# Patient Record
Sex: Female | Born: 1965 | Race: White | Hispanic: No | Marital: Single | Smoking: Former smoker
Health system: Southern US, Community
[De-identification: ages and names within clinical notes are randomized; demographics above are authoritative.]

## PROBLEM LIST (undated history)

## (undated) DIAGNOSIS — F32A Depression, unspecified: Secondary | ICD-10-CM

## (undated) DIAGNOSIS — F259 Schizoaffective disorder, unspecified: Secondary | ICD-10-CM

## (undated) DIAGNOSIS — F329 Major depressive disorder, single episode, unspecified: Secondary | ICD-10-CM

## (undated) DIAGNOSIS — F419 Anxiety disorder, unspecified: Secondary | ICD-10-CM

## (undated) DIAGNOSIS — J449 Chronic obstructive pulmonary disease, unspecified: Secondary | ICD-10-CM

## (undated) DIAGNOSIS — J45909 Unspecified asthma, uncomplicated: Secondary | ICD-10-CM

## (undated) DIAGNOSIS — F603 Borderline personality disorder: Secondary | ICD-10-CM

## (undated) DIAGNOSIS — K219 Gastro-esophageal reflux disease without esophagitis: Secondary | ICD-10-CM

## (undated) DIAGNOSIS — G3184 Mild cognitive impairment, so stated: Secondary | ICD-10-CM

## (undated) DIAGNOSIS — G809 Cerebral palsy, unspecified: Secondary | ICD-10-CM

## (undated) DIAGNOSIS — IMO0002 Reserved for concepts with insufficient information to code with codable children: Secondary | ICD-10-CM

## (undated) HISTORY — PX: CHOLECYSTECTOMY: SHX55

## (undated) HISTORY — PX: TONSILLECTOMY: SUR1361

## (undated) HISTORY — DX: Reserved for concepts with insufficient information to code with codable children: IMO0002

---

## 1997-12-29 ENCOUNTER — Encounter: Admission: RE | Admit: 1997-12-29 | Discharge: 1997-12-29 | Payer: Self-pay | Admitting: Family Medicine

## 1998-02-05 ENCOUNTER — Encounter: Admission: RE | Admit: 1998-02-05 | Discharge: 1998-02-05 | Payer: Self-pay | Admitting: Family Medicine

## 1998-03-05 ENCOUNTER — Encounter: Admission: RE | Admit: 1998-03-05 | Discharge: 1998-03-05 | Payer: Self-pay | Admitting: Family Medicine

## 1998-03-15 ENCOUNTER — Encounter: Admission: RE | Admit: 1998-03-15 | Discharge: 1998-03-15 | Payer: Self-pay | Admitting: Family Medicine

## 1998-03-23 ENCOUNTER — Encounter: Admission: RE | Admit: 1998-03-23 | Discharge: 1998-03-23 | Payer: Self-pay | Admitting: Sports Medicine

## 1998-04-21 ENCOUNTER — Encounter: Admission: RE | Admit: 1998-04-21 | Discharge: 1998-04-21 | Payer: Self-pay | Admitting: Sports Medicine

## 1998-05-13 ENCOUNTER — Encounter: Admission: RE | Admit: 1998-05-13 | Discharge: 1998-05-13 | Payer: Self-pay | Admitting: Family Medicine

## 1998-05-18 ENCOUNTER — Encounter: Admission: RE | Admit: 1998-05-18 | Discharge: 1998-05-18 | Payer: Self-pay | Admitting: Family Medicine

## 1998-05-25 ENCOUNTER — Encounter: Admission: RE | Admit: 1998-05-25 | Discharge: 1998-05-25 | Payer: Self-pay | Admitting: Family Medicine

## 1998-06-22 ENCOUNTER — Encounter: Admission: RE | Admit: 1998-06-22 | Discharge: 1998-06-22 | Payer: Self-pay | Admitting: Sports Medicine

## 1998-06-23 ENCOUNTER — Encounter: Admission: RE | Admit: 1998-06-23 | Discharge: 1998-06-23 | Payer: Self-pay | Admitting: Family Medicine

## 1998-07-15 ENCOUNTER — Encounter: Admission: RE | Admit: 1998-07-15 | Discharge: 1998-07-15 | Payer: Self-pay | Admitting: Family Medicine

## 1998-09-02 ENCOUNTER — Encounter: Admission: RE | Admit: 1998-09-02 | Discharge: 1998-09-02 | Payer: Self-pay | Admitting: Family Medicine

## 1998-09-20 ENCOUNTER — Encounter: Admission: RE | Admit: 1998-09-20 | Discharge: 1998-09-20 | Payer: Self-pay | Admitting: Family Medicine

## 1998-11-02 ENCOUNTER — Encounter: Admission: RE | Admit: 1998-11-02 | Discharge: 1998-11-02 | Payer: Self-pay | Admitting: Family Medicine

## 1998-11-10 ENCOUNTER — Encounter: Admission: RE | Admit: 1998-11-10 | Discharge: 1998-11-10 | Payer: Self-pay | Admitting: Family Medicine

## 1998-12-01 ENCOUNTER — Encounter: Admission: RE | Admit: 1998-12-01 | Discharge: 1998-12-01 | Payer: Self-pay | Admitting: Family Medicine

## 1998-12-07 ENCOUNTER — Encounter: Admission: RE | Admit: 1998-12-07 | Discharge: 1998-12-07 | Payer: Self-pay | Admitting: Family Medicine

## 1998-12-13 ENCOUNTER — Encounter: Admission: RE | Admit: 1998-12-13 | Discharge: 1998-12-13 | Payer: Self-pay | Admitting: Family Medicine

## 1999-01-14 ENCOUNTER — Encounter: Admission: RE | Admit: 1999-01-14 | Discharge: 1999-01-14 | Payer: Self-pay | Admitting: Family Medicine

## 2003-11-17 ENCOUNTER — Other Ambulatory Visit: Payer: Self-pay

## 2004-01-31 ENCOUNTER — Inpatient Hospital Stay: Payer: Self-pay | Admitting: Unknown Physician Specialty

## 2004-02-26 ENCOUNTER — Emergency Department: Payer: Self-pay | Admitting: Emergency Medicine

## 2004-05-02 ENCOUNTER — Emergency Department: Payer: Self-pay | Admitting: Emergency Medicine

## 2004-05-07 ENCOUNTER — Emergency Department: Payer: Self-pay | Admitting: Emergency Medicine

## 2004-06-05 ENCOUNTER — Emergency Department: Payer: Self-pay | Admitting: Emergency Medicine

## 2004-06-05 ENCOUNTER — Other Ambulatory Visit: Payer: Self-pay

## 2004-07-10 ENCOUNTER — Emergency Department: Payer: Self-pay | Admitting: Emergency Medicine

## 2004-08-03 ENCOUNTER — Emergency Department: Payer: Self-pay | Admitting: General Practice

## 2004-08-14 ENCOUNTER — Inpatient Hospital Stay: Payer: Self-pay | Admitting: Unknown Physician Specialty

## 2004-09-10 ENCOUNTER — Emergency Department: Payer: Self-pay | Admitting: Emergency Medicine

## 2004-09-12 ENCOUNTER — Ambulatory Visit: Payer: Self-pay | Admitting: Emergency Medicine

## 2004-09-27 ENCOUNTER — Emergency Department (HOSPITAL_COMMUNITY): Admission: EM | Admit: 2004-09-27 | Discharge: 2004-09-27 | Payer: Self-pay | Admitting: Emergency Medicine

## 2004-10-02 ENCOUNTER — Emergency Department: Payer: Self-pay | Admitting: Emergency Medicine

## 2004-10-16 ENCOUNTER — Emergency Department: Payer: Self-pay | Admitting: Emergency Medicine

## 2004-11-14 ENCOUNTER — Other Ambulatory Visit: Payer: Self-pay

## 2004-11-14 ENCOUNTER — Emergency Department: Payer: Self-pay | Admitting: Emergency Medicine

## 2004-11-28 ENCOUNTER — Emergency Department: Payer: Self-pay | Admitting: Emergency Medicine

## 2004-12-11 ENCOUNTER — Emergency Department: Payer: Self-pay | Admitting: Emergency Medicine

## 2005-01-02 ENCOUNTER — Other Ambulatory Visit: Payer: Self-pay

## 2005-01-02 ENCOUNTER — Emergency Department: Payer: Self-pay | Admitting: Internal Medicine

## 2005-01-04 ENCOUNTER — Emergency Department: Payer: Self-pay | Admitting: Emergency Medicine

## 2005-01-04 ENCOUNTER — Other Ambulatory Visit: Payer: Self-pay

## 2005-02-21 ENCOUNTER — Emergency Department: Payer: Self-pay | Admitting: Unknown Physician Specialty

## 2005-03-05 ENCOUNTER — Emergency Department: Payer: Self-pay | Admitting: Emergency Medicine

## 2005-04-20 ENCOUNTER — Emergency Department: Payer: Self-pay | Admitting: General Practice

## 2005-05-06 ENCOUNTER — Other Ambulatory Visit: Payer: Self-pay

## 2005-05-06 ENCOUNTER — Emergency Department: Payer: Self-pay | Admitting: Unknown Physician Specialty

## 2005-08-24 ENCOUNTER — Ambulatory Visit: Payer: Self-pay | Admitting: Internal Medicine

## 2005-08-28 ENCOUNTER — Ambulatory Visit: Payer: Self-pay | Admitting: Internal Medicine

## 2005-08-30 ENCOUNTER — Emergency Department: Payer: Self-pay | Admitting: Emergency Medicine

## 2005-09-13 ENCOUNTER — Emergency Department: Payer: Self-pay | Admitting: Emergency Medicine

## 2005-09-16 ENCOUNTER — Inpatient Hospital Stay: Payer: Self-pay | Admitting: Psychiatry

## 2005-11-26 ENCOUNTER — Emergency Department: Payer: Self-pay | Admitting: Unknown Physician Specialty

## 2006-01-06 ENCOUNTER — Inpatient Hospital Stay: Payer: Self-pay | Admitting: Psychiatry

## 2006-01-20 ENCOUNTER — Emergency Department: Payer: Self-pay | Admitting: Emergency Medicine

## 2006-01-20 ENCOUNTER — Other Ambulatory Visit: Payer: Self-pay

## 2006-02-25 ENCOUNTER — Emergency Department: Payer: Self-pay | Admitting: Unknown Physician Specialty

## 2006-03-06 ENCOUNTER — Emergency Department: Payer: Self-pay | Admitting: Internal Medicine

## 2006-03-09 ENCOUNTER — Emergency Department: Payer: Self-pay | Admitting: Emergency Medicine

## 2006-05-03 ENCOUNTER — Emergency Department: Payer: Self-pay | Admitting: Emergency Medicine

## 2006-05-08 ENCOUNTER — Ambulatory Visit: Payer: Self-pay | Admitting: Family Medicine

## 2006-06-08 ENCOUNTER — Emergency Department: Payer: Self-pay | Admitting: Emergency Medicine

## 2006-06-13 ENCOUNTER — Emergency Department: Payer: Self-pay | Admitting: General Practice

## 2006-06-24 ENCOUNTER — Inpatient Hospital Stay: Payer: Self-pay | Admitting: Unknown Physician Specialty

## 2006-09-02 ENCOUNTER — Ambulatory Visit: Payer: Self-pay | Admitting: Family Medicine

## 2006-09-25 ENCOUNTER — Ambulatory Visit: Payer: Self-pay | Admitting: Gastroenterology

## 2007-01-02 ENCOUNTER — Emergency Department: Payer: Self-pay | Admitting: Emergency Medicine

## 2007-01-30 ENCOUNTER — Ambulatory Visit: Payer: Self-pay | Admitting: Obstetrics and Gynecology

## 2007-02-01 ENCOUNTER — Ambulatory Visit: Payer: Self-pay | Admitting: Otolaryngology

## 2007-02-05 ENCOUNTER — Other Ambulatory Visit: Payer: Self-pay

## 2007-02-05 ENCOUNTER — Ambulatory Visit: Payer: Self-pay | Admitting: Otolaryngology

## 2007-02-07 ENCOUNTER — Ambulatory Visit: Payer: Self-pay | Admitting: Otolaryngology

## 2007-03-21 ENCOUNTER — Emergency Department: Payer: Self-pay | Admitting: Emergency Medicine

## 2007-03-21 ENCOUNTER — Other Ambulatory Visit: Payer: Self-pay

## 2007-04-12 ENCOUNTER — Emergency Department: Payer: Self-pay | Admitting: Emergency Medicine

## 2007-04-17 ENCOUNTER — Ambulatory Visit: Payer: Self-pay | Admitting: Otolaryngology

## 2007-04-18 ENCOUNTER — Emergency Department: Payer: Self-pay | Admitting: Emergency Medicine

## 2007-04-23 ENCOUNTER — Emergency Department: Payer: Self-pay | Admitting: Emergency Medicine

## 2007-05-19 ENCOUNTER — Other Ambulatory Visit: Payer: Self-pay

## 2007-05-19 ENCOUNTER — Emergency Department: Payer: Self-pay | Admitting: Emergency Medicine

## 2007-05-25 ENCOUNTER — Emergency Department: Payer: Self-pay | Admitting: Emergency Medicine

## 2007-06-03 ENCOUNTER — Other Ambulatory Visit: Payer: Self-pay

## 2007-06-03 ENCOUNTER — Emergency Department: Payer: Self-pay | Admitting: Emergency Medicine

## 2007-06-17 ENCOUNTER — Emergency Department: Payer: Self-pay | Admitting: Emergency Medicine

## 2007-07-11 ENCOUNTER — Other Ambulatory Visit: Payer: Self-pay

## 2007-07-11 ENCOUNTER — Inpatient Hospital Stay (HOSPITAL_COMMUNITY): Admission: AD | Admit: 2007-07-11 | Discharge: 2007-07-15 | Payer: Self-pay | Admitting: *Deleted

## 2007-07-16 ENCOUNTER — Ambulatory Visit: Payer: Self-pay | Admitting: *Deleted

## 2007-08-08 ENCOUNTER — Other Ambulatory Visit: Payer: Self-pay

## 2007-08-08 ENCOUNTER — Emergency Department: Payer: Self-pay | Admitting: Emergency Medicine

## 2007-08-10 ENCOUNTER — Emergency Department: Payer: Self-pay | Admitting: Emergency Medicine

## 2007-08-15 ENCOUNTER — Other Ambulatory Visit: Payer: Self-pay

## 2007-08-15 ENCOUNTER — Emergency Department: Payer: Self-pay | Admitting: Emergency Medicine

## 2007-08-20 ENCOUNTER — Emergency Department: Payer: Self-pay | Admitting: Emergency Medicine

## 2007-08-20 ENCOUNTER — Other Ambulatory Visit: Payer: Self-pay

## 2007-08-23 ENCOUNTER — Emergency Department: Payer: Self-pay | Admitting: Emergency Medicine

## 2007-08-25 ENCOUNTER — Inpatient Hospital Stay: Payer: Self-pay | Admitting: Unknown Physician Specialty

## 2007-09-08 ENCOUNTER — Emergency Department: Payer: Self-pay | Admitting: Emergency Medicine

## 2007-09-12 ENCOUNTER — Emergency Department: Payer: Self-pay | Admitting: Emergency Medicine

## 2007-10-10 ENCOUNTER — Ambulatory Visit: Payer: Self-pay | Admitting: Family Medicine

## 2007-10-22 ENCOUNTER — Ambulatory Visit: Payer: Self-pay | Admitting: General Surgery

## 2007-10-25 ENCOUNTER — Ambulatory Visit: Payer: Self-pay | Admitting: General Surgery

## 2007-11-30 ENCOUNTER — Emergency Department: Payer: Self-pay | Admitting: Emergency Medicine

## 2007-11-30 ENCOUNTER — Other Ambulatory Visit: Payer: Self-pay

## 2007-12-14 ENCOUNTER — Other Ambulatory Visit: Payer: Self-pay

## 2007-12-14 ENCOUNTER — Emergency Department: Payer: Self-pay | Admitting: Emergency Medicine

## 2008-01-08 ENCOUNTER — Emergency Department: Payer: Self-pay | Admitting: Emergency Medicine

## 2008-01-08 ENCOUNTER — Other Ambulatory Visit: Payer: Self-pay

## 2008-01-12 ENCOUNTER — Emergency Department: Payer: Self-pay | Admitting: Emergency Medicine

## 2008-02-07 ENCOUNTER — Emergency Department: Payer: Self-pay | Admitting: Emergency Medicine

## 2008-05-04 ENCOUNTER — Emergency Department: Payer: Self-pay

## 2008-05-06 ENCOUNTER — Ambulatory Visit: Payer: Self-pay | Admitting: Otolaryngology

## 2008-08-14 ENCOUNTER — Emergency Department: Payer: Self-pay | Admitting: Emergency Medicine

## 2008-09-02 ENCOUNTER — Emergency Department: Payer: Self-pay | Admitting: Internal Medicine

## 2008-09-12 ENCOUNTER — Emergency Department: Payer: Self-pay | Admitting: Emergency Medicine

## 2008-11-26 ENCOUNTER — Ambulatory Visit: Payer: Self-pay | Admitting: Gastroenterology

## 2008-12-16 ENCOUNTER — Ambulatory Visit: Payer: Self-pay | Admitting: Gastroenterology

## 2008-12-18 ENCOUNTER — Emergency Department: Payer: Self-pay | Admitting: Emergency Medicine

## 2009-02-16 ENCOUNTER — Emergency Department: Payer: Self-pay | Admitting: Emergency Medicine

## 2009-02-20 ENCOUNTER — Emergency Department: Payer: Self-pay | Admitting: Unknown Physician Specialty

## 2009-02-21 ENCOUNTER — Emergency Department: Payer: Self-pay | Admitting: Emergency Medicine

## 2009-03-04 ENCOUNTER — Emergency Department: Payer: Self-pay | Admitting: Emergency Medicine

## 2009-04-23 ENCOUNTER — Ambulatory Visit: Payer: Self-pay | Admitting: Gastroenterology

## 2009-07-19 ENCOUNTER — Ambulatory Visit: Payer: Self-pay | Admitting: Gastroenterology

## 2009-08-04 ENCOUNTER — Emergency Department: Payer: Self-pay | Admitting: Internal Medicine

## 2009-11-16 ENCOUNTER — Ambulatory Visit: Payer: Self-pay | Admitting: Internal Medicine

## 2010-08-30 NOTE — H&P (Signed)
[name], [name] NO.:  [id number]   MEDICAL RECORD NO.:  [id number]          PATIENT TYPE:  IPS   LOCATION:  0302                          FACILITY:  [facility]   PHYSICIAN:  Jasmine Pang, M.D. DATE OF BIRTH:  1965/05/14   DATE OF ADMISSION:  07/11/2007  DATE OF DISCHARGE:                       PSYCHIATRIC ADMISSION ASSESSMENT   HISTORY OF PRESENT ILLNESS:  The patient presents with a history of  depression, anxiety, and panic attacks.  The patient today states that  is what the problem was.  She states that she did have suicidal  thoughts with plan to overdose.  The patient reports her stressor was  that she had gotten upset because she was unable to go to school because  her case manager had to take a test to be a school bus driver and was  unable to transport her to school where patient is trying to get her  GED.  She states that otherwise she is doing well.  Her sleep and  appetite has been satisfactory.  She denies any hallucinations.  She has  been compliant with the medications and denies any drug use.   PAST PSYCHIATRIC HISTORY:  This is her first admission to [facility]  The patient was hospitalized at [facility] in the past.  She has had a case manager whose name is Manuela Neptune at phone number [phone] in [place].   SOCIAL HISTORY:  He is a 42-year female who lives with her fiancee who  she states is very supportive.  The patient is obtaining her GED.  The  patient is on disability.   FAMILY HISTORY:  Denies.   ALCOHOL AND DRUG HISTORY:  The patient denies any alcohol or drug use.  She is a nonsmoker.   PRIMARY CARE [facility]:  Dr. Lucina Mellow.   MEDICAL PROBLEMS:  She reports a history of asthma.   MEDICATIONS:  1. Abilify 50 mg daily.  2. Celexa 60 mg daily.  3. Lorazepam taking 1 daily p.r.n., 0.5 to 1 mg again daily p.r.n.   DRUG ALLERGIES:  PENICILLIN.  She reports problems with her stomach  getting upset.   PHYSICAL  EXAMINATION:  This is an overweight, unkempt, malodorous  female.  She is currently dressed in a hospital gown.  She was fully  assessed at [facility].  Temperature is 97.7, 98 heart rate, 20  respirations, blood pressure is 123/74.  She is 5 feet 4 inches tall,  215 pounds.   LABORATORY DATA:  Shows a white count of 11.3.  Urinalysis negative.  Alcohol level less than 5, potassium of 3.3, and her drug screen is  positive for benzodiazepines.   MENTAL STATUS:  Fully alert, cooperative female.  Seems somewhat  restless in a chair.  She has good eye contact.  Speech:  Mumbles every  now and had to reask questions.  Mood is neutral.  The patient's affect  is flat.  Thought processes are coherent.  No evidence of any psychotic  symptoms.  No suicidal or homicidal thoughts.  Cognitive function  intact.  She seems well aware of her self, situation,  and place.  Judgment insight is fair.  She seems to have limited cognitive  functioning.   AXIS I:  Mood disorder, not otherwise specified.  AXIS II:  Deferred.  AXIS III:  Asthma per patient history.  AXIS IV:  Problems with education, other psychosocial problems.  AXIS V:  Current is 40.   PLAN:  Is to contract for safety.  We will stabilize mood and thinking.  We will resume patient's Celexa, Abilify, and lorazepam having twice a  day p.r.n. dosing.  Will contact case manager for any concerns of  discharge planning.  Consider family session with her support group.  Her tentative length of stay is three to four days.      Landry Corporal, N.P.      Jasmine Pang, M.D.  Electronically Signed    JO/MEDQ  D:  07/12/2007  T:  07/13/2007  Job:  [id number]

## 2010-09-02 NOTE — Discharge Summary (Signed)
NAMEEVERLEE, QUAKENBUSH NO.:  [id number]   MEDICAL RECORD NO.:  [id number]          PATIENT TYPE:  IPS   LOCATION:  0302                          FACILITY:  [facility]   PHYSICIAN:  Jasmine Pang, M.D. DATE OF BIRTH:  13-May-1965   DATE OF ADMISSION:  07/11/2007  DATE OF DISCHARGE:  07/15/2007                               DISCHARGE SUMMARY   IDENTIFICATION:  The patient is a 45 year old female who was admitted on  July 11, 2007.   HISTORY OF PRESENT ILLNESS:  The patient presents with a history of  depression, anxiety, and panic attacks.  She states today that this is  what the main problem was.  She states that she did have suicidal  thoughts with plan to overdose.  The patient reports her stressor was  that she had gotten upset, because she was unable to go the school,  because her case manager had to take a test to be a school bus driver  and was unable to transport her to school where the patient was trying  to get her GED.  She states that otherwise she is doing well.  Her sleep  and appetite have been satisfactory.  She denies any hallucinations.  She has been compliant with medications and denies any drug use.   PAST PSYCHIATRIC HISTORY:  This is her first admission to [facility].  The patient was hospitalized at [facility] in the past.   FAMILY HISTORY:  Denies.   ALCOHOL AND DRUG HISTORY:  The patient denies any alcohol or drug use.  She is a nonsmoker.   MEDICAL PROBLEMS:  Reports a history of asthma.   MEDICATIONS:  1. Abilify 50 mg daily.  2. Celexa 60 mg daily.  3. Lorazepam 0.5 mg to 1 mg p.r.n. anxiety daily.   DRUG ALLERGIES:  PENICILLIN.   The patient reports problems with her stomach getting upset.   PHYSICAL FINDINGS:  There were no acute physical or medical problems  noted.  She was fully assessed at the [facility] ED.   LABORATORY DATA:  White blood cell count was 11.3.  Urinalysis was  negative.  Alcohol level less  than 5.  Potassium was 30.3.  UDS positive  for benzodiazepines.   HOSPITAL COURSE:  Upon admission, the patient was restarted on her  Abilify 15 mg p.o. q.day.  She was also placed on trazodone 50 mg p.o.  q.h.s. p.r.n. insomnia.  She was started on Protonix 40 mg p.o. q.day  and albuterol inhaler for rescue shortness of breath.  She was placed on  Ativan 0.5 mg p.o. b.i.d. p.r.n. anxiety and Celexa 60 mg p.o. q.day.  In sessions with me, the patient was reserved, but cooperative.  She  states that she was feeling suicidal.  She was upset at a case worker.  She has been feeling depressed and anxious.  She sees a psychiatrist,  Dr. Elesa Massed, in [place].  She has been hospitalized before at [facility] in [place].  She states, she has a supportive  fiance with whom she lives.  As hospitalization progressed, she became  less depressed and less anxious.  She tolerated her medications well  with no significant side effects.  She participated appropriately in  unit, therapeutic groups, and activities.  She was focused on wanting to  go home.  She discussed how her schedule has been changed by her  outpatient case manager and she does not like this.  On July 15, 2007,  the patient was anxious to go home.  Sleep was good, appetite was good.  Mood was euthymic.  Affect consistent with mood.  There was no suicidal  or homicidal ideation.  No thoughts of self-injurious behavior.  No  auditory or visual hallucinations.  No paranoia or delusions.  Thoughts  were logical and goal-directed.  Thought content, no predominant theme.  Cognitive was grossly back to baseline.  It was felt the patient was  safe for discharge.   DISCHARGE DIAGNOSES:  Axis I:  Mood disorder, not otherwise specified.  Axis II:  None.  Axis III:  Asthma.  Axis IV:  Moderate (problems with education, other psychosocial  problems, burden with psychiatric illness).  Axis V:  Global assessment of functioning  was 50 upon discharge.  GAF  was 40 upon admission.  GAF highest past year was 60-65.   DISCHARGE PLANS:  There was no specific activity level or dietary  restrictions.   POSTHOSPITAL CARE PLANS:  The patient will see Dr. Elesa Massed at the [facility] on July 17, 2007, at 10:30 a.m.  She will continue to  work with her community support case Production designer, theatre/television/film, [employer].   DISCHARGE MEDICATIONS:  1. Albuterol p.r.n. for asthma as directed.  2. Abilify 15 mg daily.  3. Celexa 60 mg daily.  4. Protonix 40 mg daily.      Jasmine Pang, M.D.  Electronically Signed     BHS/MEDQ  D:  08/11/2007  T:  08/12/2007  Job:  [id number]

## 2010-10-25 ENCOUNTER — Ambulatory Visit: Payer: Self-pay | Admitting: Internal Medicine

## 2010-11-27 ENCOUNTER — Emergency Department: Payer: Self-pay | Admitting: Emergency Medicine

## 2011-01-09 LAB — BASIC METABOLIC PANEL
CO2: 26
Chloride: 108
Creatinine, Ser: 0.75
GFR calc Af Amer: >60
Potassium: 3.3 — ABNORMAL LOW
Sodium: 140

## 2011-01-09 LAB — URINALYSIS, ROUTINE W REFLEX MICROSCOPIC
Bilirubin Urine: NEGATIVE
Glucose, UA: NEGATIVE
Hgb urine dipstick: NEGATIVE
Specific Gravity, Urine: 1.03
Urobilinogen, UA: 0.2
pH: 5.5

## 2011-01-09 LAB — CBC
Hemoglobin: 14.2
MCV: 92.5
RBC: 4.69
WBC: 11.3 — ABNORMAL HIGH

## 2011-01-09 LAB — DIFFERENTIAL
Eosinophils Absolute: 0.2
Lymphs Abs: 2.8
Monocytes Absolute: 0.7
Monocytes Relative: 6
Neutrophils Relative %: 66

## 2011-01-09 LAB — ETHANOL: Alcohol, Ethyl (B): <5

## 2011-01-09 LAB — RAPID URINE DRUG SCREEN, HOSP PERFORMED
Barbiturates: NOT DETECTED
Opiates: NOT DETECTED

## 2011-06-02 ENCOUNTER — Emergency Department: Payer: Self-pay | Admitting: Emergency Medicine

## 2011-07-04 ENCOUNTER — Emergency Department: Payer: Self-pay | Admitting: Emergency Medicine

## 2011-07-04 LAB — CK TOTAL AND CKMB (NOT AT ARMC)
CK, Total: 155 U/L (ref 21–215)
CK, Total: 148 U/L (ref 21–215)
CK-MB: 1.4 ng/mL (ref 0.5–3.6)
CK-MB: 1.2 ng/mL (ref 0.5–3.6)

## 2011-07-04 LAB — BASIC METABOLIC PANEL
Calcium, Total: 8.7 mg/dL (ref 8.5–10.1)
Chloride: 110 mmol/L — ABNORMAL HIGH (ref 98–107)
Co2: 23 mmol/L (ref 21–32)
Creatinine: 0.59 mg/dL — ABNORMAL LOW (ref 0.60–1.30)
EGFR (Non-African Amer.): >60
Potassium: 4.1 mmol/L (ref 3.5–5.1)
Sodium: 147 mmol/L — ABNORMAL HIGH (ref 136–145)

## 2011-07-04 LAB — CBC
HCT: 39.5 % (ref 35.0–47.0)
MCH: 32.2 pg (ref 26.0–34.0)
Platelet: 260 10*3/uL (ref 150–440)
RBC: 4.14 10*6/uL (ref 3.80–5.20)
RDW: 13.2 % (ref 11.5–14.5)

## 2011-07-04 LAB — TROPONIN I: Troponin-I: <0.02 ng/mL

## 2011-08-09 LAB — COMPREHENSIVE METABOLIC PANEL
Albumin: 4.1 g/dL (ref 3.4–5.0)
Alkaline Phosphatase: 67 U/L (ref 50–136)
Chloride: 107 mmol/L (ref 98–107)
Co2: 25 mmol/L (ref 21–32)
Creatinine: 0.67 mg/dL (ref 0.60–1.30)
EGFR (African American): >60
EGFR (Non-African Amer.): >60
SGOT(AST): 33 U/L (ref 15–37)
Total Protein: 7.9 g/dL (ref 6.4–8.2)

## 2011-08-09 LAB — CBC
HCT: 41.1 % (ref 35.0–47.0)
HGB: 13.7 g/dL (ref 12.0–16.0)
MCHC: 33.4 g/dL (ref 32.0–36.0)
RBC: 4.34 10*6/uL (ref 3.80–5.20)
RDW: 13.3 % (ref 11.5–14.5)
WBC: 9.3 10*3/uL (ref 3.6–11.0)

## 2011-08-10 ENCOUNTER — Inpatient Hospital Stay: Payer: Self-pay | Admitting: Psychiatry

## 2011-08-10 DIAGNOSIS — R9431 Abnormal electrocardiogram [ECG] [EKG]: Secondary | ICD-10-CM

## 2011-08-10 LAB — DRUG SCREEN, URINE
Amphetamines, Ur Screen: NEGATIVE (ref ?–1000)
Barbiturates, Ur Screen: NEGATIVE (ref ?–200)
Cannabinoid 50 Ng, Ur ~~LOC~~: NEGATIVE (ref ?–50)
MDMA (Ecstasy)Ur Screen: NEGATIVE (ref ?–500)
Phencyclidine (PCP) Ur S: NEGATIVE (ref ?–25)
Tricyclic, Ur Screen: NEGATIVE (ref ?–1000)

## 2011-08-11 LAB — FOLATE: Folic Acid: 19 ng/mL (ref 3.1–100.0)

## 2011-10-24 ENCOUNTER — Inpatient Hospital Stay: Payer: Self-pay | Admitting: Psychiatry

## 2011-10-24 LAB — DRUG SCREEN, URINE
Amphetamines, Ur Screen: NEGATIVE (ref ?–1000)
Barbiturates, Ur Screen: NEGATIVE (ref ?–200)
Methadone, Ur Screen: NEGATIVE (ref ?–300)
Opiate, Ur Screen: NEGATIVE (ref ?–300)
Phencyclidine (PCP) Ur S: NEGATIVE (ref ?–25)

## 2011-10-24 LAB — URINALYSIS, COMPLETE
Ketone: NEGATIVE
Nitrite: NEGATIVE
Ph: 5 (ref 4.5–8.0)
Protein: NEGATIVE
RBC,UR: 1 /HPF (ref 0–5)
Specific Gravity: 1.019 (ref 1.003–1.030)
Squamous Epithelial: 12

## 2011-10-24 LAB — COMPREHENSIVE METABOLIC PANEL
Albumin: 3.6 g/dL (ref 3.4–5.0)
Alkaline Phosphatase: 81 U/L (ref 50–136)
BUN: 11 mg/dL (ref 7–18)
Calcium, Total: 9 mg/dL (ref 8.5–10.1)
EGFR (African American): >60
EGFR (Non-African Amer.): >60
Glucose: 100 mg/dL — ABNORMAL HIGH (ref 65–99)
Osmolality: 284 (ref 275–301)
Potassium: 3.6 mmol/L (ref 3.5–5.1)
SGOT(AST): 34 U/L (ref 15–37)
SGPT (ALT): 44 U/L
Total Protein: 7.6 g/dL (ref 6.4–8.2)

## 2011-10-24 LAB — CBC
HCT: 40.6 % (ref 35.0–47.0)
HGB: 13.8 g/dL (ref 12.0–16.0)
MCHC: 33.9 g/dL (ref 32.0–36.0)
MCV: 94 fL (ref 80–100)
RBC: 4.32 10*6/uL (ref 3.80–5.20)
RDW: 13.6 % (ref 11.5–14.5)

## 2011-10-24 LAB — ETHANOL
Ethanol %: <0.003 % (ref 0.000–0.080)
Ethanol: <3 mg/dL

## 2011-10-25 LAB — BEHAVIORAL MEDICINE 1 PANEL
Albumin: 3.4 g/dL (ref 3.4–5.0)
Alkaline Phosphatase: 85 U/L (ref 50–136)
Anion Gap: 10 (ref 7–16)
BUN: 16 mg/dL (ref 7–18)
Basophil #: 0.1 10*3/uL (ref 0.0–0.1)
Bilirubin,Total: 0.3 mg/dL (ref 0.2–1.0)
Calcium, Total: 8.9 mg/dL (ref 8.5–10.1)
Co2: 24 mmol/L (ref 21–32)
Creatinine: 0.65 mg/dL (ref 0.60–1.30)
Eosinophil #: 0.3 10*3/uL (ref 0.0–0.7)
Eosinophil %: 3.1 %
Glucose: 94 mg/dL (ref 65–99)
Lymphocyte #: 2.4 10*3/uL (ref 1.0–3.6)
Monocyte %: 10.2 %
Neutrophil %: 60.8 %
Osmolality: 282 (ref 275–301)
Platelet: 291 10*3/uL (ref 150–440)
Potassium: 3.9 mmol/L (ref 3.5–5.1)
RBC: 4.19 10*6/uL (ref 3.80–5.20)
SGOT(AST): 27 U/L (ref 15–37)
Sodium: 141 mmol/L (ref 136–145)
Thyroid Stimulating Horm: 6.48 u[IU]/mL — ABNORMAL HIGH
WBC: 9.7 10*3/uL (ref 3.6–11.0)

## 2012-05-27 ENCOUNTER — Ambulatory Visit: Payer: Self-pay | Admitting: Internal Medicine

## 2013-01-23 ENCOUNTER — Encounter (HOSPITAL_COMMUNITY): Payer: Self-pay | Admitting: Emergency Medicine

## 2013-01-23 ENCOUNTER — Emergency Department (HOSPITAL_COMMUNITY): Payer: No Typology Code available for payment source

## 2013-01-23 ENCOUNTER — Emergency Department (HOSPITAL_COMMUNITY)
Admission: EM | Admit: 2013-01-23 | Discharge: 2013-01-23 | Disposition: A | Discharge status: Home / Self Care | Payer: No Typology Code available for payment source | Attending: Emergency Medicine | Admitting: Emergency Medicine

## 2013-01-23 DIAGNOSIS — J3489 Other specified disorders of nose and nasal sinuses: Secondary | ICD-10-CM | POA: Diagnosis not present

## 2013-01-23 DIAGNOSIS — Y939 Activity, unspecified: Secondary | ICD-10-CM | POA: Insufficient documentation

## 2013-01-23 DIAGNOSIS — Y92410 Unspecified street and highway as the place of occurrence of the external cause: Secondary | ICD-10-CM | POA: Insufficient documentation

## 2013-01-23 DIAGNOSIS — S139XXA Sprain of joints and ligaments of unspecified parts of neck, initial encounter: Secondary | ICD-10-CM | POA: Diagnosis not present

## 2013-01-23 DIAGNOSIS — S0990XA Unspecified injury of head, initial encounter: Secondary | ICD-10-CM | POA: Diagnosis not present

## 2013-01-23 DIAGNOSIS — J45909 Unspecified asthma, uncomplicated: Secondary | ICD-10-CM | POA: Diagnosis not present

## 2013-01-23 DIAGNOSIS — S46909A Unspecified injury of unspecified muscle, fascia and tendon at shoulder and upper arm level, unspecified arm, initial encounter: Secondary | ICD-10-CM | POA: Insufficient documentation

## 2013-01-23 DIAGNOSIS — R059 Cough, unspecified: Secondary | ICD-10-CM | POA: Insufficient documentation

## 2013-01-23 DIAGNOSIS — S4980XA Other specified injuries of shoulder and upper arm, unspecified arm, initial encounter: Secondary | ICD-10-CM | POA: Insufficient documentation

## 2013-01-23 DIAGNOSIS — S0993XA Unspecified injury of face, initial encounter: Secondary | ICD-10-CM | POA: Diagnosis present

## 2013-01-23 DIAGNOSIS — R05 Cough: Secondary | ICD-10-CM | POA: Insufficient documentation

## 2013-01-23 DIAGNOSIS — Z880 Allergy status to penicillin: Secondary | ICD-10-CM | POA: Insufficient documentation

## 2013-01-23 DIAGNOSIS — S161XXA Strain of muscle, fascia and tendon at neck level, initial encounter: Secondary | ICD-10-CM

## 2013-01-23 HISTORY — DX: Unspecified asthma, uncomplicated: J45.909

## 2013-01-23 MED ORDER — HYDROCODONE-ACETAMINOPHEN 5-325 MG PO TABS
1.0000 | ORAL_TABLET | Freq: Once | ORAL | Status: DC
  Filled 2013-01-23: qty 1

## 2013-01-23 MED ORDER — METHOCARBAMOL 500 MG PO TABS: 500.0000 mg | ORAL_TABLET | Freq: Three times a day (TID) | ORAL | Status: DC | PRN

## 2013-01-23 MED ORDER — NAPROXEN 500 MG PO TABS: 500.0000 mg | ORAL_TABLET | Freq: Two times a day (BID) | ORAL | Status: DC

## 2013-01-23 NOTE — ED Notes (Signed)
Patient with no complaints at this time. Respirations even and unlabored. Skin warm/dry. Discharge instructions reviewed with patient at this time. Patient given opportunity to voice concerns/ask questions. Patient discharged at this time and left Emergency Department with steady gait.   

## 2013-01-23 NOTE — ED Notes (Signed)
Patient/family refused hydrocodone due to "it will make her sleepy."
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
## 2013-10-10 ENCOUNTER — Emergency Department: Payer: Self-pay | Admitting: Emergency Medicine

## 2013-10-10 LAB — CBC
HCT: 39.4 % (ref 35.0–47.0)
HGB: 13.6 g/dL (ref 12.0–16.0)
MCH: 33 pg (ref 26.0–34.0)
MCHC: 34.6 g/dL (ref 32.0–36.0)
MCV: 95 fL (ref 80–100)
Platelet: 292 10*3/uL (ref 150–440)
RBC: 4.14 10*6/uL (ref 3.80–5.20)
RDW: 13.2 % (ref 11.5–14.5)
WBC: 9.8 10*3/uL (ref 3.6–11.0)

## 2013-10-10 LAB — COMPREHENSIVE METABOLIC PANEL
ANION GAP: 5 — AB (ref 7–16)
Albumin: 3.7 g/dL (ref 3.4–5.0)
Alkaline Phosphatase: 96 U/L
BUN: 16 mg/dL (ref 7–18)
Bilirubin,Total: 0.3 mg/dL (ref 0.2–1.0)
CALCIUM: 9.3 mg/dL (ref 8.5–10.1)
Chloride: 109 mmol/L — ABNORMAL HIGH (ref 98–107)
Co2: 28 mmol/L (ref 21–32)
Creatinine: 0.72 mg/dL (ref 0.60–1.30)
EGFR (African American): >60
Glucose: 101 mg/dL — ABNORMAL HIGH (ref 65–99)
Osmolality: 284 (ref 275–301)
Potassium: 3.5 mmol/L (ref 3.5–5.1)
SGOT(AST): 19 U/L (ref 15–37)
SGPT (ALT): 26 U/L (ref 12–78)
Sodium: 142 mmol/L (ref 136–145)
Total Protein: 7.5 g/dL (ref 6.4–8.2)

## 2013-10-10 LAB — SALICYLATE LEVEL: Salicylates, Serum: 2.9 mg/dL — ABNORMAL HIGH

## 2013-10-10 LAB — ACETAMINOPHEN LEVEL: Acetaminophen: <2 ug/mL

## 2013-10-10 LAB — ETHANOL: Ethanol %: <0.003 % (ref 0.000–0.080)

## 2013-12-19 ENCOUNTER — Emergency Department: Payer: Self-pay | Admitting: Emergency Medicine

## 2013-12-19 LAB — BASIC METABOLIC PANEL
Anion Gap: 7 (ref 7–16)
BUN: 11 mg/dL (ref 7–18)
CALCIUM: 9.1 mg/dL (ref 8.5–10.1)
CHLORIDE: 109 mmol/L — AB (ref 98–107)
Co2: 24 mmol/L (ref 21–32)
Creatinine: 0.79 mg/dL (ref 0.60–1.30)
EGFR (African American): >60
Glucose: 91 mg/dL (ref 65–99)
OSMOLALITY: 278 (ref 275–301)
Potassium: 3.4 mmol/L — ABNORMAL LOW (ref 3.5–5.1)
Sodium: 140 mmol/L (ref 136–145)

## 2013-12-19 LAB — CBC
HCT: 36.7 % (ref 35.0–47.0)
HGB: 12.5 g/dL (ref 12.0–16.0)
MCH: 32.4 pg (ref 26.0–34.0)
MCHC: 34.1 g/dL (ref 32.0–36.0)
MCV: 95 fL (ref 80–100)
Platelet: 241 10*3/uL (ref 150–440)
RBC: 3.87 10*6/uL (ref 3.80–5.20)
RDW: 13.3 % (ref 11.5–14.5)
WBC: 6.1 10*3/uL (ref 3.6–11.0)

## 2013-12-19 LAB — TROPONIN I: Troponin-I: <0.02 ng/mL

## 2014-04-02 ENCOUNTER — Emergency Department: Payer: Self-pay | Admitting: Student

## 2014-04-02 LAB — COMPREHENSIVE METABOLIC PANEL
ALBUMIN: 3.2 g/dL — AB (ref 3.4–5.0)
Alkaline Phosphatase: 61 U/L
Anion Gap: 7 (ref 7–16)
BUN: 13 mg/dL (ref 7–18)
Bilirubin,Total: 0.2 mg/dL (ref 0.2–1.0)
CALCIUM: 8.4 mg/dL — AB (ref 8.5–10.1)
Chloride: 110 mmol/L — ABNORMAL HIGH (ref 98–107)
Co2: 28 mmol/L (ref 21–32)
Creatinine: 0.75 mg/dL (ref 0.60–1.30)
EGFR (African American): >60
EGFR (Non-African Amer.): >60
Glucose: 92 mg/dL (ref 65–99)
OSMOLALITY: 288 (ref 275–301)
Potassium: 3.7 mmol/L (ref 3.5–5.1)
SGOT(AST): 32 U/L (ref 15–37)
SGPT (ALT): 46 U/L
Sodium: 145 mmol/L (ref 136–145)
Total Protein: 6.8 g/dL (ref 6.4–8.2)

## 2014-04-02 LAB — CBC
HCT: 37.4 % (ref 35.0–47.0)
HGB: 12.4 g/dL (ref 12.0–16.0)
MCH: 32 pg (ref 26.0–34.0)
MCHC: 33.1 g/dL (ref 32.0–36.0)
MCV: 97 fL (ref 80–100)
Platelet: 283 10*3/uL (ref 150–440)
RBC: 3.87 10*6/uL (ref 3.80–5.20)
RDW: 13.7 % (ref 11.5–14.5)
WBC: 8.1 10*3/uL (ref 3.6–11.0)

## 2014-04-02 LAB — PROTIME-INR
INR: 1
Prothrombin Time: 13.1 secs (ref 11.5–14.7)

## 2014-04-02 LAB — APTT: ACTIVATED PTT: 28.7 s (ref 23.6–35.9)

## 2014-04-02 LAB — HCG, QUANTITATIVE, PREGNANCY

## 2014-04-02 LAB — TROPONIN I: Troponin-I: <0.02 ng/mL

## 2014-07-30 ENCOUNTER — Ambulatory Visit
Admit: 2014-07-30 | Disposition: A | Discharge status: Home / Self Care | Payer: Self-pay | Attending: Internal Medicine | Admitting: Internal Medicine

## 2014-08-04 NOTE — Discharge Summary (Signed)
PATIENT NAME:  [name], [name] MR#:  [id number] DATE OF BIRTH:  08-05-1965  DATE OF ADMISSION:  10/24/2011 DATE OF DISCHARGE:  10/27/2011  HISTORY OF PRESENT ILLNESS:  [name] presented to the Emergency Department with depressed mood and suicidal thoughts. She had been experiencing over two weeks of  anhedonia, poor concentration, and thoughts of hopelessness and helplessness, as well as disturbance of appetite, insomnia, and suicidal thoughts. There were no precipitating factors identified. She was taking her psychotropic medication including Abilify 10 mg daily, buspirone 10 mg b.i.d., and clonazepam 0.5 mg b.i.d.   See the admission dictation.   ANCILLARY CLINICAL DATA: The patient was continued on her Nexium 40 mg daily for her gastroesophageal reflux.  HOSPITAL COURSE: [name] was admitted to the inpatient behavioral health unit and underwent milieu and group psychotherapy. She was given Abilify 10 mg at bedtime for anti-depression augmentation and antipsychosis. She was continued on buspirone 15 mg 2 times a day. This medication had been utilized as a primary antidepressant for her after medications other than BuSpar had either failed or resulted in intolerable adverse effects.   She was continued on Klonopin at 0.5 mg b.i.d. for antianxiety, which was effective.     She participated well in milieu and group psychotherapy. She progressively improved.   CONDITION ON DISCHARGE: By 07/12 [name] has normal mood and interests. She has constructive future goals and intact hope. She is having no adverse medication effects. She is socially appropriate.   MENTAL STATUS EXAM UPON DISCHARGE:  [name] is alert. Her eye contact is good. Her abstraction is intact. She is oriented to all spheres. Her memory is intact to immediate, recent, and remote. Her fund of knowledge, use of language, and intelligence are normal. Her speech involves normal rate and prosody. Thought process is logical,  coherent, and goal-directed. No looseness of associations. Thought content: No thoughts of harming herself or others. No delusions or hallucinations. Affect broad and appropriate. Mood within normal limits. Insight intact. Judgment intact.   DISCHARGE DIAGNOSES:  AXIS I:  1. Major depressive disorder, recurrent, in clinical remission.  The patient had some brief reactive depressive symptoms which quickly resolved.  2. Anxiety disorder, not otherwise specified.   AXIS II: Deferred.   AXIS III: Gastroesophageal reflux.  AXIS IV: Primary support group.   AXIS V: 55.   [name] is not at risk to harm himself or others. She agrees to call emergency services immediately for any thoughts of harming herself, thoughts of harming others, or distress. She agrees to not drive if drowsy.   DISCHARGE MEDICATIONS:  1. Clonazepam 0.5 mg, 1 b.i.d.  2. Milk of Magnesia 8% oral suspension, 30 mL at bedtime for constipation.  3. Buspirone 15 mg 2 times a day.  4. Nexium 40 mg daily. 5. Abilify 10 mg daily.  6. Albuterol inhaler 2 puffs q. 6 hours p.r.n. wheezing.  DIET: Regular.   ACTIVITY: Routine.   FOLLOWUP:  1. Dr. Cherylann RatelLateef,  [facility] on 10/31/2011 at 9:30. 2. Roe RutherfordLindsay Tucker with [facility] visiting at the group home.    3. Dan EuropeKelly Morgan [facility].  Tresa EndoKelly will be visiting in the group home.     ____________________________ Adelene AmasJames S. Nuh Lipton, MD jsw:bjt D: 11/02/2011 22:53:06 ET T: 11/03/2011 07:54:58 ET JOB#: [id number]  cc: Adelene AmasJames S. Darsha Zumstein, MD, <Dictator> Lester CarolinaJAMES S Glendia Olshefski MD ELECTRONICALLY SIGNED 11/07/2011 23:31

## 2014-08-09 NOTE — H&P (Signed)
PATIENT NAME:  AMI, MALLY MR#:  [id number] DATE OF BIRTH:  06-20-1965  DATE OF ADMISSION:  10/24/2011  CHIEF COMPLAINT AND IDENTIFYING DATA: Ms. Cherie Lasalle is a 49 year old female presenting with depressed mood and suicidal thoughts.   HISTORY OF PRESENT ILLNESS: Ms. Amaral has been experiencing depression for over two weeks including anhedonia, poor concentration, thoughts of hopelessness, helplessness, disturbance of appetite, insomnia, and suicidal thoughts.   She does not describe any precipitating factors. She has not been able to use any psychosocial means to decrease her symptoms.   She did state that a surge of suicidal ideation came on to her on the day of presentation which prompted her to be taken to the Emergency Room. She does not have any hallucinations. Her orientation and memory function are intact. She is socially cooperative and not violent.   She has been taking her psychotropic medication Abilify 10 mg daily, buspirone 10 mg b.i.d, and clonazepam 0.5 mg b.i.d.   PAST PSYCHIATRIC HISTORY: Ms. Grenier does have a history of prior depressive episodes and has been admitted to [facility] as well as [facility]. She has been diagnosed with bipolar disorder.   FAMILY PSYCHIATRIC HISTORY: None known.   SOCIAL HISTORY: Ms. Bronkema lives in the [facility]. She does not use alcohol or illegal drugs.   She has no legal history of arrests or incarceration.   PAST MEDICAL HISTORY: Gastroesophageal reflux disease.   ALLERGIES: Penicillin.  LABORATORY DATA: TSH on the 9th was normal. Hepatic function panel normal. Chemistry panel unremarkable. Ethanol negative. Urine drug screen negative. CBC unremarkable.   REVIEW OF SYSTEMS: Constitutional, HEENT, mouth, neurologic, psychiatric, cardiovascular, respiratory, gastrointestinal, genitourinary, skin, musculoskeletal, hematologic, lymphatic, endocrine, metabolic all  unremarkable.   PHYSICAL EXAMINATION:   VITAL SIGNS: Temperature 96.7, pulse 94, respiratory rate 20, blood pressure 128/100.   The patient was examined with female psychiatric staff escort.   GENERAL APPEARANCE: Ms. Morgano is a well developed, well nourished middle-aged female sitting on her hospital bed with no abnormal involuntary movement. She has no cachexia. Her muscle tone is normal. Her grooming and hygiene are normal.   HEENT: Head normocephalic, atraumatic. Pupils equally round and reactive light and accommodation. Oropharynx clear without erythema.   EXTREMITIES: No cyanosis, clubbing, or edema.   SKIN: Normal turgor. No rashes.   NECK: Supple, nontender. No masses.   LUNGS: Clear to auscultation. No wheezing, rhonchi, or rales.   CARDIOVASCULAR: Regular rate and rhythm. No murmurs, rubs, or gallops.   ABDOMEN: Nondistended. Bowel sounds positive. Soft, nontender.   GENITOURINARY: Deferred.   NEUROLOGIC: Cranial nerves II through XII intact. General sensory intact throughout to light touch. Motor 5/5 strength throughout. Deep tendon reflexes normal strength and symmetry throughout. No Babinski. Coordination intact by finger-to-nose bilaterally.   MENTAL STATUS EXAMINATION: Ms. Whitehair is alert. Her eye contact is good. Concentration is decreased. She is oriented to all spheres. Memory is intact to immediate, recent, and remote. Fund of knowledge, use of language, and intelligence mildly below average. Speech involves regular rate and rhythm. Normal prosody. There is a slight baseline dysarthria. Thought process logical, coherent, and goal directed. No looseness of associations or tangents. Thought content she does have suicidal ideation. She contracts for safety in the hospital. She has no thoughts of harming others. She has no hallucinations or delusions. Affect is very constricted. She is crying. Mood is depressed. Insight is marginal. Judgment is impaired.   She discusses  how she  is grieving that a family member had to go in the hospital for nephrolithiasis.   ASSESSMENT:  AXIS I: Bipolar I disorder, depressed.   AXIS II: Deferred.   AXIS III: Gastroesophageal reflux disease.   AXIS IV: Primary support group.   AXIS V: 30.   Ms. Mccarver is at risk for suicide.   PLAN:  1. Therefore, will admit Ms. Perham to the inpatient Behavioral Health Unit. She will undergo milieu and group psychotherapy.  2. For now she will be continued on her current psychotropic medication and will be monitored for altering as appropriate. Her Abilify will be continued for anti-psychosis and as an acute mood stabilizer. She will likely need either alteration of her mood stabilizer and/or the addition of a more effective antidepressant.    ____________________________ Adelene AmasJames S. Mont Jagoda, MD jsw:drc D: 10/25/2011 19:13:10 ET T: 10/26/2011 06:47:54 ET JOB#: [id number]  cc: Adelene AmasJames S. Casimira Sutphin, MD, <Dictator> Lester CarolinaJAMES S Leelynn Whetsel MD ELECTRONICALLY SIGNED 10/30/2011 10:33
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
## 2014-08-31 ENCOUNTER — Emergency Department
Admission: EM | Admit: 2014-08-31 | Discharge: 2014-08-31 | Disposition: A | Discharge status: Home / Self Care | Payer: Medicare Other | Attending: Emergency Medicine | Admitting: Emergency Medicine

## 2014-08-31 DIAGNOSIS — F329 Major depressive disorder, single episode, unspecified: Secondary | ICD-10-CM | POA: Diagnosis not present

## 2014-08-31 DIAGNOSIS — Z3202 Encounter for pregnancy test, result negative: Secondary | ICD-10-CM | POA: Insufficient documentation

## 2014-08-31 DIAGNOSIS — F32A Depression, unspecified: Secondary | ICD-10-CM

## 2014-08-31 DIAGNOSIS — Z880 Allergy status to penicillin: Secondary | ICD-10-CM | POA: Insufficient documentation

## 2014-08-31 DIAGNOSIS — G809 Cerebral palsy, unspecified: Secondary | ICD-10-CM

## 2014-08-31 DIAGNOSIS — R45851 Suicidal ideations: Secondary | ICD-10-CM | POA: Diagnosis present

## 2014-08-31 LAB — URINE DRUG SCREEN, QUALITATIVE (ARMC ONLY)
Amphetamines, Ur Screen: NOT DETECTED
BARBITURATES, UR SCREEN: NOT DETECTED
BENZODIAZEPINE, UR SCRN: NOT DETECTED
CANNABINOID 50 NG, UR ~~LOC~~: NOT DETECTED
Cocaine Metabolite,Ur ~~LOC~~: NOT DETECTED
MDMA (Ecstasy)Ur Screen: NOT DETECTED
Methadone Scn, Ur: NOT DETECTED
OPIATE, UR SCREEN: NOT DETECTED
PHENCYCLIDINE (PCP) UR S: NOT DETECTED
TRICYCLIC, UR SCREEN: NOT DETECTED

## 2014-08-31 LAB — COMPREHENSIVE METABOLIC PANEL
ALBUMIN: 3.8 g/dL (ref 3.5–5.0)
ALT: 18 U/L (ref 14–54)
ANION GAP: 7 (ref 5–15)
AST: 22 U/L (ref 15–41)
Alkaline Phosphatase: 45 U/L (ref 38–126)
BILIRUBIN TOTAL: 0.4 mg/dL (ref 0.3–1.2)
BUN: 16 mg/dL (ref 6–20)
CO2: 28 mmol/L (ref 22–32)
CREATININE: 0.8 mg/dL (ref 0.44–1.00)
Calcium: 8.9 mg/dL (ref 8.9–10.3)
Chloride: 108 mmol/L (ref 101–111)
GFR calc Af Amer: >60 mL/min (ref >60)
Glucose, Bld: 111 mg/dL — ABNORMAL HIGH (ref 65–99)
Potassium: 4 mmol/L (ref 3.5–5.1)
Sodium: 143 mmol/L (ref 135–145)
Total Protein: 6.8 g/dL (ref 6.5–8.1)

## 2014-08-31 LAB — URINALYSIS COMPLETE WITH MICROSCOPIC (ARMC ONLY)
BILIRUBIN URINE: NEGATIVE
Glucose, UA: NEGATIVE mg/dL
Ketones, ur: NEGATIVE mg/dL
Nitrite: NEGATIVE
Protein, ur: NEGATIVE mg/dL
Specific Gravity, Urine: 1.015 (ref 1.005–1.030)
pH: 5 (ref 5.0–8.0)

## 2014-08-31 LAB — CBC
HCT: 39.2 % (ref 35.0–47.0)
Hemoglobin: 13 g/dL (ref 12.0–16.0)
MCH: 31.7 pg (ref 26.0–34.0)
MCHC: 33.1 g/dL (ref 32.0–36.0)
MCV: 95.5 fL (ref 80.0–100.0)
Platelets: 273 10*3/uL (ref 150–440)
RBC: 4.11 MIL/uL (ref 3.80–5.20)
RDW: 13.8 % (ref 11.5–14.5)
WBC: 7.9 10*3/uL (ref 3.6–11.0)

## 2014-08-31 LAB — SALICYLATE LEVEL: Salicylate Lvl: <4 mg/dL (ref 2.8–30.0)

## 2014-08-31 LAB — POCT PREGNANCY, URINE: Preg Test, Ur: NEGATIVE

## 2014-08-31 LAB — ETHANOL

## 2014-08-31 LAB — ACETAMINOPHEN LEVEL: Acetaminophen (Tylenol), Serum: <10 ug/mL — ABNORMAL LOW (ref 10–30)

## 2014-08-31 NOTE — Consult Note (Signed)
[facility] Consult   Reason for Consult:  49 year old woman with a history of mental retardation and cerebral palsy who presented to the emergency room with a chief complaint "I'm suicidal" consult for actual evaluation of psychiatric needs Referring Physician:  goodman [name] MRN:  [id number] Principal Diagnosis: Adjustment disorder with mixed anxiety and depressed mood Diagnosis:   Patient Active Problem List   Diagnosis Date Noted  . Adjustment disorder with mixed anxiety and depressed mood [F43.23] 08/31/2014  . 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 Cerebral palsy [G80.9] 08/31/2014    Total Time spent with patient: 1 hour  Subjective:   [name] is a 49 y.o. female patient admitted with "I'm suicidal". On closer examination she doesn't actually mean that she wants to die or has any plans to do anything to harm herself. She is frustrated and upset that her brother didn't take her to visit their mother this weekend. See full history of present illness.Marland Kitchen  HPI:  Patient told the staff at her group home that she was suicidal yesterday and they brought her into the emergency room. Patient tells me "I'm suicidal" but when I actually ask her if she wanted to die or had any plans to try and kill herself she said no. It turns out she is feeling anxious and sad because her brother didn't take her to visit their mother this weekend. Patient claims that the mother is sick and might be dying and she wanted to go visit her in [place]. She had been counting on her brother to take her and he let her down. Patient's mood is been feeling anxious and sad more than usual the last couple days. Sleep is been about the same as usual. Appetite about the same as usual. Denies any hallucinations. Denies any wish to harm anyone else. She says she's been compliant with her medicines although she doesn't know what they are. She has not been  drinking or abusing any drugs.  Past psychiatric history of similar kinds of presentations. Patient is mentally retarded with cerebral palsy and has presented in the past similarly. She says that she made 1 suicide attempt when she was a teenager but none since then. It's been years since she was in a psychiatric hospital.  Social history is that she resides in a group home. She has siblings but it sounds like her relationship with him is on and off. From the way she describes it it sounds like the one brother who does pay attention to her she tends to call excessively and then alienate. Married no children herself.  Medical history positive for cerebral palsy  Family history negative as far as we know  Substance abuse history: Patient denies any current or past drug or alcohol abuse HPI Elements:   Quality:  Sad and frustrated mood. Severity:  Moderate. Timing:  Worse in the last few days. Duration:  Up-and-down especially bad after a disappointment. Context:  Brother didn't take her to [place] this weekend.  Past Medical History:  Past Medical History  Diagnosis Date  . Asthma     Past Surgical History  Procedure Laterality Date  . Cholecystectomy    . Tonsillectomy     Family History: No family history on file. Social History:  History  Alcohol Use No     History  Drug Use No    History   Social History  . Marital Status: Single    Spouse Name: N/A  . Number  of Children: N/A  . Years of Education: N/A   Social History Main Topics  . Smoking status: Never Smoker   . Smokeless tobacco: Not on file  . Alcohol Use: No  . Drug Use: No  . Sexual Activity: Not on file   Other Topics Concern  . Not on file   Social History Narrative  . No narrative on file   Additional Social History:    Pain Medications: None reported Prescriptions: None reported Over the Counter: None reported History of alcohol / drug use?: No history of alcohol / drug abuse Longest  period of sobriety (when/how long): None reported Negative Consequences of Use:  (None reported) Withdrawal Symptoms:  (None reported)                     Allergies:   Allergies  Allergen Reactions  . Penicillins Other (See Comments)    Pt states that she is "just scared to take it".      Labs:  Results for orders placed or performed during the hospital encounter of 08/31/14 (from the past 48 hour(s))  Acetaminophen level     Status: Abnormal   Collection Time: 08/31/14  1:13 PM  Result Value Ref Range   Acetaminophen (Tylenol), Serum <10 (L) 10 - 30 ug/mL    Comment:        THERAPEUTIC CONCENTRATIONS VARY SIGNIFICANTLY. A RANGE OF 10-30 ug/mL MAY BE AN EFFECTIVE CONCENTRATION FOR MANY PATIENTS. HOWEVER, SOME ARE BEST TREATED AT CONCENTRATIONS OUTSIDE THIS RANGE. ACETAMINOPHEN CONCENTRATIONS >150 ug/mL AT 4 HOURS AFTER INGESTION AND >50 ug/mL AT 12 HOURS AFTER INGESTION ARE OFTEN ASSOCIATED WITH TOXIC REACTIONS.   Ethanol (ETOH)     Status: None   Collection Time: 08/31/14  1:13 PM  Result Value Ref Range   Alcohol, Ethyl (B) <5 <5 mg/dL    Comment:        LOWEST DETECTABLE LIMIT FOR SERUM ALCOHOL IS 11 mg/dL FOR MEDICAL PURPOSES ONLY   Salicylate level     Status: None   Collection Time: 08/31/14  1:13 PM  Result Value Ref Range   Salicylate Lvl <3.9 2.8 - 30.0 mg/dL  CBC     Status: None   Collection Time: 08/31/14  1:39 PM  Result Value Ref Range   WBC 7.9 3.6 - 11.0 K/uL   RBC 4.11 3.80 - 5.20 MIL/uL   Hemoglobin 13.0 12.0 - 16.0 g/dL   HCT 39.2 35.0 - 47.0 %   MCV 95.5 80.0 - 100.0 fL   MCH 31.7 26.0 - 34.0 pg   MCHC 33.1 32.0 - 36.0 g/dL   RDW 13.8 11.5 - 14.5 %   Platelets 273 150 - 440 K/uL  Comprehensive metabolic panel     Status: Abnormal   Collection Time: 08/31/14  1:39 PM  Result Value Ref Range   Sodium 143 135 - 145 mmol/L   Potassium 4.0 3.5 - 5.1 mmol/L   Chloride 108 101 - 111 mmol/L   CO2 28 22 - 32 mmol/L   Glucose, Bld 111  (H) 65 - 99 mg/dL   BUN 16 6 - 20 mg/dL   Creatinine, Ser 0.80 0.44 - 1.00 mg/dL   Calcium 8.9 8.9 - 10.3 mg/dL   Total Protein 6.8 6.5 - 8.1 g/dL   Albumin 3.8 3.5 - 5.0 g/dL   AST 22 15 - 41 U/L   ALT 18 14 - 54 U/L   Alkaline Phosphatase 45 38 - 126 U/L  Total Bilirubin 0.4 0.3 - 1.2 mg/dL   GFR calc non Af Amer >60 >60 mL/min   GFR calc Af Amer >60 >60 mL/min    Comment: (NOTE) The eGFR has been calculated using the CKD EPI equation. This calculation has not been validated in all clinical situations. eGFR's persistently <60 mL/min signify possible Chronic Kidney Disease.    Anion gap 7 5 - 15  Urinalysis complete, with microscopic [facility])     Status: Abnormal   Collection Time: 08/31/14  1:39 PM  Result Value Ref Range   Color, Urine YELLOW (A) YELLOW   APPearance HAZY (A) CLEAR   Glucose, UA NEGATIVE NEGATIVE mg/dL   Bilirubin Urine NEGATIVE NEGATIVE   Ketones, ur NEGATIVE NEGATIVE mg/dL   Specific Gravity, Urine 1.015 1.005 - 1.030   Hgb urine dipstick 2+ (A) NEGATIVE   pH 5.0 5.0 - 8.0   Protein, ur NEGATIVE NEGATIVE mg/dL   Nitrite NEGATIVE NEGATIVE   Leukocytes, UA 1+ (A) NEGATIVE   RBC / HPF 0-5 0 - 5 RBC/hpf   WBC, UA 0-5 0 - 5 WBC/hpf   Bacteria, UA RARE (A) NONE SEEN   Squamous Epithelial / LPF 6-30 (A) NONE SEEN   Mucous PRESENT   Urine Drug Screen, Qualitative [facility])     Status: None   Collection Time: 08/31/14  1:39 PM  Result Value Ref Range   Tricyclic, Ur Screen NONE DETECTED NONE DETECTED   Amphetamines, Ur Screen NONE DETECTED NONE DETECTED   MDMA (Ecstasy)Ur Screen NONE DETECTED NONE DETECTED   Cocaine Metabolite,Ur [place] NONE DETECTED NONE DETECTED   Opiate, Ur Screen NONE DETECTED NONE DETECTED   Phencyclidine (PCP) Ur S NONE DETECTED NONE DETECTED   Cannabinoid 50 Ng, Ur  NONE DETECTED NONE DETECTED   Barbiturates, Ur Screen NONE DETECTED NONE DETECTED   Benzodiazepine, Ur Scrn NONE DETECTED NONE DETECTED   Methadone Scn, Ur NONE DETECTED NONE  DETECTED    Comment: (NOTE) 470  Tricyclics, urine               Cutoff 1000 ng/mL 200  Amphetamines, urine             Cutoff 1000 ng/mL 300  MDMA (Ecstasy), urine           Cutoff 500 ng/mL 400  Cocaine Metabolite, urine       Cutoff 300 ng/mL 500  Opiate, urine                   Cutoff 300 ng/mL 600  Phencyclidine (PCP), urine      Cutoff 25 ng/mL 700  Cannabinoid, urine              Cutoff 50 ng/mL 800  Barbiturates, urine             Cutoff 200 ng/mL 900  Benzodiazepine, urine           Cutoff 200 ng/mL 1000 Methadone, urine                Cutoff 300 ng/mL 1100 1200 The urine drug screen provides only a preliminary, unconfirmed 1300 analytical test result and should not be used for non-medical 1400 purposes. Clinical consideration and professional judgment should 1500 be applied to any positive drug screen result due to possible 1600 interfering substances. A more specific alternate chemical method 1700 must be used in order to obtain a confirmed analytical result.  1800 Gas chromato graphy / mass spectrometry (GC/MS) is the preferred 1900 confirmatory  method.   Pregnancy, urine POC     Status: None   Collection Time: 08/31/14  1:48 PM  Result Value Ref Range   Preg Test, Ur NEGATIVE NEGATIVE    Comment:        THE SENSITIVITY OF THIS METHODOLOGY IS >24 mIU/mL     Vitals: Blood pressure 111/69.  Risk to Self:   Risk to Others:   Prior Inpatient Therapy:   Prior Outpatient Therapy:    No current facility-administered medications for this encounter.   Current Outpatient Prescriptions  Medication Sig Dispense Refill  . albuterol (PROVENTIL HFA;VENTOLIN HFA) 108 (90 BASE) MCG/ACT inhaler Inhale 2 puffs into the lungs every 4 (four) hours as needed for wheezing or shortness of breath.    . ARIPiprazole (ABILIFY) 20 MG tablet Take 20 mg by mouth daily.    . busPIRone (BUSPAR) 10 MG tablet Take 10 mg by mouth 3 (three) times daily.    . clonazePAM (KLONOPIN) 0.5 MG tablet  Take 0.5 mg by mouth 2 (two) times daily.    Marland Kitchen escitalopram (LEXAPRO) 10 MG tablet Take 10 mg by mouth at bedtime.    . fenofibrate 54 MG tablet Take 54 mg by mouth daily.    . fluticasone (FLONASE) 50 MCG/ACT nasal spray Place 1-2 sprays into both nostrils daily.    . Fluticasone-Salmeterol (ADVAIR) 100-50 MCG/DOSE AEPB Inhale 1 puff into the lungs 2 (two) times daily.    . Hypromellose (ARTIFICIAL TEARS OP) Apply 2 drops to eye 3 (three) times daily.    . meloxicam (MOBIC) 15 MG tablet Take 15 mg by mouth daily.    . pantoprazole (PROTONIX) 40 MG tablet Take 40 mg by mouth daily.    Marland Kitchen Umeclidinium Bromide 62.5 MCG/INH AEPB Inhale 1 puff into the lungs daily.    . methocarbamol (ROBAXIN) 500 MG tablet Take 1 tablet (500 mg total) by mouth 3 (three) times daily between meals as needed. (Patient not taking: Reported on 08/31/2014) 20 tablet 0  . naproxen (NAPROSYN) 500 MG tablet Take 1 tablet (500 mg total) by mouth 2 (two) times daily. (Patient not taking: Reported on 08/31/2014) 30 tablet 0    Musculoskeletal: Strength & Muscle Tone: spastic Gait & Station: unsteady Patient leans: N/A  Psychiatric Specialty Exam: Physical Exam  Constitutional: She appears well-developed and well-nourished.  HENT:  Head: Normocephalic and atraumatic.  Eyes: Conjunctivae are normal. Pupils are equal, round, and reactive to light.  Neck: Normal range of motion.  Cardiovascular: Normal heart sounds.   Respiratory: Effort normal.  GI: Soft.  Musculoskeletal: Normal range of motion.  Neurological: She is alert.  Skin: Skin is warm and dry.  Psychiatric: Her behavior is normal. Her mood appears anxious. Her speech is delayed and tangential. Thought content is not paranoid. Cognition and memory are impaired. She expresses impulsivity. She exhibits a depressed mood. She expresses no homicidal and no suicidal ideation. She expresses no suicidal plans and no homicidal plans. She exhibits abnormal recent memory.     Review of Systems  Constitutional: Negative.   HENT: Negative.   Eyes: Negative.   Respiratory: Negative.   Cardiovascular: Negative.   Gastrointestinal: Negative.   Musculoskeletal: Negative.   Skin: Negative.   Neurological: Negative.   Psychiatric/Behavioral: Positive for depression. Negative for suicidal ideas, hallucinations and substance abuse. The patient is nervous/anxious. The patient does not have insomnia.     Blood pressure 111/69.There is no weight on file to calculate BMI.  General Appearance: Disheveled  Eye Contact::  Good  Speech:  Blocked and Slow  Volume:  Decreased  Mood:  Euthymic  Affect:  Flat  Thought Process:  Circumstantial  Orientation:  Full (Time, Place, and Person)  Thought Content:  Negative  Suicidal Thoughts:  No  Homicidal Thoughts:  No  Memory:  Immediate;   Good Recent;   Fair Remote;   Fair  Judgement:  Impaired  Insight:  Lacking  Psychomotor Activity:  Negative  Concentration:  Fair  Recall:  [employer] of Knowledge:Fair  Language: Fair  Akathisia:  No  Handed:  Right  AIMS (if indicated):     Assets:  Financial Resources/Insurance Housing Intimacy  ADL's:  Intact  Cognition: Impaired,  Moderate  Sleep:      Medical Decision Making: New Problem, with no additional work-up planned (3), Review of Last Therapy Session (1), Review or order medicine tests (1) and Review of Medication Regimen & Side Effects (2)  Treatment Plan Summary: Plan Patient does not require inpatient hospitalization. Although she used the term suicidal" she doesn't actually have any wish to die or thoughts of doing anything to harm herself. She is able to articulate positive things about her life and things she is looking forward to. Patient has an appropriate place to live and outpatient care in place. At this point I don't think she meets commitment criteria. Involuntary commitment discontinued. Patient given supportive counseling and encouraged to talk with  her family and staff about her frustrations. No new prescriptions written. Case discussed with emergency room physician and psychiatric team  Plan:  Patient does not meet criteria for psychiatric inpatient admission. Supportive therapy provided about ongoing stressors. Disposition: Discontinue IVC recommend released from the emergency room  Weber Cooks [facility] 08/31/2014 6:24 PM

## 2014-08-31 NOTE — ED Provider Notes (Signed)
[facility] Emergency Department Provider Note    ____________________________________________  Time seen: 1535  I have reviewed the triage vital signs and the nursing notes.   HISTORY  Chief Complaint Suicidal   History limited by: Not Limited   HPI [name] is a 49 y.o. female who presents to the emergency department today from group home because of concerns for suicidal ideation. Patient states that she has been having a side for the past 3 days. She states that this was triggered by a family matter. She feels that no one cares about her. She has a plan to jump into traffic. She states she has had to have hospitalizations for SI in the past and has sent tried to hurt herself in the past. Patient states she has had some hallucinations that are telling her to hurt herself. The patient denies missing any of her psychiatric medications. Patient denies any fevers, chest pain, shortness of breath.     Past Medical History  Diagnosis Date  . Asthma     Patient Active Problem List   Diagnosis Date Noted  . Adjustment disorder with mixed anxiety and depressed mood 08/31/2014  . 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 Cerebral palsy 08/31/2014    Past Surgical History  Procedure Laterality Date  . Cholecystectomy    . Tonsillectomy      Current Outpatient Rx  Name  Route  Sig  Dispense  Refill  . albuterol (PROVENTIL HFA;VENTOLIN HFA) 108 (90 BASE) MCG/ACT inhaler   Inhalation   Inhale 2 puffs into the lungs every 4 (four) hours as needed for wheezing or shortness of breath.         . ARIPiprazole (ABILIFY) 20 MG tablet   Oral   Take 20 mg by mouth daily.         . busPIRone (BUSPAR) 10 MG tablet   Oral   Take 10 mg by mouth 3 (three) times daily.         . clonazePAM (KLONOPIN) 0.5 MG tablet   Oral   Take 0.5 mg by mouth 2 (two) times daily.         Marland Kitchen. escitalopram (LEXAPRO) 10 MG tablet   Oral   Take 10 mg by  mouth at bedtime.         . fenofibrate 54 MG tablet   Oral   Take 54 mg by mouth daily.         . fluticasone (FLONASE) 50 MCG/ACT nasal spray   Each Nare   Place 1-2 sprays into both nostrils daily.         . Fluticasone-Salmeterol (ADVAIR) 100-50 MCG/DOSE AEPB   Inhalation   Inhale 1 puff into the lungs 2 (two) times daily.         . Hypromellose (ARTIFICIAL TEARS OP)   Ophthalmic   Apply 2 drops to eye 3 (three) times daily.         . meloxicam (MOBIC) 15 MG tablet   Oral   Take 15 mg by mouth daily.         . pantoprazole (PROTONIX) 40 MG tablet   Oral   Take 40 mg by mouth daily.         Marland Kitchen. Umeclidinium Bromide 62.5 MCG/INH AEPB   Inhalation   Inhale 1 puff into the lungs daily.         . methocarbamol (ROBAXIN) 500 MG tablet   Oral   Take 1 tablet (500 mg total) by  mouth 3 (three) times daily between meals as needed. Patient not taking: Reported on 08/31/2014   20 tablet   0   . naproxen (NAPROSYN) 500 MG tablet   Oral   Take 1 tablet (500 mg total) by mouth 2 (two) times daily. Patient not taking: Reported on 08/31/2014   30 tablet   0     Allergies Penicillins  No family history on file.  Social History History  Substance Use Topics  . Smoking status: Never Smoker   . Smokeless tobacco: Not on file  . Alcohol Use: No    Review of Systems  Constitutional: Negative for fever. Cardiovascular: Negative for chest pain. Respiratory: Negative for shortness of breath. Gastrointestinal: Negative for abdominal pain, vomiting and diarrhea. Genitourinary: Negative for dysuria. Musculoskeletal: Negative for back pain. Skin: Negative for rash. Neurological: Negative for headaches, focal weakness or numbness. Psychiatric: SI, hallucinations  10-point ROS otherwise negative.  ____________________________________________   PHYSICAL EXAM:  VITAL SIGNS: ED Triage Vitals  Enc Vitals Group     BP --      Pulse Rate 08/31/14 1348 78      Resp --      Temp 08/31/14 1348 98.8 F (37.1 C)     Temp Source 08/31/14 1348 Oral   Constitutional: Alert and oriented. Well appearing and in no distress. Eyes: Conjunctivae are normal. PERRL. Normal extraocular movements. ENT   Head: Normocephalic and atraumatic.   Nose: No congestion/rhinnorhea.   Mouth/Throat: Mucous membranes are moist.   Neck: No stridor. Hematological/Lymphatic/Immunilogical: No cervical lymphadenopathy. Cardiovascular: Normal rate, regular rhythm.  No murmurs, rubs, or gallops. Respiratory: Normal respiratory effort without tachypnea nor retractions. Breath sounds are clear and equal bilaterally. No wheezes/rales/rhonchi. Gastrointestinal: Soft and nontender. No distention.  Genitourinary: Deferred Musculoskeletal: Normal range of motion in all extremities. No joint effusions.  No lower extremity tenderness nor edema. Neurologic:  Normal speech and language. No gross focal neurologic deficits are appreciated. Speech is normal.  Skin:  Skin is warm, dry and intact. No rash noted. Psychiatric: Patient is depressed. SI. Does not appear to be responding to any internal stimuli  ____________________________________________    LABS (pertinent positives/negatives)  Labs Reviewed  ACETAMINOPHEN LEVEL - Abnormal; Notable for the following:    Acetaminophen (Tylenol), Serum <10 (*)    All other components within normal limits  COMPREHENSIVE METABOLIC PANEL - Abnormal; Notable for the following:    Glucose, Bld 111 (*)    All other components within normal limits  URINALYSIS COMPLETEWITH MICROSCOPIC ([facility])  - Abnormal; Notable for the following:    Color, Urine YELLOW (*)    APPearance HAZY (*)    Hgb urine dipstick 2+ (*)    Leukocytes, UA 1+ (*)    Bacteria, UA RARE (*)    Squamous Epithelial / LPF 6-30 (*)    All other components within normal limits  CBC  ETHANOL  SALICYLATE LEVEL  URINE DRUG SCREEN, QUALITATIVE ([facility])  POC URINE  PREG, ED  POCT PREGNANCY, URINE     ____________________________________________   EKG  None  ____________________________________________    RADIOLOGY  None  ____________________________________________   PROCEDURES  Procedure(s) performed: None  Critical Care performed: No  ____________________________________________   INITIAL IMPRESSION / ASSESSMENT AND PLAN / ED COURSE  Pertinent labs & imaging results that were available during my care of the patient were reviewed by me and considered in my medical decision making (see chart for details).  Patient here with concerns for suicidal ideation. The patient  does have a plan to jump in front of traffic and mentioned many times that if she was discharged she would go and jump in front of traffic. Given patient's SI and psychiatric history of feel the best thing would be to place patient under IVC until she can have a thorough eval by a psychiatrist.  ----------------------------------------- 6:29 PM on 08/31/2014 -----------------------------------------  Dr. Toni Amendlapacs has seen the patient believes she has not acute danger to herself. He has rescinded the IVC paperwork. ____________________________________________   FINAL CLINICAL IMPRESSION(S) / ED DIAGNOSES  Final diagnoses:  Depression     Phineas SemenGraydon Roniyah Llorens, MD 08/31/14 [phone]

## 2014-08-31 NOTE — ED Notes (Signed)
BEHAVIORAL HEALTH ROUNDING Patient sleeping: No. Patient alert and oriented: yes Behavior appropriate: Yes.  ; If no, describe:  Nutrition and fluids offered: Yes  Toileting and hygiene offered: Yes  Sitter present: no Law enforcement present: Yes  

## 2014-08-31 NOTE — Discharge Instructions (Signed)
We seek medical attention for any thoughts about wanting to harm herself or others, worsening depression, concerning change in behavior or any other new or concerning symptoms.  Major Depressive Disorder Major depressive disorder is a mental illness. It also may be called clinical depression or unipolar depression. Major depressive disorder usually causes feelings of sadness, hopelessness, or helplessness. Some people with this disorder do not feel particularly sad but lose interest in doing things they used to enjoy (anhedonia). Major depressive disorder also can cause physical symptoms. It can interfere with work, school, relationships, and other normal everyday activities. The disorder varies in severity but is longer lasting and more serious than the sadness we all feel from time to time in our lives. Major depressive disorder often is triggered by stressful life events or major life changes. Examples of these triggers include divorce, loss of your job or home, a move, and the death of a family member or close friend. Sometimes this disorder occurs for no obvious reason at all. People who have family members with major depressive disorder or bipolar disorder are at higher risk for developing this disorder, with or without life stressors. Major depressive disorder can occur at any age. It may occur just once in your life (single episode major depressive disorder). It may occur multiple times (recurrent major depressive disorder). SYMPTOMS People with major depressive disorder have either anhedonia or depressed mood on nearly a daily basis for at least 2 weeks or longer. Symptoms of depressed mood include:  Feelings of sadness (blue or down in the dumps) or emptiness.  Feelings of hopelessness or helplessness.  Tearfulness or episodes of crying (may be observed by others).  Irritability (children and adolescents). In addition to depressed mood or anhedonia or both, people with this disorder have at  least four of the following symptoms:  Difficulty sleeping or sleeping too much.   Significant change (increase or decrease) in appetite or weight.   Lack of energy or motivation.  Feelings of guilt and worthlessness.   Difficulty concentrating, remembering, or making decisions.  Unusually slow movement (psychomotor retardation) or restlessness (as observed by others).   Recurrent wishes for death, recurrent thoughts of self-harm (suicide), or a suicide attempt. People with major depressive disorder commonly have persistent negative thoughts about themselves, other people, and the world. People with severe major depressive disorder may experiencedistorted beliefs or perceptions about the world (psychotic delusions). They also may see or hear things that are not real (psychotic hallucinations). DIAGNOSIS Major depressive disorder is diagnosed through an assessment by your health care provider. Your health care provider will ask aboutaspects of your daily life, such as mood,sleep, and appetite, to see if you have the diagnostic symptoms of major depressive disorder. Your health care provider may ask about your medical history and use of alcohol or drugs, including prescription medicines. Your health care provider also may do a physical exam and blood work. This is because certain medical conditions and the use of certain substances can cause major depressive disorder-like symptoms (secondary depression). Your health care provider also may refer you to a mental health specialist for further evaluation and treatment. TREATMENT It is important to recognize the symptoms of major depressive disorder and seek treatment. The following treatments can be prescribed for this disorder:   Medicine. Antidepressant medicines usually are prescribed. Antidepressant medicines are thought to correct chemical imbalances in the brain that are commonly associated with major depressive disorder. Other types of  medicine may be added if the symptoms do  not respond to antidepressant medicines alone or if psychotic delusions or hallucinations occur.  Talk therapy. Talk therapy can be helpful in treating major depressive disorder by providing support, education, and guidance. Certain types of talk therapy also can help with negative thinking (cognitive behavioral therapy) and with relationship issues that trigger this disorder (interpersonal therapy). A mental health specialist can help determine which treatment is best for you. Most people with major depressive disorder do well with a combination of medicine and talk therapy. Treatments involving electrical stimulation of the brain can be used in situations with extremely severe symptoms or when medicine and talk therapy do not work over time. These treatments include electroconvulsive therapy, transcranial magnetic stimulation, and vagal nerve stimulation. Document Released: 07/29/2012 Document Revised: 08/18/2013 Document Reviewed: 07/29/2012 [facility] Patient Information 2015 [place], [place]. This information is not intended to replace advice given to you by your health care provider. Make sure you discuss any questions you have with your health care provider.

## 2014-08-31 NOTE — ED Notes (Signed)
Patient assigned to appropriate care area. Patient oriented to unit/care area: Informed that, for their safety, care areas are designed for safety and monitored by security cameras at all times; and visiting hours explained to patient. Patient verbalizes understanding, and verbal contract for safety obtained. 

## 2014-08-31 NOTE — ED Notes (Signed)
BEHAVIORAL HEALTH ROUNDING Patient sleeping: Yes.   Patient alert and oriented: yes Behavior appropriate: Yes.  ; If no, describe:  Nutrition and fluids offered: Yes  Toileting and hygiene offered: Yes  Sitter present: no Law enforcement present: Yes  

## 2014-08-31 NOTE — BHH Counselor (Signed)
Writer spoke with [address] [facility] [address], [facility]-[phone]) and informed them of the pt. Discharging.  They requested a phone call when ready for pick up. Information froward to pt. Nurse Olegario Messier(Kathy)

## 2014-08-31 NOTE — ED Notes (Signed)
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
## 2014-09-22 ENCOUNTER — Encounter: Payer: Self-pay | Admitting: Emergency Medicine

## 2014-09-22 ENCOUNTER — Emergency Department
Admission: EM | Admit: 2014-09-22 | Discharge: 2014-09-23 | Disposition: A | Discharge status: Other Acute Inpatient Hospital | Payer: Medicare Other | Attending: Emergency Medicine | Admitting: Emergency Medicine

## 2014-09-22 DIAGNOSIS — Z79899 Other long term (current) drug therapy: Secondary | ICD-10-CM | POA: Insufficient documentation

## 2014-09-22 DIAGNOSIS — F329 Major depressive disorder, single episode, unspecified: Secondary | ICD-10-CM | POA: Diagnosis not present

## 2014-09-22 DIAGNOSIS — R45851 Suicidal ideations: Secondary | ICD-10-CM | POA: Diagnosis not present

## 2014-09-22 DIAGNOSIS — F4323 Adjustment disorder with mixed anxiety and depressed mood: Secondary | ICD-10-CM | POA: Diagnosis not present

## 2014-09-22 DIAGNOSIS — Z880 Allergy status to penicillin: Secondary | ICD-10-CM | POA: Insufficient documentation

## 2014-09-22 DIAGNOSIS — Z791 Long term (current) use of non-steroidal anti-inflammatories (NSAID): Secondary | ICD-10-CM | POA: Diagnosis not present

## 2014-09-22 DIAGNOSIS — G809 Cerebral palsy, unspecified: Secondary | ICD-10-CM | POA: Diagnosis present

## 2014-09-22 DIAGNOSIS — Z008 Encounter for other general examination: Secondary | ICD-10-CM | POA: Diagnosis present

## 2014-09-22 DIAGNOSIS — Z7951 Long term (current) use of inhaled steroids: Secondary | ICD-10-CM | POA: Diagnosis not present

## 2014-09-22 DIAGNOSIS — F23 Brief psychotic disorder: Secondary | ICD-10-CM | POA: Diagnosis not present

## 2014-09-22 LAB — URINE DRUG SCREEN, QUALITATIVE (ARMC ONLY)
AMPHETAMINES, UR SCREEN: NOT DETECTED
BENZODIAZEPINE, UR SCRN: NOT DETECTED
Barbiturates, Ur Screen: NOT DETECTED
CANNABINOID 50 NG, UR ~~LOC~~: NOT DETECTED
COCAINE METABOLITE, UR ~~LOC~~: NOT DETECTED
MDMA (Ecstasy)Ur Screen: NOT DETECTED
METHADONE SCREEN, URINE: NOT DETECTED
Opiate, Ur Screen: NOT DETECTED
Phencyclidine (PCP) Ur S: NOT DETECTED
TRICYCLIC, UR SCREEN: NOT DETECTED

## 2014-09-22 MED ORDER — MOMETASONE FURO-FORMOTEROL FUM 100-5 MCG/ACT IN AERO
2.0000 | INHALATION_SPRAY | Freq: Two times a day (BID) | RESPIRATORY_TRACT | Status: DC
  Administered 2014-09-22 – 2014-09-23 (×2): 2 via RESPIRATORY_TRACT
  Filled 2014-09-22: qty 8.8

## 2014-09-22 MED ORDER — BUSPIRONE HCL 10 MG PO TABS
10.0000 mg | ORAL_TABLET | Freq: Three times a day (TID) | ORAL | Status: DC
  Administered 2014-09-22 – 2014-09-23 (×3): 10 mg via ORAL
  Filled 2014-09-22 (×6): qty 1

## 2014-09-22 MED ORDER — ARIPIPRAZOLE 10 MG PO TABS
20.0000 mg | ORAL_TABLET | Freq: Every day | ORAL | Status: DC
  Administered 2014-09-23: 20 mg via ORAL
  Filled 2014-09-22: qty 2

## 2014-09-22 NOTE — ED Notes (Signed)
Patient assigned to appropriate care area. Patient oriented to unit/care area: Informed that, for their safety, care areas are designed for safety and monitored by security cameras at all times; and visiting hours explained to patient. Patient verbalizes understanding, and verbal contract for safety obtained. 

## 2014-09-22 NOTE — ED Notes (Signed)
ED BHU PLACEMENT JUSTIFICATION Is the patient under IVC or is there intent for IVC: No. Is the patient medically cleared: No. Is there vacancy in the ED BHU: Yes.   Is the population mix appropriate for patient: Yes.   Is the patient awaiting placement in inpatient or outpatient setting: No. Has the patient had a psychiatric consult: No. Survey of unit performed for contraband, proper placement and condition of furniture, tampering with fixtures in bathroom, shower, and each patient room: Yes.  ; Findings: Cables and other harmful objects removed from  Room.  APPEARANCE/BEHAVIOR calm, cooperative and adequate rapport can be established NEURO ASSESSMENT Orientation: time, place and person Hallucinations: Yes.  Auditory Hallucinations Speech: Garbled Gait: normal RESPIRATORY ASSESSMENT Normal expansion.  Clear to auscultation.  No rales, rhonchi, or wheezing. CARDIOVASCULAR ASSESSMENT regular rate and rhythm, S1, S2 normal, no murmur, click, rub or gallop and IRR GASTROINTESTINAL ASSESSMENT soft, nontender, BS WNL, no r/g EXTREMITIES normal strength, tone, and muscle mass PLAN OF CARE Provide calm/safe environment. Vital signs assessed twice daily. ED BHU Assessment once each 12-hour shift. Collaborate with intake RN daily or as condition indicates. Assure the ED provider has rounded once each shift. Provide and encourage hygiene. Provide redirection as needed. Assess for escalating behavior; address immediately and inform ED provider.  Assess family dynamic and appropriateness for visitation as needed: No.; If necessary, describe findings: Family not available for evaluation.  Educate the patient/family about BHU procedures/visitation: Yes.  ; If necessary, describe findings:

## 2014-09-22 NOTE — ED Notes (Signed)

## 2014-09-22 NOTE — ED Notes (Signed)
BEHAVIORAL HEALTH ROUNDING Patient sleeping: No. Patient alert and oriented: yes Behavior appropriate: Yes.  ; If no, describe:  Nutrition and fluids offered: Yes  Toileting and hygiene offered: Yes  Sitter present: yes Law enforcement present: Yes  
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
## 2014-09-22 NOTE — ED Provider Notes (Signed)
[facility] Emergency Department Provider Note  ____________________________________________  Time seen: Approximately 9:08 PM  I have reviewed the triage vital signs and the nursing notes.   HISTORY  Chief Complaint Psychiatric Evaluation    HPI [name] is a 49 y.o. female with a history of previous suicidal ideation. Patient also has a history of asthma. Patient states that she broke up with her boyfriend, and since then she has felt like she wants to die. She states she plans to jump from a car. She denies actually making any attempt to harm herself. She presents to the ER seeking treatment for what she describes as depression and suicidal ideation.  She denies taking any overdose. She denies any other acute medical complaint. No shortness of breath, cough, fever, headache or other concerns.  Additionally, patient does mention she does not like her group home because it has "bugs" in her room.   Past Medical History  Diagnosis Date  . Asthma     Patient Active Problem List   Diagnosis Date Noted  . Adjustment disorder with mixed anxiety and depressed mood 08/31/2014  . 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 Cerebral palsy 08/31/2014    Past Surgical History  Procedure Laterality Date  . Cholecystectomy    . Tonsillectomy      Current Outpatient Rx  Name  Route  Sig  Dispense  Refill  . albuterol (PROVENTIL HFA;VENTOLIN HFA) 108 (90 BASE) MCG/ACT inhaler   Inhalation   Inhale 2 puffs into the lungs every 4 (four) hours as needed for wheezing or shortness of breath.         . ARIPiprazole (ABILIFY) 20 MG tablet   Oral   Take 20 mg by mouth daily.         . busPIRone (BUSPAR) 10 MG tablet   Oral   Take 10 mg by mouth 3 (three) times daily.         . clonazePAM (KLONOPIN) 0.5 MG tablet   Oral   Take 0.5 mg by mouth 2 (two) times daily.         Marland Kitchen escitalopram (LEXAPRO) 10 MG tablet   Oral   Take 10 mg by  mouth at bedtime.         . fenofibrate 54 MG tablet   Oral   Take 54 mg by mouth daily.         . fluticasone (FLONASE) 50 MCG/ACT nasal spray   Each Nare   Place 1-2 sprays into both nostrils daily.         . Fluticasone-Salmeterol (ADVAIR) 100-50 MCG/DOSE AEPB   Inhalation   Inhale 1 puff into the lungs 2 (two) times daily.         . Hypromellose (ARTIFICIAL TEARS OP)   Ophthalmic   Apply 2 drops to eye 3 (three) times daily.         . meloxicam (MOBIC) 15 MG tablet   Oral   Take 15 mg by mouth daily.         . methocarbamol (ROBAXIN) 500 MG tablet   Oral   Take 1 tablet (500 mg total) by mouth 3 (three) times daily between meals as needed. Patient not taking: Reported on 08/31/2014   20 tablet   0   . naproxen (NAPROSYN) 500 MG tablet   Oral   Take 1 tablet (500 mg total) by mouth 2 (two) times daily. Patient not taking: Reported on 08/31/2014   30 tablet  0   . pantoprazole (PROTONIX) 40 MG tablet   Oral   Take 40 mg by mouth daily.         Marland Kitchen. Umeclidinium Bromide 62.5 MCG/INH AEPB   Inhalation   Inhale 1 puff into the lungs daily.           Allergies Penicillins  History reviewed. No pertinent family history.  Social History History  Substance Use Topics  . Smoking status: Never Smoker   . Smokeless tobacco: Not on file  . Alcohol Use: No    Review of Systems Constitutional: No fever/chills Eyes: No visual changes. ENT: No sore throat. Cardiovascular: Denies chest pain. Respiratory: Denies shortness of breath. Gastrointestinal: No abdominal pain.  No nausea, no vomiting.  No diarrhea.  No constipation. Genitourinary: Negative for dysuria. Musculoskeletal: Negative for back pain. Skin: Negative for rash. Neurological: Negative for headaches, focal weakness or numbness.  states she is suicidal. She has no plan to harm or others.  10-point ROS otherwise negative.  ____________________________________________   PHYSICAL  EXAM:  VITAL SIGNS: ED Triage Vitals  Enc Vitals Group     BP 09/22/14 2102 165/78 mmHg     Pulse Rate 09/22/14 2102 85     Resp --      Temp 09/22/14 2102 98.6 F (37 C)     Temp Source 09/22/14 2102 Oral     SpO2 09/22/14 2054 99 %     Weight 09/22/14 2102 206 lb (93.441 kg)     Height 09/22/14 2102 5\' 3"  (1.6 m)     Head Cir --      Peak Flow --      Pain Score 09/22/14 2103 0     Pain Loc --      Pain Edu? --      Excl. in GC? --     Constitutional: Alert and oriented. Well appearing and in no acute distress. Eyes: Conjunctivae are normal. PERRL. EOMI. Head: Atraumatic. Nose: No congestion/rhinnorhea. Mouth/Throat: Mucous membranes are moist.  Oropharynx non-erythematous. Neck: No stridor.   Cardiovascular: Normal rate, regular rhythm. Grossly normal heart sounds.  Good peripheral circulation. Respiratory: Normal respiratory effort.  No retractions. Lungs CTAB. Gastrointestinal: Soft and nontender. No distention. No abdominal bruits. No CVA tenderness. Obese. Musculoskeletal: No lower extremity tenderness nor edema.  No joint effusions. Neurologic:  Normal speech and language. No gross focal neurologic deficits are appreciated. Speech is normal. No gait instability. Skin:  Skin is warm, dry and intact. No rash noted. Psychiatric: Mood and affect are flat. She is calm, and not agitated. ____________________________________________   LABS (all labs ordered are listed, but only abnormal results are displayed)  Labs Reviewed - No data to display ____________________________________________  EKG   ____________________________________________  RADIOLOGY   ____________________________________________   PROCEDURES  Procedure(s) performed: None  Critical Care performed: No  ____________________________________________   INITIAL IMPRESSION / ASSESSMENT AND PLAN / ED COURSE  Pertinent labs & imaging results that were available during my care of the patient  were reviewed by me and considered in my medical decision making (see chart for details).  Patient denies any acute medical complaint. She denies active overdose or suicide attempt. She does present because of suicidal ideation. We will place the patient under IVC and consult psychiatry for further evaluation.  ----------------------------------------- 11:52 PM on 09/22/2014 -----------------------------------------  Care and disposition assigned to Dr. Zenda AlpersWebster. At this point patient is on IVC, awaiting psychiatric consultation, Dr. Zenda AlpersWebster to follow-up on remaining laboratory evaluation which is  collected and pending. ____________________________________________   FINAL CLINICAL IMPRESSION(S) / ED DIAGNOSES  Suicidal ideation, initial, acute   Sharyn Creamer, MD 09/22/14 2353

## 2014-09-22 NOTE — ED Notes (Signed)
Per Pt: Pt arrived to the ED via POV accompanied by group home staff for suicidal ideation. Pt states that she has been suicidal for the last 15 days being seen in  The ED 2 days ago for the same and was discharged. Pt reports that she "broke up with her boyfriend last night and that is why she is suicidal. Pt reports that she "plans to jump in front of a car and dye." Pt also reports that she "does not like her group home and wants to go to another one; my current one has to many bugs." Pt is AOx4 in no apparent distress.

## 2014-09-22 NOTE — ED Notes (Signed)
Gave a meal to PT.

## 2014-09-22 NOTE — ED Notes (Signed)
Patient assigned to appropriate care area. Patient oriented to unit/care area: Informed that, for their safety, care areas are designed for safety and monitored by security cameras at all times; and visiting hours explained to patient. Patient verbalizes understanding, and verbal contract for safety obtained.  Pt brought into ED BHU via sally port and wanded with metal detector for safety by ODS officer. Accompanied by Sherilyn CooterHenry R.-RN and Officer. Patient oriented to unit/care area: Pt informed of unit policies and procedures.  Informed that, for their safety, care areas are designed for safety and monitored by security cameras at all times; and visiting hours explained to patient. Patient verbalizes understanding, and verbal contract for safety obtained.Pt shown to their room.   ENVIRONMENTAL ASSESSMENT Potentially harmful objects out of patient reach: Yes.   Personal belongings secured: Yes.   Patient dressed in hospital provided attire only: Yes.   Plastic bags out of patient reach: Yes.   Patient care equipment (cords, cables, call bells, lines, and drains) shortened, removed, or accounted for: Yes.  .   Potentially toxic materials out of patient reach: Yes.   Sharps container removed or out of patient reach: Yes.    BEHAVIORAL HEALTH ROUNDING Patient sleeping: No. Patient alert and oriented: yes Behavior appropriate: Yes.   Nutrition and fluids offered: Yes  Toileting and hygiene offered: Yes  Sitter present: q15 min observations and security camera monitoring Law enforcement present: Yes [employer]

## 2014-09-23 ENCOUNTER — Encounter: Payer: Self-pay | Admitting: *Deleted

## 2014-09-23 ENCOUNTER — Inpatient Hospital Stay
Admission: AD | Admit: 2014-09-23 | Discharge: 2014-09-25 | DRG: 885 | Disposition: A | Discharge status: Home / Self Care | Payer: Medicare Other | Attending: Psychiatry | Admitting: Psychiatry

## 2014-09-23 DIAGNOSIS — R45851 Suicidal ideations: Secondary | ICD-10-CM | POA: Diagnosis present

## 2014-09-23 DIAGNOSIS — F331 Major depressive disorder, recurrent, moderate: Principal | ICD-10-CM | POA: Diagnosis present

## 2014-09-23 DIAGNOSIS — F23 Brief psychotic disorder: Secondary | ICD-10-CM

## 2014-09-23 DIAGNOSIS — F70 Mild intellectual disabilities: Secondary | ICD-10-CM | POA: Diagnosis present

## 2014-09-23 DIAGNOSIS — F603 Borderline personality disorder: Secondary | ICD-10-CM | POA: Diagnosis present

## 2014-09-23 DIAGNOSIS — Z79899 Other long term (current) drug therapy: Secondary | ICD-10-CM | POA: Diagnosis not present

## 2014-09-23 DIAGNOSIS — F419 Anxiety disorder, unspecified: Secondary | ICD-10-CM | POA: Diagnosis present

## 2014-09-23 DIAGNOSIS — E785 Hyperlipidemia, unspecified: Secondary | ICD-10-CM | POA: Diagnosis present

## 2014-09-23 DIAGNOSIS — F329 Major depressive disorder, single episode, unspecified: Secondary | ICD-10-CM | POA: Diagnosis not present

## 2014-09-23 DIAGNOSIS — G809 Cerebral palsy, unspecified: Secondary | ICD-10-CM

## 2014-09-23 DIAGNOSIS — J302 Other seasonal allergic rhinitis: Secondary | ICD-10-CM | POA: Diagnosis present

## 2014-09-23 DIAGNOSIS — J45909 Unspecified asthma, uncomplicated: Secondary | ICD-10-CM | POA: Diagnosis present

## 2014-09-23 DIAGNOSIS — J449 Chronic obstructive pulmonary disease, unspecified: Secondary | ICD-10-CM | POA: Diagnosis present

## 2014-09-23 DIAGNOSIS — K219 Gastro-esophageal reflux disease without esophagitis: Secondary | ICD-10-CM | POA: Diagnosis present

## 2014-09-23 DIAGNOSIS — G8929 Other chronic pain: Secondary | ICD-10-CM | POA: Diagnosis present

## 2014-09-23 DIAGNOSIS — Z791 Long term (current) use of non-steroidal anti-inflammatories (NSAID): Secondary | ICD-10-CM | POA: Diagnosis not present

## 2014-09-23 DIAGNOSIS — F4323 Adjustment disorder with mixed anxiety and depressed mood: Secondary | ICD-10-CM

## 2014-09-23 LAB — LIPID PANEL
Cholesterol: 197 mg/dL (ref 0–200)
HDL: 52 mg/dL (ref >40)
LDL CALC: 109 mg/dL — AB (ref 0–99)
Total CHOL/HDL Ratio: 3.8 RATIO
Triglycerides: 180 mg/dL — ABNORMAL HIGH (ref <150)
VLDL: 36 mg/dL (ref 0–40)

## 2014-09-23 LAB — CBC
HEMATOCRIT: 39.1 % (ref 35.0–47.0)
Hemoglobin: 13.2 g/dL (ref 12.0–16.0)
MCH: 32.6 pg (ref 26.0–34.0)
MCHC: 33.9 g/dL (ref 32.0–36.0)
MCV: 96.3 fL (ref 80.0–100.0)
PLATELETS: 314 10*3/uL (ref 150–440)
RBC: 4.06 MIL/uL (ref 3.80–5.20)
RDW: 14 % (ref 11.5–14.5)
WBC: 10.3 10*3/uL (ref 3.6–11.0)

## 2014-09-23 LAB — BASIC METABOLIC PANEL
ANION GAP: 11 (ref 5–15)
BUN: 25 mg/dL — ABNORMAL HIGH (ref 6–20)
CHLORIDE: 107 mmol/L (ref 101–111)
CO2: 27 mmol/L (ref 22–32)
Calcium: 9.4 mg/dL (ref 8.9–10.3)
Creatinine, Ser: 0.97 mg/dL (ref 0.44–1.00)
GFR calc Af Amer: >60 mL/min (ref >60)
GFR calc non Af Amer: >60 mL/min (ref >60)
Glucose, Bld: 91 mg/dL (ref 65–99)
Potassium: 3.6 mmol/L (ref 3.5–5.1)
Sodium: 145 mmol/L (ref 135–145)

## 2014-09-23 LAB — ETHANOL: Alcohol, Ethyl (B): <5 mg/dL (ref <5)

## 2014-09-23 LAB — SALICYLATE LEVEL: Salicylate Lvl: <4 mg/dL (ref 2.8–30.0)

## 2014-09-23 LAB — ACETAMINOPHEN LEVEL: Acetaminophen (Tylenol), Serum: <10 ug/mL — ABNORMAL LOW (ref 10–30)

## 2014-09-23 MED ORDER — ESCITALOPRAM OXALATE 10 MG PO TABS
10.0000 mg | ORAL_TABLET | Freq: Every day | ORAL | Status: DC
  Administered 2014-09-23: 10 mg via ORAL
  Filled 2014-09-23: qty 1

## 2014-09-23 MED ORDER — BUSPIRONE HCL 10 MG PO TABS
10.0000 mg | ORAL_TABLET | Freq: Three times a day (TID) | ORAL | Status: DC
  Administered 2014-09-23 – 2014-09-24 (×2): 10 mg via ORAL
  Filled 2014-09-23 (×2): qty 1

## 2014-09-23 MED ORDER — MOMETASONE FURO-FORMOTEROL FUM 100-5 MCG/ACT IN AERO
2.0000 | INHALATION_SPRAY | Freq: Two times a day (BID) | RESPIRATORY_TRACT | Status: DC
  Administered 2014-09-23 – 2014-09-24 (×2): 2 via RESPIRATORY_TRACT
  Filled 2014-09-23: qty 8.8

## 2014-09-23 MED ORDER — ARIPIPRAZOLE 10 MG PO TABS
20.0000 mg | ORAL_TABLET | Freq: Every day | ORAL | Status: DC
  Administered 2014-09-24 – 2014-09-25 (×2): 20 mg via ORAL
  Filled 2014-09-23 (×2): qty 2

## 2014-09-23 MED ORDER — MAGNESIUM HYDROXIDE 400 MG/5ML PO SUSP: 30.0000 mL | Freq: Every day | ORAL | Status: DC | PRN

## 2014-09-23 MED ORDER — ALUM & MAG HYDROXIDE-SIMETH 200-200-20 MG/5ML PO SUSP: 30.0000 mL | ORAL | Status: DC | PRN

## 2014-09-23 MED ORDER — FENOFIBRATE 54 MG PO TABS
54.0000 mg | ORAL_TABLET | Freq: Every day | ORAL | Status: DC
  Administered 2014-09-24 – 2014-09-25 (×2): 54 mg via ORAL
  Filled 2014-09-23 (×4): qty 1

## 2014-09-23 MED ORDER — ALBUTEROL SULFATE HFA 108 (90 BASE) MCG/ACT IN AERS
2.0000 | INHALATION_SPRAY | RESPIRATORY_TRACT | Status: DC | PRN
  Filled 2014-09-23: qty 6.7

## 2014-09-23 MED ORDER — ACETAMINOPHEN 325 MG PO TABS: 650.0000 mg | ORAL_TABLET | Freq: Four times a day (QID) | ORAL | Status: DC | PRN

## 2014-09-23 NOTE — Progress Notes (Signed)
LCSW called pts group home. Ms [name] [phone] reported patient can return, she was fine all day but after speaking to family she stated she was suicidal so care provider brought her to ED. [facility] and sees her therapist  Morrie Sheldonshley 2x a month.

## 2014-09-23 NOTE — Progress Notes (Signed)
LCSW reviewed patient assessment and [facility] and [facility]. Awaiting psychiatric consult.
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
## 2014-09-23 NOTE — Tx Team (Signed)
Initial Interdisciplinary Treatment Plan   PATIENT STRESSORS: Financial difficulties Marital or family conflict   PATIENT STRENGTHS: General fund of knowledge Motivation for treatment/growth   PROBLEM LIST: Problem List/Patient Goals Date to be addressed Date deferred Reason deferred Estimated date of resolution  depression      Suicidal ideation                                                 DISCHARGE CRITERIA:  Adequate post-discharge living arrangements Verbal commitment to aftercare and medication compliance  PRELIMINARY DISCHARGE PLAN: Outpatient therapy Placement in alternative living arrangements  PATIENT/FAMIILY INVOLVEMENT: This treatment plan has been presented to and reviewed with the patient, [name].  The patient and family have been given the opportunity to ask questions and make suggestions.  Leanda Padmore, Sarajane MarekWendy Ann 09/23/2014, 6:06 PM

## 2014-09-23 NOTE — Progress Notes (Signed)
Patient admitted to Behavioral Med Unit from ED under IVC due to SI with plan to walk into traffic. She states that her main stressor is that she doesn't like her group home and would like to change to another one. She reports auditory hallucinations to kill herself. Skin assessment completed. No contraband found. Patient oriented to unit. Continue to monitor.

## 2014-09-23 NOTE — ED Notes (Signed)
BEHAVIORAL HEALTH ROUNDING Patient sleeping: No. Patient alert and oriented: yes Behavior appropriate: Yes.   Nutrition and fluids offered: Yes  Toileting and hygiene offered: Yes  Sitter present: q15 min observations and security camera monitoring Law enforcement present: Yes [employer] 

## 2014-09-23 NOTE — ED Notes (Signed)

## 2014-09-23 NOTE — ED Notes (Signed)
BEHAVIORAL HEALTH ROUNDING Patient sleeping: No. Patient alert and oriented: yes Behavior appropriate: Yes.  ; If no, describe:  Nutrition and fluids offered: Yes  Toileting and hygiene offered: Yes  Sitter present: no Law enforcement present: Yes  

## 2014-09-23 NOTE — ED Notes (Signed)
BEHAVIORAL HEALTH ROUNDING Patient sleeping: Yes.   Patient alert and oriented: not applicable Behavior appropriate: Yes.    Nutrition and fluids offered: No Toileting and hygiene offered: No Sitter present: q15 minute observations and security camera monitoring Law enforcement present: Yes [employer] 

## 2014-09-23 NOTE — ED Provider Notes (Signed)
-----------------------------------------   8:08 AM on 09/23/2014 -----------------------------------------   BP 165/78 mmHg  Pulse 85  Temp(Src) 98.6 F (37 C) (Oral)  Ht 5\' 3"  (1.6 m)  Wt 206 lb (93.441 kg)  BMI 36.50 kg/m2  SpO2 96%  The patient had no acute events since last update.  Calm and cooperative at this time.  Disposition is pending per Psychiatry/Behavioral Medicine team recommendations.     Myrna Blazeravid Matthew Kalila Adkison, MD 09/23/14 [phone]

## 2014-09-23 NOTE — [facility] Counselor (Signed)
Pt. is to be admitted to [facility] by Dr. Toni Amendlapacs. Attending Physician will be Dr. Ardyth HarpsHernandez.  [facility]. [facility] WarrentonPhyllis.  Intake Paper Work has been signed and placed on pt. chart. ER staff Rivka Barbara( Glenda, ER Sect. & Dr. Marchelle FolksSchaevit ER MD) have been made aware of the admission.

## 2014-09-24 DIAGNOSIS — K219 Gastro-esophageal reflux disease without esophagitis: Secondary | ICD-10-CM | POA: Diagnosis present

## 2014-09-24 DIAGNOSIS — F70 Mild intellectual disabilities: Secondary | ICD-10-CM | POA: Diagnosis present

## 2014-09-24 DIAGNOSIS — F603 Borderline personality disorder: Secondary | ICD-10-CM | POA: Diagnosis present

## 2014-09-24 DIAGNOSIS — F331 Major depressive disorder, recurrent, moderate: Principal | ICD-10-CM

## 2014-09-24 DIAGNOSIS — J449 Chronic obstructive pulmonary disease, unspecified: Secondary | ICD-10-CM | POA: Diagnosis present

## 2014-09-24 MED ORDER — CLONAZEPAM 0.5 MG PO TABS: 0.5000 mg | ORAL_TABLET | Freq: Two times a day (BID) | ORAL | Status: DC

## 2014-09-24 MED ORDER — UMECLIDINIUM BROMIDE 62.5 MCG/INH IN AEPB: 1.0000 | INHALATION_SPRAY | Freq: Every day | RESPIRATORY_TRACT | Status: DC

## 2014-09-24 MED ORDER — PANTOPRAZOLE SODIUM 40 MG PO TBEC
40.0000 mg | DELAYED_RELEASE_TABLET | Freq: Every day | ORAL | Status: DC
  Administered 2014-09-24 – 2014-09-25 (×2): 40 mg via ORAL
  Filled 2014-09-24 (×2): qty 1

## 2014-09-24 MED ORDER — MELOXICAM 7.5 MG PO TABS
15.0000 mg | ORAL_TABLET | Freq: Every day | ORAL | Status: DC
  Administered 2014-09-24 – 2014-09-25 (×2): 15 mg via ORAL
  Filled 2014-09-24 (×2): qty 2

## 2014-09-24 MED ORDER — FLUTICASONE PROPIONATE 50 MCG/ACT NA SUSP
1.0000 | Freq: Every day | NASAL | Status: DC | PRN
  Filled 2014-09-24: qty 16

## 2014-09-24 MED ORDER — MOMETASONE FURO-FORMOTEROL FUM 100-5 MCG/ACT IN AERO
2.0000 | INHALATION_SPRAY | Freq: Two times a day (BID) | RESPIRATORY_TRACT | Status: DC
  Administered 2014-09-24 – 2014-09-25 (×2): 2 via RESPIRATORY_TRACT
  Filled 2014-09-24: qty 8.8

## 2014-09-24 MED ORDER — ESCITALOPRAM OXALATE 10 MG PO TABS
20.0000 mg | ORAL_TABLET | Freq: Every day | ORAL | Status: DC
  Administered 2014-09-24: 20 mg via ORAL
  Filled 2014-09-24: qty 2

## 2014-09-24 MED ORDER — TIOTROPIUM BROMIDE MONOHYDRATE 18 MCG IN CAPS
18.0000 ug | ORAL_CAPSULE | Freq: Every day | RESPIRATORY_TRACT | Status: DC
  Administered 2014-09-24 – 2014-09-25 (×2): 18 ug via RESPIRATORY_TRACT
  Filled 2014-09-24: qty 5

## 2014-09-24 MED ORDER — CLONAZEPAM 0.5 MG PO TABS
0.5000 mg | ORAL_TABLET | Freq: Three times a day (TID) | ORAL | Status: DC
  Administered 2014-09-24 – 2014-09-25 (×3): 0.5 mg via ORAL
  Filled 2014-09-24 (×3): qty 1

## 2014-09-24 NOTE — Progress Notes (Signed)
Patient has had flat, anxious affect today. She states that someone at her group home told her she didn't care if patient came back there to live or not. Patient now is focused on getting list of group homes and trying to find alternative housing. Encouraged patient to talk further with this group home staff to see if they can come to an understanding. Patient denies SI. Attending some groups with encouragement. Continue to monitor.

## 2014-09-24 NOTE — [facility] Group Notes (Signed)
[facility] LCSW Group Therapy  09/24/2014 1:03 PM  Type of Therapy:  Group Therapy  Participation Level:  Active  Participation Quality:  Appropriate  Affect:  Appropriate  Cognitive:  Appropriate  Insight:  Engaged  Engagement in Therapy:  Engaged  Modes of Intervention:  Discussion, Education, Exploration and Support  Summary of Progress/Problems:Group Therapy  was centered on suicide prevention and intervention and resources in the community several handouts provided to patient. Patient was able to share and follow discussion and reflected on personal experiences. Pt was able to be supportive of peers. Patient stated she is still at times having thoughts of suicide when stressed out but she isnt going to do anything  [name], Kohl Polinsky M 09/24/2014, 1:03 PM

## 2014-09-24 NOTE — H&P (Signed)
Psychiatric Admission Assessment Adult  [name] MRN:  [id number] Date of Evaluation:  09/24/2014 Chief Complaint:  psychosis Principal Diagnosis: Major depressive disorder, recurrent episode, moderate Diagnosis:   Patient Active Problem List   Diagnosis Date Noted  . 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 GERD (gastroesophageal reflux disease) [K21.9] 09/24/2014  . COPD (chronic obstructive pulmonary disease) [J44.9] 09/24/2014  . Borderline personality disorder [F60.3] 09/24/2014  . Major depressive disorder, recurrent episode, moderate [F33.1] 09/24/2014  . Cerebral palsy [G80.9] 08/31/2014   History of Present Illness: Per ER assessment: Apparently she called crisis at [facility] yesterday and they told her to come to the emergency room. Patient reports that she's having auditory hallucinations. She said the voices sound like min telling her that she should kill herself. They've been going on for about 2 days. Mood is been anxious. Sleep has been impaired.  The patient is a 49 year old single Caucasian female with a history of cerebral palsy and mild mental retardation along with depression and borderline personality disorder. The patient presented voluntarily from her group home to our emergency department requesting psychiatric evaluation for her "bad nerves" and because she wanted to change group homes.  Patient is states that she has been living in this group home since March 1. She is unhappy because there are bugs and roaches in the home. The patient states that the house has been sprayed by an exterminator at least 3 times but she still finds bugs in her room (she points on her arm where she has one bug bite).  The patient is states she has a boyfriend who lives in a different group home and she would like to move to that group home so she can see him more often. Today she denies suicidality, homicidality or having auditory or visual hallucinations.  She however complains of having "really bad nerves".  She requests medication to help her calm down "I need something is strong and new".  Patient denies having major problems with her sleep, appetite, energy or concentration.   Elements:  Severity:  Severe. Timing:  Chronic with acute exacerbation. Duration:  2 days. Context:  Poor coping. Associated Signs/Symptoms: Depression Symptoms:  depressed mood, insomnia, psychomotor agitation,  Total Time spent with patient: 1 hour  Past Medical History:  Past Medical History  Diagnosis Date  . Asthma     Past Surgical History  Procedure Laterality Date  . Cholecystectomy    . Tonsillectomy     Family History: History reviewed. No pertinent family history.   Social History:  History  Alcohol Use No     History  Drug Use No    History   Social History  . Marital Status: Single    Spouse Name: N/A  . Number of Children: N/A  . Years of Education: N/A   Social History Main Topics  . Smoking status: Never Smoker   . Smokeless tobacco: Not on file  . Alcohol Use: No  . Drug Use: No  . Sexual Activity: Not on file   Other Topics Concern  . None   Social History Narrative    Musculoskeletal: Strength & Muscle Tone: spastic Gait & Station: unsteady Patient leans: N/A  Psychiatric Specialty Exam: Physical Exam  Review of Systems  Constitutional: Negative.   HENT: Negative.   Eyes: Negative.   Respiratory: Negative.   Cardiovascular: Negative.   Gastrointestinal: Negative.   Genitourinary: Negative.   Musculoskeletal: Negative.   Skin: Negative.   Neurological: Negative.  Endo/Heme/Allergies: Negative.   Psychiatric/Behavioral: Positive for depression. The patient is nervous/anxious.     Blood pressure 169/129, pulse 100, temperature 98.9 F (37.2 C), temperature source Oral, resp. rate 20, height  (1.626 m), weight 98.884 kg (218 lb).Body mass index is 37.4 kg/(m^2).  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 Well appearing and in no acute distress. Eyes: Conjunctivae are normal. PERRL. EOMI. Head: Atraumatic. Nose: No congestion/rhinnorhea. Mouth/Throat: Mucous membranes are moist. Oropharynx non-erythematous. Neck: No stridor.  Cardiovascular: Normal rate, regular rhythm. Grossly normal heart sounds. Good peripheral circulation. Respiratory: Normal respiratory effort. No retractions. Lungs CTAB. Gastrointestinal: Soft and nontender. No distention. No abdominal bruits. No CVA tenderness. Obese. Musculoskeletal: No lower extremity tenderness nor edema. No joint effusions. Neurologic: Normal speech and language. No gross focal neurologic deficits are appreciated. Speech is normal. No gait instability. Skin: Skin is warm, dry and intact. No rash noted. Psychiatric: Mood and affect are flat. She is calm, and not agitated   Risk to Self: Is patient at risk for suicide?: Yes Risk to Others:   Prior Inpatient Therapy:   Prior Outpatient Therapy:    Alcohol Screening: 1. How often do you have a drink containing alcohol?: Never 2. How many drinks containing alcohol do you have on a typical day when you are drinking?: 1 or  2 3. How often do you have six or more drinks on one occasion?: Never Preliminary Score: 0 4. How often during the last year have you found that you were not able to stop drinking once you had started?: Never 5. How often during the last year have you failed to do what was normally expected from you becasue of drinking?: Never 6. How often during the last year have you needed a first drink in the morning to get yourself going after a heavy drinking session?: Never 7. How often during the last year have you had a feeling of guilt of remorse after drinking?: Never 8. How often during the last year have you been unable to remember what happened the night before because you had been drinking?: Never 9. Have you or someone else been injured as a result of your drinking?: No 10. Has a relative or friend or a doctor or another health worker been concerned about your drinking or suggested you cut down?: No Alcohol Use Disorder Identification Test Final Score (AUDIT): 0 Brief Intervention: AUDIT score less than 7 or less-screening does not suggest unhealthy drinking-brief intervention not indicated  Allergies:   Allergies  Allergen Reactions  . Penicillins Other (See Comments)    Pt states that she is "just scared to take it".  Lab Results:  Results for orders placed or performed during the hospital encounter of 09/23/14 (from the past 48 hour(s))  Lipid panel, fasting     Status: Abnormal   Collection Time: 09/23/14  5:59 PM  Result Value Ref Range   Cholesterol 197 0 - 200 mg/dL   Triglycerides 628 (H) <150 mg/dL   HDL 52 >31 mg/dL   Total CHOL/HDL Ratio 3.8 RATIO   VLDL 36 0 - 40 mg/dL   LDL Cholesterol 517 (H) 0 - 99 mg/dL    Comment:        Total Cholesterol/HDL:CHD Risk Coronary Heart Disease Risk Table                     Men   Women  1/2 Average Risk   3.4   3.3  Average Risk       5.0   4.4  2 X Average Risk   9.6   7.1  3 X Average Risk  23.4   11.0        Use the  calculated Patient Ratio above and the CHD Risk Table to determine the patient's CHD Risk.        ATP III CLASSIFICATION (LDL):  <100     mg/dL   Optimal  616-073  mg/dL   Near or Above                    Optimal  130-159  mg/dL   Borderline  710-626  mg/dL   High  >948     mg/dL   Very High    Current Medications: Current Facility-Administered Medications  Medication Dose Route Frequency Provider Last Rate Last Dose  . acetaminophen (TYLENOL) tablet 650 mg  650 mg Oral Q6H PRN Audery Amel, MD      . albuterol (PROVENTIL HFA;VENTOLIN HFA) 108 (90 BASE) MCG/ACT inhaler 2 puff  2 puff Inhalation Q4H PRN Audery Amel, MD      . alum & mag hydroxide-simeth (MAALOX/MYLANTA) 200-200-20 MG/5ML suspension 30 mL  30 mL Oral Q4H PRN Audery Amel, MD      . ARIPiprazole (ABILIFY) tablet 20 mg  20 mg Oral Daily Audery Amel, MD   20 mg at 09/24/14 0926  . clonazePAM (KLONOPIN) tablet 0.5 mg  0.5 mg Oral TID Jimmy Footman, MD      . escitalopram (LEXAPRO) tablet 20 mg  20 mg Oral QHS Jimmy Footman, MD      . fenofibrate tablet 54 mg  54 mg Oral Daily Audery Amel, MD   54 mg at 09/24/14 0926  . fluticasone (FLONASE) 50 MCG/ACT nasal spray 1-2 spray  1-2 spray Each Nare Daily PRN Jimmy Footman, MD      . magnesium hydroxide (MILK OF MAGNESIA) suspension 30 mL  30 mL Oral Daily PRN Audery Amel, MD      . meloxicam (MOBIC) tablet 15 mg  15 mg Oral Daily Jimmy Footman, MD      . mometasone-formoterol (DULERA) 100-5 MCG/ACT inhaler 2 puff  2 puff Inhalation BID Jimmy Footman, MD      . pantoprazole (PROTONIX) EC tablet 40 mg  40 mg Oral Daily Jimmy Footman, MD      . tiotropium [facility]) inhalation capsule 18 mcg  18 mcg Inhalation Daily Jimmy Footman, MD       PTA Medications: Prescriptions prior to admission  Medication Sig Dispense Refill Last Dose  . albuterol (PROVENTIL HFA;VENTOLIN HFA)  108 (90  BASE) MCG/ACT inhaler Inhale 2 puffs into the lungs every 4 (four) hours as needed for wheezing or shortness of breath.   PRN at PRN  . ARIPiprazole (ABILIFY) 20 MG tablet Take 20 mg by mouth daily.   unknown at unknown  . busPIRone (BUSPAR) 10 MG tablet Take 10 mg by mouth 3 (three) times daily.   unknown at unknown  . clonazePAM (KLONOPIN) 0.5 MG tablet Take 0.5 mg by mouth 2 (two) times daily.   unknown at unknown  . escitalopram (LEXAPRO) 10 MG tablet Take 10 mg by mouth at bedtime.   unknown at unknown  . fenofibrate 54 MG tablet Take 54 mg by mouth daily.   unknown at unknown  . fluticasone (FLONASE) 50 MCG/ACT nasal spray Place 1-2 sprays into both nostrils daily as needed for allergies or rhinitis.    PRN at PRN  . Fluticasone-Salmeterol (ADVAIR) 100-50 MCG/DOSE AEPB Inhale 1 puff into the lungs 2 (two) times daily.   unknown at unknown  . Hypromellose (ARTIFICIAL TEARS OP) Apply 2 drops to eye 3 (three) times daily.   unknown at unknown  . meloxicam (MOBIC) 15 MG tablet Take 15 mg by mouth daily.   unknown at unknown  . pantoprazole (PROTONIX) 40 MG tablet Take 40 mg by mouth daily.   unknown at unknown  . Umeclidinium Bromide 62.5 MCG/INH AEPB Inhale 1 puff into the lungs daily.   unknown at unknown    Previous Psychotropic Medications: Yes   Substance Abuse History in the last 12 months:  No.    Consequences of Substance Abuse: NA  Results for orders placed or performed during the hospital encounter of 09/23/14 (from the past 72 hour(s))  Lipid panel, fasting     Status: Abnormal   Collection Time: 09/23/14  5:59 PM  Result Value Ref Range   Cholesterol 197 0 - 200 mg/dL   Triglycerides 161 (H) <150 mg/dL   HDL 52 >09 mg/dL   Total CHOL/HDL Ratio 3.8 RATIO   VLDL 36 0 - 40 mg/dL   LDL Cholesterol 604 (H) 0 - 99 mg/dL    Comment:        Total Cholesterol/HDL:CHD Risk Coronary Heart Disease Risk Table                     Men   Women  1/2 Average Risk   3.4   3.3   Average Risk       5.0   4.4  2 X Average Risk   9.6   7.1  3 X Average Risk  23.4   11.0        Use the calculated Patient Ratio above and the CHD Risk Table to determine the patient's CHD Risk.        ATP III CLASSIFICATION (LDL):  <100     mg/dL   Optimal  540-981  mg/dL   Near or Above                    Optimal  130-159  mg/dL   Borderline  191-478  mg/dL   High  >295     mg/dL   Very High     Psychological Evaluations: No   Treatment Plan Summary: Daily contact with patient to assess and evaluate symptoms and progress in treatment and Medication management   Major depressive disorder and borderline personality disorder: Continue Lexapro but I will increase the dose to 20 mg by mouth  daily. Continue Abilify 20 mg by mouth daily.  Anxiety: Continue clonazepam 0.5 mg but I will increase the dose from twice a day to 3 times a day as patient is complaining of severe anxiety  COPD: Continue albuterol when necessary. Continue Dulera and Spiriva  GERD continue Protonix  Seasonal allergies continue Flonase  Hyper lipidemia continue fenofibrate  Chronic pain continue Mobic daily.  Precautions continue every 15 minute checks.  Hospitalization and status continue voluntary admission  Labs I will check TSH.  Discharge planning: Once a stable discharge back to her group home and she will continue to follow-up with [facility].   Medical Decision Making:  Established Problem, Worsening (2)  I certify that inpatient services furnished can reasonably be expected to improve the patient's condition.   Jimmy Footman 6/9/20162:14 PM

## 2014-09-24 NOTE — Progress Notes (Signed)
Recreation Therapy Notes  Date: 06.09.16 Time: 3:00 pm Location: Craft Room  Group Topic: Leisure Education  Goal Area(s) Addresses:  Patient will identify activities for each letter of the alphabet. Patient will verbalize ability to integrate positive leisure into life post d/c. Patient will verbalize ability to use leisure as a coping skill.  Behavioral Response: Attentive  Intervention: Leisure Alphabet  Activity: Patients were given a worksheet with the alphabet on it and instructed to list healthy leisure activities for each letter of the alphabet.  Education: LRT educated patient on what is needed to participate in leisure  Education Outcome: Acknowledges education/In group clarification offered   Clinical Observations/Feedback: Patient did not participate in group activity. Patient did not contribute to group discussion.  Dayleen Beske M, LRT/CTRS 09/24/2014 4:23 PM 

## 2014-09-24 NOTE — [facility] Group Notes (Signed)
[facility] Group Notes:  (Nursing/MHT/Case Management/Adjunct)  Date:  09/24/2014  Time:  2:53 PM  Type of Therapy:  Group Therapy  Participation Level:  Did Not Attend  Summary of Progress/Problems:  [name] [name] [name] 09/24/2014, 2:53 PM

## 2014-09-24 NOTE — Tx Team (Signed)
Interdisciplinary Treatment Plan Update (Adult)  Date:  09/24/2014 Time Reviewed:  4:36 PM  Progress in Treatment: Attending groups: No. Participating in groups:  No. Taking medication as prescribed:  Yes. Tolerating medication:  Yes. Family/Significant othe contact made:  No, will contact:  when permited Patient understands diagnosis:  No. Discussing patient identified problems/goals with staff:  Yes. Medical problems stabilized or resolved:  Yes. Denies suicidal/homicidal ideation: No. Issues/concerns per patient self-inventory:  No. Other:  New problem(s) identified: No, Describe:     Discharge Plan or Barriers:Discharge home to [facility] follow up with [facility] Blocker.  Reason for Continuation of Hospitalization: Suicidal ideation  Comments:Apparently she called crisis at [facility] yesterday and they told her to come to the emergency room. Patient reports that she's having auditory hallucinations. She said the voices sound like min telling her that she should kill herself. They've been going on for about 2 days. Mood is been anxious. Sleep has been impaired.  The patient is a 49 year old single Caucasian female with a history of cerebral palsy and mild mental retardation along with depression and borderline personality disorder. The patient presented voluntarily from her group home to our emergency department requesting psychiatric evaluation for her "bad nerves" and because she wanted to change group homes. Patient is states that she has been living in this group home since March 1. She is unhappy because there are bugs and roaches in the home. The patient states that the house has been sprayed by an exterminator at least 3 times but she still finds bugs in her room (she points on her arm where she has one bug bite). The patient is states she has a boyfriend who lives in a different group home and she would like to move to that group home so she can see him more  often. Today she denies suicidality, homicidality or having auditory or visual hallucinations. She however complains of having "really bad nerves". She requests medication to help her calm down "I need something is strong and new". Patient denies having major problems with her sleep, appetite, energy or concentration  Estimated length of stay: up to 3 days  New goal(s):None  Review of initial/current patient goals per problem list:   SEE PLAN OF CARE  Attendees: Patient:  [name] 6/9/20164:36 PM  Family:   6/9/20164:36 PM  Physician:  Radene Journey, MD 6/9/20164:36 PM  Nursing:    6/9/20164:36 PM  Case Manager:   6/9/20164:36 PM  Counselor:  Jake Shark, LCSW 6/9/20164:36 PM  Other:  Arrie Senate, LCSW 6/9/20164:36 PM  Other:  Hershal Coria, LRT 6/9/20164:36 PM  Other:  Charleston Ropes, LCSWA 6/9/20164:36 PM  Other:  6/9/20164:36 PM  Other:  6/9/20164:36 PM  Other:  6/9/20164:36 PM  Other:  6/9/20164:36 PM  Other:  6/9/20164:36 PM  Other:  6/9/20164:36 PM  Other:   6/9/20164:36 PM   Scribe for Treatment Team:   Jake Shark P,LCSW 09/24/2014, 4:36 PM

## 2014-09-24 NOTE — [facility] Suicide Risk Assessment (Signed)
[facility] Admission Suicide Risk Assessment   Nursing information obtained from:  Patient Demographic factors:  Caucasian, Unemployed, Low socioeconomic status Current Mental Status:  Suicidal ideation indicated by patient, Suicide plan Loss Factors:  Decline in physical health Historical Factors:  Prior suicide attempts Risk Reduction Factors:  Religious beliefs about death, Living with another person, especially a relative, Positive therapeutic relationship Total Time spent with patient: 1 hour Principal Problem: Major depressive disorder, recurrent episode, moderate Diagnosis:   Patient Active Problem List   Diagnosis Date Noted  . 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 GERD (gastroesophageal reflux disease) [K21.9] 09/24/2014  . COPD (chronic obstructive pulmonary disease) [J44.9] 09/24/2014  . Borderline personality disorder [F60.3] 09/24/2014  . Major depressive disorder, recurrent episode, moderate [F33.1] 09/24/2014  . Cerebral palsy [G80.9] 08/31/2014     Continued Clinical Symptoms:  Alcohol Use Disorder Identification Test Final Score (AUDIT): 0 The "Alcohol Use Disorders Identification Test", Guidelines for Use in Primary Care, Second Edition.  World Science writer [facility]). Score between 0-7:  no or low risk or alcohol related problems. Score between 8-15:  moderate risk of alcohol related problems. Score between 16-19:  high risk of alcohol related problems. Score 20 or above:  warrants further diagnostic evaluation for alcohol dependence and treatment.   CLINICAL FACTORS:   Severe Anxiety and/or Agitation Depression:   Impulsivity Personality Disorders:   Cluster B Previous Psychiatric Diagnoses and Treatments   Musculoskeletal: Strength & Muscle Tone: within normal limits Gait & Station: unsteady Patient leans: N/A  Psychiatric Specialty Exam: Physical Exam  ROS                                                            COGNITIVE FEATURES THAT CONTRIBUTE TO RISK:  Closed-mindedness    SUICIDE RISK:   Moderate:  Frequent suicidal ideation with limited intensity, and duration, some specificity in terms of plans, no associated intent, good self-control, limited dysphoria/symptomatology, some risk factors present, and identifiable protective factors, including available and accessible social support.  PLAN OF CARE: admit to [facility]  Medical Decision Making:  Established Problem, Worsening (2)  I certify that inpatient services furnished can reasonably be expected to improve the patient's condition.   Jimmy Footman 09/24/2014, 2:50 PM

## 2014-09-24 NOTE — Plan of Care (Signed)
Problem: Diagnosis: Increased Risk For Suicide Attempt Goal: LTG-Patient Will Report Absence of Withdrawal Symptoms LTG (by discharge): Patient will report absence of withdrawal symptoms.  Outcome: Not Applicable Date Met:  99/14/44 No evidence of withdrawal

## 2014-09-24 NOTE — Progress Notes (Signed)
LCSW forwarded a hand off sheet to LCSW Huntley Dec and provided [facility] provider information Wonda Amis [phone] Patient is ABLE to return home. Pt is followed by [facility] Dr [facility] and see's a therapist [name] 2x month.

## 2014-09-24 NOTE — Progress Notes (Signed)
Pt is asleep in bed this evening. Pt mood is depressed and her affect is sad. Pt endorses passive SI and AH of voices telling her to harm herself, but she does contract for safety. Pt has no complaints but is trying to reach her group home so that they will bring more clothing. Pt is taking medications as prescribed and is pleasant and cooperative with staff.

## 2014-09-24 NOTE — Progress Notes (Signed)
Recreation Therapy Notes  INPATIENT RECREATION THERAPY ASSESSMENT  Patient Details Name: [name] MRN: [id number] DOB: Dec 17, 1965 Today's Date: 09/24/2014  Patient Stressors: Other (Comment) (Bugs and roaches in the group home)  Coping Skills:   Isolate, Avoidance, Talking, Music  Personal Challenges: Communication, Anger, Relationships, Self-Esteem/Confidence  Leisure Interests (2+):  Music - Listen, Individual - Other (Comment) (Word search puzzles)  Awareness of Community Resources:  Yes  Community Resources:  Tree surgeon, Other (Comment) (Book store)  Current Use: Yes  If no, Barriers?:    Patient Strengths:  Personality, nice person  Patient Identified Areas of Improvement:  No  Current Recreation Participation:  Riding  Patient Goal for Hospitalization:  To find a new home  [place] of Residence:  [place] of Residence:  [place]   Current [place] (including self-harm):  Yes  Current HI:  Yes [name]  Consent to Intern Participation: N/A   Jacquelynn Cree, LRT/CTRS 09/24/2014, 1:24 PM

## 2014-09-25 MED ORDER — ARIPIPRAZOLE 10 MG PO TABS: 10.0000 mg | ORAL_TABLET | Freq: Every day | ORAL | Status: DC

## 2014-09-25 MED ORDER — ESCITALOPRAM OXALATE 20 MG PO TABS: 20.0000 mg | ORAL_TABLET | Freq: Every day | ORAL | Status: DC

## 2014-09-25 MED ORDER — LOPERAMIDE HCL 2 MG PO CAPS
2.0000 mg | ORAL_CAPSULE | Freq: Three times a day (TID) | ORAL | Status: DC
  Administered 2014-09-25: 2 mg via ORAL
  Filled 2014-09-25: qty 1

## 2014-09-25 MED ORDER — LORATADINE 10 MG PO TABS
10.0000 mg | ORAL_TABLET | Freq: Every day | ORAL | Status: DC
  Administered 2014-09-25: 10 mg via ORAL
  Filled 2014-09-25: qty 1

## 2014-09-25 MED ORDER — LORATADINE 10 MG PO TABS: 10.0000 mg | ORAL_TABLET | Freq: Every day | ORAL | Status: DC

## 2014-09-25 NOTE — Progress Notes (Signed)
AVS H&P Discharge Summary faxed to [facility] for hospital follow-up

## 2014-09-25 NOTE — Plan of Care (Signed)
Problem: [facility] Participation in Recreation Therapeutic Interventions Goal: STG-Patient will demonstrate improved self esteem by identif STG: Self-Esteem - Within 53treatment sessions, patient will verbalize at least 5 positive affirmation statements in one treatment session to increase self-esteem post d/c.  Outcome: Completed/Met Date Met:  09/25/14 Treatment Session 1; Completed 1 out of 1: At approximately 12:20 pm, LRT met with patient in craft room. Patient verbalized 5 positive affirmation statements. Patient reported it felt "good". LRT encouraged patient to continue saying positive affirmation statements.   Leonette Monarch, LRT/CTRS 06.10.16 1:39 pm

## 2014-09-25 NOTE — [facility] Group Notes (Signed)
[facility] LCSW Aftercare Discharge Planning Group Note  09/25/2014 10:08 AM  Participation Quality:  did not attend group  Affect:  n/a  Cognitive:  n/a  Insight:  n/a  Engagement in Group:  n/a  Modes of Intervention:  n/a  Summary of Progress/Problems:  [name] T 09/25/2014, 10:08 AM

## 2014-09-25 NOTE — [facility] Group Notes (Signed)
[facility] LCSW Group Therapy  09/25/2014 2:45 PM  Type of Therapy:  Group Therapy  Participation Level:  Minimal  Participation Quality:  Appropriate and Attentive  Affect:  Flat  Cognitive:  Alert  Insight:  Improving  Engagement in Therapy:  Improving  Modes of Intervention:  Socialization and Support  Summary of Progress/Problems: Patient was attentive during group discussion and was happy to go back to her group home but shared that she will be changing group homes in the next week and wants to spend time with her family.  Beryl Meager T 09/25/2014, 2:45 PM

## 2014-09-25 NOTE — [facility] Group Notes (Signed)
[facility] Group Notes:  (Nursing/MHT/Case Management/Adjunct)  Date:  09/25/2014  Time:  12:42 PM  Type of Therapy:  Psychoeducational Skills  Participation Level:  Did Not Attend  Mayra Neer 09/25/2014, 12:42 PM

## 2014-09-25 NOTE — Progress Notes (Signed)
  [facility] Adult Case Management Discharge Plan :  Will you be returning to the same living situation after discharge:  Yes,    At discharge, do you have transportation home?: Yes,   [facility] Do you have the ability to pay for your medications: Yes,     Release of information consent forms completed and in the chart;  Patient's signature needed at discharge.  Patient to Follow up at: Follow-up Information    Follow up with [facility]. Go on 10/02/2014.   Why:  12:00pm, , Hospital Follow up, Outpatient Medication Management, Therapy   Contact information:   [address] [place] [address] [phone]; [phone]      Patient denies SI/HI: Yes,       Safety Planning and Suicide Prevention discussed: Yes,     Have you used any form of tobacco in the last 30 days? (Cigarettes, Smokeless Tobacco, Cigars, and/or Pipes): No  Has patient been referred to the Quitline?: N/A patient is not a smoker   Glennon Mac, LCSW 09/25/2014, 5:00 PM

## 2014-09-25 NOTE — Discharge Summary (Signed)
Physician Discharge Summary Note  Patient:  [name] is an 49 y.o., female MRN:  [id number] DOB:  Apr 10, 1966 Patient phone:  [phone] (home)  Patient address:   [place] [address],  Total Time spent with patient: 30 minutes  Date of Admission:  09/23/2014 Date of Discharge: 09/25/2014  Reason for Admission:  Suicidal ideation  Principal Problem: Major depressive disorder, recurrent episode, moderate Discharge Diagnoses: Patient Active Problem List   Diagnosis Date Noted  . 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 GERD (gastroesophageal reflux disease) [K21.9] 09/24/2014  . COPD (chronic obstructive pulmonary disease) [J44.9] 09/24/2014  . Borderline personality disorder [F60.3] 09/24/2014  . Major depressive disorder, recurrent episode, moderate [F33.1] 09/24/2014  . Cerebral palsy [G80.9] 08/31/2014    Musculoskeletal: Strength & Muscle Tone: spastic Gait & Station: unsteady Patient leans: N/A  Psychiatric Specialty Exam: Physical Exam  Review of Systems  Constitutional: Negative.   HENT: Positive for congestion.   Eyes: Negative.   Respiratory: Negative.   Cardiovascular: Negative.   Gastrointestinal: Positive for abdominal pain and diarrhea.  Genitourinary: Negative.   Musculoskeletal: Negative.   Skin: Negative.   Neurological: Negative.   Psychiatric/Behavioral: Negative.     Blood pressure 122/78, pulse 97, temperature 97.4 F (36.3 C), temperature source Oral, resp. rate 20, height _0  (1.626 m), weight 98.884 kg (218 lb).Body mass index is 37.4 kg/(m^2).  General Appearance: Disheveled  Eye Contact::  Good  Speech:  Normal Rate  Volume:  Normal  Mood:  Euthymic  Affect:  Congruent  Thought Process:  Concrete  Orientation:  Full (Time, Place, and Person)  Thought Content:  Hallucinations: None  Suicidal Thoughts:  No  Homicidal Thoughts:  No  Memory:  Immediate;   Fair Recent;   Fair Remote;   Fair  Judgement:   Fair  Insight:  Fair  Psychomotor Activity:  Normal  Concentration:  Fair  Recall:  NA  Fund of Knowledge:Fair  Language: Fair  Akathisia:  No  Handed:    AIMS (if indicated):     Assets:  Financial Resources/Insurance  ADL's:  Intact  Cognition: WNL  Sleep:  Number of Hours: 7   Have you used any form of tobacco in the last 30 days? (Cigarettes, Smokeless Tobacco, Cigars, and/or Pipes): No  Has this patient used any form of tobacco in the last 30 days? (Cigarettes, Smokeless Tobacco, Cigars, and/or Pipes) No    History of Present Illness: Per ER assessment: Apparently she called crisis at [facility] yesterday and they told her to come to the emergency room. Patient reports that she's having auditory hallucinations. She said the voices sound like min telling her that she should kill herself. They've been going on for about 2 days. Mood is been anxious. Sleep has been impaired.  The patient is a 49 year old single Caucasian female with a history of cerebral palsy and mild mental retardation along with depression and borderline personality disorder. The patient presented voluntarily from her group home to our emergency department requesting psychiatric evaluation for her "bad nerves" and because she wanted to change group homes. Patient is states that she has been living in this group home since March 1. She is unhappy because there are bugs and roaches in the home. The patient states that the house has been sprayed by an exterminator at least 3 times but she still finds bugs in her room (she points on her arm where she has one bug bite). The patient is states she has a  boyfriend who lives in a different group home and she would like to move to that group home so she can see him more often. Today she denies suicidality, homicidality or having auditory or visual hallucinations. She however complains of having "really bad nerves". She requests medication to help her calm down "I need something is  strong and new". Patient denies having major problems with her sleep, appetite, energy or concentration.   Elements: Severity: Severe. Timing: Chronic with acute exacerbation. Duration: 2 days. Context: Poor coping. Associated Signs/Symptoms: Depression Symptoms: depressed mood, insomnia, psychomotor agitation,  Total Time spent with patient: 1 hour  Past Medical History:  Past Medical History  Diagnosis Date  . Asthma     Past Surgical History  Procedure Laterality Date  . Cholecystectomy    . Tonsillectomy     Family History: History reviewed. No pertinent family history.   Social History:  History  Alcohol Use No    History  Drug Use No    History   Social History  . Marital Status: Single    Spouse Name: N/A  . Number of Children: N/A  . Years of Education: N/A   Social History Main Topics  . Smoking status: Never Smoker   . Smokeless tobacco: Not on file  . Alcohol Use: No  . Drug Use: No  . Sexual Activity: Not on file   Other Topics Concern  . None   Social History Narrative         Hospital Course:   Major depressive disorder and borderline personality disorder: Continue Lexapro but I will increase the dose to 20 mg by mouth daily. Continue Abilify but we will decrease dose to 10 mg a day as patient appears restless this could be secondary to akathisia.. It is difficult to differentiate between akathisia and tardive dyskinesia as this patient also suffers from cerebral palsy.  Anxiety: Continue clonazepam 0.5 mg twice a day  COPD: Continue albuterol when necessary. Continue Dulera and Spiriva  GERD continue Protonix  Seasonal allergies continue Flonase.  Will also add Claritin 10 mg by mouth daily  Hyper lipidemia continue fenofibrate  Chronic pain continue Mobic daily.  This hospitalization was uneventful. The patient was calm pleasant and cooperative. She  participated in programming. On the day of the dishwasher she reported her mood as euthymic. Denied suicidality homicidality or auditory or visual hallucinations. There were no behavioral problems during her stay. There was no need for seclusion, restraints or forced medications.  Discharge disposition patient will be discharged back to her original group home in [facility].  Consults:  None  Significant Diagnostic Studies:  None  Discharge Vitals:   Blood pressure 122/78, pulse 97, temperature 97.4 F (36.3 C), temperature source Oral, resp. rate 20, height _0  (1.626 m), weight 98.884 kg (218 lb). Body mass index is 37.4 kg/(m^2).   Lab Results:    Results for JOSELLE, DEEDS (MRN [id number]) as of 09/25/2014 10:56  Ref. Range 09/22/2014 20:53 09/22/2014 23:00 09/23/2014 17:59  Sodium Latest Ref Range: 135-145 mmol/L 145    Potassium Latest Ref Range: 3.5-5.1 mmol/L 3.6    Chloride Latest Ref Range: 101-111 mmol/L 107    CO2 Latest Ref Range: 22-32 mmol/L 27    BUN Latest Ref Range: 6-20 mg/dL 25 (H)    Creatinine Latest Ref Range: 0.44-1.00 mg/dL 0.97    Calcium Latest Ref Range: 8.9-10.3 mg/dL 9.4    EGFR (Non-African Amer.) Latest Ref Range: >60 mL/min >60  EGFR (African American) Latest Ref Range: >60 mL/min >60    Glucose Latest Ref Range: 65-99 mg/dL 91    Anion gap Latest Ref Range: 5-15  11    Cholesterol Latest Ref Range: 0-200 mg/dL   197  Triglycerides Latest Ref Range: <150 mg/dL   180 (H)  HDL Cholesterol Latest Ref Range: >40 mg/dL   52  LDL (calc) Latest Ref Range: 0-99 mg/dL   109 (H)  VLDL Latest Ref Range: 0-40 mg/dL   36  Total CHOL/HDL Ratio Latest Units: RATIO   3.8  WBC Latest Ref Range: 3.6-11.0 K/uL 10.3    RBC Latest Ref Range: 3.80-5.20 MIL/uL 4.06    Hemoglobin Latest Ref Range: 12.0-16.0 g/dL 13.2    HCT Latest Ref Range: 35.0-47.0 % 39.1    MCV Latest Ref Range: 80.0-100.0 fL 96.3    MCH Latest Ref Range: 26.0-34.0 pg 32.6    MCHC  Latest Ref Range: 32.0-36.0 g/dL 33.9    RDW Latest Ref Range: 11.5-14.5 % 14.0    Platelets Latest Ref Range: 756-433 K/uL 295    Salicylate Lvl Latest Ref Range: 2.8-30.0 mg/dL <4.0    Acetaminophen (Tylenol), S Latest Ref Range: 10-30 ug/mL <10 (L)    Alcohol, Ethyl (B) Latest Ref Range: <5 mg/dL <5    Amphetamines, Ur Screen Latest Ref Range: NONE DETECTED   NONE DETECTED   Barbiturates, Ur Screen Latest Ref Range: NONE DETECTED   NONE DETECTED   Benzodiazepine, Ur Scrn Latest Ref Range: NONE DETECTED   NONE DETECTED   Cocaine Metabolite,Ur [place] Latest Ref Range: NONE DETECTED   NONE DETECTED   Methadone Scn, Ur Latest Ref Range: NONE DETECTED   NONE DETECTED   MDMA (Ecstasy)Ur Screen Latest Ref Range: NONE DETECTED   NONE DETECTED   Cannabinoid 50 Ng, Ur [place] Latest Ref Range: NONE DETECTED   NONE DETECTED   Opiate, Ur Screen Latest Ref Range: NONE DETECTED   NONE DETECTED   Phencyclidine (PCP) Ur S Latest Ref Range: NONE DETECTED   NONE DETECTED   Tricyclic, Ur Screen Latest Ref Range: NONE DETECTED   NONE DETECTED     See Psychiatric Specialty Exam and Suicide Risk Assessment completed by Attending Physician prior to discharge.  Discharge destination:  Other:  group home  Is patient on multiple antipsychotic therapies at discharge:  No   Has Patient had three or more failed trials of antipsychotic monotherapy by history:  No    Recommended Plan for Multiple Antipsychotic Therapies: NA  Discharge Instructions    Diet general    Complete by:  As directed             Medication List    STOP taking these medications        ARTIFICIAL TEARS OP     busPIRone 10 MG tablet  Commonly known as:  BUSPAR      TAKE these medications      Indication   albuterol 108 (90 BASE) MCG/ACT inhaler  Commonly known as:  PROVENTIL HFA;VENTOLIN HFA  Inhale 2 puffs into the lungs every 4 (four) hours as needed for wheezing or shortness of breath.  Notes to Patient:  COPD       ARIPiprazole 10 MG tablet  Commonly known as:  ABILIFY  Take 1 tablet (10 mg total) by mouth daily.  Start taking on:  09/26/2014  Notes to Patient:  Mood instability      clonazePAM 0.5 MG tablet  Commonly known as:  [place]  Take 0.5 mg by mouth 2 (two) times daily.  Notes to Patient:  Anxiety      escitalopram 20 MG tablet  Commonly known as:  LEXAPRO  Take 1 tablet (20 mg total) by mouth at bedtime.  Notes to Patient:  Depression and anxiety      fenofibrate 54 MG tablet  Take 54 mg by mouth daily.  Notes to Patient:  Cholesterol      fluticasone 50 MCG/ACT nasal spray  Commonly known as:  FLONASE  Place 1-2 sprays into both nostrils daily as needed for allergies or rhinitis.  Notes to Patient:  Allergic rhinitis      Fluticasone-Salmeterol 100-50 MCG/DOSE Aepb  Commonly known as:  ADVAIR  Inhale 1 puff into the lungs 2 (two) times daily.  Notes to Patient:  COPD      loratadine 10 MG tablet  Commonly known as:  CLARITIN  Take 1 tablet (10 mg total) by mouth daily.  Notes to Patient:  Allergies      meloxicam 15 MG tablet  Commonly known as:  MOBIC  Take 15 mg by mouth daily.  Notes to Patient:  Pain      pantoprazole 40 MG tablet  Commonly known as:  PROTONIX  Take 40 mg by mouth daily.  Notes to Patient:  Acid reflux      Umeclidinium Bromide 62.5 MCG/INH Aepb  Inhale 1 puff into the lungs daily.  Notes to Patient:  COPD          Follow-up recommendations:  Other:  Follow up with [facility] and return to group home  Comments:    Total Discharge Time: 30 minutes  Signed: Hildred Priest 09/25/2014, 10:52 AM

## 2014-09-25 NOTE — [facility] Suicide Risk Assessment (Signed)
[facility] INPATIENT:  Family/Significant Other Suicide Prevention Education  Suicide Prevention Education:  Education Completed; Neale Burly, Group Home Staff,  (name of family member/significant other) has been identified by the patient as the family member/significant other with whom the patient will be residing, and identified as the person(s) who will aid the patient in the event of a mental health crisis (suicidal ideations/suicide attempt).  With written consent from the patient, the family member/significant other has been provided the following suicide prevention education, prior to the and/or following the discharge of the patient.  The suicide prevention education provided includes the following:  Suicide risk factors  Suicide prevention and interventions  National Suicide Hotline telephone number  [facility] assessment telephone number  [facility] Emergency Assistance 911  [facility] and/or Residential Mobile Crisis Unit telephone number  Request made of family/significant other to:  Remove weapons (e.g., guns, rifles, knives), all items previously/currently identified as safety concern.    Remove drugs/medications (over-the-counter, prescriptions, illicit drugs), all items previously/currently identified as a safety concern.  The family member/significant other verbalizes understanding of the suicide prevention education information provided.  The family member/significant other agrees to remove the items of safety concern listed above.  Glennon Mac, LCSW 09/25/2014, 4:59 PM

## 2014-09-25 NOTE — Progress Notes (Addendum)
D: Patient denies HI/AVH.  Patient endorses passive SI but agrees to contract.  Patient affect is anxious and her mood is depressed.  Patient did NOT attend evening group. Patient remained in her room throughout the night.  No distress noted. A: Support and encouragement offered. Scheduled medications given to pt. Q 15 min checks continued for patient safety. R: Patient receptive. Patient remains safe on the unit.

## 2014-09-25 NOTE — Patient Care Conference (Signed)
Patient denies SI/HI, denies A/V hallucinations. Patient verbalizes understanding of discharge instructions, follow up care and prescriptions. Patient given all belongings from  locker. Patient escorted out by staff, transported by group home staff. 

## 2014-09-25 NOTE — [facility] Suicide Risk Assessment (Signed)
[facility] Discharge Suicide Risk Assessment   Demographic Factors:  Caucasian  Total Time spent with patient: 30 minutes  Musculoskeletal: Strength & Muscle Tone: spastic Gait & Station: unsteady Patient leans: N/A  Psychiatric Specialty Exam: Physical Exam  ROS                                                         Have you used any form of tobacco in the last 30 days? (Cigarettes, Smokeless Tobacco, Cigars, and/or Pipes): No  Has this patient used any form of tobacco in the last 30 days? (Cigarettes, Smokeless Tobacco, Cigars, and/or Pipes) No  Mental Status Per Nursing Assessment::   On Admission:  Suicidal ideation indicated by patient, Suicide plan  Current Mental Status by Physician: Denies depression, denies SI, HI or auditory or visual hallucinations. Mood is euthymic patient is hopeful and future oriented  Loss Factors: Loss of significant relationship  Historical Factors: Prior suicide attempts and Impulsivity  Risk Reduction Factors:   Positive social support  Continued Clinical Symptoms:  Depression:   Insomnia Personality Disorders:   Cluster B Previous Psychiatric Diagnoses and Treatments  Cognitive Features That Contribute To Risk:  Closed-mindedness    Suicide Risk:  Minimal: No identifiable suicidal ideation.  Patients presenting with no risk factors but with morbid ruminations; may be classified as minimal risk based on the severity of the depressive symptoms  Principal Problem: Major depressive disorder, recurrent episode, moderate Discharge Diagnoses:  Patient Active Problem List   Diagnosis Date Noted  . 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 GERD (gastroesophageal reflux disease) [K21.9] 09/24/2014  . COPD (chronic obstructive pulmonary disease) [J44.9] 09/24/2014  . Borderline personality disorder [F60.3] 09/24/2014  . Major depressive disorder, recurrent episode, moderate [F33.1] 09/24/2014  . Cerebral  palsy [G80.9] 08/31/2014      Plan Of Care/Follow-up recommendations:  Other:  Follow-up with [facility]. Return to group home  Is patient on multiple antipsychotic therapies at discharge:  No   Has Patient had three or more failed trials of antipsychotic monotherapy by history:  No  Recommended Plan for Multiple Antipsychotic Therapies: NA    Jimmy Footman 09/25/2014, 10:50 AM

## 2014-09-25 NOTE — Progress Notes (Signed)
Recreation Therapy Notes  INPATIENT RECREATION TR PLAN  Patient Details Name: Jackelin Correia MRN: [id number] DOB: Feb 25, 1966 Today's Date: 09/25/2014  Rec Therapy Plan Is patient appropriate for Therapeutic Recreation?: Yes Treatment times per week: At least once a week TR Treatment/Interventions: 1:1 session, Group participation (Comment) (Appropriate participation in daily recreation therapy tx)  Discharge Criteria Pt will be discharged from therapy if:: Discharged Treatment plan/goals/alternatives discussed and agreed upon by:: Patient/family  Discharge Summary Short term goals set: See Care Plan Short term goals met: Complete Progress toward goals comments: One-to-one attended Which groups?: Leisure education One-to-one attended: Self-esteem Reason goals not met: N/A Therapeutic equipment acquired: None Reason patient discharged from therapy: Discharge from hospital Pt/family agrees with progress & goals achieved: Yes Date patient discharged from therapy: 09/25/14   Leonette Monarch, LRT/CTRS 09/25/2014, 1:39 PM

## 2014-10-21 ENCOUNTER — Emergency Department
Admission: EM | Admit: 2014-10-21 | Discharge: 2014-10-21 | Disposition: A | Discharge status: Home / Self Care | Payer: Medicare Other | Attending: Emergency Medicine | Admitting: Emergency Medicine

## 2014-10-21 ENCOUNTER — Encounter: Payer: Self-pay | Admitting: Emergency Medicine

## 2014-10-21 DIAGNOSIS — Z8669 Personal history of other diseases of the nervous system and sense organs: Secondary | ICD-10-CM | POA: Insufficient documentation

## 2014-10-21 DIAGNOSIS — Z9089 Acquired absence of other organs: Secondary | ICD-10-CM | POA: Diagnosis not present

## 2014-10-21 DIAGNOSIS — Z7951 Long term (current) use of inhaled steroids: Secondary | ICD-10-CM | POA: Diagnosis not present

## 2014-10-21 DIAGNOSIS — M545 Low back pain, unspecified: Secondary | ICD-10-CM

## 2014-10-21 DIAGNOSIS — Z880 Allergy status to penicillin: Secondary | ICD-10-CM | POA: Diagnosis not present

## 2014-10-21 DIAGNOSIS — R109 Unspecified abdominal pain: Secondary | ICD-10-CM | POA: Diagnosis present

## 2014-10-21 DIAGNOSIS — Z791 Long term (current) use of non-steroidal anti-inflammatories (NSAID): Secondary | ICD-10-CM | POA: Diagnosis not present

## 2014-10-21 DIAGNOSIS — Z79899 Other long term (current) drug therapy: Secondary | ICD-10-CM | POA: Diagnosis not present

## 2014-10-21 DIAGNOSIS — Z8659 Personal history of other mental and behavioral disorders: Secondary | ICD-10-CM | POA: Insufficient documentation

## 2014-10-21 LAB — URINALYSIS COMPLETE WITH MICROSCOPIC (ARMC ONLY)
Bilirubin Urine: NEGATIVE
Glucose, UA: NEGATIVE mg/dL
Ketones, ur: NEGATIVE mg/dL
Nitrite: NEGATIVE
PH: 5 (ref 5.0–8.0)
Protein, ur: NEGATIVE mg/dL
Specific Gravity, Urine: 1.018 (ref 1.005–1.030)

## 2014-10-21 MED ORDER — IBUPROFEN 600 MG PO TABS: 600.0000 mg | ORAL_TABLET | Freq: Three times a day (TID) | ORAL | Status: DC

## 2014-10-21 MED ORDER — TRAMADOL HCL 50 MG PO TABS: 50.0000 mg | ORAL_TABLET | Freq: Three times a day (TID) | ORAL | Status: DC | PRN

## 2014-10-21 NOTE — ED Notes (Signed)
Pt alert and oriented X4, active, cooperative, pt in NAD. RR even and unlabored, color WNL.  Pt informed to return if any life threatening symptoms occur.  Pt calling group home for return to home.

## 2014-10-21 NOTE — ED Notes (Signed)
Pt presents with bilateral flank pain for three weeks. Denies any urinary symptoms.

## 2014-10-21 NOTE — ED Provider Notes (Signed)
[facility] Emergency Department Provider Note  ____________________________________________  Time seen: 10:40 AM  I have reviewed the triage vital signs and the nursing notes.   HISTORY  Chief Complaint Flank Pain     HPI [name] is a 49 y.o. female who is complaining of bilateral lower back pain today. This is been going on for 3-4 weeks. She reports a little bit worse on the right than the left. It hurts when she moves. She denies any weakness in her legs. She reports she has not walked much because walking makes her short of breath.   Patient denies any dysuria. She is not having any fever.    Past Medical History  Diagnosis Date  . Asthma     Patient Active Problem List   Diagnosis Date Noted  . 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 GERD (gastroesophageal reflux disease) 09/24/2014  . COPD (chronic obstructive pulmonary disease) 09/24/2014  . Borderline personality disorder 09/24/2014  . Major depressive disorder, recurrent episode, moderate 09/24/2014  . Cerebral palsy 08/31/2014    Past Surgical History  Procedure Laterality Date  . Cholecystectomy    . Tonsillectomy      Current Outpatient Rx  Name  Route  Sig  Dispense  Refill  . ARIPiprazole (ABILIFY) 10 MG tablet   Oral   Take 1 tablet (10 mg total) by mouth daily.   30 tablet   0   . clonazePAM (KLONOPIN) 0.5 MG tablet   Oral   Take 0.5 mg by mouth 2 (two) times daily.         Marland Kitchen escitalopram (LEXAPRO) 20 MG tablet   Oral   Take 1 tablet (20 mg total) by mouth at bedtime.   30 tablet   0   . fenofibrate 54 MG tablet   Oral   Take 54 mg by mouth daily.         . Fluticasone-Salmeterol (ADVAIR) 100-50 MCG/DOSE AEPB   Inhalation   Inhale 1 puff into the lungs 2 (two) times daily.         Marland Kitchen loratadine (CLARITIN) 10 MG tablet   Oral   Take 1 tablet (10 mg total) by mouth daily.   30 tablet   0   . meloxicam (MOBIC) 15 MG tablet   Oral   Take  15 mg by mouth daily.         . pantoprazole (PROTONIX) 40 MG tablet   Oral   Take 40 mg by mouth daily.         Marland Kitchen albuterol (PROVENTIL HFA;VENTOLIN HFA) 108 (90 BASE) MCG/ACT inhaler   Inhalation   Inhale 2 puffs into the lungs every 4 (four) hours as needed for wheezing or shortness of breath.         . fluticasone (FLONASE) 50 MCG/ACT nasal spray   Each Nare   Place 1-2 sprays into both nostrils daily as needed for allergies or rhinitis.          Marland Kitchen ibuprofen (ADVIL,MOTRIN) 600 MG tablet   Oral   Take 1 tablet (600 mg total) by mouth 3 (three) times daily.   12 tablet   0   . traMADol (ULTRAM) 50 MG tablet   Oral   Take 1 tablet (50 mg total) by mouth every 8 (eight) hours as needed.   12 tablet   0     Allergies Penicillins  No family history on file.  Social History History  Substance Use Topics  . Smoking  status: Never Smoker   . Smokeless tobacco: Not on file  . Alcohol Use: No    Review of Systems  Constitutional: Negative for fever. ENT: Negative for sore throat. Cardiovascular: Negative for chest pain. Respiratory: Negative for shortness of breath. Gastrointestinal: Negative for abdominal pain, vomiting and diarrhea. Genitourinary: Negative for dysuria. Musculoskeletal: lower back pain bilateral. See history of present illness Skin: Negative for rash. Neurological: Negative for headaches Psychological: Patient with a history of cerebral palsy, mental retardation, adjustment disorder. She was seen in the hospital on in June of this year. No acute changes.  10-point ROS otherwise negative.  ____________________________________________   PHYSICAL EXAM:  VITAL SIGNS: ED Triage Vitals  Enc Vitals Group     BP 10/21/14 0942 108/71 mmHg     Pulse Rate 10/21/14 0942 80     Resp 10/21/14 0942 18     Temp 10/21/14 0941 98.4 F (36.9 C)     Temp Source 10/21/14 0941 Oral     SpO2 10/21/14 0942 94 %     Weight 10/21/14 0941 215 lb (97.523  kg)     Height 10/21/14 0941 5\' 4"  (1.626 m)     Head Cir --      Peak Flow --      Pain Score 10/21/14 0943 10     Pain Loc --      Pain Edu? --      Excl. in GC? --     Constitutional:  Alert, interactive, communicative, no acute distress. ENT   Head: Normocephalic and atraumatic.   Nose: No congestion/rhinnorhea. Cardiovascular: Normal rate, regular rhythm, no murmur noted Respiratory:  Normal respiratory effort, no tachypnea.    Breath sounds are clear and equal bilaterally.  Gastrointestinal: Soft and nontender. No distention.  Back:patient has some tenderness in both sides of her back but worse on the right extending down to the right buttock's. There is some muscle spasm present in the paraspinal muscles. Straight leg raise is negative. Musculoskeletal: No deformity noted. Nontender with normal range of motion in all extremities.  No noted edema. Neurologic:  Normal speech and language. No gross focal neurologic deficits are appreciated.  Skin:  Skin is warm, dry. No rash noted. Psychiatric:  Alert, communicative, but with an odd affect and apparent limited cognition.  ____________________________________________    LABS (pertinent positives/negatives)  Urinalysis: Negative for infection. No white blood cells, no red blood cells, no ketones  ____________________________________________ ____________________________________________   INITIAL IMPRESSION / ASSESSMENT AND PLAN / ED COURSE  Pertinent labs & imaging results that were available during my care of the patient were reviewed by me and considered in my medical decision making (see chart for details).  49 year old female appears in her usual state. She has some discomfort as she moves and sits but no acute distress. This pain appears to be musculoskeletal in nature. It is been going on for approximately 3+ weeks. Her urinalysis is negative for infection.  We will prescribe ibuprofen and tramadol. I will ask  for her to follow-up with her primary doctor, Dr. Yves DillNeelam Khan  ____________________________________________   FINAL CLINICAL IMPRESSION(S) / ED DIAGNOSES  Final diagnoses:  Right-sided low back pain without sciatica      Darien Ramusavid W Jamall Strohmeier, MD 10/21/14 1102

## 2014-10-21 NOTE — Discharge Instructions (Signed)
Urinalysis looked good. There is no sign of urinary tract infection. This pain appears to be musculoskeletal. Take ibuprofen, 600 mg, 3 times a day, for 3 days. You may take tramadol, 50 mg, every 8 hours with the ibuprofen if needed for uncontrolled pain. Follow with her regular doctor. Return to the emergency department if you have any urgent concerns.  Back Pain, Adult Back pain is very common. The pain often gets better over time. The cause of back pain is usually not dangerous. Most people can learn to manage their back pain on their own.  HOME CARE   Stay active. Start with short walks on flat ground if you can. Try to walk farther each day.  Do not sit, drive, or stand in one place for more than 30 minutes. Do not stay in bed.  Do not avoid exercise or work. Activity can help your back heal faster.  Be careful when you bend or lift an object. Bend at your knees, keep the object close to you, and do not twist.  Sleep on a firm mattress. Lie on your side, and bend your knees. If you lie on your back, put a pillow under your knees.  Only take medicines as told by your doctor.  Put ice on the injured area.  Put ice in a plastic bag.  Place a towel between your skin and the bag.  Leave the ice on for 15-20 minutes, 03-04 times a day for the first 2 to 3 days. After that, you can switch between ice and heat packs.  Ask your doctor about back exercises or massage.  Avoid feeling anxious or stressed. Find good ways to deal with stress, such as exercise. GET HELP RIGHT AWAY IF:   Your pain does not go away with rest or medicine.  Your pain does not go away in 1 week.  You have new problems.  You do not feel well.  The pain spreads into your legs.  You cannot control when you poop (bowel movement) or pee (urinate).  Your arms or legs feel weak or lose feeling (numbness).  You feel sick to your stomach (nauseous) or throw up (vomit).  You have belly (abdominal)  pain.  You feel like you may pass out (faint). MAKE SURE YOU:   Understand these instructions.  Will watch your condition.  Will get help right away if you are not doing well or get worse. Document Released: 09/20/2007 Document Revised: 06/26/2011 Document Reviewed: 08/05/2013 [facility] Patient Information 2015 [place]. This information is not intended to replace advice given to you by your health care provider. Make sure you discuss any questions you have with your health care provider.

## 2014-10-25 ENCOUNTER — Emergency Department
Admission: EM | Admit: 2014-10-25 | Discharge: 2014-10-25 | Disposition: A | Discharge status: Home / Self Care | Payer: Medicare Other | Attending: Emergency Medicine | Admitting: Emergency Medicine

## 2014-10-25 ENCOUNTER — Encounter: Payer: Self-pay | Admitting: Emergency Medicine

## 2014-10-25 ENCOUNTER — Emergency Department: Payer: Medicare Other

## 2014-10-25 DIAGNOSIS — Y9389 Activity, other specified: Secondary | ICD-10-CM | POA: Diagnosis not present

## 2014-10-25 DIAGNOSIS — F70 Mild intellectual disabilities: Secondary | ICD-10-CM | POA: Diagnosis not present

## 2014-10-25 DIAGNOSIS — R52 Pain, unspecified: Secondary | ICD-10-CM

## 2014-10-25 DIAGNOSIS — Y9289 Other specified places as the place of occurrence of the external cause: Secondary | ICD-10-CM | POA: Insufficient documentation

## 2014-10-25 DIAGNOSIS — S0011XA Contusion of right eyelid and periocular area, initial encounter: Secondary | ICD-10-CM | POA: Insufficient documentation

## 2014-10-25 DIAGNOSIS — S0993XA Unspecified injury of face, initial encounter: Secondary | ICD-10-CM | POA: Diagnosis present

## 2014-10-25 DIAGNOSIS — Z791 Long term (current) use of non-steroidal anti-inflammatories (NSAID): Secondary | ICD-10-CM | POA: Diagnosis not present

## 2014-10-25 DIAGNOSIS — W06XXXA Fall from bed, initial encounter: Secondary | ICD-10-CM | POA: Diagnosis not present

## 2014-10-25 DIAGNOSIS — Z79899 Other long term (current) drug therapy: Secondary | ICD-10-CM | POA: Insufficient documentation

## 2014-10-25 DIAGNOSIS — Y998 Other external cause status: Secondary | ICD-10-CM | POA: Diagnosis not present

## 2014-10-25 DIAGNOSIS — Z880 Allergy status to penicillin: Secondary | ICD-10-CM | POA: Insufficient documentation

## 2014-10-25 DIAGNOSIS — S0083XA Contusion of other part of head, initial encounter: Secondary | ICD-10-CM

## 2014-10-25 NOTE — ED Notes (Signed)
NAD noted at time of D/C. Pt ambulatory to the lobby at this time.

## 2014-10-25 NOTE — ED Provider Notes (Signed)
[facility] Provider Note  ____________________________________________  Time seen:  10:06 AM  I have reviewed the triage vital signs and the nursing notes.   HISTORY  Chief Complaint Eye Pain   HPI [name] is a 49 y.o. female is here with complaint of right periorbital pain. She states she fell out of bed on Friday, 2 days ago. She is continued to have pain and bruising in that area. She denies any visual changes. She did not hit her head and there was no loss of consciousness.Patient is a member of a group home and therefore she did not drive. She is continued to eat, drink, and regular activities without any difficulty. She states her pain is still 10 over 10. She has not used any ice to the area and states nothing helps it.   Past Medical History  Diagnosis Date  . Asthma     Patient Active Problem List   Diagnosis Date Noted  . 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 GERD (gastroesophageal reflux disease) 09/24/2014  . COPD (chronic obstructive pulmonary disease) 09/24/2014  . Borderline personality disorder 09/24/2014  . Major depressive disorder, recurrent episode, moderate 09/24/2014  . Cerebral palsy 08/31/2014    Past Surgical History  Procedure Laterality Date  . Cholecystectomy    . Tonsillectomy      Current Outpatient Rx  Name  Route  Sig  Dispense  Refill  . albuterol (PROVENTIL HFA;VENTOLIN HFA) 108 (90 BASE) MCG/ACT inhaler   Inhalation   Inhale 2 puffs into the lungs every 4 (four) hours as needed for wheezing or shortness of breath.         . ARIPiprazole (ABILIFY) 10 MG tablet   Oral   Take 1 tablet (10 mg total) by mouth daily.   30 tablet   0   . clonazePAM (KLONOPIN) 0.5 MG tablet   Oral   Take 0.5 mg by mouth 2 (two) times daily.         Marland Kitchen. escitalopram (LEXAPRO) 20 MG tablet   Oral   Take 1 tablet (20 mg total) by mouth at bedtime.   30 tablet   0   . fenofibrate 54 MG  tablet   Oral   Take 54 mg by mouth daily.         . fluticasone (FLONASE) 50 MCG/ACT nasal spray   Each Nare   Place 1-2 sprays into both nostrils daily as needed for allergies or rhinitis.          . Fluticasone-Salmeterol (ADVAIR) 100-50 MCG/DOSE AEPB   Inhalation   Inhale 1 puff into the lungs 2 (two) times daily.         Marland Kitchen. ibuprofen (ADVIL,MOTRIN) 600 MG tablet   Oral   Take 1 tablet (600 mg total) by mouth 3 (three) times daily.   12 tablet   0   . loratadine (CLARITIN) 10 MG tablet   Oral   Take 1 tablet (10 mg total) by mouth daily.   30 tablet   0   . meloxicam (MOBIC) 15 MG tablet   Oral   Take 15 mg by mouth daily.         . pantoprazole (PROTONIX) 40 MG tablet   Oral   Take 40 mg by mouth daily.         . traMADol (ULTRAM) 50 MG tablet   Oral   Take 1 tablet (50 mg total) by mouth every 8 (eight) hours as needed.  12 tablet   0     Allergies Penicillins  No family history on file.  Social History History  Substance Use Topics  . Smoking status: Never Smoker   . Smokeless tobacco: Not on file  . Alcohol Use: No    Review of Systems Constitutional: No fever/chills Eyes: No visual changes. ENT: No sore throat. Cardiovascular: Denies chest pain. Respiratory: Denies shortness of breath. Gastrointestinal: No abdominal pain.  No nausea, no vomiting. Genitourinary: Negative for dysuria. Musculoskeletal: Negative for back pain. Skin: Negative for rash. Neurological: Negative for headaches, focal weakness or numbness.  10-point ROS otherwise negative.  ____________________________________________   PHYSICAL EXAM:  VITAL SIGNS: ED Triage Vitals  Enc Vitals Group     BP 10/25/14 0947 112/76 mmHg     Pulse Rate 10/25/14 0947 81     Resp 10/25/14 0947 18     Temp 10/25/14 0947 98.3 F (36.8 C)     Temp Source 10/25/14 0947 Oral     SpO2 --      Weight 10/25/14 0944 220 lb (99.791 kg)     Height 10/25/14 0944  (1.626 m)      Head Cir --      Peak Flow --      Pain Score 10/25/14 0945 10     Pain Loc --      Pain Edu? --      Excl. in GC? --     Constitutional: Alert and oriented. Well appearing and in no acute distress. Eyes: Conjunctivae are normal. PERRL. EOMI. no hyphema. Head: Atraumatic. Nose: No congestion/rhinnorhea. Neck: No stridor.  No cervical tenderness on palpation. Cardiovascular: Normal rate, regular rhythm. Grossly normal heart sounds.  Good peripheral circulation. Respiratory: Normal respiratory effort.  No retractions. Lungs CTAB. Gastrointestinal: Soft and nontender. No distention. No abdominal bruits. No CVA tenderness. Musculoskeletal: No lower extremity tenderness nor edema.  No joint effusions. Neurologic:  Normal speech and language. No gross focal neurologic deficits are appreciated. Speech is normal. No gait instability. Skin:  Skin is warm, dry and intact. No rash noted. There is tenderness on palpation of the infraorbital orbital area right side. There is moderate ecchymosis in various stages of resolve. Skin is intact. Psychiatric: Mood and affect are normal. Speech and behavior are normal.  ____________________________________________   LABS (all labs ordered are listed, but only abnormal results are displayed)  Labs Reviewed - No data to display   RADIOLOGY  CT maxillofacial area shows no acute bony injury. ____________________________________________   PROCEDURES  Procedure(s) performed: None  Critical Care performed: No  ____________________________________________   INITIAL IMPRESSION / ASSESSMENT AND PLAN / ED COURSE  Pertinent labs & imaging results that were available during my care of the patient were reviewed by me and considered in my medical decision making (see chart for details).  Patient is take Tylenol as needed for pain, ice packs to the face as needed for swelling, and follow up with her regular physician if any continued  problems. ____________________________________________   FINAL CLINICAL IMPRESSION(S) / ED DIAGNOSES  Final diagnoses:  Facial contusion, initial encounter      Tommi Rumps, PA-C 10/25/14 1136  Jene Every, MD 10/25/14 [phone]

## 2014-10-25 NOTE — Discharge Instructions (Signed)
TYLENOL IF NEEDED FOR PAIN ICE TO FACE FOR SWELLING WHEN NEEDED FOLLOW UP WITH YOUR REGULAR DOCTOR IF ANY CONTINUED PROBLEMS

## 2014-10-25 NOTE — ED Notes (Signed)
Patient has bruising around her right eye.  Patient states she fell out of bed on Friday and hit herself on a nightstand.  Patient was seen in the ED at that time.  Patient is continuing to have pain.

## 2014-11-26 ENCOUNTER — Other Ambulatory Visit: Payer: Self-pay | Admitting: Internal Medicine

## 2014-11-26 DIAGNOSIS — M545 Low back pain: Secondary | ICD-10-CM

## 2014-11-26 DIAGNOSIS — M79604 Pain in right leg: Secondary | ICD-10-CM

## 2014-12-04 ENCOUNTER — Ambulatory Visit
Admission: RE | Admit: 2014-12-04 | Discharge: 2014-12-04 | Disposition: A | Discharge status: Home / Self Care | Payer: Medicare Other | Source: Ambulatory Visit | Attending: Internal Medicine | Admitting: Internal Medicine

## 2014-12-04 DIAGNOSIS — M545 Low back pain, unspecified: Secondary | ICD-10-CM

## 2014-12-04 DIAGNOSIS — M4806 Spinal stenosis, lumbar region: Secondary | ICD-10-CM | POA: Insufficient documentation

## 2014-12-04 DIAGNOSIS — M5126 Other intervertebral disc displacement, lumbar region: Secondary | ICD-10-CM | POA: Diagnosis not present

## 2014-12-04 DIAGNOSIS — M79604 Pain in right leg: Secondary | ICD-10-CM | POA: Diagnosis present

## 2014-12-23 ENCOUNTER — Encounter: Payer: Self-pay | Admitting: Emergency Medicine

## 2014-12-23 ENCOUNTER — Emergency Department
Admission: EM | Admit: 2014-12-23 | Discharge: 2014-12-23 | Disposition: A | Discharge status: Home / Self Care | Payer: Medicare Other | Attending: Emergency Medicine | Admitting: Emergency Medicine

## 2014-12-23 DIAGNOSIS — M545 Low back pain, unspecified: Secondary | ICD-10-CM

## 2014-12-23 DIAGNOSIS — Z880 Allergy status to penicillin: Secondary | ICD-10-CM | POA: Diagnosis not present

## 2014-12-23 DIAGNOSIS — R531 Weakness: Secondary | ICD-10-CM | POA: Insufficient documentation

## 2014-12-23 DIAGNOSIS — Z791 Long term (current) use of non-steroidal anti-inflammatories (NSAID): Secondary | ICD-10-CM | POA: Insufficient documentation

## 2014-12-23 DIAGNOSIS — R109 Unspecified abdominal pain: Secondary | ICD-10-CM | POA: Diagnosis present

## 2014-12-23 DIAGNOSIS — Z79899 Other long term (current) drug therapy: Secondary | ICD-10-CM | POA: Insufficient documentation

## 2014-12-23 DIAGNOSIS — Z7951 Long term (current) use of inhaled steroids: Secondary | ICD-10-CM | POA: Insufficient documentation

## 2014-12-23 LAB — URINALYSIS COMPLETE WITH MICROSCOPIC (ARMC ONLY)
Bilirubin Urine: NEGATIVE
Glucose, UA: NEGATIVE mg/dL
HGB URINE DIPSTICK: NEGATIVE
Ketones, ur: NEGATIVE mg/dL
LEUKOCYTES UA: NEGATIVE
NITRITE: NEGATIVE
PH: 6 (ref 5.0–8.0)
Protein, ur: NEGATIVE mg/dL
SPECIFIC GRAVITY, URINE: 1.018 (ref 1.005–1.030)

## 2014-12-23 LAB — CBC WITH DIFFERENTIAL/PLATELET
BASOS PCT: 1 %
Basophils Absolute: 0 10*3/uL (ref 0–0.1)
EOS ABS: 0.2 10*3/uL (ref 0–0.7)
EOS PCT: 3 %
HCT: 36.8 % (ref 35.0–47.0)
HEMOGLOBIN: 12.8 g/dL (ref 12.0–16.0)
LYMPHS ABS: 1.8 10*3/uL (ref 1.0–3.6)
Lymphocytes Relative: 22 %
MCH: 32.5 pg (ref 26.0–34.0)
MCHC: 34.6 g/dL (ref 32.0–36.0)
MCV: 93.9 fL (ref 80.0–100.0)
Monocytes Absolute: 0.8 10*3/uL (ref 0.2–0.9)
Monocytes Relative: 10 %
NEUTROS PCT: 64 %
Neutro Abs: 5.1 10*3/uL (ref 1.4–6.5)
PLATELETS: 262 10*3/uL (ref 150–440)
RBC: 3.92 MIL/uL (ref 3.80–5.20)
RDW: 13.2 % (ref 11.5–14.5)
WBC: 7.9 10*3/uL (ref 3.6–11.0)

## 2014-12-23 LAB — BASIC METABOLIC PANEL
Anion gap: 6 (ref 5–15)
BUN: 15 mg/dL (ref 6–20)
CHLORIDE: 107 mmol/L (ref 101–111)
CO2: 27 mmol/L (ref 22–32)
CREATININE: 0.65 mg/dL (ref 0.44–1.00)
Calcium: 9.2 mg/dL (ref 8.9–10.3)
Glucose, Bld: 88 mg/dL (ref 65–99)
POTASSIUM: 3.8 mmol/L (ref 3.5–5.1)
SODIUM: 140 mmol/L (ref 135–145)

## 2014-12-23 NOTE — ED Notes (Signed)
Clinton Sawyer Called to alert of patient's discharge.  He will be here to pick patient up in about 1 hour.

## 2014-12-23 NOTE — Discharge Instructions (Signed)
Your back pain appears to be a chronic problem. Please continue usual medicines or see your regular doctor about this back pain. Your blood tests were normal, your urine test showed no infection. Return the emergency department if you have worsening pain or other urgent concerns.  Back Pain, Adult Back pain is very common. The pain often gets better over time. The cause of back pain is usually not dangerous. Most people can learn to manage their back pain on their own.  HOME CARE   Stay active. Start with short walks on flat ground if you can. Try to walk farther each day.  Do not sit, drive, or stand in one place for more than 30 minutes. Do not stay in bed.  Do not avoid exercise or work. Activity can help your back heal faster.  Be careful when you bend or lift an object. Bend at your knees, keep the object close to you, and do not twist.  Sleep on a firm mattress. Lie on your side, and bend your knees. If you lie on your back, put a pillow under your knees.  Only take medicines as told by your doctor.  Put ice on the injured area.  Put ice in a plastic bag.  Place a towel between your skin and the bag.  Leave the ice on for 15-20 minutes, 03-04 times a day for the first 2 to 3 days. After that, you can switch between ice and heat packs.  Ask your doctor about back exercises or massage.  Avoid feeling anxious or stressed. Find good ways to deal with stress, such as exercise. GET HELP RIGHT AWAY IF:   Your pain does not go away with rest or medicine.  Your pain does not go away in 1 week.  You have new problems.  You do not feel well.  The pain spreads into your legs.  You cannot control when you poop (bowel movement) or pee (urinate).  Your arms or legs feel weak or lose feeling (numbness).  You feel sick to your stomach (nauseous) or throw up (vomit).  You have belly (abdominal) pain.  You feel like you may pass out (faint). MAKE SURE YOU:   Understand these  instructions.  Will watch your condition.  Will get help right away if you are not doing well or get worse. Document Released: 09/20/2007 Document Revised: 06/26/2011 Document Reviewed: 08/05/2013 [facility] Patient Information 2015 [place], [place]. This information is not intended to replace advice given to you by your health care provider. Make sure you discuss any questions you have with your health care provider.

## 2014-12-23 NOTE — ED Notes (Signed)
C/O low abdominal pain and intermittent burning with urination for 2 days.  EMS called to Occupational Therapy today because patient c/o not feeling well and feeling lightheaded.  Patient denies complaint of dizziness but states low back and low abdomen is hurting and intermittent burning with urination.  Patient reports a several month history of low back pain and had a MRI done approximately 3 weeks ago that showed a "pinched nerve and muscle spasm".

## 2014-12-23 NOTE — ED Notes (Signed)
Lunch given to patient.

## 2014-12-23 NOTE — ED Provider Notes (Signed)
[facility] Emergency Department Provider Note  ____________________________________________  Time seen: 12:15  I have reviewed the triage vital signs and the nursing notes.  History is from the patient, but this is a little bit limited due to her cognitive limitations.  HISTORY  Chief Complaint Back pain (patient had also complained about abdominal pain to the nursing staff but did not mention it to me.)    HPI [name] is a 49 y.o. female with cognitive limitations who lives in a care home. Today she was at her day program. She apparently had some discomfort or difficulties there and was subsequently brought to the emergency department.  Upon my interview, the patient tells me the reason she is here is because of back pain. She reports she has had back pain for 3-4 months and has seen other doctors about this andhad an MRI previously. She reports her lower back is bothering her, as she shifts back-and-forth and moves her mount the bed without any acute distress. It's unclear if she is shifting back and forth due to discomfort.  She denies any new or acute pain in the back. When asked further about what I might peel to help her with, she reports that while at her day program she felt lightheaded. She denies loss of consciousness by report she had this sudden feeling of weakness and dizziness. That has resolved now.  Upon further discussion she does begin to speak about pain that comes from her back and wraps around her belly. She denies any nausea, vomiting, or diarrhea.    Past Medical History  Diagnosis Date  . Asthma     Patient Active Problem List   Diagnosis Date Noted  . 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 GERD (gastroesophageal reflux disease) 09/24/2014  . COPD (chronic obstructive pulmonary disease) 09/24/2014  . Borderline personality disorder 09/24/2014  . Major depressive disorder, recurrent episode, moderate 09/24/2014  .  Cerebral palsy 08/31/2014    Past Surgical History  Procedure Laterality Date  . Cholecystectomy    . Tonsillectomy      Current Outpatient Rx  Name  Route  Sig  Dispense  Refill  . albuterol (PROVENTIL HFA;VENTOLIN HFA) 108 (90 BASE) MCG/ACT inhaler   Inhalation   Inhale 2 puffs into the lungs every 4 (four) hours as needed for wheezing or shortness of breath.         . ARIPiprazole (ABILIFY) 10 MG tablet   Oral   Take 1 tablet (10 mg total) by mouth daily.   30 tablet   0   . clonazePAM (KLONOPIN) 0.5 MG tablet   Oral   Take 0.5 mg by mouth 2 (two) times daily.         Marland Kitchen escitalopram (LEXAPRO) 20 MG tablet   Oral   Take 1 tablet (20 mg total) by mouth at bedtime.   30 tablet   0   . fenofibrate 54 MG tablet   Oral   Take 54 mg by mouth daily.         . fluticasone (FLONASE) 50 MCG/ACT nasal spray   Each Nare   Place 1-2 sprays into both nostrils daily as needed for allergies or rhinitis.          . Fluticasone-Salmeterol (ADVAIR) 100-50 MCG/DOSE AEPB   Inhalation   Inhale 1 puff into the lungs 2 (two) times daily.         Marland Kitchen ibuprofen (ADVIL,MOTRIN) 600 MG tablet   Oral   Take 1  tablet (600 mg total) by mouth 3 (three) times daily.   12 tablet   0   . loratadine (CLARITIN) 10 MG tablet   Oral   Take 1 tablet (10 mg total) by mouth daily.   30 tablet   0   . meloxicam (MOBIC) 15 MG tablet   Oral   Take 15 mg by mouth daily.         . pantoprazole (PROTONIX) 40 MG tablet   Oral   Take 40 mg by mouth daily.         . traMADol (ULTRAM) 50 MG tablet   Oral   Take 1 tablet (50 mg total) by mouth every 8 (eight) hours as needed.   12 tablet   0     Allergies Penicillins  No family history on file.  Social History Social History  Substance Use Topics  . Smoking status: Never Smoker   . Smokeless tobacco: None  . Alcohol Use: No    Review of Systems  Constitutional: Negative for fever. ENT: Negative for sore  throat. Cardiovascular: Episode of weakness, possibly near-syncope. See history of present illness. Respiratory: Negative for shortness of breath. Gastrointestinal: Abdominal pain wrapping around the back. No vomiting or diarrhea.. Genitourinary: Negative for dysuria. Musculoskeletal: Back pain. See history of present illness. Skin: Negative for rash. Neurological: Negative for headaches   10-point ROS otherwise negative.  ____________________________________________   PHYSICAL EXAM:  VITAL SIGNS: ED Triage Vitals  Enc Vitals Group     BP 12/23/14 1143 105/76 mmHg     Pulse Rate 12/23/14 1143 73     Resp 12/23/14 1143 16     Temp 12/23/14 1159 97.8 F (36.6 C)     Temp Source 12/23/14 1159 Oral     SpO2 12/23/14 1143 97 %     Weight 12/23/14 1143 200 lb (90.719 kg)     Height 12/23/14 1143  (1.626 m)     Head Cir --      Peak Flow --      Pain Score 12/23/14 1143 5     Pain Loc --      Pain Edu? --      Excl. in GC? --     Constitutional: Alert, communicative. Patient shifts back and forth in bed without any distress. ENT   Head: Normocephalic and atraumatic.   Nose: No congestion/rhinnorhea.   Mouth/Throat: Mucous membranes are moist. Cardiovascular: Normal rate, regular rhythm, no murmur noted Respiratory:  Normal respiratory effort, no tachypnea.    Breath sounds are clear and equal bilaterally.  Gastrointestinal: Soft and nontender. No distention.  Back: No muscle spasm, but some discomfort on palpation of her lumbar paraspinals. No CVA tenderness. Musculoskeletal: No deformity noted. Nontender with normal range of motion in all extremities.  No noted edema. Negative straight leg raise. Good sensation and motor function in the legs. Neurologic:  Normal speech and language. No gross focal neurologic deficits are appreciated.  Skin:  Skin is warm, dry. No rash noted. Psychiatric: Alert, communicative, but with some apparent limitation in cognition.  She is overall, and cooperative.  ____________________________________________    LABS (pertinent positives/negatives)  Labs Reviewed  URINALYSIS COMPLETEWITH MICROSCOPIC ([facility] ONLY) - Abnormal; Notable for the following:    Color, Urine YELLOW (*)    APPearance HAZY (*)    Bacteria, UA RARE (*)    Squamous Epithelial / LPF 6-30 (*)    All other components within normal limits  CBC WITH DIFFERENTIAL/PLATELET  BASIC METABOLIC PANEL     ____________________________________________   EKG  ED ECG REPORT I, Jakita Dutkiewicz W, the attending physician, personally viewed and interpreted this ECG.   Date: 12/23/2014  EKG Time: 1254  Rate: 66  Rhythm: Normal sinus rhythm  Axis: Normal  Intervals: Normal  ST&T Change: None noted   ____________________________________________   INITIAL IMPRESSION / ASSESSMENT AND PLAN / ED COURSE  Pertinent labs & imaging results that were available during my care of the patient were reviewed by me and considered in my medical decision making (see chart for details).  The patient looks well and in no acute distress. We will check a CBC for her blood count and electrolytes along with an EKG due to her episode of lightheadedness which appears to be the acute issue she is here for. I do not find anything acute regarding her back pain.   ----------------------------------------- 1:12 PM on 12/23/2014 -----------------------------------------  Lab results and been reviewed and are within normal limits. No sign of urinary tract infection. EKG within normal limits.  The patient overall looks well. We will discharge her and asked that she follow-up with her regular doctors for the back issues that she has had. If she has any further urgent concerns or if her care home has any urgent concerns, she may return to the emergency department.    ____________________________________________   FINAL CLINICAL IMPRESSION(S) / ED DIAGNOSES  Final  diagnoses:  Bilateral low back pain without sciatica  Weakness generalized      Darien Ramus, MD 12/23/14 1407

## 2014-12-23 NOTE — ED Notes (Signed)
Clinton Sawyer, head of patient's group home called.  Mr. Cliffton Asters states that patient has a history of "faking illness" in order to get out of participating in Day Program.  Patient has long history of this and utilizing Emergency Services.  Today patient became ill at her day program.  Mr. Lucilla Lame contact number is [phone] and will be available to pick patient up once patient is discharged from ED.

## 2014-12-23 NOTE — ED Notes (Signed)
Patient ambulated to BR with easy steady gait.  NAD

## 2014-12-23 NOTE — ED Notes (Signed)
AAOx3.  Skin warm and dry.  Ambulates with easy and steady gait. NAD 

## 2014-12-31 ENCOUNTER — Inpatient Hospital Stay
Admission: EM | Admit: 2014-12-31 | Discharge: 2015-01-06 | DRG: 570 | Disposition: A | Discharge status: Home / Self Care | Payer: Medicare Other | Attending: Internal Medicine | Admitting: Internal Medicine

## 2014-12-31 ENCOUNTER — Encounter: Payer: Self-pay | Admitting: Emergency Medicine

## 2014-12-31 ENCOUNTER — Emergency Department: Payer: Medicare Other

## 2014-12-31 DIAGNOSIS — L02419 Cutaneous abscess of limb, unspecified: Secondary | ICD-10-CM | POA: Diagnosis not present

## 2014-12-31 DIAGNOSIS — J449 Chronic obstructive pulmonary disease, unspecified: Secondary | ICD-10-CM | POA: Diagnosis present

## 2014-12-31 DIAGNOSIS — A419 Sepsis, unspecified organism: Secondary | ICD-10-CM | POA: Diagnosis present

## 2014-12-31 DIAGNOSIS — F329 Major depressive disorder, single episode, unspecified: Secondary | ICD-10-CM | POA: Diagnosis present

## 2014-12-31 DIAGNOSIS — O9952 Diseases of the respiratory system complicating childbirth: Secondary | ICD-10-CM | POA: Diagnosis present

## 2014-12-31 DIAGNOSIS — B9789 Other viral agents as the cause of diseases classified elsewhere: Secondary | ICD-10-CM | POA: Diagnosis present

## 2014-12-31 DIAGNOSIS — L03115 Cellulitis of right lower limb: Secondary | ICD-10-CM | POA: Diagnosis present

## 2014-12-31 DIAGNOSIS — G3184 Mild cognitive impairment, so stated: Secondary | ICD-10-CM | POA: Diagnosis present

## 2014-12-31 DIAGNOSIS — F603 Borderline personality disorder: Secondary | ICD-10-CM | POA: Diagnosis present

## 2014-12-31 DIAGNOSIS — F419 Anxiety disorder, unspecified: Secondary | ICD-10-CM | POA: Diagnosis present

## 2014-12-31 DIAGNOSIS — F331 Major depressive disorder, recurrent, moderate: Secondary | ICD-10-CM | POA: Diagnosis present

## 2014-12-31 DIAGNOSIS — J45909 Unspecified asthma, uncomplicated: Secondary | ICD-10-CM | POA: Diagnosis present

## 2014-12-31 DIAGNOSIS — K567 Ileus, unspecified: Secondary | ICD-10-CM

## 2014-12-31 DIAGNOSIS — M7989 Other specified soft tissue disorders: Secondary | ICD-10-CM | POA: Diagnosis present

## 2014-12-31 DIAGNOSIS — G809 Cerebral palsy, unspecified: Secondary | ICD-10-CM | POA: Diagnosis present

## 2014-12-31 DIAGNOSIS — L02415 Cutaneous abscess of right lower limb: Secondary | ICD-10-CM | POA: Diagnosis present

## 2014-12-31 DIAGNOSIS — L03119 Cellulitis of unspecified part of limb: Secondary | ICD-10-CM | POA: Diagnosis not present

## 2014-12-31 DIAGNOSIS — K219 Gastro-esophageal reflux disease without esophagitis: Secondary | ICD-10-CM | POA: Diagnosis present

## 2014-12-31 HISTORY — DX: Major depressive disorder, single episode, unspecified: F32.9

## 2014-12-31 HISTORY — DX: Cerebral palsy, unspecified: G80.9

## 2014-12-31 HISTORY — DX: Depression, unspecified: F32.A

## 2014-12-31 HISTORY — DX: Mild cognitive impairment of uncertain or unknown etiology: G31.84

## 2014-12-31 HISTORY — DX: Gastro-esophageal reflux disease without esophagitis: K21.9

## 2014-12-31 HISTORY — DX: Anxiety disorder, unspecified: F41.9

## 2014-12-31 HISTORY — DX: Borderline personality disorder: F60.3

## 2014-12-31 LAB — BASIC METABOLIC PANEL
ANION GAP: 9 (ref 5–15)
BUN: 19 mg/dL (ref 6–20)
CALCIUM: 9.5 mg/dL (ref 8.9–10.3)
CO2: 25 mmol/L (ref 22–32)
Chloride: 103 mmol/L (ref 101–111)
Creatinine, Ser: 0.83 mg/dL (ref 0.44–1.00)
GFR calc Af Amer: >60 mL/min (ref >60)
GLUCOSE: 113 mg/dL — AB (ref 65–99)
Potassium: 3.3 mmol/L — ABNORMAL LOW (ref 3.5–5.1)
Sodium: 137 mmol/L (ref 135–145)

## 2014-12-31 LAB — CBC
HCT: 37 % (ref 35.0–47.0)
Hemoglobin: 12.5 g/dL (ref 12.0–16.0)
MCH: 31.8 pg (ref 26.0–34.0)
MCHC: 33.9 g/dL (ref 32.0–36.0)
MCV: 93.6 fL (ref 80.0–100.0)
PLATELETS: 377 10*3/uL (ref 150–440)
RBC: 3.95 MIL/uL (ref 3.80–5.20)
RDW: 13 % (ref 11.5–14.5)
WBC: 19.3 10*3/uL — ABNORMAL HIGH (ref 3.6–11.0)

## 2014-12-31 LAB — SEDIMENTATION RATE: SED RATE: 66 mm/h — AB (ref 0–20)

## 2014-12-31 LAB — LACTIC ACID, PLASMA: LACTIC ACID, VENOUS: 0.7 mmol/L (ref 0.5–2.0)

## 2014-12-31 MED ORDER — HYDROMORPHONE HCL 1 MG/ML IJ SOLN
0.5000 mg | Freq: Once | INTRAMUSCULAR | Status: AC
  Administered 2014-12-31: 0.5 mg via INTRAVENOUS
  Filled 2014-12-31: qty 1

## 2014-12-31 MED ORDER — ONDANSETRON HCL 4 MG PO TABS: 4.0000 mg | ORAL_TABLET | Freq: Four times a day (QID) | ORAL | Status: DC | PRN

## 2014-12-31 MED ORDER — ACETAMINOPHEN 650 MG RE SUPP: 650.0000 mg | Freq: Four times a day (QID) | RECTAL | Status: DC | PRN

## 2014-12-31 MED ORDER — VANCOMYCIN HCL IN DEXTROSE 1-5 GM/200ML-% IV SOLN
1000.0000 mg | Freq: Once | INTRAVENOUS | Status: AC
  Administered 2015-01-01: 1000 mg via INTRAVENOUS
  Filled 2014-12-31: qty 200

## 2014-12-31 MED ORDER — SENNOSIDES-DOCUSATE SODIUM 8.6-50 MG PO TABS: 1.0000 | ORAL_TABLET | Freq: Every evening | ORAL | Status: DC | PRN

## 2014-12-31 MED ORDER — ACETAMINOPHEN 325 MG PO TABS
650.0000 mg | ORAL_TABLET | Freq: Four times a day (QID) | ORAL | Status: DC | PRN
  Administered 2014-12-31 – 2015-01-06 (×4): 650 mg via ORAL
  Filled 2014-12-31 (×4): qty 2

## 2014-12-31 MED ORDER — ONDANSETRON HCL 4 MG/2ML IJ SOLN
4.0000 mg | Freq: Once | INTRAMUSCULAR | Status: AC
  Administered 2014-12-31: 4 mg via INTRAVENOUS
  Filled 2014-12-31: qty 2

## 2014-12-31 MED ORDER — SODIUM CHLORIDE 0.9 % IV SOLN
INTRAVENOUS | Status: AC
  Administered 2014-12-31: 23:00:00 via INTRAVENOUS

## 2014-12-31 MED ORDER — ONDANSETRON HCL 4 MG/2ML IJ SOLN
4.0000 mg | Freq: Four times a day (QID) | INTRAMUSCULAR | Status: DC | PRN
  Administered 2015-01-01 – 2015-01-04 (×3): 4 mg via INTRAVENOUS
  Filled 2014-12-31 (×4): qty 2

## 2014-12-31 MED ORDER — HYDROMORPHONE HCL 1 MG/ML IJ SOLN
0.5000 mg | INTRAMUSCULAR | Status: DC | PRN
  Administered 2015-01-01 – 2015-01-03 (×7): 0.5 mg via INTRAVENOUS
  Filled 2014-12-31 (×7): qty 1

## 2014-12-31 MED ORDER — HYDROCODONE-ACETAMINOPHEN 5-325 MG PO TABS
1.0000 | ORAL_TABLET | ORAL | Status: DC | PRN
  Administered 2015-01-01 – 2015-01-05 (×8): 1 via ORAL
  Filled 2014-12-31 (×8): qty 1

## 2014-12-31 MED ORDER — CLINDAMYCIN PHOSPHATE 600 MG/50ML IV SOLN
600.0000 mg | Freq: Once | INTRAVENOUS | Status: AC
  Administered 2014-12-31: 600 mg via INTRAVENOUS
  Filled 2014-12-31: qty 50

## 2014-12-31 MED ORDER — HYDROMORPHONE HCL 1 MG/ML IJ SOLN
1.0000 mg | Freq: Once | INTRAMUSCULAR | Status: AC
  Administered 2014-12-31: 1 mg via INTRAVENOUS
  Filled 2014-12-31: qty 1

## 2014-12-31 MED ORDER — ONDANSETRON HCL 4 MG/2ML IJ SOLN
4.0000 mg | Freq: Once | INTRAMUSCULAR | Status: AC
Start: 2014-12-31 — End: 2014-12-31
  Administered 2014-12-31: 4 mg via INTRAVENOUS
  Filled 2014-12-31: qty 2

## 2014-12-31 MED ORDER — VANCOMYCIN HCL IN DEXTROSE 1-5 GM/200ML-% IV SOLN
1000.0000 mg | Freq: Two times a day (BID) | INTRAVENOUS | Status: DC
  Administered 2015-01-01: 1000 mg via INTRAVENOUS
  Filled 2014-12-31 (×3): qty 200

## 2014-12-31 MED ORDER — ENOXAPARIN SODIUM 40 MG/0.4ML ~~LOC~~ SOLN
40.0000 mg | SUBCUTANEOUS | Status: DC
  Administered 2014-12-31 – 2015-01-05 (×6): 40 mg via SUBCUTANEOUS
  Filled 2014-12-31 (×6): qty 0.4

## 2014-12-31 MED ORDER — ACETAMINOPHEN 500 MG PO TABS
1000.0000 mg | ORAL_TABLET | Freq: Once | ORAL | Status: AC
  Administered 2014-12-31: 1000 mg via ORAL
  Filled 2014-12-31: qty 2

## 2014-12-31 MED ORDER — PANTOPRAZOLE SODIUM 40 MG PO TBEC
40.0000 mg | DELAYED_RELEASE_TABLET | Freq: Every day | ORAL | Status: DC
  Administered 2015-01-01 – 2015-01-06 (×5): 40 mg via ORAL
  Filled 2014-12-31 (×5): qty 1

## 2014-12-31 NOTE — Progress Notes (Signed)
ANTIBIOTIC CONSULT NOTE - INITIAL  Pharmacy Consult for vancomycin Indication: cellulitis  Allergies  Allergen Reactions  . Penicillins Other (See Comments)    Pt states that she is "just scared to take it".      Patient Measurements: Height:  (162.6 cm) Weight: 217 lb (98.431 kg) IBW/kg (Calculated) : 54.7 Adjusted Body Weight: 72.2 kg  Vital Signs: Temp: 98.6 F (37 C) (09/15 2229) Temp Source: Oral (09/15 2229) BP: 108/72 mmHg (09/15 2229) Pulse Rate: 100 (09/15 2229) Intake/Output from previous day:   Intake/Output from this shift:    Labs:  Recent Labs  12/31/14 1620  WBC 19.3*  HGB 12.5  PLT 377  CREATININE 0.83   Estimated Creatinine Clearance: 94.5 mL/min (by C-G formula based on Cr of 0.83). No results for input(s): VANCOTROUGH, VANCOPEAK, VANCORANDOM, GENTTROUGH, GENTPEAK, GENTRANDOM, TOBRATROUGH, TOBRAPEAK, TOBRARND, AMIKACINPEAK, AMIKACINTROU, AMIKACIN in the last 72 hours.   Microbiology: No results found for this or any previous visit (from the past 720 hour(s)).  Medical History: Past Medical History  Diagnosis Date  . Asthma   . Anxiety   . GERD (gastroesophageal reflux disease)   . Mild cognitive impairment   . Cerebral palsy   . Borderline personality disorder   . Depression     Medications:  Infusions:  . sodium chloride     Assessment: 49 yof cc cellulitis RLE x 3 days. Erythema, painful, purulent, febrile max temperature 100.9. Starting IV abx.   Vd 50.5 L, Ke 0.082 hr-1, T1/2 8.3 hr, predicted trough 12.1 mcg/mL.   Goal of Therapy:  Vancomycin trough level 10-15 mcg/ml  Plan:  Expected duration 7 days with resolution of temperature and/or normalization of WBC. Vancomycin 1 gm IV Q12H with stacked dosing. Will order level before fourth dose.  Carola Frost, Pharm.D. Clinical Pharmacist 12/31/2014,10:42 PM

## 2014-12-31 NOTE — H&P (Signed)
[facility] [place] [facility]   PATIENT NAME: [name]    MR#:  [id number]  DATE OF BIRTH:  02/12/1966  DATE OF ADMISSION:  12/31/2014  PRIMARY CARE PHYSICIAN: Margaretann Loveless, MD   REQUESTING/REFERRING PHYSICIAN: Sharma Covert, MD  CHIEF COMPLAINT:   Chief Complaint  Patient presents with  . Cellulitis    HISTORY OF PRESENT ILLNESS:  [name]  is a 49 y.o. female who presents with right lower extremity cellulitis. Patient states that her leg began to develop some erythema to 3 days ago. This progressed until became exquisitely tender, and today an area over her anterior shin became more swollen and then ruptured draining purulent material. She was sent here for evaluation. She was found to be febrile here with a MAXIMUM TEMPERATURE of 100.9, and found to have an elevated white blood cell count at 19.3. She was given antibiotics in the ED, and hospitalists were called for admission for severe purulent cellulitis.  PAST MEDICAL HISTORY:   Past Medical History  Diagnosis Date  . Asthma   . Anxiety   . GERD (gastroesophageal reflux disease)   . Mild cognitive impairment   . Cerebral palsy   . Borderline personality disorder   . Depression     PAST SURGICAL HISTORY:   Past Surgical History  Procedure Laterality Date  . Cholecystectomy    . Tonsillectomy      SOCIAL HISTORY:   Social History  Substance Use Topics  . Smoking status: Never Smoker   . Smokeless tobacco: Not on file  . Alcohol Use: No    FAMILY HISTORY:   Family History  Problem Relation Age of Onset  . Family history unknown: Yes    DRUG ALLERGIES:   Allergies  Allergen Reactions  . Penicillins Other (See Comments)    Pt states that she is "just scared to take it".      MEDICATIONS AT HOME:   Prior to Admission medications   Medication Sig Start Date End Date Taking? Authorizing Provider  albuterol (PROVENTIL HFA;VENTOLIN HFA) 108 (90 BASE) MCG/ACT  inhaler Inhale 2 puffs into the lungs every 4 (four) hours as needed for wheezing or shortness of breath.    Historical Provider, MD  ARIPiprazole (ABILIFY) 10 MG tablet Take 1 tablet (10 mg total) by mouth daily. 09/26/14   Jimmy Footman, MD  clonazePAM (KLONOPIN) 0.5 MG tablet Take 0.5 mg by mouth 2 (two) times daily.    Historical Provider, MD  escitalopram (LEXAPRO) 20 MG tablet Take 1 tablet (20 mg total) by mouth at bedtime. 09/25/14   Jimmy Footman, MD  fenofibrate 54 MG tablet Take 54 mg by mouth daily.    Historical Provider, MD  fluticasone (FLONASE) 50 MCG/ACT nasal spray Place 1-2 sprays into both nostrils daily as needed for allergies or rhinitis.     Historical Provider, MD  Fluticasone-Salmeterol (ADVAIR) 100-50 MCG/DOSE AEPB Inhale 1 puff into the lungs 2 (two) times daily.    Historical Provider, MD  ibuprofen (ADVIL,MOTRIN) 600 MG tablet Take 1 tablet (600 mg total) by mouth 3 (three) times daily. 10/21/14   Darien Ramus, MD  loratadine (CLARITIN) 10 MG tablet Take 1 tablet (10 mg total) by mouth daily. 09/25/14   Jimmy Footman, MD  meloxicam (MOBIC) 15 MG tablet Take 15 mg by mouth daily.    Historical Provider, MD  pantoprazole (PROTONIX) 40 MG tablet Take 40 mg by mouth daily.    Historical Provider, MD  traMADol Janean Sark) 50  MG tablet Take 1 tablet (50 mg total) by mouth every 8 (eight) hours as needed. 10/21/14 10/21/15  Darien Ramus, MD    REVIEW OF SYSTEMS:  Review of Systems  Constitutional: Positive for fever and chills. Negative for weight loss and malaise/fatigue.  HENT: Negative for ear pain, hearing loss and tinnitus.   Eyes: Negative for blurred vision, double vision, pain and redness.  Respiratory: Negative for cough, hemoptysis and shortness of breath.   Cardiovascular: Negative for chest pain, palpitations, orthopnea and leg swelling.  Gastrointestinal: Positive for nausea. Negative for vomiting, abdominal pain, diarrhea and  constipation.  Genitourinary: Negative for dysuria, frequency and hematuria.  Musculoskeletal: Negative for back pain, joint pain and neck pain.  Skin:       Right leg erythema and drainage with tenderness.  No acne, rash  Neurological: Negative for dizziness, tremors, focal weakness and weakness.  Endo/Heme/Allergies: Negative for polydipsia. Does not bruise/bleed easily.  Psychiatric/Behavioral: Negative for depression. The patient is not nervous/anxious and does not have insomnia.      VITAL SIGNS:   Filed Vitals:   12/31/14 1545 12/31/14 1557 12/31/14 1913 12/31/14 2117  BP:  108/75 124/81 103/60  Pulse:  107 104 104  Temp:  100.9 F (38.3 C) 99.3 F (37.4 C)   TempSrc:  Oral Oral   Resp:  18 22 16   Height: 5\' 4"  (1.626 m)     Weight: 98.431 kg (217 lb)     SpO2:  98% 97% 96%   Wt Readings from Last 3 Encounters:  12/31/14 98.431 kg (217 lb)  12/23/14 90.719 kg (200 lb)  10/25/14 99.791 kg (220 lb)    PHYSICAL EXAMINATION:  Physical Exam  Vitals reviewed. Constitutional: She is oriented to person, place, and time. She appears well-developed and well-nourished. No distress.  HENT:  Head: Normocephalic and atraumatic.  Mouth/Throat: Oropharynx is clear and moist.  Eyes: Conjunctivae and EOM are normal. Pupils are equal, round, and reactive to light. No scleral icterus.  Neck: Normal range of motion. Neck supple. No JVD present. No thyromegaly present.  Cardiovascular: Normal rate, regular rhythm and intact distal pulses.  Exam reveals no gallop and no friction rub.   Murmur (II/VI systolic) heard. Respiratory: Effort normal and breath sounds normal. No respiratory distress. She has no wheezes. She has no rales.  GI: Soft. Bowel sounds are normal. She exhibits no distension. There is no tenderness.  Musculoskeletal: Normal range of motion. She exhibits no edema.  No arthritis, no gout  Lymphadenopathy:    She has no cervical adenopathy.  Neurological: She is alert and  oriented to person, place, and time. No cranial nerve deficit.  No dysarthria, no aphasia  Skin: Skin is warm and dry. No rash noted. There is erythema (right leg).  Area of purulence draining from right leg just distal to knee over anterior shin  Psychiatric:  Baseline mental deficit, pt able to converse and give full history, alert and appropriate and cooperative.    LABORATORY PANEL:   CBC  Recent Labs Lab 12/31/14 1620  WBC 19.3*  HGB 12.5  HCT 37.0  PLT 377   ------------------------------------------------------------------------------------------------------------------  Chemistries   Recent Labs Lab 12/31/14 1620  NA 137  K 3.3*  CL 103  CO2 25  GLUCOSE 113*  BUN 19  CREATININE 0.83  CALCIUM 9.5   ------------------------------------------------------------------------------------------------------------------  Cardiac Enzymes No results for input(s): TROPONINI in the last 168 hours. ------------------------------------------------------------------------------------------------------------------  RADIOLOGY:  Dg Tibia/fibula Right  12/31/2014   CLINICAL  DATA:  Infection.  EXAM: RIGHT TIBIA AND FIBULA - 2 VIEW  COMPARISON:  None.  FINDINGS: No fracture. No dislocation no destructive bone lesion. No periosteum reaction.  IMPRESSION: No acute bony pathology.   Electronically Signed   By: Jolaine Click M.D.   On: 12/31/2014 20:14    EKG:   Orders placed or performed during the hospital encounter of 12/23/14  . ED EKG  . ED EKG  . EKG    IMPRESSION AND PLAN:  Principal Problem:   Cellulitis and abscess of leg - severe purulent cellulitis, likely abscess over her anterior shin which is artery ruptured and is draining. Will treat her here with IV vancomycin, send blood cultures, check a lactic acid to assess for potential sepsis (although the patient is not toxic appearing and is hemodynamically stable). Active Problems:   COPD (chronic obstructive pulmonary  disease) - patient is reportedly on inhalers at home, though her med rec was not able to be verified. She will need medications for this ordered once her med rec is completed.   GERD (gastroesophageal reflux disease) - PPI while here   Major depressive disorder, recurrent episode, moderate - on several medications reportedly, will need these ordered once med rec is completed   Anxiety - on medications, potentially on chronic use of benzoyl, will need disordered once med reconciliation was completed  All the records are reviewed and case discussed with ED provider. Management plans discussed with the patient and/or family.  DVT PROPHYLAXIS: SubQ lovenox  ADMISSION STATUS: Inpatient  CODE STATUS: Full, though this will need to be checked with her living facility once paperwork is able to be sent over in the morning along with her medication reconciliation.  TOTAL TIME TAKING CARE OF THIS PATIENT: 45 minutes.    Mykhia Danish FIELDING 12/31/2014, 9:36 PM  [facility]  Office  [phone]  CC: Primary care physician; Margaretann Loveless, MD

## 2014-12-31 NOTE — ED Provider Notes (Signed)
[facility] Emergency Department Provider Note  ____________________________________________  Time seen: Approximately 7:25 PM  I have reviewed the triage vital signs and the nursing notes.   HISTORY  Chief Complaint Cellulitis  History and exam are limited due to mild cognitive impairment.  HPI [name] is a 49 y.o. female who lives in a group home, with mild cognitive impairment, and a history of borderline personality disorder M DD and cerebral palsy presenting with right lower extremity swelling, pain,erythema, and skin break with purulent drainage. Patient reports that for the last 3 weeks she has had a defect in her skin over her right tibial shaft which has progressively worsened. She denies any fevers, chills, nausea or vomiting. She thought it might have started as a mosquito or spider bite.   Past Medical History  Diagnosis Date  . Asthma     Patient Active Problem List   Diagnosis Date Noted  . 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 GERD (gastroesophageal reflux disease) 09/24/2014  . COPD (chronic obstructive pulmonary disease) 09/24/2014  . Borderline personality disorder 09/24/2014  . Major depressive disorder, recurrent episode, moderate 09/24/2014  . Cerebral palsy 08/31/2014    Past Surgical History  Procedure Laterality Date  . Cholecystectomy    . Tonsillectomy      Current Outpatient Rx  Name  Route  Sig  Dispense  Refill  . albuterol (PROVENTIL HFA;VENTOLIN HFA) 108 (90 BASE) MCG/ACT inhaler   Inhalation   Inhale 2 puffs into the lungs every 4 (four) hours as needed for wheezing or shortness of breath.         . ARIPiprazole (ABILIFY) 10 MG tablet   Oral   Take 1 tablet (10 mg total) by mouth daily.   30 tablet   0   . clonazePAM (KLONOPIN) 0.5 MG tablet   Oral   Take 0.5 mg by mouth 2 (two) times daily.         Marland Kitchen escitalopram (LEXAPRO) 20 MG tablet   Oral   Take 1 tablet (20 mg total) by mouth at  bedtime.   30 tablet   0   . fenofibrate 54 MG tablet   Oral   Take 54 mg by mouth daily.         . fluticasone (FLONASE) 50 MCG/ACT nasal spray   Each Nare   Place 1-2 sprays into both nostrils daily as needed for allergies or rhinitis.          . Fluticasone-Salmeterol (ADVAIR) 100-50 MCG/DOSE AEPB   Inhalation   Inhale 1 puff into the lungs 2 (two) times daily.         Marland Kitchen ibuprofen (ADVIL,MOTRIN) 600 MG tablet   Oral   Take 1 tablet (600 mg total) by mouth 3 (three) times daily.   12 tablet   0   . loratadine (CLARITIN) 10 MG tablet   Oral   Take 1 tablet (10 mg total) by mouth daily.   30 tablet   0   . meloxicam (MOBIC) 15 MG tablet   Oral   Take 15 mg by mouth daily.         . pantoprazole (PROTONIX) 40 MG tablet   Oral   Take 40 mg by mouth daily.         . traMADol (ULTRAM) 50 MG tablet   Oral   Take 1 tablet (50 mg total) by mouth every 8 (eight) hours as needed.   12 tablet   0  Allergies Penicillins  No family history on file.  Social History Social History  Substance Use Topics  . Smoking status: Never Smoker   . Smokeless tobacco: None  . Alcohol Use: No    Review of Systems Constitutional: No fever/chills Eyes: No visual changes. ENT: No sore throat. Cardiovascular: Denies chest pain, palpitations. Respiratory: Denies shortness of breath.  No cough. Gastrointestinal: No abdominal pain.  No nausea, no vomiting.  No diarrhea.  No constipation. Genitourinary: Negative for dysuria. Musculoskeletal: Negative for back pain. Skin: Positive for wound and erythema. Neurological: Negative for headaches, focal weakness or numbness.  10-point ROS otherwise negative.  ____________________________________________   PHYSICAL EXAM:  VITAL SIGNS: ED Triage Vitals  Enc Vitals Group     BP 12/31/14 1557 108/75 mmHg     Pulse Rate 12/31/14 1557 107     Resp 12/31/14 1557 18     Temp 12/31/14 1557 100.9 F (38.3 C)     Temp  Source 12/31/14 1557 Oral     SpO2 12/31/14 1557 98 %     Weight 12/31/14 1545 217 lb (98.431 kg)     Height 12/31/14 1545 5\' 4"  (1.626 m)     Head Cir --      Peak Flow --      Pain Score 12/31/14 1545 10     Pain Loc --      Pain Edu? --      Excl. in GC? --     Constitutional: Alert and oriented with some difficult to understand speech.  Answers questions appropriately. Eyes: Conjunctivae are normal.  EOMI. Head: Atraumatic. Nose: No congestion/rhinnorhea. Mouth/Throat: Mucous membranes are moist.  Neck: No stridor.  Supple.   Cardiovascular: Normal rate, regular rhythm. No murmurs, rubs or gallops.  Respiratory: Normal respiratory effort.  No retractions. Lungs CTAB.  No wheezes, rales or ronchi. Gastrointestinal: Obese, Soft and nontender. No distention. No peritoneal signs. Musculoskeletal: Over the mid tibial shaft on the right, the patient has an area of raised blistering with purulent discharge that is actively oozing pus. She also has surrounding erythema to the knee and down through to the ankle with concomitant swelling. No lymphatic streaking streaking in the medial thigh. Normal DP and PT pulses as well as normal sensation to light touch in the right lower extremity. Neurologic:  Difficult to understand speech and limited cognition. No gross focal neurologic deficits are appreciated.  Skin:  See MSK exam Psychiatric: Mood and affect are normal.   ____________________________________________   LABS (all labs ordered are listed, but only abnormal results are displayed)  Labs Reviewed  BASIC METABOLIC PANEL - Abnormal; Notable for the following:    Potassium 3.3 (*)    Glucose, Bld 113 (*)    All other components within normal limits  CBC - Abnormal; Notable for the following:    WBC 19.3 (*)    All other components within normal limits  SEDIMENTATION RATE - Abnormal; Notable for the following:    Sed Rate 66 (*)    All other components within normal limits   CULTURE, BLOOD (ROUTINE X 2)  CULTURE, BLOOD (ROUTINE X 2)  C-REACTIVE PROTEIN   ____________________________________________  EKG  Not indicated ____________________________________________  RADIOLOGY  Dg Tibia/fibula Right  12/31/2014   CLINICAL DATA:  Infection.  EXAM: RIGHT TIBIA AND FIBULA - 2 VIEW  COMPARISON:  None.  FINDINGS: No fracture. No dislocation no destructive bone lesion. No periosteum reaction.  IMPRESSION: No acute bony pathology.   Electronically Signed  By: Jolaine Click M.D.   On: 12/31/2014 20:14    ____________________________________________   PROCEDURES  Procedure(s) performed: None  Critical Care performed: No ____________________________________________   INITIAL IMPRESSION / ASSESSMENT AND PLAN / ED COURSE  Pertinent labs & imaging results that were available during my care of the patient were reviewed by me and considered in my medical decision making (see chart for details).  49 year old female with limited cognition presenting with right lower extremity skin break and surrounding erythema and swelling. I am concerned that the patient has, at minimum, a cellulitis. Consider underlying abscess as well. Primary DVT is much less likely given the obvious source for possible infection. Will also rule out any free air with x-ray.  Unless there are any red flags on labs or x-ray, plan will be to initiate IV antibiotics and admit the patient to the hospitalist service if there appears to be any signs of free air, will involve orthopedics.  ____________________________________________  FINAL CLINICAL IMPRESSION(S) / ED DIAGNOSES  Final diagnoses:  None      NEW MEDICATIONS STARTED DURING THIS VISIT:  New Prescriptions   No medications on file     Rockne Menghini, MD 12/31/14 2056

## 2014-12-31 NOTE — ED Notes (Signed)
Pt states she thinks she was bit by an insect a few days ago on her right shin, states her leg is swollen and painful.

## 2014-12-31 NOTE — ED Notes (Signed)
Pt unable to lift her jeans for full view of leg, however, her ankle appears red and swollen. Pt states she feels nauseated. Temp 100.9 orally.

## 2015-01-01 DIAGNOSIS — L02419 Cutaneous abscess of limb, unspecified: Secondary | ICD-10-CM

## 2015-01-01 DIAGNOSIS — L03119 Cellulitis of unspecified part of limb: Secondary | ICD-10-CM

## 2015-01-01 LAB — BASIC METABOLIC PANEL
ANION GAP: 8 (ref 5–15)
BUN: 18 mg/dL (ref 6–20)
CHLORIDE: 104 mmol/L (ref 101–111)
CO2: 27 mmol/L (ref 22–32)
Calcium: 8.7 mg/dL — ABNORMAL LOW (ref 8.9–10.3)
Creatinine, Ser: 0.83 mg/dL (ref 0.44–1.00)
GFR calc Af Amer: >60 mL/min (ref >60)
GFR calc non Af Amer: >60 mL/min (ref >60)
GLUCOSE: 109 mg/dL — AB (ref 65–99)
POTASSIUM: 3.6 mmol/L (ref 3.5–5.1)
Sodium: 139 mmol/L (ref 135–145)

## 2015-01-01 LAB — CBC
HEMATOCRIT: 34.3 % — AB (ref 35.0–47.0)
HEMOGLOBIN: 11.5 g/dL — AB (ref 12.0–16.0)
MCH: 31.6 pg (ref 26.0–34.0)
MCHC: 33.5 g/dL (ref 32.0–36.0)
MCV: 94.4 fL (ref 80.0–100.0)
Platelets: 297 10*3/uL (ref 150–440)
RBC: 3.63 MIL/uL — ABNORMAL LOW (ref 3.80–5.20)
RDW: 13 % (ref 11.5–14.5)
WBC: 14.4 10*3/uL — ABNORMAL HIGH (ref 3.6–11.0)

## 2015-01-01 LAB — C-REACTIVE PROTEIN: CRP: 13.6 mg/dL — AB (ref <1.0)

## 2015-01-01 MED ORDER — VANCOMYCIN HCL 10 G IV SOLR
1250.0000 mg | Freq: Two times a day (BID) | INTRAVENOUS | Status: DC
  Administered 2015-01-01 – 2015-01-02 (×3): 1250 mg via INTRAVENOUS
  Filled 2015-01-01 (×4): qty 1250

## 2015-01-01 MED ORDER — SODIUM CHLORIDE 0.9 % IV SOLN
INTRAVENOUS | Status: AC
  Administered 2015-01-01: 17:00:00 via INTRAVENOUS

## 2015-01-01 MED ORDER — VANCOMYCIN HCL 10 G IV SOLR
1500.0000 mg | Freq: Two times a day (BID) | INTRAVENOUS | Status: DC
  Filled 2015-01-01 (×2): qty 1500

## 2015-01-01 NOTE — Progress Notes (Addendum)
ANTIBIOTIC CONSULT NOTE - Follow Up  Pharmacy Consult for vancomycin Indication: cellulitis  Allergies  Allergen Reactions  . Penicillins Other (See Comments)    Pt states that she is "just scared to take it".      Patient Measurements: Height:  (162.6 cm) Weight: 217 lb (98.431 kg) IBW/kg (Calculated) : 54.7 Adjusted Body Weight: 72.2 kg  Vital Signs: Temp: 98.5 F (36.9 C) (09/16 1225) Temp Source: Oral (09/16 1225) BP: 112/74 mmHg (09/16 1225) Pulse Rate: 86 (09/16 1225) Intake/Output from previous day: 09/15 0701 - 09/16 0700 In: 0  Out: 100 [Urine:100] Intake/Output from this shift: Total I/O In: 60 [P.O.:60] Out: 300 [Urine:300]  Labs:  Recent Labs  12/31/14 1620 01/01/15 0455  WBC 19.3* 14.4*  HGB 12.5 11.5*  PLT 377 297  CREATININE 0.83 0.83   Estimated Creatinine Clearance: 94.5 mL/min (by C-G formula based on Cr of 0.83).   Microbiology: Recent Results (from the past 720 hour(s))  Blood culture (routine x 2)     Status: None (Preliminary result)   Collection Time: 12/31/14  7:12 PM  Result Value Ref Range Status   Specimen Description BLOOD RIGHT ARM  Final   Special Requests BOTTLES DRAWN AEROBIC AND ANAEROBIC 10CC  Final   Culture NO GROWTH < 12 HOURS  Final   Report Status PENDING  Incomplete    Medical History: Past Medical History  Diagnosis Date  . Asthma   . Anxiety   . GERD (gastroesophageal reflux disease)   . Mild cognitive impairment   . Cerebral palsy   . Borderline personality disorder   . Depression     Medications:    Assessment: 49 yof cc cellulitis RLE x 3 days. Erythema, painful, purulent, febrile max temperature 100.9. Started IV Vanc 12/31/2014  Vd 50.5 L, Ke 0.082 hr-1, T1/2 8.45 hr, estimated Cpk= 38, estimated Ctr= 15.4 mcg/mL.   Goal of Therapy:  Vancomycin trough level 15-20 mcg/ml  Plan:  Expected duration 7 days with resolution of temperature and/or normalization of WBC. Change vancomycin regimen  to 1250 mg IV Q12H. Change in regimen due to targeting higher trough 2/2 to suspected abscess rather than pure cellulitis.  Will order level before fourth dose on 9/17 at 1730.  Cy Blamer, Pharm.D. Clinical Pharmacist 01/01/2015,1:23 PM

## 2015-01-01 NOTE — Consult Note (Signed)
Patient ID: [name], female   DOB: 1965/05/26, 49 y.o.   MRN: [id number]  HPI [name] is a 49 y.o. female with an abscess on her right leg and excess expanding cellulitis  HPI she developed swelling and erythema right leg several days ago. She says ureteral injury was a mosquito bite which she scratched and developed a significant infection. She says the original injury was over 2 weeks ago. She is admitted for treatment of her cellulitis. Surgery was consulted.  Past Medical History  Diagnosis Date  . Asthma   . Anxiety   . GERD (gastroesophageal reflux disease)   . Mild cognitive impairment   . Cerebral palsy   . Borderline personality disorder   . Depression     Past Surgical History  Procedure Laterality Date  . Cholecystectomy    . Tonsillectomy      Family History  Problem Relation Age of Onset  . Family history unknown: Yes    Social History Social History  Substance Use Topics  . Smoking status: Never Smoker   . Smokeless tobacco: None  . Alcohol Use: No    Allergies  Allergen Reactions  . Penicillins Other (See Comments)    Pt states that she is "just scared to take it".      Current Facility-Administered Medications  Medication Dose Route Frequency Provider Last Rate Last Dose  . acetaminophen (TYLENOL) tablet 650 mg  650 mg Oral Q6H PRN Oralia Manis, MD   650 mg at 01/01/15 4098   Or  . acetaminophen (TYLENOL) suppository 650 mg  650 mg Rectal Q6H PRN Oralia Manis, MD      . enoxaparin (LOVENOX) injection 40 mg  40 mg Subcutaneous Q24H Oralia Manis, MD   40 mg at 12/31/14 2250  . HYDROcodone-acetaminophen (NORCO/VICODIN) 5-325 MG per tablet 1-2 tablet  1-2 tablet Oral Q4H PRN Oralia Manis, MD      . HYDROmorphone (DILAUDID) injection 0.5 mg  0.5 mg Intravenous Q4H PRN Oralia Manis, MD   0.5 mg at 01/01/15 0517  . ondansetron (ZOFRAN) tablet 4 mg  4 mg Oral Q6H PRN Oralia Manis, MD       Or  . ondansetron [facility]) injection 4 mg  4 mg  Intravenous Q6H PRN Oralia Manis, MD      . pantoprazole (PROTONIX) EC tablet 40 mg  40 mg Oral QAC breakfast Oralia Manis, MD   40 mg at 01/01/15 0829  . senna-docusate (Senokot-S) tablet 1 tablet  1 tablet Oral QHS PRN Oralia Manis, MD      . vancomycin (VANCOCIN) 1,500 mg in sodium chloride 0.9 % 500 mL IVPB  1,500 mg Intravenous Q12H Milagros Loll, MD         Review of Systems A 10 point review of systems was asked and was negative except for the following positive findings: None noted  Physical Exam Blood pressure 112/74, pulse 86, temperature 98.5 F (36.9 C), temperature source Oral, resp. rate 17, height  (1.626 m), weight 98.431 kg (217 lb), SpO2 98 %. CONSTITUTIONAL: She is alert and oriented. EYES: Pupils are equal, round, and reactive to light, Sclera are non-icteric. EARS, NOSE, MOUTH AND THROAT: The oropharynx is clear. The oral mucosa is pink and moist. Hearing is intact to voice. LYMPH NODES:  Lymph nodes in the neck are normal. RESPIRATORY:  Lungs are clear. There is normal respiratory effort, with equal breath sounds bilaterally, and without pathologic use of accessory muscles. CARDIOVASCULAR: Heart is regular without murmurs, gallops,  or rubs. GI: The abdomen is soft , nontender, and nondistended. There are no palpable masses. There is no hepatosplenomegaly. There are normal bowel sounds in all quadrants. GU: Rectal deferred.   MUSCULOSKELETAL: Normal muscle strength and tone. Moderate edema cellulitic change pretibial abscess.   SKIN: Turgor is good and there are no pathologic skin lesions or ulcers. NEUROLOGIC: Motor and sensation is grossly normal. Cranial nerves are grossly intact. PSYCH:  Oriented to person, place and time. Affect is normal.  Data Reviewed I have reviewed the available records. I have personally reviewed the patient's imaging, laboratory findings and medical records.    Assessment    She has a lower extremity abscess. There does appear to  be significant cellulitis.    Plan    We will plan incision and drainage procedure under anesthesia tomorrow morning. I discussed this plan with her in detail. She is in agreement.     Time spent with the patient was 20 minutes, with more than 50% of the time spent in face-to-face education, counseling and care coordination.     Tiney Rouge III 01/01/2015, 1:41 PM

## 2015-01-01 NOTE — Progress Notes (Signed)
[facility] - [place] at [facility]   PATIENT NAME: [name]    MR#:  [id number]  DATE OF BIRTH:  [birth year]  SUBJECTIVE:  CHIEF COMPLAINT:   Chief Complaint  Patient presents with  . Cellulitis   continues to have pain in the right leg. No discharge noticed today. MAXIMUM TEMPERATURE of 100.8.  REVIEW OF SYSTEMS:    Review of Systems  Constitutional: Positive for malaise/fatigue. Negative for fever and chills.  HENT: Negative for sore throat.   Eyes: Negative for blurred vision, double vision and pain.  Respiratory: Negative for cough, hemoptysis, shortness of breath and wheezing.   Cardiovascular: Negative for chest pain, palpitations, orthopnea and leg swelling.  Gastrointestinal: Negative for heartburn, nausea, vomiting, abdominal pain, diarrhea and constipation.  Genitourinary: Negative for dysuria and hematuria.  Musculoskeletal: Positive for myalgias and joint pain. Negative for back pain.  Skin: Positive for rash.  Neurological: Negative for sensory change, speech change, focal weakness and headaches.  Endo/Heme/Allergies: Does not bruise/bleed easily.  Psychiatric/Behavioral: Negative for depression. The patient is not nervous/anxious.       DRUG ALLERGIES:   Allergies  Allergen Reactions  . Penicillins Other (See Comments)    Pt states that she is "just scared to take it".      VITALS:  Blood pressure 129/98, pulse 80, temperature 97.7 F (36.5 C), temperature source Oral, resp. rate 14, height  (1.626 m), weight 98.431 kg (217 lb), SpO2 97 %.  PHYSICAL EXAMINATION:   Physical Exam  GENERAL:  49 y.o.-year-old patient lying in the bed with no acute distress.  EYES: Pupils equal, round, reactive to light and accommodation. No scleral icterus. Extraocular muscles intact.  HEENT: Head atraumatic, normocephalic. Oropharynx and nasopharynx clear.  NECK:  Supple, no jugular venous distention. No thyroid enlargement, no  tenderness.  LUNGS: Normal breath sounds bilaterally, no wheezing, rales, rhonchi. No use of accessory muscles of respiration.  CARDIOVASCULAR: S1, S2 normal. No murmurs, rubs, or gallops.  ABDOMEN: Soft, nontender, nondistended. Bowel sounds present. No organomegaly or mass.  EXTREMITIES: No cyanosis, clubbing or edema b/l.    NEUROLOGIC: Cranial nerves II through XII are intact. No focal Motor or sensory deficits b/l.   PSYCHIATRIC: The patient is alert and oriented x 3.  SKIN: Right leg anterior erythema extending from the knee to the ankle. Area of fluctuance and swelling. No discharge. Tender and warm.   LABORATORY PANEL:   CBC  Recent Labs Lab 01/01/15 0455  WBC 14.4*  HGB 11.5*  HCT 34.3*  PLT 297   ------------------------------------------------------------------------------------------------------------------  Chemistries   Recent Labs Lab 01/01/15 0455  NA 139  K 3.6  CL 104  CO2 27  GLUCOSE 109*  BUN 18  CREATININE 0.83  CALCIUM 8.7*   ------------------------------------------------------------------------------------------------------------------  Cardiac Enzymes No results for input(s): TROPONINI in the last 168 hours. ------------------------------------------------------------------------------------------------------------------  RADIOLOGY:  Dg Tibia/fibula Right  12/31/2014   CLINICAL DATA:  Infection.  EXAM: RIGHT TIBIA AND FIBULA - 2 VIEW  COMPARISON:  None.  FINDINGS: No fracture. No dislocation no destructive bone lesion. No periosteum reaction.  IMPRESSION: No acute bony pathology.   Electronically Signed   By: Jolaine Click M.D.   On: 12/31/2014 20:14     ASSESSMENT AND PLAN:   * Right lower extremity cellulitis with abscess. The abscess seems to have ruptured yesterday. Today I don't see any discharge. Some fluctuance noticed. Tender and warm. Febrile. Continue IV antibiotics. Consult surgery for I&D.  * Sepsis due  to a above - present  on admission  * COPD is stable. Continue inhalers and nebulizers when necessary  * Anxiety  * DVT prophylaxis with Lovenox   All the records are reviewed and case discussed with Care Management/Social Workerr. Management plans discussed with the patient, family and they are in agreement.  CODE STATUS: No code  DVT Prophylaxis: SCDs  TOTAL TIME TAKING CARE OF THIS PATIENT: 35 minutes.   POSSIBLE D/C IN 2-3 DAYS, DEPENDING ON CLINICAL CONDITION.   Milagros Loll R M.D on 01/01/2015 at 11:54 AM  Between 7am to 6pm - Pager - [phone]  After 6pm go to [email or web address] - password [email or web address] [facility]  [facility] [place] [facility]  Office  [phone]  CC: Primary care physician; Margaretann Loveless, MD

## 2015-01-02 ENCOUNTER — Encounter: Payer: Self-pay | Admitting: Anesthesiology

## 2015-01-02 ENCOUNTER — Inpatient Hospital Stay: Payer: Medicare Other | Admitting: Anesthesiology

## 2015-01-02 ENCOUNTER — Encounter
Admission: EM | Disposition: A | Discharge status: Home / Self Care | Payer: Self-pay | Source: Home / Self Care | Attending: Internal Medicine

## 2015-01-02 HISTORY — PX: INCISION AND DRAINAGE ABSCESS: SHX5864

## 2015-01-02 LAB — CBC WITH DIFFERENTIAL/PLATELET
BASOS PCT: 0 %
Basophils Absolute: 0 10*3/uL (ref 0–0.1)
Eosinophils Absolute: 0.2 10*3/uL (ref 0–0.7)
Eosinophils Relative: 2 %
HEMATOCRIT: 31.6 % — AB (ref 35.0–47.0)
HEMOGLOBIN: 10.8 g/dL — AB (ref 12.0–16.0)
LYMPHS ABS: 1.3 10*3/uL (ref 1.0–3.6)
LYMPHS PCT: 11 %
MCH: 32.6 pg (ref 26.0–34.0)
MCHC: 34.2 g/dL (ref 32.0–36.0)
MCV: 95.1 fL (ref 80.0–100.0)
MONO ABS: 1.1 10*3/uL — AB (ref 0.2–0.9)
MONOS PCT: 9 %
NEUTROS ABS: 9.3 10*3/uL — AB (ref 1.4–6.5)
NEUTROS PCT: 78 %
Platelets: 310 10*3/uL (ref 150–440)
RBC: 3.33 MIL/uL — ABNORMAL LOW (ref 3.80–5.20)
RDW: 13.3 % (ref 11.5–14.5)
WBC: 11.9 10*3/uL — ABNORMAL HIGH (ref 3.6–11.0)

## 2015-01-02 LAB — MRSA PCR SCREENING: MRSA BY PCR: NEGATIVE

## 2015-01-02 LAB — VANCOMYCIN, TROUGH: Vancomycin Tr: 11 ug/mL (ref 10–20)

## 2015-01-02 SURGERY — INCISION AND DRAINAGE, ABSCESS
Anesthesia: Monitor Anesthesia Care | Laterality: Right

## 2015-01-02 MED ORDER — SODIUM CHLORIDE 0.9 % IV SOLN
INTRAVENOUS | Status: DC
  Administered 2015-01-02 – 2015-01-04 (×5): via INTRAVENOUS

## 2015-01-02 MED ORDER — FENTANYL CITRATE (PF) 100 MCG/2ML IJ SOLN
25.0000 ug | INTRAMUSCULAR | Status: DC | PRN
  Administered 2015-01-02 (×4): 25 ug via INTRAVENOUS

## 2015-01-02 MED ORDER — VANCOMYCIN HCL 10 G IV SOLR
1500.0000 mg | Freq: Two times a day (BID) | INTRAVENOUS | Status: DC
  Administered 2015-01-03 – 2015-01-04 (×3): 1500 mg via INTRAVENOUS
  Filled 2015-01-02 (×5): qty 1500

## 2015-01-02 MED ORDER — ONDANSETRON HCL 4 MG/2ML IJ SOLN: 4.0000 mg | Freq: Once | INTRAMUSCULAR | Status: DC | PRN

## 2015-01-02 MED ORDER — FENTANYL CITRATE (PF) 100 MCG/2ML IJ SOLN
INTRAMUSCULAR | Status: DC | PRN
  Administered 2015-01-02: 100 ug via INTRAVENOUS

## 2015-01-02 MED ORDER — MIDAZOLAM HCL 2 MG/2ML IJ SOLN
INTRAMUSCULAR | Status: DC | PRN
  Administered 2015-01-02: 2 mg via INTRAVENOUS

## 2015-01-02 MED ORDER — PROPOFOL INFUSION 10 MG/ML OPTIME
INTRAVENOUS | Status: DC | PRN
  Administered 2015-01-02: 50 ug/kg/min via INTRAVENOUS

## 2015-01-02 SURGICAL SUPPLY — 35 items
BNDG ADH 5X4 AIR PERM ELC (GAUZE/BANDAGES/DRESSINGS) ×1
BNDG COHESIVE 4X5 WHT NS (GAUZE/BANDAGES/DRESSINGS) ×2 IMPLANT
BRUSH SCRUB 4% CHG (MISCELLANEOUS) ×3 IMPLANT
CANISTER SUCT 1200ML W/VALVE (MISCELLANEOUS) ×3 IMPLANT
CHLORAPREP W/TINT 26ML (MISCELLANEOUS) ×3 IMPLANT
DRAIN PENROSE 1/4X12 LTX (DRAIN) ×3 IMPLANT
DRAPE LAPAROTOMY 100X77 ABD (DRAPES) ×3 IMPLANT
DRAPE UTILITY 15X26 TOWEL STRL (DRAPES) ×6 IMPLANT
DRSG VAC ATS MED SENSATRAC (GAUZE/BANDAGES/DRESSINGS) ×3 IMPLANT
GAUZE FLUFF 18X24 1PLY STRL (GAUZE/BANDAGES/DRESSINGS) ×2 IMPLANT
GAUZE SPONGE 4X4 12PLY STRL (GAUZE/BANDAGES/DRESSINGS) ×3 IMPLANT
GLOVE BIO SURGEON STRL SZ7 (GLOVE) ×3 IMPLANT
GOWN STRL REUS W/ TWL LRG LVL3 (GOWN DISPOSABLE) ×1 IMPLANT
GOWN STRL REUS W/ TWL XL LVL3 (GOWN DISPOSABLE) ×1 IMPLANT
GOWN STRL REUS W/TWL LRG LVL3 (GOWN DISPOSABLE) ×3
GOWN STRL REUS W/TWL XL LVL3 (GOWN DISPOSABLE) ×3
KIT RM TURNOVER STRD PROC AR (KITS) ×3 IMPLANT
NDL SAFETY ECLIPSE 18X1.5 (NEEDLE) ×1 IMPLANT
NEEDLE HYPO 18GX1.5 SHARP (NEEDLE) ×3
NS IRRIG 500ML POUR BTL (IV SOLUTION) ×3 IMPLANT
PACK BASIN MINOR ARMC (MISCELLANEOUS) ×3 IMPLANT
PAD ABD DERMACEA PRESS 5X9 (GAUZE/BANDAGES/DRESSINGS) ×6 IMPLANT
PAD GROUND ADULT SPLIT (MISCELLANEOUS) ×3 IMPLANT
SOL PREP PVP 2OZ (MISCELLANEOUS) ×3
SOLUTION PREP PVP 2OZ (MISCELLANEOUS) ×1 IMPLANT
SPONGE LAP 18X18 5 PK (GAUZE/BANDAGES/DRESSINGS) ×3 IMPLANT
SUT ETHILON 4-0 (SUTURE) ×3
SUT ETHILON 4-0 FS2 18XMFL BLK (SUTURE) ×1
SUT VIC AB 3-0 SH 27 (SUTURE) ×6
SUT VIC AB 3-0 SH 27X BRD (SUTURE) ×2 IMPLANT
SUTURE ETHLN 4-0 FS2 18XMF BLK (SUTURE) ×1 IMPLANT
SWAB CULTURE AMIES ANAERIB BLU (MISCELLANEOUS) ×3 IMPLANT
SYR BULB EAR ULCER 3OZ GRN STR (SYRINGE) ×3 IMPLANT
SYRINGE 10CC LL (SYRINGE) ×3 IMPLANT
WND VAC CANISTER 500ML (MISCELLANEOUS) ×3 IMPLANT

## 2015-01-02 NOTE — Progress Notes (Signed)

## 2015-01-02 NOTE — Progress Notes (Signed)
Primary RN spoke to Dr. [name] Pina being NPO and currently has no order for IV fluids. Orders received for NS at 100 ml/hr. Primary RN to continue to monitor.

## 2015-01-02 NOTE — Progress Notes (Signed)
ANTIBIOTIC CONSULT NOTE - FOLLOW UP  Pharmacy Consult for vancomycin Indication: Cellulitis/abscess  Allergies  Allergen Reactions  . Penicillins Other (See Comments)    Pt states that she is "just scared to take it".      Patient Measurements: Height: 5\' 4"  (162.6 cm) Weight: 217 lb (98.431 kg) IBW/kg (Calculated) : 54.7   Vital Signs: Temp: 98.6 F (37 C) (09/17 1538) Temp Source: Oral (09/17 1538) BP: 115/70 mmHg (09/17 1538) Pulse Rate: 87 (09/17 1538) Intake/Output from previous day: 09/16 0701 - 09/17 0700 In: 586 [P.O.:60; I.V.:526] Out: 850 [Urine:850] Intake/Output from this shift:    Labs:  Recent Labs  12/31/14 1620 01/01/15 0455 01/02/15 0631  WBC 19.3* 14.4* 11.9*  HGB 12.5 11.5* 10.8*  PLT 377 297 310  CREATININE 0.83 0.83  --    Estimated Creatinine Clearance: 94.5 mL/min (by C-G formula based on Cr of 0.83).  Recent Labs  01/02/15 1723  VANCOTROUGH 11     Microbiology: Recent Results (from the past 720 hour(s))  Blood culture (routine x 2)     Status: None (Preliminary result)   Collection Time: 12/31/14  7:12 PM  Result Value Ref Range Status   Specimen Description BLOOD RIGHT ARM  Final   Special Requests BOTTLES DRAWN AEROBIC AND ANAEROBIC 10CC  Final   Culture NO GROWTH 2 DAYS  Final   Report Status PENDING  Incomplete  MRSA PCR Screening     Status: None   Collection Time: 01/02/15  4:48 AM  Result Value Ref Range Status   MRSA by PCR NEGATIVE NEGATIVE Final    Comment:        The GeneXpert MRSA Assay (FDA approved for NASAL specimens only), is one component of a comprehensive MRSA colonization surveillance program. It is not intended to diagnose MRSA infection nor to guide or monitor treatment for MRSA infections.     Anti-infectives    Start     Dose/Rate Route Frequency Ordered Stop   01/03/15 0400  vancomycin (VANCOCIN) 1,500 mg in sodium chloride 0.9 % 500 mL IVPB     1,500 mg 250 mL/hr over 120 Minutes  Intravenous Every 12 hours 01/02/15 1859     01/01/15 1800  vancomycin (VANCOCIN) 1,500 mg in sodium chloride 0.9 % 500 mL IVPB  Status:  Discontinued     1,500 mg 250 mL/hr over 120 Minutes Intravenous Every 12 hours 01/01/15 1322 01/01/15 1353   01/01/15 1800  vancomycin (VANCOCIN) 1,250 mg in sodium chloride 0.9 % 250 mL IVPB  Status:  Discontinued     1,250 mg 166.7 mL/hr over 90 Minutes Intravenous Every 12 hours 01/01/15 1353 01/02/15 1858   01/01/15 0800  vancomycin (VANCOCIN) IVPB 1000 mg/200 mL premix  Status:  Discontinued     1,000 mg 200 mL/hr over 60 Minutes Intravenous Every 12 hours 12/31/14 2241 01/01/15 1322   12/31/14 2300  vancomycin (VANCOCIN) IVPB 1000 mg/200 mL premix     1,000 mg 200 mL/hr over 60 Minutes Intravenous  Once 12/31/14 2237 01/01/15 0128   12/31/14 1945  clindamycin (CLEOCIN) IVPB 600 mg     600 mg 100 mL/hr over 30 Minutes Intravenous  Once 12/31/14 1932 12/31/14 2030      Assessment: Patient prescribed vancomycin for cellulitis/abscess. Pharmacy consulted to dose vancomycin. Trough came back subtherapeutic at 11 mcg/mL today on 1250 mg IV q12h regimen.  Goal of Therapy:  Vancomycin trough level 15-20 mcg/ml  Plan:  Increased dose to 1500 mg IV q12h and  ordered a trough at steady state on 9/21. Pharmacy will continue to monitor and adjust dose as needed.   Jodelle Red Swayne 01/02/2015,7:02 PM

## 2015-01-02 NOTE — Op Note (Signed)
12/31/2014 - 01/02/2015  12:59 PM  PATIENT:  [name]  49 y.o. female  PRE-OPERATIVE DIAGNOSIS:  abscess  POST-OPERATIVE DIAGNOSIS:  * No post-op diagnosis entered *  PROCEDURE:  Procedure(s): INCISION AND DRAINAGE ABSCESS (Right)  SURGEON:  Surgeon(s) and Role:    * Tiney Rouge III, MD - Primary   ASSISTANTS: none   ANESTHESIA:   MAC  EBL:  Total I/O In: -  Out: 220 [Urine:200; Blood:20]   DRAINS: none   LOCAL MEDICATIONS USED:  NONE   DISPOSITION OF SPECIMEN:  N/A   DICTATION: .Dragon Dictation acidotic duction appropriate intravenous sedation the patient's leg was prepped with Betadine and draped sterile towels. The area abscess was incised and large amount of foul-smelling purulent material removed. It was cultured. The superficial skin edges were debrided through the subcutaneous tissue sharply for an area of approximately 10 cm. It was an excisional debridement. All of the obvious necrotic material was removed. A dry dressing was placed and sterile dressing placed on top of the Kerlix. Patient returned recovery room having tolerated procedure well. Sponge instrument needle count were correct 2 in the operating room.  PLAN OF CARE: Admit to inpatient   PATIENT DISPOSITION:  PACU - guarded condition.   Tiney Rouge III, MD

## 2015-01-02 NOTE — Anesthesia Preprocedure Evaluation (Addendum)
Anesthesia Evaluation  Patient identified by MRN, date of birth, ID band Patient awake    Reviewed: Allergy & Precautions, NPO status , Patient's Chart, lab work & pertinent test results, reviewed documented beta blocker date and time   Airway Mallampati: II  TM Distance: >3 FB     Dental  (+) Chipped   Pulmonary asthma , COPD,           Cardiovascular      Neuro/Psych PSYCHIATRIC DISORDERS Anxiety Depression    GI/Hepatic GERD  ,  Endo/Other    Renal/GU      Musculoskeletal   Abdominal   Peds  Hematology   Anesthesia Other Findings   Reproductive/Obstetrics                             Anesthesia Physical Anesthesia Plan  ASA: III  Anesthesia Plan: MAC   Post-op Pain Management:    Induction: Intravenous  Airway Management Planned: Nasal Cannula  Additional Equipment:   Intra-op Plan:   Post-operative Plan:   Informed Consent: I have reviewed the patients History and Physical, chart, labs and discussed the procedure including the risks, benefits and alternatives for the proposed anesthesia with the patient or authorized representative who has indicated his/her understanding and acceptance.     Plan Discussed with: CRNA  Anesthesia Plan Comments:         Anesthesia Quick Evaluation

## 2015-01-02 NOTE — Progress Notes (Signed)
[facility] Physicians - [place] at [facility]   PATIENT NAME: [name]    MR#:  [id number]  DATE OF BIRTH:  Apr 20, 1965  SUBJECTIVE:  Patient is wondering when she is going to go for incision and drainage. She is hungry. She continues to endorse pain on that right leg.  REVIEW OF SYSTEMS:    Review of Systems  Constitutional: Negative for fever, chills and malaise/fatigue.  HENT: Negative for sore throat.   Eyes: Negative for blurred vision.  Respiratory: Negative for cough, hemoptysis, shortness of breath and wheezing.   Cardiovascular: Negative for chest pain, palpitations and leg swelling.  Gastrointestinal: Negative for nausea, vomiting, abdominal pain, diarrhea and blood in stool.  Genitourinary: Negative for dysuria.  Musculoskeletal: Negative for back pain.       Pain right leg with swelling  Neurological: Negative for dizziness, tremors and headaches.  Endo/Heme/Allergies: Does not bruise/bleed easily.    Tolerating Diet:NPO currently      DRUG ALLERGIES:   Allergies  Allergen Reactions  . Penicillins Other (See Comments)    Pt states that she is "just scared to take it".      VITALS:  Blood pressure 114/61, pulse 84, temperature 98.4 F (36.9 C), temperature source Oral, resp. rate 16, height  (1.626 m), weight 98.431 kg (217 lb), SpO2 99 %.  PHYSICAL EXAMINATION:   Physical Exam  Constitutional: She is oriented to person, place, and time and well-developed, well-nourished, and in no distress. No distress.  HENT:  Head: Normocephalic.  Eyes: No scleral icterus.  Neck: Normal range of motion. Neck supple. No JVD present. No tracheal deviation present.  Cardiovascular: Normal rate, regular rhythm and normal heart sounds.  Exam reveals no gallop and no friction rub.   No murmur heard. Pulmonary/Chest: Effort normal and breath sounds normal. No respiratory distress. She has no wheezes. She has no rales. She exhibits no tenderness.   Abdominal: Soft. Bowel sounds are normal. She exhibits no distension and no mass. There is no tenderness. There is no rebound and no guarding.  Musculoskeletal: Normal range of motion. She exhibits edema.  Neurological: She is alert and oriented to person, place, and time.  Skin: Rash noted. No erythema.  Swelling in right leg. It is wrapped with Ace bandage  Psychiatric: Affect and judgment normal.      LABORATORY PANEL:   CBC  Recent Labs Lab 01/02/15 0631  WBC 11.9*  HGB 10.8*  HCT 31.6*  PLT 310   ------------------------------------------------------------------------------------------------------------------  Chemistries   Recent Labs Lab 01/01/15 0455  NA 139  K 3.6  CL 104  CO2 27  GLUCOSE 109*  BUN 18  CREATININE 0.83  CALCIUM 8.7*   ------------------------------------------------------------------------------------------------------------------  Cardiac Enzymes No results for input(s): TROPONINI in the last 168 hours. ------------------------------------------------------------------------------------------------------------------  RADIOLOGY:  Dg Tibia/fibula Right  12/31/2014   CLINICAL DATA:  Infection.  EXAM: RIGHT TIBIA AND FIBULA - 2 VIEW  COMPARISON:  None.  FINDINGS: No fracture. No dislocation no destructive bone lesion. No periosteum reaction.  IMPRESSION: No acute bony pathology.   Electronically Signed   By: Jolaine Click M.D.   On: 12/31/2014 20:14     ASSESSMENT AND PLAN:   49 year old female with cerebral palsy and mild cognitive impairment who presents with right lower extremity cellulitis from group home.  1. Right lower extremity cellulitis with abscess: Patient is planned for I and D this morning by Dr. Michela Pitcher. Patient is currently on vancomycin which will be continued. Continue pain  management.  2. History COPD: This is stable.      Management plans discussed with the patient and she is in agreement.  CODE STATUS:  FULL  TOTAL TIME TAKING CARE OF THIS PATIENT: 30 minutes.     POSSIBLE D/C 2-3 days, DEPENDING ON CLINICAL CONDITION.   MODY, SITAL M.D on 01/02/2015 at 11:13 AM  Between 7am to 6pm - Pager - [phone] After 6pm go to [email or web address] - password [email or web address] [facility]  [place] [facility]  Office  [phone]  CC: Primary care physician; Margaretann Loveless, MD

## 2015-01-02 NOTE — Transfer of Care (Signed)
Immediate Anesthesia Transfer of Care Note  Patient: [name]  Procedure(s) Performed: Procedure(s): INCISION AND DRAINAGE ABSCESS (Right)  Patient Location: PACU  Anesthesia Type:MAC  Level of Consciousness: awake  Airway & Oxygen Therapy: Patient Spontanous Breathing  Post-op Assessment: Report given to RN  Post vital signs: Reviewed  Last Vitals:  Filed Vitals:   01/02/15 0600  BP: 114/61  Pulse: 84  Temp: 36.9 C  Resp: 16    Complications: No apparent anesthesia complications

## 2015-01-03 LAB — BASIC METABOLIC PANEL
Anion gap: 6 (ref 5–15)
BUN: 10 mg/dL (ref 6–20)
CALCIUM: 8.8 mg/dL — AB (ref 8.9–10.3)
CHLORIDE: 105 mmol/L (ref 101–111)
CO2: 29 mmol/L (ref 22–32)
CREATININE: 0.57 mg/dL (ref 0.44–1.00)
GFR calc non Af Amer: >60 mL/min (ref >60)
GLUCOSE: 107 mg/dL — AB (ref 65–99)
Potassium: 3.6 mmol/L (ref 3.5–5.1)
Sodium: 140 mmol/L (ref 135–145)

## 2015-01-03 LAB — CBC
HEMATOCRIT: 30.9 % — AB (ref 35.0–47.0)
HEMOGLOBIN: 10.4 g/dL — AB (ref 12.0–16.0)
MCH: 31.5 pg (ref 26.0–34.0)
MCHC: 33.7 g/dL (ref 32.0–36.0)
MCV: 93.5 fL (ref 80.0–100.0)
Platelets: 344 10*3/uL (ref 150–440)
RBC: 3.31 MIL/uL — ABNORMAL LOW (ref 3.80–5.20)
RDW: 13 % (ref 11.5–14.5)
WBC: 8.1 10*3/uL (ref 3.6–11.0)

## 2015-01-03 MED ORDER — MOMETASONE FURO-FORMOTEROL FUM 100-5 MCG/ACT IN AERO
2.0000 | INHALATION_SPRAY | Freq: Two times a day (BID) | RESPIRATORY_TRACT | Status: DC
  Administered 2015-01-03 – 2015-01-06 (×7): 2 via RESPIRATORY_TRACT
  Filled 2015-01-03: qty 8.8

## 2015-01-03 MED ORDER — ALBUTEROL SULFATE (2.5 MG/3ML) 0.083% IN NEBU: 3.0000 mL | INHALATION_SOLUTION | RESPIRATORY_TRACT | Status: DC | PRN

## 2015-01-03 NOTE — Progress Notes (Signed)
[facility] Physicians - [place] at [facility]   PATIENT NAME: [name]    MR#:  [id number]  DATE OF BIRTH:  [birth year]  SUBJECTIVE:  Patient is doing better this morning. Patient is postop day #1 after I&D. Patient does not want to go back to her group home she says there are approaches at the group home. REVIEW OF SYSTEMS:    Review of Systems  Constitutional: Negative for fever, chills and malaise/fatigue.  HENT: Negative for sore throat.   Eyes: Negative for blurred vision.  Respiratory: Negative for cough, hemoptysis, shortness of breath and wheezing.   Cardiovascular: Negative for chest pain, palpitations and leg swelling.  Gastrointestinal: Negative for nausea, vomiting, abdominal pain, diarrhea and blood in stool.  Genitourinary: Negative for dysuria.  Musculoskeletal: Negative for back pain.       Pain right leg with swelling  Neurological: Negative for dizziness, tremors and headaches.  Endo/Heme/Allergies: Does not bruise/bleed easily.    Tolerating Diet: Yes      DRUG ALLERGIES:   Allergies  Allergen Reactions  . Penicillins Other (See Comments)    Pt states that she is "just scared to take it".      VITALS:  Blood pressure 125/71, pulse 74, temperature 97.7 F (36.5 C), temperature source Oral, resp. rate 24, height  (1.626 m), weight 98.431 kg (217 lb), SpO2 96 %.  PHYSICAL EXAMINATION:   Physical Exam  Constitutional: She is oriented to person, place, and time and well-developed, well-nourished, and in no distress. No distress.  HENT:  Head: Normocephalic.  Eyes: No scleral icterus.  Neck: Normal range of motion. Neck supple. No JVD present. No tracheal deviation present.  Cardiovascular: Normal rate, regular rhythm and normal heart sounds.  Exam reveals no gallop and no friction rub.   No murmur heard. Pulmonary/Chest: Effort normal and breath sounds normal. No respiratory distress. She has no wheezes. She has no rales.  She exhibits no tenderness.  Abdominal: Soft. Bowel sounds are normal. She exhibits no distension and no mass. There is no tenderness. There is no rebound and no guarding.  Musculoskeletal: Normal range of motion. She exhibits edema.  Neurological: She is alert and oriented to person, place, and time.  Skin: Rash noted. No erythema.  She has ace bandage wrapped   Psychiatric: Affect and judgment normal.      LABORATORY PANEL:   CBC  Recent Labs Lab 01/03/15 0227  WBC 8.1  HGB 10.4*  HCT 30.9*  PLT 344   ------------------------------------------------------------------------------------------------------------------  Chemistries   Recent Labs Lab 01/03/15 0227  NA 140  K 3.6  CL 105  CO2 29  GLUCOSE 107*  BUN 10  CREATININE 0.57  CALCIUM 8.8*   ------------------------------------------------------------------------------------------------------------------  Cardiac Enzymes No results for input(s): TROPONINI in the last 168 hours. ------------------------------------------------------------------------------------------------------------------  RADIOLOGY:  No results found.   ASSESSMENT AND PLAN:   49 year old female with cerebral palsy and mild cognitive impairment who presents with right lower extremity cellulitis from group home.  1. Right lower extremity cellulitis with abscess: Patient is postop day #1 I&D. Plan is for dressing change and possible wound VAC in the a.m. Continue vancomycin. Follow up on wound culture.   2. History COPD: This is stable.      Management plans discussed with the patient and she is in agreement. Discussed with SUrgery CODE STATUS: FULL  TOTAL TIME TAKING CARE OF THIS PATIENT: 25 minutes.     POSSIBLE D/C 2-3 days, DEPENDING ON CLINICAL CONDITION.  MODY, SITAL M.D on 01/03/2015 at 10:42 AM  Between 7am to 6pm - Pager - [phone] After 6pm go to [email or web address] - password [email or web address] [facility]  [place]  Hospitalists  Office  [phone]  CC: Primary care physician; Margaretann Loveless, MD

## 2015-01-03 NOTE — Progress Notes (Signed)
[name] is a 49 y.o. female POD#1 from I&D of right leg.  Patient doing well, up on bedside commode this AM.  Complaining that she does not want to go to group home.  States some pain in the leg but not more than yesterday.    Filed Vitals:   01/03/15 0517  BP: 125/71  Pulse: 74  Temp: 97.7 F (36.5 C)  Resp: 24   I/O last 3 completed shifts: In: 2124 [P.O.:360; I.V.:1764] Out: 820 [Urine:800; Blood:20] Total I/O In: 770.3 [P.O.:240; I.V.:530.3] Out: 350 [Urine:350]  PE:  Gen: NAD, AAOx3 Extremities: 1+ swelling in right leg, dressing in place, no erythema   Labs:  CBC Latest Ref Rng 01/03/2015 01/02/2015 01/01/2015  WBC 3.6 - 11.0 K/uL 8.1 11.9(H) 14.4(H)  Hemoglobin 12.0 - 16.0 g/dL 10.4(L) 10.8(L) 11.5(L)  Hematocrit 35.0 - 47.0 % 30.9(L) 31.6(L) 34.3(L)  Platelets 150 - 440 K/uL 344 310 297   BMP Latest Ref Rng 01/03/2015 01/01/2015 12/31/2014  Glucose 65 - 99 mg/dL 161(W) 960(A) 540(J)  BUN 6 - 20 mg/dL Creatinine 0.44 - 1.00 mg/dL 8.11 9.14 7.82  Sodium 135 - 145 mmol/L 140 139 137  Potassium 3.5 - 5.1 mmol/L 3.6 3.6 3.3(L)  Chloride 101 - 111 mmol/L 105 104 103  CO2 22 - 32 mmol/L Calcium 8.9 - 10.3 mg/dL 9.5(A) 2.1(H) 9.5    Assessment/Plan: 49 yr old POD#1 I&D Right leg Will remove dressing tomorrow and evaluate if a wound vac needed, continue vanc, f/u culture

## 2015-01-03 NOTE — Clinical Social Work Note (Signed)
Clinical Social Work Assessment  Patient Details  Name: [name] MRN: [id number] Date of Birth: Nov 16, 1965  Date of referral:  01/03/15               Reason for consult:  Facility Placement                Permission sought to share information with:  Case Manager, Magazine features editor, Family Supports Permission granted to share information::  Yes, Verbal Permission Granted  Name::     [name] [phone]  Agency::  [facility]  Relationship::  Brother [name]  Contact Information:     Housing/Transportation Living arrangements for the past 2 months:  Group Home [facility] Source of Information:  Patient, Medical Team, Other (Comment Required) (chart review) Patient Interpreter Needed:  None Criminal Activity/Legal Involvement Pertinent to Current Situation/Hospitalization:  No - Comment as needed Significant Relationships:  Church, [employer], Buyer, retail, Siblings Lives with:  Facility Resident Do you feel safe going back to the place where you live?    Need for family participation in patient care:  Yes (Comment)  Care giving concerns: Pt is in a mental health group home, skilled care will need to be provided by home health agency or patient might need to go to SNF depending on wound care needs at dc.    Social Worker assessment / plan: CSW was referred to Pt to assist with dc planning. Pt is divorced and living in mental health group home in [place]. Pt has 2 brothers, her brother [name] is her main support.  Pt has been living in group homes since she was in her 48's.  Patient continues to be her own guardian.  Pt is well known to the mental health community due to her chronic needs and frequent request for a new group home. Pt's pattern is that she request a new group home when she becomes upset with either her family or the group home staff.  Today she tells this CSW that she is upset with [name] for not "doing what he is supposed to do." CSW asked  Pt specifically what it was that he was supposed to do and Pt reported "come stay with me in the hospital."  CSW advised Pt that this is not typical of group home owners. CSW advised Pt that we would not be able to find her a new group home while she is in the hospital. She can be provided with the information if she would like to visit and find another one after she discharges from the hospital. CSW did discuss with Pt the possibility of needing SNF for wound care at dc depending on how the wound is healing. CSW completed Fl2 for signature and will follow up again in the morning as Pt's care progresses.   Employment status:  Disabled (Comment on whether or not currently receiving Disability) Insurance information:  Medicare, Medicaid In [place] PT Recommendations:  Not assessed at this time Information / Referral to community resources:  Skilled Nursing Facility  Patient/Family's Response to care:  Pt is pleased with care in the hospital. She states that her family has not visited her. She has no activities in the room other than a tv. CSW asked Pt what she like doing and she mentioned word searches. Unit staff looking for word searches for Pt.   Patient/Family's Understanding of and Emotional Response to Diagnosis, Current Treatment, and Prognosis: Pt understands why she is in the hospital and the current treatment recommendation. Pt  is agreeable to SNF if she needs it. Since she is her own guardian, this was discussed with her. It is a possibility that she becomes fixated on going to rehab as a way to leave the group home. Pt will need continue emotional support and reassurance from staff while in the hospital.   Emotional Assessment Appearance:  Appears stated age Attitude/Demeanor/Rapport:  Irrational Affect (typically observed):  Flat Orientation:  Oriented to Self, Oriented to Place, Oriented to  Time, Oriented to Situation Alcohol / Substance use:  Never Used Psych involvement (Current and /or  in the community):  Outpatient Provider  Discharge Needs  Concerns to be addressed:  Adjustment to Illness, Coping/Stress Concerns, Discharge Planning Concerns, Mental Health Concerns Readmission within the last 30 days:  No Current discharge risk:  Cognitively Impaired, Psychiatric Illness Barriers to Discharge:  Continued Medical Work up   [employer], LCSW 01/03/2015, 9:58 PM

## 2015-01-03 NOTE — Anesthesia Postprocedure Evaluation (Signed)
  Anesthesia Post-op Note  Patient: [name]  Procedure(s) Performed: Procedure(s): INCISION AND DRAINAGE ABSCESS (Right)  Anesthesia type:MAC  Patient location: PACU  Post pain: Pain level controlled  Post assessment: Post-op Vital signs reviewed, Patient's Cardiovascular Status Stable, Respiratory Function Stable, Patent Airway and No signs of Nausea or vomiting  Post vital signs: Reviewed and stable  Last Vitals:  Filed Vitals:   01/03/15 0517  BP: 125/71  Pulse: 74  Temp: 36.5 C  Resp: 24    Level of consciousness: awake, alert  and patient cooperative  Complications: No apparent anesthesia complications

## 2015-01-04 ENCOUNTER — Encounter: Payer: Self-pay | Admitting: Surgery

## 2015-01-04 MED ORDER — ESCITALOPRAM OXALATE 10 MG PO TABS
20.0000 mg | ORAL_TABLET | Freq: Every day | ORAL | Status: DC
  Administered 2015-01-04 – 2015-01-05 (×2): 20 mg via ORAL
  Filled 2015-01-04 (×2): qty 2

## 2015-01-04 MED ORDER — CLINDAMYCIN HCL 300 MG PO CAPS: 300.0000 mg | ORAL_CAPSULE | Freq: Three times a day (TID) | ORAL | Status: DC

## 2015-01-04 MED ORDER — HYDROCODONE-ACETAMINOPHEN 5-325 MG PO TABS: 1.0000 | ORAL_TABLET | ORAL | Status: DC | PRN

## 2015-01-04 MED ORDER — CLINDAMYCIN HCL 150 MG PO CAPS
300.0000 mg | ORAL_CAPSULE | Freq: Three times a day (TID) | ORAL | Status: DC
  Administered 2015-01-04 – 2015-01-06 (×7): 300 mg via ORAL
  Filled 2015-01-04 (×7): qty 2

## 2015-01-04 MED ORDER — CLONAZEPAM 0.5 MG PO TABS
0.5000 mg | ORAL_TABLET | Freq: Two times a day (BID) | ORAL | Status: DC
  Administered 2015-01-04 – 2015-01-06 (×5): 0.5 mg via ORAL
  Filled 2015-01-04 (×5): qty 1

## 2015-01-04 MED ORDER — ARIPIPRAZOLE 5 MG PO TABS
10.0000 mg | ORAL_TABLET | Freq: Every day | ORAL | Status: DC
  Administered 2015-01-04 – 2015-01-06 (×3): 10 mg via ORAL
  Filled 2015-01-04 (×3): qty 2

## 2015-01-04 NOTE — Clinical Documentation Improvement (Signed)
General Surgery Internal Medicine  Can the diagnosis of Sepsis be further specified?    Sepsis ruled in present and evolving on admission  Sepsis ruled out. Treating cellulitis and abscess of right lower leg  Other  Clinically Undetermined  Document any associated diagnoses/conditions.  Supporting Information:   Patient was febrile, tachycardic; WBC was 19.7 on admit. Documented only once by Dr. Elpidio Anis and nothing since 9/16 progress note.  Being treated with IV Vancomycin Q12H and being converted to PO Cleocin Q8H  Please exercise your independent, professional judgment when responding. A specific answer is not anticipated or expected.  Thank You,  Shellee Milo BSN, RN Health Information Management [place] [phone]

## 2015-01-04 NOTE — Clinical Social Work Note (Signed)
CSW spoke with [name] at patient's [facility] this morning to update him regarding the fact that the MD states patient more than likely will be ready for discharge back to the [facility] tomorrow and will have home health for dressing changes. Helyn Numbers was in agreement with this plan. He stated that there was no preference for home health agency. RN CM made aware. York Spaniel MSW,LCSW [phone]

## 2015-01-04 NOTE — Discharge Summary (Signed)
[facility] Physicians -  at [facility]   PATIENT NAME: [name]    MR#:  [id number]  DATE OF BIRTH:  [birth year]  DATE OF ADMISSION:  12/31/2014 ADMITTING PHYSICIAN: Oralia Manis, MD  DATE OF DISCHARGE:01/05/2015 PRIMARY CARE PHYSICIAN: Margaretann Loveless, MD    ADMISSION DIAGNOSIS:  Cellulitis of right lower extremity [L03.115]  DISCHARGE DIAGNOSIS:  Principal Problem:   Cellulitis and abscess of leg Active Problems:   GERD (gastroesophageal reflux disease)   COPD (chronic obstructive pulmonary disease)   Major depressive disorder, recurrent episode, moderate   Anxiety   SECONDARY DIAGNOSIS:   Past Medical History  Diagnosis Date  . Asthma   . Anxiety   . GERD (gastroesophageal reflux disease)   . Mild cognitive impairment   . Cerebral palsy   . Borderline personality disorder   . Depression     HOSPITAL COURSE:  49 year old female with cerebral palsy and mild cognitive impairment who presents with right lower extremity cellulitis from group home.  1. Right lower extremity cellulitis with abscess: Patient is postop day #3 I&D. She does not need wound vac. She will have wound care changes Monday, Wednesday and Friday..  Culture is growing out Staphylococcus aureus this is not MRSA. It is pansensitive. She will be discharged on oral Clindamycin.  2. History COPD: This is stable. 3. Abdominal pain: Patient is complaining of abdominal pain on the day of discharge. KUB showed no evidence of obstruction. She was tolerating her diet. There is no acute etiology for abdominal pain.     DISCHARGE CONDITIONS AND DIET:  Stable on regular diet  CONSULTS OBTAINED:  Treatment Team:  Tiney Rouge III, MD  DRUG ALLERGIES:   Allergies  Allergen Reactions  . Penicillins Other (See Comments)    Pt states that she is "just scared to take it".      DISCHARGE MEDICATIONS:   Current Discharge Medication List    START taking these medications   Details  clindamycin (CLEOCIN) 300 MG capsule Take 1 capsule (300 mg total) by mouth every 8 (eight) hours. Qty: 30 capsule, Refills: 0    HYDROcodone-acetaminophen (NORCO/VICODIN) 5-325 MG per tablet Take 1-2 tablets by mouth every 4 (four) hours as needed for moderate pain. Qty: 30 tablet, Refills: 0      CONTINUE these medications which have NOT CHANGED   Details  ARIPiprazole (ABILIFY) 10 MG tablet Take 1 tablet (10 mg total) by mouth daily. Qty: 30 tablet, Refills: 0    diphenoxylate-atropine (LOMOTIL) 2.5-0.025 MG per tablet Take 1 tablet by mouth every 6 (six) hours as needed for diarrhea or loose stools.    escitalopram (LEXAPRO) 20 MG tablet Take 1 tablet (20 mg total) by mouth at bedtime. Qty: 30 tablet, Refills: 0    fenofibrate 54 MG tablet Take 54 mg by mouth daily.    Fluticasone-Salmeterol (ADVAIR) 100-50 MCG/DOSE AEPB Inhale 1 puff into the lungs 2 (two) times daily.    meloxicam (MOBIC) 15 MG tablet Take 15 mg by mouth daily.    pantoprazole (PROTONIX) 40 MG tablet Take 40 mg by mouth daily.              Today   CHIEF COMPLAINT:  Patient complaining of abdominal pain. She did tolerate her diet this point.   VITAL SIGNS:  Blood pressure 123/84, pulse 65, temperature 97.8 F (36.6 C), temperature source Oral, resp. rate 16, height  (1.626 m), weight 98.431 kg (217 lb), SpO2 97 %.   REVIEW OF  SYSTEMS:  Review of Systems  Constitutional: Negative for fever, chills and malaise/fatigue.  HENT: Negative for sore throat.   Eyes: Negative for blurred vision.  Respiratory: Negative for cough, hemoptysis, shortness of breath and wheezing.   Cardiovascular: Negative for chest pain, palpitations and leg swelling.  Gastrointestinal: Positive for abdominal pain. Negative for nausea, vomiting, diarrhea and blood in stool.  Genitourinary: Negative for dysuria.  Musculoskeletal: Negative for back pain.  Neurological: Negative for dizziness, tremors and  headaches.  Endo/Heme/Allergies: Does not bruise/bleed easily.     PHYSICAL EXAMINATION:  GENERAL:  49 y.o.-year-old patient lying in the bed with no acute distress.  NECK:  Supple, no jugular venous distention. No thyroid enlargement, no tenderness.  LUNGS: Normal breath sounds bilaterally, no wheezing, rales,rhonchi  No use of accessory muscles of respiration.  CARDIOVASCULAR: S1, S2 normal. No murmurs, rubs, or gallops.  ABDOMEN: Soft, non-tender, non-distended. Bowel sounds present. No organomegaly or mass.  EXTREMITIES: No pedal edema, cyanosis, or clubbing.  PSYCHIATRIC: The patient is alert and oriented x 3.  SKIN: bandage placed.   DATA REVIEW:   CBC  Recent Labs Lab 01/03/15 0227  WBC 8.1  HGB 10.4*  HCT 30.9*  PLT 344    Chemistries   Recent Labs Lab 01/03/15 0227  NA 140  K 3.6  CL 105  CO2 29  GLUCOSE 107*  BUN 10  CREATININE 0.57  CALCIUM 8.8*    Cardiac Enzymes No results for input(s): TROPONINI in the last 168 hours.  Microbiology Results  @  RADIOLOGY:  No results found.    Management plans discussed with the patient and she is in agreement. Stable for discharge   Patient should follow up with SURGERY in 1 week  CODE STATUS:     Code Status Orders        Start     Ordered   12/31/14 2238  Full code   Continuous     12/31/14 2237      TOTAL TIME TAKING CARE OF THIS PATIENT: 30 minutes.    MODY, SITAL M.D on 01/05/2015 at 11:10 AM Between 7am to 6pm - Pager - [phone] After 6pm go to [email or web address] - password [email or web address] [facility]  [place]  Hospitalists  Office  [phone]  CC: Primary care physician; Margaretann Loveless, MD

## 2015-01-04 NOTE — Consult Note (Signed)
WOC wound consult note Reason for Consult:Patient had I & D to an abscess right anterior leg, pretibial area. Discharging back to group home today. Will need follow up orders for wound care.  Wound type:Infectious wound, S/P I & D Pressure Ulcer POA: N/A Measurement: 4 cm x 3 cm x 1.5 cm Wound bed:100% beefy red and nongranulating.  Friable and bleeds easily with cleaning.  Tender to touch.  Erythema to leg improved, as erythema is small than previously marked areas.  Drainage (amount, consistency, odor) Moderate serosanguinous drainage.  No odor.  Periwound:Erythema and edema, resolving.  Dressing procedure/placement/frequency:Cleanse ulcer to right anterior leg with NS and pat gently dry.  Gently fill wound bed with Aquacel Ag, silver hydrofiber dressing.  Cover with 4x4 gauze and secure with kerlix.  Change Mon/Wed/Fri.   Discussed not showering while leg is being bandaged, or to coordinate showering with [facility] nurse after discharge.  Will not follow at this time.  Please re-consult if needed.  Maple Hudson RN BSN CWON Pager [phone]

## 2015-01-04 NOTE — Care Management (Signed)
Informed by Charge Nurse Beth that patient had called [employer] to appeal her discharge . Patient is not up for discharge at this time. Conatced Maia Petties NCM assistant who stated that there was a form that needed to be sent and that she would take care of it. Lynden Ang stated that she had explained to patient procedure for appealing discharge and had told her to wait until discharge order given to call with appeal.

## 2015-01-04 NOTE — Care Management (Signed)
Refferal placed with Feliberto Gottron at [facility] for RN to perform wound care and dressing changes at group home 3 times weekly.

## 2015-01-04 NOTE — Progress Notes (Addendum)
[facility] [place] at [facility]   PATIENT NAME: [name]    MR#:  [id number]  DATE OF BIRTH:  16-Feb-1966  SUBJECTIVE:  Postop day #2 I&D. Patient is doing well this morning. REVIEW OF SYSTEMS:    Review of Systems  Constitutional: Negative for fever, chills and malaise/fatigue.  HENT: Negative for sore throat.   Eyes: Negative for blurred vision.  Respiratory: Negative for cough, hemoptysis, shortness of breath and wheezing.   Cardiovascular: Negative for chest pain, palpitations and leg swelling.  Gastrointestinal: Negative for nausea, vomiting, abdominal pain, diarrhea and blood in stool.  Genitourinary: Negative for dysuria.  Musculoskeletal: Negative for back pain.       Pain right leg with swelling  Neurological: Negative for dizziness, tremors and headaches.  Endo/Heme/Allergies: Does not bruise/bleed easily.    Tolerating Diet: Yes      DRUG ALLERGIES:   Allergies  Allergen Reactions  . Penicillins Other (See Comments)    Pt states that she is "just scared to take it".      VITALS:  Blood pressure 123/84, pulse 65, temperature 97.8 F (36.6 C), temperature source Oral, resp. rate 16, height  (1.626 m), weight 98.431 kg (217 lb), SpO2 97 %.  PHYSICAL EXAMINATION:   Physical Exam  Constitutional: She is oriented to person, place, and time and well-developed, well-nourished, and in no distress. No distress.  HENT:  Head: Normocephalic.  Eyes: No scleral icterus.  Neck: Normal range of motion. Neck supple. No JVD present. No tracheal deviation present.  Cardiovascular: Normal rate, regular rhythm and normal heart sounds.  Exam reveals no gallop and no friction rub.   No murmur heard. Pulmonary/Chest: Effort normal and breath sounds normal. No respiratory distress. She has no wheezes. She has no rales. She exhibits no tenderness.  Abdominal: Soft. Bowel sounds are normal. She exhibits no distension and no mass. There is  no tenderness. There is no rebound and no guarding.  Musculoskeletal: Normal range of motion. She exhibits no edema.  Neurological: She is alert and oriented to person, place, and time.  Skin: No rash noted. No erythema.  She has ace bandage wrapped   Psychiatric: Affect and judgment normal.      LABORATORY PANEL:   CBC  Recent Labs Lab 01/03/15 0227  WBC 8.1  HGB 10.4*  HCT 30.9*  PLT 344   ------------------------------------------------------------------------------------------------------------------  Chemistries   Recent Labs Lab 01/03/15 0227  NA 140  K 3.6  CL 105  CO2 29  GLUCOSE 107*  BUN 10  CREATININE 0.57  CALCIUM 8.8*   ------------------------------------------------------------------------------------------------------------------  Cardiac Enzymes No results for input(s): TROPONINI in the last 168 hours. ------------------------------------------------------------------------------------------------------------------  RADIOLOGY:  No results found.   ASSESSMENT AND PLAN:   49 year old female with cerebral palsy and mild cognitive impairment who presents with right lower extremity cellulitis from group home.  1. Right lower extremity cellulitis with abscess: Patient is postop day #2 I&D. She will have wound care consult prior to discharge. She does not need wound vac Culture is growing out Staphylococcus aureus this is not MRSA. It is pansensitive as per a conversation with microbiology. I will change her to oral Clindamycin.  2. History COPD: This is stable. 3 sepsis POA from #1. resolved  Ok to be d/c depending wound care consult   Management plans discussed with the patient and she is in agreement. Discussed with Surgery and CSW CODE STATUS: FULL  TOTAL TIME TAKING CARE OF THIS PATIENT: 25  minutes.     POSSIBLE D/C today, DEPENDING ON wound care orders  MODY, SITAL M.D on 01/04/2015 at 10:02 AM  Between 7am to 6pm - Pager -  [phone] After 6pm go to [email or web address] - password [email or web address] [facility]  [place] [facility]  Office  [phone]  CC: Primary care physician; Margaretann Loveless, MD

## 2015-01-04 NOTE — Progress Notes (Signed)
Spoke with Dr. [name] Pina re: patient's IV infiltrating.  Very hard stick.  Orders received to leave IV out.

## 2015-01-04 NOTE — Care Management Important Message (Signed)
Important Message  Patient Details  Name: [name] MRN: [id number] Date of Birth: Jul 22, 1965   Medicare Important Message Given:  Yes-second notification given    Verita Schneiders Allmond 01/04/2015, 2:08 PM

## 2015-01-04 NOTE — Progress Notes (Signed)
Subjective: 49 year old female postop day #2 from incision and drainage of right lower extremity abscess. Patient reports feeling better, however continues to have intermittent discomfort from the incision site. Denies any fevers, chills, nausea, vomiting, diarrhea, constipation.  Vital signs in last 24 hours: Temp:  [97.8 F (36.6 C)-98.5 F (36.9 C)] 97.8 F (36.6 C) (09/19 0637) Pulse Rate:  [65-79] 65 (09/19 0637) Resp:  [16-18] 16 (09/19 0637) BP: (111-123)/(60-84) 123/84 mmHg (09/19 0637) SpO2:  [97 %-100 %] 97 % (09/19 0637) Last BM Date: 01/04/15  Intake/Output from previous day: 09/18 0701 - 09/19 0700 In: 2512.3 [P.O.:720; I.V.:1792.3] Out: 1050 [Urine:1050]  Exam:  Right lower extremity wound dressed to reveal a 3.5 x 2.5 x 1.5 cm wound. There is no evidence of spreading erythema. There is no purulence on exam. Mild pitting edema distal to the wound.  Lab Results:  CBC  Recent Labs  01/02/15 0631 01/03/15 0227  WBC 11.9* 8.1  HGB 10.8* 10.4*  HCT 31.6* 30.9*  PLT 310 344   CMP     Component Value Date/Time   NA 140 01/03/2015 0227   NA 145 04/02/2014 1402   K 3.6 01/03/2015 0227   K 3.7 04/02/2014 1402   CL 105 01/03/2015 0227   CL 110* 04/02/2014 1402   CO2 29 01/03/2015 0227   CO2 28 04/02/2014 1402   GLUCOSE 107* 01/03/2015 0227   GLUCOSE 92 04/02/2014 1402   BUN 10 01/03/2015 0227   BUN 13 04/02/2014 1402   CREATININE 0.57 01/03/2015 0227   CREATININE 0.75 04/02/2014 1402   CALCIUM 8.8* 01/03/2015 0227   CALCIUM 8.4* 04/02/2014 1402   PROT 6.8 08/31/2014 1339   PROT 6.8 04/02/2014 1402   ALBUMIN 3.8 08/31/2014 1339   ALBUMIN 3.2* 04/02/2014 1402   AST 22 08/31/2014 1339   AST 32 04/02/2014 1402   ALT 18 08/31/2014 1339   ALT 46 04/02/2014 1402   ALKPHOS 45 08/31/2014 1339   ALKPHOS 61 04/02/2014 1402   BILITOT 0.4 08/31/2014 1339   BILITOT 0.2 04/02/2014 1402   GFRNONAA >60 01/03/2015 0227   GFRNONAA >60 04/02/2014 1402   GFRNONAA >60  12/19/2013 1334   GFRAA >60 01/03/2015 0227   GFRAA >60 04/02/2014 1402   GFRAA >60 12/19/2013 1334   PT/INR No results for input(s): LABPROT, INR in the last 72 hours.  Studies/Results: No results found.  Assessment/Plan: 49 year old female status post I&D of right lower extremity abscess. Given small size of the wound would recommend continuing wound care. Would obtain wound care consult for potential of longer term dressings. Consider aquacell versus AFM versus wound VAC.   Once wound care needs are fulfilled will be ok with discharge per primary team.  Ricarda Frame, MD [facility] General Surgeon [facility]

## 2015-01-05 ENCOUNTER — Encounter: Payer: Self-pay | Admitting: *Deleted

## 2015-01-05 ENCOUNTER — Inpatient Hospital Stay: Payer: Medicare Other

## 2015-01-05 LAB — WOUND CULTURE

## 2015-01-05 LAB — CBC
HEMATOCRIT: 29.3 % — AB (ref 35.0–47.0)
Hemoglobin: 10.2 g/dL — ABNORMAL LOW (ref 12.0–16.0)
MCH: 33 pg (ref 26.0–34.0)
MCHC: 34.7 g/dL (ref 32.0–36.0)
MCV: 95.2 fL (ref 80.0–100.0)
Platelets: 359 10*3/uL (ref 150–440)
RBC: 3.08 MIL/uL — AB (ref 3.80–5.20)
RDW: 13.5 % (ref 11.5–14.5)
WBC: 5.9 10*3/uL (ref 3.6–11.0)

## 2015-01-05 LAB — CULTURE, BLOOD (ROUTINE X 2): Culture: NO GROWTH

## 2015-01-05 NOTE — Care Management (Signed)
Discussed case with Dr [name] Pina  Who stated to me that the patient was still intending to appeal discharge. Dr [name] Pina stated that she feels patient is ready to be discharged today pending repeat KUB. Spoke with patient who stated to me that she had called [employer] today and that she didn't feel she was ready to be discharged today .  Patient had called [facility] yesterday when she was not up for discharge as well.  The writer had patient sign IM and notified NCM assistant Maia Petties. Awaiting information from medicare.

## 2015-01-05 NOTE — Progress Notes (Signed)
Per order, notified Dr [name] Pina that results of pt xray back

## 2015-01-05 NOTE — Care Management (Signed)
Read Detailed notice of discharge to patient and explained in detail that if appeal was not approved pt would be repsonsible for paying difference in bill. Patient expressed understanding and singed form.

## 2015-01-05 NOTE — Care Management Important Message (Signed)
Important Message  Patient Details  Name: [name] MRN: [id number] Date of Birth: 1965/08/18   Medicare Important Message Given:  Yes-third notification given patient signed and has called [employer] for appeal. Signed copy given to Pryor Ochoa to send.    Adonis Huguenin, RN 01/05/2015, 3:47 PM

## 2015-01-05 NOTE — Clinical Social Work Note (Signed)
Patient appealed her discharge and thus will stay in the hospital until medicare makes a determination. CSW attempted to reach [facility] at her [facility] but was unable to reach him. Patient is anticipated to discharge tomorrow. York Spaniel MSW,LCSW [phone]

## 2015-01-05 NOTE — Progress Notes (Signed)
3 Days Post-Op   Subjective: 49 year old female postop day #3 status post right lower extremity I&D. Patient seen by wound care nurse yesterday who started her on Aquacel Ag dressings. Patient is not very cooperative today and that she is primarily focused on disputing her discharge per the primary medicine team. Upon my investigation of her right lower extremity she then proceeded to complain of swelling and discomfort to the area. When asked that this was different than the day before patient was unable to answer.  Vital signs in last 24 hours: Temp:  [98 F (36.7 C)-98.3 F (36.8 C)] 98 F (36.7 C) (09/20 0512) Pulse Rate:  [72-73] 73 (09/20 0511) Resp:  [19] 19 (09/19 2120) BP: (130-142)/(73-80) 142/73 mmHg (09/20 0511) SpO2:  [97 %-98 %] 97 % (09/20 0511) Last BM Date: 01/04/15  Intake/Output from previous day: 09/19 0701 - 09/20 0700 In: 742 [P.O.:240; I.V.:502] Out: 1450 [Urine:1400; Emesis/NG output:50]  Gen.: No acute distress Chest: Clear to auscultation Heart: Regular rate and rhythm GI: soft, non-tender; bowel sounds normal; no masses,  no organomegaly Extremities: Right lower extremity with dressing in place that is clean dry and intact. Erythema and edema to the area decreased from prior exam.  Lab Results:  CBC  Recent Labs  01/03/15 0227 01/05/15 0632  WBC 8.1 5.9  HGB 10.4* 10.2*  HCT 30.9* 29.3*  PLT 344 359   CMP     Component Value Date/Time   NA 140 01/03/2015 0227   NA 145 04/02/2014 1402   K 3.6 01/03/2015 0227   K 3.7 04/02/2014 1402   CL 105 01/03/2015 0227   CL 110* 04/02/2014 1402   CO2 29 01/03/2015 0227   CO2 28 04/02/2014 1402   GLUCOSE 107* 01/03/2015 0227   GLUCOSE 92 04/02/2014 1402   BUN 10 01/03/2015 0227   BUN 13 04/02/2014 1402   CREATININE 0.57 01/03/2015 0227   CREATININE 0.75 04/02/2014 1402   CALCIUM 8.8* 01/03/2015 0227   CALCIUM 8.4* 04/02/2014 1402   PROT 6.8 08/31/2014 1339   PROT 6.8 04/02/2014 1402   ALBUMIN  3.8 08/31/2014 1339   ALBUMIN 3.2* 04/02/2014 1402   AST 22 08/31/2014 1339   AST 32 04/02/2014 1402   ALT 18 08/31/2014 1339   ALT 46 04/02/2014 1402   ALKPHOS 45 08/31/2014 1339   ALKPHOS 61 04/02/2014 1402   BILITOT 0.4 08/31/2014 1339   BILITOT 0.2 04/02/2014 1402   GFRNONAA >60 01/03/2015 0227   GFRNONAA >60 04/02/2014 1402   GFRNONAA >60 12/19/2013 1334   GFRAA >60 01/03/2015 0227   GFRAA >60 04/02/2014 1402   GFRAA >60 12/19/2013 1334   PT/INR No results for input(s): LABPROT, INR in the last 72 hours.  Studies/Results: Dg Abd 1 View  01/05/2015   CLINICAL DATA:  History of ileus.  EXAM: ABDOMEN - 1 VIEW  COMPARISON:  None.  FINDINGS: The bowel gas pattern is nonobstructive. There is relative paucity of small bowel gas. No radio-opaque calculi or other significant radiographic abnormality are seen. Cholecystectomy clips are noted.  IMPRESSION: No evidence of small-bowel obstruction.   Electronically Signed   By: Ted Mcalpine M.D.   On: 01/05/2015 10:14    Assessment/Plan: 49 year old female with right lower extremities wound from incision and drainage performed 3 days ago. Appreciate input per wound care nurse and agree with her wound care recommendations. Aquasol AG to be changed every Monday Wednesday and Friday. We will continue to follow while patient is an inpatient however no  surgical needs at this time. Continue local wound care as per wound care nurse. At time of discharge she can follow-up with general surgery in 2-3 weeks.  Ricarda Frame, MD FACS General Surgeon [facility]

## 2015-01-06 LAB — ANAEROBIC CULTURE

## 2015-01-06 NOTE — Progress Notes (Signed)
Pt stable. No IVs. Dressing changed. D/c instructions and education provided. Questions answered. Prescriptions given. Escorted out by Agricultural consultant.

## 2015-01-06 NOTE — Progress Notes (Signed)
[facility] [place] at [facility]   PATIENT NAME: [name]    MR#:  [id number]  DATE OF BIRTH:  Dec 19, 1965  SUBJECTIVE:  . Patient c/o abdominal pain  REVIEW OF SYSTEMS:    Review of Systems  Constitutional: Negative for fever, chills and malaise/fatigue.  HENT: Negative for sore throat.   Eyes: Negative for blurred vision.  Respiratory: Negative for cough, hemoptysis, shortness of breath and wheezing.   Cardiovascular: Negative for chest pain, palpitations and leg swelling.  Gastrointestinal: Positive for abdominal pain. Negative for nausea, vomiting, diarrhea and blood in stool.  Genitourinary: Negative for dysuria.  Musculoskeletal: Negative for back pain.  Neurological: Negative for dizziness, tremors and headaches.  Endo/Heme/Allergies: Does not bruise/bleed easily.    Tolerating Diet: Yes      DRUG ALLERGIES:   Allergies  Allergen Reactions  . Penicillins Other (See Comments)    Pt states that she is "just scared to take it".      VITALS:  Blood pressure 128/78, pulse 75, temperature 98.3 F (36.8 C), temperature source Oral, resp. rate 18, height  (1.626 m), weight 98.431 kg (217 lb), SpO2 98 %.  PHYSICAL EXAMINATION:   Physical Exam  Constitutional: She is oriented to person, place, and time and well-developed, well-nourished, and in no distress. No distress.  HENT:  Head: Normocephalic.  Eyes: No scleral icterus.  Neck: Normal range of motion. Neck supple. No JVD present. No tracheal deviation present.  Cardiovascular: Normal rate, regular rhythm and normal heart sounds.  Exam reveals no gallop and no friction rub.   No murmur heard. Pulmonary/Chest: Effort normal and breath sounds normal. No respiratory distress. She has no wheezes. She has no rales. She exhibits no tenderness.  Abdominal: Soft. Bowel sounds are normal. She exhibits no distension and no mass. There is no tenderness. There is no rebound and no  guarding.  Musculoskeletal: Normal range of motion. She exhibits no edema.  Neurological: She is alert and oriented to person, place, and time.  Skin: No rash noted. No erythema.  She has ace bandage wrapped   Psychiatric: Affect and judgment normal.      LABORATORY PANEL:   CBC  Recent Labs Lab 01/05/15 0632  WBC 5.9  HGB 10.2*  HCT 29.3*  PLT 359   ------------------------------------------------------------------------------------------------------------------  Chemistries   Recent Labs Lab 01/03/15 0227  NA 140  K 3.6  CL 105  CO2 29  GLUCOSE 107*  BUN 10  CREATININE 0.57  CALCIUM 8.8*   ------------------------------------------------------------------------------------------------------------------  Cardiac Enzymes No results for input(s): TROPONINI in the last 168 hours. ------------------------------------------------------------------------------------------------------------------  RADIOLOGY:  Dg Abd 1 View  01/05/2015   CLINICAL DATA:  History of ileus.  EXAM: ABDOMEN - 1 VIEW  COMPARISON:  None.  FINDINGS: The bowel gas pattern is nonobstructive. There is relative paucity of small bowel gas. No radio-opaque calculi or other significant radiographic abnormality are seen. Cholecystectomy clips are noted.  IMPRESSION: No evidence of small-bowel obstruction.   Electronically Signed   By: Ted Mcalpine M.D.   On: 01/05/2015 10:14     ASSESSMENT AND PLAN:   49 year old female with cerebral palsy and mild cognitive impairment who presents with right lower extremity cellulitis from group home.  1. Right lower extremity cellulitis with abscess: Patient is postop I&D. She does not need wound vac Culture is growing out Staphylococcus aureus this is not MRSA. It is pansensitive Continue Clindamycin.  2. History COPD: This is stable. 3 sepsis POA from #  1. resolved 4. Abdominal; pain KUB no evidence of SBO. Monitor   Ok to be d/c depending on appeal  comittee  Management plans discussed with the patient and she is in agreement. Discussed with Surgery and CSW CODE STATUS: FULL  TOTAL TIME TAKING CARE OF THIS PATIENT: 30 minutes.     POSSIBLE D/C today, DEPENDING ON appeal process  MODY, SITAL M.D on 01/05/2015 at 12:25 PM  Between 7am to 6pm - Pager - [phone] After 6pm go to [email or web address] - password [email or web address] [facility]  [place] [facility]  Office  [phone]  CC: Primary care physician; Margaretann Loveless, MD

## 2015-01-06 NOTE — Clinical Social Work Note (Signed)
Patient had called one of [facility]anda [facility] group homes [facility]) some time ago and called them again while she was here in hospital. Burna Mortimer from [facility] came to see patient and has stated that they can take her today and that they have already spoken to [place] at her other group home. CSW asked RN CM if patient needed to stay until medicare determination was in and CSW was informed to have patient call the [phone] number for the medicare appeal and cancel her appeal request. CSW has informed patient to do this. Patient to discharge today to [facility] group home.  York Spaniel MSW,LCSW [phone]

## 2015-01-06 NOTE — Discharge Summary (Signed)
[facility] - [place] at [facility]   PATIENT NAME: [name]    MR#:  [id number]  DATE OF BIRTH:  Jan 06, 1966  DATE OF ADMISSION:  12/31/2014 ADMITTING PHYSICIAN: Oralia Manis, MD  DATE OF DISCHARGE:01/06/2015 PRIMARY CARE PHYSICIAN: Margaretann Loveless, MD    ADMISSION DIAGNOSIS:  Cellulitis of right lower extremity [L03.115]  DISCHARGE DIAGNOSIS:  Principal Problem:   Cellulitis and abscess of leg Active Problems:   GERD (gastroesophageal reflux disease)   COPD (chronic obstructive pulmonary disease)   Major depressive disorder, recurrent episode, moderate   Anxiety   SECONDARY DIAGNOSIS:   Past Medical History  Diagnosis Date  . Asthma   . Anxiety   . GERD (gastroesophageal reflux disease)   . Mild cognitive impairment   . Cerebral palsy   . Borderline personality disorder   . Depression     HOSPITAL COURSE:  49 year old female with cerebral palsy and mild cognitive impairment who presents with right lower extremity cellulitis from group home.  1. Right lower extremity cellulitis with abscess: Patient is postop day #4 I&D. She does not need wound vac. She will have wound care changes Monday, Wednesday and Friday..  Culture is growing out Staphylococcus aureus this is not MRSA. It is pansensitive. She will be discharged on oral Clindamycin.  2. History COPD: This is stable. 3. Abdominal pain:  KUB showed no evidence of obstruction. She is tolerating her diet. There is no acute etiology for abdominal pain and not complaining of pain today.    DISCHARGE CONDITIONS AND DIET:  Stable on regular diet  CONSULTS OBTAINED:  Treatment Team:  Tiney Rouge III, MD  DRUG ALLERGIES:   Allergies  Allergen Reactions  . Penicillins Other (See Comments)    Pt states that she is "just scared to take it".      DISCHARGE MEDICATIONS:   Current Discharge Medication List    START taking these medications   Details  clindamycin (CLEOCIN) 300 MG  capsule Take 1 capsule (300 mg total) by mouth every 8 (eight) hours. Qty: 30 capsule, Refills: 0    HYDROcodone-acetaminophen (NORCO/VICODIN) 5-325 MG per tablet Take 1-2 tablets by mouth every 4 (four) hours as needed for moderate pain. Qty: 30 tablet, Refills: 0      CONTINUE these medications which have NOT CHANGED   Details  ARIPiprazole (ABILIFY) 10 MG tablet Take 1 tablet (10 mg total) by mouth daily. Qty: 30 tablet, Refills: 0    diphenoxylate-atropine (LOMOTIL) 2.5-0.025 MG per tablet Take 1 tablet by mouth every 6 (six) hours as needed for diarrhea or loose stools.    escitalopram (LEXAPRO) 20 MG tablet Take 1 tablet (20 mg total) by mouth at bedtime. Qty: 30 tablet, Refills: 0    fenofibrate 54 MG tablet Take 54 mg by mouth daily.    Fluticasone-Salmeterol (ADVAIR) 100-50 MCG/DOSE AEPB Inhale 1 puff into the lungs 2 (two) times daily.    meloxicam (MOBIC) 15 MG tablet Take 15 mg by mouth daily.    pantoprazole (PROTONIX) 40 MG tablet Take 40 mg by mouth daily.              Today   CHIEF COMPLAINT:  Patient denies abdominal pain. She appealed the discharge and therefore was not discharged yesterday. We are awaiting to hear back from the appeals committee.   VITAL SIGNS:  Blood pressure 128/78, pulse 75, temperature 98.3 F (36.8 C), temperature source Oral, resp. rate 18, height  (1.626 m), weight 98.431 kg (217  lb), SpO2 98 %.   REVIEW OF SYSTEMS:  Review of Systems  Constitutional: Negative for fever, chills and malaise/fatigue.  HENT: Negative for sore throat.   Eyes: Negative for blurred vision.  Respiratory: Negative for cough, hemoptysis, shortness of breath and wheezing.   Cardiovascular: Negative for chest pain, palpitations and leg swelling.  Gastrointestinal: Negative for nausea, vomiting, abdominal pain, diarrhea and blood in stool.  Genitourinary: Negative for dysuria.  Musculoskeletal: Negative for back pain.  Neurological: Negative  for dizziness, tremors and headaches.  Endo/Heme/Allergies: Does not bruise/bleed easily.     PHYSICAL EXAMINATION:  GENERAL:  49 y.o.-year-old patient lying in the bed with no acute distress.  NECK:  Supple, no jugular venous distention. No thyroid enlargement, no tenderness.  LUNGS: Normal breath sounds bilaterally, no wheezing, rales,rhonchi  No use of accessory muscles of respiration.  CARDIOVASCULAR: S1, S2 normal. No murmurs, rubs, or gallops.  ABDOMEN: Soft, non-tender, non-distended. Bowel sounds present. No organomegaly or mass.  EXTREMITIES: No pedal edema, cyanosis, or clubbing.  PSYCHIATRIC: The patient is alert and oriented x 3.  SKIN: bandage placed no drainage  DATA REVIEW:   CBC  Recent Labs Lab 01/05/15 0632  WBC 5.9  HGB 10.2*  HCT 29.3*  PLT 359    Chemistries   Recent Labs Lab 01/03/15 0227  NA 140  K 3.6  CL 105  CO2 29  GLUCOSE 107*  BUN 10  CREATININE 0.57  CALCIUM 8.8*    Cardiac Enzymes No results for input(s): TROPONINI in the last 168 hours.  Microbiology Results  @  RADIOLOGY:  Dg Abd 1 View  01/05/2015   CLINICAL DATA:  History of ileus.  EXAM: ABDOMEN - 1 VIEW  COMPARISON:  None.  FINDINGS: The bowel gas pattern is nonobstructive. There is relative paucity of small bowel gas. No radio-opaque calculi or other significant radiographic abnormality are seen. Cholecystectomy clips are noted.  IMPRESSION: No evidence of small-bowel obstruction.   Electronically Signed   By: Ted Mcalpine M.D.   On: 01/05/2015 10:14      Management plans discussed with the patient and she is in agreement. Stable for discharge   Patient should follow up with SURGERY in 1 week  CODE STATUS:     Code Status Orders        Start     Ordered   12/31/14 2238  Full code   Continuous     12/31/14 2237      TOTAL TIME TAKING CARE OF THIS PATIENT: 35 minutes.    MODY, SITAL M.D on 01/05/2015 at 11:10 AM Between 7am to 6pm -  Pager - [phone] After 6pm go to [email or web address] - password [email or web address] [facility]  [facility]  Office  [phone]  CC: Primary care physician; Margaretann Loveless, MD

## 2015-01-08 ENCOUNTER — Encounter: Payer: Self-pay | Admitting: *Deleted

## 2015-01-12 ENCOUNTER — Ambulatory Visit (INDEPENDENT_AMBULATORY_CARE_PROVIDER_SITE_OTHER): Payer: Self-pay | Admitting: Surgery

## 2015-01-12 ENCOUNTER — Encounter (INDEPENDENT_AMBULATORY_CARE_PROVIDER_SITE_OTHER): Payer: Self-pay

## 2015-01-12 ENCOUNTER — Telehealth: Payer: Self-pay | Admitting: *Deleted

## 2015-01-12 ENCOUNTER — Encounter: Payer: Self-pay | Admitting: Surgery

## 2015-01-12 VITALS — BP 124/73 | HR 74 | Temp 98.3°F | Ht 63.0 in | Wt 223.0 lb

## 2015-01-12 DIAGNOSIS — L03119 Cellulitis of unspecified part of limb: Secondary | ICD-10-CM

## 2015-01-12 DIAGNOSIS — L02419 Cutaneous abscess of limb, unspecified: Secondary | ICD-10-CM

## 2015-01-12 NOTE — Telephone Encounter (Signed)
Patient referred, as per Dr Egbert Garibaldi, to [facility] wound care. Message sent to Angie to set up the referral.

## 2015-01-12 NOTE — Progress Notes (Signed)
Status post incision and drainage of right anterior leg wound. Today seen in the office. She is without complaints. She's getting home health nursing.  On physical examination there is no cellulitis present there is a 3 cm x 3 cm x 0.5 cm deep clean granulating based ulcer on the anterior mid lower leg. There is no cellulitis present.  Impression chronic wound right lower extremity.  Plan referral to wound care center.

## 2015-01-12 NOTE — Patient Instructions (Signed)
You are being referred to the wound center in [place]. Expect to receive a call in the next three days from the wound center to schedule your first appointment. You do not need to follow up with our office.

## 2015-01-14 ENCOUNTER — Telehealth: Payer: Self-pay | Admitting: Gastroenterology

## 2015-01-14 NOTE — Telephone Encounter (Signed)
Arline Asp from [facility] called and wants to add some stuff to patients orders for wound care, please call her back at [phone]

## 2015-01-15 NOTE — Telephone Encounter (Signed)
Return phone call made to [facility] at this time with [facility].  She needed verbal order to use generic dressing instead of brand name dressing on patient's orders due to insurance purposes. Verbal order given at this time to do so.  Also explained that patient has been referred to Wound Care Center and will be seeing Dr. Lawerance Bach on 01/20/15. Then Wound center will decide on dressing change instructions and appropriate type of dressing to be placed.

## 2015-01-20 ENCOUNTER — Encounter: Payer: Medicare Other | Attending: Surgery | Admitting: Surgery

## 2015-01-20 DIAGNOSIS — X58XXXA Exposure to other specified factors, initial encounter: Secondary | ICD-10-CM | POA: Insufficient documentation

## 2015-01-20 DIAGNOSIS — F419 Anxiety disorder, unspecified: Secondary | ICD-10-CM | POA: Insufficient documentation

## 2015-01-20 DIAGNOSIS — L97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 with fat layer exposed: Secondary | ICD-10-CM | POA: Diagnosis not present

## 2015-01-20 DIAGNOSIS — S81801A Unspecified open wound, right lower leg, initial encounter: Secondary | ICD-10-CM | POA: Insufficient documentation

## 2015-01-20 DIAGNOSIS — K219 Gastro-esophageal reflux disease without esophagitis: Secondary | ICD-10-CM | POA: Diagnosis not present

## 2015-01-20 DIAGNOSIS — F603 Borderline personality disorder: Secondary | ICD-10-CM | POA: Diagnosis not present

## 2015-01-20 DIAGNOSIS — J45909 Unspecified asthma, uncomplicated: Secondary | ICD-10-CM | POA: Insufficient documentation

## 2015-01-21 NOTE — Progress Notes (Signed)
GENESIS, NOVOSAD ([id number]) Visit Report for 01/20/2015 Allergy List Details Patient Name: [name], [name] Date of Service: 01/20/2015 9:00 AM Medical Record Number: [id number] Patient Account Number: [id number] Date of Birth/Sex: [birth year] (49 y.o. Female) Treating RN: Huel Coventry Primary Care Physician: Yves Dill Other Clinician: Referring Physician: Natale Lay Treating Physician/Extender: BURNS III, Regis Bill in Treatment: 0 Allergies Active Allergies penicillin Allergy Notes Electronic Signature(s) Signed: 01/20/2015 4:54:35 PM By: Elliot Gurney, RN, BSN, Kim RN, BSN Entered By: Elliot Gurney, RN, BSN, Kim on 01/20/2015 09:21:26 [name] ([id number]) -------------------------------------------------------------------------------- Arrival Information Details Patient Name: [name] Date of Service: 01/20/2015 9:00 AM Medical Record Number: [id number] Patient Account Number: [id number] Date of Birth/Sex: [birth year] (49 y.o. Female) Treating RN: Huel Coventry Primary Care Physician: Yves Dill Other Clinician: Referring Physician: Natale Lay Treating Physician/Extender: BURNS III, Regis Bill in Treatment: 0 Visit Information Patient Arrived: Ambulatory Arrival Time: 06:07 Accompanied By: caregiver Transfer Assistance: None Patient Identification Verified: Yes Secondary Verification Process Yes Completed: Electronic Signature(s) Signed: 01/20/2015 4:54:35 PM By: Elliot Gurney RN, BSN, Kim RN, BSN Entered By: Elliot Gurney, RN, BSN, Kim on 01/20/2015 09:08:07 [name] ([id number]) -------------------------------------------------------------------------------- Clinic Level of Care Assessment Details Patient Name: [name] Date of Service: 01/20/2015 9:00 AM Medical Record Number: [id number] Patient Account Number: [id number] Date of Birth/Sex: 10/25/1965 (49 y.o. Female) Treating RN: Huel Coventry Primary Care Physician: Yves Dill Other Clinician: Referring  Physician: Natale Lay Treating Physician/Extender: BURNS III, Regis Bill in Treatment: 0 Clinic Level of Care Assessment Items TOOL 1 Quantity Score  - Use when EandM and Procedure is performed on INITIAL visit 0 ASSESSMENTS - Nursing Assessment / Reassessment X - General Physical Exam (combine w/ comprehensive assessment (listed just 1 20 below) when performed on new pt. evals) X - Comprehensive Assessment (HX, ROS, Risk Assessments, Wounds Hx, etc.) 1 25 ASSESSMENTS - Wound and Skin Assessment / Reassessment  - Dermatologic / Skin Assessment (not related to wound area) 0 ASSESSMENTS - Ostomy and/or Continence Assessment and Care  - Incontinence Assessment and Management 0  - Ostomy Care Assessment and Management (repouching, etc.) 0 PROCESS - Coordination of Care X - Simple Patient / Family Education for ongoing care 1 15  - Complex (extensive) Patient / Family Education for ongoing care 0 X - Staff obtains Chiropractor, Records, Test Results / Process Orders 1 10  - Staff telephones HHA, Nursing Homes / Clarify orders / etc 0  - Routine Transfer to another Facility (non-emergent condition) 0  - Routine Hospital Admission (non-emergent condition) 0 X - New Admissions / Manufacturing engineer / Ordering NPWT, Apligraf, etc. 1 15  - Emergency Hospital Admission (emergent condition) 0 PROCESS - Special Needs  - Pediatric / Minor Patient Management 0  - Isolation Patient Management 0 NAMIAH, DUNNAVANT ([id number])  - Hearing / Language / Visual special needs 0  - Assessment of Community assistance (transportation, D/C planning, etc.) 0  - Additional assistance / Altered mentation 0  - Support Surface(s) Assessment (bed, cushion, seat, etc.) 0 INTERVENTIONS - Miscellaneous  - External ear exam 0  - Patient Transfer (multiple staff / Nurse, adult / Similar devices) 0  - Simple Staple / Suture removal (25 or less) 0  - Complex Staple / Suture removal  (26 or more) 0  - Hypo/Hyperglycemic Management (do not check if billed separately) 0  - Ankle / Brachial Index (ABI) - do not check if billed separately 0 Has the patient been seen at the hospital within the last three years: Yes Total Score: 85 Level Of  Care: New/Established - Level 3 Electronic Signature(s) Signed: 01/20/2015 4:54:35 PM By: Elliot Gurney, RN, BSN, Kim RN, BSN Entered By: Elliot Gurney, RN, BSN, Kim on 01/20/2015 09:58:42 [name] ([id number]) -------------------------------------------------------------------------------- Encounter Discharge Information Details Patient Name: [name] Date of Service: 01/20/2015 9:00 AM Medical Record Number: [id number] Patient Account Number: [id number] Date of Birth/Sex: 1966-02-28 (49 y.o. Female) Treating RN: Huel Coventry Primary Care Physician: Yves Dill Other Clinician: Referring Physician: Natale Lay Treating Physician/Extender: BURNS III, Regis Bill in Treatment: 0 Encounter Discharge Information Items Discharge Pain Level: 0 Discharge Condition: Stable Ambulatory Status: Ambulatory Discharge Destination: Home Transportation: Private Auto Accompanied By: caregiver Schedule Follow-up Appointment: Yes Medication Reconciliation completed and provided to Patient/Care Yes Jerel Sardina: Provided on Clinical Summary of Care: 01/20/2015 Form Type Recipient Paper Patient [place] Electronic Signature(s) Signed: 01/20/2015 4:54:35 PM By: Elliot Gurney, RN, BSN, Kim RN, BSN Previous Signature: 01/20/2015 9:52:28 AM Version By: Gwenlyn Perking Entered By: Elliot Gurney RN, BSN, Kim on 01/20/2015 10:00:22 [name] ([id number]) -------------------------------------------------------------------------------- Lower Extremity Assessment Details Patient Name: [name] Date of Service: 01/20/2015 9:00 AM Medical Record Number: [id number] Patient Account Number: [id number] Date of Birth/Sex: 01/14/1966 (49 y.o. Female) Treating RN: Huel Coventry Primary Care Physician: Yves Dill Other Clinician: Referring Physician: Natale Lay Treating Physician/Extender: BURNS III, Regis Bill in Treatment: 0 Vascular Assessment Pulses: Posterior Tibial Palpable: [Right:Yes] Doppler: [Right:Multiphasic] Dorsalis Pedis Palpable: [Right:Yes] Doppler: [Right:Multiphasic] Extremity colors, hair growth, and conditions: Extremity Color: [Right:Normal] Hair Growth on Extremity: [Right:Yes] Temperature of Extremity: [Right:Warm] Capillary Refill: [Right:< 3 seconds] Blood Pressure: Brachial: [Right:117] Dorsalis Pedis: [Left:Dorsalis Pedis: 140] Ankle: Posterior Tibial: [Left:Posterior Tibial: 132] [Right:1.20] Toe Nail Assessment Left: Right: Thick: No Discolored: No Deformed: No Improper Length and Hygiene: No Electronic Signature(s) Signed: 01/20/2015 4:54:35 PM By: Elliot Gurney, RN, BSN, Kim RN, BSN Entered By: Elliot Gurney, RN, BSN, Kim on 01/20/2015 09:17:51 [name] ([id number]) -------------------------------------------------------------------------------- Multi Wound Chart Details Patient Name: [name] Date of Service: 01/20/2015 9:00 AM Medical Record Number: [id number] Patient Account Number: [id number] Date of Birth/Sex: Jun 30, 1965 (49 y.o. Female) Treating RN: Huel Coventry Primary Care Physician: Yves Dill Other Clinician: Referring Physician: Natale Lay Treating Physician/Extender: BURNS III, Regis Bill in Treatment: 0 Vital Signs Height(in): 64 Pulse(bpm): 75 Weight(lbs): 215 Blood Pressure 117/74 (mmHg): Body Mass Index(BMI): 37 Temperature(F): 98.8 Respiratory Rate 18 (breaths/min): Photos: [1:No Photos] [N/A:N/A] Wound Location: [1:Right Lower Leg - Medial] [N/A:N/A] Wounding Event: [1:Bite] [N/A:N/A] Primary Etiology: [1:To be determined] [N/A:N/A] Date Acquired: [1:12/28/2014] [N/A:N/A] Weeks of Treatment: [1:0] [N/A:N/A] Wound Status: [1:Open] [N/A:N/A] Measurements L x W x D  2.5x3x0.1 [N/A:N/A] (cm) Area (cm) : [1:5.89] [N/A:N/A] Volume (cm) : [1:0.589] [N/A:N/A] % Reduction in Area: [1:0.00%] [N/A:N/A] % Reduction in Volume: 0.00% [N/A:N/A] Classification: [1:Full Thickness Without Exposed Support Structures] [N/A:N/A] Exudate Amount: [1:Medium] [N/A:N/A] Exudate Type: [1:Purulent] [N/A:N/A] Exudate Color: [1:yellow, brown, green] [N/A:N/A] Wound Margin: [1:Flat and Intact] [N/A:N/A] Exposed Structures: [1:Fascia: No Fat: No Tendon: No Muscle: No Joint: No Bone: No Limited to Skin Breakdown] [N/A:N/A] Debridement: [1:Debridement ([id number])] [N/A:N/A] Time-Out Taken: Yes N/A N/A Pain Control: Other N/A N/A Tissue Debrided: Fibrin/Slough, Exudates, N/A N/A Subcutaneous Level: Skin/Subcutaneous N/A N/A Tissue Debridement Area (sq 7.5 N/A N/A cm): Instrument: Curette N/A N/A Bleeding: Minimum N/A N/A Hemostasis Achieved: Pressure N/A N/A Procedural Pain: 0 N/A N/A Post Procedural Pain: 0 N/A N/A Debridement Treatment Procedure was tolerated N/A N/A Response: well Post Debridement 2.5x3x0.1 N/A N/A Measurements L x W x D (cm) Post Debridement 0.589 N/A N/A Volume: (cm) Periwound Skin Texture: Scarring: Yes N/A N/A  Edema: No Excoriation: No Induration: No Callus: No Crepitus: No Fluctuance: No Friable: No Rash: No Periwound Skin Maceration: No N/A N/A Moisture: Moist: No Dry/Scaly: No Periwound Skin Color: Ecchymosis: Yes N/A N/A Erythema: Yes Atrophie Blanche: No Cyanosis: No Hemosiderin Staining: No Mottled: No Pallor: No Rubor: No Tenderness on No N/A N/A Palpation: Wound Preparation: Ulcer Cleansing: N/A N/A Rinsed/Irrigated with Saline Topical Anesthetic Applied: Other: lidocaine 4% Procedures Performed: Debridement N/A N/A ROBERTINE, KIPPER ([id number]) Treatment Notes Electronic Signature(s) Signed: 01/20/2015 4:54:35 PM By: Elliot Gurney, RN, BSN, Kim RN, BSN Entered By: Elliot Gurney, RN, BSN, Kim on 01/20/2015  09:41:25 [name] ([id number]) -------------------------------------------------------------------------------- Multi-Disciplinary Care Plan Details Patient Name: [name] Date of Service: 01/20/2015 9:00 AM Medical Record Number: [id number] Patient Account Number: [id number] Date of Birth/Sex: 1965/06/25 (49 y.o. Female) Treating RN: Huel Coventry Primary Care Physician: Yves Dill Other Clinician: Referring Physician: Natale Lay Treating Physician/Extender: BURNS III, Regis Bill in Treatment: 0 Active Inactive Orientation to the Wound Care Program Nursing Diagnoses: Knowledge deficit related to the wound healing center program Goals: Patient/caregiver will verbalize understanding of the Wound Healing Center Program Date Initiated: 01/20/2015 Goal Status: Active Interventions: Provide education on orientation to the wound center Notes: Soft Tissue Infection Nursing Diagnoses: Potential for infection: soft tissue Goals: Patient will remain free of wound infection Date Initiated: 01/20/2015 Goal Status: Active Interventions: Assess signs and symptoms of infection every visit Treatment Activities: Culture and sensitivity : 01/20/2015 Notes: Wound/Skin Impairment Nursing Diagnoses: Impaired tissue integrity DER, GAGLIANO ([id number]) Goals: Ulcer/skin breakdown will heal within 14 weeks Date Initiated: 01/20/2015 Goal Status: Active Interventions: Assess patient/caregiver ability to obtain necessary supplies Notes: Electronic Signature(s) Signed: 01/20/2015 4:54:35 PM By: Elliot Gurney, RN, BSN, Kim RN, BSN Entered By: Elliot Gurney, RN, BSN, Kim on 01/20/2015 09:41:15 [name] ([id number]) -------------------------------------------------------------------------------- Pain Assessment Details Patient Name: [name] Date of Service: 01/20/2015 9:00 AM Medical Record Number: [id number] Patient Account Number: [id number] Date of Birth/Sex: December 25, 1965 (49  y.o. Female) Treating RN: Huel Coventry Primary Care Physician: Yves Dill Other Clinician: Referring Physician: Natale Lay Treating Physician/Extender: BURNS III, Regis Bill in Treatment: 0 Active Problems Location of Pain Severity and Description of Pain Patient Has Paino No Site Locations Pain Management and Medication Current Pain Management: Electronic Signature(s) Signed: 01/20/2015 4:54:35 PM By: Elliot Gurney, RN, BSN, Kim RN, BSN Entered By: Elliot Gurney, RN, BSN, Kim on 01/20/2015 09:08:13 [name] ([id number]) -------------------------------------------------------------------------------- Patient/Caregiver Education Details Patient Name: [name] Date of Service: 01/20/2015 9:00 AM Medical Record Number: [id number] Patient Account Number: [id number] Date of Birth/Gender: 1966/01/25 (49 y.o. Female) Treating RN: Huel Coventry Primary Care Physician: Yves Dill Other Clinician: Referring Physician: Natale Lay Treating Physician/Extender: BURNS III, Regis Bill in Treatment: 0 Education Assessment Education Provided To: Patient Education Topics Provided Welcome To The Wound Care Center: Handouts: Welcome To The Wound Care Center Methods: Demonstration, Explain/Verbal Responses: State content correctly Wound/Skin Impairment: Handouts: Caring for Your Ulcer, Other: wound care as prescribed Methods: Demonstration, Explain/Verbal Responses: State content correctly Electronic Signature(s) Signed: 01/20/2015 4:54:35 PM By: Elliot Gurney, RN, BSN, Kim RN, BSN Entered By: Elliot Gurney, RN, BSN, Kim on 01/20/2015 10:01:38 [name] ([id number]) -------------------------------------------------------------------------------- Wound Assessment Details Patient Name: [name] Date of Service: 01/20/2015 9:00 AM Medical Record Number: [id number] Patient Account Number: [id number] Date of Birth/Sex: Nov 25, 1965 (49 y.o. Female) Treating RN: Huel Coventry Primary Care Physician:  Yves Dill Other Clinician: Referring Physician: Natale Lay Treating Physician/Extender: BURNS III, Regis Bill in Treatment: 0 Wound Status Wound Number: 1 Primary Etiology: To be determined Wound Location:  Right Lower Leg - Medial Wound Status: Open Wounding Event: Bite Date Acquired: 12/28/2014 Weeks Of Treatment: 0 Clustered Wound: No Photos Photo Uploaded By: Elliot Gurney, RN, BSN, Kim on 01/20/2015 16:58:55 Wound Measurements Length: (cm) 2.5 Width: (cm) 3 Depth: (cm) 0.1 Area: (cm) 5.89 Volume: (cm) 0.589 % Reduction in Area: 0% % Reduction in Volume: 0% Wound Description Full Thickness Without Exposed Classification: Support Structures Wound Margin: Flat and Intact Exudate Medium Amount: Exudate Type: Purulent Exudate Color: yellow, brown, green Foul Odor After Cleansing: No Wound Bed Exposed Structure Fascia Exposed: No Fat Layer Exposed: No Klich, Costella ([id number]) Tendon Exposed: No Muscle Exposed: No Joint Exposed: No Bone Exposed: No Limited to Skin Breakdown Periwound Skin Texture Texture Color No Abnormalities Noted: No No Abnormalities Noted: No Callus: No Atrophie Blanche: No Crepitus: No Cyanosis: No Excoriation: No Ecchymosis: Yes Fluctuance: No Erythema: Yes Friable: No Hemosiderin Staining: No Induration: No Mottled: No Localized Edema: No Pallor: No Rash: No Rubor: No Scarring: Yes Moisture No Abnormalities Noted: No Dry / Scaly: No Maceration: No Moist: No Wound Preparation Ulcer Cleansing: Rinsed/Irrigated with Saline Topical Anesthetic Applied: Other: lidocaine 4%, Electronic Signature(s) Signed: 01/20/2015 4:54:35 PM By: Elliot Gurney, RN, BSN, Kim RN, BSN Entered By: Elliot Gurney, RN, BSN, Kim on 01/20/2015 09:21:00 [name] ([id number]) -------------------------------------------------------------------------------- Vitals Details Patient Name: [name] Date of Service: 01/20/2015 9:00 AM Medical Record  Number: [id number] Patient Account Number: [id number] Date of Birth/Sex: [birth year] (49 y.o. Female) Treating RN: Huel Coventry Primary Care Physician: Yves Dill Other Clinician: Referring Physician: Natale Lay Treating Physician/Extender: BURNS III, Regis Bill in Treatment: 0 Vital Signs Time Taken: 09:08 Temperature (F): 98.8 Height (in): 64 Pulse (bpm): 75 Weight (lbs): 215 Respiratory Rate (breaths/min): 18 Body Mass Index (BMI): 36.9 Blood Pressure (mmHg): 117/74 Reference Range: 80 - 120 mg / dl Electronic Signature(s) Signed: 01/20/2015 4:54:35 PM By: Elliot Gurney, RN, BSN, Kim RN, BSN Entered By: Elliot Gurney, RN, BSN, Kim on 01/20/2015 09:08:56

## 2015-01-21 NOTE — Progress Notes (Signed)
ADLEE, PAAR ([id number]) Visit Report for 01/20/2015 Chief Complaint Document Details Patient Name: [name], KRAL Date of Service: 01/20/2015 9:00 AM Medical Record Patient Account Number: [id number] Number: Treating RN: Huel Coventry Apr 24, 1965 (49 y.o. Other Clinician: Date of Birth/Sex: Female) Treating BURNS III, Primary Care Physician: Yves Dill Physician/Extender: Zollie Beckers Referring Physician: Natale Lay Weeks in Treatment: 0 Information Obtained from: Patient Chief Complaint Right calf ulcer, status post I and D of abscess. Electronic Signature(s) Signed: 01/20/2015 4:13:26 PM By: Madelaine Bhat MD Entered By: Madelaine Bhat on 01/20/2015 13:34:15 [name] ([id number]) -------------------------------------------------------------------------------- Debridement Details Patient Name: [name] Date of Service: 01/20/2015 9:00 AM Medical Record Patient Account Number: [id number] Number: Treating RN: Huel Coventry [birth year] (49 y.o. Other Clinician: Date of Birth/Sex: Female) Treating BURNS III, Primary Care Physician: Yves Dill Physician/Extender: Zollie Beckers Referring Physician: Natale Lay Weeks in Treatment: 0 Debridement Performed for Wound #1 Right,Medial Lower Leg Assessment: Performed By: Physician BURNS III, Melanie Crazier., MD Debridement: Debridement Pre-procedure Yes Verification/Time Out Taken: Start Time: 09:39 Pain Control: Other : lidocaine 4% Level: Skin/Subcutaneous Tissue Total Area Debrided (L x 2.5 (cm) x 3 (cm) = 7.5 (cm) W): Tissue and other Viable, Non-Viable, Exudate, Fat, Fibrin/Slough, Subcutaneous material debrided: Instrument: Curette Bleeding: Minimum Hemostasis Achieved: Pressure End Time: 09:40 Procedural Pain: 0 Post Procedural Pain: 0 Response to Treatment: Procedure was tolerated well Post Debridement Measurements of Total Wound Length: (cm) 2.5 Width: (cm) 3 Depth: (cm) 0.2 Volume:  (cm) 1.178 Post Procedure Diagnosis Same as Pre-procedure Electronic Signature(s) Signed: 01/20/2015 4:13:26 PM By: Madelaine Bhat MD Signed: 01/20/2015 4:54:35 PM By: Elliot Gurney RN, BSN, Kim RN, BSN Entered By: Madelaine Bhat on 01/20/2015 13:33:56 [name] ([id number]) -------------------------------------------------------------------------------- HPI Details Patient Name: [name] Date of Service: 01/20/2015 9:00 AM Medical Record Patient Account Number: [id number] Number: Treating RN: Huel Coventry 31-Mar-1966 (49 y.o. Other Clinician: Date of Birth/Sex: Female) Treating BURNS III, Primary Care Physician: Yves Dill Physician/Extender: Zollie Beckers Referring Physician: Natale Lay Weeks in Treatment: 0 History of Present Illness HPI Description: Pleasant 49 year old with history of borderline personality disorder and CP. No history of diabetes or peripheral arterial disease. Right ABI 1.2. She says that she developed an ulceration on her right anterior calf secondary to an insect bite. This progressed to an abscess, and she underwent I and D on 01/02/2015 by Dr. Michela Pitcher. Cultures grew oxacillin sensitive staph aureus. She completed a course of by mouth antibiotics. She lives in a group home. Unknown dressing changes. She says that she is progressively improving. No significant pain. No fever or chills. Moderate drainage. Electronic Signature(s) Signed: 01/20/2015 4:13:26 PM By: Madelaine Bhat MD Entered By: Madelaine Bhat on 01/20/2015 13:36:06 [name] ([id number]) -------------------------------------------------------------------------------- Physical Exam Details Patient Name: [name] Date of Service: 01/20/2015 9:00 AM Medical Record Patient Account Number: [id number] Number: Treating RN: Huel Coventry April 15, 1966 (49 y.o. Other Clinician: Date of Birth/Sex: Female) Treating BURNS III, Primary Care Physician: Yves Dill Physician/Extender: Zollie Beckers Referring Physician: Natale Lay Weeks in Treatment: 0 Constitutional . Pulse regular. Respirations normal and unlabored. Afebrile. Marland Kitchen Respiratory WNL. No retractions.. Cardiovascular Pedal Pulses WNL. Integumentary (Hair, Skin) .Marland Kitchen Neurological Sensation normal to touch, pin,and vibration. Psychiatric Judgement and insight Intact.. Oriented times 3.. . Notes Right anterior calf ulceration. Full-thickness. Debrided to healthy underlying granulation tissue and subcutaneous fat. No cellulitis. No residual abscess. Minimal edema. Palpable DP. Right ABI 1.2. Electronic Signature(s) Signed: 01/20/2015 4:13:26 PM By: Madelaine Bhat MD Entered By: Reino Bellis,  Walter on 01/20/2015 13:36:53 DONELLA, PASCARELLA ([id number]) -------------------------------------------------------------------------------- Physician Orders Details Patient Name: LEILANEE, RIGHETTI Date of Service: 01/20/2015 9:00 AM Medical Record Patient Account Number: [id number] Number: Treating RN: Huel Coventry 10/18/1965 (49 y.o. Other Clinician: Date of Birth/Sex: Female) Treating BURNS III, Primary Care Physician: Yves Dill Physician/Extender: Zollie Beckers Referring Physician: Tharon Aquas in Treatment: 0 Verbal / Phone Orders: Yes Clinician: Huel Coventry Read Back and Verified: Yes Diagnosis Coding Wound Cleansing Wound #1 Right,Medial Lower Leg o Clean wound with Normal Saline. o May Shower, gently pat wound dry prior to applying new dressing. Anesthetic Wound #1 Right,Medial Lower Leg o Topical Lidocaine 4% cream applied to wound bed prior to debridement Skin Barriers/Peri-Wound Care Wound #1 Right,Medial Lower Leg o Barrier cream - on peri-wound Primary Wound Dressing Wound #1 Right,Medial Lower Leg o Aquacel Ag Secondary Dressing Wound #1 Right,Medial Lower Leg o ABD and Kerlix/Conform Dressing Change Frequency Wound #1 Right,Medial Lower Leg o  Change dressing every other day. - Three times weekly Follow-up Appointments Wound #1 Right,Medial Lower Leg o Return Appointment in 1 week. Edema Control Wound #1 Right,Medial Lower Leg o Tubigrip Taves, Cianni ([id number]) Additional Orders / Instructions Wound #1 Right,Medial Lower Leg o Increase protein intake. o Activity as tolerated Home Health Wound #1 Right,Medial Lower Leg o [facility] Vis[facility] - [facility] o Home Health Nurse may visit PRN to address patientos wound care needs. o FACE TO FACE ENCOUNTER: MEDICARE and MEDICAID PATIENTS: I certify that this patient is under my care and that I had a face-to-face encounter that meets the physician face-to-face encounter requirements with this patient on this date. The encounter with the patient was in whole or in part for the following MEDICAL CONDITION: (primary reason for Home Healthcare) MEDICAL NECESSITY: I certify, that based on my findings, NURSING services are a medically necessary home health service. HOME BOUND STATUS: I certify that my clinical findings support that this patient is homebound (i.e., Due to illness or injury, pt requires aid of supportive devices such as crutches, cane, wheelchairs, walkers, the use of special transportation or the assistance of another person to leave their place of residence. There is a normal inability to leave the home and doing so requires considerable and taxing effort. Other absences are for medical reasons / religious services and are infrequent or of short duration when for other reasons). o If current dressing causes regression in wound condition, may D/C ordered dressing product/s and apply Normal Saline Moist Dressing daily until next Wound Healing Center / Other MD appointment. Notify Wound Healing Center of regression in wound condition at [phone]. o Please direct any NON-WOUND related issues/requests for orders to patient's Primary  Care Physician Electronic Signature(s) Signed: 01/20/2015 4:13:26 PM By: Madelaine Bhat MD Signed: 01/20/2015 4:54:35 PM By: Elliot Gurney RN, BSN, Kim RN, BSN Entered By: Elliot Gurney, RN, BSN, Kim on 01/20/2015 10:01:02 [name] ([id number]) -------------------------------------------------------------------------------- Problem List Details Patient Name: [name] Date of Service: 01/20/2015 9:00 AM Medical Record Patient Account Number: [id number] Number: Treating RN: Huel Coventry 06-03-1965 (49 y.o. 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/20/2015 Yes exposed S81.801A Unspecified open wound, right lower leg, initial encounter 01/20/2015 Yes F60.3 Borderline personality disorder 01/20/2015 Yes Inactive Problems Resolved Problems Electronic Signature(s) Signed: 01/20/2015 4:13:26 PM By:  Madelaine Bhat MD Entered By: Madelaine Bhat on 01/20/2015 13:33:38 [name] ([id number]) -------------------------------------------------------------------------------- Progress Note/History and Physical Details Patient Name: [name] Date of Service: 01/20/2015 9:00 AM Medical Record Patient Account Number: [id number] Number: Treating RN: Huel Coventry June 26, 1965 (49 y.o. Other Clinician: Date of Birth/Sex: Female) Treating BURNS III, Primary Care Physician: Yves Dill Physician/Extender: Zollie Beckers Referring Physician: Natale Lay Weeks in Treatment: 0 Subjective Chief Complaint Information obtained from Patient Right calf ulcer, status post I and D of abscess. History of Present Illness (HPI) Pleasant 49 year old with history of borderline personality disorder and  CP. No history of diabetes or peripheral arterial disease. Right ABI 1.2. She says that she developed an ulceration on her right anterior calf secondary to an insect bite. This progressed to an abscess, and she underwent I and D on 01/02/2015 by Dr. Michela Pitcher. Cultures grew oxacillin sensitive staph aureus. She completed a course of by mouth antibiotics. She lives in a group home. Unknown dressing changes. She says that she is progressively improving. No significant pain. No fever or chills. Moderate drainage. Wound History Patient presents with 1 open wound that has been present for approximately 1 month. Patient has been treating wound in the following manner: Ca Ag. Laboratory tests have been performed in the last month. Patient reportedly has not tested positive for an antibiotic resistant organism. Patient reportedly has not tested positive for osteomyelitis. Patient reportedly has not had testing performed to evaluate circulation in the legs. Patient experiences the following problems associated with their wounds: infection, swelling. Patient History Information obtained from Patient, Chart. Allergies penicillin Social History Never smoker, Marital Status - Single, Alcohol Use - Never, Drug Use - No History, Caffeine Use - Never. Medical History Eyes Denies history of Cataracts, Glaucoma, Optic Neuritis Ear/Nose/Mouth/Throat Denies history of Chronic sinus problems/congestion, Middle ear problems TANIQUA, ISSA ([id number]) Hematologic/Lymphatic Denies history of Anemia, Hemophilia, Human Immunodeficiency Virus, Lymphedema, Sickle Cell Disease Respiratory Denies history of Aspiration, Asthma, Chronic Obstructive Pulmonary Disease (COPD), Pneumothorax, Sleep Apnea, Tuberculosis Cardiovascular Denies history of Angina, Arrhythmia, Congestive Heart Failure, Coronary Artery Disease, Deep Vein Thrombosis, Hypertension, Hypotension, Myocardial Infarction, Peripheral Arterial Disease,  Peripheral Venous Disease, Phlebitis, Vasculitis Gastrointestinal Denies history of Cirrhosis , Colitis, Crohn s, Hepatitis A, Hepatitis B, Hepatitis C Endocrine Denies history of Type I Diabetes, Type II Diabetes Genitourinary Denies history of End Stage Renal Disease Immunological Denies history of Lupus Erythematosus, Raynaud s, Scleroderma Integumentary (Skin) Denies history of History of Burn, History of pressure wounds Musculoskeletal Denies history of Gout, Rheumatoid Arthritis, Osteoarthritis, Osteomyelitis Neurologic Denies history of Dementia, Neuropathy, Quadriplegia, Paraplegia, Seizure Disorder Oncologic Denies history of Received Chemotherapy, Received Radiation Psychiatric Denies history of Anorexia/bulimia Hospitalization/Surgery History - [facility], cellulitis. Medical And Surgical History Notes Constitutional Symptoms (General Health) Asthma, anxiety, GERD, CP, Borderline personality disorder, Cholecystectomy, tonsilectomy, Review of Systems (ROS) Constitutional Symptoms (General Health) The patient has no complaints or symptoms. Eyes The patient has no complaints or symptoms. Ear/Nose/Mouth/Throat The patient has no complaints or symptoms. Hematologic/Lymphatic The patient has no complaints or symptoms. Respiratory The patient has no complaints or symptoms. Gastrointestinal The patient has no complaints or symptoms. Endocrine The patient has no complaints or symptoms. Genitourinary The patient has no complaints or symptoms. ULLA, MCKIERNAN ([id number]) Immunological The patient has no complaints or symptoms. Integumentary (Skin) Complains or has symptoms of Wounds, Bleeding or bruising tendency. Denies complaints or symptoms of Breakdown, Swelling. Musculoskeletal The patient has no complaints or symptoms. Neurologic The patient has no complaints or symptoms.  Oncologic The patient has no complaints or symptoms. Psychiatric Complains or has symptoms  of Anxiety. Objective Constitutional Pulse regular. Respirations normal and unlabored. Afebrile. Vitals Time Taken: 9:08 AM, Height: 64 in, Weight: 215 lbs, BMI: 36.9, Temperature: 98.8 F, Pulse: 75 bpm, Respiratory Rate: 18 breaths/min, Blood Pressure: 117/74 mmHg. Respiratory WNL. No retractions.. Cardiovascular Pedal Pulses WNL. Neurological Sensation normal to touch, pin,and vibration. Psychiatric Judgement and insight Intact.. Oriented times 3.. General Notes: Right anterior calf ulceration. Full-thickness. Debrided to healthy underlying granulation tissue and subcutaneous fat. No cellulitis. No residual abscess. Minimal edema. Palpable DP. Right ABI 1.2. Integumentary (Hair, Skin) Wound #1 status is Open. Original cause of wound was Bite. The wound is located on the Right,Medial Lower Leg. The wound measures 2.5cm length x 3cm width x 0.1cm depth; 5.89cm^2 area and 0.589cm^3 Swayne, Wyolene ([id number]) volume. The wound is limited to skin breakdown. There is a medium amount of purulent drainage noted. The wound margin is flat and intact. The periwound skin appearance exhibited: Scarring, Ecchymosis, Erythema. The periwound skin appearance did not exhibit: Callus, Crepitus, Excoriation, Fluctuance, Friable, Induration, Localized Edema, Rash, Dry/Scaly, Maceration, Moist, Atrophie Blanche, Cyanosis, Hemosiderin Staining, Mottled, Pallor, Rubor. The surrounding wound skin color is noted with erythema. Assessment Active Problems ICD-10 L97.212 - Non-pressure chronic ulcer of right calf with fat layer exposed S81.801A - Unspecified open wound, right lower leg, initial encounter F60.3 - Borderline personality disorder Right anterior calf ulcer, status post I and D of abscess. Procedures Wound #1 Wound #1 is an Infection - not elsewhere classified located on the Right,Medial Lower Leg . There was a Skin/Subcutaneous Tissue Debridement ([id number]) debridement with total area  of 7.5 sq cm performed by BURNS III, Melanie Crazier., MD. with the following instrument(s): Curette to remove Viable and Non-Viable tissue/material including Exudate, Fat, Fibrin/Slough, and Subcutaneous after achieving pain control using Other (lidocaine 4%). A time out was conducted prior to the start of the procedure. A Minimum amount of bleeding was controlled with Pressure. The procedure was tolerated well with a pain level of 0 throughout and a pain level of 0 following the procedure. Post Debridement Measurements: 2.5cm length x 3cm width x 0.2cm depth; 1.178cm^3 volume. Post procedure Diagnosis Wound #1: Same as Pre-Procedure Plan Wound Cleansing: Wound #1 Right,Medial Lower Leg: Clean wound with Normal Saline. ENAYA, HOWZE ([id number]) May Shower, gently pat wound dry prior to applying new dressing. Anesthetic: Wound #1 Right,Medial Lower Leg: Topical Lidocaine 4% cream applied to wound bed prior to debridement Skin Barriers/Peri-Wound Care: Wound #1 Right,Medial Lower Leg: Barrier cream - on peri-wound Primary Wound Dressing: Wound #1 Right,Medial Lower Leg: Aquacel Ag Secondary Dressing: Wound #1 Right,Medial Lower Leg: ABD and Kerlix/Conform Dressing Change Frequency: Wound #1 Right,Medial Lower Leg: Change dressing every other day. - Three times weekly Follow-up Appointments: Wound #1 Right,Medial Lower Leg: Return Appointment in 1 week. Edema Control: Wound #1 Right,Medial Lower Leg: Tubigrip Additional Orders / Instructions: Wound #1 Right,Medial Lower Leg: Increase protein intake. Activity as tolerated Home Health: Wound #1 Right,Medial Lower Leg: [facility] Visits - [facility] Home Health Nurse may visit PRN to address patient s wound care needs. FACE TO FACE ENCOUNTER: MEDICARE and MEDICAID PATIENTS: I certify that this patient is under my care and that I had a face-to-face encounter that meets the physician face-to-face encounter requirements  with this patient on this date. The encounter with the patient was in whole or in part for the following MEDICAL CONDITION: (primary reason for Home Healthcare) MEDICAL  NECESSITY: I certify, that based on my findings, NURSING services are a medically necessary home health service. HOME BOUND STATUS: I certify that my clinical findings support that this patient is homebound (i.e., Due to illness or injury, pt requires aid of supportive devices such as crutches, cane, wheelchairs, walkers, the use of special transportation or the assistance of another person to leave their place of residence. There is a normal inability to leave the home and doing so requires considerable and taxing effort. Other absences are for medical reasons / religious services and are infrequent or of short duration when for other reasons). If current dressing causes regression in wound condition, may D/C ordered dressing product/s and apply Normal Saline Moist Dressing daily until next Wound Healing Center / Other MD appointment. Notify Wound Healing Center of regression in wound condition at [phone]. Please direct any NON-WOUND related issues/requests for orders to patient's Primary Care Physician KITT, MINARDI ([id number]) Silver alginate dressing changes. Tubigrip for edema control. Electronic Signature(s) Signed: 01/20/2015 4:13:26 PM By: Madelaine Bhat MD Entered By: Madelaine Bhat on 01/20/2015 13:39:08 [name] ([id number]) -------------------------------------------------------------------------------- ROS/PFSH Details Patient Name: [name] Date of Service: 01/20/2015 9:00 AM Medical Record Patient Account Number: [id number] Number: Treating RN: Huel Coventry [birth year] (49 y.o. Other Clinician: Date of Birth/Sex: Female) Treating BURNS III, Primary Care Physician: Yves Dill Physician/Extender: Zollie Beckers Referring Physician: Natale Lay Weeks in Treatment: 0 Label  Progress Note Print Version as History and Physical for this encounter Information Obtained From Patient Chart Wound History Do you currently have one or more open woundso Yes How many open wounds do you currently haveo 1 Approximately how long have you had your woundso 1 month How have you been treating your wound(s) until nowo Ca Ag Has your wound(s) ever healed and then re-openedo No Have you had any lab work done in the past montho Yes Who ordered the lab work doneo [facility] Have you tested positive for an antibiotic resistant organism (MRSA, VRE)o No Have you tested positive for osteomyelitis (bone infection)o No Have you had any tests for circulation on your legso No Have you had other problems associated with your woundso Infection, Swelling Integumentary (Skin) Complaints and Symptoms: Positive for: Wounds; Bleeding or bruising tendency Negative for: Breakdown; Swelling Medical History: Negative for: History of Burn; History of pressure wounds Psychiatric Complaints and Symptoms: Positive for: Anxiety Medical History: Negative for: Anorexia/bulimia Constitutional Symptoms (General Health) Complaints and Symptoms: No Complaints or Symptoms Medical History: Past Medical History NotesKLEO, DUNGEE ([id number]) Asthma, anxiety, GERD, CP, Borderline personality disorder, Cholecystectomy, tonsilectomy, Eyes Complaints and Symptoms: No Complaints or Symptoms Medical History: Negative for: Cataracts; Glaucoma; Optic Neuritis Ear/Nose/Mouth/Throat Complaints and Symptoms: No Complaints or Symptoms Medical History: Negative for: Chronic sinus problems/congestion; Middle ear problems Hematologic/Lymphatic Complaints and Symptoms: No Complaints or Symptoms Medical History: Negative for: Anemia; Hemophilia; Human Immunodeficiency Virus; Lymphedema; Sickle Cell Disease Respiratory Complaints and Symptoms: No Complaints or Symptoms Medical History: Negative for:  Aspiration; Asthma; Chronic Obstructive Pulmonary Disease (COPD); Pneumothorax; Sleep Apnea; Tuberculosis Cardiovascular Medical History: Negative for: Angina; Arrhythmia; Congestive Heart Failure; Coronary Artery Disease; Deep Vein Thrombosis; Hypertension; Hypotension; Myocardial Infarction; Peripheral Arterial Disease; Peripheral Venous Disease; Phlebitis; Vasculitis Gastrointestinal Complaints and Symptoms: No Complaints or Symptoms Medical History: Negative for: Cirrhosis ; Colitis; Crohnos; Hepatitis A; Hepatitis B; Hepatitis C Endocrine LATTIE, RIEGE ([id number]) Complaints and Symptoms: No Complaints or Symptoms Medical History: Negative for: Type I Diabetes; Type II Diabetes Genitourinary Complaints and Symptoms: No Complaints or Symptoms Medical  History: Negative for: End Stage Renal Disease Immunological Complaints and Symptoms: No Complaints or Symptoms Medical History: Negative for: Lupus Erythematosus; Raynaudos; Scleroderma Musculoskeletal Complaints and Symptoms: No Complaints or Symptoms Medical History: Negative for: Gout; Rheumatoid Arthritis; Osteoarthritis; Osteomyelitis Neurologic Complaints and Symptoms: No Complaints or Symptoms Medical History: Negative for: Dementia; Neuropathy; Quadriplegia; Paraplegia; Seizure Disorder Oncologic Complaints and Symptoms: No Complaints or Symptoms Medical History: Negative for: Received Chemotherapy; Received Radiation Hospitalization / Surgery History Name of Hospital Purpose of Hospitalization/Surgery Date [facility] cellulitis Family and Social History ARTESIA, BERKEY ([id number]) Never smoker; Marital Status - Single; Alcohol Use: Never; Drug Use: No History; Caffeine Use: Never Physician Affirmation I have reviewed and agree with the above information. Electronic Signature(s) Signed: 01/20/2015 4:13:26 PM By: Madelaine Bhat MD Signed: 01/20/2015 4:54:35 PM By: Elliot Gurney RN, BSN, Kim RN, BSN Entered By:  Madelaine Bhat on 01/20/2015 13:38:28 [name] ([id number]) -------------------------------------------------------------------------------- SuperBill Details Patient Name: [name] Date of Service: 01/20/2015 Medical Record Patient Account Number: [id number] Number: Treating RN: Huel Coventry Jun 07, 1965 (49 y.o. Other Clinician: Date of Birth/Sex: Female) Treating BURNS III, Primary Care Physician: Yves Dill Physician/Extender: Zollie Beckers Referring Physician: Natale Lay Weeks in Treatment: 0 Diagnosis Coding ICD-10 Codes Code Description [phone] Non-pressure chronic ulcer of right calf with fat layer exposed S81.801A Unspecified open wound, right lower leg, initial encounter F60.3 Borderline personality disorder Facility Procedures CPT4 Code: [id number] Description: 99213 - WOUND CARE VISIT-LEV 3 EST PT Modifier: Quantity: 1 CPT4 Code: [id number] Description: 11042 - DEB SUBQ TISSUE 20 SQ CM/< ICD-10 Description Diagnosis L97.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 with fat S81.801A Unspecified open wound, right lower leg, initial e Modifier: layer exposed ncounter Quantity: 1 Physician Procedures CPT4 Code: 3244010 Description: WC PHYS LEVEL 3 o NEW PT ICD-10 Description Diagnosis 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

## 2015-01-21 NOTE — Progress Notes (Signed)
[name], [name] ([id number]) Visit Report for 01/20/2015 Abuse/Suicide Risk Screen Details Patient Name: [name], [name] Date of Service: 01/20/2015 9:00 AM Medical Record Patient Account Number: [id number] Number: Treating RN: Huel Coventry 28-Nov-1965 (49 y.o. Other Clinician: Date of Birth/Sex: Female) Treating BURNS III, Primary Care Physician: Yves Dill Physician/Extender: Zollie Beckers Referring Physician: Natale Lay Weeks in Treatment: 0 Abuse/Suicide Risk Screen Items Answer ABUSE/SUICIDE RISK SCREEN: Has anyone close to you tried to hurt or harm you recentlyo No Do you feel uncomfortable with anyone in your familyo No Has anyone forced you do things that you didnot want to doo No Do you have any thoughts of harming yourselfo No Patient displays signs or symptoms of abuse and/or neglect. No Electronic Signature(s) Signed: 01/20/2015 4:54:35 PM By: Elliot Gurney, RN, BSN, Kim RN, BSN Entered By: Elliot Gurney, RN, BSN, Kim on 01/20/2015 09:27:29 [name] ([id number]) -------------------------------------------------------------------------------- Activities of Daily Living Details Patient Name: [name] Date of Service: 01/20/2015 9:00 AM Medical Record Patient Account Number: [id number] Number: Treating RN: Huel Coventry 11/11/1965 (49 y.o. Other Clinician: Date of Birth/Sex: Female) Treating BURNS III, Primary Care Physician: Yves Dill Physician/Extender: Zollie Beckers Referring Physician: Natale Lay Weeks in Treatment: 0 Activities of Daily Living Items Answer Activities of Daily Living (Please select one for each item) Drive Automobile Not Able Take Medications Need Assistance Use Telephone Completely Able Care for Appearance Completely Able Use Toilet Completely Able Bath / Shower Completely Able Dress Self Completely Able Feed Self Completely Able Walk Completely Able Get In / Out Bed Completely Able Housework Completely Able Prepare Meals  Completely Able Handle Money Need Assistance Shop for Self Need Assistance Electronic Signature(s) Signed: 01/20/2015 4:54:35 PM By: Elliot Gurney, RN, BSN, Kim RN, BSN Entered By: Elliot Gurney, RN, BSN, Kim on 01/20/2015 09:27:59 [name] ([id number]) -------------------------------------------------------------------------------- Education Assessment Details Patient Name: [name] Date of Service: 01/20/2015 9:00 AM Medical Record Patient Account Number: [id number] Number: Treating RN: Huel Coventry 04/10/1966 (49 y.o. Other Clinician: Date of Birth/Sex: Female) Treating BURNS III, Primary Care Physician: Yves Dill Physician/Extender: Zollie Beckers Referring Physician: Tharon Aquas in Treatment: 0 Learning Preferences/Education Level/Primary Language Learning Preference: Explanation, Demonstration Preferred Language: English Cognitive Barrier Assessment/Beliefs Language Barrier: No Translator Needed: No Memory Deficit: No Emotional Barrier: Yes borderline persobnality disorder Knowledge/Comprehension Assessment Knowledge Level: Medium Comprehension Level: Medium Ability to understand written Medium instructions: Ability to understand verbal Medium instructions: Motivation Assessment Anxiety Level: Calm Cooperation: Cooperative Education Importance: Acknowledges Need Interest in Health Problems: Asks Questions Perception: Coherent Willingness to Engage in Self- High Management Activities: Readiness to Engage in Self- High Management Activities: Electronic Signature(s) Signed: 01/20/2015 4:54:35 PM By: Elliot Gurney, RN, BSN, Kim RN, BSN Entered By: Elliot Gurney, RN, BSN, Kim on 01/20/2015 09:28:45 [name] ([id number]) -------------------------------------------------------------------------------- Fall Risk Assessment Details Patient Name: [name] Date of Service: 01/20/2015 9:00 AM Medical Record Patient Account Number:  [id number] Number: Treating RN: Huel Coventry 1965-10-25 (49 y.o. Other Clinician: Date of Birth/Sex: Female) Treating BURNS III, Primary Care Physician: Yves Dill Physician/Extender: Zollie Beckers Referring Physician: Natale Lay Weeks in Treatment: 0 Fall Risk Assessment Items FALL RISK ASSESSMENT: History of falling - immediate or within 3 months 0 No Secondary diagnosis 0 No Ambulatory aid None/bed rest/wheelchair/nurse 0 Yes Crutches/cane/walker 0 No Furniture 0 No IV Access/Saline Lock 0 No Gait/Training Normal/bed rest/immobile 0 Yes Weak 0 No Impaired 0 No Mental Status Oriented to own ability 0 Yes Electronic Signature(s) Signed: 01/20/2015 4:54:35 PM By: Elliot Gurney, RN, BSN, Kim RN, BSN Entered By: Elliot Gurney, RN, BSN, Kim on  01/20/2015 09:29:08 [name], [name] ([id number]) -------------------------------------------------------------------------------- Foot Assessment Details Patient Name: [name], [name] Date of Service: 01/20/2015 9:00 AM Medical Record Patient Account Number: [id number] Number: Treating RN: Huel Coventry [birth year] (49 y.o. Other Clinician: Date of Birth/Sex: Female) Treating BURNS III, Primary Care Physician: Yves Dill Physician/Extender: Zollie Beckers Referring Physician: Natale Lay Weeks in Treatment: 0 Foot Assessment Items Site Locations + = Sensation present, - = Sensation absent, C = Callus, U = Ulcer R = Redness, W = Warmth, M = Maceration, PU = Pre-ulcerative lesion F = Fissure, S = Swelling, D = Dryness Assessment Right: Left: Other Deformity: No No Prior Foot Ulcer: No No Prior Amputation: No No Charcot Joint: No No Ambulatory Status: Ambulatory Without Help Gait: Steady Electronic Signature(s) Signed: 01/20/2015 4:54:35 PM By: Elliot Gurney, RN, BSN, Kim RN, BSN Entered By: Elliot Gurney, RN, BSN, Kim on 01/20/2015 09:29:36 [name]  ([id number]) -------------------------------------------------------------------------------- Nutrition Risk Assessment Details Patient Name: [name] Date of Service: 01/20/2015 9:00 AM Medical Record Patient Account Number: [id number] Number: Treating RN: Huel Coventry 07-20-1965 (49 y.o. Other Clinician: Date of Birth/Sex: Female) Treating BURNS III, Primary Care Physician: Yves Dill Physician/Extender: Zollie Beckers Referring Physician: Natale Lay Weeks in Treatment: 0 Height (in): 64 Weight (lbs): 215 Body Mass Index (BMI): 36.9 Nutrition Risk Assessment Items NUTRITION RISK SCREEN: I have an illness or condition that made me change the kind and/or 0 No amount of food I eat I eat fewer than two meals per day 0 No I eat few fruits and vegetables, or milk products 0 No I have three or more drinks of beer, liquor or wine almost every day 0 No I have tooth or mouth problems that make it hard for me to eat 0 No I don't always have enough money to buy the food I need 0 No I eat alone most of the time 0 No I take three or more different prescribed or over-the-counter drugs a 0 No day Without wanting to, I have lost or gained 10 pounds in the last six 0 No months I am not always physically able to shop, cook and/or feed myself 0 No Nutrition Protocols Good Risk Protocol 0 No interventions needed Moderate Risk Protocol Electronic Signature(s) Signed: 01/20/2015 4:54:35 PM By: Elliot Gurney, RN, BSN, Kim RN, BSN Entered By: Elliot Gurney, RN, BSN, Kim on 01/20/2015 09:29:13

## 2015-01-27 ENCOUNTER — Encounter: Payer: Medicare Other | Admitting: Surgery

## 2015-01-27 DIAGNOSIS — L97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 with fat layer exposed: Secondary | ICD-10-CM | POA: Diagnosis not present

## 2015-01-27 NOTE — Progress Notes (Addendum)
[name] ([id number]) Visit Report for 01/27/2015 Arrival Information Details Patient Name: [name], [name] Date of Service: 01/27/2015 11:30 AM Medical Record Number: [id number] Patient Account Number: [id number] Date of Birth/Sex: [birth year] (48 y.o. Female) Treating RN: Clover Mealy, RN, BSN, [place] Sink Primary Care Physician: Yves Dill Other Clinician: Referring Physician: Yves Dill Treating Physician/Extender: BURNS III, Regis Bill in Treatment: 1 Visit Information History Since Last Visit Any new allergies or adverse reactions: No Patient Arrived: Ambulatory Had a fall or experienced change in No Arrival Time: 11:24 activities of daily living that may affect Accompanied By: caregiver risk of falls: Transfer Assistance: None Signs or symptoms of abuse/neglect since last No Patient Identification Verified: Yes visito Secondary Verification Process Yes Hospitalized since last visit: No Completed: Has Dressing in Place as Prescribed: Yes Has Compression in Place as Prescribed: Yes Pain Present Now: No Electronic Signature(s) Signed: 01/27/2015 11:26:57 AM By: Elpidio Eric BSN, RN Entered By: Elpidio Eric on 01/27/2015 11:26:57 [name] ([id number]) -------------------------------------------------------------------------------- Encounter Discharge Information Details Patient Name: [name] Date of Service: 01/27/2015 11:30 AM Medical Record Number: [id number] Patient Account Number: [id number] Date of Birth/Sex: 06-27-1965 (49 y.o. Female) Treating RN: Clover Mealy, RN, BSN, Rita Primary Care Physician: Yves Dill Other Clinician: Referring Physician: Yves Dill Treating Physician/Extender: BURNS III, Regis Bill in Treatment: 1 Encounter Discharge Information Items Discharge Pain Level: 0 Discharge Condition: Stable Ambulatory Status: Ambulatory Discharge Destination: Nursing Home Transportation: Other Accompanied By: caregiver Schedule Follow-up  Appointment: No Medication Reconciliation completed and provided to Patient/Care No [name]: Provided on Clinical Summary of Care: 01/27/2015 Form Type Recipient Paper Patient [place] Electronic Signature(s) Signed: 01/27/2015 3:48:39 PM By: Elpidio Eric BSN, RN Previous Signature: 01/27/2015 11:45:02 AM Version By: Gwenlyn Perking Entered By: Elpidio Eric on 01/27/2015 15:48:39 [name] ([id number]) -------------------------------------------------------------------------------- Lower Extremity Assessment Details Patient Name: [name] Date of Service: 01/27/2015 11:30 AM Medical Record Number: [id number] Patient Account Number: [id number] Date of Birth/Sex: 1965/08/09 (49 y.o. Female) Treating RN: Clover Mealy, RN, BSN, [place] Sink Primary Care Physician: Yves Dill Other Clinician: Referring Physician: Yves Dill Treating Physician/Extender: BURNS III, Regis Bill in Treatment: 1 Vascular Assessment Pulses: Posterior Tibial Dorsalis Pedis Palpable: [Right:Yes] Extremity colors, hair growth, and conditions: Extremity Color: [Right:Mottled] Hair Growth on Extremity: [Right:Yes] Temperature of Extremity: [Right:Warm] Toe Nail Assessment Left: Right: Thick: No Discolored: No Deformed: No Improper Length and Hygiene: No Electronic Signature(s) Signed: 01/27/2015 11:27:48 AM By: Elpidio Eric BSN, RN Entered By: Elpidio Eric on 01/27/2015 11:27:47 [name] ([id number]) -------------------------------------------------------------------------------- Multi Wound Chart Details Patient Name: [name] Date of Service: 01/27/2015 11:30 AM Medical Record Number: [id number] Patient Account Number: [id number] Date of Birth/Sex: 1965/09/26 (49 y.o. Female) Treating RN: Clover Mealy, RN, BSN, Rita Primary Care Physician: Yves Dill Other Clinician: Referring Physician: Yves Dill Treating Physician/Extender: BURNS III, Regis Bill in Treatment: 1 Vital  Signs Height(in): 64 Pulse(bpm): 80 Weight(lbs): 215 Blood Pressure 132/105 (mmHg): Body Mass Index(BMI): 37 Temperature(F): 99.1 Respiratory Rate 18 (breaths/min): Photos: [1:No Photos] [N/A:N/A] Wound Location: [1:Right Lower Leg - Medial] [N/A:N/A] Wounding Event: [1:Bite] [N/A:N/A] Primary Etiology: [1:Infection - not elsewhere classified] [N/A:N/A] Date Acquired: [1:12/28/2014] [N/A:N/A] Weeks of Treatment: [1:1] [N/A:N/A] Wound Status: [1:Open] [N/A:N/A] Measurements L x W x D 2.5x2.5x0.1 [N/A:N/A] (cm) Area (cm) : [1:4.909] [N/A:N/A] Volume (cm) : [1:0.491] [N/A:N/A] % Reduction in Area: [1:16.70%] [N/A:N/A] % Reduction in Volume: 16.60% [N/A:N/A] Classification: [1:Full Thickness Without Exposed Support Structures] [N/A:N/A] Exudate Amount: [1:Medium] [N/A:N/A] Exudate Type: [1:Purulent] [N/A:N/A] Exudate Color: [1:yellow, brown, Green] [N/A:N/A] Wound Margin: [1:Flat and Intact] [N/A:N/A] Granulation Amount: [1:Large (  67-100%)] [N/A:N/A] Granulation Quality: [1:Red] [N/A:N/A] Necrotic Amount: [1:Small (1-33%)] [N/A:N/A] Exposed Structures: [1:Fascia: No Fat: No Tendon: No Muscle: No Joint: No Bone: No] [N/A:N/A] Limited to Skin Breakdown Epithelialization: Small (1-33%) N/A N/A Periwound Skin Texture: Edema: Yes N/A N/A Scarring: Yes Excoriation: No Induration: No Callus: No Crepitus: No Fluctuance: No Friable: No Rash: No Periwound Skin Moist: Yes N/A N/A Moisture: Maceration: No Dry/Scaly: No Periwound Skin Color: Atrophie Blanche: No N/A N/A Cyanosis: No Ecchymosis: No Erythema: No Hemosiderin Staining: No Mottled: No Pallor: No Rubor: No Temperature: No Abnormality N/A N/A Tenderness on Yes N/A N/A Palpation: Wound Preparation: Ulcer Cleansing: N/A N/A Rinsed/Irrigated with Saline Topical Anesthetic Applied: Other: lidocaine 4% Treatment Notes Electronic Signature(s) Signed: 01/27/2015 11:37:02 AM By: Elpidio EricAfful, Rita BSN, RN Entered  By: Elpidio EricAfful, Rita on 01/27/2015 11:37:01 [name] ([id number]) -------------------------------------------------------------------------------- Multi-Disciplinary Care Plan Details Patient Name: [name] Date of Service: 01/27/2015 11:30 AM Medical Record Number: [id number] Patient Account Number: [id number] Date of Birth/Sex: 25-Oct-1965 (49 y.o. Female) Treating RN: Clover MealyAfful, RN, BSN, [place] Sinkita Primary Care Physician: Yves DillKhan, Neelam Other Clinician: Referring Physician: Yves DillKhan, Neelam Treating Physician/Extender: BURNS III, Regis BillWALTER Weeks in Treatment: 1 Active Inactive Orientation to the Wound Care Program Nursing Diagnoses: Knowledge deficit related to the wound healing center program Goals: Patient/caregiver will verbalize understanding of the Wound Healing Center Program Date Initiated: 01/20/2015 Goal Status: Active Interventions: Provide education on orientation to the wound center Notes: Soft Tissue Infection Nursing Diagnoses: Potential for infection: soft tissue Goals: Patient will remain free of wound infection Date Initiated: 01/20/2015 Goal Status: Active Interventions: Assess signs and symptoms of infection every visit Treatment Activities: Culture and sensitivity : 01/27/2015 Notes: Wound/Skin Impairment Nursing Diagnoses: Impaired tissue integrity [name], Shanayah ([id number]) Goals: Ulcer/skin breakdown will heal within 14 weeks Date Initiated: 01/20/2015 Goal Status: Active Interventions: Assess patient/caregiver ability to obtain necessary supplies Notes: Electronic Signature(s) Signed: 01/27/2015 11:36:53 AM By: Elpidio EricAfful, Rita BSN, RN Entered By: Elpidio EricAfful, Rita on 01/27/2015 11:36:52 [name], Leauna ([id number]) -------------------------------------------------------------------------------- Pain Assessment Details Patient Name: [name] Date of Service: 01/27/2015 11:30 AM Medical Record Number: [id number] Patient Account Number:  [id number] Date of Birth/Sex: 25-Oct-1965 (49 y.o. Female) Treating RN: Clover MealyAfful, RN, BSN, [place] Sinkita Primary Care Physician: Yves DillKhan, Neelam Other Clinician: Referring Physician: Yves DillKhan, Neelam Treating Physician/Extender: BURNS III, Regis BillWALTER Weeks in Treatment: 1 Active Problems Location of Pain Severity and Description of Pain Patient Has Paino No Site Locations Pain Management and Medication Current Pain Management: Electronic Signature(s) Signed: 01/27/2015 11:27:03 AM By: Elpidio EricAfful, Rita BSN, RN Entered By: Elpidio EricAfful, Rita on 01/27/2015 11:27:03 [name] ([id number]) -------------------------------------------------------------------------------- Patient/Caregiver Education Details Patient Name: [name] Date of Service: 01/27/2015 11:30 AM Medical Record Number: [id number] Patient Account Number: [id number] Date of Birth/Gender: 25-Oct-1965 (49 y.o. Female) Treating RN: Clover MealyAfful, RN, BSN, [place] Sinkita Primary Care Physician: Yves DillKhan, Neelam Other Clinician: Referring Physician: Yves DillKhan, Neelam Treating Physician/Extender: BURNS III, Regis BillWALTER Weeks in Treatment: 1 Education Assessment Education Provided To: Patient Education Topics Provided Basic Hygiene: Methods: Explain/Verbal Responses: State content correctly Welcome To The [facility]: Methods: Explain/Verbal Responses: State content correctly Electronic Signature(s) Signed: 01/27/2015 3:49:00 PM By: Elpidio EricAfful, Rita BSN, RN Entered By: Elpidio EricAfful, Rita on 01/27/2015 15:49:00 [name] ([id number]) -------------------------------------------------------------------------------- Wound Assessment Details Patient Name: [name] Date of Service: 01/27/2015 11:30 AM Medical Record Number: [id number] Patient Account Number: [id number] Date of Birth/Sex: 25-Oct-1965 (49 y.o. Female) Treating RN: Clover MealyAfful, RN, BSN, Rita Primary Care Physician: Yves DillKhan, Neelam Other Clinician: Referring Physician: Yves DillKhan, Neelam Treating Physician/Extender:  BURNS III, Regis BillWALTER Weeks in Treatment: 1  Wound Status Wound Number: 1 Primary Etiology: Infection - not elsewhere classified Wound Location: Right Lower Leg - Medial Wound Status: Open Wounding Event: Bite Date Acquired: 12/28/2014 Weeks Of Treatment: 1 Clustered Wound: No Photos Photo Uploaded By: Elpidio Eric on 01/27/2015 16:37:09 Wound Measurements Length: (cm) 2.5 Width: (cm) 2.5 Depth: (cm) 0.1 Area: (cm) 4.909 Volume: (cm) 0.491 % Reduction in Area: 16.7% % Reduction in Volume: 16.6% Epithelialization: Small (1-33%) Tunneling: No Undermining: No Wound Description Full Thickness Without Exposed Foul Odor Af Classification: Support Structures Wound Margin: Flat and Intact Exudate Medium Amount: Exudate Type: Purulent Exudate Color: yellow, brown, Green ter Cleansing: No Wound Bed Granulation Amount: Large (67-100%) Exposed Structure Granulation Quality: Red Fascia Exposed: No Necrotic Amount: Small (1-33%) Fat Layer Exposed: No [name], [name] ([id number]) Necrotic Quality: Adherent Slough Tendon Exposed: No Muscle Exposed: No Joint Exposed: No Bone Exposed: No Limited to Skin Breakdown Periwound Skin Texture Texture Color No Abnormalities Noted: No No Abnormalities Noted: No Callus: No Atrophie Blanche: No Crepitus: No Cyanosis: No Excoriation: No Ecchymosis: No Fluctuance: No Erythema: No Friable: No Hemosiderin Staining: No Induration: No Mottled: No Localized Edema: Yes Pallor: No Rash: No Rubor: No Scarring: Yes Temperature / Pain Moisture Temperature: No Abnormality No Abnormalities Noted: No Tenderness on Palpation: Yes Dry / Scaly: No Maceration: No Moist: Yes Wound Preparation Ulcer Cleansing: Rinsed/Irrigated with Saline Topical Anesthetic Applied: Other: lidocaine 4%, Treatment Notes Wound #1 (Right, Medial Lower Leg) 1. Cleansed with: Clean wound with Normal Saline 4. Dressing Applied: Aquacel Ag 5. Secondary  Dressing Applied Gauze and Kerlix/Conform 7. Secured with Secretary/administrator) Signed: 01/27/2015 11:31:49 AM By: Elpidio Eric BSN, RN Entered By: Elpidio Eric on 01/27/2015 11:31:49 [name] ([id number]) -------------------------------------------------------------------------------- Vitals Details Patient Name: [name] Date of Service: 01/27/2015 11:30 AM Medical Record Number: [id number] Patient Account Number: [id number] Date of Birth/Sex: 1965-12-16 (49 y.o. Female) Treating RN: Afful, RN, BSN, Rita Primary Care Physician: Yves Dill Other Clinician: Referring Physician: Yves Dill Treating Physician/Extender: BURNS III, Regis Bill in Treatment: 1 Vital Signs Time Taken: 11:27 Temperature (F): 99.1 Height (in): 64 Pulse (bpm): 80 Weight (lbs): 215 Respiratory Rate (breaths/min): 18 Body Mass Index (BMI): 36.9 Blood Pressure (mmHg): 132/105 Reference Range: 80 - 120 mg / dl Electronic Signature(s) Signed: 01/27/2015 11:27:26 AM By: Elpidio Eric BSN, RN Entered By: Elpidio Eric on 01/27/2015 11:27:25

## 2015-01-28 NOTE — Progress Notes (Signed)
FANI, ROTONDO ([id number]) Visit Report for 01/27/2015 Chief Complaint Document Details Patient Name: [name], [name] 01/27/2015 11:30 Date of Service: AM Medical Record [id number] Number: Patient Account Number: [id number] Jan 18, 1966 (49 y.o. Treating RN: Clover Mealy, RN, BSN, [place] Sink Date of Birth/Sex: Female) Other Clinician: Primary Care Physician: Yves Dill Treating BURNS III, WALTER Referring Physician: Yves Dill Physician/Extender: Tania Ade in Treatment: 1 Information Obtained from: Patient Chief Complaint Right calf ulcer, status post I and D of abscess. Electronic Signature(s) Signed: 01/27/2015 3:35:09 PM By: Madelaine Bhat MD Entered By: Madelaine Bhat on 01/27/2015 13:03:22 [name] ([id number]) -------------------------------------------------------------------------------- Debridement Details Patient Name: [name] 01/27/2015 11:30 Date of Service: AM Medical Record [id number] Number: Patient Account Number: [id number] Nov 29, 1965 (49 y.o. Treating RN: Afful, RN, BSN, [place] Sink Date of Birth/Sex: Female) Other Clinician: Primary Care Physician: Yves Dill Treating BURNS III, WALTER Referring Physician: Yves Dill Physician/Extender: Tania Ade in Treatment: 1 Debridement Performed for Wound #1 Right,Medial Lower Leg Assessment: Performed By: Physician BURNS III, Melanie Crazier., MD Debridement: Debridement Pre-procedure Yes Verification/Time Out Taken: Start Time: 11:37 Pain Control: Lidocaine 4% Topical Solution Level: Skin/Subcutaneous Tissue Total Area Debrided (L x 2.5 (cm) x 2.5 (cm) = 6.25 (cm) W): Tissue and other Viable, Non-Viable, Exudate, Fat, Fibrin/Slough, Subcutaneous material debrided: Instrument: Curette Bleeding: Minimum Hemostasis Achieved: Pressure End Time: 11:40 Procedural Pain: 0 Post Procedural Pain: 0 Response to Treatment: Procedure was tolerated well Post Debridement Measurements of Total Wound Length: (cm)  2.5 Width: (cm) 2.5 Depth: (cm) 0.2 Volume: (cm) 0.982 Post Procedure Diagnosis Same as Pre-procedure Electronic Signature(s) Signed: 01/27/2015 3:35:09 PM By: Madelaine Bhat MD Signed: 01/27/2015 5:06:41 PM By: Elpidio Eric BSN, RN Previous Signature: 01/27/2015 11:40:37 AM Version By: Elpidio Eric BSN, RN Entered By: Madelaine Bhat on 01/27/2015 13:03:13 SUPRINA, MANDEVILLE ([id number]) CHERYLE, DARK ([id number]) -------------------------------------------------------------------------------- HPI Details Patient Name: [name] 01/27/2015 11:30 Date of Service: AM Medical Record [id number] Number: Patient Account Number: [id number] 08/11/1965 (49 y.o. Treating RN: Clover Mealy, RN, BSN, [place] Sink Date of Birth/Sex: Female) Other Clinician: Primary Care Physician: Yves Dill Treating BURNS III, WALTER Referring Physician: Yves Dill Physician/Extender: Tania Ade in Treatment: 1 History of Present Illness HPI Description: Pleasant 49 year old with history of borderline personality disorder and CP. No history of diabetes or peripheral arterial disease. Right ABI 1.2. She developed an ulceration on her right anterior calf secondary to an insect bite. This progressed to an abscess, and she underwent I and D on 01/02/2015 by Dr. Michela Pitcher. Cultures grew oxacillin sensitive staph aureus. She completed a course of po antibiotics. She lives in a group home. Performing dressing changes with silver alginate. She returns to clinic for follow-up and is without new complaints. No significant pain. No fever or chills. Minimal drainage. Electronic Signature(s) Signed: 01/27/2015 3:35:09 PM By: Madelaine Bhat MD Entered By: Madelaine Bhat on 01/27/2015 13:04:27 [name] ([id number]) -------------------------------------------------------------------------------- Physical Exam Details Patient Name: [name] 01/27/2015 11:30 Date of Service: AM Medical  Record [id number] Number: Patient Account Number: [id number] 29-Jun-1965 (49 y.o. Treating RN: Clover Mealy, RN, BSN, [place] Sink Date of Birth/Sex: Female) Other Clinician: Primary Care Physician: Yves Dill Treating BURNS III, WALTER Referring Physician: Yves Dill Physician/Extender: Tania Ade in Treatment: 1 Constitutional . Pulse regular. Respirations normal and unlabored. Afebrile. . Notes Right anterior calf ulceration. Full-thickness. Debrided to healthy underlying granulation tissue and subcutaneous fat. No cellulitis. No residual abscess. Minimal edema. Palpable DP. Right ABI 1.2. Electronic Signature(s) Signed: 01/27/2015 3:35:09 PM By: Madelaine Bhat MD Entered By: Madelaine Bhat on 01/27/2015 13:04:47 Blankenbaker,  [name] ([id number]) -------------------------------------------------------------------------------- Physician Orders Details Patient Name: [name] 01/27/2015 11:30 Date of Service: AM Medical Record [id number] Number: Patient Account Number: [id number] 1965-12-04 (49 y.o. Treating RN: Clover Mealy, RN, BSN, [place] Sink Date of Birth/Sex: Female) Other Clinician: Primary Care Physician: Yves Dill Treating BURNS III, WALTER Referring Physician: Yves Dill Physician/Extender: Tania Ade in Treatment: 1 Verbal / Phone Orders: Yes Clinician: Afful, RN, BSN, Rita Read Back and Verified: Yes Diagnosis Coding Wound Cleansing Wound #1 Right,Medial Lower Leg o Clean wound with Normal Saline. o May Shower, gently pat wound dry prior to applying new dressing. Anesthetic Wound #1 Right,Medial Lower Leg o Topical Lidocaine 4% cream applied to wound bed prior to debridement Skin Barriers/Peri-Wound Care Wound #1 Right,Medial Lower Leg o Barrier cream - on peri-wound Primary Wound Dressing Wound #1 Right,Medial Lower Leg o Aquacel Ag Secondary Dressing Wound #1 Right,Medial Lower Leg o ABD and Kerlix/Conform Dressing Change Frequency Wound #1 Right,Medial  Lower Leg o Change dressing every other day. - Three times weekly Follow-up Appointments Wound #1 Right,Medial Lower Leg o Return Appointment in 2 weeks. Edema Control Wound #1 Right,Medial Lower Leg o Tubigrip Alleva, Joscelyne ([id number]) Additional Orders / Instructions Wound #1 Right,Medial Lower Leg o Increase protein intake. o Activity as tolerated Home Health Wound #1 Right,Medial Lower Leg o Continue [facility] Visits - [facility] o Home Health Nurse may visit PRN to address patientos wound care needs. o FACE TO FACE ENCOUNTER: MEDICARE and MEDICAID PATIENTS: I certify that this patient is under my care and that I had a face-to-face encounter that meets the physician face-to-face encounter requirements with this patient on this date. The encounter with the patient was in whole or in part for the following MEDICAL CONDITION: (primary reason for Home Healthcare) MEDICAL NECESSITY: I certify, that based on my findings, NURSING services are a medically necessary home health service. HOME BOUND STATUS: I certify that my clinical findings support that this patient is homebound (i.e., Due to illness or injury, pt requires aid of supportive devices such as crutches, cane, wheelchairs, walkers, the use of special transportation or the assistance of another person to leave their place of residence. There is a normal inability to leave the home and doing so requires considerable and taxing effort. Other absences are for medical reasons / religious services and are infrequent or of short duration when for other reasons). o If current dressing causes regression in wound condition, may D/C ordered dressing product/s and apply Normal Saline Moist Dressing daily until next Wound Healing Center / Other MD appointment. Notify Wound Healing Center of regression in wound condition at [phone]. o Please direct any NON-WOUND related issues/requests for orders to patient's  Primary Care Physician Electronic Signature(s) Signed: 01/27/2015 11:43:07 AM By: Elpidio Eric BSN, RN Signed: 01/27/2015 3:35:09 PM By: Madelaine Bhat MD Previous Signature: 01/27/2015 11:40:58 AM Version By: Elpidio Eric BSN, RN Entered By: Elpidio Eric on 01/27/2015 11:43:06 [name] ([id number]) -------------------------------------------------------------------------------- Problem List Details Patient Name: LIVIANA, MILLS 01/27/2015 11:30 Date of Service: AM Medical Record [id number] Number: Patient Account Number: [id number] [birth year] (49 y.o. 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 Treating RN: Clover Mealy, RN, BSN, [place] Sink Date of Birth/Sex: Female) Other Clinician: Primary Care Physician: Yves Dill Treating BURNS III, WALTER Referring Physician: Yves Dill Physician/Extender: Tania Ade in Treatment: 1 Subjective Chief Complaint Information obtained from Patient Right calf ulcer, status post I and D of abscess. History of Present Illness  (HPI) Pleasant 49 year old with history of borderline personality disorder and CP. No history of diabetes or peripheral arterial disease. Right ABI 1.2. She developed an ulceration on her right anterior calf secondary to an insect bite. This progressed to an abscess, and she underwent I and D on 01/02/2015 by Dr. Michela Pitcher. Cultures grew oxacillin sensitive staph aureus. She completed a course of po antibiotics. She lives in a group home. Performing dressing changes with silver alginate. She returns to clinic for follow-up and is without new complaints. No significant pain. No fever or chills. Minimal drainage. Objective Constitutional Pulse regular. Respirations normal and unlabored. Afebrile. Vitals Time Taken: 11:27 AM, Height: 64 in, Weight: 215 lbs, BMI: 36.9, Temperature: 99.1 F, Pulse: 80 bpm, Respiratory Rate: 18 breaths/min, Blood Pressure: 132/105 mmHg. General Notes: Right anterior calf ulceration. Full-thickness. Debrided to healthy underlying granulation tissue and subcutaneous fat. No cellulitis. No residual abscess. Minimal edema. Palpable DP. Right ABI 1.2. Integumentary (Hair, Skin) Wound #1 status is Open. Original cause of wound was Bite. The wound is located on the [place] ([id number]) Lower Leg. The wound measures 2.5cm length x 2.5cm width x 0.1cm depth; 4.909cm^2 area and 0.491cm^3 volume. The wound is limited to skin breakdown. There is no tunneling or undermining noted. There is a medium amount of purulent drainage noted. The wound margin is flat and intact. There is large (67-100%) red granulation within the wound bed. There is a small (1-33%) amount of necrotic tissue within the wound bed including Adherent Slough. The periwound skin appearance exhibited: Localized Edema, Scarring, Moist. The periwound skin appearance did not exhibit: Callus, Crepitus, Excoriation, Fluctuance, Friable, Induration, Rash, Dry/Scaly, Maceration, Atrophie Blanche,  Cyanosis, Ecchymosis, Hemosiderin Staining, Mottled, Pallor, Rubor, Erythema. Periwound temperature was noted as No Abnormality. The periwound has tenderness on palpation. Assessment Active Problems ICD-10 L97.212 - Non-pressure chronic ulcer of right calf with fat layer exposed S81.801S - Unspecified open wound, right lower leg, sequela F60.3 - Borderline personality disorder Right anterior calf ulceration, status post I and D of abscess. Procedures Wound #1 Wound #1 is an Infection - not elsewhere classified located on the Right,Medial Lower Leg . There was a Skin/Subcutaneous Tissue Debridement ([id number]) debridement with total area of 6.25 sq cm performed by BURNS III, Melanie Crazier., MD. with the following instrument(s): Curette to remove Viable and Non-Viable tissue/material including Exudate, Fat, Fibrin/Slough, and Subcutaneous after achieving pain control using Lidocaine 4% Topical Solution. A time out was conducted prior to the start of the procedure. A Minimum amount of bleeding was controlled with Pressure. The procedure was tolerated well with a pain level of 0 throughout and a pain level of 0 following the procedure. Post Debridement Measurements: 2.5cm length x 2.5cm width x 0.2cm depth; 0.982cm^3 volume. Post procedure Diagnosis Wound #1: Same as Pre-Procedure Plan AIRA, SALLADE ([id number]) Wound Cleansing: Wound #1 Right,Medial Lower Leg: Clean wound with Normal Saline. May Shower, gently pat wound dry  prior to applying new dressing. Anesthetic: Wound #1 Right,Medial Lower Leg: Topical Lidocaine 4% cream applied to wound bed prior to debridement Skin Barriers/Peri-Wound Care: Wound #1 Right,Medial Lower Leg: Barrier cream - on peri-wound Primary Wound Dressing: Wound #1 Right,Medial Lower Leg: Aquacel Ag Secondary Dressing: Wound #1 Right,Medial Lower Leg: ABD and Kerlix/Conform Dressing Change Frequency: Wound #1 Right,Medial Lower Leg: Change dressing  every other day. - Three times weekly Follow-up Appointments: Wound #1 Right,Medial Lower Leg: Return Appointment in 2 weeks. Edema Control: Wound #1 Right,Medial Lower Leg: Tubigrip Additional Orders / Instructions: Wound #1 Right,Medial Lower Leg: Increase protein intake. Activity as tolerated Home Health: Wound #1 Right,Medial Lower Leg: [facility] Nurse may visit PRN to address patient s wound care needs. FACE TO FACE ENCOUNTER: MEDICARE and MEDICAID PATIENTS: I certify that this patient is under my care and that I had a face-to-face encounter that meets the physician face-to-face encounter requirements with this patient on this date. The encounter with the patient was in whole or in part for the following MEDICAL CONDITION: (primary reason for Home Healthcare) MEDICAL NECESSITY: I certify, that based on my findings, NURSING services are a medically necessary home health service. HOME BOUND STATUS: I certify that my clinical findings support that this patient is homebound (i.e., Due to illness or injury, pt requires aid of supportive devices such as crutches, cane, wheelchairs, walkers, the use of special transportation or the assistance of another person to leave their place of residence. There is a normal inability to leave the home and doing so requires considerable and taxing effort. Other absences are for medical reasons / religious services and are infrequent or of short duration when for other reasons). If current dressing causes regression in wound condition, may D/C ordered dressing product/s and apply Normal Saline Moist Dressing daily until next Wound Healing Center / Other MD appointment. Notify Wound Healing Center of regression in wound condition at [phone]. Please direct any NON-WOUND related issues/requests for orders to patient's Primary Care Physician Tempie DonningSHUMATE, Deepti ([id number]) silver alginate. Tubigrip for edema  control. Electronic Signature(s) Signed: 01/27/2015 3:35:09 PM By: Madelaine BhatBurns, III, Walter MD Entered By: Madelaine BhatBurns, III, Walter on 01/27/2015 13:05:20 Tempie DonningSHUMATE, Vernida ([id number]) -------------------------------------------------------------------------------- SuperBill Details Patient Name: Tempie DonningSHUMATE, Zakayla Date of Service: 01/27/2015 Medical Record Patient Account Number: [id number] Number: Treating RN: Clover MealyAfful, RN, BSN, Rita 01-09-1966 (49 y.o. Other Clinician: Date of Birth/Sex: Female) Treating BURNS III, Primary Care Physician: Yves DillKhan, Neelam Physician/Extender: Zollie BeckersWALTER Referring Physician: Yves DillKhan, Neelam Weeks in Treatment: 1 Diagnosis Coding ICD-10 Codes Code Description 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 with fat layer exposed S81.801S Unspecified open wound, right lower leg, sequela F60.3 Borderline personality disorder Facility Procedures CPT4 Code: [id number] Description: 11042 - DEB SUBQ TISSUE 20 SQ CM/< ICD-10 Description Diagnosis L97.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 with fat Modifier: layer exposed Quantity: 1 Physician Procedures CPT4 Code: [id number] Description: 11042 - WC PHYS SUBQ TISS 20 SQ CM ICD-10 Description Diagnosis L97.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 with fat Modifier: layer exposed Quantity: 1 Electronic Signature(s) Signed: 01/27/2015 3:35:09 PM By: Madelaine BhatBurns, III, Walter MD Entered By: Madelaine BhatBurns, III, Walter on 01/27/2015 13:05:32

## 2015-02-10 ENCOUNTER — Encounter: Payer: Medicare Other | Admitting: Surgery

## 2015-02-10 ENCOUNTER — Other Ambulatory Visit
Admission: RE | Admit: 2015-02-10 | Discharge: 2015-02-10 | Disposition: A | Discharge status: Home / Self Care | Payer: Medicare Other | Source: Ambulatory Visit | Attending: Surgery | Admitting: Surgery

## 2015-02-10 DIAGNOSIS — L089 Local infection of the skin and subcutaneous tissue, unspecified: Secondary | ICD-10-CM | POA: Diagnosis present

## 2015-02-10 DIAGNOSIS — L97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 with fat layer exposed: Secondary | ICD-10-CM | POA: Diagnosis not present

## 2015-02-10 DIAGNOSIS — S81801A Unspecified open wound, right lower leg, initial encounter: Secondary | ICD-10-CM | POA: Insufficient documentation

## 2015-02-11 NOTE — Progress Notes (Signed)
MARTICIA, REIFSCHNEIDER ([id number]) Visit Report for 02/10/2015 Arrival Information Details Patient Name: ADRIEANA, FENNELLY Date of Service: 02/10/2015 10:15 AM Medical Record Number: [id number] Patient Account Number: [id number] Date of Birth/Sex: 1965-12-06 (49 y.o. Female) Treating RN: Curtis Sites Primary Care Physician: Yves Dill Other Clinician: Referring Physician: Yves Dill Treating Physician/Extender: BURNS III, Regis Bill in Treatment: 3 Visit Information History Since Last Visit Added or deleted any medications: No Patient Arrived: Ambulatory Any new allergies or adverse reactions: No Arrival Time: 10:28 Had a fall or experienced change in No Accompanied By: staff activities of daily living that may affect Transfer Assistance: None risk of falls: Patient Identification Verified: Yes Signs or symptoms of abuse/neglect since last No Secondary Verification Process Yes visito Completed: Hospitalized since last visit: No Pain Present Now: Yes Electronic Signature(s) Signed: 02/10/2015 5:13:41 PM By: Curtis Sites Entered By: Curtis Sites on 02/10/2015 10:33:55 [name] ([id number]) -------------------------------------------------------------------------------- Clinic Level of Care Assessment Details Patient Name: [name] Date of Service: 02/10/2015 10:15 AM Medical Record Number: [id number] Patient Account Number: [id number] Date of Birth/Sex: 10-Dec-1965 (49 y.o. Female) Treating RN: Curtis Sites Primary Care Physician: Yves Dill Other Clinician: Referring Physician: Yves Dill Treating Physician/Extender: BURNS III, Regis Bill in Treatment: 3 Clinic Level of Care Assessment Items TOOL 4 Quantity Score  - Use when only an EandM is performed on FOLLOW-UP visit 0 ASSESSMENTS - Nursing Assessment / Reassessment X - Reassessment of Co-morbidities (includes updates in patient status) 1 10 X - Reassessment of Adherence to Treatment  Plan 1 5 ASSESSMENTS - Wound and Skin Assessment / Reassessment X - Simple Wound Assessment / Reassessment - one wound 1 5  - Complex Wound Assessment / Reassessment - multiple wounds 0  - Dermatologic / Skin Assessment (not related to wound area) 0 ASSESSMENTS - Focused Assessment  - Circumferential Edema Measurements - multi extremities 0  - Nutritional Assessment / Counseling / Intervention 0 X - Lower Extremity Assessment (monofilament, tuning fork, pulses) 1 5  - Peripheral Arterial Disease Assessment (using hand held doppler) 0 ASSESSMENTS - Ostomy and/or Continence Assessment and Care  - Incontinence Assessment and Management 0  - Ostomy Care Assessment and Management (repouching, etc.) 0 PROCESS - Coordination of Care X - Simple Patient / Family Education for ongoing care 1 15  - Complex (extensive) Patient / Family Education for ongoing care 0  - Staff obtains Chiropractor, Records, Test Results / Process Orders 0  - Staff telephones HHA, Nursing Homes / Clarify orders / etc 0  - Routine Transfer to another Facility (non-emergent condition) 0 Parchment, Sidni ([id number])  - Routine Hospital Admission (non-emergent condition) 0  - New Admissions / Manufacturing engineer / Ordering NPWT, Apligraf, etc. 0  - Emergency Hospital Admission (emergent condition) 0 X - Simple Discharge Coordination 1 10  - Complex (extensive) Discharge Coordination 0 PROCESS - Special Needs  - Pediatric / Minor Patient Management 0  - Isolation Patient Management 0  - Hearing / Language / Visual special needs 0  - Assessment of Community assistance (transportation, D/C planning, etc.) 0  - Additional assistance / Altered mentation 0  - Support Surface(s) Assessment (bed, cushion, seat, etc.) 0 INTERVENTIONS - Wound Cleansing / Measurement X - Simple Wound Cleansing - one wound 1 5  - Complex Wound Cleansing - multiple wounds 0 X - Wound Imaging  (photographs - any number of wounds) 1 5  - Wound Tracing (instead of photographs) 0 X - Simple Wound Measurement - one wound 1 5  - Complex  Wound Measurement - multiple wounds 0 INTERVENTIONS - Wound Dressings X - Small Wound Dressing one or multiple wounds 1 10  - Medium Wound Dressing one or multiple wounds 0  - Large Wound Dressing one or multiple wounds 0  - Application of Medications - topical 0  - Application of Medications - injection 0 INTERVENTIONS - Miscellaneous  - External ear exam 0 Hasz, Cheral ([id number])  - Specimen Collection (cultures, biopsies, blood, body fluids, etc.) 0  - Specimen(s) / Culture(s) sent or taken to Lab for analysis 0  - Patient Transfer (multiple staff / Michiel Sites Lift / Similar devices) 0  - Simple Staple / Suture removal (25 or less) 0  - Complex Staple / Suture removal (26 or more) 0  - Hypo / Hyperglycemic Management (close monitor of Blood Glucose) 0  - Ankle / Brachial Index (ABI) - do not check if billed separately 0 X - Vital Signs 1 5 Has the patient been seen at the hospital within the last three years: Yes Total Score: 80 Level Of Care: New/Established - Level 3 Electronic Signature(s) Signed: 02/10/2015 4:58:24 PM By: Curtis Sites Entered By: Curtis Sites on 02/10/2015 16:58:24 [name] ([id number]) -------------------------------------------------------------------------------- Encounter Discharge Information Details Patient Name: [name] Date of Service: 02/10/2015 10:15 AM Medical Record Number: [id number] Patient Account Number: [id number] Date of Birth/Sex: [birth year] (49 y.o. Female) Treating RN: Curtis Sites Primary Care Physician: Yves Dill Other Clinician: Referring Physician: Yves Dill Treating Physician/Extender: BURNS III, Regis Bill in Treatment: 3 Encounter Discharge Information Items Discharge Pain Level: 0 Discharge Condition: Stable Ambulatory  Status: Ambulatory Discharge Destination: Home Transportation: Private Auto Accompanied By: self Schedule Follow-up Appointment: Yes Medication Reconciliation completed and provided to Patient/Care No Jakia Kennebrew: Provided on Clinical Summary of Care: 02/10/2015 Form Type Recipient Paper Patient [place] Electronic Signature(s) Signed: 02/10/2015 4:30:13 PM By: Curtis Sites Previous Signature: 02/10/2015 11:00:41 AM Version By: Gwenlyn Perking Entered By: Curtis Sites on 02/10/2015 16:30:12 [name] ([id number]) -------------------------------------------------------------------------------- Lower Extremity Assessment Details Patient Name: [name] Date of Service: 02/10/2015 10:15 AM Medical Record Number: [id number] Patient Account Number: [id number] Date of Birth/Sex: Oct 07, 1965 (49 y.o. Female) Treating RN: Curtis Sites Primary Care Physician: Yves Dill Other Clinician: Referring Physician: Yves Dill Treating Physician/Extender: BURNS III, Regis Bill in Treatment: 3 Edema Assessment Assessed: [Left: No] [Right: No] Edema: [Left: N] [Right: o] Vascular Assessment Pulses: Posterior Tibial Dorsalis Pedis Palpable: [Right:Yes] Extremity colors, hair growth, and conditions: Extremity Color: [Right:Normal] Hair Growth on Extremity: [Right:Yes] Temperature of Extremity: [Right:Warm] Capillary Refill: [Right:< 3 seconds] Toe Nail Assessment Left: Right: Thick: No Discolored: No Deformed: No Improper Length and Hygiene: No Electronic Signature(s) Signed: 02/10/2015 5:13:41 PM By: Curtis Sites Entered By: Curtis Sites on 02/10/2015 10:41:02 [name] ([id number]) -------------------------------------------------------------------------------- Multi Wound Chart Details Patient Name: [name] Date of Service: 02/10/2015 10:15 AM Medical Record Number: [id number] Patient Account Number: [id number] Date of Birth/Sex: September 23, 1965 (49  y.o. Female) Treating RN: Curtis Sites Primary Care Physician: Yves Dill Other Clinician: Referring Physician: Yves Dill Treating Physician/Extender: BURNS III, Regis Bill in Treatment: 3 Vital Signs Height(in): 64 Pulse(bpm): 72 Weight(lbs): 215 Blood Pressure 131/84 (mmHg): Body Mass Index(BMI): 37 Temperature(F): 98.2 Respiratory Rate 18 (breaths/min): Photos: [1:No Photos] [N/A:N/A] Wound Location: [1:Right Lower Leg - Medial] [N/A:N/A] Wounding Event: [1:Bite] [N/A:N/A] Primary Etiology: [1:Infection - not elsewhere classified] [N/A:N/A] Date Acquired: [1:12/28/2014] [N/A:N/A] Weeks of Treatment: [1:3] [N/A:N/A] Wound Status: [1:Open] [N/A:N/A] Measurements L x W x D 2.9x3.1x0.3 [N/A:N/A] (cm) Area (cm) : [1:7.061] [N/A:N/A] Volume (cm) : [  1:2.118] [N/A:N/A] % Reduction in Area: [1:-19.90%] [N/A:N/A] % Reduction in Volume: -259.60% [N/A:N/A] Classification: [1:Full Thickness Without Exposed Support Structures] [N/A:N/A] Exudate Amount: [1:Large] [N/A:N/A] Exudate Type: [1:Serosanguineous] [N/A:N/A] Exudate Color: [1:red, brown] [N/A:N/A] Wound Margin: [1:Flat and Intact] [N/A:N/A] Granulation Amount: [1:Large (67-100%)] [N/A:N/A] Granulation Quality: [1:Red] [N/A:N/A] Necrotic Amount: [1:Small (1-33%)] [N/A:N/A] Exposed Structures: [1:Fascia: No Fat: No Tendon: No Muscle: No Joint: No Bone: No] [N/A:N/A] Limited to Skin Breakdown Epithelialization: Small (1-33%) N/A N/A Periwound Skin Texture: Edema: Yes N/A N/A Scarring: Yes Excoriation: No Induration: No Callus: No Crepitus: No Fluctuance: No Friable: No Rash: No Periwound Skin Maceration: Yes N/A N/A Moisture: Moist: Yes Dry/Scaly: No Periwound Skin Color: Atrophie Blanche: No N/A N/A Cyanosis: No Ecchymosis: No Erythema: No Hemosiderin Staining: No Mottled: No Pallor: No Rubor: No Temperature: No Abnormality N/A N/A Tenderness on Yes N/A N/A Palpation: Wound  Preparation: Ulcer Cleansing: N/A N/A Rinsed/Irrigated with Saline Topical Anesthetic Applied: Other: lidocaine 4% Treatment Notes Electronic Signature(s) Signed: 02/10/2015 5:13:41 PM By: Curtis Sites Entered By: Curtis Sites on 02/10/2015 10:46:06 [name] ([id number]) -------------------------------------------------------------------------------- Multi-Disciplinary Care Plan Details Patient Name: [name] Date of Service: 02/10/2015 10:15 AM Medical Record Number: [id number] Patient Account Number: [id number] Date of Birth/Sex: February 17, 1966 (49 y.o. Female) Treating RN: Curtis Sites Primary Care Physician: Yves Dill Other Clinician: Referring Physician: Yves Dill Treating Physician/Extender: BURNS III, Regis Bill in Treatment: 3 Active Inactive Orientation to the Wound Care Program Nursing Diagnoses: Knowledge deficit related to the wound healing center program Goals: Patient/caregiver will verbalize understanding of the Wound Healing Center Program Date Initiated: 01/20/2015 Goal Status: Active Interventions: Provide education on orientation to the wound center Notes: Soft Tissue Infection Nursing Diagnoses: Potential for infection: soft tissue Goals: Patient will remain free of wound infection Date Initiated: 01/20/2015 Goal Status: Active Interventions: Assess signs and symptoms of infection every visit Treatment Activities: Culture and sensitivity : 02/10/2015 Notes: Wound/Skin Impairment Nursing Diagnoses: Impaired tissue integrity GENESIA, CASLIN ([id number]) Goals: Ulcer/skin breakdown will heal within 14 weeks Date Initiated: 01/20/2015 Goal Status: Active Interventions: Assess patient/caregiver ability to obtain necessary supplies Notes: Electronic Signature(s) Signed: 02/10/2015 5:13:41 PM By: Curtis Sites Entered By: Curtis Sites on 02/10/2015 10:45:59 [name]  ([id number]) -------------------------------------------------------------------------------- Pain Assessment Details Patient Name: [name] Date of Service: 02/10/2015 10:15 AM Medical Record Number: [id number] Patient Account Number: [id number] Date of Birth/Sex: Jan 12, 1966 (49 y.o. Female) Treating RN: Curtis Sites Primary Care Physician: Yves Dill Other Clinician: Referring Physician: Yves Dill Treating Physician/Extender: BURNS III, Regis Bill in Treatment: 3 Active Problems Location of Pain Severity and Description of Pain Patient Has Paino Yes Site Locations Pain Location: Pain in Ulcers With Dressing Change: Yes Duration of the Pain. Constant / Intermittento Constant Pain Management and Medication Current Pain Management: Electronic Signature(s) Signed: 02/10/2015 5:13:41 PM By: Curtis Sites Entered By: Curtis Sites on 02/10/2015 10:34:06 [name] ([id number]) -------------------------------------------------------------------------------- Patient/Caregiver Education Details Patient Name: [name] Date of Service: 02/10/2015 10:15 AM Medical Record Number: [id number] Patient Account Number: [id number] Date of Birth/Gender: 1965/12/15 (49 y.o. Female) Treating RN: Curtis Sites Primary Care Physician: Yves Dill Other Clinician: Referring Physician: Yves Dill Treating Physician/Extender: BURNS III, Regis Bill in Treatment: 3 Education Assessment Education Provided To: Patient and Caregiver Education Topics Provided Wound/Skin Impairment: Handouts: Other: reportable s/s Methods: Demonstration, Explain/Verbal Responses: State content correctly Electronic Signature(s) Signed: 02/10/2015 5:13:41 PM By: Curtis Sites Entered By: Curtis Sites on 02/10/2015 10:52:53 [name] ([id number]) -------------------------------------------------------------------------------- Wound Assessment Details Patient Name:  [name] Date of Service: 02/10/2015 10:15  AM Medical Record Number: [id number] Patient Account Number: [id number] Date of Birth/Sex: [birth year] (49 y.o. Female) Treating RN: Curtis Sitesorthy, Joanna Primary Care Physician: Yves DillKhan, Neelam Other Clinician: Referring Physician: Yves DillKhan, Neelam Treating Physician/Extender: BURNS III, Regis BillWALTER Weeks in Treatment: 3 Wound Status Wound Number: 1 Primary Etiology: Infection - not elsewhere classified Wound Location: Right Lower Leg - Medial Wound Status: Open Wounding Event: Bite Date Acquired: 12/28/2014 Weeks Of Treatment: 3 Clustered Wound: No Photos Photo Uploaded By: Elliot GurneyWoody, RN, BSN, Kim on 02/10/2015 17:09:10 Wound Measurements Length: (cm) 2.9 Width: (cm) 3.1 Depth: (cm) 0.3 Area: (cm) 7.061 Volume: (cm) 2.118 % Reduction in Area: -19.9% % Reduction in Volume: -259.6% Epithelialization: Small (1-33%) Tunneling: No Undermining: No Wound Description Full Thickness Without Exposed Classification: Support Structures Wound Margin: Flat and Intact Exudate Large Amount: Exudate Type: Serosanguineous Exudate Color: red, brown Foul Odor After Cleansing: No Wound Bed Granulation Amount: Large (67-100%) Exposed Structure Granulation Quality: Red Fascia Exposed: No Necrotic Amount: Small (1-33%) Fat Layer Exposed: No Strout, Caiya ([id number]) Necrotic Quality: Adherent Slough Tendon Exposed: No Muscle Exposed: No Joint Exposed: No Bone Exposed: No Limited to Skin Breakdown Periwound Skin Texture Texture Color No Abnormalities Noted: No No Abnormalities Noted: No Callus: No Atrophie Blanche: No Crepitus: No Cyanosis: No Excoriation: No Ecchymosis: No Fluctuance: No Erythema: No Friable: No Hemosiderin Staining: No Induration: No Mottled: No Localized Edema: Yes Pallor: No Rash: No Rubor: No Scarring: Yes Temperature / Pain Moisture Temperature: No Abnormality No Abnormalities Noted: No Tenderness on  Palpation: Yes Dry / Scaly: No Maceration: Yes Moist: Yes Wound Preparation Ulcer Cleansing: Rinsed/Irrigated with Saline Topical Anesthetic Applied: Other: lidocaine 4%, Treatment Notes Wound #1 (Right, Medial Lower Leg) 1. Cleansed with: Clean wound with Normal Saline 2. Anesthetic Topical Lidocaine 4% cream to wound bed prior to debridement 3. Peri-wound Care: Barrier cream Skin Prep 4. Dressing Applied: Prisma Ag 5. Secondary Dressing Applied Bordered Foam Dressing Dry Gauze Electronic Signature(s) Signed: 02/10/2015 5:13:41 PM By: Curtis Sitesorthy, Joanna Entered By: Curtis Sitesorthy, Joanna on 02/10/2015 10:45:53 Tempie DonningSHUMATE, Nakiya ([id number]) Tempie DonningSHUMATE, Rahcel ([id number]) -------------------------------------------------------------------------------- Vitals Details Patient Name: Tempie DonningSHUMATE, Johni Date of Service: 02/10/2015 10:15 AM Medical Record Number: [id number] Patient Account Number: [id number] Date of Birth/Sex: [birth year] (49 y.o. Female) Treating RN: Curtis Sitesorthy, Joanna Primary Care Physician: Yves DillKhan, Neelam Other Clinician: Referring Physician: Yves DillKhan, Neelam Treating Physician/Extender: BURNS III, Regis BillWALTER Weeks in Treatment: 3 Vital Signs Time Taken: 10:33 Temperature (F): 98.2 Height (in): 64 Pulse (bpm): 72 Weight (lbs): 215 Respiratory Rate (breaths/min): 18 Body Mass Index (BMI): 36.9 Blood Pressure (mmHg): 131/84 Reference Range: 80 - 120 mg / dl Electronic Signature(s) Signed: 02/10/2015 5:13:41 PM By: Curtis Sitesorthy, Joanna Entered By: Curtis Sitesorthy, Joanna on 02/10/2015 10:34:58

## 2015-02-11 NOTE — Progress Notes (Signed)
EDELMIRA, GALLOGLY ([id number]) Visit Report for 02/10/2015 Chief Complaint Document Details Patient Name: [name], [name] 02/10/2015 10:15 Date of Service: AM Medical Record [id number] Number: Patient Account Number: [id number] 27-Nov-1965 (49 y.o. Treating RN: Curtis Sites Date of Birth/Sex: Female) Other Clinician: Primary Care Physician: Yves Dill Treating BURNS III, Jearldean Gutt Referring Physician: Yves Dill Physician/Extender: Tania Ade in Treatment: 3 Information Obtained from: Patient Chief Complaint Right calf ulcer, status post I and D of abscess. Electronic Signature(s) Signed: 02/10/2015 3:59:00 PM By: Madelaine Bhat MD Entered By: Madelaine Bhat on 02/10/2015 13:37:29 [name] ([id number]) -------------------------------------------------------------------------------- HPI Details Patient Name: [name], [name] 02/10/2015 10:15 Date of Service: AM Medical Record [id number] Number: Patient Account Number: [id number] 06-Jan-1966 (49 y.o. Treating RN: Curtis Sites Date of Birth/Sex: Female) Other Clinician: Primary Care Physician: Yves Dill Treating BURNS III, Veralyn Lopp Referring Physician: Yves Dill Physician/Extender: Tania Ade in Treatment: 3 History of Present Illness HPI Description: Pleasant 49 year old with history of borderline personality disorder and CP. No history of diabetes or peripheral arterial disease. Right ABI 1.2. She developed an ulceration on her right anterior calf secondary to an insect bite. This progressed to an abscess, and she underwent I and D on 01/02/2015 by Dr. Michela Pitcher. Cultures grew oxacillin sensitive staph aureus. She completed a course of clindamycin. She lives in a group home. Performing dressing changes with silver alginate. Using Ace wrap for edema control. She returns to clinic for follow-up and is without new complaints. No significant pain. No fever or chills. Minimal drainage. Electronic Signature(s) Signed:  02/10/2015 3:59:00 PM By: Madelaine Bhat MD Entered By: Madelaine Bhat on 02/10/2015 13:38:26 [name] ([id number]) -------------------------------------------------------------------------------- Physical Exam Details Patient Name: [name], [name] 02/10/2015 10:15 Date of Service: AM Medical Record [id number] Number: Patient Account Number: [id number] 1965/07/19 (49 y.o. Treating RN: Curtis Sites Date of Birth/Sex: Female) Other Clinician: Primary Care Physician: Yves Dill Treating BURNS III, Ulonda Klosowski Referring Physician: Yves Dill Physician/Extender: Tania Ade in Treatment: 3 Constitutional . Pulse regular. Respirations normal and unlabored. Afebrile. Marland Kitchen Respiratory WNL. No retractions.. Cardiovascular Pedal Pulses WNL. Integumentary (Hair, Skin) .Marland Kitchen Neurological Sensation normal to touch, pin,and vibration. Psychiatric . Oriented times 3.. . Notes Right anterior calf ulceration. Full-thickness. Mild surrounding erythema. No residual abscess. Minimal edema. Palpable DP. Right ABI 1.2. Electronic Signature(s) Signed: 02/10/2015 3:59:00 PM By: Madelaine Bhat MD Entered By: Madelaine Bhat on 02/10/2015 13:39:06 [name] ([id number]) -------------------------------------------------------------------------------- Physician Orders Details Patient Name: [name], [name] 02/10/2015 10:15 Date of Service: AM Medical Record [id number] Number: Patient Account Number: [id number] 1966/03/16 (49 y.o. Treating RN: Curtis Sites Date of Birth/Sex: Female) Other Clinician: Primary Care Physician: Yves Dill Treating BURNS III, Renesmay Nesbitt Referring Physician: Yves Dill Physician/Extender: Tania Ade in Treatment: 3 Verbal / Phone Orders: Yes Clinician: Curtis Sites Read Back and Verified: Yes Diagnosis Coding Wound Cleansing Wound #1 Right,Medial Lower Leg o Clean wound with Normal Saline. o May Shower, gently pat wound dry prior to  applying new dressing. Anesthetic Wound #1 Right,Medial Lower Leg o Topical Lidocaine 4% cream applied to wound bed prior to debridement Skin Barriers/Peri-Wound Care Wound #1 Right,Medial Lower Leg o Barrier cream - on peri-wound Primary Wound Dressing Wound #1 Right,Medial Lower Leg o Prisma Ag Secondary Dressing Wound #1 Right,Medial Lower Leg o ABD and Kerlix/Conform o Dry Gauze Dressing Change Frequency Wound #1 Right,Medial Lower Leg o Change dressing every other day. - Three times weekly Follow-up Appointments Wound #1 Right,Medial Lower Leg o Return Appointment in 2 weeks. Edema Control Wound #1 Right,Medial Lower Leg o Tubigrip  [name], [name] ([id number]) Additional Orders / Instructions Wound #1 Right,Medial Lower Leg o Increase protein intake. o Activity as tolerated Home Health Wound #1 Right,Medial Lower Leg o [facility] Vis[facility] - [facility] o Home Health Nurse may visit PRN to address patientos wound care needs. o FACE TO FACE ENCOUNTER: MEDICARE and MEDICAID PATIENTS: I certify that this patient is under my care and that I had a face-to-face encounter that meets the physician face-to-face encounter requirements with this patient on this date. The encounter with the patient was in whole or in part for the following MEDICAL CONDITION: (primary reason for Home Healthcare) MEDICAL NECESSITY: I certify, that based on my findings, NURSING services are a medically necessary home health service. HOME BOUND STATUS: I certify that my clinical findings support that this patient is homebound (i.e., Due to illness or injury, pt requires aid of supportive devices such as crutches, cane, wheelchairs, walkers, the use of special transportation or the assistance of another person to leave their place of residence. There is a normal inability to leave the home and doing so requires considerable and taxing effort. Other absences are for  medical reasons / religious services and are infrequent or of short duration when for other reasons). o If current dressing causes regression in wound condition, may D/C ordered dressing product/s and apply Normal Saline Moist Dressing daily until next Wound Healing Center / Other MD appointment. Notify Wound Healing Center of regression in wound condition at [phone]. o Please direct any NON-WOUND related issues/requests for orders to patient's Primary Care Physician Laboratory o Culture and Sensitivity oooo Electronic Signature(s) Signed: 02/10/2015 3:59:00 PM By: Madelaine Bhat MD Signed: 02/10/2015 5:13:41 PM By: Curtis Sites Entered By: Curtis Sites on 02/10/2015 10:57:27 [name] ([id number]) -------------------------------------------------------------------------------- Problem List Details Patient Name: [name], [name] 02/10/2015 10:15 Date of Service: AM Medical Record [id number] Number: Patient Account Number: [id number] [birth year] (49 y.o. 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 Treating RN: Curtis Sites Date of Birth/Sex: Female) Other Clinician: Primary Care Physician: Yves Dill Treating BURNS III, Zollie Beckers Referring Physician: Yves Dill Physician/Extender: Tania Ade in Treatment: 3 Subjective Chief Complaint Information obtained from Patient Right calf ulcer, status post I and D of abscess. History of Present Illness (HPI) Pleasant 49 year old with history of borderline personality disorder and CP. No history of diabetes or peripheral arterial disease. Right ABI 1.2. She developed an ulceration on her right anterior calf secondary to an insect bite. This progressed to an abscess, and she underwent I and D on 01/02/2015 by Dr. Michela Pitcher. Cultures grew oxacillin sensitive staph aureus. She completed a course of clindamycin. She lives in a group home. Performing dressing changes with silver alginate. Using Ace wrap for edema control. She returns to clinic for follow-up and is without new complaints. No significant pain. No fever or chills. Minimal drainage. Objective Constitutional Pulse regular.  Respirations normal and unlabored. Afebrile. Vitals Time Taken: 10:33 AM, Height: 64 in, Weight: 215 lbs, BMI: 36.9, Temperature: 98.2 F, Pulse: 72 bpm, Respiratory Rate: 18 breaths/min, Blood Pressure: 131/84 mmHg. Respiratory WNL. No retractions.. Cardiovascular Pedal Pulses WNL. Neurological Sherri DonningSHUMATE, [name] ([id number]) Sensation normal to touch, pin,and vibration. Psychiatric Oriented times 3.. General Notes: Right anterior calf ulceration. Full-thickness. Mild surrounding erythema. No residual abscess. Minimal edema. Palpable DP. Right ABI 1.2. Integumentary (Hair, Skin) Wound #1 status is Open. Original cause of wound was Bite. The wound is located on the Right,Medial Lower Leg. The wound measures 2.9cm length x 3.1cm width x 0.3cm depth; 7.061cm^2 area and 2.118cm^3  volume. The wound is limited to skin breakdown. There is no tunneling or undermining noted. There is a large amount of serosanguineous drainage noted. The wound margin is flat and intact. There is large (67-100%) red granulation within the wound bed. There is a small (1-33%) amount of necrotic tissue within the wound bed including Adherent Slough. The periwound skin appearance exhibited: Localized Edema, Scarring, Maceration, Moist. The periwound skin appearance did not exhibit: Callus, Crepitus, Excoriation, Fluctuance, Friable, Induration, Rash, Dry/Scaly, Atrophie Blanche, Cyanosis, Ecchymosis, Hemosiderin Staining, Mottled, Pallor, Rubor, Erythema. Periwound temperature was noted as No Abnormality. The periwound has tenderness on palpation. Assessment Active Problems ICD-10 L97.212 - Non-pressure chronic ulcer of right calf with fat layer exposed S81.801S - Unspecified open wound, right lower leg, sequela F60.3 - Borderline personality disorder Right anterior calf ulceration, status post I and D of abscess. Mild cellulitis. Plan Wound Cleansing: Wound #1 Right,Medial Lower Leg: Clean wound with Normal Saline. May Shower, gently pat wound dry prior to applying new dressing. Anesthetic: Sherri DonningSHUMATE, [name] ([id number]) Wound #1 Right,Medial Lower Leg: Topical Lidocaine 4% cream applied to wound bed prior to debridement Skin Barriers/Peri-Wound Care: Wound #1 Right,Medial Lower Leg: Barrier cream - on peri-wound Primary Wound Dressing: Wound #1 Right,Medial Lower Leg: Prisma Ag Secondary Dressing: Wound #1 Right,Medial Lower Leg: ABD and Kerlix/Conform Dry Gauze Dressing Change Frequency: Wound #1 Right,Medial Lower Leg: Change dressing every other day. - Three times weekly Follow-up Appointments: Wound #1 Right,Medial Lower Leg: Return Appointment in 2 weeks. Edema Control: Wound #1 Right,Medial Lower Leg: Tubigrip Additional Orders / Instructions: Wound #1 Right,Medial  Lower Leg: Increase protein intake. Activity as tolerated Home Health: Wound #1 Right,Medial Lower Leg: [facility] Nurse may visit PRN to address patient s wound care needs. FACE TO FACE ENCOUNTER: MEDICARE and MEDICAID PATIENTS: I certify that this patient is under my care and that I had a face-to-face encounter that meets the physician face-to-face encounter requirements with this patient on this date. The encounter with the patient was in whole or in part for the following MEDICAL CONDITION: (primary reason for Home Healthcare) MEDICAL NECESSITY: I certify, that based on my findings, NURSING services are a medically necessary home health service. HOME BOUND STATUS: I certify that my clinical findings support that this patient is homebound (i.e., Due to illness or injury, pt requires aid of supportive devices such as crutches, cane, wheelchairs, walkers, the use of special transportation or the assistance of another person to leave their place of residence. There is a normal inability to leave the home and doing so requires considerable and taxing effort. Other absences are for medical reasons / religious services and are infrequent or of short duration when for other reasons). If current dressing causes regression in wound condition, may D/C ordered dressing product/s and apply Normal Saline  Moist Dressing daily until next Wound Healing Center / Other MD appointment. Notify Wound Healing Center of regression in wound condition at [phone]. Please direct any NON-WOUND related issues/requests for orders to patient's Primary Care Physician Laboratory ordered were: Culture and Sensitivity [name], [name] ([id number]) Switch to Promogran Prisma dressing changes. Doxycycline started. Follow-up on wound cultures obtained today. Edema control with Tubigrip or Ace wrap. Electronic Signature(s) Signed: 02/10/2015 3:59:00 PM By: Madelaine Bhat  MD Entered By: Madelaine Bhat on 02/10/2015 13:39:49 [name] ([id number]) -------------------------------------------------------------------------------- SuperBill Details Patient Name: [name] Date of Service: 02/10/2015 Medical Record Patient Account Number: [id number] Number: Treating RN: Curtis Sites January 30, 1966 (49 y.o. Other Clinician: Date of Birth/Sex: Female) Treating BURNS III, Primary Care Physician: Yves Dill Physician/Extender: Zollie Beckers Referring Physician: Yves Dill Weeks in Treatment: 3 Diagnosis Coding ICD-10 Codes Code Description [phone] Non-pressure chronic ulcer of right calf with fat layer exposed S81.801S Unspecified open wound, right lower leg, sequela F60.3 Borderline personality disorder L03.115 Cellulitis of right lower limb Facility Procedures CPT4 Code: [id number] Description: 99213 - WOUND CARE VISIT-LEV 3 EST PT Modifier: Quantity: 1 Physician Procedures CPT4 Code: [id number] Description: 99213 - WC PHYS LEVEL 3 - EST PT ICD-10 Description Diagnosis L97.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 with fat S81.801S Unspecified open wound, right lower leg, sequela L03.115 Cellulitis of right lower limb Modifier: layer exposed Quantity: 1 Electronic Signature(s) Signed: 02/10/2015 4:58:35 PM By: Curtis Sites Previous Signature: 02/10/2015 3:59:00 PM Version By: Madelaine Bhat MD Entered By: Curtis Sites on 02/10/2015 16:58:35

## 2015-02-17 LAB — WOUND CULTURE: CULTURE: NO GROWTH

## 2015-02-24 ENCOUNTER — Encounter: Payer: Medicare Other | Attending: Surgery | Admitting: Surgery

## 2015-02-24 DIAGNOSIS — F603 Borderline personality disorder: Secondary | ICD-10-CM | POA: Diagnosis not present

## 2015-02-24 DIAGNOSIS — S81801S Unspecified open wound, right lower leg, sequela: Secondary | ICD-10-CM | POA: Diagnosis not present

## 2015-02-24 DIAGNOSIS — X58XXXS Exposure to other specified factors, sequela: Secondary | ICD-10-CM | POA: Insufficient documentation

## 2015-02-24 DIAGNOSIS — L97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 with fat layer exposed: Secondary | ICD-10-CM | POA: Insufficient documentation

## 2015-02-26 ENCOUNTER — Encounter: Payer: Self-pay | Admitting: *Deleted

## 2015-02-26 ENCOUNTER — Emergency Department
Admission: EM | Admit: 2015-02-26 | Discharge: 2015-02-26 | Disposition: A | Discharge status: Home / Self Care | Payer: Medicare Other | Attending: Emergency Medicine | Admitting: Emergency Medicine

## 2015-02-26 DIAGNOSIS — Z880 Allergy status to penicillin: Secondary | ICD-10-CM | POA: Insufficient documentation

## 2015-02-26 DIAGNOSIS — R45851 Suicidal ideations: Secondary | ICD-10-CM | POA: Diagnosis not present

## 2015-02-26 DIAGNOSIS — M79604 Pain in right leg: Secondary | ICD-10-CM | POA: Diagnosis present

## 2015-02-26 DIAGNOSIS — F329 Major depressive disorder, single episode, unspecified: Secondary | ICD-10-CM | POA: Insufficient documentation

## 2015-02-26 DIAGNOSIS — L03115 Cellulitis of right lower limb: Secondary | ICD-10-CM | POA: Insufficient documentation

## 2015-02-26 MED ORDER — HYDROCODONE-ACETAMINOPHEN 5-325 MG PO TABS
2.0000 | ORAL_TABLET | Freq: Once | ORAL | Status: AC
  Administered 2015-02-26: 2 via ORAL
  Filled 2015-02-26: qty 2

## 2015-02-26 MED ORDER — DIAZEPAM 5 MG PO TABS
5.0000 mg | ORAL_TABLET | Freq: Once | ORAL | Status: AC
  Administered 2015-02-26: 5 mg via ORAL
  Filled 2015-02-26: qty 1

## 2015-02-26 NOTE — ED Notes (Signed)
Pt arrives via EMS with complaints of right leg pain, states she was bit by a bug in September and has a wound on her right shin, states she is being seen at the wound clinic for tx, right leg arrives wrapped in ACE and gauze, MD at [facility], leg unwrapped[facility]

## 2015-02-26 NOTE — BH Assessment (Signed)
Assessment Note  [name] is an 49 y.o. female who presents to the ER, via EMS due to voicing SI, while at her Group Home. Patient was being seen by her [facility] Nurse, getting her dressing change. Patient told her she was having thoughts of hurting herself. It was discovered, the patient was upset with another resident. He was talking about staff and and other residents. When he starts talking about them in a negative way, it gets her upset. On today, she got to the point of crying. She admits to saying she was having SI, as a means to come to the ER to get away from the other resident.   Patient was able to acknowledge, coming to the ER isn't the best coping technique to dealing with the other resident. Writer assured her, coming to the ER does work, when you want to get away, but it doesn't solve the problem. It only delays it.  This Clinical research associate provider client with supportive counseling and was able to come up with a plan to address her concerns with the other resident. Patient identify and listed her stressors. She developed a plan to cope, in the event the resident make her upset again. Writer called the Group Home, [facility] [phone]) and update them about what happened and the true cause as to why she came to the ER. The patient didn't let the Group Home know what happened. She just told them, "I want to die."  Writer also shared with the Group Home, the plan the of the patient being discharge back in their care and what the patient will do, the next time she gets upset.  Discussed pt. with ER MD (Dr. Mayford Knife) and , pt. is able to d/c home when medically cleared. Pt. have been giving information and instructions on how to follow up with Out Pt. Referral([employer]) and Mobile Crisis. Patient also giving a hard copy of the plan she will use when upset and a copy was placed on her paper chart.  Group Home will call a taxi to come pick the  patient up.   Diagnosis: Depression and Anxiety  Past Medical History:  Past Medical History  Diagnosis Date  . Asthma   . Anxiety   . GERD (gastroesophageal reflux disease)   . Mild cognitive impairment   . Cerebral palsy (HCC)   . Borderline personality disorder   . Depression   . Wound abscess     Past Surgical History  Procedure Laterality Date  . Cholecystectomy    . Tonsillectomy    . Incision and drainage abscess Right 01/02/2015    Procedure: INCISION AND DRAINAGE ABSCESS;  Surgeon: Tiney Rouge III, MD;  Location: [facility] ORS;  Service: General;  Laterality: Right;    Family History:  Family History  Problem Relation Age of Onset  . Diabetes Mother   . Heart attack Father     Social History:  reports that she has never smoked. She has never used smokeless tobacco. She reports that she does not drink alcohol or use illicit drugs.  Additional Social History:  Alcohol / Drug Use Pain Medications: See PTA Prescriptions: See PTA Over the Counter: See PTA History of alcohol / drug use?: No history of alcohol / drug abuse Longest period of sobriety (when/how long): No Abuse Reported Negative Consequences of Use:  (No Abuse Reported) Withdrawal Symptoms:  (No Abuse Reported)  CIWA: CIWA-Ar BP: 109/77 mmHg Pulse Rate: 68 COWS:    Allergies:  Allergies  Allergen Reactions  . Penicillins Other (See Comments)    Pt states that she is "just scared to take it".      Home Medications:  (Not in a hospital admission)  OB/GYN Status:  No LMP recorded. Patient has had an implant.  General Assessment Data [facility] ED TTS Assessment: In system Is this a Tele or Face-to-Face Assessment?: Face-to-Face Is this an Initial Assessment or a Re-assessment for this encounter?: Initial Assessment Marital status: Single Maiden name: none Is patient pregnant?: No Pregnancy Status: No Living Arrangements: Group Home [employer]([facility] [phone]) Can  pt return to current living arrangement?: Yes Admission Status: Voluntary Is patient capable of signing voluntary admission?: Yes Referral Source: Self/Family/Friend Insurance type: Medicare  Medical Screening Exam [facility] Walk-in ONLY) Medical Exam completed: Yes  Crisis Care Plan Living Arrangements: Group Home [employer]([address] [phone]) Name of Psychiatrist: None Name of Therapist: None  Education Status Is patient currently in school?: No Current Grade: n/a Highest grade of school patient has completed: 10th Grade Name of school: n/a Contact person: n/a  Risk to self with the past 6 months Suicidal Ideation: No Has patient been a risk to self within the past 6 months prior to admission? : No Suicidal Intent: No Has patient had any suicidal intent within the past 6 months prior to admission? : No Is patient at risk for suicide?: No Suicidal Plan?: No Has patient had any suicidal plan within the past 6 months prior to admission? : No Access to Means: No What has been your use of drugs/alcohol within the last 12 months?: None Previous Attempts/Gestures: No How many times?: 0 Other Self Harm Risks: None Reported or Noted Triggers for Past Attempts: None known Intentional Self Injurious Behavior: None Family Suicide History: No Recent stressful life event(s): Other (Comment), Conflict (Comment) (Trouble with [employer] Mate) Persecutory voices/beliefs?: No Depression: Yes Depression Symptoms: Feeling angry/irritable, Feeling worthless/self pity Substance abuse history and/or treatment for substance abuse?: No Suicide prevention information given to non-admitted patients: Not applicable  Risk to Others within the past 6 months Homicidal Ideation: No Does patient have any lifetime risk of violence toward others beyond the six months prior to admission? : No Thoughts of Harm to Others: No Current Homicidal Intent: No Current Homicidal Plan: No Access to Homicidal  Means: No Identified Victim: None Reported or Noted History of harm to others?: No Assessment of Violence: None Noted Violent Behavior Description: None Reported or Noted Does patient have access to weapons?: No Criminal Charges Pending?: No Does patient have a court date: No Is patient on probation?: No  Psychosis Hallucinations: None noted Delusions: Persecutory  Mental Status Report Appearance/Hygiene: In scrubs, In hospital gown, Unremarkable Eye Contact: Fair Motor Activity: Freedom of movement Speech: Logical/coherent, Soft, Slow Level of Consciousness: Alert, Crying Mood: Depressed, Anxious, Helpless, Irritable, Sad, Pleasant Affect: Appropriate to circumstance, Anxious, Sad Anxiety Level: Minimal Thought Processes: Relevant, Coherent Judgement: Unimpaired Orientation: Person, Place, Time, Situation, Appropriate for developmental age Obsessive Compulsive Thoughts/Behaviors: Minimal  Cognitive Functioning Concentration: Normal Memory: Recent Intact, Remote Intact IQ: Average Insight: Poor Impulse Control: Poor Appetite: Fair Weight Loss: 0 Weight Gain: 0 Sleep: No Change Total Hours of Sleep: 8 Vegetative Symptoms: None  ADLScreening [facility] Assessment Services) Patient's cognitive ability adequate to safely complete daily activities?: Yes Patient able to express need for assistance with ADLs?: Yes Independently performs ADLs?: Yes (appropriate for developmental age)  Prior Inpatient Therapy Prior Inpatient Therapy: Yes Prior Therapy Dates: 09/2014 &  10/2011 Prior Therapy Facilty/Provider(s): [facility] Reason for Treatment: Depression  Prior Outpatient Therapy Prior Outpatient Therapy: Yes Prior Therapy Dates: Current Prior Therapy Facilty/Provider(s): [facility] Reason for Treatment: Depression and Schizophrenia Does patient have an ACCT team?: No Does patient have Intensive In-House Services?  : No Does patient have [facility] services? :  No Does patient have P4CC services?: No  ADL Screening (condition at time of admission) Patient's cognitive ability adequate to safely complete daily activities?: Yes Is the patient deaf or have difficulty hearing?: No Does the patient have difficulty seeing, even when wearing glasses/contacts?: Yes Does the patient have difficulty concentrating, remembering, or making decisions?: Yes Patient able to express need for assistance with ADLs?: Yes Does the patient have difficulty dressing or bathing?: No Independently performs ADLs?: Yes (appropriate for developmental age) Does the patient have difficulty walking or climbing stairs?: Yes Weakness of Legs: Right Weakness of Arms/Hands: None  Home Assistive Devices/Equipment Home Assistive Devices/Equipment: None  Therapy Consults (therapy consults require a physician order) PT Evaluation Needed: No OT Evalulation Needed: No SLP Evaluation Needed: No Abuse/Neglect Assessment (Assessment to be complete while patient is alone) Physical Abuse: Denies Verbal Abuse: Denies Sexual Abuse: Denies Exploitation of patient/patient's resources: Denies Self-Neglect: Denies Values / Beliefs Cultural Requests During Hospitalization: None Spiritual Requests During Hospitalization: None Consults Spiritual Care Consult Needed: No Social Work Consult Needed: No Merchant navy officer (For Healthcare) Does patient have an advance directive?: No Would patient like information on creating an advanced directive?: No - patient declined information    Additional Information 1:1 In Past 12 Months?: No CIRT Risk: No Elopement Risk: No Does patient have medical clearance?: Yes  Child/Adolescent Assessment Running Away Risk: Denies (Patient is an adult)  Disposition:  Disposition Initial Assessment Completed for this Encounter: Yes Disposition of Patient: Referred to, Outpatient treatment Type of outpatient treatment: Adult [employer]) Patient referred to: Other (Comment), Outpatient clinic referral (Back to Group Home & [facility])  On Site Evaluation by:   Reviewed with Physician:     Lilyan Gilford, MS, LCAS, LPC, NCC, CCSI 02/26/2015 12:13 PM

## 2015-02-26 NOTE — ED Provider Notes (Signed)
[facility] Emergency Department Provider Note     Time seen: ----------------------------------------- 9:53 AM on 02/26/2015 -----------------------------------------    I have reviewed the triage vital signs and the nursing notes.   HISTORY  Chief Complaint Leg Pain    HPI [name] is a 49 y.o. female who presents ER for severe right leg pain. Patient states she was bitten by a bug in September and has a wound in the right lower extremity. Patient's been seen and the wound clinic physician and is receiving dressing changes. [facility] and she was sent here because she was feeling suicidal due to the pain.   Past Medical History  Diagnosis Date  . Asthma   . Anxiety   . GERD (gastroesophageal reflux disease)   . Mild cognitive impairment   . Cerebral palsy (HCC)   . Borderline personality disorder   . Depression   . Wound abscess     Patient Active Problem List   Diagnosis Date Noted  . Anxiety 12/31/2014  . Cellulitis and abscess of leg 12/31/2014  . 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 GERD (gastroesophageal reflux disease) 09/24/2014  . COPD (chronic obstructive pulmonary disease) (HCC) 09/24/2014  . Borderline personality disorder 09/24/2014  . Major depressive disorder, recurrent episode, moderate (HCC) 09/24/2014  . Cerebral palsy (HCC) 08/31/2014    Past Surgical History  Procedure Laterality Date  . Cholecystectomy    . Tonsillectomy    . Incision and drainage abscess Right 01/02/2015    Procedure: INCISION AND DRAINAGE ABSCESS;  Surgeon: Tiney Rougealph Ely III, MD;  Location: [facility] ORS;  Service: General;  Laterality: Right;    Allergies Penicillins  Social History Social History  Substance Use Topics  . Smoking status: Never Smoker   . Smokeless tobacco: Never Used  . Alcohol Use: No    Review of Systems Constitutional: Negative for fever. Eyes: Negative for visual  changes. ENT: Negative for sore throat. Cardiovascular: Negative for chest pain. Respiratory: Negative for shortness of breath. Gastrointestinal: Negative for abdominal pain, vomiting and diarrhea. Genitourinary: Negative for dysuria. Musculoskeletal: Positive right lower extremity pain Skin: Negative for rash. Neurological: Negative for headaches, focal weakness or numbness. Psychiatric: Positive for suicidal thoughts  10-point ROS otherwise negative.  ____________________________________________   PHYSICAL EXAM:  VITAL SIGNS: ED Triage Vitals  Enc Vitals Group     BP 02/26/15 0934 127/78 mmHg     Pulse Rate 02/26/15 0934 72     Resp 02/26/15 0934 18     Temp 02/26/15 0934 98.3 F (36.8 C)     Temp Source 02/26/15 0934 Oral     SpO2 02/26/15 0932 98 %     Weight 02/26/15 0934 223 lb (101.152 kg)     Height 02/26/15 0934 5\' 3"  (1.6 m)     Head Cir --      Peak Flow --      Pain Score 02/26/15 0936 10     Pain Loc --      Pain Edu? --      Excl. in GC? --     Constitutional: Alert and oriented. Well appearing and in no distress. Eyes: Conjunctivae are normal. PERRL. Normal extraocular movements. ENT   Head: Normocephalic and atraumatic.   Nose: No congestion/rhinnorhea.   Mouth/Throat: Mucous membranes are moist.   Neck: No stridor. Cardiovascular: Normal rate, regular rhythm. Normal and symmetric distal pulses are present in all extremities. No murmurs, rubs, or gallops. Respiratory: Normal  respiratory effort without tachypnea nor retractions. Breath sounds are clear and equal bilaterally. No wheezes/rales/rhonchi. Gastrointestinal: Soft and nontender. No distention. No abdominal bruits.  Musculoskeletal: Right mid tibia lesion consistent with ulceration and healing cellulitis. Overall the wound looks good. Neurologic:  Normal speech and language. No gross focal neurologic deficits are appreciated. Speech is normal. No gait instability. Skin:  Skin is  warm, dry and intact. No rash noted. Psychiatric: Depressed mood and affect, patient endorses suicidality. ____________________________________________  ED COURSE:  Pertinent labs & imaging results that were available during my care of the patient were reviewed by me and considered in my medical decision making (see chart for details). Patient is no acute distress, I do not feel like she is in imminent threat to herself or others. She'll be given pain medicine and an Valium. I will reevaluate  ____________________________________________  FINAL ASSESSMENT AND PLAN  Right leg pain, suicidal ideation  Plan: Patient is in no acute distress, she is not felt to be a threat to herself or others. She is Kirsten close follow-up with her doctor and continue wound care as directed.   Emily Filbert, MD   Emily Filbert, MD 02/26/15 [id number]

## 2015-02-26 NOTE — ED Notes (Signed)
Spoke with Burna MortimerWanda at [facility], per Burna MortimerWanda, [employer] [facility] pick patient up.

## 2015-02-26 NOTE — Discharge Instructions (Signed)
Helping Someone Who is Suicidal Suicide is when someone takes his or her own life. Someone who is thinking about suicide needs immediate help. Although you might not know what to say or do to help, start by letting that person know you care. Listen to him or her. Then talk about how to get help. Help is available through therapy, medicine, and other treatments. WHAT ARE SIGNS THAT SOMEONE IS SUICIDAL? Common signs include:   Signs of depression, such as:  Rage.  Irritability.  Shame.  Excessive worry.  Loss of interest in things the person once enjoyed.  Changes in social behaviors and relationships, including:  Isolating oneself.  Withdrawing from friends and family.  Giving away possessions.  Saying good-bye.  Acting aggressively.  Sleeping more or less than usual.  Having trouble managing school or work.   Talking about feeling hopeless or being a burden.  Engaging in risky behaviors, such as drinking more alcohol or using more drugs. WHAT ARE THE RISK FACTORS FOR SUICIDE? Risk factors for suicide include:   Other suicides in the family.  A history of suicide attempts.  Depression or other mental health issues.  Being in jail or facing jail time.  Having had close friends who have committed suicide.  Alcohol or drug abuse, especially combined with a mental illness.  WHAT SHOULD I DO IF SOMEONE IS SUICIDAL? If you believe a person is in immediate danger of committing suicide, call your local emergency services (911 in the [place] for help. If a person says he or she wants to commit suicide, take the threat seriously. Help the person get help right away by:   Calling your local emergency services.  Calling a suicide prevention hotline.  Contacting a crisis center or a local suicide prevention center. These are often located at hospitals, clinics, [employer] service organizations, social service providers, or health departments. If a person confides in you  that he or she is considering suicide:   Listen to the person's thoughts and concerns with compassion.  Let the person know you will stay with him or her.   Ask if the person is having thoughts of hurting himself or herself.   Offer to help the person get to a doctor or mental health professional.   Remove all weapons and medicines from the person's living space.  Do not promise to keep his or her thoughts of suicide a secret.   This information is not intended to replace advice given to you by your health care provider. Make sure you discuss any questions you have with your health care provider.   Document Released: 10/08/2002 Document Revised: 04/24/2014 Document Reviewed: 09/11/2013 Elsevier Interactive Patient Education 2016 Elsevier Inc.  Musculoskeletal Pain Musculoskeletal pain is muscle and boney aches and pains. These pains can occur in any part of the body. Your caregiver may treat you without knowing the cause of the pain. They may treat you if blood or urine tests, X-rays, and other tests were normal.  CAUSES There is often not a definite cause or reason for these pains. These pains may be caused by a type of germ (virus). The discomfort may also come from overuse. Overuse includes working out too hard when your body is not fit. Boney aches also come from weather changes. Bone is sensitive to atmospheric pressure changes. HOME CARE INSTRUCTIONS   Ask when your test results will be ready. Make sure you get your test results.  Only take over-the-counter or prescription medicines for pain, discomfort, or  fever as directed by your caregiver. If you were given medications for your condition, do not drive, operate machinery or power tools, or sign legal documents for 24 hours. Do not drink alcohol. Do not take sleeping pills or other medications that may interfere with treatment.  Continue all activities unless the activities cause more pain. When the pain lessens, slowly  resume normal activities. Gradually increase the intensity and duration of the activities or exercise.  During periods of severe pain, bed rest may be helpful. Lay or sit in any position that is comfortable.  Putting ice on the injured area.  Put ice in a bag.  Place a towel between your skin and the bag.  Leave the ice on for 15 to 20 minutes, 3 to 4 times a day.  Follow up with your caregiver for continued problems and no reason can be found for the pain. If the pain becomes worse or does not go away, it may be necessary to repeat tests or do additional testing. Your caregiver may need to look further for a possible cause. SEEK IMMEDIATE MEDICAL CARE IF:  You have pain that is getting worse and is not relieved by medications.  You develop chest pain that is associated with shortness or breath, sweating, feeling sick to your stomach (nauseous), or throw up (vomit).  Your pain becomes localized to the abdomen.  You develop any new symptoms that seem different or that concern you. MAKE SURE YOU:   Understand these instructions.  Will watch your condition.  Will get help right away if you are not doing well or get worse.   This information is not intended to replace advice given to you by your health care provider. Make sure you discuss any questions you have with your health care provider.   Document Released: 04/03/2005 Document Revised: 06/26/2011 Document Reviewed: 12/06/2012 Elsevier Interactive Patient Education [employer]

## 2015-02-26 NOTE — Progress Notes (Signed)
[name], Aide ([id number]) Visit Report for 02/24/2015 Chief Complaint Document Details Patient Name: [name], [name] Date of Service: 02/24/2015 9:15 AM Medical Record Patient Account Number: [id number] Number: Treating RN: Curtis SitesDorthy, Joanna 10-Dec-1965 (49 y.o. Other Clinician: Date of Birth/Sex: Female) Treating BURNS III, Primary Care Physician: Yves DillKhan, Neelam Physician/Extender: Zollie BeckersWALTER Referring Physician: Blain PaisKhan, Neelam Weeks in Treatment: 5 Information Obtained from: Patient Chief Complaint Right calf ulcer, status post I and D of abscess. Electronic Signature(s) Signed: 02/24/2015 4:26:05 PM By: Madelaine BhatBurns, III, Walter MD Entered By: Madelaine BhatBurns, III, [name] on 02/24/2015 12:09:06 [name], [name] ([id number]) -------------------------------------------------------------------------------- HPI Details Patient Name: [name], [name] Date of Service: 02/24/2015 9:15 AM Medical Record Patient Account Number: [id number] Number: Treating RN: Curtis SitesDorthy, Joanna 10-Dec-1965 (49 y.o. Other Clinician: Date of Birth/Sex: Female) Treating BURNS III, Primary Care Physician: Yves DillKhan, Neelam Physician/Extender: Zollie BeckersWALTER Referring Physician: Blain PaisKhan, Neelam Weeks in Treatment: 5 History of Present Illness HPI Description: Pleasant 49 year old with history of borderline personality disorder and CP. No history of diabetes or peripheral arterial disease. Right ABI 1.2. She developed an ulceration on her right anterior calf secondary to an insect bite. This progressed to an abscess, and she underwent I and D on 01/02/2015 by Dr. Michela PitcherEly. Cultures grew oxacillin sensitive staph aureus. She completed a course of clindamycin. She lives in a group home. Performing dressing changes with silver alginate. Using Ace wrap for edema control. Has declined compression bandage, Tubigrip, and punch biopsy. She returns to clinic for follow-up and is without new complaints. No significant pain. No fever or  chills. Minimal drainage. Electronic Signature(s) Signed: 02/24/2015 4:26:05 PM By: Madelaine BhatBurns, III, Walter MD Entered By: Madelaine BhatBurns, III, [name] on 02/24/2015 12:09:54 [name], [name] ([id number]) -------------------------------------------------------------------------------- Physical Exam Details Patient Name: [name], [name] Date of Service: 02/24/2015 9:15 AM Medical Record Patient Account Number: [id number] Number: Treating RN: Curtis SitesDorthy, Joanna 10-Dec-1965 (49 y.o. Other Clinician: Date of Birth/Sex: Female) Treating BURNS III, Primary Care Physician: Yves DillKhan, Neelam Physician/Extender: Zollie BeckersWALTER Referring Physician: Yves DillKhan, Neelam Weeks in Treatment: 5 Constitutional . Pulse regular. Respirations normal and unlabored. Afebrile. Marland Kitchen. Respiratory WNL. No retractions.. Cardiovascular Pedal Pulses WNL. Integumentary (Hair, Skin) .Marland Kitchen. Neurological Sensation normal to touch, pin,and vibration. Psychiatric . Oriented times 3.. . Notes Right anterior calf ulceration slightly larger. Full-thickness. Mild surrounding erythema. No residual abscess. Minimal edema. Palpable DP. Right ABI 1.2. Electronic Signature(s) Signed: 02/24/2015 4:26:05 PM By: Madelaine BhatBurns, III, Walter MD Entered By: Madelaine BhatBurns, III, [name] on 02/24/2015 12:10:35 [name], [name] ([id number]) -------------------------------------------------------------------------------- Physician Orders Details Patient Name: [name], [name] Date of Service: 02/24/2015 9:15 AM Medical Record Patient Account Number: [id number] Number: Treating RN: Curtis SitesDorthy, Joanna 10-Dec-1965 (49 y.o. Other Clinician: Date of Birth/Sex: Female) Treating BURNS III, Primary Care Physician: Yves DillKhan, Neelam Physician/Extender: Zollie BeckersWALTER Referring Physician: Blain PaisKhan, Neelam Weeks in Treatment: 5 Verbal / Phone Orders: Yes Clinician: Curtis Sitesorthy, Joanna Read Back and Verified: Yes Diagnosis Coding Wound Cleansing Wound #1 Right,Medial Lower Leg o Clean wound with  Normal Saline. o May Shower, gently pat wound dry prior to applying new dressing. Anesthetic Wound #1 Right,Medial Lower Leg o Topical Lidocaine 4% cream applied to wound bed prior to debridement Skin Barriers/Peri-Wound Care Wound #1 Right,Medial Lower Leg o Barrier cream - on peri-wound Primary Wound Dressing Wound #1 Right,Medial Lower Leg o Aquacel Ag Secondary Dressing Wound #1 Right,Medial Lower Leg o ABD and Kerlix/Conform o Dry Gauze Dressing Change Frequency Wound #1 Right,Medial Lower Leg o Change dressing every other day. - Three times weekly Follow-up Appointments Wound #1 Right,Medial Lower Leg o Return Appointment in 2  weeks. Edema Control Wound #1 Right,Medial Lower Leg o Tubigrip [name] ([id number]) Additional Orders / Instructions Wound #1 Right,Medial Lower Leg o Increase protein intake. o Activity as tolerated Home Health Wound #1 Right,Medial Lower Leg o [facility] Visits - [facility] o Home Health Nurse may visit PRN to address patientos wound care needs. o FACE TO FACE ENCOUNTER: MEDICARE and MEDICAID PATIENTS: I certify that this patient is under my care and that I had a face-to-face encounter that meets the physician face-to-face encounter requirements with this patient on this date. The encounter with the patient was in whole or in part for the following MEDICAL CONDITION: (primary reason for Home Healthcare) MEDICAL NECESSITY: I certify, that based on my findings, NURSING services are a medically necessary home health service. HOME BOUND STATUS: I certify that my clinical findings support that this patient is homebound (i.e., Due to illness or injury, pt requires aid of supportive devices such as crutches, cane, wheelchairs, walkers, the use of special transportation or the assistance of another person to leave their place of residence. There is a normal inability to leave the home and doing so  requires considerable and taxing effort. Other absences are for medical reasons / religious services and are infrequent or of short duration when for other reasons). o If current dressing causes regression in wound condition, may D/C ordered dressing product/s and apply Normal Saline Moist Dressing daily until next Wound Healing Center / Other MD appointment. Notify Wound Healing Center of regression in wound condition at [phone]. o Please direct any NON-WOUND related issues/requests for orders to patient's Primary Care Physician Consults o Plastic Surgery oooo Electronic Signature(s) Signed: 02/24/2015 4:26:05 PM By: Madelaine Bhat MD Signed: 02/25/2015 5:07:12 PM By: Curtis Sites Entered By: Curtis Sites on 02/24/2015 09:57:38 Sherri Donning ([id number]) -------------------------------------------------------------------------------- Problem List Details Patient Name: Sherri Donning Date of Service: 02/24/2015 9:15 AM Medical Record Patient Account Number: [id number] Number: Treating RN: Curtis Sites February 24, 1966 (49 y.o. 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 Other Clinician: Date of Birth/Sex: Female) Treating BURNS III, Primary Care Physician: Yves Dill Physician/Extender: Zollie Beckers Referring Physician: Blain Pais in Treatment: 5 Subjective Chief Complaint Information obtained from Patient Right calf ulcer, status post I and D of abscess. History of Present Illness (HPI) Pleasant 49 year old with history of borderline personality disorder and CP. No history of diabetes or peripheral arterial disease. Right ABI 1.2. She developed an ulceration on her right anterior calf secondary to an insect bite. This progressed to an abscess, and she underwent I and D on 01/02/2015 by Dr. Michela Pitcher. Cultures grew oxacillin sensitive staph aureus. She completed a course of clindamycin. She lives in a group home. Performing dressing changes with silver alginate. Using Ace wrap for edema control. Has declined compression bandage, Tubigrip, and punch biopsy. She returns to clinic for follow-up and  is without new complaints. No significant pain. No fever or chills. Minimal drainage. Objective Constitutional Pulse regular. Respirations normal and unlabored. Afebrile. Vitals Time Taken: 9:33 AM, Height: 64 in, Weight: 215 lbs, BMI: 36.9, Temperature: 98.8 F, Pulse: 75 bpm, Respiratory Rate: 18 breaths/min, Blood Pressure: 143/78 mmHg. Respiratory WNL. No retractions.. Cardiovascular Pedal Pulses WNL. TERRAH, DECOSTER ([id number]) Neurological Sensation normal to touch, pin,and vibration. Psychiatric Oriented times 3.. General Notes: Right anterior calf ulceration slightly larger. Full-thickness. Mild surrounding erythema. No residual abscess. Minimal edema. Palpable DP. Right ABI 1.2. Integumentary (Hair, Skin) Wound #1 status is Open. Original cause of wound was Bite. The wound is located on the Right,Medial Lower Leg.  The wound measures 3.6cm length x 3.3cm width x 0.2cm depth; 9.331cm^2 area and 1.866cm^3 volume. The wound is limited to skin breakdown. There is no tunneling or undermining noted. There is a large amount of serosanguineous drainage noted. The wound margin is flat and intact. There is large (67-100%) red granulation within the wound bed. There is a small (1-33%) amount of necrotic tissue within the wound bed including Adherent Slough. The periwound skin appearance exhibited: Localized Edema, Scarring, Maceration, Moist. The periwound skin appearance did not exhibit: Callus, Crepitus, Excoriation, Fluctuance, Friable, Induration, Rash, Dry/Scaly, Atrophie Blanche, Cyanosis, Ecchymosis, Hemosiderin Staining, Mottled, Pallor, Rubor, Erythema. Periwound temperature was noted as No Abnormality. The periwound has tenderness on palpation. Assessment Active Problems ICD-10 L97.212 - Non-pressure chronic ulcer of right calf with fat layer exposed S81.801S - Unspecified open wound, right lower leg, sequela F60.3 - Borderline personality disorder Right anterior calf ulceration, status post I and D of abscess. Plan Wound Cleansing: Wound #1 Right,Medial Lower Leg: Clean wound with Normal Saline. May Shower, gently pat wound dry prior to applying new dressing. KENNEDEE, KITZMILLER ([id number]) Anesthetic: Wound #1 Right,Medial Lower Leg: Topical Lidocaine 4% cream applied to wound bed prior to debridement Skin Barriers/Peri-Wound Care: Wound #1 Right,Medial Lower Leg: Barrier cream - on peri-wound Primary Wound Dressing: Wound #1 Right,Medial Lower Leg: Aquacel Ag Secondary Dressing: Wound #1 Right,Medial Lower Leg: ABD and Kerlix/Conform Dry Gauze Dressing Change Frequency: Wound #1 Right,Medial Lower Leg: Change dressing every other day. - Three times weekly Follow-up Appointments: Wound #1 Right,Medial Lower Leg: Return Appointment in 2 weeks. Edema Control: Wound #1 Right,Medial Lower  Leg: Tubigrip Additional Orders / Instructions: Wound #1 Right,Medial Lower Leg: Increase protein intake. Activity as tolerated Home Health: Wound #1 Right,Medial Lower Leg: [facility] Visits - [facility] Home Health Nurse may visit PRN to address patient s wound care needs. FACE TO FACE ENCOUNTER: MEDICARE and MEDICAID PATIENTS: I certify that this patient is under my care and that I had a face-to-face encounter that meets the physician face-to-face encounter requirements with this patient on this date. The encounter with the patient was in whole or in part for the following MEDICAL CONDITION: (primary reason for Home Healthcare) MEDICAL NECESSITY: I certify, that based on my findings, NURSING services are a medically necessary home health service. HOME BOUND STATUS: I certify that my clinical findings support that this patient is homebound (i.e., Due to illness or injury, pt requires aid of supportive devices such as crutches, cane, wheelchairs, walkers, the use of special transportation or the assistance of another person to leave their place of residence. There is a normal inability to leave the home and doing so requires considerable and taxing effort. Other absences are for medical reasons / religious services and are infrequent or of short duration when for other reasons).  If current dressing causes regression in wound condition, may D/C ordered dressing product/s and apply Normal Saline Moist Dressing daily until next Wound Healing Center / Other MD appointment. Notify Wound Healing Center of regression in wound condition at [phone]. Please direct any NON-WOUND related issues/requests for orders to patient's Primary Care Physician Consults ordered were: Plastic Surgery KAHLIYAH, DICK ([id number]) Continue with silver alginate dressing changes and Ace wrap for edema control. She does not light Tubigrip's. Declined compression bandage. Decline punch biopsy. Wishes  to undergo plastic surgery consultation for consideration of skin graft. Electronic Signature(s) Signed: 02/24/2015 4:26:05 PM By: Madelaine Bhat MD Entered By: Madelaine Bhat on 02/24/2015 12:11:38 Sherri Donning ([id number]) -------------------------------------------------------------------------------- SuperBill Details Patient Name: Sherri Donning Date of Service: 02/24/2015 Medical Record Patient Account Number: [id number] Number: Treating RN: Curtis Sites May 23, 1965 (49 y.o. Other Clinician: Date of Birth/Sex: Female) Treating BURNS III, Primary Care Physician: Yves Dill Physician/Extender: Zollie Beckers Referring Physician: Yves Dill Weeks in Treatment: 5 Diagnosis Coding ICD-10 Codes Code Description [phone] Non-pressure chronic ulcer of right calf with fat layer exposed S81.801S Unspecified open wound, right lower leg, sequela F60.3 Borderline personality disorder Facility Procedures CPT4 Code: [id number] Description: 99213 - WOUND CARE VISIT-LEV 3 EST PT Modifier: Quantity: 1 Physician Procedures CPT4 Code: [id number] Description: 99213 - WC PHYS LEVEL 3 - EST PT ICD-10 Description Diagnosis L97.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 with fat Modifier: layer exposed Quantity: 1 Electronic Signature(s) Signed: 02/24/2015 4:26:05 PM By: Madelaine Bhat MD Entered By: Madelaine Bhat on 02/24/2015 12:11:52

## 2015-02-26 NOTE — ED Notes (Signed)
Pt given sandwich tray and drink. 

## 2015-02-26 NOTE — Progress Notes (Signed)
[name], [name] ([id number]) Visit Report for 02/24/2015 Arrival Information Details Patient Name: [name], [name] Date of Service: 02/24/2015 9:15 AM Medical Record Number: [id number] Patient Account Number: [id number] Date of Birth/Sex: 11-Mar-1966 (49 y.o. Female) Treating RN: Curtis Sites Primary Care Physician: Yves Dill Other Clinician: Referring Physician: Yves Dill Treating Physician/Extender: BURNS III, Regis Bill in Treatment: 5 Visit Information History Since Last Visit Added or deleted any medications: No Patient Arrived: Ambulatory Any new allergies or adverse reactions: No Arrival Time: 09:32 Had a fall or experienced change in No Accompanied By: staff activities of daily living that may affect Transfer Assistance: None risk of falls: Patient Identification Verified: Yes Signs or symptoms of abuse/neglect since last No Secondary Verification Process Yes visito Completed: Hospitalized since last visit: No Pain Present Now: Yes Electronic Signature(s) Signed: 02/25/2015 5:07:12 PM By: Curtis Sites Entered By: Curtis Sites on 02/24/2015 09:32:50 [name] ([id number]) -------------------------------------------------------------------------------- Clinic Level of Care Assessment Details Patient Name: [name] Date of Service: 02/24/2015 9:15 AM Medical Record Number: [id number] Patient Account Number: [id number] Date of Birth/Sex: 28-Feb-1966 (49 y.o. Female) Treating RN: Curtis Sites Primary Care Physician: Yves Dill Other Clinician: Referring Physician: Yves Dill Treating Physician/Extender: BURNS III, Regis Bill in Treatment: 5 Clinic Level of Care Assessment Items TOOL 4 Quantity Score  - Use when only an EandM is performed on FOLLOW-UP visit 0 ASSESSMENTS - Nursing Assessment / Reassessment X - Reassessment of Co-morbidities (includes updates in patient status) 1 10 X - Reassessment of Adherence to Treatment Plan 1  5 ASSESSMENTS - Wound and Skin Assessment / Reassessment X - Simple Wound Assessment / Reassessment - one wound 1 5  - Complex Wound Assessment / Reassessment - multiple wounds 0  - Dermatologic / Skin Assessment (not related to wound area) 0 ASSESSMENTS - Focused Assessment  - Circumferential Edema Measurements - multi extremities 0  - Nutritional Assessment / Counseling / Intervention 0 X - Lower Extremity Assessment (monofilament, tuning fork, pulses) 1 5  - Peripheral Arterial Disease Assessment (using hand held doppler) 0 ASSESSMENTS - Ostomy and/or Continence Assessment and Care  - Incontinence Assessment and Management 0  - Ostomy Care Assessment and Management (repouching, etc.) 0 PROCESS - Coordination of Care X - Simple Patient / Family Education for ongoing care 1 15  - Complex (extensive) Patient / Family Education for ongoing care 0  - Staff obtains Chiropractor, Records, Test Results / Process Orders 0  - Staff telephones HHA, Nursing Homes / Clarify orders / etc 0  - Routine Transfer to another Facility (non-emergent condition) 0 [place], [place] ([id number])  - Routine Hospital Admission (non-emergent condition) 0  - New Admissions / Manufacturing engineer / Ordering NPWT, Apligraf, etc. 0  - Emergency Hospital Admission (emergent condition) 0 X - Simple Discharge Coordination 1 10  - Complex (extensive) Discharge Coordination 0 PROCESS - Special Needs  - Pediatric / Minor Patient Management 0  - Isolation Patient Management 0  - Hearing / Language / Visual special needs 0  - Assessment of Community assistance (transportation, D/C planning, etc.) 0  - Additional assistance / Altered mentation 0  - Support Surface(s) Assessment (bed, cushion, seat, etc.) 0 INTERVENTIONS - Wound Cleansing / Measurement X - Simple Wound Cleansing - one wound 1 5  - Complex Wound Cleansing - multiple wounds 0 X - Wound Imaging (photographs - any  number of wounds) 1 5  - Wound Tracing (instead of photographs) 0 X - Simple Wound Measurement - one wound 1 5  - Complex  Wound Measurement - multiple wounds 0 INTERVENTIONS - Wound Dressings X - Small Wound Dressing one or multiple wounds 1 10 []  - Medium Wound Dressing one or multiple wounds 0 []  - Large Wound Dressing one or multiple wounds 0 []  - Application of Medications - topical 0 []  - Application of Medications - injection 0 INTERVENTIONS - Miscellaneous []  - External ear exam 0 [place], Kasmira ([id number]) []  - Specimen Collection (cultures, biopsies, blood, body fluids, etc.) 0 []  - Specimen(s) / Culture(s) sent or taken to Lab for analysis 0 []  - Patient Transfer (multiple staff / Michiel SitesHoyer Lift / Similar devices) 0 []  - Simple Staple / Suture removal (25 or less) 0 []  - Complex Staple / Suture removal (26 or more) 0 []  - Hypo / Hyperglycemic Management (close monitor of Blood Glucose) 0 []  - Ankle / Brachial Index (ABI) - do not check if billed separately 0 X - Vital Signs 1 5 Has the patient been seen at the hospital within the last three years: Yes Total Score: 80 Level Of Care: New/Established - Level 3 Electronic Signature(s) Signed: 02/25/2015 5:07:12 PM By: Curtis Sitesorthy, Joanna Entered By: Curtis Sitesorthy, Joanna on 02/24/2015 09:58:07 [name], [name] ([id number]) -------------------------------------------------------------------------------- Encounter Discharge Information Details Patient Name: [name], [name] Date of Service: 02/24/2015 9:15 AM Medical Record Number: [id number] Patient Account Number: [id number] Date of Birth/Sex: 10-Jun-1965 (49 y.o. Female) Treating RN: Curtis Sitesorthy, Joanna Primary Care Physician: Yves DillKhan, Neelam Other Clinician: Referring Physician: Yves DillKhan, Neelam Treating Physician/Extender: BURNS III, Regis BillWALTER Weeks in Treatment: 5 Encounter Discharge Information Items Discharge Pain Level: 0 Discharge Condition: Stable Ambulatory Status:  Ambulatory Discharge Destination: Home Transportation: Private Auto Accompanied By: staff Schedule Follow-up Appointment: Yes Medication Reconciliation completed and provided to Patient/Care No Aloma Boch: Provided on Clinical Summary of Care: 02/24/2015 Form Type Recipient Paper Patient [place] Electronic Signature(s) Signed: 02/24/2015 10:06:22 AM By: Gwenlyn PerkingMoore, Shelia Entered By: Gwenlyn PerkingMoore, Shelia on 02/24/2015 10:06:22 [name], [name] ([id number]) -------------------------------------------------------------------------------- Lower Extremity Assessment Details Patient Name: [name], [name] Date of Service: 02/24/2015 9:15 AM Medical Record Number: [id number] Patient Account Number: [id number] Date of Birth/Sex: 10-Jun-1965 (49 y.o. Female) Treating RN: Curtis Sitesorthy, Joanna Primary Care Physician: Yves DillKhan, Neelam Other Clinician: Referring Physician: Yves DillKhan, Neelam Treating Physician/Extender: BURNS III, Regis BillWALTER Weeks in Treatment: 5 Vascular Assessment Pulses: Posterior Tibial Dorsalis Pedis Palpable: [Right:Yes] Extremity colors, hair growth, and conditions: Extremity Color: [Right:Normal] Hair Growth on Extremity: [Right:Yes] Temperature of Extremity: [Right:Warm] Capillary Refill: [Right:< 3 seconds] Electronic Signature(s) Signed: 02/25/2015 5:07:12 PM By: Curtis Sitesorthy, Joanna Entered By: Curtis Sitesorthy, Joanna on 02/24/2015 09:45:13 [name], [name] ([id number]) -------------------------------------------------------------------------------- Multi Wound Chart Details Patient Name: [name], [name] Date of Service: 02/24/2015 9:15 AM Medical Record Number: [id number] Patient Account Number: [id number] Date of Birth/Sex: 10-Jun-1965 (49 y.o. Female) Treating RN: Curtis Sitesorthy, Joanna Primary Care Physician: Yves DillKhan, Neelam Other Clinician: Referring Physician: Yves DillKhan, Neelam Treating Physician/Extender: BURNS III, Regis BillWALTER Weeks in Treatment: 5 Vital Signs Height(in): 64 Pulse(bpm): 75 Weight(lbs): 215 Blood  Pressure 143/78 (mmHg): Body Mass Index(BMI): 37 Temperature(F): 98.8 Respiratory Rate 18 (breaths/min): Photos: [1:No Photos] [N/A:N/A] Wound Location: [1:Right Lower Leg - Medial] [N/A:N/A] Wounding Event: [1:Bite] [N/A:N/A] Primary Etiology: [1:Infection - not elsewhere classified] [N/A:N/A] Date Acquired: [1:12/28/2014] [N/A:N/A] Weeks of Treatment: [1:5] [N/A:N/A] Wound Status: [1:Open] [N/A:N/A] Measurements L x W x D 3.6x3.3x0.2 [N/A:N/A] (cm) Area (cm) : [1:9.331] [N/A:N/A] Volume (cm) : [1:1.866] [N/A:N/A] % Reduction in Area: [1:-58.40%] [N/A:N/A] % Reduction in Volume: -216.80% [N/A:N/A] Classification: [1:Full Thickness Without Exposed Support Structures] [N/A:N/A] Exudate Amount: [1:Large] [N/A:N/A] Exudate Type: [1:Serosanguineous] [N/A:N/A] Exudate Color: [1:red, brown] [N/A:N/A] Wound Margin: [  1:Flat and Intact] [N/A:N/A] Granulation Amount: [1:Large (67-100%)] [N/A:N/A] Granulation Quality: [1:Red] [N/A:N/A] Necrotic Amount: [1:Small (1-33%)] [N/A:N/A] Exposed Structures: [1:Fascia: No Fat: No Tendon: No Muscle: No Joint: No Bone: No] [N/A:N/A] Limited to Skin Breakdown Epithelialization: Small (1-33%) N/A N/A Periwound Skin Texture: Edema: Yes N/A N/A Scarring: Yes Excoriation: No Induration: No Callus: No Crepitus: No Fluctuance: No Friable: No Rash: No Periwound Skin Maceration: Yes N/A N/A Moisture: Moist: Yes Dry/Scaly: No Periwound Skin Color: Atrophie Blanche: No N/A N/A Cyanosis: No Ecchymosis: No Erythema: No Hemosiderin Staining: No Mottled: No Pallor: No Rubor: No Temperature: No Abnormality N/A N/A Tenderness on Yes N/A N/A Palpation: Wound Preparation: Ulcer Cleansing: N/A N/A Rinsed/Irrigated with Saline Topical Anesthetic Applied: Other: lidocaine 4% Treatment Notes Electronic Signature(s) Signed: 02/25/2015 5:07:12 PM By: Curtis Sites Entered By: Curtis Sites on 02/24/2015 09:46:37 [name]  ([id number]) -------------------------------------------------------------------------------- Multi-Disciplinary Care Plan Details Patient Name: [name] Date of Service: 02/24/2015 9:15 AM Medical Record Number: [id number] Patient Account Number: [id number] Date of Birth/Sex: 1965-10-19 (49 y.o. Female) Treating RN: Curtis Sites Primary Care Physician: Yves Dill Other Clinician: Referring Physician: Yves Dill Treating Physician/Extender: BURNS III, Regis Bill in Treatment: 5 Active Inactive Orientation to the Wound Care Program Nursing Diagnoses: Knowledge deficit related to the wound healing center program Goals: Patient/caregiver will verbalize understanding of the Wound Healing Center Program Date Initiated: 01/20/2015 Goal Status: Active Interventions: Provide education on orientation to the wound center Notes: Soft Tissue Infection Nursing Diagnoses: Potential for infection: soft tissue Goals: Patient will remain free of wound infection Date Initiated: 01/20/2015 Goal Status: Active Interventions: Assess signs and symptoms of infection every visit Treatment Activities: Culture and sensitivity : 02/24/2015 Notes: Wound/Skin Impairment Nursing Diagnoses: Impaired tissue integrity ZEENA, STARKEL ([id number]) Goals: Ulcer/skin breakdown will heal within 14 weeks Date Initiated: 01/20/2015 Goal Status: Active Interventions: Assess patient/caregiver ability to obtain necessary supplies Notes: Electronic Signature(s) Signed: 02/25/2015 5:07:12 PM By: Curtis Sites Entered By: Curtis Sites on 02/24/2015 09:46:29 [name] ([id number]) -------------------------------------------------------------------------------- Pain Assessment Details Patient Name: [name] Date of Service: 02/24/2015 9:15 AM Medical Record Number: [id number] Patient Account Number: [id number] Date of Birth/Sex: 1965-06-13 (49 y.o. Female) Treating RN: Curtis Sites Primary Care Physician: Yves Dill Other Clinician: Referring Physician: Yves Dill Treating Physician/Extender: BURNS III, Regis Bill in Treatment: 5 Active Problems Location of Pain Severity and Description of Pain Patient Has Paino Yes Site Locations Pain Location: Pain in Ulcers With Dressing Change: Yes Duration of the Pain. Constant / Intermittento Constant Pain Management and Medication Current Pain Management: Electronic Signature(s) Signed: 02/25/2015 5:07:12 PM By: Curtis Sites Entered By: Curtis Sites on 02/24/2015 09:33:05 [name] ([id number]) -------------------------------------------------------------------------------- Patient/Caregiver Education Details Patient Name: [name] Date of Service: 02/24/2015 9:15 AM Medical Record Number: [id number] Patient Account Number: [id number] Date of Birth/Gender: December 28, 1965 (49 y.o. Female) Treating RN: Curtis Sites Primary Care Physician: Yves Dill Other Clinician: Referring Physician: Yves Dill Treating Physician/Extender: BURNS III, Regis Bill in Treatment: 5 Education Assessment Education Provided To: Patient Education Topics Provided Wound/Skin Impairment: Handouts: Other: wound care as ordered Methods: Demonstration, Explain/Verbal Responses: State content correctly Electronic Signature(s) Signed: 02/25/2015 5:07:12 PM By: Curtis Sites Entered By: Curtis Sites on 02/24/2015 09:50:29 [name] ([id number]) -------------------------------------------------------------------------------- Wound Assessment Details Patient Name: [name] Date of Service: 02/24/2015 9:15 AM Medical Record Number: [id number] Patient Account Number: [id number] Date of Birth/Sex: Dec 25, 1965 (49 y.o. Female) Treating RN: Curtis Sites Primary Care Physician: Yves Dill Other Clinician: Referring Physician: Yves Dill Treating Physician/Extender: BURNS III,  Regis Bill  in Treatment: 5 Wound Status Wound Number: 1 Primary Etiology: Infection - not elsewhere classified Wound Location: Right Lower Leg - Medial Wound Status: Open Wounding Event: Bite Date Acquired: 12/28/2014 Weeks Of Treatment: 5 Clustered Wound: No Photos Photo Uploaded By: Curtis Sites on 02/24/2015 11:52:26 Wound Measurements Length: (cm) 3.6 Width: (cm) 3.3 Depth: (cm) 0.2 Area: (cm) 9.331 Volume: (cm) 1.866 % Reduction in Area: -58.4% % Reduction in Volume: -216.8% Epithelialization: Small (1-33%) Tunneling: No Undermining: No Wound Description Full Thickness Without Exposed Classification: Support Structures Wound Margin: Flat and Intact Exudate Large Amount: Exudate Type: Serosanguineous Exudate Color: red, brown Foul Odor After Cleansing: No Wound Bed Granulation Amount: Large (67-100%) Exposed Structure Granulation Quality: Red Fascia Exposed: No Necrotic Amount: Small (1-33%) Fat Layer Exposed: No [place], Kylene ([id number]) Necrotic Quality: Adherent Slough Tendon Exposed: No Muscle Exposed: No Joint Exposed: No Bone Exposed: No Limited to Skin Breakdown Periwound Skin Texture Texture Color No Abnormalities Noted: No No Abnormalities Noted: No Callus: No Atrophie Blanche: No Crepitus: No Cyanosis: No Excoriation: No Ecchymosis: No Fluctuance: No Erythema: No Friable: No Hemosiderin Staining: No Induration: No Mottled: No Localized Edema: Yes Pallor: No Rash: No Rubor: No Scarring: Yes Temperature / Pain Moisture Temperature: No Abnormality No Abnormalities Noted: No Tenderness on Palpation: Yes Dry / Scaly: No Maceration: Yes Moist: Yes Wound Preparation Ulcer Cleansing: Rinsed/Irrigated with Saline Topical Anesthetic Applied: Other: lidocaine 4%, Treatment Notes Wound #1 (Right, Medial Lower Leg) 1. Cleansed with: Clean wound with Normal Saline 2. Anesthetic Topical Lidocaine 4% cream to wound bed prior  to debridement 4. Dressing Applied: Aquacel Ag 5. Secondary Dressing Applied ABD and Kerlix/Conform 7. Secured with Tubigrip Electronic Signature(s) Signed: 02/25/2015 5:07:12 PM By: Curtis Sites Entered By: Curtis Sites on 02/24/2015 09:46:21 [name] ([id number]) -------------------------------------------------------------------------------- Vitals Details Patient Name: [name] Date of Service: 02/24/2015 9:15 AM Medical Record Number: [id number] Patient Account Number: [id number] Date of Birth/Sex: 01-16-1966 (49 y.o. Female) Treating RN: Curtis Sites Primary Care Physician: Yves Dill Other Clinician: Referring Physician: Yves Dill Treating Physician/Extender: BURNS III, Regis Bill in Treatment: 5 Vital Signs Time Taken: 09:33 Temperature (F): 98.8 Height (in): 64 Pulse (bpm): 75 Weight (lbs): 215 Respiratory Rate (breaths/min): 18 Body Mass Index (BMI): 36.9 Blood Pressure (mmHg): 143/78 Reference Range: 80 - 120 mg / dl Electronic Signature(s) Signed: 02/25/2015 5:07:12 PM By: Curtis Sites Entered By: Curtis Sites on 02/24/2015 09:36:01

## 2015-03-10 ENCOUNTER — Encounter: Payer: Medicare Other | Admitting: Surgery

## 2015-03-10 DIAGNOSIS — L97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 with fat layer exposed: Secondary | ICD-10-CM | POA: Diagnosis not present

## 2015-03-12 NOTE — Progress Notes (Signed)
MABEL, ROLL ([id number]) Visit Report for 03/10/2015 Arrival Information Details Patient Name: [name], [name] Date of Service: 03/10/2015 9:15 AM Medical Record Number: [id number] Patient Account Number: [id number] Date of Birth/Sex: [birth year] (49 y.o. Female) Treating RN: Curtis Sites Primary Care Physician: Yves Dill Other Clinician: Referring Physician: Yves Dill Treating Physician/Extender: BURNS III, Regis Bill in Treatment: 7 Visit Information History Since Last Visit Added or deleted any medications: No Patient Arrived: Ambulatory Any new allergies or adverse reactions: No Arrival Time: 09:20 Had a fall or experienced change in No Accompanied By: staff activities of daily living that may affect Transfer Assistance: None risk of falls: Patient Identification Verified: Yes Signs or symptoms of abuse/neglect since last No Secondary Verification Process Yes visito Completed: Hospitalized since last visit: No Pain Present Now: Yes Electronic Signature(s) Signed: 03/10/2015 5:07:12 PM By: Curtis Sites Entered By: Curtis Sites on 03/10/2015 09:23:13 [name] ([id number]) -------------------------------------------------------------------------------- Encounter Discharge Information Details Patient Name: [name] Date of Service: 03/10/2015 9:15 AM Medical Record Number: [id number] Patient Account Number: [id number] Date of Birth/Sex: February 24, 1966 (49 y.o. Female) Treating RN: Curtis Sites Primary Care Physician: Yves Dill Other Clinician: Referring Physician: Yves Dill Treating Physician/Extender: BURNS III, Regis Bill in Treatment: 7 Encounter Discharge Information Items Discharge Pain Level: 0 Discharge Condition: Stable Ambulatory Status: Ambulatory Discharge Destination: Home Transportation: Private Auto Accompanied By: staff Schedule Follow-up Appointment: Yes Medication Reconciliation completed and provided to  Patient/Care No Marcellius Montagna: Provided on Clinical Summary of Care: 03/10/2015 Form Type Recipient Paper Patient [place] Electronic Signature(s) Signed: 03/10/2015 10:15:39 AM By: Gwenlyn Perking Entered By: Gwenlyn Perking on 03/10/2015 10:15:39 [name] ([id number]) -------------------------------------------------------------------------------- Lower Extremity Assessment Details Patient Name: [name] Date of Service: 03/10/2015 9:15 AM Medical Record Number: [id number] Patient Account Number: [id number] Date of Birth/Sex: 28-Jan-1966 (49 y.o. Female) Treating RN: Curtis Sites Primary Care Physician: Yves Dill Other Clinician: Referring Physician: Yves Dill Treating Physician/Extender: BURNS III, Regis Bill in Treatment: 7 Edema Assessment Assessed: [Left: No] [Right: No] Edema: [Left: N] [Right: o] Vascular Assessment Pulses: Posterior Tibial Dorsalis Pedis Palpable: [Right:Yes] Extremity colors, hair growth, and conditions: Extremity Color: [Right:Normal] Hair Growth on Extremity: [Right:Yes] Temperature of Extremity: [Right:Warm] Capillary Refill: [Right:< 3 seconds] Toe Nail Assessment Left: Right: Thick: No Discolored: No Deformed: No Improper Length and Hygiene: No Electronic Signature(s) Signed: 03/10/2015 5:07:12 PM By: Curtis Sites Entered By: Curtis Sites on 03/10/2015 09:33:01 [name] ([id number]) -------------------------------------------------------------------------------- Multi Wound Chart Details Patient Name: [name] Date of Service: 03/10/2015 9:15 AM Medical Record Number: [id number] Patient Account Number: [id number] Date of Birth/Sex: [birth year] (49 y.o. Female) Treating RN: Curtis Sites Primary Care Physician: Yves Dill Other Clinician: Referring Physician: Yves Dill Treating Physician/Extender: BURNS III, Regis Bill in Treatment: 7 Vital Signs Height(in): 64 Pulse(bpm): 67 Weight(lbs):  215 Blood Pressure 120/74 (mmHg): Body Mass Index(BMI): 37 Temperature(F): 98.6 Respiratory Rate 18 (breaths/min): Photos: [1:No Photos] [N/A:N/A] Wound Location: [1:Right Lower Leg - Medial] [N/A:N/A] Wounding Event: [1:Bite] [N/A:N/A] Primary Etiology: [1:Infection - not elsewhere classified] [N/A:N/A] Date Acquired: [1:12/28/2014] [N/A:N/A] Weeks of Treatment: [1:7] [N/A:N/A] Wound Status: [1:Open] [N/A:N/A] Measurements L x W x D 3.6x3.5x0.1 [N/A:N/A] (cm) Area (cm) : [1:9.896] [N/A:N/A] Volume (cm) : [1:0.99] [N/A:N/A] % Reduction in Area: [1:-68.00%] [N/A:N/A] % Reduction in Volume: -68.10% [N/A:N/A] Classification: [1:Full Thickness Without Exposed Support Structures] [N/A:N/A] Exudate Amount: [1:Large] [N/A:N/A] Exudate Type: [1:Serosanguineous] [N/A:N/A] Exudate Color: [1:red, brown] [N/A:N/A] Wound Margin: [1:Flat and Intact] [N/A:N/A] Granulation Amount: [1:Large (67-100%)] [N/A:N/A] Granulation Quality: [1:Red] [N/A:N/A] Necrotic Amount: [1:Small (1-33%)] [N/A:N/A] Exposed Structures: [1:Fascia: No  Fat: No Tendon: No Muscle: No Joint: No Bone: No] [N/A:N/A] Limited to Skin Breakdown Epithelialization: Small (1-33%) N/A N/A Periwound Skin Texture: Edema: Yes N/A N/A Scarring: Yes Excoriation: No Induration: No Callus: No Crepitus: No Fluctuance: No Friable: No Rash: No Periwound Skin Maceration: Yes N/A N/A Moisture: Moist: Yes Dry/Scaly: No Periwound Skin Color: Atrophie Blanche: No N/A N/A Cyanosis: No Ecchymosis: No Erythema: No Hemosiderin Staining: No Mottled: No Pallor: No Rubor: No Temperature: No Abnormality N/A N/A Tenderness on Yes N/A N/A Palpation: Wound Preparation: Ulcer Cleansing: N/A N/A Rinsed/Irrigated with Saline Topical Anesthetic Applied: Other: lidocaine 4% Treatment Notes Electronic Signature(s) Signed: 03/10/2015 5:07:12 PM By: Curtis Sitesorthy, Joanna Entered By: Curtis Sitesorthy, Joanna on 03/10/2015 09:34:20 Sherri DonningSHUMATE, [name]  ([id number]) -------------------------------------------------------------------------------- Multi-Disciplinary Care Plan Details Patient Name: Sherri DonningSHUMATE, [name] Date of Service: 03/10/2015 9:15 AM Medical Record Number: [id number] Patient Account Number: [id number] Date of Birth/Sex: [birth year] (49 y.o. Female) Treating RN: Curtis Sitesorthy, Joanna Primary Care Physician: Yves DillKhan, Neelam Other Clinician: Referring Physician: Yves DillKhan, Neelam Treating Physician/Extender: BURNS III, Regis BillWALTER Weeks in Treatment: 7 Active Inactive Orientation to the Wound Care Program Nursing Diagnoses: Knowledge deficit related to the wound healing center program Goals: Patient/caregiver will verbalize understanding of the Wound Healing Center Program Date Initiated: 01/20/2015 Goal Status: Active Interventions: Provide education on orientation to the wound center Notes: Soft Tissue Infection Nursing Diagnoses: Potential for infection: soft tissue Goals: Patient will remain free of wound infection Date Initiated: 01/20/2015 Goal Status: Active Interventions: Assess signs and symptoms of infection every visit Treatment Activities: Culture and sensitivity : 03/10/2015 Notes: Wound/Skin Impairment Nursing Diagnoses: Impaired tissue integrity Sherri DonningSHUMATE, [name] ([id number]) Goals: Ulcer/skin breakdown will heal within 14 weeks Date Initiated: 01/20/2015 Goal Status: Active Interventions: Assess patient/caregiver ability to obtain necessary supplies Notes: Electronic Signature(s) Signed: 03/10/2015 5:07:12 PM By: Curtis Sitesorthy, Joanna Entered By: Curtis Sitesorthy, Joanna on 03/10/2015 09:34:13 Sherri DonningSHUMATE, [name] ([id number]) -------------------------------------------------------------------------------- Pain Assessment Details Patient Name: Sherri DonningSHUMATE, [name] Date of Service: 03/10/2015 9:15 AM Medical Record Number: [id number] Patient Account Number: [id number] Date of Birth/Sex: [birth year] (49 y.o. Female) Treating RN: Curtis Sitesorthy,  Joanna Primary Care Physician: Yves DillKhan, Neelam Other Clinician: Referring Physician: Yves DillKhan, Neelam Treating Physician/Extender: BURNS III, Regis BillWALTER Weeks in Treatment: 7 Active Problems Location of Pain Severity and Description of Pain Patient Has Paino Yes Site Locations Pain Location: Pain in Ulcers With Dressing Change: Yes Duration of the Pain. Constant / Intermittento Constant Pain Management and Medication Current Pain Management: Electronic Signature(s) Signed: 03/10/2015 5:07:12 PM By: Curtis Sitesorthy, Joanna Entered By: Curtis Sitesorthy, Joanna on 03/10/2015 09:23:24 Sherri DonningSHUMATE, [name] ([id number]) -------------------------------------------------------------------------------- Patient/Caregiver Education Details Patient Name: Sherri DonningSHUMATE, [name] Date of Service: 03/10/2015 9:15 AM Medical Record Number: [id number] Patient Account Number: [id number] Date of Birth/Gender: [birth year] (49 y.o. Female) Treating RN: Curtis Sitesorthy, Joanna Primary Care Physician: Yves DillKhan, Neelam Other Clinician: Referring Physician: Yves DillKhan, Neelam Treating Physician/Extender: BURNS III, Regis BillWALTER Weeks in Treatment: 7 Education Assessment Education Provided To: Patient Education Topics Provided Wound/Skin Impairment: Handouts: Other: wound care as ordered Methods: Demonstration, Explain/Verbal Responses: State content correctly Electronic Signature(s) Signed: 03/10/2015 5:07:12 PM By: Curtis Sitesorthy, Joanna Entered By: Curtis Sitesorthy, Joanna on 03/10/2015 10:13:19 Sherri DonningSHUMATE, [name] ([id number]) -------------------------------------------------------------------------------- Wound Assessment Details Patient Name: Sherri DonningSHUMATE, [name] Date of Service: 03/10/2015 9:15 AM Medical Record Number: [id number] Patient Account Number: [id number] Date of Birth/Sex: [birth year] (49 y.o. Female) Treating RN: Curtis Sitesorthy, Joanna Primary Care Physician: Yves DillKhan, Neelam Other Clinician: Referring Physician: Yves DillKhan, Neelam Treating Physician/Extender: BURNS III,  Regis BillWALTER Weeks in Treatment: 7 Wound Status Wound Number: 1 Primary Etiology: Infection - not elsewhere classified Wound Location: Right Lower Leg - Medial  Wound Status: Open Wounding Event: Bite Date Acquired: 12/28/2014 Weeks Of Treatment: 7 Clustered Wound: No Photos Photo Uploaded By: Curtis Sites on 03/10/2015 16:20:11 Wound Measurements Length: (cm) 3.6 Width: (cm) 3.5 Depth: (cm) 0.1 Area: (cm) 9.896 Volume: (cm) 0.99 % Reduction in Area: -68% % Reduction in Volume: -68.1% Epithelialization: Small (1-33%) Tunneling: No Undermining: No Wound Description Full Thickness Without Exposed Classification: Support Structures Wound Margin: Flat and Intact Exudate Large Amount: Exudate Type: Serosanguineous Exudate Color: red, brown Foul Odor After Cleansing: No Wound Bed Granulation Amount: Large (67-100%) Exposed Structure Granulation Quality: Red Fascia Exposed: No Necrotic Amount: Small (1-33%) Fat Layer Exposed: No [name], [name] ([id number]) Necrotic Quality: Adherent Slough Tendon Exposed: No Muscle Exposed: No Joint Exposed: No Bone Exposed: No Limited to Skin Breakdown Periwound Skin Texture Texture Color No Abnormalities Noted: No No Abnormalities Noted: No Callus: No Atrophie Blanche: No Crepitus: No Cyanosis: No Excoriation: No Ecchymosis: No Fluctuance: No Erythema: No Friable: No Hemosiderin Staining: No Induration: No Mottled: No Localized Edema: Yes Pallor: No Rash: No Rubor: No Scarring: Yes Temperature / Pain Moisture Temperature: No Abnormality No Abnormalities Noted: No Tenderness on Palpation: Yes Dry / Scaly: No Maceration: Yes Moist: Yes Wound Preparation Ulcer Cleansing: Rinsed/Irrigated with Saline Topical Anesthetic Applied: Other: lidocaine 4%, Treatment Notes Wound #1 (Right, Medial Lower Leg) 1. Cleansed with: Clean wound with Normal Saline 2. Anesthetic Topical Lidocaine 4% cream to wound bed prior to  debridement 3. Peri-wound Care: Barrier cream Skin Prep 4. Dressing Applied: Aquacel Ag 5. Secondary Dressing Applied Bordered Foam Dressing Electronic Signature(s) Signed: 03/10/2015 5:07:12 PM By: Curtis Sites Entered By: Curtis Sites on 03/10/2015 09:34:03 [name] ([id number]) [name], [name] ([id number]) -------------------------------------------------------------------------------- Vitals Details Patient Name: [name] Date of Service: 03/10/2015 9:15 AM Medical Record Number: [id number] Patient Account Number: [id number] Date of Birth/Sex: 1965-07-01 (49 y.o. Female) Treating RN: Curtis Sites Primary Care Physician: Yves Dill Other Clinician: Referring Physician: Yves Dill Treating Physician/Extender: BURNS III, Regis Bill in Treatment: 7 Vital Signs Time Taken: 09:25 Temperature (F): 98.6 Height (in): 64 Pulse (bpm): 67 Weight (lbs): 215 Respiratory Rate (breaths/min): 18 Body Mass Index (BMI): 36.9 Blood Pressure (mmHg): 120/74 Reference Range: 80 - 120 mg / dl Electronic Signature(s) Signed: 03/10/2015 5:07:12 PM By: Curtis Sites Entered By: Curtis Sites on 03/10/2015 09:26:07

## 2015-03-12 NOTE — Progress Notes (Signed)
[name], [name] ([id number]) Visit Report for 03/10/2015 Chief Complaint Document Details Patient Name: [name], [name] Date of Service: 03/10/2015 9:15 AM Medical Record Patient Account Number: [id number] Number: Treating RN: Curtis SitesDorthy, Joanna [birth year] (49 y.o. Other Clinician: Date of Birth/Sex: Female) Treating BURNS III, Primary Care Physician: Yves DillKhan, Neelam Physician/Extender: Zollie BeckersWALTER Referring Physician: Blain PaisKhan, Neelam Weeks in Treatment: 7 Information Obtained from: Patient Chief Complaint Right calf ulcer, status post I and D of abscess. Electronic Signature(s) Signed: 03/10/2015 3:24:44 PM By: Madelaine BhatBurns, III, Jennae Hakeem MD Entered By: Madelaine BhatBurns, III, Jordain Radin on 03/10/2015 11:59:07 [name], [name] ([id number]) -------------------------------------------------------------------------------- Debridement Details Patient Name: [name], [name] Date of Service: 03/10/2015 9:15 AM Medical Record Patient Account Number: [id number] Number: Treating RN: Curtis SitesDorthy, Joanna [birth year] (49 y.o. Other Clinician: Date of Birth/Sex: Female) Treating BURNS III, Primary Care Physician: Yves DillKhan, Neelam Physician/Extender: Zollie BeckersWALTER Referring Physician: Blain PaisKhan, Neelam Weeks in Treatment: 7 Debridement Performed for Wound #1 Right,Medial Lower Leg Assessment: Performed By: Physician BURNS III, Melanie CrazierWALTER W., MD Debridement: Debridement Pre-procedure Yes Verification/Time Out Taken: Start Time: 09:57 Pain Control: Lidocaine 4% Topical Solution Level: Skin/Subcutaneous Tissue Total Area Debrided (L x 3.6 (cm) x 3.5 (cm) = 12.6 (cm) W): Tissue and other Viable, Non-Viable, Fat, Fibrin/Slough, Subcutaneous material debrided: Instrument: Curette Bleeding: Minimum Hemostasis Achieved: Pressure End Time: 09:58 Procedural Pain: 0 Post Procedural Pain: 0 Response to Treatment: Procedure was tolerated well Post Debridement Measurements of Total Wound Length: (cm) 3.6 Width: (cm)  3.5 Depth: (cm) 0.2 Volume: (cm) 1.979 Post Procedure Diagnosis Same as Pre-procedure Electronic Signature(s) Signed: 03/10/2015 3:24:44 PM By: Madelaine BhatBurns, III, Tenlee Wollin MD Signed: 03/10/2015 5:07:12 PM By: Curtis Sitesorthy, Joanna Entered By: Madelaine BhatBurns, III, Jamesetta Greenhalgh on 03/10/2015 11:58:55 [name], [name] ([id number]) -------------------------------------------------------------------------------- HPI Details Patient Name: [name], [name] Date of Service: 03/10/2015 9:15 AM Medical Record Patient Account Number: [id number] Number: Treating RN: Curtis SitesDorthy, Joanna [birth year] (49 y.o. Other Clinician: Date of Birth/Sex: Female) Treating BURNS III, Primary Care Physician: Yves DillKhan, Neelam Physician/Extender: Zollie BeckersWALTER Referring Physician: Blain PaisKhan, Neelam Weeks in Treatment: 7 History of Present Illness HPI Description: Pleasant 49 year old with history of borderline personality disorder and CP. No history of diabetes or peripheral arterial disease. Right ABI 1.2. She developed an ulceration on her right anterior calf secondary to an insect bite. This progressed to an abscess, and she underwent I and D on 01/02/2015 by Dr. Michela PitcherEly. Cultures grew oxacillin sensitive staph aureus. She completed a course of clindamycin. She lives in a group home. Performing dressing changes with silver alginate. Using Ace wrap for edema control. Has declined compression bandage, Tubigrip, and punch biopsy. She returns to clinic for follow-up and is without new complaints. No significant pain. No fever or chills. Minimal drainage. Electronic Signature(s) Signed: 03/10/2015 3:24:44 PM By: Madelaine BhatBurns, III, Jazzmin Newbold MD Entered By: Madelaine BhatBurns, III, Alka Falwell on 03/10/2015 11:59:32 [name], [name] ([id number]) -------------------------------------------------------------------------------- Physical Exam Details Patient Name: [name], [name] Date of Service: 03/10/2015 9:15 AM Medical Record Patient Account Number:  [id number] Number: Treating RN: Curtis SitesDorthy, Joanna [birth year] (49 y.o. Other Clinician: Date of Birth/Sex: Female) Treating BURNS III, Primary Care Physician: Yves DillKhan, Neelam Physician/Extender: Zollie BeckersWALTER Referring Physician: Yves DillKhan, Neelam Weeks in Treatment: 7 Constitutional . Pulse regular. Respirations normal and unlabored. Afebrile. . Notes Right anterior calf ulceration unchanged. Full-thickness. Mild surrounding erythema. No residual abscess. Minimal edema. Palpable DP. Right ABI 1.2. Electronic Signature(s) Signed: 03/10/2015 3:24:44 PM By: Madelaine BhatBurns, III, Rylea Selway MD Entered By: Madelaine BhatBurns, III, Shaquoia Miers on 03/10/2015 12:00:11 [name], [name] ([id number]) -------------------------------------------------------------------------------- Physician Orders Details Patient Name: [name], [name] Date of Service: 03/10/2015 9:15 AM Medical Record Patient  Account Number: [id number] Number: Treating RN: Curtis Sites Mar 13, 1966 (49 y.o. Other Clinician: Date of Birth/Sex: Female) Treating BURNS III, Primary Care Physician: Yves Dill Physician/Extender: Zollie Beckers Referring Physician: Blain Pais in Treatment: 7 Verbal / Phone Orders: Yes Clinician: Curtis Sites Read Back and Verified: Yes Diagnosis Coding Wound Cleansing Wound #1 Right,Medial Lower Leg o Clean wound with Normal Saline. o May Shower, gently pat wound dry prior to applying new dressing. Anesthetic Wound #1 Right,Medial Lower Leg o Topical Lidocaine 4% cream applied to wound bed prior to debridement Skin Barriers/Peri-Wound Care Wound #1 Right,Medial Lower Leg o Barrier cream - on peri-wound Primary Wound Dressing Wound #1 Right,Medial Lower Leg o Aquacel Ag Secondary Dressing Wound #1 Right,Medial Lower Leg o ABD and Kerlix/Conform o Dry Gauze Dressing Change Frequency Wound #1 Right,Medial Lower Leg o Change dressing every other day. - Three times weekly Follow-up  Appointments Wound #1 Right,Medial Lower Leg o Return Appointment in 2 weeks. Edema Control Wound #1 Right,Medial Lower Leg o Tubigrip - or ace wrap Po, [place] ([id number]) Additional Orders / Instructions Wound #1 Right,Medial Lower Leg o Increase protein intake. o Activity as tolerated Home Health Wound #1 Right,Medial Lower Leg o Continue Home Health Visits - [facility] to visit patient three times week of 03/15/15 and two times week of 03/22/15 o Home Health Nurse may visit PRN to address patientos wound care needs. o FACE TO FACE ENCOUNTER: MEDICARE and MEDICAID PATIENTS: I certify that this patient is under my care and that I had a face-to-face encounter that meets the physician face-to-face encounter requirements with this patient on this date. The encounter with the patient was in whole or in part for the following MEDICAL CONDITION: (primary reason for Home Healthcare) MEDICAL NECESSITY: I certify, that based on my findings, NURSING services are a medically necessary home health service. HOME BOUND STATUS: I certify that my clinical findings support that this patient is homebound (i.e., Due to illness or injury, pt requires aid of supportive devices such as crutches, cane, wheelchairs, walkers, the use of special transportation or the assistance of another person to leave their place of residence. There is a normal inability to leave the home and doing so requires considerable and taxing effort. Other absences are for medical reasons / religious services and are infrequent or of short duration when for other reasons). o If current dressing causes regression in wound condition, may D/C ordered dressing product/s and apply Normal Saline Moist Dressing daily until next Wound Healing Center / Other MD appointment. Notify Wound Healing Center of regression in wound condition at [phone]. o Please direct any NON-WOUND related issues/requests for  orders to patient's Primary Care Physician Electronic Signature(s) Signed: 03/10/2015 3:24:44 PM By: Madelaine Bhat MD Signed: 03/10/2015 5:07:12 PM By: Curtis Sites Entered By: Curtis Sites on 03/10/2015 10:01:30 [name] ([id number]) -------------------------------------------------------------------------------- Problem List Details Patient Name: [name] Date of Service: 03/10/2015 9:15 AM Medical Record Patient Account Number: [id number] Number: Treating RN: Curtis Sites September 26, 1965 (49 y.o. 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 S81.801S Unspecified open wound, right lower leg, sequela 01/20/2015 Yes F60.3 Borderline personality disorder 01/20/2015 Yes Inactive Problems Resolved Problems Electronic Signature(s) Signed: 03/10/2015 3:24:44 PM By: Madelaine Bhat MD Entered By: Reino Bellis,  Michaelia Beilfuss on 03/10/2015 11:58:36 SHAWNELLE, SPOERL ([id number]) -------------------------------------------------------------------------------- Progress Note Details Patient Name: [name], [name] Date of Service: 03/10/2015 9:15 AM Medical Record Patient Account Number: [id number] Number: Treating RN: Curtis Sites [birth year] (49 y.o. Other Clinician: Date of Birth/Sex: Female) Treating BURNS III, Primary Care Physician: Yves Dill Physician/Extender: Zollie Beckers Referring Physician: Blain Pais in Treatment: 7 Subjective Chief Complaint Information obtained from Patient Right calf ulcer, status post I and D of abscess. History of Present Illness (HPI) Pleasant 49 year old with history of borderline personality disorder and CP.  No history of diabetes or peripheral arterial disease. Right ABI 1.2. She developed an ulceration on her right anterior calf secondary to an insect bite. This progressed to an abscess, and she underwent I and D on 01/02/2015 by Dr. Michela Pitcher. Cultures grew oxacillin sensitive staph aureus. She completed a course of clindamycin. She lives in a group home. Performing dressing changes with silver alginate. Using Ace wrap for edema control. Has declined compression bandage, Tubigrip, and punch biopsy. She returns to clinic for follow-up and is without new complaints. No significant pain. No fever or chills. Minimal drainage. Objective Constitutional Pulse regular. Respirations normal and unlabored. Afebrile. Vitals Time Taken: 9:25 AM, Height: 64 in, Weight: 215 lbs, BMI: 36.9, Temperature: 98.6 F, Pulse: 67 bpm, Respiratory Rate: 18 breaths/min, Blood Pressure: 120/74 mmHg. General Notes: Right anterior calf ulceration unchanged. Full-thickness. Mild surrounding erythema. No residual abscess. Minimal edema. Palpable DP. Right ABI 1.2. Integumentary (Hair, Skin) Wound #1 status is Open. Original cause of wound was Bite. The wound is located on the [name], [place] ([id number]) Lower Leg. The wound measures 3.6cm length x 3.5cm width x 0.1cm depth; 9.896cm^2 area and 0.99cm^3 volume. The wound is limited to skin breakdown. There is no tunneling or undermining noted. There is a large amount of serosanguineous drainage noted. The wound margin is flat and intact. There is large (67-100%) red granulation within the wound bed. There is a small (1-33%) amount of necrotic tissue within the wound bed including Adherent Slough. The periwound skin appearance exhibited: Localized Edema, Scarring, Maceration, Moist. The periwound skin appearance did not exhibit: Callus, Crepitus, Excoriation, Fluctuance, Friable, Induration, Rash, Dry/Scaly, Atrophie Blanche, Cyanosis, Ecchymosis, Hemosiderin  Staining, Mottled, Pallor, Rubor, Erythema. Periwound temperature was noted as No Abnormality. The periwound has tenderness on palpation. Assessment Active Problems ICD-10 L97.212 - Non-pressure chronic ulcer of right calf with fat layer exposed S81.801S - Unspecified open wound, right lower leg, sequela F60.3 - Borderline personality disorder Chronic right calf ulceration, status post I and D of abscess. Procedures Wound #1 Wound #1 is an Infection - not elsewhere classified located on the Right,Medial Lower Leg . There was a Skin/Subcutaneous Tissue Debridement ([id number]) debridement with total area of 12.6 sq cm performed by BURNS III, Melanie Crazier., MD. with the following instrument(s): Curette to remove Viable and Non-Viable tissue/material including Fat, Fibrin/Slough, and Subcutaneous after achieving pain control using Lidocaine 4% Topical Solution. A time out was conducted prior to the start of the procedure. A Minimum amount of bleeding was controlled with Pressure. The procedure was tolerated well with a pain level of 0 throughout and a pain level of 0 following the procedure. Post Debridement Measurements: 3.6cm length x 3.5cm width x 0.2cm depth; 1.979cm^3 volume. Post procedure Diagnosis Wound #1: Same as Pre-Procedure Plan KUULEI, KLEIER ([id number]) Wound Cleansing: Wound #1 Right,Medial Lower Leg: Clean wound with Normal Saline. May Shower, gently pat wound dry prior to applying new dressing. Anesthetic: Wound #1 Right,Medial Lower Leg: Topical  Lidocaine 4% cream applied to wound bed prior to debridement Skin Barriers/Peri-Wound Care: Wound #1 Right,Medial Lower Leg: Barrier cream - on peri-wound Primary Wound Dressing: Wound #1 Right,Medial Lower Leg: Aquacel Ag Secondary Dressing: Wound #1 Right,Medial Lower Leg: ABD and Kerlix/Conform Dry Gauze Dressing Change Frequency: Wound #1 Right,Medial Lower Leg: Change dressing every other day. - Three times  weekly Follow-up Appointments: Wound #1 Right,Medial Lower Leg: Return Appointment in 2 weeks. Edema Control: Wound #1 Right,Medial Lower Leg: Tubigrip - or ace wrap Additional Orders / Instructions: Wound #1 Right,Medial Lower Leg: Increase protein intake. Activity as tolerated Home Health: Wound #1 Right,Medial Lower Leg: Continue [facility] Visits - [facility] to visit patient three times week of 03/15/15 and two times week of 03/22/15 Home Health Nurse may visit PRN to address patient s wound care needs. FACE TO FACE ENCOUNTER: MEDICARE and MEDICAID PATIENTS: I certify that this patient is under my care and that I had a face-to-face encounter that meets the physician face-to-face encounter requirements with this patient on this date. The encounter with the patient was in whole or in part for the following MEDICAL CONDITION: (primary reason for Home Healthcare) MEDICAL NECESSITY: I certify, that based on my findings, NURSING services are a medically necessary home health service. HOME BOUND STATUS: I certify that my clinical findings support that this patient is homebound (i.e., Due to illness or injury, pt requires aid of supportive devices such as crutches, cane, wheelchairs, walkers, the use of special transportation or the assistance of another person to leave their place of residence. There is a normal inability to leave the home and doing so requires considerable and taxing effort. Other absences are for medical reasons / religious services and are infrequent or of short duration when for other reasons). If current dressing causes regression in wound condition, may D/C ordered dressing product/s and apply Normal Saline Moist Dressing daily until next Wound Healing Center / Other MD appointment. Notify Wound Healing Center of regression in wound condition at [phone]. Please direct any NON-WOUND related issues/requests for orders to patient's Primary Care  Physician [place], [name] ([id number]) I recommended compression bandages. She refuses. We'll continue with Ace wrap or Tubigrip for edema control. Silver alginate dressing changes. She refuses punch biopsy to rule out malignancy and better define etiology. Plastic surgery referral for consideration of debridement and skin graft. Electronic Signature(s) Signed: 03/10/2015 3:24:44 PM By: Madelaine Bhat MD Entered By: Madelaine Bhat on 03/10/2015 12:01:27 [name] ([id number]) -------------------------------------------------------------------------------- SuperBill Details Patient Name: [name] Date of Service: 03/10/2015 Medical Record Patient Account Number: [id number] Number: Treating RN: Curtis Sites July 14, 1965 (49 y.o. Other Clinician: Date of Birth/Sex: Female) Treating BURNS III, Primary Care Physician: Yves Dill Physician/Extender: Zollie Beckers Referring Physician: Yves Dill Weeks in Treatment: 7 Diagnosis Coding ICD-10 Codes Code Description [phone] Non-pressure chronic ulcer of right calf with fat layer exposed S81.801S Unspecified open wound, right lower leg, sequela F60.3 Borderline personality disorder Facility Procedures CPT4 Code: [id number] Description: 11042 - DEB SUBQ TISSUE 20 SQ CM/< ICD-10 Description Diagnosis L97.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 with fat Modifier: layer exposed Quantity: 1 Physician Procedures CPT4 Code: [id number] Description: 11042 - WC PHYS SUBQ TISS 20 SQ CM ICD-10 Description Diagnosis L97.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 with fat Modifier: layer exposed Quantity: 1 Electronic Signature(s) Signed: 03/10/2015 3:24:44 PM By: Madelaine Bhat MD Entered By: Madelaine Bhat on 03/10/2015 12:01:39

## 2015-03-15 ENCOUNTER — Emergency Department
Admission: EM | Admit: 2015-03-15 | Discharge: 2015-03-15 | Disposition: A | Discharge status: Home / Self Care | Payer: Medicare Other | Attending: Emergency Medicine | Admitting: Emergency Medicine

## 2015-03-15 ENCOUNTER — Encounter: Payer: Self-pay | Admitting: Medical Oncology

## 2015-03-15 DIAGNOSIS — F1510 Other stimulant abuse, uncomplicated: Secondary | ICD-10-CM | POA: Insufficient documentation

## 2015-03-15 DIAGNOSIS — F1310 Sedative, hypnotic or anxiolytic abuse, uncomplicated: Secondary | ICD-10-CM | POA: Insufficient documentation

## 2015-03-15 DIAGNOSIS — Z880 Allergy status to penicillin: Secondary | ICD-10-CM | POA: Diagnosis not present

## 2015-03-15 DIAGNOSIS — Z008 Encounter for other general examination: Secondary | ICD-10-CM | POA: Diagnosis present

## 2015-03-15 DIAGNOSIS — F329 Major depressive disorder, single episode, unspecified: Secondary | ICD-10-CM | POA: Diagnosis not present

## 2015-03-15 DIAGNOSIS — R45851 Suicidal ideations: Secondary | ICD-10-CM | POA: Diagnosis not present

## 2015-03-15 LAB — URINE DRUG SCREEN, QUALITATIVE (ARMC ONLY)
Amphetamines, Ur Screen: NOT DETECTED
Barbiturates, Ur Screen: NOT DETECTED
Benzodiazepine, Ur Scrn: POSITIVE — AB
CANNABINOID 50 NG, UR ~~LOC~~: NOT DETECTED
Cocaine Metabolite,Ur ~~LOC~~: NOT DETECTED
MDMA (ECSTASY) UR SCREEN: POSITIVE — AB
Methadone Scn, Ur: NOT DETECTED
OPIATE, UR SCREEN: NOT DETECTED
PHENCYCLIDINE (PCP) UR S: NOT DETECTED
Tricyclic, Ur Screen: NOT DETECTED

## 2015-03-15 LAB — COMPREHENSIVE METABOLIC PANEL
ALBUMIN: 4.1 g/dL (ref 3.5–5.0)
ALT: 23 U/L (ref 14–54)
ANION GAP: 5 (ref 5–15)
AST: 29 U/L (ref 15–41)
Alkaline Phosphatase: 53 U/L (ref 38–126)
BILIRUBIN TOTAL: 0.3 mg/dL (ref 0.3–1.2)
BUN: 29 mg/dL — AB (ref 6–20)
CO2: 28 mmol/L (ref 22–32)
Calcium: 9.5 mg/dL (ref 8.9–10.3)
Chloride: 108 mmol/L (ref 101–111)
Creatinine, Ser: 0.73 mg/dL (ref 0.44–1.00)
GFR calc Af Amer: >60 mL/min (ref >60)
GFR calc non Af Amer: >60 mL/min (ref >60)
GLUCOSE: 98 mg/dL (ref 65–99)
POTASSIUM: 3.6 mmol/L (ref 3.5–5.1)
SODIUM: 141 mmol/L (ref 135–145)
TOTAL PROTEIN: 7.8 g/dL (ref 6.5–8.1)

## 2015-03-15 LAB — CBC
HEMATOCRIT: 41.2 % (ref 35.0–47.0)
Hemoglobin: 13.6 g/dL (ref 12.0–16.0)
MCH: 30.8 pg (ref 26.0–34.0)
MCHC: 33 g/dL (ref 32.0–36.0)
MCV: 93.4 fL (ref 80.0–100.0)
PLATELETS: 297 10*3/uL (ref 150–440)
RBC: 4.41 MIL/uL (ref 3.80–5.20)
RDW: 13.3 % (ref 11.5–14.5)
WBC: 8.5 10*3/uL (ref 3.6–11.0)

## 2015-03-15 LAB — ACETAMINOPHEN LEVEL: Acetaminophen (Tylenol), Serum: <10 ug/mL — ABNORMAL LOW (ref 10–30)

## 2015-03-15 LAB — SALICYLATE LEVEL: Salicylate Lvl: <4 mg/dL (ref 2.8–30.0)

## 2015-03-15 LAB — ETHANOL

## 2015-03-15 NOTE — ED Notes (Signed)
Group home states cab is coming to get pt

## 2015-03-15 NOTE — Discharge Instructions (Signed)
Suicidal Feelings: How to Help Yourself  Suicide is the taking of one's own life. If you feel as though life is getting too tough to handle and are thinking about suicide, get help right away. To get help:   Call your local emergency services (911 in the [place]   Call a suicide hotline to speak with a trained counselor who understands how you are feeling. The following is a list of suicide hotlines in the [place]. For a list of hotlines in [place], visit www.suicide.org/hotlines/international/[place]-suicide-hotlines.html.    1-800-273-TALK (1-800-273-8255).    1-800-SUICIDE (1-800-784-2433).    1-888-628-9454. This is a hotline for Spanish speakers.    1-800-799-4TTY (1-800-799-4889). This is a hotline for TTY users.    1-866-4-U-TREVOR (1-866-488-7386). This is a hotline for lesbian, gay, bisexual, transgender, or questioning youth.   Contact a crisis center or a local suicide prevention center. To find a crisis center or suicide prevention center:    Call your local hospital, clinic, community service organization, mental health center, social service provider, or health department. Ask for assistance in connecting to a crisis center.    Visit www.suicidepreventionlifeline.org/getinvolved/locator for a list of crisis centers in the [place], or visit www.suicideprevention.[place]/thinking-about-suicide/find-a-crisis-centre for a list of centers in [place].   Visit the following websites:    National Suicide Prevention Lifeline: www.suicidepreventionlifeline.org    Hopeline: www.hopeline.com    American Foundation for Suicide Prevention: www.afsp.org    The Trevor Project (for lesbian, gay, bisexual, transgender, or questioning youth): www.thetrevorproject.org  HOW CAN I HELP MYSELF FEEL BETTER?   Promise yourself that you will not do anything drastic when you have suicidal feelings. Remember, there is hope. Many people have gotten through suicidal thoughts and feelings, and you will, too. You may  have gotten through them before, and this proves that you can get through them again.   Let family, friends, teachers, or counselors know how you are feeling. Try not to isolate yourself from those who care about you. Remember, they will want to help you. Talk with someone every day, even if you do not feel sociable. Face-to-face conversation is best.   Call a mental health professional and see one regularly.   Visit your primary health care provider every year.   Eat a well-balanced diet, and space your meals so you eat regularly.   Get plenty of rest.   Avoid alcohol and drugs, and remove them from your home. They will only make you feel worse.   If you are thinking of taking a lot of medicine, give your medicine to someone who can give it to you one day at a time. If you are on antidepressants and are concerned you will overdose, let your health care provider know so he or she can give you safer medicines. Ask your mental health professional about the possible side effects of any medicines you are taking.   Remove weapons, poisons, knives, and anything else that could harm you from your home.   Try to stick to routines. Follow a schedule every day. Put self-care on your schedule.   Make a list of realistic goals, and cross them off when you achieve them. Accomplishments give a sense of worth.   Wait until you are feeling better before doing the things you find difficult or unpleasant.   Exercise if you are able. You will feel better if you exercise for even a half hour each day.   Go out in the sun or into nature. This will help you   else that takes your mind off your depression if it is safe to do.  Keep your living space well lit.  When you are feeling well,  write yourself a letter about tips and support that you can read when you are not feeling well.  Remember that life's difficulties can be sorted out with help. Conditions can be treated. You can work on thoughts and strategies that serve you well.   This information is not intended to replace advice given to you by your health care provider. Make sure you discuss any questions you have with your health care provider.   Document Released: 10/08/2002 Document Revised: 04/24/2014 Document Reviewed: 07/29/2013 Elsevier Interactive Patient Education 2016 [employer]  No-harm Safety Contract A no-harm Engineer, manufacturing systemssafety contract is a written or verbal agreement between you and a mental health professional to promote safety. It contains specific actions and promises you agree to. The agreement also includes instructions from the therapist or doctor. The instructions will help prevent you from harming yourself or harming others. Harm can be as mild as pinching yourself, but can increase in intensity to actions like burning or cutting yourself. The extreme level of self-harm would be committing suicide. No-harm safety contracts are also sometimes referred to as a Charity fundraiserno-suicide contract, suicide Financial controllerprevention contract, no-harm agreements or decisions, or a Engineer, manufacturing systemssafety contract.  REASONS FOR NO-HARM SAFETY CONTRACTS Safety contracts are just one part of an overall treatment plan to help keep you safe and free of harm. A safety contract may help to relieve anxiety, restore a sense of control, state clearly the alternatives to harm or suicide, and give you and your therapist or doctor a gauge for how you are doing in between visits. Many factors impact the decision to use a no-harm safety contract and its effectiveness. A proper overall treatment plan and evaluation and good patient understanding are the keys to good outcomes. CONTRACT ELEMENTS  A contract can range from simple to complex. They include all or some of the following:    Action statements. These are statements you agree to do or not do. Example: If I feel my life is becoming too difficult, I agree to do the following so there is no harm to myself or others:  Talk with family or friends.  Rid myself of all things that I could use to harm myself.  Do an activity I enjoy or have enjoyed in the recent past. Coping strategies. These are ways to think and feel that decrease stress, such as:  Use of affirmations or positive statements about self.  Good self-care, including improved grooming, and healthy eating, and healthy sleeping patterns.  Increase physical exercise.  Increase social involvement.  Focus on positive aspects of life. Crisis management. This would include what to do if there was trouble following the contract or an urge to harm. This might include notifying family or your therapist of suicidal thoughts. Be open and honest about suicidal urges. To prevent a crisis, do the following:  List reasons to reach out for support.  Keep contact numbers and available hours handy. Treatment goals. These are goals would include no suicidal thoughts, improved mood, and feelings of hopefulness. Listed responsibilities of different people involved in care. This could include family members. A family member may agree to remove firearms or other lethal weapons/substances from your ease of access. A timeline. A timeline can be in place from one therapy session to the next session. HOME CARE INSTRUCTIONS   Follow your no-harm safety contract.  Contact your therapist and/or doctor if you have any questions or concerns. MAKE SURE YOU:   Understand these instructions.  Will watch your condition. Noticing any mood changes or suicidal urges.  Will get help right away if you are not doing well or get worse.   This information is not intended to replace advice given to you by your health care provider. Make sure you discuss any questions you have with your  health care provider.   Document Released: 09/21/2009 Document Revised: 04/24/2014 Document Reviewed: 09/21/2009 Elsevier Interactive Patient Education [employer]

## 2015-03-15 NOTE — ED Provider Notes (Signed)
[facility] Emergency Department Provider Note     Time seen: ----------------------------------------- 2:44 PM on 03/15/2015 -----------------------------------------    I have reviewed the triage vital signs and the nursing notes.   HISTORY  Chief Complaint Psychiatric Evaluation    HPI [name] is a 49 y.o. female who presents ER for depression and suicidal ideation. Patient states she is vague about walking from a car. Patient was seen by me several weeks ago for same, states she does not want to go back to the group home because of staff members need to repair.   Past Medical History  Diagnosis Date  . Asthma   . Anxiety   . GERD (gastroesophageal reflux disease)   . Mild cognitive impairment   . Cerebral palsy (HCC)   . Borderline personality disorder   . Depression   . Wound abscess     Patient Active Problem List   Diagnosis Date Noted  . Anxiety 12/31/2014  . Cellulitis and abscess of leg 12/31/2014  . 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 GERD (gastroesophageal reflux disease) 09/24/2014  . COPD (chronic obstructive pulmonary disease) (HCC) 09/24/2014  . Borderline personality disorder 09/24/2014  . Major depressive disorder, recurrent episode, moderate (HCC) 09/24/2014  . Cerebral palsy (HCC) 08/31/2014    Past Surgical History  Procedure Laterality Date  . Cholecystectomy    . Tonsillectomy    . Incision and drainage abscess Right 01/02/2015    Procedure: INCISION AND DRAINAGE ABSCESS;  Surgeon: Tiney Rougealph Ely III, MD;  Location: [facility] ORS;  Service: General;  Laterality: Right;    Allergies Penicillins  Social History Social History  Substance Use Topics  . Smoking status: Never Smoker   . Smokeless tobacco: Never Used  . Alcohol Use: No    Review of Systems Constitutional: Negative for fever. Eyes: Negative for visual changes. ENT: Negative for sore throat. Cardiovascular: Negative for chest  pain. Respiratory: Negative for shortness of breath. Gastrointestinal: Negative for abdominal pain, vomiting and diarrhea. Genitourinary: Negative for dysuria. Musculoskeletal: Negative for back pain. Skin: Negative for rash. Neurological: Negative for headaches, focal weakness or numbness. Psychiatric: Positive for suicidal ideation  10-point ROS otherwise negative.  ____________________________________________   PHYSICAL EXAM:  VITAL SIGNS: ED Triage Vitals  Enc Vitals Group     BP 03/15/15 1406 109/54 mmHg     Pulse Rate 03/15/15 1406 84     Resp 03/15/15 1406 18     Temp 03/15/15 1406 98.1 F (36.7 C)     Temp Source 03/15/15 1406 Oral     SpO2 03/15/15 1406 96 %     Weight 03/15/15 1406 220 lb (99.791 kg)     Height 03/15/15 1406 5\' 3"  (1.6 m)     Head Cir --      Peak Flow --      Pain Score 03/15/15 1407 5     Pain Loc --      Pain Edu? --      Excl. in GC? --     Constitutional: Alert and oriented. Well appearing and in no distress. Eyes: Conjunctivae are normal. PERRL. Normal extraocular movements. ENT   Head: Normocephalic and atraumatic.   Nose: No congestion/rhinnorhea.   Mouth/Throat: Mucous membranes are moist.   Neck: No stridor. Cardiovascular: Normal rate, regular rhythm. Normal and symmetric distal pulses are present in all extremities. No murmurs, rubs, or gallops. Respiratory: Normal respiratory effort without tachypnea nor retractions. Breath sounds are clear and equal bilaterally. No wheezes/rales/rhonchi. Gastrointestinal: Soft  and nontender. No distention. No abdominal bruits.  Musculoskeletal: Nontender with normal range of motion in all extremities. No joint effusions.  No lower extremity tenderness nor edema. Neurologic:  Normal speech and language. No gross focal neurologic deficits are appreciated. Speech is normal. No gait instability. Skin:  Skin is warm, dry and intact. No rash noted. Psychiatric: Patient describes to  suicidality, does not appear depressed or anxious ____________________________________________  ED COURSE:  Pertinent labs & imaging results that were available during my care of the patient were reviewed by me and considered in my medical decision making (see chart for details). Patient in no acute distress, will discuss with the psychiatry team. ____________________________________________    LABS (pertinent positives/negatives)  Labs Reviewed  COMPREHENSIVE METABOLIC PANEL  ETHANOL  SALICYLATE LEVEL  ACETAMINOPHEN LEVEL  CBC  URINE DRUG SCREEN, QUALITATIVE ([facility] ONLY)  ____________________________________________  FINAL ASSESSMENT AND PLAN  Suicidal ideation  Plan: Patient with labs as dictated above. Patient is not felt to be a threat to herself or anyone else.   Emily Filbert, MD   Emily Filbert, MD 03/15/15 [phone]

## 2015-03-15 NOTE — ED Notes (Signed)
Correction phone number is [phone]

## 2015-03-15 NOTE — ED Notes (Signed)
Pt brought in by mobile crisis for depression and suicidal ideation. Pt reports she thinks about walking out in front of a car. Pt expresses in triage that she does not want to go back to her group home bc there is a staff member that is mean to her.

## 2015-03-24 ENCOUNTER — Encounter: Payer: Medicare Other | Attending: Surgery | Admitting: Surgery

## 2015-03-24 DIAGNOSIS — X58XXXS Exposure to other specified factors, sequela: Secondary | ICD-10-CM | POA: Diagnosis not present

## 2015-03-24 DIAGNOSIS — S81801S Unspecified open wound, right lower leg, sequela: Secondary | ICD-10-CM | POA: Diagnosis not present

## 2015-03-24 DIAGNOSIS — F603 Borderline personality disorder: Secondary | ICD-10-CM | POA: Diagnosis not present

## 2015-03-24 DIAGNOSIS — L97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 with fat layer exposed: Secondary | ICD-10-CM | POA: Diagnosis not present

## 2015-03-25 LAB — SURGICAL PATHOLOGY

## 2015-03-25 NOTE — Progress Notes (Signed)
RASHON, REZEK ([id number]) Visit Report for 03/24/2015 Biopsy Details Patient Name: NAYLAH, CORK Date of Service: 03/24/2015 8:00 AM Medical Record Patient Account Number: [id number] Number: Treating RN: Phillis Haggis 09/04/1965 (49 y.o. Other Clinician: Date of Birth/Sex: Female) Treating BURNS III, Primary Care Physician: Yves Dill Physician/Extender: Zollie Beckers Referring Physician: Yves Dill Weeks in Treatment: 9 Biopsy Performed for: Wound #1 Right, Medial Lower Leg Location(s): Wound Margin Performed By: Physician BURNS III, Melanie Crazier., MD Tissue Punch: Yes Size (mm): 3 Number of Specimens Taken: 1 Specimen Sent To Pathology: Yes Time-Out Taken: Yes Pain Control: Lidocaine Injectable Lidocaine Percent: 2% Instrument: Forceps, Scissors Hemostasis Achieved: Pressure Procedural Pain: 8 Post Procedural Pain: 4 Response to Treatment: Procedure was tolerated well Post Procedure Diagnosis Same as Pre-procedure Notes Punch biopsy performed at lateral margin, 9:00. Electronic Signature(s) Signed: 03/24/2015 2:46:32 PM By: Madelaine Bhat MD Entered By: Madelaine Bhat on 03/24/2015 13:58:45 [name] ([id number]) -------------------------------------------------------------------------------- Chief Complaint Document Details Patient Name: [name] Date of Service: 03/24/2015 8:00 AM Medical Record Patient Account Number: [id number] Number: Treating RN: Phillis Haggis 1965-09-05 (49 y.o. Other Clinician: Date of Birth/Sex: Female) Treating BURNS III, Primary Care Physician: Yves Dill Physician/Extender: Zollie Beckers Referring Physician: Blain Pais in Treatment: 9 Information Obtained from: Patient Chief Complaint Right calf ulcer, status post I and D of abscess. Electronic Signature(s) Signed: 03/24/2015 2:46:32 PM By: Madelaine Bhat MD Entered By: Madelaine Bhat on 03/24/2015 13:58:55 [name]  ([id number]) -------------------------------------------------------------------------------- HPI Details Patient Name: [name] Date of Service: 03/24/2015 8:00 AM Medical Record Patient Account Number: [id number] Number: Treating RN: Phillis Haggis [birth year] (49 y.o. Other Clinician: Date of Birth/Sex: Female) Treating BURNS III, Primary Care Physician: Yves Dill Physician/Extender: Zollie Beckers Referring Physician: Blain Pais in Treatment: 9 History of Present Illness HPI Description: Pleasant 49 year old with history of borderline personality disorder and CP. No history of diabetes or peripheral arterial disease. Right ABI 1.2. She developed an ulceration on her right anterior calf secondary to an insect bite. This progressed to an abscess, and she underwent I and D on 01/02/2015 by Dr. Michela Pitcher. Cultures grew oxacillin sensitive staph aureus. She completed a course of clindamycin. She lives in a group home. Performing dressing changes with silver alginate. Using Ace wrap for edema control. Has declined compression bandage, Tubigrip, and punch biopsy. She returns to clinic for follow-up and is without new complaints. No significant pain. No fever or chills. Minimal drainage. Electronic Signature(s) Signed: 03/24/2015 2:46:32 PM By: Madelaine Bhat MD Entered By: Madelaine Bhat on 03/24/2015 13:59:09 [name] ([id number]) -------------------------------------------------------------------------------- Physical Exam Details Patient Name: [name] Date of Service: 03/24/2015 8:00 AM Medical Record Patient Account Number: [id number] Number: Treating RN: Phillis Haggis 02-27-1966 (49 y.o. Other Clinician: Date of Birth/Sex: Female) Treating BURNS III, Primary Care Physician: Yves Dill Physician/Extender: Zollie Beckers Referring Physician: Yves Dill Weeks in Treatment: 9 Constitutional . Pulse regular. Respirations normal and  unlabored. Afebrile. . Notes Right anterior calf ulceration unchanged. Full-thickness. Mild surrounding erythema. No residual abscess. Minimal edema. Palpable DP. Right ABI 1.2. Electronic Signature(s) Signed: 03/24/2015 2:46:32 PM By: Madelaine Bhat MD Entered By: Madelaine Bhat on 03/24/2015 13:59:30 [name] ([id number]) -------------------------------------------------------------------------------- Physician Orders Details Patient Name: [name] Date of Service: 03/24/2015 8:00 AM Medical Record Patient Account Number: [id number] Number: Treating RN: Phillis Haggis January 30, 1966 (49 y.o. Other Clinician: Date of Birth/Sex: Female) Treating BURNS III, Primary Care Physician: Yves Dill Physician/Extender: Zollie Beckers Referring Physician: Yves Dill Weeks in Treatment:  9 Verbal / Phone Orders: Yes Clinician: Pinkerton, Debi Read Back and Verified: Yes Diagnosis Coding Wound Cleansing Wound #1 Right,Medial Lower Leg o Clean wound with Normal Saline. o May Shower, gently pat wound dry prior to applying new dressing. Anesthetic Wound #1 Right,Medial Lower Leg o Topical Lidocaine 4% cream applied to wound bed prior to debridement Skin Barriers/Peri-Wound Care Wound #1 Right,Medial Lower Leg o Barrier cream - on peri-wound Primary Wound Dressing Wound #1 Right,Medial Lower Leg o Aquacel Ag Secondary Dressing Wound #1 Right,Medial Lower Leg o ABD and Kerlix/Conform o Dry Gauze Dressing Change Frequency Wound #1 Right,Medial Lower Leg o Change dressing every other day. - Three times weekly Follow-up Appointments Wound #1 Right,Medial Lower Leg o Return Appointment in 1 week. Edema Control Wound #1 Right,Medial Lower Leg o Tubigrip - or ace wrap Uppal, Harlea ([id number]) Additional Orders / Instructions Wound #1 Right,Medial Lower Leg o Increase protein intake. o Activity as tolerated Home Health Wound #1  Right,Medial Lower Leg o Continue Home Health Visits - [facility] to visit patient three times week of 03/15/15 and two times week of 03/22/15 o Home Health Nurse may visit PRN to address patientos wound care needs. o FACE TO FACE ENCOUNTER: MEDICARE and MEDICAID PATIENTS: I certify that this patient is under my care and that I had a face-to-face encounter that meets the physician face-to-face encounter requirements with this patient on this date. The encounter with the patient was in whole or in part for the following MEDICAL CONDITION: (primary reason for Home Healthcare) MEDICAL NECESSITY: I certify, that based on my findings, NURSING services are a medically necessary home health service. HOME BOUND STATUS: I certify that my clinical findings support that this patient is homebound (i.e., Due to illness or injury, pt requires aid of supportive devices such as crutches, cane, wheelchairs, walkers, the use of special transportation or the assistance of another person to leave their place of residence. There is a normal inability to leave the home and doing so requires considerable and taxing effort. Other absences are for medical reasons / religious services and are infrequent or of short duration when for other reasons). o If current dressing causes regression in wound condition, may D/C ordered dressing product/s and apply Normal Saline Moist Dressing daily until next Wound Healing Center / Other MD appointment. Notify Wound Healing Center of regression in wound condition at [phone]. o Please direct any NON-WOUND related issues/requests for orders to patient's Primary Care Physician Electronic Signature(s) Signed: 03/24/2015 2:46:32 PM By: Madelaine BhatBurns, III, Jonnie Truxillo MD Signed: 03/24/2015 4:30:52 PM By: Alejandro MullingPinkerton, Debra Entered By: Alejandro MullingPinkerton, Debra on 03/24/2015 08:44:35 Tempie DonningSHUMATE, Marrietta  ([id number]) -------------------------------------------------------------------------------- Problem List Details Patient Name: Tempie DonningSHUMATE, Briella Date of Service: 03/24/2015 8:00 AM Medical Record Patient Account Number: [id number] Number: Treating RN: Phillis Haggisinkerton, Debi Aug 26, 1965 (49 y.o. 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 S81.801S Unspecified open wound, right lower leg, sequela 01/20/2015 Yes F60.3 Borderline personality disorder 01/20/2015 Yes Inactive Problems Resolved Problems Electronic Signature(s) Signed: 03/24/2015 2:46:32 PM By: Madelaine BhatBurns, III, Parul Porcelli MD Entered By: Madelaine BhatBurns, III, Yazmen Briones on 03/24/2015 13:58:04 Tempie DonningSHUMATE, Zakeria ([id number]) -------------------------------------------------------------------------------- Progress Note Details Patient Name: Tempie DonningSHUMATE, Rogenia Date of Service: 03/24/2015 8:00 AM Medical Record Patient Account Number: [id number] Number: Treating RN: Ashok CordiaPinkerton, Debi Aug 26, 1965 (49  y.o. Other Clinician: Date of Birth/Sex: Female) Treating BURNS III, Primary Care Physician: Yves Dill Physician/Extender: Zollie Beckers Referring Physician: Blain Pais in Treatment: 9 Subjective Chief Complaint Information obtained from Patient Right calf ulcer, status post I and D of abscess. History of Present Illness (HPI) Pleasant 49 year old with history of borderline personality disorder and CP. No history of diabetes or peripheral arterial disease. Right ABI 1.2. She developed an ulceration on her right anterior calf secondary to an insect bite. This progressed to an abscess, and she underwent I and D on 01/02/2015 by Dr. Michela Pitcher.  Cultures grew oxacillin sensitive staph aureus. She completed a course of clindamycin. She lives in a group home. Performing dressing changes with silver alginate. Using Ace wrap for edema control. Has declined compression bandage, Tubigrip, and punch biopsy. She returns to clinic for follow-up and is without new complaints. No significant pain. No fever or chills. Minimal drainage. Objective Constitutional Pulse regular. Respirations normal and unlabored. Afebrile. Vitals Time Taken: 8:10 AM, Height: 64 in, Weight: 215 lbs, BMI: 36.9, Temperature: 98.4 F, Pulse: 71 bpm, Respiratory Rate: 20 breaths/min, Blood Pressure: 124/72 mmHg. General Notes: Right anterior calf ulceration unchanged. Full-thickness. Mild surrounding erythema. No residual abscess. Minimal edema. Palpable DP. Right ABI 1.2. Integumentary (Hair, Skin) Wound #1 status is Open. Original cause of wound was Bite. The wound is located on the [place], [place] ([id number]) Lower Leg. The wound measures 4.5cm length x 3.3cm width x 0.2cm depth; 11.663cm^2 area and 2.333cm^3 volume. The wound is limited to skin breakdown. There is no tunneling or undermining noted. There is a large amount of serosanguineous drainage noted. The wound margin is flat and intact. There is medium (34-66%) red granulation within the wound bed. There is a medium (34-66%) amount of necrotic tissue within the wound bed including Adherent Slough. The periwound skin appearance exhibited: Localized Edema, Scarring, Maceration, Moist. The periwound skin appearance did not exhibit: Callus, Crepitus, Excoriation, Fluctuance, Friable, Induration, Rash, Dry/Scaly, Atrophie Blanche, Cyanosis, Ecchymosis, Hemosiderin Staining, Mottled, Pallor, Rubor, Erythema. Periwound temperature was noted as No Abnormality. The periwound has tenderness on palpation. 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 Procedures Wound #1 Wound #1 is an Infection - not elsewhere classified located on the Right, Medial Lower Leg . There was a biopsy performed by BURNS III, Melanie Crazier., MD. There was a biopsy performed on Wound Margin. The skin was cleansed and prepped with anti-septic followed by pain control using Lidocaine Injectable: 2%. Utilizing a 3 mm tissue punch, tissue was removed at its base with the following instrument(s): Forceps and Scissors and sent to pathology. A time out was conducted prior to the start of the procedure. The procedure was tolerated well with a pain level of 8 throughout and a pain level of 4 following the procedure. Post procedure Diagnosis Wound #1: Same as Pre-Procedure General Notes: Punch biopsy performed at lateral margin, 9:00.. Plan Wound Cleansing: ANALYN, MATUSEK ([id number]) Wound #1 Right,Medial Lower Leg: Clean wound with Normal Saline. May Shower, gently pat wound dry prior to applying new dressing. Anesthetic: Wound #1 Right,Medial Lower Leg: Topical Lidocaine 4% cream applied to wound bed prior to debridement Skin Barriers/Peri-Wound Care: Wound #1 Right,Medial Lower Leg: Barrier cream - on peri-wound Primary Wound Dressing: Wound #1 Right,Medial Lower Leg: Aquacel Ag Secondary Dressing: Wound #1 Right,Medial Lower Leg: ABD and Kerlix/Conform  Dry Gauze Dressing Change Frequency: Wound #1 Right,Medial Lower Leg: Change dressing every other day. - Three times weekly Follow-up Appointments: Wound #1 Right,Medial Lower Leg: Return Appointment in 1 week. Edema Control: Wound #1 Right,Medial Lower Leg: Tubigrip - or ace wrap Additional Orders / Instructions: Wound #1 Right,Medial Lower Leg: Increase protein intake. Activity as tolerated Home Health: Wound #1 Right,Medial Lower Leg: Continue Home  [facility] Visits - [facility] to visit patient three times week of 03/15/15 and two times week of 03/22/15 Home Health Nurse may visit PRN to address patient s wound care needs. FACE TO FACE ENCOUNTER: MEDICARE and MEDICAID PATIENTS: I certify that this patient is under my care and that I had a face-to-face encounter that meets the physician face-to-face encounter requirements with this patient on this date. The encounter with the patient was in whole or in part for the following MEDICAL CONDITION: (primary reason for Home Healthcare) MEDICAL NECESSITY: I certify, that based on my findings, NURSING services are a medically necessary home health service. HOME BOUND STATUS: I certify that my clinical findings support that this patient is homebound (i.e., Due to illness or injury, pt requires aid of supportive devices such as crutches, cane, wheelchairs, walkers, the use of special transportation or the assistance of another person to leave their place of residence. There is a normal inability to leave the home and doing so requires considerable and taxing effort. Other absences are for medical reasons / religious services and are infrequent or of short duration when for other reasons). If current dressing causes regression in wound condition, may D/C ordered dressing product/s and apply Normal Saline Moist Dressing daily until next Wound Healing Center / Other MD appointment. Notify Wound Healing Center of regression in wound condition at [phone]. Please direct any NON-WOUND related issues/requests for orders to patient's Primary Care Physician INFANTOF, VILLAGOMEZ ([id number]) Punch biopsy performed today. Continue with silver alginate and Tubigrip. Plastic surgery consultation scheduled for January. Electronic Signature(s) Signed: 03/24/2015 2:46:32 PM By: Madelaine Bhat MD Entered By: Madelaine Bhat on 03/24/2015 14:00:31 [name]  ([id number]) -------------------------------------------------------------------------------- SuperBill Details Patient Name: [name] Date of Service: 03/24/2015 Medical Record Patient Account Number: [id number] Number: Treating RN: Phillis Haggis 12-01-1965 (49 y.o. Other Clinician: Date of Birth/Sex: Female) Treating BURNS III, Primary Care Physician: Yves Dill Physician/Extender: Zollie Beckers Referring Physician: Yves Dill Weeks in Treatment: 9 Diagnosis Coding ICD-10 Codes Code Description L97.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 with fat layer exposed S81.801S Unspecified open wound, right lower leg, sequela F60.3 Borderline personality disorder Facility Procedures CPT4 Code: [id number] Description: 11100 - BIOPSY SKIN-ONE ICD-10 Description Diagnosis L97.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 with fat Modifier: layer exposed Quantity: 1 Physician Procedures CPT4 Code: [id number] Description: 11100 - WC PHYS BIOPSY-SKIN ONE ICD-10 Description Diagnosis L97.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 with fat l Modifier: ayer exposed Quantity: 1 Electronic Signature(s) Signed: 03/24/2015 2:46:32 PM By: Madelaine Bhat MD Entered By: Madelaine Bhat on 03/24/2015 14:00:45

## 2015-03-25 NOTE — Progress Notes (Signed)
[name], Green ([id number]) Visit Report for 03/24/2015 Arrival Information Details Patient Name: [name], [name] Date of Service: 03/24/2015 8:00 AM Medical Record Number: [id number] Patient Account Number: [id number] Date of Birth/Sex: 1965/12/17 (49 y.o. Female) Treating RN: Phillis Haggis Primary Care Physician: Yves Dill Other Clinician: Referring Physician: Yves Dill Treating Physician/Extender: BURNS III, Regis Bill in Treatment: 9 Visit Information History Since Last Visit All ordered tests and consults were completed: No Patient Arrived: Ambulatory Added or deleted any medications: No Arrival Time: 08:09 Any new allergies or adverse reactions: No Accompanied By: caregiver Had a fall or experienced change in No Transfer Assistance: None activities of daily living that may affect Patient Identification Verified: Yes risk of falls: Secondary Verification Process Yes Signs or symptoms of abuse/neglect since last No Completed: visito Patient Requires Transmission-Based No Hospitalized since last visit: No Precautions: Pain Present Now: Yes Patient Has Alerts: Yes Patient Alerts: aspirin Electronic Signature(s) Signed: 03/24/2015 4:30:52 PM By: Alejandro Mulling Entered By: Alejandro Mulling on 03/24/2015 08:09:48 [name] ([id number]) -------------------------------------------------------------------------------- Encounter Discharge Information Details Patient Name: [name] Date of Service: 03/24/2015 8:00 AM Medical Record Number: [id number] Patient Account Number: [id number] Date of Birth/Sex: 27-Oct-1965 (49 y.o. Female) Treating RN: Phillis Haggis Primary Care Physician: Yves Dill Other Clinician: Referring Physician: Yves Dill Treating Physician/Extender: BURNS III, Regis Bill in Treatment: 9 Encounter Discharge Information Items Discharge Pain Level: 4 Discharge Condition: Stable Ambulatory Status: Ambulatory Other  (Note Discharge Destination: Required) Transportation: Private Auto Accompanied By: caregiver Schedule Follow-up Appointment: No Medication Reconciliation completed and provided to Patient/Care No Woodard Perrell: Provided on Clinical Summary of Care: 03/24/2015 Form Type Recipient Paper Patient [place] Electronic Signature(s) Signed: 03/24/2015 9:00:23 AM By: Gwenlyn Perking Entered By: Gwenlyn Perking on 03/24/2015 09:00:23 [name] ([id number]) -------------------------------------------------------------------------------- Lower Extremity Assessment Details Patient Name: [name] Date of Service: 03/24/2015 8:00 AM Medical Record Number: [id number] Patient Account Number: [id number] Date of Birth/Sex: [birth year] (49 y.o. Female) Treating RN: Phillis Haggis Primary Care Physician: Yves Dill Other Clinician: Referring Physician: Yves Dill Treating Physician/Extender: BURNS III, Regis Bill in Treatment: 9 Edema Assessment Assessed: [Left: No] [Right: No] Edema: [Left: N] [Right: o] Vascular Assessment Pulses: Posterior Tibial Palpable: [Right:Yes] Dorsalis Pedis Palpable: [Right:Yes] Extremity colors, hair growth, and conditions: Extremity Color: [Right:Normal] Hair Growth on Extremity: [Right:Yes] Temperature of Extremity: [Right:Warm] Capillary Refill: [Right:< 3 seconds] Toe Nail Assessment Left: Right: Thick: No Discolored: No Deformed: No Improper Length and Hygiene: No Electronic Signature(s) Signed: 03/24/2015 4:30:52 PM By: Alejandro Mulling Entered By: Alejandro Mulling on 03/24/2015 08:26:45 [name] ([id number]) -------------------------------------------------------------------------------- Multi Wound Chart Details Patient Name: [name] Date of Service: 03/24/2015 8:00 AM Medical Record Number: [id number] Patient Account Number: [id number] Date of Birth/Sex: 09/05/1965 (49 y.o. Female) Treating RN: Phillis Haggis Primary  Care Physician: Yves Dill Other Clinician: Referring Physician: Yves Dill Treating Physician/Extender: BURNS III, Regis Bill in Treatment: 9 Vital Signs Height(in): 64 Pulse(bpm): 71 Weight(lbs): 215 Blood Pressure 124/72 (mmHg): Body Mass Index(BMI): 37 Temperature(F): 98.4 Respiratory Rate 20 (breaths/min): Photos: [1:No Photos] [N/A:N/A] Wound Location: [1:Right Lower Leg - Medial] [N/A:N/A] Wounding Event: [1:Bite] [N/A:N/A] Primary Etiology: [1:Infection - not elsewhere classified] [N/A:N/A] Date Acquired: [1:12/28/2014] [N/A:N/A] Weeks of Treatment: [1:9] [N/A:N/A] Wound Status: [1:Open] [N/A:N/A] Measurements L x W x D 4.5x3.3x0.2 [N/A:N/A] (cm) Area (cm) : [1:11.663] [N/A:N/A] Volume (cm) : [1:2.333] [N/A:N/A] % Reduction in Area: [1:-98.00%] [N/A:N/A] % Reduction in Volume: -296.10% [N/A:N/A] Classification: [1:Full Thickness Without Exposed Support Structures] [N/A:N/A] Exudate Amount: [1:Large] [N/A:N/A] Exudate Type: [1:Serosanguineous] [N/A:N/A] Exudate Color: [1:red, brown] [N/A:N/A]  Wound Margin: [1:Flat and Intact] [N/A:N/A] Granulation Amount: [1:Medium (34-66%)] [N/A:N/A] Granulation Quality: [1:Red] [N/A:N/A] Necrotic Amount: [1:Medium (34-66%)] [N/A:N/A] Exposed Structures: [1:Fascia: No Fat: No Tendon: No Muscle: No Joint: No Bone: No] [N/A:N/A] Limited to Skin Breakdown Epithelialization: None N/A N/A Periwound Skin Texture: Edema: Yes N/A N/A Scarring: Yes Excoriation: No Induration: No Callus: No Crepitus: No Fluctuance: No Friable: No Rash: No Periwound Skin Maceration: Yes N/A N/A Moisture: Moist: Yes Dry/Scaly: No Periwound Skin Color: Atrophie Blanche: No N/A N/A Cyanosis: No Ecchymosis: No Erythema: No Hemosiderin Staining: No Mottled: No Pallor: No Rubor: No Temperature: No Abnormality N/A N/A Tenderness on Yes N/A N/A Palpation: Wound Preparation: Ulcer Cleansing: N/A N/A Rinsed/Irrigated  with Saline Topical Anesthetic Applied: Other: lidocaine 4% Treatment Notes Electronic Signature(s) Signed: 03/24/2015 4:30:52 PM By: Alejandro Mulling Entered By: Alejandro Mulling on 03/24/2015 08:27:06 [name] ([id number]) -------------------------------------------------------------------------------- Multi-Disciplinary Care Plan Details Patient Name: [name] Date of Service: 03/24/2015 8:00 AM Medical Record Number: [id number] Patient Account Number: [id number] Date of Birth/Sex: 1965-07-04 (49 y.o. Female) Treating RN: Phillis Haggis Primary Care Physician: Yves Dill Other Clinician: Referring Physician: Yves Dill Treating Physician/Extender: BURNS III, Regis Bill in Treatment: 9 Active Inactive Orientation to the Wound Care Program Nursing Diagnoses: Knowledge deficit related to the wound healing center program Goals: Patient/caregiver will verbalize understanding of the Wound Healing Center Program Date Initiated: 01/20/2015 Goal Status: Active Interventions: Provide education on orientation to the wound center Notes: Soft Tissue Infection Nursing Diagnoses: Potential for infection: soft tissue Goals: Patient will remain free of wound infection Date Initiated: 01/20/2015 Goal Status: Active Interventions: Assess signs and symptoms of infection every visit Treatment Activities: Culture and sensitivity : 03/24/2015 Notes: Wound/Skin Impairment Nursing Diagnoses: Impaired tissue integrity [name], [name] ([id number]) Goals: Ulcer/skin breakdown will heal within 14 weeks Date Initiated: 01/20/2015 Goal Status: Active Interventions: Assess patient/caregiver ability to obtain necessary supplies Notes: Electronic Signature(s) Signed: 03/24/2015 4:30:52 PM By: Alejandro Mulling Entered By: Alejandro Mulling on 03/24/2015 08:24:51 [name]  ([id number]) -------------------------------------------------------------------------------- Pain Assessment Details Patient Name: [name] Date of Service: 03/24/2015 8:00 AM Medical Record Number: [id number] Patient Account Number: [id number] Date of Birth/Sex: 01-22-1966 (49 y.o. Female) Treating RN: Phillis Haggis Primary Care Physician: Yves Dill Other Clinician: Referring Physician: Yves Dill Treating Physician/Extender: BURNS III, Regis Bill in Treatment: 9 Active Problems Location of Pain Severity and Description of Pain Patient Has Paino Yes Site Locations Pain Location: Pain in Ulcers With Dressing Change: Yes Duration of the Pain. Constant / Intermittento Constant Rate the pain. Current Pain Level: 8 Worst Pain Level: 10 Least Pain Level: 5 Character of Pain Describe the Pain: Sharp, Throbbing Pain Management and Medication Current Pain Management: Electronic Signature(s) Signed: 03/24/2015 4:30:52 PM By: Alejandro Mulling Entered By: Alejandro Mulling on 03/24/2015 08:10:28 [name] ([id number]) -------------------------------------------------------------------------------- Patient/Caregiver Education Details Patient Name: [name] Date of Service: 03/24/2015 8:00 AM Medical Record Number: [id number] Patient Account Number: [id number] Date of Birth/Gender: 1965/07/28 (49 y.o. Female) Treating RN: Phillis Haggis Primary Care Physician: Yves Dill Other Clinician: Referring Physician: Yves Dill Treating Physician/Extender: BURNS III, Regis Bill in Treatment: 9 Education Assessment Education Provided To: Patient and Caregiver Education Topics Provided Wound/Skin Impairment: Handouts: Other: change dressing as ordered Methods: Demonstration, Explain/Verbal Responses: State content correctly Electronic Signature(s) Signed: 03/24/2015 4:30:52 PM By: Alejandro Mulling Entered By: Alejandro Mulling on 03/24/2015  08:47:59 [name] ([id number]) -------------------------------------------------------------------------------- Wound Assessment Details Patient Name: [name] Date of Service: 03/24/2015 8:00 AM Medical Record Number: [id number] Patient Account Number: [id number] Date of  Birth/Sex: 1965/09/08 (49 y.o. Female) Treating RN: Ashok CordiaPinkerton, Debi Primary Care Physician: Yves DillKhan, Neelam Other Clinician: Referring Physician: Yves DillKhan, Neelam Treating Physician/Extender: BURNS III, Regis BillWALTER Weeks in Treatment: 9 Wound Status Wound Number: 1 Primary Etiology: Infection - not elsewhere classified Wound Location: Right Lower Leg - Medial Wound Status: Open Wounding Event: Bite Date Acquired: 12/28/2014 Weeks Of Treatment: 9 Clustered Wound: No Photos Photo Uploaded By: Alejandro MullingPinkerton, Debra on 03/24/2015 15:46:53 Wound Measurements Length: (cm) 4.5 Width: (cm) 3.3 Depth: (cm) 0.2 Area: (cm) 11.663 Volume: (cm) 2.333 % Reduction in Area: -98% % Reduction in Volume: -296.1% Epithelialization: None Tunneling: No Undermining: No Wound Description Full Thickness Without Exposed Classification: Support Structures Wound Margin: Flat and Intact Exudate Large Amount: Exudate Type: Serosanguineous Exudate Color: red, brown Foul Odor After Cleansing: No Wound Bed Granulation Amount: Medium (34-66%) Exposed Structure Granulation Quality: Red Fascia Exposed: No Necrotic Amount: Medium (34-66%) Fat Layer Exposed: No [name], [name] ([id number]) Necrotic Quality: Adherent Slough Tendon Exposed: No Muscle Exposed: No Joint Exposed: No Bone Exposed: No Limited to Skin Breakdown Periwound Skin Texture Texture Color No Abnormalities Noted: No No Abnormalities Noted: No Callus: No Atrophie Blanche: No Crepitus: No Cyanosis: No Excoriation: No Ecchymosis: No Fluctuance: No Erythema: No Friable: No Hemosiderin Staining: No Induration: No Mottled: No Localized Edema:  Yes Pallor: No Rash: No Rubor: No Scarring: Yes Temperature / Pain Moisture Temperature: No Abnormality No Abnormalities Noted: No Tenderness on Palpation: Yes Dry / Scaly: No Maceration: Yes Moist: Yes Wound Preparation Ulcer Cleansing: Rinsed/Irrigated with Saline Topical Anesthetic Applied: Other: lidocaine 4%, Treatment Notes Wound #1 (Right, Medial Lower Leg) 1. Cleansed with: Clean wound with Normal Saline 2. Anesthetic Topical Lidocaine 4% cream to wound bed prior to debridement 4. Dressing Applied: Aquacel Ag 5. Secondary Dressing Applied ABD Pad Kerlix/Conform 6. Footwear/Offloading device applied Tubigrip Notes or acewrap Electronic Signature(s) Signed: 03/24/2015 4:30:52 PM By: Mar DaringPinkerton, Debra [name], [name] ([id number]) Entered By: Alejandro MullingPinkerton, Debra on 03/24/2015 08:24:42 Sherri DonningSHUMATE, [name] ([id number]) -------------------------------------------------------------------------------- Vitals Details Patient Name: Sherri DonningSHUMATE, [name] Date of Service: 03/24/2015 8:00 AM Medical Record Number: [id number] Patient Account Number: [id number] Date of Birth/Sex: 1965/09/08 (49 y.o. Female) Treating RN: Ashok CordiaPinkerton, Debi Primary Care Physician: Yves DillKhan, Neelam Other Clinician: Referring Physician: Yves DillKhan, Neelam Treating Physician/Extender: BURNS III, Regis BillWALTER Weeks in Treatment: 9 Vital Signs Time Taken: 08:10 Temperature (F): 98.4 Height (in): 64 Pulse (bpm): 71 Weight (lbs): 215 Respiratory Rate (breaths/min): 20 Body Mass Index (BMI): 36.9 Blood Pressure (mmHg): 124/72 Reference Range: 80 - 120 mg / dl Electronic Signature(s) Signed: 03/24/2015 4:30:52 PM By: Alejandro MullingPinkerton, Debra Entered By: Alejandro MullingPinkerton, Debra on 03/24/2015 08:14:09

## 2015-03-26 LAB — CULTURE, BLOOD (ROUTINE X 2): Culture: NO GROWTH

## 2015-03-28 ENCOUNTER — Emergency Department: Payer: Medicare Other

## 2015-03-28 ENCOUNTER — Emergency Department
Admission: EM | Admit: 2015-03-28 | Discharge: 2015-03-28 | Disposition: A | Discharge status: Home / Self Care | Payer: Medicare Other | Attending: Emergency Medicine | Admitting: Emergency Medicine

## 2015-03-28 ENCOUNTER — Encounter: Payer: Self-pay | Admitting: Emergency Medicine

## 2015-03-28 DIAGNOSIS — R05 Cough: Secondary | ICD-10-CM | POA: Diagnosis present

## 2015-03-28 DIAGNOSIS — Z791 Long term (current) use of non-steroidal anti-inflammatories (NSAID): Secondary | ICD-10-CM | POA: Insufficient documentation

## 2015-03-28 DIAGNOSIS — J449 Chronic obstructive pulmonary disease, unspecified: Secondary | ICD-10-CM | POA: Diagnosis not present

## 2015-03-28 DIAGNOSIS — Z880 Allergy status to penicillin: Secondary | ICD-10-CM | POA: Diagnosis not present

## 2015-03-28 DIAGNOSIS — Z79899 Other long term (current) drug therapy: Secondary | ICD-10-CM | POA: Insufficient documentation

## 2015-03-28 DIAGNOSIS — Z7951 Long term (current) use of inhaled steroids: Secondary | ICD-10-CM | POA: Insufficient documentation

## 2015-03-28 DIAGNOSIS — J209 Acute bronchitis, unspecified: Secondary | ICD-10-CM

## 2015-03-28 LAB — CBC WITH DIFFERENTIAL/PLATELET
BASOS ABS: 0.1 10*3/uL (ref 0–0.1)
BASOS PCT: 1 %
Eosinophils Absolute: 0.1 10*3/uL (ref 0–0.7)
Eosinophils Relative: 1 %
HEMATOCRIT: 39 % (ref 35.0–47.0)
HEMOGLOBIN: 13.3 g/dL (ref 12.0–16.0)
LYMPHS PCT: 20 %
Lymphs Abs: 1.2 10*3/uL (ref 1.0–3.6)
MCH: 31 pg (ref 26.0–34.0)
MCHC: 34 g/dL (ref 32.0–36.0)
MCV: 91.3 fL (ref 80.0–100.0)
Monocytes Absolute: 0.8 10*3/uL (ref 0.2–0.9)
Monocytes Relative: 14 %
NEUTROS ABS: 3.8 10*3/uL (ref 1.4–6.5)
NEUTROS PCT: 64 %
PLATELETS: 268 10*3/uL (ref 150–440)
RBC: 4.27 MIL/uL (ref 3.80–5.20)
RDW: 13.1 % (ref 11.5–14.5)
WBC: 5.9 10*3/uL (ref 3.6–11.0)

## 2015-03-28 MED ORDER — IBUPROFEN 800 MG PO TABS
800.0000 mg | ORAL_TABLET | Freq: Once | ORAL | Status: AC
  Administered 2015-03-28: 800 mg via ORAL
  Filled 2015-03-28: qty 1

## 2015-03-28 MED ORDER — IBUPROFEN 800 MG PO TABS: 800.0000 mg | ORAL_TABLET | Freq: Three times a day (TID) | ORAL | Status: DC | PRN

## 2015-03-28 MED ORDER — AZITHROMYCIN 250 MG PO TABS
500.0000 mg | ORAL_TABLET | Freq: Once | ORAL | Status: AC
  Administered 2015-03-28: 500 mg via ORAL
  Filled 2015-03-28: qty 2

## 2015-03-28 MED ORDER — GUAIFENESIN-CODEINE 100-10 MG/5ML PO SOLN
5.0000 mL | Freq: Once | ORAL | Status: AC
  Administered 2015-03-28: 5 mL via ORAL
  Filled 2015-03-28: qty 5

## 2015-03-28 MED ORDER — AZITHROMYCIN 250 MG PO TABS: ORAL_TABLET | ORAL | Status: DC

## 2015-03-28 MED ORDER — GUAIFENESIN-CODEINE 100-10 MG/5ML PO SOLN: 10.0000 mL | ORAL | Status: DC | PRN

## 2015-03-28 NOTE — Discharge Instructions (Signed)

## 2015-03-28 NOTE — ED Provider Notes (Signed)
[facility] Emergency Department Provider Note  ____________________________________________  Time seen: Approximately 2:35 PM  I have reviewed the triage vital signs and the nursing notes.   HISTORY  Chief Complaint Cough and Nasal Congestion    HPI [name] is a 49 y.o. female resents for evaluation of cough congestion and chills for one week. She currently resides in a group home with other sick individuals.   Past Medical History  Diagnosis Date  . Asthma   . Anxiety   . GERD (gastroesophageal reflux disease)   . Mild cognitive impairment   . Cerebral palsy (HCC)   . Borderline personality disorder   . Depression   . Wound abscess     Patient Active Problem List   Diagnosis Date Noted  . Anxiety 12/31/2014  . Cellulitis and abscess of leg 12/31/2014  . 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 GERD (gastroesophageal reflux disease) 09/24/2014  . COPD (chronic obstructive pulmonary disease) (HCC) 09/24/2014  . Borderline personality disorder 09/24/2014  . Major depressive disorder, recurrent episode, moderate (HCC) 09/24/2014  . Cerebral palsy (HCC) 08/31/2014    Past Surgical History  Procedure Laterality Date  . Cholecystectomy    . Tonsillectomy    . Incision and drainage abscess Right 01/02/2015    Procedure: INCISION AND DRAINAGE ABSCESS;  Surgeon: Tiney Rouge III, MD;  Location: [facility] ORS;  Service: General;  Laterality: Right;    Current Outpatient Rx  Name  Route  Sig  Dispense  Refill  . acetaminophen (TYLENOL) 500 MG tablet   Oral   Take 500 mg by mouth every 4 (four) hours as needed for mild pain, moderate pain, fever or headache.         . ARIPiprazole (ABILIFY) 10 MG tablet   Oral   Take 1 tablet (10 mg total) by mouth daily.   30 tablet   0   . azithromycin (ZITHROMAX Z-PAK) 250 MG tablet      Take 2 tablets (500 mg) on  Day 1,  followed by 1 tablet (250 mg) once daily on Days 2 through 5.   6 each    0   . diphenoxylate-atropine (LOMOTIL) 2.5-0.025 MG per tablet   Oral   Take 1 tablet by mouth every 6 (six) hours as needed for diarrhea or loose stools.         Marland Kitchen escitalopram (LEXAPRO) 20 MG tablet   Oral   Take 1 tablet (20 mg total) by mouth at bedtime.   30 tablet   0   . fenofibrate 54 MG tablet   Oral   Take 54 mg by mouth daily.         . Fluticasone-Salmeterol (ADVAIR) 100-50 MCG/DOSE AEPB   Inhalation   Inhale 1 puff into the lungs every 12 (twelve) hours.          Marland Kitchen guaiFENesin-codeine 100-10 MG/5ML syrup   Oral   Take 10 mLs by mouth every 4 (four) hours as needed for cough.   180 mL   0   . ibuprofen (ADVIL,MOTRIN) 800 MG tablet   Oral   Take 1 tablet (800 mg total) by mouth every 8 (eight) hours as needed.   30 tablet   0   . ketoconazole (NIZORAL) 2 % cream   Topical   Apply 1 application topically 2 (two) times daily as needed for irritation. To breast and urinal areas         . meloxicam (MOBIC) 15 MG tablet  Oral   Take 15 mg by mouth daily.         . miconazole (ZEASORB-AF) 2 % powder   Topical   Apply 1 application topically 2 (two) times daily.         . pantoprazole (PROTONIX) 40 MG tablet   Oral   Take 40 mg by mouth daily.         . traMADol (ULTRAM) 50 MG tablet   Oral   Take 50 mg by mouth 2 (two) times daily as needed for moderate pain or severe pain.           Allergies Penicillins  Family History  Problem Relation Age of Onset  . Diabetes Mother   . Heart attack Father     Social History Social History  Substance Use Topics  . Smoking status: Never Smoker   . Smokeless tobacco: Never Used  . Alcohol Use: No    Review of Systems Constitutional: No fever/chills Eyes: No visual changes. ENT: No sore throat. Cardiovascular: Denies chest pain. Respiratory: Denies shortness of breath. Positive for cough Gastrointestinal: No abdominal pain.  No nausea, no vomiting.  No diarrhea.  No  constipation. Genitourinary: Negative for dysuria. Musculoskeletal: Negative for back pain. Skin: Negative for rash. Neurological: Negative for headaches, focal weakness or numbness.  10-point ROS otherwise negative.  ____________________________________________   PHYSICAL EXAM:  VITAL SIGNS: ED Triage Vitals  Enc Vitals Group     BP 03/28/15 1417 116/73 mmHg     Pulse Rate 03/28/15 1417 89     Resp 03/28/15 1417 18     Temp 03/28/15 1417 98.1 F (36.7 C)     Temp Source 03/28/15 1417 Oral     SpO2 03/28/15 1417 97 %     Weight 03/28/15 1417 207 lb (93.895 kg)     Height 03/28/15 1417  (1.6 m)     Head Cir --      Peak Flow --      Pain Score 03/28/15 1417 10     Pain Loc --      Pain Edu? --      Excl. in GC? --     Constitutional: Alert and oriented. Well appearing and in no acute distress. Eyes: Conjunctivae are normal. PERRL. EOMI. Head: Atraumatic. Nose: No congestion/rhinnorhea. Mouth/Throat: Mucous membranes are moist.  Oropharynx non-erythematous. Neck: No stridor.   Cardiovascular: Normal rate, regular rhythm. Grossly normal heart sounds.  Good peripheral circulation. Respiratory: Normal respiratory effort.  No retractions. Lungs CTAB. Gastrointestinal: Soft and nontender. No distention. No abdominal bruits. No CVA tenderness. Musculoskeletal: No lower extremity tenderness nor edema.  No joint effusions. Neurologic:  Normal speech and language. No gross focal neurologic deficits are appreciated. No gait instability. Skin:  Skin is warm, dry and intact. No rash noted. Psychiatric: Mood and affect are normal. Speech and behavior are normal.  ____________________________________________   LABS (all labs ordered are listed, but only abnormal results are displayed)  Labs Reviewed  CBC WITH DIFFERENTIAL/PLATELET    RADIOLOGY  Negative for any pulmonary findings. ____________________________________________   PROCEDURES  Procedure(s) performed:  None  Critical Care performed: No  ____________________________________________   INITIAL IMPRESSION / ASSESSMENT AND PLAN / ED COURSE  Pertinent labs & imaging results that were available during my care of the patient were reviewed by me and considered in my medical decision making (see chart for details).  Acute upper respiratory infection. Rx given for Zithromax, Robitussin-AC. First dose given here patient follow-up with  PCP or return to the ER with any worsening symptomology. Patient voices no other emergency medical complaints at this time. ____________________________________________   FINAL CLINICAL IMPRESSION(S) / ED DIAGNOSES  Final diagnoses:  Acute bronchitis, unspecified organism      Evangeline Dakinharles M Beers, PA-C 03/28/15 1557  Phineas SemenGraydon Goodman, MD 03/28/15 [phone]

## 2015-03-28 NOTE — ED Notes (Signed)
Discussed discharge instructions, prescriptions, and follow-up care with patient. No questions or concerns at this time. Pt stable at discharge.  

## 2015-03-28 NOTE — ED Notes (Signed)
C/o cough, congestion and chills x 1 week

## 2015-03-31 ENCOUNTER — Encounter: Payer: Medicare Other | Admitting: Surgery

## 2015-03-31 DIAGNOSIS — L97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 with fat layer exposed: Secondary | ICD-10-CM | POA: Diagnosis not present

## 2015-04-01 NOTE — Progress Notes (Signed)
[name], [name] ([id number]) Visit Report for 03/31/2015 Arrival Information Details Patient Name: [name], [name] Date of Service: 03/31/2015 1:45 PM Medical Record Number: [id number] Patient Account Number: [id number] Date of Birth/Sex: 1965/06/29 (49 y.o. Female) Treating RN: Curtis Sitesorthy, Joanna Primary Care Physician: Yves DillKhan, Neelam Other Clinician: Referring Physician: Yves DillKhan, Neelam Treating Physician/Extender: BURNS III, Regis BillWALTER Weeks in Treatment: 10 Visit Information History Since Last Visit Added or deleted any medications: No Patient Arrived: Ambulatory Any new allergies or adverse reactions: No Arrival Time: 13:50 Had a fall or experienced change in No Accompanied By: staff activities of daily living that may affect Transfer Assistance: None risk of falls: Patient Identification Verified: Yes Signs or symptoms of abuse/neglect since last No Secondary Verification Process Yes visito Completed: Hospitalized since last visit: No Patient Requires Transmission-Based No Pain Present Now: No Precautions: Patient Has Alerts: Yes Patient Alerts: aspirin Electronic Signature(s) Signed: 03/31/2015 5:20:32 PM By: Curtis Sitesorthy, Joanna Entered By: Curtis Sitesorthy, Joanna on 03/31/2015 13:51:47 [name], [name] ([id number]) -------------------------------------------------------------------------------- Encounter Discharge Information Details Patient Name: [name], [name] Date of Service: 03/31/2015 1:45 PM Medical Record Number: [id number] Patient Account Number: [id number] Date of Birth/Sex: 1965/06/29 (49 y.o. Female) Treating RN: Curtis Sitesorthy, Joanna Primary Care Physician: Yves DillKhan, Neelam Other Clinician: Referring Physician: Yves DillKhan, Neelam Treating Physician/Extender: BURNS III, Regis BillWALTER Weeks in Treatment: 10 Encounter Discharge Information Items Discharge Pain Level: 0 Discharge Condition: Stable Ambulatory Status: Ambulatory Discharge Destination: Home Transportation: Private  Auto Accompanied By: staff Schedule Follow-up Appointment: Yes Medication Reconciliation completed and provided to Patient/Care No Fredi Hurtado: Provided on Clinical Summary of Care: 03/31/2015 Form Type Recipient Paper Patient [place] Electronic Signature(s) Signed: 03/31/2015 4:02:04 PM By: Curtis Sitesorthy, Joanna Previous Signature: 03/31/2015 2:12:21 PM Version By: Gwenlyn PerkingMoore, Shelia Entered By: Curtis Sitesorthy, Joanna on 03/31/2015 16:02:04 [name], [name] ([id number]) -------------------------------------------------------------------------------- Lower Extremity Assessment Details Patient Name: [name], [name] Date of Service: 03/31/2015 1:45 PM Medical Record Number: [id number] Patient Account Number: [id number] Date of Birth/Sex: 1965/06/29 (49 y.o. Female) Treating RN: Curtis Sitesorthy, Joanna Primary Care Physician: Yves DillKhan, Neelam Other Clinician: Referring Physician: Yves DillKhan, Neelam Treating Physician/Extender: BURNS III, Regis BillWALTER Weeks in Treatment: 10 Vascular Assessment Pulses: Posterior Tibial Dorsalis Pedis Palpable: [Right:Yes] Extremity colors, hair growth, and conditions: Extremity Color: [Right:Normal] Hair Growth on Extremity: [Right:Yes] Temperature of Extremity: [Right:Warm] Capillary Refill: [Right:< 3 seconds] Toe Nail Assessment Left: Right: Thick: No Discolored: No Deformed: No Improper Length and Hygiene: No Electronic Signature(s) Signed: 03/31/2015 5:20:32 PM By: Curtis Sitesorthy, Joanna Entered By: Curtis Sitesorthy, Joanna on 03/31/2015 14:00:24 [name], [name] ([id number]) -------------------------------------------------------------------------------- Multi Wound Chart Details Patient Name: [name], [name] Date of Service: 03/31/2015 1:45 PM Medical Record Number: [id number] Patient Account Number: [id number] Date of Birth/Sex: 1965/06/29 (49 y.o. Female) Treating RN: Curtis Sitesorthy, Joanna Primary Care Physician: Yves DillKhan, Neelam Other Clinician: Referring Physician: Yves DillKhan, Neelam Treating  Physician/Extender: BURNS III, Regis BillWALTER Weeks in Treatment: 10 Vital Signs Height(in): 64 Pulse(bpm): 83 Weight(lbs): 215 Blood Pressure 116/73 (mmHg): Body Mass Index(BMI): 37 Temperature(F): 98.6 Respiratory Rate 20 (breaths/min): Photos: [1:No Photos] [N/A:N/A] Wound Location: [1:Right Lower Leg - Medial] [N/A:N/A] Wounding Event: [1:Bite] [N/A:N/A] Primary Etiology: [1:Infection - not elsewhere classified] [N/A:N/A] Date Acquired: [1:12/28/2014] [N/A:N/A] Weeks of Treatment: [1:10] [N/A:N/A] Wound Status: [1:Open] [N/A:N/A] Measurements L x W x D 4.5x4.5x0.1 [N/A:N/A] (cm) Area (cm) : [1:15.904] [N/A:N/A] Volume (cm) : [1:1.59] [N/A:N/A] % Reduction in Area: [1:-170.00%] [N/A:N/A] % Reduction in Volume: -169.90% [N/A:N/A] Classification: [1:Full Thickness Without Exposed Support Structures] [N/A:N/A] Exudate Amount: [1:Large] [N/A:N/A] Exudate Type: [1:Serosanguineous] [N/A:N/A] Exudate Color: [1:red, brown] [N/A:N/A] Wound Margin: [1:Flat and Intact] [N/A:N/A] Granulation Amount: [1:Large (67-100%)] [N/A:N/A] Granulation Quality: [1:Red] [N/A:N/A]  Necrotic Amount: [1:Small (1-33%)] [N/A:N/A] Exposed Structures: [1:Fascia: No Fat: No Tendon: No Muscle: No Joint: No Bone: No] [N/A:N/A] Limited to Skin Breakdown Epithelialization: None N/A N/A Periwound Skin Texture: Edema: Yes N/A N/A Scarring: Yes Excoriation: No Induration: No Callus: No Crepitus: No Fluctuance: No Friable: No Rash: No Periwound Skin Maceration: Yes N/A N/A Moisture: Moist: Yes Dry/Scaly: No Periwound Skin Color: Atrophie Blanche: No N/A N/A Cyanosis: No Ecchymosis: No Erythema: No Hemosiderin Staining: No Mottled: No Pallor: No Rubor: No Temperature: No Abnormality N/A N/A Tenderness on Yes N/A N/A Palpation: Wound Preparation: Ulcer Cleansing: N/A N/A Rinsed/Irrigated with Saline Topical Anesthetic Applied: Other: lidocaine 4% Treatment Notes Electronic  Signature(s) Signed: 03/31/2015 5:20:32 PM By: Curtis Sites Entered By: Curtis Sites on 03/31/2015 14:00:38 [name] ([id number]) -------------------------------------------------------------------------------- Multi-Disciplinary Care Plan Details Patient Name: [name] Date of Service: 03/31/2015 1:45 PM Medical Record Number: [id number] Patient Account Number: [id number] Date of Birth/Sex: 1965/09/03 (49 y.o. Female) Treating RN: Curtis Sites Primary Care Physician: Yves Dill Other Clinician: Referring Physician: Yves Dill Treating Physician/Extender: BURNS III, Regis Bill in Treatment: 10 Active Inactive Orientation to the Wound Care Program Nursing Diagnoses: Knowledge deficit related to the wound healing center program Goals: Patient/caregiver will verbalize understanding of the Wound Healing Center Program Date Initiated: 01/20/2015 Goal Status: Active Interventions: Provide education on orientation to the wound center Notes: Soft Tissue Infection Nursing Diagnoses: Potential for infection: soft tissue Goals: Patient will remain free of wound infection Date Initiated: 01/20/2015 Goal Status: Active Interventions: Assess signs and symptoms of infection every visit Treatment Activities: Culture and sensitivity : 03/31/2015 Notes: Wound/Skin Impairment Nursing Diagnoses: Impaired tissue integrity AMYRE, SEGUNDO ([id number]) Goals: Ulcer/skin breakdown will heal within 14 weeks Date Initiated: 01/20/2015 Goal Status: Active Interventions: Assess patient/caregiver ability to obtain necessary supplies Notes: Electronic Signature(s) Signed: 03/31/2015 5:20:32 PM By: Curtis Sites Entered By: Curtis Sites on 03/31/2015 14:00:31 [name] ([id number]) -------------------------------------------------------------------------------- Patient/Caregiver Education Details Patient Name: [name] Date of Service: 03/31/2015  1:45 PM Medical Record Number: [id number] Patient Account Number: [id number] Date of Birth/Gender: Jun 30, 1965 (49 y.o. Female) Treating RN: Curtis Sites Primary Care Physician: Yves Dill Other Clinician: Referring Physician: Yves Dill Treating Physician/Extender: BURNS III, Regis Bill in Treatment: 10 Education Assessment Education Provided To: Patient Education Topics Provided Venous: Handouts: Controlling Swelling with Multilayered Compression Wraps Methods: Printed Responses: State content correctly Electronic Signature(s) Signed: 03/31/2015 4:03:04 PM By: Curtis Sites Entered By: Curtis Sites on 03/31/2015 16:03:04 [name] ([id number]) -------------------------------------------------------------------------------- Wound Assessment Details Patient Name: [name] Date of Service: 03/31/2015 1:45 PM Medical Record Number: [id number] Patient Account Number: [id number] Date of Birth/Sex: 11-10-1965 (49 y.o. Female) Treating RN: Curtis Sites Primary Care Physician: Yves Dill Other Clinician: Referring Physician: Yves Dill Treating Physician/Extender: BURNS III, Regis Bill in Treatment: 10 Wound Status Wound Number: 1 Primary Etiology: Infection - not elsewhere classified Wound Location: Right Lower Leg - Medial Wound Status: Open Wounding Event: Bite Date Acquired: 12/28/2014 Weeks Of Treatment: 10 Clustered Wound: No Photos Photo Uploaded By: Curtis Sites on 03/31/2015 17:15:42 Wound Measurements Length: (cm) 4.5 Width: (cm) 4.5 Depth: (cm) 0.1 Area: (cm) 15.904 Volume: (cm) 1.59 % Reduction in Area: -170% % Reduction in Volume: -169.9% Epithelialization: None Tunneling: No Undermining: No Wound Description Full Thickness Without Exposed Classification: Support Structures Wound Margin: Flat and Intact Exudate Large Amount: Exudate Type: Serosanguineous Exudate Color: red, brown Foul Odor After Cleansing:  No Wound Bed Granulation Amount: Large (67-100%) Exposed Structure Granulation Quality: Red Fascia Exposed: No Necrotic Amount: Small (1-33%)  Fat Layer Exposed: No ELISABEL, HANOVER ([id number]) Necrotic Quality: Adherent Slough Tendon Exposed: No Muscle Exposed: No Joint Exposed: No Bone Exposed: No Limited to Skin Breakdown Periwound Skin Texture Texture Color No Abnormalities Noted: No No Abnormalities Noted: No Callus: No Atrophie Blanche: No Crepitus: No Cyanosis: No Excoriation: No Ecchymosis: No Fluctuance: No Erythema: No Friable: No Hemosiderin Staining: No Induration: No Mottled: No Localized Edema: Yes Pallor: No Rash: No Rubor: No Scarring: Yes Temperature / Pain Moisture Temperature: No Abnormality No Abnormalities Noted: No Tenderness on Palpation: Yes Dry / Scaly: No Maceration: Yes Moist: Yes Wound Preparation Ulcer Cleansing: Rinsed/Irrigated with Saline Topical Anesthetic Applied: Other: lidocaine 4%, Treatment Notes Wound #1 (Right, Medial Lower Leg) 1. Cleansed with: Clean wound with Normal Saline 2. Anesthetic Topical Lidocaine 4% cream to wound bed prior to debridement 4. Dressing Applied: Aquacel Ag 5. Secondary Dressing Applied Foam 7. Secured with 3 Layer Compression System - Right Lower Extremity Electronic Signature(s) Signed: 03/31/2015 5:20:32 PM By: Curtis Sites Entered By: Curtis Sites on 03/31/2015 13:59:17 [name] ([id number]) -------------------------------------------------------------------------------- Vitals Details Patient Name: [name] Date of Service: 03/31/2015 1:45 PM Medical Record Number: [id number] Patient Account Number: [id number] Date of Birth/Sex: 09-23-1965 (49 y.o. Female) Treating RN: Curtis Sites Primary Care Physician: Yves Dill Other Clinician: Referring Physician: Yves Dill Treating Physician/Extender: BURNS III, Regis Bill in Treatment: 10 Vital Signs Time  Taken: 13:52 Temperature (F): 98.6 Height (in): 64 Pulse (bpm): 83 Weight (lbs): 215 Respiratory Rate (breaths/min): 20 Body Mass Index (BMI): 36.9 Blood Pressure (mmHg): 116/73 Reference Range: 80 - 120 mg / dl Electronic Signature(s) Signed: 03/31/2015 5:20:32 PM By: Curtis Sites Entered By: Curtis Sites on 03/31/2015 13:53:07

## 2015-04-01 NOTE — Progress Notes (Signed)
Green, PRIEN ([id number]) Visit Report for 03/31/2015 Chief Complaint Document Details Patient Name: [name], [name] Date of Service: 03/31/2015 1:45 PM Medical Record Patient Account Number: [id number] Number: Treating RN: Curtis Sites [birth year] (49 y.o. Other Clinician: Date of Birth/Sex: Female) Treating BURNS III, Primary Care Physician: Yves Dill Physician/Extender: Zollie Beckers Referring Physician: Blain Pais in Treatment: 10 Information Obtained from: Patient Chief Complaint Right calf ulcer, status post I and D of abscess. Electronic Signature(s) Signed: 03/31/2015 3:49:04 PM By: Madelaine Bhat MD Entered By: Madelaine Bhat on 03/31/2015 14:33:40 [name] ([id number]) -------------------------------------------------------------------------------- Debridement Details Patient Name: [name] Date of Service: 03/31/2015 1:45 PM Medical Record Patient Account Number: [id number] Number: Treating RN: Curtis Sites Aug 13, 1965 (49 y.o. Other Clinician: Date of Birth/Sex: Female) Treating BURNS III, Primary Care Physician: Yves Dill Physician/Extender: Zollie Beckers Referring Physician: Blain Pais in Treatment: 10 Debridement Performed for Wound #1 Right,Medial Lower Leg Assessment: Performed By: Physician BURNS III, Melanie Crazier., MD Debridement: Debridement Pre-procedure Yes Verification/Time Out Taken: Start Time: 14:07 Pain Control: Lidocaine 4% Topical Solution Level: Skin/Subcutaneous Tissue Total Area Debrided (L x 4.5 (cm) x 4.5 (cm) = 20.25 (cm) W): Tissue and other Viable, Non-Viable, Fat, Fibrin/Slough, Subcutaneous material debrided: Instrument: Curette Bleeding: Minimum Hemostasis Achieved: Pressure End Time: 14:08 Procedural Pain: 0 Post Procedural Pain: 0 Response to Treatment: Procedure was tolerated well Post Debridement Measurements of Total Wound Length: (cm) 4.5 Width: (cm)  4.5 Depth: (cm) 0.2 Volume: (cm) 3.181 Post Procedure Diagnosis Same as Pre-procedure Electronic Signature(s) Signed: 03/31/2015 3:49:04 PM By: Madelaine Bhat MD Signed: 03/31/2015 5:20:32 PM By: Curtis Sites Entered By: Madelaine Bhat on 03/31/2015 14:33:11 [name] ([id number]) -------------------------------------------------------------------------------- HPI Details Patient Name: [name] Date of Service: 03/31/2015 1:45 PM Medical Record Patient Account Number: [id number] Number: Treating RN: Curtis Sites Aug 27, 1965 (49 y.o. Other Clinician: Date of Birth/Sex: Female) Treating BURNS III, Primary Care Physician: Yves Dill Physician/Extender: Zollie Beckers Referring Physician: Blain Pais in Treatment: 10 History of Present Illness HPI Description: Pleasant 49 year old with history of borderline personality disorder and CP. No history of diabetes or peripheral arterial disease. Right ABI 1.2. She developed an ulceration on her right anterior calf secondary to an insect bite. This progressed to an abscess, and she underwent I and D on 01/02/2015 by Dr. Michela Pitcher. Cultures grew oxacillin sensitive staph aureus. She completed a course of clindamycin. She lives in a group home. Performing dressing changes with silver alginate. Using Ace wrap for edema control. Has declined compression bandage, Tubigrip. Punch biopsy 03/24/2015 showed no evidence for malignancy. Negative for granulomatous inflammation and vasculitis. She returns to clinic for follow-up and is without new complaints. No significant pain. No fever or chills. Minimal drainage. Electronic Signature(s) Signed: 03/31/2015 3:49:04 PM By: Madelaine Bhat MD Entered By: Madelaine Bhat on 03/31/2015 14:34:58 [name] ([id number]) -------------------------------------------------------------------------------- Physical Exam Details Patient Name: [name] Date of Service: 03/31/2015 1:45 PM Medical Record Patient Account Number: [id number] Number: Treating RN: Curtis Sites 03/03/1966 (49 y.o. Other Clinician: Date of Birth/Sex: Female) Treating BURNS III, Primary Care Physician: Yves Dill Physician/Extender: Zollie Beckers Referring Physician: Yves Dill Weeks in Treatment: 10 Constitutional . Pulse regular. Respirations normal and unlabored. Afebrile. . Notes Right anterior calf ulceration unchanged. Full-thickness. No significant cellulitis. No residual abscess. Minimal edema. Palpable DP. Right ABI 1.2. Electronic Signature(s) Signed: 03/31/2015 3:49:04 PM By: Madelaine Bhat MD Entered By: Madelaine Bhat on 03/31/2015 14:35:29 [name] ([id number]) -------------------------------------------------------------------------------- Physician Orders Details Patient Name:  [name] Date of Service: 03/31/2015 1:45 PM Medical Record Patient Account Number: [id number] Number: Treating RN: Curtis Sites 05-Sep-1965 (49 y.o. Other Clinician: Date of Birth/Sex: Female) Treating BURNS III, Primary Care Physician: Yves Dill Physician/Extender: Zollie Beckers Referring Physician: Blain Pais in Treatment: 10 Verbal / Phone Orders: Yes Clinician: Curtis Sites Read Back and Verified: Yes Diagnosis Coding Wound Cleansing Wound #1 Right,Medial Lower Leg o Clean wound with Normal Saline. o May Shower, gently pat wound dry prior to applying new dressing. Anesthetic Wound #1 Right,Medial Lower Leg o Topical Lidocaine 4% cream applied to wound bed prior to debridement Skin Barriers/Peri-Wound Care Wound #1 Right,Medial Lower Leg o Barrier cream - on peri-wound Primary Wound Dressing Wound #1 Right,Medial Lower Leg o Aquacel Ag Secondary Dressing Wound #1 Right,Medial Lower Leg o ABD pad o Foam - if needed for drainage Dressing Change Frequency Wound #1  Right,Medial Lower Leg o Change dressing every other day. - Three times weekly Follow-up Appointments Wound #1 Right,Medial Lower Leg o Return Appointment in 1 week. Edema Control Wound #1 Right,Medial Lower Leg o 3 Layer Compression System - Right Lower Extremity KELIN, NIXON ([id number]) Additional Orders / Instructions Wound #1 Right,Medial Lower Leg o Increase protein intake. o Activity as tolerated Home Health Wound #1 Right,Medial Lower Leg o Continue Home Health Visits - [facility] o Home Health Nurse may visit PRN to address patientos wound care needs. o FACE TO FACE ENCOUNTER: MEDICARE and MEDICAID PATIENTS: I certify that this patient is under my care and that I had a face-to-face encounter that meets the physician face-to-face encounter requirements with this patient on this date. The encounter with the patient was in whole or in part for the following MEDICAL CONDITION: (primary reason for Home Healthcare) MEDICAL NECESSITY: I certify, that based on my findings, NURSING services are a medically necessary home health service. HOME BOUND STATUS: I certify that my clinical findings support that this patient is homebound (i.e., Due to illness or injury, pt requires aid of supportive devices such as crutches, cane, wheelchairs, walkers, the use of special transportation or the assistance of another person to leave their place of residence. There is a normal inability to leave the home and doing so requires considerable and taxing effort. Other absences are for medical reasons / religious services and are infrequent or of short duration when for other reasons). o If current dressing causes regression in wound condition, may D/C ordered dressing product/s and apply Normal Saline Moist Dressing daily until next Wound Healing Center / Other MD appointment. Notify Wound Healing Center of regression in wound condition at [phone]. o Please direct any  NON-WOUND related issues/requests for orders to patient's Primary Care Physician Electronic Signature(s) Signed: 03/31/2015 3:49:04 PM By: Madelaine Bhat MD Signed: 03/31/2015 5:20:32 PM By: Curtis Sites Entered By: Curtis Sites on 03/31/2015 14:10:13 [name] ([id number]) -------------------------------------------------------------------------------- Problem List Details Patient Name: [name] Date of Service: 03/31/2015 1:45 PM Medical Record Patient Account Number: [id number] Number: Treating RN: Curtis Sites 1965/06/22 (49 y.o. 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 S81.801S Unspecified open wound, right lower leg, sequela 01/20/2015 Yes F60.3 Borderline personality disorder 01/20/2015 Yes Inactive Problems Resolved Problems Electronic Signature(s) Signed: 03/31/2015 3:49:04 PM By: Madelaine Bhat MD Entered By: Reino Bellis,  Mairen Wallenstein on 03/31/2015 14:32:52 BRIAN, KOCOUREK ([id number]) -------------------------------------------------------------------------------- Progress Note Details Patient Name: ELORA, WOLTER Date of Service: 03/31/2015 1:45 PM Medical Record Patient Account Number: [id number] Number: Treating RN: Curtis Sites 1965/04/20 (49 y.o. Other Clinician: Date of Birth/Sex: Female) Treating BURNS III, Primary Care Physician: Yves Dill Physician/Extender: Zollie Beckers Referring Physician: Blain Pais in Treatment: 10 Subjective Chief Complaint Information obtained from Patient Right calf ulcer, status post I and D of abscess. History of Present Illness (HPI) Pleasant 49 year old with history of  borderline personality disorder and CP. No history of diabetes or peripheral arterial disease. Right ABI 1.2. She developed an ulceration on her right anterior calf secondary to an insect bite. This progressed to an abscess, and she underwent I and D on 01/02/2015 by Dr. Michela Pitcher. Cultures grew oxacillin sensitive staph aureus. She completed a course of clindamycin. She lives in a group home. Performing dressing changes with silver alginate. Using Ace wrap for edema control. Has declined compression bandage, Tubigrip. Punch biopsy 03/24/2015 showed no evidence for malignancy. Negative for granulomatous inflammation and vasculitis. She returns to clinic for follow-up and is without new complaints. No significant pain. No fever or chills. Minimal drainage. Objective Constitutional Pulse regular. Respirations normal and unlabored. Afebrile. Vitals Time Taken: 1:52 PM, Height: 64 in, Weight: 215 lbs, BMI: 36.9, Temperature: 98.6 F, Pulse: 83 bpm, Respiratory Rate: 20 breaths/min, Blood Pressure: 116/73 mmHg. General Notes: Right anterior calf ulceration unchanged. Full-thickness. No significant cellulitis. No residual abscess. Minimal edema. Palpable DP. Right ABI 1.2. JASILYN, HOLDERMAN ([id number]) Integumentary (Hair, Skin) Wound #1 status is Open. Original cause of wound was Bite. The wound is located on the Right,Medial Lower Leg. The wound measures 4.5cm length x 4.5cm width x 0.1cm depth; 15.904cm^2 area and 1.59cm^3 volume. The wound is limited to skin breakdown. There is no tunneling or undermining noted. There is a large amount of serosanguineous drainage noted. The wound margin is flat and intact. There is large (67-100%) red granulation within the wound bed. There is a small (1-33%) amount of necrotic tissue within the wound bed including Adherent Slough. The periwound skin appearance exhibited: Localized Edema, Scarring, Maceration, Moist. The periwound skin appearance did not exhibit:  Callus, Crepitus, Excoriation, Fluctuance, Friable, Induration, Rash, Dry/Scaly, Atrophie Blanche, Cyanosis, Ecchymosis, Hemosiderin Staining, Mottled, Pallor, Rubor, Erythema. Periwound temperature was noted as No Abnormality. The periwound has tenderness on palpation. Assessment Active Problems ICD-10 L97.212 - Non-pressure chronic ulcer of right calf with fat layer exposed S81.801S - Unspecified open wound, right lower leg, sequela F60.3 - Borderline personality disorder Right calf traumatic ulceration. Procedures Wound #1 Wound #1 is an Infection - not elsewhere classified located on the Right,Medial Lower Leg . There was a Skin/Subcutaneous Tissue Debridement ([id number]) debridement with total area of 20.25 sq cm performed by BURNS III, Melanie Crazier., MD. with the following instrument(s): Curette to remove Viable and Non-Viable tissue/material including Fat, Fibrin/Slough, and Subcutaneous after achieving pain control using Lidocaine 4% Topical Solution. A time out was conducted prior to the start of the procedure. A Minimum amount of bleeding was controlled with Pressure. The procedure was tolerated well with a pain level of 0 throughout and a pain level of 0 following the procedure. Post Debridement Measurements: 4.5cm length x 4.5cm width x 0.2cm depth; 3.181cm^3 volume. Post procedure Diagnosis Wound #1: Same as Pre-Procedure TAMLA, WINKELS ([id number]) Plan Wound Cleansing: Wound #1 Right,Medial Lower Leg: Clean wound with Normal Saline. May Shower, gently pat wound dry prior to applying new dressing. Anesthetic: Wound #1  Right,Medial Lower Leg: Topical Lidocaine 4% cream applied to wound bed prior to debridement Skin Barriers/Peri-Wound Care: Wound #1 Right,Medial Lower Leg: Barrier cream - on peri-wound Primary Wound Dressing: Wound #1 Right,Medial Lower Leg: Aquacel Ag Secondary Dressing: Wound #1 Right,Medial Lower Leg: ABD pad Foam - if needed for  drainage Dressing Change Frequency: Wound #1 Right,Medial Lower Leg: Change dressing every other day. - Three times weekly Follow-up Appointments: Wound #1 Right,Medial Lower Leg: Return Appointment in 1 week. Edema Control: Wound #1 Right,Medial Lower Leg: 3 Layer Compression System - Right Lower Extremity Additional Orders / Instructions: Wound #1 Right,Medial Lower Leg: Increase protein intake. Activity as tolerated Home Health: Wound #1 Right,Medial Lower Leg: Continue Home Health Visits - [facility] Nurse may visit PRN to address patient s wound care needs. FACE TO FACE ENCOUNTER: MEDICARE and MEDICAID PATIENTS: I certify that this patient is under my care and that I had a face-to-face encounter that meets the physician face-to-face encounter requirements with this patient on this date. The encounter with the patient was in whole or in part for the following MEDICAL CONDITION: (primary reason for Home Healthcare) MEDICAL NECESSITY: I certify, that based on my findings, NURSING services are a medically necessary home health service. HOME BOUND STATUS: I certify that my clinical findings support that this patient is homebound (i.e., Due to illness or injury, pt requires aid of supportive devices such as crutches, cane, wheelchairs, walkers, the use of special transportation or the assistance of another person to leave their place of residence. There is a normal inability to leave the home and doing so requires considerable and taxing effort. Other absences are for medical reasons / religious services and are infrequent or of short duration when for other reasons). If current dressing causes regression in wound condition, may D/C ordered dressing product/s and apply Normal Saline Moist Dressing daily until next Wound Healing Center / Other MD appointment. Notify Wound Healing Center of regression in wound condition at [phone]. Please direct any NON-WOUND related  issues/requests for orders to patient's Primary Care Physician Sherri DonningSHUMATE, Iyona ([id number]) Silver alginate. We'll try compression bandage to be changed by home health on Friday and Monday. Electronic Signature(s) Signed: 03/31/2015 3:49:04 PM By: Madelaine BhatBurns, III, Joyanne Eddinger MD Entered By: Madelaine BhatBurns, III, Kortnie Stovall on 03/31/2015 14:36:09 Sherri DonningSHUMATE, Gisella ([id number]) -------------------------------------------------------------------------------- SuperBill Details Patient Name: Sherri DonningSHUMATE, Fatuma Date of Service: 03/31/2015 Medical Record Patient Account Number: [id number] Number: Treating RN: Curtis SitesDorthy, Joanna 1965-09-08 (49 y.o. Other Clinician: Date of Birth/Sex: Female) Treating BURNS III, Primary Care Physician: Yves DillKhan, Neelam Physician/Extender: Zollie BeckersWALTER Referring Physician: Yves DillKhan, Neelam Weeks in Treatment: 10 Diagnosis Coding ICD-10 Codes Code Description 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 with fat layer exposed S81.801S Unspecified open wound, right lower leg, sequela F60.3 Borderline personality disorder Facility Procedures CPT4 Code: [id number] Description: 11042 - DEB SUBQ TISSUE 20 SQ CM/< ICD-10 Description Diagnosis L97.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 with fat Modifier: layer exposed Quantity: 1 CPT4 Code: [id number] Description: 11045 - DEB SUBQ TISS EA ADDL 20CM ICD-10 Description Diagnosis L97.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 with fat Modifier: layer exposed Quantity: 1 Physician Procedures CPT4 Code: [id number] Description: 11042 - WC PHYS SUBQ TISS 20 SQ CM ICD-10 Description Diagnosis L97.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 with fat l Modifier: ayer exposed Quantity: 1 CPT4 Code: [id number] Description: 11045 - WC PHYS SUBQ TISS EA ADDL 20 CM ICD-10 Description Diagnosis L97.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 with fat l Modifier: ayer exposed Quantity: 1  Electronic Signature(s) Signed: 03/31/2015 3:49:04 PM By: Madelaine Bhat MD [place], [name] ([id number]) Entered By: Madelaine Bhat on 03/31/2015 14:36:27

## 2015-04-07 ENCOUNTER — Other Ambulatory Visit
Admission: RE | Admit: 2015-04-07 | Discharge: 2015-04-07 | Disposition: A | Discharge status: Home / Self Care | Payer: Medicare Other | Source: Ambulatory Visit | Attending: Surgery | Admitting: Surgery

## 2015-04-07 ENCOUNTER — Encounter: Payer: Medicare Other | Admitting: Surgery

## 2015-04-07 DIAGNOSIS — L089 Local infection of the skin and subcutaneous tissue, unspecified: Secondary | ICD-10-CM | POA: Insufficient documentation

## 2015-04-07 DIAGNOSIS — L97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 with fat layer exposed: Secondary | ICD-10-CM | POA: Diagnosis not present

## 2015-04-08 NOTE — Progress Notes (Signed)
RANETTE, LUCKADOO ([id number]) Visit Report for 04/07/2015 Arrival Information Details Patient Name: [name], [name] Date of Service: 04/07/2015 9:45 AM Medical Record Number: [id number] Patient Account Number: [id number] Date of Birth/Sex: May 14, 1965 (49 y.o. Female) Treating RN: Phillis Haggis Primary Care Physician: Yves Dill Other Clinician: Referring Physician: Yves Dill Treating Physician/Extender: BURNS III, Regis Bill in Treatment: 11 Visit Information History Since Last Visit All ordered tests and consults were completed: No Patient Arrived: Ambulatory Added or deleted any medications: No Arrival Time: 09:43 Any new allergies or adverse reactions: No Accompanied By: caregiver Had a fall or experienced change in No Transfer Assistance: None activities of daily living that may affect Patient Identification Verified: Yes risk of falls: Secondary Verification Process Yes Signs or symptoms of abuse/neglect since last No Completed: visito Patient Requires Transmission-Based No Hospitalized since last visit: No Precautions: Pain Present Now: No Patient Has Alerts: Yes Patient Alerts: aspirin Electronic Signature(s) Signed: 04/07/2015 4:19:08 PM By: Alejandro Mulling Entered By: Alejandro Mulling on 04/07/2015 09:44:11 [name] ([id number]) -------------------------------------------------------------------------------- Clinic Level of Care Assessment Details Patient Name: [name] Date of Service: 04/07/2015 9:45 AM Medical Record Number: [id number] Patient Account Number: [id number] Date of Birth/Sex: 1966-02-21 (49 y.o. Female) Treating RN: Ashok Cordia, Debi Primary Care Physician: Yves Dill Other Clinician: Referring Physician: Yves Dill Treating Physician/Extender: BURNS III, Regis Bill in Treatment: 11 Clinic Level of Care Assessment Items TOOL 4 Quantity Score  - Use when only an EandM is performed on FOLLOW-UP visit  0 ASSESSMENTS - Nursing Assessment / Reassessment  - Reassessment of Co-morbidities (includes updates in patient status) 0 X - Reassessment of Adherence to Treatment Plan 1 5 ASSESSMENTS - Wound and Skin Assessment / Reassessment X - Simple Wound Assessment / Reassessment - one wound 1 5  - Complex Wound Assessment / Reassessment - multiple wounds 0  - Dermatologic / Skin Assessment (not related to wound area) 0 ASSESSMENTS - Focused Assessment  - Circumferential Edema Measurements - multi extremities 0  - Nutritional Assessment / Counseling / Intervention 0  - Lower Extremity Assessment (monofilament, tuning fork, pulses) 0  - Peripheral Arterial Disease Assessment (using hand held doppler) 0 ASSESSMENTS - Ostomy and/or Continence Assessment and Care  - Incontinence Assessment and Management 0  - Ostomy Care Assessment and Management (repouching, etc.) 0 PROCESS - Coordination of Care X - Simple Patient / Family Education for ongoing care 1 15  - Complex (extensive) Patient / Family Education for ongoing care 0  - Staff obtains Chiropractor, Records, Test Results / Process Orders 0  - Staff telephones HHA, Nursing Homes / Clarify orders / etc 0  - Routine Transfer to another Facility (non-emergent condition) 0 [name], [name] ([id number])  - Routine Hospital Admission (non-emergent condition) 0  - New Admissions / Manufacturing engineer / Ordering NPWT, Apligraf, etc. 0  - Emergency Hospital Admission (emergent condition) 0 X - Simple Discharge Coordination 1 10  - Complex (extensive) Discharge Coordination 0 PROCESS - Special Needs  - Pediatric / Minor Patient Management 0  - Isolation Patient Management 0  - Hearing / Language / Visual special needs 0  - Assessment of Community assistance (transportation, D/C planning, etc.) 0  - Additional assistance / Altered mentation 0  - Support Surface(s) Assessment (bed, cushion, seat, etc.)  0 INTERVENTIONS - Wound Cleansing / Measurement X - Simple Wound Cleansing - one wound 1 5  - Complex Wound Cleansing - multiple wounds 0 X - Wound Imaging (photographs - any number of wounds) 1 5  - Wound  Tracing (instead of photographs) 0 X - Simple Wound Measurement - one wound 1 5  - Complex Wound Measurement - multiple wounds 0 INTERVENTIONS - Wound Dressings  - Small Wound Dressing one or multiple wounds 0 X - Medium Wound Dressing one or multiple wounds 1 15  - Large Wound Dressing one or multiple wounds 0 X - Application of Medications - topical 1 5  - Application of Medications - injection 0 INTERVENTIONS - Miscellaneous  - External ear exam 0 [name], [name] ([id number])  - Specimen Collection (cultures, biopsies, blood, body fluids, etc.) 0  - Specimen(s) / Culture(s) sent or taken to Lab for analysis 0  - Patient Transfer (multiple staff / Michiel Sites Lift / Similar devices) 0  - Simple Staple / Suture removal (25 or less) 0  - Complex Staple / Suture removal (26 or more) 0  - Hypo / Hyperglycemic Management (close monitor of Blood Glucose) 0  - Ankle / Brachial Index (ABI) - do not check if billed separately 0 X - Vital Signs 1 5 Has the patient been seen at the hospital within the last three years: Yes Total Score: 75 Level Of Care: New/Established - Level 2 Electronic Signature(s) Signed: 04/07/2015 4:19:08 PM By: Alejandro Mulling Entered By: Alejandro Mulling on 04/07/2015 10:15:12 [name] ([id number]) -------------------------------------------------------------------------------- Encounter Discharge Information Details Patient Name: [name] Date of Service: 04/07/2015 9:45 AM Medical Record Number: [id number] Patient Account Number: [id number] Date of Birth/Sex: 08-02-1965 (49 y.o. Female) Treating RN: Huel Coventry Primary Care Physician: Yves Dill Other Clinician: Referring Physician: Yves Dill Treating  Physician/Extender: BURNS III, Regis Bill in Treatment: 11 Encounter Discharge Information Items Discharge Pain Level: 0 Discharge Condition: Stable Ambulatory Status: Ambulatory Discharge Destination: Home Transportation: Ambulance Accompanied By: caregiver Schedule Follow-up Appointment: Yes Medication Reconciliation completed and provided to Patient/Care Yes Anuar Walgren: Provided on Clinical Summary of Care: 04/07/2015 Form Type Recipient Paper Patient [place] Electronic Signature(s) Signed: 04/07/2015 4:19:08 PM By: Alejandro Mulling Previous Signature: 04/07/2015 10:16:00 AM Version By: Gwenlyn Perking Entered By: Alejandro Mulling on 04/07/2015 10:18:46 [name] ([id number]) -------------------------------------------------------------------------------- Lower Extremity Assessment Details Patient Name: [name] Date of Service: 04/07/2015 9:45 AM Medical Record Number: [id number] Patient Account Number: [id number] Date of Birth/Sex: 03-08-1966 (49 y.o. Female) Treating RN: Phillis Haggis Primary Care Physician: Yves Dill Other Clinician: Referring Physician: Yves Dill Treating Physician/Extender: BURNS III, Regis Bill in Treatment: 11 Vascular Assessment Pulses: Posterior Tibial Dorsalis Pedis Palpable: [Right:Yes] Extremity colors, hair growth, and conditions: Hair Growth on Extremity: [Right:Yes] Temperature of Extremity: [Right:Warm] Capillary Refill: [Right:< 3 seconds] Toe Nail Assessment Left: Right: Thick: No Discolored: No Deformed: No Improper Length and Hygiene: No Electronic Signature(s) Signed: 04/07/2015 4:19:08 PM By: Alejandro Mulling Entered By: Alejandro Mulling on 04/07/2015 09:47:37 [name] ([id number]) -------------------------------------------------------------------------------- Multi Wound Chart Details Patient Name: [name] Date of Service: 04/07/2015 9:45 AM Medical Record Number:  [id number] Patient Account Number: [id number] Date of Birth/Sex: Nov 09, 1965 (49 y.o. Female) Treating RN: Phillis Haggis Primary Care Physician: Yves Dill Other Clinician: Referring Physician: Yves Dill Treating Physician/Extender: BURNS III, Regis Bill in Treatment: 11 Vital Signs Height(in): 64 Pulse(bpm): 87 Weight(lbs): 215 Blood Pressure 140/90 (mmHg): Body Mass Index(BMI): 37 Temperature(F): 98.5 Respiratory Rate 20 (breaths/min): Photos: [1:No Photos] [N/A:N/A] Wound Location: [1:Right Lower Leg - Medial] [N/A:N/A] Wounding Event: [1:Bite] [N/A:N/A] Primary Etiology: [1:Infection - not elsewhere classified] [N/A:N/A] Date Acquired: [1:12/28/2014] [N/A:N/A] Weeks of Treatment: [1:11] [N/A:N/A] Wound Status: [1:Open] [N/A:N/A] Measurements L x W x D 4.1x4.5x0.1 [N/A:N/A] (cm) Area (cm) : [1:14.491] [N/A:N/A]  Volume (cm) : [1:1.449] [N/A:N/A] % Reduction in Area: [1:-146.00%] [N/A:N/A] % Reduction in Volume: -146.00% [N/A:N/A] Classification: [1:Full Thickness Without Exposed Support Structures] [N/A:N/A] Exudate Amount: [1:Large] [N/A:N/A] Exudate Type: [1:Serosanguineous] [N/A:N/A] Exudate Color: [1:red, brown] [N/A:N/A] Wound Margin: [1:Flat and Intact] [N/A:N/A] Granulation Amount: [1:Medium (34-66%)] [N/A:N/A] Granulation Quality: [1:Red] [N/A:N/A] Necrotic Amount: [1:Medium (34-66%)] [N/A:N/A] Exposed Structures: [1:Fascia: No Fat: No Tendon: No Muscle: No Joint: No Bone: No] [N/A:N/A] Limited to Skin Breakdown Epithelialization: None N/A N/A Periwound Skin Texture: Edema: Yes N/A N/A Scarring: Yes Excoriation: No Induration: No Callus: No Crepitus: No Fluctuance: No Friable: No Rash: No Periwound Skin Moist: Yes N/A N/A Moisture: Maceration: No Dry/Scaly: No Periwound Skin Color: Atrophie Blanche: No N/A N/A Cyanosis: No Ecchymosis: No Erythema: No Hemosiderin Staining: No Mottled: No Pallor: No Rubor: No Temperature: No  Abnormality N/A N/A Tenderness on Yes N/A N/A Palpation: Wound Preparation: Ulcer Cleansing: N/A N/A Rinsed/Irrigated with Saline Topical Anesthetic Applied: Other: lidocaine 4% Treatment Notes Electronic Signature(s) Signed: 04/07/2015 4:19:08 PM By: Alejandro Mulling Entered By: Alejandro Mulling on 04/07/2015 09:49:57 [name] ([id number]) -------------------------------------------------------------------------------- Multi-Disciplinary Care Plan Details Patient Name: [name] Date of Service: 04/07/2015 9:45 AM Medical Record Number: [id number] Patient Account Number: [id number] Date of Birth/Sex: January 04, 1966 (49 y.o. Female) Treating RN: Phillis Haggis Primary Care Physician: Yves Dill Other Clinician: Referring Physician: Yves Dill Treating Physician/Extender: BURNS III, Regis Bill in Treatment: 11 Active Inactive Orientation to the Wound Care Program Nursing Diagnoses: Knowledge deficit related to the wound healing center program Goals: Patient/caregiver will verbalize understanding of the Wound Healing Center Program Date Initiated: 01/20/2015 Goal Status: Active Interventions: Provide education on orientation to the wound center Notes: Soft Tissue Infection Nursing Diagnoses: Potential for infection: soft tissue Goals: Patient will remain free of wound infection Date Initiated: 01/20/2015 Goal Status: Active Interventions: Assess signs and symptoms of infection every visit Treatment Activities: Culture and sensitivity : 04/07/2015 Notes: Wound/Skin Impairment Nursing Diagnoses: Impaired tissue integrity [name], [name] ([id number]) Goals: Ulcer/skin breakdown will heal within 14 weeks Date Initiated: 01/20/2015 Goal Status: Active Interventions: Assess patient/caregiver ability to obtain necessary supplies Notes: Electronic Signature(s) Signed: 04/07/2015 4:19:08 PM By: Alejandro Mulling Entered By: Alejandro Mulling on  04/07/2015 09:49:48 [name] ([id number]) -------------------------------------------------------------------------------- Pain Assessment Details Patient Name: [name] Date of Service: 04/07/2015 9:45 AM Medical Record Number: [id number] Patient Account Number: [id number] Date of Birth/Sex: Sep 05, 1965 (49 y.o. Female) Treating RN: Phillis Haggis Primary Care Physician: Yves Dill Other Clinician: Referring Physician: Yves Dill Treating Physician/Extender: BURNS III, Regis Bill in Treatment: 11 Active Problems Location of Pain Severity and Description of Pain Patient Has Paino No Site Locations Pain Management and Medication Current Pain Management: Electronic Signature(s) Signed: 04/07/2015 4:19:08 PM By: Alejandro Mulling Entered By: Alejandro Mulling on 04/07/2015 09:44:18 [name] ([id number]) -------------------------------------------------------------------------------- Patient/Caregiver Education Details Patient Name: [name] Date of Service: 04/07/2015 9:45 AM Medical Record Number: [id number] Patient Account Number: [id number] Date of Birth/Gender: 1965/09/26 (49 y.o. Female) Treating RN: Phillis Haggis Primary Care Physician: Yves Dill Other Clinician: Referring Physician: Yves Dill Treating Physician/Extender: BURNS III, Regis Bill in Treatment: 11 Education Assessment Education Provided To: Patient and Caregiver Education Topics Provided Wound/Skin Impairment: Handouts: Other: change dressing as directed Methods: Demonstration, Explain/Verbal Responses: State content correctly Electronic Signature(s) Signed: 04/07/2015 4:19:08 PM By: Alejandro Mulling Entered By: Alejandro Mulling on 04/07/2015 10:19:04 [name] ([id number]) -------------------------------------------------------------------------------- Wound Assessment Details Patient Name: [name] Date of Service: 04/07/2015 9:45  AM Medical Record Number: [id number] Patient Account Number: [id number] Date of Birth/Sex: 12/08/1965 (  49 y.o. Female) Treating RN: Ashok CordiaPinkerton, Debi Primary Care Physician: Yves DillKhan, Neelam Other Clinician: Referring Physician: Yves DillKhan, Neelam Treating Physician/Extender: BURNS III, Regis BillWALTER Weeks in Treatment: 11 Wound Status Wound Number: 1 Primary Etiology: Infection - not elsewhere classified Wound Location: Right Lower Leg - Medial Wound Status: Open Wounding Event: Bite Date Acquired: 12/28/2014 Weeks Of Treatment: 11 Clustered Wound: No Photos Photo Uploaded By: Elliot GurneyWoody, RN, BSN, Kim on 04/07/2015 16:49:41 Wound Measurements Length: (cm) 4.1 Width: (cm) 4.5 Depth: (cm) 0.1 Area: (cm) 14.491 Volume: (cm) 1.449 % Reduction in Area: -146% % Reduction in Volume: -146% Epithelialization: None Tunneling: No Undermining: No Wound Description Full Thickness Without Exposed Foul Odor Af Classification: Support Structures Wound Margin: Flat and Intact Exudate Large Amount: Sherri DonningSHUMATE, [name] ([id number]) ter Cleansing: No Exudate Type: Serosanguineous Exudate Color: red, brown Wound Bed Granulation Amount: Medium (34-66%) Exposed Structure Granulation Quality: Red Fascia Exposed: No Necrotic Amount: Medium (34-66%) Fat Layer Exposed: No Necrotic Quality: Adherent Slough Tendon Exposed: No Muscle Exposed: No Joint Exposed: No Bone Exposed: No Limited to Skin Breakdown Periwound Skin Texture Texture Color No Abnormalities Noted: No No Abnormalities Noted: No Callus: No Atrophie Blanche: No Crepitus: No Cyanosis: No Excoriation: No Ecchymosis: No Fluctuance: No Erythema: No Friable: No Hemosiderin Staining: No Induration: No Mottled: No Localized Edema: Yes Pallor: No Rash: No Rubor: No Scarring: Yes Temperature / Pain Moisture Temperature: No Abnormality No Abnormalities Noted: No Tenderness on Palpation: Yes Dry / Scaly: No Maceration: No Moist:  Yes Wound Preparation Ulcer Cleansing: Rinsed/Irrigated with Saline Topical Anesthetic Applied: Other: lidocaine 4%, Treatment Notes Wound #1 (Right, Medial Lower Leg) 1. Cleansed with: Clean wound with Normal Saline 2. Anesthetic Topical Lidocaine 4% cream to wound bed prior to debridement 4. Dressing Applied: Aquacel Ag 5. Secondary Dressing Applied Foam ABD and Kerlix/Conform [name], [name] ([id number]) 6. Footwear/Offloading device applied Tubigrip 7. Secured with Tape Tubigrip 9. Other Orders Culture obtained per physician order Notes prescription for Doxycycline and Diflucan Electronic Signature(s) Signed: 04/07/2015 4:19:08 PM By: Alejandro MullingPinkerton, Debra Entered By: Alejandro MullingPinkerton, Debra on 04/07/2015 09:49:40 Sherri DonningSHUMATE, [name] ([id number]) -------------------------------------------------------------------------------- Vitals Details Patient Name: Sherri DonningSHUMATE, [name] Date of Service: 04/07/2015 9:45 AM Medical Record Number: [id number] Patient Account Number: [id number] Date of Birth/Sex: Apr 17, 1966 (49 y.o. Female) Treating RN: Ashok CordiaPinkerton, Debi Primary Care Physician: Yves DillKhan, Neelam Other Clinician: Referring Physician: Yves DillKhan, Neelam Treating Physician/Extender: BURNS III, Regis BillWALTER Weeks in Treatment: 11 Vital Signs Time Taken: 09:45 Temperature (F): 98.5 Height (in): 64 Pulse (bpm): 87 Weight (lbs): 215 Respiratory Rate (breaths/min): 20 Body Mass Index (BMI): 36.9 Blood Pressure (mmHg): 140/90 Reference Range: 80 - 120 mg / dl Electronic Signature(s) Signed: 04/07/2015 4:19:08 PM By: Alejandro MullingPinkerton, Debra Entered By: Alejandro MullingPinkerton, Debra on 04/07/2015 09:46:07

## 2015-04-08 NOTE — Progress Notes (Signed)
SERRA, YOUNAN ([id number]) Visit Report for 04/07/2015 Chief Complaint Document Details Patient Name: LOURETTA, TANTILLO Date of Service: 04/07/2015 9:45 AM Medical Record Patient Account Number: [id number] Number: Treating RN: Huel Coventry 1965-08-30 (49 y.o. Other Clinician: Date of Birth/Sex: Female) Treating BURNS III, Primary Care Physician: Yves Dill Physician/Extender: Zollie Beckers Referring Physician: Blain Pais in Treatment: 11 Information Obtained from: Patient Chief Complaint Right calf ulcer, status post I and D of abscess. Electronic Signature(s) Signed: 04/07/2015 1:01:02 PM By: Madelaine Bhat MD Entered By: Madelaine Bhat on 04/07/2015 11:12:42 [name] ([id number]) -------------------------------------------------------------------------------- HPI Details Patient Name: [name] Date of Service: 04/07/2015 9:45 AM Medical Record Patient Account Number: [id number] Number: Treating RN: Huel Coventry 20-Oct-1965 (49 y.o. Other Clinician: Date of Birth/Sex: Female) Treating BURNS III, Primary Care Physician: Yves Dill Physician/Extender: Zollie Beckers Referring Physician: Blain Pais in Treatment: 11 History of Present Illness HPI Description: Pleasant 49 year old with history of borderline personality disorder and CP. No history of diabetes or peripheral arterial disease. Right ABI 1.2. She developed an ulceration on her right anterior calf secondary to an insect bite. This progressed to an abscess, and she underwent I and D on 01/02/2015 by Dr. Michela Pitcher. Cultures grew oxacillin sensitive staph aureus. She completed a course of clindamycin. She lives in a group home. Performing dressing changes with silver alginate. Using Ace wrap for edema control. Punch biopsy 03/24/2015 showed no evidence for malignancy. Negative for granulomatous inflammation and vasculitis. 3 layer compression bandage applied last week, which  she says that she tolerated well. Removed by home health. She returns to clinic and reports worsening pain, swelling, and redness. No fever or chills. Increased drainage. Electronic Signature(s) Signed: 04/07/2015 1:01:02 PM By: Madelaine Bhat MD Entered By: Madelaine Bhat on 04/07/2015 11:14:35 [name] ([id number]) -------------------------------------------------------------------------------- Physical Exam Details Patient Name: [name] Date of Service: 04/07/2015 9:45 AM Medical Record Patient Account Number: [id number] Number: Treating RN: Huel Coventry May 09, 1965 (49 y.o. Other Clinician: Date of Birth/Sex: Female) Treating BURNS III, Primary Care Physician: Yves Dill Physician/Extender: Zollie Beckers Referring Physician: Yves Dill Weeks in Treatment: 11 Constitutional . Pulse regular. Respirations normal and unlabored. Afebrile. Marland Kitchen Respiratory WNL. No retractions.. Cardiovascular Pedal Pulses WNL. Integumentary (Hair, Skin) .Marland Kitchen Neurological Sensation normal to touch, pin,and vibration. Psychiatric . Oriented times 3.. . Notes Right anterior calf ulceration worse. Full-thickness. Recurrent cellulitis. No residual abscess. Minimal edema. Palpable DP. Right ABI 1.2. Electronic Signature(s) Signed: 04/07/2015 1:01:02 PM By: Madelaine Bhat MD Entered By: Madelaine Bhat on 04/07/2015 11:15:52 [name] ([id number]) -------------------------------------------------------------------------------- Physician Orders Details Patient Name: [name] Date of Service: 04/07/2015 9:45 AM Medical Record Patient Account Number: [id number] Number: Treating RN: Phillis Haggis 07-02-1965 (49 y.o. Other Clinician: Date of Birth/Sex: Female) Treating BURNS III, Primary Care Physician: Yves Dill Physician/Extender: Zollie Beckers Referring Physician: Blain Pais in Treatment: 11 Verbal / Phone Orders:  Yes Clinician: Ashok Cordia, Debi Read Back and Verified: Yes Diagnosis Coding Wound Cleansing Wound #1 Right,Medial Lower Leg o Clean wound with Normal Saline. o May Shower, gently pat wound dry prior to applying new dressing. Anesthetic Wound #1 Right,Medial Lower Leg o Topical Lidocaine 4% cream applied to wound bed prior to debridement Primary Wound Dressing Wound #1 Right,Medial Lower Leg o Aquacel Ag Secondary Dressing Wound #1 Right,Medial Lower Leg o ABD and Kerlix/Conform o Foam - do not put tape around the foam Dressing Change Frequency Wound #1 Right,Medial Lower Leg o Change dressing every other day. - Three times weekly  Follow-up Appointments Wound #1 Right,Medial Lower Leg o Return Appointment in 1 week. Edema Control Wound #1 Right,Medial Lower Leg o Tubigrip Additional Orders / Instructions Wound #1 Right,Medial Lower Leg o Increase protein intake. Tempie DonningSHUMATE, Braydee ([id number]) o Activity as tolerated Home Health Wound #1 Right,Medial Lower Leg o Continue Home Health Visits - Advanced Please follow MD orders o Home Health Nurse may visit PRN to address patientos wound care needs. o FACE TO FACE ENCOUNTER: MEDICARE and MEDICAID PATIENTS: I certify that this patient is under my care and that I had a face-to-face encounter that meets the physician face-to-face encounter requirements with this patient on this date. The encounter with the patient was in whole or in part for the following MEDICAL CONDITION: (primary reason for Home Healthcare) MEDICAL NECESSITY: I certify, that based on my findings, NURSING services are a medically necessary home health service. HOME BOUND STATUS: I certify that my clinical findings support that this patient is homebound (i.e., Due to illness or injury, pt requires aid of supportive devices such as crutches, cane, wheelchairs, walkers, the use of special transportation or the assistance of another person  to leave their place of residence. There is a normal inability to leave the home and doing so requires considerable and taxing effort. Other absences are for medical reasons / religious services and are infrequent or of short duration when for other reasons). o If current dressing causes regression in wound condition, may D/C ordered dressing product/s and apply Normal Saline Moist Dressing daily until next Wound Healing Center / Other MD appointment. Notify Wound Healing Center of regression in wound condition at [phone]. o Please direct any NON-WOUND related issues/requests for orders to patient's Primary Care Physician Medications-please add to medication list. o P.O. Antibiotics - Doxycycline, Diflucan Laboratory o Culture and Sensitivity - wound Electronic Signature(s) Signed: 04/07/2015 1:01:02 PM By: Madelaine BhatBurns, III, Walter MD Signed: 04/07/2015 4:19:08 PM By: Alejandro MullingPinkerton, Debra Entered By: Alejandro MullingPinkerton, Debra on 04/07/2015 10:18:01 Tempie DonningSHUMATE, Perrie ([id number]) -------------------------------------------------------------------------------- Prescription 04/07/2015 Patient Name: Tempie DonningSHUMATE, Zikeria Physician: Madelaine BhatBURNS III, WALTER MD Date of Birth: 1965/04/23 NPI#: [id number] Sex: F DEA#: Phone #: [phone] License #: Patient Address: [facility] [address] [place], [place] [address] [facility] [address], [address] [place], [place] [address] [phone] Allergies penicillin Physician's Orders ABD and Kerlix/Conform Signature(s): Date(s): Electronic Signature(s) Signed: 04/07/2015 1:01:02 PM By: Madelaine BhatBurns, III, Walter MD Signed: 04/07/2015 4:19:08 PM By: Alejandro MullingPinkerton, Debra Entered By: Alejandro MullingPinkerton, Debra on 04/07/2015 10:18:02 Tempie DonningSHUMATE, Kenora ([id number]) --------------------------------------------------------------------------------  Problem List Details Patient Name: Tempie DonningSHUMATE, Lillee Date of Service:  04/07/2015 9:45 AM Medical Record Patient Account Number: [id number] Number: Treating RN: Huel CoventryWoody, Kim 1965/04/23 (49 y.o. 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 S81.801S Unspecified open wound, right lower leg, sequela 01/20/2015 Yes F60.3 Borderline personality disorder 01/20/2015 Yes L03.115 Cellulitis of right lower limb 04/07/2015 Yes Inactive Problems Resolved Problems Electronic Signature(s) Signed: 04/07/2015 1:01:02 PM By: Madelaine BhatBurns, III, Walter MD Entered By: Madelaine BhatBurns, III, Walter on 04/07/2015 11:12:27 Tempie DonningSHUMATE, Angelene ([id number]) -------------------------------------------------------------------------------- Progress Note Details Patient Name: Tempie DonningSHUMATE, Mathew Date of Service: 04/07/2015 9:45 AM Medical Record Patient Account Number: [id number] Number: Treating RN: Huel CoventryWoody, Kim 1965/04/23 (49 y.o. Other Clinician: Date of Birth/Sex: Female) Treating BURNS III, Primary Care Physician: Yves DillKhan, Neelam  Physician/Extender: Zollie Beckers Referring Physician: Blain Pais in Treatment: 11 Subjective Chief Complaint Information obtained from Patient Right calf ulcer, status post I and D of abscess. History of Present Illness (HPI) Pleasant 49 year old with history of borderline personality disorder and CP. No history of diabetes or peripheral arterial disease. Right ABI 1.2. She developed an ulceration on her right anterior calf secondary to an insect bite. This progressed to an abscess, and she underwent I and D on 01/02/2015 by Dr. Michela Pitcher. Cultures grew oxacillin sensitive staph aureus. She completed a course of clindamycin. She lives in a group  home. Performing dressing changes with silver alginate. Using Ace wrap for edema control. Punch biopsy 03/24/2015 showed no evidence for malignancy. Negative for granulomatous inflammation and vasculitis. 3 layer compression bandage applied last week, which she says that she tolerated well. Removed by home health. She returns to clinic and reports worsening pain, swelling, and redness. No fever or chills. Increased drainage. Objective Constitutional Pulse regular. Respirations normal and unlabored. Afebrile. Vitals Time Taken: 9:45 AM, Height: 64 in, Weight: 215 lbs, BMI: 36.9, Temperature: 98.5 F, Pulse: 87 bpm, Respiratory Rate: 20 breaths/min, Blood Pressure: 140/90 mmHg. Respiratory WNL. No retractions.Marland Kitchen ELNA, RADOVICH ([id number]) Cardiovascular Pedal Pulses WNL. Neurological Sensation normal to touch, pin,and vibration. Psychiatric Oriented times 3.. General Notes: Right anterior calf ulceration worse. Full-thickness. Recurrent cellulitis. No residual abscess. Minimal edema. Palpable DP. Right ABI 1.2. Too tender to debridement. Swab culture obtained. Integumentary (Hair, Skin) Wound #1 status is Open. Original cause of wound was Bite. The wound is located on the Right,Medial Lower Leg. The wound measures 4.1cm length x 4.5cm width x 0.1cm depth; 14.491cm^2 area and 1.449cm^3 volume. The wound is limited to skin breakdown. There is no tunneling or undermining noted. There is a large amount of serosanguineous drainage noted. The wound margin is flat and intact. There is medium (34-66%) red granulation within the wound bed. There is a medium (34-66%) amount of necrotic tissue within the wound bed including Adherent Slough. The periwound skin appearance exhibited: Localized Edema, Scarring, Moist. The periwound skin appearance did not exhibit: Callus, Crepitus, Excoriation, Fluctuance, Friable, Induration, Rash, Dry/Scaly, Maceration, Atrophie Blanche, Cyanosis, Ecchymosis,  Hemosiderin Staining, Mottled, Pallor, Rubor, Erythema. Periwound temperature was noted as No Abnormality. The periwound has tenderness on palpation. Assessment Active Problems ICD-10 L97.212 - Non-pressure chronic ulcer of right calf with fat layer exposed S81.801S - Unspecified open wound, right lower leg, sequela F60.3 - Borderline personality disorder L03.115 - Cellulitis of right lower limb Right calf ulcer status post I and D of abscess. MICHAL, STRZELECKI ([id number]) Plan Wound Cleansing: Wound #1 Right,Medial Lower Leg: Clean wound with Normal Saline. May Shower, gently pat wound dry prior to applying new dressing. Anesthetic: Wound #1 Right,Medial Lower Leg: Topical Lidocaine 4% cream applied to wound bed prior to debridement Primary Wound Dressing: Wound #1 Right,Medial Lower Leg: Aquacel Ag Secondary Dressing: Wound #1 Right,Medial Lower Leg: ABD and Kerlix/Conform Foam - do not put tape around the foam Dressing Change Frequency: Wound #1 Right,Medial Lower Leg: Change dressing every other day. - Three times weekly Follow-up Appointments: Wound #1 Right,Medial Lower Leg: Return Appointment in 1 week. Edema Control: Wound #1 Right,Medial Lower Leg: Tubigrip Additional Orders / Instructions: Wound #1 Right,Medial Lower Leg: Increase protein intake. Activity as tolerated Home Health: Wound #1 Right,Medial Lower Leg: Continue Home Health Visits - Advanced Please follow MD orders Home Health Nurse may visit PRN to address patient s wound care needs. FACE TO FACE ENCOUNTER: MEDICARE and  MEDICAID PATIENTS: I certify that this patient is under my care and that I had a face-to-face encounter that meets the physician face-to-face encounter requirements with this patient on this date. The encounter with the patient was in whole or in part for the following MEDICAL CONDITION: (primary reason for Home Healthcare) MEDICAL NECESSITY: I certify, that based on my findings,  NURSING services are a medically necessary home health service. HOME BOUND STATUS: I certify that my clinical findings support that this patient is homebound (i.e., Due to illness or injury, pt requires aid of supportive devices such as crutches, cane, wheelchairs, walkers, the use of special transportation or the assistance of another person to leave their place of residence. There is a normal inability to leave the home and doing so requires considerable and taxing effort. Other absences are for medical reasons / religious services and are infrequent or of short duration when for other reasons). If current dressing causes regression in wound condition, may D/C ordered dressing product/s and apply Normal Saline Moist Dressing daily until next Wound Healing Center / Other MD appointment. Notify Wound Healing Center of regression in wound condition at [phone]. Please direct any NON-WOUND related issues/requests for orders to patient's Primary Care Physician Medications-please add to medication list.: P.O. Antibiotics - Doxycycline, Diflucan Osbourn, Kenniyah ([id number]) Laboratory ordered were: Culture and Sensitivity - wound Doxycycline started. Follow-up wound culture obtained today. Silver alginate. Tubigrip or Ace wrap for edema control. Once cellulitis is resolved, we'll recommend compression bandages. Awaiting plastic surgery consultation for consideration of skin graft. Electronic Signature(s) Signed: 04/07/2015 1:01:02 PM By: Madelaine Bhat MD Entered By: Madelaine Bhat on 04/07/2015 11:17:49 [name] ([id number]) -------------------------------------------------------------------------------- SuperBill Details Patient Name: [name] Date of Service: 04/07/2015 Medical Record Patient Account Number: [id number] Number: Treating RN: Huel Coventry [birth year] (49 y.o. Other Clinician: Date of Birth/Sex: Female) Treating BURNS III, Primary Care  Physician: Yves Dill Physician/Extender: Zollie Beckers Referring Physician: Yves Dill Weeks in Treatment: 11 Diagnosis Coding ICD-10 Codes Code Description L97.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 with fat layer exposed S81.801S Unspecified open wound, right lower leg, sequela F60.3 Borderline personality disorder L03.115 Cellulitis of right lower limb Facility Procedures CPT4 Code: [id number] Description: [id number] - WOUND CARE VISIT-LEV 2 EST PT Modifier: Quantity: 1 Physician Procedures CPT4 Code: [id number] Description: 99213 - WC PHYS LEVEL 3 - EST PT ICD-10 Description Diagnosis L03.115 Cellulitis of right lower limb Modifier: Quantity: 1 Electronic Signature(s) Signed: 04/07/2015 1:01:02 PM By: Madelaine Bhat MD Entered By: Madelaine Bhat on 04/07/2015 11:18:09

## 2015-04-11 LAB — WOUND CULTURE: CULTURE: NORMAL

## 2015-04-14 ENCOUNTER — Encounter: Payer: Medicare Other | Admitting: Surgery

## 2015-04-14 DIAGNOSIS — L97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 with fat layer exposed: Secondary | ICD-10-CM | POA: Diagnosis not present

## 2015-04-15 NOTE — Progress Notes (Signed)
CHENISE, MULVIHILL ([id number]) Visit Report for 04/14/2015 Arrival Information Details Patient Name: NOELIA, LENART Date of Service: 04/14/2015 3:30 PM Medical Record Number: [id number] Patient Account Number: [id number] Date of Birth/Sex: 19-Aug-1965 (49 y.o. Female) Treating RN: Clover Mealy, RN, BSN, [place]ink Primary Care Physician: Yves Dill Other Clinician: Referring Physician: Yves Dill Treating Physician/Extender: BURNS III, Regis Bill in Treatment: 12 Visit Information History Since Last Visit Any new allergies or adverse reactions: No Patient Arrived: Ambulatory Signs or symptoms of abuse/neglect since last No Arrival Time: 15:08 visito Accompanied By: caregiver Hospitalized since last visit: No Transfer Assistance: None Has Dressing in Place as Prescribed: Yes Patient Identification Verified: Yes Has Compression in Place as Prescribed: Yes Secondary Verification Process Yes Pain Present Now: No Completed: Patient Requires Transmission-Based No Precautions: Patient Has Alerts: Yes Patient Alerts: aspirin Electronic Signature(s) Signed: 04/14/2015 5:10:02 PM By: Elpidio Eric BSN, RN Entered By: Elpidio Eric on 04/14/2015 15:09:05 [name] ([id number]) -------------------------------------------------------------------------------- Lower Extremity Assessment Details Patient Name: [name] Date of Service: 04/14/2015 3:30 PM Medical Record Number: [id number] Patient Account Number: [id number] Date of Birth/Sex: July 12, 1965 (49 y.o. Female) Treating RN: Clover Mealy, RN, BSN, [place] Sink Primary Care Physician: Yves Dill Other Clinician: Referring Physician: Yves Dill Treating Physician/Extender: BURNS III, Regis Bill in Treatment: 12 Vascular Assessment Claudication: Claudication Assessment [Right:None] Pulses: Posterior Tibial Dorsalis Pedis Palpable: [Right:Yes] Extremity colors, hair growth, and conditions: Extremity Color: [Right:Normal] Hair  Growth on Extremity: [Right:Yes] Temperature of Extremity: [Right:Warm] Capillary Refill: [Right:< 3 seconds] Toe Nail Assessment Left: Right: Thick: No Discolored: No Deformed: No Improper Length and Hygiene: No Electronic Signature(s) Signed: 04/14/2015 3:13:59 PM By: Elpidio Eric BSN, RN Entered By: Elpidio Eric on 04/14/2015 15:13:59 [name] ([id number]) -------------------------------------------------------------------------------- Multi Wound Chart Details Patient Name: [name] Date of Service: 04/14/2015 3:30 PM Medical Record Number: [id number] Patient Account Number: [id number] Date of Birth/Sex: 26-May-1965 (49 y.o. Female) Treating RN: Clover Mealy, RN, BSN, Rita Primary Care Physician: Yves Dill Other Clinician: Referring Physician: Yves Dill Treating Physician/Extender: BURNS III, Regis Bill in Treatment: 12 Vital Signs Height(in): 64 Pulse(bpm): Weight(lbs): 215 Blood Pressure (mmHg): Body Mass Index(BMI): 37 Temperature(F): Respiratory Rate 18 (breaths/min): Photos: [1:No Photos] [N/A:N/A] Wound Location: [1:Right Lower Leg - Medial] [N/A:N/A] Wounding Event: [1:Bite] [N/A:N/A] Primary Etiology: [1:Infection - not elsewhere classified] [N/A:N/A] Date Acquired: [1:12/28/2014] [N/A:N/A] Weeks of Treatment: [1:12] [N/A:N/A] Wound Status: [1:Open] [N/A:N/A] Measurements L x W x D 4x4x0.1 [N/A:N/A] (cm) Area (cm) : [1:12.566] [N/A:N/A] Volume (cm) : [1:1.257] [N/A:N/A] % Reduction in Area: [1:-113.30%] [N/A:N/A] % Reduction in Volume: -113.40% [N/A:N/A] Classification: [1:Full Thickness Without Exposed Support Structures] [N/A:N/A] Exudate Amount: [1:Large] [N/A:N/A] Exudate Type: [1:Sanguinous] [N/A:N/A] Exudate Color: [1:red] [N/A:N/A] Wound Margin: [1:Flat and Intact] [N/A:N/A] Granulation Amount: [1:Large (67-100%)] [N/A:N/A] Granulation Quality: [1:Red] [N/A:N/A] Necrotic Amount: [1:None Present (0%)] [N/A:N/A] Exposed  Structures: [1:Fascia: No Fat: No Tendon: No Muscle: No Joint: No Bone: No] [N/A:N/A] Limited to Skin Breakdown Epithelialization: Small (1-33%) N/A N/A Periwound Skin Texture: Edema: Yes N/A N/A Scarring: Yes Excoriation: No Induration: No Callus: No Crepitus: No Fluctuance: No Friable: No Rash: No Periwound Skin Moist: Yes N/A N/A Moisture: Maceration: No Dry/Scaly: No Periwound Skin Color: Atrophie Blanche: No N/A N/A Cyanosis: No Ecchymosis: No Erythema: No Hemosiderin Staining: No Mottled: No Pallor: No Rubor: No Temperature: No Abnormality N/A N/A Tenderness on Yes N/A N/A Palpation: Wound Preparation: Ulcer Cleansing: N/A N/A Rinsed/Irrigated with Saline Topical Anesthetic Applied: Other: lidocaine 4% Treatment Notes Electronic Signature(s) Signed: 04/14/2015 5:10:02 PM By: Elpidio Eric BSN, RN Entered By: Elpidio Eric on  04/14/2015 15:34:18 Tempie DonningSHUMATE, Nelda ([id number]) -------------------------------------------------------------------------------- Multi-Disciplinary Care Plan Details Patient Name: Tempie DonningSHUMATE, Dorothyann Date of Service: 04/14/2015 3:30 PM Medical Record Number: [id number] Patient Account Number: [id number] Date of Birth/Sex: 11-Aug-1965 (49 y.o. Female) Treating RN: Clover MealyAfful, RN, BSN, [place] Sinkita Primary Care Physician: Yves DillKhan, Neelam Other Clinician: Referring Physician: Yves DillKhan, Neelam Treating Physician/Extender: BURNS III, Regis BillWALTER Weeks in Treatment: 12 Active Inactive Orientation to the Wound Care Program Nursing Diagnoses: Knowledge deficit related to the wound healing center program Goals: Patient/caregiver will verbalize understanding of the Wound Healing Center Program Date Initiated: 01/20/2015 Goal Status: Active Interventions: Provide education on orientation to the wound center Notes: Soft Tissue Infection Nursing Diagnoses: Potential for infection: soft tissue Goals: Patient will remain free of wound infection Date Initiated:  01/20/2015 Goal Status: Active Interventions: Assess signs and symptoms of infection every visit Treatment Activities: Culture and sensitivity : 04/14/2015 Notes: Wound/Skin Impairment Nursing Diagnoses: Impaired tissue integrity Tempie DonningSHUMATE, Shardea ([id number]) Goals: Ulcer/skin breakdown will heal within 14 weeks Date Initiated: 01/20/2015 Goal Status: Active Interventions: Assess patient/caregiver ability to obtain necessary supplies Notes: Electronic Signature(s) Signed: 04/14/2015 5:10:02 PM By: Elpidio EricAfful, Rita BSN, RN Entered By: Elpidio EricAfful, Rita on 04/14/2015 15:34:09 Tempie DonningSHUMATE, Analis ([id number]) -------------------------------------------------------------------------------- Pain Assessment Details Patient Name: Tempie DonningSHUMATE, Deriona Date of Service: 04/14/2015 3:30 PM Medical Record Number: [id number] Patient Account Number: [id number] Date of Birth/Sex: 11-Aug-1965 (49 y.o. Female) Treating RN: Clover MealyAfful, RN, BSN, [place]inkita Primary Care Physician: Yves DillKhan, Neelam Other Clinician: Referring Physician: Yves DillKhan, Neelam Treating Physician/Extender: BURNS III, Regis BillWALTER Weeks in Treatment: 12 Active Problems Location of Pain Severity and Description of Pain Patient Has Paino No Site Locations Pain Management and Medication Current Pain Management: Electronic Signature(s) Signed: 04/14/2015 5:10:02 PM By: Elpidio EricAfful, Rita BSN, RN Entered By: Elpidio EricAfful, Rita on 04/14/2015 15:10:02 Tempie DonningSHUMATE, Fanchon ([id number]) -------------------------------------------------------------------------------- Wound Assessment Details Patient Name: Tempie DonningSHUMATE, Kensey Date of Service: 04/14/2015 3:30 PM Medical Record Number: [id number] Patient Account Number: [id number] Date of Birth/Sex: 11-Aug-1965 (49 y.o. Female) Treating RN: Afful, RN, BSN, Rita Primary Care Physician: Yves DillKhan, Neelam Other Clinician: Referring Physician: Yves DillKhan, Neelam Treating Physician/Extender: BURNS III, Regis BillWALTER Weeks in Treatment: 12 Wound Status Wound  Number: 1 Primary Etiology: Infection - not elsewhere classified Wound Location: Right Lower Leg - Medial Wound Status: Open Wounding Event: Bite Date Acquired: 12/28/2014 Weeks Of Treatment: 12 Clustered Wound: No Photos Photo Uploaded By: Elpidio EricAfful, Rita on 04/14/2015 16:42:55 Wound Measurements Length: (cm) 4 Width: (cm) 4 Depth: (cm) 0.1 Area: (cm) 12.566 Volume: (cm) 1.257 % Reduction in Area: -113.3% % Reduction in Volume: -113.4% Epithelialization: Small (1-33%) Tunneling: No Undermining: No Wound Description Full Thickness Without Exposed Foul Odor Af Classification: Support Structures Wound Margin: Flat and Intact Exudate Large Amount: Exudate Type: Sanguinous Exudate Color: red ter Cleansing: No Wound Bed Granulation Amount: Large (67-100%) Exposed Structure Granulation Quality: Red Fascia Exposed: No Necrotic Amount: None Present (0%) Fat Layer Exposed: No Gatson, Jamera ([id number]) Tendon Exposed: No Muscle Exposed: No Joint Exposed: No Bone Exposed: No Limited to Skin Breakdown Periwound Skin Texture Texture Color No Abnormalities Noted: No No Abnormalities Noted: No Callus: No Atrophie Blanche: No Crepitus: No Cyanosis: No Excoriation: No Ecchymosis: No Fluctuance: No Erythema: No Friable: No Hemosiderin Staining: No Induration: No Mottled: No Localized Edema: Yes Pallor: No Rash: No Rubor: No Scarring: Yes Temperature / Pain Moisture Temperature: No Abnormality No Abnormalities Noted: No Tenderness on Palpation: Yes Dry / Scaly: No Maceration: No Moist: Yes Wound Preparation Ulcer Cleansing: Rinsed/Irrigated with Saline Topical Anesthetic Applied: Other: lidocaine 4%, Electronic Signature(s) Signed: 04/14/2015 3:13:39 PM By:  Afful, [place] Sink BSN, RN Entered By: Elpidio Eric on 04/14/2015 15:13:38 [name] ([id number]) -------------------------------------------------------------------------------- Vitals  Details Patient Name: [name] Date of Service: 04/14/2015 3:30 PM Medical Record Number: [id number] Patient Account Number: [id number] Date of Birth/Sex: 1965-10-12 (49 y.o. Female) Treating RN: Clover Mealy, RN, BSN, [place] Sink Primary Care Physician: Yves Dill Other Clinician: Referring Physician: Yves Dill Treating Physician/Extender: BURNS III, Regis Bill in Treatment: 12 Vital Signs Time Taken: 15:10 Respiratory Rate (breaths/min): 18 Height (in): 64 Reference Range: 80 - 120 mg / dl Weight (lbs): 962 Body Mass Index (BMI): 36.9 Electronic Signature(s) Signed: 04/14/2015 5:10:02 PM By: Elpidio Eric BSN, RN Entered By: Elpidio Eric on 04/14/2015 15:10:11

## 2015-04-15 NOTE — Progress Notes (Signed)
Sherri, Green ([id number]) Visit Report for 04/14/2015 Chief Complaint Document Details Patient Name: [name], [name] Date of Service: 04/14/2015 3:30 PM Medical Record Patient Account Number: [id number] Number: Treating RN: Phillis Haggis [birth year] (49 y.o. Other Clinician: Date of Birth/Sex: Female) Treating BURNS III, Primary Care Physician: Yves Dill Physician/Extender: Zollie Beckers Referring Physician: Blain Pais in Treatment: 12 Information Obtained from: Patient Chief Complaint Right calf ulcer, status post I and D of abscess. Electronic Signature(s) Signed: 04/14/2015 4:23:19 PM By: Madelaine Bhat MD Entered By: Madelaine Bhat on 04/14/2015 15:49:03 [name] ([id number]) -------------------------------------------------------------------------------- HPI Details Patient Name: [name] Date of Service: 04/14/2015 3:30 PM Medical Record Patient Account Number: [id number] Number: Treating RN: Phillis Haggis 27-Oct-1965 (49 y.o. Other Clinician: Date of Birth/Sex: Female) Treating BURNS III, Primary Care Physician: Yves Dill Physician/Extender: Zollie Beckers Referring Physician: Blain Pais in Treatment: 12 History of Present Illness HPI Description: Pleasant 49 year old with history of borderline personality disorder and CP. No history of diabetes or peripheral arterial disease. Right ABI 1.2. She developed an ulceration on her right anterior calf secondary to an insect bite. This progressed to an abscess, and she underwent I and D on 01/02/2015 by Dr. Michela Pitcher. Cultures grew oxacillin sensitive staph aureus. She completed a course of clindamycin. She lives in a group home. Performing dressing changes with silver alginate. Using Ace wrap for edema control. Punch biopsy 03/24/2015 showed no evidence for malignancy. Negative for granulomatous inflammation and vasculitis. 3 layer compression bandage applied recently,  which she says that she tolerated well. Removed by home health. Subsequently developed cellulitis. Improved with doxycycline. Culture showed no growth. She returns to clinic for follow-up and is without new complaints. Mild pain. No fever or chills. Minimal drainage. Awaiting plastic surgery consultation. Electronic Signature(s) Signed: 04/14/2015 4:23:19 PM By: Madelaine Bhat MD Entered By: Madelaine Bhat on 04/14/2015 15:49:56 [name] ([id number]) -------------------------------------------------------------------------------- Physical Exam Details Patient Name: [name] Date of Service: 04/14/2015 3:30 PM Medical Record Patient Account Number: [id number] Number: Treating RN: Phillis Haggis Aug 24, 1965 (49 y.o. Other Clinician: Date of Birth/Sex: Female) Treating BURNS III, Primary Care Physician: Yves Dill Physician/Extender: Zollie Beckers Referring Physician: Yves Dill Weeks in Treatment: 12 Constitutional . Notes Right anterior calf ulceration improved. Full-thickness. Cellulitis resolved. No residual abscess. Minimal edema. Palpable DP. Right ABI 1.2. Electronic Signature(s) Signed: 04/14/2015 4:23:19 PM By: Madelaine Bhat MD Entered By: Madelaine Bhat on 04/14/2015 15:50:44 [name] ([id number]) -------------------------------------------------------------------------------- Physician Orders Details Patient Name: [name] Date of Service: 04/14/2015 3:30 PM Medical Record Patient Account Number: [id number] Number: Treating RN: Clover Mealy, RN, BSN, Rita 01-21-1966 (49 y.o. Other Clinician: Date of Birth/Sex: Female) Treating BURNS III, Primary Care Physician: Yves Dill Physician/Extender: Zollie Beckers Referring Physician: Blain Pais in Treatment: 12 Verbal / Phone Orders: Yes Clinician: Afful, RN, BSN, Rita Read Back and Verified: Yes Diagnosis Coding Wound Cleansing Wound #1 Right,Medial  Lower Leg o Cleanse wound with mild soap and water o May shower with protection. Skin Barriers/Peri-Wound Care Wound #1 Right,Medial Lower Leg o Moisturizing lotion Primary Wound Dressing Wound #1 Right,Medial Lower Leg o Prisma Ag Secondary Dressing Wound #1 Right,Medial Lower Leg o Gauze and Kerlix/Conform Dressing Change Frequency Wound #1 Right,Medial Lower Leg o Change dressing every week Follow-up Appointments Wound #1 Right,Medial Lower Leg o Return Appointment in 1 week. Edema Control Wound #1 Right,Medial Lower Leg o 3 Layer Compression System - Right Lower Extremity Home Health Wound #1 Right,Medial Lower Leg o Continue Home Health Visits   o Home Health Nurse may visit PRN to address patientos wound care needs. OLEDA, BORSKI ([id number]) o FACE TO FACE ENCOUNTER: MEDICARE and MEDICAID PATIENTS: I certify that this patient is under my care and that I had a face-to-face encounter that meets the physician face-to-face encounter requirements with this patient on this date. The encounter with the patient was in whole or in part for the following MEDICAL CONDITION: (primary reason for Home Healthcare) MEDICAL NECESSITY: I certify, that based on my findings, NURSING services are a medically necessary home health service. HOME BOUND STATUS: I certify that my clinical findings support that this patient is homebound (i.e., Due to illness or injury, pt requires aid of supportive devices such as crutches, cane, wheelchairs, walkers, the use of special transportation or the assistance of another person to leave their place of residence. There is a normal inability to leave the home and doing so requires considerable and taxing effort. Other absences are for medical reasons / religious services and are infrequent or of short duration when for other reasons). o If current dressing causes regression in wound condition, may D/C ordered dressing product/s and  apply Normal Saline Moist Dressing daily until next Wound Healing Center / Other MD appointment. Notify Wound Healing Center of regression in wound condition at [phone]. Electronic Signature(s) Signed: 04/14/2015 4:23:19 PM By: Madelaine Bhat MD Signed: 04/14/2015 5:10:02 PM By: Elpidio Eric BSN, RN Entered By: Elpidio Eric on 04/14/2015 15:35:37 [name] ([id number]) -------------------------------------------------------------------------------- Problem List Details Patient Name: [name] Date of Service: 04/14/2015 3:30 PM Medical Record Patient Account Number: [id number] Number: Treating RN: Phillis Haggis 1965/05/19 (49 y.o. 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 S81.801S Unspecified open wound, right lower leg, sequela 01/20/2015 Yes F60.3 Borderline personality disorder 01/20/2015 Yes Inactive Problems Resolved Problems ICD-10 Code Description Active Date Resolved Date L03.115 Cellulitis of right lower limb 04/07/2015 04/07/2015 Electronic Signature(s) Signed: 04/14/2015 4:23:19 PM By: Madelaine Bhat MD Entered By: Madelaine Bhat on 04/14/2015 15:48:54 [name] ([id number]) -------------------------------------------------------------------------------- Progress Note Details Patient Name: [name] Date of Service: 04/14/2015 3:30 PM Medical Record Patient Account Number: [id number] Number: Treating RN: Phillis Haggis 06-10-1965 (49 y.o. Other Clinician: Date of Birth/Sex: Female) Treating BURNS III, Primary Care Physician: Yves Dill Physician/Extender: Zollie Beckers Referring Physician: Blain Pais in Treatment: 12 Subjective Chief Complaint Information obtained from Patient Right calf ulcer, status post I and D of abscess. History of Present Illness (HPI) Pleasant 49 year old with history of borderline personality disorder and CP. No history of diabetes or peripheral arterial disease. Right ABI 1.2. She developed an ulceration on her right anterior calf secondary to an insect bite. This progressed to an abscess, and she underwent I and D on 01/02/2015 by Dr. Michela Pitcher. Cultures grew oxacillin sensitive staph aureus. She completed a course of clindamycin. She lives in a group home. Performing dressing changes with silver alginate. Using Ace wrap for edema control. Punch biopsy 03/24/2015 showed no evidence for malignancy. Negative for granulomatous inflammation and vasculitis. 3 layer compression bandage applied recently, which she says that she tolerated well. Removed by home health. Subsequently developed cellulitis. Improved with doxycycline. Culture showed no growth. She returns to clinic for follow-up and is without new complaints. Mild pain. No fever or chills. Minimal  drainage. Awaiting plastic surgery consultation. Objective Constitutional Vitals Time Taken: 3:10 PM, Height: 64 in, Weight: 215 lbs, BMI: 36.9, Respiratory Rate: 18 breaths/min. General Notes: Right anterior calf ulceration improved. Full-thickness. Cellulitis resolved. No residual abscess. Minimal edema. Palpable DP. Right ABI 1.2. Integumentary (Hair, Skin) Schrieber, Collier ([id number]) Wound #1 status is Open. Original cause of wound was Bite. The wound is located on the Right,Medial Lower Leg. The wound measures 4cm length x 4cm width x 0.1cm depth; 12.566cm^2 area and 1.257cm^3 volume. The wound is limited to skin breakdown. There is no tunneling or undermining noted. There is a large amount of sanguinous drainage noted. The wound margin is flat and intact. There is large (67-100%) red  granulation within the wound bed. There is no necrotic tissue within the wound bed. The periwound skin appearance exhibited: Localized Edema, Scarring, Moist. The periwound skin appearance did not exhibit: Callus, Crepitus, Excoriation, Fluctuance, Friable, Induration, Rash, Dry/Scaly, Maceration, Atrophie Blanche, Cyanosis, Ecchymosis, Hemosiderin Staining, Mottled, Pallor, Rubor, Erythema. Periwound temperature was noted as No Abnormality. The periwound has tenderness on palpation. Assessment Active Problems ICD-10 L97.212 - Non-pressure chronic ulcer of right calf with fat layer exposed S81.801S - Unspecified open wound, right lower leg, sequela F60.3 - Borderline personality disorder Chronic right anterior calf ulceration, status post I and D of abscess. Plan Wound Cleansing: Wound #1 Right,Medial Lower Leg: Cleanse wound with mild soap and water May shower with protection. Skin Barriers/Peri-Wound Care: Wound #1 Right,Medial Lower Leg: Moisturizing lotion Primary Wound Dressing: Wound #1 Right,Medial Lower Leg: Prisma Ag Secondary Dressing: Wound #1 Right,Medial Lower Leg: Gauze and Kerlix/Conform Dressing Change Frequency: Wound #1 Right,Medial Lower Leg: Change dressing every week Follow-up Appointments: Wound #1 Right,Medial Lower Leg: Sherri DonningSHUMATE, Bennye ([id number]) Return Appointment in 1 week. Edema Control: Wound #1 Right,Medial Lower Leg: 3 Layer Compression System - Right Lower Extremity Home Health: Wound #1 Right,Medial Lower Leg: Continue Home Health Visits Home Health Nurse may visit PRN to address patient s wound care needs. FACE TO FACE ENCOUNTER: MEDICARE and MEDICAID PATIENTS: I certify that this patient is under my care and that I had a face-to-face encounter that meets the physician face-to-face encounter requirements with this patient on this date. The encounter with the patient was in whole or in part for the following MEDICAL CONDITION: (primary  reason for Home Healthcare) MEDICAL NECESSITY: I certify, that based on my findings, NURSING services are a medically necessary home health service. HOME BOUND STATUS: I certify that my clinical findings support that this patient is homebound (i.e., Due to illness or injury, pt requires aid of supportive devices such as crutches, cane, wheelchairs, walkers, the use of special transportation or the assistance of another person to leave their place of residence. There is a normal inability to leave the home and doing so requires considerable and taxing effort. Other absences are for medical reasons / religious services and are infrequent or of short duration when for other reasons). If current dressing causes regression in wound condition, may D/C ordered dressing product/s and apply Normal Saline Moist Dressing daily until next Wound Healing Center / Other MD appointment. Notify Wound Healing Center of regression in wound condition at [phone]. Promogran Prisma. 3 layer compression bandage. We will try to get insurance preapproval for skin substitute (Epifix). Plastic surgery consultation scheduled for January 2017. Electronic Signature(s) Signed: 04/14/2015 4:23:19 PM By: Madelaine BhatBurns, III, Aizah Gehlhausen MD Entered By: Madelaine BhatBurns, III, Maudry Zeidan on 04/14/2015 15:51:46 Sherri DonningSHUMATE, Sharifa ([id number]) -------------------------------------------------------------------------------- SuperBill Details Patient Name: Sherri DonningSHUMATE, Avynn Date of  Service: 04/14/2015 Medical Record Patient Account Number: [id number] Number: Treating RN: Phillis Haggis [birth year] (49 y.o. Other Clinician: Date of Birth/Sex: Female) Treating BURNS III, Primary Care Physician: Yves Dill Physician/Extender: Zollie Beckers Referring Physician: Yves Dill Weeks in Treatment: 12 Diagnosis Coding ICD-10 Codes Code Description L97.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 with fat layer exposed S81.801S Unspecified open wound,  right lower leg, sequela F60.3 Borderline personality disorder Facility Procedures CPT4: Description Modifier Quantity Code [id number] (Facility Use Only) [phone] - APPLY MULTLAY COMPRS LWR RT 1 LEG Physician Procedures CPT4 Code: [id number] Description: 99212 - WC PHYS LEVEL 2 - EST PT ICD-10 Description Diagnosis L97.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 with fat Modifier: layer exposed Quantity: 1 Electronic Signature(s) Signed: 04/14/2015 5:56:04 PM By: Curtis Sites Previous Signature: 04/14/2015 4:23:19 PM Version By: Madelaine Bhat MD Entered By: Curtis Sites on 04/14/2015 17:56:04

## 2015-04-21 ENCOUNTER — Encounter: Payer: Medicare Other | Attending: Surgery | Admitting: Surgery

## 2015-04-21 DIAGNOSIS — X58XXXS Exposure to other specified factors, sequela: Secondary | ICD-10-CM | POA: Insufficient documentation

## 2015-04-21 DIAGNOSIS — S81801S Unspecified open wound, right lower leg, sequela: Secondary | ICD-10-CM | POA: Insufficient documentation

## 2015-04-21 DIAGNOSIS — L97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 with fat layer exposed: Secondary | ICD-10-CM | POA: Insufficient documentation

## 2015-04-21 DIAGNOSIS — F603 Borderline personality disorder: Secondary | ICD-10-CM | POA: Diagnosis not present

## 2015-04-22 NOTE — Progress Notes (Signed)
[name], [name] ([id number]) Visit Report for 04/21/2015 Chief Complaint Document Details Patient Name: [name], [name] Date of Service: 04/21/2015 10:45 AM Medical Record Patient Account Number: [id number] Number: Treating RN: 1967-01-09 (50 y.o. Other Clinician: Date of Birth/Sex: Female) Treating [name], Primary Care Physician: [name] Physician/Extender: [name] Referring Physician: Blain Green in Treatment: 13 Information Obtained from: Patient Chief Complaint Right calf ulcer, status post I and D of abscess. Electronic Signature(s) Signed: 04/22/2015 8:51:38 AM By: Sherri Bhat MD Entered By: [name] on 04/21/2015 11:22:51 [name] ([id number]) -------------------------------------------------------------------------------- Debridement Details Patient Name: [name] Date of Service: 04/21/2015 10:45 AM Medical Record Patient Account Number: [id number] Number: Treating RN: Aug 13, 1966 (50 y.o. Other Clinician: Date of Birth/Sex: Female) Treating [name], Primary Care Physician: [name] Physician/Extender: [name] Referring Physician: Blain Green in Treatment: 13 Debridement Performed for Wound #1 Right,Medial Lower Leg Assessment: Performed By: Physician [name], Sherri Crazier., MD Debridement: Debridement Pre-procedure Yes Verification/Time Out Taken: Start Time: 11:14 Pain Control: Lidocaine 4% Topical Solution Level: Skin/Subcutaneous Tissue Total Area Debrided (L x 2.7 (cm) x 3.2 (cm) = 8.64 (cm) W): Tissue and other Viable, Non-Viable, Fat, Fibrin/Slough, Subcutaneous material debrided: Instrument: Curette Bleeding: Minimum Hemostasis Achieved: Pressure End Time: 11:15 Procedural Pain: 0 Post Procedural Pain: 0 Response to Treatment: Procedure was tolerated well Post Debridement Measurements of Total Wound Length: (cm) 2.7 Width: (cm) 3.2 Depth: (cm) 0.2 Volume: (cm)  1.357 Post Procedure Diagnosis Same as Pre-procedure Electronic Signature(s) Signed: 04/22/2015 8:51:38 AM By: Sherri Bhat MD Entered By: [name] on 04/21/2015 11:22:42 [name] ([id number]) -------------------------------------------------------------------------------- HPI Details Patient Name: [name] Date of Service: 04/21/2015 10:45 AM Medical Record Patient Account Number: [id number] Number: Treating RN: 1966/07/15 (50 y.o. Other Clinician: Date of Birth/Sex: Female) Treating [name], Primary Care Physician: [name] Physician/Extender: [name] Referring Physician: Blain Green in Treatment: 13 History of Present Illness HPI Description: Pleasant 50 year old with history of borderline personality disorder and CP. No history of diabetes or peripheral arterial disease. Right ABI 1.2. She developed an ulceration on her right anterior calf secondary to an insect bite. This progressed to an abscess, and she underwent I and D on 01/02/2015 by Dr. Michela Green. Cultures grew oxacillin sensitive staph aureus. She completed a course of clindamycin. She lives in a group home. Punch biopsy 03/24/2015 showed no evidence for malignancy. Negative for granulomatous inflammation and vasculitis. Tolerated 3 layer compression bandage last week with silver alginate. She returns to clinic for follow-up and is without new complaints. Mild pain. No fever or chills. Minimal drainage. Electronic Signature(s) Signed: 04/22/2015 8:51:38 AM By: Sherri Bhat MD Entered By: [name] on 04/21/2015 11:24:19 [name] ([id number]) -------------------------------------------------------------------------------- Physical Exam Details Patient Name: [name] Date of Service: 04/21/2015 10:45 AM Medical Record Patient Account Number: [id number] Number: Treating RN: 09-22-1966 (50 y.o. Other Clinician: Date of  Birth/Sex: Female) Treating [name], Primary Care Physician: [name] Physician/Extender: [name] Referring Physician: Yves Green Weeks in Treatment: 13 Constitutional . Pulse regular. Respirations normal and unlabored. Afebrile. . Notes Right anterior calf ulceration much improved with compression bandage. Full-thickness. Debrided to healthy bleeding granulation tissue. No evidence for infection. Minimal edema. Palpable DP. Right ABI 1.2. Electronic Signature(s) Signed: 04/22/2015 8:51:38 AM By: Sherri Bhat MD Entered By: [name] on 04/21/2015 11:25:20 [name] ([id number]) -------------------------------------------------------------------------------- Physician Orders Details Patient Name: [name] Date of Service: 04/21/2015 10:45 AM Medical Record Patient Account Number: [id number]  Number: Treating RN: Sherri SitesDorthy, [name] [birth year] (50 y.o. Other Clinician: Date of Birth/Sex: Female) Treating [name], Primary Care Physician: Sherri DillKhan, Neelam Physician/Extender: Sherri BeckersWALTER Referring Physician: Blain PaisKhan, Neelam Weeks in Treatment: 13 Verbal / Phone Orders: Yes Clinician: Curtis Green, [name] Read Back and Verified: Yes Diagnosis Coding Wound Cleansing Wound #1 Right,Medial Lower Leg o Cleanse wound with mild soap and water o May shower with protection. Skin Barriers/Peri-Wound Care Wound #1 Right,Medial Lower Leg o Moisturizing lotion Primary Wound Dressing Wound #1 Right,Medial Lower Leg o Prisma Ag - or equivalent Secondary Dressing Wound #1 Right,Medial Lower Leg o Gauze and Kerlix/Conform Dressing Change Frequency Wound #1 Right,Medial Lower Leg o Other[facility] Wound #1 Right,Medial Lower Leg o Return Appointment in 1 week. Edema Control Wound #1 Right,Medial Lower Leg o 3 Layer Compression System - Right Lower Extremity - use unna paste to anchor  wrap at the top Home Health Wound #1 Right,Medial Lower Leg o Continue Home Health Visits o Home Health Nurse may visit PRN to address patientos wound care needs. Sherri DonningSHUMATE, [name] ([id number]) o FACE TO FACE ENCOUNTER: MEDICARE and MEDICAID PATIENTS: I certify that this patient is under my care and that I had a face-to-face encounter that meets the physician face-to-face encounter requirements with this patient on this date. The encounter with the patient was in whole or in part for the following MEDICAL CONDITION: (primary reason for Home Healthcare) MEDICAL NECESSITY: I certify, that based on my findings, NURSING services are a medically necessary home health service. HOME BOUND STATUS: I certify that my clinical findings support that this patient is homebound (i.e., Due to illness or injury, pt requires aid of supportive devices such as crutches, cane, wheelchairs, walkers, the use of special transportation or the assistance of another person to leave their place of residence. There is a normal inability to leave the home and doing so requires considerable and taxing effort. Other absences are for medical reasons / religious services and are infrequent or of short duration when for other reasons). o If current dressing causes regression in wound condition, may D/C ordered dressing product/s and apply Normal Saline Moist Dressing daily until next Wound Healing Center / Other MD appointment. Notify Wound Healing Center of regression in wound condition at [phone]. Electronic Signature(s) Signed: 04/21/2015 5:20:05 PM By: [name], [name] Signed: 04/22/2015 8:51:38 AM By: Sherri BhatBurns, Green, Walter MD Entered By: [name], [name] on 04/21/2015 11:17:26 Sherri DonningSHUMATE, [name] ([id number]) -------------------------------------------------------------------------------- Problem List Details Patient Name: Sherri DonningSHUMATE, [name] Date of Service: 04/21/2015 10:45 AM Medical Record Patient Account Number:  [id number] Number: Treating RN: [birth year] (50 y.o. 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 S81.801S Unspecified open wound, right lower leg, sequela 01/20/2015 Yes F60.3 Borderline personality disorder 01/20/2015 Yes Inactive Problems Resolved Problems ICD-10 Code Description Active Date Resolved Date L03.115 Cellulitis of right lower limb 04/07/2015 04/07/2015 Electronic Signature(s) Signed: 04/22/2015 8:51:38 AM By: Sherri BhatBurns, Green, Walter MD Entered By: Sherri BhatBurns, Green, [name] on 04/21/2015 11:21:56 Sherri DonningSHUMATE, Ameirah ([id number]) -------------------------------------------------------------------------------- Progress Note Details Patient Name: Sherri DonningSHUMATE, [name] Date of Service: 04/21/2015 10:45 AM Medical Record Patient Account Number: [id number] Number: Treating RN: [birth year] (50 y.o. Other Clinician: Date of Birth/Sex: Female) Treating [name], Primary Care Physician: Sherri DillKhan, Neelam Physician/Extender: Sherri BeckersWALTER Referring  Physician: Yves Green Weeks in Treatment: 13 Subjective Chief Complaint Information obtained from Patient Right calf ulcer, status post I and D of abscess. History of Present Illness (HPI) Pleasant 50 year old with history of borderline personality disorder and CP. No history of diabetes or peripheral arterial disease. Right ABI 1.2. She developed an ulceration on her right anterior calf secondary to an insect bite. This progressed to an abscess, and she underwent I and D on 01/02/2015 by Dr. Michela Green. Cultures grew oxacillin sensitive staph aureus. She completed a course of clindamycin. She lives in a group home. Punch biopsy 03/24/2015 showed  no evidence for malignancy. Negative for granulomatous inflammation and vasculitis. Tolerated 3 layer compression bandage last week with silver alginate. She returns to clinic for follow-up and is without new complaints. Mild pain. No fever or chills. Minimal drainage. Objective Constitutional Pulse regular. Respirations normal and unlabored. Afebrile. Vitals Time Taken: 10:56 AM, Height: 64 in, Weight: 215 lbs, BMI: 36.9, Temperature: 98.3 F, Pulse: 50 bpm, Respiratory Rate: 18 breaths/min, Blood Pressure: 111/69 mmHg. General Notes: Right anterior calf ulceration much improved with compression bandage. Full-thickness. Debrided to healthy bleeding granulation tissue. No evidence for infection. Minimal edema. Palpable DP. Right ABI 1.2. [name], [name] ([id number]) Integumentary (Hair, Skin) Wound #1 status is Open. Original cause of wound was Bite. The wound is located on the Right,Medial Lower Leg. The wound measures 2.7cm length x 3.2cm width x 0.1cm depth; 6.786cm^2 area and 0.679cm^3 volume. The wound is limited to skin breakdown. There is no tunneling or undermining noted. There is a medium amount of serosanguineous drainage noted. The wound margin is flat and intact. There is large (67-100%) red granulation within the wound bed. There is no necrotic tissue within the wound bed. The periwound skin appearance exhibited: Localized Edema, Scarring, Moist. The periwound skin appearance did not exhibit: Callus, Crepitus, Excoriation, Fluctuance, Friable, Induration, Rash, Dry/Scaly, Maceration, Atrophie Blanche, Cyanosis, Ecchymosis, Hemosiderin Staining, Mottled, Pallor, Rubor, Erythema. Periwound temperature was noted as No Abnormality. The periwound has tenderness on palpation. Assessment Active Problems ICD-10 L97.212 - Non-pressure chronic ulcer of right calf with fat layer exposed S81.801S - Unspecified open wound, right lower leg, sequela F60.3 - Borderline personality  disorder Right anterior calf ulceration, status post I and D of abscess. Localized edema. Procedures Wound #1 Wound #1 is an Infection - not elsewhere classified located on the Right,Medial Lower Leg . There was a Skin/Subcutaneous Tissue Debridement ([id number]) debridement with total area of 8.64 sq cm performed by [name], Sherri Crazier., MD. with the following instrument(s): Curette to remove Viable and Non-Viable tissue/material including Fat, Fibrin/Slough, and Subcutaneous after achieving pain control using Lidocaine 4% Topical Solution. A time out was conducted prior to the start of the procedure. A Minimum amount of bleeding was controlled with Pressure. The procedure was tolerated well with a pain level of 0 throughout and a pain level of 0 following the procedure. Post Debridement Measurements: 2.7cm length x 3.2cm width x 0.2cm depth; 1.357cm^3 volume. Post procedure Diagnosis Wound #1: Same as Pre-Procedure [name], [name] ([id number]) Plan Wound Cleansing: Wound #1 Right,Medial Lower Leg: Cleanse wound with mild soap and water May shower with protection. Skin Barriers/Peri-Wound Care: Wound #1 Right,Medial Lower Leg: Moisturizing lotion Primary Wound Dressing: Wound #1 Right,Medial Lower Leg: Prisma Ag - or equivalent Secondary Dressing: Wound #1 Right,Medial Lower Leg: Gauze and Kerlix/Conform Dressing Change Frequency: Wound #1 Right,Medial Lower Leg: Other: - twice weekly - [facility] at [facility] and once by [facility] Follow-up Appointments: Wound #1 Right,Medial Lower  Leg: Return Appointment in 1 week. Edema Control: Wound #1 Right,Medial Lower Leg: 3 Layer Compression System - Right Lower Extremity - use unna paste to anchor wrap at the top Home Health: Wound #1 Right,Medial Lower Leg: Continue Home Health Visits Home Health Nurse may visit PRN to address patient s wound care needs. FACE TO FACE ENCOUNTER: MEDICARE and MEDICAID PATIENTS: I certify that this patient is  under my care and that I had a face-to-face encounter that meets the physician face-to-face encounter requirements with this patient on this date. The encounter with the patient was in whole or in part for the following MEDICAL CONDITION: (primary reason for Home Healthcare) MEDICAL NECESSITY: I certify, that based on my findings, NURSING services are a medically necessary home health service. HOME BOUND STATUS: I certify that my clinical findings support that this patient is homebound (i.e., Due to illness or injury, pt requires aid of supportive devices such as crutches, cane, wheelchairs, walkers, the use of special transportation or the assistance of another person to leave their place of residence. There is a normal inability to leave the home and doing so requires considerable and taxing effort. Other absences are for medical reasons / religious services and are infrequent or of short duration when for other reasons). If current dressing causes regression in wound condition, may D/C ordered dressing product/s and apply Normal Saline Moist Dressing daily until next Wound Healing Center / Other MD appointment. Notify Wound Healing Center of regression in wound condition at [phone]. Continue with Promogran Prisma and Profore light compression bandage weekly or twice weekly (if home health can perform once weekly). [name], [name] ([id number]) Electronic Signature(s) Signed: 04/22/2015 8:51:38 AM By: Sherri Bhat MD Entered By: [name] on 04/21/2015 11:26:36 [name] ([id number]) -------------------------------------------------------------------------------- SuperBill Details Patient Name: [name] Date of Service: 04/21/2015 Medical Record Patient Account Number: [id number] Number: Treating RN: 06-Sep-1965 (49 y.o. Other Clinician: Date of Birth/Sex: Female) Treating [name], Primary Care Physician: [name] Physician/Extender:  [name] Referring Physician: Yves Green Weeks in Treatment: 13 Diagnosis Coding ICD-10 Codes Code Description L97.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 with fat layer exposed S81.801S Unspecified open wound, right lower leg, sequela F60.3 Borderline personality disorder Facility Procedures CPT4 Code: [id number] Description: 11042 - DEB SUBQ TISSUE 20 SQ CM/< ICD-10 Description Diagnosis L97.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 with fat Modifier: layer exposed Quantity: 1 Physician Procedures CPT4 Code: [id number] Description: 11042 - WC PHYS SUBQ TISS 20 SQ CM ICD-10 Description Diagnosis L97.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 with fat Modifier: layer exposed Quantity: 1 Electronic Signature(s) Signed: 04/22/2015 8:51:38 AM By: Sherri Bhat MD Entered By: [name] on 04/21/2015 11:26:46

## 2015-04-22 NOTE — Progress Notes (Signed)
[name] ([id number]) Visit Report for 04/21/2015 Arrival Information Details Patient Name: [name], [name] Date of Service: 04/21/2015 10:45 AM Medical Record Number: [id number] Patient Account Number: [id number] Date of Birth/Sex: 03-30-1966 (50 y.o. Female) Treating RN: Curtis Sites Primary Care Physician: Yves Dill Other Clinician: Referring Physician: Yves Dill Treating Physician/Extender: BURNS III, Regis Bill in Treatment: 13 Visit Information History Since Last Visit Added or deleted any medications: No Patient Arrived: Ambulatory Any new allergies or adverse reactions: No Arrival Time: 10:47 Had a fall or experienced change in No Accompanied By: staff activities of daily living that may affect Transfer Assistance: None risk of falls: Patient Identification Verified: Yes Signs or symptoms of abuse/neglect since last No Secondary Verification Process Yes visito Completed: Hospitalized since last visit: No Patient Requires Transmission-Based No Pain Present Now: No Precautions: Patient Has Alerts: Yes Patient Alerts: aspirin Electronic Signature(s) Signed: 04/21/2015 5:20:05 PM By: Curtis Sites Entered By: Curtis Sites on 04/21/2015 10:48:00 [name] ([id number]) -------------------------------------------------------------------------------- Encounter Discharge Information Details Patient Name: [name] Date of Service: 04/21/2015 10:45 AM Medical Record Number: [id number] Patient Account Number: [id number] Date of Birth/Sex: Dec 22, 1965 (50 y.o. Female) Treating RN: Curtis Sites Primary Care Physician: Yves Dill Other Clinician: Referring Physician: Yves Dill Treating Physician/Extender: BURNS III, Regis Bill in Treatment: 13 Encounter Discharge Information Items Discharge Pain Level: 0 Discharge Condition: Stable Ambulatory Status: Ambulatory Discharge Destination: Home Transportation: Private Auto Accompanied By:  staff Schedule Follow-up Appointment: Yes Medication Reconciliation completed and provided to Patient/Care No Maritsa Hunsucker: Provided on Clinical Summary of Care: 04/21/2015 Form Type Recipient Paper Patient [place] Electronic Signature(s) Signed: 04/21/2015 11:32:07 AM By: Gwenlyn Perking Entered By: Gwenlyn Perking on 04/21/2015 11:32:07 [name] ([id number]) -------------------------------------------------------------------------------- Lower Extremity Assessment Details Patient Name: [name] Date of Service: 04/21/2015 10:45 AM Medical Record Number: [id number] Patient Account Number: [id number] Date of Birth/Sex: Aug 28, 1965 (50 y.o. Female) Treating RN: Curtis Sites Primary Care Physician: Yves Dill Other Clinician: Referring Physician: Yves Dill Treating Physician/Extender: BURNS III, Regis Bill in Treatment: 13 Vascular Assessment Pulses: Posterior Tibial Dorsalis Pedis Palpable: [Right:Yes] Extremity colors, hair growth, and conditions: Extremity Color: [Right:Normal] Hair Growth on Extremity: [Right:Yes] Temperature of Extremity: [Right:Warm] Capillary Refill: [Right:< 3 seconds] Toe Nail Assessment Left: Right: Thick: No Discolored: No Deformed: No Improper Length and Hygiene: No Electronic Signature(s) Signed: 04/21/2015 5:20:05 PM By: Curtis Sites Entered By: Curtis Sites on 04/21/2015 10:53:57 [name] ([id number]) -------------------------------------------------------------------------------- Multi Wound Chart Details Patient Name: [name] Date of Service: 04/21/2015 10:45 AM Medical Record Number: [id number] Patient Account Number: [id number] Date of Birth/Sex: 1965-08-27 (50 y.o. Female) Treating RN: Curtis Sites Primary Care Physician: Yves Dill Other Clinician: Referring Physician: Yves Dill Treating Physician/Extender: BURNS III, Regis Bill in Treatment: 13 Vital Signs Height(in): 64 Pulse(bpm):  50 Weight(lbs): 215 Blood Pressure 111/69 (mmHg): Body Mass Index(BMI): 37 Temperature(F): 98.3 Respiratory Rate 18 (breaths/min): Photos: [1:No Photos] [N/A:N/A] Wound Location: [1:Right Lower Leg - Medial] [N/A:N/A] Wounding Event: [1:Bite] [N/A:N/A] Primary Etiology: [1:Infection - not elsewhere classified] [N/A:N/A] Date Acquired: [1:12/28/2014] [N/A:N/A] Weeks of Treatment: [1:13] [N/A:N/A] Wound Status: [1:Open] [N/A:N/A] Measurements L x W x D 2.7x3.2x0.1 [N/A:N/A] (cm) Area (cm) : [1:6.786] [N/A:N/A] Volume (cm) : [1:0.679] [N/A:N/A] % Reduction in Area: [1:-15.20%] [N/A:N/A] % Reduction in Volume: -15.30% [N/A:N/A] Classification: [1:Full Thickness Without Exposed Support Structures] [N/A:N/A] Exudate Amount: [1:Medium] [N/A:N/A] Exudate Type: [1:Serosanguineous] [N/A:N/A] Exudate Color: [1:red, brown] [N/A:N/A] Wound Margin: [1:Flat and Intact] [N/A:N/A] Granulation Amount: [1:Large (67-100%)] [N/A:N/A] Granulation Quality: [1:Red] [N/A:N/A] Necrotic Amount: [1:None Present (0%)] [N/A:N/A] Exposed Structures: [1:Fascia:  No Fat: No Tendon: No Muscle: No Joint: No Bone: No] [N/A:N/A] Limited to Skin Breakdown Epithelialization: Small (1-33%) N/A N/A Periwound Skin Texture: Edema: Yes N/A N/A Scarring: Yes Excoriation: No Induration: No Callus: No Crepitus: No Fluctuance: No Friable: No Rash: No Periwound Skin Moist: Yes N/A N/A Moisture: Maceration: No Dry/Scaly: No Periwound Skin Color: Atrophie Blanche: No N/A N/A Cyanosis: No Ecchymosis: No Erythema: No Hemosiderin Staining: No Mottled: No Pallor: No Rubor: No Temperature: No Abnormality N/A N/A Tenderness on Yes N/A N/A Palpation: Wound Preparation: Ulcer Cleansing: Other: N/A N/A soap and water Topical Anesthetic Applied: Other: lidocaine 4% Treatment Notes Electronic Signature(s) Signed: 04/21/2015 5:20:05 PM By: Curtis Sitesorthy, Joanna Entered By: Curtis Sitesorthy, Joanna on 04/21/2015  11:02:12 Sherri DonningSHUMATE, Aayla ([id number]) -------------------------------------------------------------------------------- Multi-Disciplinary Care Plan Details Patient Name: [name] Date of Service: 04/21/2015 10:45 AM Medical Record Number: [id number] Patient Account Number: [id number] Date of Birth/Sex: 01/14/1966 (50 y.o. Female) Treating RN: Curtis Sitesorthy, Joanna Primary Care Physician: Yves DillKhan, Neelam Other Clinician: Referring Physician: Yves DillKhan, Neelam Treating Physician/Extender: BURNS III, Regis BillWALTER Weeks in Treatment: 13 Active Inactive Orientation to the Wound Care Program Nursing Diagnoses: Knowledge deficit related to the wound healing center program Goals: Patient/caregiver will verbalize understanding of the Wound Healing Center Program Date Initiated: 01/20/2015 Goal Status: Active Interventions: Provide education on orientation to the wound center Notes: Soft Tissue Infection Nursing Diagnoses: Potential for infection: soft tissue Goals: Patient will remain free of wound infection Date Initiated: 01/20/2015 Goal Status: Active Interventions: Assess signs and symptoms of infection every visit Treatment Activities: Culture and sensitivity : 04/21/2015 Notes: Wound/Skin Impairment Nursing Diagnoses: Impaired tissue integrity Sherri DonningSHUMATE, Sherice ([id number]) Goals: Ulcer/skin breakdown will heal within 14 weeks Date Initiated: 01/20/2015 Goal Status: Active Interventions: Assess patient/caregiver ability to obtain necessary supplies Notes: Electronic Signature(s) Signed: 04/21/2015 5:20:05 PM By: Curtis Sitesorthy, Joanna Entered By: Curtis Sitesorthy, Joanna on 04/21/2015 11:02:02 Sherri DonningSHUMATE, Nichoel ([id number]) -------------------------------------------------------------------------------- Patient/Caregiver Education Details Patient Name: Sherri DonningSHUMATE, Keni Date of Service: 04/21/2015 10:45 AM Medical Record Number: [id number] Patient Account Number: [id number] Date of Birth/Gender: 01/14/1966  (50 y.o. Female) Treating RN: Curtis Sitesorthy, Joanna Primary Care Physician: Yves DillKhan, Neelam Other Clinician: Referring Physician: Yves DillKhan, Neelam Treating Physician/Extender: BURNS III, Regis BillWALTER Weeks in Treatment: 13 Education Assessment Education Provided To: Patient and Caregiver Education Topics Provided Venous: Handouts: Controlling Swelling with Multilayered Compression Wraps Methods: Demonstration, Explain/Verbal Responses: State content correctly Electronic Signature(s) Signed: 04/21/2015 5:20:05 PM By: Curtis Sitesorthy, Joanna Entered By: Curtis Sitesorthy, Joanna on 04/21/2015 11:33:01 Sherri DonningSHUMATE, Yazleemar ([id number]) -------------------------------------------------------------------------------- Wound Assessment Details Patient Name: Sherri DonningSHUMATE, Lasondra Date of Service: 04/21/2015 10:45 AM Medical Record Number: [id number] Patient Account Number: [id number] Date of Birth/Sex: 01/14/1966 (50 y.o. Female) Treating RN: Curtis Sitesorthy, Joanna Primary Care Physician: Yves DillKhan, Neelam Other Clinician: Referring Physician: Yves DillKhan, Neelam Treating Physician/Extender: BURNS III, Regis BillWALTER Weeks in Treatment: 13 Wound Status Wound Number: 1 Primary Etiology: Infection - not elsewhere classified Wound Location: Right Lower Leg - Medial Wound Status: Open Wounding Event: Bite Date Acquired: 12/28/2014 Weeks Of Treatment: 13 Clustered Wound: No Photos Photo Uploaded By: Curtis Sitesorthy, Joanna on 04/21/2015 13:55:24 Wound Measurements Length: (cm) 2.7 Width: (cm) 3.2 Depth: (cm) 0.1 Area: (cm) 6.786 Volume: (cm) 0.679 % Reduction in Area: -15.2% % Reduction in Volume: -15.3% Epithelialization: Small (1-33%) Tunneling: No Undermining: No Wound Description Full Thickness Without Exposed Classification: Support Structures Wound Margin: Flat and Intact Exudate Medium Amount: Exudate Type: Serosanguineous Exudate Color: red, brown Foul Odor After Cleansing: No Wound Bed Granulation Amount: Large (67-100%) Exposed  Structure Granulation Quality: Red Fascia Exposed: No Necrotic Amount: None Present (0%) Fat  Layer Exposed: No Gaul, Jadee ([id number]) Tendon Exposed: No Muscle Exposed: No Joint Exposed: No Bone Exposed: No Limited to Skin Breakdown Periwound Skin Texture Texture Color No Abnormalities Noted: No No Abnormalities Noted: No Callus: No Atrophie Blanche: No Crepitus: No Cyanosis: No Excoriation: No Ecchymosis: No Fluctuance: No Erythema: No Friable: No Hemosiderin Staining: No Induration: No Mottled: No Localized Edema: Yes Pallor: No Rash: No Rubor: No Scarring: Yes Temperature / Pain Moisture Temperature: No Abnormality No Abnormalities Noted: No Tenderness on Palpation: Yes Dry / Scaly: No Maceration: No Moist: Yes Wound Preparation Ulcer Cleansing: Other: soap and water, Topical Anesthetic Applied: Other: lidocaine 4%, Treatment Notes Wound #1 (Right, Medial Lower Leg) 1. Cleansed with: Cleanse wound with antibacterial soap and water 2. Anesthetic Topical Lidocaine 4% cream to wound bed prior to debridement 3. Peri-wound Care: Barrier cream 4. Dressing Applied: Prisma Ag 5. Secondary Dressing Applied Dry Gauze 7. Secured with 3 Layer Compression System - Right Lower Extremity Electronic Signature(s) Signed: 04/21/2015 5:20:05 PM By: Curtis Sites Entered By: Curtis Sites on 04/21/2015 10:56:12 [name] ([id number]) PHILOMENA, BUTTERMORE ([id number]) -------------------------------------------------------------------------------- Vitals Details Patient Name: [name] Date of Service: 04/21/2015 10:45 AM Medical Record Number: [id number] Patient Account Number: [id number] Date of Birth/Sex: [birth year] (50 y.o. Female) Treating RN: Curtis Sites Primary Care Physician: Yves Dill Other Clinician: Referring Physician: Yves Dill Treating Physician/Extender: BURNS III, Regis Bill in Treatment: 13 Vital Signs Time Taken:  10:56 Temperature (F): 98.3 Height (in): 64 Pulse (bpm): 50 Weight (lbs): 215 Respiratory Rate (breaths/min): 18 Body Mass Index (BMI): 36.9 Blood Pressure (mmHg): 111/69 Reference Range: 80 - 120 mg / dl Electronic Signature(s) Signed: 04/21/2015 5:20:05 PM By: Curtis Sites Entered By: Curtis Sites on 04/21/2015 10:56:35

## 2015-04-24 ENCOUNTER — Emergency Department
Admission: EM | Admit: 2015-04-24 | Discharge: 2015-04-26 | Disposition: A | Discharge status: Other Acute Inpatient Hospital | Payer: Medicare Other | Attending: Emergency Medicine | Admitting: Emergency Medicine

## 2015-04-24 ENCOUNTER — Encounter: Payer: Self-pay | Admitting: Emergency Medicine

## 2015-04-24 DIAGNOSIS — F329 Major depressive disorder, single episode, unspecified: Secondary | ICD-10-CM | POA: Diagnosis not present

## 2015-04-24 DIAGNOSIS — F419 Anxiety disorder, unspecified: Secondary | ICD-10-CM | POA: Diagnosis not present

## 2015-04-24 DIAGNOSIS — Z880 Allergy status to penicillin: Secondary | ICD-10-CM | POA: Diagnosis not present

## 2015-04-24 DIAGNOSIS — Z791 Long term (current) use of non-steroidal anti-inflammatories (NSAID): Secondary | ICD-10-CM | POA: Diagnosis not present

## 2015-04-24 DIAGNOSIS — Z3202 Encounter for pregnancy test, result negative: Secondary | ICD-10-CM | POA: Diagnosis not present

## 2015-04-24 DIAGNOSIS — Z79899 Other long term (current) drug therapy: Secondary | ICD-10-CM | POA: Insufficient documentation

## 2015-04-24 DIAGNOSIS — F603 Borderline personality disorder: Secondary | ICD-10-CM | POA: Diagnosis not present

## 2015-04-24 DIAGNOSIS — Z7951 Long term (current) use of inhaled steroids: Secondary | ICD-10-CM | POA: Insufficient documentation

## 2015-04-24 DIAGNOSIS — F32A Depression, unspecified: Secondary | ICD-10-CM

## 2015-04-24 DIAGNOSIS — F331 Major depressive disorder, recurrent, moderate: Secondary | ICD-10-CM | POA: Diagnosis not present

## 2015-04-24 DIAGNOSIS — R45851 Suicidal ideations: Secondary | ICD-10-CM | POA: Diagnosis present

## 2015-04-24 LAB — COMPREHENSIVE METABOLIC PANEL
ALK PHOS: 45 U/L (ref 38–126)
ALT: 14 U/L (ref 14–54)
AST: 17 U/L (ref 15–41)
Albumin: 4 g/dL (ref 3.5–5.0)
Anion gap: 3 — ABNORMAL LOW (ref 5–15)
BUN: 16 mg/dL (ref 6–20)
CALCIUM: 9.2 mg/dL (ref 8.9–10.3)
CO2: 29 mmol/L (ref 22–32)
CREATININE: 0.86 mg/dL (ref 0.44–1.00)
Chloride: 109 mmol/L (ref 101–111)
Glucose, Bld: 100 mg/dL — ABNORMAL HIGH (ref 65–99)
Potassium: 3.5 mmol/L (ref 3.5–5.1)
SODIUM: 141 mmol/L (ref 135–145)
Total Bilirubin: 0.4 mg/dL (ref 0.3–1.2)
Total Protein: 7.4 g/dL (ref 6.5–8.1)

## 2015-04-24 LAB — CBC WITH DIFFERENTIAL/PLATELET
Basophils Absolute: 0.1 10*3/uL (ref 0–0.1)
Basophils Relative: 1 %
EOS ABS: 0.2 10*3/uL (ref 0–0.7)
EOS PCT: 3 %
HCT: 37.8 % (ref 35.0–47.0)
Hemoglobin: 13 g/dL (ref 12.0–16.0)
LYMPHS ABS: 2.1 10*3/uL (ref 1.0–3.6)
Lymphocytes Relative: 31 %
MCH: 31.2 pg (ref 26.0–34.0)
MCHC: 34.3 g/dL (ref 32.0–36.0)
MCV: 90.9 fL (ref 80.0–100.0)
MONO ABS: 0.8 10*3/uL (ref 0.2–0.9)
Monocytes Relative: 12 %
Neutro Abs: 3.7 10*3/uL (ref 1.4–6.5)
Neutrophils Relative %: 53 %
PLATELETS: 278 10*3/uL (ref 150–440)
RBC: 4.16 MIL/uL (ref 3.80–5.20)
RDW: 13.7 % (ref 11.5–14.5)
WBC: 6.9 10*3/uL (ref 3.6–11.0)

## 2015-04-24 LAB — URINE DRUG SCREEN, QUALITATIVE (ARMC ONLY)
Amphetamines, Ur Screen: NOT DETECTED
BARBITURATES, UR SCREEN: NOT DETECTED
BENZODIAZEPINE, UR SCRN: NOT DETECTED
COCAINE METABOLITE, UR ~~LOC~~: NOT DETECTED
Cannabinoid 50 Ng, Ur ~~LOC~~: NOT DETECTED
MDMA (ECSTASY) UR SCREEN: NOT DETECTED
Methadone Scn, Ur: NOT DETECTED
OPIATE, UR SCREEN: NOT DETECTED
PHENCYCLIDINE (PCP) UR S: NOT DETECTED
TRICYCLIC, UR SCREEN: NOT DETECTED

## 2015-04-24 LAB — ETHANOL: Alcohol, Ethyl (B): <5 mg/dL (ref <5)

## 2015-04-24 LAB — POCT PREGNANCY, URINE: Preg Test, Ur: NEGATIVE
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
## 2015-04-24 NOTE — ED Notes (Signed)
Patient states "I am suicidal, I will take pills." States these feelings began today. States has not taken anything but regular meds at this point.

## 2015-04-24 NOTE — ED Provider Notes (Signed)
[facility] Emergency Department Provider Note   ____________________________________________  Time seen: 1950  I have reviewed the triage vital signs and the nursing notes.   HISTORY  Chief Complaint  Suicidal   History limited by: Not Limited   HPI [name] is a 50 y.o. female who presents to the emergency department today because of concerns for suicidal ideation. The patient states his symptoms started yesterday. She states it is been constant. She states it is severe. She states she has developed a plan. Her plan is to buy pills and overdose intentionally. The patient states that the stressor and what makes it worse is that she is not happy with her living situation. She does not feel safe at her current residence and she feels like there are plenty of breaking his and gunfire around where she lives. She denies any medical complaints.     Past Medical History  Diagnosis Date  . Asthma   . Anxiety   . GERD (gastroesophageal reflux disease)   . Mild cognitive impairment   . Cerebral palsy (HCC)   . Borderline personality disorder   . Depression   . Wound abscess     Patient Active Problem List   Diagnosis Date Noted  . Anxiety 12/31/2014  . Cellulitis and abscess of leg 12/31/2014  . 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 GERD (gastroesophageal reflux disease) 09/24/2014  . COPD (chronic obstructive pulmonary disease) (HCC) 09/24/2014  . Borderline personality disorder 09/24/2014  . Major depressive disorder, recurrent episode, moderate (HCC) 09/24/2014  . Cerebral palsy (HCC) 08/31/2014    Past Surgical History  Procedure Laterality Date  . Cholecystectomy    . Tonsillectomy    . Incision and drainage abscess Right 01/02/2015    Procedure: INCISION AND DRAINAGE ABSCESS;  Surgeon: Tiney Rouge III, MD;  Location: [facility] ORS;  Service: General;  Laterality: Right;    Current Outpatient Rx  Name  Route  Sig  Dispense  Refill  .  acetaminophen (TYLENOL) 500 MG tablet   Oral   Take 500 mg by mouth every 4 (four) hours as needed for mild pain, moderate pain, fever or headache.         . ARIPiprazole (ABILIFY) 10 MG tablet   Oral   Take 1 tablet (10 mg total) by mouth daily.   30 tablet   0   . azithromycin (ZITHROMAX Z-PAK) 250 MG tablet      Take 2 tablets (500 mg) on  Day 1,  followed by 1 tablet (250 mg) once daily on Days 2 through 5.   6 each   0   . diphenoxylate-atropine (LOMOTIL) 2.5-0.025 MG per tablet   Oral   Take 1 tablet by mouth every 6 (six) hours as needed for diarrhea or loose stools.         Marland Kitchen escitalopram (LEXAPRO) 20 MG tablet   Oral   Take 1 tablet (20 mg total) by mouth at bedtime.   30 tablet   0   . fenofibrate 54 MG tablet   Oral   Take 54 mg by mouth daily.         . Fluticasone-Salmeterol (ADVAIR) 100-50 MCG/DOSE AEPB   Inhalation   Inhale 1 puff into the lungs every 12 (twelve) hours.          Marland Kitchen guaiFENesin-codeine 100-10 MG/5ML syrup   Oral   Take 10 mLs by mouth every 4 (four) hours as needed for cough.   180 mL  0   . ibuprofen (ADVIL,MOTRIN) 800 MG tablet   Oral   Take 1 tablet (800 mg total) by mouth every 8 (eight) hours as needed.   30 tablet   0   . ketoconazole (NIZORAL) 2 % cream   Topical   Apply 1 application topically 2 (two) times daily as needed for irritation. To breast and urinal areas         . meloxicam (MOBIC) 15 MG tablet   Oral   Take 15 mg by mouth daily.         . miconazole (ZEASORB-AF) 2 % powder   Topical   Apply 1 application topically 2 (two) times daily.         . pantoprazole (PROTONIX) 40 MG tablet   Oral   Take 40 mg by mouth daily.         . traMADol (ULTRAM) 50 MG tablet   Oral   Take 50 mg by mouth 2 (two) times daily as needed for moderate pain or severe pain.           Allergies Penicillins  Family History  Problem Relation Age of Onset  . Diabetes Mother   . Heart attack Father      Social History Social History  Substance Use Topics  . Smoking status: Never Smoker   . Smokeless tobacco: Never Used  . Alcohol Use: No    Review of Systems  Constitutional: Negative for fever. Cardiovascular: Negative for chest pain. Respiratory: Negative for shortness of breath. Gastrointestinal: Negative for abdominal pain, vomiting and diarrhea. Neurological: Negative for headaches, focal weakness or numbness.   10-point ROS otherwise negative.  ____________________________________________   PHYSICAL EXAM:  VITAL SIGNS: ED Triage Vitals  Enc Vitals Group     BP 04/24/15 1858 135/92 mmHg     Pulse Rate 04/24/15 1858 82     Resp 04/24/15 1858 20     Temp 04/24/15 1858 98.3 F (36.8 C)     Temp Source 04/24/15 1858 Oral     SpO2 04/24/15 1858 97 %     Weight 04/24/15 1858 207 lb (93.895 kg)     Height 04/24/15 1858 5\' 3"  (1.6 m)   Constitutional: Alert and oriented. Appears somewhat anxious Eyes: Conjunctivae are normal. PERRL. Normal extraocular movements. ENT   Head: Normocephalic and atraumatic.   Nose: No congestion/rhinnorhea.   Mouth/Throat: Mucous membranes are moist.   Neck: No stridor. Hematological/Lymphatic/Immunilogical: No cervical lymphadenopathy. Cardiovascular: Normal rate, regular rhythm.  No murmurs, rubs, or gallops. Respiratory: Normal respiratory effort without tachypnea nor retractions. Breath sounds are clear and equal bilaterally. No wheezes/rales/rhonchi. Gastrointestinal: Soft and nontender. No distention.  Genitourinary: Deferred Musculoskeletal: Normal range of motion in all extremities. No joint effusions.  No lower extremity tenderness nor edema. Neurologic:  Normal speech and language. No gross focal neurologic deficits are appreciated.  Skin:  Skin is warm, dry and intact. No rash noted. Psychiatric: Appears somewhat anxious. Patient does endorse suicidal  ideation.  ____________________________________________    LABS (pertinent positives/negatives)  Labs Reviewed  COMPREHENSIVE METABOLIC PANEL - Abnormal; Notable for the following:    Glucose, Bld 100 (*)    Anion gap 3 (*)    All other components within normal limits  CBC WITH DIFFERENTIAL/PLATELET  URINE DRUG SCREEN, QUALITATIVE ([facility] ONLY)  ETHANOL  POCT PREGNANCY, URINE     ____________________________________________   EKG  None  ____________________________________________    RADIOLOGY  None   ____________________________________________   PROCEDURES  Procedure(s) performed:  None  Critical Care performed: No  ____________________________________________   INITIAL IMPRESSION / ASSESSMENT AND PLAN / ED COURSE  Pertinent labs & imaging results that were available during my care of the patient were reviewed by me and considered in my medical decision making (see chart for details).  Patient presents to the emergency department today because of concerns for depression and suicidal ideation with plan. On exam patient seems somewhat anxious. She does have a plan to overdose on medication. Given this will place patient under IVC and have psychiatry evaluate.  ____________________________________________   FINAL CLINICAL IMPRESSION(S) / ED DIAGNOSES  Final diagnoses:  Depressed     Phineas Semen, MD 04/24/15 2041

## 2015-04-24 NOTE — BHH Counselor (Signed)
Writer consulted with Psych MD Dr.Bell and pt meets criteria for inpatient admission. Writer has informed EDP Dr.Goodman and begin seeking bed placement.

## 2015-04-25 DIAGNOSIS — F329 Major depressive disorder, single episode, unspecified: Secondary | ICD-10-CM | POA: Diagnosis not present

## 2015-04-25 DIAGNOSIS — F331 Major depressive disorder, recurrent, moderate: Secondary | ICD-10-CM | POA: Diagnosis not present

## 2015-04-25 DIAGNOSIS — F603 Borderline personality disorder: Secondary | ICD-10-CM | POA: Diagnosis not present

## 2015-04-25 MED ORDER — PANTOPRAZOLE SODIUM 40 MG PO TBEC
40.0000 mg | DELAYED_RELEASE_TABLET | Freq: Every day | ORAL | Status: DC
  Administered 2015-04-25 – 2015-04-26 (×2): 40 mg via ORAL
  Filled 2015-04-25 (×2): qty 1

## 2015-04-25 MED ORDER — ESCITALOPRAM OXALATE 10 MG PO TABS
20.0000 mg | ORAL_TABLET | Freq: Every day | ORAL | Status: DC
  Administered 2015-04-25: 20 mg via ORAL
  Filled 2015-04-25: qty 2

## 2015-04-25 MED ORDER — ARIPIPRAZOLE 10 MG PO TABS
10.0000 mg | ORAL_TABLET | Freq: Every day | ORAL | Status: DC
  Administered 2015-04-26: 10 mg via ORAL
  Filled 2015-04-25: qty 1

## 2015-04-25 NOTE — ED Notes (Signed)
Breakfast provided  Pt observed with no unusual behavior  Appropriate to stimulation  No verbalized needs or concerns at this time  NAD assessed  Continue to monitor 

## 2015-04-25 NOTE — BHH Counselor (Signed)
[facility] is unable to accept pt due to no bed availability.

## 2015-04-25 NOTE — ED Notes (Signed)
Spoke with staff at group home and they stated that the wound on her rt leg was an infected bite from a bug and advanced home care and they change it every Friday , Dr Jenne CampusMcqueen notified,

## 2015-04-25 NOTE — BH Assessment (Signed)
Followed up with referrals: [facility] [phone])-Was unable to reach anyone in their admission department  [facility] ([phone]. [phone]), No Beds   [facility]-([phone]), Faxed for referral  [facility] ([phone]), declined due cognitive limitations. "Patient unable to program."  [facility]-[phone]) - Was unable to reach anyone in their admission department  [facility] ([phone]), No beds  [facility]- Was unable to reach anyone in their admission department  [facility]- Was unable to reach anyone in their admission department  [facility]- Was unable to reach anyone in their admission department  [name](Jakita [phone])- No Beds

## 2015-04-25 NOTE — ED Notes (Signed)
BEHAVIORAL HEALTH ROUNDING  Patient sleeping: No.  Patient alert and oriented: yes  Behavior appropriate: Yes. ; If no, describe:  Nutrition and fluids offered: Yes  Toileting and hygiene offered: Yes  Sitter present: not applicable, Q 15 min safety rounds and observation via security camera. Law enforcement present: Yes ODS  

## 2015-04-25 NOTE — ED Notes (Addendum)
Pt just notified me that she is "feeling like she wants to be dead " so I told her to please sit out in dayroom for closer observation.,Dr Jenne CampusMcqueen notified

## 2015-04-25 NOTE — ED Notes (Signed)
Pt states that she was in a group home and felt suicidal and told staff who called police , she gives no reason for her si , she does say that if she is dc that [facility] and [facility] whole bottle of pills , she offers no other c/o , does have a wrap on her leg it is reported that it is a bug bite. She is in no distress

## 2015-04-25 NOTE — ED Notes (Signed)
Pt slightly demanding wanting snacks, wanting staff to wash her hair, also on rt lower leg is a drsg she says applied by her DR I will call group home and investigate what this is for?

## 2015-04-25 NOTE — ED Notes (Addendum)
Pt. Noted in room. No complaints or concerns voiced. No distress or abnormal behavior noted. Will continue to monitor with security cameras. Q 15 minute rounds continue. 

## 2015-04-25 NOTE — ED Notes (Addendum)
Pt. Noted in room. No complaints or concerns voiced. No distress or abnormal behavior noted. Will continue to monitor with security cameras. Q 15 minute rounds continue.pt does contract for safety

## 2015-04-25 NOTE — ED Notes (Signed)
Dr Mc queen talking with pt 

## 2015-04-25 NOTE — ED Notes (Signed)
Pt. Noted  Awake to be in the day room. No complaints or concerns voiced. No distress or abnormal behavior noted. Will continue to monitor with security cameras. Q 15 minute rounds continue.

## 2015-04-25 NOTE — ED Notes (Signed)
Pt transferred from main ed,in no acute distress at this time

## 2015-04-25 NOTE — ED Notes (Signed)

## 2015-04-25 NOTE — ED Notes (Signed)
Pt. Noted in room watching the tv; verbalized wanting a new group home placement; states that the area of curent group home placement is too dangerous;. No complaints or concerns voiced. No abnormal behavior noted. Will continue to monitor with security cameras. Q 15 minute rounds continue.

## 2015-04-25 NOTE — ED Provider Notes (Signed)
-----------------------------------------   5:21 AM on 04/25/2015 -----------------------------------------   Blood pressure 135/92, pulse 82, temperature 98.3 F (36.8 C), temperature source Oral, resp. rate 20, height 5\' 3"  (1.6 m), weight 93.895 kg, SpO2 97 %.  The patient had no acute events since last update.  Calm and cooperative at this time.  Disposition is pending per Psychiatry/Behavioral Medicine team recommendations.     Loleta Roseory Sheilyn Boehlke, MD 04/25/15 [phone]

## 2015-04-25 NOTE — ED Notes (Signed)
ED BHU PLACEMENT JUSTIFICATION Is the patient under IVC or is there intent for IVC: Yes.   Is the patient medically cleared: Yes.   Is there vacancy in the ED BHU: Yes.   Is the population mix appropriate for patient: Yes.   Is the patient awaiting placement in inpatient or outpatient setting: Yes.   Has the patient had a psychiatric consult:  Consult pending Survey of unit performed for contraband, proper placement and condition of furniture, tampering with fixtures in bathroom, shower, and each patient room: Yes.  ; Findings:  APPEARANCE/BEHAVIOR Calm and cooperative NEURO ASSESSMENT Orientation: oriented x3  Denies pain Hallucinations: No.None noted (Hallucinations) Speech: Normal Gait: normal RESPIRATORY ASSESSMENT Even  Unlabored respirations  CARDIOVASCULAR ASSESSMENT Pulses equal   regular rate  Skin warm and dry   GASTROINTESTINAL ASSESSMENT no GI complaint EXTREMITIES Full ROM  PLAN OF CARE Provide calm/safe environment. Vital signs assessed twice daily. ED BHU Assessment once each 12-hour shift. Collaborate with intake RN daily or as condition indicates. Assure the ED provider has rounded once each shift. Provide and encourage hygiene. Provide redirection as needed. Assess for escalating behavior; address immediately and inform ED provider.  Assess family dynamic and appropriateness for visitation as needed: Yes.  ; If necessary, describe findings:  Educate the patient/family about BHU procedures/visitation: Yes.  ; If necessary, describe findings:   
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
## 2015-04-25 NOTE — ED Notes (Signed)
Report received from Ruth B., RN.; Pt. Alert and oriented in no distress; verbalizes having SI; denies having HI, AVH and pain.  Pt. Instructed to come to me with problems or concerns.Will continue to monitor for safety via security cameras and Q 15 minute checks. 

## 2015-04-25 NOTE — ED Notes (Signed)
Pt to transfer to BHU - Ruthie RN  Pt with reports of SI- awaiting psych consult

## 2015-04-25 NOTE — ED Notes (Signed)
ED BHU PLACEMENT JUSTIFICATION Is the patient under IVC or is there intent for IVC: Yes.   Is the patient medically cleared: Yes.   Is there vacancy in the ED BHU: No. Is the population mix appropriate for patient: Yes.   Is the patient awaiting placement in inpatient or outpatient setting: No. Has the patient had a psychiatric consult: No. Survey of unit performed for contraband, proper placement and condition of furniture, tampering with fixtures in bathroom, shower, and each patient room: Yes.  ; Findings:  APPEARANCE/BEHAVIOR Pt asleep NEURO ASSESSMENT Orientation: Pt asleep Hallucinations: Pt asleep Speech: Pt asleep Gait: Pt asleep RESPIRATORY ASSESSMENT Normal expansion.  Clear to auscultation.  No rales, rhonchi, or wheezing. CARDIOVASCULAR ASSESSMENT regular rate and rhythm, S1, S2 normal, no murmur, click, rub or gallop GASTROINTESTINAL ASSESSMENT soft, nontender, BS WNL, no r/g EXTREMITIES Pt asleep PLAN OF CARE Provide calm/safe environment. Vital signs assessed twice daily. ED BHU Assessment once each 12-hour shift. Collaborate with intake RN daily or as condition indicates. Assure the ED provider has rounded once each shift. Provide and encourage hygiene. Provide redirection as needed. Assess for escalating behavior; address immediately and inform ED provider.  Assess family dynamic and appropriateness for visitation as needed: No.; If necessary, describe findings: family unavailable Educate the patient/family about BHU procedures/visitation: No.; If necessary, describe findings: family unavailable

## 2015-04-25 NOTE — ED Notes (Signed)
Patient observed lying in hallway bed with eyes closed  Even, unlabored respirations observed   NAD pt appears to be sleeping  I will continue to monitor along with every 15 minute visual observations and ongoing security camera monitoring

## 2015-04-25 NOTE — ED Notes (Signed)
Snack and beverage given. 

## 2015-04-25 NOTE — ED Notes (Signed)
BEHAVIORAL HEALTH ROUNDING Patient sleeping: No. Patient alert and oriented: yes Behavior appropriate: Yes.  ; If no, describe:  Nutrition and fluids offered: Yes  Toileting and hygiene offered: Yes  Sitter present: no Law enforcement present: Yes  

## 2015-04-25 NOTE — ED Notes (Addendum)
ENVIRONMENTAL ASSESSMENT  Potentially harmful objects out of patient reach: Yes.  Personal belongings secured: Yes.  Patient dressed in hospital provided attire only: Yes.  Plastic bags out of patient reach: Yes.  Patient care equipment (cords, cables, call bells, lines, and drains) shortened, removed, or accounted for: Yes.  Equipment and supplies removed from bottom of stretcher: Yes.  Potentially toxic materials out of patient reach: Yes.  Sharps container removed or out of patient reach: Yes.   BEHAVIORAL HEALTH ROUNDING  Patient sleeping: No.  Patient alert and oriented: yes  Behavior appropriate: Yes. ; If no, describe:  Nutrition and fluids offered: Yes  Toileting and hygiene offered: Yes  Sitter present: not applicable, Q 15 min safety rounds and observation via security camera. Law enforcement present: Yes ODS  ED BHU PLACEMENT JUSTIFICATION  Is the patient under IVC or is there intent for IVC: Yes.  Is the patient medically cleared: Yes.  Is there vacancy in the ED BHU: Yes.  Is the population mix appropriate for patient: Yes.  Is the patient awaiting placement in inpatient or outpatient setting: Yes.  Has the patient had a psychiatric consult: Yes Survey of unit performed for contraband, proper placement and condition of furniture, tampering with fixtures in bathroom, shower, and each patient room: Yes. ; Findings: All clear  APPEARANCE/BEHAVIOR  calm, cooperative and adequate rapport can be established  NEURO ASSESSMENT  Orientation: time, place and person  Hallucinations: No.None noted (Hallucinations)  Speech: Normal  Gait: normal  RESPIRATORY ASSESSMENT  WNL  CARDIOVASCULAR ASSESSMENT  WNL  GASTROINTESTINAL ASSESSMENT  WNL  EXTREMITIES  WNL  Except pt has right lower leg wound that is being treated by Home Health RN. Pt has dressing in place of gauze pads and coban wrap. Pt is due to have dressing changed Q Friday.  PLAN OF CARE  Provide calm/safe  environment. Vital signs assessed twice daily. ED BHU Assessment once each 12-hour shift. Collaborate with intake RN daily or as condition indicates. Assure the ED provider has rounded once each shift. Provide and encourage hygiene. Provide redirection as needed. Assess for escalating behavior; address immediately and inform ED provider.  Assess family dynamic and appropriateness for visitation as needed: Yes. ; If necessary, describe findings:  Educate the patient/family about BHU procedures/visitation: Yes. ; If necessary, describe findings: Pt is calm and cooperative at this time. Pt understanding and accepting of unit procedures/rules. Will continue to monitor with Q 15 min safety rounds and observation via security camera.

## 2015-04-25 NOTE — BHH Counselor (Signed)
[facility]

## 2015-04-25 NOTE — ED Notes (Addendum)
Pt. Noted in her room watching the tv;;. No complaints or concerns voiced. No distress or abnormal behavior noted. Will continue to monitor with security cameras. Q 15 minute rounds continue.

## 2015-04-25 NOTE — ED Notes (Addendum)
Pt. Alert and oriented, warm and dry, in no distress. Pt. Denies SI, HI, and AVH. Pt. Encouraged to let nursing staff know of any concerns or needs. 

## 2015-04-25 NOTE — ED Notes (Signed)
Pt. Noted in room. No complaints or concerns voiced. No distress or abnormal behavior noted. Will continue to monitor with security cameras. Q 15 minute rounds continue. 

## 2015-04-26 ENCOUNTER — Encounter (HOSPITAL_BASED_OUTPATIENT_CLINIC_OR_DEPARTMENT_OTHER): Payer: Medicare Other

## 2015-04-26 DIAGNOSIS — F603 Borderline personality disorder: Secondary | ICD-10-CM | POA: Diagnosis not present

## 2015-04-26 DIAGNOSIS — F329 Major depressive disorder, single episode, unspecified: Secondary | ICD-10-CM | POA: Diagnosis not present

## 2015-04-26 DIAGNOSIS — F331 Major depressive disorder, recurrent, moderate: Secondary | ICD-10-CM

## 2015-04-26 MED ORDER — HYDROXYZINE HCL 25 MG PO TABS
50.0000 mg | ORAL_TABLET | Freq: Once | ORAL | Status: AC
  Administered 2015-04-26: 50 mg via ORAL

## 2015-04-26 MED ORDER — HYDROXYZINE HCL 25 MG PO TABS
ORAL_TABLET | ORAL | Status: AC
  Administered 2015-04-26: 50 mg via ORAL
  Filled 2015-04-26: qty 2

## 2015-04-26 NOTE — ED Notes (Signed)
Patient without s/s of distress, patient is safe, will continue to monitor.

## 2015-04-26 NOTE — ED Notes (Signed)
Pt. Noted in room resting quietly;. No complaints or concerns voiced. No distress or abnormal behavior noted. Will continue to monitor with security cameras. Q 15 minute rounds continue. 

## 2015-04-26 NOTE — BHH Counselor (Signed)
Pt is to be d/c back to Group Home per Dr.Faheem.

## 2015-04-26 NOTE — ED Notes (Signed)
BEHAVIORAL HEALTH ROUNDING Patient sleeping: Yes.   Patient alert and oriented: not applicable SLEEPING Behavior appropriate: Yes.  ; If no, describe: SLEEPING Nutrition and fluids offered: No SLEEPING Toileting and hygiene offered: NoSLEEPING Sitter present: not applicable, Q 15 min safety rounds and observation via security camera. Law enforcement present: Yes ODS 

## 2015-04-26 NOTE — ED Notes (Signed)
Patient with discharge instructions, patient voices understanding of discharge, all belongings given back to patient. Patient's ride from group home transporting her back.

## 2015-04-26 NOTE — ED Notes (Signed)
Nurse called group home and Patient talked to Burna MortimerWanda and she was reassured that she would be safe, Patient voices understanding of having to be discharged now. Patient is pleasant, No s/s of distress.

## 2015-04-26 NOTE — ED Provider Notes (Signed)
-----------------------------------------   7:02 AM on 04/26/2015 -----------------------------------------   Blood pressure 130/74, pulse 68, temperature 98 F (36.7 C), temperature source Oral, resp. rate 19, height 5\' 3"  (1.6 m), weight 207 lb (93.895 kg), SpO2 97 %.  The patient had no acute events since last update.  Calm and cooperative at this time.  Disposition is pending per Psychiatry/Behavioral Medicine team recommendations.     Irean HongJade J Nikholas Geffre, MD 04/26/15 [phone]

## 2015-04-26 NOTE — ED Notes (Signed)
Patient is alert and oriented, states that she wants to go anywhere but back to her group home, patient states she still having SI, Patient is cooperative, denies pain, Patient with 15 min checks, camera monitoring.

## 2015-04-26 NOTE — BHH Counselor (Signed)
Writer contacted by Pt RN Toniann Fail(Wendy) and informed that Pt is not wanting to return to Group Home. Writer informed RN that Pt could not be held without medical reason to do so. Writer also suggested RN consult with social work and EDP.   Writer informed ED Sec. Rivka Barbara(Glenda) of Pt's refusal to return to group home and attempted (unsuccessfully) to contact group home to confirm Pt's ability to return. Writer spoke with Engineer, materialsecurity Officer F[place]ette who was familiar with group home. Officer was able to get in touch with group home who stated that standing transportation order was in place between group home and [employer] for Pt transportation back to home.
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
## 2015-04-26 NOTE — ED Notes (Signed)
Patient with discharge orders and when nurse talked to her she states ' I cannot go back there, I will go to jail before I go there, nurse talked with Dr.and TTS person, Patient is own guardian and she can make arrangements per self to go to another group home, nurse talked with Patient regarding this and she states , " I have tried that and I cannot go back there" awaiting to hear back from her group home and possibly have one of staff talk with patient.

## 2015-04-26 NOTE — ED Notes (Signed)
Pt states she can not sleep due to being concerned she will be sent home tomorrow. Pt does not wish to return home. Will ask MD for med order to help pt sleep.

## 2015-04-26 NOTE — ED Notes (Signed)
Pt. Noted in room. No complaints or concerns voiced. No distress or abnormal behavior noted. Will continue to monitor with security cameras. Q 15 minute rounds continue. 

## 2015-04-26 NOTE — ED Notes (Signed)
Pt. Noted in room; awakened earlier stating, "I cannot sleep." ER-MD notified and mediation was ordered and given." No complaints or concerns voiced. No distress or abnormal behavior noted. Will continue to monitor with security cameras. Q 15 minute rounds continue.
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
## 2015-04-28 ENCOUNTER — Encounter: Payer: Medicare Other | Admitting: Surgery

## 2015-04-28 DIAGNOSIS — L97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 with fat layer exposed: Secondary | ICD-10-CM | POA: Diagnosis not present

## 2015-04-29 ENCOUNTER — Emergency Department: Payer: Medicare Other

## 2015-04-29 ENCOUNTER — Encounter: Payer: Self-pay | Admitting: Emergency Medicine

## 2015-04-29 ENCOUNTER — Other Ambulatory Visit: Payer: Self-pay

## 2015-04-29 ENCOUNTER — Emergency Department
Admission: EM | Admit: 2015-04-29 | Discharge: 2015-04-29 | Disposition: A | Discharge status: Home / Self Care | Payer: Medicare Other | Attending: Emergency Medicine | Admitting: Emergency Medicine

## 2015-04-29 DIAGNOSIS — Y9389 Activity, other specified: Secondary | ICD-10-CM | POA: Diagnosis not present

## 2015-04-29 DIAGNOSIS — F70 Mild intellectual disabilities: Secondary | ICD-10-CM | POA: Diagnosis present

## 2015-04-29 DIAGNOSIS — Y998 Other external cause status: Secondary | ICD-10-CM | POA: Diagnosis not present

## 2015-04-29 DIAGNOSIS — R45851 Suicidal ideations: Secondary | ICD-10-CM | POA: Insufficient documentation

## 2015-04-29 DIAGNOSIS — R079 Chest pain, unspecified: Secondary | ICD-10-CM | POA: Diagnosis present

## 2015-04-29 DIAGNOSIS — Z79899 Other long term (current) drug therapy: Secondary | ICD-10-CM | POA: Diagnosis not present

## 2015-04-29 DIAGNOSIS — Y9289 Other specified places as the place of occurrence of the external cause: Secondary | ICD-10-CM | POA: Insufficient documentation

## 2015-04-29 DIAGNOSIS — Z792 Long term (current) use of antibiotics: Secondary | ICD-10-CM | POA: Diagnosis not present

## 2015-04-29 DIAGNOSIS — Z7951 Long term (current) use of inhaled steroids: Secondary | ICD-10-CM | POA: Diagnosis not present

## 2015-04-29 DIAGNOSIS — Z880 Allergy status to penicillin: Secondary | ICD-10-CM | POA: Insufficient documentation

## 2015-04-29 DIAGNOSIS — T148 Other injury of unspecified body region: Secondary | ICD-10-CM | POA: Insufficient documentation

## 2015-04-29 DIAGNOSIS — F603 Borderline personality disorder: Secondary | ICD-10-CM | POA: Diagnosis not present

## 2015-04-29 DIAGNOSIS — X58XXXA Exposure to other specified factors, initial encounter: Secondary | ICD-10-CM | POA: Insufficient documentation

## 2015-04-29 DIAGNOSIS — G809 Cerebral palsy, unspecified: Secondary | ICD-10-CM | POA: Diagnosis present

## 2015-04-29 DIAGNOSIS — Z791 Long term (current) use of non-steroidal anti-inflammatories (NSAID): Secondary | ICD-10-CM | POA: Insufficient documentation

## 2015-04-29 HISTORY — DX: Schizoaffective disorder, unspecified: F25.9

## 2015-04-29 LAB — BASIC METABOLIC PANEL
Anion gap: 7 (ref 5–15)
BUN: 26 mg/dL — ABNORMAL HIGH (ref 6–20)
CHLORIDE: 105 mmol/L (ref 101–111)
CO2: 28 mmol/L (ref 22–32)
CREATININE: 0.68 mg/dL (ref 0.44–1.00)
Calcium: 8.8 mg/dL — ABNORMAL LOW (ref 8.9–10.3)
Glucose, Bld: 79 mg/dL (ref 65–99)
POTASSIUM: 3.7 mmol/L (ref 3.5–5.1)
SODIUM: 140 mmol/L (ref 135–145)

## 2015-04-29 LAB — CBC
HCT: 37.3 % (ref 35.0–47.0)
Hemoglobin: 12.8 g/dL (ref 12.0–16.0)
MCH: 31.1 pg (ref 26.0–34.0)
MCHC: 34.2 g/dL (ref 32.0–36.0)
MCV: 90.9 fL (ref 80.0–100.0)
PLATELETS: 249 10*3/uL (ref 150–440)
RBC: 4.1 MIL/uL (ref 3.80–5.20)
RDW: 13.8 % (ref 11.5–14.5)
WBC: 6.5 10*3/uL (ref 3.6–11.0)

## 2015-04-29 LAB — ACETAMINOPHEN LEVEL

## 2015-04-29 LAB — SALICYLATE LEVEL

## 2015-04-29 LAB — TROPONIN I: Troponin I: <0.03 ng/mL (ref <0.031)

## 2015-04-29 LAB — ETHANOL: ALCOHOL ETHYL (B): 8 mg/dL — AB (ref <5)

## 2015-04-29 NOTE — ED Notes (Signed)
BEHAVIORAL HEALTH ROUNDING Patient sleeping: No. Patient alert and oriented: yes Behavior appropriate: Yes.  ; If no, describe:  Nutrition and fluids offered: yes Toileting and hygiene offered: Yes  Sitter present: q15 minute observations and security  monitoring Law enforcement present: Yes  ODS  

## 2015-04-29 NOTE — Progress Notes (Signed)
ATARAH, CADOGAN ([id number]) Visit Report for 04/28/2015 Arrival Information Details Patient Name: [name], [name] Date of Service: 04/28/2015 3:00 PM Medical Record Patient Account Number: [id number] Number: Treating RN: Curtis Sites Date of Birth/Sex: [birth year] (50 y.o. Female) Other Clinician: Primary Care MCLEAN-SCOCOZZA, Treating BURNS III, Physician: French Ana Physician/Extender: Zollie Beckers Referring Physician: Blain Pais in Treatment: 14 Visit Information History Since Last Visit Added or deleted any medications: No Patient Arrived: Ambulatory Any new allergies or adverse reactions: No Arrival Time: 14:59 Had a fall or experienced change in No Accompanied By: staff activities of daily living that may affect Transfer Assistance: None risk of falls: Patient Identification Verified: Yes Signs or symptoms of abuse/neglect since last No Secondary Verification Process Yes visito Completed: Hospitalized since last visit: No Patient Requires Transmission-Based No Pain Present Now: No Precautions: Patient Has Alerts: Yes Patient Alerts: aspirin Electronic Signature(s) Signed: 04/28/2015 5:21:59 PM By: Curtis Sites Entered By: Curtis Sites on 04/28/2015 14:59:16 [name] ([id number]) -------------------------------------------------------------------------------- Encounter Discharge Information Details Patient Name: [name] Date of Service: 04/28/2015 3:00 PM Medical Record Patient Account Number: [id number] Number: Treating RN: Curtis Sites Date of Birth/Sex: Jan 17, 1966 (49 y.o. Female) Other Clinician: Primary Care MCLEAN-SCOCOZZA, Treating BURNS III, Physician: French Ana Physician/Extender: Zollie Beckers Referring Physician: Blain Pais in Treatment: 14 Encounter Discharge Information Items Discharge Pain Level: 0 Discharge Condition: Stable Ambulatory Status: Ambulatory Discharge Destination: Home Transportation:  Private Auto Accompanied By: staff Schedule Follow-up Appointment: Yes Medication Reconciliation completed and provided to Patient/Care No Vaibhav Fogleman: Provided on Clinical Summary of Care: 04/28/2015 Form Type Recipient Paper Patient [place] Electronic Signature(s) Signed: 04/28/2015 4:03:46 PM By: Curtis Sites Previous Signature: 04/28/2015 3:39:37 PM Version By: Gwenlyn Perking Entered By: Curtis Sites on 04/28/2015 16:03:46 [name] ([id number]) -------------------------------------------------------------------------------- Lower Extremity Assessment Details Patient Name: [name] Date of Service: 04/28/2015 3:00 PM Medical Record Patient Account Number: [id number] Number: Treating RN: Curtis Sites Date of Birth/Sex: Nov 13, 1965 (50 y.o. Female) Other Clinician: Primary Care MCLEAN-SCOCOZZA, Treating BURNS III, Physician: French Ana Physician/Extender: Zollie Beckers Referring Physician: Blain Pais in Treatment: 14 Vascular Assessment Pulses: Posterior Tibial Dorsalis Pedis Palpable: [Left:Yes] Extremity colors, hair growth, and conditions: Extremity Color: [Left:Normal] Hair Growth on Extremity: [Left:Yes] Temperature of Extremity: [Left:Warm] Capillary Refill: [Left:< 3 seconds] Toe Nail Assessment Left: Right: Thick: No Discolored: No Deformed: No Improper Length and Hygiene: No Electronic Signature(s) Signed: 04/28/2015 5:21:59 PM By: Curtis Sites Entered By: Curtis Sites on 04/28/2015 15:06:18 [name] ([id number]) -------------------------------------------------------------------------------- Multi Wound Chart Details Patient Name: [name] Date of Service: 04/28/2015 3:00 PM Medical Record Patient Account Number: [id number] Number: Treating RN: Curtis Sites Date of Birth/Sex: Mar 04, 1966 (50 y.o. Female) Other Clinician: Primary Care MCLEAN-SCOCOZZA, Treating BURNS III, Physician:  French Ana Physician/ExtenderZollie Beckers Referring Physician: Blain Pais in Treatment: 14 Vital Signs Height(in): 64 Pulse(bpm): 85 Weight(lbs): 215 Blood Pressure 125/76 (mmHg): Body Mass Index(BMI): 37 Temperature(F): 98.2 Respiratory Rate 18 (breaths/min): Photos: [1:No Photos] [N/A:N/A] Wound Location: [1:Right Lower Leg - Medial] [N/A:N/A] Wounding Event: [1:Bite] [N/A:N/A] Primary Etiology: [1:Infection - not elsewhere classified] [N/A:N/A] Date Acquired: [1:12/28/2014] [N/A:N/A] Weeks of Treatment: [1:14] [N/A:N/A] Wound Status: [1:Open] [N/A:N/A] Measurements L x W x D 2.5x2.5x0.1 [N/A:N/A] (cm) Area (cm) : [1:4.909] [N/A:N/A] Volume (cm) : [1:0.491] [N/A:N/A] % Reduction in Area: [1:16.70%] [N/A:N/A] % Reduction in Volume: 16.60% [N/A:N/A] Classification: [1:Full Thickness Without Exposed Support Structures] [N/A:N/A] Exudate Amount: [1:Medium] [N/A:N/A] Exudate Type: [1:Serosanguineous] [N/A:N/A] Exudate Color: [1:red, brown] [N/A:N/A] Wound Margin: [1:Flat and Intact] [N/A:N/A] Granulation Amount: [1:Large (67-100%)] [N/A:N/A] Granulation Quality: [1:Red, Hyper-granulation] [  N/A:N/A] Necrotic Amount: [1:None Present (0%)] [N/A:N/A] Exposed Structures: [1:Fascia: No Fat: No Tendon: No Muscle: No] [N/A:N/A] Joint: No Bone: No Limited to Skin Breakdown Epithelialization: Small (1-33%) N/A N/A Periwound Skin Texture: Edema: Yes N/A N/A Scarring: Yes Excoriation: No Induration: No Callus: No Crepitus: No Fluctuance: No Friable: No Rash: No Periwound Skin Moist: Yes N/A N/A Moisture: Maceration: No Dry/Scaly: No Periwound Skin Color: Atrophie Blanche: No N/A N/A Cyanosis: No Ecchymosis: No Erythema: No Hemosiderin Staining: No Mottled: No Pallor: No Rubor: No Temperature: No Abnormality N/A N/A Tenderness on Yes N/A N/A Palpation: Wound Preparation: Ulcer Cleansing: Other: N/A N/A soap and water Topical Anesthetic Applied: Other:  lidocaine 4% Treatment Notes Electronic Signature(s) Signed: 04/28/2015 5:21:59 PM By: Curtis Sitesorthy, Joanna Entered By: Curtis Sitesorthy, Joanna on 04/28/2015 15:12:44 [name], [name] ([id number]) -------------------------------------------------------------------------------- Multi-Disciplinary Care Plan Details Patient Name: [name], [name] Date of Service: 04/28/2015 3:00 PM Medical Record Patient Account Number: [id number] Number: Treating RN: Curtis Sitesorthy, Joanna Date of Birth/Sex: Apr 10, 1966 (50 y.o. Female) Other Clinician: Primary Care MCLEAN-SCOCOZZA, Treating BURNS III, Physician: French AnaRACY Physician/Extender: Zollie BeckersWALTER Referring Physician: Blain PaisKhan, Neelam Weeks in Treatment: 14 Active Inactive Orientation to the Wound Care Program Nursing Diagnoses: Knowledge deficit related to the wound healing center program Goals: Patient/caregiver will verbalize understanding of the Wound Healing Center Program Date Initiated: 01/20/2015 Goal Status: Active Interventions: Provide education on orientation to the wound center Notes: Soft Tissue Infection Nursing Diagnoses: Potential for infection: soft tissue Goals: Patient will remain free of wound infection Date Initiated: 01/20/2015 Goal Status: Active Interventions: Assess signs and symptoms of infection every visit Treatment Activities: Culture and sensitivity : 04/28/2015 Notes: Wound/Skin Impairment Nursing Diagnoses: [name], [name] ([id number]) Impaired tissue integrity Goals: Ulcer/skin breakdown will heal within 14 weeks Date Initiated: 01/20/2015 Goal Status: Active Interventions: Assess patient/caregiver ability to obtain necessary supplies Notes: Electronic Signature(s) Signed: 04/28/2015 5:21:59 PM By: Curtis Sitesorthy, Joanna Entered By: Curtis Sitesorthy, Joanna on 04/28/2015 15:12:35 [name], [name] ([id number]) -------------------------------------------------------------------------------- Patient/Caregiver Education Details Patient  Name: [name], [name] Date of Service: 04/28/2015 3:00 PM Medical Record Patient Account Number: [id number] Number: Treating RN: Curtis Sitesorthy, Joanna Date of Birth/Gender: Apr 10, 1966 (49 y.o. Female) Other Clinician: Primary Care MCLEAN-SCOCOZZA, Treating BURNS III, Physician: French AnaRACY Physician/Extender: Zollie BeckersWALTER Referring Physician: Blain PaisKhan, Neelam Weeks in Treatment: 14 Education Assessment Education Provided To: Patient Education Topics Provided Wound/Skin Impairment: Handouts: Other: epifix skin sub Methods: Explain/Verbal Responses: State content correctly Electronic Signature(s) Signed: 04/28/2015 4:32:11 PM By: Curtis Sitesorthy, Joanna Entered By: Curtis Sitesorthy, Joanna on 04/28/2015 16:32:11 [name], [name] ([id number]) -------------------------------------------------------------------------------- Wound Assessment Details Patient Name: [name], [name] Date of Service: 04/28/2015 3:00 PM Medical Record Patient Account Number: [id number] Number: Treating RN: Curtis Sitesorthy, Joanna Date of Birth/Sex: Apr 10, 1966 (49 y.o. Female) Other Clinician: Primary Care MCLEAN-SCOCOZZA, Treating BURNS III, Physician: French AnaRACY Physician/Extender: Zollie BeckersWALTER Referring Physician: Yves DillKhan, Neelam Weeks in Treatment: 14 Wound Status Wound Number: 1 Primary Etiology: Infection - not elsewhere classified Wound Location: Right Lower Leg - Medial Wound Status: Open Wounding Event: Bite Date Acquired: 12/28/2014 Weeks Of Treatment: 14 Clustered Wound: No Photos Photo Uploaded By: Curtis Sitesorthy, Joanna on 04/28/2015 16:43:32 Wound Measurements Length: (cm) 2.5 Width: (cm) 2.5 Depth: (cm) 0.1 Area: (cm) 4.909 Volume: (cm) 0.491 % Reduction in Area: 16.7% % Reduction in Volume: 16.6% Epithelialization: Small (1-33%) Tunneling: No Undermining: No Wound Description Full Thickness Without Exposed Classification: Support Structures Wound Margin: Flat and Intact Exudate Medium Amount: Exudate Type:  Serosanguineous Exudate Color: red, brown Foul Odor After Cleansing: No Wound Bed Granulation Amount: Large (67-100%) Exposed Structure [employer] ([id number]) Granulation Quality: Red,  Hyper-granulation Fascia Exposed: No Necrotic Amount: None Present (0%) Fat Layer Exposed: No Tendon Exposed: No Muscle Exposed: No Joint Exposed: No Bone Exposed: No Limited to Skin Breakdown Periwound Skin Texture Texture Color No Abnormalities Noted: No No Abnormalities Noted: No Callus: No Atrophie Blanche: No Crepitus: No Cyanosis: No Excoriation: No Ecchymosis: No Fluctuance: No Erythema: No Friable: No Hemosiderin Staining: No Induration: No Mottled: No Localized Edema: Yes Pallor: No Rash: No Rubor: No Scarring: Yes Temperature / Pain Moisture Temperature: No Abnormality No Abnormalities Noted: No Tenderness on Palpation: Yes Dry / Scaly: No Maceration: No Moist: Yes Wound Preparation Ulcer Cleansing: Other: soap and water, Topical Anesthetic Applied: Other: lidocaine 4%, Treatment Notes Wound #1 (Right, Medial Lower Leg) 1. Cleansed with: Cleanse wound with antibacterial soap and water 2. Anesthetic Topical Lidocaine 4% cream to wound bed prior to debridement 3. Peri-wound Care: Skin Prep 4. Dressing Applied: Mepitel 5. Secondary Dressing Applied Foam 7. Secured with 3 Layer Compression System - Right Lower Extremity Notes epifix applied today by Dr Lorelle Gibbs, [facility] ([id number]) Electronic Signature(s) Signed: 04/28/2015 5:21:59 PM By: Curtis Sites Entered By: Curtis Sites on 04/28/2015 15:12:24 [name] ([id number]) -------------------------------------------------------------------------------- Vitals Details Patient Name: [name] Date of Service: 04/28/2015 3:00 PM Medical Record Patient Account Number: [id number] Number: Treating RN: Curtis Sites Date of Birth/Sex: 11/23/1965 (49 y.o. Female) Other  Clinician: Primary Care MCLEAN-SCOCOZZA, Treating BURNS III, Physician: French Ana Physician/ExtenderZollie Beckers Referring Physician: Blain Pais in Treatment: 14 Vital Signs Time Taken: 15:00 Temperature (F): 98.2 Height (in): 64 Pulse (bpm): 85 Weight (lbs): 215 Respiratory Rate (breaths/min): 18 Body Mass Index (BMI): 36.9 Blood Pressure (mmHg): 125/76 Reference Range: 80 - 120 mg / dl Electronic Signature(s) Signed: 04/28/2015 5:21:59 PM By: Curtis Sites Entered By: Curtis Sites on 04/28/2015 15:01:08

## 2015-04-29 NOTE — ED Notes (Signed)
Pt denies SI/HI at the time of discharge.

## 2015-04-29 NOTE — BH Assessment (Signed)
Assessment Note  [name] is an 50 y.o. female patient presents to ER due to having the hopes to remain in the hospital, until she transitions into a new Group Home. Patient have a history of coming to the ER with the same complaint. In the past she voice SI with the plans to overdose and or walk in traffic. Patient denies SI with this Clinical research associate. She states, "I just need to stay here till I get in my new place. I already gave them (Current Group Home) my 14 day notice..."  Patient denies SI/HI and AV/H.  Patient currently lives at [facility] [phone])  Diagnosis: Borderline Personality Disorder  Past Medical History:  Past Medical History  Diagnosis Date  . Asthma   . Anxiety   . GERD (gastroesophageal reflux disease)   . Mild cognitive impairment   . Cerebral palsy (HCC)   . Borderline personality disorder   . Depression   . Wound abscess   . Schizoaffective disorder [facility])     Past Surgical History  Procedure Laterality Date  . Cholecystectomy    . Tonsillectomy    . Incision and drainage abscess Right 01/02/2015    Procedure: INCISION AND DRAINAGE ABSCESS;  Surgeon: Tiney Rouge III, MD;  Location: [facility] ORS;  Service: General;  Laterality: Right;    Family History:  Family History  Problem Relation Age of Onset  . Diabetes Mother   . Heart attack Father     Social History:  reports that she has never smoked. She has never used smokeless tobacco. She reports that she does not drink alcohol or use illicit drugs.  Additional Social History:  Alcohol / Drug Use Pain Medications: See PTA Prescriptions: See PTA Over the Counter: See PTA History of alcohol / drug use?: No history of alcohol / drug abuse Longest period of sobriety (when/how long): No history of abuse Negative Consequences of Use:  (No history of abuse) Withdrawal Symptoms:  (No history of abuse)  CIWA: CIWA-Ar BP: 128/78 mmHg Pulse Rate: 72 COWS:    Allergies:  Allergies  Allergen  Reactions  . Penicillins Other (See Comments)    Pt states that she is "just scared to take it".      Home Medications:  (Not in a hospital admission)  OB/GYN Status:  No LMP recorded. Patient has had an implant.  General Assessment Data Location of Assessment: [facility] ED TTS Assessment: In system Is this a Tele or Face-to-Face Assessment?: Face-to-Face Is this an Initial Assessment or a Re-assessment for this encounter?: Initial Assessment Marital status: Single Maiden name: n/a Is patient pregnant?: No Pregnancy Status: No Living Arrangements: Group Home [facility]. Group Home [phone])) Can pt return to current living arrangement?: Yes Admission Status: Voluntary Is patient capable of signing voluntary admission?: Yes Referral Source: Self/Family/Friend Insurance type: Medicare  Medical Screening Exam [facility] Walk-in ONLY) Medical Exam completed: Yes  Crisis Care Plan Living Arrangements: Group Home [facility]. Group Home [phone])) Legal Guardian: Other: (None) Name of Psychiatrist: Dr.Headen Name of Therapist: None  Education Status Is patient currently in school?: No Current Grade: n/a Highest grade of school patient has completed: 10th Grade Name of school: n/a Contact person: n/a  Risk to self with the past 6 months Suicidal Ideation: No Has patient been a risk to self within the past 6 months prior to admission? : No Suicidal Intent: No Has patient had any suicidal intent within the past 6 months prior to admission? : No Is patient at  risk for suicide?: No Suicidal Plan?: No Has patient had any suicidal plan within the past 6 months prior to admission? : No Specify Current Suicidal Plan: No speific plan Access to Means: No Specify Access to Suicidal Means: No speific plan What has been your use of drugs/alcohol within the last 12 months?: None reported Previous Attempts/Gestures: Yes How many times?: 2 (When she was a teenager) Other Self Harm Risks:  Don't like Group Home Triggers for Past Attempts: Other (Comment) (I was hanging with the wrong crowd) Intentional Self Injurious Behavior: None Family Suicide History: No Recent stressful life event(s): Other (Comment) Persecutory voices/beliefs?: No Depression: Yes Depression Symptoms: Feeling angry/irritable, Feeling worthless/self pity, Isolating, Tearfulness Substance abuse history and/or treatment for substance abuse?: No Suicide prevention information given to non-admitted patients: Not applicable  Risk to Others within the past 6 months Homicidal Ideation: No Does patient have any lifetime risk of violence toward others beyond the six months prior to admission? : No Thoughts of Harm to Others: No Current Homicidal Intent: No Current Homicidal Plan: No Access to Homicidal Means: No Identified Victim: None Reported History of harm to others?: No Assessment of Violence: None Noted Violent Behavior Description: No Reported Does patient have access to weapons?: No Criminal Charges Pending?: No Does patient have a court date: No Is patient on probation?: No  Psychosis Hallucinations: None noted Delusions: None noted  Mental Status Report Appearance/Hygiene: Unremarkable, In scrubs, In hospital gown Eye Contact: Fair Motor Activity: Freedom of movement, Unremarkable Speech: Logical/coherent, Slow Level of Consciousness: Alert Mood: Sad, Anxious, Depressed, Pleasant Affect: Appropriate to circumstance, Sad Anxiety Level: Minimal Thought Processes: Coherent, Relevant Judgement: Partial Orientation: Person, Place, Time, Situation, Appropriate for developmental age Obsessive Compulsive Thoughts/Behaviors: Minimal  Cognitive Functioning Concentration: Normal Memory: Recent Intact, Remote Intact IQ: Average Insight: Fair Impulse Control: Fair Appetite: Good Weight Loss: 0 Weight Gain: 0 Sleep: No Change Total Hours of Sleep: 8 Vegetative Symptoms:  None  ADLScreening [facility] Assessment Services) Patient's cognitive ability adequate to safely complete daily activities?: Yes Patient able to express need for assistance with ADLs?: Yes Independently performs ADLs?: Yes (appropriate for developmental age)  Prior Inpatient Therapy Prior Inpatient Therapy: Yes Prior Therapy Dates: Multiple [facility] Reason for Treatment: Depression, SI  Prior Outpatient Therapy Prior Outpatient Therapy: Yes Prior Therapy Dates: 2016 Prior Therapy Facilty/Provider(s): [facility] Reason for Treatment: Depression, SI Does patient have an ACCT team?: No Does patient have Intensive In-House Services?  : No Does patient have [facility] services? : No Does patient have P4CC services?: No  ADL Screening (condition at time of admission) Patient's cognitive ability adequate to safely complete daily activities?: Yes Is the patient deaf or have difficulty hearing?: No Does the patient have difficulty seeing, even when wearing glasses/contacts?: No Does the patient have difficulty concentrating, remembering, or making decisions?: No Patient able to express need for assistance with ADLs?: Yes Does the patient have difficulty dressing or bathing?: No Independently performs ADLs?: Yes (appropriate for developmental age) Does the patient have difficulty walking or climbing stairs?: No Weakness of Legs: None Weakness of Arms/Hands: None  Home Assistive Devices/Equipment Home Assistive Devices/Equipment: None  Therapy Consults (therapy consults require a physician order) PT Evaluation Needed: No OT Evalulation Needed: No SLP Evaluation Needed: No            Additional Information 1:1 In Past 12 Months?: No CIRT Risk: No Elopement Risk: No Does patient have medical clearance?: Yes  Child/Adolescent Assessment Running Away  Risk: Denies (Patient is an adult)  Disposition:  Disposition Initial  Assessment Completed for this Encounter: Yes Disposition of Patient: Other dispositions (To be seen by Psych MD) Other disposition(s): Other (Comment) (To be seen by Psych MD)  On Site Evaluation by:   Reviewed with Physician:     Lilyan Gilford, MS, LCAS, LPC, NCC, CCSI 04/29/2015 6:06 PM
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
## 2015-04-29 NOTE — Discharge Instructions (Signed)
Nonspecific Chest Pain It is often hard to find the cause of chest pain. There is always a chance that your pain could be related to something serious, such as a heart attack or a blood clot in your lungs. Chest pain can also be caused by conditions that are not life-threatening. If you have chest pain, it is very important to follow up with your doctor.  HOME CARE  If you were prescribed an antibiotic medicine, finish it all even if you start to feel better.  Avoid any activities that cause chest pain.  Do not use any tobacco products, including cigarettes, chewing tobacco, or electronic cigarettes. If you need help quitting, ask your doctor.  Do not drink alcohol.  Take medicines only as told by your doctor.  Keep all follow-up visits as told by your doctor. This is important. This includes any further testing if your chest pain does not go away.  Your doctor may tell you to keep your head raised (elevated) while you sleep.  Make lifestyle changes as told by your doctor. These may include:  Getting regular exercise. Ask your doctor to suggest some activities that are safe for you.  Eating a heart-healthy diet. Your doctor or a diet specialist (dietitian) can help you to learn healthy eating options.  Maintaining a healthy weight.  Managing diabetes, if necessary.  Reducing stress. GET HELP IF:  Your chest pain does not go away, even after treatment.  You have a rash with blisters on your chest.  You have a fever. GET HELP RIGHT AWAY IF:  Your chest pain is worse.  You have an increasing cough, or you cough up blood.  You have severe belly (abdominal) pain.  You feel extremely weak.  You pass out (faint).  You have chills.  You have sudden, unexplained chest discomfort.  You have sudden, unexplained discomfort in your arms, back, neck, or jaw.  You have shortness of breath at any time.  You suddenly start to sweat, or your skin gets clammy.  You feel  nauseous.  You vomit.  You suddenly feel light-headed or dizzy.  Your heart begins to beat quickly, or it feels like it is skipping beats. These symptoms may be an emergency. Do not wait to see if the symptoms will go away. Get medical help right away. Call your local emergency services (911 in the [place] Do not drive yourself to the hospital.   This information is not intended to replace advice given to you by your health care provider. Make sure you discuss any questions you have with your health care provider.   Document Released: 09/20/2007 Document Revised: 04/24/2014 Document Reviewed: 11/07/2013 Elsevier Interactive Patient Education 2016 [employer]  Suicidal Feelings: How to Help Yourself Suicide is the taking of one's own life. If you feel as though life is getting too tough to handle and are thinking about suicide, get help right away. To get help:  Call your local emergency services (911 in the [place]  Call a suicide hotline to speak with a trained counselor who understands how you are feeling. The following is a list of suicide hotlines in the [place]. For a list of hotlines in [place], visit [employer]itwww.suicide.org/hotlines/international/[place]-suicide-hotlines.html.  1-800-273-TALK 308-379-2692(1-(916)782-8587).  1-800-SUICIDE 870-282-8007(1-303-571-8241).  ([phone]. This is a hotline for Spanish speakers.  1-638-453-6IWO1-800-799-4TTY 8050821053(1-(720)678-0618). This is a hotline for TTY users.  1-866-4-U-TREVOR (734)447-1799(1-628-336-7625). This is a hotline for lesbian, gay, bisexual, transgender, or questioning youth.  Contact a crisis center or a local suicide prevention  center. To find a crisis center or suicide prevention center:  Call your local hospital, clinic, community service organization, mental health center, social service provider, or health department. Ask for assistance in connecting to a crisis center.  Visit https://www.patel-king.com/ for a list of crisis centers in  the [place], or visit www.suicideprevention.ca/thinking-about-suicide/find-a-crisis-centre for a list of centers in [place].  Visit the following websites:  National Suicide Prevention Lifeline: www.suicidepreventionlifeline.org  Hopeline: www.hopeline.com  [employer] for Suicide Prevention: https://www.ayers.com/  [employer] (for lesbian, gay, bisexual, transgender, or questioning youth): www.thetrevorproject.org HOW CAN I HELP MYSELF FEEL BETTER?  Promise yourself that you will not do anything drastic when you have suicidal feelings. Remember, there is hope. Many people have gotten through suicidal thoughts and feelings, and you will, too. You may have gotten through them before, and this proves that you can get through them again.  Let family, friends, teachers, or counselors know how you are feeling. Try not to isolate yourself from those who care about you. Remember, they will want to help you. Talk with someone every day, even if you do not feel sociable. Face-to-face conversation is best.  Call a mental health professional and see one regularly.  Visit your primary health care provider every year.  Eat a well-balanced diet, and space your meals so you eat regularly.  Get plenty of rest.  Avoid alcohol and drugs, and remove them from your home. They will only make you feel worse.  If you are thinking of taking a lot of medicine, give your medicine to someone who can give it to you one day at a time. If you are on antidepressants and are concerned you will overdose, let your health care provider know so he or she can give you safer medicines. Ask your mental health professional about the possible side effects of any medicines you are taking.  Remove weapons, poisons, knives, and anything else that could harm you from your home.  Try to stick to routines. Follow a schedule every day. Put self-care on your schedule.  Make a list of realistic goals, and cross them  off when you achieve them. Accomplishments give a sense of worth.  Wait until you are feeling better before doing the things you find difficult or unpleasant.  Exercise if you are able. You will feel better if you exercise for even a half hour each day.  Go out in the sun or into nature. This will help you recover from depression faster. If you have a favorite place to walk, go there.  Do the things that have always given you pleasure. Play your favorite music, read a good book, paint a picture, play your favorite instrument, or do anything else that takes your mind off your depression if it is safe to do.  Keep your living space well lit.  When you are feeling well, write yourself a letter about tips and support that you can read when you are not feeling well.  Remember that life's difficulties can be sorted out with help. Conditions can be treated. You can work on thoughts and strategies that serve you well.   This information is not intended to replace advice given to you by your health care provider. Make sure you discuss any questions you have with your health care provider.   Document Released: 10/08/2002 Document Revised: 04/24/2014 Document Reviewed: 07/29/2013 Elsevier Interactive Patient Education [employer]

## 2015-04-29 NOTE — Progress Notes (Signed)
[name] ([id number]) Visit Report for 04/28/2015 Chief Complaint Document Details Patient Name: [name], [name] Date of Service: 04/28/2015 3:00 PM Medical Record Patient Account Number: [id number] Number: Treating RN: Curtis Sites Date of Birth/Sex: Dec 08, 1965 (50 y.o. Female) Other Clinician: Primary Care MCLEAN-SCOCOZZA, Treating BURNS III, Physician: French Ana Physician/Extender: Zollie Beckers Referring Physician: Blain Pais in Treatment: 14 Information Obtained from: Patient Chief Complaint Right calf ulcer, status post I and D of abscess. Electronic Signature(s) Signed: 04/28/2015 3:52:06 PM By: Madelaine Bhat MD Entered By: Madelaine Bhat on 04/28/2015 15:27:31 [name] ([id number]) -------------------------------------------------------------------------------- Cellular or Tissue Based Product Details Patient Name: [name] Date of Service: 04/28/2015 3:00 PM Medical Record Patient Account Number: [id number] Number: Treating RN: Curtis Sites Date of Birth/Sex: [birth year] (49 y.o. Female) Other Clinician: Primary Care MCLEAN-SCOCOZZA, Treating BURNS III, Physician: French Ana Physician/Extender: Zollie Beckers Referring Physician: Blain Pais in Treatment: 14 Cellular or Tissue Based Wound #1 Right,Medial Lower Leg Product Type Applied to: Performed By: Physician BURNS III, Melanie Crazier., MD Cellular or Tissue Based Epifix Product Type: Time-Out Taken: Yes Location: genitalia / hands / feet / multiple digits Wound Size (sq cm): 6.25 Product Size (sq cm): 4 Waste Size (sq cm): 0 Amount of Product Applied (sq cm): 4 Lot #: [id number] Expiration Date: 12/17/2019 Fenestrated: No Reconstituted: No Secured: Yes Secured With: Steri-Strips Dressing Applied: Yes Primary Dressing: mepitel one Procedural Pain: 0 Post Procedural Pain: 0 Response to Treatment: Procedure was tolerated well Post Procedure Diagnosis Same  as Pre-procedure Electronic Signature(s) Signed: 04/28/2015 3:52:06 PM By: Madelaine Bhat MD Entered By: Madelaine Bhat on 04/28/2015 15:27:21 [name] ([id number]) -------------------------------------------------------------------------------- Debridement Details Patient Name: [name] Date of Service: 04/28/2015 3:00 PM Medical Record Patient Account Number: [id number] Number: Treating RN: Curtis Sites Date of Birth/Sex: 1965/08/28 (49 y.o. Female) Other Clinician: Primary Care MCLEAN-SCOCOZZA, Treating BURNS III, Physician: French Ana Physician/ExtenderZollie Beckers Referring Physician: Blain Pais in Treatment: 14 Debridement Performed for Wound #1 Right,Medial Lower Leg Assessment: Performed By: Physician BURNS III, Melanie Crazier., MD Debridement: Debridement Pre-procedure Yes Verification/Time Out Taken: Start Time: 15:18 Pain Control: Lidocaine 4% Topical Solution Level: Skin/Subcutaneous Tissue Total Area Debrided (L x 2.5 (cm) x 2.5 (cm) = 6.25 (cm) W): Tissue and other Viable, Non-Viable, Fat, Fibrin/Slough, Subcutaneous material debrided: Instrument: Curette Bleeding: Minimum Hemostasis Achieved: Pressure End Time: 15:19 Procedural Pain: 0 Post Procedural Pain: 0 Response to Treatment: Procedure was tolerated well Post Debridement Measurements of Total Wound Length: (cm) 2.5 Width: (cm) 2.5 Depth: (cm) 0.2 Volume: (cm) 0.982 Post Procedure Diagnosis Same as Pre-procedure Electronic Signature(s) Signed: 04/28/2015 3:52:06 PM By: Madelaine Bhat MD Signed: 04/28/2015 5:21:59 PM By: Curtis Sites Entered By: Madelaine Bhat on 04/28/2015 15:27:12 [name] ([id number]) -------------------------------------------------------------------------------- HPI Details Patient Name: [name] Date of Service: 04/28/2015 3:00 PM Medical Record Patient Account Number: [id number] Number: Treating RN:  Curtis Sites Date of Birth/Sex: 28-Jun-1965 (49 y.o. Female) Other Clinician: Primary Care MCLEAN-SCOCOZZA, Treating BURNS III, Physician: French Ana Physician/Extender: Zollie Beckers Referring Physician: Blain Pais in Treatment: 14 History of Present Illness HPI Description: Pleasant 50 year old with history of borderline personality disorder and CP. No history of diabetes or peripheral arterial disease. Right ABI 1.2. She developed an ulceration on her right anterior calf secondary to an insect bite. This progressed to an abscess, and she underwent I and D on 01/02/2015 by Dr. Michela Pitcher. Cultures grew oxacillin sensitive staph aureus. She completed a course of clindamycin. She lives in a group home. Punch biopsy  03/24/2015 showed no evidence for malignancy. Negative for granulomatous inflammation and vasculitis. Tolerating 3 layer compression bandage with silver alginate. She returns to clinic for follow-up and is without new complaints. Mild pain. No fever or chills. Minimal drainage. Electronic Signature(s) Signed: 04/28/2015 3:52:06 PM By: Madelaine Bhat MD Entered By: Madelaine Bhat on 04/28/2015 15:27:47 [name] ([id number]) -------------------------------------------------------------------------------- Physical Exam Details Patient Name: [name] Date of Service: 04/28/2015 3:00 PM Medical Record Patient Account Number: [id number] Number: Treating RN: Curtis Sites Date of Birth/Sex: [birth year] (49 y.o. Female) Other Clinician: Primary Care MCLEAN-SCOCOZZA, Treating BURNS III, Physician: French Ana Physician/Extender: Zollie Beckers Referring Physician: Yves Dill Weeks in Treatment: 14 Constitutional . Pulse regular. Respirations normal and unlabored. Afebrile. . Notes Right anterior calf ulceration much improved with compression bandage. Full-thickness. Debrided to healthy bleeding granulation tissue. No evidence for infection. Minimal edema. Palpable  DP. Right ABI 1.2. Electronic Signature(s) Signed: 04/28/2015 3:52:06 PM By: Madelaine Bhat MD Entered By: Madelaine Bhat on 04/28/2015 15:28:08 [name] ([id number]) -------------------------------------------------------------------------------- Physician Orders Details Patient Name: [name] Date of Service: 04/28/2015 3:00 PM Medical Record Patient Account Number: [id number] Number: Treating RN: Curtis Sites Date of Birth/Sex: 11-13-1965 (49 y.o. Female) Other Clinician: Primary Care MCLEAN-SCOCOZZA, Treating BURNS III, Physician: French Ana Physician/ExtenderZollie Beckers Referring Physician: Blain Pais in Treatment: 14 Verbal / Phone Orders: Yes Clinician: Curtis Sites Read Back and Verified: Yes Diagnosis Coding ICD-10 Coding Code Description [phone] Non-pressure chronic ulcer of right calf with fat layer exposed S81.801S Unspecified open wound, right lower leg, sequela F60.3 Borderline personality disorder Wound Cleansing Wound #1 Right,Medial Lower Leg o Cleanse wound with mild soap and water o May shower with protection. Skin Barriers/Peri-Wound Care Wound #1 Right,Medial Lower Leg o Moisturizing lotion Secondary Dressing Wound #1 Right,Medial Lower Leg o XtraSorb - DO NOT REMOVE BELOW THE STERI STRIPS AND MEPITEL - SKIN SUB APPLIED THIS WEEK IN CLINIC Dressing Change Frequency Wound #1 Right,Medial Lower Leg o Other: - twice weekly - Wednesdays at [facility] and once by [facility] Follow-up Appointments Wound #1 Right,Medial Lower Leg o Return Appointment in 1 week. Edema Control Wound #1 Right,Medial Lower Leg o 3 Layer Compression System - Right Lower Extremity - use unna paste to anchor wrap at the top [facility] FEIGE, LOWDERMILK ([id number]) Wound #1 Right,Medial Lower Leg o Continue Home Health Visits o Home Health Nurse may visit PRN to address patientos wound care needs. o FACE TO FACE ENCOUNTER: MEDICARE  and MEDICAID PATIENTS: I certify that this patient is under my care and that I had a face-to-face encounter that meets the physician face-to-face encounter requirements with this patient on this date. The encounter with the patient was in whole or in part for the following MEDICAL CONDITION: (primary reason for Home Healthcare) MEDICAL NECESSITY: I certify, that based on my findings, NURSING services are a medically necessary home health service. HOME BOUND STATUS: I certify that my clinical findings support that this patient is homebound (i.e., Due to illness or injury, pt requires aid of supportive devices such as crutches, cane, wheelchairs, walkers, the use of special transportation or the assistance of another person to leave their place of residence. There is a normal inability to leave the home and doing so requires considerable and taxing effort. Other absences are for medical reasons / religious services and are infrequent or of short duration when for other reasons). o If current dressing causes regression in wound condition, may D/C ordered dressing product/s and apply Normal Saline Moist  Dressing daily until next Wound Healing Center / Other MD appointment. Notify Wound Healing Center of regression in wound condition at [phone]. Advanced Therapies Wound #1 Right,Medial Lower Leg o EpiFix application in clinic; including contact layer, fixation with steri strips, dry gauze and cover dressing. - order 2x2 epifix for next visit Electronic Signature(s) Signed: 04/28/2015 3:42:41 PM By: Curtis Sitesorthy, Joanna Signed: 04/28/2015 3:52:06 PM By: Madelaine BhatBurns, III, Tayanna Talford MD Entered By: Curtis Sitesorthy, Joanna on 04/28/2015 15:42:41 Sherri DonningSHUMATE, [place] ([id number]) -------------------------------------------------------------------------------- Problem List Details Patient Name: Sherri DonningSHUMATE, Alissah Date of Service: 04/28/2015 3:00 PM Medical Record Patient Account Number:  [id number] Number: Treating RN: Curtis Sitesorthy, Joanna Date of Birth/Sex: 1965/06/06 (49 y.o. Female) Other Clinician: Primary Care MCLEAN-SCOCOZZA, Treating BURNS III, Physician: French AnaRACY Physician/Extender: Zollie BeckersWALTER Referring Physician: Yves DillKhan, Neelam Weeks in Treatment: 14 Active Problems ICD-10 Encounter Code Description Active Date Diagnosis L97.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 with fat layer 01/20/2015 Yes exposed S81.801S Unspecified open wound, right lower leg, sequela 01/20/2015 Yes F60.3 Borderline personality disorder 01/20/2015 Yes Inactive Problems Resolved Problems ICD-10 Code Description Active Date Resolved Date L03.115 Cellulitis of right lower limb 04/07/2015 04/07/2015 Electronic Signature(s) Signed: 04/28/2015 3:52:06 PM By: Madelaine BhatBurns, III, Kiesha Ensey MD Entered By: Madelaine BhatBurns, III, Shawntia Mangal on 04/28/2015 15:26:52 Sherri DonningSHUMATE, Marilouise ([id number]) -------------------------------------------------------------------------------- Progress Note Details Patient Name: Sherri DonningSHUMATE, Siera Date of Service: 04/28/2015 3:00 PM Medical Record Patient Account Number: [id number] Number: Treating RN: Curtis Sitesorthy, Joanna Date of Birth/Sex: 1965/06/06 (49 y.o. Female) Other Clinician: Primary Care MCLEAN-SCOCOZZA, Treating BURNS III, Physician: French AnaRACY Physician/Extender: Zollie BeckersWALTER Referring Physician: Blain PaisKhan, Neelam Weeks in Treatment: 14 Subjective Chief Complaint Information obtained from Patient Right calf ulcer, status post I and D of abscess. History of Present Illness (HPI) Pleasant 50 year old with history of borderline personality disorder and CP. No history of diabetes or peripheral arterial disease. Right ABI 1.2. She developed an ulceration on her right anterior calf secondary to an insect bite. This progressed to an abscess, and she underwent I and D on 01/02/2015 by Dr. Michela PitcherEly. Cultures grew oxacillin sensitive staph aureus. She completed a course of clindamycin. She lives  in a group home. Punch biopsy 03/24/2015 showed no evidence for malignancy. Negative for granulomatous inflammation and vasculitis. Tolerating 3 layer compression bandage with silver alginate. She returns to clinic for follow-up and is without new complaints. Mild pain. No fever or chills. Minimal drainage. Objective Constitutional Pulse regular. Respirations normal and unlabored. Afebrile. Vitals Time Taken: 3:00 PM, Height: 64 in, Weight: 215 lbs, BMI: 36.9, Temperature: 98.2 F, Pulse: 85 bpm, Respiratory Rate: 18 breaths/min, Blood Pressure: 125/76 mmHg. General Notes: Right anterior calf ulceration much improved with compression bandage. Full-thickness. Debrided to healthy bleeding granulation tissue. No evidence for infection. Minimal edema. Palpable DP. Right ABI 1.2. Sherri DonningSHUMATE, Evonna ([id number]) Integumentary (Hair, Skin) Wound #1 status is Open. Original cause of wound was Bite. The wound is located on the Right,Medial Lower Leg. The wound measures 2.5cm length x 2.5cm width x 0.1cm depth; 4.909cm^2 area and 0.491cm^3 volume. The wound is limited to skin breakdown. There is no tunneling or undermining noted. There is a medium amount of serosanguineous drainage noted. The wound margin is flat and intact. There is large (67-100%) red granulation within the wound bed. There is no necrotic tissue within the wound bed. The periwound skin appearance exhibited: Localized Edema, Scarring, Moist. The periwound skin appearance did not exhibit: Callus, Crepitus, Excoriation, Fluctuance, Friable, Induration, Rash, Dry/Scaly, Maceration, Atrophie Blanche, Cyanosis, Ecchymosis, Hemosiderin Staining, Mottled, Pallor, Rubor, Erythema. Periwound temperature was noted as No Abnormality. The periwound  has tenderness on palpation. Assessment Active Problems ICD-10 L97.212 - Non-pressure chronic ulcer of right calf with fat layer exposed S81.801S - Unspecified open wound, right lower leg,  sequela F60.3 - Borderline personality disorder Chronic right anterior calf ulceration, status post I and D of abscess. Exacerbated by edema. Procedures Wound #1 Wound #1 is an Infection - not elsewhere classified located on the Right,Medial Lower Leg . There was a Skin/Subcutaneous Tissue Debridement ([id number]) debridement with total area of 6.25 sq cm performed by BURNS III, Melanie Crazier., MD. with the following instrument(s): Curette to remove Viable and Non-Viable tissue/material including Fat, Fibrin/Slough, and Subcutaneous after achieving pain control using Lidocaine 4% Topical Solution. A time out was conducted prior to the start of the procedure. A Minimum amount of bleeding was controlled with Pressure. The procedure was tolerated well with a pain level of 0 throughout and a pain level of 0 following the procedure. Post Debridement Measurements: 2.5cm length x 2.5cm width x 0.2cm depth; 0.982cm^3 volume. Post procedure Diagnosis Wound #1: Same as Pre-Procedure Wound #1 is an Infection - not elsewhere classified located on the Right,Medial Lower Leg. A skin graft procedure using a bioengineered skin substitute/cellular or tissue based product was performed by BURNS III, Melanie Crazier., MD. Epifix was applied and secured with Steri-Strips. 4 sq cm of product was utilized and 0 sq cm was wasted. Post Application, mepitel one was applied. A Time Out was conducted prior to the [place] ([id number]) start of the procedure. The procedure was tolerated well with a pain level of 0 throughout and a pain level of 0 following the procedure. Post procedure Diagnosis Wound #1: Same as Pre-Procedure . Plan Epifix #1 applied today. Continue with Profore light compression bandage. Return to clinic in 1 week. Electronic Signature(s) Signed: 04/28/2015 3:52:06 PM By: Madelaine Bhat MD Entered By: Madelaine Bhat on 04/28/2015 15:28:41 [name]  ([id number]) -------------------------------------------------------------------------------- SuperBill Details Patient Name: [name] Date of Service: 04/28/2015 Medical Record Patient Account Number: [id number] Number: Treating RN: Curtis Sites Date of Birth/Sex: 10-30-1965 (49 y.o. Female) Other Clinician: Primary Care MCLEAN-SCOCOZZA, Treating BURNS III, Physician: French Ana Physician/ExtenderZollie Beckers Referring Physician: Yves Dill Weeks in Treatment: 14 Diagnosis Coding ICD-10 Codes Code Description [id number] Non-pressure chronic ulcer of right calf with fat layer exposed S81.801S Unspecified open wound, right lower leg, sequela F60.3 Borderline personality disorder Facility Procedures CPT4 Code: 84132440 Description: (Facility Use Only) Q4131 o Epifix o Per 1 SQ CM Modifier: Quantity: 4 CPT4 Code: 10272536 Description: 15275 - SKIN SUB GRAFT FACE/NK/HF/G ICD-10 Description Diagnosis L97.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 with fat l Modifier: ayer exposed Quantity: 1 Physician Procedures CPT4 Code: 6440347 Description: 15275 - WC PHYS SKIN SUB GRAFT FACE/NK/HF/G ICD-10 Description Diagnosis L97.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 with fat l Modifier: ayer exposed Quantity: 1 Electronic Signature(s) Signed: 04/28/2015 3:52:06 PM By: Madelaine Bhat MD Entered By: Madelaine Bhat on 04/28/2015 15:28:51

## 2015-04-29 NOTE — ED Provider Notes (Signed)
[facility] Emergency Department Provider Note  Time seen: 3:10 PM  I have reviewed the triage vital signs and the nursing notes.   HISTORY  Chief Complaint Chest Pain    HPI Denene Alamillo is a 50 y.o. female with a past medical history of asthma, gastric reflux, cerebral palsy, MR, schizoaffective, presents the emergency department with chest pain. According to the patient since yesterday she has been expressing chest pain. States it hurts in the middle of her chest, cannot describe the quality for me. Cannot describe a severity. Patient states now she is also having thoughts of killing herself. She states she is supposed to go to a new group home in about a week and is asking if we can just let her stay here until she goes to the new group home.Patient denies any radiation of the pain. Denies any shortness of breath, nausea or diaphoresis.     Past Medical History  Diagnosis Date  . Asthma   . Anxiety   . GERD (gastroesophageal reflux disease)   . Mild cognitive impairment   . Cerebral palsy (HCC)   . Borderline personality disorder   . Depression   . Wound abscess   . Schizoaffective disorder [facility])     Patient Active Problem List   Diagnosis Date Noted  . Anxiety 12/31/2014  . Cellulitis and abscess of leg 12/31/2014  . 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 GERD (gastroesophageal reflux disease) 09/24/2014  . COPD (chronic obstructive pulmonary disease) (HCC) 09/24/2014  . Borderline personality disorder 09/24/2014  . Major depressive disorder, recurrent episode, moderate (HCC) 09/24/2014  . Cerebral palsy (HCC) 08/31/2014    Past Surgical History  Procedure Laterality Date  . Cholecystectomy    . Tonsillectomy    . Incision and drainage abscess Right 01/02/2015    Procedure: INCISION AND DRAINAGE ABSCESS;  Surgeon: Tiney Rouge III, MD;  Location: [facility] ORS;  Service: General;  Laterality: Right;    Current Outpatient Rx  Name  Route   Sig  Dispense  Refill  . acetaminophen (TYLENOL) 500 MG tablet   Oral   Take 500 mg by mouth every 4 (four) hours as needed for mild pain, moderate pain, fever or headache.         . ARIPiprazole (ABILIFY) 10 MG tablet   Oral   Take 1 tablet (10 mg total) by mouth daily.   30 tablet   0   . azithromycin (ZITHROMAX Z-PAK) 250 MG tablet      Take 2 tablets (500 mg) on  Day 1,  followed by 1 tablet (250 mg) once daily on Days 2 through 5.   6 each   0   . diphenoxylate-atropine (LOMOTIL) 2.5-0.025 MG per tablet   Oral   Take 1 tablet by mouth every 6 (six) hours as needed for diarrhea or loose stools.         Marland Kitchen escitalopram (LEXAPRO) 20 MG tablet   Oral   Take 1 tablet (20 mg total) by mouth at bedtime.   30 tablet   0   . fenofibrate 54 MG tablet   Oral   Take 54 mg by mouth daily.         . Fluticasone-Salmeterol (ADVAIR) 100-50 MCG/DOSE AEPB   Inhalation   Inhale 1 puff into the lungs every 12 (twelve) hours.          Marland Kitchen guaiFENesin-codeine 100-10 MG/5ML syrup   Oral   Take 10 mLs by mouth every 4 (four)  hours as needed for cough.   180 mL   0   . ibuprofen (ADVIL,MOTRIN) 800 MG tablet   Oral   Take 1 tablet (800 mg total) by mouth every 8 (eight) hours as needed.   30 tablet   0   . ketoconazole (NIZORAL) 2 % cream   Topical   Apply 1 application topically 2 (two) times daily as needed for irritation. To breast and urinal areas         . meloxicam (MOBIC) 15 MG tablet   Oral   Take 15 mg by mouth daily.         . miconazole (ZEASORB-AF) 2 % powder   Topical   Apply 1 application topically 2 (two) times daily.         . pantoprazole (PROTONIX) 40 MG tablet   Oral   Take 40 mg by mouth daily.         . traMADol (ULTRAM) 50 MG tablet   Oral   Take 50 mg by mouth 2 (two) times daily as needed for moderate pain or severe pain.           Allergies Penicillins  Family History  Problem Relation Age of Onset  . Diabetes Mother   .  Heart attack Father     Social History Social History  Substance Use Topics  . Smoking status: Never Smoker   . Smokeless tobacco: Never Used  . Alcohol Use: No    Review of Systems Constitutional: Negative for fever. Cardiovascular: Positive chest pain 2 days. Respiratory: Negative for shortness of breath. Gastrointestinal: Negative for abdominal pain Neurological: Negative for headache Psychiatric:Positive for suicidal ideation 10-point ROS otherwise negative.  ____________________________________________   PHYSICAL EXAM:  VITAL SIGNS: ED Triage Vitals  Enc Vitals Group     BP --      Pulse --      Resp --      Temp --      Temp src --      SpO2 --      Weight 04/29/15 1238 207 lb (93.895 kg)     Height 04/29/15 1238 5\' 3"  (1.6 m)     Head Cir --      Peak Flow --      Pain Score 04/29/15 1238 10     Pain Loc --      Pain Edu? --      Excl. in GC? --     Constitutional: Alert and oriented. Well appearing and in no distress. Eyes: Normal exam ENT   Head: Normocephalic and atraumatic.   Mouth/Throat: Mucous membranes are moist. Cardiovascular: Normal rate, regular rhythm. No murmur Respiratory: Normal respiratory effort without tachypnea nor retractions. Breath sounds are clear and equal bilaterally. No wheezes/rales/rhonchi. Gastrointestinal: Soft and nontender. No distention Musculoskeletal: Nontender with normal range of motion in all extremities. Right lower extremity wound, chronic Neurologic:  Normal speech and language. No gross focal neurologic deficits Skin:  Skin is warm, dry and intact.  Psychiatric: Mood and affect are normal. Speech and behavior are normal.   ____________________________________________    EKG  EKG reviewed and interpreted by myself shows normal sinus rhythm at 65 bpm, narrow QRS, normal axis, normal intervals, no ST changes. Normal EKG.  ____________________________________________    RADIOLOGY  Chest x-ray  negative  ____________________________________________    INITIAL IMPRESSION / ASSESSMENT AND PLAN / ED COURSE  Pertinent labs & imaging results that were available during my care of the patient were  reviewed by me and considered in my medical decision making (see chart for details).  Patient presents to the emergency department with chest pain 2 days. She also now states she is having thoughts of killing herself. She states she is supposed to go to a new group home and approximately 10 days is asking if we can just keep her here until she goes to group home. Patient was seen and evaluated by psychiatry and discharged 3 days ago. We will have psychiatry see the patient in the emergency department. As far as her chest pain, I will obtain a chest x-ray, her EKG is normal, labs including troponin are normal. Very low suspicion for ACS.   Psychiatry has seen the patient, they believe the patient is safe for discharge home back to her group home. We will discharge the patient.  ____________________________________________   FINAL CLINICAL IMPRESSION(S) / ED DIAGNOSES  Chest pain Suicidal ideation   Minna AntisKevin Wandalene Abrams, MD 04/29/15 1818

## 2015-04-29 NOTE — ED Notes (Signed)
Pt c/o central cp X 2 days that was intermittent and now is constant. Stabbing in nature. No other sx. Pt now also admits to SI. Recent psych admission with DC less than 1 week ago.

## 2015-04-29 NOTE — ED Notes (Signed)

## 2015-04-29 NOTE — ED Notes (Signed)
Pt observed with no unusual behavior  Appropriate to stimulation  No verbalized needs or concerns at this time  NAD assessed  Continue to monitor 

## 2015-05-04 ENCOUNTER — Emergency Department
Admission: EM | Admit: 2015-05-04 | Discharge: 2015-05-04 | Disposition: A | Discharge status: Home / Self Care | Payer: Medicare Other | Attending: Emergency Medicine | Admitting: Emergency Medicine

## 2015-05-04 ENCOUNTER — Emergency Department: Payer: Medicare Other

## 2015-05-04 DIAGNOSIS — Z880 Allergy status to penicillin: Secondary | ICD-10-CM | POA: Diagnosis not present

## 2015-05-04 DIAGNOSIS — F329 Major depressive disorder, single episode, unspecified: Secondary | ICD-10-CM | POA: Diagnosis not present

## 2015-05-04 DIAGNOSIS — R45851 Suicidal ideations: Secondary | ICD-10-CM | POA: Insufficient documentation

## 2015-05-04 DIAGNOSIS — Z79899 Other long term (current) drug therapy: Secondary | ICD-10-CM | POA: Diagnosis not present

## 2015-05-04 DIAGNOSIS — R079 Chest pain, unspecified: Secondary | ICD-10-CM

## 2015-05-04 DIAGNOSIS — Z7951 Long term (current) use of inhaled steroids: Secondary | ICD-10-CM | POA: Insufficient documentation

## 2015-05-04 DIAGNOSIS — F259 Schizoaffective disorder, unspecified: Secondary | ICD-10-CM | POA: Insufficient documentation

## 2015-05-04 DIAGNOSIS — Z791 Long term (current) use of non-steroidal anti-inflammatories (NSAID): Secondary | ICD-10-CM | POA: Diagnosis not present

## 2015-05-04 LAB — COMPREHENSIVE METABOLIC PANEL
ALBUMIN: 3.9 g/dL (ref 3.5–5.0)
ALK PHOS: 52 U/L (ref 38–126)
ALT: 16 U/L (ref 14–54)
ANION GAP: 6 (ref 5–15)
AST: 19 U/L (ref 15–41)
BUN: 17 mg/dL (ref 6–20)
CALCIUM: 9.1 mg/dL (ref 8.9–10.3)
CO2: 29 mmol/L (ref 22–32)
Chloride: 106 mmol/L (ref 101–111)
Creatinine, Ser: 0.76 mg/dL (ref 0.44–1.00)
GFR calc Af Amer: >60 mL/min (ref >60)
GFR calc non Af Amer: >60 mL/min (ref >60)
GLUCOSE: 88 mg/dL (ref 65–99)
Potassium: 3.7 mmol/L (ref 3.5–5.1)
SODIUM: 141 mmol/L (ref 135–145)
Total Bilirubin: 0.3 mg/dL (ref 0.3–1.2)
Total Protein: 7 g/dL (ref 6.5–8.1)

## 2015-05-04 LAB — CBC
HEMATOCRIT: 37.6 % (ref 35.0–47.0)
HEMOGLOBIN: 13 g/dL (ref 12.0–16.0)
MCH: 31.4 pg (ref 26.0–34.0)
MCHC: 34.6 g/dL (ref 32.0–36.0)
MCV: 90.7 fL (ref 80.0–100.0)
Platelets: 284 10*3/uL (ref 150–440)
RBC: 4.14 MIL/uL (ref 3.80–5.20)
RDW: 13.6 % (ref 11.5–14.5)
WBC: 7.4 10*3/uL (ref 3.6–11.0)

## 2015-05-04 LAB — SALICYLATE LEVEL: Salicylate Lvl: <4 mg/dL (ref 2.8–30.0)

## 2015-05-04 LAB — ACETAMINOPHEN LEVEL

## 2015-05-04 LAB — ETHANOL: Alcohol, Ethyl (B): <5 mg/dL (ref <5)

## 2015-05-04 LAB — TROPONIN I

## 2015-05-04 NOTE — Discharge Instructions (Signed)
Please seek medical attention for any high fevers, chest pain, shortness of breath, change in behavior, persistent vomiting, bloody stool or any other new or concerning symptoms. ° ° °Nonspecific Chest Pain °It is often hard to find the cause of chest pain. There is always a chance that your pain could be related to something serious, such as a heart attack or a blood clot in your lungs. Chest pain can also be caused by conditions that are not life-threatening. If you have chest pain, it is very important to follow up with your doctor. ° °HOME CARE °· If you were prescribed an antibiotic medicine, finish it all even if you start to feel better. °· Avoid any activities that cause chest pain. °· Do not use any tobacco products, including cigarettes, chewing tobacco, or electronic cigarettes. If you need help quitting, ask your doctor. °· Do not drink alcohol. °· Take medicines only as told by your doctor. °· Keep all follow-up visits as told by your doctor. This is important. This includes any further testing if your chest pain does not go away. °· Your doctor may tell you to keep your head raised (elevated) while you sleep. °· Make lifestyle changes as told by your doctor. These may include: °¨ Getting regular exercise. Ask your doctor to suggest some activities that are safe for you. °¨ Eating a heart-healthy diet. Your doctor or a diet specialist (dietitian) can help you to learn healthy eating options. °¨ Maintaining a healthy weight. °¨ Managing diabetes, if necessary. °¨ Reducing stress. °GET HELP IF: °· Your chest pain does not go away, even after treatment. °· You have a rash with blisters on your chest. °· You have a fever. °GET HELP RIGHT AWAY IF: °· Your chest pain is worse. °· You have an increasing cough, or you cough up blood. °· You have severe belly (abdominal) pain. °· You feel extremely weak. °· You pass out (faint). °· You have chills. °· You have sudden, unexplained chest discomfort. °· You have  sudden, unexplained discomfort in your arms, back, neck, or jaw. °· You have shortness of breath at any time. °· You suddenly start to sweat, or your skin gets clammy. °· You feel nauseous. °· You vomit. °· You suddenly feel light-headed or dizzy. °· Your heart begins to beat quickly, or it feels like it is skipping beats. °These symptoms may be an emergency. Do not wait to see if the symptoms will go away. Get medical help right away. Call your local emergency services (911 in the [place] Do not drive yourself to the hospital. °  °This information is not intended to replace advice given to you by your health care provider. Make sure you discuss any questions you have with your health care provider. °  °Document Released: 09/20/2007 Document Revised: 04/24/2014 Document Reviewed: 11/07/2013 °Elsevier Interactive Patient Education ©2016 [employer] ° °

## 2015-05-04 NOTE — ED Notes (Signed)
Pt changed into scrubs and belongings put in secure locker area. Pt unable to give urine sample at this time. 

## 2015-05-04 NOTE — ED Provider Notes (Signed)
[facility] Emergency Department Provider Note    ____________________________________________  Time seen: 2210  I have reviewed the triage vital signs and the nursing notes.   HISTORY  Chief Complaint Chest Pain and Mental Health Problem   History limited by: Not Limited   HPI [name] is a 50 y.o. female with history of schizoaffective, MR, asthma who presents to the emergency department today because of concern for chest pain, dislike of group home and stating she is suicidal. The patient has been seen multiple times recently in the emergency department for the same complaints.  On exam patient does primarily complaining of this liking her group home. She would like a number to other group homes. She is concerned that they are taking her phone away. The patient did not endorse any specific plan to me. She denies any fevers.     Past Medical History  Diagnosis Date  . Asthma   . Anxiety   . GERD (gastroesophageal reflux disease)   . Mild cognitive impairment   . Cerebral palsy (HCC)   . Borderline personality disorder   . Depression   . Wound abscess   . Schizoaffective disorder [facility])     Patient Active Problem List   Diagnosis Date Noted  . Anxiety 12/31/2014  . Cellulitis and abscess of leg 12/31/2014  . 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 GERD (gastroesophageal reflux disease) 09/24/2014  . COPD (chronic obstructive pulmonary disease) (HCC) 09/24/2014  . Borderline personality disorder 09/24/2014  . Major depressive disorder, recurrent episode, moderate (HCC) 09/24/2014  . Cerebral palsy (HCC) 08/31/2014    Past Surgical History  Procedure Laterality Date  . Cholecystectomy    . Tonsillectomy    . Incision and drainage abscess Right 01/02/2015    Procedure: INCISION AND DRAINAGE ABSCESS;  Surgeon: Tiney Rouge III, MD;  Location: [facility] ORS;  Service: General;  Laterality: Right;    Current Outpatient Rx  Name  Route  Sig   Dispense  Refill  . acetaminophen (TYLENOL) 500 MG tablet   Oral   Take 500 mg by mouth every 4 (four) hours as needed for mild pain, moderate pain, fever or headache.         . ARIPiprazole (ABILIFY) 10 MG tablet   Oral   Take 1 tablet (10 mg total) by mouth daily.   30 tablet   0   . azithromycin (ZITHROMAX Z-PAK) 250 MG tablet      Take 2 tablets (500 mg) on  Day 1,  followed by 1 tablet (250 mg) once daily on Days 2 through 5.   6 each   0   . diphenoxylate-atropine (LOMOTIL) 2.5-0.025 MG per tablet   Oral   Take 1 tablet by mouth every 6 (six) hours as needed for diarrhea or loose stools.         Marland Kitchen escitalopram (LEXAPRO) 20 MG tablet   Oral   Take 1 tablet (20 mg total) by mouth at bedtime.   30 tablet   0   . fenofibrate 54 MG tablet   Oral   Take 54 mg by mouth daily.         . Fluticasone-Salmeterol (ADVAIR) 100-50 MCG/DOSE AEPB   Inhalation   Inhale 1 puff into the lungs every 12 (twelve) hours.          Marland Kitchen guaiFENesin-codeine 100-10 MG/5ML syrup   Oral   Take 10 mLs by mouth every 4 (four) hours as needed for cough.   180  mL   0   . ibuprofen (ADVIL,MOTRIN) 800 MG tablet   Oral   Take 1 tablet (800 mg total) by mouth every 8 (eight) hours as needed.   30 tablet   0   . ketoconazole (NIZORAL) 2 % cream   Topical   Apply 1 application topically 2 (two) times daily as needed for irritation. To breast and urinal areas         . meloxicam (MOBIC) 15 MG tablet   Oral   Take 15 mg by mouth daily.         . miconazole (ZEASORB-AF) 2 % powder   Topical   Apply 1 application topically 2 (two) times daily.         . pantoprazole (PROTONIX) 40 MG tablet   Oral   Take 40 mg by mouth daily.         . traMADol (ULTRAM) 50 MG tablet   Oral   Take 50 mg by mouth 2 (two) times daily as needed for moderate pain or severe pain.           Allergies Penicillins  Family History  Problem Relation Age of Onset  . Diabetes Mother   . Heart  attack Father     Social History Social History  Substance Use Topics  . Smoking status: Never Smoker   . Smokeless tobacco: Never Used  . Alcohol Use: No    Review of Systems  Constitutional: Negative for fever. Cardiovascular: Positive for chest pain. Respiratory: Negative for shortness of breath. Gastrointestinal: Negative for abdominal pain, vomiting and diarrhea. Neurological: Negative for headaches, focal weakness or numbness. 10-point ROS otherwise negative.  ____________________________________________   PHYSICAL EXAM:  VITAL SIGNS: ED Triage Vitals  Enc Vitals Group     BP 05/04/15 2022 118/63 mmHg     Pulse Rate 05/04/15 2022 67     Resp --      Temp 05/04/15 2022 98.8 F (37.1 C)     Temp Source 05/04/15 2022 Oral     SpO2 05/04/15 2022 96 %     Weight 05/04/15 2022 207 lb (93.895 kg)     Height 05/04/15 2022  (1.626 m)     Head Cir --      Peak Flow --      Pain Score 05/04/15 2022 10   Constitutional: Alert and oriented. Well appearing and in no distress. Eyes: Conjunctivae are normal. PERRL. Normal extraocular movements. ENT   Head: Normocephalic and atraumatic.   Nose: No congestion/rhinnorhea.   Mouth/Throat: Mucous membranes are moist.   Neck: No stridor. Hematological/Lymphatic/Immunilogical: No cervical lymphadenopathy. Cardiovascular: Normal rate, regular rhythm.  No murmurs, rubs, or gallops. Respiratory: Normal respiratory effort without tachypnea nor retractions. Breath sounds are clear and equal bilaterally. No wheezes/rales/rhonchi. Gastrointestinal: Soft and nontender. No distention. There is no CVA tenderness. Genitourinary: Deferred Musculoskeletal: Normal range of motion in all extremities. No joint effusions.  No lower extremity tenderness nor edema. Neurologic:  Normal speech and language. No gross focal neurologic deficits are appreciated.  Skin:  Skin is warm, dry and intact. No rash  noted.  ____________________________________________    LABS (pertinent positives/negatives)  Labs Reviewed  ACETAMINOPHEN LEVEL - Abnormal; Notable for the following:    Acetaminophen (Tylenol), Serum <10 (*)    All other components within normal limits  TROPONIN I  COMPREHENSIVE METABOLIC PANEL  ETHANOL  SALICYLATE LEVEL  CBC  URINE DRUG SCREEN, QUALITATIVE ([facility] ONLY)     ____________________________________________  EKG  I, Phineas Semen, attending physician, personally viewed and interpreted this EKG  EKG Time: 2020 Rate: 69 Rhythm: Normal sinus rhythm Axis: normal Intervals: qtc 456 QRS: narrow ST changes: no st elevation Impression: normal ekg   ____________________________________________    RADIOLOGY  None   ____________________________________________   PROCEDURES  Procedure(s) performed: None  Critical Care performed: No  ____________________________________________   INITIAL IMPRESSION / ASSESSMENT AND PLAN / ED COURSE  Pertinent labs & imaging results that were available during my care of the patient were reviewed by me and considered in my medical decision making (see chart for details).  Patient presents to the emergency department today with multiple complaints however the most concerning issue for her is that she dislikes her group home. She does also endorse some chest pain and suicidal ideation although did not have any plan that she told me. Patient has been seen twice previously in this emergency department in the past 10 days for the exact same symptoms. She has had negative cardiac workup in the past and today blood work and EKG were without concerning findings. The patient, in terms of her suicidal elevation, has had this constantly. Again she did not endorse any specific plan to me although did discuss with the nurse a plan to take pills. This is the same plan she has had previously past. She has been seen by psychiatry both  times with her previous admissions and did not feel she required any inpatient psychiatric evaluation. This point I do not think patient is a risk to herself. I think this is more a chronic issue I do feel that some of it has to do with her dissatisfaction at the group home. We did provide the patient with numbers for other group homes as well as a number for any concerns that she thinks she is being mistreated. Patient was okay with plan to be discharged back to group home to work on establishing residence he had a new group home.  ____________________________________________   FINAL CLINICAL IMPRESSION(S) / ED DIAGNOSES  Final diagnoses:  Chest pain, unspecified chest pain type     Phineas Semen, MD 05/05/15 [phone]

## 2015-05-04 NOTE — ED Notes (Signed)
Waiting on belongings and valuables to be brought back for pt dc.

## 2015-05-04 NOTE — ED Notes (Addendum)
Pt to triage via w/c with no distress noted; pt c/o mid CP and has been seen for same recently; st told "normal but feels like got lumps in there"; pt denies any accomp symptoms; pt also st "I'm suicidal; I hate myself"; st seen for this as well but "feels worse"; admits to plan of overdosing on pills

## 2015-05-04 NOTE — ED Notes (Signed)
Patient states she is hear for SI. Patient states, "I don't know. I just want to kill myself". Denies any triggers. Patient states she has had CP for 4 days and SI for 3 days. Patient states she would take "a bunch of pills".

## 2015-05-05 ENCOUNTER — Encounter: Payer: Medicare Other | Admitting: Surgery

## 2015-05-05 DIAGNOSIS — L97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 with fat layer exposed: Secondary | ICD-10-CM | POA: Diagnosis not present

## 2015-05-06 NOTE — Progress Notes (Signed)
Sherri, Green ([id number]) Visit Report for 05/05/2015 Arrival Information Details Patient Name: [name], [name] Date of Service: 05/05/2015 8:00 AM Medical Record Patient Account Number: [id number] Number: Treating RN: Clover Mealy, RN, BSN, [place] [facility] Date of Birth/Sex: 08-23-1965 (50 y.o. Female) Other Clinician: Primary Care MCLEAN-SCOCOZZA, Treating BURNS III, Physician: French Ana Physician/Extender: Skipper Cliche, Referring Physician: Delmer Islam in Treatment: 15 Visit Information History Since Last Visit Added or deleted any medications: No Patient Arrived: Ambulatory Any new allergies or adverse reactions: No Arrival Time: 08:17 Had a fall or experienced change in No Accompanied By: caregiver activities of daily living that may affect Transfer Assistance: None risk of falls: Patient Identification Verified: Yes Signs or symptoms of abuse/neglect since last No Secondary Verification Process Yes visito Completed: Hospitalized since last visit: No Patient Requires Transmission-Based No Has Dressing in Place as Prescribed: Yes Precautions: Has Compression in Place as Prescribed: Yes Patient Has Alerts: Yes Pain Present Now: No Patient Alerts: aspirin Electronic Signature(s) Signed: 05/05/2015 4:59:08 PM By: Elpidio Eric BSN, RN Entered By: Elpidio Eric on 05/05/2015 08:20:35 [name] ([id number]) -------------------------------------------------------------------------------- Encounter Discharge Information Details Patient Name: [name] Date of Service: 05/05/2015 8:00 AM Medical Record Patient Account Number: [id number] Number: Treating RN: Clover Mealy RN, BSN, [place] [facility] Date of Birth/Sex: 1965-05-24 (50 y.o. Female) Other Clinician: Primary Care MCLEAN-SCOCOZZA, Treating BURNS III, Physician: French Ana Physician/Extender: Skipper Cliche, Referring Physician: Delmer Islam in Treatment: 15 Encounter Discharge Information  Items Discharge Pain Level: 0 Discharge Condition: Stable Ambulatory Status: Ambulatory Discharge Destination: Home Transportation: Other Accompanied By: caregiver Schedule Follow-up Appointment: No Medication Reconciliation completed No and provided to Patient/Care Samaa Ueda: Patient Clinical Summary of Care: Declined Electronic Signature(s) Signed: 05/05/2015 9:06:43 AM By: Gwenlyn Perking Entered By: Gwenlyn Perking on 05/05/2015 09:06:43 [name] ([id number]) -------------------------------------------------------------------------------- Lower Extremity Assessment Details Patient Name: [name] Date of Service: 05/05/2015 8:00 AM Medical Record Patient Account Number: [id number] Number: Treating RN: Clover Mealy RN, BSN, [place] [facility] Date of Birth/Sex: [birth year] (50 y.o. Female) Other Clinician: Primary Care MCLEAN-SCOCOZZA, Treating BURNS III, Physician: French Ana Physician/Extender: Skipper Cliche, Referring Physician: Delmer Islam in Treatment: 15 Vascular Assessment Pulses: Posterior Tibial Dorsalis Pedis Palpable: [Right:Yes] Extremity colors, hair growth, and conditions: Extremity Color: [Right:Normal] Hair Growth on Extremity: [Right:Yes] Temperature of Extremity: [Right:Warm] Capillary Refill: [Right:< 3 seconds] Toe Nail Assessment Left: Right: Thick: No Discolored: No Deformed: No Improper Length and Hygiene: No Electronic Signature(s) Signed: 05/05/2015 4:59:08 PM By: Elpidio Eric BSN, RN Entered By: Elpidio Eric on 05/05/2015 08:24:48 [name] ([id number]) -------------------------------------------------------------------------------- Multi Wound Chart Details Patient Name: [name] Date of Service: 05/05/2015 8:00 AM Medical Record Patient Account Number: [id number] Number: Treating RN: Clover Mealy RN, BSN, [place] [facility] Date of Birth/Sex: Jun 06, 1965 (50 y.o. Female) Other Clinician: Primary Care MCLEAN-SCOCOZZA,  Treating BURNS III, Physician: French Ana Physician/Extender: Skipper Cliche, Referring Physician: Delmer Islam in Treatment: 15 Vital Signs Height(in): 64 Pulse(bpm): 73 Weight(lbs): 215 Blood Pressure 129/84 (mmHg): Body Mass Index(BMI): 37 Temperature(F): 98.3 Respiratory Rate 18 (breaths/min): Photos: [1:No Photos] [N/A:N/A] Wound Location: [1:Right Lower Leg - Medial] [N/A:N/A] Wounding Event: [1:Bite] [N/A:N/A] Primary Etiology: [1:Infection - not elsewhere classified] [N/A:N/A] Date Acquired: [1:12/28/2014] [N/A:N/A] Weeks of Treatment: [1:15] [N/A:N/A] Wound Status: [1:Open] [N/A:N/A] Measurements L x W x D 1.9x2x0.1 [N/A:N/A] (cm) Area (cm) : [1:2.985] [N/A:N/A] Volume (cm) : [1:0.298] [N/A:N/A] % Reduction in Area: [1:49.30%] [N/A:N/A] % Reduction in Volume: 49.40% [N/A:N/A] Classification: [1:Full Thickness Without Exposed Support Structures] [N/A:N/A] Exudate Amount: [1:Small] [N/A:N/A] Exudate Type: [1:Serosanguineous] [N/A:N/A] Exudate Color: [1:red, brown] [N/A:N/A] Wound Margin: [1:Flat and Intact] [N/A:N/A]  Granulation Amount: [1:Large (67-100%)] [N/A:N/A] Granulation Quality: [1:Red, Hyper-granulation] [N/A:N/A] Necrotic Amount: [1:None Present (0%)] [N/A:N/A] Exposed Structures: [1:Fascia: No Fat: No Tendon: No] [N/A:N/A] Muscle: No Joint: No Bone: No Limited to Skin Breakdown Epithelialization: Medium (34-66%) N/A N/A Periwound Skin Texture: Edema: Yes N/A N/A Scarring: Yes Excoriation: No Induration: No Callus: No Crepitus: No Fluctuance: No Friable: No Rash: No Periwound Skin Moist: Yes N/A N/A Moisture: Maceration: No Dry/Scaly: No Periwound Skin Color: Atrophie Blanche: No N/A N/A Cyanosis: No Ecchymosis: No Erythema: No Hemosiderin Staining: No Mottled: No Pallor: No Rubor: No Temperature: No Abnormality N/A N/A Tenderness on No N/A N/A Palpation: Wound Preparation: Ulcer Cleansing: Other: N/A N/A soap and  water Topical Anesthetic Applied: Other: lidocaine 4% Treatment Notes Electronic Signature(s) Signed: 05/05/2015 4:59:08 PM By: Elpidio Eric BSN, RN Entered By: Elpidio Eric on 05/05/2015 08:27:56 [name] ([id number]) -------------------------------------------------------------------------------- Multi-Disciplinary Care Plan Details Patient Name: [name] Date of Service: 05/05/2015 8:00 AM Medical Record Patient Account Number: [id number] Number: Treating RN: Clover Mealy RN, BSN, [place] [facility] Date of Birth/Sex: 03/17/1966 (49 y.o. Female) Other Clinician: Primary Care MCLEAN-SCOCOZZA, Treating BURNS III, Physician: French Ana Physician/Extender: Skipper Cliche, Referring Physician: Delmer Islam in Treatment: 15 Active Inactive Orientation to the Wound Care Program Nursing Diagnoses: Knowledge deficit related to the wound healing center program Goals: Patient/caregiver will verbalize understanding of the Wound Healing Center Program Date Initiated: 01/20/2015 Goal Status: Active Interventions: Provide education on orientation to the wound center Notes: Soft Tissue Infection Nursing Diagnoses: Potential for infection: soft tissue Goals: Patient will remain free of wound infection Date Initiated: 01/20/2015 Goal Status: Active Interventions: Assess signs and symptoms of infection every visit Treatment Activities: Culture and sensitivity : 05/05/2015 Notes: Wound/Skin Impairment NISA, DECAIRE ([id number]) Nursing Diagnoses: Impaired tissue integrity Goals: Ulcer/skin breakdown will heal within 14 weeks Date Initiated: 01/20/2015 Goal Status: Active Interventions: Assess patient/caregiver ability to obtain necessary supplies Notes: Electronic Signature(s) Signed: 05/05/2015 4:59:08 PM By: Elpidio Eric BSN, RN Entered By: Elpidio Eric on 05/05/2015 08:24:54 [name]  ([id number]) -------------------------------------------------------------------------------- Pain Assessment Details Patient Name: [name] Date of Service: 05/05/2015 8:00 AM Medical Record Patient Account Number: [id number] Number: Treating RN: Clover Mealy RN, BSN, [place] [facility] Date of Birth/Sex: 23-Jun-1965 (49 y.o. Female) Other Clinician: Primary Care MCLEAN-SCOCOZZA, Treating BURNS III, Physician: French Ana Physician/Extender: Skipper Cliche, Referring Physician: Delmer Islam in Treatment: 15 Active Problems Location of Pain Severity and Description of Pain Patient Has Paino No Site Locations Pain Management and Medication Current Pain Management: Electronic Signature(s) Signed: 05/05/2015 4:59:08 PM By: Elpidio Eric BSN, RN Entered By: Elpidio Eric on 05/05/2015 08:20:51 [name] ([id number]) -------------------------------------------------------------------------------- Patient/Caregiver Education Details Patient Name: [name] Date of Service: 05/05/2015 8:00 AM Medical Record Patient Account Number: [id number] Number: Treating RN: Clover Mealy RN, BSN, [place] [facility] Date of Birth/Gender: January 31, 1966 (49 y.o. Female) Other Clinician: Primary Care MCLEAN-SCOCOZZA, Treating BURNS III, Physician: French Ana Physician/Extender: Skipper Cliche, Weeks in Treatment: 15 Referring Physician: TRACY Education Assessment Education Provided To: Patient Education Topics Provided Welcome To The Wound Care Center: Methods: Explain/Verbal Responses: State content correctly Electronic Signature(s) Signed: 05/05/2015 4:59:08 PM By: Elpidio Eric BSN, RN Entered By: Elpidio Eric on 05/05/2015 08:50:59 [name] ([id number]) -------------------------------------------------------------------------------- Wound Assessment Details Patient Name: [name] Date of Service: 05/05/2015 8:00 AM Medical Record Patient Account Number:  [id number] Number: Treating RN: Clover Mealy RN, BSN, [place] [facility] Date of Birth/Sex: 1966-01-04 (49 y.o. Female) Other Clinician: Primary Care MCLEAN-SCOCOZZA, Treating BURNS III, Physician: French Ana Physician/Extender: Skipper Cliche, Referring Physician: Delmer Islam in Treatment: 15 Wound Status  Wound Number: 1 Primary Etiology: Infection - not elsewhere classified Wound Location: Right Lower Leg - Medial Wound Status: Open Wounding Event: Bite Date Acquired: 12/28/2014 Weeks Of Treatment: 15 Clustered Wound: No Photos Photo Uploaded By: Elpidio Eric on 05/05/2015 16:46:53 Wound Measurements Length: (cm) 1.9 Width: (cm) 2 Depth: (cm) 0.1 Area: (cm) 2.985 Volume: (cm) 0.298 % Reduction in Area: 49.3% % Reduction in Volume: 49.4% Epithelialization: Medium (34-66%) Tunneling: No Undermining: No Wound Description Full Thickness Without Exposed Classification: Support Structures Wound Margin: Flat and Intact Exudate Small Amount: Exudate Type: Serosanguineous Exudate Color: red, brown Foul Odor After Cleansing: No Wound Bed Goding, Jacy ([id number]) Granulation Amount: Large (67-100%) Exposed Structure Granulation Quality: Red, Hyper-granulation Fascia Exposed: No Necrotic Amount: None Present (0%) Fat Layer Exposed: No Tendon Exposed: No Muscle Exposed: No Joint Exposed: No Bone Exposed: No Limited to Skin Breakdown Periwound Skin Texture Texture Color No Abnormalities Noted: No No Abnormalities Noted: No Callus: No Atrophie Blanche: No Crepitus: No Cyanosis: No Excoriation: No Ecchymosis: No Fluctuance: No Erythema: No Friable: No Hemosiderin Staining: No Induration: No Mottled: No Localized Edema: Yes Pallor: No Rash: No Rubor: No Scarring: Yes Temperature / Pain Moisture Temperature: No Abnormality No Abnormalities Noted: No Dry / Scaly: No Maceration: No Moist: Yes Wound Preparation Ulcer Cleansing: Other: soap and  water, Topical Anesthetic Applied: Other: lidocaine 4%, Treatment Notes Wound #1 (Right, Medial Lower Leg) 1. Cleansed with: Cleanse wound with antibacterial soap and water 3. Peri-wound Care: Moisturizing lotion 4. Dressing Applied: Other dressing (specify in notes) 5. Secondary Dressing Applied Dry Gauze 7. Secured with 3 Layer Compression System - Right Lower Extremity Notes epifix applied today by Dr Lorelle Gibbs, [facility] ([id number]) Electronic Signature(s) Signed: 05/05/2015 4:59:08 PM By: Elpidio Eric BSN, RN Entered By: Elpidio Eric on 05/05/2015 08:24:31 [name] ([id number]) -------------------------------------------------------------------------------- Vitals Details Patient Name: [name] Date of Service: 05/05/2015 8:00 AM Medical Record Patient Account Number: [id number] Number: Treating RN: Clover Mealy RN, BSN, [place] [facility] Date of Birth/Sex: 1965/10/18 (49 y.o. Female) Other Clinician: Primary Care MCLEAN-SCOCOZZA, Treating BURNS III, Physician: French Ana Physician/Extender: Skipper Cliche, Referring Physician: Delmer Islam in Treatment: 15 Vital Signs Time Taken: 08:21 Temperature (F): 98.3 Height (in): 64 Pulse (bpm): 73 Weight (lbs): 215 Respiratory Rate (breaths/min): 18 Body Mass Index (BMI): 36.9 Blood Pressure (mmHg): 129/84 Reference Range: 80 - 120 mg / dl Electronic Signature(s) Signed: 05/05/2015 4:59:08 PM By: Elpidio Eric BSN, RN Entered By: Elpidio Eric on 05/05/2015 08:21:19

## 2015-05-06 NOTE — Progress Notes (Signed)
[name] ([id number]) Visit Report for 05/05/2015 Chief Complaint Document Details Patient Name: [name], [name] Date of Service: 05/05/2015 8:00 AM Medical Record Patient Account Number: [id number] Number: Treating RN: Clover Mealy, RN, BSN, [place] Sink Date of Birth/Sex: [birth year] (50 y.o. Female) Other Clinician: Primary Care MCLEAN-SCOCOZZA, Treating BURNS III, Physician: French Ana Physician/Extender: Skipper Cliche, Referring Physician: Delmer Islam in Treatment: 15 Information Obtained from: Patient Chief Complaint Right calf ulcer, status post I and D of abscess. Electronic Signature(s) Signed: 05/05/2015 11:51:43 AM By: Madelaine Bhat MD Entered By: Madelaine Bhat on 05/05/2015 08:42:35 [name] ([id number]) -------------------------------------------------------------------------------- Cellular or Tissue Based Product Details Patient Name: [name] Date of Service: 05/05/2015 8:00 AM Medical Record Patient Account Number: [id number] Number: Treating RN: Clover Mealy RN, BSN, [place] Sink Date of Birth/Sex: 03-Dec-1965 (50 y.o. Female) Other Clinician: Primary Care MCLEAN-SCOCOZZA, Treating BURNS III, Physician: French Ana Physician/Extender: Skipper Cliche, Referring Physician: Delmer Islam in Treatment: 15 Cellular or Tissue Based Wound #1 Right,Medial Lower Leg Product Type Applied to: Performed By: Physician BURNS III, Melanie Crazier., MD Cellular or Tissue Based Epifix Product Type: Time-Out Taken: Yes Location: trunk / arms / legs Wound Size (sq cm): 3.8 Product Size (sq cm): 4 Waste Size (sq cm): 0 Amount of Product Applied (sq cm): 4 Lot #: [id number] Order #: [id number] Expiration Date: 05/19/2019 Fenestrated: No Reconstituted: No Secured: Yes Secured With: Steri-Strips Dressing Applied: Yes Primary Dressing: Mepitel 1 Procedural Pain: 0 Post Procedural Pain: 0 Response to Treatment: Procedure was tolerated well Post  Procedure Diagnosis Same as Pre-procedure Electronic Signature(s) Signed: 05/05/2015 11:51:43 AM By: Madelaine Bhat MD Entered By: Madelaine Bhat on 05/05/2015 09:24:02 [name] ([id number]) -------------------------------------------------------------------------------- HPI Details Patient Name: [name] Date of Service: 05/05/2015 8:00 AM Medical Record Patient Account Number: [id number] Number: Treating RN: Clover Mealy RN, BSN, [place] Sink Date of Birth/Sex: 1965/12/14 (50 y.o. Female) Other Clinician: Primary Care MCLEAN-SCOCOZZA, Treating BURNS III, Physician: French Ana Physician/Extender: Skipper Cliche, Referring Physician: Delmer Islam in Treatment: 15 History of Present Illness HPI Description: Pleasant 50 year old with history of borderline personality disorder and CP. No history of diabetes or peripheral arterial disease. Right ABI 1.2. She developed an ulceration on her right anterior calf secondary to an insect bite. This progressed to an abscess, and she underwent I and D on 01/02/2015 by Dr. Michela Pitcher. Cultures grew oxacillin sensitive staph aureus. She completed a course of clindamycin. She lives in a group home. Punch biopsy 03/24/2015 showed no evidence for malignancy. Negative for granulomatous inflammation and vasculitis. Tolerating 3 layer compression bandage with silver alginate 3 times weekly. Epifix #1 applied 04/28/2015. Showing significant improvement recently. She returns to clinic for follow-up and is without new complaints. No significant pain. No fever or chills. Minimal drainage. Electronic Signature(s) Signed: 05/05/2015 11:51:43 AM By: Madelaine Bhat MD Entered By: Madelaine Bhat on 05/05/2015 08:53:12 [name] ([id number]) -------------------------------------------------------------------------------- Physical Exam Details Patient Name: [name] Date of Service: 05/05/2015 8:00 AM Medical Record  Patient Account Number: [id number] Number: Treating RN: Clover Mealy RN, BSN, [place] Sink Date of Birth/Sex: 09-21-1965 (50 y.o. Female) Other Clinician: Primary Care MCLEAN-SCOCOZZA, Treating BURNS III, Physician: French Ana Physician/Extender: Skipper Cliche, Referring Physician: Delmer Islam in Treatment: 15 Constitutional . Pulse regular. Respirations normal and unlabored. Afebrile. . Notes Right anterior calf ulceration much improved with compression wraps and Epifix. Full-thickness. Debrided to healthy bleeding granulation tissue. No evidence for infection. Minimal edema. Palpable DP. Right ABI 1.2. Electronic Signature(s) Signed: 05/05/2015 11:51:43 AM By: Madelaine Bhat MD Entered By: Reino Bellis,  Davidjames Blansett on 05/05/2015 08:53:49 CHRISTIANNA, [place]MON[place] ([id number]) -------------------------------------------------------------------------------- Physician Orders Details Patient Name: [name] Date of Service: 05/05/2015 8:00 AM Medical Record Patient Account Number: [id number] Number: Treating RN: Clover Mealy, RN, BSN, [place] Sink Date of Birth/Sex: 12-13-1965 (50 y.o. Female) Other Clinician: Primary Care MCLEAN-SCOCOZZA, Treating BURNS III, Physician: French Ana Physician/Extender: Skipper Cliche, Referring Physician: Delmer Islam in Treatment: 60 Verbal / Phone Orders: Yes Clinician: Afful, RN, BSN, Rita Read Back and Verified: Yes Diagnosis Coding ICD-10 Coding Code Description [phone] Non-pressure chronic ulcer of right calf with fat layer exposed S81.801S Unspecified open wound, right lower leg, sequela F60.3 Borderline personality disorder Wound Cleansing Wound #1 Right,Medial Lower Leg o Cleanse wound with mild soap and water o May shower with protection. Skin Barriers/Peri-Wound Care Wound #1 Right,Medial Lower Leg o Moisturizing lotion Secondary Dressing Wound #1 Right,Medial Lower Leg o Dry Gauze Dressing Change Frequency Wound #1  Right,Medial Lower Leg o Change dressing every week - Do Not remove Follow-up Appointments Wound #1 Right,Medial Lower Leg o Return Appointment in 1 week. Edema Control Wound #1 Right,Medial Lower Leg o 3 Layer Compression System - Right Lower Extremity - use unna paste to anchor wrap at the top [facility] JULIETTE, STANDRE ([id number]) Wound #1 Right,Medial Lower Leg o Continue Home Health Visits o Home Health Nurse may visit PRN to address patientos wound care needs. o FACE TO FACE ENCOUNTER: MEDICARE and MEDICAID PATIENTS: I certify that this patient is under my care and that I had a face-to-face encounter that meets the physician face-to-face encounter requirements with this patient on this date. The encounter with the patient was in whole or in part for the following MEDICAL CONDITION: (primary reason for Home Healthcare) MEDICAL NECESSITY: I certify, that based on my findings, NURSING services are a medically necessary home health service. HOME BOUND STATUS: I certify that my clinical findings support that this patient is homebound (i.e., Due to illness or injury, pt requires aid of supportive devices such as crutches, cane, wheelchairs, walkers, the use of special transportation or the assistance of another person to leave their place of residence. There is a normal inability to leave the home and doing so requires considerable and taxing effort. Other absences are for medical reasons / religious services and are infrequent or of short duration when for other reasons). o If current dressing causes regression in wound condition, may D/C ordered dressing product/s and apply Normal Saline Moist Dressing daily until next Wound Healing Center / Other MD appointment. Notify Wound Healing Center of regression in wound condition at [phone]. Advanced Therapies Wound #1 Right,Medial Lower Leg o EpiFix application in clinic; including contact layer, fixation with steri  strips, dry gauze and cover dressing. - order 2x2 epifix for next visit Electronic Signature(s) Signed: 05/05/2015 11:51:43 AM By: Madelaine Bhat MD Signed: 05/05/2015 4:59:08 PM By: Elpidio Eric BSN, RN Entered By: Elpidio Eric on 05/05/2015 08:49:57 [name] ([id number]) -------------------------------------------------------------------------------- Problem List Details Patient Name: [name] Date of Service: 05/05/2015 8:00 AM Medical Record Patient Account Number: [id number] Number: Treating RN: Clover Mealy RN, BSN, [place] Sink Date of Birth/Sex: May 25, 1965 (50 y.o. Female) Other Clinician: Primary Care MCLEAN-SCOCOZZA, Treating BURNS III, Physician: French Ana Physician/Extender: Skipper Cliche, Referring Physician: Delmer Islam in Treatment: 15 Active Problems ICD-10 Encounter Code Description Active Date Diagnosis L97.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 with fat layer 01/20/2015 Yes exposed S81.801S Unspecified open wound, right lower leg, sequela 01/20/2015 Yes F60.3 Borderline personality disorder 01/20/2015 Yes Inactive Problems Resolved Problems ICD-10 Code Description Active  Date Resolved Date L03.115 Cellulitis of right lower limb 04/07/2015 04/07/2015 Electronic Signature(s) Signed: 05/05/2015 11:51:43 AM By: Madelaine Bhat MD Entered By: Madelaine Bhat on 05/05/2015 08:42:19 [name] ([id number]) -------------------------------------------------------------------------------- Progress Note Details Patient Name: [name] Date of Service: 05/05/2015 8:00 AM Medical Record Patient Account Number: [id number] Number: Treating RN: Clover Mealy RN, BSN, [place] Sink Date of Birth/Sex: May 21, 1965 (50 y.o. Female) Other Clinician: Primary Care MCLEAN-SCOCOZZA, Treating BURNS III, Physician: French Ana Physician/Extender: Skipper Cliche, Referring Physician: Delmer Islam in Treatment: 15 Subjective Chief  Complaint Information obtained from Patient Right calf ulcer, status post I and D of abscess. History of Present Illness (HPI) Pleasant 50 year old with history of borderline personality disorder and CP. No history of diabetes or peripheral arterial disease. Right ABI 1.2. She developed an ulceration on her right anterior calf secondary to an insect bite. This progressed to an abscess, and she underwent I and D on 01/02/2015 by Dr. Michela Pitcher. Cultures grew oxacillin sensitive staph aureus. She completed a course of clindamycin. She lives in a group home. Punch biopsy 03/24/2015 showed no evidence for malignancy. Negative for granulomatous inflammation and vasculitis. Tolerating 3 layer compression bandage with silver alginate 3 times weekly. Epifix #1 applied 04/28/2015. Showing significant improvement recently. She returns to clinic for follow-up and is without new complaints. No significant pain. No fever or chills. Minimal drainage. Objective Constitutional Pulse regular. Respirations normal and unlabored. Afebrile. Vitals Time Taken: 8:21 AM, Height: 64 in, Weight: 215 lbs, BMI: 36.9, Temperature: 98.3 F, Pulse: 73 bpm, Respiratory Rate: 18 breaths/min, Blood Pressure: 129/84 mmHg. General Notes: Right anterior calf ulceration much improved with compression wraps and Epifix. Full- thickness. Debrided to healthy bleeding granulation tissue. No evidence for infection. Minimal edema. PHALLON, HAYDU ([id number]) Palpable DP. Right ABI 1.2. Integumentary (Hair, Skin) Wound #1 status is Open. Original cause of wound was Bite. The wound is located on the Right,Medial Lower Leg. The wound measures 1.9cm length x 2cm width x 0.1cm depth; 2.985cm^2 area and 0.298cm^3 volume. The wound is limited to skin breakdown. There is no tunneling or undermining noted. There is a small amount of serosanguineous drainage noted. The wound margin is flat and intact. There is large (67-100%) red granulation  within the wound bed. There is no necrotic tissue within the wound bed. The periwound skin appearance exhibited: Localized Edema, Scarring, Moist. The periwound skin appearance did not exhibit: Callus, Crepitus, Excoriation, Fluctuance, Friable, Induration, Rash, Dry/Scaly, Maceration, Atrophie Blanche, Cyanosis, Ecchymosis, Hemosiderin Staining, Mottled, Pallor, Rubor, Erythema. Periwound temperature was noted as No Abnormality. Assessment Active Problems ICD-10 L97.212 - Non-pressure chronic ulcer of right calf with fat layer exposed S81.801S - Unspecified open wound, right lower leg, sequela F60.3 - Borderline personality disorder Chronic right anterior calf ulceration, status post I and D of abscess. Procedures Wound #1 Wound #1 is an Infection - not elsewhere classified located on the Right,Medial Lower Leg. A skin graft procedure using a bioengineered skin substitute/cellular or tissue based product was performed by BURNS III, Melanie Crazier., MD. Epifix was applied and secured with Steri-Strips. 4 sq cm of product was utilized and 0 sq cm was wasted. Post Application, Mepitel 1 was applied. A Time Out was conducted prior to the start of the procedure. The procedure was tolerated well with a pain level of 0 throughout and a pain level of 0 following the procedure. Post procedure Diagnosis Wound #1: Same as Pre-Procedure . SELMA, MINK ([id number]) Plan Wound Cleansing: Wound #1 Right,Medial Lower Leg: Cleanse wound with mild soap  and water May shower with protection. Skin Barriers/Peri-Wound Care: Wound #1 Right,Medial Lower Leg: Moisturizing lotion Secondary Dressing: Wound #1 Right,Medial Lower Leg: Dry Gauze Dressing Change Frequency: Wound #1 Right,Medial Lower Leg: Change dressing every week - Do Not remove Follow-up Appointments: Wound #1 Right,Medial Lower Leg: Return Appointment in 1 week. Edema Control: Wound #1 Right,Medial Lower Leg: 3 Layer Compression System  - Right Lower Extremity - use unna paste to anchor wrap at the top Home Health: Wound #1 Right,Medial Lower Leg: Continue Home Health Visits Home Health Nurse may visit PRN to address patient s wound care needs. FACE TO FACE ENCOUNTER: MEDICARE and MEDICAID PATIENTS: I certify that this patient is under my care and that I had a face-to-face encounter that meets the physician face-to-face encounter requirements with this patient on this date. The encounter with the patient was in whole or in part for the following MEDICAL CONDITION: (primary reason for Home Healthcare) MEDICAL NECESSITY: I certify, that based on my findings, NURSING services are a medically necessary home health service. HOME BOUND STATUS: I certify that my clinical findings support that this patient is homebound (i.e., Due to illness or injury, pt requires aid of supportive devices such as crutches, cane, wheelchairs, walkers, the use of special transportation or the assistance of another person to leave their place of residence. There is a normal inability to leave the home and doing so requires considerable and taxing effort. Other absences are for medical reasons / religious services and are infrequent or of short duration when for other reasons). If current dressing causes regression in wound condition, may D/C ordered dressing product/s and apply Normal Saline Moist Dressing daily until next Wound Healing Center / Other MD appointment. Notify Wound Healing Center of regression in wound condition at [phone]. Advanced Therapies: Wound #1 Right,Medial Lower Leg: EpiFix application in clinic; including contact layer, fixation with steri strips, dry gauze and cover dressing. - order 2x2 epifix for next visit ADELL, KOVAL ([id number]) Epifix #2 applied today. Continue with Profore light compression bandages 3 times weekly. Return to clinic in 1 week. Electronic Signature(s) Signed: 05/05/2015 11:51:43 AM By: Madelaine Bhat MD Entered By: Madelaine Bhat on 05/05/2015 09:24:17 [name] ([id number]) -------------------------------------------------------------------------------- SuperBill Details Patient Name: [name] Date of Service: 05/05/2015 Medical Record Patient Account Number: [id number] Number: Afful, RN, BSN, Treating RN: Date of Birth/Sex: May 25, 1965 (50 y.o. Female) [facility], Other Clinician: Physician: French Ana Treating BURNS III, MCLEAN-SCOCOZZA, Physician/Extender: Zollie Beckers Referring Physician: Delmer Islam in Treatment: 15 Diagnosis Coding ICD-10 Codes Code Description [phone] Non-pressure chronic ulcer of right calf with fat layer exposed S81.801S Unspecified open wound, right lower leg, sequela F60.3 Borderline personality disorder Facility Procedures CPT4 Code: [id number] Description: (Facility Use Only) Q4131 o Epifix o Per 1 SQ CM Modifier: Quantity: 4 CPT4 Code: [id number] Description: 15271 - SKIN SUB GRAFT TRNK/ARM/LEG ICD-10 Description Diagnosis L97.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 with fat l Modifier: ayer exposed Quantity: 1 Physician Procedures CPT4 Code: [id number] Description: 15271 - WC PHYS SKIN SUB GRAFT TRNK/ARM/LEG ICD-10 Description Diagnosis L97.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 with fat l Modifier: ayer exposed Quantity: 1 Electronic Signature(s) Signed: 05/05/2015 11:51:43 AM By: Madelaine Bhat MD Entered By: Madelaine Bhat on 05/05/2015 09:24:37

## 2015-05-12 ENCOUNTER — Encounter: Payer: Medicare Other | Admitting: Surgery

## 2015-05-12 DIAGNOSIS — L97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 with fat layer exposed: Secondary | ICD-10-CM | POA: Diagnosis not present

## 2015-05-13 NOTE — Progress Notes (Signed)
EADIE, REPETTO ([id number]) Visit Report for 05/12/2015 Chief Complaint Document Details Patient Name: [name], [name] 05/12/2015 10:45 Date of Service: AM Medical Record [id number] Number: Patient Account Number: [id number] Date of Birth/Sex: February 09, 1966 (50 y.o. Female) Treating RN: Ashok Cordia, Debi Primary Care [facility], Other Clinician: Physician: French Ana Treating Rey Fors, Verdia Kuba, Physician/Extender: Referring Physician: Delmer Islam in Treatment: 16 Information Obtained from: Patient Chief Complaint Right calf ulcer, status post I and D of abscess. Electronic Signature(s) Signed: 05/12/2015 11:52:56 AM By: Evlyn Kanner MD, FACS Entered By: Evlyn Kanner on 05/12/2015 11:52:56 [name] ([id number]) -------------------------------------------------------------------------------- Cellular or Tissue Based Product Details Patient Name: [name], [name] 05/12/2015 10:45 Date of Service: AM Medical Record [id number] Number: Patient Account Number: [id number] Date of Birth/Sex: 10-Jun-1965 (49 y.o. Female) Treating RN: Ashok Cordia, Debi Primary Care [facility], Other Clinician: Physician: French Ana Treating Wallis Spizzirri, Verdia Kuba, Physician/Extender: Referring Physician: Delmer Islam in Treatment: 16 Cellular or Tissue Based Wound #1 Right,Medial Lower Leg Product Type Applied to: Performed By: Physician Evlyn Kanner, MD Cellular or Tissue Based Epifix Product Type: Time-Out Taken: Yes Location: trunk / arms / legs Wound Size (sq cm): 2.4 Product Size (sq cm): 4 Waste Size (sq cm): 1 Waste Reason: wound size Amount of Product Applied (sq cm): 3 Lot #: [id number] Order #: [id number] Expiration Date: 12/17/2019 Fenestrated: No Reconstituted: Yes Solution Type: saline Solution Amount: 2ml Lot #: [id number] Solution Expiration 01/15/2017 Date: Secured: Yes Secured With: Steri-Strips Dressing Applied: Yes Primary Dressing:  mepitel Procedural Pain: 0 Post Procedural Pain: 0 Response to Treatment: Procedure was tolerated well Post Procedure Diagnosis Same as Pre-procedure Electronic Signature(s) Signed: 05/12/2015 5:42:28 PM By: Hulan Fray ([id number]) Previous Signature: 05/12/2015 11:52:48 AM Version By: Evlyn Kanner MD, FACS Entered By: Alejandro Mulling on 05/12/2015 12:07:47 [name] ([id number]) -------------------------------------------------------------------------------- HPI Details Patient Name: [name], [name] 05/12/2015 10:45 Date of Service: AM Medical Record [id number] Number: Patient Account Number: [id number] Date of Birth/Sex: [birth year] (49 y.o. Female) Treating RN: Ashok Cordia, Debi Primary Care [facility], Other Clinician: Physician: French Ana Treating Obdulio Mash, Verdia Kuba, Physician/Extender: Referring Physician: Delmer Islam in Treatment: 16 History of Present Illness HPI Description: Pleasant 50 year old with history of borderline personality disorder and CP. No history of diabetes or peripheral arterial disease. Right ABI 1.2. She developed an ulceration on her right anterior calf secondary to an insect bite. This progressed to an abscess, and she underwent I and D on 01/02/2015 by Dr. Michela Pitcher. Cultures grew oxacillin sensitive staph aureus. She completed a course of clindamycin. She lives in a group home. Punch biopsy 03/24/2015 showed no evidence for malignancy. Negative for granulomatous inflammation and vasculitis. Tolerating 3 layer compression bandage with silver alginate 3 times weekly. Epifix #1 applied 04/28/2015. Showing significant improvement recently. She returns to clinic for follow-up and is without new complaints. No significant pain. No fever or chills. Minimal drainage. 05/12/2015 -- here for the third application of a Epifix today. Electronic Signature(s) Signed: 05/12/2015 11:53:39 AM By: Evlyn Kanner MD, FACS Entered  By: Evlyn Kanner on 05/12/2015 11:53:39 [name] ([id number]) -------------------------------------------------------------------------------- Physical Exam Details Patient Name: [name], [name] 05/12/2015 10:45 Date of Service: AM Medical Record [id number] Number: Patient Account Number: [id number] Date of Birth/Sex: March 25, 1966 (49 y.o. Female) Treating RN: Ashok Cordia, Debi Primary Care [facility], Other Clinician: Physician: French Ana Treating Roel Douthat, Verdia Kuba, Physician/Extender: Referring Physician: Delmer Islam in Treatment: 16 Constitutional . Pulse regular. Respirations normal and unlabored. Afebrile. . Eyes Nonicteric. Reactive to light. Ears, Nose, Mouth, and Throat Lips, teeth, and gums WNL.Marland Kitchen Moist mucosa without lesions. Neck supple and nontender.  No palpable supraclavicular or cervical adenopathy. Normal sized without goiter. Respiratory WNL. No retractions.. Cardiovascular Pedal Pulses WNL. No clubbing, cyanosis or edema. Lymphatic No adneopathy. No adenopathy. No adenopathy. Musculoskeletal Adexa without tenderness or enlargement.. Digits and nails w/o clubbing, cyanosis, infection, petechiae, ischemia, or inflammatory conditions.. Integumentary (Hair, Skin) No suspicious lesions. No crepitus or fluctuance. No peri-wound warmth or erythema. No masses.Marland Kitchen Psychiatric Judgement and insight Intact.. No evidence of depression, anxiety, or agitation.. Notes the ulceration on the right calf looks excellent and is ready for the third application of a epifix today. Electronic Signature(s) Signed: 05/12/2015 11:54:26 AM By: Evlyn Kanner MD, FACS Entered By: Evlyn Kanner on 05/12/2015 11:54:25 [name] ([id number]) -------------------------------------------------------------------------------- Physician Orders Details Patient Name: [name], [name] 05/12/2015 10:45 Date of Service: AM Medical Record [id number] Number: Patient  Account Number: [id number] Date of Birth/Sex: Dec 09, 1965 (49 y.o. Female) Treating RN: Ashok Cordia, Debi Primary Care [facility], Other Clinician: Physician: French Ana Treating Damichael Hofman, Verdia Kuba, Physician/Extender: Referring Physician: Delmer Islam in Treatment: 85 Verbal / Phone Orders: Yes Clinician: Ashok Cordia, Debi Read Back and Verified: Yes Diagnosis Coding Wound Cleansing Wound #1 Right,Medial Lower Leg o Cleanse wound with mild soap and water o May shower with protection. Skin Barriers/Peri-Wound Care Wound #1 Right,Medial Lower Leg o Moisturizing lotion Secondary Dressing Wound #1 Right,Medial Lower Leg o Dry Gauze Dressing Change Frequency Wound #1 Right,Medial Lower Leg o Change dressing every week - Do Not remove Follow-up Appointments Wound #1 Right,Medial Lower Leg o Return Appointment in 1 week. Edema Control Wound #1 Right,Medial Lower Leg o 3 Layer Compression System - Right Lower Extremity - use unna paste to anchor wrap at the top Home Health Wound #1 Right,Medial Lower Leg o Continue Home Health Visits o Home Health Nurse may visit PRN to address patientos wound care needs. o FACE TO FACE ENCOUNTER: MEDICARE and MEDICAID PATIENTS: I certify that this patient is under my care and that I had a face-to-face encounter that meets the physician face-to-face encounter requirements with this patient on this date. The encounter with the patient was in [place], [place] ([id number]) whole or in part for the following MEDICAL CONDITION: (primary reason for Home Healthcare) MEDICAL NECESSITY: I certify, that based on my findings, NURSING services are a medically necessary home health service. HOME BOUND STATUS: I certify that my clinical findings support that this patient is homebound (i.e., Due to illness or injury, pt requires aid of supportive devices such as crutches, cane, wheelchairs, walkers, the use of special transportation or  the assistance of another person to leave their place of residence. There is a normal inability to leave the home and doing so requires considerable and taxing effort. Other absences are for medical reasons / religious services and are infrequent or of short duration when for other reasons). o If current dressing causes regression in wound condition, may D/C ordered dressing product/s and apply Normal Saline Moist Dressing daily until next Wound Healing Center / Other MD appointment. Notify Wound Healing Center of regression in wound condition at [phone]. Electronic Signature(s) Signed: 05/12/2015 4:12:51 PM By: Evlyn Kanner MD, FACS Signed: 05/12/2015 5:42:28 PM By: Alejandro Mulling Entered By: Alejandro Mulling on 05/12/2015 11:24:02 [name] ([id number]) -------------------------------------------------------------------------------- Problem List Details Patient Name: JARI, CAROLLO 05/12/2015 10:45 Date of Service: AM Medical Record [id number] Number: Patient Account Number: [id number] Date of Birth/Sex: 1966/01/12 (50 y.o. 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 S81.801S Unspecified open wound, right lower leg, sequela 01/20/2015 Yes F60.3 Borderline personality disorder 01/20/2015 Yes Inactive Problems Resolved Problems ICD-10 Code Description Active Date Resolved Date L03.115 Cellulitis of right lower limb 04/07/2015 04/07/2015 Electronic Signature(s) Signed: 05/12/2015 11:52:41 AM By: Evlyn Kanner MD, FACS Entered By: Evlyn Kanner on 05/12/2015 11:52:41 [name]  ([id number]) -------------------------------------------------------------------------------- Progress Note Details Patient Name: [name], [name] 05/12/2015 10:45 Date of Service: AM Medical Record [id number] Number: Patient Account Number: [id number] Date of Birth/Sex: 1965-12-24 (49 y.o. Female) Treating RN: Ashok Cordia, Debi Primary Care [facility], Other Clinician: Physician: French Ana Treating Kenzlie Disch, Verdia Kuba, Physician/Extender: Referring Physician: Delmer Islam in Treatment: 16 Subjective Chief Complaint Information obtained from Patient Right calf ulcer, status post I and D of abscess. History of Present Illness (HPI) Pleasant 50 year old with history of borderline personality disorder and CP. No history of diabetes or peripheral arterial disease. Right ABI 1.2. She developed an ulceration on her right anterior calf secondary to an insect bite. This progressed to an abscess, and she underwent I and D on 01/02/2015 by Dr. Michela Pitcher. Cultures grew oxacillin sensitive staph aureus. She completed a course of clindamycin. She lives in a group home. Punch biopsy 03/24/2015 showed no evidence for malignancy. Negative for granulomatous inflammation and vasculitis. Tolerating 3 layer compression bandage with silver alginate 3 times weekly. Epifix #1 applied 04/28/2015. Showing significant improvement recently. She returns to clinic for follow-up and is without new complaints. No significant pain. No fever or chills. Minimal drainage. 05/12/2015 -- here for the third application of a Epifix today. Objective Constitutional Pulse regular. Respirations normal and unlabored. Afebrile. Vitals Time Taken: 10:49 AM, Height: 64 in, Weight: 215 lbs, BMI: 36.9, Temperature: 97.9 F, Pulse: 76 bpm, Respiratory Rate: 18 breaths/min, Blood Pressure: 127/79 mmHg. Eyes Nonicteric. Reactive to light. [name], [name] ([id number]) Ears, Nose, Mouth, and Throat Lips, teeth, and gums  WNL.Marland Kitchen Moist mucosa without lesions. Neck supple and nontender. No palpable supraclavicular or cervical adenopathy. Normal sized without goiter. Respiratory WNL. No retractions.. Cardiovascular Pedal Pulses WNL. No clubbing, cyanosis or edema. Lymphatic No adneopathy. No adenopathy. No adenopathy. Musculoskeletal Adexa without tenderness or enlargement.. Digits and nails w/o clubbing, cyanosis, infection, petechiae, ischemia, or inflammatory conditions.Marland Kitchen Psychiatric Judgement and insight Intact.. No evidence of depression, anxiety, or agitation.. General Notes: the ulceration on the right calf looks excellent and is ready for the third application of a epifix today. Integumentary (Hair, Skin) No suspicious lesions. No crepitus or fluctuance. No peri-wound warmth or erythema. No masses.. Wound #1 status is Open. Original cause of wound was Bite. The wound is located on the Right,Medial Lower Leg. The wound measures 2cm length x 1.2cm width x 0.1cm depth; 1.885cm^2 area and 0.188cm^3 volume. The wound is limited to skin breakdown. There is no tunneling or undermining noted. There is a medium amount of serosanguineous drainage noted. The wound margin is flat and intact. There is large (67-100%) red granulation within the wound bed. There is no necrotic tissue within the wound bed. The periwound skin appearance exhibited: Localized Edema, Scarring, Moist. The periwound skin appearance did not exhibit: Callus, Crepitus, Excoriation, Fluctuance, Friable, Induration, Rash, Dry/Scaly, Maceration, Atrophie Blanche, Cyanosis, Ecchymosis, Hemosiderin Staining, Mottled, Pallor, Rubor, Erythema. Periwound temperature was noted as No Abnormality. Assessment Active Problems ICD-10 L97.212 - Non-pressure chronic ulcer of right calf with fat layer exposed S81.801S - Unspecified open wound, right lower leg, sequela [name], [name] ([id number]) F60.3 -  Borderline personality disorder After preparing  the wound bed in the usual fashion the third application of a prefix was applied with sterile technique and an appropriate dressing placed. She will have a Profore lite compression applied today. I have not reordered a Epifix for next week but will review her and then decide on the next application. Procedures Wound #1 Wound #1 is an Infection - not elsewhere classified located on the Right,Medial Lower Leg. A skin graft procedure using a bioengineered skin substitute/cellular or tissue based product was performed by Evlyn Kanner, MD. Epifix was applied and secured with Steri-Strips. 3 sq cm of product was utilized and 1 sq cm was wasted due to wound size. Post Application, mepitel was applied. A Time Out was conducted prior to the start of the procedure. The procedure was tolerated well with a pain level of 0 throughout and a pain level of 0 following the procedure. Post procedure Diagnosis Wound #1: Same as Pre-Procedure . Plan Wound Cleansing: Wound #1 Right,Medial Lower Leg: Cleanse wound with mild soap and water May shower with protection. Skin Barriers/Peri-Wound Care: Wound #1 Right,Medial Lower Leg: Moisturizing lotion Secondary Dressing: Wound #1 Right,Medial Lower Leg: Dry Gauze Dressing Change Frequency: Wound #1 Right,Medial Lower Leg: Change dressing every week - Do Not remove Follow-up Appointments: Wound #1 Right,Medial Lower Leg: Return Appointment in 1 week. Edema Control: Wound #1 Right,Medial Lower Leg: 3 Layer Compression System - Right Lower Extremity - use unna paste to anchor wrap at the top [name], [name] ([id number]) Home Health: Wound #1 Right,Medial Lower Leg: Continue Home Health Visits Home Health Nurse may visit PRN to address patient s wound care needs. FACE TO FACE ENCOUNTER: MEDICARE and MEDICAID PATIENTS: I certify that this patient is under my care and that I had a face-to-face encounter that meets the physician face-to-face  encounter requirements with this patient on this date. The encounter with the patient was in whole or in part for the following MEDICAL CONDITION: (primary reason for Home Healthcare) MEDICAL NECESSITY: I certify, that based on my findings, NURSING services are a medically necessary home health service. HOME BOUND STATUS: I certify that my clinical findings support that this patient is homebound (i.e., Due to illness or injury, pt requires aid of supportive devices such as crutches, cane, wheelchairs, walkers, the use of special transportation or the assistance of another person to leave their place of residence. There is a normal inability to leave the home and doing so requires considerable and taxing effort. Other absences are for medical reasons / religious services and are infrequent or of short duration when for other reasons). If current dressing causes regression in wound condition, may D/C ordered dressing product/s and apply Normal Saline Moist Dressing daily until next Wound Healing Center / Other MD appointment. Notify Wound Healing Center of regression in wound condition at [phone]. After preparing the wound bed in the usual fashion the third application of a prefix was applied with sterile technique and an appropriate dressing placed. She will have a Profore lite compression applied today. I have not reordered a Epifix for next week but will review her and then decide on the next application. Electronic Signature(s) Signed: 05/12/2015 4:17:41 PM By: Evlyn Kanner MD, FACS Previous Signature: 05/12/2015 11:55:33 AM Version By: Evlyn Kanner MD, FACS Entered By: Evlyn Kanner on 05/12/2015 16:17:41 [name] ([id number]) -------------------------------------------------------------------------------- SuperBill Details Patient Name: [name] Date of Service: 05/12/2015 Medical Record Number: [id number] Patient Account Number: [id number] Date of Birth/Sex:  [birth year] (49 y.o.  Female) Treating RN: Ashok Cordia, Debi Primary Care Physician: Everardo Pacific Other Clinician: Referring Physician: Everardo Pacific Treating Physician/Extender: Rudene Re in Treatment: 16 Diagnosis Coding ICD-10 Codes Code Description [phone] Non-pressure chronic ulcer of right calf with fat layer exposed S81.801S Unspecified open wound, right lower leg, sequela F60.3 Borderline personality disorder Facility Procedures CPT4 Code: [id number] Description: (Facility Use Only) Q4131 o Epifix o Per 1 SQ CM Modifier: Quantity: 2 CPT4 Code: [id number] Description: 15271 - SKIN SUB GRAFT TRNK/ARM/LEG ICD-10 Description Diagnosis L97.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 with fat l S81.801S Unspecified open wound, right lower leg, sequela F60.3 Borderline personality disorder Modifier: ayer exposed Quantity: 1 Physician Procedures CPT4 Code: [id number] Description: 15271 - WC PHYS SKIN SUB GRAFT TRNK/ARM/LEG ICD-10 Description Diagnosis L97.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 with fat l S81.801S Unspecified open wound, right lower leg, sequela F60.3 Borderline personality disorder Modifier: ayer exposed Quantity: 1 Electronic Signature(s) Signed: 05/12/2015 4:12:51 PM By: Evlyn Kanner MD, FACS Signed: 05/12/2015 5:42:28 PM By: Alejandro Mulling Previous Signature: 05/12/2015 11:55:47 AM Version By: Evlyn Kanner MD, FACS Entered By: Alejandro Mulling on 05/12/2015 13:55:04

## 2015-05-13 NOTE — Progress Notes (Signed)
PARALEE, PENDERGRASS ([id number]) Visit Report for 05/12/2015 Arrival Information Details Patient Name: [name], [name] 05/12/2015 10:45 Date of Service: AM Medical Record [id number] Number: Patient Account Number: [id number] Date of Birth/Sex: [birth year] (50 y.o. Female) Treating RN: Ashok Cordia, Debi Primary Care [facility]SC[facility]ZZ[facility], Other Clinician: Physician: French Ana Treating Britto, Verdia Kuba, Physician/Extender: Referring Physician: Delmer Islam in Treatment: 16 Visit Information History Since Last Visit All ordered tests and consults were completed: No Patient Arrived: Ambulatory Added or deleted any medications: No Arrival Time: 10:46 Any new allergies or adverse reactions: No Accompanied By: caregiver Had a fall or experienced change in No Transfer Assistance: None activities of daily living that may affect Patient Identification Verified: Yes risk of falls: Secondary Verification Process Yes Signs or symptoms of abuse/neglect since last No Completed: visito Patient Requires Transmission-Based No Hospitalized since last visit: No Precautions: Pain Present Now: No Patient Has Alerts: Yes Patient Alerts: aspirin Electronic Signature(s) Signed: 05/12/2015 5:42:28 PM By: Alejandro Mulling Entered By: Alejandro Mulling on 05/12/2015 10:49:34 [name] ([id number]) -------------------------------------------------------------------------------- Encounter Discharge Information Details Patient Name: [name], [name] 05/12/2015 10:45 Date of Service: AM Medical Record [id number] Number: Patient Account Number: [id number] Date of Birth/Sex: 03-27-1966 (50 y.o. Female) Treating RN: Ashok Cordia, Debi Primary Care [facility]SCOCOZZA, Other Clinician: Physician: French Ana Treating Britto, Verdia Kuba, Physician/Extender: Referring Physician: Delmer Islam in Treatment: 16 Encounter Discharge Information Items Discharge Pain Level: 0 Discharge Condition:  Stable Ambulatory Status: Ambulatory Discharge Destination: Home Transportation: Private Auto Accompanied By: caregiver Schedule Follow-up Appointment: Yes Medication Reconciliation completed and provided to Patient/Care Yes Anahy Esh: Provided on Clinical Summary of Care: 05/12/2015 Form Type Recipient Paper Patient [place] Electronic Signature(s) Signed: 05/12/2015 5:42:28 PM By: Alejandro Mulling Previous Signature: 05/12/2015 11:37:03 AM Version By: Gwenlyn Perking Entered By: Alejandro Mulling on 05/12/2015 11:44:18 [name] ([id number]) -------------------------------------------------------------------------------- Lower Extremity Assessment Details Patient Name: [name], [name] 05/12/2015 10:45 Date of Service: AM Medical Record [id number] Number: Patient Account Number: [id number] Date of Birth/Sex: 09/23/1965 (50 y.o. Female) Treating RN: Ashok Cordia, Debi Primary Care [facility]SCOCOZZA, Other Clinician: Physician: French Ana Treating Britto, Verdia Kuba, Physician/Extender: Referring Physician: Delmer Islam in Treatment: 16 Edema Assessment Assessed: [Left: No] [Right: No] E[Left: dema] [Right: :] Calf Left: Right: Point of Measurement: 28 cm From Medial Instep cm 36.5 cm Ankle Left: Right: Point of Measurement: 9 cm From Medial Instep cm 21.5 cm Vascular Assessment Pulses: Posterior Tibial Dorsalis Pedis Palpable: [Right:Yes] Extremity colors, hair growth, and conditions: Extremity Color: [Right:Normal] Temperature of Extremity: [Right:Warm] Capillary Refill: [Right:< 3 seconds] Toe Nail Assessment Left: Right: Thick: No Discolored: No Deformed: No Improper Length and Hygiene: No Electronic Signature(s) Signed: 05/12/2015 5:42:28 PM By: Alejandro Mulling Entered By: Alejandro Mulling on 05/12/2015 10:56:56 [name] ([id number]) Lucious Groves, [name]  ([id number]) -------------------------------------------------------------------------------- Multi Wound Chart Details Patient Name: [name], [name] 05/12/2015 10:45 Date of Service: AM Medical Record [id number] Number: Patient Account Number: [id number] Date of Birth/Sex: 27-Jul-1965 (49 y.o. Female) Treating RN: Ashok Cordia, Debi Primary Care [facility]SCOCOZZA, Other Clinician: Physician: French Ana Treating Britto, Verdia Kuba, Physician/Extender: Referring Physician: Delmer Islam in Treatment: 16 Vital Signs Height(in): 64 Pulse(bpm): 76 Weight(lbs): 215 Blood Pressure 127/79 (mmHg): Body Mass Index(BMI): 37 Temperature(F): 97.9 Respiratory Rate 18 (breaths/min): Photos: [1:No Photos] [N/A:N/A] Wound Location: [1:Right Lower Leg - Medial] [N/A:N/A] Wounding Event: [1:Bite] [N/A:N/A] Primary Etiology: [1:Infection - not elsewhere classified] [N/A:N/A] Date Acquired: [1:12/28/2014] [N/A:N/A] Weeks of Treatment: [1:16] [N/A:N/A] Wound Status: [1:Open] [N/A:N/A] Measurements L x W x D 2x1.2x0.1 [N/A:N/A] (cm) Area (cm) : [1:1.885] [N/A:N/A] Volume (cm) : [1:0.188] [N/A:N/A] % Reduction in Area: [1:68.00%] [N/A:N/A] %  Reduction in Volume: 68.10% [N/A:N/A] Classification: [1:Full Thickness Without Exposed Support Structures] [N/A:N/A] Exudate Amount: [1:Medium] [N/A:N/A] Exudate Type: [1:Serosanguineous] [N/A:N/A] Exudate Color: [1:red, brown] [N/A:N/A] Wound Margin: [1:Flat and Intact] [N/A:N/A] Granulation Amount: [1:Large (67-100%)] [N/A:N/A] Granulation Quality: [1:Red, Hyper-granulation] [N/A:N/A] Necrotic Amount: [1:None Present (0%)] [N/A:N/A] Exposed Structures: [1:Fascia: No Fat: No Tendon: No] [N/A:N/A] Muscle: No Joint: No Bone: No Limited to Skin Breakdown Epithelialization: Medium (34-66%) N/A N/A Periwound Skin Texture: Edema: Yes N/A N/A Scarring: Yes Excoriation: No Induration: No Callus: No Crepitus: No Fluctuance: No Friable:  No Rash: No Periwound Skin Moist: Yes N/A N/A Moisture: Maceration: No Dry/Scaly: No Periwound Skin Color: Atrophie Blanche: No N/A N/A Cyanosis: No Ecchymosis: No Erythema: No Hemosiderin Staining: No Mottled: No Pallor: No Rubor: No Temperature: No Abnormality N/A N/A Tenderness on No N/A N/A Palpation: Wound Preparation: Ulcer Cleansing: Other: N/A N/A soap and water Topical Anesthetic Applied: Other: lidocaine 4% Treatment Notes Electronic Signature(s) Signed: 05/12/2015 5:42:28 PM By: Alejandro Mulling Entered By: Alejandro Mulling on 05/12/2015 11:03:44 [name] ([id number]) -------------------------------------------------------------------------------- Multi-Disciplinary Care Plan Details Patient Name: [name], [name] 05/12/2015 10:45 Date of Service: AM Medical Record [id number] Number: Patient Account Number: [id number] Date of Birth/Sex: November 12, 1965 (49 y.o. Female) Treating RN: Ashok Cordia, Debi Primary Care [facility]LE[facility]SC[facility], Other Clinician: Physician: French Ana Treating Britto, Verdia Kuba, Physician/Extender: Referring Physician: Delmer Islam in Treatment: 56 Active Inactive Orientation to the Wound Care Program Nursing Diagnoses: Knowledge deficit related to the wound healing center program Goals: Patient/caregiver will verbalize understanding of the Wound Healing Center Program Date Initiated: 01/20/2015 Goal Status: Active Interventions: Provide education on orientation to the wound center Notes: Soft Tissue Infection Nursing Diagnoses: Potential for infection: soft tissue Goals: Patient will remain free of wound infection Date Initiated: 01/20/2015 Goal Status: Active Interventions: Assess signs and symptoms of infection every visit Treatment Activities: Culture and sensitivity : 05/12/2015 Notes: Wound/Skin Impairment [name], [name] ([id number]) Nursing Diagnoses: Impaired tissue integrity Goals: Ulcer/skin breakdown  will heal within 14 weeks Date Initiated: 01/20/2015 Goal Status: Active Interventions: Assess patient/caregiver ability to obtain necessary supplies Notes: Electronic Signature(s) Signed: 05/12/2015 5:42:28 PM By: Alejandro Mulling Entered By: Alejandro Mulling on 05/12/2015 11:03:35 [name] ([id number]) -------------------------------------------------------------------------------- Pain Assessment Details Patient Name: [name], [name] 05/12/2015 10:45 Date of Service: AM Medical Record [id number] Number: Patient Account Number: [id number] Date of Birth/Sex: Feb 16, 1966 (50 y.o. Female) Treating RN: Ashok Cordia, Debi Primary Care MCLE[facility]-SCOCOZZA, Other Clinician: Physician: French Ana Treating Britto, Verdia Kuba, Physician/Extender: Referring Physician: Delmer Islam in Treatment: 16 Active Problems Location of Pain Severity and Description of Pain Patient Has Paino No Site Locations Pain Management and Medication Current Pain Management: Electronic Signature(s) Signed: 05/12/2015 5:42:28 PM By: Alejandro Mulling Entered By: Alejandro Mulling on 05/12/2015 10:49:41 [name] ([id number]) -------------------------------------------------------------------------------- Patient/Caregiver Education Details Patient Name: [name], [name] 05/12/2015 10:45 Date of Service: AM Medical Record [id number] Number: Patient Account Number: [id number] Date of Birth/Gender: Aug 02, 1965 (49 y.o. Female) Treating RN: Ashok Cordia, Debi Primary Care [facility]LE[facility]-SCOCOZZA, Other Clinician: Physician: French Ana Treating Britto, Verdia Kuba, Physician/Extender: Referring Physician: Delmer Islam in Treatment: 16 Education Assessment Education Provided To: Patient and Caregiver Education Topics Provided Wound/Skin Impairment: Handouts: Other: do get get wrap wet Methods: Demonstration, Explain/Verbal Responses: State content correctly Electronic Signature(s) Signed:  05/12/2015 5:42:28 PM By: Alejandro Mulling Entered By: Alejandro Mulling on 05/12/2015 11:44:36 [name] ([id number]) -------------------------------------------------------------------------------- Wound Assessment Details Patient Name: [name], [name] 05/12/2015 10:45 Date of Service: AM Medical Record [id number] Number: Patient Account Number: [id number] Date of Birth/Sex: 1965/12/01 (50 y.o. Female) Treating RN: Ashok Cordia, Debi Primary Care [facility]LE[facility]-SCOCO[facility]A,  Other Clinician: Physician: French Ana Treating Britto, Verdia Kuba, Physician/Extender: Referring Physician: Delmer Islam in Treatment: 16 Wound Status Wound Number: 1 Primary Etiology: Infection - not elsewhere classified Wound Location: Right Lower Leg - Medial Wound Status: Open Wounding Event: Bite Date Acquired: 12/28/2014 Weeks Of Treatment: 16 Clustered Wound: No Photos Photo Uploaded By: Alejandro Mulling on 05/12/2015 17:33:38 Wound Measurements Length: (cm) 2 Width: (cm) 1.2 Depth: (cm) 0.1 Area: (cm) 1.885 Volume: (cm) 0.188 % Reduction in Area: 68% % Reduction in Volume: 68.1% Epithelialization: Medium (34-66%) Tunneling: No Undermining: No Wound Description Full Thickness Without Exposed Classification: Support Structures Wound Margin: Flat and Intact Exudate Medium Amount: Exudate Type: Serosanguineous Exudate Color: red, brown Foul Odor After Cleansing: No Wound Bed [name], [name] ([id number]) Granulation Amount: Large (67-100%) Exposed Structure Granulation Quality: Red, Hyper-granulation Fascia Exposed: No Necrotic Amount: None Present (0%) Fat Layer Exposed: No Tendon Exposed: No Muscle Exposed: No Joint Exposed: No Bone Exposed: No Limited to Skin Breakdown Periwound Skin Texture Texture Color No Abnormalities Noted: No No Abnormalities Noted: No Callus: No Atrophie Blanche: No Crepitus: No Cyanosis: No Excoriation: No Ecchymosis: No Fluctuance:  No Erythema: No Friable: No Hemosiderin Staining: No Induration: No Mottled: No Localized Edema: Yes Pallor: No Rash: No Rubor: No Scarring: Yes Temperature / Pain Moisture Temperature: No Abnormality No Abnormalities Noted: No Dry / Scaly: No Maceration: No Moist: Yes Wound Preparation Ulcer Cleansing: Other: soap and water, Topical Anesthetic Applied: Other: lidocaine 4%, Treatment Notes Wound #1 (Right, Medial Lower Leg) 1. Cleansed with: Cleanse wound with antibacterial soap and water 2. Anesthetic Topical Lidocaine 4% cream to wound bed prior to debridement 3. Peri-wound Care: Skin Prep 5. Secondary Dressing Applied ABD Pad Dry Gauze 7. Secured with 3 Layer Compression System - Right Lower Extremity Notes epifix applied today by Dr Meyer Russel, Mepitel one, ster-strips [place], [place] ([id number]) Electronic Signature(s) Signed: 05/12/2015 5:42:28 PM By: Alejandro Mulling Entered By: Alejandro Mulling on 05/12/2015 11:01:08 [name] ([id number]) -------------------------------------------------------------------------------- Vitals Details Patient Name: [name], [name] 05/12/2015 10:45 Date of Service: AM Medical Record [id number] Number: Patient Account Number: [id number] Date of Birth/Sex: 1965-06-21 (49 y.o. Female) Treating RN: Ashok Cordia, Debi Primary Care MCLE[facility]-SCOCOZZA, Other Clinician: Physician: French Ana Treating Britto, Verdia Kuba, Physician/Extender: Referring Physician: Delmer Islam in Treatment: 16 Vital Signs Time Taken: 10:49 Temperature (F): 97.9 Height (in): 64 Pulse (bpm): 76 Weight (lbs): 215 Respiratory Rate (breaths/min): 18 Body Mass Index (BMI): 36.9 Blood Pressure (mmHg): 127/79 Reference Range: 80 - 120 mg / dl Electronic Signature(s) Signed: 05/12/2015 5:42:28 PM By: Alejandro Mulling Entered By: Alejandro Mulling on 05/12/2015 10:50:08

## 2015-05-14 ENCOUNTER — Encounter: Payer: Self-pay | Admitting: *Deleted

## 2015-05-14 ENCOUNTER — Emergency Department
Admission: EM | Admit: 2015-05-14 | Discharge: 2015-05-16 | Disposition: A | Discharge status: Other Acute Inpatient Hospital | Payer: Medicare Other | Attending: Emergency Medicine | Admitting: Emergency Medicine

## 2015-05-14 DIAGNOSIS — Z880 Allergy status to penicillin: Secondary | ICD-10-CM | POA: Diagnosis not present

## 2015-05-14 DIAGNOSIS — Z3202 Encounter for pregnancy test, result negative: Secondary | ICD-10-CM | POA: Diagnosis not present

## 2015-05-14 DIAGNOSIS — Z792 Long term (current) use of antibiotics: Secondary | ICD-10-CM | POA: Diagnosis not present

## 2015-05-14 DIAGNOSIS — Z791 Long term (current) use of non-steroidal anti-inflammatories (NSAID): Secondary | ICD-10-CM | POA: Diagnosis not present

## 2015-05-14 DIAGNOSIS — F329 Major depressive disorder, single episode, unspecified: Secondary | ICD-10-CM | POA: Diagnosis not present

## 2015-05-14 DIAGNOSIS — F1310 Sedative, hypnotic or anxiolytic abuse, uncomplicated: Secondary | ICD-10-CM | POA: Diagnosis not present

## 2015-05-14 DIAGNOSIS — F70 Mild intellectual disabilities: Secondary | ICD-10-CM | POA: Insufficient documentation

## 2015-05-14 DIAGNOSIS — R45851 Suicidal ideations: Secondary | ICD-10-CM | POA: Diagnosis present

## 2015-05-14 DIAGNOSIS — F32A Depression, unspecified: Secondary | ICD-10-CM

## 2015-05-14 DIAGNOSIS — Z7951 Long term (current) use of inhaled steroids: Secondary | ICD-10-CM | POA: Diagnosis not present

## 2015-05-14 DIAGNOSIS — Z79899 Other long term (current) drug therapy: Secondary | ICD-10-CM | POA: Insufficient documentation

## 2015-05-14 LAB — URINE DRUG SCREEN, QUALITATIVE (ARMC ONLY)
Amphetamines, Ur Screen: NOT DETECTED
Barbiturates, Ur Screen: NOT DETECTED
Benzodiazepine, Ur Scrn: POSITIVE — AB
COCAINE METABOLITE, UR ~~LOC~~: NOT DETECTED
Cannabinoid 50 Ng, Ur ~~LOC~~: NOT DETECTED
MDMA (ECSTASY) UR SCREEN: NOT DETECTED
METHADONE SCREEN, URINE: NOT DETECTED
OPIATE, UR SCREEN: NOT DETECTED
Phencyclidine (PCP) Ur S: NOT DETECTED
TRICYCLIC, UR SCREEN: POSITIVE — AB

## 2015-05-14 LAB — POCT PREGNANCY, URINE: Preg Test, Ur: NEGATIVE

## 2015-05-14 LAB — COMPREHENSIVE METABOLIC PANEL
ALBUMIN: 4.3 g/dL (ref 3.5–5.0)
ALT: 20 U/L (ref 14–54)
AST: 22 U/L (ref 15–41)
Alkaline Phosphatase: 49 U/L (ref 38–126)
Anion gap: 6 (ref 5–15)
BILIRUBIN TOTAL: 0.4 mg/dL (ref 0.3–1.2)
BUN: 17 mg/dL (ref 6–20)
CO2: 31 mmol/L (ref 22–32)
CREATININE: 0.88 mg/dL (ref 0.44–1.00)
Calcium: 9.6 mg/dL (ref 8.9–10.3)
Chloride: 105 mmol/L (ref 101–111)
GFR calc Af Amer: >60 mL/min (ref >60)
GLUCOSE: 102 mg/dL — AB (ref 65–99)
POTASSIUM: 3.5 mmol/L (ref 3.5–5.1)
Sodium: 142 mmol/L (ref 135–145)
TOTAL PROTEIN: 7.6 g/dL (ref 6.5–8.1)

## 2015-05-14 LAB — CBC WITH DIFFERENTIAL/PLATELET
Basophils Absolute: 0.1 10*3/uL (ref 0–0.1)
Basophils Relative: 1 %
Eosinophils Absolute: 0.2 10*3/uL (ref 0–0.7)
Eosinophils Relative: 3 %
HEMATOCRIT: 36.7 % (ref 35.0–47.0)
HEMOGLOBIN: 12.4 g/dL (ref 12.0–16.0)
LYMPHS ABS: 2.2 10*3/uL (ref 1.0–3.6)
Lymphocytes Relative: 29 %
MCH: 30.2 pg (ref 26.0–34.0)
MCHC: 33.7 g/dL (ref 32.0–36.0)
MCV: 89.7 fL (ref 80.0–100.0)
MONO ABS: 0.7 10*3/uL (ref 0.2–0.9)
MONOS PCT: 9 %
NEUTROS ABS: 4.5 10*3/uL (ref 1.4–6.5)
NEUTROS PCT: 58 %
Platelets: 296 10*3/uL (ref 150–440)
RBC: 4.09 MIL/uL (ref 3.80–5.20)
RDW: 13.7 % (ref 11.5–14.5)
WBC: 7.7 10*3/uL (ref 3.6–11.0)

## 2015-05-14 LAB — ACETAMINOPHEN LEVEL: Acetaminophen (Tylenol), Serum: <10 ug/mL — ABNORMAL LOW (ref 10–30)

## 2015-05-14 LAB — ETHANOL: Alcohol, Ethyl (B): <5 mg/dL (ref <5)

## 2015-05-14 LAB — SALICYLATE LEVEL: Salicylate Lvl: <4 mg/dL (ref 2.8–30.0)

## 2015-05-14 MED ORDER — ESCITALOPRAM OXALATE 10 MG PO TABS
20.0000 mg | ORAL_TABLET | Freq: Every day | ORAL | Status: DC
  Administered 2015-05-14 – 2015-05-15 (×2): 20 mg via ORAL
  Filled 2015-05-14 (×3): qty 2

## 2015-05-14 MED ORDER — ARIPIPRAZOLE 10 MG PO TABS
10.0000 mg | ORAL_TABLET | Freq: Every day | ORAL | Status: DC
  Administered 2015-05-15 – 2015-05-16 (×2): 10 mg via ORAL
  Filled 2015-05-14 (×2): qty 1

## 2015-05-14 MED ORDER — FENOFIBRATE 54 MG PO TABS
54.0000 mg | ORAL_TABLET | Freq: Every day | ORAL | Status: DC
  Administered 2015-05-15 – 2015-05-16 (×2): 54 mg via ORAL
  Filled 2015-05-14 (×3): qty 1

## 2015-05-14 MED ORDER — MELOXICAM 15 MG PO TABS
15.0000 mg | ORAL_TABLET | Freq: Every day | ORAL | Status: DC
  Administered 2015-05-15 – 2015-05-16 (×2): 15 mg via ORAL
  Filled 2015-05-14 (×2): qty 1

## 2015-05-14 MED ORDER — PANTOPRAZOLE SODIUM 40 MG PO TBEC
40.0000 mg | DELAYED_RELEASE_TABLET | Freq: Every day | ORAL | Status: DC
  Administered 2015-05-15 – 2015-05-16 (×2): 40 mg via ORAL
  Filled 2015-05-14 (×2): qty 1

## 2015-05-14 MED ORDER — MOMETASONE FURO-FORMOTEROL FUM 100-5 MCG/ACT IN AERO
2.0000 | INHALATION_SPRAY | Freq: Two times a day (BID) | RESPIRATORY_TRACT | Status: DC
  Administered 2015-05-15 – 2015-05-16 (×3): 2 via RESPIRATORY_TRACT
  Filled 2015-05-14: qty 8.8

## 2015-05-14 NOTE — ED Notes (Signed)
Pt. States "I want to get hit by a car, I don't care who gets hurt".

## 2015-05-14 NOTE — ED Provider Notes (Signed)
[facility] Emergency Department Provider Note  ____________________________________________  Time seen: Approximately 11:36 PM  I have reviewed the triage vital signs and the nursing notes.   HISTORY  Chief Complaint Suicidal  History limited by chronic psychiatric illness   HPI [name] is a 50 y.o. female with chronic psychiatric disorder who lives at a group home and has frequent emergency department visits.  She does have prior history of suicide attempts.  She presents under involuntary commitment because she is going through some social problems at the group home apparently attempting to change from one home to another and she now reports wanting to die and has plans to step in front of a car.  She states that she does not care who else gets hurt she just does not want to live.  She denies any medical complaints or concerns at this time, specifically denying headache, shortness of breath, chest pain, abdominal pain, nausea/vomiting, and dysuria.  She reports her symptoms are severe.  Nothing is making her feel better and everything makes her feel worse.  The onset of her feelings of suicidality was relatively acute after the issues with the group home came up today.   Past Medical History  Diagnosis Date  . Asthma   . Anxiety   . GERD (gastroesophageal reflux disease)   . Mild cognitive impairment   . Cerebral palsy (HCC)   . Borderline personality disorder   . Depression   . Wound abscess   . Schizoaffective disorder [facility])     Patient Active Problem List   Diagnosis Date Noted  . Anxiety 12/31/2014  . Cellulitis and abscess of leg 12/31/2014  . 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 GERD (gastroesophageal reflux disease) 09/24/2014  . COPD (chronic obstructive pulmonary disease) (HCC) 09/24/2014  . Borderline personality disorder 09/24/2014  . Major depressive disorder, recurrent episode, moderate (HCC) 09/24/2014  . Cerebral palsy  (HCC) 08/31/2014    Past Surgical History  Procedure Laterality Date  . Cholecystectomy    . Tonsillectomy    . Incision and drainage abscess Right 01/02/2015    Procedure: INCISION AND DRAINAGE ABSCESS;  Surgeon: Tiney Rouge III, MD;  Location: [facility] ORS;  Service: General;  Laterality: Right;    Current Outpatient Rx  Name  Route  Sig  Dispense  Refill  . acetaminophen (TYLENOL) 500 MG tablet   Oral   Take 500 mg by mouth every 4 (four) hours as needed for mild pain, moderate pain, fever or headache.         . ARIPiprazole (ABILIFY) 10 MG tablet   Oral   Take 1 tablet (10 mg total) by mouth daily.   30 tablet   0   . azithromycin (ZITHROMAX Z-PAK) 250 MG tablet      Take 2 tablets (500 mg) on  Day 1,  followed by 1 tablet (250 mg) once daily on Days 2 through 5.   6 each   0   . diphenoxylate-atropine (LOMOTIL) 2.5-0.025 MG per tablet   Oral   Take 1 tablet by mouth every 6 (six) hours as needed for diarrhea or loose stools.         Marland Kitchen escitalopram (LEXAPRO) 20 MG tablet   Oral   Take 1 tablet (20 mg total) by mouth at bedtime.   30 tablet   0   . fenofibrate 54 MG tablet   Oral   Take 54 mg by mouth daily.         Marland Kitchen  Fluticasone-Salmeterol (ADVAIR) 100-50 MCG/DOSE AEPB   Inhalation   Inhale 1 puff into the lungs every 12 (twelve) hours.          Marland Kitchen guaiFENesin-codeine 100-10 MG/5ML syrup   Oral   Take 10 mLs by mouth every 4 (four) hours as needed for cough.   180 mL   0   . ibuprofen (ADVIL,MOTRIN) 800 MG tablet   Oral   Take 1 tablet (800 mg total) by mouth every 8 (eight) hours as needed.   30 tablet   0   . ketoconazole (NIZORAL) 2 % cream   Topical   Apply 1 application topically 2 (two) times daily as needed for irritation. To breast and urinal areas         . meloxicam (MOBIC) 15 MG tablet   Oral   Take 15 mg by mouth daily.         . miconazole (ZEASORB-AF) 2 % powder   Topical   Apply 1 application topically 2 (two) times daily.          . pantoprazole (PROTONIX) 40 MG tablet   Oral   Take 40 mg by mouth daily.         . traMADol (ULTRAM) 50 MG tablet   Oral   Take 50 mg by mouth 2 (two) times daily as needed for moderate pain or severe pain.           Allergies Penicillins  Family History  Problem Relation Age of Onset  . Diabetes Mother   . Heart attack Father     Social History Social History  Substance Use Topics  . Smoking status: Never Smoker   . Smokeless tobacco: Never Used  . Alcohol Use: No    Review of Systems Constitutional: No fever/chills Eyes: No visual changes. ENT: No sore throat. Cardiovascular: Denies chest pain. Respiratory: Denies shortness of breath. Gastrointestinal: No abdominal pain.  No nausea, no vomiting.  No diarrhea.  No constipation. Genitourinary: Negative for dysuria. Musculoskeletal: Negative for back pain. Skin: Negative for rash. Neurological: Negative for headaches, focal weakness or numbness. Psychiatric:  Suicidal ideation with a plan to step in front of a car, commenting that she does not care who else concert  10-point ROS otherwise negative.  ____________________________________________   PHYSICAL EXAM:  VITAL SIGNS: ED Triage Vitals  Enc Vitals Group     BP 05/14/15 2042 143/81 mmHg     Pulse Rate 05/14/15 2042 83     Resp 05/14/15 2042 20     Temp 05/14/15 2042 98.3 F (36.8 C)     Temp Source 05/14/15 2042 Oral     SpO2 05/14/15 2042 97 %     Weight --      Height 05/14/15 2042  (1.626 m)     Head Cir --      Peak Flow --      Pain Score 05/14/15 2046 0     Pain Loc --      Pain Edu? --      Excl. in GC? --     Constitutional: Alert and oriented.  Mildly anxious.  Appears to be at her baseline. Eyes: Conjunctivae are normal. PERRL. EOMI. Head: Atraumatic. Nose: No congestion/rhinnorhea. Mouth/Throat: Mucous membranes are moist.  Oropharynx non-erythematous. Neck: No stridor.   Cardiovascular: Normal rate, regular  rhythm. Grossly normal heart sounds.  Good peripheral circulation. Respiratory: Normal respiratory effort.  No retractions. Lungs CTAB. Gastrointestinal: Soft and nontender. No distention. No abdominal bruits.  No CVA tenderness. Musculoskeletal: No lower extremity tenderness nor edema.  No joint effusions. Neurologic:  Normal speech and language. No gross focal neurologic deficits are appreciated.  Skin:  Skin is warm, dry and intact. No rash noted. Psychiatric: Reports sidle ideation with intent to step in front of traffic and states that she does not care who else gets hurt.  ____________________________________________   LABS (all labs ordered are listed, but only abnormal results are displayed)  Labs Reviewed  COMPREHENSIVE METABOLIC PANEL - Abnormal; Notable for the following:    Glucose, Bld 102 (*)    All other components within normal limits  ACETAMINOPHEN LEVEL - Abnormal; Notable for the following:    Acetaminophen (Tylenol), Serum <10 (*)    All other components within normal limits  URINE DRUG SCREEN, QUALITATIVE ([facility] ONLY) - Abnormal; Notable for the following:    Tricyclic, Ur Screen POSITIVE (*)    Benzodiazepine, Ur Scrn POSITIVE (*)    All other components within normal limits  ETHANOL  SALICYLATE LEVEL  CBC WITH DIFFERENTIAL/PLATELET  POC URINE PREG, ED  POCT PREGNANCY, URINE   ____________________________________________  EKG  None ____________________________________________  RADIOLOGY   No results found.  ____________________________________________   PROCEDURES  Procedure(s) performed: None  Critical Care performed: No ____________________________________________   INITIAL IMPRESSION / ASSESSMENT AND PLAN / ED COURSE  Pertinent labs & imaging results that were available during my care of the patient were reviewed by me and considered in my medical decision making (see chart for details).  Suicidal ideation, long psych history, history  of prior suicide attempts, lives at a group home.  I will uphold involuntary commitment at this time in order psych consult.  She is medically cleared and appropriate for the [facility] while awaiting psychiatric disposition.  I have ordered her regular scheduled medications during her emergency department stay.  ____________________________________________  FINAL CLINICAL IMPRESSION(S) / ED DIAGNOSES  Final diagnoses:  Depression  Suicidal ideation  Mild intellectual disability      NEW MEDICATIONS STARTED DURING THIS VISIT:  New Prescriptions   No medications on file     Loleta Rose, MD 05/14/15 2340

## 2015-05-14 NOTE — ED Notes (Addendum)
Pt states she is "suicidal" and had plan to "get hit by a car". Pt states past hx of SI w/ attempt when she was a teenager through OD. Pt is not under IVC at this time, but is in the company of [employer]. Pt was to move out of her group home today, states she wanted "a better place to live". Pt states she had all of her stuff packed. Pt states a she was told she could not move out today and states this is why she is suicidal "because nobody wants to help me".

## 2015-05-15 DIAGNOSIS — F329 Major depressive disorder, single episode, unspecified: Secondary | ICD-10-CM | POA: Diagnosis not present

## 2015-05-15 MED ORDER — IBUPROFEN 600 MG PO TABS
600.0000 mg | ORAL_TABLET | Freq: Once | ORAL | Status: AC
  Administered 2015-05-15: 600 mg via ORAL
  Filled 2015-05-15: qty 1

## 2015-05-15 MED ORDER — ESCITALOPRAM OXALATE 10 MG PO TABS
ORAL_TABLET | ORAL | Status: AC
  Administered 2015-05-15: 20 mg via ORAL
  Filled 2015-05-15: qty 1

## 2015-05-15 NOTE — ED Notes (Signed)
BEHAVIORAL HEALTH ROUNDING  Patient sleeping: No.  Patient alert and oriented: yes  Behavior appropriate: Yes. ; If no, describe:  Nutrition and fluids offered: Yes  Toileting and hygiene offered: Yes  Sitter present: not applicable, Q 15 min safety rounds and observation via security camera. Law enforcement present: Yes ODS  

## 2015-05-15 NOTE — ED Notes (Signed)
BEHAVIORAL HEALTH ROUNDING Patient sleeping: Yes.   Patient alert and oriented: not applicable SLEEPING Behavior appropriate: Yes.  ; If no, describe: SLEEPING Nutrition and fluids offered: No SLEEPING Toileting and hygiene offered: NoSLEEPING Sitter present: not applicable, Q 15 min safety rounds and observation via security camera. Law enforcement present: Yes ODS 

## 2015-05-15 NOTE — ED Notes (Signed)
ENVIRONMENTAL ASSESSMENT  Potentially harmful objects out of patient reach: Yes.  Personal belongings secured: Yes.  Patient dressed in hospital provided attire only: Yes.  Plastic bags out of patient reach: Yes.  Patient care equipment (cords, cables, call bells, lines, and drains) shortened, removed, or accounted for: Yes.  Equipment and supplies removed from bottom of stretcher: Yes.  Potentially toxic materials out of patient reach: Yes.  Sharps container removed or out of patient reach: Yes.   BEHAVIORAL HEALTH ROUNDING  Patient sleeping: No.  Patient alert and oriented: yes  Behavior appropriate: Yes. ; If no, describe:  Nutrition and fluids offered: Yes  Toileting and hygiene offered: Yes  Sitter present: not applicable, Q 15 min safety rounds and observation via security camera. Law enforcement present: Yes ODS  

## 2015-05-15 NOTE — ED Notes (Signed)
ED BHU PLACEMENT JUSTIFICATION  Is the patient under IVC or is there intent for IVC: Yes.  Is the patient medically cleared: Yes.  Is there vacancy in the ED BHU: Yes.  Is the population mix appropriate for patient: Yes.  Is the patient awaiting placement in inpatient or outpatient setting: Yes.  Has the patient had a psychiatric consult: No Survey of unit performed for contraband, proper placement and condition of furniture, tampering with fixtures in bathroom, shower, and each patient room: Yes. ; Findings: All clear  APPEARANCE/BEHAVIOR  calm, cooperative and adequate rapport can be established  NEURO ASSESSMENT  Orientation: time, place and person  Hallucinations: No.None noted (Hallucinations)  Speech: Normal  Gait: normal  RESPIRATORY ASSESSMENT  WNL  CARDIOVASCULAR ASSESSMENT  WNL  GASTROINTESTINAL ASSESSMENT  WNL  EXTREMITIES  WNL  PLAN OF CARE  Provide calm/safe environment. Vital signs assessed twice daily. ED BHU Assessment once each 12-hour shift. Collaborate with intake RN daily or as condition indicates. Assure the ED provider has rounded once each shift. Provide and encourage hygiene. Provide redirection as needed. Assess for escalating behavior; address immediately and inform ED provider.  Assess family dynamic and appropriateness for visitation as needed: Yes. ; If necessary, describe findings:  Educate the patient/family about BHU procedures/visitation: Yes. ; If necessary, describe findings: Pt is calm and cooperative at this time. Pt still voicing SI but contracts for safety with this RN and is encouraged to talk about her thoughts and feelings as needed.  Pt understanding and accepting of unit procedures/rules. Will continue to monitor with Q 15 min safety rounds and observation via security camera.

## 2015-05-15 NOTE — BH Assessment (Addendum)
Writer seen patient to complete re-assessment for an updated mental and emotional status. She states she been working on moving out of her current Group Home. On yesterday, the new group home, [facility]) came and visited her. Patient was told by the new Group Home, her money was going to a former Group Home. Thus, she wasn't able to move on yesterday. Patient states, she became suicidal and have the plan to walk in traffic.  Patient has frequent the ER due to similar presentation. This is her fourth ER visit this month for the same. She states, she want to move from the Group Home due to "the neighborhood is bad." She states, she gets alone with the staff and the other residents.

## 2015-05-15 NOTE — ED Notes (Signed)
Dinner served

## 2015-05-15 NOTE — ED Notes (Signed)
Pt c/o of cough and vauge chest ache , skin warm and dry in no distress ,is asking for a chest x ray , I have noted no cough

## 2015-05-15 NOTE — BH Assessment (Signed)
Assessment Note  [name] is a 50 y.o. female presenting to the ED under IVC for suicidal ideations with a plan to run out in front of traffic.  Pt was previously here at the ED for the same.  She continues to report that she is unhappy at her group home desires to leave.  Tonight she is reporting she  wants to die and has plans to step in front of a car. She states that she does not care who else gets hurt she just does not want to live.  She denies any A/V hallucinations.  Pt also denies any drug/alcohol use.  Diagnosis: Suicidal  Past Medical History:  Past Medical History  Diagnosis Date  . Asthma   . Anxiety   . GERD (gastroesophageal reflux disease)   . Mild cognitive impairment   . Cerebral palsy (HCC)   . Borderline personality disorder   . Depression   . Wound abscess   . Schizoaffective disorder [facility])     Past Surgical History  Procedure Laterality Date  . Cholecystectomy    . Tonsillectomy    . Incision and drainage abscess Right 01/02/2015    Procedure: INCISION AND DRAINAGE ABSCESS;  Surgeon: Tiney Rouge III, MD;  Location: [facility] ORS;  Service: General;  Laterality: Right;    Family History:  Family History  Problem Relation Age of Onset  . Diabetes Mother   . Heart attack Father     Social History:  reports that she has never smoked. She has never used smokeless tobacco. She reports that she does not drink alcohol or use illicit drugs.  Additional Social History:  Alcohol / Drug Use History of alcohol / drug use?: No history of alcohol / drug abuse  CIWA: CIWA-Ar BP: (!) 143/81 mmHg Pulse Rate: 83 COWS:    Allergies:  Allergies  Allergen Reactions  . Penicillins Other (See Comments)    Pt states that she is "just scared to take it".      Home Medications:  (Not in a hospital admission)  OB/GYN Status:  No LMP recorded. Patient has had an implant.  General Assessment Data Location of Assessment: [facility] ED TTS Assessment: In system Is this  a Tele or Face-to-Face Assessment?: Face-to-Face Is this an Initial Assessment or a Re-assessment for this encounter?: Initial Assessment Marital status: Single Maiden name: N/A Is patient pregnant?: No Pregnancy Status: No Living Arrangements: Group Home Can pt return to current living arrangement?: Yes Admission Status: Involuntary Is patient capable of signing voluntary admission?: Yes Referral Source: Self/Family/Friend Insurance type: Medicare  Medical Screening Exam [facility] Walk-in ONLY) Medical Exam completed: Yes  Crisis Care Plan Living Arrangements: Group Home Legal Guardian: Other: (self) Name of Psychiatrist: Dr.Headen Name of Therapist: None  Education Status Is patient currently in school?: No Current Grade: N/A Highest grade of school patient has completed: 10th Grade Name of school: n/a Contact person: n/a  Risk to self with the past 6 months Suicidal Ideation: Yes-Currently Present Has patient been a risk to self within the past 6 months prior to admission? : No Suicidal Intent: Yes-Currently Present Has patient had any suicidal intent within the past 6 months prior to admission? : No Is patient at risk for suicide?: Yes Suicidal Plan?: Yes-Currently Present Has patient had any suicidal plan within the past 6 months prior to admission? : No Specify Current Suicidal Plan: Pt reports she will walk out in front of traffic Access to Means: Yes Specify Access to Suicidal Means: Access to  traffic What has been your use of drugs/alcohol within the last 12 months?: None reported Previous Attempts/Gestures: Yes How many times?: 2 Other Self Harm Risks: Don't like group home Triggers for Past Attempts: Other (Comment) Intentional Self Injurious Behavior: None Family Suicide History: No Recent stressful life event(s): Other (Comment) (Pt does not like her group home) Persecutory voices/beliefs?: No Depression: Yes Depression Symptoms: Loss of interest in usual  pleasures Substance abuse history and/or treatment for substance abuse?: No Suicide prevention information given to non-admitted patients: Not applicable  Risk to Others within the past 6 months Homicidal Ideation: No Does patient have any lifetime risk of violence toward others beyond the six months prior to admission? : No Thoughts of Harm to Others: No Current Homicidal Intent: No Current Homicidal Plan: No Access to Homicidal Means: No Identified Victim: None identified History of harm to others?: No Assessment of Violence: None Noted Violent Behavior Description: None identified Does patient have access to weapons?: No Criminal Charges Pending?: No Does patient have a court date: No Is patient on probation?: No  Psychosis Hallucinations: None noted Delusions: None noted  Mental Status Report Appearance/Hygiene: Unremarkable, In scrubs, In hospital gown Eye Contact: Good Motor Activity: Freedom of movement, Agitation Speech: Logical/coherent, Slow Level of Consciousness: Alert Mood: Depressed, Anxious Affect: Appropriate to circumstance, Sad Anxiety Level: Minimal Thought Processes: Relevant, Coherent Judgement: Partial Orientation: Person, Place, Time, Situation, Appropriate for developmental age Obsessive Compulsive Thoughts/Behaviors: None  Cognitive Functioning Concentration: Normal Memory: Recent Intact, Remote Intact IQ: Average Insight: Poor Impulse Control: Poor Appetite: Good Weight Loss: 0 Weight Gain: 0 Sleep: No Change Total Hours of Sleep: 4 Vegetative Symptoms: None  ADLScreening [facility] Assessment Services) Patient's cognitive ability adequate to safely complete daily activities?: Yes Patient able to express need for assistance with ADLs?: Yes Independently performs ADLs?: Yes (appropriate for developmental age)  Prior Inpatient Therapy Prior Inpatient Therapy: Yes Prior Therapy Dates: Multiple Prior Therapy Facilty/Provider(s): [facility],  [facility] Reason for Treatment: Depression, SI  Prior Outpatient Therapy Prior Outpatient Therapy: Yes Prior Therapy Dates: 2016 Prior Therapy Facilty/Provider(s): [facility] Reason for Treatment: Depression, SI Does patient have an ACCT team?: No Does patient have Intensive In-House Services?  : No Does patient have [facility] services? : No Does patient have P4CC services?: No  ADL Screening (condition at time of admission) Patient's cognitive ability adequate to safely complete daily activities?: Yes Patient able to express need for assistance with ADLs?: Yes Independently performs ADLs?: Yes (appropriate for developmental age)       Abuse/Neglect Assessment (Assessment to be complete while patient is alone) Physical Abuse: Denies Verbal Abuse: Denies Sexual Abuse: Denies Exploitation of patient/patient's resources: Denies Self-Neglect: Denies Values / Beliefs Cultural Requests During Hospitalization: None Spiritual Requests During Hospitalization: None Consults Spiritual Care Consult Needed: No Social Work Consult Needed: No Merchant navy officer (For Healthcare) Does patient have an advance directive?: No Would patient like information on creating an advanced directive?: No - patient declined information    Additional Information 1:1 In Past 12 Months?: No CIRT Risk: No Elopement Risk: No Does patient have medical clearance?: Yes     Disposition:  Disposition Initial Assessment Completed for this Encounter: Yes Disposition of Patient: Other dispositions Other disposition(s): Other (Comment) (Psych MD consult)  On Site Evaluation by:   Reviewed with Physician:    Manus Rudd Janeese Mcgloin 05/15/2015 12:16 AM

## 2015-05-15 NOTE — ED Notes (Signed)
Pt is IVC and is pending consult.

## 2015-05-15 NOTE — ED Notes (Signed)
Pt keeps asking when she is being adm etc over and over , I have explained the process, but she has been here frequently and is aware of the adm process

## 2015-05-15 NOTE — BH Assessment (Signed)
Assessment Completed.  Consulted with Alveda Reasons, Nurse Practitioner Fransisca Kaufmann) who recommends the patient be observed overnight and be reassessed tomorrow (05/16/2015).  ER MD (Dr. Alphonzo Lemmings) informed of this decision.

## 2015-05-15 NOTE — ED Notes (Signed)
Pt is awake and alert tonight. She endorses passive SI but is able to contract for safety. Pt states that she does not want to return to her present group home and threatens to walk out in traffic or OD on medications from the [employer]. Pt mood is sad/anxious and her affect is blunted, but she is cooperative with staff. Writer oriented pt to the milieu and 15 minute checks initiated for safety.

## 2015-05-16 DIAGNOSIS — F329 Major depressive disorder, single episode, unspecified: Secondary | ICD-10-CM | POA: Diagnosis not present

## 2015-05-16 MED ORDER — LORAZEPAM 2 MG PO TABS
2.0000 mg | ORAL_TABLET | Freq: Once | ORAL | Status: AC
  Administered 2015-05-16: 2 mg via ORAL

## 2015-05-16 MED ORDER — LORAZEPAM 1 MG PO TABS
ORAL_TABLET | ORAL | Status: AC
  Administered 2015-05-16: 1 mg via ORAL
  Filled 2015-05-16: qty 1

## 2015-05-16 MED ORDER — MELOXICAM 7.5 MG PO TABS
ORAL_TABLET | ORAL | Status: AC
  Administered 2015-05-16: 15 mg via ORAL
  Filled 2015-05-16: qty 2

## 2015-05-16 MED ORDER — LORAZEPAM 1 MG PO TABS
1.0000 mg | ORAL_TABLET | ORAL | Status: AC
  Administered 2015-05-16: 1 mg via ORAL

## 2015-05-16 MED ORDER — LORAZEPAM 2 MG PO TABS
ORAL_TABLET | ORAL | Status: AC
  Filled 2015-05-16: qty 1

## 2015-05-16 NOTE — ED Notes (Signed)
Patient currently in day room socializing. No signs of distress noted. Maintained on 15 minute checks and observation by security camera for safety.

## 2015-05-16 NOTE — ED Notes (Signed)
BEHAVIORAL HEALTH ROUNDING Patient sleeping: Yes.   Patient alert and oriented: not applicable SLEEPING Behavior appropriate: Yes.  ; If no, describe: SLEEPING Nutrition and fluids offered: No SLEEPING Toileting and hygiene offered: NoSLEEPING Sitter present: not applicable, Q 15 min safety rounds and observation via security camera. Law enforcement present: Yes ODS 

## 2015-05-16 NOTE — ED Notes (Addendum)
Staff contacted group home and was instructed to call [facility] service to pick patient up from the hospital at [phone].

## 2015-05-16 NOTE — ED Notes (Signed)
Patient currently speaking on the telephone. No signs of distress noted. Will continue to monitor for safety.

## 2015-05-16 NOTE — ED Notes (Signed)
Patient resting quietly in room. No noted distress or abnormal behaviors noted. Will continue 15 minute checks and observation by security camera for safety. 

## 2015-05-16 NOTE — ED Notes (Signed)
Patient is in room awaiting for transportation to arrive in lobby. Will continue to monitor for safety. Maintained on 15 minute checks and observation by security camera for safety.

## 2015-05-16 NOTE — ED Notes (Signed)
ENVIRONMENTAL ASSESSMENT Potentially harmful objects out of patient reach: Yes Personal belongings secured: Yes Patient dressed in hospital provided attire only: Yes Plastic bags out of patient reach: Yes Patient care equipment (cords, cables, call bells, lines, and drains) shortened, removed, or accounted for: Yes Equipment and supplies removed from bottom of stretcher: Yes Potentially toxic materials out of patient reach: Yes Sharps container removed or out of patient reach: Yes  Patient currently in room resting. Patient shows no signs of distress. Patient received breakfast tray. Maintained on 15 minute checks and observation by security camera for safety.

## 2015-05-16 NOTE — ED Provider Notes (Signed)
Patient seen and recommended for discharge.  Psychiatrist is clear the patient for discharge and advises giving 1 mg of Ativan and discharging back to her group home. He has rescinded her commitment. Please note that in his initial consultation there was an error which I discussed with him, he does not want the patient admitted but instead wishes for her to be discharged back to her group home with ongoing outpatient care.  Filed Vitals:   05/15/15 2000 05/16/15 0844  BP: 129/62 121/79  Pulse: 70 85  Temp: 98.4 F (36.9 C) 98.2 F (36.8 C)  Resp: 18 18     Sharyn Creamer, MD 05/16/15 1754

## 2015-05-16 NOTE — Discharge Instructions (Signed)
Please follow-up with your outpatient psychiatry team.  You have been seen in the Emergency Department (ED) today for a psychiatric complaint.  You have been evaluated by psychiatry and we believe you are safe to be discharged from the hospital.    Please return to the ED immediately if you have ANY thoughts of hurting yourself or anyone else, so that we may help you.  Please avoid alcohol and drug use.  Follow up with your doctor and/or therapist as soon as possible regarding today's ED visit.   Please follow up any other recommendations and clinic appointments provided by the psychiatry team that saw you in the Emergency Department.   Major Depressive Disorder Major depressive disorder is a mental illness. It also may be called clinical depression or unipolar depression. Major depressive disorder usually causes feelings of sadness, hopelessness, or helplessness. Some people with this disorder do not feel particularly sad but lose interest in doing things they used to enjoy (anhedonia). Major depressive disorder also can cause physical symptoms. It can interfere with work, school, relationships, and other normal everyday activities. The disorder varies in severity but is longer lasting and more serious than the sadness we all feel from time to time in our lives. Major depressive disorder often is triggered by stressful life events or major life changes. Examples of these triggers include divorce, loss of your job or home, a move, and the death of a family member or close friend. Sometimes this disorder occurs for no obvious reason at all. People who have family members with major depressive disorder or bipolar disorder are at higher risk for developing this disorder, with or without life stressors. Major depressive disorder can occur at any age. It may occur just once in your life (single episode major depressive disorder). It may occur multiple times (recurrent major depressive  disorder). SYMPTOMS People with major depressive disorder have either anhedonia or depressed mood on nearly a daily basis for at least 2 weeks or longer. Symptoms of depressed mood include:  Feelings of sadness (blue or down in the dumps) or emptiness.  Feelings of hopelessness or helplessness.  Tearfulness or episodes of crying (may be observed by others).  Irritability (children and adolescents). In addition to depressed mood or anhedonia or both, people with this disorder have at least four of the following symptoms:  Difficulty sleeping or sleeping too much.   Significant change (increase or decrease) in appetite or weight.   Lack of energy or motivation.  Feelings of guilt and worthlessness.   Difficulty concentrating, remembering, or making decisions.  Unusually slow movement (psychomotor retardation) or restlessness (as observed by others).   Recurrent wishes for death, recurrent thoughts of self-harm (suicide), or a suicide attempt. People with major depressive disorder commonly have persistent negative thoughts about themselves, other people, and the world. People with severe major depressive disorder may experiencedistorted beliefs or perceptions about the world (psychotic delusions). They also may see or hear things that are not real (psychotic hallucinations). DIAGNOSIS Major depressive disorder is diagnosed through an assessment by your health care provider. Your health care provider will ask aboutaspects of your daily life, such as mood,sleep, and appetite, to see if you have the diagnostic symptoms of major depressive disorder. Your health care provider may ask about your medical history and use of alcohol or drugs, including prescription medicines. Your health care provider also may do a physical exam and blood work. This is because certain medical conditions and the use of certain substances can cause  major depressive disorder-like symptoms (secondary depression).  Your health care provider also may refer you to a mental health specialist for further evaluation and treatment. TREATMENT It is important to recognize the symptoms of major depressive disorder and seek treatment. The following treatments can be prescribed for this disorder:   Medicine. Antidepressant medicines usually are prescribed. Antidepressant medicines are thought to correct chemical imbalances in the brain that are commonly associated with major depressive disorder. Other types of medicine may be added if the symptoms do not respond to antidepressant medicines alone or if psychotic delusions or hallucinations occur.  Talk therapy. Talk therapy can be helpful in treating major depressive disorder by providing support, education, and guidance. Certain types of talk therapy also can help with negative thinking (cognitive behavioral therapy) and with relationship issues that trigger this disorder (interpersonal therapy). A mental health specialist can help determine which treatment is best for you. Most people with major depressive disorder do well with a combination of medicine and talk therapy. Treatments involving electrical stimulation of the brain can be used in situations with extremely severe symptoms or when medicine and talk therapy do not work over time. These treatments include electroconvulsive therapy, transcranial magnetic stimulation, and vagal nerve stimulation.   This information is not intended to replace advice given to you by your health care provider. Make sure you discuss any questions you have with your health care provider.   Document Released: 07/29/2012 Document Revised: 04/24/2014 Document Reviewed: 07/29/2012 Elsevier Interactive Patient Education [employer]

## 2015-05-16 NOTE — ED Notes (Signed)
Patient came to nurse stating that she was feeling increased suicidal thoughts with auditory hallucinations commanding that she kill herself upon discharge. Patient appeared to be anxious and restless at this time. Nurse administered ativan 2 mg. Will continue to assess for suicidality and further hallucinations. Maintained on 15 minute checks and observation by security camera for safety.

## 2015-05-16 NOTE — ED Notes (Signed)
Patient states that she still continues to feel suicidal and that she has a plan to jump out of a car. Patient denies HI/AVH and pain. Patient appears to have a depressed affect but is calm pleasant and cooperative. Will continue to monitor for safety . Maintained on 15 minute checks and observation by security camera for safety.

## 2015-05-16 NOTE — ED Notes (Signed)
Pt noted up to the bathroom and then returned to bed. No co's voiced.

## 2015-05-16 NOTE — ED Notes (Signed)
Patient in room eating lunch. Patient has no complaints at this time. Maintained on 15 minute checks and observation by security camera for safety.

## 2015-05-16 NOTE — ED Notes (Signed)
Patient had one episode of incontinence. When asked. Patient said she did not know why she was incontinent and states that she hasn't had an episode like this before. Will continue to monitor. Maintained on 15 minute checks and observation by security camera for safety.

## 2015-05-16 NOTE — ED Notes (Signed)
Called SOC consult in spoke to [name]  1510  Told of order about 1410 

## 2015-05-16 NOTE — ED Notes (Signed)
Called [facility] consult in spoke to [facility]  1510  Told of order about 1410

## 2015-05-16 NOTE — ED Notes (Signed)
Patient is currently sitting in the dayroom awaiting word of her discharge plan. Patient is calm and cooperative. No signs of distress noted. Maintained on 15 minute checks and observation by security camera for safety.

## 2015-05-16 NOTE — ED Notes (Signed)
Patient asleep in room. No noted distress or abnormal behavior. Will continue 15 minute checks and observation by security cameras for safety. 

## 2015-05-16 NOTE — ED Notes (Signed)
Patient is currently in dayroom. Patient states that she feels relief from auditory hallucinations after the ativan. Patient is calm and cooperative. Maintained on 15 minute checks and observation by security camera for safety.

## 2015-05-16 NOTE — ED Notes (Signed)
Patient currently denies Si/HI/AVH and pain. All belongings returned to patient. Patient to return home to group home via cab service.

## 2015-05-16 NOTE — ED Provider Notes (Signed)
-----------------------------------------   6:39 AM on 05/16/2015 -----------------------------------------   Blood pressure 129/62, pulse 70, temperature 98.4 F (36.9 C), temperature source Oral, resp. rate 18, height  (1.626 m), SpO2 99 %.  The patient had no acute events since last update.  Calm and cooperative at this time.  Disposition is pending per Psychiatry/Behavioral Medicine team recommendations.     Irean Hong, MD 05/16/15 [phone]

## 2015-05-16 NOTE — ED Notes (Signed)
Patient currently speaking with psychiatrist for consult via telephone per psychiatrist request. Permission for telephone consult verified by charge nurse.  Patient is calm and cooperative at this time. Maintained on 15 minute checks and observation by security camera for safety.

## 2015-05-19 ENCOUNTER — Encounter: Payer: Medicare Other | Attending: Internal Medicine | Admitting: Internal Medicine

## 2015-05-19 DIAGNOSIS — X58XXXS Exposure to other specified factors, sequela: Secondary | ICD-10-CM | POA: Insufficient documentation

## 2015-05-19 DIAGNOSIS — S81801S Unspecified open wound, right lower leg, sequela: Secondary | ICD-10-CM | POA: Insufficient documentation

## 2015-05-19 DIAGNOSIS — L97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 with fat layer exposed: Secondary | ICD-10-CM | POA: Diagnosis not present

## 2015-05-19 DIAGNOSIS — F603 Borderline personality disorder: Secondary | ICD-10-CM | POA: Insufficient documentation

## 2015-05-20 ENCOUNTER — Encounter: Payer: Self-pay | Admitting: Emergency Medicine

## 2015-05-20 ENCOUNTER — Emergency Department
Admission: EM | Admit: 2015-05-20 | Discharge: 2015-05-20 | Disposition: A | Discharge status: Home / Self Care | Payer: Medicare Other | Attending: Student | Admitting: Student

## 2015-05-20 DIAGNOSIS — F259 Schizoaffective disorder, unspecified: Secondary | ICD-10-CM | POA: Diagnosis not present

## 2015-05-20 DIAGNOSIS — Z7951 Long term (current) use of inhaled steroids: Secondary | ICD-10-CM | POA: Diagnosis not present

## 2015-05-20 DIAGNOSIS — F603 Borderline personality disorder: Secondary | ICD-10-CM | POA: Diagnosis present

## 2015-05-20 DIAGNOSIS — Z880 Allergy status to penicillin: Secondary | ICD-10-CM | POA: Insufficient documentation

## 2015-05-20 DIAGNOSIS — Z79899 Other long term (current) drug therapy: Secondary | ICD-10-CM | POA: Insufficient documentation

## 2015-05-20 DIAGNOSIS — Z792 Long term (current) use of antibiotics: Secondary | ICD-10-CM | POA: Diagnosis not present

## 2015-05-20 DIAGNOSIS — G809 Cerebral palsy, unspecified: Secondary | ICD-10-CM | POA: Diagnosis present

## 2015-05-20 DIAGNOSIS — F70 Mild intellectual disabilities: Secondary | ICD-10-CM | POA: Diagnosis present

## 2015-05-20 DIAGNOSIS — Z791 Long term (current) use of non-steroidal anti-inflammatories (NSAID): Secondary | ICD-10-CM | POA: Diagnosis not present

## 2015-05-20 DIAGNOSIS — F329 Major depressive disorder, single episode, unspecified: Secondary | ICD-10-CM | POA: Insufficient documentation

## 2015-05-20 DIAGNOSIS — G3184 Mild cognitive impairment, so stated: Secondary | ICD-10-CM | POA: Diagnosis not present

## 2015-05-20 DIAGNOSIS — R45851 Suicidal ideations: Secondary | ICD-10-CM | POA: Diagnosis present

## 2015-05-20 DIAGNOSIS — F32A Depression, unspecified: Secondary | ICD-10-CM

## 2015-05-20 LAB — CBC
HCT: 38.2 % (ref 35.0–47.0)
Hemoglobin: 12.9 g/dL (ref 12.0–16.0)
MCH: 31 pg (ref 26.0–34.0)
MCHC: 33.8 g/dL (ref 32.0–36.0)
MCV: 91.6 fL (ref 80.0–100.0)
PLATELETS: 307 10*3/uL (ref 150–440)
RBC: 4.17 MIL/uL (ref 3.80–5.20)
RDW: 14 % (ref 11.5–14.5)
WBC: 6.9 10*3/uL (ref 3.6–11.0)

## 2015-05-20 LAB — COMPREHENSIVE METABOLIC PANEL
ALT: 15 U/L (ref 14–54)
ANION GAP: 6 (ref 5–15)
AST: 21 U/L (ref 15–41)
Albumin: 4.1 g/dL (ref 3.5–5.0)
Alkaline Phosphatase: 46 U/L (ref 38–126)
BUN: 21 mg/dL — ABNORMAL HIGH (ref 6–20)
CHLORIDE: 107 mmol/L (ref 101–111)
CO2: 27 mmol/L (ref 22–32)
Calcium: 9.2 mg/dL (ref 8.9–10.3)
Creatinine, Ser: 0.77 mg/dL (ref 0.44–1.00)
Glucose, Bld: 109 mg/dL — ABNORMAL HIGH (ref 65–99)
POTASSIUM: 3.4 mmol/L — AB (ref 3.5–5.1)
SODIUM: 140 mmol/L (ref 135–145)
Total Bilirubin: 0.5 mg/dL (ref 0.3–1.2)
Total Protein: 7.3 g/dL (ref 6.5–8.1)

## 2015-05-20 LAB — ACETAMINOPHEN LEVEL

## 2015-05-20 LAB — SALICYLATE LEVEL

## 2015-05-20 LAB — ETHANOL

## 2015-05-20 NOTE — Progress Notes (Signed)
TTS has contacted the pts group home [address] [facility] [phone]), No answer.TTS has spoke with the lead nurse Maren Reamer @ [phone] who instructed TTS  to call [place] [facility] cab service to pick patient up from the hospital at [phone].Cab service has stated that they are in route.    05/20/2015 Cheryl Flash, MS, NCC, LPCA Therapeutic Triage Specialist

## 2015-05-20 NOTE — ED Notes (Signed)
Drew blood from left ac.  Pt brought to quad area and changed to scrubs in bathroom.  Pt voided before we could get the cup, so no specimen.  Pt bag labeled.  Has purse, clothing and boots.  She put her rings and watch in the purse.

## 2015-05-20 NOTE — Progress Notes (Signed)
DOVE, GRESHAM ([id number]) Visit Report for 05/19/2015 Chief Complaint Document Details Patient Name: [name] Date of Service: 05/19/2015 9:00 AM Medical Record Patient Account Number: [id number] Number: Treating RN: Phillis Haggis Date of Birth/Sex: 1965/09/19 (50 y.o. Female) Other Clinician: Primary Care MCLEAN-SCOCOZZA, Treating ROBSON, MICHAEL Physician: French Ana Physician/Extender: Fabiola Backer, Referring Physician: Delmer Islam in Treatment: 17 Information Obtained from: Patient Chief Complaint Right calf ulcer, status post I and D of abscess. Electronic Signature(s) Signed: 05/19/2015 4:36:57 PM By: Baltazar Najjar MD Entered By: Baltazar Najjar on 05/19/2015 09:33:52 [name] ([id number]) -------------------------------------------------------------------------------- HPI Details Patient Name: [name] Date of Service: 05/19/2015 9:00 AM Medical Record Patient Account Number: [id number] Number: Treating RN: Phillis Haggis Date of Birth/Sex: 08-Sep-1965 (50 y.o. Female) Other Clinician: Primary Care MCLEAN-SCOCOZZA, Treating ROBSON, MICHAEL Physician: French Ana Physician/Extender: Fabiola Backer, Referring Physician: Delmer Islam in Treatment: 17 History of Present Illness HPI Description: Pleasant 50 year old with history of borderline personality disorder and CP. No history of diabetes or peripheral arterial disease. Right ABI 1.2. She developed an ulceration on her right anterior calf secondary to an insect bite. This progressed to an abscess, and she underwent I and D on 01/02/2015 by Dr. Michela Pitcher. Cultures grew oxacillin sensitive staph aureus. She completed a course of clindamycin. She lives in a group home. Punch biopsy 03/24/2015 showed no evidence for malignancy. Negative for granulomatous inflammation and vasculitis. Tolerating 3 layer compression bandage with silver alginate 3 times weekly. Epifix #1 applied  04/28/2015. Showing significant improvement recently. She returns to clinic for follow-up and is without new complaints. No significant pain. No fever or chills. Minimal drainage. 05/12/2015 -- here for the third application of a Epifix today. 05/19/15; this is a 50 year old woman who lives in an assisted living. She apparently had a severe episode of sialitis of the right leg in September. Since then she had a wound on her right anterior leg. Punch biopsy in early December was negative for malignancy, vasculitis. She had her third hep B fix place last week. His appears to be doing well. Per the staff no further appendectomy fixes planned. The nurse from her facility was with her says there is been tremendous improvement in the condition of this wound Electronic Signature(s) Signed: 05/19/2015 4:36:57 PM By: Baltazar Najjar MD Entered By: Baltazar Najjar on 05/19/2015 09:35:06 [name] ([id number]) -------------------------------------------------------------------------------- Physical Exam Details Patient Name: [name] Date of Service: 05/19/2015 9:00 AM Medical Record Patient Account Number: [id number] Number: Treating RN: Phillis Haggis Date of Birth/Sex: May 03, 1965 (50 y.o. Female) Other Clinician: Primary Care MCLEAN-SCOCOZZA, Treating ROBSON, MICHAEL Physician: French Ana Physician/Extender: Fabiola Backer, Referring Physician: Delmer Islam in Treatment: 17 Notes Wound exam; healthy-looking wound on the right anterior lower leg. There is healthy granulation here. Slight amount of surface eschar however this did not require debridement. I see no reason to change current dressings Electronic Signature(s) Signed: 05/19/2015 4:36:57 PM By: Baltazar Najjar MD Entered By: Baltazar Najjar on 05/19/2015 09:35:52 [name] ([id number]) -------------------------------------------------------------------------------- Physician Orders Details Patient Name:  [name] Date of Service: 05/19/2015 9:00 AM Medical Record Patient Account Number: [id number] Number: Treating RN: Phillis Haggis Date of Birth/Sex: 10-15-1965 (50 y.o. Female) Other Clinician: Primary Care MCLEAN-SCOCOZZA, Treating ROBSON, MICHAEL Physician: French Ana Physician/Extender: Fabiola Backer, Referring Physician: Delmer Islam in Treatment: 60 Verbal / Phone Orders: Yes Clinician: Ashok Cordia, Debi Read Back and Verified: Yes Diagnosis Coding Wound Cleansing Wound #1 Right,Medial Lower Leg o Cleanse wound with mild soap and water Anesthetic Wound #1 Right,Medial Lower Leg o Topical Lidocaine  4% cream applied to wound bed prior to debridement Skin Barriers/Peri-Wound Care Wound #1 Right,Medial Lower Leg o Skin Prep Primary Wound Dressing Wound #1 Right,Medial Lower Leg o Promogran Secondary Dressing Wound #1 Right,Medial Lower Leg o ABD pad Dressing Change Frequency Wound #1 Right,Medial Lower Leg o Change dressing every week Follow-up Appointments Wound #1 Right,Medial Lower Leg o Return Appointment in 1 week. Edema Control Wound #1 Right,Medial Lower Leg o 3 Layer Compression System - Right Lower Extremity o Elevate legs to the level of the heart and pump ankles as often as possible [name] ([id number]) Electronic Signature(s) Signed: 05/19/2015 4:36:57 PM By: Baltazar Najjar MD Signed: 05/19/2015 5:03:09 PM By: Alejandro Mulling Entered By: Alejandro Mulling on 05/19/2015 09:31:14 [name] ([id number]) -------------------------------------------------------------------------------- Problem List Details Patient Name: [name] Date of Service: 05/19/2015 9:00 AM Medical Record Patient Account Number: [id number] Number: Treating RN: Phillis Haggis Date of Birth/Sex: 11/01/1965 (50 y.o. 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 S81.801S Unspecified open wound, right lower leg, sequela 01/20/2015 Yes F60.3 Borderline personality disorder 01/20/2015 Yes Inactive Problems Resolved Problems ICD-10 Code Description Active Date Resolved Date L03.115 Cellulitis of right lower limb 04/07/2015 04/07/2015 Electronic Signature(s) Signed: 05/19/2015 4:36:57 PM By: Baltazar Najjar MD Entered By: Baltazar Najjar on 05/19/2015 09:33:43 [name] ([id number]) -------------------------------------------------------------------------------- Progress Note Details Patient Name: [name] Date of Service: 05/19/2015 9:00 AM Medical Record Patient Account Number: [id number] Number: Treating RN: Phillis Haggis Date of Birth/Sex: [birth year] (50 y.o. Female) Other Clinician: Primary Care MCLEAN-SCOCOZZA, Treating ROBSON, MICHAEL Physician: French Ana Physician/Extender: Fabiola Backer, Referring Physician: Delmer Islam in Treatment: 17 Plan #1 collagen,hydrogel uncer profore lite wrap 2# no debridement is required Electronic Signature(s) Signed: 05/19/2015 4:36:57 PM By: Baltazar Najjar MD Entered By: Baltazar Najjar on 05/19/2015 09:36:54 [name] ([id number]) -------------------------------------------------------------------------------- SuperBill Details Patient Name: [name] Date of Service: 05/19/2015 Medical Record Patient Account Number: [id number] Number: Treating RN: Phillis Haggis Date of Birth/Sex: 1965-06-02 (50 y.o. Female) Other Clinician: Primary Care MCLEAN-SCOCOZZA, Treating ROBSON, MICHAEL Physician: French Ana Physician/Extender: Fabiola Backer, Weeks in Treatment: 17 Referring  Physician: TRACY Diagnosis Coding ICD-10 Codes Code Description [phone] Non-pressure chronic ulcer of right calf with fat layer exposed S81.801S Unspecified open wound, right lower leg, sequela F60.3 Borderline personality disorder Facility Procedures CPT4: Description Modifier Quantity Code [id number] (Facility Use Only) [phone] - APPLY MULTLAY COMPRS LWR RT 1 LEG Physician Procedures CPT4 Code: [id number] Description: 42595 - WC PHYS LEVEL 2 - EST PT ICD-10 Description Diagnosis L97.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 with fat Modifier: layer exposed Quantity: 1 Electronic Signature(s) Signed: 05/19/2015 4:36:57 PM By: Baltazar Najjar MD Signed: 05/19/2015 5:03:09 PM By: Alejandro Mulling Entered By: Alejandro Mulling on 05/19/2015 11:49:59

## 2015-05-20 NOTE — Progress Notes (Addendum)
[name], [name] ([id number]) Visit Report for 05/19/2015 Arrival Information Details Patient Name: [name], [name] Date of Service: 05/19/2015 9:00 AM Medical Record Patient Account Number: [id number] Number: Treating RN: Phillis Haggis Date of Birth/Sex: 16-Jun-1965 (50 y.o. Female) Other Clinician: Primary Care MCLEAN-SCOCOZZA, Treating ROBSON, MICHAEL Physician: French Ana Physician/Extender: Fabiola Backer, Referring Physician: Delmer Islam in Treatment: 17 Visit Information History Since Last Visit All ordered tests and consults were completed: No Patient Arrived: Ambulatory Added or deleted any medications: No Arrival Time: 09:08 Any new allergies or adverse reactions: No Accompanied By: caregiver Had a fall or experienced change in No Transfer Assistance: None activities of daily living that may affect Patient Identification Verified: Yes risk of falls: Secondary Verification Process Yes Signs or symptoms of abuse/neglect since last No Completed: visito Patient Requires Transmission-Based No Hospitalized since last visit: No Precautions: Pain Present Now: No Patient Has Alerts: Yes Patient Alerts: aspirin Electronic Signature(s) Signed: 05/19/2015 5:03:09 PM By: Alejandro Mulling Entered By: Alejandro Mulling on 05/19/2015 09:09:15 [name] ([id number]) -------------------------------------------------------------------------------- Complex / Palliative Patient Assessment Details Patient Name: [name] Date of Service: 05/19/2015 9:00 AM Medical Record Patient Account Number: [id number] Number: Treating RN: Phillis Haggis Date of Birth/Sex: 1965/05/03 (50 y.o. Female) Other Clinician: Primary Care MCLEAN-SCOCOZZA, Treating ROBSON, MICHAEL Physician: French Ana Physician/Extender: Fabiola Backer, Referring Physician: Delmer Islam in Treatment: 17 Palliative Management Criteria Complex Wound Management Criteria The patient has  limited personal or cognitive resources, or has no access to appropriate ongoing care providers, such that it is unreasonable to expect a level of compliance with prescribed advanced wound care treatments necessary to achieve desired healing outcomes. (Must be documented in physician progress notes.) Care Approach Wound Care Plan: Complex Wound Management Electronic Signature(s) Signed: 05/20/2015 5:06:12 PM By: Alejandro Mulling Signed: 05/25/2015 3:38:56 PM By: Baltazar Najjar MD Entered By: Alejandro Mulling on 05/20/2015 17:06:11 [name] ([id number]) -------------------------------------------------------------------------------- Encounter Discharge Information Details Patient Name: [name] Date of Service: 05/19/2015 9:00 AM Medical Record Patient Account Number: [id number] Number: Treating RN: Phillis Haggis Date of Birth/Sex: 10/28/1965 (50 y.o. Female) Other Clinician: Primary Care MCLEAN-SCOCOZZA, Treating ROBSON, MICHAEL Physician: French Ana Physician/Extender: Fabiola Backer, Referring Physician: Delmer Islam in Treatment: 17 Encounter Discharge Information Items Discharge Pain Level: 0 Discharge Condition: Stable Ambulatory Status: Ambulatory Discharge Destination: Nursing Home Transportation: Private Auto Accompanied By: caregiver Schedule Follow-up Appointment: Yes Medication Reconciliation completed and provided to Patient/Care Yes [name]: Provided on Clinical Summary of Care: 05/19/2015 Form Type Recipient Paper Patient [place] Electronic Signature(s) Signed: 05/19/2015 9:57:26 AM By: Gwenlyn Perking Entered By: Gwenlyn Perking on 05/19/2015 09:57:26 [name] ([id number]) -------------------------------------------------------------------------------- Lower Extremity Assessment Details Patient Name: [name] Date of Service: 05/19/2015 9:00 AM Medical Record Patient Account Number: [id number] Number: Treating RN:  Phillis Haggis Date of Birth/Sex: [birth year] (50 y.o. Female) Other Clinician: Primary Care MCLEAN-SCOCOZZA, Treating ROBSON, MICHAEL Physician: French Ana Physician/Extender: Fabiola Backer, Referring Physician: Delmer Islam in Treatment: 17 Edema Assessment Assessed: [Left: No] [Right: No] E[Left: dema] [Right: :] Calf Left: Right: Point of Measurement: cm From Medial Instep cm 36 cm Ankle Left: Right: Point of Measurement: cm From Medial Instep cm 21 cm Vascular Assessment Pulses: Posterior Tibial Dorsalis Pedis Palpable: [Right:Yes] Extremity colors, hair growth, and conditions: Extremity Color: [Right:Normal] Temperature of Extremity: [Right:Warm] Capillary Refill: [Right:< 3 seconds] Toe Nail Assessment Left: Right: Thick: No Discolored: No Deformed: No Improper Length and Hygiene: No Electronic Signature(s) Signed: 05/19/2015 5:03:09 PM By: Alejandro Mulling Entered By: Alejandro Mulling on 05/19/2015 09:21:00 [name] ([id number]) [name], [name] ([id number]) --------------------------------------------------------------------------------  Multi Wound Chart Details Patient Name: [name], [name] Date of Service: 05/19/2015 9:00 AM Medical Record Patient Account Number: [id number] Number: Treating RN: Phillis Haggis Date of Birth/Sex: [birth year] (50 y.o. Female) Other Clinician: Primary Care MCLEAN-SCOCOZZA, Treating ROBSON, MICHAEL Physician: French Ana Physician/Extender: Fabiola Backer, Referring Physician: Delmer Islam in Treatment: 17 Vital Signs Height(in): 64 Pulse(bpm): 79 Weight(lbs): 215 Blood Pressure 123/88 (mmHg): Body Mass Index(BMI): 37 Temperature(F): 98.1 Respiratory Rate 20 (breaths/min): Photos: [1:No Photos] [N/A:N/A] Wound Location: [1:Right Lower Leg - Medial] [N/A:N/A] Wounding Event: [1:Bite] [N/A:N/A] Primary Etiology: [1:Infection - not elsewhere classified] [N/A:N/A] Date Acquired: [1:12/28/2014]  [N/A:N/A] Weeks of Treatment: [1:17] [N/A:N/A] Wound Status: [1:Open] [N/A:N/A] Measurements L x W x D 2x1x0.1 [N/A:N/A] (cm) Area (cm) : [1:1.571] [N/A:N/A] Volume (cm) : [1:0.157] [N/A:N/A] % Reduction in Area: [1:73.30%] [N/A:N/A] % Reduction in Volume: 73.30% [N/A:N/A] Classification: [1:Full Thickness Without Exposed Support Structures] [N/A:N/A] Exudate Amount: [1:Large] [N/A:N/A] Exudate Type: [1:Serosanguineous] [N/A:N/A] Exudate Color: [1:red, brown] [N/A:N/A] Wound Margin: [1:Flat and Intact] [N/A:N/A] Granulation Amount: [1:Large (67-100%)] [N/A:N/A] Granulation Quality: [1:Red] [N/A:N/A] Necrotic Amount: [1:Small (1-33%)] [N/A:N/A] Necrotic Tissue: [1:Eschar] [N/A:N/A] Exposed Structures: [1:Fascia: No Fat: No] [N/A:N/A] Tendon: No Muscle: No Joint: No Bone: No Limited to Skin Breakdown Epithelialization: Medium (34-66%) N/A N/A Periwound Skin Texture: Edema: Yes N/A N/A Scarring: Yes Excoriation: No Induration: No Callus: No Crepitus: No Fluctuance: No Friable: No Rash: No Periwound Skin Moist: Yes N/A N/A Moisture: Maceration: No Dry/Scaly: No Periwound Skin Color: Atrophie Blanche: No N/A N/A Cyanosis: No Ecchymosis: No Erythema: No Hemosiderin Staining: No Mottled: No Pallor: No Rubor: No Temperature: No Abnormality N/A N/A Tenderness on No N/A N/A Palpation: Wound Preparation: Ulcer Cleansing: Other: N/A N/A soap and water Topical Anesthetic Applied: Other: lidocaine 4% Treatment Notes Electronic Signature(s) Signed: 05/19/2015 5:03:09 PM By: Alejandro Mulling Entered By: Alejandro Mulling on 05/19/2015 09:23:26 [name] ([id number]) -------------------------------------------------------------------------------- Multi-Disciplinary Care Plan Details Patient Name: [name] Date of Service: 05/19/2015 9:00 AM Medical Record Patient Account Number: [id number] Number: Treating RN: Phillis Haggis Date of  Birth/Sex: 1965/04/26 (50 y.o. Female) Other Clinician: Primary Care MCLEAN-SCOCOZZA, Treating ROBSON, MICHAEL Physician: French Ana Physician/Extender: Fabiola Backer, Referring Physician: Delmer Islam in Treatment: 33 Active Inactive Orientation to the Wound Care Program Nursing Diagnoses: Knowledge deficit related to the wound healing center program Goals: Patient/caregiver will verbalize understanding of the Wound Healing Center Program Date Initiated: 01/20/2015 Goal Status: Active Interventions: Provide education on orientation to the wound center Notes: Soft Tissue Infection Nursing Diagnoses: Potential for infection: soft tissue Goals: Patient will remain free of wound infection Date Initiated: 01/20/2015 Goal Status: Active Interventions: Assess signs and symptoms of infection every visit Treatment Activities: Culture and sensitivity : 05/19/2015 Notes: Wound/Skin Impairment [name], [name] ([id number]) Nursing Diagnoses: Impaired tissue integrity Goals: Ulcer/skin breakdown will heal within 14 weeks Date Initiated: 01/20/2015 Goal Status: Active Interventions: Assess patient/caregiver ability to obtain necessary supplies Notes: Electronic Signature(s) Signed: 05/19/2015 5:03:09 PM By: Alejandro Mulling Entered By: Alejandro Mulling on 05/19/2015 09:23:18 [name] ([id number]) -------------------------------------------------------------------------------- Pain Assessment Details Patient Name: [name] Date of Service: 05/19/2015 9:00 AM Medical Record Patient Account Number: [id number] Number: Treating RN: Phillis Haggis Date of Birth/Sex: November 12, 1965 (50 y.o. Female) Other Clinician: Primary Care MCLEAN-SCOCOZZA, Treating ROBSON, MICHAEL Physician: French Ana Physician/Extender: Fabiola Backer, Referring Physician: Delmer Islam in Treatment: 17 Active Problems Location of Pain Severity and Description of Pain Patient Has Paino  No Site Locations Pain Management and Medication Current Pain Management: Electronic Signature(s) Signed: 05/19/2015 5:03:09 PM By: Alejandro Mulling Entered By:  Alejandro Mulling on 05/19/2015 09:09:23 [name], [name] ([id number]) -------------------------------------------------------------------------------- Patient/Caregiver Education Details Patient Name: [name], [name] Date of Service: 05/19/2015 9:00 AM Medical Record Patient Account Number: [id number] Number: Treating RN: Phillis Haggis Date of Birth/Gender: [birth year] (50 y.o. Female) Other Clinician: Primary Care MCLEAN-SCOCOZZA, Treating ROBSON, MICHAEL Physician: French Ana Physician/Extender: Fabiola Backer, Weeks in Treatment: 17 Referring Physician: TRACY Education Assessment Education Provided To: Caregiver Education Topics Provided Wound/Skin Impairment: Handouts: Other: do not get wrap wet Methods: Demonstration, Explain/Verbal Responses: State content correctly Electronic Signature(s) Signed: 05/19/2015 5:03:09 PM By: Alejandro Mulling Entered By: Alejandro Mulling on 05/19/2015 09:32:49 [name] ([id number]) -------------------------------------------------------------------------------- Wound Assessment Details Patient Name: [name] Date of Service: 05/19/2015 9:00 AM Medical Record Patient Account Number: [id number] Number: Treating RN: Phillis Haggis Date of Birth/Sex: 28-Oct-1965 (50 y.o. Female) Other Clinician: Primary Care MCLEAN-SCOCOZZA, Treating ROBSON, MICHAEL Physician: French Ana Physician/Extender: Fabiola Backer, Referring Physician: Delmer Islam in Treatment: 17 Wound Status Wound Number: 1 Primary Etiology: Infection - not elsewhere classified Wound Location: Right Lower Leg - Medial Wound Status: Open Wounding Event: Bite Date Acquired: 12/28/2014 Weeks Of Treatment: 17 Clustered Wound: No Photos Photo Uploaded By: Alejandro Mulling on 05/19/2015  11:30:38 Wound Measurements Length: (cm) 2 Width: (cm) 1 Depth: (cm) 0.1 Area: (cm) 1.571 Volume: (cm) 0.157 % Reduction in Area: 73.3% % Reduction in Volume: 73.3% Epithelialization: Medium (34-66%) Tunneling: No Undermining: No Wound Description Full Thickness Without Exposed Classification: Support Structures Wound Margin: Flat and Intact Exudate Large Amount: Exudate Type: Serosanguineous Exudate Color: red, brown Foul Odor After Cleansing: No Wound Bed Guaman, [name] ([id number]) Granulation Amount: Large (67-100%) Exposed Structure Granulation Quality: Red Fascia Exposed: No Necrotic Amount: Small (1-33%) Fat Layer Exposed: No Necrotic Quality: Eschar Tendon Exposed: No Muscle Exposed: No Joint Exposed: No Bone Exposed: No Limited to Skin Breakdown Periwound Skin Texture Texture Color No Abnormalities Noted: No No Abnormalities Noted: No Callus: No Atrophie Blanche: No Crepitus: No Cyanosis: No Excoriation: No Ecchymosis: No Fluctuance: No Erythema: No Friable: No Hemosiderin Staining: No Induration: No Mottled: No Localized Edema: Yes Pallor: No Rash: No Rubor: No Scarring: Yes Temperature / Pain Moisture Temperature: No Abnormality No Abnormalities Noted: No Dry / Scaly: No Maceration: No Moist: Yes Wound Preparation Ulcer Cleansing: Other: soap and water, Topical Anesthetic Applied: Other: lidocaine 4%, Treatment Notes Wound #1 (Right, Medial Lower Leg) 1. Cleansed with: Cleanse wound with antibacterial soap and water 2. Anesthetic Topical Lidocaine 4% cream to wound bed prior to debridement 3. Peri-wound Care: Skin Prep 4. Dressing Applied: Promogran 5. Secondary Dressing Applied ABD Pad 7. Secured with Tape 3 Layer Compression System - Right Lower Extremity [name], HEINZELMAN ([id number]) Electronic Signature(s) Signed: 05/19/2015 5:03:09 PM By: Alejandro Mulling Entered By: Alejandro Mulling on 05/19/2015  09:19:49 [name] ([id number]) -------------------------------------------------------------------------------- Vitals Details Patient Name: [name] Date of Service: 05/19/2015 9:00 AM Medical Record Patient Account Number: [id number] Number: Treating RN: Phillis Haggis Date of Birth/Sex: 11/23/1965 (50 y.o. Female) Other Clinician: Primary Care MCLEAN-SCOCOZZA, Treating ROBSON, MICHAEL Physician: French Ana Physician/Extender: Fabiola Backer, Referring Physician: Delmer Islam in Treatment: 17 Vital Signs Time Taken: 09:09 Temperature (F): 98.1 Height (in): 64 Pulse (bpm): 79 Weight (lbs): 215 Respiratory Rate (breaths/min): 20 Body Mass Index (BMI): 36.9 Blood Pressure (mmHg): 123/88 Reference Range: 80 - 120 mg / dl Electronic Signature(s) Signed: 05/19/2015 5:03:09 PM By: Alejandro Mulling Entered By: Alejandro Mulling on 05/19/2015 09:12:48

## 2015-05-20 NOTE — ED Provider Notes (Signed)
[facility] Emergency Department Provider Note  ____________________________________________  Time seen: Approximately 3:10 PM  I have reviewed the triage vital signs and the nursing notes.   HISTORY  Chief Complaint Suicidal    HPI [name] is a 50 y.o. female she is schizoaffective disorder, borderline personality disorder, anxiety, GERD, mild cognitive impairment presents for evaluation of suicidal ideation, worsening depression today, gradual onset, constant since onset, currently severe. The patient reports that today she had a phone conversation with a family member that upset her. It triggered her suicidal ideation with plan to jump out into traffic. Of note, she was just discharged from the psychiatric service on January 29 for the exact same complaint. No homicidal ideation or audiovisual hallucinations. No recent illness and no cough, sneezing, runny nose, congestion, vomiting, diarrhea, fevers or chills, no chest pain or difficulty breathing.   Past Medical History  Diagnosis Date  . Asthma   . Anxiety   . GERD (gastroesophageal reflux disease)   . Mild cognitive impairment   . Cerebral palsy (HCC)   . Borderline personality disorder   . Depression   . Wound abscess   . Schizoaffective disorder [facility])     Patient Active Problem List   Diagnosis Date Noted  . Anxiety 12/31/2014  . Cellulitis and abscess of leg 12/31/2014  . 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 GERD (gastroesophageal reflux disease) 09/24/2014  . COPD (chronic obstructive pulmonary disease) (HCC) 09/24/2014  . Borderline personality disorder 09/24/2014  . Major depressive disorder, recurrent episode, moderate (HCC) 09/24/2014  . Cerebral palsy (HCC) 08/31/2014    Past Surgical History  Procedure Laterality Date  . Cholecystectomy    . Tonsillectomy    . Incision and drainage abscess Right 01/02/2015    Procedure: INCISION AND DRAINAGE ABSCESS;  Surgeon:  Tiney Rouge III, MD;  Location: [facility] ORS;  Service: General;  Laterality: Right;    Current Outpatient Rx  Name  Route  Sig  Dispense  Refill  . acetaminophen (TYLENOL) 500 MG tablet   Oral   Take 500 mg by mouth every 4 (four) hours as needed for mild pain, moderate pain, fever or headache.         . ARIPiprazole (ABILIFY) 10 MG tablet   Oral   Take 1 tablet (10 mg total) by mouth daily.   30 tablet   0   . azithromycin (ZITHROMAX Z-PAK) 250 MG tablet      Take 2 tablets (500 mg) on  Day 1,  followed by 1 tablet (250 mg) once daily on Days 2 through 5.   6 each   0   . diphenoxylate-atropine (LOMOTIL) 2.5-0.025 MG per tablet   Oral   Take 1 tablet by mouth every 6 (six) hours as needed for diarrhea or loose stools.         Marland Kitchen escitalopram (LEXAPRO) 20 MG tablet   Oral   Take 1 tablet (20 mg total) by mouth at bedtime.   30 tablet   0   . fenofibrate 54 MG tablet   Oral   Take 54 mg by mouth daily.         . Fluticasone-Salmeterol (ADVAIR) 100-50 MCG/DOSE AEPB   Inhalation   Inhale 1 puff into the lungs every 12 (twelve) hours.          Marland Kitchen guaiFENesin-codeine 100-10 MG/5ML syrup   Oral   Take 10 mLs by mouth every 4 (four) hours as needed for cough.   180 mL  0   . ibuprofen (ADVIL,MOTRIN) 800 MG tablet   Oral   Take 1 tablet (800 mg total) by mouth every 8 (eight) hours as needed.   30 tablet   0   . ketoconazole (NIZORAL) 2 % cream   Topical   Apply 1 application topically 2 (two) times daily as needed for irritation. To breast and urinal areas         . meloxicam (MOBIC) 15 MG tablet   Oral   Take 15 mg by mouth daily.         . miconazole (ZEASORB-AF) 2 % powder   Topical   Apply 1 application topically 2 (two) times daily.         . pantoprazole (PROTONIX) 40 MG tablet   Oral   Take 40 mg by mouth daily.         . traMADol (ULTRAM) 50 MG tablet   Oral   Take 50 mg by mouth 2 (two) times daily as needed for moderate pain or severe  pain.           Allergies Penicillins  Family History  Problem Relation Age of Onset  . Diabetes Mother   . Heart attack Father     Social History Social History  Substance Use Topics  . Smoking status: Never Smoker   . Smokeless tobacco: Never Used  . Alcohol Use: No    Review of Systems Constitutional: No fever/chills Eyes: No visual changes. Green: No sore throat. Cardiovascular: Denies chest pain. Respiratory: Denies shortness of breath. Gastrointestinal: No abdominal pain.  No nausea, no vomiting.  No diarrhea.  No constipation. Genitourinary: Negative for dysuria. Musculoskeletal: Negative for back pain. Skin: Negative for rash. Neurological: Negative for headaches, focal weakness or numbness.  10-point ROS otherwise negative.  ____________________________________________   PHYSICAL EXAM:  VITAL SIGNS: ED Triage Vitals  Enc Vitals Group     BP 05/20/15 1435 110/73 mmHg     Pulse Rate 05/20/15 1435 88     Resp 05/20/15 1435 14     Temp 05/20/15 1435 99.3 F (37.4 C)     Temp Source 05/20/15 1435 Oral     SpO2 05/20/15 1435 96 %     Weight 05/20/15 1435 207 lb (93.895 kg)     Height 05/20/15 1435  (1.626 m)     Head Cir --      Peak Flow --      Pain Score 05/20/15 1439 0     Pain Loc --      Pain Edu? --      Excl. in GC? --     Constitutional: Alert and oriented. Well appearing and in no acute distress. +mild cognitive impairment Eyes: Conjunctivae are normal. PERRL. EOMI. Head: Atraumatic. Nose: No congestion/rhinnorhea. Mouth/Throat: Mucous membranes are moist.  Oropharynx non-erythematous. Neck: No stridor.  Cardiovascular: Normal rate, regular rhythm. Grossly normal heart sounds.  Good peripheral circulation. Respiratory: Normal respiratory effort.  No retractions. Lungs CTAB. Gastrointestinal: Soft and nontender. No distention.  No CVA tenderness. Genitourinary: deferred Musculoskeletal: No lower extremity tenderness nor edema.  No  joint effusions. Neurologic:  Normal speech and language. No gross focal neurologic deficits are appreciated. No gait instability. Skin:  Skin is warm, dry and intact. No rash noted. Psychiatric: Mood and affect are normal. Speech and behavior are normal.  ____________________________________________   LABS (all labs ordered are listed, but only abnormal results are displayed)  Labs Reviewed  COMPREHENSIVE METABOLIC PANEL - Abnormal; Notable  for the following:    Potassium 3.4 (*)    Glucose, Bld 109 (*)    BUN 21 (*)    All other components within normal limits  ACETAMINOPHEN LEVEL - Abnormal; Notable for the following:    Acetaminophen (Tylenol), Serum <10 (*)    All other components within normal limits  ETHANOL  SALICYLATE LEVEL  CBC  URINE DRUG SCREEN, QUALITATIVE ([facility] ONLY)   ____________________________________________  EKG  none ____________________________________________  RADIOLOGY  none ____________________________________________   PROCEDURES  Procedure(s) performed: None  Critical Care performed: No  ____________________________________________   INITIAL IMPRESSION / ASSESSMENT AND PLAN / ED COURSE  Pertinent labs & imaging results that were available during my care of the patient were reviewed by me and considered in my medical decision making (see chart for details).  [name] is a 50 y.o. female she is schizoaffective disorder, borderline personality disorder, anxiety, GERD, mild cognitive impairment presents for evaluation of suicidal ideation, worsening depression today. No HI or audiovisual hallucinations. On exam, she is generally well-appearing and in no acute distress. Vital signs are stable, she is afebrile. She has a benign physical examination and no acute medical complaints. Screening psych labs reviewed. Unremarkable CBC and BMP, undetectable ethanol, salicylate, and acetaminophen levels. Dr. Delaney Meigs of psychiatry is evaluating  the patient and recommends discharge with continued follow-up with her outpatient psychiatric providers. DC home with return precautions. ____________________________________________   FINAL CLINICAL IMPRESSION(S) / ED DIAGNOSES  Final diagnoses:  Depression  Borderline personality disorder      Gayla Doss, MD 05/20/15 1806

## 2015-05-20 NOTE — Consult Note (Signed)
[facility] Consult   Reason for Consult:  Consult for this 50 year old woman with chronic mental health and behavior issues who presented to the emergency room stating that she was "suicidal" Referring Physician:  Edd Fabian [name] MRN:  [id number] Principal Diagnosis: Borderline personality disorder Diagnosis:   Patient Active Problem List   Diagnosis Date Noted  . Anxiety [F41.9] 12/31/2014  . Cellulitis and abscess of leg [L02.419, L03.119] 12/31/2014  . 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 GERD (gastroesophageal reflux disease) [K21.9] 09/24/2014  . COPD (chronic obstructive pulmonary disease) ([place]) [J44.9] 09/24/2014  . Borderline personality disorder [F60.3] 09/24/2014  . Major depressive disorder, recurrent episode, moderate ([place]) [F33.1] 09/24/2014  . Cerebral palsy ([place]) [G80.9] 08/31/2014    Total Time spent with patient: 1 hour  Subjective:   [name] is a 50 y.o. female patient admitted with "I woke up today and I was feeling depressed".  HPI:  Patient interviewed. Chart reviewed. Recent notes reviewed labs reviewed. This patient who was just here in our emergency room last week presents to the emergency room stating that she woke up today feeling depressed anyway but when she talked to her brother on the phone she started feeling "suicidal". She says that her brother was cussing at her because she was demanding that he take her to see their mother. This is a recurrent thing. She and I have had the same conversation before. She is always wanting her brother to take her to see her mother and out of the group home and the brother rarely wants to do it which results in the patient getting upset. Patient did not take any overdose or do anything to try and hurt her self. She doesn't have any actual intention of doing anything to herself. She doesn't have any actual wish to die. What she does want [place] to live in a  different group home because she doesn't like the neighborhood of her current one. This is also a recurrent theme we have addressed before. Patient's mood is chronically on edge. She is lonely and has poor coping skills. She is begging to come into the hospital because she likes attending therapy groups and finds her current group home to be an anxiety provoking situation. Patient does not present with acute psychotic symptoms. Not abusing substances. Has a safe place to live.  Social history: Patient lives in a group home or assisted living facility with about 34 or 12 people total. She claims that it is in a bad neighborhood and that's why she doesn't like it. She has been to several group homes actually and doesn't really ever seem to like any of them. Her mother is still living apparently but is quite elderly and may be in some kind of facility herself. The brother is her closest contact.  Medical history: Multiple medical problems including gastric reflux, a history of cerebral palsy, history of skin infections  Substance abuse history: Not abusing alcohol or other drugs no significant past substance abuse history  Past Psychiatric History: Patient has a long history of mental health problems essentially lifelong. Has some degree of developmental disability related to her cerebral palsy. Patient does have a history of trying to harm herself but it's been over a decade since the last time. She is currently on Abilify and has appropriate outpatient psychiatric care  Risk to Self: Is patient at risk for suicide?: Yes Risk to Others:   Prior Inpatient Therapy:   Prior Outpatient Therapy:  Past Medical History:  Past Medical History  Diagnosis Date  . Asthma   . Anxiety   . GERD (gastroesophageal reflux disease)   . Mild cognitive impairment   . Cerebral palsy ([place])   . Borderline personality disorder   . Depression   . Wound abscess   . Schizoaffective disorder [facility])     Past Surgical  History  Procedure Laterality Date  . Cholecystectomy    . Tonsillectomy    . Incision and drainage abscess Right 01/02/2015    Procedure: INCISION AND DRAINAGE ABSCESS;  Surgeon: Dia Crawford III, MD;  Location: [facility] ORS;  Service: General;  Laterality: Right;   Family History:  Family History  Problem Relation Age of Onset  . Diabetes Mother   . Heart attack Father    Family Psychiatric  History: There is no identified family history of mental illness that she knows of Social History:  History  Alcohol Use No     History  Drug Use No    Social History   Social History  . Marital Status: Single    Spouse Name: N/A  . Number of Children: N/A  . Years of Education: N/A   Social History Main Topics  . Smoking status: Never Smoker   . Smokeless tobacco: Never Used  . Alcohol Use: No  . Drug Use: No  . Sexual Activity: Yes    Birth Control/ Protection: Implant   Other Topics Concern  . None   Social History Narrative   Additional Social History:    Allergies:   Allergies  Allergen Reactions  . Penicillins Other (See Comments)    Pt states that she is "just scared to take it".      Labs:  Results for orders placed or performed during the hospital encounter of 05/20/15 (from the past 48 hour(s))  Comprehensive metabolic panel     Status: Abnormal   Collection Time: 05/20/15  2:57 PM  Result Value Ref Range   Sodium 140 135 - 145 mmol/L   Potassium 3.4 (L) 3.5 - 5.1 mmol/L   Chloride 107 101 - 111 mmol/L   CO2 27 22 - 32 mmol/L   Glucose, Bld 109 (H) 65 - 99 mg/dL   BUN 21 (H) 6 - 20 mg/dL   Creatinine, Ser 0.77 0.44 - 1.00 mg/dL   Calcium 9.2 8.9 - 10.3 mg/dL   Total Protein 7.3 6.5 - 8.1 g/dL   Albumin 4.1 3.5 - 5.0 g/dL   AST 21 15 - 41 U/L   ALT 15 14 - 54 U/L   Alkaline Phosphatase 46 38 - 126 U/L   Total Bilirubin 0.5 0.3 - 1.2 mg/dL   GFR calc non Af Amer >60 >60 mL/min   GFR calc Af Amer >60 >60 mL/min    Comment: (NOTE) The eGFR has been  calculated using the CKD EPI equation. This calculation has not been validated in all clinical situations. eGFR's persistently <60 mL/min signify possible Chronic Kidney Disease.    Anion gap 6 5 - 15  Ethanol (ETOH)     Status: None   Collection Time: 05/20/15  2:57 PM  Result Value Ref Range   Alcohol, Ethyl (B) <5 <5 mg/dL    Comment:        LOWEST DETECTABLE LIMIT FOR SERUM ALCOHOL IS 5 mg/dL FOR MEDICAL PURPOSES ONLY   Salicylate level     Status: None   Collection Time: 05/20/15  2:57 PM  Result Value Ref Range  Salicylate Lvl <1.6 2.8 - 30.0 mg/dL  Acetaminophen level     Status: Abnormal   Collection Time: 05/20/15  2:57 PM  Result Value Ref Range   Acetaminophen (Tylenol), Serum <10 (L) 10 - 30 ug/mL    Comment:        THERAPEUTIC CONCENTRATIONS VARY SIGNIFICANTLY. A RANGE OF 10-30 ug/mL MAY BE AN EFFECTIVE CONCENTRATION FOR MANY PATIENTS. HOWEVER, SOME ARE BEST TREATED AT CONCENTRATIONS OUTSIDE THIS RANGE. ACETAMINOPHEN CONCENTRATIONS >150 ug/mL AT 4 HOURS AFTER INGESTION AND >50 ug/mL AT 12 HOURS AFTER INGESTION ARE OFTEN ASSOCIATED WITH TOXIC REACTIONS.   CBC     Status: None   Collection Time: 05/20/15  2:57 PM  Result Value Ref Range   WBC 6.9 3.6 - 11.0 K/uL   RBC 4.17 3.80 - 5.20 MIL/uL   Hemoglobin 12.9 12.0 - 16.0 g/dL   HCT 38.2 35.0 - 47.0 %   MCV 91.6 80.0 - 100.0 fL   MCH 31.0 26.0 - 34.0 pg   MCHC 33.8 32.0 - 36.0 g/dL   RDW 14.0 11.5 - 14.5 %   Platelets 307 150 - 440 K/uL    No current facility-administered medications for this encounter.   Current Outpatient Prescriptions  Medication Sig Dispense Refill  . acetaminophen (TYLENOL) 500 MG tablet Take 500 mg by mouth every 4 (four) hours as needed for mild pain, moderate pain, fever or headache.    . ARIPiprazole (ABILIFY) 10 MG tablet Take 1 tablet (10 mg total) by mouth daily. 30 tablet 0  . azithromycin (ZITHROMAX Z-PAK) 250 MG tablet Take 2 tablets (500 mg) on  Day 1,  followed by  1 tablet (250 mg) once daily on Days 2 through 5. 6 each 0  . diphenoxylate-atropine (LOMOTIL) 2.5-0.025 MG per tablet Take 1 tablet by mouth every 6 (six) hours as needed for diarrhea or loose stools.    Marland Kitchen escitalopram (LEXAPRO) 20 MG tablet Take 1 tablet (20 mg total) by mouth at bedtime. 30 tablet 0  . fenofibrate 54 MG tablet Take 54 mg by mouth daily.    . Fluticasone-Salmeterol (ADVAIR) 100-50 MCG/DOSE AEPB Inhale 1 puff into the lungs every 12 (twelve) hours.     Marland Kitchen guaiFENesin-codeine 100-10 MG/5ML syrup Take 10 mLs by mouth every 4 (four) hours as needed for cough. 180 mL 0  . ibuprofen (ADVIL,MOTRIN) 800 MG tablet Take 1 tablet (800 mg total) by mouth every 8 (eight) hours as needed. 30 tablet 0  . ketoconazole (NIZORAL) 2 % cream Apply 1 application topically 2 (two) times daily as needed for irritation. To breast and urinal areas    . meloxicam (MOBIC) 15 MG tablet Take 15 mg by mouth daily.    . miconazole (ZEASORB-AF) 2 % powder Apply 1 application topically 2 (two) times daily.    . pantoprazole (PROTONIX) 40 MG tablet Take 40 mg by mouth daily.    . traMADol (ULTRAM) 50 MG tablet Take 50 mg by mouth 2 (two) times daily as needed for moderate pain or severe pain.      Musculoskeletal: Strength & Muscle Tone: within normal limits Gait & Station: normal Patient leans: N/A  Psychiatric Specialty Exam: Review of Systems  Constitutional: Negative.   HENT: Negative.   Eyes: Negative.   Respiratory: Negative.   Cardiovascular: Negative.   Gastrointestinal: Negative.   Musculoskeletal: Negative.   Skin: Negative.   Neurological: Negative.   Psychiatric/Behavioral: Positive for depression and suicidal ideas. Negative for hallucinations, memory loss and substance abuse. The  patient is nervous/anxious. The patient does not have insomnia.     Blood pressure 110/73, pulse 88, temperature 99.3 F (37.4 C), temperature source Oral, resp. rate 14, height '5\' 4"'  (1.626 m), weight 93.895  kg (207 lb), SpO2 96 %.Body mass index is 35.51 kg/(m^2).  General Appearance: Casual  Eye Contact::  Fair  Speech:  Slow  Volume:  Decreased  Mood:  Anxious  Affect:  Constricted  Thought Process:  Goal Directed  Orientation:  Full (Time, Place, and Person)  Thought Content:  Negative  Suicidal Thoughts:  Yes.  without intent/plan  Homicidal Thoughts:  No  Memory:  Immediate;   Good Recent;   Fair Remote;   Fair  Judgement:  Impaired  Insight:  Shallow  Psychomotor Activity:  Psychomotor Retardation  Concentration:  Fair  Recall:  [place]: Fair  Akathisia:  No  Handed:  Right  AIMS (if indicated):     Assets:  Agricultural consultant Housing Leisure Time Social Support  ADL's:  Intact  Cognition: Impaired,  Mild  Sleep:      Treatment Plan Summary: Plan 50 year old woman who is in the same mental state she was when she was released from the emergency room several days ago. She has developed something of a dependence on the emergency room and uses this as a respite every time she gets to feeling anxious. Patient is not actually threatening or planning or desiring to hurt herself. She has appropriate outpatient care which she has been compliant with. Patient is not likely to benefit from and does not require inpatient hospital treatment. Supportive counseling completed. No change to medicine. She is not under involuntary commitment. Case reviewed with TTS. She can return to her living facility and continue with her regular outpatient treatment.  Disposition: Patient does not meet criteria for psychiatric inpatient admission.  Alethia Berthold, MD 05/20/2015 5:47 PM

## 2015-05-20 NOTE — ED Notes (Signed)
Says she is suicidal since this am.  Brought by police

## 2015-05-24 ENCOUNTER — Encounter: Payer: Self-pay | Admitting: Emergency Medicine

## 2015-05-24 ENCOUNTER — Emergency Department
Admission: EM | Admit: 2015-05-24 | Discharge: 2015-05-25 | Disposition: A | Discharge status: Other Acute Inpatient Hospital | Payer: Medicare Other | Attending: Emergency Medicine | Admitting: Emergency Medicine

## 2015-05-24 DIAGNOSIS — Z880 Allergy status to penicillin: Secondary | ICD-10-CM | POA: Insufficient documentation

## 2015-05-24 DIAGNOSIS — F32A Depression, unspecified: Secondary | ICD-10-CM

## 2015-05-24 DIAGNOSIS — F329 Major depressive disorder, single episode, unspecified: Secondary | ICD-10-CM | POA: Insufficient documentation

## 2015-05-24 DIAGNOSIS — F1310 Sedative, hypnotic or anxiolytic abuse, uncomplicated: Secondary | ICD-10-CM | POA: Diagnosis not present

## 2015-05-24 DIAGNOSIS — Z792 Long term (current) use of antibiotics: Secondary | ICD-10-CM | POA: Insufficient documentation

## 2015-05-24 DIAGNOSIS — F331 Major depressive disorder, recurrent, moderate: Secondary | ICD-10-CM | POA: Diagnosis not present

## 2015-05-24 DIAGNOSIS — F603 Borderline personality disorder: Secondary | ICD-10-CM | POA: Diagnosis present

## 2015-05-24 DIAGNOSIS — J449 Chronic obstructive pulmonary disease, unspecified: Secondary | ICD-10-CM | POA: Diagnosis present

## 2015-05-24 DIAGNOSIS — Z791 Long term (current) use of non-steroidal anti-inflammatories (NSAID): Secondary | ICD-10-CM | POA: Insufficient documentation

## 2015-05-24 DIAGNOSIS — Z3202 Encounter for pregnancy test, result negative: Secondary | ICD-10-CM | POA: Insufficient documentation

## 2015-05-24 DIAGNOSIS — R45851 Suicidal ideations: Secondary | ICD-10-CM | POA: Diagnosis present

## 2015-05-24 DIAGNOSIS — G809 Cerebral palsy, unspecified: Secondary | ICD-10-CM | POA: Diagnosis present

## 2015-05-24 DIAGNOSIS — Z79899 Other long term (current) drug therapy: Secondary | ICD-10-CM | POA: Insufficient documentation

## 2015-05-24 DIAGNOSIS — Z7951 Long term (current) use of inhaled steroids: Secondary | ICD-10-CM | POA: Insufficient documentation

## 2015-05-24 DIAGNOSIS — K219 Gastro-esophageal reflux disease without esophagitis: Secondary | ICD-10-CM | POA: Diagnosis present

## 2015-05-24 DIAGNOSIS — F70 Mild intellectual disabilities: Secondary | ICD-10-CM | POA: Diagnosis present

## 2015-05-24 LAB — URINE DRUG SCREEN, QUALITATIVE (ARMC ONLY)
AMPHETAMINES, UR SCREEN: NOT DETECTED
BENZODIAZEPINE, UR SCRN: NOT DETECTED
Barbiturates, Ur Screen: NOT DETECTED
Cannabinoid 50 Ng, Ur ~~LOC~~: NOT DETECTED
Cocaine Metabolite,Ur ~~LOC~~: NOT DETECTED
MDMA (Ecstasy)Ur Screen: NOT DETECTED
METHADONE SCREEN, URINE: NOT DETECTED
OPIATE, UR SCREEN: NOT DETECTED
Phencyclidine (PCP) Ur S: NOT DETECTED
Tricyclic, Ur Screen: POSITIVE — AB

## 2015-05-24 LAB — COMPREHENSIVE METABOLIC PANEL
ALBUMIN: 4.3 g/dL (ref 3.5–5.0)
ALT: 18 U/L (ref 14–54)
ANION GAP: 7 (ref 5–15)
AST: 21 U/L (ref 15–41)
Alkaline Phosphatase: 48 U/L (ref 38–126)
BUN: 21 mg/dL — AB (ref 6–20)
CHLORIDE: 105 mmol/L (ref 101–111)
CO2: 29 mmol/L (ref 22–32)
Calcium: 9.4 mg/dL (ref 8.9–10.3)
Creatinine, Ser: 1 mg/dL (ref 0.44–1.00)
GFR calc Af Amer: >60 mL/min (ref >60)
Glucose, Bld: 88 mg/dL (ref 65–99)
POTASSIUM: 3.8 mmol/L (ref 3.5–5.1)
Sodium: 141 mmol/L (ref 135–145)
Total Bilirubin: 0.3 mg/dL (ref 0.3–1.2)
Total Protein: 7.6 g/dL (ref 6.5–8.1)

## 2015-05-24 LAB — CBC
HEMATOCRIT: 37.7 % (ref 35.0–47.0)
Hemoglobin: 12.7 g/dL (ref 12.0–16.0)
MCH: 31 pg (ref 26.0–34.0)
MCHC: 33.8 g/dL (ref 32.0–36.0)
MCV: 91.8 fL (ref 80.0–100.0)
Platelets: 294 10*3/uL (ref 150–440)
RBC: 4.1 MIL/uL (ref 3.80–5.20)
RDW: 14.2 % (ref 11.5–14.5)
WBC: 7 10*3/uL (ref 3.6–11.0)

## 2015-05-24 LAB — ETHANOL

## 2015-05-24 LAB — ACETAMINOPHEN LEVEL

## 2015-05-24 LAB — SALICYLATE LEVEL: Salicylate Lvl: <4 mg/dL (ref 2.8–30.0)

## 2015-05-24 MED ORDER — ACETAMINOPHEN 500 MG PO TABS
1000.0000 mg | ORAL_TABLET | Freq: Once | ORAL | Status: AC
  Administered 2015-05-24: 1000 mg via ORAL
  Filled 2015-05-24: qty 2

## 2015-05-24 NOTE — ED Notes (Signed)
Report received from [facility]. Patient care assumed. Patient/RN introduction complete. Will continue to monitor.  Pt moved to [facility] 5 per md order.

## 2015-05-24 NOTE — ED Notes (Signed)
Dr.Clapacs at bedside  

## 2015-05-24 NOTE — ED Notes (Signed)
Pt. transfered to BHU without incident after report from. Placed in room and oriented to unit. Pt. informed that for their safety all care areas are designed for safety and monitored by security cameras at all times; and visiting hours explained to patient. Patient verbalizes understanding, and verbal contract for safety obtained.   
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
## 2015-05-24 NOTE — ED Notes (Signed)
Pt to be observed until morning and re-evaluated by psych team and then may be released.
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
## 2015-05-24 NOTE — ED Notes (Signed)

## 2015-05-24 NOTE — ED Notes (Signed)
Pt with co pain all over rates it 8/10 at this time, no distress noted.  Pt awaiting to be revaluated in am and monitored overnight.
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
## 2015-05-24 NOTE — ED Notes (Signed)
Snakes, gram crackers, orange juice, and water given to pt per her request.

## 2015-05-24 NOTE — ED Notes (Signed)
Pt resting in bed eating dinner.

## 2015-05-24 NOTE — ED Notes (Signed)
ED BHU PLACEMENT JUSTIFICATION Is the patient under IVC or is there intent for IVC: No. Is the patient medically cleared: Yes.   Is there vacancy in the ED BHU: Yes.   Is the population mix appropriate for patient: Yes.   Is the patient awaiting placement in inpatient or outpatient setting: Yes.   Has the patient had a psychiatric consult: Yes.   Survey of unit performed for contraband, proper placement and condition of furniture, tampering with fixtures in bathroom, shower, and each patient room: Yes.  ; Findings:  APPEARANCE/BEHAVIOR calm, cooperative and adequate rapport can be established NEURO ASSESSMENT Orientation: time, place and person Hallucinations: No.None noted (Hallucinations) Speech: Normal Gait: normal RESPIRATORY ASSESSMENT Normal expansion.  Clear to auscultation.  No rales, rhonchi, or wheezing. CARDIOVASCULAR ASSESSMENT regular rate and rhythm, S1, S2 normal, no murmur, click, rub or gallop GASTROINTESTINAL ASSESSMENT soft, nontender, BS WNL, no r/g EXTREMITIES normal strength, tone, and muscle mass PLAN OF CARE Provide calm/safe environment. Vital signs assessed twice daily. ED BHU Assessment once each 12-hour shift. Collaborate with intake RN daily or as condition indicates. Assure the ED provider has rounded once each shift. Provide and encourage hygiene. Provide redirection as needed. Assess for escalating behavior; address immediately and inform ED provider.  Assess family dynamic and appropriateness for visitation as needed: Yes.  ; If necessary, describe findings:  Educate the patient/family about BHU procedures/visitation: Yes.  ; If necessary, describe findings:  

## 2015-05-25 ENCOUNTER — Ambulatory Visit: Payer: Self-pay | Admitting: Internal Medicine

## 2015-05-25 DIAGNOSIS — F331 Major depressive disorder, recurrent, moderate: Secondary | ICD-10-CM | POA: Diagnosis not present

## 2015-05-25 LAB — HCG, QUANTITATIVE, PREGNANCY: hCG, Beta Chain, Quant, S: 1 m[IU]/mL (ref <5)

## 2015-05-25 NOTE — Discharge Instructions (Signed)
Major Depressive Disorder Major depressive disorder is a mental illness. It also may be called clinical depression or unipolar depression. Major depressive disorder usually causes feelings of sadness, hopelessness, or helplessness. Some people with this disorder do not feel particularly sad but lose interest in doing things they used to enjoy (anhedonia). Major depressive disorder also can cause physical symptoms. It can interfere with work, school, relationships, and other normal everyday activities. The disorder varies in severity but is longer lasting and more serious than the sadness we all feel from time to time in our lives. Major depressive disorder often is triggered by stressful life events or major life changes. Examples of these triggers include divorce, loss of your job or home, a move, and the death of a family member or close friend. Sometimes this disorder occurs for no obvious reason at all. People who have family members with major depressive disorder or bipolar disorder are at higher risk for developing this disorder, with or without life stressors. Major depressive disorder can occur at any age. It may occur just once in your life (single episode major depressive disorder). It may occur multiple times (recurrent major depressive disorder). SYMPTOMS People with major depressive disorder have either anhedonia or depressed mood on nearly a daily basis for at least 2 weeks or longer. Symptoms of depressed mood include:  Feelings of sadness (blue or down in the dumps) or emptiness.  Feelings of hopelessness or helplessness.  Tearfulness or episodes of crying (may be observed by others).  Irritability (children and adolescents). In addition to depressed mood or anhedonia or both, people with this disorder have at least four of the following symptoms:  Difficulty sleeping or sleeping too much.   Significant change (increase or decrease) in appetite or weight.   Lack of energy or  motivation.  Feelings of guilt and worthlessness.   Difficulty concentrating, remembering, or making decisions.  Unusually slow movement (psychomotor retardation) or restlessness (as observed by others).   Recurrent wishes for death, recurrent thoughts of self-harm (suicide), or a suicide attempt. People with major depressive disorder commonly have persistent negative thoughts about themselves, other people, and the world. People with severe major depressive disorder may experiencedistorted beliefs or perceptions about the world (psychotic delusions). They also may see or hear things that are not real (psychotic hallucinations). DIAGNOSIS Major depressive disorder is diagnosed through an assessment by your health care provider. Your health care provider will ask aboutaspects of your daily life, such as mood,sleep, and appetite, to see if you have the diagnostic symptoms of major depressive disorder. Your health care provider may ask about your medical history and use of alcohol or drugs, including prescription medicines. Your health care provider also may do a physical exam and blood work. This is because certain medical conditions and the use of certain substances can cause major depressive disorder-like symptoms (secondary depression). Your health care provider also may refer you to a mental health specialist for further evaluation and treatment. TREATMENT It is important to recognize the symptoms of major depressive disorder and seek treatment. The following treatments can be prescribed for this disorder:   Medicine. Antidepressant medicines usually are prescribed. Antidepressant medicines are thought to correct chemical imbalances in the brain that are commonly associated with major depressive disorder. Other types of medicine may be added if the symptoms do not respond to antidepressant medicines alone or if psychotic delusions or hallucinations occur.  Talk therapy. Talk therapy can be  helpful in treating major depressive disorder by providing   support, education, and guidance. Certain types of talk therapy also can help with negative thinking (cognitive behavioral therapy) and with relationship issues that trigger this disorder (interpersonal therapy). A mental health specialist can help determine which treatment is best for you. Most people with major depressive disorder do well with a combination of medicine and talk therapy. Treatments involving electrical stimulation of the brain can be used in situations with extremely severe symptoms or when medicine and talk therapy do not work over time. These treatments include electroconvulsive therapy, transcranial magnetic stimulation, and vagal nerve stimulation.   This information is not intended to replace advice given to you by your health care provider. Make sure you discuss any questions you have with your health care provider.   Document Released: 07/29/2012 Document Revised: 04/24/2014 Document Reviewed: 07/29/2012 Elsevier Interactive Patient Education 2016 [employer]vier Inc.  

## 2015-05-25 NOTE — ED Notes (Signed)
Patient resting comfortably in room. No complaints or concerns voiced. No distress or abnormal behavior noted. Will continue to monitor with security cameras. Q 15 minute rounds continue. 

## 2015-05-25 NOTE — ED Notes (Signed)
Patient meeting with MD for re-evaluation.Maintained on 15 minute checks and observation by security camera for safety.

## 2015-05-25 NOTE — ED Notes (Signed)

## 2015-05-25 NOTE — ED Notes (Signed)
Spoke with group home Production designer, theatre/television/film, Burna Mortimer. Patient is to take [employer] home. Arrangements made for cab to pick up patient in ED lobby. Patient has done this before and states she is safe to go home independently.
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
## 2015-05-25 NOTE — ED Notes (Signed)

## 2015-05-25 NOTE — ED Provider Notes (Signed)
-----------------------------------------   10:51 AM on 05/25/2015 -----------------------------------------  Patient has been seen and evaluated by psychiatry. They believe the patient is safe for discharge home at this time. Psychiatry denies any suicidal thoughts at this time. We will discharge the patient. Group home will be accepting the patient back to their facility.  Minna Antis, MD 05/25/15 1052

## 2015-05-25 NOTE — ED Provider Notes (Signed)
-----------------------------------------   7:52 AM on 05/25/2015 -----------------------------------------   Blood pressure 105/71, pulse 77, temperature 98.3 F (36.8 C), temperature source Oral, resp. rate 20, height  (1.626 m), weight 207 lb (93.895 kg), SpO2 98 %.  The patient had no acute events since last update.  Patient has been seen by psychiatry, they have initially stated the patient is safe for discharge home, however after being reevaluated by emergency physician, they believe the patient required longer observation, and the patient will be reevaluated by psychiatry today for appropriate disposition.  Minna Antis, MD 05/25/15 [phone]

## 2015-05-25 NOTE — ED Notes (Signed)
Patient awake, alert, and oriented. Patient states she has occasional suicidal thoughts but no plan. She was educated that having a thought did not mean she was actually going to kill herself but she should talk with somebody about these thoughts and continue to take her medication. If her SI becomes obsessive or she has a plan/intent then she should come to an ED. She agreed that this makes sense. Patient also says she still experiences flashbacks and as above, was educated that this was a part of her illness and she should continue with the prescribed therapeutic treatment.  Maintained on 15 minute checks and observation by security camera for safety.

## 2015-05-25 NOTE — ED Notes (Signed)
Patient discharged back to group home via [employer] cab. She denies SI or HI. Patient received all personal belongings.

## 2015-05-26 ENCOUNTER — Ambulatory Visit: Payer: Self-pay | Admitting: Internal Medicine

## 2015-05-26 ENCOUNTER — Encounter: Payer: Medicare Other | Admitting: Internal Medicine

## 2015-05-26 DIAGNOSIS — L97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 with fat layer exposed: Secondary | ICD-10-CM | POA: Diagnosis not present

## 2015-05-27 ENCOUNTER — Emergency Department
Admission: EM | Admit: 2015-05-27 | Discharge: 2015-05-28 | Disposition: A | Discharge status: Home / Self Care | Payer: Medicare Other | Attending: Emergency Medicine | Admitting: Emergency Medicine

## 2015-05-27 ENCOUNTER — Encounter: Payer: Self-pay | Admitting: *Deleted

## 2015-05-27 DIAGNOSIS — F603 Borderline personality disorder: Secondary | ICD-10-CM | POA: Diagnosis not present

## 2015-05-27 DIAGNOSIS — Z79899 Other long term (current) drug therapy: Secondary | ICD-10-CM | POA: Insufficient documentation

## 2015-05-27 DIAGNOSIS — Z7951 Long term (current) use of inhaled steroids: Secondary | ICD-10-CM | POA: Diagnosis not present

## 2015-05-27 DIAGNOSIS — Z880 Allergy status to penicillin: Secondary | ICD-10-CM | POA: Diagnosis not present

## 2015-05-27 DIAGNOSIS — Z791 Long term (current) use of non-steroidal anti-inflammatories (NSAID): Secondary | ICD-10-CM | POA: Insufficient documentation

## 2015-05-27 DIAGNOSIS — F331 Major depressive disorder, recurrent, moderate: Secondary | ICD-10-CM | POA: Diagnosis not present

## 2015-05-27 DIAGNOSIS — F70 Mild intellectual disabilities: Secondary | ICD-10-CM | POA: Insufficient documentation

## 2015-05-27 DIAGNOSIS — R45851 Suicidal ideations: Secondary | ICD-10-CM | POA: Diagnosis present

## 2015-05-27 LAB — COMPREHENSIVE METABOLIC PANEL
ALT: 17 U/L (ref 14–54)
AST: 18 U/L (ref 15–41)
Albumin: 4 g/dL (ref 3.5–5.0)
Alkaline Phosphatase: 48 U/L (ref 38–126)
Anion gap: 5 (ref 5–15)
BUN: 20 mg/dL (ref 6–20)
CHLORIDE: 107 mmol/L (ref 101–111)
CO2: 30 mmol/L (ref 22–32)
CREATININE: 1.22 mg/dL — AB (ref 0.44–1.00)
Calcium: 9.3 mg/dL (ref 8.9–10.3)
GFR, EST AFRICAN AMERICAN: 59 mL/min — AB (ref >60)
GFR, EST NON AFRICAN AMERICAN: 51 mL/min — AB (ref >60)
Glucose, Bld: 93 mg/dL (ref 65–99)
Potassium: 4 mmol/L (ref 3.5–5.1)
SODIUM: 142 mmol/L (ref 135–145)
Total Bilirubin: 0.5 mg/dL (ref 0.3–1.2)
Total Protein: 7.5 g/dL (ref 6.5–8.1)

## 2015-05-27 LAB — CBC
HEMATOCRIT: 36.9 % (ref 35.0–47.0)
HEMOGLOBIN: 12.6 g/dL (ref 12.0–16.0)
MCH: 31.1 pg (ref 26.0–34.0)
MCHC: 34.1 g/dL (ref 32.0–36.0)
MCV: 91.1 fL (ref 80.0–100.0)
PLATELETS: 283 10*3/uL (ref 150–440)
RBC: 4.05 MIL/uL (ref 3.80–5.20)
RDW: 13.9 % (ref 11.5–14.5)
WBC: 7.3 10*3/uL (ref 3.6–11.0)

## 2015-05-27 LAB — ACETAMINOPHEN LEVEL: Acetaminophen (Tylenol), Serum: <10 ug/mL — ABNORMAL LOW (ref 10–30)

## 2015-05-27 LAB — ETHANOL

## 2015-05-27 LAB — SALICYLATE LEVEL: Salicylate Lvl: <4 mg/dL (ref 2.8–30.0)

## 2015-05-27 NOTE — Progress Notes (Signed)
[name] ([id number]) Visit Report for 05/26/2015 Chief Complaint Document Details Patient Name: [name], [name] Date of Service: 05/26/2015 2:45 PM Medical Record Patient Account Number: [id number] Number: Treating RN: [name] Date of Birth/Sex: January 10, 1966 (50 y.o. Female) Other Clinician: Primary Care [name], Treating [name] Physician: [name] Physician/Extender: [name], Referring Physician: Delmer Green in Green: 18 Information Obtained from: Patient Chief Complaint Right calf ulcer, status post I and D of abscess. Electronic Signature(s) Signed: 05/26/2015 5:51:40 PM By: Sherri Najjar MD Entered By: [name] on 05/26/2015 17:13:43 [name] ([id number]) -------------------------------------------------------------------------------- HPI Details Patient Name: [name] Date of Service: 05/26/2015 2:45 PM Medical Record Patient Account Number: [id number] Number: Treating RN: [name] Date of Birth/Sex: 23-Apr-1965 (50 y.o. Female) Other Clinician: Primary Care [name], Treating [name] Physician: [name] Physician/Extender: [name], Referring Physician: Delmer Green in Green: 18 History of Present Illness HPI Description: Pleasant 50 year old with history of borderline personality disorder and CP. No history of diabetes or peripheral arterial disease. Right ABI 1.2. She developed an ulceration on her right anterior calf secondary to an insect bite. This progressed to an abscess, and she underwent I and D on 01/02/2015 by Dr. Michela Green. Cultures grew oxacillin sensitive staph aureus. She completed a course of clindamycin. She lives in a group home. Punch biopsy 03/24/2015 showed no evidence for malignancy. Negative for granulomatous inflammation and vasculitis. Tolerating 3 layer compression bandage with Sherri alginate 3 times weekly. Epifix #1 applied  04/28/2015. Showing significant improvement recently. She returns to clinic for follow-up and is without new complaints. No significant pain. No fever or chills. Minimal drainage. 05/12/2015 -- here for the third application of a Epifix today. 05/19/15; this is a 50 year old woman who lives in an assisted living. She apparently had a severe episode of cellulitis of the right leg in September. Since then she had a wound on her right anterior leg. Punch biopsy in early December was negative for malignancy, vasculitis. She had her third Epifix place last week. His appears to be doing well. Per the staff no further epifixe planned. The nurse from her facility was with her says there is been tremendous improvement in the condition of this wound, I understand that she was in hospital this week for psychiatric issues nothing to do with the wound Electronic Signature(s) Signed: 05/26/2015 5:51:40 PM By: Sherri Najjar MD Entered By: [name] on 05/26/2015 17:15:02 [name] ([id number]) -------------------------------------------------------------------------------- Physical Exam Details Patient Name: [name] Date of Service: 05/26/2015 2:45 PM Medical Record Patient Account Number: [id number] Number: Treating RN: [name] Date of Birth/Sex: 11/11/1965 (50 y.o. Female) Other Clinician: Primary Care [name], Treating [name] Physician: [name] Physician/Extender: [name], Referring Physician: Delmer Green in Green: 18 Notes Wound exam; healthy-looking wound on the right anterior lower leg. There is healthy granulation. No debridement was required. There appears to be a small thrombosed superficial vein just below the wound. I did not disturb this today Electronic Signature(s) Signed: 05/26/2015 5:51:40 PM By: Sherri Najjar MD Entered By: [name] on 05/26/2015 17:16:18 [name]  ([id number]) -------------------------------------------------------------------------------- Physician Orders Details Patient Name: [name] Date of Service: 05/26/2015 2:45 PM Medical Record Patient Account Number: [id number] Number: Treating RN: [name] Date of Birth/Sex: 1966-03-31 (50 y.o. Female) Other Clinician: Primary Care [name], Treating Aking Klabunde Physician: [name] Physician/Extender: [name], Referring Physician: Delmer Green in Green: 58 Verbal / Phone Orders: Yes Clinician: Curtis Green Read Back and Verified: Yes Diagnosis Coding Wound Cleansing Wound #1 Right,Medial Lower  Leg o Cleanse wound with mild soap and water Anesthetic Wound #1 Right,Medial Lower Leg o Topical Lidocaine 4% cream applied to wound bed prior to debridement Skin Barriers/Peri-Wound Care Wound #1 Right,Medial Lower Leg o Skin Prep Primary Wound Dressing Wound #1 Right,Medial Lower Leg o Promogran Secondary Dressing Wound #1 Right,Medial Lower Leg o ABD pad Dressing Change Frequency Wound #1 Right,Medial Lower Leg o Change dressing every week Follow-up Appointments Wound #1 Right,Medial Lower Leg o Return Appointment in 1 week. Edema Control Wound #1 Right,Medial Lower Leg o 3 Layer Compression System - Right Lower Extremity - unna wrap at the top to anchor o Elevate legs to the level of the heart and pump ankles as often as possible [name], [name] ([id number]) Electronic Signature(s) Signed: 05/26/2015 5:20:55 PM By: [name] Signed: 05/26/2015 5:51:40 PM By: Sherri Najjar MD Entered By: [name] on 05/26/2015 15:21:06 [name] ([id number]) -------------------------------------------------------------------------------- Problem List Details Patient Name: [name] Date of Service: 05/26/2015 2:45 PM Medical Record Patient Account Number: [id number] Number: Treating RN:  [name] Date of Birth/Sex: November 08, 1965 (50 y.o. 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 S81.801S Unspecified open wound, right lower leg, sequela 01/20/2015 Yes F60.3 Borderline personality disorder 01/20/2015 Yes Inactive Problems Resolved Problems ICD-10 Code Description Active Date Resolved Date L03.115 Cellulitis of right lower limb 04/07/2015 04/07/2015 Electronic Signature(s) Signed: 05/26/2015 5:51:40 PM By: Sherri Najjar MD Entered By: [name] on 05/26/2015 17:13:31 [name] ([id number]) -------------------------------------------------------------------------------- Progress Note Details Patient Name: [name] Date of Service: 05/26/2015 2:45 PM Medical Record Patient Account Number: [id number] Number: Treating RN: [name] Date of Birth/Sex: February 19, 1966 (50 y.o. Female) Other Clinician: Primary Care [name], Treating Amman Bartel Physician: [name] Physician/Extender: [name], Referring Physician: Delmer Green in Green: 18 Subjective Chief Complaint Information obtained from Patient Right calf ulcer, status post I and D of abscess. History of Present Illness (HPI) Pleasant 50 year old with history of borderline personality disorder and CP. No history of diabetes or peripheral arterial disease. Right ABI 1.2. She developed an ulceration on her right anterior calf secondary to an insect bite. This progressed to an abscess, and she underwent I and D on 01/02/2015 by Dr. Michela Green. Cultures grew oxacillin sensitive staph aureus. She completed a course of clindamycin. She lives in a group home. Punch  biopsy 03/24/2015 showed no evidence for malignancy. Negative for granulomatous inflammation and vasculitis. Tolerating 3 layer compression bandage with Sherri alginate 3 times weekly. Epifix #1 applied 04/28/2015. Showing significant improvement recently. She returns to clinic for follow-up and is without new complaints. No significant pain. No fever or chills. Minimal drainage. 05/12/2015 -- here for the third application of a Epifix today. 05/19/15; this is a 50 year old woman who lives in an assisted living. She apparently had a severe episode of cellulitis of the right leg in September. Since then she had a wound on her right anterior leg. Punch biopsy in early December was negative for malignancy, vasculitis. She had her third Epifix place last week. His appears to be doing well. Per the staff no further epifixe planned. The nurse from her facility was with her says there is been tremendous improvement in the condition of this wound, I understand that she was in hospital this week for psychiatric issues nothing to do with the wound  Objective Constitutional Vitals Time Taken: 2:58 PM, Height: 64 in, Weight: 215 lbs, BMI: 36.9, Temperature: 98.3 F, Pulse: 92 bpm, Respiratory Rate: 20 breaths/min, Blood Pressure: 135/94 mmHg. SHANDRICKA, MONROY ([id number]) Integumentary (Hair, Skin) Wound #1 status is Open. Original cause of wound was Bite. The wound is located on the Right,Medial Lower Leg. The wound measures 0.7cm length x 0.5cm width x 0.1cm depth; 0.275cm^2 area and 0.027cm^3 volume. The wound is limited to skin breakdown. There is no tunneling or undermining noted. There is a large amount of serosanguineous drainage noted. The wound margin is flat and intact. There is large (67-100%) red granulation within the wound bed. There is no necrotic tissue within the wound bed. The periwound skin appearance exhibited: Localized Edema, Scarring, Moist. The periwound skin appearance did not  exhibit: Callus, Crepitus, Excoriation, Fluctuance, Friable, Induration, Rash, Dry/Scaly, Maceration, Atrophie Blanche, Cyanosis, Ecchymosis, Hemosiderin Staining, Mottled, Pallor, Rubor, Erythema. Periwound temperature was noted as No Abnormality. Assessment Active Problems ICD-10 L97.212 - Non-pressure chronic ulcer of right calf with fat layer exposed S81.801S - Unspecified open wound, right lower leg, sequela F60.3 - Borderline personality disorder Plan Wound Cleansing: Wound #1 Right,Medial Lower Leg: Cleanse wound with mild soap and water Anesthetic: Wound #1 Right,Medial Lower Leg: Topical Lidocaine 4% cream applied to wound bed prior to debridement Skin Barriers/Peri-Wound Care: Wound #1 Right,Medial Lower Leg: Skin Prep Primary Wound Dressing: Wound #1 Right,Medial Lower Leg: Promogran Secondary Dressing: Wound #1 Right,Medial Lower Leg: ABD pad Dressing Change Frequency: Wound #1 Right,Medial Lower Leg: Change dressing every week DERINDA, BARTUS ([id number]) Follow-up Appointments: Wound #1 Right,Medial Lower Leg: Return Appointment in 1 week. Edema Control: Wound #1 Right,Medial Lower Leg: 3 Layer Compression System - Right Lower Extremity - unna wrap at the top to anchor Elevate legs to the level of the heart and pump ankles as often as possible #1 We will continue collagen under a profore lite wrap.I asked for additional compression over the wound with an ABD pad Electronic Signature(s) Signed: 05/26/2015 5:51:40 PM By: Sherri Najjar MD Entered By: [name] on 05/26/2015 17:19:08 [name] ([id number]) -------------------------------------------------------------------------------- SuperBill Details Patient Name: [name] Date of Service: 05/26/2015 Medical Record Patient Account Number: [id number] Number: Treating RN: [name] Date of Birth/Sex: [birth year] (50 y.o. Female) Other Clinician: Primary Care  [name], Treating Lu Paradise Physician: [name] Physician/Extender: [name], Weeks in Green: 18 Referring Physician: TRACY Diagnosis Coding ICD-10 Codes Code Description [phone] Non-pressure chronic ulcer of right calf with fat layer exposed S81.801S Unspecified open wound, right lower leg, sequela F60.3 Borderline personality disorder Facility Procedures CPT4: Description Modifier Quantity Code [id number] (Facility Use Only) [phone] - APPLY MULTLAY COMPRS LWR RT 1 LEG Physician Procedures CPT4 Code: [id number] Description: 36644 - WC PHYS LEVEL 2 - EST PT ICD-10 Description Diagnosis L97.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 with fat Modifier: layer exposed Quantity: 1 Electronic Signature(s) Signed: 05/26/2015 5:51:40 PM By: Sherri Najjar MD Previous Signature: 05/26/2015 4:28:40 PM Version By: [name] Entered By: [name] on 05/26/2015 17:19:48

## 2015-05-27 NOTE — ED Notes (Signed)
Pt unable to void at this time. 

## 2015-05-27 NOTE — ED Notes (Signed)
Pt has dermaplast on right lower leg for 4 months.  Pt has healing wound to right lower leg.

## 2015-05-27 NOTE — ED Notes (Signed)
Pt brought in via ems from group home.  Pt reports the staff fussed at her yesterday and now pt wants to hurt herself.  Pt calm and cooperative.

## 2015-05-27 NOTE — Progress Notes (Signed)
[name], [name] ([id number]) Visit Report for 05/26/2015 Arrival Information Details Patient Name: [name], [name] Date of Service: 05/26/2015 2:45 PM Medical Record Patient Account Number: [id number] Number: Treating RN: Curtis Sites Date of Birth/Sex: Mar 16, 1966 (50 y.o. Female) Other Clinician: Primary Care MCLEAN-SCOCOZZA, Treating ROBSON, MICHAEL Physician: French Ana Physician/Extender: Fabiola Backer, Referring Physician: Delmer Islam in Treatment: 18 Visit Information History Since Last Visit Added or deleted any medications: No Patient Arrived: Ambulatory Any new allergies or adverse reactions: No Arrival Time: 14:56 Had a fall or experienced change in No Accompanied By: cg activities of daily living that may affect Transfer Assistance: None risk of falls: Patient Identification Verified: Yes Signs or symptoms of abuse/neglect since last No Secondary Verification Process Yes visito Completed: Hospitalized since last visit: No Patient Requires Transmission-Based No Pain Present Now: No Precautions: Patient Has Alerts: Yes Patient Alerts: aspirin Electronic Signature(s) Signed: 05/26/2015 5:20:55 PM By: Curtis Sites Entered By: Curtis Sites on 05/26/2015 14:58:30 [name] ([id number]) -------------------------------------------------------------------------------- Encounter Discharge Information Details Patient Name: [name] Date of Service: 05/26/2015 2:45 PM Medical Record Patient Account Number: [id number] Number: Treating RN: Curtis Sites Date of Birth/Sex: 1965/08/05 (50 y.o. Female) Other Clinician: Primary Care MCLEAN-SCOCOZZA, Treating ROBSON, MICHAEL Physician: French Ana Physician/Extender: Fabiola Backer, Referring Physician: Delmer Islam in Treatment: 23 Encounter Discharge Information Items Discharge Pain Level: 0 Discharge Condition: Stable Ambulatory Status: Ambulatory Discharge Destination:  Home Transportation: Private Auto Accompanied By: cg Schedule Follow-up Appointment: Yes Medication Reconciliation completed and provided to Patient/Care No Brenleigh Collet: Provided on Clinical Summary of Care: 05/26/2015 Form Type Recipient Paper Patient [place] Electronic Signature(s) Signed: 05/26/2015 4:29:30 PM By: Curtis Sites Previous Signature: 05/26/2015 3:34:41 PM Version By: Gwenlyn Perking Entered By: Curtis Sites on 05/26/2015 16:29:30 [name] ([id number]) -------------------------------------------------------------------------------- Lower Extremity Assessment Details Patient Name: [name] Date of Service: 05/26/2015 2:45 PM Medical Record Patient Account Number: [id number] Number: Treating RN: Curtis Sites Date of Birth/Sex: 26-Dec-1965 (50 y.o. Female) Other Clinician: Primary Care MCLEAN-SCOCOZZA, Treating ROBSON, MICHAEL Physician: French Ana Physician/Extender: Fabiola Backer, Referring Physician: Delmer Islam in Treatment: 18 Edema Assessment Assessed: [Left: No] [Right: No] Edema: [Left: Ye] [Right: s] Calf Left: Right: Point of Measurement: cm From Medial Instep cm 35.8 cm Ankle Left: Right: Point of Measurement: cm From Medial Instep cm 21.1 cm Vascular Assessment Pulses: Posterior Tibial Dorsalis Pedis Palpable: [Right:Yes] Extremity colors, hair growth, and conditions: Extremity Color: [Right:Normal] Hair Growth on Extremity: [Right:Yes] Temperature of Extremity: [Right:Warm] Capillary Refill: [Right:< 3 seconds] Electronic Signature(s) Signed: 05/26/2015 5:20:55 PM By: Curtis Sites Entered By: Curtis Sites on 05/26/2015 15:10:25 [name] ([id number]) -------------------------------------------------------------------------------- Multi Wound Chart Details Patient Name: [name] Date of Service: 05/26/2015 2:45 PM Medical Record Patient Account Number: [id number] Number: Treating RN: Curtis Sites Date of Birth/Sex: [birth year] (50 y.o. Female) Other Clinician: Primary Care MCLEAN-SCOCOZZA, Treating ROBSON, MICHAEL Physician: French Ana Physician/Extender: Fabiola Backer, Referring Physician: Delmer Islam in Treatment: 18 Vital Signs Height(in): 64 Pulse(bpm): 92 Weight(lbs): 215 Blood Pressure 135/94 (mmHg): Body Mass Index(BMI): 37 Temperature(F): 98.3 Respiratory Rate 20 (breaths/min): Photos: [1:No Photos] [N/A:N/A] Wound Location: [1:Right Lower Leg - Medial] [N/A:N/A] Wounding Event: [1:Bite] [N/A:N/A] Primary Etiology: [1:Infection - not elsewhere classified] [N/A:N/A] Date Acquired: [1:12/28/2014] [N/A:N/A] Weeks of Treatment: [1:18] [N/A:N/A] Wound Status: [1:Open] [N/A:N/A] Measurements L x W x D 0.7x0.5x0.1 [N/A:N/A] (cm) Area (cm) : [1:0.275] [N/A:N/A] Volume (cm) : [1:0.027] [N/A:N/A] % Reduction in Area: [1:95.30%] [N/A:N/A] % Reduction in Volume: 95.40% [N/A:N/A] Classification: [1:Full Thickness Without Exposed Support Structures] [N/A:N/A] Exudate Amount: [1:Large] [N/A:N/A] Exudate Type: [  1:Serosanguineous] [N/A:N/A] Exudate Color: [1:red, brown] [N/A:N/A] Wound Margin: [1:Flat and Intact] [N/A:N/A] Granulation Amount: [1:Large (67-100%)] [N/A:N/A] Granulation Quality: [1:Red] [N/A:N/A] Necrotic Amount: [1:None Present (0%)] [N/A:N/A] Exposed Structures: [1:Fascia: No Fat: No Tendon: No] [N/A:N/A] Muscle: No Joint: No Bone: No Limited to Skin Breakdown Epithelialization: Medium (34-66%) N/A N/A Periwound Skin Texture: Edema: Yes N/A N/A Scarring: Yes Excoriation: No Induration: No Callus: No Crepitus: No Fluctuance: No Friable: No Rash: No Periwound Skin Moist: Yes N/A N/A Moisture: Maceration: No Dry/Scaly: No Periwound Skin Color: Atrophie Blanche: No N/A N/A Cyanosis: No Ecchymosis: No Erythema: No Hemosiderin Staining: No Mottled: No Pallor: No Rubor: No Temperature: No Abnormality N/A N/A Tenderness on No N/A  N/A Palpation: Wound Preparation: Ulcer Cleansing: Other: N/A N/A soap and water Topical Anesthetic Applied: Other: lidocaine 4% Treatment Notes Electronic Signature(s) Signed: 05/26/2015 5:20:55 PM By: Curtis Sites Entered By: Curtis Sites on 05/26/2015 15:10:41 [name] ([id number]) -------------------------------------------------------------------------------- Multi-Disciplinary Care Plan Details Patient Name: [name] Date of Service: 05/26/2015 2:45 PM Medical Record Patient Account Number: [id number] Number: Treating RN: Curtis Sites Date of Birth/Sex: Apr 14, 1966 (50 y.o. Female) Other Clinician: Primary Care MCLEAN-SCOCOZZA, Treating ROBSON, MICHAEL Physician: French Ana Physician/Extender: Fabiola Backer, Referring Physician: Delmer Islam in Treatment: 59 Active Inactive Orientation to the Wound Care Program Nursing Diagnoses: Knowledge deficit related to the wound healing center program Goals: Patient/caregiver will verbalize understanding of the Wound Healing Center Program Date Initiated: 01/20/2015 Goal Status: Active Interventions: Provide education on orientation to the wound center Notes: Soft Tissue Infection Nursing Diagnoses: Potential for infection: soft tissue Goals: Patient will remain free of wound infection Date Initiated: 01/20/2015 Goal Status: Active Interventions: Assess signs and symptoms of infection every visit Treatment Activities: Culture and sensitivity : 05/26/2015 Notes: Wound/Skin Impairment [name], [name] ([id number]) Nursing Diagnoses: Impaired tissue integrity Goals: Ulcer/skin breakdown will heal within 14 weeks Date Initiated: 01/20/2015 Goal Status: Active Interventions: Assess patient/caregiver ability to obtain necessary supplies Notes: Electronic Signature(s) Signed: 05/26/2015 5:20:55 PM By: Curtis Sites Entered By: Curtis Sites on 05/26/2015 15:10:32 [name]  ([id number]) -------------------------------------------------------------------------------- Patient/Caregiver Education Details Patient Name: [name] Date of Service: 05/26/2015 2:45 PM Medical Record Patient Account Number: [id number] Number: Treating RN: Curtis Sites Date of Birth/Gender: 1966-02-14 (50 y.o. Female) Other Clinician: Primary Care MCLEAN-SCOCOZZA, Treating ROBSON, MICHAEL Physician: French Ana Physician/Extender: Fabiola Backer, Weeks in Treatment: 18 Referring Physician: TRACY Education Assessment Education Provided To: Patient Education Topics Provided Venous: Handouts: Other: keep wrap in place Methods: Explain/Verbal Responses: State content correctly Electronic Signature(s) Signed: 05/26/2015 4:29:48 PM By: Curtis Sites Entered By: Curtis Sites on 05/26/2015 16:29:47 [name] ([id number]) -------------------------------------------------------------------------------- Wound Assessment Details Patient Name: [name] Date of Service: 05/26/2015 2:45 PM Medical Record Patient Account Number: [id number] Number: Treating RN: Curtis Sites Date of Birth/Sex: 02/21/1966 (50 y.o. Female) Other Clinician: Primary Care MCLEAN-SCOCOZZA, Treating ROBSON, MICHAEL Physician: French Ana Physician/Extender: Fabiola Backer, Referring Physician: Delmer Islam in Treatment: 18 Wound Status Wound Number: 1 Primary Etiology: Infection - not elsewhere classified Wound Location: Right Lower Leg - Medial Wound Status: Open Wounding Event: Bite Date Acquired: 12/28/2014 Weeks Of Treatment: 18 Clustered Wound: No Photos Photo Uploaded By: Curtis Sites on 05/26/2015 16:47:56 Wound Measurements Length: (cm) 0.7 Width: (cm) 0.5 Depth: (cm) 0.1 Area: (cm) 0.275 Volume: (cm) 0.027 % Reduction in Area: 95.3% % Reduction in Volume: 95.4% Epithelialization: Medium (34-66%) Tunneling: No Undermining: No Wound  Description Full Thickness Without Exposed Classification: Support Structures Wound Margin: Flat and Intact Exudate Large Amount: Exudate Type: Serosanguineous Exudate Color:  red, brown Foul Odor After Cleansing: No Wound Bed [name], [name] ([id number]) Granulation Amount: Large (67-100%) Exposed Structure Granulation Quality: Red Fascia Exposed: No Necrotic Amount: None Present (0%) Fat Layer Exposed: No Tendon Exposed: No Muscle Exposed: No Joint Exposed: No Bone Exposed: No Limited to Skin Breakdown Periwound Skin Texture Texture Color No Abnormalities Noted: No No Abnormalities Noted: No Callus: No Atrophie Blanche: No Crepitus: No Cyanosis: No Excoriation: No Ecchymosis: No Fluctuance: No Erythema: No Friable: No Hemosiderin Staining: No Induration: No Mottled: No Localized Edema: Yes Pallor: No Rash: No Rubor: No Scarring: Yes Temperature / Pain Moisture Temperature: No Abnormality No Abnormalities Noted: No Dry / Scaly: No Maceration: No Moist: Yes Wound Preparation Ulcer Cleansing: Other: soap and water, Topical Anesthetic Applied: Other: lidocaine 4%, Treatment Notes Wound #1 (Right, Medial Lower Leg) 1. Cleansed with: Cleanse wound with antibacterial soap and water 2. Anesthetic Topical Lidocaine 4% cream to wound bed prior to debridement 4. Dressing Applied: Promogran 5. Secondary Dressing Applied ABD Pad 7. Secured with 3 Layer Compression System - Right Lower Extremity Notes unna to anchor [name], [name] ([id number]) Electronic Signature(s) Signed: 05/26/2015 5:20:55 PM By: Curtis Sites Entered By: Curtis Sites on 05/26/2015 15:10:02 [name] ([id number]) -------------------------------------------------------------------------------- Vitals Details Patient Name: [name] Date of Service: 05/26/2015 2:45 PM Medical Record Patient Account Number: [id number] Number: Treating RN: Curtis Sites Date of Birth/Sex: March 04, 1966 (50 y.o. Female) Other Clinician: Primary Care MCLEAN-SCOCOZZA, Treating ROBSON, MICHAEL Physician: French Ana Physician/Extender: Fabiola Backer, Referring Physician: Delmer Islam in Treatment: 18 Vital Signs Time Taken: 14:58 Temperature (F): 98.3 Height (in): 64 Pulse (bpm): 92 Weight (lbs): 215 Respiratory Rate (breaths/min): 20 Body Mass Index (BMI): 36.9 Blood Pressure (mmHg): 135/94 Reference Range: 80 - 120 mg / dl Electronic Signature(s) Signed: 05/26/2015 5:20:55 PM By: Curtis Sites Entered By: Curtis Sites on 05/26/2015 14:59:03

## 2015-05-27 NOTE — ED Notes (Signed)
TTS at bedside at this time.  

## 2015-05-28 NOTE — ED Notes (Signed)
Pt given back all belongings at this time. Getting dressed at this time, states all belongings she came in with present. Pt in NAD at this time. Pt agitated that she must go back to group home.

## 2015-05-28 NOTE — ED Notes (Signed)
Pt given meal tray at this time while d/c is in progress. Pt made aware and verbalized understanding at this time

## 2015-05-28 NOTE — ED Provider Notes (Signed)
[facility] Emergency Department Provider Note  ____________________________________________  Time seen: Approximately 12:10 AM  I have reviewed the triage vital signs and the nursing notes.   HISTORY  Chief Complaint Suicidal  History is somewhat limited by the patient's chronic cognitive disability.  HPI [name] is a 50 y.o. female who is well-known to the emergency department and psychiatry team at [facility] due to her.  It visits for major depressive disorder, suicidal ideation, and borderline personality disorder.  She has presented multiple times this week with the same complaint; she has a disagreement with someone at her group home and reports that she feels suicidal and is brought for evaluation.  She was observed for a couple of days in the emergency department and discharged 2 days ago after 2 different psychiatrists evaluated her and felt that she was safe for discharge.    Tonight she was brought in because she says that the staff at the group home.  Her yesterday and she wants to hurt herself.  She is calm, cooperative, and in no acute distress.  She denies any pain, nausea, vomiting, diarrhea, dysuria.  She states that her symptoms are severe and that nothing is making it better and that being fussed that makes it worse.He has no specific plan and is vague about her intentions of self harm.   Past Medical History  Diagnosis Date  . Asthma   . Anxiety   . GERD (gastroesophageal reflux disease)   . Mild cognitive impairment   . Cerebral palsy (HCC)   . Borderline personality disorder   . Depression   . Wound abscess   . Schizoaffective disorder [facility])     Patient Active Problem List   Diagnosis Date Noted  . Anxiety 12/31/2014  . Cellulitis and abscess of leg 12/31/2014  . 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 GERD (gastroesophageal reflux disease) 09/24/2014  . COPD (chronic obstructive pulmonary disease) (HCC)  09/24/2014  . Borderline personality disorder 09/24/2014  . Major depressive disorder, recurrent episode, moderate (HCC) 09/24/2014  . Cerebral palsy (HCC) 08/31/2014    Past Surgical History  Procedure Laterality Date  . Cholecystectomy    . Tonsillectomy    . Incision and drainage abscess Right 01/02/2015    Procedure: INCISION AND DRAINAGE ABSCESS;  Surgeon: Tiney Rouge III, MD;  Location: [facility] ORS;  Service: General;  Laterality: Right;    Current Outpatient Rx  Name  Route  Sig  Dispense  Refill  . acetaminophen (TYLENOL) 500 MG tablet   Oral   Take 500 mg by mouth every 4 (four) hours as needed for mild pain, moderate pain, fever or headache.         . ARIPiprazole (ABILIFY) 10 MG tablet   Oral   Take 1 tablet (10 mg total) by mouth daily.   30 tablet   0   . azithromycin (ZITHROMAX Z-PAK) 250 MG tablet      Take 2 tablets (500 mg) on  Day 1,  followed by 1 tablet (250 mg) once daily on Days 2 through 5.   6 each   0   . diphenoxylate-atropine (LOMOTIL) 2.5-0.025 MG per tablet   Oral   Take 1 tablet by mouth every 6 (six) hours as needed for diarrhea or loose stools.         Marland Kitchen escitalopram (LEXAPRO) 20 MG tablet   Oral   Take 1 tablet (20 mg total) by mouth at bedtime.   30 tablet   0   .  fenofibrate 54 MG tablet   Oral   Take 54 mg by mouth daily.         . Fluticasone-Salmeterol (ADVAIR) 100-50 MCG/DOSE AEPB   Inhalation   Inhale 1 puff into the lungs every 12 (twelve) hours.          Marland Kitchen guaiFENesin-codeine 100-10 MG/5ML syrup   Oral   Take 10 mLs by mouth every 4 (four) hours as needed for cough.   180 mL   0   . ibuprofen (ADVIL,MOTRIN) 800 MG tablet   Oral   Take 1 tablet (800 mg total) by mouth every 8 (eight) hours as needed.   30 tablet   0   . ketoconazole (NIZORAL) 2 % cream   Topical   Apply 1 application topically 2 (two) times daily as needed for irritation. To breast and urinal areas         . meloxicam (MOBIC) 15 MG tablet    Oral   Take 15 mg by mouth daily.         . miconazole (ZEASORB-AF) 2 % powder   Topical   Apply 1 application topically 2 (two) times daily.         . pantoprazole (PROTONIX) 40 MG tablet   Oral   Take 40 mg by mouth daily.         . traMADol (ULTRAM) 50 MG tablet   Oral   Take 50 mg by mouth 2 (two) times daily as needed for moderate pain or severe pain.           Allergies Penicillins  Family History  Problem Relation Age of Onset  . Diabetes Mother   . Heart attack Father     Social History Social History  Substance Use Topics  . Smoking status: Never Smoker   . Smokeless tobacco: Never Used  . Alcohol Use: No    Review of Systems Constitutional: No fever/chills Eyes: No visual changes. ENT: No sore throat. Cardiovascular: Denies chest pain. Respiratory: Denies shortness of breath. Gastrointestinal: No abdominal pain.  No nausea, no vomiting.  No diarrhea.  No constipation. Genitourinary: Negative for dysuria. Musculoskeletal: Negative for back pain. Skin: Negative for rash. Neurological: Negative for headaches, focal weakness or numbness. Psychiatric: The patient is calm and cooperative with a baseline level of dysarthria due to her cerebral palsy.  She endorses being sad at being fussed at by the nursing home staff and expresses a vague desire to hurt herself because she wants to be at a different facility.  She has no specific plan. 10-point ROS otherwise negative.  ____________________________________________   PHYSICAL EXAM:  VITAL SIGNS: ED Triage Vitals  Enc Vitals Group     BP 05/27/15 2100 127/75 mmHg     Pulse Rate 05/27/15 2100 99     Resp 05/27/15 2100 20     Temp 05/27/15 2100 98.7 F (37.1 C)     Temp Source 05/27/15 2100 Oral     SpO2 05/27/15 2100 99 %     Weight 05/27/15 2100 207 lb (93.895 kg)     Height 05/27/15 2100 5\' 4"  (1.626 m)     Head Cir --      Peak Flow --      Pain Score 05/27/15 2101 10     Pain Loc --       Pain Edu? --      Excl. in GC? --     Constitutional: Alert and oriented. Well appearing and in no acute  distress. Eyes: Conjunctivae are normal. PERRL. EOMI. Head: Atraumatic. Nose: No congestion/rhinnorhea. Neck: No stridor.   Cardiovascular: Normal rate, regular rhythm. Grossly normal heart sounds.  Good peripheral circulation. Respiratory: Normal respiratory effort.  No retractions. Lungs CTAB. Gastrointestinal: Soft and nontender. No distention. No abdominal bruits. No CVA tenderness. Musculoskeletal: No lower extremity tenderness nor edema.  No joint effusions. Neurologic:  Normal speech and language. No gross focal neurologic deficits are appreciated.  Skin:  Skin is warm, dry and intact. No rash noted. Psychiatric: Mood and affect are at baseline for her.  Expresses sadness and vague intention of self-harm.  ____________________________________________   LABS (all labs ordered are listed, but only abnormal results are displayed)  Labs Reviewed  COMPREHENSIVE METABOLIC PANEL - Abnormal; Notable for the following:    Creatinine, Ser 1.22 (*)    GFR calc non Af Amer 51 (*)    GFR calc Af Amer 59 (*)    All other components within normal limits  ACETAMINOPHEN LEVEL - Abnormal; Notable for the following:    Acetaminophen (Tylenol), Serum <10 (*)    All other components within normal limits  ETHANOL  SALICYLATE LEVEL  CBC  URINE DRUG SCREEN, QUALITATIVE ([facility] ONLY)   ____________________________________________  EKG  None ____________________________________________  RADIOLOGY   No results found.  ____________________________________________   PROCEDURES  Procedure(s) performed: None  Critical Care performed: No ____________________________________________   INITIAL IMPRESSION / ASSESSMENT AND PLAN / ED COURSE  Pertinent labs & imaging results that were available during my care of the patient were reviewed by me and considered in my medical decision  making (see chart for details).  The patient and I had a long talk.  I explained that I understand how frustrating can be at her group home and that she will not always get along with people, but that is not a reason to come into the hospital.  I reminded her she has seen multiple psychiatrists recently and all of them declined to admit her.  She asked if we could please transfer to [facility] or another inpatient facility because she does not like her group home.  I explained that that is not possible and that it is important that she do her best to get along with the group home staff.  She agreed that she does not want to hurt herself, she just gets frustrated when people "fuss at me".  She also asked if we could place her in a different group home, but I again reminded her that we cannot do that from the emergency department and that she needs to work with her outpatient doctor.  I do not believe she would benefit from another lengthy stay in the emergency department or another psychiatric evaluation.  She is calm and cooperative and although she has a long history of manipulative behavior she is being reasonable after our talk at this time.  She agrees to go back to the group home and follow up with her outpatient psychiatrist.  I do not believe that she represents an immediate threat to herself or anyone else and she does not meet criteria for involuntary commitment or inpatient psychiatric admission.  ____________________________________________  FINAL CLINICAL IMPRESSION(S) / ED DIAGNOSES  Final diagnoses:  Major depressive disorder, recurrent episode, moderate (HCC)  Borderline personality disorder  Mild intellectual disability      NEW MEDICATIONS STARTED DURING THIS VISIT:  New Prescriptions   No medications on file     Loleta Rose, MD 05/28/15 [phone]

## 2015-05-28 NOTE — Discharge Instructions (Signed)
You have been seen in the Emergency Department (ED) today for a psychiatric complaint.  You have been evaluated and we believe you are safe to be discharged from the hospital.    Please return to the ED immediately if you have ANY thoughts of hurting yourself or anyone else, so that we may help you.  Please avoid alcohol and drug use.  Follow up with your doctor and/or therapist as soon as possible regarding today's ED visit.   Please follow up any other recommendations and clinic appointments provided by the psychiatry team that saw you in the Emergency Department.   Major Depressive Disorder Major depressive disorder is a mental illness. It also may be called clinical depression or unipolar depression. Major depressive disorder usually causes feelings of sadness, hopelessness, or helplessness. Some people with this disorder do not feel particularly sad but lose interest in doing things they used to enjoy (anhedonia). Major depressive disorder also can cause physical symptoms. It can interfere with work, school, relationships, and other normal everyday activities. The disorder varies in severity but is longer lasting and more serious than the sadness we all feel from time to time in our lives. Major depressive disorder often is triggered by stressful life events or major life changes. Examples of these triggers include divorce, loss of your job or home, a move, and the death of a family member or close friend. Sometimes this disorder occurs for no obvious reason at all. People who have family members with major depressive disorder or bipolar disorder are at higher risk for developing this disorder, with or without life stressors. Major depressive disorder can occur at any age. It may occur just once in your life (single episode major depressive disorder). It may occur multiple times (recurrent major depressive disorder). SYMPTOMS People with major depressive disorder have either anhedonia or depressed  mood on nearly a daily basis for at least 2 weeks or longer. Symptoms of depressed mood include:  Feelings of sadness (blue or down in the dumps) or emptiness.  Feelings of hopelessness or helplessness.  Tearfulness or episodes of crying (may be observed by others).  Irritability (children and adolescents). In addition to depressed mood or anhedonia or both, people with this disorder have at least four of the following symptoms:  Difficulty sleeping or sleeping too much.   Significant change (increase or decrease) in appetite or weight.   Lack of energy or motivation.  Feelings of guilt and worthlessness.   Difficulty concentrating, remembering, or making decisions.  Unusually slow movement (psychomotor retardation) or restlessness (as observed by others).   Recurrent wishes for death, recurrent thoughts of self-harm (suicide), or a suicide attempt. People with major depressive disorder commonly have persistent negative thoughts about themselves, other people, and the world. People with severe major depressive disorder may experiencedistorted beliefs or perceptions about the world (psychotic delusions). They also may see or hear things that are not real (psychotic hallucinations). DIAGNOSIS Major depressive disorder is diagnosed through an assessment by your health care provider. Your health care provider will ask aboutaspects of your daily life, such as mood,sleep, and appetite, to see if you have the diagnostic symptoms of major depressive disorder. Your health care provider may ask about your medical history and use of alcohol or drugs, including prescription medicines. Your health care provider also may do a physical exam and blood work. This is because certain medical conditions and the use of certain substances can cause major depressive disorder-like symptoms (secondary depression). Your health care provider  also may refer you to a mental health specialist for further  evaluation and treatment. TREATMENT It is important to recognize the symptoms of major depressive disorder and seek treatment. The following treatments can be prescribed for this disorder:   Medicine. Antidepressant medicines usually are prescribed. Antidepressant medicines are thought to correct chemical imbalances in the brain that are commonly associated with major depressive disorder. Other types of medicine may be added if the symptoms do not respond to antidepressant medicines alone or if psychotic delusions or hallucinations occur.  Talk therapy. Talk therapy can be helpful in treating major depressive disorder by providing support, education, and guidance. Certain types of talk therapy also can help with negative thinking (cognitive behavioral therapy) and with relationship issues that trigger this disorder (interpersonal therapy). A mental health specialist can help determine which treatment is best for you. Most people with major depressive disorder do well with a combination of medicine and talk therapy. Treatments involving electrical stimulation of the brain can be used in situations with extremely severe symptoms or when medicine and talk therapy do not work over time. These treatments include electroconvulsive therapy, transcranial magnetic stimulation, and vagal nerve stimulation.   This information is not intended to replace advice given to you by your health care provider. Make sure you discuss any questions you have with your health care provider.   Document Released: 07/29/2012 Document Revised: 04/24/2014 Document Reviewed: 07/29/2012 Elsevier Interactive Patient Education 2016 Else[employer] Inc.  Borderline Personality Disorder Borderline personality disorder is a mental health disorder. People with borderline personality disorder have unhealthy patterns of perceiving, thinking about, and reacting to their environment and events in their life. These patterns are established by  adolescence or early adulthood. People with borderline personality disorder also have difficulty coping with stress on their own and fear being abandoned by others. They have difficulty controlling their emotions. Their emotions change quickly, frequently, and intensely. They are easily upset and can become very angry, very suddenly. Their unpredictable behavior often leads to problems in their relationships. They often feel worthless, unloved, and emotionally empty. CAUSES No one knows the exact cause of borderline personality disorder. Most mental health experts think that there is more than one cause. Possible contributing factors include:  Genetic factors. These are traits that are passed down from one generation to the next. Many people with borderline personality disorder have a family history of the disorder.  Physical factors. The part of the brain that controls emotion may be different in people who have borderline personality disorder.  Social factors. Traumatic experiences involving other people may play a role in the development of borderline personality disorder. Examples include neglect, abandonment, and physical and sexual abuse. SYMPTOMS Signs and symptoms of borderline personality disorder include:  A series of unstable personal relationships.  A strong fear of being abandoned and frantic efforts to avoid abandonment.  Impulsive, self-destructive behavior, such as substance abuse, irrational spending of money, unprotected sex with multiple partners, reckless driving, and binge eating.  Poor self-image that also may change a lot, or a sense of identity that is inconsistent.  Recurring self-injury or attempted suicide.  Severe mood swings, including depression, irritability, and anxiety.  Lasting feelings of emptiness.  Difficulty controlling anger.  Temporary feelings of paranoia or loss of touch with reality. DIAGNOSIS A diagnosis of borderline personality disorder  requires the presence of at least 5 of the common signs and symptoms. This information is gathered from family and friends as well as Systems analyst and legal  professionals who have a close association with the patient. This information is also gathered during a psychiatric assessment. During the assessment, the patient is asked about early life experiences, level of education, employment status, physical health conditions, and current prescription and over-the-counter medicines used. TREATMENT Caregivers who usually treat borderline personality disorder are mental health professionals, such as psychologists, psychiatrists, and clinical social workers. More than one type of treatment may be needed. Types of treatment include:  Psychotherapy (also known as talk therapy or counseling).  Cognitive behavioral therapy. This helps the person to recognize and change unhealthy feelings, thoughts, and behaviors. They find new, more positive thoughts and actions to replace the old ones.  Dialectical behavioral therapy. Through this type of treatment, a person learns to understand his or her feelings and to regulate them. This may be one-on-one treatment or part of group therapy.  Family therapy. This treatment includes family members.  Medicine. Medicine may be used to help control emotions, reduce reckless and self-destructive behavior, treat anxiety, and treat depression. SEEK IMMEDIATE MEDICAL CARE IF:   You cannot control your behavior or emotions.  You think about hurting yourself.  You think about suicide. FOR MORE INFORMATION National Alliance on Mental Illness: www.nami.org [place] [employer]: http://www.maynard.net/ Borderline Personality Resource Center: http://bpdresourcecenter.org   This information is not intended to replace advice given to you by your health care provider. Make sure you discuss any questions you have with your health care provider.   Document  Released: 07/19/2010 Document Revised: 06/26/2011 Document Reviewed: 07/19/2010 Elsevier Interactive Patient Education [employer]

## 2015-05-28 NOTE — ED Notes (Signed)
MD Forbach at bedside. 

## 2015-05-30 ENCOUNTER — Emergency Department
Admission: EM | Admit: 2015-05-30 | Discharge: 2015-05-30 | Disposition: A | Discharge status: Home / Self Care | Payer: Medicare Other | Attending: Emergency Medicine | Admitting: Emergency Medicine

## 2015-05-30 DIAGNOSIS — F329 Major depressive disorder, single episode, unspecified: Secondary | ICD-10-CM | POA: Insufficient documentation

## 2015-05-30 DIAGNOSIS — Z880 Allergy status to penicillin: Secondary | ICD-10-CM | POA: Diagnosis not present

## 2015-05-30 DIAGNOSIS — Z79899 Other long term (current) drug therapy: Secondary | ICD-10-CM | POA: Insufficient documentation

## 2015-05-30 DIAGNOSIS — F32A Depression, unspecified: Secondary | ICD-10-CM

## 2015-05-30 DIAGNOSIS — Z7951 Long term (current) use of inhaled steroids: Secondary | ICD-10-CM | POA: Diagnosis not present

## 2015-05-30 DIAGNOSIS — R45851 Suicidal ideations: Secondary | ICD-10-CM | POA: Diagnosis present

## 2015-05-30 DIAGNOSIS — Z791 Long term (current) use of non-steroidal anti-inflammatories (NSAID): Secondary | ICD-10-CM | POA: Diagnosis not present

## 2015-05-30 NOTE — ED Notes (Addendum)
[facility] [address] reports being suicidal states she came close to overdosing on pills today but something stopped her per pt. Denies prior suicidal attempts.

## 2015-05-30 NOTE — ED Provider Notes (Signed)
[facility] Emergency Department Provider Note   ____________________________________________  Time seen: ~1825  I have reviewed the triage vital signs and the nursing notes.   HISTORY  Chief Complaint Suicidal   History limited by: Not Limited   HPI [name] is a 50 y.o. female who re-presents to the emergency department today with continued thoughts of wanting to herself. This is a chronic from for the patient who has a history of depression. The patient does state that she is having problems at her group home and thateveryone is mad at her. The patient has been seen multiple times in this emergency department and multiple times by myself personally. She has had numerous psychiatric evaluations recently none of which have recommended inpatient admission. The patient denies any medical complaints.    Past Medical History  Diagnosis Date  . Asthma   . Anxiety   . GERD (gastroesophageal reflux disease)   . Mild cognitive impairment   . Cerebral palsy (HCC)   . Borderline personality disorder   . Depression   . Wound abscess   . Schizoaffective disorder [facility])     Patient Active Problem List   Diagnosis Date Noted  . Anxiety 12/31/2014  . Cellulitis and abscess of leg 12/31/2014  . 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 GERD (gastroesophageal reflux disease) 09/24/2014  . COPD (chronic obstructive pulmonary disease) (HCC) 09/24/2014  . Borderline personality disorder 09/24/2014  . Major depressive disorder, recurrent episode, moderate (HCC) 09/24/2014  . Cerebral palsy (HCC) 08/31/2014    Past Surgical History  Procedure Laterality Date  . Cholecystectomy    . Tonsillectomy    . Incision and drainage abscess Right 01/02/2015    Procedure: INCISION AND DRAINAGE ABSCESS;  Surgeon: Tiney Rouge III, MD;  Location: [facility] ORS;  Service: General;  Laterality: Right;    Current Outpatient Rx  Name  Route  Sig  Dispense  Refill  .  acetaminophen (TYLENOL) 500 MG tablet   Oral   Take 500 mg by mouth every 4 (four) hours as needed for mild pain, moderate pain, fever or headache.         . ARIPiprazole (ABILIFY) 10 MG tablet   Oral   Take 1 tablet (10 mg total) by mouth daily.   30 tablet   0   . azithromycin (ZITHROMAX Z-PAK) 250 MG tablet      Take 2 tablets (500 mg) on  Day 1,  followed by 1 tablet (250 mg) once daily on Days 2 through 5.   6 each   0   . diphenoxylate-atropine (LOMOTIL) 2.5-0.025 MG per tablet   Oral   Take 1 tablet by mouth every 6 (six) hours as needed for diarrhea or loose stools.         Marland Kitchen escitalopram (LEXAPRO) 20 MG tablet   Oral   Take 1 tablet (20 mg total) by mouth at bedtime.   30 tablet   0   . fenofibrate 54 MG tablet   Oral   Take 54 mg by mouth daily.         . Fluticasone-Salmeterol (ADVAIR) 100-50 MCG/DOSE AEPB   Inhalation   Inhale 1 puff into the lungs every 12 (twelve) hours.          Marland Kitchen guaiFENesin-codeine 100-10 MG/5ML syrup   Oral   Take 10 mLs by mouth every 4 (four) hours as needed for cough.   180 mL   0   . ibuprofen (ADVIL,MOTRIN) 800 MG tablet  Oral   Take 1 tablet (800 mg total) by mouth every 8 (eight) hours as needed.   30 tablet   0   . ketoconazole (NIZORAL) 2 % cream   Topical   Apply 1 application topically 2 (two) times daily as needed for irritation. To breast and urinal areas         . meloxicam (MOBIC) 15 MG tablet   Oral   Take 15 mg by mouth daily.         . miconazole (ZEASORB-AF) 2 % powder   Topical   Apply 1 application topically 2 (two) times daily.         . pantoprazole (PROTONIX) 40 MG tablet   Oral   Take 40 mg by mouth daily.         . traMADol (ULTRAM) 50 MG tablet   Oral   Take 50 mg by mouth 2 (two) times daily as needed for moderate pain or severe pain.           Allergies Penicillins  Family History  Problem Relation Age of Onset  . Diabetes Mother   . Heart attack Father      Social History Social History  Substance Use Topics  . Smoking status: Never Smoker   . Smokeless tobacco: Never Used  . Alcohol Use: No    Review of Systems  Constitutional: Negative for fever. Cardiovascular: Negative for chest pain. Respiratory: Negative for shortness of breath. Gastrointestinal: Negative for abdominal pain, vomiting and diarrhea. Neurological: Negative for headaches, focal weakness or numbness.   10-point ROS otherwise negative.  ____________________________________________   PHYSICAL EXAM:  VITAL SIGNS: ED Triage Vitals  Enc Vitals Group     BP 05/30/15 1811 121/79 mmHg     Pulse Rate 05/30/15 1811 104     Resp 05/30/15 1811 20     Temp 05/30/15 1811 98 F (36.7 C)     Temp Source 05/30/15 1811 Oral     SpO2 05/30/15 1811 99 %     Weight 05/30/15 1811 207 lb 14.4 oz (94.303 kg)     Height 05/30/15 1811  (1.626 m)     Head Cir --      Peak Flow --      Pain Score 05/30/15 1816 8   Constitutional: Awake and alert, no acute distress. Eyes: Conjunctivae are normal. PERRL. Normal extraocular movements. ENT   Head: Normocephalic and atraumatic.   Nose: No congestion/rhinnorhea.   Mouth/Throat: Mucous membranes are moist.   Neck: No stridor. Hematological/Lymphatic/Immunilogical: No cervical lymphadenopathy. Cardiovascular: Normal rate, regular rhythm.  No murmurs, rubs, or gallops. Respiratory: Normal respiratory effort without tachypnea nor retractions. Breath sounds are clear and equal bilaterally. No wheezes/rales/rhonchi. Gastrointestinal: Soft and nontender. No distention.  Genitourinary: Deferred Musculoskeletal: Normal range of motion in all extremities. No joint effusions.  No lower extremity tenderness nor edema. Neurologic:  Normal speech and language. No gross focal neurologic deficits are appreciated.  Skin:  Skin is warm, dry and intact. No rash noted.  ____________________________________________    LABS  (pertinent positives/negatives)  None  ____________________________________________   EKG  None  ____________________________________________    RADIOLOGY  None   ____________________________________________   PROCEDURES  Procedure(s) performed: None  Critical Care performed: No  ____________________________________________   INITIAL IMPRESSION / ASSESSMENT AND PLAN / ED COURSE  Pertinent labs & imaging results that were available during my care of the patient were reviewed by me and considered in my medical decision making (see chart  for details).  Patient re-presented to the emergency department today with chronic problem of suicidal ideation. Patient has been seen multiple times by myself personally as well as other providers and psychiatrists here in the emergency department. None of the recent psychiatrists have recommended inpatient admission. The patient presents similarly to the other times I personally seen her. I discussed with the patient's importance of her following up with outpatient psychiatry. Additionally I discussed with her strategies to cope with her stress and frustration with her group home living situation. Will plan on discharging him back to group home. I do not feel patient is an acute threat to herself or others.  ____________________________________________   FINAL CLINICAL IMPRESSION(S) / ED DIAGNOSES  Final diagnoses:  Depression     Phineas Semen, MD 05/30/15 1857

## 2015-05-30 NOTE — ED Notes (Signed)
Group home contacted and they refused to come get patient. I was asked to call patients brother [phone]) and ask him to come pick her up for discharge. Brother has agreed to come pick up patient and take back to group home.

## 2015-05-30 NOTE — Discharge Instructions (Signed)
Please seek medical attention and help for any thoughts about wanting to harm herself, harm others, any concerning change in behavior, severe depression, inappropriate drug use or any other new or concerning symptoms. °Major Depressive Disorder °Major depressive disorder is a mental illness. It also may be called clinical depression or unipolar depression. Major depressive disorder usually causes feelings of sadness, hopelessness, or helplessness. Some people with this disorder do not feel particularly sad but lose interest in doing things they used to enjoy (anhedonia). Major depressive disorder also can cause physical symptoms. It can interfere with work, school, relationships, and other normal everyday activities. The disorder varies in severity but is longer lasting and more serious than the sadness we all feel from time to time in our lives. °Major depressive disorder often is triggered by stressful life events or major life changes. Examples of these triggers include divorce, loss of your job or home, a move, and the death of a family member or close friend. Sometimes this disorder occurs for no obvious reason at all. People who have family members with major depressive disorder or bipolar disorder are at higher risk for developing this disorder, with or without life stressors. Major depressive disorder can occur at any age. It may occur just once in your life (single episode major depressive disorder). It may occur multiple times (recurrent major depressive disorder). °SYMPTOMS °People with major depressive disorder have either anhedonia or depressed mood on nearly a daily basis for at least 2 weeks or longer. Symptoms of depressed mood include: °· Feelings of sadness (blue or down in the dumps) or emptiness. °· Feelings of hopelessness or helplessness. °· Tearfulness or episodes of crying (may be observed by others). °· Irritability (children and adolescents). °In addition to depressed mood or anhedonia or  both, people with this disorder have at least four of the following symptoms: °· Difficulty sleeping or sleeping too much.   °· Significant change (increase or decrease) in appetite or weight.   °· Lack of energy or motivation. °· Feelings of guilt and worthlessness.   °· Difficulty concentrating, remembering, or making decisions. °· Unusually slow movement (psychomotor retardation) or restlessness (as observed by others).   °· Recurrent wishes for death, recurrent thoughts of self-harm (suicide), or a suicide attempt. °People with major depressive disorder commonly have persistent negative thoughts about themselves, other people, and the world. People with severe major depressive disorder may experience distorted beliefs or perceptions about the world (psychotic delusions). They also may see or hear things that are not real (psychotic hallucinations). °DIAGNOSIS °Major depressive disorder is diagnosed through an assessment by your health care provider. Your health care provider will ask about aspects of your daily life, such as mood, sleep, and appetite, to see if you have the diagnostic symptoms of major depressive disorder. Your health care provider may ask about your medical history and use of alcohol or drugs, including prescription medicines. Your health care provider also may do a physical exam and blood work. This is because certain medical conditions and the use of certain substances can cause major depressive disorder-like symptoms (secondary depression). Your health care provider also may refer you to a mental health specialist for further evaluation and treatment. °TREATMENT °It is important to recognize the symptoms of major depressive disorder and seek treatment. The following treatments can be prescribed for this disorder:   °· Medicine. Antidepressant medicines usually are prescribed. Antidepressant medicines are thought to correct chemical imbalances in the brain that are commonly associated with  major depressive disorder. Other types of medicine may be   added if the symptoms do not respond to antidepressant medicines alone or if psychotic delusions or hallucinations occur. °· Talk therapy. Talk therapy can be helpful in treating major depressive disorder by providing support, education, and guidance. Certain types of talk therapy also can help with negative thinking (cognitive behavioral therapy) and with relationship issues that trigger this disorder (interpersonal therapy). °A mental health specialist can help determine which treatment is best for you. Most people with major depressive disorder do well with a combination of medicine and talk therapy. Treatments involving electrical stimulation of the brain can be used in situations with extremely severe symptoms or when medicine and talk therapy do not work over time. These treatments include electroconvulsive therapy, transcranial magnetic stimulation, and vagal nerve stimulation. °  °This information is not intended to replace advice given to you by your health care provider. Make sure you discuss any questions you have with your health care provider. °  °Document Released: 07/29/2012 Document Revised: 04/24/2014 Document Reviewed: 07/29/2012 °Elsevier Interactive Patient Education ©2016 Elsevier Inc. ° °

## 2015-05-31 ENCOUNTER — Emergency Department
Admission: EM | Admit: 2015-05-31 | Discharge: 2015-06-01 | Disposition: A | Discharge status: Other Acute Inpatient Hospital | Payer: Medicare Other | Attending: Emergency Medicine | Admitting: Emergency Medicine

## 2015-05-31 ENCOUNTER — Encounter: Payer: Self-pay | Admitting: Urgent Care

## 2015-05-31 DIAGNOSIS — F329 Major depressive disorder, single episode, unspecified: Secondary | ICD-10-CM | POA: Insufficient documentation

## 2015-05-31 DIAGNOSIS — K219 Gastro-esophageal reflux disease without esophagitis: Secondary | ICD-10-CM | POA: Diagnosis present

## 2015-05-31 DIAGNOSIS — F1310 Sedative, hypnotic or anxiolytic abuse, uncomplicated: Secondary | ICD-10-CM | POA: Diagnosis not present

## 2015-05-31 DIAGNOSIS — F209 Schizophrenia, unspecified: Secondary | ICD-10-CM | POA: Diagnosis not present

## 2015-05-31 DIAGNOSIS — Z880 Allergy status to penicillin: Secondary | ICD-10-CM | POA: Diagnosis not present

## 2015-05-31 DIAGNOSIS — Z7951 Long term (current) use of inhaled steroids: Secondary | ICD-10-CM | POA: Insufficient documentation

## 2015-05-31 DIAGNOSIS — Z791 Long term (current) use of non-steroidal anti-inflammatories (NSAID): Secondary | ICD-10-CM | POA: Insufficient documentation

## 2015-05-31 DIAGNOSIS — Z792 Long term (current) use of antibiotics: Secondary | ICD-10-CM | POA: Insufficient documentation

## 2015-05-31 DIAGNOSIS — G809 Cerebral palsy, unspecified: Secondary | ICD-10-CM | POA: Diagnosis present

## 2015-05-31 DIAGNOSIS — F419 Anxiety disorder, unspecified: Secondary | ICD-10-CM | POA: Diagnosis not present

## 2015-05-31 DIAGNOSIS — F70 Mild intellectual disabilities: Secondary | ICD-10-CM | POA: Diagnosis present

## 2015-05-31 DIAGNOSIS — R45851 Suicidal ideations: Secondary | ICD-10-CM | POA: Diagnosis present

## 2015-05-31 DIAGNOSIS — J449 Chronic obstructive pulmonary disease, unspecified: Secondary | ICD-10-CM | POA: Diagnosis present

## 2015-05-31 DIAGNOSIS — F603 Borderline personality disorder: Secondary | ICD-10-CM | POA: Diagnosis present

## 2015-05-31 LAB — COMPREHENSIVE METABOLIC PANEL
ALBUMIN: 3.9 g/dL (ref 3.5–5.0)
ALT: 12 U/L — ABNORMAL LOW (ref 14–54)
AST: 16 U/L (ref 15–41)
Alkaline Phosphatase: 44 U/L (ref 38–126)
Anion gap: 9 (ref 5–15)
BUN: 24 mg/dL — ABNORMAL HIGH (ref 6–20)
CO2: 26 mmol/L (ref 22–32)
Calcium: 9 mg/dL (ref 8.9–10.3)
Chloride: 108 mmol/L (ref 101–111)
Creatinine, Ser: 0.78 mg/dL (ref 0.44–1.00)
GFR calc non Af Amer: >60 mL/min (ref >60)
GLUCOSE: 88 mg/dL (ref 65–99)
POTASSIUM: 3.6 mmol/L (ref 3.5–5.1)
SODIUM: 143 mmol/L (ref 135–145)
TOTAL PROTEIN: 7.3 g/dL (ref 6.5–8.1)
Total Bilirubin: 0.2 mg/dL — ABNORMAL LOW (ref 0.3–1.2)

## 2015-05-31 LAB — CBC WITH DIFFERENTIAL/PLATELET
BASOS ABS: 0.1 10*3/uL (ref 0–0.1)
BASOS PCT: 1 %
EOS PCT: 4 %
Eosinophils Absolute: 0.3 10*3/uL (ref 0–0.7)
HCT: 36.4 % (ref 35.0–47.0)
Hemoglobin: 12.3 g/dL (ref 12.0–16.0)
LYMPHS PCT: 27 %
Lymphs Abs: 2 10*3/uL (ref 1.0–3.6)
MCH: 30.7 pg (ref 26.0–34.0)
MCHC: 33.7 g/dL (ref 32.0–36.0)
MCV: 91.1 fL (ref 80.0–100.0)
MONO ABS: 0.8 10*3/uL (ref 0.2–0.9)
Monocytes Relative: 11 %
NEUTROS ABS: 4.2 10*3/uL (ref 1.4–6.5)
Neutrophils Relative %: 57 %
PLATELETS: 289 10*3/uL (ref 150–440)
RBC: 3.99 MIL/uL (ref 3.80–5.20)
RDW: 14 % (ref 11.5–14.5)
WBC: 7.4 10*3/uL (ref 3.6–11.0)

## 2015-05-31 LAB — SALICYLATE LEVEL

## 2015-05-31 LAB — ACETAMINOPHEN LEVEL

## 2015-05-31 LAB — ETHANOL: Alcohol, Ethyl (B): <5 mg/dL (ref <5)

## 2015-05-31 NOTE — ED Notes (Signed)
ED BHU PLACEMENT JUSTIFICATION Is the patient under IVC or is there intent for IVC: Yes.   Is the patient medically cleared: Yes.   Is there vacancy in the ED BHU: Yes.   Is the population mix appropriate for patient: Yes.   Is the patient awaiting placement in inpatient or outpatient setting: Yes.   Has the patient had a psychiatric consult: Yes.   Survey of unit performed for contraband, proper placement and condition of furniture, tampering with fixtures in bathroom, shower, and each patient room: Yes.  ; Findings:  APPEARANCE/BEHAVIOR calm and cooperative NEURO ASSESSMENT Orientation: time, place and person Hallucinations: Yes.  Auditory Hallucinations Speech: Normal Gait: normal RESPIRATORY ASSESSMENT Normal expansion.  Clear to auscultation.  No rales, rhonchi, or wheezing. CARDIOVASCULAR ASSESSMENT regular rate and rhythm, S1, S2 normal, no murmur, click, rub or gallop GASTROINTESTINAL ASSESSMENT soft, nontender, BS WNL, no r/g EXTREMITIES normal strength, tone, and muscle mass PLAN OF CARE Provide calm/safe environment. Vital signs assessed twice daily. ED BHU Assessment once each 12-hour shift. Collaborate with intake RN daily or as condition indicates. Assure the ED provider has rounded once each shift. Provide and encourage hygiene. Provide redirection as needed. Assess for escalating behavior; address immediately and inform ED provider.  Assess family dynamic and appropriateness for visitation as needed: Yes.  ; If necessary, describe findings:  Educate the patient/family about BHU procedures/visitation: Yes.  ; If necessary, describe findings:   

## 2015-05-31 NOTE — ED Notes (Signed)
BEHAVIORAL HEALTH ROUNDING Patient sleeping: No. Patient alert and oriented: yes Behavior appropriate: Yes.  ; If no, describe:  Nutrition and fluids offered: Yes  Toileting and hygiene offered: Yes  Sitter present: no Law enforcement present: Yes  

## 2015-05-31 NOTE — ED Notes (Signed)

## 2015-05-31 NOTE — ED Notes (Signed)
Pt. Noted sleeping in room. No complaints or concerns voiced. No distress or abnormal behavior noted. Will continue to monitor with security cameras. Q 15 minute rounds continue. 

## 2015-05-31 NOTE — ED Notes (Signed)
Spoke with Dr. Scotty Court regarding this pt. Instructed to dress out pt per protocol, but to refrain from drawing blood or obtaining urine prior to pt being evaluated by MD.

## 2015-05-31 NOTE — ED Notes (Signed)
Patient presents to the ED in custody of [employer] officer; under IVC at this time. Patient seen by [facility] and was subsequently IVC'd by Dr. Georjean Mode following evaluation. Patient reported to Dr. Georjean Mode that she was suicidal with (+) plan; (+) attempt yesterday by overdose per IVC papers. (+) command AH that are telling patient to harm herself.

## 2015-05-31 NOTE — ED Provider Notes (Addendum)
[facility] Emergency Department Provider Note  ____________________________________________  Time seen: 8:20 PM  I have reviewed the triage vital signs and the nursing notes.   HISTORY  Chief Complaint Suicidal    HPI [name] is a 50 y.o. female sent to the ED under IVC by [facility] due to command auditory hallucinations time the patient herself as well as suicidal ideation with a plan to go to [employer] and buy some "pills" to overdose on. She reports that the auditory hallucinations are nothing new and are exactly the same as always. She reports that the suicidal ideation and wanting to kill herself are new for last 2 days.  Notable the patient does have a long history of chronic suicidal ideation with multiple psychiatric evaluations in this emergency department. She is always found to be psychiatrically stable and discharged home. However, now the patient is stating that she has a plan that she didn't have before, and she was placed under involuntary commitment by Dr. Georjean Mode at [facility].     Past Medical History  Diagnosis Date  . Asthma   . Anxiety   . GERD (gastroesophageal reflux disease)   . Mild cognitive impairment   . Cerebral palsy (HCC)   . Borderline personality disorder   . Depression   . Wound abscess   . Schizoaffective disorder [facility])      Patient Active Problem List   Diagnosis Date Noted  . Anxiety 12/31/2014  . Cellulitis and abscess of leg 12/31/2014  . 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 GERD (gastroesophageal reflux disease) 09/24/2014  . COPD (chronic obstructive pulmonary disease) (HCC) 09/24/2014  . Borderline personality disorder 09/24/2014  . Major depressive disorder, recurrent episode, moderate (HCC) 09/24/2014  . Cerebral palsy (HCC) 08/31/2014     Past Surgical History  Procedure Laterality Date  . Cholecystectomy    . Tonsillectomy    . Incision and drainage abscess Right 01/02/2015    Procedure:  INCISION AND DRAINAGE ABSCESS;  Surgeon: Tiney Rouge III, MD;  Location: [facility] ORS;  Service: General;  Laterality: Right;     Current Outpatient Rx  Name  Route  Sig  Dispense  Refill  . acetaminophen (TYLENOL) 500 MG tablet   Oral   Take 500 mg by mouth every 4 (four) hours as needed for mild pain, moderate pain, fever or headache.         . ARIPiprazole (ABILIFY) 10 MG tablet   Oral   Take 1 tablet (10 mg total) by mouth daily.   30 tablet   0   . azithromycin (ZITHROMAX Z-PAK) 250 MG tablet      Take 2 tablets (500 mg) on  Day 1,  followed by 1 tablet (250 mg) once daily on Days 2 through 5.   6 each   0   . diphenoxylate-atropine (LOMOTIL) 2.5-0.025 MG per tablet   Oral   Take 1 tablet by mouth every 6 (six) hours as needed for diarrhea or loose stools.         Marland Kitchen escitalopram (LEXAPRO) 20 MG tablet   Oral   Take 1 tablet (20 mg total) by mouth at bedtime.   30 tablet   0   . fenofibrate 54 MG tablet   Oral   Take 54 mg by mouth daily.         . Fluticasone-Salmeterol (ADVAIR) 100-50 MCG/DOSE AEPB   Inhalation   Inhale 1 puff into the lungs every 12 (twelve) hours.          Marland Kitchen  guaiFENesin-codeine 100-10 MG/5ML syrup   Oral   Take 10 mLs by mouth every 4 (four) hours as needed for cough.   180 mL   0   . ibuprofen (ADVIL,MOTRIN) 800 MG tablet   Oral   Take 1 tablet (800 mg total) by mouth every 8 (eight) hours as needed.   30 tablet   0   . ketoconazole (NIZORAL) 2 % cream   Topical   Apply 1 application topically 2 (two) times daily as needed for irritation. To breast and urinal areas         . meloxicam (MOBIC) 15 MG tablet   Oral   Take 15 mg by mouth daily.         . miconazole (ZEASORB-AF) 2 % powder   Topical   Apply 1 application topically 2 (two) times daily.         . pantoprazole (PROTONIX) 40 MG tablet   Oral   Take 40 mg by mouth daily.         . traMADol (ULTRAM) 50 MG tablet   Oral   Take 50 mg by mouth 2 (two) times  daily as needed for moderate pain or severe pain.            Allergies Penicillins   Family History  Problem Relation Age of Onset  . Diabetes Mother   . Heart attack Father     Social History Social History  Substance Use Topics  . Smoking status: Never Smoker   . Smokeless tobacco: Never Used  . Alcohol Use: No    Review of Systems  Constitutional:   No fever or chills. No weight changes Eyes:   No blurry vision or double vision.  ENT:   No sore throat. Cardiovascular:   No chest pain. Respiratory:   No dyspnea or cough. Gastrointestinal:   Negative for abdominal pain, vomiting and diarrhea.  No BRBPR or melena. Genitourinary:   Negative for dysuria, urinary retention, bloody urine, or difficulty urinating. Musculoskeletal:   Negative for back pain. No joint swelling or pain. Skin:   Negative for rash. Neurological:   Negative for headaches, focal weakness or numbness. Psychiatric:  Positive depression.   Endocrine:  No hot/cold intolerance, changes in energy, or sleep difficulty.  10-point ROS otherwise negative.  ____________________________________________   PHYSICAL EXAM:  VITAL SIGNS: ED Triage Vitals  Enc Vitals Group     BP 05/31/15 2004 133/103 mmHg     Pulse Rate 05/31/15 2004 100     Resp 05/31/15 2004 18     Temp 05/31/15 2004 98.4 F (36.9 C)     Temp Source 05/31/15 2004 Oral     SpO2 05/31/15 2004 98 %     Weight 05/31/15 2004 207 lb (93.895 kg)     Height --      Head Cir --      Peak Flow --      Pain Score 05/31/15 2005 0     Pain Loc --      Pain Edu? --      Excl. in GC? --     Vital signs reviewed, nursing assessments reviewed.   Constitutional:   Alert and oriented. Well appearing and in no distress. Eyes:   No scleral icterus. No conjunctival pallor. PERRL. EOMI ENT   Head:   Normocephalic and atraumatic.   Nose:   No congestion/rhinnorhea. No septal hematoma   Mouth/Throat:   MMM, no pharyngeal erythema. No  peritonsillar mass. No  uvula shift.   Neck:   No stridor. No SubQ emphysema. No meningismus. Hematological/Lymphatic/Immunilogical:   No cervical lymphadenopathy. Cardiovascular:   RRR. Normal and symmetric distal pulses are present in all extremities. No murmurs, rubs, or gallops. Respiratory:   Normal respiratory effort without tachypnea nor retractions. Breath sounds are clear and equal bilaterally. No wheezes/rales/rhonchi. Gastrointestinal:   Soft and nontender. No distention. There is no CVA tenderness.  No rebound, rigidity, or guarding. Genitourinary:   deferred Musculoskeletal:   Nontender with normal range of motion in all extremities. No joint effusions.  No lower extremity tenderness.  No edema. Neurologic:   Normal speech and language.  CN 2-10 normal. Motor grossly intact. No pronator drift.  Normal gait. No gross focal neurologic deficits are appreciated.  Skin:    Skin is warm, dry and intact. No rash noted.  No petechiae, purpura, or bullae. Psychiatric:   Depressed mood, depressed affect.  ____________________________________________    LABS (pertinent positives/negatives) (all labs ordered are listed, but only abnormal results are displayed) Labs Reviewed  COMPREHENSIVE METABOLIC PANEL  ETHANOL  SALICYLATE LEVEL  ACETAMINOPHEN LEVEL  CBC WITH DIFFERENTIAL/PLATELET  URINALYSIS COMPLETEWITH MICROSCOPIC ([facility] ONLY)  URINE DRUG SCREEN, QUALITATIVE ([facility] ONLY)   ____________________________________________   EKG  Interpreted by me Normal sinus rhythm rate 86, normal axis intervals. Poor R-wave progression in anterior precordial leads. Isolated T-wave inversion in V3 which is nonspecific. No acute ischemic changes. Normal ST segments.  ____________________________________________    RADIOLOGY    ____________________________________________   PROCEDURES   ____________________________________________   INITIAL IMPRESSION / ASSESSMENT AND PLAN / ED  COURSE  Pertinent labs & imaging results that were available during my care of the patient were reviewed by me and considered in my medical decision making (see chart for details).  Chronic severe mental illness. Continue IVC for now given the suicidal ideation with a plan that appears to be new. We'll hold in the emergency department overnight until the patient can have a psychiatry evaluation tomorrow.     ____________________________________________   FINAL CLINICAL IMPRESSION(S) / ED DIAGNOSES  Final diagnoses:  Schizophrenia, unspecified type [facility])  Suicidal ideation      Sharman Cheek, MD 05/31/15 2051  Sharman Cheek, MD 05/31/15 2154

## 2015-05-31 NOTE — ED Notes (Signed)
Pt. To BHU from ED ambulatory without difficulty, to room 5 . Report from [facility]. Pt. Is alert, warm and dry in no distress. Pt. Denies HI, and VH. Pt. States she has SI with a plan to "scratch myself" and AH of father without command. Pt. Calm and cooperative. Pt. Made aware of security cameras and Q15 minute rounds. Pt. Encouraged to let Nursing staff know of any concerns or needs.
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
## 2015-05-31 NOTE — ED Notes (Signed)
Pt. Made aware of the need for a urine sample. 

## 2015-05-31 NOTE — ED Provider Notes (Signed)
Patient started complaining of diffuse chest tightness during her interview with the therapeutic triage specialist. She otherwise is not in distress, calm and comfortable. This is pretty typical for this patient. He is not describing any exertional symptoms, not diaphoretic or vomiting or short of breath. EKG unremarkable. No further workup warranted at this time.  Sharman Cheek, MD 05/31/15 2155

## 2015-06-01 ENCOUNTER — Encounter: Payer: Self-pay | Admitting: Internal Medicine

## 2015-06-01 DIAGNOSIS — F419 Anxiety disorder, unspecified: Secondary | ICD-10-CM | POA: Diagnosis not present

## 2015-06-01 LAB — URINALYSIS COMPLETE WITH MICROSCOPIC (ARMC ONLY)
BACTERIA UA: NONE SEEN
Bilirubin Urine: NEGATIVE
Glucose, UA: NEGATIVE mg/dL
Hgb urine dipstick: NEGATIVE
KETONES UR: NEGATIVE mg/dL
LEUKOCYTES UA: NEGATIVE
NITRITE: NEGATIVE
PROTEIN: NEGATIVE mg/dL
Specific Gravity, Urine: 1.028 (ref 1.005–1.030)
pH: 6 (ref 5.0–8.0)

## 2015-06-01 LAB — URINE DRUG SCREEN, QUALITATIVE (ARMC ONLY)
AMPHETAMINES, UR SCREEN: NOT DETECTED
Barbiturates, Ur Screen: NOT DETECTED
Benzodiazepine, Ur Scrn: POSITIVE — AB
CANNABINOID 50 NG, UR ~~LOC~~: NOT DETECTED
Cocaine Metabolite,Ur ~~LOC~~: NOT DETECTED
MDMA (ECSTASY) UR SCREEN: NOT DETECTED
Methadone Scn, Ur: NOT DETECTED
OPIATE, UR SCREEN: NOT DETECTED
PHENCYCLIDINE (PCP) UR S: NOT DETECTED
Tricyclic, Ur Screen: NOT DETECTED

## 2015-06-01 NOTE — ED Notes (Signed)
Pt. Noted sleeping in room. No complaints or concerns voiced. No distress or abnormal behavior noted. Will continue to monitor with security cameras. Q 15 minute rounds continue. 

## 2015-06-01 NOTE — ED Notes (Signed)
Patient discharged ambulatory back to group home. She denies SI or HI. Patient received copy of discharge instructions and all personal belongings.

## 2015-06-01 NOTE — Discharge Instructions (Signed)
Stress and Stress Management °Stress is a normal reaction to life events. It is what you feel when life demands more than you are used to or more than you can handle. Some stress can be useful. For example, the stress reaction can help you catch the last bus of the day, study for a test, or meet a deadline at work. But stress that occurs too often or for too long can cause problems. It can affect your emotional health and interfere with relationships and normal daily activities. Too much stress can weaken your immune system and increase your risk for physical illness. If you already have a medical problem, stress can make it worse. °CAUSES  °All sorts of life events may cause stress. An event that causes stress for one person may not be stressful for another person. Major life events commonly cause stress. These may be positive or negative. Examples include losing your job, moving into a new home, getting married, having a baby, or losing a loved one. Less obvious life events may also cause stress, especially if they occur day after day or in combination. Examples include working long hours, driving in traffic, caring for children, being in debt, or being in a difficult relationship. °SIGNS AND SYMPTOMS °Stress may cause emotional symptoms including, the following: °· Anxiety. This is feeling worried, afraid, on edge, overwhelmed, or out of control. °· Anger. This is feeling irritated or impatient. °· Depression. This is feeling sad, down, helpless, or guilty. °· Difficulty focusing, remembering, or making decisions. °Stress may cause physical symptoms, including the following:  °· Aches and pains. These may affect your head, neck, back, stomach, or other areas of your body. °· Tight muscles or clenched jaw. °· Low energy or trouble sleeping.  °Stress may cause unhealthy behaviors, including the following:  °· Eating to feel better (overeating) or skipping meals. °· Sleeping too little, too much, or both. °· Working  too much or putting off tasks (procrastination). °· Smoking, drinking alcohol, or using drugs to feel better. °DIAGNOSIS  °Stress is diagnosed through an assessment by your health care provider. Your health care provider will ask questions about your symptoms and any stressful life events. Your health care provider will also ask about your medical history and may order blood tests or other tests. Certain medical conditions and medicine can cause physical symptoms similar to stress.  Mental illness can cause emotional symptoms and unhealthy behaviors similar to stress. Your health care provider may refer you to a mental health professional for further evaluation.  °TREATMENT  °Stress management is the recommended treatment for stress. The goals of stress management are reducing stressful life events and coping with stress in healthy ways.  °Techniques for reducing stressful life events include the following: °· Stress identification. Self-monitor for stress and identify what causes stress for you. These skills may help you to avoid some stressful events. °· Time management. Set your priorities, keep a calendar of events, and learn to say "no." These tools can help you avoid making too many commitments. °Techniques for coping with stress include the following: °· Rethinking the problem. Try to think realistically about stressful events rather than ignoring them or overreacting. Try to find the positives in a stressful situation rather than focusing on the negatives. °· Exercise. Physical exercise can release both physical and emotional tension. The key is to find a form of exercise you enjoy and do it regularly. °· Relaxation techniques. These relax the body and mind. Examples include yoga, meditation, tai chi, biofeedback, deep   breathing, progressive muscle relaxation, listening to music, being out in nature, journaling, and other hobbies. Again, the key is to find one or more that you enjoy and can do  regularly.  Healthy lifestyle. Eat a balanced diet, get plenty of sleep, and do not smoke. Avoid using alcohol or drugs to relax.  Strong support network. Spend time with family, friends, or other people you enjoy being around.Express your feelings and talk things over with someone you trust. Counseling or talktherapy with a mental health professional may be helpful if you are having difficulty managing stress on your own. Medicine is typically not recommended for the treatment of stress.Talk to your health care provider if you think you need medicine for symptoms of stress. HOME CARE INSTRUCTIONS  Keep all follow-up visits as directed by your health care provider.  Take all medicines as directed by your health care provider. SEEK MEDICAL CARE IF:  Your symptoms get worse or you start having new symptoms.  You feel overwhelmed by your problems and can no longer manage them on your own. SEEK IMMEDIATE MEDICAL CARE IF:  You feel like hurting yourself or someone else.   This information is not intended to replace advice given to you by your health care provider. Make sure you discuss any questions you have with your health care provider.   Document Released: 09/27/2000 Document Revised: 04/24/2014 Document Reviewed: 11/26/2012 Elsevier Interactive Patient Education [employer].

## 2015-06-01 NOTE — Progress Notes (Signed)
LCSW spoke with patient. She requested a list of group homes she can call. Patient was informed that she has a guardian and that together they could look into reasonable and safe future housing options. In consultation with ED psychiatrist, BHU nurse patient will take a cab back to her group home.  [employer] LCSW [phone]

## 2015-06-01 NOTE — ED Notes (Signed)
Patient resting quietly in room. No noted distress or abnormal behaviors noted. Will continue 15 minute checks and observation by security camera for safety. 

## 2015-06-01 NOTE — Progress Notes (Signed)
Pt is scheduled to be discharged. TTS has contacted the pts Group Home [facility] [phone]). Group home representative has stated that she has arranged transportation for the pt. [employer] will arrive to transport the pt in approximately 30 minutes.   06/01/2015 Cheryl Flash, MS, NCC, LPCA Therapeutic Triage Specialist

## 2015-06-01 NOTE — ED Notes (Signed)
Patient in dayroom watching TV with peer. In no acute distress. Maintained on all security precautions.

## 2015-06-01 NOTE — ED Notes (Signed)

## 2015-06-01 NOTE — Consult Note (Signed)
[facility] Face-to-Face Psychiatry Consult   Reason for Consult:  Consult for 50 year old woman with a history of mild MR, anxiety, behavioral disturbance. Referring Physician:  Corky Downs [name] MRN:  [id number] Principal Diagnosis: Anxiety Diagnosis:   Patient Active Problem List   Diagnosis Date Noted  . Anxiety [F41.9] 12/31/2014  . Cellulitis and abscess of leg [L02.419, L03.119] 12/31/2014  . 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 GERD (gastroesophageal reflux disease) [K21.9] 09/24/2014  . COPD (chronic obstructive pulmonary disease) ([place]) [J44.9] 09/24/2014  . Borderline personality disorder [F60.3] 09/24/2014  . Major depressive disorder, recurrent episode, moderate ([place]) [F33.1] 09/24/2014  . Cerebral palsy ([place]) [G80.9] 08/31/2014    Total Time spent with patient: 1 hour  Subjective:   [name] is a 50 y.o. female patient admitted with "I just had the suicidal thoughts again".  HPI:  Patient interviewed chart reviewed old notes reviewed labs reviewed. 50 year old woman well known to [place] from her frequent emergency room visits, is back with essentially the same complaints. She says that she had suicidal thoughts twice over the last couple days. This time she says that she actually went to the [employer] store and thought about purchasing some kind of pills with a thought of suicide but did not actually by anything. Her complaints are the same as always. She dislikes her group home. Thinks the people there are unpleasant to be around and dislikes the neighborhood. Complains of how she is treated there. Patient's mood seems to be anxious and dysphoric a lot of the time. No new physical symptoms identified. Patient is able to articulate a positive plan that she has to the future and admits that she does not actually want to die. She is not currently having any acute obvious psychotic symptoms. She has been compliant with medicine. Apparently she went  to visit her mother over the weekend which is almost always a agitating situation for her.  Social history: Patient lives in a group home. She's been there for several months and has been complaining about it ever since she got there. She does not have a guardian and so she makes her own decisions. According to her there is been some kind of follow-up with her check such that her current group home has not been paid for several months. This is certainly possible and it could explain why they are trying to encourage her to stay there. Closest relation is her mother who is elderly in [place] and her brother.  Medical history: Patient has cerebral palsy but has only fairly mild physical problems from it. She has COPD history of hypertension in the past.  Substance abuse history: Not drinking not abusing any drugs no identified past substance abuse problem  Past Psychiatric History: Patient has a history of multiple visits to the emergency room with the same complaints. She is currently on medication for depression and anxiety and mood stabilization. She is followed up by local provider. She has a distant past history of self injury. No history of violence. Carries a diagnosis of schizophrenia in the past although it's unclear to me whether there is ever been a real basis for this. Mostly seems to have mood problems  Risk to Self: Suicidal Ideation: Yes-Currently Present Suicidal Intent: Yes-Currently Present Is patient at risk for suicide?: Yes Suicidal Plan?: Yes-Currently Present Specify Current Suicidal Plan: Overdose on pills Access to Means: Yes (States she will go to [employer] and get pills) Specify Access to Suicidal Means: access to store to  purchase pills What has been your use of drugs/alcohol within the last 12 months?: denied How many times?: 2 Other Self Harm Risks: none reported Triggers for Past Attempts: Unknown Intentional Self Injurious Behavior: None Risk to Others:  Homicidal Ideation: No Thoughts of Harm to Others: No Current Homicidal Intent: No Current Homicidal Plan: No Access to Homicidal Means: No Identified Victim: Denied History of harm to others?: No Assessment of Violence: None Noted Violent Behavior Description: denied Does patient have access to weapons?: No Criminal Charges Pending?: No Does patient have a court date: No Prior Inpatient Therapy: Prior Inpatient Therapy: Yes Prior Therapy Dates: Unsure, Multiple hospitalizations Prior Therapy Facilty/Provider(s): [facility], [facility] Reason for Treatment: Depression, SI Prior Outpatient Therapy: Prior Outpatient Therapy: Yes Prior Therapy Dates: Current Prior Therapy Facilty/Provider(s): [facility] Reason for Treatment: Depression, SI Does patient have an ACCT team?: No Does patient have Intensive In-House Services?  : No Does patient have [facility] services? : No Does patient have P4CC services?: No  Past Medical History:  Past Medical History  Diagnosis Date  . Asthma   . Anxiety   . GERD (gastroesophageal reflux disease)   . Mild cognitive impairment   . Cerebral palsy ([place])   . Borderline personality disorder   . Depression   . Wound abscess   . Schizoaffective disorder [facility])     Past Surgical History  Procedure Laterality Date  . Cholecystectomy    . Tonsillectomy    . Incision and drainage abscess Right 01/02/2015    Procedure: INCISION AND DRAINAGE ABSCESS;  Surgeon: Dia Crawford III, MD;  Location: [facility] ORS;  Service: General;  Laterality: Right;   Family History:  Family History  Problem Relation Age of Onset  . Diabetes Mother   . Heart attack Father    Family Psychiatric  History: Patient reports that she has had some family history of anxiety symptoms Social History:  History  Alcohol Use No     History  Drug Use No    Social History   Social History  . Marital Status: Single    Spouse Name: N/A  . Number of Children: N/A  . Years of Education: N/A   Social  History Main Topics  . Smoking status: Never Smoker   . Smokeless tobacco: Never Used  . Alcohol Use: No  . Drug Use: No  . Sexual Activity: Yes    Birth Control/ Protection: Implant   Other Topics Concern  . None   Social History Narrative   Additional Social History:    Allergies:   Allergies  Allergen Reactions  . Penicillins Other (See Comments)    Pt states that she is "just scared to take it".      Labs:  Results for orders placed or performed during the hospital encounter of 05/31/15 (from the past 48 hour(s))  Urinalysis complete, with microscopic     Status: Abnormal   Collection Time: 05/31/15  7:05 AM  Result Value Ref Range   Color, Urine YELLOW (A) YELLOW   APPearance CLEAR (A) CLEAR   Glucose, UA NEGATIVE NEGATIVE mg/dL   Bilirubin Urine NEGATIVE NEGATIVE   Ketones, ur NEGATIVE NEGATIVE mg/dL   Specific Gravity, Urine 1.028 1.005 - 1.030   Hgb urine dipstick NEGATIVE NEGATIVE   pH 6.0 5.0 - 8.0   Protein, ur NEGATIVE NEGATIVE mg/dL   Nitrite NEGATIVE NEGATIVE   Leukocytes, UA NEGATIVE NEGATIVE   RBC / HPF 0-5 0 - 5 RBC/hpf   WBC, UA 0-5 0 - 5  WBC/hpf   Bacteria, UA NONE SEEN NONE SEEN   Squamous Epithelial / LPF 0-5 (A) NONE SEEN   Mucous PRESENT    Hyaline Casts, UA PRESENT   Urine Drug Screen, Qualitative     Status: Abnormal   Collection Time: 05/31/15  7:05 AM  Result Value Ref Range   Tricyclic, Ur Screen NONE DETECTED NONE DETECTED   Amphetamines, Ur Screen NONE DETECTED NONE DETECTED   MDMA (Ecstasy)Ur Screen NONE DETECTED NONE DETECTED   Cocaine Metabolite,Ur [place] NONE DETECTED NONE DETECTED   Opiate, Ur Screen NONE DETECTED NONE DETECTED   Phencyclidine (PCP) Ur S NONE DETECTED NONE DETECTED   Cannabinoid 50 Ng, Ur [place] NONE DETECTED NONE DETECTED   Barbiturates, Ur Screen NONE DETECTED NONE DETECTED   Benzodiazepine, Ur Scrn POSITIVE (A) NONE DETECTED   Methadone Scn, Ur NONE DETECTED NONE DETECTED    Comment: (NOTE) 449  Tricyclics,  urine               Cutoff 1000 ng/mL 200  Amphetamines, urine             Cutoff 1000 ng/mL 300  MDMA (Ecstasy), urine           Cutoff 500 ng/mL 400  Cocaine Metabolite, urine       Cutoff 300 ng/mL 500  Opiate, urine                   Cutoff 300 ng/mL 600  Phencyclidine (PCP), urine      Cutoff 25 ng/mL 700  Cannabinoid, urine              Cutoff 50 ng/mL 800  Barbiturates, urine             Cutoff 200 ng/mL 900  Benzodiazepine, urine           Cutoff 200 ng/mL 1000 Methadone, urine                Cutoff 300 ng/mL 1100 1200 The urine drug screen provides only a preliminary, unconfirmed 1300 analytical test result and should not be used for non-medical 1400 purposes. Clinical consideration and professional judgment should 1500 be applied to any positive drug screen result due to possible 1600 interfering substances. A more specific alternate chemical method 1700 must be used in order to obtain a confirmed analytical result.  1800 Gas chromato graphy / mass spectrometry (GC/MS) is the preferred 1900 confirmatory method.   Comprehensive metabolic panel     Status: Abnormal   Collection Time: 05/31/15  8:46 PM  Result Value Ref Range   Sodium 143 135 - 145 mmol/L   Potassium 3.6 3.5 - 5.1 mmol/L   Chloride 108 101 - 111 mmol/L   CO2 26 22 - 32 mmol/L   Glucose, Bld 88 65 - 99 mg/dL   BUN 24 (H) 6 - 20 mg/dL   Creatinine, Ser 0.78 0.44 - 1.00 mg/dL   Calcium 9.0 8.9 - 10.3 mg/dL   Total Protein 7.3 6.5 - 8.1 g/dL   Albumin 3.9 3.5 - 5.0 g/dL   AST 16 15 - 41 U/L   ALT 12 (L) 14 - 54 U/L   Alkaline Phosphatase 44 38 - 126 U/L   Total Bilirubin 0.2 (L) 0.3 - 1.2 mg/dL   GFR calc non Af Amer >60 >60 mL/min   GFR calc Af Amer >60 >60 mL/min    Comment: (NOTE) The eGFR has been calculated using the CKD EPI equation. This calculation has  not been validated in all clinical situations. eGFR's persistently <60 mL/min signify possible Chronic Kidney Disease.    Anion gap 9 5 - 15   Ethanol     Status: None   Collection Time: 05/31/15  8:46 PM  Result Value Ref Range   Alcohol, Ethyl (B) <5 <5 mg/dL    Comment:        LOWEST DETECTABLE LIMIT FOR SERUM ALCOHOL IS 5 mg/dL FOR MEDICAL PURPOSES ONLY   Salicylate level     Status: None   Collection Time: 05/31/15  8:46 PM  Result Value Ref Range   Salicylate Lvl <1.6 2.8 - 30.0 mg/dL  Acetaminophen level     Status: Abnormal   Collection Time: 05/31/15  8:46 PM  Result Value Ref Range   Acetaminophen (Tylenol), Serum <10 (L) 10 - 30 ug/mL    Comment:        THERAPEUTIC CONCENTRATIONS VARY SIGNIFICANTLY. A RANGE OF 10-30 ug/mL MAY BE AN EFFECTIVE CONCENTRATION FOR MANY PATIENTS. HOWEVER, SOME ARE BEST TREATED AT CONCENTRATIONS OUTSIDE THIS RANGE. ACETAMINOPHEN CONCENTRATIONS >150 ug/mL AT 4 HOURS AFTER INGESTION AND >50 ug/mL AT 12 HOURS AFTER INGESTION ARE OFTEN ASSOCIATED WITH TOXIC REACTIONS.   CBC with Differential     Status: None   Collection Time: 05/31/15  8:46 PM  Result Value Ref Range   WBC 7.4 3.6 - 11.0 K/uL   RBC 3.99 3.80 - 5.20 MIL/uL   Hemoglobin 12.3 12.0 - 16.0 g/dL   HCT 36.4 35.0 - 47.0 %   MCV 91.1 80.0 - 100.0 fL   MCH 30.7 26.0 - 34.0 pg   MCHC 33.7 32.0 - 36.0 g/dL   RDW 14.0 11.5 - 14.5 %   Platelets 289 150 - 440 K/uL   Neutrophils Relative % 57 %   Neutro Abs 4.2 1.4 - 6.5 K/uL   Lymphocytes Relative 27 %   Lymphs Abs 2.0 1.0 - 3.6 K/uL   Monocytes Relative 11 %   Monocytes Absolute 0.8 0.2 - 0.9 K/uL   Eosinophils Relative 4 %   Eosinophils Absolute 0.3 0 - 0.7 K/uL   Basophils Relative 1 %   Basophils Absolute 0.1 0 - 0.1 K/uL    No current facility-administered medications for this encounter.   Current Outpatient Prescriptions  Medication Sig Dispense Refill  . acetaminophen (TYLENOL) 500 MG tablet Take 500 mg by mouth every 4 (four) hours as needed for mild pain, moderate pain, fever or headache.    . ARIPiprazole (ABILIFY) 10 MG tablet Take 1 tablet (10 mg  total) by mouth daily. 30 tablet 0  . azithromycin (ZITHROMAX Z-PAK) 250 MG tablet Take 2 tablets (500 mg) on  Day 1,  followed by 1 tablet (250 mg) once daily on Days 2 through 5. 6 each 0  . diphenoxylate-atropine (LOMOTIL) 2.5-0.025 MG per tablet Take 1 tablet by mouth every 6 (six) hours as needed for diarrhea or loose stools.    Marland Kitchen escitalopram (LEXAPRO) 20 MG tablet Take 1 tablet (20 mg total) by mouth at bedtime. 30 tablet 0  . fenofibrate 54 MG tablet Take 54 mg by mouth daily.    . Fluticasone-Salmeterol (ADVAIR) 100-50 MCG/DOSE AEPB Inhale 1 puff into the lungs every 12 (twelve) hours.     Marland Kitchen guaiFENesin-codeine 100-10 MG/5ML syrup Take 10 mLs by mouth every 4 (four) hours as needed for cough. 180 mL 0  . ibuprofen (ADVIL,MOTRIN) 800 MG tablet Take 1 tablet (800 mg total) by mouth every 8 (eight) hours  as needed. 30 tablet 0  . ketoconazole (NIZORAL) 2 % cream Apply 1 application topically 2 (two) times daily as needed for irritation. To breast and urinal areas    . meloxicam (MOBIC) 15 MG tablet Take 15 mg by mouth daily.    . miconazole (ZEASORB-AF) 2 % powder Apply 1 application topically 2 (two) times daily.    . pantoprazole (PROTONIX) 40 MG tablet Take 40 mg by mouth daily.    . traMADol (ULTRAM) 50 MG tablet Take 50 mg by mouth 2 (two) times daily as needed for moderate pain or severe pain.      Musculoskeletal: Strength & Muscle Tone: within normal limits Gait & Station: normal Patient leans: N/A  Psychiatric Specialty Exam: Review of Systems  Constitutional: Negative.   HENT: Negative.   Eyes: Negative.   Respiratory: Negative.   Cardiovascular: Negative.   Gastrointestinal: Negative.   Musculoskeletal: Negative.   Skin: Negative.   Neurological: Negative.   Psychiatric/Behavioral: Positive for depression and suicidal ideas. Negative for hallucinations, memory loss and substance abuse. The patient is nervous/anxious. The patient does not have insomnia.     Blood  pressure 127/88, pulse 88, temperature 98.6 F (37 C), temperature source Oral, resp. rate 20, weight 93.895 kg (207 lb), SpO2 99 %.Body mass index is 35.51 kg/(m^2).  General Appearance: Casual  Eye Contact::  Fair  Speech:  Slow  Volume:  Decreased  Mood:  Dysphoric  Affect:  Constricted  Thought Process:  Tangential  Orientation:  Full (Time, Place, and Person)  Thought Content:  Rumination  Suicidal Thoughts:  Yes.  without intent/plan  Homicidal Thoughts:  No  Memory:  Immediate;   Fair Recent;   Fair Remote;   Fair  Judgement:  Impaired  Insight:  Shallow  Psychomotor Activity:  EPS  Concentration:  Fair  Recall:  [employer] of Knowledge:Fair  Language: Fair  Akathisia:  No  Handed:  Right  AIMS (if indicated):     Assets:  Desire for Improvement Financial Resources/Insurance Resilience  ADL's:  Intact  Cognition: Impaired,  Mild  Sleep:      Treatment Plan Summary: Plan 50 year old woman who's been to the emergency room multiple pot times just in the past week. Current mental status and complaints of the same as previously. She talks about being suicidal when she is frustrated but has not acted on it and does not actually want to die. Patient has limited coping skills. We talked about how it is her right to live somewhere else that she's having trouble navigating that because of her chronic cognitive impairment. I would strongly encourage her to see if she can get some help from the mental health organization she sees or from her family and also from the group home and trying to coordinate finding a new place to live if that is what she wants. No change to medication. Supportive counseling done. She does not require inpatient hospitalization. Reviewed case with emergency room doctor. Acute suicidal ideation is largely resolved and controlled. She can be released from IVC and discharged  Disposition: Patient does not meet criteria for psychiatric inpatient  admission. Supportive therapy provided about ongoing stressors. Discussed crisis plan, support from social network, calling 911, coming to the Emergency Department, and calling Suicide Hotline.  Alethia Berthold, MD 06/01/2015 3:16 PM

## 2015-06-01 NOTE — ED Provider Notes (Signed)
Patient cleared for discharge by Dr. Toni Amend of psychology  Jene Every, MD 06/01/15 [phone]

## 2015-06-01 NOTE — ED Notes (Signed)
Patient asleep in room. No noted distress or abnormal behavior. Will continue 15 minute checks and observation by security cameras for safety. 

## 2015-06-01 NOTE — ED Notes (Signed)
Pt. Noted sleeping room. No complaints or concerns voiced. No distress or abnormal behavior noted. Will continue to monitor with security cameras. Q 15 minute rounds continue.  

## 2015-06-01 NOTE — ED Provider Notes (Signed)
-----------------------------------------   7:10 AM on 06/01/2015 -----------------------------------------   Blood pressure 133/103, pulse 100, temperature 98.4 F (36.9 C), temperature source Oral, resp. rate 18, weight 207 lb (93.895 kg), SpO2 98 %.  The patient had no acute events since last update.  Calm and cooperative at this time.  Patient awaiting psych evaluation.     Rebecka Apley, MD 06/01/15 [phone]

## 2015-06-01 NOTE — ED Notes (Signed)
Patient with no complaints, watching TV. Anticipating discharge. All security checks maintained.

## 2015-06-01 NOTE — ED Notes (Signed)
Patient sitting quietly in dayroom. She wants to talk with Dr. Toni Amend. Told she would see him soon. Maintained on 15 minute checks and observation by security camera for safety.

## 2015-06-02 ENCOUNTER — Encounter: Payer: Medicare Other | Admitting: Internal Medicine

## 2015-06-02 DIAGNOSIS — L97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 with fat layer exposed: Secondary | ICD-10-CM | POA: Diagnosis not present

## 2015-06-03 NOTE — Progress Notes (Signed)
[name], [name] ([id number]) Visit Report for 06/02/2015 Arrival Information Details Patient Name: [name], CHEESE Date of Service: 06/02/2015 3:30 PM Medical Record Patient Account Number: [id number] Number: Treating RN: Clover Mealy, RN, BSN, [place] Sink Date of Birth/Sex: 05-28-1965 (50 y.o. Female) Other Clinician: Primary Care MCLEAN-SCOCOZZA, Treating ROBSON, MICHAEL Physician: French Ana Physician/Extender: Fabiola Backer, Referring Physician: Delmer Islam in Treatment: 19 Visit Information History Since Last Visit Added or deleted any medications: No Patient Arrived: Ambulatory Any new allergies or adverse reactions: No Arrival Time: 15:38 Had a fall or experienced change in No Accompanied By: caregiver activities of daily living that may affect Transfer Assistance: None risk of falls: Patient Identification Verified: Yes Signs or symptoms of abuse/neglect since last No Secondary Verification Process Yes visito Completed: Hospitalized since last visit: No Patient Requires Transmission-Based No Has Dressing in Place as Prescribed: Yes Precautions: Has Compression in Place as Prescribed: Yes Patient Has Alerts: Yes Pain Present Now: No Patient Alerts: aspirin Electronic Signature(s) Signed: 06/02/2015 5:09:16 PM By: Elpidio Eric BSN, RN Entered By: Elpidio Eric on 06/02/2015 15:40:26 [name] ([id number]) -------------------------------------------------------------------------------- Encounter Discharge Information Details Patient Name: [name] Date of Service: 06/02/2015 3:30 PM Medical Record Patient Account Number: [id number] Number: Treating RN: Clover Mealy RN, BSN, [place] Sink Date of Birth/Sex: 01/22/1966 (50 y.o. Female) Other Clinician: Primary Care MCLEAN-SCOCOZZA, Treating ROBSON, MICHAEL Physician: French Ana Physician/Extender: Fabiola Backer, Referring Physician: Delmer Islam in Treatment: 86 Encounter Discharge Information  Items Discharge Pain Level: 0 Discharge Condition: Stable Ambulatory Status: Ambulatory Discharge Destination: Home Transportation: Private Auto Accompanied By: caregiver Schedule Follow-up Appointment: No Medication Reconciliation completed and provided to Patient/Care No [name]: Provided on Clinical Summary of Care: 06/02/2015 Form Type Recipient Paper Patient [place] Electronic Signature(s) Signed: 06/02/2015 4:39:22 PM By: Gwenlyn Perking Entered By: Gwenlyn Perking on 06/02/2015 16:39:22 [name] ([id number]) -------------------------------------------------------------------------------- Lower Extremity Assessment Details Patient Name: [name] Date of Service: 06/02/2015 3:30 PM Medical Record Patient Account Number: [id number] Number: Treating RN: Clover Mealy RN, BSN, [place] S[facility] Date of Birth/Sex: 26-Jan-1966 (50 y.o. Female) Other Clinician: Primary Care MCLEAN-SCOCOZZA, Treating ROBSON, MICHAEL Physician: French Ana Physician/Extender: Fabiola Backer, Referring Physician: Delmer Islam in Treatment: 19 Edema Assessment Assessed: [Left: No] [Right: No] Edema: [Left: N] [Right: o] Calf Left: Right: Point of Measurement: 36 cm From Medial Instep cm 35.2 cm Ankle Left: Right: Point of Measurement: 9 cm From Medial Instep cm 20.8 cm Vascular Assessment Claudication: Claudication Assessment [Right:None] Pulses: Posterior Tibial Dorsalis Pedis Palpable: [Right:Yes] Extremity colors, hair growth, and conditions: Extremity Color: [Right:Normal] Hair Growth on Extremity: [Right:Yes] Temperature of Extremity: [Right:Warm] Capillary Refill: [Right:< 3 seconds] Toe Nail Assessment Left: Right: Thick: No Discolored: No Deformed: No Improper Length and Hygiene: No Electronic Signature(sNOVALYNN, [name] ([id number]) Signed: 06/02/2015 5:09:16 PM By: Elpidio Eric BSN, RN Entered By: Elpidio Eric on 06/02/2015 15:51:12 [name]  ([id number]) -------------------------------------------------------------------------------- Multi Wound Chart Details Patient Name: [name] Date of Service: 06/02/2015 3:30 PM Medical Record Patient Account Number: [id number] Number: Treating RN: Clover Mealy RN, BSN, [place] Sink Date of Birth/Sex: 1965-10-23 (50 y.o. Female) Other Clinician: Primary Care MCLEAN-SCOCOZZA, Treating ROBSON, MICHAEL Physician: French Ana Physician/Extender: Fabiola Backer, Referring Physician: Delmer Islam in Treatment: 19 Vital Signs Height(in): 64 Pulse(bpm): 80 Weight(lbs): 215 Blood Pressure 109/61 (mmHg): Body Mass Index(BMI): 37 Temperature(F): 98.9 Respiratory Rate 18 (breaths/min): Photos: [1:No Photos] [N/A:N/A] Wound Location: [1:Right, Medial Lower Leg] [N/A:N/A] Wounding Event: [1:Bite] [N/A:N/A] Primary Etiology: [1:Infection - not elsewhere classified] [N/A:N/A] Date Acquired: [1:12/28/2014] [N/A:N/A] Weeks of Treatment: [1:19] [N/A:N/A] Wound Status: [1:Open] [N/A:N/A] Measurements L x W  x D 0.6x0.3x0.1 [N/A:N/A] (cm) Area (cm) : [1:0.141] [N/A:N/A] Volume (cm) : [1:0.014] [N/A:N/A] % Reduction in Area: [1:97.60%] [N/A:N/A] % Reduction in Volume: 97.60% [N/A:N/A] Classification: [1:Full Thickness Without Exposed Support Structures] [N/A:N/A] Periwound Skin Texture: No Abnormalities Noted [N/A:N/A] Periwound Skin [1:No Abnormalities Noted] [N/A:N/A] Moisture: Periwound Skin Color: No Abnormalities Noted [N/A:N/A] Tenderness on [1:No] [N/A:N/A] Treatment Notes [name], [name] ([id number]) Electronic Signature(s) Signed: 06/02/2015 5:09:16 PM By: Elpidio Eric BSN, RN Entered By: Elpidio Eric on 06/02/2015 16:23:51 [name] ([id number]) -------------------------------------------------------------------------------- Multi-Disciplinary Care Plan Details Patient Name: [name] Date of Service: 06/02/2015 3:30 PM Medical Record Patient Account  Number: [id number] Number: Treating RN: Clover Mealy RN, BSN, [place] Sink Date of Birth/Sex: 1965-05-15 (50 y.o. Female) Other Clinician: Primary Care MCLEAN-SCOCOZZA, Treating ROBSON, MICHAEL Physician: French Ana Physician/Extender: Fabiola Backer, Referring Physician: Delmer Islam in Treatment: 78 Active Inactive Orientation to the Wound Care Program Nursing Diagnoses: Knowledge deficit related to the wound healing center program Goals: Patient/caregiver will verbalize understanding of the Wound Healing Center Program Date Initiated: 01/20/2015 Goal Status: Active Interventions: Provide education on orientation to the wound center Notes: Soft Tissue Infection Nursing Diagnoses: Potential for infection: soft tissue Goals: Patient will remain free of wound infection Date Initiated: 01/20/2015 Goal Status: Active Interventions: Assess signs and symptoms of infection every visit Treatment Activities: Culture and sensitivity : 06/02/2015 Notes: Wound/Skin Impairment [name], [name] ([id number]) Nursing Diagnoses: Impaired tissue integrity Goals: Ulcer/skin breakdown will heal within 14 weeks Date Initiated: 01/20/2015 Goal Status: Active Interventions: Assess patient/caregiver ability to obtain necessary supplies Notes: Electronic Signature(s) Signed: 06/02/2015 5:09:16 PM By: Elpidio Eric BSN, RN Entered By: Elpidio Eric on 06/02/2015 16:23:44 [name] ([id number]) -------------------------------------------------------------------------------- Pain Assessment Details Patient Name: [name] Date of Service: 06/02/2015 3:30 PM Medical Record Patient Account Number: [id number] Number: Treating RN: Clover Mealy RN, BSN, [place] Sink Date of Birth/Sex: [birth year] (50 y.o. Female) Other Clinician: Primary Care MCLEAN-SCOCOZZA, Treating ROBSON, MICHAEL Physician: French Ana Physician/Extender: Fabiola Backer, Referring Physician: Delmer Islam in Treatment:  57 Active Problems Location of Pain Severity and Description of Pain Patient Has Paino No Site Locations Pain Management and Medication Current Pain Management: Electronic Signature(s) Signed: 06/02/2015 5:09:16 PM By: Elpidio Eric BSN, RN Entered By: Elpidio Eric on 06/02/2015 15:40:34 [name] ([id number]) -------------------------------------------------------------------------------- Patient/Caregiver Education Details Patient Name: [name] Date of Service: 06/02/2015 3:30 PM Medical Record Patient Account Number: [id number] Number: Treating RN: Clover Mealy RN, BSN, [place] Sink Date of Birth/Gender: December 13, 1965 (50 y.o. Female) Other Clinician: Primary Care MCLEAN-SCOCOZZA, Treating ROBSON, MICHAEL Physician: French Ana Physician/Extender: Fabiola Backer, Weeks in Treatment: 5 Referring Physician: TRACY Education Assessment Education Provided To: Patient Education Topics Provided Basic Hygiene: Methods: Explain/Verbal Responses: State content correctly Welcome To The Wound Care Center: Methods: Explain/Verbal Responses: State content correctly Electronic Signature(s) Signed: 06/02/2015 5:09:16 PM By: Elpidio Eric BSN, RN Entered By: Elpidio Eric on 06/02/2015 16:39:13 [name] ([id number]) -------------------------------------------------------------------------------- Wound Assessment Details Patient Name: [name] Date of Service: 06/02/2015 3:30 PM Medical Record Patient Account Number: [id number] Number: Treating RN: Clover Mealy RN, BSN, [place] Sink Date of Birth/Sex: 1966-03-06 (50 y.o. Female) Other Clinician: Primary Care MCLEAN-SCOCOZZA, Treating ROBSON, MICHAEL Physician: French Ana Physician/Extender: Fabiola Backer, Referring Physician: Delmer Islam in Treatment: 19 Wound Status Wound Number: 1 Primary Etiology: Infection - not elsewhere classified Wound Location: Right, Medial Lower Leg Wound Status: Open Wounding Event:  Bite Date Acquired: 12/28/2014 Weeks Of Treatment: 19 Clustered Wound: No Wound Measurements Length: (cm) 0.6 Width: (cm) 0.3 Depth: (cm) 0.1 Area: (cm) 0.141 Volume: (cm) 0.014 % Reduction in Area:  97.6% % Reduction in Volume: 97.6% Wound Description Full Thickness Without Exposed Classification: Support Structures Periwound Skin Texture Texture Color No Abnormalities Noted: No No Abnormalities Noted: No Moisture No Abnormalities Noted: No Treatment Notes Wound #1 (Right, Medial Lower Leg) 1. Cleansed with: Cleanse wound with antibacterial soap and water 2. Anesthetic Topical Lidocaine 4% cream to wound bed prior to debridement 4. Dressing Applied: Promogran 5. Secondary Dressing Applied ABD Pad [name], [name] ([id number]) 7. Secured with 3 Layer Compression System - Right Lower Extremity Notes unna to anchor Electronic Signature(s) Signed: 06/02/2015 5:09:16 PM By: Elpidio Eric BSN, RN Entered By: Elpidio Eric on 06/02/2015 15:56:20 [name] ([id number]) -------------------------------------------------------------------------------- Vitals Details Patient Name: [name] Date of Service: 06/02/2015 3:30 PM Medical Record Patient Account Number: [id number] Number: Treating RN: Clover Mealy RN, BSN, [place] Sink Date of Birth/Sex: Feb 18, 1966 (50 y.o. Female) Other Clinician: Primary Care MCLEAN-SCOCOZZA, Treating ROBSON, MICHAEL Physician: French Ana Physician/Extender: Fabiola Backer, Referring Physician: Delmer Islam in Treatment: 19 Vital Signs Time Taken: 15:42 Temperature (F): 98.9 Height (in): 64 Pulse (bpm): 80 Weight (lbs): 215 Respiratory Rate (breaths/min): 18 Body Mass Index (BMI): 36.9 Blood Pressure (mmHg): 109/61 Reference Range: 80 - 120 mg / dl Electronic Signature(s) Signed: 06/02/2015 5:09:16 PM By: Elpidio Eric BSN, RN Entered By: Elpidio Eric on 06/02/2015 15:44:38

## 2015-06-03 NOTE — Progress Notes (Signed)
DOSHIA, DALIA ([id number]) Visit Report for 06/02/2015 Chief Complaint Document Details Patient Name: [name], [name] Date of Service: 06/02/2015 3:30 PM Medical Record Patient Account Number: [id number] Number: Treating RN: Clover Mealy, RN, BSN, [place] Sink Date of Birth/Sex: Dec 09, 1965 (51 y.o. Female) Other Clinician: Primary Care MCLEAN-SCOCOZZA, Treating Rondal Vandevelde Physician: French Ana Physician/Extender: Fabiola Backer, Referring Physician: Delmer Islam in Treatment: 24 Information Obtained from: Patient Chief Complaint Right calf ulcer, status post I and D of abscess. Electronic Signature(s) Signed: 06/02/2015 5:04:49 PM By: Baltazar Najjar MD Entered By: Baltazar Najjar on 06/02/2015 16:55:00 [name] ([id number]) -------------------------------------------------------------------------------- HPI Details Patient Name: [name] Date of Service: 06/02/2015 3:30 PM Medical Record Patient Account Number: [id number] Number: Treating RN: Clover Mealy RN, BSN, [place] Sink Date of Birth/Sex: [birth year] (50 y.o. Female) Other Clinician: Primary Care MCLEAN-SCOCOZZA, Treating Dom Haverland Physician: French Ana Physician/Extender: Fabiola Backer, Referring Physician: Delmer Islam in Treatment: 6 History of Present Illness HPI Description: Pleasant 50 year old with history of borderline personality disorder and CP. No history of diabetes or peripheral arterial disease. Right ABI 1.2. She developed an ulceration on her right anterior calf secondary to an insect bite. This progressed to an abscess, and she underwent I and D on 01/02/2015 by Dr. Michela Pitcher. Cultures grew oxacillin sensitive staph aureus. She completed a course of clindamycin. She lives in a group home. Punch biopsy 03/24/2015 showed no evidence for malignancy. Negative for granulomatous inflammation and vasculitis. Tolerating 3 layer compression bandage with silver alginate 3 times weekly. Epifix #1  applied 04/28/2015. Showing significant improvement recently. She returns to clinic for follow-up and is without new complaints. No significant pain. No fever or chills. Minimal drainage. 05/12/2015 -- here for the third application of a Epifix today. 05/19/15; this is a 50 year old woman who lives in an assisted living. She apparently had a severe episode of cellulitis of the right leg in September. Since then she had a wound on her right anterior leg. Punch biopsy in early December was negative for malignancy, vasculitis. She had her third Epifix place last week. His appears to be doing well. Per the staff no further epifixe planned. The nurse from her facility was with her says there is been tremendous improvement in the condition of this wound, I understand that she was in hospital this week for psychiatric issues nothing to do with the wound 06/02/15 continued improvement in the condition of this wound. She has only a small open area remaining. I have reviewed the history this was felt to be secondary to an insect bite and secondary abscess. Cultures of this grew methicillin sensitive staph aureus. She completed a course of antibiotics. She was treated with a third application of Epifix ending in late January. Electronic Signature(s) Signed: 06/02/2015 5:04:49 PM By: Baltazar Najjar MD Entered By: Baltazar Najjar on 06/02/2015 16:56:07 [name] ([id number]) -------------------------------------------------------------------------------- Physical Exam Details Patient Name: [name] Date of Service: 06/02/2015 3:30 PM Medical Record Patient Account Number: [id number] Number: Treating RN: Clover Mealy RN, BSN, [place] Sink Date of Birth/Sex: Sep 13, 1965 (50 y.o. Female) Other Clinician: Primary Care MCLEAN-SCOCOZZA, Treating Vianna Venezia Physician: French Ana Physician/Extender: Fabiola Backer, Referring Physician: Delmer Islam in Treatment: 19 Constitutional Sitting or  standing Blood Pressure is within target range for patient.. Pulse regular and within target range for patient.Marland Kitchen Respirations regular, non-labored and within target range.. Temperature is normal and within the target range for the patient.Marland Kitchen Respiratory Respiratory effort is easy and symmetric bilaterally. Rate is normal at rest and on room air.. Bilateral breath sounds are clear and equal in all  lobes with no wheezes, rales or rhonchi.. Cardiovascular Heart rhythm and rate regular, without murmur or gallop.. Pedal pulses palpable and strong bilaterally.. Extremities are free of varicosities, clubbing or edema. Peripheral pulses strong and equal. Capillary refill < 3 seconds.. Notes Wound exam; we continue to make good progress towards wound closure here. There is only a small open area remaining. This is granulated. No evidence of concurrent infection or ischemia. She has no major changes of venous insufficiency Electronic Signature(s) Signed: 06/02/2015 5:04:49 PM By: Baltazar Najjar MD Entered By: Baltazar Najjar on 06/02/2015 16:57:23 [name] ([id number]) -------------------------------------------------------------------------------- Physician Orders Details Patient Name: [name] Date of Service: 06/02/2015 3:30 PM Medical Record Patient Account Number: [id number] Number: Treating RN: Clover Mealy RN, BSN, [place] Sink Date of Birth/Sex: May 08, 1965 (50 y.o. Female) Other Clinician: Primary Care MCLEAN-SCOCOZZA, Treating Teofil Maniaci Physician: French Ana Physician/Extender: Fabiola Backer, Referring Physician: Delmer Islam in Treatment: 8 Verbal / Phone Orders: Yes Clinician: Afful, RN, BSN, Rita Read Back and Verified: Yes Diagnosis Coding Wound Cleansing Wound #1 Right,Medial Lower Leg o Cleanse wound with mild soap and water Anesthetic Wound #1 Right,Medial Lower Leg o Topical Lidocaine 4% cream applied to wound bed prior to debridement Skin  Barriers/Peri-Wound Care Wound #1 Right,Medial Lower Leg o Skin Prep Primary Wound Dressing Wound #1 Right,Medial Lower Leg o Promogran Secondary Dressing Wound #1 Right,Medial Lower Leg o ABD pad Dressing Change Frequency Wound #1 Right,Medial Lower Leg o Change dressing every week Follow-up Appointments Wound #1 Right,Medial Lower Leg o Return Appointment in 1 week. Edema Control Wound #1 Right,Medial Lower Leg o 3 Layer Compression System - Right Lower Extremity - unna wrap at the top to anchor o Elevate legs to the level of the heart and pump ankles as often as possible [name], [name] ([id number]) Electronic Signature(s) Signed: 06/02/2015 5:04:49 PM By: Baltazar Najjar MD Signed: 06/02/2015 5:09:16 PM By: Elpidio Eric BSN, RN Entered By: Elpidio Eric on 06/02/2015 16:24:29 [name] ([id number]) -------------------------------------------------------------------------------- Problem List Details Patient Name: [name] Date of Service: 06/02/2015 3:30 PM Medical Record Patient Account Number: [id number] Number: Treating RN: Clover Mealy RN, BSN, [name] Sink Date of Birth/Sex: [birth year] (50 y.o. 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 S81.801S Unspecified open wound, right lower leg, sequela 01/20/2015 Yes F60.3 Borderline personality disorder 01/20/2015 Yes Inactive Problems Resolved Problems ICD-10 Code Description Active Date Resolved Date L03.115 Cellulitis of right lower limb 04/07/2015 04/07/2015 Electronic Signature(s) Signed: 06/02/2015 5:04:49 PM By: Baltazar Najjar MD Entered By: Baltazar Najjar on 06/02/2015 16:54:35 [name] ([id number]) -------------------------------------------------------------------------------- Progress Note Details Patient Name: [name] Date of Service: 06/02/2015 3:30 PM Medical Record Patient Account Number: [id number] Number: Treating RN: Clover Mealy RN, BSN, [place] Sink Date of Birth/Sex: 05-23-1965 (50 y.o. Female) Other Clinician: Primary Care MCLEAN-SCOCOZZA, Treating Dvaughn Fickle Physician: French Ana Physician/Extender: Fabiola Backer, Referring Physician: Delmer Islam in Treatment: 31 Subjective Chief Complaint Information obtained from Patient Right calf ulcer, status post I and D of abscess. History of Present Illness (HPI) Pleasant 50 year old with history of borderline personality disorder and CP. No history of diabetes or peripheral arterial disease. Right ABI 1.2. She developed an ulceration on her right anterior calf secondary to an insect bite. This progressed to an abscess, and she underwent I and D on 01/02/2015 by Dr. Michela Pitcher. Cultures grew oxacillin sensitive  staph aureus. She completed a course of clindamycin. She lives in a group home. Punch biopsy 03/24/2015 showed no evidence for malignancy. Negative for granulomatous inflammation and vasculitis. Tolerating 3 layer compression bandage with silver alginate 3 times weekly. Epifix #1 applied 04/28/2015. Showing significant improvement recently. She returns to clinic for follow-up and is without new complaints. No significant pain. No fever or chills. Minimal drainage. 05/12/2015 -- here for the third application of a Epifix today. 05/19/15; this is a 50 year old woman who lives in an assisted living. She apparently had a severe episode of cellulitis of the right leg in September. Since then she had a wound on her right anterior leg. Punch biopsy in early December was negative for malignancy, vasculitis. She had her third Epifix place last week. His appears to be doing well. Per the staff no  further epifixe planned. The nurse from her facility was with her says there is been tremendous improvement in the condition of this wound, I understand that she was in hospital this week for psychiatric issues nothing to do with the wound 06/02/15 continued improvement in the condition of this wound. She has only a small open area remaining. I have reviewed the history this was felt to be secondary to an insect bite and secondary abscess. Cultures of this grew methicillin sensitive staph aureus. She completed a course of antibiotics. She was treated with a third application of Epifix ending in late January. Objective [name], [name] ([id number]) Constitutional Sitting or standing Blood Pressure is within target range for patient.. Pulse regular and within target range for patient.Marland Kitchen Respirations regular, non-labored and within target range.. Temperature is normal and within the target range for the patient.. Vitals Time Taken: 3:42 PM, Height: 64 in, Weight: 215 lbs, BMI: 36.9, Temperature: 98.9 F, Pulse: 80 bpm, Respiratory Rate: 18 breaths/min, Blood Pressure: 109/61 mmHg. Respiratory Respiratory effort is easy and symmetric bilaterally. Rate is normal at rest and on room air.. Bilateral breath sounds are clear and equal in all lobes with no wheezes, rales or rhonchi.. Cardiovascular Heart rhythm and rate regular, without murmur or gallop.. Pedal pulses palpable and strong bilaterally.. Extremities are free of varicosities, clubbing or edema. Peripheral pulses strong and equal. Capillary refill < 3 seconds.. General Notes: Wound exam; we continue to make good progress towards wound closure here. There is only a small open area remaining. This is granulated. No evidence of concurrent infection or ischemia. She has no major changes of venous insufficiency Integumentary (Hair, Skin) Wound #1 status is Open. Original cause of wound was Bite. The wound is located on the Right,Medial Lower  Leg. The wound measures 0.6cm length x 0.3cm width x 0.1cm depth; 0.141cm^2 area and 0.014cm^3 volume. Assessment Active Problems ICD-10 L97.212 - Non-pressure chronic ulcer of right calf with fat layer exposed S81.801S - Unspecified open wound, right lower leg, sequela F60.3 - Borderline personality disorder Plan Wound Cleansing: Wound #1 Right,Medial Lower Leg: [name], [name] ([id number]) Cleanse wound with mild soap and water Anesthetic: Wound #1 Right,Medial Lower Leg: Topical Lidocaine 4% cream applied to wound bed prior to debridement Skin Barriers/Peri-Wound Care: Wound #1 Right,Medial Lower Leg: Skin Prep Primary Wound Dressing: Wound #1 Right,Medial Lower Leg: Promogran Secondary Dressing: Wound #1 Right,Medial Lower Leg: ABD pad Dressing Change Frequency: Wound #1 Right,Medial Lower Leg: Change dressing every week Follow-up Appointments: Wound #1 Right,Medial Lower Leg: Return Appointment in 1 week. Edema Control: Wound #1 Right,Medial Lower Leg: 3 Layer Compression System - Right Lower Extremity - unna wrap at the  top to anchor Elevate legs to the level of the heart and pump ankles as often as possible o #1 we continue with Promogran, ABD and a Profore light wrap #2 although I'm always suspicious about insect bite histories, it is clear that this became secondarily infected resulting in a really substantial wound. This is now a lot better. Electronic Signature(s) Signed: 06/02/2015 5:04:49 PM By: Baltazar Najjar MD Entered By: Baltazar Najjar on 06/02/2015 16:59:22 [name] ([id number]) -------------------------------------------------------------------------------- SuperBill Details Patient Name: [name] Date of Service: 06/02/2015 Medical Record Patient Account Number: [id number] Number: Treating RN: Clover Mealy RN, BSN, [place] Sink Date of Birth/Sex: 03/21/1966 (50 y.o. Female) Other Clinician: Primary Care MCLEAN-SCOCOZZA, Treating  Suzane Vanderweide Physician: French Ana Physician/Extender: Fabiola Backer, Weeks in Treatment: 19 Referring Physician: TRACY Diagnosis Coding ICD-10 Codes Code Description [id number] Non-pressure chronic ulcer of right calf with fat layer exposed S81.801S Unspecified open wound, right lower leg, sequela F60.3 Borderline personality disorder Facility Procedures CPT4: Description Modifier Quantity Code [id number] (Facility Use Only) [phone] - APPLY MULTLAY COMPRS LWR RT 1 LEG Physician Procedures CPT4 Code: [id number] Description: 99213 - WC PHYS LEVEL 3 - EST PT ICD-10 Description Diagnosis L97.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 with fat Modifier: layer exposed Quantity: 1 Electronic Signature(s) Signed: 06/02/2015 5:09:04 PM By: Elpidio Eric BSN, RN Signed: 06/03/2015 5:30:43 AM By: Baltazar Najjar MD Previous Signature: 06/02/2015 5:04:49 PM Version By: Baltazar Najjar MD Entered By: Elpidio Eric on 06/02/2015 17:09:04

## 2015-06-04 ENCOUNTER — Encounter: Payer: Self-pay | Admitting: Emergency Medicine

## 2015-06-04 ENCOUNTER — Emergency Department
Admission: EM | Admit: 2015-06-04 | Discharge: 2015-06-04 | Disposition: A | Discharge status: Home / Self Care | Payer: Medicare Other | Attending: Emergency Medicine | Admitting: Emergency Medicine

## 2015-06-04 DIAGNOSIS — Z7951 Long term (current) use of inhaled steroids: Secondary | ICD-10-CM | POA: Insufficient documentation

## 2015-06-04 DIAGNOSIS — F1310 Sedative, hypnotic or anxiolytic abuse, uncomplicated: Secondary | ICD-10-CM | POA: Insufficient documentation

## 2015-06-04 DIAGNOSIS — F329 Major depressive disorder, single episode, unspecified: Secondary | ICD-10-CM | POA: Insufficient documentation

## 2015-06-04 DIAGNOSIS — Z008 Encounter for other general examination: Secondary | ICD-10-CM | POA: Diagnosis present

## 2015-06-04 DIAGNOSIS — F603 Borderline personality disorder: Secondary | ICD-10-CM | POA: Insufficient documentation

## 2015-06-04 DIAGNOSIS — F419 Anxiety disorder, unspecified: Secondary | ICD-10-CM

## 2015-06-04 DIAGNOSIS — R45851 Suicidal ideations: Secondary | ICD-10-CM | POA: Insufficient documentation

## 2015-06-04 DIAGNOSIS — Z791 Long term (current) use of non-steroidal anti-inflammatories (NSAID): Secondary | ICD-10-CM | POA: Diagnosis not present

## 2015-06-04 DIAGNOSIS — F32A Depression, unspecified: Secondary | ICD-10-CM

## 2015-06-04 DIAGNOSIS — Z79899 Other long term (current) drug therapy: Secondary | ICD-10-CM | POA: Diagnosis not present

## 2015-06-04 DIAGNOSIS — F259 Schizoaffective disorder, unspecified: Secondary | ICD-10-CM | POA: Insufficient documentation

## 2015-06-04 DIAGNOSIS — Z7952 Long term (current) use of systemic steroids: Secondary | ICD-10-CM | POA: Insufficient documentation

## 2015-06-04 DIAGNOSIS — Z880 Allergy status to penicillin: Secondary | ICD-10-CM | POA: Diagnosis not present

## 2015-06-04 LAB — CBC
HEMATOCRIT: 35.1 % (ref 35.0–47.0)
HEMOGLOBIN: 11.9 g/dL — AB (ref 12.0–16.0)
MCH: 31.2 pg (ref 26.0–34.0)
MCHC: 34 g/dL (ref 32.0–36.0)
MCV: 91.9 fL (ref 80.0–100.0)
Platelets: 279 10*3/uL (ref 150–440)
RBC: 3.81 MIL/uL (ref 3.80–5.20)
RDW: 14.1 % (ref 11.5–14.5)
WBC: 6.8 10*3/uL (ref 3.6–11.0)

## 2015-06-04 LAB — COMPREHENSIVE METABOLIC PANEL
ALBUMIN: 3.8 g/dL (ref 3.5–5.0)
ALK PHOS: 42 U/L (ref 38–126)
ALT: 10 U/L — AB (ref 14–54)
ANION GAP: 8 (ref 5–15)
AST: 15 U/L (ref 15–41)
BILIRUBIN TOTAL: 0.2 mg/dL — AB (ref 0.3–1.2)
BUN: 23 mg/dL — AB (ref 6–20)
CALCIUM: 8.9 mg/dL (ref 8.9–10.3)
CO2: 26 mmol/L (ref 22–32)
CREATININE: 0.75 mg/dL (ref 0.44–1.00)
Chloride: 110 mmol/L (ref 101–111)
GFR calc Af Amer: >60 mL/min (ref >60)
GFR calc non Af Amer: >60 mL/min (ref >60)
GLUCOSE: 115 mg/dL — AB (ref 65–99)
Potassium: 3.6 mmol/L (ref 3.5–5.1)
Sodium: 144 mmol/L (ref 135–145)
TOTAL PROTEIN: 7.3 g/dL (ref 6.5–8.1)

## 2015-06-04 LAB — ETHANOL: Alcohol, Ethyl (B): <5 mg/dL (ref <5)

## 2015-06-04 LAB — URINE DRUG SCREEN, QUALITATIVE (ARMC ONLY)
Amphetamines, Ur Screen: NOT DETECTED
BARBITURATES, UR SCREEN: NOT DETECTED
BENZODIAZEPINE, UR SCRN: POSITIVE — AB
COCAINE METABOLITE, UR ~~LOC~~: NOT DETECTED
Cannabinoid 50 Ng, Ur ~~LOC~~: NOT DETECTED
MDMA (Ecstasy)Ur Screen: NOT DETECTED
Methadone Scn, Ur: NOT DETECTED
OPIATE, UR SCREEN: NOT DETECTED
PHENCYCLIDINE (PCP) UR S: NOT DETECTED
Tricyclic, Ur Screen: POSITIVE — AB

## 2015-06-04 LAB — ACETAMINOPHEN LEVEL: Acetaminophen (Tylenol), Serum: <10 ug/mL — ABNORMAL LOW (ref 10–30)

## 2015-06-04 LAB — SALICYLATE LEVEL: Salicylate Lvl: <4 mg/dL (ref 2.8–30.0)

## 2015-06-04 NOTE — ED Provider Notes (Signed)
[facility] Emergency Department Provider Note  ____________________________________________    I have reviewed the triage vital signs and the nursing notes.   HISTORY  Chief Complaint Suicidal ideation   HPI [name] is a 50 y.o. female who presents with complaints of suicidal ideation. She says that she wants to run out of her car. This patient is frequently in our emergency department for similar complaints. Most of this stems from her dislike of her group home which she readily admits. She tells me that she needs to stay here "over the weekend "until she goes to a new group home on Monday.      Past Medical History  Diagnosis Date  . Asthma   . Anxiety   . GERD (gastroesophageal reflux disease)   . Mild cognitive impairment   . Cerebral palsy (HCC)   . Borderline personality disorder   . Depression   . Wound abscess   . Schizoaffective disorder [facility])     Patient Active Problem List   Diagnosis Date Noted  . Anxiety 12/31/2014  . Cellulitis and abscess of leg 12/31/2014  . 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 GERD (gastroesophageal reflux disease) 09/24/2014  . COPD (chronic obstructive pulmonary disease) (HCC) 09/24/2014  . Borderline personality disorder 09/24/2014  . Major depressive disorder, recurrent episode, moderate (HCC) 09/24/2014  . Cerebral palsy (HCC) 08/31/2014    Past Surgical History  Procedure Laterality Date  . Cholecystectomy    . Tonsillectomy    . Incision and drainage abscess Right 01/02/2015    Procedure: INCISION AND DRAINAGE ABSCESS;  Surgeon: Tiney Rouge III, MD;  Location: [facility] ORS;  Service: General;  Laterality: Right;    Current Outpatient Rx  Name  Route  Sig  Dispense  Refill  . acetaminophen (TYLENOL) 500 MG tablet   Oral   Take 500 mg by mouth every 4 (four) hours as needed for mild pain, moderate pain, fever or headache.         . ARIPiprazole (ABILIFY) 10 MG tablet   Oral   Take 1  tablet (10 mg total) by mouth daily.   30 tablet   0   . azithromycin (ZITHROMAX Z-PAK) 250 MG tablet      Take 2 tablets (500 mg) on  Day 1,  followed by 1 tablet (250 mg) once daily on Days 2 through 5.   6 each   0   . diphenoxylate-atropine (LOMOTIL) 2.5-0.025 MG per tablet   Oral   Take 1 tablet by mouth every 6 (six) hours as needed for diarrhea or loose stools.         Marland Kitchen escitalopram (LEXAPRO) 20 MG tablet   Oral   Take 1 tablet (20 mg total) by mouth at bedtime.   30 tablet   0   . fenofibrate 54 MG tablet   Oral   Take 54 mg by mouth daily.         . Fluticasone-Salmeterol (ADVAIR) 100-50 MCG/DOSE AEPB   Inhalation   Inhale 1 puff into the lungs every 12 (twelve) hours.          Marland Kitchen guaiFENesin-codeine 100-10 MG/5ML syrup   Oral   Take 10 mLs by mouth every 4 (four) hours as needed for cough.   180 mL   0   . ibuprofen (ADVIL,MOTRIN) 800 MG tablet   Oral   Take 1 tablet (800 mg total) by mouth every 8 (eight) hours as needed.   30 tablet   0   .  ketoconazole (NIZORAL) 2 % cream   Topical   Apply 1 application topically 2 (two) times daily as needed for irritation. To breast and urinal areas         . meloxicam (MOBIC) 15 MG tablet   Oral   Take 15 mg by mouth daily.         . miconazole (ZEASORB-AF) 2 % powder   Topical   Apply 1 application topically 2 (two) times daily.         . pantoprazole (PROTONIX) 40 MG tablet   Oral   Take 40 mg by mouth daily.         . traMADol (ULTRAM) 50 MG tablet   Oral   Take 50 mg by mouth 2 (two) times daily as needed for moderate pain or severe pain.           Allergies Penicillins  Family History  Problem Relation Age of Onset  . Diabetes Mother   . Heart attack Father     Social History Social History  Substance Use Topics  . Smoking status: Never Smoker   . Smokeless tobacco: Never Used  . Alcohol Use: No    Review of Systems  Constitutional: Negative for fever. Eyes:  Negative for blurry vision ENT: Negative for sore throat Cardiovascular: Negative for chest pain. Respiratory: Negative for cough Gastrointestinal: Negative for abdominal pain Genitourinary: Negative for dysuria. Musculoskeletal: Negative for back pain. Skin: Negative for rash. Neurological: Negative for headaches  Psychiatric: Reports suicidal ideation    ____________________________________________   PHYSICAL EXAM:  VITAL SIGNS: ED Triage Vitals  Enc Vitals Group     BP 06/04/15 1834 113/71 mmHg     Pulse Rate 06/04/15 1834 91     Resp 06/04/15 1834 20     Temp 06/04/15 1834 98.3 F (36.8 C)     Temp Source 06/04/15 1834 Oral     SpO2 06/04/15 1834 94 %     Weight 06/04/15 1834 207 lb (93.895 kg)     Height 06/04/15 1834 5\' 3"  (1.6 m)     Head Cir --      Peak Flow --      Pain Score --      Pain Loc --      Pain Edu? --      Excl. in GC? --      Constitutional: Alert and oriented. Well appearing and in no distress. Eyes: Conjunctivae are normal.  ENT   Head: Normocephalic and atraumatic.   Mouth/Throat: Mucous membranes are moist. Cardiovascular: Normal rate, regular rhythm.  Respiratory: Normal respiratory effort without tachypnea nor retractions. Breath sounds are clear and equal bilaterally.  Gastrointestinal: Soft and non-tender in all quadrants. No distention. There is no CVA tenderness. Genitourinary: deferred Musculoskeletal: Nontender with normal range of motion in all extremities. No lower extremity tenderness nor edema. Neurologic:  Normal speech and language. No gross focal neurologic deficits are appreciated. Skin:  Skin is warm, dry and intact. No rash noted. Psychiatric: Mood and affect are normal. Patient exhibits appropriate insight and judgment.  ____________________________________________    LABS (pertinent positives/negatives)  Labs Reviewed  COMPREHENSIVE METABOLIC PANEL - Abnormal; Notable for the following:    Glucose, Bld  115 (*)    BUN 23 (*)    ALT 10 (*)    Total Bilirubin 0.2 (*)    All other components within normal limits  ACETAMINOPHEN LEVEL - Abnormal; Notable for the following:    Acetaminophen (Tylenol), Serum <10 (*)  All other components within normal limits  CBC - Abnormal; Notable for the following:    Hemoglobin 11.9 (*)    All other components within normal limits  ETHANOL  SALICYLATE LEVEL  URINE DRUG SCREEN, QUALITATIVE ([facility] ONLY)    ____________________________________________   EKG  None ____________________________________________    RADIOLOGY I have personally reviewed any xrays that were ordered on this patient: None  ____________________________________________   PROCEDURES  Procedure(s) performed: none  Critical Care performed: none  ____________________________________________   INITIAL IMPRESSION / ASSESSMENT AND PLAN / ED COURSE  Pertinent labs & imaging results that were available during my care of the patient were reviewed by me and considered in my medical decision making (see chart for details).  Patient with multiple visits to our ED for reported suicidal ideation. She has been cleared by psych numerous times. I do not believe that she is actually suicidal rather I believe she is here to get away from her group home which is unfortunate. I will consult telepsych for their input  ----------------------------------------- 9:59 PM on 06/04/2015 -----------------------------------------  Psychiatry recommends discharge which I agree with  ____________________________________________   FINAL CLINICAL IMPRESSION(S) / ED DIAGNOSES  Final diagnoses:  Anxiety  Depression     Jene Every, MD 06/04/15 2324

## 2015-06-04 NOTE — ED Notes (Signed)
Brought in vol by [employer] PD   States she having some S/I  States she wants to run out in front of a car

## 2015-06-04 NOTE — ED Notes (Signed)
Patient with no complaints at this time. Respirations even and unlabored. Skin warm/dry. Discharge instructions reviewed with patient at this time. Patient given opportunity to voice concerns/ask questions. Patient discharged at this time and left Emergency Department with steady gait.   

## 2015-06-04 NOTE — Discharge Instructions (Signed)
Suicidal Feelings: How to Help Yourself °Suicide is the taking of one's own life. If you feel as though life is getting too tough to handle and are thinking about suicide, get help right away. To get help: °· Call your local emergency services (911 in the [place] °· Call a suicide hotline to speak with a trained counselor who understands how you are feeling. The following is a list of suicide hotlines in the [place]. For a list of hotlines in [place], visit www.suicide.org/hotlines/international/[place]-suicide-hotlines.html. °¨  1-800-273-TALK (1-800-273-8255). °¨  1-800-SUICIDE (1-800-784-2433). °¨  1-888-628-9454. This is a hotline for Spanish speakers. °¨  1-800-799-4TTY (1-800-799-4889). This is a hotline for TTY users. °¨  1-866-4-U-TREVOR (1-866-488-7386). This is a hotline for lesbian, gay, bisexual, transgender, or questioning youth. °· Contact a crisis center or a local suicide prevention center. To find a crisis center or suicide prevention center: °¨ Call your local hospital, clinic, community service organization, mental health center, social service provider, or health department. Ask for assistance in connecting to a crisis center. °¨ Visit www.suicidepreventionlifeline.org/getinvolved/locator for a list of crisis centers in the [place], or visit www.suicideprevention.ca/thinking-about-suicide/find-a-crisis-centre for a list of centers in [place]. °· Visit the following websites: °¨  National Suicide Prevention Lifeline: www.suicidepreventionlifeline.org °¨  Hopeline: www.hopeline.com °¨  American Foundation for Suicide Prevention: www.afsp.org °¨  The Trevor Project (for lesbian, gay, bisexual, transgender, or questioning youth): www.thetrevorproject.org °HOW CAN I HELP MYSELF FEEL BETTER? °· Promise yourself that you will not do anything drastic when you have suicidal feelings. Remember, there is hope. Many people have gotten through suicidal thoughts and feelings, and you will, too. You may  have gotten through them before, and this proves that you can get through them again. °· Let family, friends, teachers, or counselors know how you are feeling. Try not to isolate yourself from those who care about you. Remember, they will want to help you. Talk with someone every day, even if you do not feel sociable. Face-to-face conversation is best. °· Call a mental health professional and see one regularly. °· Visit your primary health care provider every year. °· Eat a well-balanced diet, and space your meals so you eat regularly. °· Get plenty of rest. °· Avoid alcohol and drugs, and remove them from your home. They will only make you feel worse. °· If you are thinking of taking a lot of medicine, give your medicine to someone who can give it to you one day at a time. If you are on antidepressants and are concerned you will overdose, let your health care provider know so he or she can give you safer medicines. Ask your mental health professional about the possible side effects of any medicines you are taking. °· Remove weapons, poisons, knives, and anything else that could harm you from your home. °· Try to stick to routines. Follow a schedule every day. Put self-care on your schedule. °· Make a list of realistic goals, and cross them off when you achieve them. Accomplishments give a sense of worth. °· Wait until you are feeling better before doing the things you find difficult or unpleasant. °· Exercise if you are able. You will feel better if you exercise for even a half hour each day. °· Go out in the sun or into nature. This will help you recover from depression faster. If you have a favorite place to walk, go there. °· Do the things that have always given you pleasure. Play your favorite music, read a good book, paint a picture, play your favorite instrument, or do anything   else that takes your mind off your depression if it is safe to do. °· Keep your living space well lit. °· When you are feeling well,  write yourself a letter about tips and support that you can read when you are not feeling well. °· Remember that life's difficulties can be sorted out with help. Conditions can be treated. You can work on thoughts and strategies that serve you well. °  °This information is not intended to replace advice given to you by your health care provider. Make sure you discuss any questions you have with your health care provider. °  °Document Released: 10/08/2002 Document Revised: 04/24/2014 Document Reviewed: 07/29/2013 °Elsevier Interactive Patient Education ©2016 Elsevier Inc. ° °

## 2015-06-04 NOTE — BH Assessment (Signed)
Assessment Note  [name] is an 50 y.o. female reporting to the ED voluntarily with suicidal thoughts of walking in front of traffic.  As with previous ED visits, patient's chief complaint is not liking her current group home placement.  She states that she thinks the other residents are "smoking weed" and fears that residents will retaliate against for telling the police.    Pt denies wanting to harm herself.  She denies any drug/lcohol use.  She states that she has an appointment Monday for an interview with a new group home provider.  Pt was encouraged to keep this appointment.  Diagnosis: Depression  Past Medical History:  Past Medical History  Diagnosis Date  . Asthma   . Anxiety   . GERD (gastroesophageal reflux disease)   . Mild cognitive impairment   . Cerebral palsy (HCC)   . Borderline personality disorder   . Depression   . Wound abscess   . Schizoaffective disorder [facility])     Past Surgical History  Procedure Laterality Date  . Cholecystectomy    . Tonsillectomy    . Incision and drainage abscess Right 01/02/2015    Procedure: INCISION AND DRAINAGE ABSCESS;  Surgeon: Tiney Rouge III, MD;  Location: [facility] ORS;  Service: General;  Laterality: Right;    Family History:  Family History  Problem Relation Age of Onset  . Diabetes Mother   . Heart attack Father     Social History:  reports that she has never smoked. She has never used smokeless tobacco. She reports that she does not drink alcohol or use illicit drugs.  Additional Social History:  Alcohol / Drug Use History of alcohol / drug use?: No history of alcohol / drug abuse  CIWA: CIWA-Ar BP: 113/71 mmHg Pulse Rate: 91 COWS:    Allergies:  Allergies  Allergen Reactions  . Penicillins Other (See Comments)    Pt states that she is "just scared to take it".      Home Medications:  (Not in a hospital admission)  OB/GYN Status:  No LMP recorded. Patient has had an implant.  General Assessment  Data Location of Assessment: [facility] ED TTS Assessment: In system Is this a Tele or Face-to-Face Assessment?: Face-to-Face Is this an Initial Assessment or a Re-assessment for this encounter?: Initial Assessment Marital status: Single Maiden name: N/A Is patient pregnant?: No Pregnancy Status: No Living Arrangements: Group Home Can pt return to current living arrangement?: Yes Admission Status: Voluntary Is patient capable of signing voluntary admission?: Yes Referral Source: Self/Family/Friend Insurance type: Medicare  Medical Screening Exam [facility] Walk-in ONLY) Medical Exam completed: Yes  Crisis Care Plan Living Arrangements: Group Home Legal Guardian: Other: (self) Name of Psychiatrist: Dr.Headen Name of Therapist: None  Education Status Is patient currently in school?: No Current Grade: N/a Highest grade of school patient has completed: 10th Grade Name of school: [employer] Contact person: n/a  Risk to self with the past 6 months Suicidal Ideation: Yes-Currently Present Has patient been a risk to self within the past 6 months prior to admission? : Yes Suicidal Intent: No Has patient had any suicidal intent within the past 6 months prior to admission? : Yes Is patient at risk for suicide?: No Suicidal Plan?: Yes-Currently Present Has patient had any suicidal plan within the past 6 months prior to admission? : Yes Specify Current Suicidal Plan: Pt reports plan to walk in front of traffic Access to Means: Yes Specify Access to Suicidal Means: Access to traffic What has been your  use of drugs/alcohol within the last 12 months?: None reported Previous Attempts/Gestures: Yes How many times?: 2 Other Self Harm Risks: None reported Triggers for Past Attempts: Unknown Intentional Self Injurious Behavior: Bruising (Pt bruised her wrist with her bracelet) Comment - Self Injurious Behavior: Pt reports bruising her wrists with her arm Family Suicide History: No Recent stressful  life event(s): Other (Comment) (Pt is unhappy with her living situation) Persecutory voices/beliefs?: No Depression: Yes Depression Symptoms: Loss of interest in usual pleasures Substance abuse history and/or treatment for substance abuse?: No Suicide prevention information given to non-admitted patients: Not applicable  Risk to Others within the past 6 months Homicidal Ideation: No Does patient have any lifetime risk of violence toward others beyond the six months prior to admission? : No Thoughts of Harm to Others: No Current Homicidal Intent: No Current Homicidal Plan: No Access to Homicidal Means: No Identified Victim: N/A History of harm to others?: No Assessment of Violence: None Noted Violent Behavior Description: None identified Does patient have access to weapons?: No Criminal Charges Pending?: No Does patient have a court date: No Is patient on probation?: No  Psychosis Hallucinations: None noted Delusions: None noted  Mental Status Report Appearance/Hygiene: In scrubs, Unremarkable Eye Contact: Good Motor Activity: Freedom of movement Speech: Logical/coherent, Soft, Slow Level of Consciousness: Alert Mood: Anxious, Depressed Affect: Appropriate to circumstance Anxiety Level: Minimal Thought Processes: Coherent, Relevant Judgement: Unimpaired Orientation: Person, Place, Time, Situation Obsessive Compulsive Thoughts/Behaviors: None  Cognitive Functioning Concentration: Normal Memory: Recent Intact, Remote Intact IQ: Below Average Insight: Fair Impulse Control: Fair Appetite: Fair Weight Loss: 0 Weight Gain: 0 Sleep: No Change Total Hours of Sleep: 4 Vegetative Symptoms: None  ADLScreening [facility] Assessment Services) Patient's cognitive ability adequate to safely complete daily activities?: Yes Patient able to express need for assistance with ADLs?: Yes Independently performs ADLs?: Yes (appropriate for developmental age)  Prior Inpatient  Therapy Prior Inpatient Therapy: Yes (brother) Prior Therapy Dates: Unsure, Multiple hospitalizations Prior Therapy Facilty/Provider(s): [facility], [facility] Reason for Treatment: Depression, SI  Prior Outpatient Therapy Prior Outpatient Therapy: Yes Prior Therapy Dates: Current Prior Therapy Facilty/Provider(s): [facility] Reason for Treatment: Depression, SI Does patient have an ACCT team?: No Does patient have Intensive In-House Services?  : No Does patient have [facility] services? : No Does patient have P4CC services?: No  ADL Screening (condition at time of admission) Patient's cognitive ability adequate to safely complete daily activities?: Yes Patient able to express need for assistance with ADLs?: Yes Independently performs ADLs?: Yes (appropriate for developmental age)       Abuse/Neglect Assessment (Assessment to be complete while patient is alone) Physical Abuse: Denies Verbal Abuse: Denies Sexual Abuse: Denies Exploitation of patient/patient's resources: Denies Self-Neglect: Denies Values / Beliefs Cultural Requests During Hospitalization: None Spiritual Requests During Hospitalization: None Consults Spiritual Care Consult Needed: No Social Work Consult Needed: No Merchant navy officer (For Healthcare) Does patient have an advance directive?: No Would patient like information on creating an advanced directive?: No - patient declined information    Additional Information 1:1 In Past 12 Months?: No CIRT Risk: No Elopement Risk: No Does patient have medical clearance?: Yes     Disposition:  Disposition Initial Assessment Completed for this Encounter: Yes Disposition of Patient: Other dispositions Other disposition(s): Other (Comment) (Discharge back to group home. )  On Site Evaluation by:   Reviewed with Physician:    [place] 06/04/2015 8:47 PM

## 2015-06-04 NOTE — ED Notes (Signed)
SOC monitor at bedside.

## 2015-06-06 ENCOUNTER — Encounter: Payer: Self-pay | Admitting: Emergency Medicine

## 2015-06-06 ENCOUNTER — Emergency Department
Admission: EM | Admit: 2015-06-06 | Discharge: 2015-06-06 | Disposition: A | Discharge status: Home / Self Care | Payer: Medicare Other | Attending: Emergency Medicine | Admitting: Emergency Medicine

## 2015-06-06 DIAGNOSIS — A084 Viral intestinal infection, unspecified: Secondary | ICD-10-CM

## 2015-06-06 DIAGNOSIS — R112 Nausea with vomiting, unspecified: Secondary | ICD-10-CM | POA: Diagnosis present

## 2015-06-06 DIAGNOSIS — Z791 Long term (current) use of non-steroidal anti-inflammatories (NSAID): Secondary | ICD-10-CM | POA: Diagnosis not present

## 2015-06-06 DIAGNOSIS — Z880 Allergy status to penicillin: Secondary | ICD-10-CM | POA: Diagnosis not present

## 2015-06-06 DIAGNOSIS — Z7951 Long term (current) use of inhaled steroids: Secondary | ICD-10-CM | POA: Diagnosis not present

## 2015-06-06 DIAGNOSIS — R45851 Suicidal ideations: Secondary | ICD-10-CM | POA: Diagnosis not present

## 2015-06-06 DIAGNOSIS — Z79899 Other long term (current) drug therapy: Secondary | ICD-10-CM | POA: Insufficient documentation

## 2015-06-06 LAB — URINALYSIS COMPLETE WITH MICROSCOPIC (ARMC ONLY)
BILIRUBIN URINE: UNDETERMINED — AB
GLUCOSE, UA: UNDETERMINED mg/dL — AB
Hgb urine dipstick: UNDETERMINED — AB
Ketones, ur: UNDETERMINED mg/dL — AB
Leukocytes, UA: UNDETERMINED — AB
Nitrite: UNDETERMINED — AB
PH: UNDETERMINED (ref 5.0–8.0)
Protein, ur: UNDETERMINED mg/dL — AB
Specific Gravity, Urine: UNDETERMINED (ref 1.005–1.030)

## 2015-06-06 LAB — COMPREHENSIVE METABOLIC PANEL
ALBUMIN: 3.9 g/dL (ref 3.5–5.0)
ALK PHOS: 51 U/L (ref 38–126)
ALT: 15 U/L (ref 14–54)
ANION GAP: 8 (ref 5–15)
AST: 22 U/L (ref 15–41)
BUN: 22 mg/dL — ABNORMAL HIGH (ref 6–20)
CALCIUM: 9.1 mg/dL (ref 8.9–10.3)
CO2: 24 mmol/L (ref 22–32)
CREATININE: 0.8 mg/dL (ref 0.44–1.00)
Chloride: 108 mmol/L (ref 101–111)
GFR calc Af Amer: >60 mL/min (ref >60)
GFR calc non Af Amer: >60 mL/min (ref >60)
GLUCOSE: 110 mg/dL — AB (ref 65–99)
Potassium: 4.2 mmol/L (ref 3.5–5.1)
SODIUM: 140 mmol/L (ref 135–145)
Total Bilirubin: 0.6 mg/dL (ref 0.3–1.2)
Total Protein: 7.3 g/dL (ref 6.5–8.1)

## 2015-06-06 LAB — CBC
HCT: 41.7 % (ref 35.0–47.0)
HEMOGLOBIN: 13.9 g/dL (ref 12.0–16.0)
MCH: 31 pg (ref 26.0–34.0)
MCHC: 33.3 g/dL (ref 32.0–36.0)
MCV: 93 fL (ref 80.0–100.0)
Platelets: 290 10*3/uL (ref 150–440)
RBC: 4.48 MIL/uL (ref 3.80–5.20)
RDW: 14.2 % (ref 11.5–14.5)
WBC: 9.6 10*3/uL (ref 3.6–11.0)

## 2015-06-06 LAB — ACETAMINOPHEN LEVEL

## 2015-06-06 LAB — SALICYLATE LEVEL

## 2015-06-06 LAB — TROPONIN I: TROPONIN I: 0.03 ng/mL (ref <0.031)

## 2015-06-06 LAB — LIPASE, BLOOD: Lipase: 28 U/L (ref 11–51)

## 2015-06-06 LAB — ETHANOL: Alcohol, Ethyl (B): <5 mg/dL (ref <5)

## 2015-06-06 MED ORDER — ONDANSETRON HCL 4 MG/2ML IJ SOLN
4.0000 mg | Freq: Once | INTRAMUSCULAR | Status: AC | PRN
  Administered 2015-06-06: 4 mg via INTRAVENOUS
  Filled 2015-06-06: qty 2

## 2015-06-06 MED ORDER — SODIUM CHLORIDE 0.9 % IV BOLUS (SEPSIS)
1000.0000 mL | Freq: Once | INTRAVENOUS | Status: AC
  Administered 2015-06-06: 1000 mL via INTRAVENOUS

## 2015-06-06 NOTE — ED Notes (Signed)
Pt now vomiting in triage ?

## 2015-06-06 NOTE — Discharge Instructions (Signed)
Please seek medical attention for any high fevers, chest pain, shortness of breath, change in behavior, persistent vomiting, bloody stool or any other new or concerning symptoms. ° °Viral Gastroenteritis °Viral gastroenteritis is also called stomach flu. This illness is caused by a certain type of germ (virus). It can cause sudden watery poop (diarrhea) and throwing up (vomiting). This can cause you to lose body fluids (dehydration). This illness usually lasts for 3 to 8 days. It usually goes away on its own. °HOME CARE  °· Drink enough fluids to keep your pee (urine) clear or pale yellow. Drink small amounts of fluids often. °· Ask your doctor how to replace body fluid losses (rehydration). °· Avoid: °¨ Foods high in sugar. °¨ Alcohol. °¨ Bubbly (carbonated) drinks. °¨ Tobacco. °¨ Juice. °¨ Caffeine drinks. °¨ Very hot or cold fluids. °¨ Fatty, greasy foods. °¨ Eating too much at one time. °¨ Dairy products until 24 to 48 hours after your watery poop stops. °· You may eat foods with active cultures (probiotics). They can be found in some yogurts and supplements. °· Wash your hands well to avoid spreading the illness. °· Only take medicines as told by your doctor. Do not give aspirin to children. Do not take medicines for watery poop (antidiarrheals). °· Ask your doctor if you should keep taking your regular medicines. °· Keep all doctor visits as told. °GET HELP RIGHT AWAY IF:  °· You cannot keep fluids down. °· You do not pee at least once every 6 to 8 hours. °· You are short of breath. °· You see blood in your poop or throw up. This may look like coffee grounds. °· You have belly (abdominal) pain that gets worse or is just in one small spot (localized). °· You keep throwing up or having watery poop. °· You have a fever. °· The patient is a child younger than 3 months, and he or she has a fever. °· The patient is a child older than 3 months, and he or she has a fever and problems that do not go away. °· The  patient is a child older than 3 months, and he or she has a fever and problems that suddenly get worse. °· The patient is a baby, and he or she has no tears when crying. °MAKE SURE YOU:  °· Understand these instructions. °· Will watch your condition. °· Will get help right away if you are not doing well or get worse. °  °This information is not intended to replace advice given to you by your health care provider. Make sure you discuss any questions you have with your health care provider. °  °Document Released: 09/20/2007 Document Revised: 06/26/2011 Document Reviewed: 01/18/2011 °Elsevier Interactive Patient Education ©2016 Elsevier Inc. ° °

## 2015-06-06 NOTE — ED Notes (Signed)
Pt c/o chest pain, abdominal pain, diarrhea, and being suicidal. All SX started this morning.  Plan for suicide would be to get hit by car or take pills.

## 2015-06-06 NOTE — ED Notes (Signed)
Pt being discharged and is to follow up with her pcp with no changes to medication. Pt given clothes to get changed.

## 2015-06-06 NOTE — ED Provider Notes (Signed)
[facility] Emergency Department Provider Note   ____________________________________________  Time seen: ~1605  I have reviewed the triage vital signs and the nursing notes.   HISTORY  Chief Complaint N/V/abd pain  History limited by: Not Limited   HPI [name] is a 50 y.o. female with multiple visits to this emergency department for chronic suicidal ideation presents to the emergency department today with primary complaint of not feeling well consisting of nausea vomiting diarrhea abdominal pain. She states the symptoms started this morning. She describes the pain as being located in the lower abdomen. It is moderate in severity. It has been constant. She has also had multiple episodes of nonbloody diarrhea and emesis. The patient states that one of the residents at her group home has been sick with similar symptoms recently.  In addition the patient does again today complaining of her chronic suicidal ideation. It is unchanged from my previous examinations of the patient. She has been cleared by psychiatry multiple times.    Past Medical History  Diagnosis Date  . Asthma   . Anxiety   . GERD (gastroesophageal reflux disease)   . Mild cognitive impairment   . Cerebral palsy (HCC)   . Borderline personality disorder   . Depression   . Wound abscess   . Schizoaffective disorder [facility])     Patient Active Problem List   Diagnosis Date Noted  . Anxiety 12/31/2014  . Cellulitis and abscess of leg 12/31/2014  . 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 GERD (gastroesophageal reflux disease) 09/24/2014  . COPD (chronic obstructive pulmonary disease) (HCC) 09/24/2014  . Borderline personality disorder 09/24/2014  . Major depressive disorder, recurrent episode, moderate (HCC) 09/24/2014  . Cerebral palsy (HCC) 08/31/2014    Past Surgical History  Procedure Laterality Date  . Cholecystectomy    . Tonsillectomy    . Incision and drainage  abscess Right 01/02/2015    Procedure: INCISION AND DRAINAGE ABSCESS;  Surgeon: Tiney Rouge III, MD;  Location: [facility] ORS;  Service: General;  Laterality: Right;    Current Outpatient Rx  Name  Route  Sig  Dispense  Refill  . acetaminophen (TYLENOL) 500 MG tablet   Oral   Take 500 mg by mouth every 4 (four) hours as needed for mild pain, moderate pain, fever or headache.         . ARIPiprazole (ABILIFY) 10 MG tablet   Oral   Take 1 tablet (10 mg total) by mouth daily.   30 tablet   0   . azithromycin (ZITHROMAX Z-PAK) 250 MG tablet      Take 2 tablets (500 mg) on  Day 1,  followed by 1 tablet (250 mg) once daily on Days 2 through 5.   6 each   0   . diphenoxylate-atropine (LOMOTIL) 2.5-0.025 MG per tablet   Oral   Take 1 tablet by mouth every 6 (six) hours as needed for diarrhea or loose stools.         Marland Kitchen escitalopram (LEXAPRO) 20 MG tablet   Oral   Take 1 tablet (20 mg total) by mouth at bedtime.   30 tablet   0   . fenofibrate 54 MG tablet   Oral   Take 54 mg by mouth daily.         . Fluticasone-Salmeterol (ADVAIR) 100-50 MCG/DOSE AEPB   Inhalation   Inhale 1 puff into the lungs every 12 (twelve) hours.          Marland Kitchen guaiFENesin-codeine 100-10  MG/5ML syrup   Oral   Take 10 mLs by mouth every 4 (four) hours as needed for cough.   180 mL   0   . ibuprofen (ADVIL,MOTRIN) 800 MG tablet   Oral   Take 1 tablet (800 mg total) by mouth every 8 (eight) hours as needed.   30 tablet   0   . ketoconazole (NIZORAL) 2 % cream   Topical   Apply 1 application topically 2 (two) times daily as needed for irritation. To breast and urinal areas         . meloxicam (MOBIC) 15 MG tablet   Oral   Take 15 mg by mouth daily.         . miconazole (ZEASORB-AF) 2 % powder   Topical   Apply 1 application topically 2 (two) times daily.         . pantoprazole (PROTONIX) 40 MG tablet   Oral   Take 40 mg by mouth daily.         . traMADol (ULTRAM) 50 MG tablet   Oral    Take 50 mg by mouth 2 (two) times daily as needed for moderate pain or severe pain.           Allergies Penicillins  Family History  Problem Relation Age of Onset  . Diabetes Mother   . Heart attack Father     Social History Social History  Substance Use Topics  . Smoking status: Never Smoker   . Smokeless tobacco: Never Used  . Alcohol Use: No    Review of Systems  Constitutional: Negative for fever. Cardiovascular: Negative for chest pain. Respiratory: Negative for shortness of breath. Gastrointestinal: Positive for lower abdominal pain, nausea and vomiting. Neurological: Negative for headaches, focal weakness or numbness.   10-point ROS otherwise negative.  ____________________________________________   PHYSICAL EXAM:  VITAL SIGNS: ED Triage Vitals  Enc Vitals Group     BP 06/06/15 1547 97/66 mmHg     Pulse Rate 06/06/15 1542 90     Resp 06/06/15 1542 18     Temp 06/06/15 1542 98.5 F (36.9 C)     Temp Source 06/06/15 1542 Oral     SpO2 06/06/15 1542 95 %     Weight 06/06/15 1542 207 lb (93.895 kg)     Height 06/06/15 1542 5\' 3"  (1.6 m)     Head Cir --      Peak Flow --      Pain Score 06/06/15 1521 10   Constitutional: Alert and oriented. Well appearing and in no distress. Eyes: Conjunctivae are normal. PERRL. Normal extraocular movements. ENT   Head: Normocephalic and atraumatic.   Nose: No congestion/rhinnorhea.   Mouth/Throat: Mucous membranes are moist.   Neck: No stridor. Hematological/Lymphatic/Immunilogical: No cervical lymphadenopathy. Cardiovascular: Normal rate, regular rhythm.  No murmurs, rubs, or gallops. Respiratory: Normal respiratory effort without tachypnea nor retractions. Breath sounds are clear and equal bilaterally. No wheezes/rales/rhonchi. Gastrointestinal: Soft and minimally tender to palpation diffusely. No rebound. No guarding. No distention. Genitourinary: Deferred Musculoskeletal: Normal range of motion in  all extremities. No joint effusions.  No lower extremity tenderness nor edema. Neurologic:  Normal speech and language. No gross focal neurologic deficits are appreciated.  Skin:  Skin is warm, dry and intact. No rash noted.   ____________________________________________    LABS (pertinent positives/negatives)  Labs Reviewed  COMPREHENSIVE METABOLIC PANEL - Abnormal; Notable for the following:    Glucose, Bld 110 (*)    BUN 22 (*)  All other components within normal limits  URINALYSIS COMPLETEWITH MICROSCOPIC ([facility] ONLY) - Abnormal; Notable for the following:    Bacteria, UA FEW (*)    Squamous Epithelial / LPF 6-30 (*)    All other components within normal limits  ACETAMINOPHEN LEVEL - Abnormal; Notable for the following:    Acetaminophen (Tylenol), Serum <10 (*)    All other components within normal limits  LIPASE, BLOOD  CBC  ETHANOL  SALICYLATE LEVEL  TROPONIN I     ____________________________________________   EKG  None  ____________________________________________    RADIOLOGY  None   ____________________________________________   PROCEDURES  Procedure(s) performed: None  Critical Care performed: No  ____________________________________________   INITIAL IMPRESSION / ASSESSMENT AND PLAN / ED COURSE  Pertinent labs & imaging results that were available during my care of the patient were reviewed by me and considered in my medical decision making (see chart for details).  Patient presented to the emergency department today because of concerns for some nausea and diarrhea and abdominal pain. Physical exam was benign. Patient's blood work and urine without any concerning findings. I think likely patient has gastritis.  In terms of the patient's chronic suicidal ideation. The patient presents today like she has presented multiple times in the past and I evaluated her for this multiple times in the past. Nothing new today think this patient's  chronic as I. She has had multiple psychiatric evaluations recently without any inpatient recommendations.   Will discharge back to group home.  ____________________________________________   FINAL CLINICAL IMPRESSION(S) / ED DIAGNOSES  Final diagnoses:  Viral gastroenteritis     Phineas Semen, MD 06/06/15 [id number]

## 2015-06-07 ENCOUNTER — Encounter: Payer: Self-pay | Admitting: *Deleted

## 2015-06-07 ENCOUNTER — Emergency Department
Admission: EM | Admit: 2015-06-07 | Discharge: 2015-06-07 | Disposition: A | Discharge status: Home / Self Care | Payer: Medicare Other | Attending: Emergency Medicine | Admitting: Emergency Medicine

## 2015-06-07 DIAGNOSIS — Z791 Long term (current) use of non-steroidal anti-inflammatories (NSAID): Secondary | ICD-10-CM | POA: Insufficient documentation

## 2015-06-07 DIAGNOSIS — R1084 Generalized abdominal pain: Secondary | ICD-10-CM

## 2015-06-07 DIAGNOSIS — Z880 Allergy status to penicillin: Secondary | ICD-10-CM | POA: Insufficient documentation

## 2015-06-07 DIAGNOSIS — Z79899 Other long term (current) drug therapy: Secondary | ICD-10-CM | POA: Diagnosis not present

## 2015-06-07 DIAGNOSIS — Z7951 Long term (current) use of inhaled steroids: Secondary | ICD-10-CM | POA: Insufficient documentation

## 2015-06-07 DIAGNOSIS — K529 Noninfective gastroenteritis and colitis, unspecified: Secondary | ICD-10-CM | POA: Insufficient documentation

## 2015-06-07 LAB — URINALYSIS COMPLETE WITH MICROSCOPIC (ARMC ONLY)
BILIRUBIN URINE: NEGATIVE
Bacteria, UA: NONE SEEN
Glucose, UA: NEGATIVE mg/dL
HGB URINE DIPSTICK: NEGATIVE
LEUKOCYTES UA: NEGATIVE
Nitrite: NEGATIVE
PH: 5 (ref 5.0–8.0)
PROTEIN: NEGATIVE mg/dL
RBC / HPF: NONE SEEN RBC/hpf (ref 0–5)
Specific Gravity, Urine: 1.027 (ref 1.005–1.030)

## 2015-06-07 LAB — CBC WITH DIFFERENTIAL/PLATELET
BASOS ABS: 0 10*3/uL (ref 0–0.1)
Basophils Relative: 0 %
EOS PCT: 1 %
Eosinophils Absolute: 0.1 10*3/uL (ref 0–0.7)
HEMATOCRIT: 36.8 % (ref 35.0–47.0)
HEMOGLOBIN: 12.5 g/dL (ref 12.0–16.0)
LYMPHS ABS: 1.2 10*3/uL (ref 1.0–3.6)
LYMPHS PCT: 20 %
MCH: 30.9 pg (ref 26.0–34.0)
MCHC: 33.9 g/dL (ref 32.0–36.0)
MCV: 91 fL (ref 80.0–100.0)
Monocytes Absolute: 0.8 10*3/uL (ref 0.2–0.9)
Monocytes Relative: 13 %
NEUTROS ABS: 3.9 10*3/uL (ref 1.4–6.5)
NEUTROS PCT: 66 %
PLATELETS: 246 10*3/uL (ref 150–440)
RBC: 4.04 MIL/uL (ref 3.80–5.20)
RDW: 14 % (ref 11.5–14.5)
WBC: 6 10*3/uL (ref 3.6–11.0)

## 2015-06-07 LAB — BASIC METABOLIC PANEL
ANION GAP: 10 (ref 5–15)
BUN: 27 mg/dL — AB (ref 6–20)
CHLORIDE: 109 mmol/L (ref 101–111)
CO2: 22 mmol/L (ref 22–32)
Calcium: 8.4 mg/dL — ABNORMAL LOW (ref 8.9–10.3)
Creatinine, Ser: 1.02 mg/dL — ABNORMAL HIGH (ref 0.44–1.00)
GFR calc Af Amer: >60 mL/min (ref >60)
GLUCOSE: 76 mg/dL (ref 65–99)
POTASSIUM: 3.5 mmol/L (ref 3.5–5.1)
Sodium: 141 mmol/L (ref 135–145)

## 2015-06-07 MED ORDER — DICYCLOMINE HCL 20 MG PO TABS: 20.0000 mg | ORAL_TABLET | Freq: Three times a day (TID) | ORAL | Status: DC | PRN

## 2015-06-07 NOTE — ED Notes (Signed)
Pt was seen yesterday in ED for viral gastroenteritis, pt returns from group home [facility]", pt complains of abdominal pain

## 2015-06-07 NOTE — ED Provider Notes (Signed)
Time Seen: Approximately *1410  I have reviewed the triage notes  Chief Complaint: Abdominal Pain   History of Present Illness: [name] is a 50 y.o. female who is a frequent visitor here to emergency department and was actually evaluated yesterday for gastroenteritis. The patient states that her nausea vomiting have improved though now she is having some crampy diffuse abdominal pain. She denies any fever at home. She states she still vomiting however during her stay here in emergency department over several hours and did not have any episode of vomiting. Patient denies any melena or hematochezia. She has long-term psychiatric issues such as borderline personality disorder and perpetually states suicidal ideation which is been evaluated and treated here many times at the hospital. Niacin any suicidal thoughts at this time.   Past Medical History  Diagnosis Date  . Asthma   . Anxiety   . GERD (gastroesophageal reflux disease)   . Mild cognitive impairment   . Cerebral palsy (HCC)   . Borderline personality disorder   . Depression   . Wound abscess   . Schizoaffective disorder [facility])     Patient Active Problem List   Diagnosis Date Noted  . Anxiety 12/31/2014  . Cellulitis and abscess of leg 12/31/2014  . 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 GERD (gastroesophageal reflux disease) 09/24/2014  . COPD (chronic obstructive pulmonary disease) (HCC) 09/24/2014  . Borderline personality disorder 09/24/2014  . Major depressive disorder, recurrent episode, moderate (HCC) 09/24/2014  . Cerebral palsy (HCC) 08/31/2014    Past Surgical History  Procedure Laterality Date  . Cholecystectomy    . Tonsillectomy    . Incision and drainage abscess Right 01/02/2015    Procedure: INCISION AND DRAINAGE ABSCESS;  Surgeon: Tiney Rouge III, MD;  Location: [facility] ORS;  Service: General;  Laterality: Right;    Past Surgical History  Procedure Laterality Date  . Cholecystectomy    .  Tonsillectomy    . Incision and drainage abscess Right 01/02/2015    Procedure: INCISION AND DRAINAGE ABSCESS;  Surgeon: Tiney Rouge III, MD;  Location: [facility] ORS;  Service: General;  Laterality: Right;    Current Outpatient Rx  Name  Route  Sig  Dispense  Refill  . acetaminophen (TYLENOL) 500 MG tablet   Oral   Take 500 mg by mouth every 4 (four) hours as needed for mild pain, moderate pain, fever or headache.         . ARIPiprazole (ABILIFY) 10 MG tablet   Oral   Take 1 tablet (10 mg total) by mouth daily.   30 tablet   0   . azithromycin (ZITHROMAX Z-PAK) 250 MG tablet      Take 2 tablets (500 mg) on  Day 1,  followed by 1 tablet (250 mg) once daily on Days 2 through 5.   6 each   0   . dicyclomine (BENTYL) 20 MG tablet   Oral   Take 1 tablet (20 mg total) by mouth 3 (three) times daily as needed for spasms.   30 tablet   0   . diphenoxylate-atropine (LOMOTIL) 2.5-0.025 MG per tablet   Oral   Take 1 tablet by mouth every 6 (six) hours as needed for diarrhea or loose stools.         Marland Kitchen escitalopram (LEXAPRO) 20 MG tablet   Oral   Take 1 tablet (20 mg total) by mouth at bedtime.   30 tablet   0   . fenofibrate 54 MG tablet  Oral   Take 54 mg by mouth daily.         . Fluticasone-Salmeterol (ADVAIR) 100-50 MCG/DOSE AEPB   Inhalation   Inhale 1 puff into the lungs every 12 (twelve) hours.          Marland Kitchen guaiFENesin-codeine 100-10 MG/5ML syrup   Oral   Take 10 mLs by mouth every 4 (four) hours as needed for cough.   180 mL   0   . ibuprofen (ADVIL,MOTRIN) 800 MG tablet   Oral   Take 1 tablet (800 mg total) by mouth every 8 (eight) hours as needed.   30 tablet   0   . ketoconazole (NIZORAL) 2 % cream   Topical   Apply 1 application topically 2 (two) times daily as needed for irritation. To breast and urinal areas         . meloxicam (MOBIC) 15 MG tablet   Oral   Take 15 mg by mouth daily.         . miconazole (ZEASORB-AF) 2 % powder   Topical    Apply 1 application topically 2 (two) times daily.         . pantoprazole (PROTONIX) 40 MG tablet   Oral   Take 40 mg by mouth daily.         . traMADol (ULTRAM) 50 MG tablet   Oral   Take 50 mg by mouth 2 (two) times daily as needed for moderate pain or severe pain.           Allergies:  Penicillins  Family History: Family History  Problem Relation Age of Onset  . Diabetes Mother   . Heart attack Father     Social History: Social History  Substance Use Topics  . Smoking status: Never Smoker   . Smokeless tobacco: Never Used  . Alcohol Use: No     Review of Systems:   10 point review of systems was performed and was otherwise negative:  Constitutional: No fever Eyes: No visual disturbances ENT: No sore throat, ear pain Cardiac: No chest pain Respiratory: No shortness of breath, wheezing, or stridor Abdomen: Diffuse crampy abdominal pain which occasionally radiates into the lower back. She denies any hematemesis or biliary emesis. She denies any melena or hematochezia. Endocrine: No weight loss, No night sweats Extremities: No peripheral edema, cyanosis Skin: No rashes, easy bruising Neurologic: No focal weakness, trouble with speech or swollowing Urologic: No dysuria, Hematuria, or urinary frequency   Physical Exam:  ED Triage Vitals  Enc Vitals Group     BP 06/07/15 1027 108/73 mmHg     Pulse Rate 06/07/15 1027 94     Resp 06/07/15 1027 15     Temp 06/07/15 1027 99.2 F (37.3 C)     Temp Source 06/07/15 1027 Oral     SpO2 06/07/15 1027 93 %     Weight --      Height --      Head Cir --      Peak Flow --      Pain Score 06/07/15 1344 10     Pain Loc --      Pain Edu? --      Excl. in GC? --     General: Awake , Alert , and Oriented times 3; GCS 15 patient was sleeping when I entered the room Head: Normal cephalic , atraumatic Eyes: Pupils equal , round, reactive to light Nose/Throat: No nasal drainage, patent upper airway without erythema  or  exudate.  Neck: Supple, Full range of motion, No anterior adenopathy or palpable thyroid masses Lungs: Clear to ascultation without wheezes , rhonchi, or rales Heart: Regular rate, regular rhythm without murmurs , gallops , or rubs Abdomen: Mild diffuse tenderness without rebound, guarding , or rigidity; bowel sounds positive and symmetric in all 4 quadrants. No organomegaly .       No focal tenderness over McBurney's point, negative Murphy's sign Extremities: 2 plus symmetric pulses. No edema, clubbing or cyanosis Neurologic: normal ambulation, Motor symmetric without deficits, sensory intact Skin: warm, dry, no rashes   Labs:   All laboratory work was reviewed including any pertinent negatives or positives listed below:  Labs Reviewed  BASIC METABOLIC PANEL - Abnormal; Notable for the following:    BUN 27 (*)    Creatinine, Ser 1.02 (*)    Calcium 8.4 (*)    All other components within normal limits  URINALYSIS COMPLETEWITH MICROSCOPIC ([facility] ONLY) - Abnormal; Notable for the following:    Color, Urine YELLOW (*)    APPearance HAZY (*)    Ketones, ur 1+ (*)    Squamous Epithelial / LPF 0-5 (*)    All other components within normal limits  CBC WITH DIFFERENTIAL/PLATELET   laboratory work was reviewed and appears to be within normal limits   ED Course:  Patient's stay here was overall uneventful and she was observed while we waited her urinalysis to come back. I felt she most likely still had some of the viral gastroenteritis but I felt she did not have any surgical abdomen at this time. Patient was able tolerate fluids here in emergency department she was discharged on Bentyl.    Assessment:  Generalized abdominal pain Gastroenteritis *   Final Clinical Impression:   Final diagnoses:  Generalized abdominal pain     Plan:  Outpatient management Patient was advised to return immediately if condition worsens. Patient was advised to follow up with their primary care  physician or other specialized physicians involved in their outpatient care             Jennye Moccasin, MD 06/07/15 2239

## 2015-06-08 ENCOUNTER — Emergency Department
Admission: EM | Admit: 2015-06-08 | Discharge: 2015-06-10 | Disposition: A | Discharge status: Other Acute Inpatient Hospital | Payer: Medicare Other | Attending: Emergency Medicine | Admitting: Emergency Medicine

## 2015-06-08 ENCOUNTER — Encounter: Payer: Self-pay | Admitting: Emergency Medicine

## 2015-06-08 ENCOUNTER — Encounter: Payer: Medicare Other | Admitting: Internal Medicine

## 2015-06-08 DIAGNOSIS — L97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 with fat layer exposed: Secondary | ICD-10-CM | POA: Diagnosis not present

## 2015-06-08 DIAGNOSIS — F329 Major depressive disorder, single episode, unspecified: Secondary | ICD-10-CM | POA: Diagnosis not present

## 2015-06-08 DIAGNOSIS — F603 Borderline personality disorder: Secondary | ICD-10-CM | POA: Diagnosis present

## 2015-06-08 DIAGNOSIS — F70 Mild intellectual disabilities: Secondary | ICD-10-CM | POA: Diagnosis present

## 2015-06-08 DIAGNOSIS — Z791 Long term (current) use of non-steroidal anti-inflammatories (NSAID): Secondary | ICD-10-CM | POA: Diagnosis not present

## 2015-06-08 DIAGNOSIS — F32A Depression, unspecified: Secondary | ICD-10-CM

## 2015-06-08 DIAGNOSIS — Z792 Long term (current) use of antibiotics: Secondary | ICD-10-CM | POA: Diagnosis not present

## 2015-06-08 DIAGNOSIS — Z880 Allergy status to penicillin: Secondary | ICD-10-CM | POA: Diagnosis not present

## 2015-06-08 DIAGNOSIS — G809 Cerebral palsy, unspecified: Secondary | ICD-10-CM | POA: Diagnosis present

## 2015-06-08 DIAGNOSIS — R45851 Suicidal ideations: Secondary | ICD-10-CM | POA: Diagnosis present

## 2015-06-08 DIAGNOSIS — Z79899 Other long term (current) drug therapy: Secondary | ICD-10-CM | POA: Insufficient documentation

## 2015-06-08 DIAGNOSIS — A09 Infectious gastroenteritis and colitis, unspecified: Secondary | ICD-10-CM | POA: Insufficient documentation

## 2015-06-08 DIAGNOSIS — J449 Chronic obstructive pulmonary disease, unspecified: Secondary | ICD-10-CM | POA: Diagnosis present

## 2015-06-08 DIAGNOSIS — F489 Nonpsychotic mental disorder, unspecified: Secondary | ICD-10-CM | POA: Diagnosis not present

## 2015-06-08 LAB — COMPREHENSIVE METABOLIC PANEL
ALK PHOS: 51 U/L (ref 38–126)
ALT: 14 U/L (ref 14–54)
ANION GAP: 10 (ref 5–15)
AST: 22 U/L (ref 15–41)
Albumin: 3.8 g/dL (ref 3.5–5.0)
BILIRUBIN TOTAL: 0.2 mg/dL — AB (ref 0.3–1.2)
BUN: 27 mg/dL — ABNORMAL HIGH (ref 6–20)
CALCIUM: 8.7 mg/dL — AB (ref 8.9–10.3)
CO2: 29 mmol/L (ref 22–32)
Chloride: 102 mmol/L (ref 101–111)
Creatinine, Ser: 0.87 mg/dL (ref 0.44–1.00)
GFR calc Af Amer: >60 mL/min (ref >60)
Glucose, Bld: 104 mg/dL — ABNORMAL HIGH (ref 65–99)
POTASSIUM: 3.1 mmol/L — AB (ref 3.5–5.1)
Sodium: 141 mmol/L (ref 135–145)
TOTAL PROTEIN: 7.3 g/dL (ref 6.5–8.1)

## 2015-06-08 LAB — CBC
HEMATOCRIT: 37.2 % (ref 35.0–47.0)
HEMOGLOBIN: 12.6 g/dL (ref 12.0–16.0)
MCH: 31.1 pg (ref 26.0–34.0)
MCHC: 33.8 g/dL (ref 32.0–36.0)
MCV: 91.8 fL (ref 80.0–100.0)
Platelets: 273 10*3/uL (ref 150–440)
RBC: 4.05 MIL/uL (ref 3.80–5.20)
RDW: 14.3 % (ref 11.5–14.5)
WBC: 9.8 10*3/uL (ref 3.6–11.0)

## 2015-06-08 LAB — ETHANOL

## 2015-06-08 MED ORDER — POTASSIUM CHLORIDE CRYS ER 20 MEQ PO TBCR
40.0000 meq | EXTENDED_RELEASE_TABLET | Freq: Once | ORAL | Status: AC
  Administered 2015-06-08: 40 meq via ORAL
  Filled 2015-06-08: qty 2

## 2015-06-08 MED ORDER — RISPERIDONE 1 MG PO TABS
1.0000 mg | ORAL_TABLET | Freq: Two times a day (BID) | ORAL | Status: DC
  Administered 2015-06-08 – 2015-06-09 (×2): 1 mg via ORAL
  Filled 2015-06-08 (×2): qty 1

## 2015-06-08 MED ORDER — HYDROXYZINE HCL 25 MG PO TABS
50.0000 mg | ORAL_TABLET | Freq: Every day | ORAL | Status: DC
  Administered 2015-06-08: 50 mg via ORAL
  Filled 2015-06-08 (×2): qty 2

## 2015-06-08 MED ORDER — QUETIAPINE FUMARATE 25 MG PO TABS
25.0000 mg | ORAL_TABLET | Freq: Every day | ORAL | Status: DC
  Administered 2015-06-08: 25 mg via ORAL
  Filled 2015-06-08: qty 1

## 2015-06-08 MED ORDER — DIVALPROEX SODIUM ER 250 MG PO TB24
250.0000 mg | ORAL_TABLET | Freq: Every day | ORAL | Status: DC
  Administered 2015-06-08 – 2015-06-09 (×2): 250 mg via ORAL
  Filled 2015-06-08 (×2): qty 1

## 2015-06-08 NOTE — ED Notes (Signed)
Pt. To ED-[facility] from ED ambulatory without difficulty, to room #3 . Report from RN. Pt. Is alert and oriented, warm and dry in no distress. Pt. Verbalizes having SI; denies having HI, and AVH. Pt. Calm and cooperative. Pt. Made aware of security cameras and Q15 minute rounds. Pt. Encouraged to let Nursing staff know of any concerns or needs.  

## 2015-06-08 NOTE — ED Notes (Signed)
BEHAVIORAL HEALTH ROUNDING Patient sleeping: No. Patient alert and oriented: yes Behavior appropriate: Yes.  ; If no, describe:  Nutrition and fluids offered: yes Toileting and hygiene offered: Yes  Sitter present: q15 minute observations and security  monitoring Law enforcement present: Yes  ODS  

## 2015-06-08 NOTE — ED Notes (Signed)
Pt reports "I am not happy living where I am living - I want a new place - I have a care coordinator from [place] and they are taking too long finding me somewhere to live."
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
## 2015-06-08 NOTE — ED Notes (Signed)

## 2015-06-08 NOTE — ED Notes (Signed)
Pt. Noted in  room watching the tv.;. No complaints or concerns voiced. No distress or abnormal behavior noted. Will continue to monitor with security cameras. Q 15 minute rounds continue. 

## 2015-06-08 NOTE — ED Notes (Signed)
Report received from Billey Chang., RN. Pt. Alert and oriented in no distress; verbalizes having SI; denies having HI, AVH and  pain.  Pt. Instructed to come to me with problems or concerns; fluids have been offered;.Will continue to monitor for safety via security cameras and Q 15 minute checks.
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
## 2015-06-08 NOTE — ED Notes (Signed)
Supper provided along with an extra drink  Pt observed with no unusual behavior  Appropriate to stimulation  No verbalized needs or concerns at this time  NAD assessed  Continue to monitor 

## 2015-06-09 DIAGNOSIS — F329 Major depressive disorder, single episode, unspecified: Secondary | ICD-10-CM | POA: Diagnosis not present

## 2015-06-09 DIAGNOSIS — F489 Nonpsychotic mental disorder, unspecified: Secondary | ICD-10-CM | POA: Diagnosis not present

## 2015-06-09 MED ORDER — ASPIRIN 81 MG PO CHEW
CHEWABLE_TABLET | ORAL | Status: AC
Start: 2015-06-09 — End: 2015-06-09
  Administered 2015-06-09: 324 mg via ORAL
  Filled 2015-06-09: qty 4

## 2015-06-09 MED ORDER — DIVALPROEX SODIUM ER 500 MG PO TB24
500.0000 mg | ORAL_TABLET | Freq: Two times a day (BID) | ORAL | Status: DC
  Administered 2015-06-09 – 2015-06-10 (×2): 500 mg via ORAL
  Filled 2015-06-09 (×2): qty 1

## 2015-06-09 MED ORDER — LORAZEPAM 1 MG PO TABS
ORAL_TABLET | ORAL | Status: AC
  Administered 2015-06-09: 1 mg via ORAL
  Filled 2015-06-09: qty 1

## 2015-06-09 MED ORDER — MOMETASONE FURO-FORMOTEROL FUM 100-5 MCG/ACT IN AERO
2.0000 | INHALATION_SPRAY | Freq: Two times a day (BID) | RESPIRATORY_TRACT | Status: DC
  Administered 2015-06-09 – 2015-06-10 (×3): 2 via RESPIRATORY_TRACT
  Filled 2015-06-09 (×2): qty 8.8

## 2015-06-09 MED ORDER — MELOXICAM 7.5 MG PO TABS
15.0000 mg | ORAL_TABLET | Freq: Every day | ORAL | Status: DC
  Administered 2015-06-09 – 2015-06-10 (×2): 15 mg via ORAL
  Filled 2015-06-09 (×2): qty 2
  Filled 2015-06-09: qty 1

## 2015-06-09 MED ORDER — POTASSIUM CHLORIDE CRYS ER 20 MEQ PO TBCR
40.0000 meq | EXTENDED_RELEASE_TABLET | Freq: Once | ORAL | Status: AC
  Administered 2015-06-09: 40 meq via ORAL

## 2015-06-09 MED ORDER — LORAZEPAM 1 MG PO TABS
1.0000 mg | ORAL_TABLET | Freq: Two times a day (BID) | ORAL | Status: DC
  Administered 2015-06-09 – 2015-06-10 (×3): 1 mg via ORAL
  Filled 2015-06-09: qty 1

## 2015-06-09 MED ORDER — ESCITALOPRAM OXALATE 10 MG PO TABS
ORAL_TABLET | ORAL | Status: AC
  Administered 2015-06-09: 20 mg via ORAL
  Filled 2015-06-09: qty 2

## 2015-06-09 MED ORDER — ESCITALOPRAM OXALATE 10 MG PO TABS
20.0000 mg | ORAL_TABLET | Freq: Every day | ORAL | Status: DC
  Administered 2015-06-09 – 2015-06-10 (×2): 20 mg via ORAL
  Filled 2015-06-09: qty 2

## 2015-06-09 MED ORDER — RISPERIDONE 1 MG PO TABS
ORAL_TABLET | ORAL | Status: AC
  Administered 2015-06-09: 1 mg via ORAL
  Filled 2015-06-09: qty 1

## 2015-06-09 MED ORDER — POTASSIUM CHLORIDE CRYS ER 20 MEQ PO TBCR
EXTENDED_RELEASE_TABLET | ORAL | Status: AC
  Filled 2015-06-09: qty 2

## 2015-06-09 MED ORDER — ASPIRIN EC 81 MG PO TBEC
DELAYED_RELEASE_TABLET | ORAL | Status: AC
  Administered 2015-06-09: 81 mg via ORAL
  Filled 2015-06-09: qty 1

## 2015-06-09 MED ORDER — RISPERIDONE 1 MG PO TABS
1.0000 mg | ORAL_TABLET | Freq: Two times a day (BID) | ORAL | Status: DC
  Administered 2015-06-09 – 2015-06-10 (×2): 1 mg via ORAL
  Filled 2015-06-09: qty 1

## 2015-06-09 MED ORDER — FENOFIBRATE 54 MG PO TABS
54.0000 mg | ORAL_TABLET | Freq: Every day | ORAL | Status: DC
  Administered 2015-06-09: 54 mg via ORAL
  Filled 2015-06-09 (×5): qty 1

## 2015-06-09 MED ORDER — LOPERAMIDE HCL 2 MG PO CAPS
ORAL_CAPSULE | ORAL | Status: AC
  Administered 2015-06-09: 2 mg via ORAL
  Filled 2015-06-09: qty 1

## 2015-06-09 MED ORDER — LOPERAMIDE HCL 2 MG PO CAPS
2.0000 mg | ORAL_CAPSULE | Freq: Once | ORAL | Status: AC
  Administered 2015-06-09: 2 mg via ORAL
  Filled 2015-06-09: qty 1

## 2015-06-09 MED ORDER — ASPIRIN 81 MG PO CHEW
324.0000 mg | CHEWABLE_TABLET | Freq: Once | ORAL | Status: AC
  Administered 2015-06-09: 324 mg via ORAL
  Filled 2015-06-09: qty 4

## 2015-06-09 MED ORDER — LOPERAMIDE HCL 2 MG PO CAPS
2.0000 mg | ORAL_CAPSULE | Freq: Two times a day (BID) | ORAL | Status: DC | PRN
  Filled 2015-06-09: qty 1

## 2015-06-09 MED ORDER — ASPIRIN EC 81 MG PO TBEC
81.0000 mg | DELAYED_RELEASE_TABLET | Freq: Every day | ORAL | Status: DC
  Administered 2015-06-09 – 2015-06-10 (×2): 81 mg via ORAL
  Filled 2015-06-09: qty 1

## 2015-06-09 MED ORDER — ASPIRIN EC 325 MG PO TBEC
DELAYED_RELEASE_TABLET | ORAL | Status: AC
  Filled 2015-06-09: qty 1
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
## 2015-06-09 NOTE — ED Notes (Signed)
Nurse noticed that when patient sat on chair in dayroom that there was some liquid left behind on the seat of the chair. Patient then stated that she had an episode of diarrhea earlier in the day and that after that she has had small episodes of bowel and urine incontinence. Nurse offered patient a diaper to wear and patient accepted stating " I really need one of these". Nurse instructed patient to alert nurse of next episode of diarrhea so that it can be assessed. Will continue to monitor for safety.

## 2015-06-09 NOTE — Progress Notes (Signed)
REVECA, DESMARAIS ([id number]) Visit Report for 06/08/2015 Chief Complaint Document Details Patient Name: [name], [name] Date of Service: 06/08/2015 10:45 AM Medical Record Patient Account Number: [id number] Number: Treating RN: Clover Mealy, RN, BSN, [place] Sink Date of Birth/Sex: 1966-01-08 (50 y.o. Female) Other Clinician: Primary Care MCLEAN-SCOCOZZA, Treating Rheba Diamond Physician: French Ana Physician/Extender: Fabiola Backer, Referring Physician: Delmer Islam in Treatment: 69 Information Obtained from: Patient Chief Complaint Right calf ulcer, status post I and D of abscess. Electronic Signature(s) Signed: 06/09/2015 8:05:15 AM By: Baltazar Najjar MD Entered By: Baltazar Najjar on 06/08/2015 12:33:48 [name] ([id number]) -------------------------------------------------------------------------------- HPI Details Patient Name: [name] Date of Service: 06/08/2015 10:45 AM Medical Record Patient Account Number: [id number] Number: Treating RN: Clover Mealy RN, BSN, [place] Sink Date of Birth/Sex: [birth year] (50 y.o. Female) Other Clinician: Primary Care MCLEAN-SCOCOZZA, Treating Brittni Hult Physician: French Ana Physician/Extender: Fabiola Backer, Referring Physician: Delmer Islam in Treatment: 85 History of Present Illness HPI Description: Pleasant 50 year old with history of borderline personality disorder and CP. No history of diabetes or peripheral arterial disease. Right ABI 1.2. She developed an ulceration on her right anterior calf secondary to an insect bite. This progressed to an abscess, and she underwent I and D on 01/02/2015 by Dr. Michela Pitcher. Cultures grew oxacillin sensitive staph aureus. She completed a course of clindamycin. She lives in a group home. Punch biopsy 03/24/2015 showed no evidence for malignancy. Negative for granulomatous inflammation and vasculitis. Tolerating 3 layer compression bandage with silver alginate 3 times weekly. Epifix  #1 applied 04/28/2015. Showing significant improvement recently. She returns to clinic for follow-up and is without new complaints. No significant pain. No fever or chills. Minimal drainage. 05/12/2015 -- here for the third application of a Epifix today. 05/19/15; this is a 50 year old woman who lives in an assisted living. She apparently had a severe episode of cellulitis of the right leg in September. Since then she had a wound on her right anterior leg. Punch biopsy in early December was negative for malignancy, vasculitis. She had her third Epifix place last week. His appears to be doing well. Per the staff no further epifixe planned. The nurse from her facility was with her says there is been tremendous improvement in the condition of this wound, I understand that she was in hospital this week for psychiatric issues nothing to do with the wound 06/02/15 continued improvement in the condition of this wound. She has only a small open area remaining. I have reviewed the history this was felt to be secondary to an insect bite and secondary abscess. Cultures of this grew methicillin sensitive staph aureus. She completed a course of antibiotics. She was treated with a third application of Epifix ending in late January. 06/08/15 the wound has continued to close over and is fully epithelialized. Although this was advertised as an insect bite, her attendant from where she lives seem to suggest that this was from repetitive scratching. Either way it became secondarily infected. She does not have a wound history she has no signs of arterial or venous insufficiency Electronic Signature(s) Signed: 06/09/2015 8:05:15 AM By: Baltazar Najjar MD Entered By: Baltazar Najjar on 06/08/2015 12:35:43 [name] ([id number]) -------------------------------------------------------------------------------- Physical Exam Details Patient Name: [name] Date of Service: 06/08/2015 10:45 AM Medical  Record Patient Account Number: [id number] Number: Treating RN: Clover Mealy RN, BSN, [place] Sink Date of Birth/Sex: 1965-11-05 (50 y.o. Female) Other Clinician: Primary Care MCLEAN-SCOCOZZA, Treating Maliek Schellhorn Physician: French Ana Physician/Extender: Fabiola Backer, Referring Physician: Delmer Islam in Treatment: 19 Notes Wound exam. The area is totally  epithelialized and we have declared it healed. No evidence of surrounding infection or ischemia. Peripheral pulses are palpable. There is no evidence of arterial or venous insufficiency Electronic Signature(s) Signed: 06/09/2015 8:05:15 AM By: Baltazar Najjar MD Entered By: Baltazar Najjar on 06/08/2015 12:36:27 [name] ([id number]) -------------------------------------------------------------------------------- Physician Orders Details Patient Name: [name] Date of Service: 06/08/2015 10:45 AM Medical Record Patient Account Number: [id number] Number: Treating RN: Clover Mealy RN, BSN, [place] Sink Date of Birth/Sex: 04-18-1965 (50 y.o. Female) Other Clinician: Primary Care MCLEAN-SCOCOZZA, Treating Tallyn Holroyd Physician: French Ana Physician/Extender: Fabiola Backer, Referring Physician: Delmer Islam in Treatment: 34 Verbal / Phone Orders: Yes Clinician: Afful, RN, BSN, Rita Read Back and Verified: Yes Diagnosis Coding Discharge From [facility] o Discharge from Wound Care Center - Treatment Completed. Electronic Signature(s) Signed: 06/08/2015 11:15:43 AM By: Elpidio Eric BSN, RN Signed: 06/09/2015 8:05:15 AM By: Baltazar Najjar MD Entered By: Elpidio Eric on 06/08/2015 11:15:42 [name] ([id number]) -------------------------------------------------------------------------------- Problem List Details Patient Name: [name] Date of Service: 06/08/2015 10:45 AM Medical Record Patient Account Number: [id number] Number: Treating RN: Clover Mealy RN, BSN, [place] Sink Date of Birth/Sex:  27-Jun-1965 (50 y.o. 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 S81.801S Unspecified open wound, right lower leg, sequela 01/20/2015 Yes F60.3 Borderline personality disorder 01/20/2015 Yes Inactive Problems Resolved Problems ICD-10 Code Description Active Date Resolved Date L03.115 Cellulitis of right lower limb 04/07/2015 04/07/2015 Electronic Signature(s) Signed: 06/09/2015 8:05:15 AM By: Baltazar Najjar MD Entered By: Baltazar Najjar on 06/08/2015 12:33:33 [name] ([id number]) -------------------------------------------------------------------------------- Progress Note Details Patient Name: [name] Date of Service: 06/08/2015 10:45 AM Medical Record Patient Account Number: [id number] Number: Treating RN: Clover Mealy RN, BSN,  Sink Date of Birth/Sex: December 09, 1965 (50 y.o. Female) Other Clinician: Primary Care MCLEAN-SCOCOZZA, Treating Emeril Stille Physician: French Ana Physician/Extender: Fabiola Backer, Referring Physician: Delmer Islam in Treatment: 22 Subjective Chief Complaint Information obtained from Patient Right calf ulcer, status post I and D of abscess. History of Present Illness (HPI) Pleasant 50 year old with history of borderline personality disorder and CP. No history of diabetes or peripheral arterial disease. Right ABI 1.2. She developed an ulceration on her right anterior calf secondary to an insect bite. This progressed to an abscess, and she underwent I and D on 01/02/2015 by Dr. Michela Pitcher. Cultures grew oxacillin sensitive staph aureus. She completed a course of clindamycin. She lives in a group home. Punch biopsy 03/24/2015 showed no  evidence for malignancy. Negative for granulomatous inflammation and vasculitis. Tolerating 3 layer compression bandage with silver alginate 3 times weekly. Epifix #1 applied 04/28/2015. Showing significant improvement recently. She returns to clinic for follow-up and is without new complaints. No significant pain. No fever or chills. Minimal drainage. 05/12/2015 -- here for the third application of a Epifix today. 05/19/15; this is a 50 year old woman who lives in an assisted living. She apparently had a severe episode of cellulitis of the right leg in September. Since then she had a wound on her right anterior leg. Punch biopsy in early December was negative for malignancy, vasculitis. She had her third Epifix place last week. His appears to be doing well. Per the staff no further epifixe planned. The nurse from her facility was with her says there is been tremendous improvement in the condition of this wound, I understand that she was in hospital  this week for psychiatric issues nothing to do with the wound 06/02/15 continued improvement in the condition of this wound. She has only a small open area remaining. I have reviewed the history this was felt to be secondary to an insect bite and secondary abscess. Cultures of this grew methicillin sensitive staph aureus. She completed a course of antibiotics. She was treated with a third application of Epifix ending in late January. 06/08/15 the wound has continued to close over and is fully epithelialized. Although this was advertised as an insect bite, her attendant from where she lives seem to suggest that this was from repetitive scratching. Either way it became secondarily infected. She does not have a wound history she has no signs of arterial or venous insufficiency [name], [name] ([id number]) Objective Constitutional Vitals Time Taken: 10:56 AM, Height: 64 in, Weight: 215 lbs, BMI: 36.9, Temperature: 98.2 F, Pulse: 88 bpm, Respiratory  Rate: 18 breaths/min, Blood Pressure: 113/74 mmHg. Integumentary (Hair, Skin) Wound #1 status is Healed - Epithelialized. Original cause of wound was Bite. The wound is located on the Right,Medial Lower Leg. The wound measures 0cm length x 0cm width x 0cm depth; 0cm^2 area and 0cm^3 volume. The wound is limited to skin breakdown. There is no tunneling or undermining noted. There is a none present amount of drainage noted. The wound margin is distinct with the outline attached to the wound base. There is no granulation within the wound bed. There is no necrotic tissue within the wound bed. The periwound skin appearance exhibited: Dry/Scaly. The periwound skin appearance did not exhibit: Callus, Crepitus, Excoriation, Fluctuance, Friable, Induration, Localized Edema, Rash, Scarring, Maceration, Moist, Atrophie Blanche, Cyanosis, Ecchymosis, Hemosiderin Staining, Mottled, Pallor, Rubor, Erythema. Assessment Active Problems ICD-10 L97.212 - Non-pressure chronic ulcer of right calf with fat layer exposed S81.801S - Unspecified open wound, right lower leg, sequela F60.3 - Borderline personality disorder Plan Discharge From [facility] Services: Discharge from Wound Care Center - Treatment Completed. [name], [name] ([id number]) the area is resolved. No need for further secondary prevention that I could see. She can be discharged Electronic Signature(s) Signed: 06/09/2015 8:05:15 AM By: Baltazar Najjar MD Entered By: Baltazar Najjar on 06/08/2015 12:37:25 [name] ([id number]) -------------------------------------------------------------------------------- SuperBill Details Patient Name: [name] Date of Service: 06/08/2015 Medical Record Patient Account Number: [id number] Number: Treating RN: Clover Mealy RN, BSN, [place] Sink Date of Birth/Sex: Oct 31, 1965 (50 y.o. Female) Other Clinician: Primary Care MCLEAN-SCOCOZZA, Treating Paolina Karwowski Physician: French Ana Physician/Extender:  Fabiola Backer, Weeks in Treatment: 19 Referring Physician: TRACY Diagnosis Coding ICD-10 Codes Code Description [phone] Non-pressure chronic ulcer of right calf with fat layer exposed S81.801S Unspecified open wound, right lower leg, sequela F60.3 Borderline personality disorder Facility Procedures CPT4 Code: [id number] Description: 84132 - WOUND CARE VISIT-LEV 2 EST PT Modifier: Quantity: 1 Electronic Signature(s) Signed: 06/09/2015 8:05:15 AM By: Baltazar Najjar MD Entered By: Baltazar Najjar on 06/08/2015 12:37:44

## 2015-06-09 NOTE — ED Notes (Signed)
ENVIRONMENTAL ASSESSMENT Potentially harmful objects out of patient reach: Yes Personal belongings secured: Yes Patient dressed in hospital provided attire only: Yes Plastic bags out of patient reach: Yes Patient care equipment (cords, cables, call bells, lines, and drains) shortened, removed, or accounted for: Yes Equipment and supplies removed from bottom of stretcher: Yes Potentially toxic materials out of patient reach: Yes Sharps container removed or out of patient reach: Yes   Patient is currently sleeping in room. No signs of distress noted. Maintained on 15 minute checks and observation by security camera for safety.

## 2015-06-09 NOTE — ED Provider Notes (Signed)
-----------------------------------------   7:05 AM on 06/09/2015 -----------------------------------------   Blood pressure 152/11, pulse 102, temperature 98.3 F (36.8 C), temperature source Oral, resp. rate 18, height  (1.6 m), weight 207 lb 14.4 oz (94.303 kg), SpO2 96 %.  The patient had no acute events since last update.  Calm and cooperative at this time.  Disposition is pending per Psychiatry/Behavioral Medicine team recommendations.     Irean Hong, MD 06/09/15 [phone]
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
## 2015-06-09 NOTE — ED Notes (Signed)
Report received from Karena T., RN. Pt. Alert and oriented in no distress verbalizes having  SI; denies having HI, AVH and pain.  Pt. Instructed to come to me with problems or concerns.Will continue to monitor for safety via security cameras and Q 15 minute checks. 

## 2015-06-09 NOTE — ED Notes (Signed)
Patient repeatedly comes to nursing station to ask to speak to Child psychotherapist. Nurse stated that she would let the social worker know that the patient wanted to speak with her. At the moment no signs of distress noted. Maintained on 15 minute checks and observation by security camera for safety.

## 2015-06-09 NOTE — ED Notes (Signed)
Patient is currently in room resting.  No signs of distress noted. Maintained on 15 minute checks and observation by security camera for safety.

## 2015-06-09 NOTE — ED Notes (Signed)
Patient currently on the phone. No signs of distress noted. Maintained on 15 minute checks and observation by security camera for safety.

## 2015-06-09 NOTE — ED Notes (Signed)
Patient continually asks whether or not she will go downstairs to the inpatient unit. Nurse informed patient that the decision for admission on the unit was up to the discretion of the psychiatrist. Patient agreed to wait for psychiatrist decision. Will continue to monitor for safety and signs of distress.

## 2015-06-09 NOTE — ED Notes (Signed)
Patient is currently in room resting. No signs of distress noted. Maintained on 15 minute checks and observation by security camera for safety.  

## 2015-06-09 NOTE — Progress Notes (Signed)
[name], [name] ([id number]) Visit Report for 06/08/2015 Arrival Information Details Patient Name: [name], [name] Date of Service: 06/08/2015 10:45 AM Medical Record Patient Account Number: [id number] Number: Treating RN: Clover Mealy, RN, BSN, [place] [facility] Date of Birth/Sex: Jan 28, 1966 (50 y.o. Female) Other Clinician: Primary Care MCLEAN-SCOCOZZA, Treating ROBSON, MICHAEL Physician: French Ana Physician/Extender: Fabiola Backer, Referring Physician: Delmer Islam in Treatment: 19 Visit Information History Since Last Visit Added or deleted any medications: No Patient Arrived: Ambulatory Any new allergies or adverse reactions: No Arrival Time: 10:54 Had a fall or experienced change in No Accompanied By: caregiver activities of daily living that may affect Transfer Assistance: None risk of falls: Patient Identification Verified: Yes Signs or symptoms of abuse/neglect since last No Secondary Verification Process Yes visito Completed: Hospitalized since last visit: No Patient Requires Transmission-Based No Has Dressing in Place as Prescribed: Yes Precautions: Has Compression in Place as Prescribed: Yes Patient Has Alerts: Yes Pain Present Now: No Patient Alerts: aspirin Electronic Signature(s) Signed: 06/08/2015 5:12:37 PM By: Elpidio Eric BSN, RN Entered By: Elpidio Eric on 06/08/2015 10:55:24 [name] ([id number]) -------------------------------------------------------------------------------- Clinic Level of Care Assessment Details Patient Name: [name] Date of Service: 06/08/2015 10:45 AM Medical Record Patient Account Number: [id number] Number: Treating RN: Clover Mealy, RN, BSN, [place] Sink Date of Birth/Sex: 08-21-1965 (50 y.o. Female) Other Clinician: Primary Care MCLEAN-SCOCOZZA, Treating ROBSON, MICHAEL Physician: French Ana Physician/Extender: Fabiola Backer, Referring Physician: Delmer Islam in Treatment: 19 Clinic Level of Care Assessment  Items TOOL 4 Quantity Score []  - Use when only an EandM is performed on FOLLOW-UP visit 0 ASSESSMENTS - Nursing Assessment / Reassessment X - Reassessment of Co-morbidities (includes updates in patient status) 1 10 X - Reassessment of Adherence to Treatment Plan 1 5 ASSESSMENTS - Wound and Skin Assessment / Reassessment []  - Simple Wound Assessment / Reassessment - one wound 0 []  - Complex Wound Assessment / Reassessment - multiple wounds 0 []  - Dermatologic / Skin Assessment (not related to wound area) 0 ASSESSMENTS - Focused Assessment []  - Circumferential Edema Measurements - multi extremities 0 []  - Nutritional Assessment / Counseling / Intervention 0 []  - Lower Extremity Assessment (monofilament, tuning fork, pulses) 0 []  - Peripheral Arterial Disease Assessment (using hand held doppler) 0 ASSESSMENTS - Ostomy and/or Continence Assessment and Care []  - Incontinence Assessment and Management 0 []  - Ostomy Care Assessment and Management (repouching, etc.) 0 PROCESS - Coordination of Care X - Simple Patient / Family Education for ongoing care 1 15 []  - Complex (extensive) Patient / Family Education for ongoing care 0 []  - Staff obtains Consents, Records, Test Results / Process Orders 0 [name], [name] ([id number]) []  - Staff telephones HHA, Nursing Homes / Clarify orders / etc 0 []  - Routine Transfer to another Facility (non-emergent condition) 0 []  - Routine Hospital Admission (non-emergent condition) 0 []  - New Admissions / Manufacturing engineer / Ordering NPWT, Apligraf, etc. 0 []  - Emergency Hospital Admission (emergent condition) 0 []  - Simple Discharge Coordination 0 []  - Complex (extensive) Discharge Coordination 0 PROCESS - Special Needs []  - Pediatric / Minor Patient Management 0 []  - Isolation Patient Management 0 []  - Hearing / Language / Visual special needs 0 []  - Assessment of Community assistance (transportation, D/C planning, etc.) 0 []  - Additional assistance  / Altered mentation 0 []  - Support Surface(s) Assessment (bed, cushion, seat, etc.) 0 INTERVENTIONS - Wound Cleansing / Measurement []  - Simple Wound Cleansing - one wound 0 []  - Complex Wound Cleansing - multiple wounds 0 X - Wound Imaging (photographs -  any number of wounds) 1 5  - Wound Tracing (instead of photographs) 0  - Simple Wound Measurement - one wound 0  - Complex Wound Measurement - multiple wounds 0 INTERVENTIONS - Wound Dressings  - Small Wound Dressing one or multiple wounds 0  - Medium Wound Dressing one or multiple wounds 0  - Large Wound Dressing one or multiple wounds 0  - Application of Medications - topical 0  - Application of Medications - injection 0 [name], [name] ([id number]) INTERVENTIONS - Miscellaneous  - External ear exam 0  - Specimen Collection (cultures, biopsies, blood, body fluids, etc.) 0  - Specimen(s) / Culture(s) sent or taken to Lab for analysis 0  - Patient Transfer (multiple staff / Michiel Sites Lift / Similar devices) 0  - Simple Staple / Suture removal (25 or less) 0  - Complex Staple / Suture removal (26 or more) 0  - Hypo / Hyperglycemic Management (close monitor of Blood Glucose) 0  - Ankle / Brachial Index (ABI) - do not check if billed separately 0 X - Vital Signs 1 5 Has the patient been seen at the hospital within the last three years: Yes Total Score: 40 Level Of Care: New/Established - Level 2 Electronic Signature(s) Signed: 06/08/2015 11:30:10 AM By: Elpidio Eric BSN, RN Entered By: Elpidio Eric on 06/08/2015 11:30:10 [name] ([id number]) -------------------------------------------------------------------------------- Encounter Discharge Information Details Patient Name: [name] Date of Service: 06/08/2015 10:45 AM Medical Record Patient Account Number: [id number] Number: Treating RN: Clover Mealy RN, BSN, [place] Sink Date of Birth/Sex: Dec 18, 1965 (50 y.o. Female) Other  Clinician: Primary Care MCLEAN-SCOCOZZA, Treating ROBSON, MICHAEL Physician: French Ana Physician/Extender: Fabiola Backer, Referring Physician: Delmer Islam in Treatment: 84 Encounter Discharge Information Items Discharge Pain Level: 0 Discharge Condition: Stable Ambulatory Status: Ambulatory Discharge Destination: Home Transportation: Other Accompanied By: caregiver Schedule Follow-up Appointment: No Medication Reconciliation completed and provided to Patient/Care No [name]: Provided on Clinical Summary of Care: 06/08/2015 Form Type Recipient Paper Patient [place] Electronic Signature(s) Signed: 06/08/2015 5:12:37 PM By: Elpidio Eric BSN, RN Previous Signature: 06/08/2015 11:17:18 AM Version By: Francie Massing Entered By: Elpidio Eric on 06/08/2015 11:30:56 [name] ([id number]) -------------------------------------------------------------------------------- Lower Extremity Assessment Details Patient Name: [name] Date of Service: 06/08/2015 10:45 AM Medical Record Patient Account Number: [id number] Number: Treating RN: Clover Mealy RN, BSN, [place] Sink Date of Birth/Sex: [birth year] (50 y.o. Female) Other Clinician: Primary Care MCLEAN-SCOCOZZA, Treating ROBSON, MICHAEL Physician: French Ana Physician/Extender: Fabiola Backer, Referring Physician: Delmer Islam in Treatment: 19 Edema Assessment Assessed: [Left: No] [Right: No] Edema: [Left: Ye] [Right: s] Calf Left: Right: Point of Measurement: 36 cm From Medial Instep cm 35.2 cm Ankle Left: Right: Point of Measurement: 9 cm From Medial Instep cm 19.8 cm Vascular Assessment Claudication: Claudication Assessment [Right:None] Pulses: Posterior Tibial Dorsalis Pedis Palpable: [Right:Yes] Extremity colors, hair growth, and conditions: Extremity Color: [Right:Normal] Hair Growth on Extremity: [Right:Yes] Temperature of Extremity: [Right:Warm] Capillary Refill: [Right:< 3 seconds] Toe Nail Assessment Left:  Right: Thick: No Discolored: No Deformed: No Improper Length and Hygiene: No Electronic Signature(sTEREA, [name] ([id number]) Signed: 06/08/2015 5:12:37 PM By: Elpidio Eric BSN, RN Entered By: Elpidio Eric on 06/08/2015 11:00:51 [name] ([id number]) -------------------------------------------------------------------------------- Multi-Disciplinary Care Plan Details Patient Name: [name] Date of Service: 06/08/2015 10:45 AM Medical Record Patient Account Number: [id number] Number: Treating RN: Clover Mealy RN, BSN, [place] Sink Date of Birth/Sex: May 25, 1965 (50 y.o. Female) Other Clinician: Primary Care MCLEAN-SCOCOZZA, Treating ROBSON, MICHAEL Physician: French Ana Physician/Extender: Fabiola Backer, Referring Physician: Delmer Islam in Treatment: 63 Active Inactive Electronic Signature(s) Signed:  06/08/2015 11:29:19 AM By: Elpidio Eric BSN, RN Entered By: Elpidio Eric on 06/08/2015 11:29:18 [name] ([id number]) -------------------------------------------------------------------------------- Pain Assessment Details Patient Name: [name] Date of Service: 06/08/2015 10:45 AM Medical Record Patient Account Number: [id number] Number: Treating RN: Clover Mealy RN, BSN, [place] Sink Date of Birth/Sex: [birth year] (50 y.o. Female) Other Clinician: Primary Care MCLEAN-SCOCOZZA, Treating ROBSON, MICHAEL Physician: French Ana Physician/Extender: Fabiola Backer, Referring Physician: Delmer Islam in Treatment: 73 Active Problems Location of Pain Severity and Description of Pain Patient Has Paino No Site Locations Pain Management and Medication Current Pain Management: Electronic Signature(s) Signed: 06/08/2015 5:12:37 PM By: Elpidio Eric BSN, RN Entered By: Elpidio Eric on 06/08/2015 10:55:30 [name] ([id number]) -------------------------------------------------------------------------------- Patient/Caregiver Education Details Patient Name:  [name] Date of Service: 06/08/2015 10:45 AM Medical Record Patient Account Number: [id number] Number: Treating RN: Clover Mealy RN, BSN, [place] Sink Date of Birth/Gender: 01-Oct-1965 (50 y.o. Female) Other Clinician: Primary Care MCLEAN-SCOCOZZA, Treating ROBSON, MICHAEL Physician: French Ana Physician/Extender: Fabiola Backer, Weeks in Treatment: 21 Referring Physician: TRACY Education Assessment Education Provided To: Patient Education Topics Provided Basic Hygiene: Methods: Explain/Verbal Responses: State content correctly Wound/Skin Impairment: Methods: Explain/Verbal Responses: State content correctly Electronic Signature(s) Signed: 06/08/2015 5:12:37 PM By: Elpidio Eric BSN, RN Entered By: Elpidio Eric on 06/08/2015 11:31:16 [name] ([id number]) -------------------------------------------------------------------------------- Wound Assessment Details Patient Name: [name] Date of Service: 06/08/2015 10:45 AM Medical Record Patient Account Number: [id number] Number: Treating RN: Clover Mealy RN, BSN, [place] Sink Date of Birth/Sex: May 25, 1965 (50 y.o. Female) Other Clinician: Primary Care MCLEAN-SCOCOZZA, Treating ROBSON, MICHAEL Physician: French Ana Physician/Extender: Fabiola Backer, Referring Physician: Delmer Islam in Treatment: 19 Wound Status Wound Number: 1 Primary Infection - not elsewhere Etiology: classified Wound Location: Right Lower Leg - Medial Wound Status: Healed - Epithelialized Wounding Event: Bite Date Acquired: 12/28/2014 Weeks Of Treatment: 19 Clustered Wound: No Photos Photo Uploaded By: Elpidio Eric on 06/08/2015 17:18:57 Wound Measurements Length: (cm) 0 % Reduction i Width: (cm) 0 % Reduction i Depth: (cm) 0 Epithelializa Area: (cm) 0 Tunneling: Volume: (cm) 0 Undermining: n Area: 100% n Volume: 100% tion: Large (67-100%) No No Wound Description Full Thickness Without Exposed Classification: Support  Structures Wound Margin: Distinct, outline attached Exudate None Present Amount: Foul Odor After Cleansing: No Wound Bed Granulation Amount: None Present (0%) Exposed Structure Necrotic Amount: None Present (0%) Fascia Exposed: No [name], [name] ([id number]) Fat Layer Exposed: No Tendon Exposed: No Muscle Exposed: No Joint Exposed: No Bone Exposed: No Limited to Skin Breakdown Periwound Skin Texture Texture Color No Abnormalities Noted: No No Abnormalities Noted: No Callus: No Atrophie Blanche: No Crepitus: No Cyanosis: No Excoriation: No Ecchymosis: No Fluctuance: No Erythema: No Friable: No Hemosiderin Staining: No Induration: No Mottled: No Localized Edema: No Pallor: No Rash: No Rubor: No Scarring: No Moisture No Abnormalities Noted: No Dry / Scaly: Yes Maceration: No Moist: No Wound Preparation Ulcer Cleansing: Other: soap and water, Topical Anesthetic Applied: None Electronic Signature(s) Signed: 06/08/2015 5:12:37 PM By: Elpidio Eric BSN, RN Entered By: Elpidio Eric on 06/08/2015 11:06:15 [name] ([id number]) -------------------------------------------------------------------------------- Vitals Details Patient Name: [name] Date of Service: 06/08/2015 10:45 AM Medical Record Patient Account Number: [id number] Number: Treating RN: Clover Mealy RN, BSN, [place] Sink Date of Birth/Sex: [birth year] (50 y.o. Female) Other Clinician: Primary Care MCLEAN-SCOCOZZA, Treating ROBSON, MICHAEL Physician: French Ana Physician/Extender: Fabiola Backer, Referring Physician: Delmer Islam in Treatment: 19 Vital Signs Time Taken: 10:56 Temperature (F): 98.2 Height (in): 64 Pulse (bpm): 88 Weight (lbs): 215 Respiratory Rate (breaths/min): 18 Body Mass Index (BMI): 36.9 Blood Pressure (mmHg):  113/74 Reference Range: 80 - 120 mg / dl Electronic Signature(s) Signed: 06/08/2015 5:12:37 PM By: Elpidio Eric BSN, RN Entered By: Elpidio Eric on  06/08/2015 10:57:38

## 2015-06-09 NOTE — ED Notes (Signed)
Patient remains on the phone calling different group homes to see if they will take her. Patient is calm and cooperative at this time. Will continue to monitor for safety.

## 2015-06-09 NOTE — ED Notes (Signed)
Patient currently is eating lunch. No signs of distress noted. Maintained on 15 minute checks and observation by security camera for safety.

## 2015-06-09 NOTE — ED Provider Notes (Addendum)
Patient notified behavioral nurse she is having chest pain. She has a long history of chronic chest pain.  I will give her aspirin, obtain EKG.  EKG reviewed and interpreted by me ED ECG REPORT I, Ladarrion Telfair, the attending physician, personally viewed and interpreted this ECG.  Date: 06/09/2015 EKG Time: 1610 Rate: 75 Rhythm: normal sinus rhythm QRS Axis: normal Intervals: normal ST/T Wave abnormalities: normal Conduction Disturbances: none Narrative Interpretation: unremarkable   Sharyn Creamer, MD 06/09/15 1835   ----------------------------------------- 10:27 PM on 06/09/2015 -----------------------------------------  Patient hemodynamically stable, but having occasional loose stools. We will obtain stool culture, C. difficile. In addition ordered metabolic panel for recheck of potassium tomorrow morning. Also when necessary Imodium.    Sharyn Creamer, MD 06/09/15 2227

## 2015-06-09 NOTE — Progress Notes (Signed)
LCSW consulted with Dr Toni Amend and ED nurse and TTS- LCSW will support patient and provided her list of group homes she can call to see if they will accept her. She has given 14 day notice and needs housing. She does not meet criteria for inpatient care at this time.  [employer] LCSW [phone]

## 2015-06-09 NOTE — ED Notes (Signed)
Patient in room resting. No signs of distress noted. Maintained on 15 minute checks and observation by security camera for safety.  

## 2015-06-09 NOTE — ED Notes (Signed)
Pt. Noted in  room watching the tv.;. No complaints or concerns voiced. No distress or abnormal behavior noted. Will continue to monitor with security cameras. Q 15 minute rounds continue. 

## 2015-06-09 NOTE — ED Notes (Signed)
Patient is currently in room resting. No complaints or signs of distress. Maintained on 15 minute checks and observation by security camera for safety.

## 2015-06-09 NOTE — ED Notes (Signed)
Patient came to nurse stating that she had chest pain that she rated a 10/10. She also endorsed left arm pain. Nurse took vitals and heart rate was 78 bpm, BP 107/68, 97% SPO2. Upon discussion with physician, physician ordered EKG, 324 mg of chewable aspirin, and also 40 mEq of potassium due to her potassium level of 3.1. Will continue to monitor for pain.

## 2015-06-10 DIAGNOSIS — F329 Major depressive disorder, single episode, unspecified: Secondary | ICD-10-CM | POA: Diagnosis not present

## 2015-06-10 DIAGNOSIS — F489 Nonpsychotic mental disorder, unspecified: Secondary | ICD-10-CM | POA: Diagnosis not present

## 2015-06-10 LAB — C DIFFICILE QUICK SCREEN W PCR REFLEX
C DIFFICLE (CDIFF) ANTIGEN: NEGATIVE
C Diff interpretation: NEGATIVE
C Diff toxin: NEGATIVE

## 2015-06-10 LAB — GASTROINTESTINAL PANEL BY PCR, STOOL (REPLACES STOOL CULTURE)
Adenovirus F40/41: NOT DETECTED
Astrovirus: NOT DETECTED
CRYPTOSPORIDIUM: NOT DETECTED
CYCLOSPORA CAYETANENSIS: NOT DETECTED
Campylobacter species: NOT DETECTED
E. COLI O157: NOT DETECTED
ENTAMOEBA HISTOLYTICA: NOT DETECTED
ENTEROAGGREGATIVE E COLI (EAEC): NOT DETECTED
Enteropathogenic E coli (EPEC): NOT DETECTED
Enterotoxigenic E coli (ETEC): NOT DETECTED
GIARDIA LAMBLIA: NOT DETECTED
NOROVIRUS GI/GII: DETECTED — AB
Plesimonas shigelloides: NOT DETECTED
Rotavirus A: NOT DETECTED
SALMONELLA SPECIES: NOT DETECTED
SAPOVIRUS (I, II, IV, AND V): NOT DETECTED
SHIGELLA/ENTEROINVASIVE E COLI (EIEC): NOT DETECTED
Shiga like toxin producing E coli (STEC): NOT DETECTED
VIBRIO CHOLERAE: NOT DETECTED
Vibrio species: NOT DETECTED
YERSINIA ENTEROCOLITICA: NOT DETECTED

## 2015-06-10 LAB — BASIC METABOLIC PANEL
Anion gap: 8 (ref 5–15)
BUN: 21 mg/dL — AB (ref 6–20)
CHLORIDE: 106 mmol/L (ref 101–111)
CO2: 28 mmol/L (ref 22–32)
Calcium: 9.2 mg/dL (ref 8.9–10.3)
Creatinine, Ser: 0.63 mg/dL (ref 0.44–1.00)
GFR calc non Af Amer: >60 mL/min (ref >60)
GLUCOSE: 97 mg/dL (ref 65–99)
POTASSIUM: 4.4 mmol/L (ref 3.5–5.1)
SODIUM: 142 mmol/L (ref 135–145)

## 2015-06-10 NOTE — ED Provider Notes (Signed)
Dr. Mat Carne packs a seen and cleared the patient to follow up with her activity and return back to her group home patient agrees to do that patient's electrolytes are stable we will discharge her  Arnaldo Natal, MD 06/10/15 [phone]

## 2015-06-10 NOTE — ED Notes (Addendum)
Telephone call from the lab to state that patient's results were negative for C-Diff and positive for Norovirus; ER-MD has been made aware.

## 2015-06-10 NOTE — Progress Notes (Signed)
LCSW spoke to nurse ( last shift) Group home is to pick up patient.  [employer] LCSW [phone]

## 2015-06-10 NOTE — Progress Notes (Signed)
[facility] Adeline Called and informed this worker that [name] has brought the "Compentency letter" over to DSS and Ms Belinda Fisher [phone] is reviewing patient history to deem patient incompetent.  Patient will also be informed that she is to restart her Together house program, they will request additional support for patient ( 1-1 in home support) this funding process will take along time, even if approved. New [facility] will be Kathrin Ruddy [phone]  [employer] LCSW [phone]

## 2015-06-10 NOTE — Progress Notes (Signed)
LCSW received call from [facility]- and in discovery [name] is the [facility]. Patient is attached to the together house run by [employer], the last time she attending program was 03/15/15. She is supported by [employer] in the community.  LCSW suggested a behavior modification specialist, CCC explained( NO FUNDING) that they will be working to have patient deemed incompetent and have a Guardian assigned. LCSW and CCC reviewed over 14 group homes that patient has either resided or left. LCSW will contact [facility] to see if he has any bed availability.  [facility] has stated that even though she has given her 14 day notice to existing group home patient is to return upon discharge. It was explained that the patient should not have a list of group homes to call because she will call up to 30x even after they state no beds. CCC strongly advises not to provide a list for patient to call. LCSW will make some enquiries and keep patient informed of her current option.   [employer] LCSW [phone]

## 2015-06-10 NOTE — ED Notes (Signed)
Social worker at bedside.  Spoke with Burna Mortimer from pt Group Home, discharge papers reviewed.  Burna Mortimer states that cab will be called to pick up pt.
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
## 2015-06-10 NOTE — ED Notes (Signed)
Pt states she does not wish to return to group home.  Social worker contacted to speak with pt.

## 2015-06-10 NOTE — ED Notes (Signed)
Pt. Noted in room resting quietly;. No complaints or concerns voiced. No distress or abnormal behavior noted. Will continue to monitor with security cameras. Q 15 minute rounds continue. 

## 2015-06-10 NOTE — Progress Notes (Signed)
Spoke to Group home provider. When patient medically cleared she can go back to Group Home, Call Rebecka Apley [phone] and they will come and get her.  Tung Pustejovsky LCSW

## 2015-06-10 NOTE — ED Notes (Signed)
Patient positive for norovirus. Transferred back to main Ed room 24 to be maintained on contact isolation. Patient cooperative with transfer. Nursing report given to Florentina Addison, RN.

## 2015-06-10 NOTE — Progress Notes (Addendum)
LCSW consulted with [facility] nurse and was informed that [facility] Coordinator will not allow patient to go to other group home. LCSW called [facility] Natal [phone] Unable to leave message as her voice box full called her supervisor left message

## 2015-06-10 NOTE — ED Provider Notes (Signed)
-----------------------------------------   9:17 AM on 06/10/2015 -----------------------------------------   Blood pressure 117/78, pulse 80, temperature 98.9 F (37.2 C), temperature source Oral, resp. rate 18, height  (1.6 m), weight 207 lb 14.4 oz (94.303 kg), SpO2 98 %.  Patient was having continuous diarrhea so a stool panel was ordered. She was found to have norovirus and returned to the major ED to be placed on enteric precautions. Calm and cooperative at this time.  Disposition is pending per Psychiatry/Behavioral Medicine team recommendations.     Irean Hong, MD 06/10/15 [phone]

## 2015-06-12 ENCOUNTER — Encounter: Payer: Self-pay | Admitting: Emergency Medicine

## 2015-06-12 ENCOUNTER — Emergency Department
Admission: EM | Admit: 2015-06-12 | Discharge: 2015-06-12 | Disposition: A | Discharge status: Home / Self Care | Payer: Medicare Other | Attending: Student | Admitting: Student

## 2015-06-12 ENCOUNTER — Emergency Department: Payer: Medicare Other

## 2015-06-12 DIAGNOSIS — Z79899 Other long term (current) drug therapy: Secondary | ICD-10-CM | POA: Diagnosis not present

## 2015-06-12 DIAGNOSIS — R0789 Other chest pain: Secondary | ICD-10-CM | POA: Diagnosis not present

## 2015-06-12 DIAGNOSIS — Z792 Long term (current) use of antibiotics: Secondary | ICD-10-CM | POA: Diagnosis not present

## 2015-06-12 DIAGNOSIS — R110 Nausea: Secondary | ICD-10-CM | POA: Diagnosis not present

## 2015-06-12 DIAGNOSIS — Z7982 Long term (current) use of aspirin: Secondary | ICD-10-CM | POA: Insufficient documentation

## 2015-06-12 DIAGNOSIS — Z880 Allergy status to penicillin: Secondary | ICD-10-CM | POA: Insufficient documentation

## 2015-06-12 DIAGNOSIS — R079 Chest pain, unspecified: Secondary | ICD-10-CM | POA: Diagnosis present

## 2015-06-12 DIAGNOSIS — Z7951 Long term (current) use of inhaled steroids: Secondary | ICD-10-CM | POA: Insufficient documentation

## 2015-06-12 LAB — BASIC METABOLIC PANEL
ANION GAP: 8 (ref 5–15)
BUN: 15 mg/dL (ref 6–20)
CALCIUM: 9 mg/dL (ref 8.9–10.3)
CO2: 26 mmol/L (ref 22–32)
Chloride: 107 mmol/L (ref 101–111)
Creatinine, Ser: 0.86 mg/dL (ref 0.44–1.00)
GFR calc Af Amer: >60 mL/min (ref >60)
GLUCOSE: 90 mg/dL (ref 65–99)
Potassium: 4.6 mmol/L (ref 3.5–5.1)
Sodium: 141 mmol/L (ref 135–145)

## 2015-06-12 LAB — CBC
HCT: 36.9 % (ref 35.0–47.0)
HEMOGLOBIN: 12.6 g/dL (ref 12.0–16.0)
MCH: 31.3 pg (ref 26.0–34.0)
MCHC: 34.3 g/dL (ref 32.0–36.0)
MCV: 91.2 fL (ref 80.0–100.0)
Platelets: 315 10*3/uL (ref 150–440)
RBC: 4.04 MIL/uL (ref 3.80–5.20)
RDW: 13.9 % (ref 11.5–14.5)
WBC: 6.2 10*3/uL (ref 3.6–11.0)

## 2015-06-12 LAB — TROPONIN I

## 2015-06-12 MED ORDER — ACETAMINOPHEN 500 MG PO TABS
1000.0000 mg | ORAL_TABLET | Freq: Once | ORAL | Status: AC
  Administered 2015-06-12: 1000 mg via ORAL
  Filled 2015-06-12: qty 2

## 2015-06-12 NOTE — ED Notes (Signed)
Pt requested charge nurse - donald in to speak with pt after discharge.

## 2015-06-12 NOTE — ED Provider Notes (Signed)
[facility] Emergency Department Provider Note  ____________________________________________  Time seen: Approximately 12:20 PM  I have reviewed the triage vital signs and the nursing notes.   HISTORY  Chief Complaint Chest Pain    HPI Doreather Hoxworth is a 50 y.o. female G of GERD, borderline personality disorder, schizoaffective disorder, COPD, cervical palsy/mental retardation, frequent complaints of chest pain who presents for evaluation of constant midsternal chest pain today which she describes as sharp, gradual onset this morning, constant since onset, worse "when you mash on it". Patient reports that since early this morning she has been "been feeling real bad... My chest hurts and I couldn't eat my breakfast... I think it's that group home that's making me sick". She reports she is nauseated today but has had no vomiting or diarrhea. She is currently complaining that she is very hungry. She denies any shortness of breath. Chest pain is not radiating, not worsened with exertion, not pleuritic. No dysuria. The patient reports to me that she needs to move to another group home because she really hates her current grouphome and she wants to move out. She denies any fevers or chills. No suicidal ideation, homicidal ideation or audiovisual hallucinations.   Past Medical History  Diagnosis Date  . Asthma   . Anxiety   . GERD (gastroesophageal reflux disease)   . Mild cognitive impairment   . Cerebral palsy (HCC)   . Borderline personality disorder   . Depression   . Wound abscess   . Schizoaffective disorder [facility])     Patient Active Problem List   Diagnosis Date Noted  . Suicidal behavior 06/09/2015  . Anxiety 12/31/2014  . Cellulitis and abscess of leg 12/31/2014  . 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 GERD (gastroesophageal reflux disease) 09/24/2014  . COPD (chronic obstructive pulmonary disease) (HCC) 09/24/2014  . Borderline personality  disorder 09/24/2014  . Major depressive disorder, recurrent episode, moderate (HCC) 09/24/2014  . Cerebral palsy (HCC) 08/31/2014    Past Surgical History  Procedure Laterality Date  . Cholecystectomy    . Tonsillectomy    . Incision and drainage abscess Right 01/02/2015    Procedure: INCISION AND DRAINAGE ABSCESS;  Surgeon: Tiney Rouge III, MD;  Location: [facility] ORS;  Service: General;  Laterality: Right;    Current Outpatient Rx  Name  Route  Sig  Dispense  Refill  . acetaminophen (TYLENOL) 500 MG tablet   Oral   Take 500 mg by mouth every 6 (six) hours as needed for mild pain, fever or headache.          Marland Kitchen aspirin EC 81 MG tablet   Oral   Take 81 mg by mouth daily.         Marland Kitchen dicyclomine (BENTYL) 20 MG tablet   Oral   Take 1 tablet (20 mg total) by mouth 3 (three) times daily as needed for spasms.   30 tablet   0   . diphenoxylate-atropine (LOMOTIL) 2.5-0.025 MG per tablet   Oral   Take 1 tablet by mouth every 6 (six) hours as needed for diarrhea or loose stools.         . divalproex (DEPAKOTE ER) 250 MG 24 hr tablet   Oral   Take 500 mg by mouth 2 (two) times daily.         Marland Kitchen escitalopram (LEXAPRO) 20 MG tablet   Oral   Take 1 tablet (20 mg total) by mouth at bedtime.   30 tablet   0   .  fenofibrate 54 MG tablet   Oral   Take 54 mg by mouth daily.         . Fluticasone-Salmeterol (ADVAIR) 100-50 MCG/DOSE AEPB   Inhalation   Inhale 1 puff into the lungs 2 (two) times daily.          Marland Kitchen guaiFENesin (MUCINEX) 600 MG 12 hr tablet   Oral   Take 600 mg by mouth 2 (two) times daily as needed for cough or to loosen phlegm.         Marland Kitchen guaiFENesin-codeine 100-10 MG/5ML syrup   Oral   Take 10 mLs by mouth every 4 (four) hours as needed for cough. Patient not taking: Reported on 06/08/2015   180 mL   0   . hydrOXYzine (ATARAX/VISTARIL) 50 MG tablet   Oral   Take 50 mg by mouth at bedtime.         Marland Kitchen ibuprofen (ADVIL,MOTRIN) 800 MG tablet   Oral   Take  800 mg by mouth every 8 (eight) hours as needed for mild pain.         Marland Kitchen LORazepam (ATIVAN) 0.5 MG tablet   Oral   Take 0.5 mg by mouth every 4 (four) hours as needed for anxiety.         Marland Kitchen LORazepam (ATIVAN) 1 MG tablet   Oral   Take 1 mg by mouth 2 (two) times daily.         . meloxicam (MOBIC) 15 MG tablet   Oral   Take 15 mg by mouth daily.         . miconazole (ZEASORB-AF) 2 % powder   Topical   Apply 1 application topically 2 (two) times daily.         . QUEtiapine (SEROQUEL) 25 MG tablet   Oral   Take 25 mg by mouth 4 (four) times daily as needed (for restlessness).         . risperiDONE (RISPERDAL) 1 MG tablet   Oral   Take 1 mg by mouth 2 (two) times daily.           Allergies Penicillins  Family History  Problem Relation Age of Onset  . Diabetes Mother   . Heart attack Father     Social History Social History  Substance Use Topics  . Smoking status: Never Smoker   . Smokeless tobacco: Never Used  . Alcohol Use: No    Review of Systems Constitutional: No fever/chills Eyes: No visual changes. ENT: No sore throat. Cardiovascular: + chest pain. Respiratory: Denies shortness of breath. Gastrointestinal: No abdominal pain.  + nausea, no vomiting.  No diarrhea.  No constipation. Genitourinary: Negative for dysuria. Musculoskeletal: Negative for back pain. Skin: Negative for rash. Neurological: Negative for headaches, focal weakness or numbness.  10-point ROS otherwise negative.  ____________________________________________   PHYSICAL EXAM:  VITAL SIGNS: ED Triage Vitals  Enc Vitals Group     BP 06/12/15 1130 117/98 mmHg     Pulse Rate 06/12/15 1130 85     Resp 06/12/15 1130 18     Temp 06/12/15 1130 98.3 F (36.8 C)     Temp Source 06/12/15 1130 Oral     SpO2 06/12/15 1130 97 %     Weight 06/12/15 1130 207 lb (93.895 kg)     Height 06/12/15 1130  (1.6 m)     Head Cir --      Peak Flow --      Pain Score 06/12/15 1149  10  Pain Loc --      Pain Edu? --      Excl. in GC? --     Constitutional: Alert and oriented. Nontoxic appearing and in no acute distress, mild cognitive impairment but answers questions appropriately, follows commands. Eyes: Conjunctivae are normal. PERRL. EOMI. Head: Atraumatic. Nose: No congestion/rhinnorhea. Mouth/Throat: Mucous membranes are moist.  Oropharynx non-erythematous. Neck: No stridor. Both without meningismus. Cardiovascular: Normal rate, regular rhythm. Grossly normal heart sounds.  Good peripheral circulation. Respiratory: Normal respiratory effort.  No retractions. Lungs CTAB. Gastrointestinal: Soft and nontender. No distention.  No CVA tenderness. Genitourinary: deferred Musculoskeletal: No lower extremity tenderness nor edema.  No joint effusions. Tenderness to palpation to the left and the right of the upper sternal border, the patient screams "ouch" with palpation. Pain is also reproduced with engagement of the pectoralis muscles bilaterally. No calf swelling/tenderness/asymmetry. Neurologic:  Normal speech and language. No gross focal neurologic deficits are appreciated. No gait instability. Skin:  Skin is warm, dry and intact. No rash noted. Psychiatric: Mood and affect are normal. Speech and behavior are normal.  ____________________________________________   LABS (all labs ordered are listed, but only abnormal results are displayed)  Labs Reviewed  BASIC METABOLIC PANEL  CBC  TROPONIN I   ____________________________________________  EKG  ED ECG REPORT I, Gayla Doss, the attending physician, personally viewed and interpreted this ECG.   Date: 06/12/2015  EKG Time: 11:46  Rate: 75  Rhythm: normal sinus rhythm  Axis: normal  Intervals:none  ST&T Change: No acute ST elevation. EKG is unchanged from prior.  ____________________________________________  RADIOLOGY  CXR  IMPRESSION: No active cardiopulmonary  disease.  ____________________________________________   PROCEDURES  Procedure(s) performed: None  Critical Care performed: No  ____________________________________________   INITIAL IMPRESSION / ASSESSMENT AND PLAN / ED COURSE  Pertinent labs & imaging results that were available during my care of the patient were reviewed by me and considered in my medical decision making (see chart for details).  Bonne Whack is a 50 y.o. female G of GERD, borderline personality disorder, schizoaffective disorder, COPD, cervical palsy/mental retardation, frequent complaints of chest pain who presents for evaluation of constant midsternal chest pain today which she describes as sharp, gradual onset this morning, constant since onset, worse "when you mash on it". On exam, she is nontoxic appearing and in no acute distress. Vital signs stable, she is afebrile. Her pain appears to be completely reproducible on exam and I suspect her pain is musculoskeletal in nature, possibly costochondritis. EKG is unchanged from prior. Troponin negative, CBC and BMP unremarkable. No shortness of breath, hypoxia, clinical evidence of DVT, pain not ripping or tearing in nature and nonradiating and I doubt PE or acute aortic dissection. We discussed symptomatic treatment, return precautions and need for close PCP follow-up. I told her that she would return to her group home today and that additional living options were not going to examined in the emergency department today. She voices understanding of this. ____________________________________________   FINAL CLINICAL IMPRESSION(S) / ED DIAGNOSES  Final diagnoses:  Chest wall pain      Gayla Doss, MD 06/12/15 [phone]

## 2015-06-12 NOTE — ED Notes (Signed)
Pt states she is having cp and lower back pain, and also that she is really wanting to not be at her group home because she hates it.

## 2015-06-12 NOTE — ED Notes (Signed)
Pt states here for back pain and chest pain that hurts when palpated. Requesting food - advised she can not eat today until her labs come back. Pt on tylenol, ibuprofen and mobic at the group home. States she did not take any today. Pt also requesting help from a "case manager" to put her into a new group home. Spoke with tts - they do not handle that, it is social work or her own group home can help her. Pt was advised the same a few days ago when she was here. Pt states understanding that she needs to talk with her group home.

## 2015-06-12 NOTE — Progress Notes (Signed)
LCSW encountered patient in the ED lobby. She reported she was only here for medical issues. LCSW reviewed what her plan of care is. She reported she will [facility], take her medications and speak to her staff about her current issues. She will be obtaining a guardian through DSS according to [facility] and her phone access will be limited because of excessive calls to people. Patient was polite and SW saw her off in the cab. She was encouraged to follow up her medical issues with her primary doctor and not to come to ED unless she was in TRUE medical crisis. Patient and SW reviewed the differrences and she will try.  LCSW offered patient encouragement and reminded her of the good time she has at day program. Patient agreed and left. [employer] LCSW [phone]

## 2015-06-13 ENCOUNTER — Emergency Department
Admission: EM | Admit: 2015-06-13 | Discharge: 2015-06-13 | Disposition: A | Discharge status: Home / Self Care | Payer: Medicare Other | Attending: Emergency Medicine | Admitting: Emergency Medicine

## 2015-06-13 ENCOUNTER — Encounter: Payer: Self-pay | Admitting: Emergency Medicine

## 2015-06-13 DIAGNOSIS — Z791 Long term (current) use of non-steroidal anti-inflammatories (NSAID): Secondary | ICD-10-CM | POA: Diagnosis not present

## 2015-06-13 DIAGNOSIS — Z7951 Long term (current) use of inhaled steroids: Secondary | ICD-10-CM | POA: Diagnosis not present

## 2015-06-13 DIAGNOSIS — Z79899 Other long term (current) drug therapy: Secondary | ICD-10-CM | POA: Diagnosis not present

## 2015-06-13 DIAGNOSIS — Z880 Allergy status to penicillin: Secondary | ICD-10-CM | POA: Diagnosis not present

## 2015-06-13 DIAGNOSIS — Z7982 Long term (current) use of aspirin: Secondary | ICD-10-CM | POA: Diagnosis not present

## 2015-06-13 DIAGNOSIS — F419 Anxiety disorder, unspecified: Secondary | ICD-10-CM | POA: Insufficient documentation

## 2015-06-13 DIAGNOSIS — F329 Major depressive disorder, single episode, unspecified: Secondary | ICD-10-CM | POA: Diagnosis present

## 2015-06-13 NOTE — ED Notes (Addendum)
Patient to ER via [facility] for c/o depression with SI. Patient has been seen here several times in last week. Patient states she does not like where she is staying and believes that is why she is feeling the way she feels, and states she is not eating much because of it. Patient states she has thoughts of walking out in front of a car or taking pills to commit suicide. Patient does not have access to pills at facility she is staying at [facility] Patient states she is the one that called 911 to come to ER, that she only came to speak to social worker for different placement.

## 2015-06-13 NOTE — Discharge Instructions (Signed)
Generalized Anxiety Disorder Generalized anxiety disorder (GAD) is a mental disorder. It interferes with life functions, including relationships, work, and school. GAD is different from normal anxiety, which everyone experiences at some point in their lives in response to specific life events and activities. Normal anxiety actually helps us prepare for and get through these life events and activities. Normal anxiety goes away after the event or activity is over.  GAD causes anxiety that is not necessarily related to specific events or activities. It also causes excess anxiety in proportion to specific events or activities. The anxiety associated with GAD is also difficult to control. GAD can vary from mild to severe. People with severe GAD can have intense waves of anxiety with physical symptoms (panic attacks).  SYMPTOMS The anxiety and worry associated with GAD are difficult to control. This anxiety and worry are related to many life events and activities and also occur more days than not for 6 months or longer. People with GAD also have three or more of the following symptoms (one or more in children):  Restlessness.   Fatigue.  Difficulty concentrating.   Irritability.  Muscle tension.  Difficulty sleeping or unsatisfying sleep. DIAGNOSIS GAD is diagnosed through an assessment by your health care provider. Your health care provider will ask you questions aboutyour mood,physical symptoms, and events in your life. Your health care provider may ask you about your medical history and use of alcohol or drugs, including prescription medicines. Your health care provider may also do a physical exam and blood tests. Certain medical conditions and the use of certain substances can cause symptoms similar to those associated with GAD. Your health care provider may refer you to a mental health specialist for further evaluation. TREATMENT The following therapies are usually used to treat GAD:    Medication. Antidepressant medication usually is prescribed for long-term daily control. Antianxiety medicines may be added in severe cases, especially when panic attacks occur.   Talk therapy (psychotherapy). Certain types of talk therapy can be helpful in treating GAD by providing support, education, and guidance. A form of talk therapy called cognitive behavioral therapy can teach you healthy ways to think about and react to daily life events and activities.  Stress managementtechniques. These include yoga, meditation, and exercise and can be very helpful when they are practiced regularly. A mental health specialist can help determine which treatment is best for you. Some people see improvement with one therapy. However, other people require a combination of therapies.   This information is not intended to replace advice given to you by your health care provider. Make sure you discuss any questions you have with your health care provider.   Document Released: 07/29/2012 Document Revised: 04/24/2014 Document Reviewed: 07/29/2012 Elsevier Interactive Patient Education 2016 [employer]  

## 2015-06-13 NOTE — ED Provider Notes (Signed)
[facility] Emergency Department Provider Note  ____________________________________________   I have reviewed the triage vital signs and the nursing notes.   HISTORY  Chief Complaint Depression    HPI [name] is a 50 y.o. female who is well-known to our ED who presents today with complaints of depression. Her complaint is similar to prior complaints and that she is not happy at her group home and hence she is depressed. She has been evaluated numerous times in our ED and by multiple psychiatrists in the last month all of whom have agreed she does not need to be admitted. In discussions with the patient she admits to me that she would not hurt herself but that she really think she needs a new group home.     Past Medical History  Diagnosis Date  . Asthma   . Anxiety   . GERD (gastroesophageal reflux disease)   . Mild cognitive impairment   . Cerebral palsy (HCC)   . Borderline personality disorder   . Depression   . Wound abscess   . Schizoaffective disorder [facility])     Patient Active Problem List   Diagnosis Date Noted  . Suicidal behavior 06/09/2015  . Anxiety 12/31/2014  . Cellulitis and abscess of leg 12/31/2014  . 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 GERD (gastroesophageal reflux disease) 09/24/2014  . COPD (chronic obstructive pulmonary disease) (HCC) 09/24/2014  . Borderline personality disorder 09/24/2014  . Major depressive disorder, recurrent episode, moderate (HCC) 09/24/2014  . Cerebral palsy (HCC) 08/31/2014    Past Surgical History  Procedure Laterality Date  . Cholecystectomy    . Tonsillectomy    . Incision and drainage abscess Right 01/02/2015    Procedure: INCISION AND DRAINAGE ABSCESS;  Surgeon: Tiney Rouge III, MD;  Location: [facility] ORS;  Service: General;  Laterality: Right;    Current Outpatient Rx  Name  Route  Sig  Dispense  Refill  . acetaminophen (TYLENOL) 500 MG tablet   Oral   Take 500 mg by mouth  every 6 (six) hours as needed for mild pain, fever or headache.          Marland Kitchen aspirin EC 81 MG tablet   Oral   Take 81 mg by mouth daily.         Marland Kitchen dicyclomine (BENTYL) 20 MG tablet   Oral   Take 1 tablet (20 mg total) by mouth 3 (three) times daily as needed for spasms.   30 tablet   0   . diphenoxylate-atropine (LOMOTIL) 2.5-0.025 MG per tablet   Oral   Take 1 tablet by mouth every 6 (six) hours as needed for diarrhea or loose stools.         . divalproex (DEPAKOTE ER) 250 MG 24 hr tablet   Oral   Take 500 mg by mouth 2 (two) times daily.         Marland Kitchen escitalopram (LEXAPRO) 20 MG tablet   Oral   Take 1 tablet (20 mg total) by mouth at bedtime.   30 tablet   0   . fenofibrate 54 MG tablet   Oral   Take 54 mg by mouth daily.         . Fluticasone-Salmeterol (ADVAIR) 100-50 MCG/DOSE AEPB   Inhalation   Inhale 1 puff into the lungs 2 (two) times daily.          Marland Kitchen guaiFENesin (MUCINEX) 600 MG 12 hr tablet   Oral   Take 600 mg by mouth 2 (  two) times daily as needed for cough or to loosen phlegm.         Marland Kitchen guaiFENesin-codeine 100-10 MG/5ML syrup   Oral   Take 10 mLs by mouth every 4 (four) hours as needed for cough. Patient not taking: Reported on 06/08/2015   180 mL   0   . hydrOXYzine (ATARAX/VISTARIL) 50 MG tablet   Oral   Take 50 mg by mouth at bedtime.         Marland Kitchen ibuprofen (ADVIL,MOTRIN) 800 MG tablet   Oral   Take 800 mg by mouth every 8 (eight) hours as needed for mild pain.         Marland Kitchen LORazepam (ATIVAN) 0.5 MG tablet   Oral   Take 0.5 mg by mouth every 4 (four) hours as needed for anxiety.         Marland Kitchen LORazepam (ATIVAN) 1 MG tablet   Oral   Take 1 mg by mouth 2 (two) times daily.         . meloxicam (MOBIC) 15 MG tablet   Oral   Take 15 mg by mouth daily.         . miconazole (ZEASORB-AF) 2 % powder   Topical   Apply 1 application topically 2 (two) times daily.         . QUEtiapine (SEROQUEL) 25 MG tablet   Oral   Take 25 mg by  mouth 4 (four) times daily as needed (for restlessness).         . risperiDONE (RISPERDAL) 1 MG tablet   Oral   Take 1 mg by mouth 2 (two) times daily.           Allergies Penicillins  Family History  Problem Relation Age of Onset  . Diabetes Mother   . Heart attack Father     Social History Social History  Substance Use Topics  . Smoking status: Never Smoker   . Smokeless tobacco: Never Used  . Alcohol Use: No    Review of Systems  Constitutional: Negative for fever.  ENT: Negative for sore throat Cardiovascular: Negative for chest pain. Respiratory: Negative for cough Gastrointestinal: Negative for abdominal pain Genitourinary: Negative for dysuria. Musculoskeletal: Negative for back pain. Skin: Negative for injury Neurological: Negative for headaches  Psychiatric: Anxious    ____________________________________________   PHYSICAL EXAM:  VITAL SIGNS: ED Triage Vitals  Enc Vitals Group     BP 06/13/15 1439 139/85 mmHg     Pulse Rate 06/13/15 1439 84     Resp 06/13/15 1439 18     Temp 06/13/15 1439 98.3 F (36.8 C)     Temp Source 06/13/15 1439 Oral     SpO2 06/13/15 1439 98 %     Weight 06/13/15 1439 207 lb (93.895 kg)     Height 06/13/15 1439 5\' 3"  (1.6 m)     Head Cir --      Peak Flow --      Pain Score 06/13/15 1440 0     Pain Loc --      Pain Edu? --      Excl. in GC? --      Constitutional: No distress Eyes: Conjunctivae are normal.  ENT   Head: Normocephalic and atraumatic.   Mouth/Throat: Mucous membranes are moist. Cardiovascular: Normal rate, regular rhythm. Normal and symmetric distal pulses are present in all extremities.  Respiratory: Normal respiratory effort without tachypnea nor retractions.  Gastrointestinal: Soft and non-tender in all quadrants. No distention.. Genitourinary: deferred  Musculoskeletal: Nontender with normal range of motion in all extremities. No lower extremity tenderness nor edema. Neurologic:    No gross focal neurologic deficits are appreciated. Skin:  Skin is warm, dry and intact. No rash noted. Psychiatric: Mood is anxious, affect is flat  ____________________________________________    LABS (pertinent positives/negatives)  Labs Reviewed - No data to display  ____________________________________________   EKG  None  ____________________________________________    RADIOLOGY I have personally reviewed any xrays that were ordered on this patient: None  ____________________________________________   PROCEDURES  Procedure(s) performed: none  Critical Care performed: none  ____________________________________________   INITIAL IMPRESSION / ASSESSMENT AND PLAN / ED COURSE  Pertinent labs & imaging results that were available during my care of the patient were reviewed by me and considered in my medical decision making (see chart for details).  Patient with repeated visits to our emergency department. She was just discharged yesterday. Dr. Toni Amend of psychiatry just saw her and as he has many times before recommended discharge. There is been no significant change in her condition since yesterday. Hence I feel that further consultation is not warranted and discharge is appropriate at this time. I discussed this with her and she is accepting and does contract for safety.  ____________________________________________   FINAL CLINICAL IMPRESSION(S) / ED DIAGNOSES  Final diagnoses:  Anxiety     Jene Every, MD 06/13/15 1501

## 2015-06-16 ENCOUNTER — Encounter: Payer: Self-pay | Admitting: Emergency Medicine

## 2015-06-16 ENCOUNTER — Emergency Department
Admission: EM | Admit: 2015-06-16 | Discharge: 2015-06-17 | Disposition: A | Discharge status: Other Acute Inpatient Hospital | Payer: Medicare Other | Attending: Emergency Medicine | Admitting: Emergency Medicine

## 2015-06-16 ENCOUNTER — Emergency Department: Payer: Medicare Other

## 2015-06-16 DIAGNOSIS — F603 Borderline personality disorder: Secondary | ICD-10-CM | POA: Diagnosis present

## 2015-06-16 DIAGNOSIS — Z7951 Long term (current) use of inhaled steroids: Secondary | ICD-10-CM | POA: Insufficient documentation

## 2015-06-16 DIAGNOSIS — E669 Obesity, unspecified: Secondary | ICD-10-CM | POA: Insufficient documentation

## 2015-06-16 DIAGNOSIS — F1310 Sedative, hypnotic or anxiolytic abuse, uncomplicated: Secondary | ICD-10-CM | POA: Insufficient documentation

## 2015-06-16 DIAGNOSIS — J449 Chronic obstructive pulmonary disease, unspecified: Secondary | ICD-10-CM | POA: Diagnosis present

## 2015-06-16 DIAGNOSIS — Z7982 Long term (current) use of aspirin: Secondary | ICD-10-CM | POA: Insufficient documentation

## 2015-06-16 DIAGNOSIS — R45851 Suicidal ideations: Secondary | ICD-10-CM | POA: Diagnosis not present

## 2015-06-16 DIAGNOSIS — Z79899 Other long term (current) drug therapy: Secondary | ICD-10-CM | POA: Insufficient documentation

## 2015-06-16 DIAGNOSIS — Z791 Long term (current) use of non-steroidal anti-inflammatories (NSAID): Secondary | ICD-10-CM | POA: Diagnosis not present

## 2015-06-16 DIAGNOSIS — F250 Schizoaffective disorder, bipolar type: Secondary | ICD-10-CM | POA: Diagnosis not present

## 2015-06-16 DIAGNOSIS — F329 Major depressive disorder, single episode, unspecified: Secondary | ICD-10-CM | POA: Insufficient documentation

## 2015-06-16 DIAGNOSIS — F70 Mild intellectual disabilities: Secondary | ICD-10-CM | POA: Diagnosis present

## 2015-06-16 DIAGNOSIS — Z880 Allergy status to penicillin: Secondary | ICD-10-CM | POA: Insufficient documentation

## 2015-06-16 DIAGNOSIS — R079 Chest pain, unspecified: Secondary | ICD-10-CM | POA: Diagnosis present

## 2015-06-16 DIAGNOSIS — R440 Auditory hallucinations: Secondary | ICD-10-CM | POA: Diagnosis not present

## 2015-06-16 DIAGNOSIS — G809 Cerebral palsy, unspecified: Secondary | ICD-10-CM | POA: Diagnosis present

## 2015-06-16 LAB — BASIC METABOLIC PANEL
Anion gap: 6 (ref 5–15)
BUN: 27 mg/dL — ABNORMAL HIGH (ref 6–20)
CALCIUM: 9 mg/dL (ref 8.9–10.3)
CO2: 30 mmol/L (ref 22–32)
CREATININE: 0.92 mg/dL (ref 0.44–1.00)
Chloride: 107 mmol/L (ref 101–111)
GFR calc non Af Amer: >60 mL/min (ref >60)
GLUCOSE: 100 mg/dL — AB (ref 65–99)
Potassium: 3.8 mmol/L (ref 3.5–5.1)
Sodium: 143 mmol/L (ref 135–145)

## 2015-06-16 LAB — URINE DRUG SCREEN, QUALITATIVE (ARMC ONLY)
Amphetamines, Ur Screen: NOT DETECTED
BENZODIAZEPINE, UR SCRN: POSITIVE — AB
Barbiturates, Ur Screen: NOT DETECTED
CANNABINOID 50 NG, UR ~~LOC~~: NOT DETECTED
Cocaine Metabolite,Ur ~~LOC~~: NOT DETECTED
MDMA (Ecstasy)Ur Screen: NOT DETECTED
Methadone Scn, Ur: NOT DETECTED
Opiate, Ur Screen: NOT DETECTED
PHENCYCLIDINE (PCP) UR S: NOT DETECTED
Tricyclic, Ur Screen: POSITIVE — AB

## 2015-06-16 LAB — TROPONIN I: Troponin I: <0.03 ng/mL (ref <0.031)

## 2015-06-16 LAB — CBC
HCT: 37 % (ref 35.0–47.0)
Hemoglobin: 12.4 g/dL (ref 12.0–16.0)
MCH: 30.5 pg (ref 26.0–34.0)
MCHC: 33.4 g/dL (ref 32.0–36.0)
MCV: 91.4 fL (ref 80.0–100.0)
PLATELETS: 329 10*3/uL (ref 150–440)
RBC: 4.05 MIL/uL (ref 3.80–5.20)
RDW: 14.5 % (ref 11.5–14.5)
WBC: 7.1 10*3/uL (ref 3.6–11.0)

## 2015-06-16 LAB — ACETAMINOPHEN LEVEL

## 2015-06-16 LAB — SALICYLATE LEVEL: Salicylate Lvl: <4 mg/dL (ref 2.8–30.0)

## 2015-06-16 LAB — ETHANOL: Alcohol, Ethyl (B): <5 mg/dL (ref <5)

## 2015-06-16 NOTE — ED Notes (Signed)
Presents via ems with c/o chest pain   But told ems that she wants out of group home

## 2015-06-16 NOTE — ED Provider Notes (Signed)
[facility] Emergency Department Provider Note  ____________________________________________  Time seen: Approximately 6:37 PM  I have reviewed the triage vital signs and the nursing notes.   HISTORY  Chief Complaint Chest Pain    HPI [name] is a 50 y.o. female with a history of cerebral palsy, depression, purulent personality disorder, and schizoaffective disorder presenting for not liking her group home, and suicidal ideations. When I asked the patient if she is here because she does not like her group home, she states "that's part of it." She says "I don't care about my life" and reports that she has been hearing voices that have been telling her to jump in front of a car in order to kill herself. She states, "I'm going to do it." She denies any homicidal ideations. She denies any medical complaints.   Past Medical History  Diagnosis Date  . Asthma   . Anxiety   . GERD (gastroesophageal reflux disease)   . Mild cognitive impairment   . Cerebral palsy (HCC)   . Borderline personality disorder   . Depression   . Wound abscess   . Schizoaffective disorder [facility])     Patient Active Problem List   Diagnosis Date Noted  . Suicidal behavior 06/09/2015  . Anxiety 12/31/2014  . Cellulitis and abscess of leg 12/31/2014  . 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 GERD (gastroesophageal reflux disease) 09/24/2014  . COPD (chronic obstructive pulmonary disease) (HCC) 09/24/2014  . Borderline personality disorder 09/24/2014  . Major depressive disorder, recurrent episode, moderate (HCC) 09/24/2014  . Cerebral palsy (HCC) 08/31/2014    Past Surgical History  Procedure Laterality Date  . Cholecystectomy    . Tonsillectomy    . Incision and drainage abscess Right 01/02/2015    Procedure: INCISION AND DRAINAGE ABSCESS;  Surgeon: Tiney Rouge III, MD;  Location: [facility] ORS;  Service: General;  Laterality: Right;    Current Outpatient Rx  Name  Route   Sig  Dispense  Refill  . acetaminophen (TYLENOL) 500 MG tablet   Oral   Take 500 mg by mouth every 6 (six) hours as needed for mild pain, fever or headache.          Marland Kitchen aspirin EC 81 MG tablet   Oral   Take 81 mg by mouth daily.         Marland Kitchen dicyclomine (BENTYL) 20 MG tablet   Oral   Take 1 tablet (20 mg total) by mouth 3 (three) times daily as needed for spasms.   30 tablet   0   . diphenoxylate-atropine (LOMOTIL) 2.5-0.025 MG per tablet   Oral   Take 1 tablet by mouth every 6 (six) hours as needed for diarrhea or loose stools.         . divalproex (DEPAKOTE ER) 250 MG 24 hr tablet   Oral   Take 500 mg by mouth 2 (two) times daily.         Marland Kitchen escitalopram (LEXAPRO) 20 MG tablet   Oral   Take 1 tablet (20 mg total) by mouth at bedtime.   30 tablet   0   . fenofibrate 54 MG tablet   Oral   Take 54 mg by mouth daily.         . Fluticasone-Salmeterol (ADVAIR) 100-50 MCG/DOSE AEPB   Inhalation   Inhale 1 puff into the lungs 2 (two) times daily.          Marland Kitchen guaiFENesin (MUCINEX) 600 MG 12 hr tablet  Oral   Take 600 mg by mouth 2 (two) times daily as needed for cough or to loosen phlegm.         Marland Kitchen guaiFENesin-codeine 100-10 MG/5ML syrup   Oral   Take 10 mLs by mouth every 4 (four) hours as needed for cough. Patient not taking: Reported on 06/08/2015   180 mL   0   . hydrOXYzine (ATARAX/VISTARIL) 50 MG tablet   Oral   Take 50 mg by mouth at bedtime.         Marland Kitchen ibuprofen (ADVIL,MOTRIN) 800 MG tablet   Oral   Take 800 mg by mouth every 8 (eight) hours as needed for mild pain.         Marland Kitchen LORazepam (ATIVAN) 0.5 MG tablet   Oral   Take 0.5 mg by mouth every 4 (four) hours as needed for anxiety.         Marland Kitchen LORazepam (ATIVAN) 1 MG tablet   Oral   Take 1 mg by mouth 2 (two) times daily.         . meloxicam (MOBIC) 15 MG tablet   Oral   Take 15 mg by mouth daily.         . miconazole (ZEASORB-AF) 2 % powder   Topical   Apply 1 application topically  2 (two) times daily.         . QUEtiapine (SEROQUEL) 25 MG tablet   Oral   Take 25 mg by mouth 4 (four) times daily as needed (for restlessness).         . risperiDONE (RISPERDAL) 1 MG tablet   Oral   Take 1 mg by mouth 2 (two) times daily.           Allergies Penicillins  Family History  Problem Relation Age of Onset  . Diabetes Mother   . Heart attack Father     Social History Social History  Substance Use Topics  . Smoking status: Never Smoker   . Smokeless tobacco: Never Used  . Alcohol Use: No    Review of Systems Constitutional: No fever/chills. No lightheadedness or syncope. Eyes: No visual changes. ENT: No sore throat. Cardiovascular: Denies chest pain, palpitations. Respiratory: Denies shortness of breath.  No cough. Gastrointestinal: No abdominal pain.  No nausea, no vomiting.  No diarrhea.  No constipation. Genitourinary: Negative for dysuria. Musculoskeletal: Negative for back pain. Skin: Negative for rash. Neurological: Negative for headaches, focal weakness or numbness. Psychiatric:Positive depression and suicidal ideations. Positive auditory hallucinations. Negative homicidal ideations. 10-point ROS otherwise negative.  ____________________________________________   PHYSICAL EXAM:  VITAL SIGNS: ED Triage Vitals  Enc Vitals Group     BP 06/16/15 1642 142/104 mmHg     Pulse Rate 06/16/15 1642 71     Resp 06/16/15 1642 18     Temp 06/16/15 1642 98.7 F (37.1 C)     Temp Source 06/16/15 1642 Oral     SpO2 06/16/15 1642 95 %     Weight 06/16/15 1642 207 lb (93.895 kg)     Height 06/16/15 1642  (1.626 m)     Head Cir --      Peak Flow --      Pain Score 06/16/15 1643 10     Pain Loc --      Pain Edu? --      Excl. in GC? --     Constitutional: Alert and oriented. Well appearing and in no acute distress. Answer question appropriately. Patient moves around the  bed in the hallway in no acute distress. Eyes: Conjunctivae are  normal.  EOMI. no scleral icterus. Head: Atraumatic. Nose: No congestion/rhinnorhea. Mouth/Throat: Mucous membranes are moist.  Neck: No stridor.  Supple.   Cardiovascular: Normal rate, regular rhythm. No murmurs, rubs or gallops.  Respiratory: Normal respiratory effort.  No retractions. Lungs CTAB.  No wheezes, rales or ronchi. Gastrointestinal: Obese. Soft and nontender. No distention. No peritoneal signs. Musculoskeletal: No LE edema.  Neurologic:  Normal speech and language. No gross focal neurologic deficits are appreciated.  Skin:  Skin is warm, dry and intact. No rash noted. Psychiatric: Mood is flat and affect is depressed.  ____________________________________________   LABS (all labs ordered are listed, but only abnormal results are displayed)  Labs Reviewed  BASIC METABOLIC PANEL - Abnormal; Notable for the following:    Glucose, Bld 100 (*)    BUN 27 (*)    All other components within normal limits  URINE DRUG SCREEN, QUALITATIVE ([facility] ONLY) - Abnormal; Notable for the following:    Tricyclic, Ur Screen POSITIVE (*)    Benzodiazepine, Ur Scrn POSITIVE (*)    All other components within normal limits  TROPONIN I  CBC  ACETAMINOPHEN LEVEL  ETHANOL  SALICYLATE LEVEL   ____________________________________________  EKG  ED ECG REPORT I, Rockne Menghini, the attending physician, personally viewed and interpreted this ECG.   Date: 06/16/2015  EKG Time: 1644  Rate: 70  Rhythm: normal sinus rhythm  Axis: Normal  Intervals:none  ST&T Change: No ST elevation  ____________________________________________  RADIOLOGY  Dg Chest 2 View  06/16/2015  CLINICAL DATA:  Chest pain for 1 week with nonproductive cough. EXAM: CHEST  2 VIEW COMPARISON:  06/12/2015. FINDINGS: Trachea is midline. Heart size normal. Lungs are clear. No pleural fluid. IMPRESSION: No acute findings. Electronically Signed   By: Leanna Battles M.D.   On: 06/16/2015 17:35     ____________________________________________   PROCEDURES  Procedure(s) performed: None  Critical Care performed: No ____________________________________________   INITIAL IMPRESSION / ASSESSMENT AND PLAN / ED COURSE  Pertinent labs & imaging results that were available during my care of the patient were reviewed by me and considered in my medical decision making (see chart for details).  50 y.o. female with a history of schizoaffective disorder, or personality disorder and depression who is a frequent visitor to the emergency department because she does not like her group home. She is here today because she is reporting auditory hallucinations which are telling her to commit suicide with the plan of jumping in front of a car. I'll plan her medical clearance, and then await psychiatric disposition.  ----------------------------------------- 7:17 PM on 06/16/2015 -----------------------------------------  Me, the patient denies any chest pain. Her EKG is normal and she has a chest x-ray which is reassuring. Her labs are positive for tricyclics and benzodiazepines, but otherwise she has no acute findings. She is medically cleared for psychiatric disposition.  ____________________________________________  FINAL CLINICAL IMPRESSION(S) / ED DIAGNOSES  Final diagnoses:  Auditory hallucinations  Suicidal ideations      NEW MEDICATIONS STARTED DURING THIS VISIT:  New Prescriptions   No medications on file     Rockne Menghini, MD 06/16/15 [phone]

## 2015-06-16 NOTE — BH Assessment (Signed)
Received phone call from [facility] ([phone]), about patient being transferred to the ER. They state, the patient came to their center earlier today voicing SI. [facility] was able to work with her and she longer voiced SI. When they were about to send her back to the Group Home, she stated she was having chest pains. Thus, [facility] had to call EMS to bring her to the ER to get checked out.

## 2015-06-16 NOTE — ED Notes (Signed)
Pt reports centralized chest pain that radiates down left arm, started today. Pt also reports she does not like her group home she's in and wants to talk to someone about that. Pt was seen here 3 days ago for the same group home reason.

## 2015-06-16 NOTE — ED Notes (Signed)
Pt in lobby telling others she's suicidal, MD informed and charge informed. Pt changed into behavioral clothing and belongings locked up in BHU.

## 2015-06-17 ENCOUNTER — Inpatient Hospital Stay
Admission: EM | Admit: 2015-06-17 | Discharge: 2015-06-21 | DRG: 885 | Disposition: A | Discharge status: Home / Self Care | Payer: Medicare Other | Source: Intra-hospital | Attending: Psychiatry | Admitting: Psychiatry

## 2015-06-17 DIAGNOSIS — F603 Borderline personality disorder: Secondary | ICD-10-CM | POA: Diagnosis present

## 2015-06-17 DIAGNOSIS — E785 Hyperlipidemia, unspecified: Secondary | ICD-10-CM | POA: Diagnosis present

## 2015-06-17 DIAGNOSIS — F250 Schizoaffective disorder, bipolar type: Secondary | ICD-10-CM

## 2015-06-17 DIAGNOSIS — F70 Mild intellectual disabilities: Secondary | ICD-10-CM | POA: Diagnosis present

## 2015-06-17 DIAGNOSIS — J449 Chronic obstructive pulmonary disease, unspecified: Secondary | ICD-10-CM | POA: Diagnosis present

## 2015-06-17 DIAGNOSIS — G809 Cerebral palsy, unspecified: Secondary | ICD-10-CM | POA: Diagnosis present

## 2015-06-17 DIAGNOSIS — F17210 Nicotine dependence, cigarettes, uncomplicated: Secondary | ICD-10-CM | POA: Diagnosis present

## 2015-06-17 DIAGNOSIS — G4700 Insomnia, unspecified: Secondary | ICD-10-CM | POA: Diagnosis present

## 2015-06-17 DIAGNOSIS — K219 Gastro-esophageal reflux disease without esophagitis: Secondary | ICD-10-CM | POA: Diagnosis present

## 2015-06-17 DIAGNOSIS — R440 Auditory hallucinations: Secondary | ICD-10-CM | POA: Diagnosis not present

## 2015-06-17 DIAGNOSIS — R45851 Suicidal ideations: Secondary | ICD-10-CM | POA: Diagnosis present

## 2015-06-17 DIAGNOSIS — F259 Schizoaffective disorder, unspecified: Secondary | ICD-10-CM | POA: Diagnosis not present

## 2015-06-17 LAB — LIPID PANEL
Cholesterol: 154 mg/dL (ref 0–200)
HDL: 23 mg/dL — ABNORMAL LOW (ref >40)
LDL Cholesterol: 82 mg/dL (ref 0–99)
Total CHOL/HDL Ratio: 6.7 RATIO
Triglycerides: 244 mg/dL — ABNORMAL HIGH (ref <150)
VLDL: 49 mg/dL — ABNORMAL HIGH (ref 0–40)

## 2015-06-17 LAB — TSH: TSH: 4.553 u[IU]/mL — AB (ref 0.350–4.500)

## 2015-06-17 MED ORDER — DIVALPROEX SODIUM 500 MG PO DR TAB
500.0000 mg | DELAYED_RELEASE_TABLET | Freq: Two times a day (BID) | ORAL | Status: DC
  Administered 2015-06-17 – 2015-06-21 (×8): 500 mg via ORAL
  Filled 2015-06-17 (×8): qty 1

## 2015-06-17 MED ORDER — RISPERIDONE 1 MG PO TABS
1.0000 mg | ORAL_TABLET | Freq: Two times a day (BID) | ORAL | Status: DC
  Administered 2015-06-17: 1 mg via ORAL
  Filled 2015-06-17: qty 1

## 2015-06-17 MED ORDER — MELOXICAM 7.5 MG PO TABS
15.0000 mg | ORAL_TABLET | Freq: Every day | ORAL | Status: DC | PRN
  Administered 2015-06-19: 15 mg via ORAL
  Filled 2015-06-17: qty 2

## 2015-06-17 MED ORDER — LORAZEPAM 1 MG PO TABS
1.0000 mg | ORAL_TABLET | Freq: Two times a day (BID) | ORAL | Status: DC
  Administered 2015-06-17: 1 mg via ORAL
  Filled 2015-06-17: qty 1

## 2015-06-17 MED ORDER — FENOFIBRATE 54 MG PO TABS
54.0000 mg | ORAL_TABLET | Freq: Every day | ORAL | Status: DC
  Filled 2015-06-17: qty 1

## 2015-06-17 MED ORDER — ALUM & MAG HYDROXIDE-SIMETH 200-200-20 MG/5ML PO SUSP: 30.0000 mL | ORAL | Status: DC | PRN

## 2015-06-17 MED ORDER — ESCITALOPRAM OXALATE 10 MG PO TABS
20.0000 mg | ORAL_TABLET | Freq: Every day | ORAL | Status: DC
  Administered 2015-06-18: 20 mg via ORAL
  Filled 2015-06-17: qty 2

## 2015-06-17 MED ORDER — LORAZEPAM 0.5 MG PO TABS: 0.5000 mg | ORAL_TABLET | ORAL | Status: DC | PRN

## 2015-06-17 MED ORDER — DIVALPROEX SODIUM 500 MG PO DR TAB
500.0000 mg | DELAYED_RELEASE_TABLET | Freq: Two times a day (BID) | ORAL | Status: DC
  Administered 2015-06-17: 500 mg via ORAL
  Filled 2015-06-17: qty 1

## 2015-06-17 MED ORDER — DICYCLOMINE HCL 20 MG PO TABS: 20.0000 mg | ORAL_TABLET | Freq: Three times a day (TID) | ORAL | Status: DC | PRN

## 2015-06-17 MED ORDER — ASPIRIN EC 81 MG PO TBEC
81.0000 mg | DELAYED_RELEASE_TABLET | Freq: Every day | ORAL | Status: DC
  Administered 2015-06-17: 81 mg via ORAL
  Filled 2015-06-17: qty 1

## 2015-06-17 MED ORDER — FENOFIBRATE 54 MG PO TABS
54.0000 mg | ORAL_TABLET | Freq: Every day | ORAL | Status: DC
  Administered 2015-06-18 – 2015-06-21 (×4): 54 mg via ORAL
  Filled 2015-06-17 (×4): qty 1

## 2015-06-17 MED ORDER — DICYCLOMINE HCL 20 MG PO TABS
20.0000 mg | ORAL_TABLET | Freq: Three times a day (TID) | ORAL | Status: DC | PRN
  Filled 2015-06-17: qty 1

## 2015-06-17 MED ORDER — ASPIRIN EC 81 MG PO TBEC
81.0000 mg | DELAYED_RELEASE_TABLET | Freq: Every day | ORAL | Status: DC
  Administered 2015-06-18 – 2015-06-21 (×4): 81 mg via ORAL
  Filled 2015-06-17 (×4): qty 1

## 2015-06-17 MED ORDER — ACETAMINOPHEN 325 MG PO TABS: 650.0000 mg | ORAL_TABLET | Freq: Four times a day (QID) | ORAL | Status: DC | PRN

## 2015-06-17 MED ORDER — MELOXICAM 15 MG PO TABS
15.0000 mg | ORAL_TABLET | Freq: Every day | ORAL | Status: DC | PRN
  Filled 2015-06-17: qty 1

## 2015-06-17 MED ORDER — ESCITALOPRAM OXALATE 10 MG PO TABS
20.0000 mg | ORAL_TABLET | Freq: Every day | ORAL | Status: DC
  Administered 2015-06-17: 20 mg via ORAL
  Filled 2015-06-17: qty 2

## 2015-06-17 MED ORDER — RISPERIDONE 1 MG PO TABS
1.0000 mg | ORAL_TABLET | Freq: Two times a day (BID) | ORAL | Status: DC
  Administered 2015-06-17 – 2015-06-18 (×2): 1 mg via ORAL
  Filled 2015-06-17 (×2): qty 1

## 2015-06-17 MED ORDER — MOMETASONE FURO-FORMOTEROL FUM 100-5 MCG/ACT IN AERO: 2.0000 | INHALATION_SPRAY | Freq: Two times a day (BID) | RESPIRATORY_TRACT | Status: DC

## 2015-06-17 MED ORDER — LORAZEPAM 1 MG PO TABS
1.0000 mg | ORAL_TABLET | Freq: Two times a day (BID) | ORAL | Status: DC
  Administered 2015-06-17 – 2015-06-18 (×2): 1 mg via ORAL
  Filled 2015-06-17 (×2): qty 1

## 2015-06-17 MED ORDER — MOMETASONE FURO-FORMOTEROL FUM 100-5 MCG/ACT IN AERO
2.0000 | INHALATION_SPRAY | Freq: Two times a day (BID) | RESPIRATORY_TRACT | Status: DC
  Administered 2015-06-17 – 2015-06-21 (×8): 2 via RESPIRATORY_TRACT
  Filled 2015-06-17: qty 8.8

## 2015-06-17 MED ORDER — MAGNESIUM HYDROXIDE 400 MG/5ML PO SUSP: 30.0000 mL | Freq: Every day | ORAL | Status: DC | PRN

## 2015-06-17 MED ORDER — LORAZEPAM 0.5 MG PO TABS
0.5000 mg | ORAL_TABLET | ORAL | Status: DC | PRN
Start: 2015-06-17 — End: 2015-06-18

## 2015-06-17 NOTE — ED Notes (Signed)
Given lunch

## 2015-06-17 NOTE — ED Notes (Signed)
Pt is alert and oriented on admission. Pt mood is sad and her affect is flat/depressed. Pt endorses passive SI and command auditory hallucinations to self harm, but does contract for safety. Pt requested food and drink and went to sleep immediately after eating. Pt is calm and cooperative with staff. 15 minute checks are ongoing for safety.

## 2015-06-17 NOTE — Consult Note (Signed)
[facility] Face-to-Face Psychiatry Consult   Reason for Consult:  Consult for this 50 year old woman with a history of chronic mental health problems and repeated emergency room visits who is again reporting suicidal ideation. Referring Physician:  Mariea Clonts [name] MRN:  [id number] Principal Diagnosis: Schizoaffective disorder [facility]) Diagnosis:   Patient Active Problem List   Diagnosis Date Noted  . Schizoaffective disorder ([place]) [F25.9] 06/17/2015  . Suicidal behavior [F48.9] 06/09/2015  . Anxiety [F41.9] 12/31/2014  . Cellulitis and abscess of leg [L02.419, L03.119] 12/31/2014  . 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 GERD (gastroesophageal reflux disease) [K21.9] 09/24/2014  . COPD (chronic obstructive pulmonary disease) ([place]) [J44.9] 09/24/2014  . Borderline personality disorder [F60.3] 09/24/2014  . Major depressive disorder, recurrent episode, moderate ([place]) [F33.1] 09/24/2014  . Cerebral palsy ([place]) [G80.9] 08/31/2014    Total Time spent with patient: 1 hour  Subjective:   [name] is a 50 y.o. female patient admitted with "this doesn't have anything to do with my group home".  HPI:  Patient interviewed. Chart reviewed including much of her recent psychiatric history. Labs and vitals reviewed. This 50 year old woman came to the emergency room this time initially reporting she was having chest pain. After she got here her story changed and she started focusing more on having suicidal thoughts. When I spoke to her today she told me that her reason for feeling suicidal is not currently because of her group home but is because she is upset about her mother. She says that her brother left their mother at home by herself for a while. As far as I can tell the mother is actually fairly good about her own self-care and it doesn't sound like that big a deal but the patient is very upset about it. As usual she is reporting having constant thoughts about  hurting herself. Talks about walking out into traffic to get hit by a car. Patient says that her mood is bad. She then goes on to once again reviewed the many ways that she is upset about her group home. She says the neighborhood is bad. She says the staff there treat her badly and make fun of her. She complains that her phone was recently confiscated from her. This was apparently done because she was abusing 911 calls but the patient resents it greatly. Patient indicates that she feels unhappy in general they're like nobody is nice to her and she has nothing to do. She has intermittent psychotic symptoms but is not currently presenting as being obviously delusional. No new physical symptoms no substance abuse. Compliant with medicine.  Social history: Patient apparently is her own guardian. She lives in a group home in town. Her closest relatives are her brother and mother who live in [place]. From what I've been told she has been through a large number of group homes in the area and has ended up getting thrown out of her quitting many of them and is seen as somebody who has "burned her bridges". Patient's insight into this is limited.  Medical history: Patient has a diagnosis of cerebral palsy. She is completely ambulatory on her own however and able to use her arms appropriately. There is nothing about it that impairs her physical function day to day. She does have some cognitive impairments. I'm not aware if we've had formal testing but overall the impression is that she is at the borderline to possibly mild cognitive impairment stage. She is certainly able to have a fluent conversation and  participate in conversation about her symptoms and situation. She has a history of gastric reflux of COPD and high blood pressure.  Substance abuse history: Denies any alcohol or drug abuse no evidence that that's been an issue in the past.  Past Psychiatric History: Multiple hospitalizations over the years. Here  recently the patient has fallen into a habit of very frequently coming to the emergency room. This is her third time in our emergency room in one week. At about the rate it's getting to. As soon as she comes in and we discharge her it's a matter of a couple days before she is coming back. She has outpatient treatment in place but is not going to a day program or involved in any kind of regular therapy, just medication management. Currently on an antidepressant and antipsychotic and a mood stabilizer. She talks a lot about walking out into traffic and killing herself. Sounds like she may have done some things in the past to hurt herself actually. No history of physical violence to others. Variously as schizoaffective or major depression along with personality disorder  Risk to Self: Is patient at risk for suicide?: No Risk to Others:   Prior Inpatient Therapy:   Prior Outpatient Therapy:    Past Medical History:  Past Medical History  Diagnosis Date  . Asthma   . Anxiety   . GERD (gastroesophageal reflux disease)   . Mild cognitive impairment   . Cerebral palsy ([place])   . Borderline personality disorder   . Depression   . Wound abscess   . Schizoaffective disorder [facility])     Past Surgical History  Procedure Laterality Date  . Cholecystectomy    . Tonsillectomy    . Incision and drainage abscess Right 01/02/2015    Procedure: INCISION AND DRAINAGE ABSCESS;  Surgeon: Dia Crawford III, MD;  Location: [facility] ORS;  Service: General;  Laterality: Right;   Family History:  Family History  Problem Relation Age of Onset  . Diabetes Mother   . Heart attack Father    Family Psychiatric  History: Patient is not aware of any family history of mental health problems Social History:  History  Alcohol Use No     History  Drug Use No    Social History   Social History  . Marital Status: Single    Spouse Name: N/A  . Number of Children: N/A  . Years of Education: N/A   Social History Main  Topics  . Smoking status: Never Smoker   . Smokeless tobacco: Never Used  . Alcohol Use: No  . Drug Use: No  . Sexual Activity: Yes    Birth Control/ Protection: Implant   Other Topics Concern  . None   Social History Narrative   Additional Social History:    Allergies:   Allergies  Allergen Reactions  . Penicillins Other (See Comments)    Reaction:  Unknown     Labs:  Results for orders placed or performed during the hospital encounter of 06/16/15 (from the past 48 hour(s))  Basic metabolic panel     Status: Abnormal   Collection Time: 06/16/15  4:44 PM  Result Value Ref Range   Sodium 143 135 - 145 mmol/L   Potassium 3.8 3.5 - 5.1 mmol/L   Chloride 107 101 - 111 mmol/L   CO2 30 22 - 32 mmol/L   Glucose, Bld 100 (H) 65 - 99 mg/dL   BUN 27 (H) 6 - 20 mg/dL   Creatinine, Ser 0.92  0.44 - 1.00 mg/dL   Calcium 9.0 8.9 - 10.3 mg/dL   GFR calc non Af Amer >60 >60 mL/min   GFR calc Af Amer >60 >60 mL/min    Comment: (NOTE) The eGFR has been calculated using the CKD EPI equation. This calculation has not been validated in all clinical situations. eGFR's persistently <60 mL/min signify possible Chronic Kidney Disease.    Anion gap 6 5 - 15  Troponin I     Status: None   Collection Time: 06/16/15  4:44 PM  Result Value Ref Range   Troponin I <0.03 <0.031 ng/mL    Comment:        NO INDICATION OF MYOCARDIAL INJURY.   CBC     Status: None   Collection Time: 06/16/15  4:44 PM  Result Value Ref Range   WBC 7.1 3.6 - 11.0 K/uL   RBC 4.05 3.80 - 5.20 MIL/uL   Hemoglobin 12.4 12.0 - 16.0 g/dL   HCT 37.0 35.0 - 47.0 %   MCV 91.4 80.0 - 100.0 fL   MCH 30.5 26.0 - 34.0 pg   MCHC 33.4 32.0 - 36.0 g/dL   RDW 14.5 11.5 - 14.5 %   Platelets 329 150 - 440 K/uL  Urine Drug Screen, Qualitative ([facility] only)     Status: Abnormal   Collection Time: 06/16/15  6:02 PM  Result Value Ref Range   Tricyclic, Ur Screen POSITIVE (A) NONE DETECTED   Amphetamines, Ur Screen NONE DETECTED  NONE DETECTED   MDMA (Ecstasy)Ur Screen NONE DETECTED NONE DETECTED   Cocaine Metabolite,Ur [place] NONE DETECTED NONE DETECTED   Opiate, Ur Screen NONE DETECTED NONE DETECTED   Phencyclidine (PCP) Ur S NONE DETECTED NONE DETECTED   Cannabinoid 50 Ng, Ur [place] NONE DETECTED NONE DETECTED   Barbiturates, Ur Screen NONE DETECTED NONE DETECTED   Benzodiazepine, Ur Scrn POSITIVE (A) NONE DETECTED   Methadone Scn, Ur NONE DETECTED NONE DETECTED    Comment: (NOTE) 627  Tricyclics, urine               Cutoff 1000 ng/mL 200  Amphetamines, urine             Cutoff 1000 ng/mL 300  MDMA (Ecstasy), urine           Cutoff 500 ng/mL 400  Cocaine Metabolite, urine       Cutoff 300 ng/mL 500  Opiate, urine                   Cutoff 300 ng/mL 600  Phencyclidine (PCP), urine      Cutoff 25 ng/mL 700  Cannabinoid, urine              Cutoff 50 ng/mL 800  Barbiturates, urine             Cutoff 200 ng/mL 900  Benzodiazepine, urine           Cutoff 200 ng/mL 1000 Methadone, urine                Cutoff 300 ng/mL 1100 1200 The urine drug screen provides only a preliminary, unconfirmed 1300 analytical test result and should not be used for non-medical 1400 purposes. Clinical consideration and professional judgment should 1500 be applied to any positive drug screen result due to possible 1600 interfering substances. A more specific alternate chemical method 1700 must be used in order to obtain a confirmed analytical result.  1800 Gas chromato graphy / mass spectrometry (GC/MS) is the preferred  1900 confirmatory method.   Acetaminophen level     Status: Abnormal   Collection Time: 06/16/15  6:44 PM  Result Value Ref Range   Acetaminophen (Tylenol), Serum <10 (L) 10 - 30 ug/mL    Comment:        THERAPEUTIC CONCENTRATIONS VARY SIGNIFICANTLY. A RANGE OF 10-30 ug/mL MAY BE AN EFFECTIVE CONCENTRATION FOR MANY PATIENTS. HOWEVER, SOME ARE BEST TREATED AT CONCENTRATIONS OUTSIDE THIS RANGE. ACETAMINOPHEN  CONCENTRATIONS >150 ug/mL AT 4 HOURS AFTER INGESTION AND >50 ug/mL AT 12 HOURS AFTER INGESTION ARE OFTEN ASSOCIATED WITH TOXIC REACTIONS.   Ethanol (ETOH)     Status: None   Collection Time: 06/16/15  6:44 PM  Result Value Ref Range   Alcohol, Ethyl (B) <5 <5 mg/dL    Comment:        LOWEST DETECTABLE LIMIT FOR SERUM ALCOHOL IS 5 mg/dL FOR MEDICAL PURPOSES ONLY   Salicylate level     Status: None   Collection Time: 06/16/15  6:44 PM  Result Value Ref Range   Salicylate Lvl <5.0 2.8 - 30.0 mg/dL    Current Facility-Administered Medications  Medication Dose Route Frequency Provider Last Rate Last Dose  . aspirin EC tablet 81 mg  81 mg Oral Daily Gonzella Lex, MD      . dicyclomine (BENTYL) tablet 20 mg  20 mg Oral TID PRN Gonzella Lex, MD      . divalproex (DEPAKOTE) DR tablet 500 mg  500 mg Oral Q12H John T Clapacs, MD      . escitalopram (LEXAPRO) tablet 20 mg  20 mg Oral Daily Gonzella Lex, MD      . fenofibrate tablet 54 mg  54 mg Oral Daily John T Clapacs, MD      . LORazepam (ATIVAN) tablet 0.5 mg  0.5 mg Oral Q4H PRN Gonzella Lex, MD      . LORazepam (ATIVAN) tablet 1 mg  1 mg Oral BID Gonzella Lex, MD      . meloxicam (MOBIC) tablet 15 mg  15 mg Oral Daily PRN Gonzella Lex, MD      . mometasone-formoterol (DULERA) 100-5 MCG/ACT inhaler 2 puff  2 puff Inhalation BID Gonzella Lex, MD      . risperiDONE (RISPERDAL) tablet 1 mg  1 mg Oral BID Gonzella Lex, MD       Current Outpatient Prescriptions  Medication Sig Dispense Refill  . acetaminophen (TYLENOL) 500 MG tablet Take 500 mg by mouth every 6 (six) hours as needed for mild pain, fever or headache.     Marland Kitchen aspirin EC 81 MG tablet Take 81 mg by mouth daily.    Marland Kitchen dicyclomine (BENTYL) 20 MG tablet Take 1 tablet (20 mg total) by mouth 3 (three) times daily as needed for spasms. 30 tablet 0  . diphenoxylate-atropine (LOMOTIL) 2.5-0.025 MG per tablet Take 1 tablet by mouth every 6 (six) hours as needed for  diarrhea or loose stools.    . divalproex (DEPAKOTE ER) 250 MG 24 hr tablet Take 500 mg by mouth 2 (two) times daily.    Marland Kitchen escitalopram (LEXAPRO) 20 MG tablet Take 1 tablet (20 mg total) by mouth at bedtime. 30 tablet 0  . fenofibrate 54 MG tablet Take 54 mg by mouth daily.    . Fluticasone-Salmeterol (ADVAIR) 100-50 MCG/DOSE AEPB Inhale 1 puff into the lungs 2 (two) times daily.     Marland Kitchen guaiFENesin (MUCINEX) 600 MG 12 hr tablet Take 600 mg by  mouth 2 (two) times daily as needed for cough or to loosen phlegm.    . hydrOXYzine (ATARAX/VISTARIL) 50 MG tablet Take 50 mg by mouth at bedtime.    Marland Kitchen ibuprofen (ADVIL,MOTRIN) 800 MG tablet Take 800 mg by mouth every 8 (eight) hours as needed for mild pain.    Marland Kitchen LORazepam (ATIVAN) 0.5 MG tablet Take 0.5 mg by mouth every 4 (four) hours as needed for anxiety.    Marland Kitchen LORazepam (ATIVAN) 1 MG tablet Take 1 mg by mouth 2 (two) times daily.    . meloxicam (MOBIC) 15 MG tablet Take 15 mg by mouth daily.    . miconazole (ZEASORB-AF) 2 % powder Apply 1 application topically 2 (two) times daily.    . QUEtiapine (SEROQUEL) 25 MG tablet Take 25 mg by mouth 4 (four) times daily as needed (for restlessness).    . risperiDONE (RISPERDAL) 1 MG tablet Take 1 mg by mouth 2 (two) times daily.    Marland Kitchen guaiFENesin-codeine 100-10 MG/5ML syrup Take 10 mLs by mouth every 4 (four) hours as needed for cough. (Patient not taking: Reported on 06/08/2015) 180 mL 0    Musculoskeletal: Strength & Muscle Tone: within normal limits Gait & Station: normal Patient leans: N/A  Psychiatric Specialty Exam: Review of Systems  Constitutional: Negative.   HENT: Negative.   Eyes: Negative.   Respiratory: Negative.   Cardiovascular: Positive for chest pain.  Gastrointestinal: Positive for heartburn and nausea.  Musculoskeletal: Negative.   Skin: Negative.   Neurological: Negative.   Psychiatric/Behavioral: Positive for depression, suicidal ideas and hallucinations. Negative for memory loss and  substance abuse. The patient is nervous/anxious and has insomnia.     Blood pressure 128/83, pulse 73, temperature 98.5 F (36.9 C), temperature source Oral, resp. rate 20, height _0  (1.626 m), weight 93.895 kg (207 lb), SpO2 99 %.Body mass index is 35.51 kg/(m^2).  General Appearance: Disheveled  Eye Sport and exercise psychologist::  Fair  Speech:  Slow and Slurred  Volume:  Normal  Mood:  Depressed  Affect:  Depressed  Thought Process:  Linear  Orientation:  Full (Time, Place, and Person)  Thought Content:  Hallucinations: Auditory  Suicidal Thoughts:  Yes.  with intent/plan  Homicidal Thoughts:  No  Memory:  Immediate;   Good Recent;   Fair Remote;   Fair  Judgement:  Impaired  Insight:  Shallow  Psychomotor Activity:  Psychomotor Retardation and TD  Concentration:  Fair  Recall:  [place]  Language: Fair  Akathisia:  No  Handed:  Right  AIMS (if indicated):     Assets:  Desire for Improvement Financial Resources/Insurance Social Support  ADL's:  Intact  Cognition: Impaired,  Mild  Sleep:      Treatment Plan Summary: Daily contact with patient to assess and evaluate symptoms and progress in treatment, Medication management and Plan At this point the strategy we have had so far of rapidly discharging her from the emergency room does not seem to be improving the situation. She has escalated her frequency of visits to the emergency room. Patient presents as being desperate for some kind of assistance now. I have weighed the options of appropriate treatment and decided to try admitting her to the hospital since as I said before the alternative has not been improving anything. Patient will be admitted to the psychiatry ward. Continuous observation for now. Continue current medicines including when necessary's for anxiety. Try and engage her in groups and activities on the unit which she is very  enthusiastic about. Social work in primary treatment team can work with her as well as  her Tourist information centre manager about what would be the appropriate and game for her. Patient agrees to the plan.  Disposition: Recommend psychiatric Inpatient admission when medically cleared.  Alethia Berthold, MD 06/17/2015 12:15 PM

## 2015-06-17 NOTE — Plan of Care (Signed)
Problem: Ineffective individual coping Goal: LTG: Patient will report a decrease in negative feelings Outcome: Not Progressing  Patient has a plan  To run out into traffic

## 2015-06-17 NOTE — Tx Team (Signed)
Initial Interdisciplinary Treatment Plan   PATIENT STRESSORS: Health problems Occupational concerns   PATIENT STRENGTHS: Ability for insight Active sense of humor Communication skills General fund of knowledge Supportive family/friends   PROBLEM LIST: Problem List/Patient Goals Date to be addressed Date deferred Reason deferred Estimated date of resolution  Cerebral Palsy 06/17/15      Suicidal Ideation 06/17/15      Depression  06/17/15                                          DISCHARGE CRITERIA:  Ability to meet basic life and health needs Improved stabilization in mood, thinking, and/or behavior Safe-care adequate arrangements made  PRELIMINARY DISCHARGE PLAN: Outpatient therapy Return to previous living arrangement  PATIENT/FAMIILY INVOLVEMENT: This treatment plan has been presented to and reviewed with the patient, [name], and/or family member,  The patient and family have been given the opportunity to ask questions and make suggestions.  [name] A Pavel Gadd 06/17/2015, 5:01 PM

## 2015-06-17 NOTE — ED Notes (Addendum)
Pt. Noted in room. No complaints or concerns voiced. No distress or abnormal behavior noted. Will continue to monitor with security cameras. Q 15 minute rounds continue,pt resting comfortably.

## 2015-06-17 NOTE — Progress Notes (Signed)
Patient is to be admitted to [facility] by Dr. Toni Amend.  Attending Physician will be Dr. Ardyth Harps.   Patient has been assigned to room 303, by [facility] Charge Nurse Gwen.   Intake Paper Work has been signed and placed on patient chart.  ER staff is aware of the admission [facility] ER Sect.; Dr. Scotty Court, ER MD; Amy Patient's Nurse & Nedra Hai Patient Access).   Testicular self exam taught. Cheryl Flash, MS, NCC, LPCA Therapeutic Triage Specialist

## 2015-06-17 NOTE — ED Notes (Signed)
oob to bathroom eating her breakfast

## 2015-06-17 NOTE — Progress Notes (Signed)
D: Patient appears flat. Stated she's here because of suicidal thoughts. States she's still having the suicidal thoughts but is willing to come to a staff member if the thoughts become to intense. She denies HI but states she has voices that tell her to hurt herself. She denies pain.  A: Medication was given with education. Encouragement was provided.  R: Patient was compliant with medication. She has remained calm and cooperative. Safety maintained with 15 min checks.

## 2015-06-17 NOTE — ED Provider Notes (Signed)
Case discussed with psychiatry Dr. Toni Amend, and in light of her multiple and frequent ED evaluations including 11 visits in the month of February, he plans to admit for further evaluation. She is medically stable at this time.  Sharman Cheek, MD 06/17/15 [id number]

## 2015-06-17 NOTE — Plan of Care (Signed)
Problem: Diagnosis: Increased Risk For Suicide Attempt Goal: STG-Patient Will Report Suicidal Feelings to Staff Outcome: Progressing Patient reports SI but contracts for safety.

## 2015-06-17 NOTE — ED Notes (Signed)
Pt. Noted in room. No complaints or concerns voiced. No distress or abnormal behavior noted. Will continue to monitor with security cameras. Q 15 minute rounds continue.,pt aware sh eis being admited to beh med, she is agreeable to plan we are waiting for room assignment

## 2015-06-17 NOTE — ED Notes (Signed)
Pt given lunch

## 2015-06-17 NOTE — ED Provider Notes (Signed)
-----------------------------------------   6:48 AM on 06/17/2015 -----------------------------------------   Blood pressure 108/68, pulse 69, temperature 97.7 F (36.5 C), temperature source Oral, resp. rate 18, height  (1.626 m), weight 207 lb (93.895 kg), SpO2 96 %.  The patient had no acute events since last update.  Calm and cooperative at this time.  Disposition is pending per Psychiatry/Behavioral Medicine team recommendations.     Irean Hong, MD 06/17/15 [phone]

## 2015-06-17 NOTE — ED Notes (Signed)
Adm to beh med

## 2015-06-18 DIAGNOSIS — F259 Schizoaffective disorder, unspecified: Secondary | ICD-10-CM

## 2015-06-18 LAB — HEMOGLOBIN A1C: HEMOGLOBIN A1C: 5.4 % (ref 4.0–6.0)

## 2015-06-18 MED ORDER — RISPERIDONE 1 MG PO TABS
2.0000 mg | ORAL_TABLET | Freq: Two times a day (BID) | ORAL | Status: DC
  Administered 2015-06-18 – 2015-06-21 (×6): 2 mg via ORAL
  Filled 2015-06-18 (×6): qty 2

## 2015-06-18 MED ORDER — CLONAZEPAM 1 MG PO TABS
1.0000 mg | ORAL_TABLET | Freq: Two times a day (BID) | ORAL | Status: DC
  Administered 2015-06-18 – 2015-06-20 (×5): 1 mg via ORAL
  Filled 2015-06-18 (×5): qty 1

## 2015-06-18 MED ORDER — ESCITALOPRAM OXALATE 10 MG PO TABS
30.0000 mg | ORAL_TABLET | Freq: Every day | ORAL | Status: DC
  Administered 2015-06-19 – 2015-06-21 (×3): 30 mg via ORAL
  Filled 2015-06-18 (×3): qty 3

## 2015-06-18 NOTE — Progress Notes (Signed)
Recreation Therapy Notes  INPATIENT RECREATION THERAPY ASSESSMENT  Patient Details Name: [name] MRN: [id number] DOB: Jul 16, 1965 Today's Date: 06/18/2015  Patient Stressors: Friends, Other (Comment) (Group home - staff is stressful and they fuss at her)  Coping Skills:   Talking, Music, Sports, Other (Comment) (Deep breathing, word searches)  Personal Challenges: Anger, Communication, Concentration, Decision-Making, Expressing Yourself, Problem-Solving, Self-Esteem/Confidence, Social Interaction, Stress Management, Time Management  Leisure Interests (2+):  Individual - Other (Comment) (Go to [employer] [facility])  Awareness of Community Resources:  No  Community Resources:     Current Use:    If no, Barriers?:    Patient Strengths:  Too nice, too kind  Patient Identified Areas of Improvement:  Attitude, anger, anxiety  Current Recreation Participation:  Nothing  Patient Goal for Hospitalization:  To be a better person  [place]idence[place]  County of Residence:  [place]   Current SI (including self-harm):  No  Current HI:  No  Consent to Intern Participation: N/A   Jacquelynn CreeGreene,[name], LRT/CTRS 06/18/2015, 2:18 PM

## 2015-06-18 NOTE — Progress Notes (Signed)
Recreation Therapy Notes  Date: 03.03.17 Time: 3:00 pm Location: Craft Room  Group Topic: Problem solving, Teamwork, Communication  Goal Area(s) Addresses:  Patient will effectively work with peer towards shared goal. Patient will identify skills used to make activity successful. Patient will identify benefit of use group skills effectively post d/c.  Behavioral Response: Attentive  Intervention: [employer] Tower  Activity: Patients were given 15 pipe cleaners and instructed to build a free standing tower. After approximately 3-5 minutes, patients were told to put their dominant hand behind their back. After approximately 3-5 minutes, patients were told to stop talking to each other.   Education: LRT educated patients on healthy support systems.  Education Outcome: In group clarification offered  Clinical Observations/Feedback: Patient intermittently worked with team to build tower. Patient did not contribute to group discussion.  Jacquelynn CreeGreene,Delicia Berens M, LRT/CTRS 06/18/2015 4:14 PM

## 2015-06-18 NOTE — [facility] Group Notes (Addendum)
[facility] LCSW Group Therapy  06/18/2015 2:23 PM  Type of Therapy:  Group Therapy  Participation Level:  Active  Participation Quality:  Appropriate and Attentive  Affect:  Anxious  Cognitive:  Alert and Disorganized  Insight:  Limited  Engagement in Therapy:  Improving  Modes of Intervention:  Socialization and Support  Summary of Progress/Problems: Patient attended group and was attentive throughout group session as a video was shown "Happy" by [employer] and [employer] and by [employer] Happy. Patient was able to identify that happiness seems to come from internal rather than external things.    Lulu RidingIngle, Eman Morimoto T, MSW, LCSWA 06/18/2015, 2:23 PM

## 2015-06-18 NOTE — Progress Notes (Signed)
D:  Per pt self inventory pt reports sleeping good, appetite good, energy level low, ability to pay attention good, rates depression at a 10 out of 10, hopelessness at a 10 out of 10, anxiety at a 10 out of 10, endorses SI and obsessive thoughts to "put a pillow over my face and smother myself to death", endorsing AH voices "telling me to hurt myself", goal today: "to find a new group home, if y'all will help me please", pt is anxious, needy frequently at nurses station with requests, needs redirection.    A:  Emotional support provided, Encouraged pt to continue with treatment plan and attend all group activities, q15 min checks maintained for safety.  R:  Pt is receptive, going to groups, pleasant and cooperative with staff and other patients on the unit.

## 2015-06-18 NOTE — [facility] Group Notes (Signed)
[facility] Group Notes:  (Nursing/MHT/Case Management/Adjunct)  Date:  06/18/2015  Time:  1:15 AM  Type of Therapy:  Group Therapy  Participation Level:  Did Not Attend   Summary of Progress/Problems:  [name] 06/18/2015, 1:15 AM

## 2015-06-18 NOTE — [facility] Suicide Risk Assessment (Signed)
[facility]   Nursing information obtained from:    Demographic factors:    Current Mental Status:    Loss Factors:    Historical Factors:    Risk Reduction Factors:     Total Time spent with patient: 1 hour Principal Problem: Schizoaffective disorder (HCC) Diagnosis:   Patient Active Problem List   Diagnosis Date Noted  . Schizoaffective disorder (HCC) [F25.9] 06/17/2015  . 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 GERD (gastroesophageal reflux disease) [K21.9] 09/24/2014  . COPD (chronic obstructive pulmonary disease) (HCC) [J44.9] 09/24/2014  . Borderline personality disorder [F60.3] 09/24/2014  . Cerebral palsy (HCC) [G80.9] 08/31/2014   Subjective Data:   Continued Clinical Symptoms:  Alcohol Use Disorder Identification Test Final Score (AUDIT): 0 The "Alcohol Use Disorders Identification Test", Guidelines for Use in Primary Care, Second Edition.  World Science writerHealth Organization [facility](WHO). Score between 0-7:  no or low risk or alcohol related problems. Score between 8-15:  moderate risk of alcohol related problems. Score between 16-19:  high risk of alcohol related problems. Score 20 or above:  warrants further diagnostic evaluation for alcohol dependence and treatment.   CLINICAL FACTORS:   Severe Anxiety and/or Agitation Personality Disorders:   Cluster B Previous Psychiatric Diagnoses and Treatments     Psychiatric Specialty Exam: ROS                                                          COGNITIVE FEATURES THAT CONTRIBUTE TO RISK:  Closed-mindedness    SUICIDE RISK:   Mild:  Suicidal ideation of limited frequency, intensity, duration, and specificity.  There are no identifiable plans, no associated intent, mild dysphoria and related symptoms, good self-control (both objective and subjective assessment), few other risk factors, and identifiable protective factors, including available and accessible social  support.  [facility] C[facility]  I certify that inpatient services furnished can reasonably be expected to improve the patient's condition.   Jimmy FootmanHernandez-Gonzalez,  Jolly Carlini, MD 06/18/2015, 1:11 PM
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
## 2015-06-18 NOTE — [facility] Group Notes (Signed)
[facility] Group Notes:  (Nursing/MHT/Case Management/Adjunct)  Date:  06/18/2015  Time:  2:44 PM  Type of Therapy:  Psychoeducational Skills  Participation Level:  Minimal  Participation Quality:  Attentive  Affect:  Flat  Cognitive:  Appropriate  Insight:  Appropriate  Engagement in Group:  Supportive  Modes of Intervention:  Discussion and Education  Summary of Progress/Problems:  [name] 06/18/2015, 2:44 PM

## 2015-06-19 DIAGNOSIS — F250 Schizoaffective disorder, bipolar type: Principal | ICD-10-CM

## 2015-06-19 DIAGNOSIS — F603 Borderline personality disorder: Secondary | ICD-10-CM

## 2015-06-19 LAB — VALPROIC ACID LEVEL: VALPROIC ACID LVL: 55 ug/mL (ref 50.0–100.0)

## 2015-06-19 LAB — PROLACTIN: PROLACTIN: 66.8 ng/mL — AB (ref 4.8–23.3)

## 2015-06-19 NOTE — Progress Notes (Signed)
[facility] MD Progress Note  06/19/2015 5:30 PM [name]  MRN:  [id number] Subjective:   The patient is a 50 year old Caucasian female with h/o cerebral palsy,  mild intellectual disability, schizoaffective disorder and borderline personality disorder.admitted due to suicidal ideations with a plan to smother herself or walk in front of the traffic. The patient states the main source of her stress is disliking her current group home. She has been there for the last 5 months. She feels unsafe as the [facility] is located in a bad neighborhood.  During my interview, she was mumbling and difficult to answer questions. She continues to endorse AH, and stated that she started hearing voices about 2-3 days ago. The voices have been telling her different ways of dying such as getting hit by a car. Patient says her mood has been depressed for the last 3 months. She wants to find another group and remains focused o the same.  She is hoping that the social worker here can help her find a new group home. She is also requesting for [place] to make medication changes and finding her a good medication for depression.   Per records patient is being follow-up by Dr. Omelia Blackwater: She is currently prescribed with Depakote 500 mg by mouth twice a day, Lexapro 20 mg a day. Vistaril 50 mg by mouth daily at bedtime, Seroquel 25 mg 4 times a day as needed for restlessness and Risperdal 1 mg by mouth twice a day.  Patient currently does not have a guardian. Per our social worker social services is attempting to obtain guardianship and guardianship hearing has already been scheduled. .  Principal Problem: Schizoaffective disorder (HCC) Diagnosis:   Patient Active Problem List   Diagnosis Date Noted  . Schizoaffective disorder (HCC) [F25.9] 06/17/2015  . 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 GERD (gastroesophageal reflux disease) [K21.9] 09/24/2014  . COPD (chronic obstructive pulmonary disease) (HCC) [J44.9] 09/24/2014   . Borderline personality disorder [F60.3] 09/24/2014  . Cerebral palsy (HCC) [G80.9] 08/31/2014   Total Time spent with patient: 30 minutes  Past Psychiatric History: h/o multiple admissions in the past. Follows Dr Omelia Blackwater in community.   Past Medical History:  Past Medical History  Diagnosis Date  . Asthma   . Anxiety   . GERD (gastroesophageal reflux disease)   . Mild cognitive impairment   . Cerebral palsy (HCC)   . Borderline personality disorder   . Depression   . Wound abscess   . Schizoaffective disorder [facility])     Past Surgical History  Procedure Laterality Date  . Cholecystectomy    . Tonsillectomy    . Incision and drainage abscess Right 01/02/2015    Procedure: INCISION AND DRAINAGE ABSCESS;  Surgeon: Tiney Rouge III, MD;  Location: [facility] ORS;  Service: General;  Laterality: Right;   Family History:  Family History  Problem Relation Age of Onset  . Diabetes Mother   . Heart attack Father    Family Psychiatric  History: per H&P.  Social History:  History  Alcohol Use No     History  Drug Use No    Social History   Social History  . Marital Status: Single    Spouse Name: N/A  . Number of Children: N/A  . Years of Education: N/A   Social History Main Topics  . Smoking status: Never Smoker   . Smokeless tobacco: Never Used  . Alcohol Use: No  . Drug Use: No  . Sexual Activity: Yes    Birth  Control/ Protection: Implant   Other Topics Concern  . None   Social History Narrative   Additional Social History:                         Sleep: Fair  Appetite:  Fair  Current Medications: Current Facility-Administered Medications  Medication Dose Route Frequency Provider Last Rate Last Dose  . acetaminophen (TYLENOL) tablet 650 mg  650 mg Oral Q6H PRN Audery Amel, MD      . alum & mag hydroxide-simeth (MAALOX/MYLANTA) 200-200-20 MG/5ML suspension 30 mL  30 mL Oral Q4H PRN Audery Amel, MD      . aspirin EC tablet 81 mg  81 mg Oral Daily  Audery Amel, MD   81 mg at 06/19/15 0836  . clonazePAM (KLONOPIN) tablet 1 mg  1 mg Oral BID Jimmy Footman, MD   1 mg at 06/19/15 4098  . dicyclomine (BENTYL) tablet 20 mg  20 mg Oral TID PRN Audery Amel, MD      . divalproex (DEPAKOTE) DR tablet 500 mg  500 mg Oral Q12H Audery Amel, MD   500 mg at 06/19/15 0837  . escitalopram (LEXAPRO) tablet 30 mg  30 mg Oral Daily Jimmy Footman, MD   30 mg at 06/19/15 0839  . fenofibrate tablet 54 mg  54 mg Oral Daily Audery Amel, MD   54 mg at 06/19/15 0837  . magnesium hydroxide (MILK OF MAGNESIA) suspension 30 mL  30 mL Oral Daily PRN Audery Amel, MD      . meloxicam (MOBIC) tablet 15 mg  15 mg Oral Daily PRN Audery Amel, MD   15 mg at 06/19/15 0819  . mometasone-formoterol (DULERA) 100-5 MCG/ACT inhaler 2 puff  2 puff Inhalation BID Audery Amel, MD   2 puff at 06/19/15 0817  . risperiDONE (RISPERDAL) tablet 2 mg  2 mg Oral BID Jimmy Footman, MD   2 mg at 06/19/15 1191    Lab Results:  Results for orders placed or performed during the hospital encounter of 06/17/15 (from the past 48 hour(s))  Hemoglobin A1c     Status: None   Collection Time: 06/17/15  7:41 PM  Result Value Ref Range   Hgb A1c MFr Bld 5.4 4.0 - 6.0 %  Lipid panel, fasting     Status: Abnormal   Collection Time: 06/17/15  7:41 PM  Result Value Ref Range   Cholesterol 154 0 - 200 mg/dL   Triglycerides 478 (H) <150 mg/dL   HDL 23 (L) >29 mg/dL   Total CHOL/HDL Ratio 6.7 RATIO   VLDL 49 (H) 0 - 40 mg/dL   LDL Cholesterol 82 0 - 99 mg/dL    Comment:        Total Cholesterol/HDL:CHD Risk Coronary Heart Disease Risk Table                     Men   Women  1/2 Average Risk   3.4   3.3  Average Risk       5.0   4.4  2 X Average Risk   9.6   7.1  3 X Average Risk  23.4   11.0        Use the calculated Patient Ratio above and the CHD Risk Table to determine the patient's CHD Risk.        ATP III CLASSIFICATION (LDL):  <100      mg/dL  Optimal  100-129  mg/dL   Near or Above                    Optimal  130-159  mg/dL   Borderline  [id number]  mg/dL   High  >045>190     mg/dL   Very High   TSH     Status: Abnormal   Collection Time: 06/17/15  7:41 PM  Result Value Ref Range   TSH 4.553 (H) 0.350 - 4.500 uIU/mL  Prolactin     Status: Abnormal   Collection Time: 06/17/15  7:41 PM  Result Value Ref Range   Prolactin 66.8 (H) 4.8 - 23.3 ng/mL    Comment: (NOTE) Performed At: [facility] [address] [place], [place] [id number] Mila HomerHancock William F MD NW:[phone]   Valproic acid level     Status: None   Collection Time: 06/19/15  6:43 AM  Result Value Ref Range   Valproic Acid Lvl 55 50.0 - 100.0 ug/mL    Blood Alcohol level:  Lab Results  Component Value Date   ETH <5 06/16/2015   ETH <5 06/08/2015    Physical Findings: AIMS: Facial and Oral Movements Muscles of Facial Expression: None, normal Lips and Perioral Area: None, normal Jaw: None, normal Tongue: None, normal,Extremity Movements Upper (arms, wrists, hands, fingers): None, normal Lower (legs, knees, ankles, toes): None, normal, Trunk Movements Neck, shoulders, hips: Minimal, Overall Severity Severity of abnormal movements (highest score from questions above): Minimal Incapacitation due to abnormal movements: Minimal Patient's awareness of abnormal movements (rate only patient's report): No Awareness, Dental Status Current problems with teeth and/or dentures?: No Does patient usually wear dentures?: No  CIWA:    COWS:     Musculoskeletal: Strength & Muscle Tone: within normal limits Gait & Station: normal Patient leans: N/A  Psychiatric Specialty Exam: ROS  Blood pressure 107/67, pulse 83, temperature 97.5 F (36.4 C), temperature source Oral, resp. rate 18, height 5\' 4"  (1.626 m), weight 201 lb (91.173 kg), SpO2 99 %.Body mass index is 34.48 kg/(m^2).  General Appearance: Casual and Disheveled  Eye Contact::  Fair  Speech:   Garbled and Slow  Volume:  Decreased  Mood:  Depressed and Dysphoric  Affect:  Constricted and Depressed  Thought Process:  Goal Directed and Logical  Orientation:  Full (Time, Place, and Person)  Thought Content:  Delusions and Hallucinations: Auditory  Suicidal Thoughts:  Yes.  with intent/plan  Homicidal Thoughts:  No  Memory:  Immediate;   Fair  Judgement:  Impaired  Insight:  Lacking  Psychomotor Activity:  Psychomotor Retardation  Concentration:  Fair  Recall:  Fair  Fund of Knowledge:Fair  Language: Fair  Akathisia:  No  Handed:  Right  AIMS (if indicated):     Assets:  Communication Skills Desire for Improvement Social Support  ADL's:  Intact  Cognition: WNL  Sleep:  Number of Hours: 8.75   Treatment Plan Summary: Daily contact with patient to assess and evaluate symptoms and progress in treatment and Medication management     50 year old Caucasian female with history of schizoaffective disorder, mild mental retardation, borderline personality disorder and cerebral palsy presents to our Hospital voicing suicidal ideation in the setting of being unhappy with current placement. Patient has been seen in an emergency department on February 7, February 14, February 22 and February 23 and then again on March 2. Social services is seen them process of obtaining guardianship.   Schizoaffective disorder:  Risperdal from 1 mg twice a  day to 2 mg twice a day as patient is voicing having auditory hallucinations.  For depressive symptoms the patient will be continued on Lexapro  30 mg a day.  For mood stabilization the patient will be continued on Depakote DR 500 mg by mouth twice a day.  Anxiety and insomnia: Patient will be given Klonopin 1 mg by mouth twice a day the plan will be to decrease or taper off this medication.  COPD continue doing their  Dyslipidemia continue phenyl fibroid 54 mg daily  Pain issues continue Mobic 15 mg by mouth daily  IBS continue Bentyl  20 mg when necessary  Precautions every 15 minute checks  Diet regular  Disposition plan to discharge early next week back to her group home  Discharge follow-up: Patient will continue to follow-up with Dr. Omelia Blackwater.  Labs: We'll order a Depakote level tomorrow a.m. I will also order hemoglobin A1c, prolactin level, lipid panel. TSH is elevated at 4.2. I will recheck thyroid function a lot with T4 and T3 tomorrow a.m.  Will continue to monitor.    Brandy Hale, MD 06/19/2015, 5:30 PM

## 2015-06-19 NOTE — Progress Notes (Signed)
D: Observed pt lying in bed sleeping. Patient alert and oriented x4. Patient denies SI/HI/AVH. Pt affect anxious, sad and depressed. Pt did not want to sit up to take medications at first, but eventually was convinced. When asked about situation leading to admission pt stated "I had suicidal thoughts." Pt would not elaborate or go into more details. Pt rated 8/10 and anxiety 8/10. Pt did indicate that she learned "how to cope with anxiety...how to love each other" in group earlier.  A: Offered active listening and support. Provided therapeutic communication. Administered scheduled medications. Encouraged pt to continue attending group and be more physically active on unit. R: Pt pleasant and cooperative. Pt went right back to sleep after writer left. Pt medication compliant. Will continue Q15 min. checks. Safety maintained.

## 2015-06-19 NOTE — Progress Notes (Signed)
Pt has been pleasant and cooperative. Pt's mood and affect continues to be depressed. . Pt still not attending unit activities.  Pt denies SI and A/V hallucinations. Pt has been seclusive to her room. Pt did not complete her self inventory form.            the patient

## 2015-06-19 NOTE — Plan of Care (Signed)
Problem: Alteration in mood Goal: LTG-Patient reports reduction in suicidal thoughts (Patient reports reduction in suicidal thoughts and is able to verbalize a safety plan for whenever patient is feeling suicidal)  Outcome: Progressing Pt denied SI this shift.       

## 2015-06-19 NOTE — [facility] Group Notes (Signed)
  06/19/2015 2:18 PM  Type of Therapy:  Group Therapy  Participation Level:  Did Not Attend  Modes of Intervention:  Activity, Discussion, Education, Socialization and Support  Summary of Progress/Problems: Mindfulness: Patient discussed mindfulness and relaxing techniques and why they are beneficial. Pt discussed ways to incorporate mindfulness in their lives. Pt practiced a mindfulness techique and discussed how it made them feel.  Sean Malinowski L Adriona Kaney  MSW, LCSWA   06/19/2015, 2:18 PM   

## 2015-06-20 DIAGNOSIS — F250 Schizoaffective disorder, bipolar type: Secondary | ICD-10-CM | POA: Insufficient documentation

## 2015-06-20 LAB — THYROID PANEL WITH TSH
Free Thyroxine Index: 2.4 (ref 1.2–4.9)
T3 UPTAKE RATIO: 31 % (ref 24–39)
T4, Total: 7.8 ug/dL (ref 4.5–12.0)
TSH: 3.48 u[IU]/mL (ref 0.450–4.500)

## 2015-06-20 MED ORDER — CLONAZEPAM 0.5 MG PO TABS
0.5000 mg | ORAL_TABLET | Freq: Two times a day (BID) | ORAL | Status: DC
  Administered 2015-06-20 – 2015-06-21 (×3): 0.5 mg via ORAL
  Filled 2015-06-20 (×3): qty 1

## 2015-06-20 NOTE — Progress Notes (Signed)
[facility]  06/20/2015 9:54 AM Tempie DonningKimberly Buchberger  MRN:  [id number] Subjective:   The patient is a 50 year old Caucasian female with h/o cerebral palsy,  mild intellectual disability, schizoaffective disorder and borderline personality disorder admitted due to suicidal ideations with a plan to  walk in front of the traffic and take pills. The patient states the main source of her stress is disliking her current group home and remains focused  another group home  She has been there for the last 5 months.They pick on her and laugh on her.  [facility].She stated that police took her phone and she does not want to go back there. Appears restless and was taking about her placement.    Per records patient is being follow-up by Dr. Omelia BlackwaterHeaden: She is currently prescribed with Depakote 500 mg by mouth twice a day, Lexapro 20 mg a day. Vistaril 50 mg by mouth daily at bedtime, Seroquel 25 mg 4 times a day as needed for restlessness and Risperdal 1 mg by mouth twice a day.  Patient currently does not have a guardian. Per our social worker social services is attempting to obtain guardianship and guardianship hearing has already been scheduled. .  Principal Problem: Schizoaffective disorder (HCC) Diagnosis:   Patient Active Problem List   Diagnosis Date Noted  . Schizoaffective disorder (HCC) [F25.9] 06/17/2015  . 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 GERD (gastroesophageal reflux disease) [K21.9] 09/24/2014  . COPD (chronic obstructive pulmonary disease) (HCC) [J44.9] 09/24/2014  . Borderline personality disorder [F60.3] 09/24/2014  . Cerebral palsy (HCC) [G80.9] 08/31/2014   Total Time spent with patient: 30 minutes  Past Psychiatric History: h/o multiple admissions in the past. Follows Dr Omelia BlackwaterHeaden in community.   Past Medical History:  Past Medical History  Diagnosis Date  . Asthma   . Anxiety   . GERD (gastroesophageal reflux  disease)   . Mild cognitive impairment   . Cerebral palsy (HCC)   . Borderline personality disorder   . Depression   . Wound abscess   . Schizoaffective disorder [facility](HCC)     Past Surgical History  Procedure Laterality Date  . Cholecystectomy    . Tonsillectomy    . Incision and drainage abscess Right 01/02/2015    Procedure: INCISION AND DRAINAGE ABSCESS;  Surgeon: Tiney Rougealph Ely III, MD;  Location: [facility] ORS;  Service: General;  Laterality: Right;   Family History:  Family History  Problem Relation Age of Onset  . Diabetes Mother   . Heart attack Father    Family Psychiatric  History: per H&P.  Social History:  History  Alcohol Use No     History  Drug Use No    Social History   Social History  . Marital Status: Single    Spouse Name: N/A  . Number of Children: N/A  . Years of Education: N/A   Social History Main Topics  . Smoking status: Never Smoker   . Smokeless tobacco: Never Used  . Alcohol Use: No  . Drug Use: No  . Sexual Activity: Yes    Birth Control/ Protection: Implant   Other Topics Concern  . None   Social History Narrative   Additional Social History:                         Sleep: Fair  Appetite:  Fair  Current Medications: Current Facility-Administered Medications  Medication Dose Route Frequency  Provider Last Rate Last Dose  . acetaminophen (TYLENOL) tablet 650 mg  650 mg Oral Q6H PRN Audery Amel, MD      . alum & mag hydroxide-simeth (MAALOX/MYLANTA) 200-200-20 MG/5ML suspension 30 mL  30 mL Oral Q4H PRN Audery Amel, MD      . aspirin EC tablet 81 mg  81 mg Oral Daily Audery Amel, MD   81 mg at 06/20/15 0825  . clonazePAM (KLONOPIN) tablet 1 mg  1 mg Oral BID Jimmy Footman, MD   1 mg at 06/20/15 0825  . dicyclomine (BENTYL) tablet 20 mg  20 mg Oral TID PRN Audery Amel, MD      . divalproex (DEPAKOTE) DR tablet 500 mg  500 mg Oral Q12H Audery Amel, MD   500 mg at 06/20/15 0824  . escitalopram (LEXAPRO)  tablet 30 mg  30 mg Oral Daily Jimmy Footman, MD   30 mg at 06/20/15 0825  . fenofibrate tablet 54 mg  54 mg Oral Daily Audery Amel, MD   54 mg at 06/20/15 0824  . magnesium hydroxide (MILK OF MAGNESIA) suspension 30 mL  30 mL Oral Daily PRN Audery Amel, MD      . meloxicam (MOBIC) tablet 15 mg  15 mg Oral Daily PRN Audery Amel, MD   15 mg at 06/19/15 0819  . mometasone-formoterol (DULERA) 100-5 MCG/ACT inhaler 2 puff  2 puff Inhalation BID Audery Amel, MD   2 puff at 06/20/15 [phone]  . risperiDONE (RISPERDAL) tablet 2 mg  2 mg Oral BID Jimmy Footman, MD   2 mg at 06/20/15 9604    Lab Results:  Results for orders placed or performed during the hospital encounter of 06/17/15 (from the past 48 hour(s))  Valproic acid level     Status: None   Collection Time: 06/19/15  6:43 AM  Result Value Ref Range   Valproic Acid Lvl 55 50.0 - 100.0 ug/mL    Blood Alcohol level:  Lab Results  Component Value Date   ETH <5 06/16/2015   ETH <5 06/08/2015    Physical Findings: AIMS: Facial and Oral Movements Muscles of Facial Expression: None, normal Lips and Perioral Area: None, normal Jaw: None, normal Tongue: None, normal,Extremity Movements Upper (arms, wrists, hands, fingers): None, normal Lower (legs, knees, ankles, toes): None, normal, Trunk Movements Neck, shoulders, hips: Minimal, Overall Severity Severity of abnormal movements (highest score from questions above): Minimal Incapacitation due to abnormal movements: Minimal Patient's awareness of abnormal movements (rate only patient's report): No Awareness, Dental Status Current problems with teeth and/or dentures?: No Does patient usually wear dentures?: No  CIWA:    COWS:     Musculoskeletal: Strength & Muscle Tone: within normal limits Gait & Station: normal Patient leans: N/A  Psychiatric Specialty Exam: Review of Systems  Psychiatric/Behavioral: Positive for depression and suicidal ideas.     Blood pressure 112/73, pulse 68, temperature 98 F (36.7 C), temperature source Oral, resp. rate 18, height  (1.626 m), weight 201 lb (91.173 kg), SpO2 99 %.Body mass index is 34.48 kg/(m^2).  General Appearance: Casual and Disheveled  Eye Contact::  Fair  Speech:  Garbled and Slow  Volume:  Decreased  Mood:  Depressed and Dysphoric  Affect:  Constricted and Depressed  Thought Process:  Disorganized  Orientation:  Full (Time, Place, and Person)  Thought Content:  Delusions and Hallucinations: Auditory  Suicidal Thoughts:  Yes.  with intent/plan  Homicidal Thoughts:  No  Memory:  Immediate;   Fair  Judgement:  Impaired  Insight:  Lacking  Psychomotor Activity:  Psychomotor Retardation  Concentration:  Fair  Recall:  [employer] of Knowledge:Fair  Language: Fair  Akathisia:  No  Handed:  Right  AIMS (if indicated):     Assets:  Communication Skills Desire for Improvement Social Support  ADL's:  Intact  Cognition: WNL  Sleep:  Number of Hours: 8   Treatment Plan Summary: Daily contact with patient to assess and evaluate symptoms and progress in treatment and Medication management     50 year old Caucasian female with history of schizoaffective disorder, mild mental retardation, borderline personality disorder and cerebral palsy presents to our Hospital voicing suicidal ideation in the setting of being unhappy with current placement. Patient has been seen in an emergency department on February 7, February 14, February 22 and February 23 and then again on March 2. Social services is seen them process of obtaining guardianship.   Schizoaffective disorder:  Risperdal from 1 mg twice a day to 2 mg twice a day as patient is voicing having auditory hallucinations.  For depressive symptoms the patient will be continued on Lexapro  30 mg a day.  For mood stabilization the patient will be continued on Depakote DR 500 mg by mouth twice a day.  Anxiety and insomnia: Patient will  be given Klonopin 0.5 mg by mouth twice a day the plan will be to decrease or taper off this medication.  COPD continue doing their  Dyslipidemia continue phenyl fibroid 54 mg daily  Pain issues continue Mobic 15 mg by mouth daily  IBS continue Bentyl 20 mg when necessary  Precautions every 15 minute checks  Diet regular  Disposition plan to discharge early next week back to her group home  Discharge follow-up: Patient will continue to follow-up with Dr. Omelia Blackwater.    Will continue to monitor.    Brandy Hale, MD 06/20/2015, 9:54 AM

## 2015-06-20 NOTE — Progress Notes (Signed)
Pt has been pleasant and cooperative. Pt's mood and affect continues to be depressed. . Pt still not attending unit activities. Pt needs frequent redirecting . Pt denies SI and A/V hallucinations. Pt has been seclusive to her room. Pt did not complete her self inventory form.

## 2015-06-20 NOTE — [facility] Group Notes (Signed)
[facility] Group Notes:  (Nursing/MHT/Case Management/Adjunct)  Date:  06/20/2015  Time:  9:13 AM  Type of Therapy:  Community Meeting   Participation Level:  Minimal  Participation Quality:  Attentive and Monopolizing  Affect:  Angry and Defensive  Cognitive:  Alert and Appropriate  Insight:  Lacking  Engagement in Group:  Limited  Modes of Intervention:  Discussion and Education  Summary of Progress/Problems:  Jyasia Markoff [name] Donnamarie Shankles 06/20/2015, 9:13 AM

## 2015-06-20 NOTE — [facility] Group Notes (Addendum)
[facility] LCSW Group Therapy  06/20/2015 3:07 PM  Type of Therapy:  Group Therapy  Participation Level:  Minimal  Participation Quality:  Attentive  Affect:  Flat  Cognitive:  Alert  Insight:  Limited  Engagement in Therapy:  Limited  Modes of Intervention:  Discussion, Education, Socialization and Support  Summary of Progress/Problems: Self esteem: Patients discussed self esteem and how it impacts them. They discussed what aspects in their lives has influenced their self esteem. They were challenged to identify changes that are needed in order to improve self esteem. Pt attended group and stayed the entire time. She sat quietly and listened to other group members.    Rosary Filosa L Paton Crum MSW, LCSWA  06/20/2015, 3:07 PM

## 2015-06-20 NOTE — Plan of Care (Signed)
Problem: Diagnosis: Increased Risk For Suicide Attempt Goal: STG-Patient Will Comply With Medication Regime Outcome: Progressing Patient has been compliant with medication during this shift.      

## 2015-06-20 NOTE — Progress Notes (Signed)
D: Patient appears flat. Minimal interaction with staff. Currently denies SI/HI/AVH for me. Isolates to room. She denies pain. Mumbled during assessment and difficult to understand.  A: Medication was given with education. Encouragement was provided.  R: Patient was compliant with medication. She has remained calm and cooperative. Safety maintained with 15 min checks.

## 2015-06-21 MED ORDER — RISPERIDONE 4 MG PO TABS: 2.0000 mg | ORAL_TABLET | Freq: Every day | ORAL | Status: DC

## 2015-06-21 MED ORDER — RISPERIDONE 4 MG PO TABS: 4.0000 mg | ORAL_TABLET | Freq: Every day | ORAL | Status: DC

## 2015-06-21 MED ORDER — ESCITALOPRAM OXALATE 10 MG PO TABS: 30.0000 mg | ORAL_TABLET | Freq: Every day | ORAL | Status: DC

## 2015-06-21 MED ORDER — RISPERIDONE 2 MG PO TABS: 2.0000 mg | ORAL_TABLET | Freq: Two times a day (BID) | ORAL | Status: DC

## 2015-06-21 NOTE — [facility] Group Notes (Signed)
[facility] Group Notes:  (Nursing/MHT/Case Management/Adjunct)  Date:  06/21/2015  Time:  12:25 PM  Type of Therapy:  Group Therapy  Participation Level:  Active  Participation Quality:  Appropriate  Affect:  Appropriate  Cognitive:  Appropriate  Insight:  Good  Engagement in Group:  Supportive  Modes of Intervention:  Support  Summary of Progress/Problems:  Sherri NeerJackie L [name] 06/21/2015, 12:25 PM

## 2015-06-21 NOTE — B[facility] Suicide Risk Assessment (Signed)
[facility]   Principal Problem: Schizoaffective disorder [facility](HCC) Discharge Diagnoses:  Patient Active Problem List   Diagnosis Date Noted  . Schizoaffective disorder, bipolar type (HCC) [F25.0]   . Schizoaffective disorder (HCC) [F25.9] 06/17/2015  . 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 GERD (gastroesophageal reflux disease) [K21.9] 09/24/2014  . COPD (chronic obstructive pulmonary disease) (HCC) [J44.9] 09/24/2014  . Borderline personality disorder [F60.3] 09/24/2014  . Cerebral palsy (HCC) [G80.9] 08/31/2014      Psychiatric Specialty Exam: ROS                                                         Mental Status Per Nursing Assessment::   On Admission:     Demographic Factors:  Caucasian  Loss Factors: unhappines with current placement  Historical Factors: Impulsivity  Risk Reduction Factors:   Living with another person, especially a relative and Positive social support  Continued Clinical Symptoms:  Previous Psychiatric Diagnoses and Treatments Medical Diagnoses and Treatments/Surgeries  Cognitive Features That Contribute To Risk:  Closed-mindedness    Suicide Risk:  Minimal: No identifiable suicidal ideation.  Patients presenting with no risk factors but with morbid ruminations; may be classified as minimal risk based on the severity of the depressive symptoms   Jimmy FootmanHernandez-Gonzalez,  Keelie Zemanek, MD 06/21/2015, 9:12 AM

## 2015-06-21 NOTE — Progress Notes (Signed)
D: Observed pt in room lying in bed. Patient alert and oriented x4. Patient endorses SI with plan to "get hit by a car or take pills." Pt verbally contracts for safety. Pt endorses auditory hallucinations tell her to "get hit by a car. Pt denies HI/VH. Pt affect is anxious, sad and depressed. Pt stated her day was "terrible" because of her interaction with the Child psychotherapistsocial worker. Pt very preoccupied with Clinical research associatewriter getting number for case worker tonight. Pt rated depression 10/10 and anxiety 10/10. Pt continues to talk about now wanting to go back to her current group home. A: Offered active listening and support. Provided therapeutic communication. Administered scheduled medications. Discussed the importance of reaching out to staff if feeling suicidal. Encouraged pt to attend group and actively participate in care.  R: Pt pleasant and cooperative. Pt medication compliant. Will continue Q15 min. checks. Safety maintained.

## 2015-06-21 NOTE — Progress Notes (Signed)
Recreation Therapy Notes  INPATIENT RECREATION TR PLAN  Patient Details Name: Korrin Waterfield MRN: [id number] DOB: 1965/11/02 Today's Date: 06/21/2015  Rec Therapy Plan Is patient appropriate for Therapeutic Recreation?: Yes Treatment times per week: At least once a week TR Treatment/Interventions: 1:1 session, Group participation (Comment) (Appropriate participation in daily recreation therapy tx)  Discharge Criteria Pt will be discharged from therapy if:: Treatment goals are met, Discharged Treatment plan/goals/alternatives discussed and agreed upon by:: Patient/family  Discharge Summary Short term goals set: See Care Plan Short term goals met: Complete Progress toward goals comments: One-to-one attended Which groups?: Social skills One-to-one attended: Self-esteem, stress management Reason goals not met: N/A Therapeutic equipment acquired: None Reason patient discharged from therapy: Discharge from hospital Pt/family agrees with progress & goals achieved: Yes Date patient discharged from therapy: 06/21/15   Leonette Monarch, LRT/CTRS 06/21/2015, 4:54 PM

## 2015-06-21 NOTE — BHH Suicide Risk Assessment (Signed)
BHH INPATIENT:  Family/Significant Other Suicide Prevention Education  Suicide Prevention Education:  Patient Refusal for Family/Significant Other Suicide Prevention Education: The patient [name] has refused to provide written consent for family/significant other to be provided Family/Significant Other Suicide Prevention Education during admission and/or prior to discharge.  Physician notified.  CSW completed SPE with pt.  Dorothe PeaJonathan F Shelitha Magley 06/21/2015, 12:25 PM

## 2015-06-21 NOTE — Progress Notes (Addendum)
  [facility] :  Will you be returning to the same living situation after discharge:  Yes,  [place] with [facility] group [facility] At discharge, do you have transportation home?: Yes,  pt will be picked up by her group home Do you have the ability to pay for your medications: Yes,  pt will be provided with prescriptions at discharge  Release of information consent forms completed and in the chart;  Patient's signature needed at discharge.  Patient to Follow up at: Follow-up Information    Follow up with [facility].   Why:  Please call Leeroy ChaJessica Jeremiah LCSW to schedule your hospital follow up and assessment for DBT therapy   Contact information:   [address] [place], [place] [address] Ph: [phone]       [facility] with [facility].   Why:  [facility] between the hours of 9-5pm to seek re-admittance for long-term residential care in a group setting   Contact information:   [address] [place], [place] [address] Ph: [phone] Fax: [phone]      [facility]   Why:  Please call [facility] Omelia [facility]waterHeaden at [facility] to schedule your hospital follow up for medication managment and therapy with Dr. Elvera BickerHeaden   Contact information:   [address] [place], [place] [address] [phone] Fax: [phone]      Next level of care provider has access to [facility] Link:no  Safety Planning and Suicide Prevention discussed: Yes,  completed with pt  Have you used any form of tobacco in the last 30 days? (Cigarettes, Smokeless Tobacco, Cigars, and/or Pipes): No  Has patient been referred to the Quitline?: N/A patient is not a smoker  Patient has been referred for addiction treatment: N/A  Mercy RidingJonathan F Seraphina Mitchner 06/21/2015, 3:31 PM

## 2015-06-21 NOTE — BHH Counselor (Signed)
Adult Comprehensive Assessment  Patient ID: [name], female   DOB: Aug 31, 1965, 50 y.o.   MRN: [id number]  Information Source: Information source: Patient  Current Stressors:  Educational / Learning stressors: None reported  Employment / Job issues: Pt receieves SSDI  Family Relationships: None reported  Surveyor, quantityinancial / Lack of resources (include bankruptcy): Limited income  Housing / Lack of housing: Pt lives in a group home but continously requests new group home.   Physical health (include injuries & life threatening diseases): None reported  Social relationships: None reported  Substance abuse: None reported  Bereavement / Loss: None reported   Living/Environment/Situation:  Living Arrangements: Group Home ([name] home ) Living conditions (as described by patient or guardian): Pt reports people are not nice to her at the group home and that she does not like living with 11 other people.  How long has patient lived in current situation?: 5 months  What is atmosphere in current home: Dangerous ("Its a bad neighborhood." )  Family History:  Marital status: Single Are you sexually active?: Yes What is your sexual orientation?: Heterosexual  Has your sexual activity been affected by drugs, alcohol, medication, or emotional stress?: None reported  Does patient have children?: No  Childhood History:  By whom was/is the patient raised?: Both parents Description of patient's relationship with caregiver when they were a child: Good relationship with parents  Patient's description of current relationship with people who raised him/her: Good relationship with parents  How were you disciplined when you got in trouble as a child/adolescent?: None reported  Does patient have siblings?: Yes Number of Siblings: 1 Description of patient's current relationship with siblings: 1 brother- "he doesn't want me to be at this group home."  Did patient suffer any  verbal/emotional/physical/sexual abuse as a child?: No Did patient suffer from severe childhood neglect?: No Has patient ever been sexually abused/assaulted/raped as an adolescent or adult?: No Was the patient ever a victim of a crime or a disaster?: No Witnessed domestic violence?: No Has patient been effected by domestic violence as an adult?: No  Education:  Highest grade of school patient has completed: 10th Grade Currently a student?: No Learning disability?: Yes What learning problems does patient have?: Pt has a MR diagnosis.   Employment/Work Situation:   Employment situation: On disability Why is patient on disability: MR diagnosis How long has patient been on disability: Entire life.  Patient's job has been impacted by current illness: No What is the longest time patient has a held a job?: NA Where was the patient employed at that time?: NA Has patient ever been in the [employer] and Companymilitary?: No  Financial Resources:   Surveyor, quantityinancial resources: [employer], [employer], Insurance claims handlereceives SSDI Does patient have a Lawyerrepresentative payee or guardian?: No (Group home and regular psychatrist have petitioned for guardianship. )  Alcohol/Substance Abuse:   What has been your use of drugs/alcohol within the last 12 months?: None reported  Alcohol/Substance Abuse Treatment Hx: Denies past history Has alcohol/substance abuse ever caused legal problems?: No  Social Support System:   Conservation officer, natureatient's Community Support System: Good Describe Community Support System: Group home, day program, friends  Type of faith/religion: NA How does patient's faith help to cope with current illness?: NA   Leisure/Recreation:   Leisure and Hobbies: walking around, TV showa  Strengths/Needs:   What things does the patient do well?: Friendly.  In what areas does patient struggle / problems for patient: pt does not want to return to group home.  Discharge Plan:   Does patient have access to transportation?: Yes Will patient be  returning to same living situation after discharge?: Yes Currently receiving community mental health services: Yes (From Whom) (Dr Nolon Lennert at [facility]) Does patient have financial barriers related to discharge medications?: No  Summary/Recommendations:    Patient is a 50 year old female admitted  with a diagnosis of Schizoaffective disorder. Patient presented to the hospital with SI and depression. Patient reports primary triggers for admission were current living environment. Pt is constantly in the ED for chest pains and SI. She has never attempted suicide in the past. Her plan is to run in front of a car. When in the ED, she always requests a new group home. She has been to many group homes in [place] and [place]. She calls other group homes and 911 constantly. The police have confiscated her phone. She has been kicked out of [facility] due to constant threats of suicide. She receives outpatient services at [facility]. Her group home is currently petitioning for guardianship. Patient will benefit from crisis stabilization, medication evaluation, group therapy and psycho education in addition to case management for discharge. At discharge, it is recommended that patient remain compliant with established discharge plan and continued treatment.   [name] L [name]. MSW, LCSWA  06/21/2015
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
## 2015-06-21 NOTE — Progress Notes (Signed)
D/C instructions/transition record/suicide risk assessmenr/meds/follow-up appointments/FL2 reviewed, pt verbalized understanding, pt's belongings returned to pt, pt still endorses SI and AH, MD made aware, per MD pt still to be discharged to group home, walked pt out to visitor entrance and pt was picked up by [facility] for transport back to group home.

## 2015-06-21 NOTE — Plan of Care (Signed)
Problem: Alteration in mood Goal: LTG-Patient reports reduction in suicidal thoughts (Patient reports reduction in suicidal thoughts and is able to verbalize a safety plan for whenever patient is feeling suicidal)  Outcome: Not Progressing Pt endorses passive SI with a plan to "get hit by a car or take pills." Pt contracts for safety.
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
## 2015-06-21 NOTE — [facility] Group Notes (Signed)
[facility] Group Notes:  (Nursing/MHT/Case Management/Adjunct)  Date:  06/21/2015  Time:  1:52 AM  Type of Therapy:  Group Therapy  Participation Level:  Minimal  Participation Quality:  Attentive  Affect:  Flat  Cognitive:  Alert  Insight:  Limited  Engagement in Group:  Limited  Modes of Intervention:  n/a  Summary of Progress/Problems:  [name] [name] 06/21/2015, 1:52 AM

## 2015-06-21 NOTE — [place] FL2 (Signed)
MEDICAID FL2 LEVEL OF CARE SCREENING TOOL     IDENTIFICATION  Patient Name: [name] Start Birthdate: September 24, 1965 Sex: female Admission Date (Current Location): 06/17/2015  [place]:  [place] [id number] [facility] Facility and Address:  [facility], [address], [place], [place] [address]      Provider Number: [id number]  Attending Physician Name and Address:  Barnabas HarriesAndrea Hernandez-Gonzale*  Relative Name and Phone Number:  N/A    Current Level of Care: Hospital Recommended Level of Care: Other (Comment) (Group Home) Prior Approval Number:    Date Approved/Denied:   PASRR Number:    Discharge Plan: Other (Comment) (Group Home)    Current Diagnoses: Patient Active Problem List   Diagnosis Date Noted  . Schizoaffective disorder, bipolar type (HCC)   . Schizoaffective disorder (HCC) 06/17/2015  . 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 GERD (gastroesophageal reflux disease) 09/24/2014  . COPD (chronic obstructive pulmonary disease) (HCC) 09/24/2014  . Borderline personality disorder 09/24/2014  . Cerebral palsy (HCC) 08/31/2014    Orientation RESPIRATION BLADDER Height & Weight     Self, Time, Place  Normal Continent Weight: 201 lb (91.173 kg) Height:  5\' 4"  (162.6 cm)  BEHAVIORAL SYMPTOMS/MOOD NEUROLOGICAL BOWEL NUTRITION STATUS  Other (Comment) (Makes calls to other group homes)   Continent    AMBULATORY STATUS COMMUNICATION OF NEEDS Skin   Independent Verbally Normal                       Personal Care Assistance Level of Assistance              Functional Limitations Info             SPECIAL CARE FACTORS FREQUENCY                       Contractures Contractures Info: Not present    Additional Factors Info                  Current Medications (06/21/2015):  This is the current hospital active medication list Current Facility-Administered Medications  Medication Dose Route  Frequency Provider Last Rate Last Dose  . acetaminophen (TYLENOL) tablet 650 mg  650 mg Oral Q6H PRN Audery AmelJohn T Clapacs, MD      . alum & mag hydroxide-simeth (MAALOX/MYLANTA) 200-200-20 MG/5ML suspension 30 mL  30 mL Oral Q4H PRN Audery AmelJohn T Clapacs, MD      . aspirin EC tablet 81 mg  81 mg Oral Daily Audery AmelJohn T Clapacs, MD   81 mg at 06/21/15 0851  . clonazePAM (KLONOPIN) tablet 0.5 mg  0.5 mg Oral BID Brandy HaleUzma Faheem, MD   0.5 mg at 06/21/15 0852  . dicyclomine (BENTYL) tablet 20 mg  20 mg Oral TID PRN Audery AmelJohn T Clapacs, MD      . divalproex (DEPAKOTE) DR tablet 500 mg  500 mg Oral Q12H Audery AmelJohn T Clapacs, MD   500 mg at 06/21/15 0850  . escitalopram (LEXAPRO) tablet 30 mg  30 mg Oral Daily Jimmy FootmanAndrea Hernandez-Gonzalez, MD   30 mg at 06/21/15 0851  . fenofibrate tablet 54 mg  54 mg Oral Daily Audery AmelJohn T Clapacs, MD   54 mg at 06/21/15 0852  . magnesium hydroxide (MILK OF MAGNESIA) suspension 30 mL  30 mL Oral Daily PRN Audery AmelJohn T Clapacs, MD      . meloxicam (MOBIC) tablet 15 mg  15 mg Oral Daily PRN Audery AmelJohn T Clapacs, MD   15 mg  at 06/19/15 0819  . mometasone-formoterol (DULERA) 100-5 MCG/ACT inhaler 2 puff  2 puff Inhalation BID Audery Amel, MD   2 puff at 06/21/15 0850  . risperiDONE (RISPERDAL) tablet 2 mg  2 mg Oral BID Jimmy Footman, MD   2 mg at 06/21/15 1610     Discharge Medications: Please see discharge summary for a list of discharge medications.  Relevant Imaging Results:  Relevant Lab Results:   Additional Information    Dorothe Pea Eunique Balik, LCSW

## 2015-06-21 NOTE — Plan of Care (Signed)
Problem: [facility] Participation in Recreation Therapeutic Interventions Goal: STG-Patient will demonstrate improved self esteem by identif STG: Self-Esteem - Within 3 treatment sessions, patient will verbalize at least 5 positive affirmation statements in one treatment session to increase self-esteem post d/c.  Outcome: Completed/Met Date Met:  06/21/15 Treatment Session 1; Completed 1 out of 1: At approximately 1:00 pm, LRT met with patient in patient room. Patient verbalized 5 positive affirmation statements. Patient reported it felt "good". LRT encouraged patient to continue saying positive affirmation statements. Intervention Used: I Am statements  Leonette Monarch, LRT/CTRS 03.06.17 1:46 pm Goal: STG-Other Recreation Therapy Goal (Specify) STG: Stress Management - Within 3 treatment sessions, patient will verbalize understanding of the stress management techniques in one treatment session to increase stress management skills post d/c.  Outcome: Completed/Met Date Met:  06/21/15 Treatment Session 1; Completed 1 out of 1: At approximately 1:00 pm, LRT met with patient in patient room. LRT educated and provided patient with handouts on stress management techniques. Patient verbalized understanding. LRT encouraged patient to read over and practice the stress management techniques. Intervention Used: I Am statements  Leonette Monarch, LRT/CTRS 03.06.17 1:47 pm

## 2015-07-05 ENCOUNTER — Other Ambulatory Visit: Payer: Self-pay | Admitting: Internal Medicine

## 2015-07-05 DIAGNOSIS — Z1231 Encounter for screening mammogram for malignant neoplasm of breast: Secondary | ICD-10-CM

## 2015-07-07 ENCOUNTER — Emergency Department
Admission: EM | Admit: 2015-07-07 | Discharge: 2015-07-07 | Disposition: A | Discharge status: Home / Self Care | Payer: Medicare Other | Attending: Emergency Medicine | Admitting: Emergency Medicine

## 2015-07-07 DIAGNOSIS — F250 Schizoaffective disorder, bipolar type: Secondary | ICD-10-CM | POA: Insufficient documentation

## 2015-07-07 DIAGNOSIS — R45851 Suicidal ideations: Secondary | ICD-10-CM | POA: Diagnosis present

## 2015-07-07 DIAGNOSIS — Z880 Allergy status to penicillin: Secondary | ICD-10-CM | POA: Diagnosis not present

## 2015-07-07 DIAGNOSIS — F603 Borderline personality disorder: Secondary | ICD-10-CM | POA: Diagnosis not present

## 2015-07-07 LAB — CBC
HEMATOCRIT: 39.7 % (ref 35.0–47.0)
HEMOGLOBIN: 13.6 g/dL (ref 12.0–16.0)
MCH: 31.5 pg (ref 26.0–34.0)
MCHC: 34.1 g/dL (ref 32.0–36.0)
MCV: 92.2 fL (ref 80.0–100.0)
PLATELETS: 309 10*3/uL (ref 150–440)
RBC: 4.31 MIL/uL (ref 3.80–5.20)
RDW: 15.2 % — ABNORMAL HIGH (ref 11.5–14.5)
WBC: 7.5 10*3/uL (ref 3.6–11.0)

## 2015-07-07 LAB — COMPREHENSIVE METABOLIC PANEL
ALT: 15 U/L (ref 14–54)
AST: 19 U/L (ref 15–41)
Albumin: 4 g/dL (ref 3.5–5.0)
Alkaline Phosphatase: 45 U/L (ref 38–126)
Anion gap: 8 (ref 5–15)
BILIRUBIN TOTAL: 0.5 mg/dL (ref 0.3–1.2)
BUN: 22 mg/dL — AB (ref 6–20)
CHLORIDE: 104 mmol/L (ref 101–111)
CO2: 27 mmol/L (ref 22–32)
Calcium: 9 mg/dL (ref 8.9–10.3)
Creatinine, Ser: 0.79 mg/dL (ref 0.44–1.00)
GFR calc Af Amer: >60 mL/min (ref >60)
Glucose, Bld: 83 mg/dL (ref 65–99)
Potassium: 3.6 mmol/L (ref 3.5–5.1)
Sodium: 139 mmol/L (ref 135–145)
Total Protein: 7.4 g/dL (ref 6.5–8.1)

## 2015-07-07 LAB — ETHANOL

## 2015-07-07 LAB — SALICYLATE LEVEL: Salicylate Lvl: <4 mg/dL (ref 2.8–30.0)

## 2015-07-07 LAB — ACETAMINOPHEN LEVEL: Acetaminophen (Tylenol), Serum: <10 ug/mL — ABNORMAL LOW (ref 10–30)

## 2015-07-07 NOTE — Discharge Instructions (Signed)
Suicidal Feelings: How to Help Yourself °Suicide is the taking of one's own life. If you feel as though life is getting too tough to handle and are thinking about suicide, get help right away. To get help: °· Call your local emergency services (911 in the [place] °· Call a suicide hotline to speak with a trained counselor who understands how you are feeling. The following is a list of suicide hotlines in the [place]. For a list of hotlines in [place], visit www.suicide.org/hotlines/international/[place]-suicide-hotlines.html. °¨  1-800-273-TALK (1-800-273-8255). °¨  1-800-SUICIDE (1-800-784-2433). °¨  1-888-628-9454. This is a hotline for Spanish speakers. °¨  1-800-799-4TTY (1-800-799-4889). This is a hotline for TTY users. °¨  1-866-4-U-TREVOR (1-866-488-7386). This is a hotline for lesbian, gay, bisexual, transgender, or questioning youth. °· Contact a crisis center or a local suicide prevention center. To find a crisis center or suicide prevention center: °¨ Call your local hospital, clinic, community service organization, mental health center, social service provider, or health department. Ask for assistance in connecting to a crisis center. °¨ Visit www.suicidepreventionlifeline.org/getinvolved/locator for a list of crisis centers in the [place], or visit www.suicideprevention.ca/thinking-about-suicide/find-a-crisis-centre for a list of centers in [place]. °· Visit the following websites: °¨  National Suicide Prevention Lifeline: www.suicidepreventionlifeline.org °¨  Hopeline: www.hopeline.com °¨  American Foundation for Suicide Prevention: www.afsp.org °¨  The Trevor Project (for lesbian, gay, bisexual, transgender, or questioning youth): www.thetrevorproject.org °HOW CAN I HELP MYSELF FEEL BETTER? °· Promise yourself that you will not do anything drastic when you have suicidal feelings. Remember, there is hope. Many people have gotten through suicidal thoughts and feelings, and you will, too. You may  have gotten through them before, and this proves that you can get through them again. °· Let family, friends, teachers, or counselors know how you are feeling. Try not to isolate yourself from those who care about you. Remember, they will want to help you. Talk with someone every day, even if you do not feel sociable. Face-to-face conversation is best. °· Call a mental health professional and see one regularly. °· Visit your primary health care provider every year. °· Eat a well-balanced diet, and space your meals so you eat regularly. °· Get plenty of rest. °· Avoid alcohol and drugs, and remove them from your home. They will only make you feel worse. °· If you are thinking of taking a lot of medicine, give your medicine to someone who can give it to you one day at a time. If you are on antidepressants and are concerned you will overdose, let your health care provider know so he or she can give you safer medicines. Ask your mental health professional about the possible side effects of any medicines you are taking. °· Remove weapons, poisons, knives, and anything else that could harm you from your home. °· Try to stick to routines. Follow a schedule every day. Put self-care on your schedule. °· Make a list of realistic goals, and cross them off when you achieve them. Accomplishments give a sense of worth. °· Wait until you are feeling better before doing the things you find difficult or unpleasant. °· Exercise if you are able. You will feel better if you exercise for even a half hour each day. °· Go out in the sun or into nature. This will help you recover from depression faster. If you have a favorite place to walk, go there. °· Do the things that have always given you pleasure. Play your favorite music, read a good book, paint a picture, play your favorite instrument, or do anything   else that takes your mind off your depression if it is safe to do. °· Keep your living space well lit. °· When you are feeling well,  write yourself a letter about tips and support that you can read when you are not feeling well. °· Remember that life's difficulties can be sorted out with help. Conditions can be treated. You can work on thoughts and strategies that serve you well. °  °This information is not intended to replace advice given to you by your health care provider. Make sure you discuss any questions you have with your health care provider. °  °Document Released: 10/08/2002 Document Revised: 04/24/2014 Document Reviewed: 07/29/2013 °Elsevier Interactive Patient Education ©2016 Elsevier Inc. ° °

## 2015-07-07 NOTE — ED Notes (Signed)
Pt arrives to ER via [employer] c/o "I tried to hurt myself". PT took comb and stabbed herself in left forearm. No bleeding, no break in skin present. Pt states that she does not like group home and that is why she wanted to hurt herself. Pt alert and oriented X4, active, cooperative, pt in NAD. RR even and unlabored, color WNL.

## 2015-07-07 NOTE — ED Notes (Signed)

## 2015-07-07 NOTE — ED Notes (Signed)
Patient currently in room resting. No signs of distress noted. Still awaiting [facility].

## 2015-07-07 NOTE — ED Notes (Signed)
Report given to Ruthie, RN.  

## 2015-07-07 NOTE — ED Notes (Signed)
Nurse called [facility] [employer] to confirm [facility]. Company stated that the driver would be on the way to pick up the patient shortly.

## 2015-07-07 NOTE — ED Notes (Signed)
Patient informed that she would be discharged back to the group home. Initially patient was greatly dissappointed, but patient was given list of group homes to call and this made her feel better. Patient remains cooperative and calm. Maintained on 15 minute checks and observation by security camera for safety.

## 2015-07-07 NOTE — ED Notes (Signed)
Nurse informed physician on duty  that patient stated that upon discharge she would go back to the group home and "hurt herself". Dr. Mayford KnifeWilliams attending physician aware and cleared patient for discharge.

## 2015-07-07 NOTE — ED Provider Notes (Addendum)
[facility] Emergency Department Provider Note     Time seen: ----------------------------------------- 2:31 PM on 07/07/2015 -----------------------------------------    I have reviewed the triage vital signs and the nursing notes.   HISTORY  Chief Complaint Suicidal    HPI [name] is a 50 y.o. female who presents to ER for wanting to hurt herself. Patient states took home. Herself in the forearm. Patient states she does not like her group home and that is why she hurt herself. She does have suicidal thoughts, denies any other complaints or medical issues.   Past Medical History  Diagnosis Date  . Asthma   . Anxiety   . GERD (gastroesophageal reflux disease)   . Mild cognitive impairment   . Cerebral palsy (HCC)   . Borderline personality disorder   . Depression   . Wound abscess   . Schizoaffective disorder [facility])     Patient Active Problem List   Diagnosis Date Noted  . Schizoaffective disorder, bipolar type (HCC)   . Schizoaffective disorder (HCC) 06/17/2015  . 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 GERD (gastroesophageal reflux disease) 09/24/2014  . COPD (chronic obstructive pulmonary disease) (HCC) 09/24/2014  . Borderline personality disorder 09/24/2014  . Cerebral palsy (HCC) 08/31/2014    Past Surgical History  Procedure Laterality Date  . Cholecystectomy    . Tonsillectomy    . Incision and drainage abscess Right 01/02/2015    Procedure: INCISION AND DRAINAGE ABSCESS;  Surgeon: Tiney Rouge III, MD;  Location: [facility] ORS;  Service: General;  Laterality: Right;    Allergies Penicillins  Social History Social History  Substance Use Topics  . Smoking status: Never Smoker   . Smokeless tobacco: Never Used  . Alcohol Use: No    Review of Systems Constitutional: Negative for fever. Eyes: Negative for visual changes. ENT: Negative for sore throat. Cardiovascular: Negative for chest pain. Respiratory: Negative  for shortness of breath. Gastrointestinal: Negative for abdominal pain, vomiting and diarrhea. Genitourinary: Negative for dysuria. Musculoskeletal: Negative for back pain. Skin: Negative for rash. Neurological: Negative for headaches, focal weakness or numbness. Psychiatric: Positive for suicidal ideation  10-point ROS otherwise negative.  ____________________________________________   PHYSICAL EXAM:  VITAL SIGNS: ED Triage Vitals  Enc Vitals Group     BP 07/07/15 1251 133/70 mmHg     Pulse Rate 07/07/15 1251 72     Resp 07/07/15 1251 22     Temp 07/07/15 1251 97.4 F (36.3 C)     Temp Source 07/07/15 1251 Oral     SpO2 07/07/15 1251 98 %     Weight 07/07/15 1251 207 lb (93.895 kg)     Height 07/07/15 1251  (1.626 m)     Head Cir --      Peak Flow --      Pain Score --      Pain Loc --      Pain Edu? --      Excl. in GC? --     Constitutional: Alert, No acute distress Eyes: Conjunctivae are normal. PERRL. Normal extraocular movements. ENT   Head: Normocephalic and atraumatic.   Nose: No congestion/rhinnorhea.   Mouth/Throat: Mucous membranes are moist.   Neck: No stridor. Cardiovascular: Normal rate, regular rhythm. Normal and symmetric distal pulses are present in all extremities. No murmurs, rubs, or gallops. Respiratory: Normal respiratory effort without tachypnea nor retractions. Breath sounds are clear and equal bilaterally. No wheezes/rales/rhonchi. Gastrointestinal: Soft and nontender. No distention. No abdominal bruits.  Musculoskeletal: Nontender with  normal range of motion in all extremities.  Neurologic:  Normal speech and language. No gross focal neurologic deficits are appreciated.  Skin:  Skin is warm, dry and intact. No rash noted. Psychiatric: Bizarre mood and affect at times. Patient states she is suicidal ____________________________________________  ED COURSE:  Pertinent labs & imaging results that were available during my care  of the patient were reviewed by me and considered in my medical decision making (see chart for details). Patient's no acute distress, I do not feel she is suicidal, will consult psychiatry. ____________________________________________    LABS (pertinent positives/negatives)  Labs Reviewed  COMPREHENSIVE METABOLIC PANEL - Abnormal; Notable for the following:    BUN 22 (*)    All other components within normal limits  ACETAMINOPHEN LEVEL - Abnormal; Notable for the following:    Acetaminophen (Tylenol), Serum <10 (*)    All other components within normal limits  CBC - Abnormal; Notable for the following:    RDW 15.2 (*)    All other components within normal limits  ETHANOL  SALICYLATE LEVEL  URINE DRUG SCREEN, QUALITATIVE ([facility] ONLY)   ____________________________________________  FINAL ASSESSMENT AND PLAN  Suicidal ideation  Plan: Patient with labs as dictated above. Patient with a history of similar behavior, exhibits passive suicidal ideation. She is not felt to be a threat to herself or others, we will return her to the group home and overturn involuntary commitment.   Emily FilbertWilliams, Rikki Trosper E, MD   Emily FilbertJonathan E Declin Rajan, MD 07/07/15 1433  Emily FilbertJonathan E Hina Gupta, MD 07/07/15 [phone]

## 2015-07-07 NOTE — ED Notes (Signed)
Patient states that she still feels suicidal and when asked to contract for safety patient stated, "No". Denies HI, endorses auditory and visual hallucinations of "different things". Patient states that she feels sad, depressed and frustrated that she has not found a new group home at this point. When asked she stated that she would feel "hurt" if she had to go back to the current group home. Patient is calm and cooperative at this time. No signs of acute distress. Maintained on 15 minute checks and observation by security camera for safety.

## 2015-07-07 NOTE — BH Assessment (Signed)
Per ER Staff(Dr. Mayford KnifeWilliams) the patient doesn't need to be seen by Behavioral Medicine. Patient will be discharging from ER. ER MD Rescinded IVC.

## 2015-07-07 NOTE — Consult Note (Signed)
  Psychiatry: Consult noted. Patient is well known from multiple prior visits. Prior to my seeing the patient Dr. Mayford KnifeWilliams the emergency room attending spoke with me and advised me that he had taken it upon himself to discontinue her involuntary commitment paperwork and to discharge her from the emergency room. Given that the plan has already been put into effect there does not seem to be need for me to reevaluate the patient right now. Chart reviewed. Patient seems to have presented with similar problems what she has had multiple times in the past. She is known to have adequate outpatient psychiatric treatment in place. Nursing indicates that transportation has already been contacted. No further consultation at this point.

## 2015-07-07 NOTE — ED Notes (Signed)
Patient denies SI/HI/AVH and pain. Discharge instructions reviewed. All belongings returned to patient. [facility]

## 2015-09-24 ENCOUNTER — Emergency Department
Admission: EM | Admit: 2015-09-24 | Discharge: 2015-09-25 | Disposition: A | Discharge status: Home / Self Care | Payer: Medicare Other | Attending: Emergency Medicine | Admitting: Emergency Medicine

## 2015-09-24 ENCOUNTER — Encounter: Payer: Self-pay | Admitting: Emergency Medicine

## 2015-09-24 DIAGNOSIS — F259 Schizoaffective disorder, unspecified: Secondary | ICD-10-CM | POA: Diagnosis not present

## 2015-09-24 DIAGNOSIS — Z79899 Other long term (current) drug therapy: Secondary | ICD-10-CM | POA: Diagnosis not present

## 2015-09-24 DIAGNOSIS — J45909 Unspecified asthma, uncomplicated: Secondary | ICD-10-CM | POA: Diagnosis not present

## 2015-09-24 DIAGNOSIS — F250 Schizoaffective disorder, bipolar type: Secondary | ICD-10-CM | POA: Diagnosis not present

## 2015-09-24 DIAGNOSIS — Z791 Long term (current) use of non-steroidal anti-inflammatories (NSAID): Secondary | ICD-10-CM | POA: Diagnosis not present

## 2015-09-24 DIAGNOSIS — Z7951 Long term (current) use of inhaled steroids: Secondary | ICD-10-CM | POA: Insufficient documentation

## 2015-09-24 DIAGNOSIS — Z7982 Long term (current) use of aspirin: Secondary | ICD-10-CM | POA: Diagnosis not present

## 2015-09-24 DIAGNOSIS — J449 Chronic obstructive pulmonary disease, unspecified: Secondary | ICD-10-CM | POA: Diagnosis not present

## 2015-09-24 DIAGNOSIS — F329 Major depressive disorder, single episode, unspecified: Secondary | ICD-10-CM | POA: Insufficient documentation

## 2015-09-24 DIAGNOSIS — R45851 Suicidal ideations: Secondary | ICD-10-CM | POA: Diagnosis present

## 2015-09-24 LAB — ACETAMINOPHEN LEVEL: Acetaminophen (Tylenol), Serum: <10 ug/mL — ABNORMAL LOW (ref 10–30)

## 2015-09-24 LAB — COMPREHENSIVE METABOLIC PANEL
ALK PHOS: 45 U/L (ref 38–126)
ALT: 30 U/L (ref 14–54)
AST: 36 U/L (ref 15–41)
Albumin: 3.7 g/dL (ref 3.5–5.0)
Anion gap: 6 (ref 5–15)
BUN: 18 mg/dL (ref 6–20)
CALCIUM: 9 mg/dL (ref 8.9–10.3)
CHLORIDE: 105 mmol/L (ref 101–111)
CO2: 28 mmol/L (ref 22–32)
CREATININE: 0.79 mg/dL (ref 0.44–1.00)
Glucose, Bld: 103 mg/dL — ABNORMAL HIGH (ref 65–99)
Potassium: 3.5 mmol/L (ref 3.5–5.1)
Sodium: 139 mmol/L (ref 135–145)
Total Bilirubin: 0.4 mg/dL (ref 0.3–1.2)
Total Protein: 6.8 g/dL (ref 6.5–8.1)

## 2015-09-24 LAB — CBC
HEMATOCRIT: 37 % (ref 35.0–47.0)
Hemoglobin: 13 g/dL (ref 12.0–16.0)
MCH: 32.5 pg (ref 26.0–34.0)
MCHC: 35.2 g/dL (ref 32.0–36.0)
MCV: 92.3 fL (ref 80.0–100.0)
Platelets: 260 10*3/uL (ref 150–440)
RBC: 4.01 MIL/uL (ref 3.80–5.20)
RDW: 13.9 % (ref 11.5–14.5)
WBC: 6.3 10*3/uL (ref 3.6–11.0)

## 2015-09-24 LAB — URINE DRUG SCREEN, QUALITATIVE (ARMC ONLY)
AMPHETAMINES, UR SCREEN: NOT DETECTED
Barbiturates, Ur Screen: NOT DETECTED
Benzodiazepine, Ur Scrn: POSITIVE — AB
CANNABINOID 50 NG, UR ~~LOC~~: NOT DETECTED
COCAINE METABOLITE, UR ~~LOC~~: NOT DETECTED
MDMA (ECSTASY) UR SCREEN: POSITIVE — AB
Methadone Scn, Ur: NOT DETECTED
OPIATE, UR SCREEN: NOT DETECTED
PHENCYCLIDINE (PCP) UR S: NOT DETECTED
Tricyclic, Ur Screen: NOT DETECTED

## 2015-09-24 LAB — ETHANOL

## 2015-09-24 LAB — SALICYLATE LEVEL

## 2015-09-24 MED ORDER — AMANTADINE HCL 100 MG PO CAPS
100.0000 mg | ORAL_CAPSULE | Freq: Every day | ORAL | Status: DC
  Administered 2015-09-25: 100 mg via ORAL
  Filled 2015-09-24 (×2): qty 1

## 2015-09-24 MED ORDER — DIVALPROEX SODIUM ER 500 MG PO TB24
500.0000 mg | ORAL_TABLET | Freq: Two times a day (BID) | ORAL | Status: DC
  Administered 2015-09-24 – 2015-09-25 (×2): 500 mg via ORAL
  Filled 2015-09-24 (×2): qty 1

## 2015-09-24 MED ORDER — DICYCLOMINE HCL 20 MG PO TABS
20.0000 mg | ORAL_TABLET | Freq: Three times a day (TID) | ORAL | Status: DC | PRN
  Filled 2015-09-24: qty 1

## 2015-09-24 MED ORDER — ASPIRIN EC 81 MG PO TBEC
81.0000 mg | DELAYED_RELEASE_TABLET | Freq: Every day | ORAL | Status: DC
  Administered 2015-09-25: 81 mg via ORAL
  Filled 2015-09-24: qty 1

## 2015-09-24 MED ORDER — LORAZEPAM 1 MG PO TABS: 1.0000 mg | ORAL_TABLET | Freq: Two times a day (BID) | ORAL | Status: DC | PRN

## 2015-09-24 MED ORDER — ESCITALOPRAM OXALATE 10 MG PO TABS
30.0000 mg | ORAL_TABLET | Freq: Every day | ORAL | Status: DC
  Administered 2015-09-25: 30 mg via ORAL
  Filled 2015-09-24: qty 3

## 2015-09-24 MED ORDER — RISPERIDONE 3 MG PO TABS
4.0000 mg | ORAL_TABLET | Freq: Every day | ORAL | Status: DC
  Administered 2015-09-24: 4 mg via ORAL
  Filled 2015-09-24: qty 1

## 2015-09-24 MED ORDER — HYDROXYZINE HCL 25 MG PO TABS
50.0000 mg | ORAL_TABLET | Freq: Every day | ORAL | Status: DC
  Administered 2015-09-24: 50 mg via ORAL
  Filled 2015-09-24: qty 2

## 2015-09-24 NOTE — ED Notes (Signed)
D: Pt received from ED-quad. Patient alert and oriented x4. Patient endorses SI w/plan to "cut myself with a razor." Pt verbally contracts for safety. Pt endorses AH that "tell me to hurt myself." Pt denies VH/HI. Pt affect is anxious and depressed.Pt indicated that she told staff at her group home that she had a razor and wanted to cut herself, and staff called 911. Pt could not identify any new or current stressors that caused SI. When asked about her group home pt stated "Not the best group home.Mar[facility] Kitchen.Marland Kitchen.I guess I'm getting used to it." Pt rated depression 10/10 and anxiety 10/10. Pt had no other complaints.  A: Offered active listening and support. Provided therapeutic communication. Administered scheduled medications.  R: Pt pleasant and cooperative. Pt fell asleep shortly after arriving to unit. Pt medication compliant. Will continue Q15 min checks. Will continue hourly rounding. Safety maintained.

## 2015-09-24 NOTE — ED Notes (Signed)
[facility] [place]

## 2015-09-24 NOTE — ED Notes (Signed)
Pt arrived via [facility] from [facility]. Staff report pt put a razor to her wrist and acted like she was going to cut herself. Pt stated she was going to run out in traffic. Pt states in triage she just doesn't want to live anymore.

## 2015-09-24 NOTE — BH Assessment (Signed)
Assessment Note  [name] is an 50 y.o. female who presents to the ER due to voicing SI with the plan to cut her wrist. Per the Group Home Staff, at [facility] ([place]) [name] [phone]). She told them early today she felt like hurting herself, they told her to go lay down. She then went back to staff and said she's having thoughts of cutting herself with a razor. They took all razors from her. She had a disposable, single blade razor, it was still intact. The plastic cover was still on the top of it. Several minutes latter, the patient went to her room and stated she feel like she is going to run into traffic. That is when the staff contact the owner. Thus, staff was instructed to call 911 and bring her to the ER.  Per the report of the patient, she was having SI with plans to cut herself with a razor. Patient states she held the razor in her hand. At this point, the patient's story changed several different times. At one point, she said she got the razor from her boyfriend. Then she said it was her razor. At one point she stated, she was in the bathroom and staff stopped her. Because they walked in on her. Then she stated she stopped herself and then told staff. When writer told her [facility] didn't have any beds, she shared how she was having thoughts of walking into traffic and or overdose on medications.  Patient has had multiple visits to the ER with similarly presentation and complaint. She has had no past gestures or attempts. With today's ER visit, she have no lacerations or bruises on her. Patient have a history of getting upset with staff or someone at the home, then she voices SI in order to come to the ER.  At this point, it is unclear what the patient's motivation or secondary gain is but based on her history, she is wanting to move into a new Group Home.  She denies HI and AV/H. She have no history of violence or aggression.  Past Medical History:  Past Medical History   Diagnosis Date  . Asthma   . Anxiety   . GERD (gastroesophageal reflux disease)   . Mild cognitive impairment   . Cerebral palsy (HCC)   . Borderline personality disorder   . Depression   . Wound abscess   . Schizoaffective disorder [facility])     Past Surgical History  Procedure Laterality Date  . Cholecystectomy    . Tonsillectomy    . Incision and drainage abscess Right 01/02/2015    Procedure: INCISION AND DRAINAGE ABSCESS;  Surgeon: Tiney Rouge III, MD;  Location: [facility] ORS;  Service: General;  Laterality: Right;    Family History:  Family History  Problem Relation Age of Onset  . Diabetes Mother   . Heart attack Father     Social History:  reports that she has never smoked. She has never used smokeless tobacco. She reports that she does not drink alcohol or use illicit drugs.  Additional Social History:  Alcohol / Drug Use Pain Medications: See PTA Prescriptions: See PTA Over the Counter: See PTA History of alcohol / drug use?: No history of alcohol / drug abuse Longest period of sobriety (when/how long): Reports of none Negative Consequences of Use:  (Reports of no use) Withdrawal Symptoms:  (Reports of no use)  CIWA: CIWA-Ar BP: 121/88 mmHg Pulse Rate: 72 COWS:    Allergies:  Allergies  Allergen Reactions  . Penicillins Other (See Comments)    Reaction:  Unknown     Home Medications:  (Not in a hospital admission)  OB/GYN Status:  No LMP recorded. Patient has had an implant.  General Assessment Data Location of Assessment: [facility] ED TTS Assessment: In system Is this a Tele or Face-to-Face Assessment?: Face-to-Face Is this an Initial Assessment or a Re-assessment for this encounter?: Initial Assessment Marital status: Single Maiden name: n/a Is patient pregnant?: No Pregnancy Status: No Living Arrangements: Group Home [employer] Group Home-[phone]) Can pt return to current living arrangement?: Yes Admission Status: Involuntary Is patient capable  of signing voluntary admission?: No Referral Source: Self/Family/Friend Insurance type: n/a  Medical Screening Exam [facility] Walk-in ONLY) Medical Exam completed: Yes  Crisis Care Plan Living Arrangements: Group Home [address] Group Home-[phone]) Legal Guardian: Other: (Reports of none) Name of Psychiatrist: Dr. Omelia Blackwater ([employer]) Name of Therapist: Reports of none  Education Status Is patient currently in school?: No Current Grade: n/a Highest grade of school patient has completed: 10th Grade Name of school: n/a Contact person: n/a  Risk to self with the past 6 months Suicidal Ideation: Yes-Currently Present Has patient been a risk to self within the past 6 months prior to admission? : No Suicidal Intent: No Has patient had any suicidal intent within the past 6 months prior to admission? : No Is patient at risk for suicide?: No Suicidal Plan?: Yes-Currently Present Has patient had any suicidal plan within the past 6 months prior to admission? : Yes Specify Current Suicidal Plan: Cut self Access to Means: No What has been your use of drugs/alcohol within the last 12 months?: Reports of none Previous Attempts/Gestures: No How many times?: 0 Other Self Harm Risks: Reports of none Triggers for Past Attempts: None known Intentional Self Injurious Behavior: None Family Suicide History: No Recent stressful life event(s): Other (Comment) (Don't like Group Home) Persecutory voices/beliefs?: No Depression: No Depression Symptoms: Feeling angry/irritable Substance abuse history and/or treatment for substance abuse?: No Suicide prevention information given to non-admitted patients: Not applicable  Risk to Others within the past 6 months Homicidal Ideation: No Does patient have any lifetime risk of violence toward others beyond the six months prior to admission? : No Thoughts of Harm to Others: No Current Homicidal Intent: No Current Homicidal Plan: No Access to  Homicidal Means: No Identified Victim: Reports of none History of harm to others?: No Assessment of Violence: None Noted Violent Behavior Description: Reports of none Does patient have access to weapons?: No Criminal Charges Pending?: No Does patient have a court date: No Is patient on probation?: No  Psychosis Hallucinations: None noted Delusions: None noted  Mental Status Report Appearance/Hygiene: In hospital gown, Unremarkable, In scrubs Eye Contact: Fair Motor Activity: Freedom of movement, Unremarkable Speech: Logical/coherent, Unremarkable Level of Consciousness: Alert Mood: Depressed, Anxious, Sad, Pleasant Affect: Appropriate to circumstance, Sad Anxiety Level: Minimal Thought Processes: Coherent, Relevant Judgement: Unimpaired Orientation: Person, Place, Time, Situation, Appropriate for developmental age Obsessive Compulsive Thoughts/Behaviors: Minimal  Cognitive Functioning Concentration: Normal Memory: Remote Intact, Recent Intact IQ: Average Insight: Fair Impulse Control: Poor Appetite: Good Weight Loss: 0 Weight Gain: 0 Sleep: No Change Total Hours of Sleep: 8 Vegetative Symptoms: None  ADLScreening [facility] Assessment Services) Patient's cognitive ability adequate to safely complete daily activities?: Yes Patient able to express need for assistance with ADLs?: Yes Independently performs ADLs?: Yes (appropriate for developmental age)  Prior Inpatient Therapy Prior Inpatient Therapy: Yes Prior Therapy Dates:  Multiple Inpatient Admissions [facility] Reason for Treatment: Depression & SI  Prior Outpatient Therapy Prior Outpatient Therapy: Yes Prior Therapy Dates: Currently Prior [facility] Reason for Treatment: Depression Does patient have an ACCT team?: No Does patient have Intensive In-House Services?  : No Does patient have [facility] services? : No Does patient have P4CC  services?: No  ADL Screening (condition at time of admission) Patient's cognitive ability adequate to safely complete daily activities?: Yes Is the patient deaf or have difficulty hearing?: No Does the patient have difficulty seeing, even when wearing glasses/contacts?: No Does the patient have difficulty concentrating, remembering, or making decisions?: No Patient able to express need for assistance with ADLs?: Yes Does the patient have difficulty dressing or bathing?: No Independently performs ADLs?: Yes (appropriate for developmental age) Does the patient have difficulty walking or climbing stairs?: No Weakness of Legs: None Weakness of Arms/Hands: None  Home Assistive Devices/Equipment Home Assistive Devices/Equipment: None  Therapy Consults (therapy consults require a physician order) PT Evaluation Needed: No OT Evalulation Needed: No SLP Evaluation Needed: No Abuse/Neglect Assessment (Assessment to be complete while patient is alone) Physical Abuse: Denies Verbal Abuse: Denies Sexual Abuse: Denies Exploitation of patient/patient's resources: Denies Self-Neglect: Denies Values / Beliefs Cultural Requests During Hospitalization: None Spiritual Requests During Hospitalization: None Consults Spiritual Care Consult Needed: No Social Work Consult Needed: No      Additional Information 1:1 In Past 12 Months?: No CIRT Risk: No Elopement Risk: No Does patient have medical clearance?: Yes  Child/Adolescent Assessment Running Away Risk: Denies (Patient is an adult)  Disposition:  Disposition Initial Assessment Completed for this Encounter: Yes Disposition of Patient: Other dispositions (ER MD Ordered Psych Consult) Other disposition(s): Other (Comment) (ER MD Ordered Psych Consult)  On Site Evaluation by:   Reviewed with Physician:    Lilyan Gilfordalvin J. Marvel Sapp MS, LCAS, LPC, NCC, CCSI Therapeutic Triage Specialist 09/24/2015 7:20 PM

## 2015-09-24 NOTE — ED Provider Notes (Signed)
[facility] Emergency Department Provider Note  ____________________________________________    I have reviewed the triage vital signs and the nursing notes.   HISTORY  Chief Complaint Suicidal    HPI [name] is a 50 y.o. female who presents with complaints of suicidal ideation. Patient is well-known to our emergency department. She has been here multiple times for similar complaints. She states she does not like her group home. Reportedly she wanted to cut herself with a razor but her group home reports she did not have a razor. She also states she wants to walk out into traffic. She has said this frequently in the past as well. She was admitted to [facility] in March 2017. IVC papers filled out by [facility] police department     Past Medical History  Diagnosis Date  . Asthma   . Anxiety   . GERD (gastroesophageal reflux disease)   . Mild cognitive impairment   . Cerebral palsy (HCC)   . Borderline personality disorder   . Depression   . Wound abscess   . Schizoaffective disorder [facility])     Patient Active Problem List   Diagnosis Date Noted  . Schizoaffective disorder, bipolar type (HCC)   . Schizoaffective disorder (HCC) 06/17/2015  . 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 GERD (gastroesophageal reflux disease) 09/24/2014  . COPD (chronic obstructive pulmonary disease) (HCC) 09/24/2014  . Borderline personality disorder 09/24/2014  . Cerebral palsy (HCC) 08/31/2014    Past Surgical History  Procedure Laterality Date  . Cholecystectomy    . Tonsillectomy    . Incision and drainage abscess Right 01/02/2015    Procedure: INCISION AND DRAINAGE ABSCESS;  Surgeon: Tiney Rouge III, MD;  Location: [facility] ORS;  Service: General;  Laterality: Right;    Current Outpatient Rx  Name  Route  Sig  Dispense  Refill  . aspirin EC 81 MG tablet   Oral   Take 81 mg by mouth daily.         Marland Kitchen dicyclomine (BENTYL) 20 MG tablet   Oral    Take 1 tablet (20 mg total) by mouth 3 (three) times daily as needed for spasms.   30 tablet   0   . divalproex (DEPAKOTE ER) 250 MG 24 hr tablet   Oral   Take 500 mg by mouth 2 (two) times daily.         Marland Kitchen escitalopram (LEXAPRO) 10 MG tablet   Oral   Take 3 tablets (30 mg total) by mouth daily.   90 tablet   0   . fenofibrate 54 MG tablet   Oral   Take 54 mg by mouth daily.         . Fluticasone-Salmeterol (ADVAIR) 100-50 MCG/DOSE AEPB   Inhalation   Inhale 1 puff into the lungs 2 (two) times daily.          . meloxicam (MOBIC) 15 MG tablet   Oral   Take 15 mg by mouth daily.         . risperidone (RISPERDAL) 4 MG tablet   Oral   Take 1 tablet (4 mg total) by mouth at bedtime.   30 tablet   0     Allergies Penicillins  Family History  Problem Relation Age of Onset  . Diabetes Mother   . Heart attack Father     Social History Social History  Substance Use Topics  . Smoking status: Never Smoker   . Smokeless tobacco: Never Used  . Alcohol  Use: No    Review of Systems  Constitutional: Negative for fever. Eyes: Negative for redness ENT: Negative for sore throat Cardiovascular: Negative for chest pain Respiratory: Negative for shortness of breath. Gastrointestinal: Negative for abdominal pain Genitourinary: Negative for dysuria. Musculoskeletal: Negative for back pain. Skin: Negative for rash. Neurological: Negative for focal weakness Psychiatric: Positive depression    ____________________________________________   PHYSICAL EXAM:  VITAL SIGNS: ED Triage Vitals  Enc Vitals Group     BP 09/24/15 1716 121/88 mmHg     Pulse Rate 09/24/15 1716 72     Resp 09/24/15 1716 18     Temp 09/24/15 1716 98.2 F (36.8 C)     Temp Source 09/24/15 1716 Oral     SpO2 09/24/15 1716 95 %     Weight 09/24/15 1716 194 lb (87.998 kg)     Height 09/24/15 1716 5\' 4"  (1.626 m)     Head Cir --      Peak Flow --      Pain Score --      Pain Loc --       Pain Edu? --      Excl. in GC? --      Constitutional: Alert and oriented. Well appearing and in no distress.  Eyes: Conjunctivae are normal. No erythema or injection ENT   Head: Normocephalic and atraumatic.   Mouth/Throat: Mucous membranes are moist. Cardiovascular: Normal rate, regular rhythm. Normal and symmetric distal pulses are present in the upper extremities.  Respiratory: Normal respiratory effort without tachypnea nor retractions. Breath sounds are clear and equal bilaterally.  Gastrointestinal: Soft and non-tender in all quadrants. No distention. There is no CVA tenderness. Genitourinary: deferred Musculoskeletal: Nontender with normal range of motion in all extremities. No lower extremity tenderness nor edema. Neurologic:  Normal speech and language. No gross focal neurologic deficits are appreciated. Skin:  Skin is warm, dry and intact. No rash noted. Psychiatric: Mood and affect are normal. Patient exhibits appropriate insight and judgment.  ____________________________________________    LABS (pertinent positives/negatives)  Labs Reviewed  COMPREHENSIVE METABOLIC PANEL - Abnormal; Notable for the following:    Glucose, Bld 103 (*)    All other components within normal limits  ACETAMINOPHEN LEVEL - Abnormal; Notable for the following:    Acetaminophen (Tylenol), Serum <10 (*)    All other components within normal limits  URINE DRUG SCREEN, QUALITATIVE ([facility] ONLY) - Abnormal; Notable for the following:    MDMA (Ecstasy)Ur Screen POSITIVE (*)    Benzodiazepine, Ur Scrn POSITIVE (*)    All other components within normal limits  ETHANOL  SALICYLATE LEVEL  CBC    ____________________________________________   EKG  None  ____________________________________________    RADIOLOGY  None  ____________________________________________   PROCEDURES  Procedure(s) performed: none  Critical Care performed:  none  ____________________________________________   INITIAL IMPRESSION / ASSESSMENT AND PLAN / ED COURSE  Pertinent labs & imaging results that were available during my care of the patient were reviewed by me and considered in my medical decision making (see chart for details).  Patient presents with complaints of suicidal ideation, similar to ED visits in the past. She benefited from admission in March, she seems to want to stay in the emergency department so I will ask psych to see her.  ____________________________________________   FINAL CLINICAL IMPRESSION(S) / ED DIAGNOSES  Final diagnoses:  Schizoaffective disorder, unspecified type (HCC)          Jene Everyobert Derriana Oser, MD 09/24/15 [phone]

## 2015-09-24 NOTE — ED Notes (Signed)
Patient here today because she threatened to harm herself today at group home. Patient has been seen here multiple times for same. Patient is IVC. States she was going to cut herself with a razor today. States she does not want to harm herself or others at this time.

## 2015-09-25 DIAGNOSIS — F259 Schizoaffective disorder, unspecified: Secondary | ICD-10-CM | POA: Diagnosis not present

## 2015-09-25 MED ORDER — AMANTADINE HCL 100 MG PO CAPS
100.0000 mg | ORAL_CAPSULE | Freq: Two times a day (BID) | ORAL | Status: DC
  Filled 2015-09-25 (×2): qty 1

## 2015-09-25 MED ORDER — RISPERIDONE 3 MG PO TABS: 3.0000 mg | ORAL_TABLET | Freq: Every day | ORAL | Status: DC

## 2015-09-25 MED ORDER — AMANTADINE HCL 100 MG PO CAPS: 100.0000 mg | ORAL_CAPSULE | Freq: Two times a day (BID) | ORAL | Status: DC

## 2015-09-25 NOTE — ED Notes (Signed)
Patient talked with Dr. Clearnce SorrelFeheem, patient is going to be discharged back to group home.

## 2015-09-25 NOTE — ED Notes (Signed)
Patient ambulated to lobby without difficulty and cab transported home, patient without signs of distress.

## 2015-09-25 NOTE — ED Provider Notes (Signed)
-----------------------------------------   12:38 PM on 09/25/2015 -----------------------------------------  The patient has been seen and evaluated by psychiatry. They believe the patient is safe for discharge home at this time and they have rescinded her IVC. Dr. Garnetta BuddyFaheem wishes to decrease the patient's Risperdal to 3 mg daily and amantadine 100 mg twice a day. I will prescribe the patient knew prescriptions for these 2 medications. Patient will be discharged home.  Minna AntisKevin Ceara Wrightson, MD 09/25/15 1239

## 2015-09-25 NOTE — ED Notes (Signed)
Patient is sitting in dayroom watching tv, she is calm, states that she is still feeling SI, will continue to monitor.

## 2015-09-25 NOTE — Discharge Instructions (Signed)
You have been seen in the emergency department for a  psychiatric concern. You have been evaluated both medically as well as psychiatrically. Please follow-up with your outpatient resources provided. Return to the emergency department for any worsening symptoms, or any thoughts of hurting yourself or anyone else so that we may attempt to help you. Your medications have been adjusted. Please note you have been given prescriptions for Risperdal 3 mg to be taken each night, please discontinue use of your Risperdal 4 mg tablets. You also have been prescribed amantadine 100 mg capsules these are to be taken twice daily instead of once daily as you were previously prescribed.   Schizoaffective Disorder Schizoaffective disorder (ScAD) is a mental illness. It causes symptoms that are a mixture of schizophrenia (a psychotic disorder) and an affective (mood) disorder. The schizophrenic symptoms may include delusions, hallucinations, or odd behavior. The mood symptoms may be similar to major depression or bipolar disorder. ScAD may interfere with personal relationships or normal daily activities. People with ScAD are at increased risk for job loss, social isolation,physical health problems, anxiety and substance use disorders, and suicide. ScAD usually occurs in cycles. Periods of severe symptoms are followed by periods of less severe symptoms or improvement. The illness affects men and women equally but usually appears at an earlier age (teenage or early adult years) in men. People who have family members with schizophrenia, bipolar disorder, or ScAD are at higher risk of developing ScAD. SYMPTOMS  At any one time, people with ScAD may have psychotic symptoms only or both psychotic and mood symptoms. The psychotic symptoms include one or more of the following:  Hearing, seeing, or feeling things that are not there (hallucinations).   Having fixed, false beliefs (delusions). The delusions usually are of being  attacked, harassed, cheated, persecuted, or conspired against (paranoid delusions).  Speaking in a way that makes no sense to others (disorganized speech). The psychotic symptoms of ScAD may also include confusing or odd behavior or any of the negative symptoms of schizophrenia. These include loss of motivation for normal daily activities, such as bathing or grooming, withdrawal from other people, and lack of emotions.  The mood symptoms of ScAD occur more often than not. They resemble major depressive disorder or bipolar mania. Symptoms of major depression include depressed mood and four or more of the following:  Loss of interest in usually pleasurable activities (anhedonia).  Sleeping more or less than normal.  Feeling worthless or excessively guilty.  Lack of energy or motivation.  Trouble concentrating.  Eating more or less than usual.  Thinking a lot about death or suicide. Symptoms of bipolar mania include abnormally elevated or irritable mood and increased energy or activity, plus three or more of the following:   More confidence than normal or feeling that you are able to do anything (grandiosity).  Feeling rested with less sleep than normal.   Being easily distracted.   Talking more than usual or feeling pressured to keep talking.   Feeling that your thoughts are racing.  Engaging in high-risk activities such as buying sprees or foolish business decisions. DIAGNOSIS  ScAD is diagnosed through an assessment by your health care provider. Your health care provider will observe and ask questions about your thoughts, behavior, mood, and ability to function in daily life. Your health care provider may also ask questions about your medical history and use of drugs, including prescription medicines. Your health care provider may also order blood tests and imaging exams. Certain medical conditions  and substances can cause symptoms that resemble ScAD. Your health care provider  may refer you to a mental health specialist for evaluation.  ScAD is divided into two types. The depressive type is diagnosed if your mood symptoms are limited to major depression. The bipolar type is diagnosed if your mood symptoms are manic or a mixture of manic and depressive symptoms TREATMENT  ScAD is usually a lifelong illness. Long-term treatment is necessary. The following treatments are available:  Medicine. Different types of medicine are used to treat ScAD. The exact combination depends on the type and severity of your symptoms. Antipsychotic medicine is used to control psychotic symptomssuch as delusions, paranoia, and hallucinations. Mood stabilizers can even the highs and lows of bipolar manic mood swings. Antidepressant medicines are used to treat major depressive symptoms.  Counseling or talk therapy. Individual, group, or family counseling may be helpful in providing education, support, and guidance. Many people with ScAD also benefit from social skills and job skills (vocational) training. A combination of medicine and counseling is usually best for managing the disorder over time. A procedure in which electricity is applied to the brain through the scalp (electroconvulsive therapy) may be used to treat people with severe manic symptoms that do not respond to medicine and counseling. HOME CARE INSTRUCTIONS   Take all your medicine as prescribed.  Check with your health care provider before starting new prescription or over-the-counter medicines.  Keep all follow up appointments with your health care provider. SEEK MEDICAL CARE IF:   If you are not able to take your medicines as prescribed.  If your symptoms get worse. SEEK IMMEDIATE MEDICAL CARE IF:   You have serious thoughts about hurting yourself or others.   This information is not intended to replace advice given to you by your health care provider. Make sure you discuss any questions you have with your health care  provider.   Document Released: 08/14/2006 Document Revised: 04/24/2014 Document Reviewed: 11/15/2012 Else[employer] Interactive Patient Education [employer]

## 2015-09-25 NOTE — ED Notes (Signed)
Patient's breakfast served, patient is oriented, but states " i had a razor blade and was going to cut myself and I still want to" Patient is flat and speaks slowly, patient states that she does have a boyfriend in the group home and that He does get on her nerves, but nothing hat triggered her wanting to kill herself , she just states " I'm sick of everything"    Nurse will continue to monitor and nurse talked to her about " this too shall pass" and attempted to encouraged her, but patient remains with si ideation. Patient does have some delays mentally and speech impediment.patient with q 15 min checks and 24 hour surveillance.

## 2015-09-25 NOTE — ED Notes (Signed)
Patient received lunch tray, patient oriented, no evidence of distress at this time. Patient is safe, and states that she still has thoughts of si, but no plan at this time.

## 2015-09-25 NOTE — Progress Notes (Signed)
TTS has spoke with Angie at the pts group home, Staff has agreed to send a cab to transport pt back to the facility.  09/25/2015 Cheryl FlashNicole Pressley Tadesse, MS, NCC, LPCA Therapeutic Triage Specialist

## 2015-09-25 NOTE — ED Notes (Signed)
Patient voices understanding of discharge instructions, all belongings given to Patient. Taxi cab is going to be transporting back to group home.

## 2015-09-25 NOTE — Consult Note (Signed)
[facility] Consult   Reason for Consult:  Suicidal ideation Referring Physician:  EDP [name] MRN:  [id number] Principal Diagnosis: Schizoaffective disorder bipolar type Diagnosis:   Patient Active Problem List   Diagnosis Date Noted  . Schizoaffective disorder, bipolar type ([place]) [F25.0]   . Schizoaffective disorder ([place]) [F25.9] 06/17/2015  . 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 GERD (gastroesophageal reflux disease) [K21.9] 09/24/2014  . COPD (chronic obstructive pulmonary disease) ([place]) [J44.9] 09/24/2014  . Borderline personality disorder [F60.3] 09/24/2014  . Cerebral palsy ([place]) [G80.9] 08/31/2014    Total Time spent with patient: 1 hour  Subjective:   [name] is a 50 y.o. female patient admitted to suicidal ideations and was brought on an involuntary commitment from the group home.  HPI   Patient is a 50 year old female with history of schizoaffective disorder and mild intellectual disability and borderline personality disorder. She is well known to our service as she has history of multiple visits to the emergency department. Patient presented to the emergency room as she was having suicidal ideations with a plan to cut herself with a razor blade. She reported that she is upset with her boyfriend who is not talking to her in the group home. Patient reported that she has been giving him $5 every month so she can be nice to him. Patient stated that she has been feeling sad and despondent as he talks to her roommate and other ladies in the group home but has been ignoring her. Patient was noted to be rocking back and forth and shifting herself during the interview. She reported that Dr. Teressa Green  and has recently changed her medications and discontinued the Abilify and started her on the Risperdal.  Patient was noted to be feeling restless and was shifting her position in the chair. She reported that she has in  her feeling of restlessness since her medication have been adjusted and she cannot sit still most of the time.  She reported that she thought about killing herself with a razor blade but did not attempt and was brought to the hospital. She currently denied using any drugs or alcohol. She smokes about 2-3 cigarettes per day. She denied having any perceptual disturbances at this time. She denied having any suicidal ideations or plans at this time and was able to contract for safety.  Past Psychiatric History:   Patient has history of depression and borderline personality disorder and mild intellectual disability. She has history of multiple hospitalizations for suicidal thoughts. She has been admitted to the inpatient behavioral health unit in March. Patient has history of prior suicide attempts by overdose many years ago.  Risk to Self: Suicidal Ideation: Yes-Currently Present Suicidal Intent: No Is patient at risk for suicide?: No Suicidal Plan?: Yes-Currently Present Specify Current Suicidal Plan: Cut self Access to Means: No What has been your use of drugs/alcohol within the last 12 months?: Reports of none How many times?: 0 Other Self Harm Risks: Reports of none Triggers for Past Attempts: None known Intentional Self Injurious Behavior: None Risk to Others: Homicidal Ideation: No Thoughts of Harm to Others: No Current Homicidal Intent: No Current Homicidal Plan: No Access to Homicidal Means: No Identified Victim: Reports of none History of harm to others?: No Assessment of Violence: None Noted Violent Behavior Description: Reports of none Does patient have access to weapons?: No Criminal Charges Pending?: No Does patient have a court date: No Prior Inpatient Therapy: Prior Inpatient Therapy: Yes Prior Therapy Dates:  Multiple Inpatient Admissions Prior Therapy Facilty/Provider(s): [facility] Reason for Treatment: Depression & SI Prior Outpatient Therapy: Prior Outpatient Therapy:  Yes Prior Therapy Dates: Currently Prior Therapy Facilty/Provider(s): [facility] Reason for Treatment: Depression Does patient have an ACCT team?: No Does patient have Intensive In-House Services?  : No Does patient have [facility] services? : No Does patient have P4CC services?: No  Past Medical History:  Past Medical History  Diagnosis Date  . Asthma   . Anxiety   . GERD (gastroesophageal reflux disease)   . Mild cognitive impairment   . Cerebral palsy ([place])   . Borderline personality disorder   . Depression   . Wound abscess   . Schizoaffective disorder [facility])     Past Surgical History  Procedure Laterality Date  . Cholecystectomy    . Tonsillectomy    . Incision and drainage abscess Right 01/02/2015    Procedure: INCISION AND DRAINAGE ABSCESS;  Surgeon: Dia Crawford III, MD;  Location: [facility] ORS;  Service: General;  Laterality: Right;   Family History:  Family History  Problem Relation Age of Onset  . Diabetes Mother   . Heart attack Father    Family Psychiatric  History:   Does not have any family history of psychiatric or mental illness   Social History:  History  Alcohol Use No     History  Drug Use No    Social History   Social History  . Marital Status: Single    Spouse Name: N/A  . Number of Children: N/A  . Years of Education: N/A   Social History Main Topics  . Smoking status: Never Smoker   . Smokeless tobacco: Never Used  . Alcohol Use: No  . Drug Use: No  . Sexual Activity: Yes    Birth Control/ Protection: Implant   Other Topics Concern  . None   Social History Narrative   Additional Social History:  Patient currently lives in a group home. She has been there for the past 7 months. She does not like the neighborhood. She is currently divorced. She does not have any children. She currently receives disability and does not have a legal guardian. She has completed 10th grade of education.  Asian currently follows Dr. Philis Fendt on a  regular basis.  Allergies:   Allergies  Allergen Reactions  . Penicillins Other (See Comments)    Reaction:  Unknown     Labs:  Results for orders placed or performed during the hospital encounter of 09/24/15 (from the past 48 hour(s))  Comprehensive metabolic panel     Status: Abnormal   Collection Time: 09/24/15  5:20 PM  Result Value Ref Range   Sodium 139 135 - 145 mmol/L   Potassium 3.5 3.5 - 5.1 mmol/L   Chloride 105 101 - 111 mmol/L   CO2 28 22 - 32 mmol/L   Glucose, Bld 103 (H) 65 - 99 mg/dL   BUN 18 6 - 20 mg/dL   Creatinine, Ser 0.79 0.44 - 1.00 mg/dL   Calcium 9.0 8.9 - 10.3 mg/dL   Total Protein 6.8 6.5 - 8.1 g/dL   Albumin 3.7 3.5 - 5.0 g/dL   AST 36 15 - 41 U/L   ALT 30 14 - 54 U/L   Alkaline Phosphatase 45 38 - 126 U/L   Total Bilirubin 0.4 0.3 - 1.2 mg/dL   GFR calc non Af Amer >60 >60 mL/min   GFR calc Af Amer >60 >60 mL/min    Comment: (NOTE) The  eGFR has been calculated using the CKD EPI equation. This calculation has not been validated in all clinical situations. eGFR's persistently <60 mL/min signify possible Chronic Kidney Disease.    Anion gap 6 5 - 15  Ethanol     Status: None   Collection Time: 09/24/15  5:20 PM  Result Value Ref Range   Alcohol, Ethyl (B) <5 <5 mg/dL    Comment:        LOWEST DETECTABLE LIMIT FOR SERUM ALCOHOL IS 5 mg/dL FOR MEDICAL PURPOSES ONLY   Salicylate level     Status: None   Collection Time: 09/24/15  5:20 PM  Result Value Ref Range   Salicylate Lvl <8.2 2.8 - 30.0 mg/dL  Acetaminophen level     Status: Abnormal   Collection Time: 09/24/15  5:20 PM  Result Value Ref Range   Acetaminophen (Tylenol), Serum <10 (L) 10 - 30 ug/mL    Comment:        THERAPEUTIC CONCENTRATIONS VARY SIGNIFICANTLY. A RANGE OF 10-30 ug/mL MAY BE AN EFFECTIVE CONCENTRATION FOR MANY PATIENTS. HOWEVER, SOME ARE BEST TREATED AT CONCENTRATIONS OUTSIDE THIS RANGE. ACETAMINOPHEN CONCENTRATIONS >150 ug/mL AT 4 HOURS AFTER INGESTION AND  >50 ug/mL AT 12 HOURS AFTER INGESTION ARE OFTEN ASSOCIATED WITH TOXIC REACTIONS.   cbc     Status: None   Collection Time: 09/24/15  5:20 PM  Result Value Ref Range   WBC 6.3 3.6 - 11.0 K/uL   RBC 4.01 3.80 - 5.20 MIL/uL   Hemoglobin 13.0 12.0 - 16.0 g/dL   HCT 37.0 35.0 - 47.0 %   MCV 92.3 80.0 - 100.0 fL   MCH 32.5 26.0 - 34.0 pg   MCHC 35.2 32.0 - 36.0 g/dL   RDW 13.9 11.5 - 14.5 %   Platelets 260 150 - 440 K/uL  Urine Drug Screen, Qualitative     Status: Abnormal   Collection Time: 09/24/15  6:11 PM  Result Value Ref Range   Tricyclic, Ur Screen NONE DETECTED NONE DETECTED   Amphetamines, Ur Screen NONE DETECTED NONE DETECTED   MDMA (Ecstasy)Ur Screen POSITIVE (A) NONE DETECTED   Cocaine Metabolite,Ur [place] NONE DETECTED NONE DETECTED   Opiate, Ur Screen NONE DETECTED NONE DETECTED   Phencyclidine (PCP) Ur S NONE DETECTED NONE DETECTED   Cannabinoid 50 Ng, Ur  NONE DETECTED NONE DETECTED   Barbiturates, Ur Screen NONE DETECTED NONE DETECTED   Benzodiazepine, Ur Scrn POSITIVE (A) NONE DETECTED   Methadone Scn, Ur NONE DETECTED NONE DETECTED    Comment: (NOTE) 800  Tricyclics, urine               Cutoff 1000 ng/mL 200  Amphetamines, urine             Cutoff 1000 ng/mL 300  MDMA (Ecstasy), urine           Cutoff 500 ng/mL 400  Cocaine Metabolite, urine       Cutoff 300 ng/mL 500  Opiate, urine                   Cutoff 300 ng/mL 600  Phencyclidine (PCP), urine      Cutoff 25 ng/mL 700  Cannabinoid, urine              Cutoff 50 ng/mL 800  Barbiturates, urine             Cutoff 200 ng/mL 900  Benzodiazepine, urine           Cutoff 200 ng/mL 1000  Methadone, urine                Cutoff 300 ng/mL 1100 1200 The urine drug screen provides only a preliminary, unconfirmed 1300 analytical test result and should not be used for non-medical 1400 purposes. Clinical consideration and professional judgment should 1500 be applied to any positive drug screen result due to possible 1600  interfering substances. A more specific alternate chemical method 1700 must be used in order to obtain a confirmed analytical result.  1800 Gas chromato graphy / mass spectrometry (GC/MS) is the preferred 1900 confirmatory method.     Current Facility-Administered Medications  Medication Dose Route Frequency Provider Last Rate Last Dose  . amantadine (SYMMETREL) capsule 100 mg  100 mg Oral BID Rainey Pines, MD      . aspirin EC tablet 81 mg  81 mg Oral Daily Lavonia Drafts, MD   81 mg at 09/25/15 1015  . dicyclomine (BENTYL) tablet 20 mg  20 mg Oral TID PRN Lavonia Drafts, MD      . divalproex (DEPAKOTE ER) 24 hr tablet 500 mg  500 mg Oral BID Lavonia Drafts, MD   500 mg at 09/25/15 1015  . escitalopram (LEXAPRO) tablet 30 mg  30 mg Oral Daily Lavonia Drafts, MD   30 mg at 09/25/15 1015  . hydrOXYzine (ATARAX/VISTARIL) tablet 50 mg  50 mg Oral QHS Lavonia Drafts, MD   50 mg at 09/24/15 2226  . LORazepam (ATIVAN) tablet 1 mg  1 mg Oral BID PRN Lavonia Drafts, MD      . risperiDONE (RISPERDAL) tablet 3 mg  3 mg Oral QHS Rainey Pines, MD       Current Outpatient Prescriptions  Medication Sig Dispense Refill  . acetaminophen (TYLENOL) 500 MG tablet Take 500 mg by mouth every 8 (eight) hours as needed.    Marland Kitchen amantadine (SYMMETREL) 100 MG capsule Take 100 mg by mouth daily.    Marland Kitchen aspirin EC 81 MG tablet Take 81 mg by mouth daily.    Marland Kitchen dicyclomine (BENTYL) 20 MG tablet Take 20 mg by mouth 3 (three) times daily as needed for spasms.    . diphenoxylate-atropine (LOMOTIL) 2.5-0.025 MG tablet Take 1 tablet by mouth 4 (four) times daily as needed for diarrhea or loose stools.    . divalproex (DEPAKOTE ER) 250 MG 24 hr tablet Take 500 mg by mouth 2 (two) times daily.    Marland Kitchen escitalopram (LEXAPRO) 20 MG tablet Take 30 mg by mouth daily.    . fenofibrate 54 MG tablet Take 54 mg by mouth daily.    . Fluticasone-Salmeterol (ADVAIR) 100-50 MCG/DOSE AEPB Inhale 1 puff into the lungs 2 (two) times daily.     .  hydrOXYzine (ATARAX/VISTARIL) 50 MG tablet Take 50 mg by mouth at bedtime.    Marland Kitchen LORazepam (ATIVAN) 1 MG tablet Take 1 mg by mouth 2 (two) times daily as needed for anxiety.    . meloxicam (MOBIC) 15 MG tablet Take 15 mg by mouth daily as needed.     . miconazole (MICOTIN) 2 % powder Apply 1 application topically 2 (two) times daily as needed for itching.    . pantoprazole (PROTONIX) 40 MG tablet Take 40 mg by mouth 2 (two) times daily.    . QUEtiapine (SEROQUEL) 50 MG tablet Take 25 mg by mouth 4 (four) times daily as needed.    . risperidone (RISPERDAL) 4 MG tablet Take 4 mg by mouth at bedtime.    . traMADol (ULTRAM) 50 MG  tablet Take by mouth every 6 (six) hours as needed.      Musculoskeletal: Strength & Muscle Tone: within normal limits Gait & Station: normal Patient leans: N/A  Psychiatric Specialty Exam: Physical Exam  Review of Systems  Psychiatric/Behavioral: Positive for depression and hallucinations. The patient is nervous/anxious and has insomnia.   All other systems reviewed and are negative.   Blood pressure 113/77, pulse 70, temperature 98 F (36.7 C), temperature source Oral, resp. rate 18, height _0  (1.626 m), weight 194 lb (87.998 kg), SpO2 98 %.Body mass index is 33.28 kg/(m^2).  General Appearance: Casual  Eye Contact:  Fair  Speech:  Clear and Coherent  Volume:  Normal  Mood:  Anxious  Affect:  Appropriate  Thought Process:  Goal Directed  Orientation:  Full (Time, Place, and Person)  Thought Content:  WDL  Suicidal Thoughts:  No  Homicidal Thoughts:  No  Memory:  Immediate;   Fair Recent;   Fair Remote;   Fair  Judgement:  Impaired  Insight:  Fair  Psychomotor Activity:  Normal  Concentration:  Concentration: Fair and Attention Span: Fair  Recall:  [employer] of Knowledge:  Fair  Language:  Fair  Akathisia:  Yes  Handed:  Right  AIMS (if indicated):     Assets:  Communication Skills Desire for Improvement Social Support  ADL's:  Intact   Cognition:  WNL  Sleep:        Treatment Plan Summary: Medication management  I will decrease Risperdal 3 mg by mouth daily at bedtime I will increase amantadine 100 mg by mouth twice a day as patient is currently experiencing akathisia and adverse reaction from the Risperdal Patient will be released from the involuntary commitment and discharged back to the group home. She will continue to follow-up with Dr. Philis Fendt Patient contracted for safety and does not have any suicidal homicidal ideations or plans   Disposition: No evidence of imminent risk to self or others at present.   Patient does not meet criteria for psychiatric inpatient admission. Discussed crisis plan, support from social network, calling 911, coming to the Emergency Department, and calling Suicide Hotline.   Thank  you for allowing me to participate in the care of this patient      This note was generated in part or whole with voice recognition software. Voice regonition is usually quite accurate but there are transcription errors that can and very often do occur. I apologize for any typographical errors that were not detected and corrected.    Rainey Pines, MD 09/25/2015 12:09 PM

## 2015-09-28 ENCOUNTER — Emergency Department
Admission: EM | Admit: 2015-09-28 | Discharge: 2015-09-28 | Disposition: A | Discharge status: Home / Self Care | Payer: Medicare Other | Attending: Emergency Medicine | Admitting: Emergency Medicine

## 2015-09-28 ENCOUNTER — Encounter: Payer: Self-pay | Admitting: Medical Oncology

## 2015-09-28 DIAGNOSIS — R45851 Suicidal ideations: Secondary | ICD-10-CM | POA: Insufficient documentation

## 2015-09-28 DIAGNOSIS — F70 Mild intellectual disabilities: Secondary | ICD-10-CM | POA: Diagnosis present

## 2015-09-28 DIAGNOSIS — J45909 Unspecified asthma, uncomplicated: Secondary | ICD-10-CM | POA: Insufficient documentation

## 2015-09-28 DIAGNOSIS — G809 Cerebral palsy, unspecified: Secondary | ICD-10-CM | POA: Diagnosis present

## 2015-09-28 DIAGNOSIS — Z7982 Long term (current) use of aspirin: Secondary | ICD-10-CM | POA: Insufficient documentation

## 2015-09-28 DIAGNOSIS — J449 Chronic obstructive pulmonary disease, unspecified: Secondary | ICD-10-CM | POA: Diagnosis not present

## 2015-09-28 DIAGNOSIS — F329 Major depressive disorder, single episode, unspecified: Secondary | ICD-10-CM | POA: Insufficient documentation

## 2015-09-28 DIAGNOSIS — Z79899 Other long term (current) drug therapy: Secondary | ICD-10-CM | POA: Diagnosis not present

## 2015-09-28 DIAGNOSIS — F603 Borderline personality disorder: Secondary | ICD-10-CM | POA: Insufficient documentation

## 2015-09-28 DIAGNOSIS — Z8719 Personal history of other diseases of the digestive system: Secondary | ICD-10-CM | POA: Insufficient documentation

## 2015-09-28 DIAGNOSIS — F250 Schizoaffective disorder, bipolar type: Secondary | ICD-10-CM | POA: Diagnosis not present

## 2015-09-28 LAB — COMPREHENSIVE METABOLIC PANEL
ALBUMIN: 3.5 g/dL (ref 3.5–5.0)
ALT: 27 U/L (ref 14–54)
AST: 36 U/L (ref 15–41)
Alkaline Phosphatase: 47 U/L (ref 38–126)
Anion gap: 8 (ref 5–15)
BUN: 17 mg/dL (ref 6–20)
CHLORIDE: 105 mmol/L (ref 101–111)
CO2: 29 mmol/L (ref 22–32)
CREATININE: 0.81 mg/dL (ref 0.44–1.00)
Calcium: 9 mg/dL (ref 8.9–10.3)
GFR calc non Af Amer: >60 mL/min (ref >60)
GLUCOSE: 73 mg/dL (ref 65–99)
Potassium: 3.9 mmol/L (ref 3.5–5.1)
SODIUM: 142 mmol/L (ref 135–145)
Total Bilirubin: 0.6 mg/dL (ref 0.3–1.2)
Total Protein: 6.6 g/dL (ref 6.5–8.1)

## 2015-09-28 LAB — CBC
HEMATOCRIT: 38.6 % (ref 35.0–47.0)
HEMOGLOBIN: 13.2 g/dL (ref 12.0–16.0)
MCH: 32.1 pg (ref 26.0–34.0)
MCHC: 34 g/dL (ref 32.0–36.0)
MCV: 94.2 fL (ref 80.0–100.0)
Platelets: 243 10*3/uL (ref 150–440)
RBC: 4.1 MIL/uL (ref 3.80–5.20)
RDW: 13.9 % (ref 11.5–14.5)
WBC: 6.4 10*3/uL (ref 3.6–11.0)

## 2015-09-28 LAB — SALICYLATE LEVEL

## 2015-09-28 LAB — ACETAMINOPHEN LEVEL: Acetaminophen (Tylenol), Serum: <10 ug/mL — ABNORMAL LOW (ref 10–30)

## 2015-09-28 LAB — ETHANOL: Alcohol, Ethyl (B): <5 mg/dL (ref <5)

## 2015-09-28 NOTE — Discharge Instructions (Signed)
Please return to the emergency department if you develop thoughts of hurting herself or anyone else, hallucinations, or any other symptoms concerning to you. °

## 2015-09-28 NOTE — ED Notes (Signed)
BEHAVIORAL HEALTH ROUNDING Patient sleeping: No. Patient alert and oriented: yes Behavior appropriate: Yes.  ; If no, describe:  Nutrition and fluids offered: Yes  Toileting and hygiene offered: Yes  Sitter present: yes Law enforcement present: Yes  

## 2015-09-28 NOTE — ED Notes (Signed)
Patient reports to this RN, "I just don't care anymore."  Patient has cerebral palsy and cognitive impairment.  Patient states she had a razor and was going to cut herself and staff stopped her.  Patient also reports a plan to step in front of a bus.  Patient told this RN, "If you send me back there to that place I will kill myself."  Patient states she would be happy to live somewhere else, [place] [place], [place]  Patient is alert and behavior is appropriate at this time.

## 2015-09-28 NOTE — ED Notes (Signed)
This RN reviewed discharge papers and instructions with patient.  Patient contracting for safety.  Suicide hotline numbers given.  Patient verbalized understanding.  Patient willing to go back to group home at this time.

## 2015-09-28 NOTE — ED Notes (Signed)
BEHAVIORAL HEALTH ROUNDING Patient sleeping: No. Patient alert and oriented: yes Behavior appropriate: Yes.  ; If no, describe:  Nutrition and fluids offered: Yes  Toileting and hygiene offered: Yes  Sitter present: q 15 minute checks Law enforcement present: Yes  

## 2015-09-28 NOTE — ED Provider Notes (Signed)
[facility] Emergency Department Provider Note  ____________________________________________  Time seen: Approximately 12:56 PM  I have reviewed the triage vital signs and the nursing notes.   HISTORY  Chief Complaint Suicidal    HPI [name] is a 50 y.o. female with a history of cerebral palsy, cognitive impairment, borderline personality disorder and depression, schizoaffective disorder, coming from her group home for suicidal ideations. The patient states that she ends unhappy there, like to slit her wrists with a knife. She also states that she will jump in front of a bus. The patient denies any medical complaints at this time, homicidal ideations, or hallucinations. The patient reports that she would like to stay here, and when I tell her that she cannot live here, she reports she would just like to stay 2 days.   Past Medical History  Diagnosis Date  . Asthma   . Anxiety   . GERD (gastroesophageal reflux disease)   . Mild cognitive impairment   . Cerebral palsy (HCC)   . Borderline personality disorder   . Depression   . Wound abscess   . Schizoaffective disorder [facility](HCC)     Patient Active Problem List   Diagnosis Date Noted  . Schizoaffective disorder, bipolar type (HCC)   . Schizoaffective disorder (HCC) 06/17/2015  . 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 GERD (gastroesophageal reflux disease) 09/24/2014  . COPD (chronic obstructive pulmonary disease) (HCC) 09/24/2014  . Borderline personality disorder 09/24/2014  . Cerebral palsy (HCC) 08/31/2014    Past Surgical History  Procedure Laterality Date  . Cholecystectomy    . Tonsillectomy    . Incision and drainage abscess Right 01/02/2015    Procedure: INCISION AND DRAINAGE ABSCESS;  Surgeon: Tiney Rougealph Ely III, MD;  Location: [facility] ORS;  Service: General;  Laterality: Right;    Current Outpatient Rx  Name  Route  Sig  Dispense  Refill  . acetaminophen (TYLENOL) 500 MG tablet  Oral   Take 500 mg by mouth every 8 (eight) hours as needed.         Marland Kitchen. amantadine (SYMMETREL) 100 MG capsule   Oral   Take 1 capsule (100 mg total) by mouth 2 (two) times daily.   60 capsule   0   . aspirin EC 81 MG tablet   Oral   Take 81 mg by mouth daily.         Marland Kitchen. dicyclomine (BENTYL) 20 MG tablet   Oral   Take 20 mg by mouth 3 (three) times daily as needed for spasms.         . diphenoxylate-atropine (LOMOTIL) 2.5-0.025 MG tablet   Oral   Take 1 tablet by mouth 4 (four) times daily as needed for diarrhea or loose stools.         . divalproex (DEPAKOTE ER) 250 MG 24 hr tablet   Oral   Take 500 mg by mouth 2 (two) times daily.         Marland Kitchen. escitalopram (LEXAPRO) 20 MG tablet   Oral   Take 30 mg by mouth daily.         . fenofibrate 54 MG tablet   Oral   Take 54 mg by mouth daily.         . Fluticasone-Salmeterol (ADVAIR) 100-50 MCG/DOSE AEPB   Inhalation   Inhale 1 puff into the lungs 2 (two) times daily.          . hydrOXYzine (ATARAX/VISTARIL) 50 MG tablet   Oral   Take  50 mg by mouth at bedtime.         Marland Kitchen LORazepam (ATIVAN) 1 MG tablet   Oral   Take 1 mg by mouth 2 (two) times daily as needed for anxiety.         . meloxicam (MOBIC) 15 MG tablet   Oral   Take 15 mg by mouth daily as needed.          . miconazole (MICOTIN) 2 % powder   Topical   Apply 1 application topically 2 (two) times daily as needed for itching.         . pantoprazole (PROTONIX) 40 MG tablet   Oral   Take 40 mg by mouth 2 (two) times daily.         . QUEtiapine (SEROQUEL) 50 MG tablet   Oral   Take 25 mg by mouth 4 (four) times daily as needed.         . risperiDONE (RISPERDAL) 3 MG tablet   Oral   Take 1 tablet (3 mg total) by mouth at bedtime.   30 tablet   0   . traMADol (ULTRAM) 50 MG tablet   Oral   Take by mouth every 6 (six) hours as needed.           Allergies Penicillins  Family History  Problem Relation Age of Onset  .  Diabetes Mother   . Heart attack Father     Social History Social History  Substance Use Topics  . Smoking status: Never Smoker   . Smokeless tobacco: Never Used  . Alcohol Use: No    Review of Systems Constitutional: No fever/chills.No lightheadedness or syncope. Eyes: No visual changes. ENT: No sore throat. No congestion or rhinorrhea. Cardiovascular: Denies chest pain. Denies palpitations. Respiratory: Denies shortness of breath.  No cough. Gastrointestinal: No abdominal pain.  No nausea, no vomiting.  No diarrhea.  No constipation. Genitourinary: Negative for dysuria. Musculoskeletal: Negative for back pain. Skin: Negative for rash. Neurological: Negative for headaches. No focal numbness, tingling or weakness.  Psychiatric:Positive suicidal ideation. Negative homicidal ideation. Negative hallucinations.  10-point ROS otherwise negative.  ____________________________________________   PHYSICAL EXAM:  VITAL SIGNS: ED Triage Vitals  Enc Vitals Group     BP 09/28/15 1002 101/75 mmHg     Pulse Rate 09/28/15 1002 79     Resp 09/28/15 1002 17     Temp 09/28/15 1002 98.6 F (37 C)     Temp Source 09/28/15 1002 Oral     SpO2 09/28/15 1002 97 %     Weight 09/28/15 1002 194 lb (87.998 kg)     Height 09/28/15 1002 5\' 4"  (1.626 m)     Head Cir --      Peak Flow --      Pain Score --      Pain Loc --      Pain Edu? --      Excl. in GC? --     Constitutional: Alert and oriented. Well appearing and in no acute distress. Answers questions appropriately. Eyes: Conjunctivae are normal.  EOMI. No scleral icterus. Head: Atraumatic. Nose: No congestion/rhinnorhea. Mouth/Throat: Mucous membranes are moist.  Neck: No stridor.  Supple.  No meningismus. Cardiovascular: Normal rate, regular rhythm. No murmurs, rubs or gallops.  Respiratory: Normal respiratory effort.  No accessory muscle use or retractions. Lungs CTAB.  No wheezes, rales or ronchi. Gastrointestinal: Soft,  nontender and nondistended.  No guarding or rebound.  No peritoneal signs. Musculoskeletal: No LE  edema.  Neurologic:  Alert and able to answer questions appropriately. Has some mild cognitive impairment. Speech is clear.  Face and smile are symmetric.  EOMI.  Moves all extremities well. Skin:  Skin is warm, dry and intact. No rash noted. Psychiatric: Mood and affect are normal. Mild cognitive impairment and difficulty with insight. Reports suicidal ideation.  ____________________________________________   LABS (all labs ordered are listed, but only abnormal results are displayed)  Labs Reviewed  ACETAMINOPHEN LEVEL - Abnormal; Notable for the following:    Acetaminophen (Tylenol), Serum <10 (*)    All other components within normal limits  COMPREHENSIVE METABOLIC PANEL  ETHANOL  SALICYLATE LEVEL  CBC  URINE DRUG SCREEN, QUALITATIVE ([facility] ONLY)  POC URINE PREG, ED   ____________________________________________  EKG  Not indicated ____________________________________________  RADIOLOGY  No results found.  ____________________________________________   PROCEDURES  Procedure(s) performed: None  Critical Care performed: No ____________________________________________   INITIAL IMPRESSION / ASSESSMENT AND PLAN / ED COURSE  Pertinent labs & imaging results that were available during my care of the patient were reviewed by me and considered in my medical decision making (see chart for details).  50 y.o. female with a history of cognitive impairment and multiple psychiatric illnesses presenting with suicidal ideation. The patient is medically cleared for psychiatric disposition. She is well-known to Dr. Delaney Meigs, the psychiatrist on-call.  ----------------------------------------- 1:25 PM on 09/28/2015 -----------------------------------------  I have spoken to Dr. Delaney Meigs who has seen and evaluated the patient.  She is at her baseline. She is regularly seen in the  emergency department when she is unhappy with her group home, and states that she was suicidal. However, she is at her baseline today, and has a  good outpatient team. We will plan to discharge the patient from the hospital. She understands return precautions as well as follow-up instructions.  ____________________________________________  FINAL CLINICAL IMPRESSION(S) / ED DIAGNOSES  Final diagnoses:  Suicidal ideation      NEW MEDICATIONS STARTED DURING THIS VISIT:  New Prescriptions   No medications on file     Rockne Menghini, MD 09/28/15 1326

## 2015-09-28 NOTE — Consult Note (Signed)
Sherri Psychiatry Consult   Reason for Consult:  Consult for this 50 year old woman well known from multiple visits to the emergency room Referring Physician:  Mariea Clonts [name] MRN:  [id number] Principal Diagnosis: Borderline personality disorder Diagnosis:   Patient Active Problem List   Diagnosis Date Noted  . Schizoaffective disorder, bipolar type ([place]) [F25.0]   . Schizoaffective disorder ([place]) [F25.9] 06/17/2015  . 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 GERD (gastroesophageal reflux disease) [K21.9] 09/24/2014  . COPD (chronic obstructive pulmonary disease) ([place]) [J44.9] 09/24/2014  . Borderline personality disorder [F60.3] 09/24/2014  . Cerebral palsy ([place]) [G80.9] 08/31/2014    Total Time spent with patient: 45 minutes  Subjective:   Vayda Green is a 50 y.o. female patient admitted with "I will not go back to the group home".   HPI:  Patient interviewed. Labs and vitals reviewed. Old notes reviewed and patient familiar to me from multiple prior encounters. She had her self brought over here to the hospital saying that she was upset her mood is bad and that she was having some vague suicidal thoughts. She talks to me about how she is going to get a razor and cut herself on the wrist or walk out into traffic. All of these sound like pretty poorly formed plans and not something she seriously intends to do. Her main complaint is not wanting to go back to her group home. As usual she says that she dislikes the people at the group home and dislikes the way she is treated and is specifically asking that we find her a new group home. Her mood is always dysphoric. She is somewhat vague about hallucinations but does not appear to be responding to internal stimuli. She says she has been compliant with her current medicines and has no new medical complaints.  Social history: Currently residing at a group home which she is constantly  complaining about. She's been to multiple group homes in the same thing has happened at all of them that she complains and has her self brought back into the hospital. I don't think there is any family really actively involved with her although her brother will occasionally take her over to [facility] to see the elderly mother. Patient is her own legal guardian.  Medical history: History of gastric reflux and COPD. History of cerebral palsy.  Substance abuse history: No history of drug or alcohol abuse.  Past Psychiatric History: Patient has a history of schizoaffective disorder and had a lot of behavior problems. She's had wrist cutting in the past. She's had several hospitalizations. She tends to have her self brought to the emergency room frequently always with the same mental status and the same complaints. Carries a diagnosis of schizoaffective disorder and also mild intellectual disability and cerebral palsy. Not have much likelihood of benefiting from hospitalization as shown by the lengthy hospital stay she has had in the past with no better improvement than what she has now.  Risk to Self: Is patient at risk for suicide?: Yes Risk to Others:   Prior Inpatient Therapy:   Prior Outpatient Therapy:    Past Medical History:  Past Medical History  Diagnosis Date  . Asthma   . Anxiety   . GERD (gastroesophageal reflux disease)   . Mild cognitive impairment   . Cerebral palsy ([place])   . Borderline personality disorder   . Depression   . Wound abscess   . Schizoaffective disorder [facility])     Past Surgical History  Procedure Laterality Date  . Cholecystectomy    . Tonsillectomy    . Incision and drainage abscess Right 01/02/2015    Procedure: INCISION AND DRAINAGE ABSCESS;  Surgeon: Dia Crawford III, MD;  Location: [facility] ORS;  Service: General;  Laterality: Right;   Family History:  Family History  Problem Relation Age of Onset  . Diabetes Mother   . Heart attack Father    Family  Psychiatric  History: There is no known family history of mental health problems that the patient can describe Social History:  History  Alcohol Use No     History  Drug Use No    Social History   Social History  . Marital Status: Single    Spouse Name: N/A  . Number of Children: N/A  . Years of Education: N/A   Social History Main Topics  . Smoking status: Never Smoker   . Smokeless tobacco: Never Used  . Alcohol Use: No  . Drug Use: No  . Sexual Activity: Yes    Birth Control/ Protection: Implant   Other Topics Concern  . None   Social History Narrative   Additional Social History:    Allergies:   Allergies  Allergen Reactions  . Penicillins Other (See Comments)    Reaction:  Unknown     Labs:  Results for orders placed or performed during the hospital encounter of 09/28/15 (from the past 48 hour(s))  Comprehensive metabolic panel     Status: None   Collection Time: 09/28/15 10:04 AM  Result Value Ref Range   Sodium 142 135 - 145 mmol/L   Potassium 3.9 3.5 - 5.1 mmol/L   Chloride 105 101 - 111 mmol/L   CO2 29 22 - 32 mmol/L   Glucose, Bld 73 65 - 99 mg/dL   BUN 17 6 - 20 mg/dL   Creatinine, Ser 0.81 0.44 - 1.00 mg/dL   Calcium 9.0 8.9 - 10.3 mg/dL   Total Protein 6.6 6.5 - 8.1 g/dL   Albumin 3.5 3.5 - 5.0 g/dL   AST 36 15 - 41 U/L   ALT 27 14 - 54 U/L   Alkaline Phosphatase 47 38 - 126 U/L   Total Bilirubin 0.6 0.3 - 1.2 mg/dL   GFR calc non Af Amer >60 >60 mL/min   GFR calc Af Amer >60 >60 mL/min    Comment: (NOTE) The eGFR has been calculated using the CKD EPI equation. This calculation has not been validated in all clinical situations. eGFR's persistently <60 mL/min signify possible Chronic Kidney Disease.    Anion gap 8 5 - 15  Ethanol     Status: None   Collection Time: 09/28/15 10:04 AM  Result Value Ref Range   Alcohol, Ethyl (B) <5 <5 mg/dL    Comment:        LOWEST DETECTABLE LIMIT FOR SERUM ALCOHOL IS 5 mg/dL FOR MEDICAL PURPOSES  ONLY   Salicylate level     Status: None   Collection Time: 09/28/15 10:04 AM  Result Value Ref Range   Salicylate Lvl <4.6 2.8 - 30.0 mg/dL  Acetaminophen level     Status: Abnormal   Collection Time: 09/28/15 10:04 AM  Result Value Ref Range   Acetaminophen (Tylenol), Serum <10 (L) 10 - 30 ug/mL    Comment:        THERAPEUTIC CONCENTRATIONS VARY SIGNIFICANTLY. A RANGE OF 10-30 ug/mL MAY BE AN EFFECTIVE CONCENTRATION FOR MANY PATIENTS. HOWEVER, SOME ARE BEST TREATED AT CONCENTRATIONS OUTSIDE THIS RANGE. ACETAMINOPHEN  CONCENTRATIONS >150 ug/mL AT 4 HOURS AFTER INGESTION AND >50 ug/mL AT 12 HOURS AFTER INGESTION ARE OFTEN ASSOCIATED WITH TOXIC REACTIONS.   cbc     Status: None   Collection Time: 09/28/15 10:04 AM  Result Value Ref Range   WBC 6.4 3.6 - 11.0 K/uL   RBC 4.10 3.80 - 5.20 MIL/uL   Hemoglobin 13.2 12.0 - 16.0 g/dL   HCT 38.6 35.0 - 47.0 %   MCV 94.2 80.0 - 100.0 fL   MCH 32.1 26.0 - 34.0 pg   MCHC 34.0 32.0 - 36.0 g/dL   RDW 13.9 11.5 - 14.5 %   Platelets 243 150 - 440 K/uL    No current facility-administered medications for this encounter.   Current Outpatient Prescriptions  Medication Sig Dispense Refill  . acetaminophen (TYLENOL) 500 MG tablet Take 500 mg by mouth every 8 (eight) hours as needed.    Marland Kitchen amantadine (SYMMETREL) 100 MG capsule Take 1 capsule (100 mg total) by mouth 2 (two) times daily. 60 capsule 0  . aspirin EC 81 MG tablet Take 81 mg by mouth daily.    Marland Kitchen dicyclomine (BENTYL) 20 MG tablet Take 20 mg by mouth 3 (three) times daily as needed for spasms.    . diphenoxylate-atropine (LOMOTIL) 2.5-0.025 MG tablet Take 1 tablet by mouth 4 (four) times daily as needed for diarrhea or loose stools.    . divalproex (DEPAKOTE ER) 250 MG 24 hr tablet Take 500 mg by mouth 2 (two) times daily.    Marland Kitchen escitalopram (LEXAPRO) 20 MG tablet Take 30 mg by mouth daily.    . fenofibrate 54 MG tablet Take 54 mg by mouth daily.    . Fluticasone-Salmeterol (ADVAIR)  100-50 MCG/DOSE AEPB Inhale 1 puff into the lungs 2 (two) times daily.     . hydrOXYzine (ATARAX/VISTARIL) 50 MG tablet Take 50 mg by mouth at bedtime.    Marland Kitchen LORazepam (ATIVAN) 1 MG tablet Take 1 mg by mouth 2 (two) times daily as needed for anxiety.    . meloxicam (MOBIC) 15 MG tablet Take 15 mg by mouth daily as needed.     . miconazole (MICOTIN) 2 % powder Apply 1 application topically 2 (two) times daily as needed for itching.    . pantoprazole (PROTONIX) 40 MG tablet Take 40 mg by mouth 2 (two) times daily.    . QUEtiapine (SEROQUEL) 50 MG tablet Take 25 mg by mouth 4 (four) times daily as needed.    . risperiDONE (RISPERDAL) 3 MG tablet Take 1 tablet (3 mg total) by mouth at bedtime. 30 tablet 0  . traMADol (ULTRAM) 50 MG tablet Take by mouth every 6 (six) hours as needed.      Musculoskeletal: Strength & Muscle Tone: decreased Gait & Station: normal Patient leans: N/A  Psychiatric Specialty Exam: Physical Exam  Nursing note and vitals reviewed. Constitutional: She appears well-developed and well-nourished.  HENT:  Head: Normocephalic and atraumatic.  Eyes: Conjunctivae are normal. Pupils are equal, round, and reactive to light.  Neck: Normal range of motion.  Cardiovascular: Normal heart sounds.   Respiratory: Effort normal.  GI: Soft.  Musculoskeletal: Normal range of motion.  Neurological: She is alert.  Skin: Skin is warm and dry.  Psychiatric: Her affect is blunt. Her speech is delayed. She is slowed. She expresses impulsivity. She exhibits a depressed mood. She exhibits abnormal remote memory.    Review of Systems  Constitutional: Negative.   HENT: Negative.   Eyes: Negative.   Respiratory:  Negative.   Cardiovascular: Negative.   Gastrointestinal: Negative.   Musculoskeletal: Negative.   Skin: Negative.   Neurological: Negative.   Psychiatric/Behavioral: Positive for depression and suicidal ideas. Negative for hallucinations, memory loss and substance abuse. The  patient is nervous/anxious and has insomnia.     Blood pressure 122/82, pulse 73, temperature 98.4 F (36.9 C), temperature source Oral, resp. rate 15, height '5\' 4"'  (1.626 m), weight 87.998 kg (194 lb), SpO2 97 %.Body mass index is 33.28 kg/(m^2).  General Appearance: Disheveled  Eye Contact:  Fair  Speech:  Slow and Slurred  Volume:  Decreased  Mood:  Dysphoric  Affect:  Labile  Thought Process:  Irrelevant  Orientation:  Full (Time, Place, and Person)  Thought Content:  Obsessions and Rumination  Suicidal Thoughts:  Yes.  without intent/plan  Homicidal Thoughts:  No  Memory:  Immediate;   Good Recent;   Fair Remote;   Fair  Judgement:  Impaired  Insight:  Shallow  Psychomotor Activity:  Decreased  Concentration:  Concentration: Poor  Recall:  [employer] of Knowledge:  Fair  Language:  Fair  Akathisia:  No  Handed:  Right  AIMS (if indicated):     Assets:  Desire for Improvement Housing Physical Health  ADL's:  Intact  Cognition:  Impaired,  Mild  Sleep:        Treatment Plan Summary: Plan Patient presents to the emergency room once again with complaints that revolve around wanting to leave her group home. Her report of suicidal thinking does not suggest that she is actually wanting to die or has any plan or intention of harming herself. We have been through this several times in the past with her. Occasionally she will walk out into [place] but has never actually suffered any damage as a result of it. Her presentation appears to be highly manipulative related to her dislike of her group home. Patient will not be admitted to the psychiatric unit. Case reviewed with emergency room physician. Patient is strongly encouraged to work with her outpatient providers. No change to medication or treatment.  Disposition: Patient does not meet criteria for psychiatric inpatient admission.  Alethia Berthold, MD 09/28/2015 3:00 PM

## 2015-09-28 NOTE — ED Notes (Signed)
Pt comes into the ED via [facility] with thought of SI, states she would cut her wrist or walk into traffic..Marland Kitchen

## 2015-09-28 NOTE — Consult Note (Signed)
  Psychiatry: Breif note. Patient not in need of inpatient treatment. No need for IVC. Can be discharged back to group home.

## 2015-10-01 ENCOUNTER — Emergency Department
Admission: EM | Admit: 2015-10-01 | Discharge: 2015-10-01 | Payer: Medicare Other | Attending: Emergency Medicine | Admitting: Emergency Medicine

## 2015-10-01 ENCOUNTER — Encounter: Payer: Self-pay | Admitting: *Deleted

## 2015-10-01 ENCOUNTER — Emergency Department: Payer: Medicare Other

## 2015-10-01 DIAGNOSIS — R112 Nausea with vomiting, unspecified: Secondary | ICD-10-CM | POA: Diagnosis not present

## 2015-10-01 DIAGNOSIS — Z79899 Other long term (current) drug therapy: Secondary | ICD-10-CM | POA: Insufficient documentation

## 2015-10-01 DIAGNOSIS — J45909 Unspecified asthma, uncomplicated: Secondary | ICD-10-CM | POA: Insufficient documentation

## 2015-10-01 DIAGNOSIS — F329 Major depressive disorder, single episode, unspecified: Secondary | ICD-10-CM | POA: Insufficient documentation

## 2015-10-01 DIAGNOSIS — R1084 Generalized abdominal pain: Secondary | ICD-10-CM | POA: Diagnosis present

## 2015-10-01 DIAGNOSIS — Z7982 Long term (current) use of aspirin: Secondary | ICD-10-CM | POA: Insufficient documentation

## 2015-10-01 DIAGNOSIS — R197 Diarrhea, unspecified: Secondary | ICD-10-CM | POA: Insufficient documentation

## 2015-10-01 LAB — COMPREHENSIVE METABOLIC PANEL
ALBUMIN: 3.3 g/dL — AB (ref 3.5–5.0)
ALT: 33 U/L (ref 14–54)
ANION GAP: 8 (ref 5–15)
AST: 41 U/L (ref 15–41)
Alkaline Phosphatase: 46 U/L (ref 38–126)
BUN: 28 mg/dL — ABNORMAL HIGH (ref 6–20)
CO2: 26 mmol/L (ref 22–32)
Calcium: 8.7 mg/dL — ABNORMAL LOW (ref 8.9–10.3)
Chloride: 106 mmol/L (ref 101–111)
Creatinine, Ser: 1.04 mg/dL — ABNORMAL HIGH (ref 0.44–1.00)
GFR calc Af Amer: >60 mL/min (ref >60)
GFR calc non Af Amer: >60 mL/min (ref >60)
GLUCOSE: 115 mg/dL — AB (ref 65–99)
POTASSIUM: 5.3 mmol/L — AB (ref 3.5–5.1)
SODIUM: 140 mmol/L (ref 135–145)
Total Bilirubin: 0.6 mg/dL (ref 0.3–1.2)
Total Protein: 6.6 g/dL (ref 6.5–8.1)

## 2015-10-01 LAB — URINALYSIS COMPLETE WITH MICROSCOPIC (ARMC ONLY)
Bilirubin Urine: NEGATIVE
Glucose, UA: NEGATIVE mg/dL
NITRITE: NEGATIVE
PROTEIN: 30 mg/dL — AB
SPECIFIC GRAVITY, URINE: 1.027 (ref 1.005–1.030)
pH: 5 (ref 5.0–8.0)

## 2015-10-01 LAB — PREGNANCY, URINE: PREG TEST UR: NEGATIVE

## 2015-10-01 LAB — CBC
HCT: 41.7 % (ref 35.0–47.0)
HEMOGLOBIN: 14.2 g/dL (ref 12.0–16.0)
MCH: 32.5 pg (ref 26.0–34.0)
MCHC: 34.1 g/dL (ref 32.0–36.0)
MCV: 95.5 fL (ref 80.0–100.0)
Platelets: 253 10*3/uL (ref 150–440)
RBC: 4.36 MIL/uL (ref 3.80–5.20)
RDW: 13.7 % (ref 11.5–14.5)
WBC: 9.1 10*3/uL (ref 3.6–11.0)

## 2015-10-01 LAB — LIPASE, BLOOD: Lipase: 19 U/L (ref 11–51)

## 2015-10-01 MED ORDER — HYDROCODONE-ACETAMINOPHEN 5-325 MG PO TABS
ORAL_TABLET | ORAL | Status: AC
  Administered 2015-10-01: 1 via ORAL
  Filled 2015-10-01: qty 1

## 2015-10-01 MED ORDER — SODIUM CHLORIDE 0.9 % IV BOLUS (SEPSIS)
1000.0000 mL | Freq: Once | INTRAVENOUS | Status: AC
  Administered 2015-10-01: 1000 mL via INTRAVENOUS

## 2015-10-01 MED ORDER — HYDROCODONE-ACETAMINOPHEN 5-325 MG PO TABS: 1.0000 | ORAL_TABLET | ORAL | Status: DC | PRN

## 2015-10-01 MED ORDER — LOPERAMIDE HCL 2 MG PO CAPS
2.0000 mg | ORAL_CAPSULE | Freq: Once | ORAL | Status: AC
  Administered 2015-10-01: 2 mg via ORAL
  Filled 2015-10-01: qty 1

## 2015-10-01 MED ORDER — LOPERAMIDE HCL 2 MG PO TABS: 2.0000 mg | ORAL_TABLET | Freq: Four times a day (QID) | ORAL | Status: DC | PRN

## 2015-10-01 MED ORDER — ONDANSETRON HCL 4 MG/2ML IJ SOLN
4.0000 mg | Freq: Once | INTRAMUSCULAR | Status: AC
  Administered 2015-10-01: 4 mg via INTRAVENOUS
  Filled 2015-10-01: qty 2

## 2015-10-01 MED ORDER — HYDROCODONE-ACETAMINOPHEN 5-325 MG PO TABS
1.0000 | ORAL_TABLET | Freq: Once | ORAL | Status: AC
  Administered 2015-10-01: 1 via ORAL

## 2015-10-01 MED ORDER — KETOROLAC TROMETHAMINE 30 MG/ML IJ SOLN
15.0000 mg | Freq: Once | INTRAMUSCULAR | Status: AC
  Administered 2015-10-01: 15 mg via INTRAVENOUS
  Filled 2015-10-01: qty 1

## 2015-10-01 NOTE — Discharge Instructions (Signed)
You have been seen in the emergency department today for nausea, vomiting, diarrhea. CT a large within normal limits besides mild dehydration. Please take your loperamide as needed for diarrhea, as prescribed. Please use your pain medication as needed, as prescribed. Your CT scan has shown several pulmonary nodules. As we discussed please follow-up with your primary care doctor and bring a copy of this discharge summary with the CT report below so your doctor can arrange CT follow-up in 6-12 months for recheck.   CT ABDOMEN AND PELVIS WITHOUT CONTRAST  TECHNIQUE: Multidetector CT imaging of the abdomen and pelvis was performed following the standard protocol without IV contrast.  COMPARISON: Prior radiograph from 01/05/2015.  FINDINGS: 6 mm subpleural nodule present within the right middle lobe (series 4, image 4). Additional 6 mm subpleural left lower lobe nodule (series 4, image 13). Visualized lung bases are otherwise clear. Small pericardial effusion noted.  Limited noncontrast evaluation of the liver is unremarkable. Gallbladder surgically absent. No biliary dilatation. Spleen, adrenal glands, and pancreas demonstrate a normal unenhanced appearance.  Kidneys are equal in size without evidence of nephrolithiasis or hydronephrosis. No radiopaque calculi seen along the course of either renal collecting system. There is no hydroureter.  Stomach mildly distended with fluid density within the gastric lumen. Stomach otherwise unremarkable. No evidence for bowel obstruction. No abnormal wall thickening or inflammatory fat stranding seen about the bowels. Intraluminal fluid density within distal colon, suggestive of active diarrheal illness. Appendix within normal limits.  Bladder partially distended without acute abnormality. Uterus and ovaries within normal limits.  No free air or fluid. No pathologically enlarged intra-abdominal pelvic lymph nodes. These T mesenteric  stranding noted with a few scattered subcentimeter mesenteric lymph nodes.  No acute osseous abnormality. Compression deformity at the L2 vertebral body appears chronic. Chronic bilateral pars defects at L5 with associated 7 mm of spondylolisthesis. No worrisome lytic or blastic osseous lesions.  IMPRESSION: 1. No CT evidence for nephrolithiasis or obstructive uropathy. 2. Intraluminal fluid density within the distal colon, suggestive of active diarrheal illness. 3. Small pericardial effusion. 4. 7 mm chronic spondylolisthesis of L5 on S1. 5. 6 mm right middle and left lower lobe nodules, indeterminate. Non-contrast chest CT at 6-12 months is recommended. If the nodule is stable at time of repeat CT, then future CT at 18-24 months (from today's scan) is considered optional for low-risk patients, but is recommended for high-risk patients. This recommendation follows the consensus statement: Guidelines for Management of Incidental Pulmonary Nodules Detected on CT Images:From the Fleischner Society 2017; published online before print (10.1148/[id number]).    Diarrhea Diarrhea is frequent loose and watery bowel movements. It can cause you to feel weak and dehydrated. Dehydration can cause you to become tired and thirsty, have a dry mouth, and have decreased urination that often is dark yellow. Diarrhea is a sign of another problem, most often an infection that will not last long. In most cases, diarrhea typically lasts 2-3 days. However, it can last longer if it is a sign of something more serious. It is important to treat your diarrhea as directed by your caregiver to lessen or prevent future episodes of diarrhea. CAUSES  Some common causes include:  Gastrointestinal infections caused by viruses, bacteria, or parasites.  Food poisoning or food allergies.  Certain medicines, such as antibiotics, chemotherapy, and laxatives.  Artificial sweeteners and fructose.  Digestive  disorders. HOME CARE INSTRUCTIONS  Ensure adequate fluid intake (hydration): Have 1 cup (8 oz) of fluid for each diarrhea episode.  Avoid fluids that contain simple sugars or sports drinks, fruit juices, whole milk products, and sodas. Your urine should be clear or pale yellow if you are drinking enough fluids. Hydrate with an oral rehydration solution that you can purchase at pharmacies, retail stores, and online. You can prepare an oral rehydration solution at home by mixing the following ingredients together:   - tsp table salt.   tsp baking soda.   tsp salt substitute containing potassium chloride.  1  tablespoons sugar.  1 L (34 oz) of water.  Certain foods and beverages may increase the speed at which food moves through the gastrointestinal (GI) tract. These foods and beverages should be avoided and include:  Caffeinated and alcoholic beverages.  High-fiber foods, such as raw fruits and vegetables, nuts, seeds, and whole grain breads and cereals.  Foods and beverages sweetened with sugar alcohols, such as xylitol, sorbitol, and mannitol.  Some foods may be well tolerated and may help thicken stool including:  Starchy foods, such as rice, toast, pasta, low-sugar cereal, oatmeal, grits, baked potatoes, crackers, and bagels.  Bananas.  Applesauce.  Add probiotic-rich foods to help increase healthy bacteria in the GI tract, such as yogurt and fermented milk products.  Wash your hands well after each diarrhea episode.  Only take over-the-counter or prescription medicines as directed by your caregiver.  Take a warm bath to relieve any burning or pain from frequent diarrhea episodes. SEEK IMMEDIATE MEDICAL CARE IF:   You are unable to keep fluids down.  You have persistent vomiting.  You have blood in your stool, or your stools are black and tarry.  You do not urinate in 6-8 hours, or there is only a small amount of very dark urine.  You have abdominal pain that  increases or localizes.  You have weakness, dizziness, confusion, or light-headedness.  You have a severe headache.  Your diarrhea gets worse or does not get better.  You have a fever or persistent symptoms for more than 2-3 days.  You have a fever and your symptoms suddenly get worse. MAKE SURE YOU:   Understand these instructions.  Will watch your condition.  Will get help right away if you are not doing well or get worse.   This information is not intended to replace advice given to you by your health care provider. Make sure you discuss any questions you have with your health care provider.   Document Released: 03/24/2002 Document Revised: 04/24/2014 Document Reviewed: 12/10/2011 Elsevier Interactive Patient Education [employer]

## 2015-10-01 NOTE — ED Notes (Signed)
Pt to ED from group home with generalized pain all over and diarrhea that began this am. Pt AAOx4 at this time, vitals stable, NAD noted.

## 2015-10-01 NOTE — ED Provider Notes (Addendum)
[facility] Emergency Department Provider Note  Time seen: 6:10 PM  I have reviewed the triage vital signs and the nursing notes.   HISTORY  Chief Complaint Diarrhea and Generalized Body Aches    HPI [name] is a 50 y.o. female with a past medical history of anxiety, gastric reflux, cognitive impairment, cerebral palsy, borderline personality, schizoaffective disorder, who presents the emergency department with diarrhea. According to the patient since this morning she has had 5 episodes of diarrhea, been nauseated and had several episodes of vomiting. States diffuse abdominal cramping. Denies fever. Denies dysuria. Denies black or bloody stool or vomit. Describes her abdominal pain is 10/10, diffuse and cramping.     Past Medical History  Diagnosis Date  . Asthma   . Anxiety   . GERD (gastroesophageal reflux disease)   . Mild cognitive impairment   . Cerebral palsy (HCC)   . Borderline personality disorder   . Depression   . Wound abscess   . Schizoaffective disorder [facility](HCC)     Patient Active Problem List   Diagnosis Date Noted  . Schizoaffective disorder, bipolar type (HCC)   . Schizoaffective disorder (HCC) 06/17/2015  . 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 GERD (gastroesophageal reflux disease) 09/24/2014  . COPD (chronic obstructive pulmonary disease) (HCC) 09/24/2014  . Borderline personality disorder 09/24/2014  . Cerebral palsy (HCC) 08/31/2014    Past Surgical History  Procedure Laterality Date  . Cholecystectomy    . Tonsillectomy    . Incision and drainage abscess Right 01/02/2015    Procedure: INCISION AND DRAINAGE ABSCESS;  Surgeon: Tiney Rougealph Ely III, MD;  Location: [facility] ORS;  Service: General;  Laterality: Right;    Current Outpatient Rx  Name  Route  Sig  Dispense  Refill  . acetaminophen (TYLENOL) 500 MG tablet   Oral   Take 500 mg by mouth every 8 (eight) hours as needed.         Marland Kitchen. amantadine (SYMMETREL) 100  MG capsule   Oral   Take 1 capsule (100 mg total) by mouth 2 (two) times daily.   60 capsule   0   . aspirin EC 81 MG tablet   Oral   Take 81 mg by mouth daily.         Marland Kitchen. dicyclomine (BENTYL) 20 MG tablet   Oral   Take 20 mg by mouth 3 (three) times daily as needed for spasms.         . diphenoxylate-atropine (LOMOTIL) 2.5-0.025 MG tablet   Oral   Take 1 tablet by mouth 4 (four) times daily as needed for diarrhea or loose stools.         . divalproex (DEPAKOTE ER) 250 MG 24 hr tablet   Oral   Take 500 mg by mouth 2 (two) times daily.         Marland Kitchen. escitalopram (LEXAPRO) 20 MG tablet   Oral   Take 30 mg by mouth daily.         . fenofibrate 54 MG tablet   Oral   Take 54 mg by mouth daily.         . Fluticasone-Salmeterol (ADVAIR) 100-50 MCG/DOSE AEPB   Inhalation   Inhale 1 puff into the lungs 2 (two) times daily.          . hydrOXYzine (ATARAX/VISTARIL) 50 MG tablet   Oral   Take 50 mg by mouth at bedtime.         Marland Kitchen. LORazepam (ATIVAN) 1 MG  tablet   Oral   Take 1 mg by mouth 2 (two) times daily as needed for anxiety.         . meloxicam (MOBIC) 15 MG tablet   Oral   Take 15 mg by mouth daily as needed.          . miconazole (MICOTIN) 2 % powder   Topical   Apply 1 application topically 2 (two) times daily as needed for itching.         . pantoprazole (PROTONIX) 40 MG tablet   Oral   Take 40 mg by mouth 2 (two) times daily.         . QUEtiapine (SEROQUEL) 50 MG tablet   Oral   Take 25 mg by mouth 4 (four) times daily as needed.         . risperiDONE (RISPERDAL) 3 MG tablet   Oral   Take 1 tablet (3 mg total) by mouth at bedtime.   30 tablet   0   . traMADol (ULTRAM) 50 MG tablet   Oral   Take by mouth every 6 (six) hours as needed.           Allergies Penicillins  Family History  Problem Relation Age of Onset  . Diabetes Mother   . Heart attack Father     Social History Social History  Substance Use Topics  .  Smoking status: Never Smoker   . Smokeless tobacco: Never Used  . Alcohol Use: No    Review of Systems Constitutional: Negative for fever. Cardiovascular: Negative for chest pain. Respiratory: Negative for shortness of breath. Gastrointestinal: 10/10 diffuse cramping abdominal pain. Positive for nausea or vomiting and diarrhea. Genitourinary: Negative for dysuria. Musculoskeletal: Negative for back pain Neurological: Negative for headache 10-point ROS otherwise negative.  ____________________________________________   PHYSICAL EXAM:  VITAL SIGNS: ED Triage Vitals  Enc Vitals Group     BP 10/01/15 1746 100/60 mmHg     Pulse Rate 10/01/15 1746 90     Resp 10/01/15 1746 18     Temp 10/01/15 1746 98.4 F (36.9 C)     Temp Source 10/01/15 1746 Oral     SpO2 10/01/15 1746 98 %     Weight 10/01/15 1746 193 lb (87.544 kg)     Height 10/01/15 1746  (1.626 m)     Head Cir --      Peak Flow --      Pain Score 10/01/15 1747 9     Pain Loc --      Pain Edu? --      Excl. in GC? --     Constitutional: Alert and oriented. Well appearing and in no distress. Eyes: Normal exam ENT   Head: Normocephalic and atraumatic.   Mouth/Throat: Mucous membranes are moist. Cardiovascular: Normal rate, regular rhythm. No murmur Respiratory: Normal respiratory effort without tachypnea nor retractions. Breath sounds are clear  Gastrointestinal: Soft, mild diffuse abdominal tenderness palpation without rebound or guarding. No distention. Musculoskeletal: Nontender with normal range of motion in all extremities.  Neurologic:  Normal speech and language. No gross focal neurologic deficits Skin:  Skin is warm, dry and intact.  Psychiatric: Mood and affect are normal.   ____________________________________________   RADIOLOGY  CT shows no acute abnormality, fluid within the large and: Consistent with diarrheal illness.  ____________________________________________    INITIAL  IMPRESSION / ASSESSMENT AND PLAN / ED COURSE  Pertinent labs & imaging results that were available during my care of the  patient were reviewed by me and considered in my medical decision making (see chart for details).  The patient presents the emergency department with diffuse abdominal cramping, nausea, vomiting, diarrhea since this morning. States 5 episodes of diarrhea today. Denies any black or bloody stool. We will check labs, IV hydrate, treat pain and nausea, and closely monitor in the emergency department while awaiting lab results.  CT shows no acute abnormality, fluid within the large intestine consistent with diarrheal illness. Suspect likely gastroenteritis. Labs are largely within normal limits. We'll discharge the patient home with loperamide as needed and supportive care. Patient agreeable to plan.  I have discussed the incidental pulmonary nodules found on CT and I have provided the patient a copy of the CT report on her discharge paperwork to bring to her primary care doctor.  ____________________________________________   FINAL CLINICAL IMPRESSION(S) / ED DIAGNOSES  Nausea vomiting diarrhea   Minna Antis, MD 10/01/15 1610  Minna Antis, MD 10/01/15 2243

## 2015-10-01 NOTE — ED Notes (Signed)
Called pt's group home to give report. Home reported they would call [employer] for pt transport home

## 2015-10-01 NOTE — ED Notes (Signed)
MD Paduchowski at bedside  

## 2015-10-01 NOTE — ED Notes (Signed)
Called pt's group home. Home reported they would send cab to pick up pt. Reviewed d/c instructions, follow-up care and prescriptions. Pt verbalized understanding.

## 2015-10-04 ENCOUNTER — Encounter: Payer: Self-pay | Admitting: Emergency Medicine

## 2015-10-04 ENCOUNTER — Emergency Department
Admission: EM | Admit: 2015-10-04 | Discharge: 2015-10-05 | Disposition: A | Discharge status: Home / Self Care | Payer: Medicare Other | Attending: Emergency Medicine | Admitting: Emergency Medicine

## 2015-10-04 DIAGNOSIS — F603 Borderline personality disorder: Secondary | ICD-10-CM | POA: Diagnosis not present

## 2015-10-04 DIAGNOSIS — G809 Cerebral palsy, unspecified: Secondary | ICD-10-CM | POA: Diagnosis present

## 2015-10-04 DIAGNOSIS — F329 Major depressive disorder, single episode, unspecified: Secondary | ICD-10-CM | POA: Diagnosis not present

## 2015-10-04 DIAGNOSIS — Z046 Encounter for general psychiatric examination, requested by authority: Secondary | ICD-10-CM | POA: Diagnosis present

## 2015-10-04 DIAGNOSIS — Z7982 Long term (current) use of aspirin: Secondary | ICD-10-CM | POA: Insufficient documentation

## 2015-10-04 DIAGNOSIS — F70 Mild intellectual disabilities: Secondary | ICD-10-CM | POA: Diagnosis present

## 2015-10-04 DIAGNOSIS — J45909 Unspecified asthma, uncomplicated: Secondary | ICD-10-CM | POA: Diagnosis not present

## 2015-10-04 DIAGNOSIS — F250 Schizoaffective disorder, bipolar type: Secondary | ICD-10-CM | POA: Insufficient documentation

## 2015-10-04 DIAGNOSIS — J449 Chronic obstructive pulmonary disease, unspecified: Secondary | ICD-10-CM | POA: Diagnosis not present

## 2015-10-04 DIAGNOSIS — R45851 Suicidal ideations: Secondary | ICD-10-CM | POA: Diagnosis not present

## 2015-10-04 DIAGNOSIS — Z79899 Other long term (current) drug therapy: Secondary | ICD-10-CM | POA: Insufficient documentation

## 2015-10-04 LAB — COMPREHENSIVE METABOLIC PANEL
ALBUMIN: 3.4 g/dL — AB (ref 3.5–5.0)
ALK PHOS: 44 U/L (ref 38–126)
ALT: 51 U/L (ref 14–54)
ANION GAP: 9 (ref 5–15)
AST: 55 U/L — AB (ref 15–41)
BILIRUBIN TOTAL: 0.5 mg/dL (ref 0.3–1.2)
BUN: 14 mg/dL (ref 6–20)
CALCIUM: 9 mg/dL (ref 8.9–10.3)
CO2: 27 mmol/L (ref 22–32)
Chloride: 104 mmol/L (ref 101–111)
Creatinine, Ser: 0.74 mg/dL (ref 0.44–1.00)
GFR calc Af Amer: >60 mL/min (ref >60)
GLUCOSE: 92 mg/dL (ref 65–99)
Potassium: 3.6 mmol/L (ref 3.5–5.1)
Sodium: 140 mmol/L (ref 135–145)
TOTAL PROTEIN: 6.7 g/dL (ref 6.5–8.1)

## 2015-10-04 LAB — CBC WITH DIFFERENTIAL/PLATELET
BASOS PCT: 0 %
Basophils Absolute: 0 10*3/uL (ref 0–0.1)
Eosinophils Absolute: 0.1 10*3/uL (ref 0–0.7)
Eosinophils Relative: 2 %
HEMATOCRIT: 37.8 % (ref 35.0–47.0)
HEMOGLOBIN: 12.8 g/dL (ref 12.0–16.0)
LYMPHS PCT: 29 %
Lymphs Abs: 1.8 10*3/uL (ref 1.0–3.6)
MCH: 32 pg (ref 26.0–34.0)
MCHC: 33.9 g/dL (ref 32.0–36.0)
MCV: 94.4 fL (ref 80.0–100.0)
MONO ABS: 1.3 10*3/uL — AB (ref 0.2–0.9)
MONOS PCT: 21 %
NEUTROS ABS: 3 10*3/uL (ref 1.4–6.5)
NEUTROS PCT: 48 %
Platelets: 256 10*3/uL (ref 150–440)
RBC: 4.01 MIL/uL (ref 3.80–5.20)
RDW: 13.2 % (ref 11.5–14.5)
WBC: 6.3 10*3/uL (ref 3.6–11.0)

## 2015-10-04 LAB — ETHANOL: Alcohol, Ethyl (B): <5 mg/dL (ref <5)

## 2015-10-04 MED ORDER — SODIUM CHLORIDE 0.9 % IV BOLUS (SEPSIS)
1000.0000 mL | Freq: Once | INTRAVENOUS | Status: AC
  Administered 2015-10-04: 1000 mL via INTRAVENOUS
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
## 2015-10-04 NOTE — ED Notes (Signed)
MD at bedside. 

## 2015-10-04 NOTE — ED Notes (Addendum)
Ems pt from [facility] [employer] , ambulatory with a  Steady gait, calm and cooperative, thought of self harm today after visiting with her mother. No current thought of self harm, pt states lower abd pain, with dark tary stools, pt hypotensive on vitals

## 2015-10-04 NOTE — ED Notes (Signed)
Lab tubes RED and BLUE top only sent to lab, unable to obtain light green and purple

## 2015-10-04 NOTE — ED Provider Notes (Signed)
[facility] Emergency Department Provider Note ____________________________________________  Time seen: Approximately 6:36 PM  I have reviewed the triage vital signs and the nursing notes.   HISTORY  Chief Complaint Psychiatric Evaluation    HPI [name] is a 50 y.o. female with a past medical history of cognitive impairment, cerebral palsy, borderline personality, schizoaffective disorder, who presents the emergency department via EMS withsuicidal ideation with a plan of running out into traffic and getting hit by car. She states she's been upset all day crying after interacting with her mother.  She also reports having black tarry stool since this morning. She states she takes ibuprofen daily, approximately 2 tablets. She's never had any bleeding before. She complains of lower abdominal pain. She does not get periods anymore as she has been implanted in her arm.  She denies any nausea or vomiting or chest pain. She endorses cold sweats.  Past Medical History  Diagnosis Date  . Asthma   . Anxiety   . GERD (gastroesophageal reflux disease)   . Mild cognitive impairment   . Cerebral palsy (HCC)   . Borderline personality disorder   . Depression   . Wound abscess   . Schizoaffective disorder [facility])     Patient Active Problem List   Diagnosis Date Noted  . Schizoaffective disorder, bipolar type (HCC)   . Schizoaffective disorder (HCC) 06/17/2015  . 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 GERD (gastroesophageal reflux disease) 09/24/2014  . COPD (chronic obstructive pulmonary disease) (HCC) 09/24/2014  . Borderline personality disorder 09/24/2014  . Cerebral palsy (HCC) 08/31/2014    Past Surgical History  Procedure Laterality Date  . Cholecystectomy    . Tonsillectomy    . Incision and drainage abscess Right 01/02/2015    Procedure: INCISION AND DRAINAGE ABSCESS;  Surgeon: Tiney Rouge III, MD;  Location: [facility] ORS;  Service: General;   Laterality: Right;    Current Outpatient Rx  Name  Route  Sig  Dispense  Refill  . acetaminophen (TYLENOL) 500 MG tablet   Oral   Take 500 mg by mouth every 8 (eight) hours as needed.         Marland Kitchen EXPIRED: amantadine (SYMMETREL) 100 MG capsule   Oral   Take 1 capsule (100 mg total) by mouth 2 (two) times daily.   60 capsule   0   . aspirin EC 81 MG tablet   Oral   Take 81 mg by mouth daily.         Marland Kitchen dicyclomine (BENTYL) 20 MG tablet   Oral   Take 20 mg by mouth 3 (three) times daily as needed for spasms.         . diphenoxylate-atropine (LOMOTIL) 2.5-0.025 MG tablet   Oral   Take 1 tablet by mouth 4 (four) times daily as needed for diarrhea or loose stools.         . divalproex (DEPAKOTE ER) 250 MG 24 hr tablet   Oral   Take 500 mg by mouth 2 (two) times daily.         Marland Kitchen escitalopram (LEXAPRO) 20 MG tablet   Oral   Take 30 mg by mouth daily.         . fenofibrate 54 MG tablet   Oral   Take 54 mg by mouth daily.         . Fluticasone-Salmeterol (ADVAIR) 100-50 MCG/DOSE AEPB   Inhalation   Inhale 1 puff into the lungs 2 (two) times daily.          Marland Kitchen  HYDROcodone-acetaminophen (NORCO/VICODIN) 5-325 MG tablet   Oral   Take 1 tablet by mouth every 4 (four) hours as needed.   10 tablet   0   . hydrOXYzine (ATARAX/VISTARIL) 50 MG tablet   Oral   Take 50 mg by mouth at bedtime.         Marland Kitchen. loperamide (IMODIUM A-D) 2 MG tablet   Oral   Take 1 tablet (2 mg total) by mouth 4 (four) times daily as needed for diarrhea or loose stools.   20 tablet   0   . LORazepam (ATIVAN) 1 MG tablet   Oral   Take 1 mg by mouth 2 (two) times daily as needed for anxiety.         . meloxicam (MOBIC) 15 MG tablet   Oral   Take 15 mg by mouth daily as needed.          . miconazole (MICOTIN) 2 % powder   Topical   Apply 1 application topically 2 (two) times daily as needed for itching.         . pantoprazole (PROTONIX) 40 MG tablet   Oral   Take 40 mg by  mouth 2 (two) times daily.         . QUEtiapine (SEROQUEL) 50 MG tablet   Oral   Take 25 mg by mouth 4 (four) times daily as needed.         . risperiDONE (RISPERDAL) 3 MG tablet   Oral   Take 1 tablet (3 mg total) by mouth at bedtime.   30 tablet   0   . traMADol (ULTRAM) 50 MG tablet   Oral   Take by mouth every 6 (six) hours as needed.           Allergies Penicillins  Family History  Problem Relation Age of Onset  . Diabetes Mother   . Heart attack Father     Social History Social History  Substance Use Topics  . Smoking status: Never Smoker   . Smokeless tobacco: Never Used  . Alcohol Use: No    Review of Systems Constitutional: No fever/chills Eyes: No visual changes. ENT: No sore throat. Cardiovascular: Denies chest pain. Respiratory: Denies shortness of breath. Gastrointestinal: see hpi Genitourinary: Negative for dysuria. Musculoskeletal: Negative for back pain. Skin: Negative for rash. Neurological: Negative for headache  10-point ROS otherwise negative.  ____________________________________________   PHYSICAL EXAM:  VITAL SIGNS: Filed Vitals:   10/04/15 1840 10/04/15 2305  BP: 99/62 105/69  Pulse: 72 70  Temp: 99.4 F (37.4 C)   Resp: 18 16     Constitutional: Alert and oriented. Well appearing and in no acute distress. Eyes: Conjunctivae are normal. PERRL. EOMI. Head: Atraumatic. Nose: No congestion/rhinnorhea. Mouth/Throat: Mucous membranes are moist.  Oropharynx non-erythematous. Neck: No stridor.   Cardiovascular: Normal rate, regular rhythm. Grossly normal heart sounds.  Good peripheral circulation. Respiratory: Normal respiratory effort.  No retractions. Lungs CTAB. Gastrointestinal: Soft and nontender. No distention. No abdominal bruits. No CVA tenderness. Rectal:  Musculoskeletal: No lower extremity tenderness nor edema.   Neurologic:  Normal speech and language. No gross focal neurologic deficits are appreciated. No  gait instability. Skin:  Skin is warm, dry and intact. No rash noted. Psychiatric: Pt seems sad when giving Hx. Speech and behavior are otherwise normal.  ____________________________________________   LABS (all labs ordered are listed, but only abnormal results are displayed)  Labs Reviewed  CBC WITH DIFFERENTIAL/PLATELET - Abnormal; Notable for the following:  Monocytes Absolute 1.3 (*)    All other components within normal limits  COMPREHENSIVE METABOLIC PANEL - Abnormal; Notable for the following:    Albumin 3.4 (*)    AST 55 (*)    All other components within normal limits  ETHANOL  CBC WITH DIFFERENTIAL/PLATELET  URINALYSIS COMPLETEWITH MICROSCOPIC ([facility] ONLY)  URINE DRUG SCREEN, QUALITATIVE ([facility] ONLY)   ___ ____________________________________________   INITIAL IMPRESSION / ASSESSMENT AND PLAN / ED COURSE  Pertinent labs & imaging results that were available during my care of the patient were reviewed by me and considered in my medical decision making (see chart for details).  ----------------------------------------- 11:21 PM on 10/04/2015 -----------------------------------------  Rectal exam with light brown stool, heme negative. Patient is medically cleared for psychiatric referral. She has early been seen by TTS and awaits consult by psychiatry. ____________________________________________   FINAL CLINICAL IMPRESSION(S) / ED DIAGNOSES Suicidal ideation  New prescriptions started this visit New Prescriptions   No medications on file     Maurilio Lovely, MD 10/04/15 2323

## 2015-10-05 DIAGNOSIS — R45851 Suicidal ideations: Secondary | ICD-10-CM | POA: Diagnosis not present

## 2015-10-05 DIAGNOSIS — F603 Borderline personality disorder: Secondary | ICD-10-CM

## 2015-10-05 LAB — URINALYSIS COMPLETE WITH MICROSCOPIC (ARMC ONLY)
Bilirubin Urine: NEGATIVE
GLUCOSE, UA: NEGATIVE mg/dL
HGB URINE DIPSTICK: NEGATIVE
LEUKOCYTES UA: NEGATIVE
NITRITE: NEGATIVE
PROTEIN: 30 mg/dL — AB
SPECIFIC GRAVITY, URINE: 1.02 (ref 1.005–1.030)
pH: 5 (ref 5.0–8.0)

## 2015-10-05 LAB — URINE DRUG SCREEN, QUALITATIVE (ARMC ONLY)
Amphetamines, Ur Screen: NOT DETECTED
BENZODIAZEPINE, UR SCRN: POSITIVE — AB
Barbiturates, Ur Screen: NOT DETECTED
CANNABINOID 50 NG, UR ~~LOC~~: NOT DETECTED
COCAINE METABOLITE, UR ~~LOC~~: NOT DETECTED
MDMA (Ecstasy)Ur Screen: NOT DETECTED
Methadone Scn, Ur: NOT DETECTED
Opiate, Ur Screen: NOT DETECTED
PHENCYCLIDINE (PCP) UR S: NOT DETECTED
TRICYCLIC, UR SCREEN: NOT DETECTED

## 2015-10-05 NOTE — ED Notes (Signed)
Pt sitting on hallway bed, pt alert and oriented. Pt has not had any stools since 0700. Pt awaiting psych consult.

## 2015-10-05 NOTE — ED Notes (Signed)
Introduced self to patient. Pt reports that she is "suicidal" States that she plans to get hit by a car.  No sitter was present with patient when assuming care of this patient.

## 2015-10-05 NOTE — ED Provider Notes (Signed)
Progress note:  8:40 AM Patient upon evaluation is still extremely suicidal. A sitter was placed in her room while awaiting psych assessment.  Leona CarryLinda M Ariyon Gerstenberger, MD 10/05/15 [phone]

## 2015-10-05 NOTE — Discharge Instructions (Signed)
Return to the emergency department for thoughts of hurting yourself, or others, hallucinations, or any other symptoms concerning to you.   Suicidal Feelings: How to Help Yourself Suicide is the taking of one's own life. If you feel as though life is getting too tough to handle and are thinking about suicide, get help right away. To get help:  Call your local emergency services (911 in the [place]  Call a suicide hotline to speak with a trained counselor who understands how you are feeling. The following is a list of suicide hotlines in the [place]. For a list of hotlines in [place], visit InkDistributor.it.  1-800-273-TALK 337-666-2087).  1-800-SUICIDE 207-524-8838).  515-786-6156. This is a hotline for Spanish speakers.  2-725-366-4QIH (878)011-9267). This is a hotline for TTY users.  1-866-4-U-TREVOR 709-675-9233). This is a hotline for lesbian, gay, bisexual, transgender, or questioning youth.  Contact a crisis center or a local suicide prevention center. To find a crisis center or suicide prevention center:  Call your local hospital, clinic, community service organization, mental health center, social service provider, or health department. Ask for assistance in connecting to a crisis center.  Visit https://www.patel-king.com/ for a list of crisis centers in the [place], or visit www.suicideprevention.ca/thinking-about-suicide/find-a-crisis-centre for a list of centers in [place].  Visit the following websites:  National Suicide Prevention Lifeline: www.suicidepreventionlifeline.org  Hopeline: www.hopeline.com  [employer] for Suicide Prevention: https://www.ayers.com/  [employer] (for lesbian, gay, bisexual, transgender, or questioning youth): www.thetrevorproject.org HOW CAN I HELP MYSELF FEEL BETTER?  Promise yourself that you will not do anything drastic when  you have suicidal feelings. Remember, there is hope. Many people have gotten through suicidal thoughts and feelings, and you will, too. You may have gotten through them before, and this proves that you can get through them again.  Let family, friends, teachers, or counselors know how you are feeling. Try not to isolate yourself from those who care about you. Remember, they will want to help you. Talk with someone every day, even if you do not feel sociable. Face-to-face conversation is best.  Call a mental health professional and see one regularly.  Visit your primary health care provider every year.  Eat a well-balanced diet, and space your meals so you eat regularly.  Get plenty of rest.  Avoid alcohol and drugs, and remove them from your home. They will only make you feel worse.  If you are thinking of taking a lot of medicine, give your medicine to someone who can give it to you one day at a time. If you are on antidepressants and are concerned you will overdose, let your health care provider know so he or she can give you safer medicines. Ask your mental health professional about the possible side effects of any medicines you are taking.  Remove weapons, poisons, knives, and anything else that could harm you from your home.  Try to stick to routines. Follow a schedule every day. Put self-care on your schedule.  Make a list of realistic goals, and cross them off when you achieve them. Accomplishments give a sense of worth.  Wait until you are feeling better before doing the things you find difficult or unpleasant.  Exercise if you are able. You will feel better if you exercise for even a half hour each day.  Go out in the sun or into nature. This will help you recover from depression faster. If you have a favorite place to walk, go there.  Do the things that have  always given you pleasure. Play your favorite music, read a good book, paint a picture, play your favorite instrument, or  do anything else that takes your mind off your depression if it is safe to do.  Keep your living space well lit.  When you are feeling well, write yourself a letter about tips and support that you can read when you are not feeling well.  Remember that life's difficulties can be sorted out with help. Conditions can be treated. You can work on thoughts and strategies that serve you well.   This information is not intended to replace advice given to you by your health care provider. Make sure you discuss any questions you have with your health care provider.   Document Released: 10/08/2002 Document Revised: 04/24/2014 Document Reviewed: 07/29/2013 Elsevier Interactive Patient Education [employer] Inc.

## 2015-10-05 NOTE — ED Notes (Signed)
Pt given breakfast tray

## 2015-10-05 NOTE — ED Provider Notes (Signed)
-----------------------------------------   5:50 AM on 10/05/2015 -----------------------------------------   Blood pressure 105/69, pulse 70, temperature 99.4 F (37.4 C), temperature source Oral, resp. rate 16, height 5' (1.524 m), weight 194 lb (87.998 kg), SpO2 96 %.  The patient had no acute events since last update.  Calm and cooperative at this time.  Disposition is pending per Psychiatry/Behavioral Medicine team recommendations.   Patient remained stable overnight though had complaints of some loose watery stool over the last 2 days. Stool culture and C. difficile etc. has been ordered.  Jennye MoccasinBrian S Montez Cuda, MD 10/05/15 [phone]

## 2015-10-05 NOTE — Consult Note (Signed)
[facility] Consult   Reason for Consult:  Consult for 50 year old woman who presents to the emergency room once again stating that she is having suicidal thoughts. Referring Physician:  Lovena Le [name] MRN:  [id number] Principal Diagnosis: Borderline personality disorder Diagnosis:   Patient Active Problem List   Diagnosis Date Noted  . Schizoaffective disorder, bipolar type ([place]) [F25.0]   . Schizoaffective disorder ([place]) [F25.9] 06/17/2015  . 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 GERD (gastroesophageal reflux disease) [K21.9] 09/24/2014  . COPD (chronic obstructive pulmonary disease) ([place]) [J44.9] 09/24/2014  . Borderline personality disorder [F60.3] 09/24/2014  . Cerebral palsy ([place]) [G80.9] 08/31/2014    Total Time spent with patient: 45 minutes  Subjective:   [name] is a 50 y.o. female patient admitted with "if you send me back to that group home I am going to run out into traffic".  HPI:  Patient interviewed. Chart reviewed. Case discussed with emergency room physician and TTS. Patient is very well known to psychiatric services from multiple hospital visits in the past. She had her self brought to the emergency room this time stating as usual that she is going to run out into traffic to get hit by a car if she has to go back to her group home. Apparently she spent the weekend at her mother's house and when she had to go back to the group home she got very upset. This is her usual pattern. She is always complaining of being dissatisfied with the group home. Says that she doesn't like the way that they treat her there and that they talk to her in a mean fashion. Patient is not reporting any suicidal behavior yet. She is denying any hallucinations. Mood is chronically irritable and anxious and dysphoric but specifically around the issue of going back to the group home. It's important to recognize that her suicidal  ideation is entirely used as a threat to try and manipulate into having her group home changed. She does not actually want to die or have any thought about killing herself in other circumstances. No change to medicine substance abuse.  Medical history: Patient has mild cerebral palsy with only minimal chronic physical deficits. No other medical problems other than her COPD and gastric reflux.  Substance abuse history: No history of alcohol or drug abuse  Social history: Patient lives at a group home. Her closest relatives are her mother and brother. Chronic complaints about her group home which has been the case at other facilities as well.  Past Psychiatric History: Carries a diagnosis of schizoaffective disorder although she does not appear to be actively psychotic. She is not responding to internal stimuli does not appear to be hallucinating and is not paranoid. Borderline intellectual impairment related to cerebral palsy. Currently on medication. Does have a history of running out into the street.  Risk to Self: Suicidal Ideation: Yes-Currently Present Suicidal Intent: Yes-Currently Present Is patient at risk for suicide?: Yes Suicidal Plan?: Yes-Currently Present Specify Current Suicidal Plan: Walk into traffic Access to Means: No Specify Access to Suicidal Means:  (Currently in the hospital) What has been your use of drugs/alcohol within the last 12 months?: denied use How many times?: 3 Other Self Harm Risks: denied Triggers for Past Attempts: None known Intentional Self Injurious Behavior: None Risk to Others: Homicidal Ideation: No Thoughts of Harm to Others: No Current Homicidal Intent: No Current Homicidal Plan: No Access to Homicidal Means: No Identified Victim: None identified History of harm  to others?: No Assessment of Violence: None Noted Violent Behavior Description: denied Does patient have access to weapons?: No Criminal Charges Pending?: No Does patient have a  court date: No Prior Inpatient Therapy: Prior Inpatient Therapy: Yes Prior Therapy Dates: 2017 and prior Prior Therapy Facilty/Provider(s): [facility], [facility] Reason for Treatment: Depression, SI Prior Outpatient Therapy: Prior Outpatient Therapy: Yes Prior Therapy Dates: Current Prior Therapy Facilty/Provider(s): [facility] Reason for Treatment: Depression Does patient have an ACCT team?: No Does patient have Intensive In-House Services?  : No Does patient have [facility] services? : No Does patient have P4CC services?: No  Past Medical History:  Past Medical History  Diagnosis Date  . Asthma   . Anxiety   . GERD (gastroesophageal reflux disease)   . Mild cognitive impairment   . Cerebral palsy ([place])   . Borderline personality disorder   . Depression   . Wound abscess   . Schizoaffective disorder [facility])     Past Surgical History  Procedure Laterality Date  . Cholecystectomy    . Tonsillectomy    . Incision and drainage abscess Right 01/02/2015    Procedure: INCISION AND DRAINAGE ABSCESS;  Surgeon: Dia Crawford III, MD;  Location: [facility] ORS;  Service: General;  Laterality: Right;   Family History:  Family History  Problem Relation Age of Onset  . Diabetes Mother   . Heart attack Father    Family Psychiatric  History: Nonidentified Social History:  History  Alcohol Use No     History  Drug Use No    Social History   Social History  . Marital Status: Single    Spouse Name: N/A  . Number of Children: N/A  . Years of Education: N/A   Social History Main Topics  . Smoking status: Never Smoker   . Smokeless tobacco: Never Used  . Alcohol Use: No  . Drug Use: No  . Sexual Activity: Yes    Birth Control/ Protection: Implant   Other Topics Concern  . None   Social History Narrative   Additional Social History:    Allergies:   Allergies  Allergen Reactions  . Penicillins Other (See Comments)    Reaction:  Unknown     Labs:  Results for orders placed or  performed during the hospital encounter of 10/04/15 (from the past 48 hour(s))  Ethanol     Status: None   Collection Time: 10/04/15  7:00 PM  Result Value Ref Range   Alcohol, Ethyl (B) <5 <5 mg/dL    Comment:        LOWEST DETECTABLE LIMIT FOR SERUM ALCOHOL IS 5 mg/dL FOR MEDICAL PURPOSES ONLY   CBC with Differential/Platelet     Status: Abnormal   Collection Time: 10/04/15  7:13 PM  Result Value Ref Range   WBC 6.3 3.6 - 11.0 K/uL   RBC 4.01 3.80 - 5.20 MIL/uL   Hemoglobin 12.8 12.0 - 16.0 g/dL   HCT 37.8 35.0 - 47.0 %   MCV 94.4 80.0 - 100.0 fL   MCH 32.0 26.0 - 34.0 pg   MCHC 33.9 32.0 - 36.0 g/dL   RDW 13.2 11.5 - 14.5 %   Platelets 256 150 - 440 K/uL   Neutrophils Relative % 48 %   Neutro Abs 3.0 1.4 - 6.5 K/uL   Lymphocytes Relative 29 %   Lymphs Abs 1.8 1.0 - 3.6 K/uL   Monocytes Relative 21 %   Monocytes Absolute 1.3 (H) 0.2 - 0.9 K/uL   Eosinophils Relative  2 %   Eosinophils Absolute 0.1 0 - 0.7 K/uL   Basophils Relative 0 %   Basophils Absolute 0.0 0 - 0.1 K/uL  Comprehensive metabolic panel     Status: Abnormal   Collection Time: 10/04/15  7:13 PM  Result Value Ref Range   Sodium 140 135 - 145 mmol/L   Potassium 3.6 3.5 - 5.1 mmol/L   Chloride 104 101 - 111 mmol/L   CO2 27 22 - 32 mmol/L   Glucose, Bld 92 65 - 99 mg/dL   BUN 14 6 - 20 mg/dL   Creatinine, Ser 0.74 0.44 - 1.00 mg/dL   Calcium 9.0 8.9 - 10.3 mg/dL   Total Protein 6.7 6.5 - 8.1 g/dL   Albumin 3.4 (L) 3.5 - 5.0 g/dL   AST 55 (H) 15 - 41 U/L   ALT 51 14 - 54 U/L   Alkaline Phosphatase 44 38 - 126 U/L   Total Bilirubin 0.5 0.3 - 1.2 mg/dL   GFR calc non Af Amer >60 >60 mL/min   GFR calc Af Amer >60 >60 mL/min    Comment: (NOTE) The eGFR has been calculated using the CKD EPI equation. This calculation has not been validated in all clinical situations. eGFR's persistently <60 mL/min signify possible Chronic Kidney Disease.    Anion gap 9 5 - 15  Urinalysis complete, with microscopic      Status: Abnormal   Collection Time: 10/05/15  1:05 AM  Result Value Ref Range   Color, Urine YELLOW (A) YELLOW   APPearance HAZY (A) CLEAR   Glucose, UA NEGATIVE NEGATIVE mg/dL   Bilirubin Urine NEGATIVE NEGATIVE   Ketones, ur TRACE (A) NEGATIVE mg/dL   Specific Gravity, Urine 1.020 1.005 - 1.030   Hgb urine dipstick NEGATIVE NEGATIVE   pH 5.0 5.0 - 8.0   Protein, ur 30 (A) NEGATIVE mg/dL   Nitrite NEGATIVE NEGATIVE   Leukocytes, UA NEGATIVE NEGATIVE   RBC / HPF 0-5 0 - 5 RBC/hpf   WBC, UA 6-30 0 - 5 WBC/hpf   Bacteria, UA RARE (A) NONE SEEN   Squamous Epithelial / LPF 6-30 (A) NONE SEEN   Mucous PRESENT   Urine Drug Screen, Qualitative     Status: Abnormal   Collection Time: 10/05/15  1:05 AM  Result Value Ref Range   Tricyclic, Ur Screen NONE DETECTED NONE DETECTED   Amphetamines, Ur Screen NONE DETECTED NONE DETECTED   MDMA (Ecstasy)Ur Screen NONE DETECTED NONE DETECTED   Cocaine Metabolite,Ur [place] NONE DETECTED NONE DETECTED   Opiate, Ur Screen NONE DETECTED NONE DETECTED   Phencyclidine (PCP) Ur S NONE DETECTED NONE DETECTED   Cannabinoid 50 Ng, Ur  NONE DETECTED NONE DETECTED   Barbiturates, Ur Screen NONE DETECTED NONE DETECTED   Benzodiazepine, Ur Scrn POSITIVE (A) NONE DETECTED   Methadone Scn, Ur NONE DETECTED NONE DETECTED    Comment: (NOTE) 707  Tricyclics, urine               Cutoff 1000 ng/mL 200  Amphetamines, urine             Cutoff 1000 ng/mL 300  MDMA (Ecstasy), urine           Cutoff 500 ng/mL 400  Cocaine Metabolite, urine       Cutoff 300 ng/mL 500  Opiate, urine                   Cutoff 300 ng/mL 600  Phencyclidine (PCP), urine  Cutoff 25 ng/mL 700  Cannabinoid, urine              Cutoff 50 ng/mL 800  Barbiturates, urine             Cutoff 200 ng/mL 900  Benzodiazepine, urine           Cutoff 200 ng/mL 1000 Methadone, urine                Cutoff 300 ng/mL 1100 1200 The urine drug screen provides only a preliminary, unconfirmed 1300 analytical  test result and should not be used for non-medical 1400 purposes. Clinical consideration and professional judgment should 1500 be applied to any positive drug screen result due to possible 1600 interfering substances. A more specific alternate chemical method 1700 must be used in order to obtain a confirmed analytical result.  1800 Gas chromato graphy / mass spectrometry (GC/MS) is the preferred 1900 confirmatory method.     No current facility-administered medications for this encounter.   Current Outpatient Prescriptions  Medication Sig Dispense Refill  . acetaminophen (TYLENOL) 500 MG tablet Take 500 mg by mouth every 8 (eight) hours as needed.    Marland Kitchen amantadine (SYMMETREL) 100 MG capsule Take 1 capsule (100 mg total) by mouth 2 (two) times daily. 60 capsule 0  . aspirin EC 81 MG tablet Take 81 mg by mouth daily.    Marland Kitchen dicyclomine (BENTYL) 20 MG tablet Take 20 mg by mouth 3 (three) times daily as needed for spasms.    . diphenoxylate-atropine (LOMOTIL) 2.5-0.025 MG tablet Take 1 tablet by mouth 4 (four) times daily as needed for diarrhea or loose stools.    . divalproex (DEPAKOTE ER) 250 MG 24 hr tablet Take 500 mg by mouth 2 (two) times daily.    Marland Kitchen escitalopram (LEXAPRO) 20 MG tablet Take 30 mg by mouth daily.    . fenofibrate 54 MG tablet Take 54 mg by mouth daily.    . Fluticasone-Salmeterol (ADVAIR) 100-50 MCG/DOSE AEPB Inhale 1 puff into the lungs 2 (two) times daily.     Marland Kitchen HYDROcodone-acetaminophen (NORCO/VICODIN) 5-325 MG tablet Take 1 tablet by mouth every 4 (four) hours as needed. 10 tablet 0  . hydrOXYzine (ATARAX/VISTARIL) 50 MG tablet Take 50 mg by mouth at bedtime.    Marland Kitchen loperamide (IMODIUM A-D) 2 MG tablet Take 1 tablet (2 mg total) by mouth 4 (four) times daily as needed for diarrhea or loose stools. 20 tablet 0  . LORazepam (ATIVAN) 1 MG tablet Take 1 mg by mouth 2 (two) times daily as needed for anxiety.    . meloxicam (MOBIC) 15 MG tablet Take 15 mg by mouth daily as  needed.     . miconazole (MICOTIN) 2 % powder Apply 1 application topically 2 (two) times daily as needed for itching.    . pantoprazole (PROTONIX) 40 MG tablet Take 40 mg by mouth 2 (two) times daily.    . QUEtiapine (SEROQUEL) 50 MG tablet Take 25 mg by mouth 4 (four) times daily as needed.    . risperiDONE (RISPERDAL) 3 MG tablet Take 1 tablet (3 mg total) by mouth at bedtime. 30 tablet 0  . traMADol (ULTRAM) 50 MG tablet Take by mouth every 6 (six) hours as needed.      Musculoskeletal: Strength & Muscle Tone: within normal limits Gait & Station: normal Patient leans: N/A  Psychiatric Specialty Exam: Physical Exam  Nursing note and vitals reviewed. Constitutional: She appears well-developed and well-nourished.  HENT:  Head: Normocephalic  and atraumatic.  Eyes: Conjunctivae are normal. Pupils are equal, round, and reactive to light.  Neck: Normal range of motion.  Cardiovascular: Regular rhythm and normal heart sounds.   Respiratory: Effort normal. No respiratory distress.  GI: Soft.  Musculoskeletal: Normal range of motion.  Neurological: She is alert.  Skin: Skin is warm and dry.  Psychiatric: Her affect is blunt. Her speech is delayed. She is slowed. Cognition and memory are impaired. She expresses impulsivity. She expresses suicidal ideation.    Review of Systems  Constitutional: Negative.   HENT: Negative.   Eyes: Negative.   Respiratory: Negative.   Cardiovascular: Negative.   Gastrointestinal: Negative.   Musculoskeletal: Negative.   Skin: Negative.   Neurological: Negative.   Psychiatric/Behavioral: Positive for depression and suicidal ideas. Negative for hallucinations, memory loss and substance abuse. The patient is nervous/anxious and has insomnia.     Blood pressure 108/77, pulse 72, temperature 98.4 F (36.9 C), temperature source Oral, resp. rate 16, height 5' (1.524 m), weight 87.998 kg (194 lb), SpO2 96 %.Body mass index is 37.89 kg/(m^2).  General  Appearance: Fairly Groomed  Eye Contact:  Fair  Speech:  Clear and Coherent  Volume:  Normal  Mood:  Dysphoric  Affect:  Depressed and Tearful  Thought Process:  Goal Directed  Orientation:  Full (Time, Place, and Person)  Thought Content:  Rumination  Suicidal Thoughts:  Yes.  without intent/plan  Homicidal Thoughts:  No  Memory:  Immediate;   Good Recent;   Fair Remote;   Fair  Judgement:  Impaired  Insight:  Lacking  Psychomotor Activity:  Decreased  Concentration:  Concentration: Fair  Recall:  [employer] of Knowledge:  Fair  Language:  Fair  Akathisia:  No  Handed:  Right  AIMS (if indicated):     Assets:  Housing Physical Health Resilience  ADL's:  Intact  Cognition:  Impaired,  Mild  Sleep:        Treatment Plan Summary: Plan 50 year old woman presents in exactly the same condition as her other emergency room visits. She is well known to use threats of suicide to try manipulate her group home. She does not have any symptoms or illness that are likely to be treated or improve in an inpatient psychiatric unit. Patient has been through this many times before. One she goes back to her group home the likelihood of her actually harming herself is relatively low and is not likely to be improved by anything we will do here in the emergency room. Offered support and empathy to the patient and as usual encouraged her to try to work on getting along better at the group home or considering ways to find a new place to live in a more appropriate fashion. Case reviewed with the ER physician. IVC discontinued. Patient can be discharged and has outpatient mental health treatment already in place.  Disposition: Patient does not meet criteria for psychiatric inpatient admission. Supportive therapy provided about ongoing stressors.  Alethia Berthold, MD 10/05/2015 4:33 PM

## 2015-10-05 NOTE — ED Notes (Signed)
Pt transferred to behavioral quad.  Report given to [name]

## 2015-10-05 NOTE — ED Notes (Signed)
Pt resting in bed.

## 2015-10-05 NOTE — ED Notes (Signed)
Pt ambulated to bathroom 

## 2015-10-26 ENCOUNTER — Ambulatory Visit
Admission: RE | Admit: 2015-10-26 | Discharge: 2015-10-26 | Disposition: A | Discharge status: Home / Self Care | Payer: Medicare Other | Source: Ambulatory Visit | Attending: Internal Medicine | Admitting: Internal Medicine

## 2015-10-26 DIAGNOSIS — Z1231 Encounter for screening mammogram for malignant neoplasm of breast: Secondary | ICD-10-CM | POA: Insufficient documentation

## 2015-10-27 ENCOUNTER — Emergency Department: Payer: Medicare Other

## 2015-10-27 ENCOUNTER — Encounter: Payer: Self-pay | Admitting: Emergency Medicine

## 2015-10-27 DIAGNOSIS — F250 Schizoaffective disorder, bipolar type: Secondary | ICD-10-CM | POA: Diagnosis not present

## 2015-10-27 DIAGNOSIS — Z046 Encounter for general psychiatric examination, requested by authority: Secondary | ICD-10-CM | POA: Diagnosis present

## 2015-10-27 DIAGNOSIS — R079 Chest pain, unspecified: Secondary | ICD-10-CM | POA: Insufficient documentation

## 2015-10-27 DIAGNOSIS — J449 Chronic obstructive pulmonary disease, unspecified: Secondary | ICD-10-CM | POA: Insufficient documentation

## 2015-10-27 DIAGNOSIS — J45909 Unspecified asthma, uncomplicated: Secondary | ICD-10-CM | POA: Diagnosis not present

## 2015-10-27 DIAGNOSIS — Z79899 Other long term (current) drug therapy: Secondary | ICD-10-CM | POA: Insufficient documentation

## 2015-10-27 DIAGNOSIS — F329 Major depressive disorder, single episode, unspecified: Secondary | ICD-10-CM | POA: Diagnosis not present

## 2015-10-27 DIAGNOSIS — Z7982 Long term (current) use of aspirin: Secondary | ICD-10-CM | POA: Insufficient documentation

## 2015-10-27 LAB — BASIC METABOLIC PANEL
ANION GAP: 7 (ref 5–15)
BUN: 27 mg/dL — ABNORMAL HIGH (ref 6–20)
CHLORIDE: 105 mmol/L (ref 101–111)
CO2: 28 mmol/L (ref 22–32)
CREATININE: 0.91 mg/dL (ref 0.44–1.00)
Calcium: 9.1 mg/dL (ref 8.9–10.3)
GFR calc non Af Amer: >60 mL/min (ref >60)
Glucose, Bld: 99 mg/dL (ref 65–99)
POTASSIUM: 3.3 mmol/L — AB (ref 3.5–5.1)
SODIUM: 140 mmol/L (ref 135–145)

## 2015-10-27 LAB — ACETAMINOPHEN LEVEL

## 2015-10-27 LAB — CBC
HEMATOCRIT: 38.3 % (ref 35.0–47.0)
HEMOGLOBIN: 13.2 g/dL (ref 12.0–16.0)
MCH: 32.7 pg (ref 26.0–34.0)
MCHC: 34.5 g/dL (ref 32.0–36.0)
MCV: 94.8 fL (ref 80.0–100.0)
PLATELETS: 238 10*3/uL (ref 150–440)
RBC: 4.04 MIL/uL (ref 3.80–5.20)
RDW: 14.1 % (ref 11.5–14.5)
WBC: 6.1 10*3/uL (ref 3.6–11.0)

## 2015-10-27 LAB — SALICYLATE LEVEL

## 2015-10-27 LAB — TROPONIN I: Troponin I: <0.03 ng/mL (ref <0.03)

## 2015-10-27 LAB — ETHANOL: Alcohol, Ethyl (B): <5 mg/dL (ref <5)

## 2015-10-27 NOTE — ED Notes (Signed)
Unable to urinate at this time.  

## 2015-10-27 NOTE — ED Notes (Signed)
Patient presents to ED with c/o mid chest pain since today, pt also states "I am feeling a little suicidal." Per EMS pt has been having intermittent chest pain for 6 weeks. Pt alert and oriented x 4, no increased work in breathing noted, skin warm and dry.

## 2015-10-28 ENCOUNTER — Emergency Department
Admission: EM | Admit: 2015-10-28 | Discharge: 2015-10-28 | Disposition: A | Discharge status: Home / Self Care | Payer: Medicare Other | Attending: Emergency Medicine | Admitting: Emergency Medicine

## 2015-10-28 DIAGNOSIS — R079 Chest pain, unspecified: Secondary | ICD-10-CM

## 2015-10-28 NOTE — ED Provider Notes (Signed)
[facility] Emergency Department Provider Note    ____________________________________________  Time seen: ~0100  I have reviewed the triage vital signs and the nursing notes.   HISTORY  Chief Complaint Psychiatric Evaluation and Chest Pain   History limited by: Not Limited   HPI [name] is a 50 y.o. female who presents to the emergency department today with chest pain. It started today. It was located in the mid chest. She has been having intermittent chest pain for a number of weeks. Denies any recent fevers or shortness of breath. In addition patient states that she has been having some depression suicidal ideation. I personally evaluated this patient multiple multiple times and she has had similar psychiatric complaints. She has been evaluated by psychiatry multiple times here in the emergency department. She does have issues with chronic SI complaints and problems with her group home.    Past Medical History  Diagnosis Date  . Asthma   . Anxiety   . GERD (gastroesophageal reflux disease)   . Mild cognitive impairment   . Cerebral palsy (HCC)   . Borderline personality disorder   . Depression   . Wound abscess   . Schizoaffective disorder [facility])     Patient Active Problem List   Diagnosis Date Noted  . Schizoaffective disorder, bipolar type (HCC)   . Schizoaffective disorder (HCC) 06/17/2015  . 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 GERD (gastroesophageal reflux disease) 09/24/2014  . COPD (chronic obstructive pulmonary disease) (HCC) 09/24/2014  . Borderline personality disorder 09/24/2014  . Cerebral palsy (HCC) 08/31/2014    Past Surgical History  Procedure Laterality Date  . Cholecystectomy    . Tonsillectomy    . Incision and drainage abscess Right 01/02/2015    Procedure: INCISION AND DRAINAGE ABSCESS;  Surgeon: Tiney Rouge III, MD;  Location: [facility] ORS;  Service: General;  Laterality: Right;    Current Outpatient Rx   Name  Route  Sig  Dispense  Refill  . acetaminophen (TYLENOL) 500 MG tablet   Oral   Take 500 mg by mouth every 8 (eight) hours as needed.         Marland Kitchen EXPIRED: amantadine (SYMMETREL) 100 MG capsule   Oral   Take 1 capsule (100 mg total) by mouth 2 (two) times daily.   60 capsule   0   . aspirin EC 81 MG tablet   Oral   Take 81 mg by mouth daily.         Marland Kitchen dicyclomine (BENTYL) 20 MG tablet   Oral   Take 20 mg by mouth 3 (three) times daily as needed for spasms.         . diphenoxylate-atropine (LOMOTIL) 2.5-0.025 MG tablet   Oral   Take 1 tablet by mouth 4 (four) times daily as needed for diarrhea or loose stools.         . divalproex (DEPAKOTE ER) 250 MG 24 hr tablet   Oral   Take 500 mg by mouth 2 (two) times daily.         Marland Kitchen escitalopram (LEXAPRO) 20 MG tablet   Oral   Take 30 mg by mouth daily.         . fenofibrate 54 MG tablet   Oral   Take 54 mg by mouth daily.         . Fluticasone-Salmeterol (ADVAIR) 100-50 MCG/DOSE AEPB   Inhalation   Inhale 1 puff into the lungs 2 (two) times daily.          Marland Kitchen  HYDROcodone-acetaminophen (NORCO/VICODIN) 5-325 MG tablet   Oral   Take 1 tablet by mouth every 4 (four) hours as needed.   10 tablet   0   . hydrOXYzine (ATARAX/VISTARIL) 50 MG tablet   Oral   Take 50 mg by mouth at bedtime.         Marland Kitchen loperamide (IMODIUM A-D) 2 MG tablet   Oral   Take 1 tablet (2 mg total) by mouth 4 (four) times daily as needed for diarrhea or loose stools.   20 tablet   0   . LORazepam (ATIVAN) 1 MG tablet   Oral   Take 1 mg by mouth 2 (two) times daily as needed for anxiety.         . meloxicam (MOBIC) 15 MG tablet   Oral   Take 15 mg by mouth daily as needed.          . miconazole (MICOTIN) 2 % powder   Topical   Apply 1 application topically 2 (two) times daily as needed for itching.         . pantoprazole (PROTONIX) 40 MG tablet   Oral   Take 40 mg by mouth 2 (two) times daily.         . QUEtiapine  (SEROQUEL) 50 MG tablet   Oral   Take 25 mg by mouth 4 (four) times daily as needed.         . risperiDONE (RISPERDAL) 3 MG tablet   Oral   Take 1 tablet (3 mg total) by mouth at bedtime.   30 tablet   0   . traMADol (ULTRAM) 50 MG tablet   Oral   Take by mouth every 6 (six) hours as needed.           Allergies Penicillins  Family History  Problem Relation Age of Onset  . Diabetes Mother   . Heart attack Father     Social History Social History  Substance Use Topics  . Smoking status: Never Smoker   . Smokeless tobacco: Never Used  . Alcohol Use: No    Review of Systems  Constitutional: Negative for fever. Cardiovascular: Positive for chest pain. Respiratory: Negative for shortness of breath. Gastrointestinal: Negative for abdominal pain, vomiting and diarrhea. Genitourinary: Negative for dysuria. Musculoskeletal: Negative for back pain. Skin: Negative for rash. Neurological: Negative for headaches, focal weakness or numbness.   10-point ROS otherwise negative.  ____________________________________________   PHYSICAL EXAM:  VITAL SIGNS: ED Triage Vitals  Enc Vitals Group     BP 10/27/15 2205 97/71 mmHg     Pulse Rate 10/27/15 2205 65     Resp 10/27/15 2205 18     Temp 10/27/15 2205 97.6 F (36.4 C)     Temp Source 10/27/15 2205 Oral     SpO2 10/27/15 2205 98 %     Weight 10/27/15 2205 188 lb (85.276 kg)     Height 10/27/15 2205 5\' 4"  (1.626 m)     Head Cir --      Peak Flow --      Pain Score 10/27/15 2206 10   Constitutional: Alert and oriented. Well appearing and in no distress. Eyes: Conjunctivae are normal. PERRL. Normal extraocular movements. ENT   Head: Normocephalic and atraumatic.   Nose: No congestion/rhinnorhea.   Mouth/Throat: Mucous membranes are moist.   Neck: No stridor. Hematological/Lymphatic/Immunilogical: No cervical lymphadenopathy. Cardiovascular: Normal rate, regular rhythm.  No murmurs, rubs, or  gallops. Respiratory: Normal respiratory effort without tachypnea nor retractions.  Breath sounds are clear and equal bilaterally. No wheezes/rales/rhonchi. Gastrointestinal: Soft and nontender. No distention. There is no CVA tenderness. Genitourinary: Deferred Musculoskeletal: Normal range of motion in all extremities. No joint effusions.  No lower extremity tenderness nor edema. Neurologic:  Normal speech and language. No gross focal neurologic deficits are appreciated.  Skin:  Skin is warm, dry and intact. No rash noted.  ____________________________________________    LABS (pertinent positives/negatives)  Labs Reviewed  BASIC METABOLIC PANEL - Abnormal; Notable for the following:    Potassium 3.3 (*)    BUN 27 (*)    All other components within normal limits  ACETAMINOPHEN LEVEL - Abnormal; Notable for the following:    Acetaminophen (Tylenol), Serum <10 (*)    All other components within normal limits  CBC  TROPONIN I  ETHANOL  SALICYLATE LEVEL  URINE DRUG SCREEN, QUALITATIVE ([facility] ONLY)     ____________________________________________   EKG  I, Phineas SemenGraydon Manjot Hinks, attending physician, personally viewed and interpreted this EKG  EKG Time: 2201 Rate: 61 Rhythm: normal sinus rhythm Axis: normal Intervals: qtc 461 QRS: narrow, q waves III ST changes: no st elevation Impression: abnormal ekg   ____________________________________________    RADIOLOGY  CXR IMPRESSION: No active cardiopulmonary disease. ____________________________________________   PROCEDURES  Procedure(s) performed: None  Critical Care performed: No  ____________________________________________   INITIAL IMPRESSION / ASSESSMENT AND PLAN / ED COURSE  Pertinent labs & imaging results that were available during my care of the patient were reviewed by me and considered in my medical decision making (see chart for details).  Patient presents to the emergency department today with chest  pain and some complaints of depression suicidal ideation. The patient's EKG and blood work without concerning findings for the chest pain. The patient has been evaluated multiple times in the ED by myself as well as psychiatry for the same complaint of suicidal ideation. It is chronic this point. There is some issues at the group home that the patient certainly has. This point I do not feel patient is a acute danger to herself. Will discharge back to her group home.  ____________________________________________   FINAL CLINICAL IMPRESSION(S) / ED DIAGNOSES  Final diagnoses:  Chest pain, unspecified chest pain type     Note: This dictation was prepared with Dragon dictation. Any transcriptional errors that result from this process are unintentional    Phineas SemenGraydon Jamir Rone, MD 10/28/15 [phone]

## 2015-10-28 NOTE — ED Notes (Signed)
Group home contacted, discharge orders went over with caregiver.

## 2015-10-28 NOTE — Discharge Instructions (Signed)
Please seek medical attention and help for any thoughts about wanting to harm herself, harm others, any concerning change in behavior, severe depression, inappropriate drug use or any other new or concerning symptoms.  Nonspecific Chest Pain It is often hard to find the cause of chest pain. There is always a chance that your pain could be related to something serious, such as a heart attack or a blood clot in your lungs. Chest pain can also be caused by conditions that are not life-threatening. If you have chest pain, it is very important to follow up with your doctor.  HOME CARE  If you were prescribed an antibiotic medicine, finish it all even if you start to feel better.  Avoid any activities that cause chest pain.  Do not use any tobacco products, including cigarettes, chewing tobacco, or electronic cigarettes. If you need help quitting, ask your doctor.  Do not drink alcohol.  Take medicines only as told by your doctor.  Keep all follow-up visits as told by your doctor. This is important. This includes any further testing if your chest pain does not go away.  Your doctor may tell you to keep your head raised (elevated) while you sleep.  Make lifestyle changes as told by your doctor. These may include:  Getting regular exercise. Ask your doctor to suggest some activities that are safe for you.  Eating a heart-healthy diet. Your doctor or a diet specialist (dietitian) can help you to learn healthy eating options.  Maintaining a healthy weight.  Managing diabetes, if necessary.  Reducing stress. GET HELP IF:  Your chest pain does not go away, even after treatment.  You have a rash with blisters on your chest.  You have a fever. GET HELP RIGHT AWAY IF:  Your chest pain is worse.  You have an increasing cough, or you cough up blood.  You have severe belly (abdominal) pain.  You feel extremely weak.  You pass out (faint).  You have chills.  You have sudden,  unexplained chest discomfort.  You have sudden, unexplained discomfort in your arms, back, neck, or jaw.  You have shortness of breath at any time.  You suddenly start to sweat, or your skin gets clammy.  You feel nauseous.  You vomit.  You suddenly feel light-headed or dizzy.  Your heart begins to beat quickly, or it feels like it is skipping beats. These symptoms may be an emergency. Do not wait to see if the symptoms will go away. Get medical help right away. Call your local emergency services (911 in the [place] Do not drive yourself to the hospital.   This information is not intended to replace advice given to you by your health care provider. Make sure you discuss any questions you have with your health care provider.   Document Released: 09/20/2007 Document Revised: 04/24/2014 Document Reviewed: 11/07/2013 Elsevier Interactive Patient Education [employer]2016 Elsevier Inc.

## 2015-11-02 ENCOUNTER — Emergency Department
Admission: EM | Admit: 2015-11-02 | Discharge: 2015-11-02 | Disposition: A | Discharge status: Home / Self Care | Payer: Medicare Other | Attending: Emergency Medicine | Admitting: Emergency Medicine

## 2015-11-02 ENCOUNTER — Encounter: Payer: Self-pay | Admitting: Intensive Care

## 2015-11-02 DIAGNOSIS — R109 Unspecified abdominal pain: Secondary | ICD-10-CM | POA: Diagnosis present

## 2015-11-02 DIAGNOSIS — Z8719 Personal history of other diseases of the digestive system: Secondary | ICD-10-CM | POA: Diagnosis not present

## 2015-11-02 DIAGNOSIS — R110 Nausea: Secondary | ICD-10-CM

## 2015-11-02 DIAGNOSIS — R197 Diarrhea, unspecified: Secondary | ICD-10-CM | POA: Diagnosis not present

## 2015-11-02 DIAGNOSIS — F250 Schizoaffective disorder, bipolar type: Secondary | ICD-10-CM | POA: Insufficient documentation

## 2015-11-02 DIAGNOSIS — J45909 Unspecified asthma, uncomplicated: Secondary | ICD-10-CM | POA: Insufficient documentation

## 2015-11-02 DIAGNOSIS — Z79899 Other long term (current) drug therapy: Secondary | ICD-10-CM | POA: Insufficient documentation

## 2015-11-02 DIAGNOSIS — J449 Chronic obstructive pulmonary disease, unspecified: Secondary | ICD-10-CM | POA: Diagnosis not present

## 2015-11-02 DIAGNOSIS — F329 Major depressive disorder, single episode, unspecified: Secondary | ICD-10-CM | POA: Diagnosis not present

## 2015-11-02 DIAGNOSIS — Z7982 Long term (current) use of aspirin: Secondary | ICD-10-CM | POA: Diagnosis not present

## 2015-11-02 DIAGNOSIS — R112 Nausea with vomiting, unspecified: Secondary | ICD-10-CM | POA: Diagnosis not present

## 2015-11-02 LAB — COMPREHENSIVE METABOLIC PANEL
ALT: 25 U/L (ref 14–54)
AST: 28 U/L (ref 15–41)
Albumin: 4 g/dL (ref 3.5–5.0)
Alkaline Phosphatase: 53 U/L (ref 38–126)
Anion gap: 11 (ref 5–15)
BUN: 32 mg/dL — AB (ref 6–20)
CHLORIDE: 104 mmol/L (ref 101–111)
CO2: 24 mmol/L (ref 22–32)
CREATININE: 0.95 mg/dL (ref 0.44–1.00)
Calcium: 9.3 mg/dL (ref 8.9–10.3)
GFR calc Af Amer: >60 mL/min (ref >60)
Glucose, Bld: 139 mg/dL — ABNORMAL HIGH (ref 65–99)
Potassium: 3.8 mmol/L (ref 3.5–5.1)
SODIUM: 139 mmol/L (ref 135–145)
Total Bilirubin: 0.8 mg/dL (ref 0.3–1.2)
Total Protein: 8 g/dL (ref 6.5–8.1)

## 2015-11-02 LAB — CBC WITH DIFFERENTIAL/PLATELET
Basophils Absolute: 0 10*3/uL (ref 0–0.1)
Basophils Relative: 0 %
EOS ABS: 0.4 10*3/uL (ref 0–0.7)
EOS PCT: 5 %
HCT: 43.8 % (ref 35.0–47.0)
Hemoglobin: 15 g/dL (ref 12.0–16.0)
LYMPHS ABS: 1.2 10*3/uL (ref 1.0–3.6)
Lymphocytes Relative: 13 %
MCH: 32.5 pg (ref 26.0–34.0)
MCHC: 34.2 g/dL (ref 32.0–36.0)
MCV: 94.9 fL (ref 80.0–100.0)
MONO ABS: 1.3 10*3/uL — AB (ref 0.2–0.9)
MONOS PCT: 14 %
Neutro Abs: 6.3 10*3/uL (ref 1.4–6.5)
Neutrophils Relative %: 68 %
PLATELETS: 295 10*3/uL (ref 150–440)
RBC: 4.62 MIL/uL (ref 3.80–5.20)
RDW: 14.1 % (ref 11.5–14.5)
WBC: 9.2 10*3/uL (ref 3.6–11.0)

## 2015-11-02 LAB — ETHANOL

## 2015-11-02 LAB — LIPASE, BLOOD: Lipase: 24 U/L (ref 11–51)

## 2015-11-02 MED ORDER — ONDANSETRON 4 MG PO TBDP: 4.0000 mg | ORAL_TABLET | Freq: Three times a day (TID) | ORAL | Status: DC | PRN

## 2015-11-02 MED ORDER — ONDANSETRON HCL 4 MG/2ML IJ SOLN
INTRAMUSCULAR | Status: AC
Start: 2015-11-02 — End: 2015-11-03
  Filled 2015-11-02: qty 2

## 2015-11-02 MED ORDER — ONDANSETRON HCL 4 MG/2ML IJ SOLN
4.0000 mg | Freq: Once | INTRAMUSCULAR | Status: AC
  Administered 2015-11-02: 4 mg via INTRAVENOUS

## 2015-11-02 MED ORDER — SODIUM CHLORIDE 0.9 % IV BOLUS (SEPSIS)
1000.0000 mL | Freq: Once | INTRAVENOUS | Status: AC
  Administered 2015-11-02: 1000 mL via INTRAVENOUS

## 2015-11-02 NOTE — ED Notes (Signed)
Pt given ginger ale PO, pt sitting up in bed tolerating well, NAD. Pt denies any n/v/d at this time

## 2015-11-02 NOTE — ED Notes (Signed)
Patient arrived by EMS from [facility]. Patient has been seen here X4 this week for N/V/D. Patient presents to the ER today for N/V/D. Patient is dry heaving upon arrival. Patient A&O x4.

## 2015-11-02 NOTE — Discharge Instructions (Signed)

## 2015-11-02 NOTE — ED Notes (Signed)
Pt drank entire cup of ginger ale with no adverse side effects. Pt tolerated well, NAD noted. MD Scotty CourtStafford made aware, no further orders given at this time.

## 2015-11-02 NOTE — ED Notes (Signed)
Patient stopped this RN in hallway and stated "I feel suicidal now, it is just in my head. Would you like me to dress out my clothes now?" I asked patient if something happened to all of a sudden bring these thoughts on and she said "I do not know." MD made aware. MD cleared patient of 1:1 and is discharging patient.

## 2015-11-02 NOTE — ED Provider Notes (Signed)
[facility] Emergency Department Provider Note  ____________________________________________  Time seen: 2:40 PM  I have reviewed the triage vital signs and the nursing notes.   HISTORY  Chief Complaint Emesis    HPI [name] is a 50 y.o. female who complains of generalized abdominal pain with vomiting and diarrhea for 24 hours. No bloody vomit or diarrhea. No chest pain shortness of breath fevers or chills. No dizziness or syncope.  No aggravating or alleviating factors. Patient reports a loss of appetite. It is nonradiating, moderate, constant.     Past Medical History  Diagnosis Date  . Asthma   . Anxiety   . GERD (gastroesophageal reflux disease)   . Mild cognitive impairment   . Cerebral palsy (HCC)   . Borderline personality disorder   . Depression   . Wound abscess   . Schizoaffective disorder [facility])      Patient Active Problem List   Diagnosis Date Noted  . Schizoaffective disorder, bipolar type (HCC)   . Schizoaffective disorder (HCC) 06/17/2015  . 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 GERD (gastroesophageal reflux disease) 09/24/2014  . COPD (chronic obstructive pulmonary disease) (HCC) 09/24/2014  . Borderline personality disorder 09/24/2014  . Cerebral palsy (HCC) 08/31/2014     Past Surgical History  Procedure Laterality Date  . Cholecystectomy    . Tonsillectomy    . Incision and drainage abscess Right 01/02/2015    Procedure: INCISION AND DRAINAGE ABSCESS;  Surgeon: Tiney Rouge III, MD;  Location: [facility] ORS;  Service: General;  Laterality: Right;     Current Outpatient Rx  Name  Route  Sig  Dispense  Refill  . acetaminophen (TYLENOL) 500 MG tablet   Oral   Take 500 mg by mouth every 8 (eight) hours as needed.         Marland Kitchen EXPIRED: amantadine (SYMMETREL) 100 MG capsule   Oral   Take 1 capsule (100 mg total) by mouth 2 (two) times daily.   60 capsule   0   . aspirin EC 81 MG tablet   Oral   Take 81 mg  by mouth daily.         Marland Kitchen dicyclomine (BENTYL) 20 MG tablet   Oral   Take 20 mg by mouth 3 (three) times daily as needed for spasms.         . diphenoxylate-atropine (LOMOTIL) 2.5-0.025 MG tablet   Oral   Take 1 tablet by mouth 4 (four) times daily as needed for diarrhea or loose stools.         . divalproex (DEPAKOTE ER) 250 MG 24 hr tablet   Oral   Take 500 mg by mouth 2 (two) times daily.         Marland Kitchen escitalopram (LEXAPRO) 20 MG tablet   Oral   Take 30 mg by mouth daily.         . fenofibrate 54 MG tablet   Oral   Take 54 mg by mouth daily.         . Fluticasone-Salmeterol (ADVAIR) 100-50 MCG/DOSE AEPB   Inhalation   Inhale 1 puff into the lungs 2 (two) times daily.          Marland Kitchen HYDROcodone-acetaminophen (NORCO/VICODIN) 5-325 MG tablet   Oral   Take 1 tablet by mouth every 4 (four) hours as needed.   10 tablet   0   . hydrOXYzine (ATARAX/VISTARIL) 50 MG tablet   Oral   Take 50 mg by mouth at bedtime.         Marland Kitchen  loperamide (IMODIUM A-D) 2 MG tablet   Oral   Take 1 tablet (2 mg total) by mouth 4 (four) times daily as needed for diarrhea or loose stools.   20 tablet   0   . LORazepam (ATIVAN) 1 MG tablet   Oral   Take 1 mg by mouth 2 (two) times daily as needed for anxiety.         . meloxicam (MOBIC) 15 MG tablet   Oral   Take 15 mg by mouth daily as needed.          . miconazole (MICOTIN) 2 % powder   Topical   Apply 1 application topically 2 (two) times daily as needed for itching.         . ondansetron (ZOFRAN ODT) 4 MG disintegrating tablet   Oral   Take 1 tablet (4 mg total) by mouth every 8 (eight) hours as needed for nausea or vomiting.   20 tablet   0   . pantoprazole (PROTONIX) 40 MG tablet   Oral   Take 40 mg by mouth 2 (two) times daily.         . QUEtiapine (SEROQUEL) 50 MG tablet   Oral   Take 25 mg by mouth 4 (four) times daily as needed.         . risperiDONE (RISPERDAL) 3 MG tablet   Oral   Take 1 tablet (3 mg  total) by mouth at bedtime.   30 tablet   0   . traMADol (ULTRAM) 50 MG tablet   Oral   Take by mouth every 6 (six) hours as needed.            Allergies Penicillins   Family History  Problem Relation Age of Onset  . Diabetes Mother   . Heart attack Father     Social History Social History  Substance Use Topics  . Smoking status: Never Smoker   . Smokeless tobacco: Never Used  . Alcohol Use: No    Review of Systems  Constitutional:   No fever or chills.  ENT:   No sore throat. No rhinorrhea. Cardiovascular:   No chest pain. Respiratory:   No dyspnea or cough. Gastrointestinal:   Positive abdominal pain vomiting and diarrhea.  Genitourinary:   Negative for dysuria or difficulty urinating. Musculoskeletal:   Negative for focal pain or swelling Neurological:   Negative for headaches 10-point ROS otherwise negative.  ____________________________________________   PHYSICAL EXAM:  VITAL SIGNS: ED Triage Vitals  Enc Vitals Group     BP 11/02/15 1441 100/80 mmHg     Pulse Rate 11/02/15 1441 96     Resp 11/02/15 1441 20     Temp 11/02/15 1441 98.8 F (37.1 C)     Temp Source 11/02/15 1441 Oral     SpO2 11/02/15 1441 94 %     Weight 11/02/15 1441 188 lb (85.276 kg)     Height 11/02/15 1441  (1.626 m)     Head Cir --      Peak Flow --      Pain Score 11/02/15 1430 10     Pain Loc --      Pain Edu? --      Excl. in GC? --     Vital signs reviewed, nursing assessments reviewed.   Constitutional:   Alert and oriented. Well appearing and in no distress. Eyes:   No scleral icterus. No conjunctival pallor. PERRL. EOMI.  No nystagmus. ENT   Head:  Normocephalic and atraumatic.   Nose:   No congestion/rhinnorhea. No septal hematoma   Mouth/Throat:   MMM, Positive pharyngeal erythema. No peritonsillar mass.    Neck:   No stridor. No SubQ emphysema. No meningismus. Hematological/Lymphatic/Immunilogical:   No cervical  lymphadenopathy. Cardiovascular:   RRR. Symmetric bilateral radial and DP pulses.  No murmurs.  Respiratory:   Normal respiratory effort without tachypnea nor retractions. Breath sounds are clear and equal bilaterally. No wheezes/rales/rhonchi. Gastrointestinal:   Soft With generalized mild tenderness, no focal findings. Non distended. There is no CVA tenderness.  No rebound, rigidity, or guarding. Genitourinary:   deferred Musculoskeletal:   Nontender with normal range of motion in all extremities. No joint effusions.  No lower extremity tenderness.  No edema. Neurologic:   Normal speech and language.  CN 2-10 normal. Motor grossly intact. No gross focal neurologic deficits are appreciated.  Skin:    Skin is warm, dry and intact. No rash noted.  No petechiae, purpura, or bullae.  ____________________________________________    LABS (pertinent positives/negatives) (all labs ordered are listed, but only abnormal results are displayed) Labs Reviewed  COMPREHENSIVE METABOLIC PANEL - Abnormal; Notable for the following:    Glucose, Bld 139 (*)    BUN 32 (*)    All other components within normal limits  CBC WITH DIFFERENTIAL/PLATELET - Abnormal; Notable for the following:    Monocytes Absolute 1.3 (*)    All other components within normal limits  ETHANOL  LIPASE, BLOOD   ____________________________________________   EKG    ____________________________________________    RADIOLOGY    ____________________________________________   PROCEDURES   ____________________________________________   INITIAL IMPRESSION / ASSESSMENT AND PLAN / ED COURSE  Pertinent labs & imaging results that were available during my care of the patient were reviewed by me and considered in my medical decision making (see chart for details).  Patient well appearing no acute distress, unremarkable vital signs. Appears to likely have a viral gastroenteritis.Considering the patient's symptoms,  medical history, and physical examination today, I have low suspicion for cholecystitis or biliary pathology, pancreatitis, perforation or bowel obstruction, hernia, intra-abdominal abscess, AAA or dissection, volvulus or intussusception, mesenteric ischemia, or appendicitis.  Give IV fluids check labs Zofran and by mouth challenge. Plan for outpatient follow-up.  ----------------------------------------- 4:35 PM on 11/02/2015 -----------------------------------------      Nad/ tol PO. Now reports some SI, but no plan.  Has long psych hx, with multiple complaints of SI in the past.  Per multiple psych notes this is chronic issue, more related to manipulative behavior.  I do not think that she is a danger to herself or others is no indication for involuntary commitment or psychiatric consultation at this time. We'll discharge back to her group home to follow up with her outpatient providers. ____________________________________________   FINAL CLINICAL IMPRESSION(S) / ED DIAGNOSES  Final diagnoses:  Schizoaffective disorder, bipolar type (HCC)  Nausea       Portions of this note were generated with dragon dictation software. Dictation errors may occur despite best attempts at proofreading.   Sharman CheekPhillip Keimani Laufer, MD 11/02/15 [phone]

## 2015-11-03 MED ORDER — MORPHINE SULFATE (PF) 4 MG/ML IV SOLN
INTRAVENOUS | Status: AC
  Filled 2015-11-03: qty 1

## 2015-11-05 ENCOUNTER — Emergency Department
Admission: EM | Admit: 2015-11-05 | Discharge: 2015-11-05 | Disposition: A | Discharge status: Home / Self Care | Payer: Medicare Other | Attending: Emergency Medicine | Admitting: Emergency Medicine

## 2015-11-05 ENCOUNTER — Encounter: Payer: Self-pay | Admitting: Emergency Medicine

## 2015-11-05 DIAGNOSIS — R45851 Suicidal ideations: Secondary | ICD-10-CM | POA: Diagnosis not present

## 2015-11-05 DIAGNOSIS — Z79899 Other long term (current) drug therapy: Secondary | ICD-10-CM | POA: Diagnosis not present

## 2015-11-05 DIAGNOSIS — J449 Chronic obstructive pulmonary disease, unspecified: Secondary | ICD-10-CM | POA: Insufficient documentation

## 2015-11-05 DIAGNOSIS — J45909 Unspecified asthma, uncomplicated: Secondary | ICD-10-CM | POA: Insufficient documentation

## 2015-11-05 DIAGNOSIS — F251 Schizoaffective disorder, depressive type: Secondary | ICD-10-CM | POA: Diagnosis not present

## 2015-11-05 DIAGNOSIS — Z7982 Long term (current) use of aspirin: Secondary | ICD-10-CM | POA: Insufficient documentation

## 2015-11-05 DIAGNOSIS — Z791 Long term (current) use of non-steroidal anti-inflammatories (NSAID): Secondary | ICD-10-CM | POA: Insufficient documentation

## 2015-11-05 LAB — COMPREHENSIVE METABOLIC PANEL
ALBUMIN: 3.7 g/dL (ref 3.5–5.0)
ALT: 27 U/L (ref 14–54)
ANION GAP: 9 (ref 5–15)
AST: 32 U/L (ref 15–41)
Alkaline Phosphatase: 49 U/L (ref 38–126)
BUN: 17 mg/dL (ref 6–20)
CHLORIDE: 100 mmol/L — AB (ref 101–111)
CO2: 29 mmol/L (ref 22–32)
Calcium: 8.9 mg/dL (ref 8.9–10.3)
Creatinine, Ser: 0.74 mg/dL (ref 0.44–1.00)
GFR calc Af Amer: >60 mL/min (ref >60)
GLUCOSE: 93 mg/dL (ref 65–99)
POTASSIUM: 3.3 mmol/L — AB (ref 3.5–5.1)
Sodium: 138 mmol/L (ref 135–145)
TOTAL PROTEIN: 6.9 g/dL (ref 6.5–8.1)
Total Bilirubin: 0.4 mg/dL (ref 0.3–1.2)

## 2015-11-05 LAB — CBC
HEMATOCRIT: 37.1 % (ref 35.0–47.0)
HEMOGLOBIN: 13.1 g/dL (ref 12.0–16.0)
MCH: 33.1 pg (ref 26.0–34.0)
MCHC: 35.3 g/dL (ref 32.0–36.0)
MCV: 94 fL (ref 80.0–100.0)
Platelets: 241 10*3/uL (ref 150–440)
RBC: 3.95 MIL/uL (ref 3.80–5.20)
RDW: 13.6 % (ref 11.5–14.5)
WBC: 6.7 10*3/uL (ref 3.6–11.0)

## 2015-11-05 LAB — ACETAMINOPHEN LEVEL

## 2015-11-05 LAB — ETHANOL: Alcohol, Ethyl (B): <5 mg/dL (ref <5)

## 2015-11-05 LAB — URINE DRUG SCREEN, QUALITATIVE (ARMC ONLY)
AMPHETAMINES, UR SCREEN: NOT DETECTED
BENZODIAZEPINE, UR SCRN: POSITIVE — AB
Barbiturates, Ur Screen: NOT DETECTED
COCAINE METABOLITE, UR ~~LOC~~: NOT DETECTED
Cannabinoid 50 Ng, Ur ~~LOC~~: NOT DETECTED
MDMA (ECSTASY) UR SCREEN: NOT DETECTED
METHADONE SCREEN, URINE: NOT DETECTED
OPIATE, UR SCREEN: NOT DETECTED
PHENCYCLIDINE (PCP) UR S: NOT DETECTED
Tricyclic, Ur Screen: NOT DETECTED

## 2015-11-05 LAB — SALICYLATE LEVEL: Salicylate Lvl: <4 mg/dL (ref 2.8–30.0)

## 2015-11-05 NOTE — ED Provider Notes (Addendum)
[facility] Emergency Department Provider Note        Time seen: ----------------------------------------- 5:09 PM on 11/05/2015 -----------------------------------------    I have reviewed the triage vital signs and the nursing notes.   HISTORY  Chief Complaint Suicidal    HPI Sherri DonningKimberly Delbuono is a 50 y.o. female who presents to ER for suicidal ideation. Patient is a resident of a group homeand has a chronic history of this. She does have a history of schizoaffective disorder, borderline personality and cognitive impairment. She's been seen numerous times for suicidal thoughts in the past. She states someone at the group home stated they wanted her dead. She feels like walking out into traffic.   Past Medical History  Diagnosis Date  . Asthma   . Anxiety   . GERD (gastroesophageal reflux disease)   . Mild cognitive impairment   . Cerebral palsy (HCC)   . Borderline personality disorder   . Depression   . Wound abscess   . Schizoaffective disorder [facility](HCC)     Patient Active Problem List   Diagnosis Date Noted  . Schizoaffective disorder, bipolar type (HCC)   . Schizoaffective disorder (HCC) 06/17/2015  . 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 GERD (gastroesophageal reflux disease) 09/24/2014  . COPD (chronic obstructive pulmonary disease) (HCC) 09/24/2014  . Borderline personality disorder 09/24/2014  . Cerebral palsy (HCC) 08/31/2014    Past Surgical History  Procedure Laterality Date  . Cholecystectomy    . Tonsillectomy    . Incision and drainage abscess Right 01/02/2015    Procedure: INCISION AND DRAINAGE ABSCESS;  Surgeon: Tiney Rougealph Ely III, MD;  Location: [facility] ORS;  Service: General;  Laterality: Right;    Allergies Penicillins  Social History Social History  Substance Use Topics  . Smoking status: Never Smoker   . Smokeless tobacco: Never Used  . Alcohol Use: No    Review of Systems Constitutional: Negative for  fever. Cardiovascular: Negative for chest pain. Respiratory: Negative for shortness of breath. Gastrointestinal: Negative for abdominal pain, vomiting and diarrhea. Genitourinary: Negative for dysuria. Musculoskeletal: Negative for back pain. Skin: Negative for rash. Neurological: Negative for headaches, focal weakness or numbness. Psychiatric: Positive for suicidal ideation  10-point ROS otherwise negative.  ____________________________________________   PHYSICAL EXAM:  VITAL SIGNS: ED Triage Vitals  Enc Vitals Group     BP 11/05/15 1702 100/70 mmHg     Pulse Rate 11/05/15 1702 74     Resp 11/05/15 1702 20     Temp 11/05/15 1702 99 F (37.2 C)     Temp Source 11/05/15 1702 Oral     SpO2 11/05/15 1702 97 %     Weight 11/05/15 1702 188 lb (85.276 kg)     Height 11/05/15 1702 5\' 4"  (1.626 m)     Head Cir --      Peak Flow --      Pain Score 11/05/15 1705 0     Pain Loc --      Pain Edu? --      Excl. in GC? --     Constitutional: Alert and oriented. Well appearing and in no distress. Eyes: Conjunctivae are normal. PERRL. Normal extraocular movements. ENT   Head: Normocephalic and atraumatic.   Nose: No congestion/rhinnorhea.   Mouth/Throat: Mucous membranes are moist.   Neck: No stridor. Cardiovascular: Normal rate, regular rhythm. No murmurs, rubs, or gallops. Respiratory: Normal respiratory effort without tachypnea nor retractions. Breath sounds are clear and equal bilaterally. No wheezes/rales/rhonchi. Gastrointestinal: Soft and nontender. Normal  bowel sounds Musculoskeletal: Nontender with normal range of motion in all extremities. No lower extremity tenderness nor edema. Neurologic:  Normal speech and language. No gross focal neurologic deficits are appreciated.  Skin:  Skin is warm, dry and intact. No rash noted. Psychiatric: Mood and affect are normal. Patient states she is suicidal. ____________________________________________  ED  COURSE:  Pertinent labs & imaging results that were available during my care of the patient were reviewed by me and considered in my medical decision making (see chart for details). Patient is in no acute distress, she is well known to me, not felt to be a threat to herself. She is stable to return to the group home.  ____________________________________________  FINAL ASSESSMENT AND PLAN  Suicidal ideation  Plan: Patient  ascribes to suicidal thoughts which she has frequently. She is not felt to be a threat to herself at this time. She is stable for outpatient follow-up with her psychiatrist.  Emily Filbert, MD   Note: This dictation was prepared with Dragon dictation. Any transcriptional errors that result from this process are unintentional   Emily Filbert, MD 11/05/15 1711  Emily Filbert, MD 11/05/15 [phone]

## 2015-11-05 NOTE — Consult Note (Signed)
[facility] Face-to-Face Psychiatry Consult   Reason for Consult:  Consult for this 50 year old woman with schizoaffective disorder and mild intellectual disability and cerebral palsy. Referring Physician:  Mayford Knife Patient Identification: Jonessa Triplett MRN:  [id number] Principal Diagnosis: Schizoaffective disorder Diagnosis:   Patient Active Problem List   Diagnosis Date Noted  . Schizoaffective disorder, bipolar type (HCC) [F25.0]   . Schizoaffective disorder (HCC) [F25.9] 06/17/2015  . 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 GERD (gastroesophageal reflux disease) [K21.9] 09/24/2014  . COPD (chronic obstructive pulmonary disease) (HCC) [J44.9] 09/24/2014  . Borderline personality disorder [F60.3] 09/24/2014  . Cerebral palsy (HCC) [G80.9] 08/31/2014    Total Time spent with patient: 30 minutes  Subjective:   Brittannie Tawney is a 50 y.o. female patient admitted with "I don't want to stay at the group home".  HPI:  Patient interviewed. Chart reviewed. Case discussed with emergency room doctor. Patient is well known to [place] from multiple prior visits to the emergency room. She had her self brought over here voluntarily from her group home today. She says that yesterday one of the other residents made a rude and hurtful comment to her and so today she decided to come over to the hospital. Patient has usual says that she is "suicidal". At the same time she says that if we send her back home she will probably spend the weekend just hanging around watching television. She doesn't have any actual plan or intent or wish to kill her self. Patient is not reporting active psychotic symptoms. She says she's been taking her medicine as prescribed although she thinks her outpatient psychiatrist they have changed some of her medicine. Not abusing drugs or alcohol.  Past Psychiatric History: Multiple prior visits to the emergency room under identical circumstances. When she gets frustrated or unhappy at  her group home she will ask to be brought to the emergency room. She will always say that she is suicidal but usually calm down pretty quickly. We can return her to her group home and she does not act out in a dangerous way. Patient has appropriate outpatient psychiatric treatment in place and intact support from the group home and her family.  Risk to Self: Is patient at risk for suicide?: Yes Risk to Others:   Prior Inpatient Therapy:   Prior Outpatient Therapy:    Past Medical History:  Past Medical History  Diagnosis Date  . Asthma   . Anxiety   . GERD (gastroesophageal reflux disease)   . Mild cognitive impairment   . Cerebral palsy (HCC)   . Borderline personality disorder   . Depression   . Wound abscess   . Schizoaffective disorder [facility])     Past Surgical History  Procedure Laterality Date  . Cholecystectomy    . Tonsillectomy    . Incision and drainage abscess Right 01/02/2015    Procedure: INCISION AND DRAINAGE ABSCESS;  Surgeon: Tiney Rouge III, MD;  Location: [facility] ORS;  Service: General;  Laterality: Right;   Family History:  Family History  Problem Relation Age of Onset  . Diabetes Mother   . Heart attack Father    Family Psychiatric  History: Denies family history of mental illness Social History:  History  Alcohol Use No     History  Drug Use No    Social History   Social History  . Marital Status: Single    Spouse Name: N/A  . Number of Children: N/A  . Years of Education: N/A   Social History Main  Topics  . Smoking status: Never Smoker   . Smokeless tobacco: Never Used  . Alcohol Use: No  . Drug Use: No  . Sexual Activity: Yes    Birth Control/ Protection: Implant   Other Topics Concern  . None   Social History Narrative   Additional Social History:    Allergies:   Allergies  Allergen Reactions  . Penicillins Other (See Comments)    Reaction:  Unknown     Labs: No results found for this or any previous visit (from the past 48  hour(s)).  No current facility-administered medications for this encounter.   Current Outpatient Prescriptions  Medication Sig Dispense Refill  . acetaminophen (TYLENOL) 500 MG tablet Take 500 mg by mouth every 8 (eight) hours as needed.    Marland Kitchen amantadine (SYMMETREL) 100 MG capsule Take 1 capsule (100 mg total) by mouth 2 (two) times daily. 60 capsule 0  . aspirin EC 81 MG tablet Take 81 mg by mouth daily.    Marland Kitchen dicyclomine (BENTYL) 20 MG tablet Take 20 mg by mouth 3 (three) times daily as needed for spasms.    . diphenoxylate-atropine (LOMOTIL) 2.5-0.025 MG tablet Take 1 tablet by mouth 4 (four) times daily as needed for diarrhea or loose stools.    . divalproex (DEPAKOTE ER) 250 MG 24 hr tablet Take 500 mg by mouth 2 (two) times daily.    Marland Kitchen escitalopram (LEXAPRO) 20 MG tablet Take 30 mg by mouth daily.    . fenofibrate 54 MG tablet Take 54 mg by mouth daily.    . Fluticasone-Salmeterol (ADVAIR) 100-50 MCG/DOSE AEPB Inhale 1 puff into the lungs 2 (two) times daily.     Marland Kitchen HYDROcodone-acetaminophen (NORCO/VICODIN) 5-325 MG tablet Take 1 tablet by mouth every 4 (four) hours as needed. 10 tablet 0  . hydrOXYzine (ATARAX/VISTARIL) 50 MG tablet Take 50 mg by mouth at bedtime.    Marland Kitchen loperamide (IMODIUM A-D) 2 MG tablet Take 1 tablet (2 mg total) by mouth 4 (four) times daily as needed for diarrhea or loose stools. 20 tablet 0  . LORazepam (ATIVAN) 1 MG tablet Take 1 mg by mouth 2 (two) times daily as needed for anxiety.    . meloxicam (MOBIC) 15 MG tablet Take 15 mg by mouth daily as needed.     . miconazole (MICOTIN) 2 % powder Apply 1 application topically 2 (two) times daily as needed for itching.    . ondansetron (ZOFRAN ODT) 4 MG disintegrating tablet Take 1 tablet (4 mg total) by mouth every 8 (eight) hours as needed for nausea or vomiting. 20 tablet 0  . pantoprazole (PROTONIX) 40 MG tablet Take 40 mg by mouth 2 (two) times daily.    . QUEtiapine (SEROQUEL) 50 MG tablet Take 25 mg by mouth 4  (four) times daily as needed.    . risperiDONE (RISPERDAL) 3 MG tablet Take 1 tablet (3 mg total) by mouth at bedtime. 30 tablet 0  . traMADol (ULTRAM) 50 MG tablet Take by mouth every 6 (six) hours as needed.      Musculoskeletal: Strength & Muscle Tone: spastic Gait & Station: normal Patient leans: N/A  Psychiatric Specialty Exam: Physical Exam  Constitutional: She appears well-developed and well-nourished.  HENT:  Head: Normocephalic and atraumatic.  Eyes: Conjunctivae are normal. Pupils are equal, round, and reactive to light.  Neck: Normal range of motion.  Cardiovascular: Normal heart sounds.   Respiratory: Effort normal.  GI: Soft.  Musculoskeletal: Normal range of motion.  Neurological: She  is alert.  Skin: Skin is warm and dry.  Psychiatric: Her speech is normal and behavior is normal. Thought content normal. Her mood appears anxious. Cognition and memory are impaired. She expresses impulsivity.    Review of Systems  Constitutional: Negative.   HENT: Negative.   Eyes: Negative.   Respiratory: Negative.   Cardiovascular: Negative.   Gastrointestinal: Negative.   Musculoskeletal: Negative.   Skin: Negative.   Neurological: Negative.   Psychiatric/Behavioral: Positive for depression and suicidal ideas. Negative for hallucinations, memory loss and substance abuse. The patient is nervous/anxious. The patient does not have insomnia.     Blood pressure 100/70, pulse 74, temperature 99 F (37.2 C), temperature source Oral, resp. rate 20, height 5\' 4"  (1.626 m), weight 85.276 kg (188 lb), SpO2 97 %.Body mass index is 32.25 kg/(m^2).  General Appearance: Fairly Groomed  Eye Contact:  Good  Speech:  Slow  Volume:  Decreased  Mood:  Anxious  Affect:  Blunt  Thought Process:  Goal Directed  Orientation:  Full (Time, Place, and Person)  Thought Content:  Logical  Suicidal Thoughts:  Yes.  without intent/plan  Homicidal Thoughts:  No  Memory:  Immediate;   Good Recent;    Fair Remote;   Fair  Judgement:  Impaired  Insight:  Shallow  Psychomotor Activity:  Decreased  Concentration:  Concentration: Poor  Recall:  FiservFair  Fund of Knowledge:  Fair  Language:  Fair  Akathisia:  No  Handed:  Right  AIMS (if indicated):     Assets:  ArchitectCommunication Skills Financial Resources/Insurance Housing Social Support  ADL's:  Intact  Cognition:  Impaired,  Mild  Sleep:        Treatment Plan Summary: Plan 50 year old woman with borderline intellectual functioning autistic behavior schizoaffective disorder diagnosis. Saying that she is "suicidal" but without any actual intention of harming herself. Patient is no different in her mental state and behavior than on prior evaluations. Not likely to benefit from inpatient psychiatric hospitalization. Case reviewed with emergency room doctor and TTS. Patient does not require any change in her medicine and she can be discharged home. She will follow-[facility].  Disposition: Patient does not meet criteria for psychiatric inpatient admission. Supportive therapy provided about ongoing stressors.  Mordecai RasmussenJohn Riddick Nuon, MD 11/05/2015 5:57 PM

## 2015-11-05 NOTE — ED Notes (Signed)
Dr.Clapacs at bedside  

## 2015-11-05 NOTE — ED Notes (Signed)
Pt called ems after having an argument with another resident from her retirement center and stated that she wanted to walk into traffic and kill herself. Pt has hx of SI. Pt alert & oriented with NAD noted.

## 2015-11-05 NOTE — Discharge Instructions (Signed)
Suicidal Feelings: How to Help Yourself °Suicide is the taking of one's own life. If you feel as though life is getting too tough to handle and are thinking about suicide, get help right away. To get help: °· Call your local emergency services (911 in the [place] °· Call a suicide hotline to speak with a trained counselor who understands how you are feeling. The following is a list of suicide hotlines in the [place]. For a list of hotlines in [place], visit www.suicide.org/hotlines/international/[place]-suicide-hotlines.html. °¨  1-800-273-TALK (1-800-273-8255). °¨  1-800-SUICIDE (1-800-784-2433). °¨  1-888-628-9454. This is a hotline for Spanish speakers. °¨  1-800-799-4TTY (1-800-799-4889). This is a hotline for TTY users. °¨  1-866-4-U-TREVOR (1-866-488-7386). This is a hotline for lesbian, gay, bisexual, transgender, or questioning youth. °· Contact a crisis center or a local suicide prevention center. To find a crisis center or suicide prevention center: °¨ Call your local hospital, clinic, community service organization, mental health center, social service provider, or health department. Ask for assistance in connecting to a crisis center. °¨ Visit www.suicidepreventionlifeline.org/getinvolved/locator for a list of crisis centers in the [place], or visit www.suicideprevention.ca/thinking-about-suicide/find-a-crisis-centre for a list of centers in [place]. °· Visit the following websites: °¨  National Suicide Prevention Lifeline: www.suicidepreventionlifeline.org °¨  Hopeline: www.hopeline.com °¨  American Foundation for Suicide Prevention: www.afsp.org °¨  The Trevor Project (for lesbian, gay, bisexual, transgender, or questioning youth): www.thetrevorproject.org °HOW CAN I HELP MYSELF FEEL BETTER? °· Promise yourself that you will not do anything drastic when you have suicidal feelings. Remember, there is hope. Many people have gotten through suicidal thoughts and feelings, and you will, too. You may  have gotten through them before, and this proves that you can get through them again. °· Let family, friends, teachers, or counselors know how you are feeling. Try not to isolate yourself from those who care about you. Remember, they will want to help you. Talk with someone every day, even if you do not feel sociable. Face-to-face conversation is best. °· Call a mental health professional and see one regularly. °· Visit your primary health care provider every year. °· Eat a well-balanced diet, and space your meals so you eat regularly. °· Get plenty of rest. °· Avoid alcohol and drugs, and remove them from your home. They will only make you feel worse. °· If you are thinking of taking a lot of medicine, give your medicine to someone who can give it to you one day at a time. If you are on antidepressants and are concerned you will overdose, let your health care provider know so he or she can give you safer medicines. Ask your mental health professional about the possible side effects of any medicines you are taking. °· Remove weapons, poisons, knives, and anything else that could harm you from your home. °· Try to stick to routines. Follow a schedule every day. Put self-care on your schedule. °· Make a list of realistic goals, and cross them off when you achieve them. Accomplishments give a sense of worth. °· Wait until you are feeling better before doing the things you find difficult or unpleasant. °· Exercise if you are able. You will feel better if you exercise for even a half hour each day. °· Go out in the sun or into nature. This will help you recover from depression faster. If you have a favorite place to walk, go there. °· Do the things that have always given you pleasure. Play your favorite music, read a good book, paint a picture, play your favorite instrument, or do anything   else that takes your mind off your depression if it is safe to do. °· Keep your living space well lit. °· When you are feeling well,  write yourself a letter about tips and support that you can read when you are not feeling well. °· Remember that life's difficulties can be sorted out with help. Conditions can be treated. You can work on thoughts and strategies that serve you well. °  °This information is not intended to replace advice given to you by your health care provider. Make sure you discuss any questions you have with your health care provider. °  °Document Released: 10/08/2002 Document Revised: 04/24/2014 Document Reviewed: 07/29/2013 °Elsevier Interactive Patient Education ©2016 Elsevier Inc. ° °

## 2015-11-05 NOTE — ED Notes (Signed)
Pt informed this nurse that she just "almost walked out the door and into the street. That's how suicidal I am." Informed pt that this would not be prudent and that she would not have been allowed to walk out.

## 2015-11-06 ENCOUNTER — Emergency Department
Admission: EM | Admit: 2015-11-06 | Discharge: 2015-11-06 | Disposition: A | Discharge status: Home / Self Care | Payer: Medicare Other | Attending: Emergency Medicine | Admitting: Emergency Medicine

## 2015-11-06 ENCOUNTER — Encounter: Payer: Self-pay | Admitting: Emergency Medicine

## 2015-11-06 DIAGNOSIS — F259 Schizoaffective disorder, unspecified: Secondary | ICD-10-CM | POA: Diagnosis not present

## 2015-11-06 DIAGNOSIS — Z7982 Long term (current) use of aspirin: Secondary | ICD-10-CM | POA: Diagnosis not present

## 2015-11-06 DIAGNOSIS — Z79899 Other long term (current) drug therapy: Secondary | ICD-10-CM | POA: Diagnosis not present

## 2015-11-06 DIAGNOSIS — F79 Unspecified intellectual disabilities: Secondary | ICD-10-CM | POA: Insufficient documentation

## 2015-11-06 DIAGNOSIS — J45909 Unspecified asthma, uncomplicated: Secondary | ICD-10-CM | POA: Diagnosis not present

## 2015-11-06 DIAGNOSIS — R45851 Suicidal ideations: Secondary | ICD-10-CM | POA: Diagnosis present

## 2015-11-06 DIAGNOSIS — F250 Schizoaffective disorder, bipolar type: Secondary | ICD-10-CM | POA: Insufficient documentation

## 2015-11-06 NOTE — ED Provider Notes (Signed)
[facility] Emergency Department Provider Note  ____________________________________________  Time seen: Approximately 3:56 PM  I have reviewed the triage vital signs and the nursing notes.   HISTORY  Chief Complaint Suicidal    HPI [name] is a 50 y.o. female presents for evaluation of suicidal ideation.  Patient tells me that she was seen in the ER yesterday. Her thoughts of wanting to harm herself came back today, and she began starting to "harm herself" with an eraser from a pencil. She reports that she tried rubbing it into her wrist and attempt to cut her wrist, but it did not work.  She reports she continues to feel like she wants to hurt herself. In talking with the patient she reports that this is been going on for years, and it almost every day she feels somewhat suicidal. She acts out from time to time when she feels like hurting herself. She denies wanting to stab herself, walk into traffic, overdose, or cause other harm. She reports right now to assure herself or plantar be to continue to rub herself with an eraser until it cut her arm.  Denies fevers, chills, recent illness. He does not want to harm anyone else. Denies hallucinations.   Past Medical History  Diagnosis Date  . Asthma   . Anxiety   . GERD (gastroesophageal reflux disease)   . Mild cognitive impairment   . Cerebral palsy (HCC)   . Borderline personality disorder   . Depression   . Wound abscess   . Schizoaffective disorder [facility])     Patient Active Problem List   Diagnosis Date Noted  . Schizoaffective disorder, bipolar type (HCC)   . Schizoaffective disorder (HCC) 06/17/2015  . 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 GERD (gastroesophageal reflux disease) 09/24/2014  . COPD (chronic obstructive pulmonary disease) (HCC) 09/24/2014  . Borderline personality disorder 09/24/2014  . Cerebral palsy (HCC) 08/31/2014    Past Surgical History  Procedure  Laterality Date  . Cholecystectomy    . Tonsillectomy    . Incision and drainage abscess Right 01/02/2015    Procedure: INCISION AND DRAINAGE ABSCESS;  Surgeon: Tiney Rouge III, MD;  Location: [facility] ORS;  Service: General;  Laterality: Right;    Current Outpatient Rx  Name  Route  Sig  Dispense  Refill  . acetaminophen (TYLENOL) 500 MG tablet   Oral   Take 500 mg by mouth every 8 (eight) hours as needed.         Marland Kitchen EXPIRED: amantadine (SYMMETREL) 100 MG capsule   Oral   Take 1 capsule (100 mg total) by mouth 2 (two) times daily.   60 capsule   0   . aspirin EC 81 MG tablet   Oral   Take 81 mg by mouth daily.         Marland Kitchen dicyclomine (BENTYL) 20 MG tablet   Oral   Take 20 mg by mouth 3 (three) times daily as needed for spasms.         . diphenoxylate-atropine (LOMOTIL) 2.5-0.025 MG tablet   Oral   Take 1 tablet by mouth 4 (four) times daily as needed for diarrhea or loose stools.         . divalproex (DEPAKOTE ER) 250 MG 24 hr tablet   Oral   Take 500 mg by mouth 2 (two) times daily.         Marland Kitchen escitalopram (LEXAPRO) 20 MG tablet   Oral   Take 30 mg by mouth daily.         Marland Kitchen  fenofibrate 54 MG tablet   Oral   Take 54 mg by mouth daily.         . Fluticasone-Salmeterol (ADVAIR) 100-50 MCG/DOSE AEPB   Inhalation   Inhale 1 puff into the lungs 2 (two) times daily.          Marland Kitchen HYDROcodone-acetaminophen (NORCO/VICODIN) 5-325 MG tablet   Oral   Take 1 tablet by mouth every 4 (four) hours as needed.   10 tablet   0   . hydrOXYzine (ATARAX/VISTARIL) 50 MG tablet   Oral   Take 50 mg by mouth at bedtime.         Marland Kitchen loperamide (IMODIUM A-D) 2 MG tablet   Oral   Take 1 tablet (2 mg total) by mouth 4 (four) times daily as needed for diarrhea or loose stools.   20 tablet   0   . LORazepam (ATIVAN) 1 MG tablet   Oral   Take 1 mg by mouth 2 (two) times daily as needed for anxiety.         . meloxicam (MOBIC) 15 MG tablet   Oral   Take 15 mg by mouth daily  as needed.          . miconazole (MICOTIN) 2 % powder   Topical   Apply 1 application topically 2 (two) times daily as needed for itching.         . ondansetron (ZOFRAN ODT) 4 MG disintegrating tablet   Oral   Take 1 tablet (4 mg total) by mouth every 8 (eight) hours as needed for nausea or vomiting.   20 tablet   0   . pantoprazole (PROTONIX) 40 MG tablet   Oral   Take 40 mg by mouth 2 (two) times daily.         . QUEtiapine (SEROQUEL) 50 MG tablet   Oral   Take 25 mg by mouth 4 (four) times daily as needed.         . risperiDONE (RISPERDAL) 3 MG tablet   Oral   Take 1 tablet (3 mg total) by mouth at bedtime.   30 tablet   0   . traMADol (ULTRAM) 50 MG tablet   Oral   Take by mouth every 6 (six) hours as needed.           Allergies Penicillins  Family History  Problem Relation Age of Onset  . Diabetes Mother   . Heart attack Father     Social History Social History  Substance Use Topics  . Smoking status: Never Smoker   . Smokeless tobacco: Never Used  . Alcohol Use: No    Review of Systems Constitutional: No fever/chills Eyes: No visual changes. ENT: No sore throat. Cardiovascular: Denies chest pain. Respiratory: Denies shortness of breath. Gastrointestinal: No abdominal pain.  No nausea, no vomiting.  No diarrhea.  No constipation. Genitourinary: Negative for dysuria. Musculoskeletal: Negative for back pain. Skin: Negative for rash. Neurological: Negative for headaches, focal weakness or numbness.  10-point ROS otherwise negative.  ____________________________________________   PHYSICAL EXAM:  VITAL SIGNS: ED Triage Vitals  Enc Vitals Group     BP 11/06/15 1452 105/73 mmHg     Pulse Rate 11/06/15 1452 110     Resp 11/06/15 1452 20     Temp 11/06/15 1452 98.5 F (36.9 C)     Temp Source 11/06/15 1452 Oral     SpO2 11/06/15 1450 97 %     Weight 11/06/15 1452 188 lb (85.276 kg)  Height 11/06/15 1452  (1.626 m)     Head  Cir --      Peak Flow --      Pain Score 11/06/15 1453 0     Pain Loc --      Pain Edu? --      Excl. in GC? --    Constitutional: Alert and oriented. Well appearing and in no acute distress.Sitting upright, drinking water at the bedside. Eyes: Conjunctivae are normal. PERRL. EOMI. Head: Atraumatic. Nose: No congestion/rhinnorhea. Mouth/Throat: Mucous membranes are moist.  Oropharynx non-erythematous. Neck: No stridor.   Cardiovascular: Normal rate, regular rhythm. Grossly normal heart sounds.  Good peripheral circulation. Respiratory: Normal respiratory effort.  No retractions. Lungs CTAB. Gastrointestinal: Soft and nontender. No distention. No abdominal bruits. No CVA tenderness. Musculoskeletal: No lower extremity tenderness nor edema.  No joint effusions. Neurologic:  Normal speech and language. No gross focal neurologic deficits are appreciated. No gait instability. Skin:  Skin is warm, dry and intact. No rash noted. Psychiatric: Mood and affect are normal. Speech and behavior are normal.  ____________________________________________   LABS (all labs ordered are listed, but only abnormal results are displayed)  Labs Reviewed - No data to display ____________________________________________  EKG   ____________________________________________  RADIOLOGY   ____________________________________________   PROCEDURES  Procedure(s) performed: None  Critical Care performed: No  ____________________________________________   INITIAL IMPRESSION / ASSESSMENT AND PLAN / ED COURSE  Pertinent labs & imaging results that were available during my care of the patient were reviewed by me and considered in my medical decision making (see chart for details).  Patient with a history of schizophrenia, and long-standing history of presentations for suicidal ideation. Today her story and telling me that she is going to harm herself by causing injury with a pencil eraser seems fairly  far-fetched in its ability to actually cause her any trauma or arm. Yesterday she reports she wanted to walk into traffic, but today she reports she wants to cut her wrist by rubbing it with a pencil eraser.  The patient has a long-standing history of suicidal ideation, and I suspect she is likely close to her baseline which is daily almost consistent suicidal thoughts but her action today seems very unlikely to cause her any harm with eraser.  The patient is voluntary, and she wishes to speak to a psychiatrist. I have placed a total psych consultation for her at this time.  ----------------------------------------- 8:49 PM on 11/06/2015 -----------------------------------------  Patient seen and evaluated by Dr. Maricela Bo of psychiatry, and he advises discharging the patient back to her boarding home with outpatient psychiatric follow-up. The patient is calm, in no distress. ____________________________________________   FINAL CLINICAL IMPRESSION(S) / ED DIAGNOSES  Final diagnoses:  Intellectual disability  Schizoaffective disorder, bipolar type (HCC)      Sharyn Creamer, MD 11/06/15 2050

## 2015-11-06 NOTE — Progress Notes (Signed)
Per Denice Bors, NP does not meet inpatient criteria. Collateral gathered form group home, T[facility] Sn[facility] who states that once pt is ready for d/c can make arrangements for transport. Margaurite Salido K. Sherlon Handing, LPC-A, [facility]  Counselor 11/06/2015 5:36 PM

## 2015-11-06 NOTE — BH Assessment (Signed)
Tele Assessment Note   [name] is an 50 y.o. female, Caucasian who presents to [facility] after calling ambulance form group home with SI. Patient states her primary concern is of SI thoughts and plan with eraser to wrist. Patient currently resides at [facility]. Per Tamsen Snider at [facility], does not feel pt. Is danger to self or others, and states that this is pt. Baseline, and she does it for attention when pt. Feels she does nit get attention form others.  Patient acknowledges current SI with questionable plan, and per patient plan to take eraser to wrist [pt. gestured rubbing and eraser on her wrist]. Patient acknowledges past history of SI and attempt x 3 in distant past via overdose. Patient acknowledges history of AVH with no command via seeing lights and hearing multiple voices. Patient has history of inpt. Psychiatric care for depression and SI with multiple 2017. Patient currently is seen outpatient for psychiatric care. Patient denies history of S.A.  Patient is dressed in scrubs and is alert and oriented x4. Patient speech was within normal limits and motor behavior appeared normal. Patient thought process is coherent. Patient  does not appear to be responding to internal stimuli. Patient was cooperative throughout the assessment.   Diagnosis: Schizoaffective Disorder  Past Medical History:  Past Medical History  Diagnosis Date  . Asthma   . Anxiety   . GERD (gastroesophageal reflux disease)   . Mild cognitive impairment   . Cerebral palsy (HCC)   . Borderline personality disorder   . Depression   . Wound abscess   . Schizoaffective disorder [facility])     Past Surgical History  Procedure Laterality Date  . Cholecystectomy    . Tonsillectomy    . Incision and drainage abscess Right 01/02/2015    Procedure: INCISION AND DRAINAGE ABSCESS;  Surgeon: Tiney Rouge III, MD;  Location: [facility] ORS;  Service: General;  Laterality: Right;    Family History:   Family History  Problem Relation Age of Onset  . Diabetes Mother   . Heart attack Father     Social History:  reports that she has never smoked. She has never used smokeless tobacco. She reports that she does not drink alcohol or use illicit drugs.  Additional Social History:     CIWA: CIWA-Ar BP: 105/73 mmHg Pulse Rate: (!) 110 COWS:    PATIENT STRENGTHS: (choose at least two) Average or above average intelligence Capable of independent living Communication skills  Allergies:  Allergies  Allergen Reactions  . Penicillins Other (See Comments)    Reaction:  Unknown     Home Medications:  (Not in a hospital admission)  OB/GYN Status:  No LMP recorded. Patient has had an implant.  General Assessment Data Location of Assessment: [facility] ED TTS Assessment: In system Is this a Tele or Face-to-Face Assessment?: Face-to-Face Is this an Initial Assessment or a Re-assessment for this encounter?: Initial Assessment Marital status: Single Maiden name: n/a Is patient pregnant?: No Pregnancy Status: No Living Arrangements: Group Home Can pt return to current living arrangement?: Yes Admission Status: Voluntary Is patient capable of signing voluntary admission?: Yes Referral Source: Self/Family/Friend Insurance type: Medicare  Medical Screening Exam [facility] Walk-in ONLY) Medical Exam completed: Yes  Crisis Care Plan Living Arrangements: Group Home Legal Guardian: Other: (self) Name of Psychiatrist: Dr.Headen Name of Therapist: none  Education Status Is patient currently in school?: No Current Grade: n/a Highest grade of school patient has completed: 10th Name of school: [employer]   person: n/a  Risk to self with the past 6 months Suicidal Ideation: Yes-Currently Present Has patient been a risk to self within the past 6 months prior to admission? : Yes Suicidal Intent: Yes-Currently Present (Pt. states plan earlier was to use eraser on forearm) Has  patient had any suicidal intent within the past 6 months prior to admission? : Yes Is patient at risk for suicide?: No Suicidal Plan?: Yes-Currently Present Has patient had any suicidal plan within the past 6 months prior to admission? : Yes Specify Current Suicidal Plan: Use eraser on forearm Access to Means: No Specify Access to Suicidal Means: questionable pt states has eraser What has been your use of drugs/alcohol within the last 12 months?: denies Previous Attempts/Gestures: Yes How many times?: 3 Other Self Harm Risks: none known Triggers for Past Attempts: None known Intentional Self Injurious Behavior: None Family Suicide History: No Recent stressful life event(s): Other (Comment) Persecutory voices/beliefs?: No Depression: Yes Depression Symptoms: Despondent, Insomnia, Tearfulness Substance abuse history and/or treatment for substance abuse?: No Suicide prevention information given to non-admitted patients: Not applicable  Risk to Others within the past 6 months Homicidal Ideation: No Does patient have any lifetime risk of violence toward others beyond the six months prior to admission? : No Thoughts of Harm to Others: No Current Homicidal Intent: No Current Homicidal Plan: No Access to Homicidal Means: No Identified Victim: none identified History of harm to others?: No Assessment of Violence: None Noted Violent Behavior Description: denies Does patient have access to weapons?: No Criminal Charges Pending?: No Does patient have a court date: No Is patient on probation?: No  Psychosis Hallucinations: Visual (sees lights) Delusions: None noted  Mental Status Report Appearance/Hygiene: In scrubs Eye Contact: Good Motor Activity: Freedom of movement Speech: Logical/coherent Level of Consciousness: Alert Mood: Depressed Affect: Flat Anxiety Level: None Thought Processes: Coherent Judgement: Partial Orientation: Person, Place, Time, Situation, Appropriate for  developmental age Obsessive Compulsive Thoughts/Behaviors: None  Cognitive Functioning Concentration: Normal Memory: Recent Intact, Remote Intact IQ: Average Insight: Fair Impulse Control: Fair Appetite: Fair Weight Loss: 0 Weight Gain: 0 Sleep: Decreased Total Hours of Sleep: 4 Vegetative Symptoms: None  ADLScreening [facility] Assessment Services) Patient's cognitive ability adequate to safely complete daily activities?: Yes Patient able to express need for assistance with ADLs?: Yes Independently performs ADLs?: Yes (appropriate for developmental age)  Prior Inpatient Therapy Prior Inpatient Therapy: Yes Prior Therapy Dates: 2017 and prior Prior Therapy Facilty/Provider(s): [facility], [facility] Reason for Treatment: depression  Prior Outpatient Therapy Prior Outpatient Therapy: Yes Prior Therapy Dates: current Prior Therapy Facilty/Provider(s): [facility] Reason for Treatment: depression Does patient have an ACCT team?: No Does patient have Intensive In-House Services?  : No Does patient have [facility] services? : No Does patient have P4CC services?: No  ADL Screening (condition at time of admission) Patient's cognitive ability adequate to safely complete daily activities?: Yes Is the patient deaf or have difficulty hearing?: No Does the patient have difficulty seeing, even when wearing glasses/contacts?: No Does the patient have difficulty concentrating, remembering, or making decisions?: No Patient able to express need for assistance with ADLs?: Yes Does the patient have difficulty dressing or bathing?: No Independently performs ADLs?: Yes (appropriate for developmental age) Does the patient have difficulty walking or climbing stairs?: No Weakness of Legs: None Weakness of Arms/Hands: None  Home Assistive Devices/Equipment Home Assistive Devices/Equipment: None    Abuse/Neglect Assessment (Assessment to be complete while patient is alone) Physical Abuse:  Denies Verbal Abuse: Denies  Sexual Abuse: Denies Exploitation of patient/patient's resources: Denies Self-Neglect: Denies Values / Beliefs Cultural Requests During Hospitalization: None Spiritual Requests During Hospitalization: None   Advance Directives (For Healthcare) Does patient have an advance directive?: No Would patient like information on creating an advanced directive?: No - patient declined information    Additional Information 1:1 In Past 12 Months?: No CIRT Risk: No Elopement Risk: No Does patient have medical clearance?: Yes     Disposition: Per Louellen Molder, NP does not meet inpatient criteria.  Disposition Initial Assessment Completed for this Encounter: Yes Disposition of Patient: Outpatient treatment Type of outpatient treatment: Adult  Hipolito Bayley 11/06/2015 5:23 PM

## 2015-11-06 NOTE — Discharge Instructions (Signed)
You have been seen in the Emergency Department (ED) today for a psychiatric complaint.  You have been evaluated by psychiatry and we believe you are safe to be discharged from the hospital.    Please return to the ED immediately if you have ANY thoughts of hurting yourself or anyone else, so that we may help you.  Please avoid alcohol and drug use.  Follow up with your doctor and/or therapist as soon as possible regarding today's ED visit.   Please follow up any other recommendations and clinic appointments provided by the psychiatry team that saw you in the Emergency Department.   Schizoaffective Disorder Schizoaffective disorder (ScAD) is a mental illness. It causes symptoms that are a mixture of schizophrenia (a psychotic disorder) and an affective (mood) disorder. The schizophrenic symptoms may include delusions, hallucinations, or odd behavior. The mood symptoms may be similar to major depression or bipolar disorder. ScAD may interfere with personal relationships or normal daily activities. People with ScAD are at increased risk for job loss, social isolation,physical health problems, anxiety and substance use disorders, and suicide. ScAD usually occurs in cycles. Periods of severe symptoms are followed by periods of less severe symptoms or improvement. The illness affects men and women equally but usually appears at an earlier age (teenage or early adult years) in men. People who have family members with schizophrenia, bipolar disorder, or ScAD are at higher risk of developing ScAD. SYMPTOMS  At any one time, people with ScAD may have psychotic symptoms only or both psychotic and mood symptoms. The psychotic symptoms include one or more of the following:  Hearing, seeing, or feeling things that are not there (hallucinations).   Having fixed, false beliefs (delusions). The delusions usually are of being attacked, harassed, cheated, persecuted, or conspired against (paranoid  delusions).  Speaking in a way that makes no sense to others (disorganized speech). The psychotic symptoms of ScAD may also include confusing or odd behavior or any of the negative symptoms of schizophrenia. These include loss of motivation for normal daily activities, such as bathing or grooming, withdrawal from other people, and lack of emotions.  The mood symptoms of ScAD occur more often than not. They resemble major depressive disorder or bipolar mania. Symptoms of major depression include depressed mood and four or more of the following:  Loss of interest in usually pleasurable activities (anhedonia).  Sleeping more or less than normal.  Feeling worthless or excessively guilty.  Lack of energy or motivation.  Trouble concentrating.  Eating more or less than usual.  Thinking a lot about death or suicide. Symptoms of bipolar mania include abnormally elevated or irritable mood and increased energy or activity, plus three or more of the following:   More confidence than normal or feeling that you are able to do anything (grandiosity).  Feeling rested with less sleep than normal.   Being easily distracted.   Talking more than usual or feeling pressured to keep talking.   Feeling that your thoughts are racing.  Engaging in high-risk activities such as buying sprees or foolish business decisions. DIAGNOSIS  ScAD is diagnosed through an assessment by your health care provider. Your health care provider will observe and ask questions about your thoughts, behavior, mood, and ability to function in daily life. Your health care provider may also ask questions about your medical history and use of drugs, including prescription medicines. Your health care provider may also order blood tests and imaging exams. Certain medical conditions and substances can cause symptoms that  resemble ScAD. Your health care provider may refer you to a mental health specialist for evaluation.  ScAD is  divided into two types. The depressive type is diagnosed if your mood symptoms are limited to major depression. The bipolar type is diagnosed if your mood symptoms are manic or a mixture of manic and depressive symptoms TREATMENT  ScAD is usually a lifelong illness. Long-term treatment is necessary. The following treatments are available:  Medicine. Different types of medicine are used to treat ScAD. The exact combination depends on the type and severity of your symptoms. Antipsychotic medicine is used to control psychotic symptomssuch as delusions, paranoia, and hallucinations. Mood stabilizers can even the highs and lows of bipolar manic mood swings. Antidepressant medicines are used to treat major depressive symptoms.  Counseling or talk therapy. Individual, group, or family counseling may be helpful in providing education, support, and guidance. Many people with ScAD also benefit from social skills and job skills (vocational) training. A combination of medicine and counseling is usually best for managing the disorder over time. A procedure in which electricity is applied to the brain through the scalp (electroconvulsive therapy) may be used to treat people with severe manic symptoms that do not respond to medicine and counseling. HOME CARE INSTRUCTIONS   Take all your medicine as prescribed.  Check with your health care provider before starting new prescription or over-the-counter medicines.  Keep all follow up appointments with your health care provider. SEEK MEDICAL CARE IF:   If you are not able to take your medicines as prescribed.  If your symptoms get worse. SEEK IMMEDIATE MEDICAL CARE IF:   You have serious thoughts about hurting yourself or others.   This information is not intended to replace advice given to you by your health care provider. Make sure you discuss any questions you have with your health care provider.   Document Released: 08/14/2006 Document Revised: 04/24/2014  Document Reviewed: 11/15/2012 Elsevier Interactive Patient Education [employer]2016 [employer]

## 2015-11-06 NOTE — ED Notes (Signed)
Meal tray called for

## 2015-11-06 NOTE — ED Notes (Signed)
Patient states she was seen here yesterday for same c/o SI. States she feels more suicidal today, tried to stab her arm with pencil eraser. No wounds present.

## 2015-12-08 ENCOUNTER — Encounter (HOSPITAL_COMMUNITY): Payer: Self-pay

## 2015-12-08 ENCOUNTER — Emergency Department (HOSPITAL_COMMUNITY)
Admission: EM | Admit: 2015-12-08 | Discharge: 2015-12-09 | Disposition: A | Discharge status: Home / Self Care | Payer: Medicare Other | Attending: Emergency Medicine | Admitting: Emergency Medicine

## 2015-12-08 DIAGNOSIS — R45851 Suicidal ideations: Secondary | ICD-10-CM | POA: Diagnosis not present

## 2015-12-08 DIAGNOSIS — J449 Chronic obstructive pulmonary disease, unspecified: Secondary | ICD-10-CM | POA: Insufficient documentation

## 2015-12-08 DIAGNOSIS — Z7982 Long term (current) use of aspirin: Secondary | ICD-10-CM | POA: Diagnosis not present

## 2015-12-08 DIAGNOSIS — R51 Headache: Secondary | ICD-10-CM | POA: Insufficient documentation

## 2015-12-08 DIAGNOSIS — R079 Chest pain, unspecified: Secondary | ICD-10-CM | POA: Insufficient documentation

## 2015-12-08 DIAGNOSIS — Z79899 Other long term (current) drug therapy: Secondary | ICD-10-CM | POA: Insufficient documentation

## 2015-12-08 DIAGNOSIS — F603 Borderline personality disorder: Secondary | ICD-10-CM | POA: Diagnosis not present

## 2015-12-08 DIAGNOSIS — Z046 Encounter for general psychiatric examination, requested by authority: Secondary | ICD-10-CM | POA: Diagnosis present

## 2015-12-08 DIAGNOSIS — J45909 Unspecified asthma, uncomplicated: Secondary | ICD-10-CM | POA: Diagnosis not present

## 2015-12-08 DIAGNOSIS — F250 Schizoaffective disorder, bipolar type: Secondary | ICD-10-CM | POA: Diagnosis not present

## 2015-12-08 DIAGNOSIS — R05 Cough: Secondary | ICD-10-CM | POA: Insufficient documentation

## 2015-12-08 DIAGNOSIS — G809 Cerebral palsy, unspecified: Secondary | ICD-10-CM | POA: Diagnosis present

## 2015-12-08 DIAGNOSIS — M549 Dorsalgia, unspecified: Secondary | ICD-10-CM | POA: Insufficient documentation

## 2015-12-08 LAB — CBC WITH DIFFERENTIAL/PLATELET
BASOS ABS: 0.1 10*3/uL (ref 0.0–0.1)
Basophils Relative: 1 %
EOS ABS: 0.4 10*3/uL (ref 0.0–0.7)
EOS PCT: 5 %
HCT: 38.8 % (ref 36.0–46.0)
Hemoglobin: 13 g/dL (ref 12.0–15.0)
LYMPHS PCT: 32 %
Lymphs Abs: 2.4 10*3/uL (ref 0.7–4.0)
MCH: 32.7 pg (ref 26.0–34.0)
MCHC: 33.5 g/dL (ref 30.0–36.0)
MCV: 97.5 fL (ref 78.0–100.0)
MONO ABS: 0.6 10*3/uL (ref 0.1–1.0)
Monocytes Relative: 8 %
Neutro Abs: 4.1 10*3/uL (ref 1.7–7.7)
Neutrophils Relative %: 54 %
PLATELETS: 255 10*3/uL (ref 150–400)
RBC: 3.98 MIL/uL (ref 3.87–5.11)
RDW: 13.5 % (ref 11.5–15.5)
WBC: 7.5 10*3/uL (ref 4.0–10.5)

## 2015-12-08 LAB — BASIC METABOLIC PANEL
Anion gap: 6 (ref 5–15)
BUN: 27 mg/dL — ABNORMAL HIGH (ref 6–20)
CHLORIDE: 105 mmol/L (ref 101–111)
CO2: 28 mmol/L (ref 22–32)
Calcium: 8.7 mg/dL — ABNORMAL LOW (ref 8.9–10.3)
Creatinine, Ser: 0.94 mg/dL (ref 0.44–1.00)
GFR calc non Af Amer: >60 mL/min (ref >60)
Glucose, Bld: 105 mg/dL — ABNORMAL HIGH (ref 65–99)
POTASSIUM: 3.8 mmol/L (ref 3.5–5.1)
SODIUM: 139 mmol/L (ref 135–145)

## 2015-12-08 LAB — RAPID URINE DRUG SCREEN, HOSP PERFORMED
Amphetamines: NOT DETECTED
BENZODIAZEPINES: NOT DETECTED
Barbiturates: NOT DETECTED
Cocaine: NOT DETECTED
Opiates: NOT DETECTED
Tetrahydrocannabinol: NOT DETECTED

## 2015-12-08 NOTE — ED Notes (Signed)
[facility]

## 2015-12-08 NOTE — ED Notes (Signed)
Physician at bedside.

## 2015-12-08 NOTE — ED Triage Notes (Signed)
Pt states she feels suicidal. States she plans to run out in front of a car. States she has been like that for four days because her brother and his wife are moving out of town. States they are living with her mother and her mother is not well.

## 2015-12-08 NOTE — ED Notes (Signed)
Awaiting [facility] assessment 

## 2015-12-08 NOTE — ED Provider Notes (Signed)
[facility]-EMERGENCY DEPT Provider Note   CSN: [id number] Arrival date & time: 12/08/15  1716   By signing my name below, I, Christel MormonMatthew Jamison, attest that this documentation has been prepared under the direction and in the presence of Vanetta MuldersScott Terria Deschepper, MD . Electronically Signed: Christel MormonMatthew Jamison, Scribe. 12/08/2015. 9:23 PM.   History   Chief Complaint Chief Complaint  Patient presents with  . V70.1     The history is provided by the patient. No language interpreter was used.   HPI Comments:  [name] is a 50 y.o. female with PMHx of cerebral palsy, mild cognitive impairment, and schizoaffective disorder, [facility], who presents to the Emergency Department due to suicidal ideation with a plan to "get hit by a car" which began x 4 days. Pt lives in a group home, and states that she began feeling suicidal because her brother and his wife are moving out. Pt was seen in the ED for suicidal ideation on 11/05/15. Pt further complains of visual disturbance, cough, Cp, backpain, headache. Pt denies any other pain, fever, rhinorrea, sore throat, nausea, vomiting, abdominal pain, dysuria, rash, or bleeding,  [facility]ought in [facility]  Past Medical History:  Diagnosis Date  . Anxiety   . Asthma   . Borderline personality disorder   . Cerebral palsy (HCC)   . Depression   . GERD (gastroesophageal reflux disease)   . Mild cognitive impairment   . Schizoaffective disorder (HCC)   . Wound abscess     Patient Active Problem List   Diagnosis Date Noted  . Schizoaffective disorder, bipolar type (HCC)   . Schizoaffective disorder (HCC) 06/17/2015  . 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 GERD (gastroesophageal reflux disease) 09/24/2014  . COPD (chronic obstructive pulmonary disease) (HCC) 09/24/2014  . Borderline personality disorder 09/24/2014  . Cerebral palsy (HCC) 08/31/2014    Past Surgical History:  Procedure Laterality Date  . CHOLECYSTECTOMY      . INCISION AND DRAINAGE ABSCESS Right 01/02/2015   Procedure: INCISION AND DRAINAGE ABSCESS;  Surgeon: Tiney Rougealph Ely III, MD;  Location: [facility] ORS;  Service: General;  Laterality: Right;  . TONSILLECTOMY      OB History    Gravida Para Term Preterm AB Living   0 0 0 0 0 0   SAB TAB Ectopic Multiple Live Births   0 0 0 0         Home Medications    Prior to Admission medications   Medication Sig Start Date End Date Taking? Authorizing Provider  acetaminophen (TYLENOL) 500 MG tablet Take 500 mg by mouth every 8 (eight) hours as needed.   Yes Historical Provider, MD  amantadine (SYMMETREL) 100 MG capsule Take 1 capsule (100 mg total) by mouth 2 (two) times daily. 09/25/15 12/08/15 Yes Minna AntisKevin Paduchowski, MD  aspirin EC 81 MG tablet Take 81 mg by mouth daily.   Yes Historical Provider, MD  clonazePAM (KLONOPIN) 0.5 MG tablet Take 0.25 mg by mouth every 6 (six) hours.   Yes Historical Provider, MD  dicyclomine (BENTYL) 20 MG tablet Take 20 mg by mouth 3 (three) times daily as needed for spasms.   Yes Historical Provider, MD  diphenoxylate-atropine (LOMOTIL) 2.5-0.025 MG tablet Take 1 tablet by mouth 4 (four) times daily as needed for diarrhea or loose stools.   Yes Historical Provider, MD  divalproex (DEPAKOTE ER) 250 MG 24 hr tablet Take 500 mg by mouth 2 (two) times daily.   Yes Historical Provider, MD  escitalopram (  LEXAPRO) 20 MG tablet Take 30 mg by mouth daily.   Yes Historical Provider, MD  fenofibrate 54 MG tablet Take 54 mg by mouth daily.   Yes Historical Provider, MD  Fluticasone-Salmeterol (ADVAIR) 100-50 MCG/DOSE AEPB Inhale 1 puff into the lungs 2 (two) times daily.    Yes Historical Provider, MD  HYDROcodone-acetaminophen (NORCO/VICODIN) 5-325 MG tablet Take 1 tablet by mouth every 4 (four) hours as needed. 10/01/15  Yes Minna Antis, MD  hydrOXYzine (ATARAX/VISTARIL) 50 MG tablet Take 50 mg by mouth at bedtime.   Yes Historical Provider, MD  loperamide (IMODIUM A-D) 2 MG tablet  Take 1 tablet (2 mg total) by mouth 4 (four) times daily as needed for diarrhea or loose stools. 10/01/15  Yes Minna Antis, MD  meloxicam (MOBIC) 15 MG tablet Take 15 mg by mouth daily as needed.    Yes Historical Provider, MD  miconazole (MICOTIN) 2 % powder Apply 1 application topically 2 (two) times daily as needed for itching.   Yes Historical Provider, MD  ondansetron (ZOFRAN ODT) 4 MG disintegrating tablet Take 1 tablet (4 mg total) by mouth every 8 (eight) hours as needed for nausea or vomiting. 11/02/15  Yes Sharman Cheek, MD  pantoprazole (PROTONIX) 40 MG tablet Take 40 mg by mouth 2 (two) times daily.   Yes Historical Provider, MD  QUEtiapine (SEROQUEL) 50 MG tablet Take 25 mg by mouth 4 (four) times daily as needed.   Yes Historical Provider, MD  risperiDONE (RISPERDAL) 3 MG tablet Take 1 tablet (3 mg total) by mouth at bedtime. 09/25/15  Yes Minna Antis, MD  traMADol (ULTRAM) 50 MG tablet Take by mouth every 6 (six) hours as needed.   Yes Historical Provider, MD    Family History Family History  Problem Relation Age of Onset  . Heart attack Father   . Diabetes Mother     Social History Social History  Substance Use Topics  . Smoking status: Never Smoker  . Smokeless tobacco: Never Used  . Alcohol use No     Allergies   Penicillins   Review of Systems Review of Systems  Constitutional: Negative for fever.  HENT: Negative for rhinorrhea and sore throat.   Eyes: Positive for visual disturbance.  Respiratory: Positive for cough.   Cardiovascular: Positive for chest pain.  Gastrointestinal: Negative for abdominal pain, nausea and vomiting.  Genitourinary: Negative for dysuria.  Musculoskeletal: Positive for back pain.  Skin: Negative for rash.  Neurological: Positive for headaches.  Hematological: Does not bruise/bleed easily.  Psychiatric/Behavioral: Positive for suicidal ideas.     Physical Exam Updated Vital Signs BP 105/70 (BP Location: Left  Arm)   Pulse 79   Temp 98.6 F (37 C) (Oral)   Resp 18   Ht 5\' 4"  (1.626 m)   Wt 81.6 kg   SpO2 96%   BMI 30.90 kg/m   Physical Exam  Constitutional: She is oriented to person, place, and time. She appears well-developed and well-nourished.  HENT:  Head: Normocephalic and atraumatic.  Mouth/Throat: Oropharynx is clear and moist.     Eyes: Conjunctivae and EOM are normal. Pupils are equal, round, and reactive to light.  Sclera clear  Neck: Neck supple.  Cardiovascular: Normal rate and regular rhythm.   No murmur heard. Pulmonary/Chest: Effort normal and breath sounds normal. No respiratory distress.  Lungs clear bilaterally   Abdominal: Soft. There is no tenderness.  Musculoskeletal: She exhibits edema.  Trace edema to bilateral legs Normal ROM in fingers and toes  Neurological: She is alert and oriented to person, place, and time. No cranial nerve deficit. She exhibits normal muscle tone. Coordination normal.  Skin: Skin is warm and dry.  Psychiatric: She has a normal mood and affect. Her behavior is normal.  Nursing note and vitals reviewed.    ED Treatments / Results  DIAGNOSTIC STUDIES:  Oxygen Saturation is 96% on RA, adequate by my interpretation.    COORDINATION OF CARE:  9:23 PM Will refer to psych. Discussed treatment plan with pt at bedside and pt agreed to plan.  Labs (all labs ordered are listed, but only abnormal results are displayed) Labs Reviewed  BASIC METABOLIC PANEL - Abnormal; Notable for the following:       Result Value   Glucose, Bld 105 (*)    BUN 27 (*)    Calcium 8.7 (*)    All other components within normal limits  CBC WITH DIFFERENTIAL/PLATELET  URINE RAPID DRUG SCREEN, HOSP PERFORMED   Results for orders placed or performed during the hospital encounter of 12/08/15  CBC with Differential/Platelet  Result Value Ref Range   WBC 7.5 4.0 - 10.5 K/uL   RBC 3.98 3.87 - 5.11 MIL/uL   Hemoglobin 13.0 12.0 - 15.0 g/dL   HCT 40.938.8 81.136.0  - 91.446.0 %   MCV 97.5 78.0 - 100.0 fL   MCH 32.7 26.0 - 34.0 pg   MCHC 33.5 30.0 - 36.0 g/dL   RDW 78.213.5 95.611.5 - 21.315.5 %   Platelets 255 150 - 400 K/uL   Neutrophils Relative % 54 %   Neutro Abs 4.1 1.7 - 7.7 K/uL   Lymphocytes Relative 32 %   Lymphs Abs 2.4 0.7 - 4.0 K/uL   Monocytes Relative 8 %   Monocytes Absolute 0.6 0.1 - 1.0 K/uL   Eosinophils Relative 5 %   Eosinophils Absolute 0.4 0.0 - 0.7 K/uL   Basophils Relative 1 %   Basophils Absolute 0.1 0.0 - 0.1 K/uL  Urine rapid drug screen (hosp performed)  Result Value Ref Range   Opiates NONE DETECTED NONE DETECTED   Cocaine NONE DETECTED NONE DETECTED   Benzodiazepines NONE DETECTED NONE DETECTED   Amphetamines NONE DETECTED NONE DETECTED   Tetrahydrocannabinol NONE DETECTED NONE DETECTED   Barbiturates NONE DETECTED NONE DETECTED  Basic metabolic panel  Result Value Ref Range   Sodium 139 135 - 145 mmol/L   Potassium 3.8 3.5 - 5.1 mmol/L   Chloride 105 101 - 111 mmol/L   CO2 28 22 - 32 mmol/L   Glucose, Bld 105 (H) 65 - 99 mg/dL   BUN 27 (H) 6 - 20 mg/dL   Creatinine, Ser 0.860.94 0.44 - 1.00 mg/dL   Calcium 8.7 (L) 8.9 - 10.3 mg/dL   GFR calc non Af Amer >60 >60 mL/min   GFR calc Af Amer >60 >60 mL/min   Anion gap 6 5 - 15     EKG  EKG Interpretation None       Radiology No results found.  Procedures Procedures (including critical care time)  Medications Ordered in ED Medications - No data to display   Initial Impression / Assessment and Plan / ED Course  I have reviewed the triage vital signs and the nursing notes.  Pertinent labs & imaging results that were available during my care of the patient were reviewed by me and considered in my medical decision making (see chart for details).  Clinical Course   Patient clearly stating that she is suicidal with a  plan to walk around in front of cars. Patient also may have some element of depression due to her brother leaving town. Patient is willing to stay.  Patient has a history of mental retardation but is quite functional and very cooperative. Patient medically cleared for evaluation by behavioral health.   Final Clinical Impressions(s) / ED Diagnoses   Final diagnoses:  Suicidal ideations    New Prescriptions New Prescriptions   No medications on file   I personally performed the services described in this documentation, which was scribed in my presence. The recorded information has been reviewed and is accurate.       Vanetta Mulders, MD 12/08/15 [phone]

## 2015-12-08 NOTE — ED Notes (Signed)
Pt [facility] at [facility], However, she saw her therapist at [facility] yesterday Ms Darlin DropFeaster in [place], [place], She has multiple admissions at [facility] and sees Dr Omelia BlackwaterHeaden there. A Ms Vear Clockhillips, who identified as an RN called and stated that pt has "behavioral issues" where she obsesses about walking in traffic, health issues et. She states that pt has exhausted Shirley in their care for her. States pt always threatens to walkinto traffic, but when She is "told her to do it" she does not make an effort to walk into traffic.   Pt reports being "terrified" and that she has a plan to walk into trafficl. She report repeatedly that she is "suicidal" and appears slow to comprehend.

## 2015-12-08 NOTE — ED Notes (Signed)
Meal provided 

## 2015-12-09 ENCOUNTER — Encounter (HOSPITAL_COMMUNITY): Payer: Self-pay | Admitting: Emergency Medicine

## 2015-12-09 DIAGNOSIS — F250 Schizoaffective disorder, bipolar type: Secondary | ICD-10-CM

## 2015-12-09 DIAGNOSIS — R45851 Suicidal ideations: Secondary | ICD-10-CM

## 2015-12-09 DIAGNOSIS — F603 Borderline personality disorder: Secondary | ICD-10-CM

## 2015-12-09 DIAGNOSIS — G809 Cerebral palsy, unspecified: Secondary | ICD-10-CM | POA: Diagnosis not present

## 2015-12-09 MED ORDER — ONDANSETRON 4 MG PO TBDP: 4.0000 mg | ORAL_TABLET | Freq: Three times a day (TID) | ORAL | Status: DC | PRN

## 2015-12-09 MED ORDER — RISPERIDONE 1 MG PO TABS
3.0000 mg | ORAL_TABLET | Freq: Every day | ORAL | Status: DC
  Filled 2015-12-09: qty 1

## 2015-12-09 MED ORDER — DICYCLOMINE HCL 10 MG PO CAPS: 20.0000 mg | ORAL_CAPSULE | Freq: Three times a day (TID) | ORAL | Status: DC | PRN

## 2015-12-09 MED ORDER — ESCITALOPRAM OXALATE 20 MG PO TABS
30.0000 mg | ORAL_TABLET | Freq: Every day | ORAL | Status: DC
  Filled 2015-12-09 (×2): qty 1

## 2015-12-09 MED ORDER — TRAMADOL HCL 50 MG PO TABS: 50.0000 mg | ORAL_TABLET | Freq: Four times a day (QID) | ORAL | Status: DC | PRN

## 2015-12-09 MED ORDER — DICYCLOMINE HCL 20 MG PO TABS: 20.0000 mg | ORAL_TABLET | Freq: Three times a day (TID) | ORAL | Status: DC | PRN

## 2015-12-09 MED ORDER — ESCITALOPRAM OXALATE 10 MG PO TABS
30.0000 mg | ORAL_TABLET | Freq: Every day | ORAL | Status: DC
  Administered 2015-12-09: 30 mg via ORAL
  Filled 2015-12-09 (×2): qty 3

## 2015-12-09 MED ORDER — CLONAZEPAM 0.5 MG PO TABS
0.2500 mg | ORAL_TABLET | Freq: Four times a day (QID) | ORAL | Status: DC
  Administered 2015-12-09: 0.25 mg via ORAL
  Filled 2015-12-09: qty 1

## 2015-12-09 MED ORDER — NYSTATIN 100000 UNIT/GM EX POWD
Freq: Two times a day (BID) | CUTANEOUS | Status: DC | PRN
  Filled 2015-12-09: qty 15

## 2015-12-09 MED ORDER — DIVALPROEX SODIUM ER 500 MG PO TB24
500.0000 mg | ORAL_TABLET | Freq: Two times a day (BID) | ORAL | Status: DC
  Administered 2015-12-09: 500 mg via ORAL
  Filled 2015-12-09: qty 1

## 2015-12-09 MED ORDER — FENOFIBRATE 54 MG PO TABS
54.0000 mg | ORAL_TABLET | Freq: Every day | ORAL | Status: DC
  Administered 2015-12-09: 54 mg via ORAL
  Filled 2015-12-09 (×2): qty 1

## 2015-12-09 MED ORDER — MOMETASONE FURO-FORMOTEROL FUM 100-5 MCG/ACT IN AERO
2.0000 | INHALATION_SPRAY | Freq: Two times a day (BID) | RESPIRATORY_TRACT | Status: DC
  Administered 2015-12-09: 2 via RESPIRATORY_TRACT
  Filled 2015-12-09: qty 8.8

## 2015-12-09 MED ORDER — PANTOPRAZOLE SODIUM 40 MG PO TBEC
40.0000 mg | DELAYED_RELEASE_TABLET | Freq: Two times a day (BID) | ORAL | Status: DC
  Administered 2015-12-09: 40 mg via ORAL
  Filled 2015-12-09: qty 1

## 2015-12-09 MED ORDER — MICONAZOLE NITRATE 2 % EX POWD: 1.0000 "application " | Freq: Two times a day (BID) | CUTANEOUS | Status: DC | PRN

## 2015-12-09 MED ORDER — ASPIRIN EC 81 MG PO TBEC
81.0000 mg | DELAYED_RELEASE_TABLET | Freq: Every day | ORAL | Status: DC
  Administered 2015-12-09: 81 mg via ORAL
  Filled 2015-12-09: qty 1

## 2015-12-09 MED ORDER — MELOXICAM 7.5 MG PO TABS
15.0000 mg | ORAL_TABLET | Freq: Every day | ORAL | Status: DC | PRN
  Filled 2015-12-09: qty 1

## 2015-12-09 MED ORDER — QUETIAPINE FUMARATE 25 MG PO TABS
25.0000 mg | ORAL_TABLET | Freq: Four times a day (QID) | ORAL | Status: DC | PRN
  Filled 2015-12-09: qty 1

## 2015-12-09 NOTE — BH Assessment (Addendum)
Tele Assessment Note   [name] is an 50 y.o. female presenting voluntarily for assessment and requesting inpatient admission. Pt states "I've been suicidal for the last four days". Pt reports plan of getting hit by a car. Pt identified her brother moving out of state as trigger for suicidal ideation. Pt reports history of multiple suicide attempts and inpatient admissions. Pt resides in a group home [facility] [phone]).   Pt has history of Schizoaffective disorder, bipolar type, mild intellectual disability and cerebral palsy. Pt is well known to [facility] ED and often presents with baseline suicidal ideation but, without plan or intent. Pt reports visual hallucinations of "things running around in circles" and auditory hallucinations with command to get hit by a car.   Pt denies homicidal ideation and thoughts of harm towards others. Pt has no history of substance use.   Diagnosis: F25.0 Schizoaffective disorder, bipolar type  Past Medical History:  Past Medical History:  Diagnosis Date  . Anxiety   . Asthma   . Borderline personality disorder   . Cerebral palsy (HCC)   . Depression   . GERD (gastroesophageal reflux disease)   . Mild cognitive impairment   . Schizoaffective disorder (HCC)   . Wound abscess     Past Surgical History:  Procedure Laterality Date  . CHOLECYSTECTOMY    . INCISION AND DRAINAGE ABSCESS Right 01/02/2015   Procedure: INCISION AND DRAINAGE ABSCESS;  Surgeon: Tiney Rouge III, MD;  Location: [facility] ORS;  Service: General;  Laterality: Right;  . TONSILLECTOMY      Family History:  Family History  Problem Relation Age of Onset  . Heart attack Father   . Diabetes Mother     Social History:  reports that she has never smoked. She has never used smokeless tobacco. She reports that she does not drink alcohol or use drugs.  Additional Social History:  Alcohol / Drug Use Pain Medications: No abuse reportedd Prescriptions: No abuse reported. Pt reports  compliance with medication. Over the Counter: No abuse reported History of alcohol / drug use?: No history of alcohol / drug abuse  CIWA: CIWA-Ar BP: 105/70 Pulse Rate: 79 COWS:    PATIENT STRENGTHS: (choose at least two) Communication skills General fund of knowledge  Allergies:  Allergies  Allergen Reactions  . Penicillins Other (See Comments)    Reaction:  Unknown  Has patient had a PCN reaction causing immediate rash, facial/tongue/throat swelling, SOB or lightheadedness with hypotension: Nounknown Has patient had a PCN reaction causing severe rash involving mucus membranes or skin necrosis: Nounknown Has patient had a PCN reaction that required hospitalization Nounknown Has patient had a PCN reaction occurring within the last 10 years: Nounknown If all of the above answers are "NO", then may proceed with Cephalosporin use.     Home Medications:  (Not in a hospital admission)  OB/GYN Status:  No LMP recorded. Patient has had an implant.  General Assessment Data Location of Assessment: [facility] TTS Assessment: In system Is this a Tele or Face-to-Face Assessment?: Tele Assessment Is this an Initial Assessment or a Re-assessment for this encounter?: Initial Assessment Marital status: Single Is patient pregnant?: Unknown Pregnancy Status: Unknown Living Arrangements: Group Home [facility] [phone]) Can pt return to current living arrangement?: Yes Admission Status: Voluntary Is patient capable of signing voluntary admission?: Yes Referral Source: Self/Family/Friend Insurance type: Medicare     Crisis Care Plan Living Arrangements: Group Home [facility] [phone]) Name of Psychiatrist: Dr.Headen Name of Therapist: None Reported  Education Status Is patient currently in school?: No Highest grade of school patient has completed: 10th Contact person: n/a  Risk to self with the past 6 months Suicidal Ideation: Yes-Currently Present Has patient been a  risk to self within the past 6 months prior to admission? : Yes Suicidal Intent: Yes-Currently Present Has patient had any suicidal intent within the past 6 months prior to admission? : Yes Is patient at risk for suicide?: Yes Suicidal Plan?: Yes-Currently Present Has patient had any suicidal plan within the past 6 months prior to admission? : Yes Specify Current Suicidal Plan: get hit by car Access to Means: Yes Specify Access to Suicidal Means: access to traffic What has been your use of drugs/alcohol within the last 12 months?: Pt reports no drug/alcohol use Previous Attempts/Gestures: Yes How many times?: 3 (per chart) Other Self Harm Risks: None Known Triggers for Past Attempts: Unpredictable Intentional Self Injurious Behavior: None Family Suicide History: No Recent stressful life event(s): Other (Comment) (brother moving out of state) Persecutory voices/beliefs?: No Depression: Yes Depression Symptoms:  (sadness) Substance abuse history and/or treatment for substance abuse?: No Suicide prevention information given to non-admitted patients: Yes  Risk to Others within the past 6 months Homicidal Ideation: No Does patient have any lifetime risk of violence toward others beyond the six months prior to admission? : No Thoughts of Harm to Others: No Current Homicidal Intent: No Current Homicidal Plan: No Access to Homicidal Means: No History of harm to others?: No Assessment of Violence: None Noted Does patient have access to weapons?: No Criminal Charges Pending?: No Does patient have a court date: No Is patient on probation?: No  Psychosis Hallucinations: Auditory, Visual, With command (with command to hurt self) Delusions: None noted  Mental Status Report Appearance/Hygiene: In scrubs Eye Contact: Good Motor Activity: Restlessness Speech: Logical/coherent Level of Consciousness: Alert Mood: Sad Affect: Sad Anxiety Level: None Thought Processes: Coherent,  Relevant Judgement: Partial Orientation: Person, Place, Time, Situation Obsessive Compulsive Thoughts/Behaviors: None  Cognitive Functioning Concentration: Normal Memory: Recent Intact, Remote Intact IQ: Average Insight: Fair Impulse Control: Fair Appetite: Fair Weight Loss: 0 Weight Gain:  (gained unknown amount) Sleep: Decreased Total Hours of Sleep: 0 Vegetative Symptoms: None  ADLScreening [facility]) Patient's cognitive ability adequate to safely complete daily activities?: Yes Patient able to express need for assistance with ADLs?: Yes Independently performs ADLs?: Yes (appropriate for developmental age)  Prior Inpatient Therapy Prior Inpatient Therapy: Yes Prior Therapy Dates: 2017 and prior Prior Therapy [facility]ilty[facility]Provider([facility], [facility] Reason for Treatment: depression  Prior Outpatient Therapy Prior Outpatient Therapy: Yes Prior Therapy Dates: current [facility] Reason for Treatment: depression Does patient have an ACCT team?: No Does patient have Intensive In-House Services?  : No Does patient have [facility] services? : No Does patient have P4CC services?: No  ADL Screening (condition at time of admission) Patient's cognitive ability adequate to safely complete daily activities?: Yes Patient able to express need for assistance with ADLs?: Yes Independently performs ADLs?: Yes (appropriate for developmental age)       Abuse/Neglect Assessment (Assessment to be complete while patient is alone) Physical Abuse: Denies Verbal Abuse: Denies Sexual Abuse: Denies Exploitation of patient/patient's resources: Denies Self-Neglect: Denies Values / Beliefs Cultural Requests During Hospitalization: None Spiritual Requests During Hospitalization: None Consults Spiritual Care Consult Needed: No Social Work Consult Needed: No Merchant navy officerAdvance Directives (For Healthcare) Does patient have an advance directive?:  No Would patient like information on creating an advanced directive?:  No - patient declined information    Additional Information 1:1 In Past 12 Months?: No CIRT Risk: No Elopement Risk: No Does patient have medical clearance?: Yes     Disposition: Clinician consulted with Donell Sievert, PA and pt is recommended for inpatient admission. TTS to seek placement. John, Licensed conveyancer has been informed and will communicate pt disposition to pt RN.  Disposition Initial Assessment Completed for this Encounter: Yes Disposition of Patient: Other dispositions Other disposition(s): Other (Comment) (Pending Psychiatric Recommendation)  Efton Thomley J [place] 12/09/2015 5:28 AM

## 2015-12-09 NOTE — ED Notes (Signed)
Pt moved to rm 16, until disposition. VS taken, comfort measures- when asked regarding pain, pt states her pain is 10 and that it comes and goes and she believes she has appendix because it runs in her family.  She ambulates erect, has relaxed facial features and has not complained of pain in the previous interactions with staff.  She has enjoyed snacks and beverages without any type of complaint.

## 2015-12-09 NOTE — Discharge Instructions (Signed)
Follow-up with your counselors and physicians as directed by behavioral health staff

## 2015-12-09 NOTE — ED Notes (Signed)
Called to room by pt  States she will walk in front of car if she is sent home. I asked pt is she saying this because she wants to be in patient.  She said yes , then said no Im not.  [facility]. States this is her baseline and she can still go back to home

## 2015-12-09 NOTE — ED Notes (Signed)
Awaiting [facility] assessment 

## 2015-12-09 NOTE — ED Notes (Signed)
Pt notified that she would be returning home and [facility]

## 2015-12-09 NOTE — ED Notes (Signed)
[facility] Pt meets inpt criteria, seeking placement

## 2015-12-09 NOTE — ED Notes (Signed)
Meal given

## 2015-12-09 NOTE — Progress Notes (Signed)
Telepsychiatry has evaluated pt and resulting recommendation is that pt is baseline psychiatrically and can be d/c to follow up with her established providers.  Called [name] [phone], supervisor at [facility]'s group [facility]. Advised of above. Also provided number for [facility] in case further facilitation of d/c is needed. Advised [facility].  [name], MSW, LCSW Clinical Social Work, Disposition  12/09/2015 [phone]

## 2015-12-09 NOTE — ED Notes (Signed)
telepsych in progress 

## 2015-12-09 NOTE — Consult Note (Signed)
[place]   Reason for Consult:  Reports that she wants to walk into traffic to commit suicide Referring Physician:  EDP [name] MRN:  [id number] Principal Diagnosis: Schizoaffective disorder Diagnosis:   Patient Active Problem List   Diagnosis Date Noted  . Suicidal ideation [R45.851] 12/09/2015    Priority: High  . Schizoaffective disorder, bipolar type ([place]) [F25.0]     Priority: High  . Borderline personality disorder [F60.3] 09/24/2014    Priority: High  . Cerebral palsy ([place]) [G80.9] 08/31/2014    Priority: High  . Schizoaffective disorder ([place]) [F25.9] 06/17/2015  . 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 GERD (gastroesophageal reflux disease) [K21.9] 09/24/2014  . COPD (chronic obstructive pulmonary disease) ([place]) [J44.9] 09/24/2014    Total Time spent with patient: 30 minutes  Subjective:   [name] is a 50 y.o. female patient admitted with reports of walking into traffic in attempts to commit suicide numerous times. Pt seen and chart reviewed. Pt is alert/oriented x4, very anxious yet cooperative, and appropriate to situation. Pt denies homicidal ideation and psychosis and does not appear to be responding to internal stimuli. Pt states she is "extremely overwhelmed" due to her brother and his wife "moving out [place] state." Pt has a chart history of intellectual disability (unknown/undocumented IQ) and cerebral palsy for which her presentation today is consistent with such intellectual delays. Pt's speech is mildly slurred with mild delays in response times and difficulty forming coherent sentences. Pt has limited vocabulary.   HPI:  I have reviewed and concur with HPI elements below, modified as follows:  [name] is an 50 y.o. female presenting voluntarily for assessment and requesting inpatient admission. Pt states "I've been suicidal for the last four days". Pt reports plan of getting  hit by a car. Pt identified her brother moving out of state as trigger for suicidal ideation. Pt reports history of multiple suicide attempts and inpatient admissions. Pt resides in a group home [facility] [phone]).   Pt has history of Schizoaffective disorder, bipolar type, mild intellectual disability and cerebral palsy. Pt is well known to [facility] ED and often presents with baseline suicidal ideation but, without plan or intent. Pt reports visual hallucinations of "things running around in circles" and auditory hallucinations with command to get hit by a car.   Past Psychiatric History: Review of chart indicates multiple prior visits to the emergency room under very similar circumstances. By history, per our colleague Dr. Weber Cooks, when she gets frustrated or unhappy at her group home she will ask to be brought to the emergency room. She will often state that she is suicidal but usually calm down pretty quickly. She typically returns to her group home and she does not act out in a dangerous way. Patient has appropriate outpatient psychiatric treatment in place and intact support from the group home and her family.  Collateral from group home affirms this and they feel comfortable taking the pt home.   Risk to Self: Suicidal Ideation: Yes-Currently Present Suicidal Intent: Yes-Currently Present Is patient at risk for suicide?: Yes Suicidal Plan?: Yes-Currently Present Specify Current Suicidal Plan: get hit by car Access to Means: Yes Specify Access to Suicidal Means: access to traffic What has been your use of drugs/alcohol within the last 12 months?: Pt reports no drug/alcohol use How many times?: 3 (per chart) Other Self Harm Risks: None Known Triggers for Past Attempts: Unpredictable Intentional Self Injurious Behavior: None Risk to Others:  Homicidal Ideation: No Thoughts of Harm to Others: No Current Homicidal Intent: No Current Homicidal Plan: No Access to Homicidal Means:  No History of harm to others?: No Assessment of Violence: None Noted Does patient have access to weapons?: No Criminal Charges Pending?: No Does patient have a court date: No Prior Inpatient Therapy: Prior Inpatient Therapy: Yes Prior Therapy Dates: 2017 and prior Prior Therapy Facilty/Provider(s): [facility] Reason for Treatment: depression Prior Outpatient Therapy: Prior Outpatient Therapy: Yes Prior Therapy Dates: current Prior Therapy Facilty/Provider(s): [facility] Reason for Treatment: depression Does patient have an ACCT team?: No Does patient have Intensive In-House Services?  : No Does patient have [facility] services? : No Does patient have P4CC services?: No  Past Medical History:  Past Medical History:  Diagnosis Date  . Anxiety   . Asthma   . Borderline personality disorder   . Cerebral palsy ([place])   . Depression   . GERD (gastroesophageal reflux disease)   . Mild cognitive impairment   . Schizoaffective disorder ([place])   . Wound abscess     Past Surgical History:  Procedure Laterality Date  . CHOLECYSTECTOMY    . INCISION AND DRAINAGE ABSCESS Right 01/02/2015   Procedure: INCISION AND DRAINAGE ABSCESS;  Surgeon: Dia Crawford III, MD;  Location: [facility] ORS;  Service: General;  Laterality: Right;  . TONSILLECTOMY     Family History:  Family History  Problem Relation Age of Onset  . Heart attack Father   . Diabetes Mother    Family Psychiatric  History: Denies family history of mental illness Social History:  History  Alcohol Use No     History  Drug Use No    Social History   Social History  . Marital status: Single    Spouse name: N/A  . Number of children: N/A  . Years of education: N/A   Social History Main Topics  . Smoking status: Never Smoker  . Smokeless tobacco: Never Used  . Alcohol use No  . Drug use: No  . Sexual activity: Yes    Birth control/ protection: Implant   Other Topics Concern  . None   Social History  Narrative  . None   Additional Social History:    Allergies:   Allergies  Allergen Reactions  . Penicillins Other (See Comments)    Reaction:  Unknown  Has patient had a PCN reaction causing immediate rash, facial/tongue/throat swelling, SOB or lightheadedness with hypotension: Nounknown Has patient had a PCN reaction causing severe rash involving mucus membranes or skin necrosis: Nounknown Has patient had a PCN reaction that required hospitalization Nounknown Has patient had a PCN reaction occurring within the last 10 years: Nounknown If all of the above answers are "NO", then may proceed with Cephalosporin use.     Labs:  Results for orders placed or performed during the hospital encounter of 12/08/15 (from the past 48 hour(s))  CBC with Differential/Platelet     Status: None   Collection Time: 12/08/15  9:42 PM  Result Value Ref Range   WBC 7.5 4.0 - 10.5 K/uL   RBC 3.98 3.87 - 5.11 MIL/uL   Hemoglobin 13.0 12.0 - 15.0 g/dL   HCT 38.8 36.0 - 46.0 %   MCV 97.5 78.0 - 100.0 fL   MCH 32.7 26.0 - 34.0 pg   MCHC 33.5 30.0 - 36.0 g/dL   RDW 13.5 11.5 - 15.5 %   Platelets 255 150 - 400 K/uL   Neutrophils Relative % 54 %  Neutro Abs 4.1 1.7 - 7.7 K/uL   Lymphocytes Relative 32 %   Lymphs Abs 2.4 0.7 - 4.0 K/uL   Monocytes Relative 8 %   Monocytes Absolute 0.6 0.1 - 1.0 K/uL   Eosinophils Relative 5 %   Eosinophils Absolute 0.4 0.0 - 0.7 K/uL   Basophils Relative 1 %   Basophils Absolute 0.1 0.0 - 0.1 K/uL  Basic metabolic panel     Status: Abnormal   Collection Time: 12/08/15  9:42 PM  Result Value Ref Range   Sodium 139 135 - 145 mmol/L   Potassium 3.8 3.5 - 5.1 mmol/L   Chloride 105 101 - 111 mmol/L   CO2 28 22 - 32 mmol/L   Glucose, Bld 105 (H) 65 - 99 mg/dL   BUN 27 (H) 6 - 20 mg/dL   Creatinine, Ser 0.94 0.44 - 1.00 mg/dL   Calcium 8.7 (L) 8.9 - 10.3 mg/dL   GFR calc non Af Amer >60 >60 mL/min   GFR calc Af Amer >60 >60 mL/min    Comment: (NOTE) The eGFR has  been calculated using the CKD EPI equation. This calculation has not been validated in all clinical situations. eGFR's persistently <60 mL/min signify possible Chronic Kidney Disease.    Anion gap 6 5 - 15  Urine rapid drug screen (hosp performed)     Status: None   Collection Time: 12/08/15  9:52 PM  Result Value Ref Range   Opiates NONE DETECTED NONE DETECTED   Cocaine NONE DETECTED NONE DETECTED   Benzodiazepines NONE DETECTED NONE DETECTED   Amphetamines NONE DETECTED NONE DETECTED   Tetrahydrocannabinol NONE DETECTED NONE DETECTED   Barbiturates NONE DETECTED NONE DETECTED    Comment:        DRUG SCREEN FOR MEDICAL PURPOSES ONLY.  IF CONFIRMATION IS NEEDED FOR ANY PURPOSE, NOTIFY LAB WITHIN 5 DAYS.        LOWEST DETECTABLE LIMITS FOR URINE DRUG SCREEN Drug Class       Cutoff (ng/mL) Amphetamine      1000 Barbiturate      200 Benzodiazepine   564 Tricyclics       332 Opiates          300 Cocaine          300 THC              50     Current Facility-Administered Medications  Medication Dose Route Frequency Provider Last Rate Last Dose  . aspirin EC tablet 81 mg  81 mg Oral Daily Kristen N Ward, DO   81 mg at 12/09/15 9518  . clonazePAM (KLONOPIN) tablet 0.25 mg  0.25 mg Oral Q6H Kristen N Ward, DO   0.25 mg at 12/09/15 8416  . dicyclomine (BENTYL) capsule 20 mg  20 mg Oral TID PRN Dorie Rank, MD      . divalproex (DEPAKOTE ER) 24 hr tablet 500 mg  500 mg Oral BID Kristen N Ward, DO   500 mg at 12/09/15 6063  . escitalopram (LEXAPRO) tablet 30 mg  30 mg Oral Daily Dorie Rank, MD   30 mg at 12/09/15 0160  . fenofibrate tablet 54 mg  54 mg Oral Daily Kristen N Ward, DO   54 mg at 12/09/15 1093  . meloxicam (MOBIC) tablet 15 mg  15 mg Oral Daily PRN Dorie Rank, MD      . mometasone-formoterol (DULERA) 100-5 MCG/ACT inhaler 2 puff  2 puff Inhalation BID Kristen N Ward, DO      .  nystatin (MYCOSTATIN/NYSTOP) topical powder   Topical BID PRN Dorie Rank, MD      . ondansetron  (ZOFRAN-ODT) disintegrating tablet 4 mg  4 mg Oral Q8H PRN Kristen N Ward, DO      . pantoprazole (PROTONIX) EC tablet 40 mg  40 mg Oral BID Kristen N Ward, DO   40 mg at 12/09/15 4098  . QUEtiapine (SEROQUEL) tablet 25 mg  25 mg Oral QID PRN Kristen N Ward, DO      . risperiDONE (RISPERDAL) tablet 3 mg  3 mg Oral QHS Dorie Rank, MD      . traMADol Veatrice Bourbon) tablet 50 mg  50 mg Oral Q6H PRN Delice Bison Ward, DO       Current Outpatient Prescriptions  Medication Sig Dispense Refill  . amantadine (SYMMETREL) 100 MG capsule Take 1 capsule (100 mg total) by mouth 2 (two) times daily. 60 capsule 0  . aspirin EC 81 MG tablet Take 81 mg by mouth daily.    . clonazePAM (KLONOPIN) 0.5 MG tablet Take 0.25 mg by mouth every 6 (six) hours.    Marland Kitchen dicyclomine (BENTYL) 20 MG tablet Take 20 mg by mouth 3 (three) times daily as needed for spasms.    . divalproex (DEPAKOTE ER) 250 MG 24 hr tablet Take 500 mg by mouth 2 (two) times daily.    Marland Kitchen escitalopram (LEXAPRO) 20 MG tablet Take 30 mg by mouth daily.    . fenofibrate 54 MG tablet Take 54 mg by mouth daily.    . Fluticasone-Salmeterol (ADVAIR) 100-50 MCG/DOSE AEPB Inhale 1 puff into the lungs 2 (two) times daily.     . meloxicam (MOBIC) 15 MG tablet Take 15 mg by mouth daily as needed.     . miconazole (MICOTIN) 2 % powder Apply 1 application topically 2 (two) times daily as needed for itching.    . ondansetron (ZOFRAN ODT) 4 MG disintegrating tablet Take 1 tablet (4 mg total) by mouth every 8 (eight) hours as needed for nausea or vomiting. 20 tablet 0  . pantoprazole (PROTONIX) 40 MG tablet Take 40 mg by mouth 2 (two) times daily.    . QUEtiapine (SEROQUEL) 50 MG tablet Take 25 mg by mouth 4 (four) times daily as needed.    . risperiDONE (RISPERDAL) 3 MG tablet Take 1 tablet (3 mg total) by mouth at bedtime. 30 tablet 0  . traMADol (ULTRAM) 50 MG tablet Take by mouth every 6 (six) hours as needed.      Musculoskeletal: UTO, camera   Psychiatric Specialty  Exam: Physical Exam  Constitutional: She appears well-developed and well-nourished.  Neurological: She is alert.  Psychiatric: Her speech is normal and behavior is normal. Thought content normal. Her mood appears anxious. Cognition and memory are impaired. She expresses impulsivity.    Review of Systems  Constitutional: Negative.   HENT: Negative.   Eyes: Negative.   Respiratory: Negative.   Cardiovascular: Negative.   Gastrointestinal: Negative.   Musculoskeletal: Negative.   Skin: Negative.   Neurological: Negative.   Psychiatric/Behavioral: Positive for depression and suicidal ideas. Negative for hallucinations, memory loss and substance abuse. The patient is nervous/anxious. The patient does not have insomnia.     Blood pressure 103/65, pulse 70, temperature 98.7 F (37.1 C), temperature source Oral, resp. rate 18, height '5\' 4"'  (1.626 m), weight 81.6 kg (180 lb), SpO2 97 %.Body mass index is 30.9 kg/m.  General Appearance: Fairly Groomed and casual  Eye Contact:  Good  Speech:  Slow  Volume:  Decreased  Mood:  Anxious  Affect:  Blunt  Thought Process:  Goal Directed  Orientation:  Full (Time, Place, and Person)  Thought Content:  Logical , linear, goal-directed  Suicidal Thoughts:  Yes, chronic, baseline  Homicidal Thoughts:  No  Memory:  Immediate;   Good Recent;   Fair Remote;   Fair  Judgement:  Impaired  Insight:  Shallow  Psychomotor Activity:  Decreased  Concentration:  Concentration: Poor  Recall:  [employer] of Knowledge:  Fair  Language:  Fair  Akathisia:  No  Handed:  Right  AIMS (if indicated):     Assets:  Agricultural consultant Housing Social Support  ADL's:  Intact  Cognition:  Impaired,  Mild  Sleep:      Treatment Plan Summary: Schizoaffective disorder, bipolar type ([place])  With intellectual disability, cerebral palsy, and borderline personality disorder, baseline, chronic, stable for return to group home with  continued outpatient care.   Disposition:  -Discharge back to group home -[facility] TTS to reach out to group home and establish safety plan with direct supervision, securing of sharps and medications, and plan to call 911 in event of emergency or risk to patient safety.   Benjamine Mola, FNP 12/09/2015 11:21 AM

## 2015-12-09 NOTE — ED Notes (Signed)
Notified facility that pt is being discharged and she will notify Maren ReamerWanda Phillips and will call back with their estimated time of arrival.

## 2015-12-09 NOTE — ED Notes (Signed)
Pt offered shower, did not want one at this time.
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
## 2016-01-11 ENCOUNTER — Encounter: Payer: Self-pay | Admitting: Emergency Medicine

## 2016-01-11 ENCOUNTER — Emergency Department
Admission: EM | Admit: 2016-01-11 | Discharge: 2016-01-11 | Disposition: A | Discharge status: Home / Self Care | Payer: Medicare Other | Attending: Emergency Medicine | Admitting: Emergency Medicine

## 2016-01-11 DIAGNOSIS — J449 Chronic obstructive pulmonary disease, unspecified: Secondary | ICD-10-CM | POA: Insufficient documentation

## 2016-01-11 DIAGNOSIS — F329 Major depressive disorder, single episode, unspecified: Secondary | ICD-10-CM | POA: Diagnosis not present

## 2016-01-11 DIAGNOSIS — Z79899 Other long term (current) drug therapy: Secondary | ICD-10-CM | POA: Diagnosis not present

## 2016-01-11 DIAGNOSIS — J45909 Unspecified asthma, uncomplicated: Secondary | ICD-10-CM | POA: Diagnosis not present

## 2016-01-11 DIAGNOSIS — Z046 Encounter for general psychiatric examination, requested by authority: Secondary | ICD-10-CM | POA: Diagnosis present

## 2016-01-11 DIAGNOSIS — Z791 Long term (current) use of non-steroidal anti-inflammatories (NSAID): Secondary | ICD-10-CM | POA: Diagnosis not present

## 2016-01-11 DIAGNOSIS — Z7982 Long term (current) use of aspirin: Secondary | ICD-10-CM | POA: Insufficient documentation

## 2016-01-11 DIAGNOSIS — F603 Borderline personality disorder: Secondary | ICD-10-CM | POA: Diagnosis present

## 2016-01-11 DIAGNOSIS — F70 Mild intellectual disabilities: Secondary | ICD-10-CM | POA: Diagnosis not present

## 2016-01-11 DIAGNOSIS — F32A Depression, unspecified: Secondary | ICD-10-CM

## 2016-01-11 DIAGNOSIS — K219 Gastro-esophageal reflux disease without esophagitis: Secondary | ICD-10-CM | POA: Diagnosis present

## 2016-01-11 DIAGNOSIS — G809 Cerebral palsy, unspecified: Secondary | ICD-10-CM | POA: Diagnosis present

## 2016-01-11 LAB — CBC
HEMATOCRIT: 40.2 % (ref 35.0–47.0)
HEMOGLOBIN: 13.8 g/dL (ref 12.0–16.0)
MCH: 32.3 pg (ref 26.0–34.0)
MCHC: 34.4 g/dL (ref 32.0–36.0)
MCV: 94 fL (ref 80.0–100.0)
PLATELETS: 301 10*3/uL (ref 150–440)
RBC: 4.27 MIL/uL (ref 3.80–5.20)
RDW: 13.3 % (ref 11.5–14.5)
WBC: 6.7 10*3/uL (ref 3.6–11.0)

## 2016-01-11 LAB — URINE DRUG SCREEN, QUALITATIVE (ARMC ONLY)
Amphetamines, Ur Screen: NOT DETECTED
BARBITURATES, UR SCREEN: NOT DETECTED
BENZODIAZEPINE, UR SCRN: NOT DETECTED
Cannabinoid 50 Ng, Ur ~~LOC~~: NOT DETECTED
Cocaine Metabolite,Ur ~~LOC~~: NOT DETECTED
MDMA (Ecstasy)Ur Screen: NOT DETECTED
METHADONE SCREEN, URINE: NOT DETECTED
OPIATE, UR SCREEN: NOT DETECTED
PHENCYCLIDINE (PCP) UR S: NOT DETECTED
Tricyclic, Ur Screen: NOT DETECTED

## 2016-01-11 LAB — ETHANOL: Alcohol, Ethyl (B): <5 mg/dL (ref <5)

## 2016-01-11 LAB — COMPREHENSIVE METABOLIC PANEL
ALBUMIN: 3.9 g/dL (ref 3.5–5.0)
ALK PHOS: 48 U/L (ref 38–126)
ALT: 17 U/L (ref 14–54)
ANION GAP: 9 (ref 5–15)
AST: 20 U/L (ref 15–41)
BUN: 20 mg/dL (ref 6–20)
CHLORIDE: 102 mmol/L (ref 101–111)
CO2: 29 mmol/L (ref 22–32)
Calcium: 9.1 mg/dL (ref 8.9–10.3)
Creatinine, Ser: 0.91 mg/dL (ref 0.44–1.00)
GFR calc non Af Amer: >60 mL/min (ref >60)
GLUCOSE: 99 mg/dL (ref 65–99)
POTASSIUM: 3.9 mmol/L (ref 3.5–5.1)
SODIUM: 140 mmol/L (ref 135–145)
Total Bilirubin: 0.5 mg/dL (ref 0.3–1.2)
Total Protein: 7.3 g/dL (ref 6.5–8.1)

## 2016-01-11 LAB — ACETAMINOPHEN LEVEL

## 2016-01-11 LAB — SALICYLATE LEVEL

## 2016-01-11 MED ORDER — LORAZEPAM 1 MG PO TABS
1.0000 mg | ORAL_TABLET | Freq: Once | ORAL | Status: AC
  Administered 2016-01-11: 1 mg via ORAL
  Filled 2016-01-11: qty 1

## 2016-01-11 NOTE — ED Notes (Signed)
BEHAVIORAL HEALTH ROUNDING Patient sleeping: No. Patient alert and oriented: yes Behavior appropriate: Yes.  ; If no, describe:  Nutrition and fluids offered: yes Toileting and hygiene offered: Yes  Sitter present: q15 minute observations and security  monitoring Law enforcement present: Yes  ODS   Pt to discharge back to her group home  - we are awaiting transportation

## 2016-01-11 NOTE — ED Notes (Signed)
Pt was asked if she needed anything or if she wanted to eat but pt refused. Pt was asked if she wanted a blanket and pt refused and said she was fine

## 2016-01-11 NOTE — ED Notes (Signed)
Pt was given a dinner tray and a ginger ale. nothing else needed at this time

## 2016-01-11 NOTE — Consult Note (Signed)
[facility] Face-to-Face Psychiatry Consult   Reason for Consult:  Consult for this 50 year old woman well known to the psychiatric service. Brought here under involuntary commitment papers filed by her outpatient therapist. Referring Physician:  Marcelene Green [name] MRN:  [id number] Principal Diagnosis: Mild intellectual disability Diagnosis:   Patient Active Problem List   Diagnosis Date Noted  . Suicidal ideation [R45.851] 12/09/2015  . Schizoaffective disorder, bipolar type ([place]) [F25.0]   . Schizoaffective disorder ([place]) [F25.9] 06/17/2015  . 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 GERD (gastroesophageal reflux disease) [K21.9] 09/24/2014  . COPD (chronic obstructive pulmonary disease) ([place]) [J44.9] 09/24/2014  . Borderline personality disorder [F60.3] 09/24/2014  . Cerebral palsy ([place]) [G80.9] 08/31/2014    Total Time spent with patient: 1 hour  Subjective:   [name] is a 50 y.o. female patient admitted with "I'm having a nervous breakdown".  HPI:  Patient interviewed. Chart reviewed. Labs reviewed. 50 year old woman with a history of chronic intellectual disability, possible schizoaffective disorder, borderline personality disorder, multiple medical problems. Patient presents under the very typical situation of stating that her nerves are bad and that things at her current living situation are driving her crazy. She tells me that she went to the [employer] yesterday and had thoughts about buying pills. She did not actually attempt to follow through on this. It looks like she may have made comments to the therapist about walking out into traffic although she did not threaten to do that to me. Patient says that her nerves are bad specifically because of 2 other clients at her current group home. Both of them sound like they've been acting up and making a lot of noise which gets on [name]'s nerves. She recently moved to a new group home and  although she says that overall it is an improvement she still thinks that it is giving her a nervous breakdown. She says she has been compliant with all of her medicine. No evident medication changes recently.  Social history: Patient does not really have any active family support. She has a brother who will occasionally take her to [facility] to visit her mother who has dementia and is in a nursing home. Doesn't really have any other social contact.  Medical history: COPD. Gastric reflux. Patient has mild cerebral palsy and has some old the walking. She worries a lot about falling.  Substance abuse history: Does not abuse alcohol or drugs does not have a significant past history of substance abuse  Past Psychiatric History: Long-standing history of the same kind of symptoms she is having now. She typically will present to the emergency room claiming that she is suicidal in some vague way. She wants to be admitted to the hospital because we have groups here and we have activities which are much more interesting than what she gets to do back at her group home. Patient has had admissions in the past and does not really have any significant benefit from hospitalization. She is not having active psychotic symptoms or behavior. She is on a combination of medicines and has good intact outpatient treatment in place.  Risk to Self: Is patient at risk for suicide?: Yes Risk to Others:   Prior Inpatient Therapy:   Prior Outpatient Therapy:    Past Medical History:  Past Medical History:  Diagnosis Date  . Anxiety   . Asthma   . Borderline personality disorder   . Cerebral palsy ([place])   . Depression   . GERD (gastroesophageal reflux disease)   .  Mild cognitive impairment   . Schizoaffective disorder ([place])   . Wound abscess     Past Surgical History:  Procedure Laterality Date  . CHOLECYSTECTOMY    . INCISION AND DRAINAGE ABSCESS Right 01/02/2015   Procedure: INCISION AND DRAINAGE ABSCESS;   Surgeon: Dia Crawford III, MD;  Location: [facility] ORS;  Service: General;  Laterality: Right;  . TONSILLECTOMY     Family History:  Family History  Problem Relation Age of Onset  . Heart attack Father   . Diabetes Mother    Family Psychiatric  History: None is identified. I'm not aware that she has any family history Social History:  History  Alcohol Use No     History  Drug Use No    Social History   Social History  . Marital status: Single    Spouse name: N/A  . Number of children: N/A  . Years of education: N/A   Social History Main Topics  . Smoking status: Never Smoker  . Smokeless tobacco: Never Used  . Alcohol use No  . Drug use: No  . Sexual activity: Yes    Birth control/ protection: Implant   Other Topics Concern  . None   Social History Narrative  . None   Additional Social History:    Allergies:   Allergies  Allergen Reactions  . Penicillins Other (See Comments)    Reaction:  Unknown  Has patient had a PCN reaction causing immediate rash, facial/tongue/throat swelling, SOB or lightheadedness with hypotension: Nounknown Has patient had a PCN reaction causing severe rash involving mucus membranes or skin necrosis: Nounknown Has patient had a PCN reaction that required hospitalization Nounknown Has patient had a PCN reaction occurring within the last 10 years: Nounknown If all of the above answers are "NO", then may proceed with Cephalosporin use.     Labs:  Results for orders placed or performed during the hospital encounter of 01/11/16 (from the past 48 hour(s))  Comprehensive metabolic panel     Status: None   Collection Time: 01/11/16  3:29 PM  Result Value Ref Range   Sodium 140 135 - 145 mmol/L   Potassium 3.9 3.5 - 5.1 mmol/L   Chloride 102 101 - 111 mmol/L   CO2 29 22 - 32 mmol/L   Glucose, Bld 99 65 - 99 mg/dL   BUN 20 6 - 20 mg/dL   Creatinine, Ser 0.91 0.44 - 1.00 mg/dL   Calcium 9.1 8.9 - 10.3 mg/dL   Total Protein 7.3 6.5 - 8.1 g/dL    Albumin 3.9 3.5 - 5.0 g/dL   AST 20 15 - 41 U/L   ALT 17 14 - 54 U/L   Alkaline Phosphatase 48 38 - 126 U/L   Total Bilirubin 0.5 0.3 - 1.2 mg/dL   GFR calc non Af Amer >60 >60 mL/min   GFR calc Af Amer >60 >60 mL/min    Comment: (NOTE) The eGFR has been calculated using the CKD EPI equation. This calculation has not been validated in all clinical situations. eGFR's persistently <60 mL/min signify possible Chronic Kidney Disease.    Anion gap 9 5 - 15  cbc     Status: None   Collection Time: 01/11/16  3:29 PM  Result Value Ref Range   WBC 6.7 3.6 - 11.0 K/uL   RBC 4.27 3.80 - 5.20 MIL/uL   Hemoglobin 13.8 12.0 - 16.0 g/dL   HCT 40.2 35.0 - 47.0 %   MCV 94.0 80.0 - 100.0 fL  MCH 32.3 26.0 - 34.0 pg   MCHC 34.4 32.0 - 36.0 g/dL   RDW 13.3 11.5 - 14.5 %   Platelets 301 150 - 440 K/uL    Current Facility-Administered Medications  Medication Dose Route Frequency Provider Last Rate Last Dose  . LORazepam (ATIVAN) tablet 1 mg  1 mg Oral Once Gonzella Lex, MD       Current Outpatient Prescriptions  Medication Sig Dispense Refill  . amantadine (SYMMETREL) 100 MG capsule Take 1 capsule (100 mg total) by mouth 2 (two) times daily. 60 capsule 0  . aspirin EC 81 MG tablet Take 81 mg by mouth daily.    . clonazePAM (KLONOPIN) 0.5 MG tablet Take 0.25 mg by mouth every 6 (six) hours.    Marland Kitchen dicyclomine (BENTYL) 20 MG tablet Take 20 mg by mouth 3 (three) times daily as needed for spasms.    . divalproex (DEPAKOTE ER) 250 MG 24 hr tablet Take 500 mg by mouth 2 (two) times daily.    Marland Kitchen escitalopram (LEXAPRO) 20 MG tablet Take 30 mg by mouth daily.    . fenofibrate 54 MG tablet Take 54 mg by mouth daily.    . Fluticasone-Salmeterol (ADVAIR) 100-50 MCG/DOSE AEPB Inhale 1 puff into the lungs 2 (two) times daily.     . meloxicam (MOBIC) 15 MG tablet Take 15 mg by mouth daily as needed.     . miconazole (MICOTIN) 2 % powder Apply 1 application topically 2 (two) times daily as needed for  itching.    . ondansetron (ZOFRAN ODT) 4 MG disintegrating tablet Take 1 tablet (4 mg total) by mouth every 8 (eight) hours as needed for nausea or vomiting. 20 tablet 0  . pantoprazole (PROTONIX) 40 MG tablet Take 40 mg by mouth 2 (two) times daily.    . QUEtiapine (SEROQUEL) 50 MG tablet Take 25 mg by mouth 4 (four) times daily as needed.    . risperiDONE (RISPERDAL) 3 MG tablet Take 1 tablet (3 mg total) by mouth at bedtime. 30 tablet 0  . traMADol (ULTRAM) 50 MG tablet Take by mouth every 6 (six) hours as needed.      Musculoskeletal: Strength & Muscle Tone: decreased Gait & Station: broad based Patient leans: N/A  Psychiatric Specialty Exam: Physical Exam  Nursing note and vitals reviewed. Constitutional: She appears well-developed and well-nourished.  HENT:  Head: Normocephalic and atraumatic.  Eyes: Conjunctivae are normal. Pupils are equal, round, and reactive to light.  Neck: Normal range of motion.  Cardiovascular: Regular rhythm and normal heart sounds.   Respiratory: Effort normal. No respiratory distress.  GI: Soft.  Musculoskeletal: Normal range of motion.  Neurological: She is alert.  Skin: Skin is warm and dry.  Psychiatric: Her mood appears anxious. Her speech is delayed and slurred. She is slowed. Cognition and memory are normal. She expresses impulsivity. She expresses suicidal ideation. She expresses no suicidal plans.    Review of Systems  Constitutional: Negative.   HENT: Negative.   Eyes: Negative.   Respiratory: Negative.   Cardiovascular: Negative.   Gastrointestinal: Negative.   Musculoskeletal: Negative.   Skin: Negative.   Neurological: Negative.   Psychiatric/Behavioral: Positive for suicidal ideas. Negative for depression, hallucinations, memory loss and substance abuse. The patient is nervous/anxious. The patient does not have insomnia.     Blood pressure 131/69, pulse 74, temperature 98.4 F (36.9 C), temperature source Oral, resp. rate 20,  height _0  (1.626 m), weight 81.6 kg (180 lb), SpO2 97 %.Body  mass index is 30.9 kg/m.  General Appearance: Disheveled  Eye Contact:  Good  Speech:  Slow and Slurred  Volume:  Decreased  Mood:  Anxious  Affect:  Congruent  Thought Process:  Descriptions of Associations: Circumstantial  Orientation:  Full (Time, Place, and Person)  Thought Content:  Rumination  Suicidal Thoughts:  Yes.  without intent/plan  Homicidal Thoughts:  No  Memory:  Immediate;   Good Recent;   Fair Remote;   Fair  Judgement:  Impaired  Insight:  Shallow  Psychomotor Activity:  Decreased  Concentration:  Concentration: Fair  Recall:  [employer] of Knowledge:  Fair  Language:  Fair  Akathisia:  No  Handed:  Right  AIMS (if indicated):     Assets:  Communication Skills Desire for Improvement Financial Resources/Insurance Housing Resilience Social Support  ADL's:  Intact  Cognition:  Impaired,  Mild  Sleep:        Treatment Plan Summary: Plan 50 year old woman with chronic developmental disability. Chronic poor coping skills. She had a therapy appointment today and told her therapist the typical things that she usually says. Talks about being very nervous and about having thoughts about doing something to hurt yourself. Has not actually attempted to harm herself. She is not presenting with psychotic symptoms. Patient clearly does not actually want to die but rather feels nervous and unhappy about some circumstances at home. She is not likely to benefit from inpatient hospitalization and is at low risk to actually harm herself in the community. She has appropriate outpatient treatment and a safe living environment in place. Supportive counseling completed with the patient. No change to medicine. I have ordered a 1 time dose of Ativan for her nervousness here in the emergency room setting. Case reviewed with emergency room physician and TTS. Discontinue IVC. Patient can be discharged back to her group  home.  Disposition: Patient does not meet criteria for psychiatric inpatient admission.  Alethia Berthold, MD 01/11/2016 4:19 PM

## 2016-01-11 NOTE — ED Notes (Signed)

## 2016-01-11 NOTE — B[facility] Counselor (Signed)
TTS called group home transportation Loraine Leriche(Mark: [phone]) ... No answer.  Pt prepared for discharge ... Awaiting transportation back to group home.

## 2016-01-11 NOTE — ED Notes (Signed)
Patient was given a specimen cup but did not have to go to the restroom at this time.

## 2016-01-11 NOTE — ED Provider Notes (Signed)
Time Seen: Approximately 1549  I have reviewed the triage notes  Chief Complaint: Mental Health Problem   History of Present Illness: [name] is a 50 y.o. female who presents via [place] [employer] under involuntary commitment. She had stated that she wonder herself. Patient's frequently here for psychiatric evaluations and has a history of schizoaffective disorder, bipolar disease, etc. The patient currently has no physical complaints and denies being actively suicidal at this point.   Past Medical History:  Diagnosis Date  . Anxiety   . Asthma   . Borderline personality disorder   . Cerebral palsy ([facility])   . Depression   . GERD (gastroesophageal reflux disease)   . Mild cognitive impairment   . Schizoaffective disorder ([facility])   . Wound abscess     Patient Active Problem List   Diagnosis Date Noted  . Suicidal ideation 12/09/2015  . Schizoaffective disorder, bipolar type ([facility])   . Schizoaffective disorder ([facility]) 06/17/2015  . 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 GERD (gastroesophageal reflux disease) 09/24/2014  . COPD (chronic obstructive pulmonary disease) ([facility]) 09/24/2014  . Borderline personality disorder 09/24/2014  . Cerebral palsy ([facility]) 08/31/2014    Past Surgical History:  Procedure Laterality Date  . CHOLECYSTECTOMY    . INCISION AND DRAINAGE ABSCESS Right 01/02/2015   Procedure: INCISION AND DRAINAGE ABSCESS;  Surgeon: Tiney Rougealph Ely III, MD;  Location: [facility] ORS;  Service: General;  Laterality: Right;  . TONSILLECTOMY      Past Surgical History:  Procedure Laterality Date  . CHOLECYSTECTOMY    . INCISION AND DRAINAGE ABSCESS Right 01/02/2015   Procedure: INCISION AND DRAINAGE ABSCESS;  Surgeon: Tiney Rougealph Ely III, MD;  Location: [facility] ORS;  Service: General;  Laterality: Right;  . TONSILLECTOMY      Current Outpatient Rx  . Order #: [id number] Class: Print  . Order #: [id number] Class: Historical Med  . Order #: [id number] Class: Historical Med   . Order #: [id number] Class: Historical Med  . Order #: [id number] Class: Historical Med  . Order #: [id number] Class: Historical Med  . Order #: [id number] Class: Historical Med  . Order #: [id number] Class: Historical Med  . Order #: [id number] Class: Historical Med  . Order #: [id number] Class: Historical Med  . Order #: [id number] Class: Print  . Order #: [id number] Class: Historical Med  . Order #: [id number] Class: Historical Med  . Order #: [id number] Class: Print  . Order #: [id number] Class: Historical Med    Allergies:  Penicillins  Family History: Family History  Problem Relation Age of Onset  . Heart attack Father   . Diabetes Mother     Social History: Social History  Substance Use Topics  . Smoking status: Never Smoker  . Smokeless tobacco: Never Used  . Alcohol use No     Review of Systems:   10 point review of systems was performed and was otherwise negative:  Constitutional: No fever Eyes: No visual disturbances ENT: No sore throat, ear pain Cardiac: No chest pain Respiratory: No shortness of breath, wheezing, or stridor Abdomen: No abdominal pain, no vomiting, No diarrhea Endocrine: No weight loss, No night sweats Extremities: No peripheral edema, cyanosis Skin: No rashes, easy bruising Neurologic: No focal weakness, trouble with speech or swollowing Urologic: No dysuria, Hematuria, or urinary frequency  Physical Exam:  ED Triage Vitals [01/11/16 1527]  Enc Vitals Group     BP 131/69     Pulse Rate 74     Resp 20     Temp 98.4 F (36.9 C)     Temp  Source Oral     SpO2 97 %     Weight 180 lb (81.6 kg)     Height 5\' 4"  (1.626 m)     Head Circumference      Peak Flow      Pain Score      Pain Loc      Pain Edu?      Excl. in GC?     General: Awake , Alert , and Oriented times 3; GCS 15 Head: Normal cephalic , atraumatic Eyes: Pupils equal , round, reactive to light Nose/Throat: No nasal drainage, patent upper airway without erythema or exudate.   Neck: Supple, Full range of motion, No anterior adenopathy or palpable thyroid masses Lungs: Clear to ascultation without wheezes , rhonchi, or rales Heart: Regular rate, regular rhythm without murmurs , gallops , or rubs Abdomen: Soft, non tender without rebound, guarding , or rigidity; bowel sounds positive and symmetric in all 4 quadrants. No organomegaly .        Extremities: 2 plus symmetric pulses. No edema, clubbing or cyanosis Neurologic: normal ambulation, Motor symmetric without deficits, sensory intact Skin: warm, dry, no rashes   Labs:   All laboratory work was reviewed including any pertinent negatives or positives listed below:  Labs Reviewed  COMPREHENSIVE METABOLIC PANEL  CBC  ETHANOL  SALICYLATE LEVEL  ACETAMINOPHEN LEVEL  URINE DRUG SCREEN, QUALITATIVE ([facility] ONLY)  Laboratory work was reviewed and showed no clinically significant abnormalities.     ED Course:  Patient was seen by psychiatry here Dr. Delaney Meigs whopatient well and states this is her normal behavior and there is nothing further that needs to be offered from a inpatient psychiatric standpoint. Please see his note and evaluation The patient's IVC paperwork is been rescinded and she is cleared to be discharged. There does not appear to be any physical complaints or anything to be addressed from an emergency department standpoint. Patient's been cooperative and is oriented 3 and I felt stable for discharge Clinical Course     Assessment: * Suicidal thoughts Schizoaffective disorder      Plan:  Outpatient Continue current medications Follow-up psychiatry Patient was advised to return immediately if condition worsens. Patient was advised to follow up with their primary care physician or other specialized physicians involved in their outpatient care. The patient and/or family member/power of attorney had laboratory results reviewed at the bedside. All questions and concerns were addressed and  appropriate discharge instructions were distributed by the nursing staff.             Jennye Moccasin, MD 01/11/16 [phone]

## 2016-01-11 NOTE — ED Notes (Signed)
Patient given meal tray and sprite.  

## 2016-01-11 NOTE — ED Triage Notes (Signed)
[place] [employer] [employer] [place]   States she is stressed and wants to hurt herself

## 2016-01-11 NOTE — ED Notes (Signed)
Discharge instructions reviewed with patient. Questions fielded by this RN. Patient verbalizes understanding of instructions. Patient discharged to group home in stable condition per Huel CoteQuigley MD . No acute distress noted at time of discharge.  [name]  [facility] picked up and given verbal report.

## 2016-01-11 NOTE — ED Notes (Signed)
[facility] group home owner coming to picked in .

## 2016-03-11 ENCOUNTER — Encounter: Payer: Self-pay | Admitting: Emergency Medicine

## 2016-03-11 ENCOUNTER — Emergency Department
Admission: EM | Admit: 2016-03-11 | Discharge: 2016-03-12 | Disposition: A | Discharge status: Home / Self Care | Payer: Medicare Other | Attending: Emergency Medicine | Admitting: Emergency Medicine

## 2016-03-11 DIAGNOSIS — F419 Anxiety disorder, unspecified: Secondary | ICD-10-CM

## 2016-03-11 DIAGNOSIS — R45851 Suicidal ideations: Secondary | ICD-10-CM | POA: Diagnosis present

## 2016-03-11 DIAGNOSIS — J449 Chronic obstructive pulmonary disease, unspecified: Secondary | ICD-10-CM | POA: Insufficient documentation

## 2016-03-11 DIAGNOSIS — J45909 Unspecified asthma, uncomplicated: Secondary | ICD-10-CM | POA: Insufficient documentation

## 2016-03-11 DIAGNOSIS — Z79899 Other long term (current) drug therapy: Secondary | ICD-10-CM | POA: Diagnosis not present

## 2016-03-11 DIAGNOSIS — N390 Urinary tract infection, site not specified: Secondary | ICD-10-CM | POA: Diagnosis not present

## 2016-03-11 DIAGNOSIS — Z7982 Long term (current) use of aspirin: Secondary | ICD-10-CM | POA: Insufficient documentation

## 2016-03-11 DIAGNOSIS — R451 Restlessness and agitation: Secondary | ICD-10-CM

## 2016-03-11 LAB — URINALYSIS COMPLETE WITH MICROSCOPIC (ARMC ONLY)
Bilirubin Urine: NEGATIVE
Glucose, UA: NEGATIVE mg/dL
Ketones, ur: NEGATIVE mg/dL
NITRITE: NEGATIVE
PH: 5 (ref 5.0–8.0)
PROTEIN: NEGATIVE mg/dL
SPECIFIC GRAVITY, URINE: 1.021 (ref 1.005–1.030)

## 2016-03-11 LAB — CBC WITH DIFFERENTIAL/PLATELET
Basophils Absolute: 0.1 10*3/uL (ref 0–0.1)
Basophils Relative: 1 %
EOS ABS: 0.2 10*3/uL (ref 0–0.7)
Eosinophils Relative: 3 %
HCT: 39.9 % (ref 35.0–47.0)
HEMOGLOBIN: 14 g/dL (ref 12.0–16.0)
LYMPHS ABS: 2.7 10*3/uL (ref 1.0–3.6)
LYMPHS PCT: 31 %
MCH: 33.3 pg (ref 26.0–34.0)
MCHC: 35 g/dL (ref 32.0–36.0)
MCV: 95.1 fL (ref 80.0–100.0)
Monocytes Absolute: 1.1 10*3/uL — ABNORMAL HIGH (ref 0.2–0.9)
Monocytes Relative: 13 %
NEUTROS PCT: 52 %
Neutro Abs: 4.5 10*3/uL (ref 1.4–6.5)
Platelets: 326 10*3/uL (ref 150–440)
RBC: 4.2 MIL/uL (ref 3.80–5.20)
RDW: 13.6 % (ref 11.5–14.5)
WBC: 8.5 10*3/uL (ref 3.6–11.0)

## 2016-03-11 LAB — URINE DRUG SCREEN, QUALITATIVE (ARMC ONLY)
Amphetamines, Ur Screen: NOT DETECTED
Barbiturates, Ur Screen: NOT DETECTED
Benzodiazepine, Ur Scrn: NOT DETECTED
CANNABINOID 50 NG, UR ~~LOC~~: NOT DETECTED
COCAINE METABOLITE, UR ~~LOC~~: NOT DETECTED
MDMA (ECSTASY) UR SCREEN: NOT DETECTED
Methadone Scn, Ur: NOT DETECTED
Opiate, Ur Screen: NOT DETECTED
Phencyclidine (PCP) Ur S: NOT DETECTED
TRICYCLIC, UR SCREEN: NOT DETECTED

## 2016-03-11 LAB — COMPREHENSIVE METABOLIC PANEL
ALT: 21 U/L (ref 14–54)
ANION GAP: 8 (ref 5–15)
AST: 26 U/L (ref 15–41)
Albumin: 4 g/dL (ref 3.5–5.0)
Alkaline Phosphatase: 49 U/L (ref 38–126)
BUN: 22 mg/dL — ABNORMAL HIGH (ref 6–20)
CALCIUM: 9.5 mg/dL (ref 8.9–10.3)
CO2: 29 mmol/L (ref 22–32)
Chloride: 103 mmol/L (ref 101–111)
Creatinine, Ser: 0.8 mg/dL (ref 0.44–1.00)
GFR calc non Af Amer: >60 mL/min (ref >60)
Glucose, Bld: 101 mg/dL — ABNORMAL HIGH (ref 65–99)
POTASSIUM: 3.6 mmol/L (ref 3.5–5.1)
SODIUM: 140 mmol/L (ref 135–145)
Total Bilirubin: 0.2 mg/dL — ABNORMAL LOW (ref 0.3–1.2)
Total Protein: 7.7 g/dL (ref 6.5–8.1)

## 2016-03-11 LAB — ETHANOL: Alcohol, Ethyl (B): <5 mg/dL (ref <5)

## 2016-03-11 LAB — PREGNANCY, URINE: PREG TEST UR: NEGATIVE

## 2016-03-11 MED ORDER — FOSFOMYCIN TROMETHAMINE 3 G PO PACK
3.0000 g | PACK | Freq: Once | ORAL | Status: AC
  Administered 2016-03-11: 3 g via ORAL
  Filled 2016-03-11: qty 3

## 2016-03-11 NOTE — ED Notes (Signed)
SOC completed. Computer removed from room.

## 2016-03-11 NOTE — ED Notes (Signed)

## 2016-03-11 NOTE — ED Provider Notes (Signed)
[facility] Emergency Department Provider Note  Time seen: 8:17 PM  I have reviewed the triage vital signs and the nursing notes.   HISTORY  Chief Complaint Suicidal    HPI [name] is a 50 y.o. female patient presents from her group home with SI and HI. According to the patient the nurse at the group home told her that she is too old to be wearing about her mother. The patient states is making her very upset now she wants to kill her self and everybody else.Denies alcohol or drug use. Denies any plan to hurt herself or anyone else.  Past Medical History:  Diagnosis Date  . Anxiety   . Asthma   . Borderline personality disorder   . Cerebral palsy (HCC)   . Depression   . GERD (gastroesophageal reflux disease)   . Mild cognitive impairment   . Schizoaffective disorder (HCC)   . Wound abscess     Patient Active Problem List   Diagnosis Date Noted  . Suicidal ideation 12/09/2015  . Schizoaffective disorder, bipolar type (HCC)   . Schizoaffective disorder (HCC) 06/17/2015  . 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 GERD (gastroesophageal reflux disease) 09/24/2014  . COPD (chronic obstructive pulmonary disease) (HCC) 09/24/2014  . Borderline personality disorder 09/24/2014  . Cerebral palsy (HCC) 08/31/2014    Past Surgical History:  Procedure Laterality Date  . CHOLECYSTECTOMY    . INCISION AND DRAINAGE ABSCESS Right 01/02/2015   Procedure: INCISION AND DRAINAGE ABSCESS;  Surgeon: Tiney Rougealph Ely III, MD;  Location: [facility] ORS;  Service: General;  Laterality: Right;  . TONSILLECTOMY      Prior to Admission medications   Medication Sig Start Date End Date Taking? Authorizing Provider  amantadine (SYMMETREL) 100 MG capsule Take 1 capsule (100 mg total) by mouth 2 (two) times daily. 09/25/15 12/08/15  Minna AntisKevin Shawnita Krizek, MD  aspirin EC 81 MG tablet Take 81 mg by mouth daily.    Historical Provider, MD  clonazePAM (KLONOPIN) 0.5 MG tablet Take 0.25  mg by mouth every 6 (six) hours.    Historical Provider, MD  dicyclomine (BENTYL) 20 MG tablet Take 20 mg by mouth 3 (three) times daily as needed for spasms.    Historical Provider, MD  divalproex (DEPAKOTE ER) 250 MG 24 hr tablet Take 500 mg by mouth 2 (two) times daily.    Historical Provider, MD  escitalopram (LEXAPRO) 20 MG tablet Take 30 mg by mouth daily.    Historical Provider, MD  fenofibrate 54 MG tablet Take 54 mg by mouth daily.    Historical Provider, MD  Fluticasone-Salmeterol (ADVAIR) 100-50 MCG/DOSE AEPB Inhale 1 puff into the lungs 2 (two) times daily.     Historical Provider, MD  meloxicam (MOBIC) 15 MG tablet Take 15 mg by mouth daily as needed.     Historical Provider, MD  miconazole (MICOTIN) 2 % powder Apply 1 application topically 2 (two) times daily as needed for itching.    Historical Provider, MD  ondansetron (ZOFRAN ODT) 4 MG disintegrating tablet Take 1 tablet (4 mg total) by mouth every 8 (eight) hours as needed for nausea or vomiting. 11/02/15   Sharman CheekPhillip Stafford, MD  pantoprazole (PROTONIX) 40 MG tablet Take 40 mg by mouth 2 (two) times daily.    Historical Provider, MD  QUEtiapine (SEROQUEL) 50 MG tablet Take 25 mg by mouth 4 (four) times daily as needed.    Historical Provider, MD  risperiDONE (RISPERDAL) 3 MG tablet Take 1 tablet (3 mg total)  by mouth at bedtime. 09/25/15   Minna AntisKevin Jonan Seufert, MD  traMADol (ULTRAM) 50 MG tablet Take by mouth every 6 (six) hours as needed.    Historical Provider, MD    Allergies  Allergen Reactions  . Penicillins Other (See Comments)    Reaction:  Unknown  Has patient had a PCN reaction causing immediate rash, facial/tongue/throat swelling, SOB or lightheadedness with hypotension: Nounknown Has patient had a PCN reaction causing severe rash involving mucus membranes or skin necrosis: Nounknown Has patient had a PCN reaction that required hospitalization Nounknown Has patient had a PCN reaction occurring within the last 10 years:  Nounknown If all of the above answers are "NO", then may proceed with Cephalosporin use.     Family History  Problem Relation Age of Onset  . Heart attack Father   . Diabetes Mother     Social History Social History  Substance Use Topics  . Smoking status: Never Smoker  . Smokeless tobacco: Never Used  . Alcohol use No    Review of Systems Constitutional: Negative for fever. Cardiovascular: Negative for chest pain. Respiratory: Negative for shortness of breath. Gastrointestinal: Negative for abdominal pain Musculoskeletal: Negative for back pain. Neurological: Negative for headache 10-point ROS otherwise negative.  ____________________________________________   PHYSICAL EXAM:  VITAL SIGNS: ED Triage Vitals  Enc Vitals Group     BP 03/11/16 1932 132/88     Pulse --      Resp --      Temp 03/11/16 1932 98 F (36.7 C)     Temp Source 03/11/16 1932 Oral     SpO2 03/11/16 1932 96 %     Weight 03/11/16 1933 203 lb 4 oz (92.2 kg)     Height 03/11/16 1933 5\' 3"  (1.6 m)     Head Circumference --      Peak Flow --      Pain Score 03/11/16 1932 10     Pain Loc --      Pain Edu? --      Excl. in GC? --     Constitutional: Alert and oriented. Well appearing and in no distress. Eyes: Normal exam ENT   Head: Normocephalic and atraumatic   Mouth/Throat: Mucous membranes are moist. Cardiovascular: Normal rate, regular rhythm. No murmur Respiratory: Normal respiratory effort without tachypnea nor retractions. Breath sounds are clear  Gastrointestinal: Soft and nontender. No distention.  Musculoskeletal: Nontender with normal range of motion in all extremities. Neurologic:  Normal speech and language.  Skin:  Skin is warm, dry and intact.  Psychiatric: Suicidal ideation, homicidal ideation, no plan.  ____________________________________________    INITIAL IMPRESSION / ASSESSMENT AND PLAN / ED COURSE  Pertinent labs & imaging results that were available  during my care of the patient were reviewed by me and considered in my medical decision making (see chart for details).  Patient is a frequent visitor to the emergency department, history of schizophrenia. Patient states the nurse at the group home made her very upset tonight and this makes her want to kill herself and other people. Patient denies any plan to do so however. Patient came here voluntarily. We will have the telemetry psychiatry see the patient. I anticipate likely discharge back to her group facility.  Currently awaiting psychiatric evaluation. Patient's urine does show too numerous to count white blood cells. We will dose fosfomycin 1 in the emergency department. Patient care signed out to Dr. Dolores FrameSung.  ____________________________________________   FINAL CLINICAL IMPRESSION(S) / ED DIAGNOSES  Agitation  Minna Antis, MD 03/11/16 [phone]

## 2016-03-11 NOTE — ED Notes (Signed)
BEHAVIORAL HEALTH ROUNDING  Patient sleeping: No.  Patient alert and oriented: yes  Behavior appropriate: Yes. ; If no, describe:  Nutrition and fluids offered: Yes  Toileting and hygiene offered: Yes  Sitter present: not applicable, Q 15 min safety rounds and observation.  Law enforcement present: Yes ODS  

## 2016-03-11 NOTE — ED Triage Notes (Addendum)
Pt presents to ED with suicidal thoughts and states she "wants to hurt someone else" (the nurse at her group home) as well. Pt has hx of suicide attempt. Pt states she wants "to take a razor and scrape my skin as hard as I can tonight until it bleeds".  Pt angry at group home staff member and states she wants to "hit her because of what she said about my mama". Pt has not been taking her medications while visiting her family during the holiday the past 2 days. Pt had called mobile crisis and they attempted to give pt coping skills to deescalate the situation but pt was not willing to cooperate.

## 2016-03-11 NOTE — BH Assessment (Signed)
Assessment Note  [name] is an 50 y.o. female presenting to the  ED with suicidal thoughts and stating she "wants to hurt someone else" (the nurse at her group home). Pt has hx of multiple visits to the ED for suicidal ideations. Patient states she was concerned about her mother because her mother's doctor told her she was may be going blind.  Patient states she could not stop worrying about her mother.  She says that she's been crying a lot because she does not want anything to happen to her mom.  She says that she became angry at the group home nurse because she told her she was too old to be worrying about her mother.  Pt states she wants "to take a razor and scrape my skin as hard as I can tonight until it bleeds".  Pt angry at group home staff member and states she wants to "hit her because of what she said about my mama". She says she called mobile crisis to help her calm herself down but states she was too upset and wasn't able to use her coping skills.  She says that she came to the ED to get away from the group home "for a little bit and pull myself together".  Patient now denies SI/HI and auditory/visual hallucinations.  She denies any drug/alcohol use.  She states that she did not take her medications while visiting with her family over the holiday.  Diagnosis: Anxiety  Past Medical History:  Past Medical History:  Diagnosis Date  . Anxiety   . Asthma   . Borderline personality disorder   . Cerebral palsy (HCC)   . Depression   . GERD (gastroesophageal reflux disease)   . Mild cognitive impairment   . Schizoaffective disorder (HCC)   . Wound abscess     Past Surgical History:  Procedure Laterality Date  . CHOLECYSTECTOMY    . INCISION AND DRAINAGE ABSCESS Right 01/02/2015   Procedure: INCISION AND DRAINAGE ABSCESS;  Surgeon: Tiney Rougealph Ely III, MD;  Location: [facility] ORS;  Service: General;  Laterality: Right;  . TONSILLECTOMY      Family History:  Family History  Problem  Relation Age of Onset  . Heart attack Father   . Diabetes Mother     Social History:  reports that she has never smoked. She has never used smokeless tobacco. She reports that she does not drink alcohol or use drugs.  Additional Social History:     CIWA: CIWA-Ar BP: 132/88 COWS:    Allergies:  Allergies  Allergen Reactions  . Penicillins Other (See Comments)    Reaction:  Unknown  Has patient had a PCN reaction causing immediate rash, facial/tongue/throat swelling, SOB or lightheadedness with hypotension: Nounknown Has patient had a PCN reaction causing severe rash involving mucus membranes or skin necrosis: Nounknown Has patient had a PCN reaction that required hospitalization Nounknown Has patient had a PCN reaction occurring within the last 10 years: Nounknown If all of the above answers are "NO", then may proceed with Cephalosporin use.     Home Medications:  (Not in a hospital admission)  OB/GYN Status:  No LMP recorded. Patient has had an implant.              Risk to self with the past 6 months Is patient at risk for suicide?: Yes Substance abuse history and/or treatment for substance abuse?: No  Advance Directives (For Healthcare) Does Patient Have a Medical Advance Directive?: No          Disposition:     On Site Evaluation by:   Reviewed with Physician:    Artist Beachoxana C Dominick Morella 03/11/2016 9:10 PM

## 2016-03-11 NOTE — ED Notes (Signed)
Computer [facility] in [facility]. [facility].

## 2016-03-12 NOTE — Discharge Instructions (Signed)
You were treated with a one-time dose of antibiotic for your UTI. You were seen by psychiatry who recommends that you continue your current medications. Return to the ER for worsening symptoms, feelings of hurting yourself or others, or other concerns.

## 2016-03-12 NOTE — ED Notes (Signed)
This RN has made numerous attempts to reach [name] from the patients group home but have been unable to reach her. Charge RN agreeable to send pt home in taxi. Caregiver Willette at the group home informed and will be available to let patient into the home. [employer] called.

## 2016-03-12 NOTE — ED Notes (Signed)
BEHAVIORAL HEALTH ROUNDING  Patient sleeping: No.  Patient alert and oriented: yes  Behavior appropriate: Yes. ; If no, describe:  Nutrition and fluids offered: Yes  Toileting and hygiene offered: Yes  Sitter present: not applicable, Q 15 min safety rounds and observation.  Law enforcement present: Yes ODS  

## 2016-03-12 NOTE — ED Notes (Signed)
BEHAVIORAL HEALTH ROUNDING Patient sleeping: Yes.   Patient alert and oriented: not applicable SLEEPING Behavior appropriate: Yes.  ; If no, describe: SLEEPING Nutrition and fluids offered: No SLEEPING Toileting and hygiene offered: NoSLEEPING Sitter present: not applicable, Q 15 min safety rounds and observation. Law enforcement present: Yes ODS 

## 2016-03-12 NOTE — ED Notes (Signed)
Spoke with RooseveltWillette, caregiver at pts group home who states she will attempt to contact Ms Vear Clockhillips to let her know to pick up pt.

## 2016-03-12 NOTE — ED Provider Notes (Signed)
-----------------------------------------   12:49 AM on 03/12/2016 -----------------------------------------  [facility] Dr. Roni BreadSanfilipo who deems patient is psychiatrically cleared for discharge back to group home with outpatient follow-up with her therapist. She is to continue current psychiatric medications. Strict return precautions given. Patient verbalizes understanding and agrees with plan of care.   Irean HongJade J Sung, MD 03/12/16 [phone]

## 2016-03-12 NOTE — ED Notes (Signed)
Spoke with [place]lette , caregiver at group home again who reports she attempted to contact Ms [name] twice and was unable to get an answer. Will continue to attempt to contact Ms [name].

## 2016-03-12 NOTE — ED Notes (Signed)
Attempted to call Maren ReamerWanda Phillips from pts group home to inform her pt is going to be discharged. No answer. Unable to leave a message as her voice mail box is full.

## 2016-04-11 ENCOUNTER — Emergency Department (HOSPITAL_COMMUNITY)
Admission: EM | Admit: 2016-04-11 | Discharge: 2016-04-11 | Disposition: A | Discharge status: Home / Self Care | Payer: Medicare Other | Attending: Emergency Medicine | Admitting: Emergency Medicine

## 2016-04-11 ENCOUNTER — Encounter (HOSPITAL_COMMUNITY): Payer: Self-pay | Admitting: Emergency Medicine

## 2016-04-11 DIAGNOSIS — F603 Borderline personality disorder: Secondary | ICD-10-CM | POA: Insufficient documentation

## 2016-04-11 DIAGNOSIS — J45909 Unspecified asthma, uncomplicated: Secondary | ICD-10-CM | POA: Insufficient documentation

## 2016-04-11 DIAGNOSIS — R45851 Suicidal ideations: Secondary | ICD-10-CM | POA: Diagnosis present

## 2016-04-11 DIAGNOSIS — Z7982 Long term (current) use of aspirin: Secondary | ICD-10-CM | POA: Diagnosis not present

## 2016-04-11 DIAGNOSIS — Z79899 Other long term (current) drug therapy: Secondary | ICD-10-CM | POA: Diagnosis not present

## 2016-04-11 DIAGNOSIS — G3184 Mild cognitive impairment, so stated: Secondary | ICD-10-CM | POA: Diagnosis not present

## 2016-04-11 DIAGNOSIS — J449 Chronic obstructive pulmonary disease, unspecified: Secondary | ICD-10-CM | POA: Insufficient documentation

## 2016-04-11 DIAGNOSIS — F259 Schizoaffective disorder, unspecified: Secondary | ICD-10-CM | POA: Diagnosis not present

## 2016-04-11 LAB — COMPREHENSIVE METABOLIC PANEL
ALBUMIN: 3.5 g/dL (ref 3.5–5.0)
ALK PHOS: 39 U/L (ref 38–126)
ALT: 16 U/L (ref 14–54)
AST: 24 U/L (ref 15–41)
Anion gap: 7 (ref 5–15)
BUN: 23 mg/dL — ABNORMAL HIGH (ref 6–20)
CALCIUM: 8.4 mg/dL — AB (ref 8.9–10.3)
CO2: 25 mmol/L (ref 22–32)
CREATININE: 0.89 mg/dL (ref 0.44–1.00)
Chloride: 103 mmol/L (ref 101–111)
GFR calc Af Amer: >60 mL/min (ref >60)
GFR calc non Af Amer: >60 mL/min (ref >60)
GLUCOSE: 99 mg/dL (ref 65–99)
Potassium: 4 mmol/L (ref 3.5–5.1)
SODIUM: 135 mmol/L (ref 135–145)
Total Bilirubin: 0.6 mg/dL (ref 0.3–1.2)
Total Protein: 6.5 g/dL (ref 6.5–8.1)

## 2016-04-11 LAB — ETHANOL: Alcohol, Ethyl (B): <5 mg/dL (ref <5)

## 2016-04-11 LAB — CBC WITH DIFFERENTIAL/PLATELET
BASOS PCT: 1 %
Basophils Absolute: 0.1 10*3/uL (ref 0.0–0.1)
EOS PCT: 2 %
Eosinophils Absolute: 0.2 10*3/uL (ref 0.0–0.7)
HCT: 37.4 % (ref 36.0–46.0)
HEMOGLOBIN: 12.5 g/dL (ref 12.0–15.0)
LYMPHS ABS: 2.4 10*3/uL (ref 0.7–4.0)
Lymphocytes Relative: 28 %
MCH: 33.1 pg (ref 26.0–34.0)
MCHC: 33.4 g/dL (ref 30.0–36.0)
MCV: 98.9 fL (ref 78.0–100.0)
MONO ABS: 0.9 10*3/uL (ref 0.1–1.0)
Monocytes Relative: 11 %
NEUTROS PCT: 58 %
Neutro Abs: 4.8 10*3/uL (ref 1.7–7.7)
Platelets: 265 10*3/uL (ref 150–400)
RBC: 3.78 MIL/uL — ABNORMAL LOW (ref 3.87–5.11)
RDW: 14.2 % (ref 11.5–15.5)
WBC: 8.4 10*3/uL (ref 4.0–10.5)

## 2016-04-11 LAB — ACETAMINOPHEN LEVEL: Acetaminophen (Tylenol), Serum: <10 ug/mL — ABNORMAL LOW (ref 10–30)

## 2016-04-11 LAB — SALICYLATE LEVEL

## 2016-04-11 NOTE — BH Assessment (Signed)
Tele Assessment Note   [name] is an 50 y.o. female presenting to [facility] ED after calling mobile crisis. The patient lives at a group home in [place]. Patient states she has plan to kill herself by taking pills or scratching herself with her rings. This clinician verified with the group home, patient is never left alone or would not have the ability to go to the store on her own. Denies Hi. Reports hearing a voice telling her to hurt herself, yesterday.    Spoke to [place] at the group home, [phone] and Maren ReamerWanda Phillips, nurse at the group home, [phone]. They report patient presents with this behavior often and does not fully comprehend what she is saying. In the recent past, the patient reported she would kill herself with an eraser.  She is seen weekly by a therapist and psychiatrist, Dr. Omelia BlackwaterHeaden.   Joyice Fasterankia Lewis, NP recommends discharge. Ms. Vear Clockhillips agrees to pick up the patient this evening around 10pm and take her back to the group home.    Diagnosis: Schizoaffective disorder; Borderline personality disorder, mild cognitive impairment,   Past Medical History:  Past Medical History:  Diagnosis Date  . Anxiety   . Asthma   . Borderline personality disorder   . Cerebral palsy (HCC)   . Depression   . GERD (gastroesophageal reflux disease)   . Mild cognitive impairment   . Schizoaffective disorder (HCC)   . Wound abscess     Past Surgical History:  Procedure Laterality Date  . CHOLECYSTECTOMY    . INCISION AND DRAINAGE ABSCESS Right 01/02/2015   Procedure: INCISION AND DRAINAGE ABSCESS;  Surgeon: Tiney Rougealph Ely III, MD;  Location: [facility] ORS;  Service: General;  Laterality: Right;  . TONSILLECTOMY      Family History:  Family History  Problem Relation Age of Onset  . Heart attack Father   . Diabetes Mother     Social History:  reports that she has never smoked. She has never used smokeless tobacco. She reports that she does not drink alcohol or use  drugs.  Additional Social History:  Alcohol / Drug Use Pain Medications: see MAR Prescriptions: see MAR Over the Counter: see MAR History of alcohol / drug use?: No history of alcohol / drug abuse  CIWA: CIWA-Ar BP: 109/65 Pulse Rate: 81 COWS:    PATIENT STRENGTHS: (choose at least two) Communication skills Supportive family/friends  Allergies:  Allergies  Allergen Reactions  . Penicillins Other (See Comments)    Reaction:  Unknown  Has patient had a PCN reaction causing immediate rash, facial/tongue/throat swelling, SOB or lightheadedness with hypotension: Nounknown Has patient had a PCN reaction causing severe rash involving mucus membranes or skin necrosis: Nounknown Has patient had a PCN reaction that required hospitalization Nounknown Has patient had a PCN reaction occurring within the last 10 years: Nounknown If all of the above answers are "NO", then may proceed with Cephalosporin use.     Home Medications:  (Not in a hospital admission)  OB/GYN Status:  No LMP recorded. Patient has had an implant.  General Assessment Data [facility] Assessment: In system Is this a Tele or Face-to-Face Assessment?: Tele Assessment Is this an Initial Assessment or a Re-assessment for this encounter?: Initial Assessment Marital status: Single Is patient pregnant?: No Pregnancy Status: No Living Arrangements: Group Home Can pt return to current living arrangement?: Yes Admission Status: Voluntary Is patient capable of signing voluntary admission?: Yes Referral Source: Self/Family/Friend [facility]  Medical  Screening Exam [facility] Walk-in ONLY) Medical Exam completed: Yes  Crisis Care Plan Living Arrangements: Group Home  Education Status Is patient currently in school?: No Highest grade of school patient has completed: 10th  Risk to self with the past 6 months Suicidal Ideation: Yes-Currently Present Has patient been a  risk to self within the past 6 months prior to admission? : Yes Suicidal Intent: Yes-Currently Present Has patient had any suicidal intent within the past 6 months prior to admission? : Yes Is patient at risk for suicide?: No Suicidal Plan?: Yes-Currently Present Has patient had any suicidal plan within the past 6 months prior to admission? : No Specify Current Suicidal Plan: to scratch self or take pills Access to Means: No What has been your use of drugs/alcohol within the last 12 months?: n/a Previous Attempts/Gestures: Yes How many times?: 1 Triggers for Past Attempts: Unknown Family Suicide History: No Recent stressful life event(s): Other (Comment) (went home for christmas and now has to return to group home) Persecutory voices/beliefs?: Yes Depression: Yes Depression Symptoms: Feeling worthless/self pity Substance abuse history and/or treatment for substance abuse?: Yes Suicide prevention information given to non-admitted patients: Not applicable  Risk to Others within the past 6 months Homicidal Ideation: No Does patient have any lifetime risk of violence toward others beyond the six months prior to admission? : No Thoughts of Harm to Others: No Current Homicidal Intent: No Current Homicidal Plan: No Access to Homicidal Means: No History of harm to others?: No Assessment of Violence: None Noted Does patient have access to weapons?: No Criminal Charges Pending?: No Does patient have a court date: No Is patient on probation?: No  Psychosis Hallucinations: None noted Delusions: None noted  Mental Status Report Appearance/Hygiene: Unremarkable Eye Contact: Good Motor Activity: Freedom of movement Speech: Slow Level of Consciousness: Alert Mood: Euthymic Affect: Blunted Anxiety Level: None Thought Processes: Coherent, Relevant Judgement: Partial Orientation: Appropriate for developmental age Obsessive Compulsive Thoughts/Behaviors: None  Cognitive  Functioning Concentration: Decreased Memory: Recent Intact, Remote Intact IQ: Below Average Level of Function: mild impairment Appetite: Good Sleep: No Change  ADLScreening [facility] Assessment [facility]) Patient's cognitive ability adequate to safely complete daily activities?: Yes Patient able to express need for assistance with ADLs?: Yes Independently performs ADLs?: Yes (appropriate for developmental age)  Prior Inpatient Therapy Prior Inpatient Therapy: Yes  Prior Outpatient Therapy Prior Outpatient Therapy: Yes Prior Therapy Dates: weekly Prior Therapy Facilty/Provider(s): Dr. Omelia BlackwaterHeaden Does patient have an ACCT team?: No Does patient have Intensive In-House Services?  : No Does patient have [facility] services? : No Does patient have P4CC services?: No  ADL Screening (condition at time of admission) Patient's cognitive ability adequate to safely complete daily activities?: Yes Is the patient deaf or have difficulty hearing?: No Does the patient have difficulty seeing, even when wearing glasses/contacts?: Yes Does the patient have difficulty concentrating, remembering, or making decisions?: Yes Patient able to express need for assistance with ADLs?: Yes Does the patient have difficulty dressing or bathing?: No Independently performs ADLs?: Yes (appropriate for developmental age)       Abuse/Neglect Assessment (Assessment to be complete while patient is alone) Physical Abuse: Denies Verbal Abuse: Denies Sexual Abuse: Denies     Merchant navy officerAdvance Directives (For Healthcare) Does Patient Have a Medical Advance Directive?: No Would patient like information on creating a medical advance directive?: No - Patient declined    Additional Information 1:1 In Past 12 Months?: No CIRT Risk: No Elopement Risk: No Does patient have medical clearance?: No  Disposition:  Disposition Initial Assessment Completed for this Encounter: Yes Disposition of Patient: Referred to Hillery Jacks NP  recommends discharge back to the group home) Patient referred to: Other (Comment) (back to care of group home)  Vonzell Schlatter [facility] 04/11/2016 6:26 PM

## 2016-04-11 NOTE — ED Notes (Signed)
Patient belongings placed in the EMS locker room. Patient purse in locker room as well.

## 2016-04-11 NOTE — ED Notes (Signed)
telepsy set up in room, pt continues to eat supper

## 2016-04-11 NOTE — BH Assessment (Signed)
Sherri Fasterankia Lewis, NP recommends discharge. Maren ReamerWanda Phillips, group home nurse, agrees to pick up the patient this evening around 10pm and take her back to the group home.

## 2016-04-11 NOTE — ED Provider Notes (Signed)
[facility] DEPT Provider Note   CSN: [id number] Arrival date & time: 04/11/16  1417     History   Chief Complaint Chief Complaint  Patient presents with  . V70.1    HPI [name] is a 50 y.o. female.   Mental Health Problem  Presenting symptoms: depression and suicidal thoughts   Degree of incapacity (severity):  Moderate Onset quality:  Gradual Duration: several weeks. Timing:  Constant Progression:  Waxing and waning Chronicity:  Recurrent Context: not alcohol use, not drug abuse, not noncompliant and not recent medication change   Context comment:  Holidays Treatment compliance:  All of the time Relieved by:  Antidepressants and mood stabilizers Associated symptoms: anhedonia   Associated symptoms: no chest pain, no fatigue, no hypersomnia and no insomnia   Risk factors: hx of mental illness and hx of suicide attempts     Past Medical History:  Diagnosis Date  . Anxiety   . Asthma   . Borderline personality disorder   . Cerebral palsy (HCC)   . Depression   . GERD (gastroesophageal reflux disease)   . Mild cognitive impairment   . Schizoaffective disorder (HCC)   . Wound abscess     Patient Active Problem List   Diagnosis Date Noted  . Suicidal ideation 12/09/2015  . Schizoaffective disorder, bipolar type (HCC)   . Schizoaffective disorder (HCC) 06/17/2015  . 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 GERD (gastroesophageal reflux disease) 09/24/2014  . COPD (chronic obstructive pulmonary disease) (HCC) 09/24/2014  . Borderline personality disorder 09/24/2014  . Cerebral palsy (HCC) 08/31/2014    Past Surgical History:  Procedure Laterality Date  . CHOLECYSTECTOMY    . INCISION AND DRAINAGE ABSCESS Right 01/02/2015   Procedure: INCISION AND DRAINAGE ABSCESS;  Surgeon: Tiney Rougealph Ely III, MD;  Location: [facility] ORS;  Service: General;  Laterality: Right;  . TONSILLECTOMY      OB History    Gravida Para Term Preterm AB Living   0 0 0 0 0 0   SAB TAB Ectopic Multiple Live Births   0 0 0 0         Home Medications    Prior to Admission medications   Medication Sig Start Date End Date Taking? Authorizing Provider  amantadine (SYMMETREL) 100 MG capsule Take 1 capsule (100 mg total) by mouth 2 (two) times daily. Patient taking differently: Take 100 mg by mouth every morning.  09/25/15 04/11/16 Yes Minna AntisKevin Paduchowski, MD  aspirin EC 81 MG tablet Take 81 mg by mouth daily.   Yes Historical Provider, MD  clonazePAM (KLONOPIN) 0.5 MG tablet Take 0.5 mg by mouth 2 (two) times daily.    Yes Historical Provider, MD  divalproex (DEPAKOTE) 250 MG DR tablet Take 250 mg by mouth 2 (two) times daily.   Yes Historical Provider, MD  escitalopram (LEXAPRO) 20 MG tablet Take 30 mg by mouth daily.   Yes Historical Provider, MD  fenofibrate 54 MG tablet Take 54 mg by mouth daily.   Yes Historical Provider, MD  Fluticasone-Salmeterol (ADVAIR) 100-50 MCG/DOSE AEPB Inhale 1 puff into the lungs 2 (two) times daily.    Yes Historical Provider, MD  HYDROcodone-acetaminophen (NORCO/VICODIN) 5-325 MG tablet Take 1 tablet by mouth every 6 (six) hours as needed for moderate pain.   Yes Historical Provider, MD  hydrOXYzine (ATARAX/VISTARIL) 50 MG tablet Take 50 mg by mouth at bedtime.    Yes Historical Provider, MD  ibuprofen (ADVIL,MOTRIN) 200 MG tablet Take 200 mg by mouth every 6 (six) hours  as needed for mild pain or moderate pain.    Yes Historical Provider, MD  loperamide (IMODIUM) 2 MG capsule Take 2 mg by mouth as needed for diarrhea or loose stools.   Yes Historical Provider, MD  miconazole (MICOTIN) 2 % powder Apply 1 application topically as needed for itching.   Yes Historical Provider, MD  pantoprazole (PROTONIX) 40 MG tablet Take 40 mg by mouth 2 (two) times daily.   Yes Historical Provider, MD  QUEtiapine (SEROQUEL) 50 MG tablet Take 25 mg by mouth 4 (four) times daily as needed.   Yes Historical Provider, MD  risperiDONE (RISPERDAL) 3 MG tablet  Take 1 tablet (3 mg total) by mouth at bedtime. 09/25/15  Yes Minna Antis, MD  ondansetron (ZOFRAN ODT) 4 MG disintegrating tablet Take 1 tablet (4 mg total) by mouth every 8 (eight) hours as needed for nausea or vomiting. 11/02/15   Sharman Cheek, MD    Family History Family History  Problem Relation Age of Onset  . Heart attack Father   . Diabetes Mother     Social History Social History  Substance Use Topics  . Smoking status: Never Smoker  . Smokeless tobacco: Never Used  . Alcohol use No     Allergies   Penicillins   Review of Systems Review of Systems  Constitutional: Negative for fatigue.  Cardiovascular: Negative for chest pain.  Psychiatric/Behavioral: Positive for suicidal ideas. The patient does not have insomnia.   Ten systems are reviewed and are negative for acute change except as noted in the HPI    Physical Exam Updated Vital Signs   Physical Exam  Constitutional: She is oriented to person, place, and time. She appears well-developed and well-nourished. No distress.  HENT:  Head: Normocephalic and atraumatic.  Nose: Nose normal.  Eyes: Conjunctivae and EOM are normal. Pupils are equal, round, and reactive to light. Right eye exhibits no discharge. Left eye exhibits no discharge. No scleral icterus.  Neck: Normal range of motion. Neck supple.  Cardiovascular: Normal rate and regular rhythm.  Exam reveals no gallop and no friction rub.   No murmur heard. Pulmonary/Chest: Effort normal and breath sounds normal. No stridor. No respiratory distress. She has no rales.  Abdominal: Soft. She exhibits no distension. There is no tenderness.  Musculoskeletal: She exhibits no edema or tenderness.  Neurological: She is alert and oriented to person, place, and time.  Skin: Skin is warm and dry. No rash noted. She is not diaphoretic. No erythema.  Psychiatric: She has a normal mood and affect. Her speech is normal and behavior is normal. She expresses  suicidal ideation. She expresses suicidal plans.  Vitals reviewed.    ED Treatments / Results  Labs (all labs ordered are listed, but only abnormal results are displayed) Labs Reviewed  COMPREHENSIVE METABOLIC PANEL - Abnormal; Notable for the following:       Result Value   BUN 23 (*)    Calcium 8.4 (*)    All other components within normal limits  CBC WITH DIFFERENTIAL/PLATELET - Abnormal; Notable for the following:    RBC 3.78 (*)    All other components within normal limits  ACETAMINOPHEN LEVEL - Abnormal; Notable for the following:    Acetaminophen (Tylenol), Serum <10 (*)    All other components within normal limits  ETHANOL  SALICYLATE LEVEL  RAPID URINE DRUG SCREEN, HOSP PERFORMED    EKG  EKG Interpretation None       Radiology No results found.  Procedures Procedures (  including critical care time)  Medications Ordered in ED Medications - No data to display   Initial Impression / Assessment and Plan / ED Course  I have reviewed the triage vital signs and the nursing notes.  Pertinent labs & imaging results that were available during my care of the patient were reviewed by me and considered in my medical decision making (see chart for details).  Clinical Course as of Apr 11 2038  Tue Apr 11, 2016  1529 High risk psychiatric patient given prior suicide attempts ear for suicidal ideations. Screening labs obtained. Patient without any acute physical complaints.  [PC]  1702 [facility]   [PC]  2038 [facility]. Group home will arrange for transfer back.  The patient is safe for discharge with strict return precautions.   [PC]    Clinical Course User Index [PC] Nira ConnPedro Eduardo Nyasiah Moffet, MD      Final Clinical Impressions(s) / ED Diagnoses   Final diagnoses:  Suicidal ideation   Disposition: Discharge  Condition: Good  I have discussed the results, Dx and  Tx plan with the patient who expressed understanding and agree(s) with the plan. Discharge instructions discussed at great length. The patient was given strict return precautions who verbalized understanding of the instructions. No further questions at time of discharge.    Current Discharge Medication List      Follow Up: [facility] [address] [place] [id number]  Schedule an appointment as soon as possible for a visit        Nira ConnPedro Eduardo Symon [place], MD 04/11/16 2039

## 2016-04-11 NOTE — ED Triage Notes (Signed)
Pt from [facility]. EMS Called pt states she is having suicidal I thoughts, [place]  Plan is to take her rings and scratch herself, or go [facility] [employer] down the street and buy pills to take.

## 2016-04-11 NOTE — ED Notes (Signed)
telepsy completed

## 2016-04-11 NOTE — ED Notes (Signed)
Assumed care of patient at this time. Sitter at the bedside at this time. Labs and urine ordered. Pt changed into paper scrubs. Belongings searched and place in belongings bag and labels and secured in nursing station. 

## 2016-04-15 ENCOUNTER — Encounter (HOSPITAL_COMMUNITY): Payer: Self-pay | Admitting: Emergency Medicine

## 2016-04-15 ENCOUNTER — Emergency Department (HOSPITAL_COMMUNITY)
Admission: EM | Admit: 2016-04-15 | Discharge: 2016-04-15 | Disposition: A | Discharge status: Home / Self Care | Payer: Medicare Other | Attending: Emergency Medicine | Admitting: Emergency Medicine

## 2016-04-15 DIAGNOSIS — J029 Acute pharyngitis, unspecified: Secondary | ICD-10-CM | POA: Insufficient documentation

## 2016-04-15 DIAGNOSIS — K1379 Other lesions of oral mucosa: Secondary | ICD-10-CM | POA: Diagnosis not present

## 2016-04-15 DIAGNOSIS — Z79899 Other long term (current) drug therapy: Secondary | ICD-10-CM | POA: Diagnosis not present

## 2016-04-15 DIAGNOSIS — Z791 Long term (current) use of non-steroidal anti-inflammatories (NSAID): Secondary | ICD-10-CM | POA: Insufficient documentation

## 2016-04-15 DIAGNOSIS — J45909 Unspecified asthma, uncomplicated: Secondary | ICD-10-CM | POA: Insufficient documentation

## 2016-04-15 DIAGNOSIS — K0889 Other specified disorders of teeth and supporting structures: Secondary | ICD-10-CM | POA: Diagnosis present

## 2016-04-15 DIAGNOSIS — Z7982 Long term (current) use of aspirin: Secondary | ICD-10-CM | POA: Diagnosis not present

## 2016-04-15 MED ORDER — MAGIC MOUTHWASH W/LIDOCAINE: 10.0000 mL | Freq: Four times a day (QID) | ORAL | 0 refills | Status: DC | PRN

## 2016-04-15 MED ORDER — LIDOCAINE HCL 2 % EX GEL
1.0000 "application " | Freq: Once | CUTANEOUS | Status: AC
  Administered 2016-04-15: 1 via TOPICAL
  Filled 2016-04-15: qty 10

## 2016-04-15 NOTE — ED Provider Notes (Signed)
[facility] DE[facility] Provider Note   CSN: [id number] Arrival date & time: 04/15/16  2109     History   Chief Complaint Chief Complaint  Patient presents with  . Dental Pain    HPI [name] is a 50 y.o. female with past medical history as outlined below presenting with right sided mouth pain which started this evening.  She also describes pain in her right cheek "like someone punched me" but no one did.  She denies injury or falls and denies pain with chewing, denies dental pain but had difficulty eating tonight with this pain.  She denies fevers, headache, nasal congestion.  She does report having a sore throat. She has had no pain relievers and has found to alleviators for her symptoms.   Dental Pain      Past Medical History:  Diagnosis Date  . Anxiety   . Asthma   . Borderline personality disorder   . Cerebral palsy (HCC)   . Depression   . GERD (gastroesophageal reflux disease)   . Mild cognitive impairment   . Schizoaffective disorder (HCC)   . Wound abscess     Patient Active Problem List   Diagnosis Date Noted  . Suicidal ideation 12/09/2015  . Schizoaffective disorder, bipolar type (HCC)   . Schizoaffective disorder (HCC) 06/17/2015  . 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 GERD (gastroesophageal reflux disease) 09/24/2014  . COPD (chronic obstructive pulmonary disease) (HCC) 09/24/2014  . Borderline personality disorder 09/24/2014  . Cerebral palsy (HCC) 08/31/2014    Past Surgical History:  Procedure Laterality Date  . CHOLECYSTECTOMY    . INCISION AND DRAINAGE ABSCESS Right 01/02/2015   Procedure: INCISION AND DRAINAGE ABSCESS;  Surgeon: Tiney Rougealph Ely III, MD;  Location: [facility] ORS;  Service: General;  Laterality: Right;  . TONSILLECTOMY      OB History    Gravida Para Term Preterm AB Living   0 0 0 0 0 0   SAB TAB Ectopic Multiple Live Births   0 0 0 0         Home Medications    Prior to Admission medications   Medication Sig Start  Date End Date Taking? Authorizing Provider  amantadine (SYMMETREL) 100 MG capsule Take 1 capsule (100 mg total) by mouth 2 (two) times daily. Patient taking differently: Take 100 mg by mouth daily.  09/25/15 04/15/16 Yes Minna AntisKevin Paduchowski, MD  aspirin EC 81 MG tablet Take 81 mg by mouth daily.   Yes Historical Provider, MD  clonazePAM (KLONOPIN) 0.5 MG tablet Take 0.5 mg by mouth 2 (two) times daily.    Yes Historical Provider, MD  divalproex (DEPAKOTE) 250 MG DR tablet Take 250 mg by mouth 2 (two) times daily.   Yes Historical Provider, MD  escitalopram (LEXAPRO) 20 MG tablet Take 30 mg by mouth daily.   Yes Historical Provider, MD  fenofibrate 160 MG tablet Take 160 mg by mouth daily.   Yes Historical Provider, MD  Fluticasone-Salmeterol (ADVAIR) 100-50 MCG/DOSE AEPB Inhale 1 puff into the lungs 2 (two) times daily.    Yes Historical Provider, MD  hydrOXYzine (ATARAX/VISTARIL) 50 MG tablet Take 50 mg by mouth at bedtime.    Yes Historical Provider, MD  ibuprofen (ADVIL,MOTRIN) 200 MG tablet Take 200 mg by mouth every 6 (six) hours as needed for mild pain or moderate pain.    Yes Historical Provider, MD  miconazole (MICOTIN) 2 % powder Apply 1 application topically as needed for itching.   Yes Historical Provider, MD  pantoprazole (  PROTONIX) 40 MG tablet Take 40 mg by mouth 2 (two) times daily.   Yes Historical Provider, MD  risperiDONE (RISPERDAL) 3 MG tablet Take 1 tablet (3 mg total) by mouth at bedtime. 09/25/15  Yes Minna AntisKevin Paduchowski, MD  loperamide (IMODIUM) 2 MG capsule Take 2 mg by mouth as needed for diarrhea or loose stools.    Historical Provider, MD  magic mouthwash w/lidocaine SOLN Take 10 mLs by mouth 4 (four) times daily as needed (throat pain). Note to pharmacy - equal parts diphendydramine, aluminum hydroxide and lidocaine HCL 04/15/16   Burgess AmorJulie Trista Ciocca, PA-C    Family History Family History  Problem Relation Age of Onset  . Heart attack Father   . Diabetes Mother     Social  History Social History  Substance Use Topics  . Smoking status: Never Smoker  . Smokeless tobacco: Never Used  . Alcohol use No     Allergies   Penicillins   Review of Systems Review of Systems  Constitutional: Negative for fever.  HENT: Positive for sore throat. Negative for dental problem, facial swelling, postnasal drip, rhinorrhea and trouble swallowing.        Negative except as mentioned in HPI.   Respiratory: Negative for shortness of breath.   Cardiovascular: Negative.   Gastrointestinal: Negative.   Musculoskeletal: Negative for neck pain and neck stiffness.     Physical Exam Updated Vital Signs BP 120/81   Pulse 78   Temp 97.7 F (36.5 C) (Oral)   Resp 20   Ht 5\' 4"  (1.626 m)   Wt 92.1 kg   SpO2 97%   BMI 34.84 kg/m   Physical Exam  Constitutional: She is oriented to person, place, and time. She appears well-developed and well-nourished. No distress.  HENT:  Head: Normocephalic and atraumatic.  Right Ear: Tympanic membrane and external ear normal.  Left Ear: Tympanic membrane and external ear normal.  Mouth/Throat: Oropharynx is clear and moist and mucous membranes are normal. No oral lesions. No trismus in the jaw. No dental abscesses.  Erythema right hard palate. No edema, no drainage.   Eyes: Conjunctivae are normal.  Neck: Normal range of motion. Neck supple.  Cardiovascular: Normal rate and normal heart sounds.   Pulmonary/Chest: Effort normal.  Musculoskeletal: Normal range of motion.  Lymphadenopathy:    She has no cervical adenopathy.  Neurological: She is alert and oriented to person, place, and time.  Skin: Skin is warm and dry. No erythema.  Psychiatric: She has a normal mood and affect.     ED Treatments / Results  Labs (all labs ordered are listed, but only abnormal results are displayed) Labs Reviewed - No data to display  EKG  EKG Interpretation None       Radiology No results found.  Procedures Procedures (including  critical care time)  Medications Ordered in ED Medications  lidocaine (XYLOCAINE) 2 % jelly 1 application (1 application Topical Given 04/15/16 2230)     Initial Impression / Assessment and Plan / ED Course  I have reviewed the triage vital signs and the nursing notes.  Pertinent labs & imaging results that were available during my care of the patient were reviewed by me and considered in my medical decision making (see chart for details).  Clinical Course     Pt given lidocaine for topical use here.  Magic mouthwash for home use.  Prn f/u anticipated.  Suspect hard palate abrasion from hard food/utensil trauma, etc.   Final Clinical Impressions(s) / ED Diagnoses  Final diagnoses:  Mouth pain    New Prescriptions New Prescriptions   MAGIC MOUTHWASH W/LIDOCAINE SOLN    Take 10 mLs by mouth 4 (four) times daily as needed (throat pain). Note to pharmacy - equal parts diphendydramine, aluminum hydroxide and lidocaine HCL     Burgess Amor, PA-C 04/15/16 2316    Marily Memos, MD 04/17/16 1227

## 2016-04-15 NOTE — ED Triage Notes (Signed)
Soreness to Rt side of face, just started today.

## 2016-04-15 NOTE — Discharge Instructions (Signed)
It appears that you have either an abrasion or a mild burn (from eating hot food?) to your right side of the roof of your mouth.  This should feel better in a few days.  You may use the lidocaine provided, using a qtip to paint the site every 3 hours which can provide pain relief or you may get the magic mouthwash filled which can also help with this pain. See your doctor for a recheck if this is not improved over the next week.

## 2016-04-15 NOTE — ED Notes (Signed)
Awaiting ride from group home

## 2016-04-24 ENCOUNTER — Emergency Department
Admission: EM | Admit: 2016-04-24 | Discharge: 2016-04-24 | Disposition: A | Discharge status: Home / Self Care | Payer: Medicare Other | Attending: Student in an Organized Health Care Education/Training Program | Admitting: Student in an Organized Health Care Education/Training Program

## 2016-04-24 ENCOUNTER — Encounter: Payer: Self-pay | Admitting: Emergency Medicine

## 2016-04-24 DIAGNOSIS — F251 Schizoaffective disorder, depressive type: Secondary | ICD-10-CM

## 2016-04-24 DIAGNOSIS — J45909 Unspecified asthma, uncomplicated: Secondary | ICD-10-CM | POA: Diagnosis not present

## 2016-04-24 DIAGNOSIS — R45851 Suicidal ideations: Secondary | ICD-10-CM

## 2016-04-24 DIAGNOSIS — F603 Borderline personality disorder: Secondary | ICD-10-CM | POA: Diagnosis present

## 2016-04-24 DIAGNOSIS — J449 Chronic obstructive pulmonary disease, unspecified: Secondary | ICD-10-CM | POA: Diagnosis present

## 2016-04-24 DIAGNOSIS — Z79899 Other long term (current) drug therapy: Secondary | ICD-10-CM | POA: Diagnosis not present

## 2016-04-24 DIAGNOSIS — F70 Mild intellectual disabilities: Secondary | ICD-10-CM | POA: Diagnosis present

## 2016-04-24 DIAGNOSIS — F250 Schizoaffective disorder, bipolar type: Secondary | ICD-10-CM | POA: Diagnosis not present

## 2016-04-24 DIAGNOSIS — K219 Gastro-esophageal reflux disease without esophagitis: Secondary | ICD-10-CM | POA: Diagnosis present

## 2016-04-24 DIAGNOSIS — Z7982 Long term (current) use of aspirin: Secondary | ICD-10-CM | POA: Diagnosis not present

## 2016-04-24 DIAGNOSIS — G809 Cerebral palsy, unspecified: Secondary | ICD-10-CM | POA: Diagnosis present

## 2016-04-24 LAB — CBC
HCT: 33.1 % — ABNORMAL LOW (ref 35.0–47.0)
Hemoglobin: 11.2 g/dL — ABNORMAL LOW (ref 12.0–16.0)
MCH: 32.5 pg (ref 26.0–34.0)
MCHC: 33.8 g/dL (ref 32.0–36.0)
MCV: 96.1 fL (ref 80.0–100.0)
PLATELETS: 296 10*3/uL (ref 150–440)
RBC: 3.45 MIL/uL — ABNORMAL LOW (ref 3.80–5.20)
RDW: 14 % (ref 11.5–14.5)
WBC: 8.7 10*3/uL (ref 3.6–11.0)

## 2016-04-24 LAB — SALICYLATE LEVEL: Salicylate Lvl: <7 mg/dL (ref 2.8–30.0)

## 2016-04-24 LAB — URINE DRUG SCREEN, QUALITATIVE (ARMC ONLY)
Amphetamines, Ur Screen: NOT DETECTED
BARBITURATES, UR SCREEN: NOT DETECTED
BENZODIAZEPINE, UR SCRN: NOT DETECTED
Cannabinoid 50 Ng, Ur ~~LOC~~: NOT DETECTED
Cocaine Metabolite,Ur ~~LOC~~: NOT DETECTED
MDMA (Ecstasy)Ur Screen: POSITIVE — AB
Methadone Scn, Ur: NOT DETECTED
Opiate, Ur Screen: POSITIVE — AB
PHENCYCLIDINE (PCP) UR S: NOT DETECTED
Tricyclic, Ur Screen: NOT DETECTED

## 2016-04-24 LAB — COMPREHENSIVE METABOLIC PANEL
ALT: 26 U/L (ref 14–54)
AST: 30 U/L (ref 15–41)
Albumin: 3.4 g/dL — ABNORMAL LOW (ref 3.5–5.0)
Alkaline Phosphatase: 40 U/L (ref 38–126)
Anion gap: 1 — ABNORMAL LOW (ref 5–15)
BILIRUBIN TOTAL: 0.6 mg/dL (ref 0.3–1.2)
BUN: 11 mg/dL (ref 6–20)
CO2: 32 mmol/L (ref 22–32)
CREATININE: 0.62 mg/dL (ref 0.44–1.00)
Calcium: 9 mg/dL (ref 8.9–10.3)
Chloride: 106 mmol/L (ref 101–111)
GFR calc Af Amer: >60 mL/min (ref >60)
Glucose, Bld: 88 mg/dL (ref 65–99)
POTASSIUM: 3.6 mmol/L (ref 3.5–5.1)
Sodium: 139 mmol/L (ref 135–145)
TOTAL PROTEIN: 6.7 g/dL (ref 6.5–8.1)

## 2016-04-24 LAB — ETHANOL

## 2016-04-24 LAB — ACETAMINOPHEN LEVEL: Acetaminophen (Tylenol), Serum: <10 ug/mL — ABNORMAL LOW (ref 10–30)

## 2016-04-24 NOTE — ED Provider Notes (Signed)
[facility] Emergency Department Provider Note    First MD Initiated Contact with Patient 04/24/16 1312     (approximate)  I have reviewed the triage vital signs and the nursing notes.   HISTORY  Chief Complaint Suicidal    HPI [name] is a 51 y.o. female well-known to facility with history of blood personality disorder as cognitive impairment treatment presents with the command hallucinations that are telling her to cut her wrist. States that this started yesterday. Patient denies any stressors at her home. Denies any substance abuse. Does have access denies. Has tried to kill herself in the past.No recent fevers. Denies any other complaints.   Past Medical History:  Diagnosis Date  . Anxiety   . Asthma   . Borderline personality disorder   . Cerebral palsy (HCC)   . Depression   . GERD (gastroesophageal reflux disease)   . Mild cognitive impairment   . Schizoaffective disorder (HCC)   . Wound abscess    Family History  Problem Relation Age of Onset  . Heart attack Father   . Diabetes Mother    Past Surgical History:  Procedure Laterality Date  . CHOLECYSTECTOMY    . INCISION AND DRAINAGE ABSCESS Right 01/02/2015   Procedure: INCISION AND DRAINAGE ABSCESS;  Surgeon: Tiney Rouge III, MD;  Location: [facility] ORS;  Service: General;  Laterality: Right;  . TONSILLECTOMY     Patient Active Problem List   Diagnosis Date Noted  . Suicidal ideation 12/09/2015  . Schizoaffective disorder, bipolar type (HCC)   . Schizoaffective disorder (HCC) 06/17/2015  . 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 GERD (gastroesophageal reflux disease) 09/24/2014  . COPD (chronic obstructive pulmonary disease) (HCC) 09/24/2014  . Borderline personality disorder 09/24/2014  . Cerebral palsy (HCC) 08/31/2014      Prior to Admission medications   Medication Sig Start Date End Date Taking? Authorizing Provider  amantadine (SYMMETREL) 100 MG capsule Take 1  capsule (100 mg total) by mouth 2 (two) times daily. Patient taking differently: Take 100 mg by mouth daily.  09/25/15 04/15/16  Minna Antis, MD  aspirin EC 81 MG tablet Take 81 mg by mouth daily.    Historical Provider, MD  clonazePAM (KLONOPIN) 0.5 MG tablet Take 0.5 mg by mouth 2 (two) times daily.     Historical Provider, MD  divalproex (DEPAKOTE) 250 MG DR tablet Take 250 mg by mouth 2 (two) times daily.    Historical Provider, MD  escitalopram (LEXAPRO) 20 MG tablet Take 30 mg by mouth daily.    Historical Provider, MD  fenofibrate 160 MG tablet Take 160 mg by mouth daily.    Historical Provider, MD  Fluticasone-Salmeterol (ADVAIR) 100-50 MCG/DOSE AEPB Inhale 1 puff into the lungs 2 (two) times daily.     Historical Provider, MD  hydrOXYzine (ATARAX/VISTARIL) 50 MG tablet Take 50 mg by mouth at bedtime.     Historical Provider, MD  ibuprofen (ADVIL,MOTRIN) 200 MG tablet Take 200 mg by mouth every 6 (six) hours as needed for mild pain or moderate pain.     Historical Provider, MD  loperamide (IMODIUM) 2 MG capsule Take 2 mg by mouth as needed for diarrhea or loose stools.    Historical Provider, MD  magic mouthwash w/lidocaine SOLN Take 10 mLs by mouth 4 (four) times daily as needed (throat pain). Note to pharmacy - equal parts diphendydramine, aluminum hydroxide and lidocaine HCL 04/15/16   Burgess Amor, PA-C  miconazole (MICOTIN) 2 % powder Apply 1 application  topically as needed for itching.    Historical Provider, MD  pantoprazole (PROTONIX) 40 MG tablet Take 40 mg by mouth 2 (two) times daily.    Historical Provider, MD  risperiDONE (RISPERDAL) 3 MG tablet Take 1 tablet (3 mg total) by mouth at bedtime. 09/25/15   Minna AntisKevin Paduchowski, MD    Allergies Penicillins    Social History Social History  Substance Use Topics  . Smoking status: Never Smoker  . Smokeless tobacco: Never Used  . Alcohol use No    Review of Systems Patient denies headaches, rhinorrhea, blurry vision,  numbness, shortness of breath, chest pain, edema, cough, abdominal pain, nausea, vomiting, diarrhea, dysuria, fevers, rashes or hallucinations unless otherwise stated above in HPI. ____________________________________________   PHYSICAL EXAM:  VITAL SIGNS: Vitals:   04/24/16 1258  BP: 108/69  Pulse: 92  Resp: 18  Temp: 98.7 F (37.1 C)    Constitutional: Alert and oriented. in no acute distress. Eyes: Conjunctivae are normal. PERRL. EOMI. Head: Atraumatic. Nose: No congestion/rhinnorhea. Mouth/Throat: Mucous membranes are moist.  Oropharynx non-erythematous. Neck: No stridor. Painless ROM. No cervical spine tenderness to palpation Hematological/Lymphatic/Immunilogical: No cervical lymphadenopathy. Cardiovascular: Normal rate, regular rhythm. Grossly normal heart sounds.  Good peripheral circulation. Respiratory: Normal respiratory effort.  No retractions. Lungs CTAB. Gastrointestinal: Soft and nontender. No distention. No abdominal bruits. No CVA tenderness. Musculoskeletal: No lower extremity tenderness nor edema.  No joint effusions. Neurologic:   No gross focal neurologic deficits are appreciated. No facial droop.  No gait instability. Skin:  Skin is warm, dry and intact. No rash noted. Psychiatric: Mood and affect are normal. Speech and behavior are normal.  ____________________________________________   LABS (all labs ordered are listed, but only abnormal results are displayed)  Results for orders placed or performed during the hospital encounter of 04/24/16 (from the past 24 hour(s))  Comprehensive metabolic panel     Status: Abnormal   Collection Time: 04/24/16  1:05 PM  Result Value Ref Range   Sodium 139 135 - 145 mmol/L   Potassium 3.6 3.5 - 5.1 mmol/L   Chloride 106 101 - 111 mmol/L   CO2 32 22 - 32 mmol/L   Glucose, Bld 88 65 - 99 mg/dL   BUN 11 6 - 20 mg/dL   Creatinine, Ser 0.270.62 0.44 - 1.00 mg/dL   Calcium 9.0 8.9 - 25.310.3 mg/dL   Total Protein 6.7 6.5 -  8.1 g/dL   Albumin 3.4 (L) 3.5 - 5.0 g/dL   AST 30 15 - 41 U/L   ALT 26 14 - 54 U/L   Alkaline Phosphatase 40 38 - 126 U/L   Total Bilirubin 0.6 0.3 - 1.2 mg/dL   GFR calc non Af Amer >60 >60 mL/min   GFR calc Af Amer >60 >60 mL/min   Anion gap 1 (L) 5 - 15  Ethanol     Status: None   Collection Time: 04/24/16  1:05 PM  Result Value Ref Range   Alcohol, Ethyl (B) <5 <5 mg/dL  Salicylate level     Status: None   Collection Time: 04/24/16  1:05 PM  Result Value Ref Range   Salicylate Lvl <7.0 2.8 - 30.0 mg/dL  Acetaminophen level     Status: Abnormal   Collection Time: 04/24/16  1:05 PM  Result Value Ref Range   Acetaminophen (Tylenol), Serum <10 (L) 10 - 30 ug/mL  cbc     Status: Abnormal   Collection Time: 04/24/16  1:05 PM  Result Value Ref Range  WBC 8.7 3.6 - 11.0 K/uL   RBC 3.45 (L) 3.80 - 5.20 MIL/uL   Hemoglobin 11.2 (L) 12.0 - 16.0 g/dL   HCT 16.1 (L) 09.6 - 04.5 %   MCV 96.1 80.0 - 100.0 fL   MCH 32.5 26.0 - 34.0 pg   MCHC 33.8 32.0 - 36.0 g/dL   RDW 40.9 81.1 - 91.4 %   Platelets 296 150 - 440 K/uL  Urine Drug Screen, Qualitative     Status: Abnormal   Collection Time: 04/24/16  1:06 PM  Result Value Ref Range   Tricyclic, Ur Screen NONE DETECTED NONE DETECTED   Amphetamines, Ur Screen NONE DETECTED NONE DETECTED   MDMA (Ecstasy)Ur Screen POSITIVE (A) NONE DETECTED   Cocaine Metabolite,Ur [place] NONE DETECTED NONE DETECTED   Opiate, Ur Screen POSITIVE (A) NONE DETECTED   Phencyclidine (PCP) Ur S NONE DETECTED NONE DETECTED   Cannabinoid 50 Ng, Ur [place] NONE DETECTED NONE DETECTED   Barbiturates, Ur Screen NONE DETECTED NONE DETECTED   Benzodiazepine, Ur Scrn NONE DETECTED NONE DETECTED   Methadone Scn, Ur NONE DETECTED NONE DETECTED   ____________________________________________   ____________________________________________  RADIOLOGY   ____________________________________________   PROCEDURES  Procedure(s) performed:  Procedures    Critical Care  performed: no ____________________________________________   INITIAL IMPRESSION / ASSESSMENT AND PLAN / ED COURSE  Pertinent labs & imaging results that were available during my care of the patient were reviewed by me and considered in my medical decision making (see chart for details).  DDX: Psychosis, delirium, medication effect, noncompliance, polysubstance abuse, Si, Hi, depression   Demita Tobia is a 51 y.o. who presents to the ED with for evaluation of SI.  Patient has psych history of BPD, Schizoaffective disorder, cognitive impairment.  Laboratory testing was ordered to evaluation for underlying electrolyte derangement or signs of underlying organic pathology to explain today's presentation.  Based on history and physical and laboratory evaluation, it appears that the patient's presentation is 2/2 underlying psychiatric disorder and will require further evaluation and management by inpatient psychiatry.  Patient was  made an IVC due to command hallucinations with SI.  Disposition pending psychiatric evaluation.   Clinical Course as of Apr 24 1616  Mon Apr 24, 2016  1615 Patient has been evaluated by psychiatry at bedside. Well-known to this facility and felt to be safe for discharge back to group home. Has had a long history of frequent presentations to the ER for similar complaints.  Patient has good support network at home. Currently denying any SI or HI. Not having any hallucinations.  Have discussed with the patient and available family all diagnostics and treatments performed thus far and all questions were answered to the best of my ability. The patient demonstrates understanding and agreement with plan.   [PR]    Clinical Course User Index [PR] Willy Eddy, MD     ____________________________________________   FINAL CLINICAL IMPRESSION(S) / ED DIAGNOSES  Final diagnoses:  Suicidal thoughts      NEW MEDICATIONS STARTED DURING THIS VISIT:  New Prescriptions    No medications on file     Note:  This document was prepared using Dragon voice recognition software and may include unintentional dictation errors.    Willy Eddy, MD 04/24/16 [id number]

## 2016-04-24 NOTE — ED Notes (Signed)
[name] [employer] called for pt to be taking back to group home.

## 2016-04-24 NOTE — ED Notes (Signed)
Pt dressed out by this tech and Stephen,RN. Pt belongings consisted of three white rings, three yellow rings, a white watch, black tennis shoes, black and gray jacket, white bra, black sweat shirt, white socks, burgundy purse, blue jeans and a pair of blue panties. Pt states she has "$290 in her purse" Stephen,RN counted the money and pt has (1) $100 bill, (3) $50 bills, (1) $20 bill, (3) $5 bills, (4) $1 bills, (7) quarters, (6) dimes, (7) nickles, and (19) pennies. A total of $291.89. Pt is requesting that her purse be locked up with security so we "would not have to worry about her money."

## 2016-04-24 NOTE — Consult Note (Signed)
[name]   Reason for Consult:  Consult for 51 year old woman with a history of chronic mental illness who has had her self brought to the emergency room Referring Physician:  Quentin Cornwall [name] MRN:  [id number] Principal Diagnosis: Schizoaffective disorder [facility]) Diagnosis:   Patient Active Problem List   Diagnosis Date Noted  . Suicidal ideation [R45.851] 12/09/2015  . Schizoaffective disorder, bipolar type ([place]) [F25.0]   . Schizoaffective disorder ([place]) [F25.9] 06/17/2015  . 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 GERD (gastroesophageal reflux disease) [K21.9] 09/24/2014  . COPD (chronic obstructive pulmonary disease) ([place]) [J44.9] 09/24/2014  . Borderline personality disorder [F60.3] 09/24/2014  . Cerebral palsy ([place]) [G80.9] 08/31/2014    Total Time spent with patient: 1 hour  Subjective:   [name] is a 51 y.o. female patient admitted with "I need to be in the hospital".  HPI:  51 year old woman well known from multiple prior encounters at her self brought to the emergency room. Patient tells me that she wants to cut herself on the wrist and wants to die. This is her usual presentation. She has not actually done anything to cut herself on the wrist. Patient says she is upset because to staff people were squabbling at her group home which made her feel bad. She describes herself as feeling "scared". Patient is not a very detailed historian tends to repeat herself in a stereotyped manner. She denies any homicidal ideation. Denies having a good hallucinations. She says she has been compliant with medication.  Social history: Patient has a legal guardian. She resides in a group home. She has a long history of disliking group homes. Often gets upset especially when there are issues around her family.  Medical history: Mild cerebral palsy. Also COPD gastric reflux  Substance abuse history: No current or past  substance abuse problem  Past Psychiatric History: Patient has a history of multiple presentations to the hospital. She is been admitted to the hospital in the past and typically once admitted is euthymic and calm. No actual history of seriously trying to kill her self in the past. Tends to act out wanting to get into the hospital when she dislikes her home environment.  Risk to Self: Is patient at risk for suicide?: Yes Risk to Others:   Prior Inpatient Therapy:   Prior Outpatient Therapy:    Past Medical History:  Past Medical History:  Diagnosis Date  . Anxiety   . Asthma   . Borderline personality disorder   . Cerebral palsy ([place])   . Depression   . GERD (gastroesophageal reflux disease)   . Mild cognitive impairment   . Schizoaffective disorder ([place])   . Wound abscess     Past Surgical History:  Procedure Laterality Date  . CHOLECYSTECTOMY    . INCISION AND DRAINAGE ABSCESS Right 01/02/2015   Procedure: INCISION AND DRAINAGE ABSCESS;  Surgeon: Dia Crawford III, MD;  Location: [facility] ORS;  Service: General;  Laterality: Right;  . TONSILLECTOMY     Family History:  Family History  Problem Relation Age of Onset  . Heart attack Father   . Diabetes Mother    Family Psychiatric  History: Nonidentified Social History:  History  Alcohol Use No     History  Drug Use No    Social History   Social History  . Marital status: Single    Spouse name: N/A  . Number of children: N/A  . Years of education: N/A   Social History Main  Topics  . Smoking status: Never Smoker  . Smokeless tobacco: Never Used  . Alcohol use No  . Drug use: No  . Sexual activity: Yes    Birth control/ protection: Implant   Other Topics Concern  . None   Social History Narrative  . None   Additional Social History:    Allergies:   Allergies  Allergen Reactions  . Penicillins Other (See Comments)    Reaction:  Unknown  Has patient had a PCN reaction causing immediate rash,  facial/tongue/throat swelling, SOB or lightheadedness with hypotension: Nounknown Has patient had a PCN reaction causing severe rash involving mucus membranes or skin necrosis: Nounknown Has patient had a PCN reaction that required hospitalization Nounknown Has patient had a PCN reaction occurring within the last 10 years: Nounknown If all of the above answers are "NO", then may proceed with Cephalosporin use.     Labs:  Results for orders placed or performed during the hospital encounter of 04/24/16 (from the past 48 hour(s))  Comprehensive metabolic panel     Status: Abnormal   Collection Time: 04/24/16  1:05 PM  Result Value Ref Range   Sodium 139 135 - 145 mmol/L    Comment: LYTES REPEATED JLJ   Potassium 3.6 3.5 - 5.1 mmol/L   Chloride 106 101 - 111 mmol/L   CO2 32 22 - 32 mmol/L   Glucose, Bld 88 65 - 99 mg/dL   BUN 11 6 - 20 mg/dL   Creatinine, Ser 0.62 0.44 - 1.00 mg/dL   Calcium 9.0 8.9 - 10.3 mg/dL   Total Protein 6.7 6.5 - 8.1 g/dL   Albumin 3.4 (L) 3.5 - 5.0 g/dL   AST 30 15 - 41 U/L   ALT 26 14 - 54 U/L   Alkaline Phosphatase 40 38 - 126 U/L   Total Bilirubin 0.6 0.3 - 1.2 mg/dL   GFR calc non Af Amer >60 >60 mL/min   GFR calc Af Amer >60 >60 mL/min    Comment: (NOTE) The eGFR has been calculated using the CKD EPI equation. This calculation has not been validated in all clinical situations. eGFR's persistently <60 mL/min signify possible Chronic Kidney Disease.    Anion gap 1 (L) 5 - 15  Ethanol     Status: None   Collection Time: 04/24/16  1:05 PM  Result Value Ref Range   Alcohol, Ethyl (B) <5 <5 mg/dL    Comment:        LOWEST DETECTABLE LIMIT FOR SERUM ALCOHOL IS 5 mg/dL FOR MEDICAL PURPOSES ONLY   Salicylate level     Status: None   Collection Time: 04/24/16  1:05 PM  Result Value Ref Range   Salicylate Lvl <9.6 2.8 - 30.0 mg/dL  Acetaminophen level     Status: Abnormal   Collection Time: 04/24/16  1:05 PM  Result Value Ref Range   Acetaminophen  (Tylenol), Serum <10 (L) 10 - 30 ug/mL    Comment:        THERAPEUTIC CONCENTRATIONS VARY SIGNIFICANTLY. A RANGE OF 10-30 ug/mL MAY BE AN EFFECTIVE CONCENTRATION FOR MANY PATIENTS. HOWEVER, SOME ARE BEST TREATED AT CONCENTRATIONS OUTSIDE THIS RANGE. ACETAMINOPHEN CONCENTRATIONS >150 ug/mL AT 4 HOURS AFTER INGESTION AND >50 ug/mL AT 12 HOURS AFTER INGESTION ARE OFTEN ASSOCIATED WITH TOXIC REACTIONS.   cbc     Status: Abnormal   Collection Time: 04/24/16  1:05 PM  Result Value Ref Range   WBC 8.7 3.6 - 11.0 K/uL   RBC 3.45 (L) 3.80 -  5.20 MIL/uL   Hemoglobin 11.2 (L) 12.0 - 16.0 g/dL   HCT 33.1 (L) 35.0 - 47.0 %   MCV 96.1 80.0 - 100.0 fL   MCH 32.5 26.0 - 34.0 pg   MCHC 33.8 32.0 - 36.0 g/dL   RDW 14.0 11.5 - 14.5 %   Platelets 296 150 - 440 K/uL  Urine Drug Screen, Qualitative     Status: Abnormal   Collection Time: 04/24/16  1:06 PM  Result Value Ref Range   Tricyclic, Ur Screen NONE DETECTED NONE DETECTED   Amphetamines, Ur Screen NONE DETECTED NONE DETECTED   MDMA (Ecstasy)Ur Screen POSITIVE (A) NONE DETECTED   Cocaine Metabolite,Ur [place] NONE DETECTED NONE DETECTED   Opiate, Ur Screen POSITIVE (A) NONE DETECTED   Phencyclidine (PCP) Ur S NONE DETECTED NONE DETECTED   Cannabinoid 50 Ng, Ur [place] NONE DETECTED NONE DETECTED   Barbiturates, Ur Screen NONE DETECTED NONE DETECTED   Benzodiazepine, Ur Scrn NONE DETECTED NONE DETECTED   Methadone Scn, Ur NONE DETECTED NONE DETECTED    Comment: (NOTE) 622  Tricyclics, urine               Cutoff 1000 ng/mL 200  Amphetamines, urine             Cutoff 1000 ng/mL 300  MDMA (Ecstasy), urine           Cutoff 500 ng/mL 400  Cocaine Metabolite, urine       Cutoff 300 ng/mL 500  Opiate, urine                   Cutoff 300 ng/mL 600  Phencyclidine (PCP), urine      Cutoff 25 ng/mL 700  Cannabinoid, urine              Cutoff 50 ng/mL 800  Barbiturates, urine             Cutoff 200 ng/mL 900  Benzodiazepine, urine           Cutoff 200  ng/mL 1000 Methadone, urine                Cutoff 300 ng/mL 1100 1200 The urine drug screen provides only a preliminary, unconfirmed 1300 analytical test result and should not be used for non-medical 1400 purposes. Clinical consideration and professional judgment should 1500 be applied to any positive drug screen result due to possible 1600 interfering substances. A more specific alternate chemical method 1700 must be used in order to obtain a confirmed analytical result.  1800 Gas chromato graphy / mass spectrometry (GC/MS) is the preferred 1900 confirmatory method.     No current facility-administered medications for this encounter.    Current Outpatient Prescriptions  Medication Sig Dispense Refill  . amantadine (SYMMETREL) 100 MG capsule Take 1 capsule (100 mg total) by mouth 2 (two) times daily. (Patient taking differently: Take 100 mg by mouth daily. ) 60 capsule 0  . aspirin EC 81 MG tablet Take 81 mg by mouth daily.    . clonazePAM (KLONOPIN) 0.5 MG tablet Take 0.5 mg by mouth 2 (two) times daily.     . divalproex (DEPAKOTE) 250 MG DR tablet Take 250 mg by mouth 2 (two) times daily.    Marland Kitchen escitalopram (LEXAPRO) 20 MG tablet Take 30 mg by mouth daily.    . fenofibrate 160 MG tablet Take 160 mg by mouth daily.    . Fluticasone-Salmeterol (ADVAIR) 100-50 MCG/DOSE AEPB Inhale 1 puff into the lungs 2 (two) times  daily.     . hydrOXYzine (ATARAX/VISTARIL) 50 MG tablet Take 50 mg by mouth at bedtime.     Marland Kitchen ibuprofen (ADVIL,MOTRIN) 200 MG tablet Take 200 mg by mouth every 6 (six) hours as needed for mild pain or moderate pain.     Marland Kitchen loperamide (IMODIUM) 2 MG capsule Take 2 mg by mouth as needed for diarrhea or loose stools.    . magic mouthwash w/lidocaine SOLN Take 10 mLs by mouth 4 (four) times daily as needed (throat pain). Note to pharmacy - equal parts diphendydramine, aluminum hydroxide and lidocaine HCL 120 mL 0  . miconazole (MICOTIN) 2 % powder Apply 1 application topically as  needed for itching.    . pantoprazole (PROTONIX) 40 MG tablet Take 40 mg by mouth 2 (two) times daily.    . risperiDONE (RISPERDAL) 3 MG tablet Take 1 tablet (3 mg total) by mouth at bedtime. 30 tablet 0    Musculoskeletal: Strength & Muscle Tone: decreased Gait & Station: ataxic Patient leans: N/A  Psychiatric Specialty Exam: Physical Exam  Nursing note and vitals reviewed. Constitutional: She appears well-developed and well-nourished.  HENT:  Head: Normocephalic and atraumatic.  Eyes: Conjunctivae are normal. Pupils are equal, round, and reactive to light.  Neck: Normal range of motion.  Cardiovascular: Regular rhythm and normal heart sounds.   Respiratory: Effort normal. No respiratory distress.  GI: Soft.  Musculoskeletal: Normal range of motion.  Neurological: She is alert.  Skin: Skin is warm and dry.  Psychiatric: Her affect is blunt. Her speech is delayed. She is slowed. Cognition and memory are impaired. She expresses impulsivity. She expresses suicidal ideation. She expresses no suicidal plans.    Review of Systems  Constitutional: Negative.   HENT: Negative.   Eyes: Negative.   Respiratory: Negative.   Cardiovascular: Negative.   Gastrointestinal: Negative.   Musculoskeletal: Negative.   Skin: Negative.   Neurological: Negative.   Psychiatric/Behavioral: Positive for depression and suicidal ideas. Negative for hallucinations, memory loss and substance abuse. The patient is nervous/anxious and has insomnia.     Blood pressure 108/69, pulse 92, temperature 98.7 F (37.1 C), temperature source Oral, resp. rate 18, height _0  (1.6 m), weight 92.1 kg (203 lb), SpO2 97 %.Body mass index is 35.96 kg/m.  General Appearance: Casual  Eye Contact:  Good  Speech:  Slow and Slurred  Volume:  Decreased  Mood:  Anxious  Affect:  Congruent  Thought Process:  Disorganized  Orientation:  Full (Time, Place, and Person)  Thought Content:  Illogical and Tangential  Suicidal  Thoughts:  Yes.  without intent/plan  Homicidal Thoughts:  No  Memory:  Immediate;   Fair Recent;   Fair Remote;   Fair  Judgement:  Impaired  Insight:  Shallow  Psychomotor Activity:  Mannerisms and Restlessness  Concentration:  Concentration: Fair  Recall:  [employer] of Knowledge:  Fair  Language:  Fair  Akathisia:  No  Handed:  Right  AIMS (if indicated):     Assets:  Agricultural consultant Housing Resilience Social Support  ADL's:  Intact  Cognition:  Impaired,  Mild  Sleep:        Treatment Plan Summary: Plan 51 year old woman with long history of acting out and behavior problems and suicidal threats without a history of actual dangerous behavior. Current presentation is consistent with all prior encounters. I made some effort to try and offer some support and probe for what might be stressful for her. Patient is low risk  for trying to harm herself and not likely to benefit from hospitalization. Case reviewed with emergency room doctor. Patient can be discharged back to her group home and will follow-up with her usual psychiatric provider in the community.  Disposition: Patient does not meet criteria for psychiatric inpatient admission. Supportive therapy provided about ongoing stressors.  Alethia Berthold, MD 04/24/2016 4:45 PM

## 2016-04-24 NOTE — ED Triage Notes (Signed)
[place], reports she wants to kill herself with a knife.

## 2016-04-25 NOTE — BH Assessment (Signed)
Assessment Note  [name] is an 51 y.o. female who presents to the ER via law enforcement, due to voicing SI. Patient states, she have the plan of cutting herself and or "walk in front of a car." Patient is known in the ER due to multiple ER visits with the same complaint. She will come to the ER because she's upset with someone at the Group Home and or because she's bored. With each visit, she reports of having the same plan. When in fact she have no history of attempts or gestures. The most she have done, is starch the surface of her forearm with a plastic butter knife from a fast food restaurant. She did not break skin.  In the recent past, a community provider told her, she's running the risk of getting charge of misuse of 911. From that point, she started walking to the local store and request others to call 911 for her, so she can come to the ER.  During the interview patient was calm, cooperative and polite.  She denied HI and AV/H.  Diagnosis: Schizoaffective Disorder, Bipolar Type  Past Medical History:  Past Medical History:  Diagnosis Date  . Anxiety   . Asthma   . Borderline personality disorder   . Cerebral palsy (HCC)   . Depression   . GERD (gastroesophageal reflux disease)   . Mild cognitive impairment   . Schizoaffective disorder (HCC)   . Wound abscess     Past Surgical History:  Procedure Laterality Date  . CHOLECYSTECTOMY    . INCISION AND DRAINAGE ABSCESS Right 01/02/2015   Procedure: INCISION AND DRAINAGE ABSCESS;  Surgeon: Tiney Rouge III, MD;  Location: [facility] ORS;  Service: General;  Laterality: Right;  . TONSILLECTOMY      Family History:  Family History  Problem Relation Age of Onset  . Heart attack Father   . Diabetes Mother     Social History:  reports that she has never smoked. She has never used smokeless tobacco. She reports that she does not drink alcohol or use drugs.  Additional Social History:  Alcohol / Drug Use Pain Medications: See  PTA Prescriptions: See PTA Over the Counter: See PTA History of alcohol / drug use?: No history of alcohol / drug abuse Longest period of sobriety (when/how long): Denies current and past use of mind-altering substances Negative Consequences of Use:  (n/a) Withdrawal Symptoms:  (n/a)  CIWA: CIWA-Ar BP: 124/72 Pulse Rate: 74 COWS:    Allergies:  Allergies  Allergen Reactions  . Penicillins Other (See Comments)    Reaction:  Unknown  Has patient had a PCN reaction causing immediate rash, facial/tongue/throat swelling, SOB or lightheadedness with hypotension: Nounknown Has patient had a PCN reaction causing severe rash involving mucus membranes or skin necrosis: Nounknown Has patient had a PCN reaction that required hospitalization Nounknown Has patient had a PCN reaction occurring within the last 10 years: Nounknown If all of the above answers are "NO", then may proceed with Cephalosporin use.     Home Medications:  (Not in a hospital admission)  OB/GYN Status:  No LMP recorded. Patient has had an implant.  General Assessment Data Location of Assessment: [facility] ED TTS Assessment: In system Is this a Tele or Face-to-Face Assessment?: Face-to-Face Is this an Initial Assessment or a Re-assessment for this encounter?: Initial Assessment Marital status: Single Maiden name: n/a Is patient pregnant?: No Pregnancy Status: No Living Arrangements: Group Home [facility] Can pt return to current living arrangement?:  Yes Admission Status: Voluntary Is patient capable of signing voluntary admission?: Yes Referral Source: Self/Family/Friend Insurance type: Medicare  Medical Screening Exam [facility] Walk-in ONLY) Medical Exam completed: Yes  Crisis Care Plan Living Arrangements: Group Home ([facility] Legal Guardian: Other: (Self) Name of Psychiatrist: Dr. Dolores Frame Name of Therapist: [facility]  Education Status Is patient currently  in school?: No Current Grade: n/a Highest grade of school patient has completed: 10th Grade Name of school: n/a Contact person: n/a  Risk to self with the past 6 months Suicidal Ideation: Yes-Currently Present Has patient been a risk to self within the past 6 months prior to admission? : No Suicidal Intent: No Has patient had any suicidal intent within the past 6 months prior to admission? : No Is patient at risk for suicide?: No Suicidal Plan?: Yes-Currently Present Has patient had any suicidal plan within the past 6 months prior to admission? : Yes Specify Current Suicidal Plan: Cut self and walk into traffic (History of no attempts, only statements) Access to Means: No What has been your use of drugs/alcohol within the last 12 months?: Reports of none Previous Attempts/Gestures: No How many times?: 0 Other Self Harm Risks: Reports of none Triggers for Past Attempts: None known Intentional Self Injurious Behavior: None Family Suicide History: No Recent stressful life event(s): Other (Comment) (Do not like her Group Home) Persecutory voices/beliefs?: No Depression: Yes Depression Symptoms: Feeling worthless/self pity Substance abuse history and/or treatment for substance abuse?: No Suicide prevention information given to non-admitted patients: Not applicable  Risk to Others within the past 6 months Homicidal Ideation: No Does patient have any lifetime risk of violence toward others beyond the six months prior to admission? : No Thoughts of Harm to Others: No Current Homicidal Intent: No Current Homicidal Plan: No Access to Homicidal Means: No Identified Victim: Reports of none History of harm to others?: No Assessment of Violence: None Noted Violent Behavior Description: Reports of none Does patient have access to weapons?: No Criminal Charges Pending?: No Does patient have a court date: No Is patient on probation?: No  Psychosis Hallucinations: None noted Delusions:  None noted  Mental Status Report Appearance/Hygiene: Unremarkable, In scrubs Eye Contact: Fair Motor Activity: Freedom of movement, Unremarkable Speech: Soft, Slow Level of Consciousness: Alert Mood: Depressed, Helpless, Pleasant Affect: Appropriate to circumstance, Sad Anxiety Level: None Thought Processes: Coherent, Relevant Judgement: Unimpaired Orientation: Person, Place, Time, Situation, Appropriate for developmental age Obsessive Compulsive Thoughts/Behaviors: Minimal  Cognitive Functioning Concentration: Normal Memory: Recent Intact, Remote Intact IQ: Average Insight: Fair Impulse Control: Good Appetite: Good Weight Loss: 0 Weight Gain: 0 Sleep: No Change Total Hours of Sleep: 8 Vegetative Symptoms: None  ADLScreening [facility] Assessment Services) Patient's cognitive ability adequate to safely complete daily activities?: Yes Patient able to express need for assistance with ADLs?: Yes Independently performs ADLs?: Yes (appropriate for developmental age)  Prior Inpatient Therapy Prior Inpatient Therapy: Yes Prior Therapy Dates: Multiple psych admissions & ER Visits Prior Therapy Facilty/Provider(s): [facility], [facility] Reason for Treatment: SI  Prior Outpatient Therapy Prior Outpatient Therapy: Yes Prior Therapy Dates: Current Prior Therapy Facilty/Provider(s): [facility] (Dr. Dolores Frame) Reason for Treatment: Schizoaffective disorder, bipolar type  Does patient have an ACCT team?: No Does patient have Intensive In-House Services?  : No Does patient have [facility] services? : No Does patient have P4CC services?: No  ADL Screening (condition at time of admission) Patient's cognitive ability adequate to safely complete daily activities?: Yes  Is the patient deaf or have difficulty hearing?: No Does the patient have difficulty seeing, even when wearing glasses/contacts?: No Does the patient have difficulty concentrating, remembering, or making  decisions?: No Patient able to express need for assistance with ADLs?: Yes Does the patient have difficulty dressing or bathing?: No Independently performs ADLs?: Yes (appropriate for developmental age) Does the patient have difficulty walking or climbing stairs?: No Weakness of Legs: None Weakness of Arms/Hands: None  Home Assistive Devices/Equipment Home Assistive Devices/Equipment: None  Therapy Consults (therapy consults require a physician order) PT Evaluation Needed: No OT Evalulation Needed: No SLP Evaluation Needed: No Abuse/Neglect Assessment (Assessment to be complete while patient is alone) Physical Abuse: Denies Verbal Abuse: Denies Sexual Abuse: Denies Exploitation of patient/patient's resources: Denies Self-Neglect: Denies Values / Beliefs Cultural Requests During Hospitalization: None Spiritual Requests During Hospitalization: None Consults Spiritual Care Consult Needed: No Social Work Consult Needed: No Merchant navy officerAdvance Directives (For Healthcare) Does Patient Have a Medical Advance Directive?: No Would patient like information on creating a medical advance directive?: No - Patient declined    Additional Information 1:1 In Past 12 Months?: No CIRT Risk: No Elopement Risk: No Does patient have medical clearance?: Yes  Child/Adolescent Assessment Running Away Risk: Denies (Patient is an adult)  Disposition:  Disposition Initial Assessment Completed for this Encounter: Yes Disposition of Patient: Other dispositions (ER MD ordered Psych Consult)  On Site Evaluation by:   Reviewed with Physician:    Lilyan Gilfordalvin J. Verlan Grotz MS, LCAS, LPC, NCC, CCSI Therapeutic Triage Specialist 04/24/2016

## 2016-07-15 ENCOUNTER — Encounter: Payer: Self-pay | Admitting: Emergency Medicine

## 2016-07-15 ENCOUNTER — Emergency Department: Payer: Medicare Other

## 2016-07-15 ENCOUNTER — Emergency Department
Admission: EM | Admit: 2016-07-15 | Discharge: 2016-07-15 | Disposition: A | Discharge status: Home / Self Care | Payer: Medicare Other | Attending: Emergency Medicine | Admitting: Emergency Medicine

## 2016-07-15 DIAGNOSIS — J45909 Unspecified asthma, uncomplicated: Secondary | ICD-10-CM | POA: Insufficient documentation

## 2016-07-15 DIAGNOSIS — Z7982 Long term (current) use of aspirin: Secondary | ICD-10-CM | POA: Insufficient documentation

## 2016-07-15 DIAGNOSIS — R197 Diarrhea, unspecified: Secondary | ICD-10-CM | POA: Diagnosis present

## 2016-07-15 DIAGNOSIS — K219 Gastro-esophageal reflux disease without esophagitis: Secondary | ICD-10-CM | POA: Diagnosis not present

## 2016-07-15 DIAGNOSIS — A084 Viral intestinal infection, unspecified: Secondary | ICD-10-CM

## 2016-07-15 DIAGNOSIS — Z79899 Other long term (current) drug therapy: Secondary | ICD-10-CM | POA: Insufficient documentation

## 2016-07-15 LAB — COMPREHENSIVE METABOLIC PANEL
ALBUMIN: 3.5 g/dL (ref 3.5–5.0)
ALK PHOS: 44 U/L (ref 38–126)
ALT: 34 U/L (ref 14–54)
ANION GAP: 7 (ref 5–15)
AST: 45 U/L — AB (ref 15–41)
BILIRUBIN TOTAL: 0.5 mg/dL (ref 0.3–1.2)
BUN: 21 mg/dL — AB (ref 6–20)
CO2: 26 mmol/L (ref 22–32)
Calcium: 9 mg/dL (ref 8.9–10.3)
Chloride: 105 mmol/L (ref 101–111)
Creatinine, Ser: 0.76 mg/dL (ref 0.44–1.00)
GFR calc Af Amer: >60 mL/min (ref >60)
GFR calc non Af Amer: >60 mL/min (ref >60)
GLUCOSE: 91 mg/dL (ref 65–99)
POTASSIUM: 4 mmol/L (ref 3.5–5.1)
SODIUM: 138 mmol/L (ref 135–145)
TOTAL PROTEIN: 6.7 g/dL (ref 6.5–8.1)

## 2016-07-15 LAB — CBC
HCT: 39.4 % (ref 35.0–47.0)
HEMOGLOBIN: 13.4 g/dL (ref 12.0–16.0)
MCH: 33 pg (ref 26.0–34.0)
MCHC: 33.9 g/dL (ref 32.0–36.0)
MCV: 97.4 fL (ref 80.0–100.0)
Platelets: 323 10*3/uL (ref 150–440)
RBC: 4.05 MIL/uL (ref 3.80–5.20)
RDW: 13.9 % (ref 11.5–14.5)
WBC: 8.3 10*3/uL (ref 3.6–11.0)

## 2016-07-15 LAB — TROPONIN I
Troponin I: <0.03 ng/mL (ref <0.03)
Troponin I: <0.03 ng/mL (ref <0.03)

## 2016-07-15 MED ORDER — ONDANSETRON HCL 4 MG/2ML IJ SOLN: 4.0000 mg | Freq: Once | INTRAMUSCULAR | Status: DC

## 2016-07-15 MED ORDER — ONDANSETRON HCL 4 MG PO TABS: 4.0000 mg | ORAL_TABLET | Freq: Three times a day (TID) | ORAL | 0 refills | Status: DC | PRN

## 2016-07-15 MED ORDER — FAMOTIDINE IN NACL 20-0.9 MG/50ML-% IV SOLN
20.0000 mg | Freq: Once | INTRAVENOUS | Status: AC
  Administered 2016-07-15: 20 mg via INTRAVENOUS
  Filled 2016-07-15: qty 50

## 2016-07-15 MED ORDER — GI COCKTAIL ~~LOC~~
30.0000 mL | Freq: Once | ORAL | Status: AC
  Administered 2016-07-15: 30 mL via ORAL
  Filled 2016-07-15: qty 30

## 2016-07-15 MED ORDER — ACETAMINOPHEN 500 MG PO TABS: 1000.0000 mg | ORAL_TABLET | Freq: Once | ORAL | Status: DC

## 2016-07-15 MED ORDER — SODIUM CHLORIDE 0.9 % IV BOLUS (SEPSIS)
1000.0000 mL | Freq: Once | INTRAVENOUS | Status: AC
  Administered 2016-07-15: 1000 mL via INTRAVENOUS

## 2016-07-15 NOTE — ED Notes (Signed)
Pt spoke with soc - up and ambulated to the bathroom with steady gait. Ate all of her sandwich tray.

## 2016-07-15 NOTE — ED Notes (Signed)
[employer] [facility] here for transport to group home

## 2016-07-15 NOTE — ED Provider Notes (Signed)
[facility] Emergency Department Provider Note  ____________________________________________  Time seen: Approximately 4:24 PM  I have reviewed the triage vital signs and the nursing notes.   HISTORY  Chief Complaint Nausea; Diarrhea; and Chest Pain   HPI [name] is a 51 y.o. female history of GERD, COPD, schizoaffective disorder, mild cognitive impairment who presents for evaluation of nausea, vomiting, diarrhea. Patient reports that she was doing well this morning. She had a large breakfast and after that started having chest pain that she describes as burning and sharp located in the center of her chest constant since breakfast, non radiating. She had a few episodes of nonbloody nonbilious emesis and watery diarrhea. She is also complaining of chills and body aches. She has also had a cough that has been dry. No shortness of breath, no abdominal pain, no fever.  Past Medical History:  Diagnosis Date  . Anxiety   . Asthma   . Borderline personality disorder   . Cerebral palsy (HCC)   . Depression   . GERD (gastroesophageal reflux disease)   . Mild cognitive impairment   . Schizoaffective disorder (HCC)   . Wound abscess     Patient Active Problem List   Diagnosis Date Noted  . Suicidal ideation 12/09/2015  . Schizoaffective disorder, bipolar type (HCC)   . Schizoaffective disorder (HCC) 06/17/2015  . 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 GERD (gastroesophageal reflux disease) 09/24/2014  . COPD (chronic obstructive pulmonary disease) (HCC) 09/24/2014  . Borderline personality disorder 09/24/2014  . Cerebral palsy (HCC) 08/31/2014    Past Surgical History:  Procedure Laterality Date  . CHOLECYSTECTOMY    . INCISION AND DRAINAGE ABSCESS Right 01/02/2015   Procedure: INCISION AND DRAINAGE ABSCESS;  Surgeon: Tiney Rouge III, MD;  Location: [facility] ORS;  Service: General;  Laterality: Right;  . TONSILLECTOMY      Prior to Admission  medications   Medication Sig Start Date End Date Taking? Authorizing Provider  amantadine (SYMMETREL) 100 MG capsule Take 1 capsule (100 mg total) by mouth 2 (two) times daily. Patient taking differently: Take 100 mg by mouth daily.  09/25/15 07/15/16 Yes Minna Antis, MD  aspirin EC 81 MG tablet Take 81 mg by mouth daily.   Yes Historical Provider, MD  clonazePAM (KLONOPIN) 0.5 MG tablet Take 0.5 mg by mouth 2 (two) times daily.    Yes Historical Provider, MD  divalproex (DEPAKOTE) 250 MG DR tablet Take 500 mg by mouth 2 (two) times daily.    Yes Historical Provider, MD  Fluticasone-Salmeterol (ADVAIR) 100-50 MCG/DOSE AEPB Inhale 1 puff into the lungs 2 (two) times daily.    Yes Historical Provider, MD  ibuprofen (ADVIL,MOTRIN) 200 MG tablet Take 200 mg by mouth every 6 (six) hours as needed for mild pain or moderate pain.    Yes Historical Provider, MD  loperamide (IMODIUM) 2 MG capsule Take 2 mg by mouth every 4 (four) hours.    Yes Historical Provider, MD  ondansetron (ZOFRAN-ODT) 4 MG disintegrating tablet Take 4 mg by mouth every 8 (eight) hours as needed for nausea or vomiting.   Yes Historical Provider, MD  pantoprazole (PROTONIX) 40 MG tablet Take 40 mg by mouth daily.    Yes Historical Provider, MD  risperiDONE (RISPERDAL) 3 MG tablet Take 1 tablet (3 mg total) by mouth at bedtime. 09/25/15  Yes Minna Antis, MD  escitalopram (LEXAPRO) 20 MG tablet Take 30 mg by mouth daily.    Historical Provider, MD  fenofibrate 160 MG  tablet Take 160 mg by mouth daily.    Historical Provider, MD  hydrOXYzine (ATARAX/VISTARIL) 50 MG tablet Take 50 mg by mouth at bedtime.     Historical Provider, MD  magic mouthwash w/lidocaine SOLN Take 10 mLs by mouth 4 (four) times daily as needed (throat pain). Note to pharmacy - equal parts diphendydramine, aluminum hydroxide and lidocaine HCL Patient not taking: Reported on 07/15/2016 04/15/16   Burgess Amor, PA-C  miconazole (MICOTIN) 2 % powder Apply 1  application topically as needed for itching.    Historical Provider, MD  ondansetron (ZOFRAN) 4 MG tablet Take 1 tablet (4 mg total) by mouth every 8 (eight) hours as needed for nausea or vomiting. 07/15/16   Nita Sickle, MD    Allergies Penicillins  Family History  Problem Relation Age of Onset  . Heart attack Father   . Diabetes Mother     Social History Social History  Substance Use Topics  . Smoking status: Never Smoker  . Smokeless tobacco: Never Used  . Alcohol use No    Review of Systems  Constitutional: Negative for fever. + chills and body aches Eyes: Negative for visual changes. ENT: Negative for sore throat. Neck: No neck pain  Cardiovascular: + chest pain. Respiratory: Negative for shortness of breath. Gastrointestinal: Negative for abdominal pain. +vomiting and diarrhea. Genitourinary: Negative for dysuria. Musculoskeletal: Negative for back pain. Skin: Negative for rash. Neurological: Negative for headaches, weakness or numbness. Psych: No SI or HI  ____________________________________________   PHYSICAL EXAM:  VITAL SIGNS: ED Triage Vitals  Enc Vitals Group     BP 07/15/16 1605 119/68     Pulse Rate 07/15/16 1605 81     Resp 07/15/16 1605 16     Temp 07/15/16 1605 98.8 F (37.1 C)     Temp src --      SpO2 07/15/16 1605 98 %     Weight 07/15/16 1605 202 lb (91.6 kg)     Height 07/15/16 1605  (1.6 m)     Head Circumference --      Peak Flow --      Pain Score 07/15/16 1604 10     Pain Loc --      Pain Edu? --      Excl. in GC? --     Constitutional: Alert and oriented. Well appearing and in no apparent distress. HEENT:      Head: Normocephalic and atraumatic.         Eyes: Conjunctivae are normal. Sclera is non-icteric. EOMI. PERRL      Mouth/Throat: Mucous membranes are moist.       Neck: Supple with no signs of meningismus. Cardiovascular: Regular rate and rhythm. No murmurs, gallops, or rubs. 2+ symmetrical distal pulses  are present in all extremities. No JVD. Tenderness to palpation over the center of her chest reproducible pain Respiratory: Normal respiratory effort. Lungs are clear to auscultation bilaterally. No wheezes, crackles, or rhonchi.  Gastrointestinal: Soft, non tender, and non distended with positive bowel sounds. No rebound or guarding. Genitourinary: No CVA tenderness. Musculoskeletal: Nontender with normal range of motion in all extremities. No edema, cyanosis, or erythema of extremities. Neurologic: Normal speech and language. Face is symmetric. Moving all extremities. No gross focal neurologic deficits are appreciated. Skin: Skin is warm, dry and intact. No rash noted. Psychiatric: Mood and affect are normal. Speech and behavior are normal.  ____________________________________________   LABS (all labs ordered are listed, but only abnormal results are displayed)  Labs  Reviewed  COMPREHENSIVE METABOLIC PANEL - Abnormal; Notable for the following:       Result Value   BUN 21 (*)    AST 45 (*)    All other components within normal limits  CBC  TROPONIN I  TROPONIN I   ____________________________________________  EKG  ED ECG REPORT I, Nita Sickle, the attending physician, personally viewed and interpreted this ECG.  Normal sinus rhythm, rate of 77, normal intervals, normal axis, no ST elevations or depressions, low voltage QRS. Unchanged from prior ____________________________________________  RADIOLOGY  CXR: negative  ____________________________________________   PROCEDURES  Procedure(s) performed: None Procedures Critical Care performed:  None ____________________________________________   INITIAL IMPRESSION / ASSESSMENT AND PLAN / ED COURSE  51 y.o. female history of GERD, COPD, schizoaffective disorder, mild cognitive impairment who presents for evaluation of nausea, vomiting, diarrhea, body aches, chills, and central Cp since breakfast this morning.  Patient has a history of GERD and pain started after she had a fairly large breakfast. Pain is also reproducible on palpation of her chest. Patient does not have any history of ischemic heart disease. Her EKG shows no evidence of ischemia. We'll give a GI cocktail, Pepcid, Zofran, fluids, cycle cardiac enzymes, get a chest x-ray and basic labs.  Clinical Course as of Jul 17 2154  Sat Jul 15, 2016  1708 Patient is now saying that her pain has resolved with the Pepcid and a GI cocktail. She is asking for same which and telling the nurse that she is now suicidal. Patient has had multiple visits here for suicidal ideation in the past. Will consult psychiatry to speak with her.  [CV]  1902 Patient evaluated by [facility] psychiatrist Dr. Maricela Bo who cleared patient for discharge back to group home. Patient's 2nd troponin is pending. Plan to dc back if 2nd trop is negative.  [CV]  2003 2nd trop is negative.Patient remains well appearing. No CP. Tolerating PO. No vomiting or diarrhea here. Will dc back to group home on zofran and supportive care.  Patient had BP of 77/61 documented on the chart in the setting of BP cuff being on the floor. Patient's BP has been stable during her entire visit.  [CV]    Clinical Course User Index [CV] Nita Sickle, MD    Pertinent labs & imaging results that were available during my care of the patient were reviewed by me and considered in my medical decision making (see chart for details).    ____________________________________________   FINAL CLINICAL IMPRESSION(S) / ED DIAGNOSES  Final diagnoses:  Viral gastroenteritis  Gastroesophageal reflux disease, esophagitis presence not specified      NEW MEDICATIONS STARTED DURING THIS VISIT:  Discharge Medication List as of 07/15/2016  8:03 PM       Note:  This document was prepared using Dragon voice recognition software and may include unintentional dictation errors.    Nita Sickle, MD 07/16/16  2156

## 2016-07-15 NOTE — ED Notes (Signed)
Pt keeps taking her bp cuff off

## 2016-07-15 NOTE — ED Notes (Signed)
Pt off the monitor and discharged. Awaiting cab home to group home.

## 2016-07-15 NOTE — ED Notes (Signed)
Pt states the medication made her feel better and believes a [place] sandwich will help her feeling of weakness. Now states she is having some suicidal thought and would like to stay here at the hospital so "we can take care of" her. When asked what she means states she is sick and needs a hospital. Soc consult in place after dr made aware.

## 2016-07-15 NOTE — ED Triage Notes (Signed)
Pt from [facility] [place] with c/o emesis x 1, diarrhea and chest pain. Ems gave 4 baby asa and 1 nitro which pt states made her feel worse so they held 2nd nitro. States ate eggs, danish and sausage for breakfast and felt bad after that.

## 2016-08-08 ENCOUNTER — Emergency Department
Admission: EM | Admit: 2016-08-08 | Discharge: 2016-08-10 | Disposition: A | Discharge status: Other Acute Inpatient Hospital | Payer: Medicare Other | Attending: Emergency Medicine | Admitting: Emergency Medicine

## 2016-08-08 ENCOUNTER — Encounter: Payer: Self-pay | Admitting: Emergency Medicine

## 2016-08-08 DIAGNOSIS — T391X2A Poisoning by 4-Aminophenol derivatives, intentional self-harm, initial encounter: Secondary | ICD-10-CM | POA: Diagnosis not present

## 2016-08-08 DIAGNOSIS — J45909 Unspecified asthma, uncomplicated: Secondary | ICD-10-CM | POA: Diagnosis not present

## 2016-08-08 DIAGNOSIS — K219 Gastro-esophageal reflux disease without esophagitis: Secondary | ICD-10-CM | POA: Diagnosis present

## 2016-08-08 DIAGNOSIS — T50902A Poisoning by unspecified drugs, medicaments and biological substances, intentional self-harm, initial encounter: Secondary | ICD-10-CM

## 2016-08-08 DIAGNOSIS — F70 Mild intellectual disabilities: Secondary | ICD-10-CM | POA: Diagnosis present

## 2016-08-08 DIAGNOSIS — G809 Cerebral palsy, unspecified: Secondary | ICD-10-CM | POA: Diagnosis present

## 2016-08-08 DIAGNOSIS — R45851 Suicidal ideations: Secondary | ICD-10-CM | POA: Diagnosis present

## 2016-08-08 DIAGNOSIS — T391X1A Poisoning by 4-Aminophenol derivatives, accidental (unintentional), initial encounter: Secondary | ICD-10-CM

## 2016-08-08 DIAGNOSIS — Z7982 Long term (current) use of aspirin: Secondary | ICD-10-CM | POA: Insufficient documentation

## 2016-08-08 DIAGNOSIS — F603 Borderline personality disorder: Secondary | ICD-10-CM | POA: Diagnosis present

## 2016-08-08 DIAGNOSIS — Z79899 Other long term (current) drug therapy: Secondary | ICD-10-CM | POA: Insufficient documentation

## 2016-08-08 LAB — URINE DRUG SCREEN, QUALITATIVE (ARMC ONLY)
AMPHETAMINES, UR SCREEN: NOT DETECTED
Barbiturates, Ur Screen: POSITIVE — AB
Benzodiazepine, Ur Scrn: NOT DETECTED
CANNABINOID 50 NG, UR ~~LOC~~: NOT DETECTED
COCAINE METABOLITE, UR ~~LOC~~: NOT DETECTED
MDMA (Ecstasy)Ur Screen: POSITIVE — AB
Methadone Scn, Ur: NOT DETECTED
OPIATE, UR SCREEN: NOT DETECTED
PHENCYCLIDINE (PCP) UR S: NOT DETECTED
Tricyclic, Ur Screen: NOT DETECTED

## 2016-08-08 LAB — CBC
HEMATOCRIT: 40.4 % (ref 35.0–47.0)
HEMOGLOBIN: 13.6 g/dL (ref 12.0–16.0)
MCH: 33.5 pg (ref 26.0–34.0)
MCHC: 33.7 g/dL (ref 32.0–36.0)
MCV: 99.3 fL (ref 80.0–100.0)
Platelets: 322 10*3/uL (ref 150–440)
RBC: 4.07 MIL/uL (ref 3.80–5.20)
RDW: 14.1 % (ref 11.5–14.5)
WBC: 7.1 10*3/uL (ref 3.6–11.0)

## 2016-08-08 LAB — COMPREHENSIVE METABOLIC PANEL
ALBUMIN: 3.8 g/dL (ref 3.5–5.0)
ALT: 38 U/L (ref 14–54)
AST: 60 U/L — AB (ref 15–41)
Alkaline Phosphatase: 54 U/L (ref 38–126)
Anion gap: 9 (ref 5–15)
BUN: 13 mg/dL (ref 6–20)
CHLORIDE: 103 mmol/L (ref 101–111)
CO2: 26 mmol/L (ref 22–32)
CREATININE: 1.08 mg/dL — AB (ref 0.44–1.00)
Calcium: 9.1 mg/dL (ref 8.9–10.3)
GFR calc Af Amer: >60 mL/min (ref >60)
GFR, EST NON AFRICAN AMERICAN: 59 mL/min — AB (ref >60)
GLUCOSE: 92 mg/dL (ref 65–99)
Potassium: 3.5 mmol/L (ref 3.5–5.1)
Sodium: 138 mmol/L (ref 135–145)
Total Bilirubin: 0.7 mg/dL (ref 0.3–1.2)
Total Protein: 7.2 g/dL (ref 6.5–8.1)

## 2016-08-08 LAB — SALICYLATE LEVEL: Salicylate Lvl: <7 mg/dL (ref 2.8–30.0)

## 2016-08-08 LAB — ACETAMINOPHEN LEVEL
Acetaminophen (Tylenol), Serum: <10 ug/mL — ABNORMAL LOW (ref 10–30)
Acetaminophen (Tylenol), Serum: 82 ug/mL — ABNORMAL HIGH (ref 10–30)

## 2016-08-08 LAB — ETHANOL

## 2016-08-08 NOTE — ED Notes (Signed)
BEHAVIORAL HEALTH ROUNDING Patient sleeping: No. Patient alert and oriented: yes Behavior appropriate: Yes.  ; If no, describe:  Nutrition and fluids offered: yes Toileting and hygiene offered: Yes  Sitter present: q15 minute observations and security  monitoring Law enforcement present: Yes  ODS  

## 2016-08-08 NOTE — ED Notes (Addendum)
Pt stated she "was going to overdose at her doctor office but the doctor is crazy. She told me if I opened the bottle she was going to call 911." Pt unable to urinate at this time, given specimen cup and walked back to [facility].

## 2016-08-08 NOTE — ED Notes (Signed)
BEHAVIORAL HEALTH ROUNDING  Patient sleeping: No.  Patient alert and oriented: yes  Behavior appropriate: Yes. ; If no, describe:  Nutrition and fluids offered: Yes  Toileting and hygiene offered: Yes  Sitter present: not applicable, Q 15 min safety rounds and observation via security camera. Law enforcement present: Yes ODS  

## 2016-08-08 NOTE — ED Notes (Signed)
ENVIRONMENTAL ASSESSMENT  Potentially harmful objects out of patient reach: Yes.  Personal belongings secured: Yes.  Patient dressed in hospital provided attire only: Yes.  Plastic bags out of patient reach: Yes.  Patient care equipment (cords, cables, call bells, lines, and drains) shortened, removed, or accounted for: Yes.  Equipment and supplies removed from bottom of stretcher: Yes.  Potentially toxic materials out of patient reach: Yes.  Sharps container removed or out of patient reach: Yes.   BEHAVIORAL HEALTH ROUNDING  Patient sleeping: No.  Patient alert and oriented: yes  Behavior appropriate: Yes. ; If no, describe:  Nutrition and fluids offered: Yes  Toileting and hygiene offered: Yes  Sitter present: not applicable, Q 15 min safety rounds and observation via security camera. Law enforcement present: Yes ODS  ED BHU PLACEMENT JUSTIFICATION  Is the patient under IVC or is there intent for IVC: No.  Is the patient medically cleared: Yes.  Is there vacancy in the ED BHU: Yes.  Is the population mix appropriate for patient: Yes.  Is the patient awaiting placement in inpatient or outpatient setting: Yes.  Has the patient had a psychiatric consult: Pending  Survey of unit performed for contraband, proper placement and condition of furniture, tampering with fixtures in bathroom, shower, and each patient room: Yes. ; Findings: All clear  APPEARANCE/BEHAVIOR  calm, cooperative and adequate rapport can be established  NEURO ASSESSMENT  Orientation: time, place and person  Hallucinations: No.None noted (Hallucinations)  Speech: Normal  Gait: normal  RESPIRATORY ASSESSMENT  WNL  CARDIOVASCULAR ASSESSMENT  WNL  GASTROINTESTINAL ASSESSMENT  WNL  EXTREMITIES  WNL  PLAN OF CARE  Provide calm/safe environment. Vital signs assessed TID. ED BHU Assessment once each 12-hour shift. Collaborate with TTS daily or as condition indicates. Assure the ED provider has rounded once each  shift. Provide and encourage hygiene. Provide redirection as needed. Assess for escalating behavior; address immediately and inform ED provider.  Assess family dynamic and appropriateness for visitation as needed: Yes. ; If necessary, describe findings:  Educate the patient/family about BHU procedures/visitation: Yes. ; If necessary, describe findings: Pt is calm and cooperative at this time. Pt understanding and accepting of unit procedures/rules. Will continue to monitor with Q 15 min safety rounds and observation via security camera.

## 2016-08-08 NOTE — ED Notes (Signed)

## 2016-08-08 NOTE — ED Notes (Signed)
Pt asking if Dr.Clapacs is here and stated "he will just send me home even though I tried to overdose."

## 2016-08-08 NOTE — Consult Note (Signed)
[facility] Face-to-Face Psychiatry Consult   Reason for Consult:  Consult for 51 year old woman with a history of chronic depression, carries a diagnosis of schizoaffective disorder, also history of personality disorder. She is claiming suicidal ideation and claiming she took an overdose Referring Physician:  Paduchowski [name] MRN:  [id number] Principal Diagnosis: Suicidal ideation Diagnosis:   Patient Active Problem List   Diagnosis Date Noted  . Overdose by acetaminophen [T39.1X1A] 08/08/2016  . Suicidal ideation [R45.851] 12/09/2015  . Schizoaffective disorder, bipolar type ([place]) [F25.0]   . Schizoaffective disorder ([place]) [F25.9] 06/17/2015  . 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 GERD (gastroesophageal reflux disease) [K21.9] 09/24/2014  . COPD (chronic obstructive pulmonary disease) ([place]) [J44.9] 09/24/2014  . Borderline personality disorder [F60.3] 09/24/2014  . Cerebral palsy ([place]) [G80.9] 08/31/2014    Total Time spent with patient: 45 minutes  Subjective:   [name] is a 51 y.o. female patient admitted with "I took an overdose".  HPI:  Patient interviewed chart reviewed. Case reviewed with emergency room physician and nursing. Patient well known from multiple prior encounters. 51 year old woman presents from her group home. Apparently she had brought herself to the emergency room initially from her therapist's office and was claiming to have suicidal ideation. Which she is now stating his that in the waiting room of the emergency room she went to the bathroom and swallowed 10 of her 500 mg acetaminophen pills. She then immediately told everyone that she had done it. Patient says that she does not care about living anymore because she is upset that her stepfather has bladder cancer and she is worried about her relationship with her "boyfriend". She says she has been medicine compliant. She denies any substance abuse. Denies any acute  hallucinations.  Medical history: Patient has cerebral palsy. Chronic gastric reflux symptoms. Otherwise in fairly stable medical health.  Substance abuse history: No history of alcohol or drug abuse  Social history: Patient has a legal guardian. She lives in a group home. She's been there chronically for quite a while. Frequently will act out especially when some kind of issue involving her family of origin arises.  Past Psychiatric History: Patient carries a diagnosis of schizoaffective disorder although I'm not sure I have ever seen her frankly psychotic. She has mild intellectual disability and cerebral palsy. Chronic poor impulse control and poor emotional control. Multiple presentations to the emergency room in the past claiming to be suicidal. Rarely ever acts on it. Frequently seems to be manipulative in an attempt to get into the hospital usually when she is frustrated at her group home.  Risk to Self: Is patient at risk for suicide?: Yes What has been your use of drugs/alcohol within the last 12 months?: Pt denies drug/alcohol use. How many times?: 3 Intentional Self Injurious Behavior: None Risk to Others: Does patient have access to weapons?: No Criminal Charges Pending?: No Does patient have a court date: No Prior Inpatient Therapy:   Prior Outpatient Therapy: Does patient have an ACCT team?: No Does patient have Intensive In-House Services?  : No Does patient have [facility] services? : No Does patient have P4CC services?: No  Past Medical History:  Past Medical History:  Diagnosis Date  . Anxiety   . Asthma   . Borderline personality disorder   . Cerebral palsy ([place])   . Depression   . GERD (gastroesophageal reflux disease)   . Mild cognitive impairment   . Schizoaffective disorder ([place])   . Wound abscess  Past Surgical History:  Procedure Laterality Date  . CHOLECYSTECTOMY    . INCISION AND DRAINAGE ABSCESS Right 01/02/2015   Procedure: INCISION AND DRAINAGE  ABSCESS;  Surgeon: Tiney Rouge III, MD;  Location: [facility] ORS;  Service: General;  Laterality: Right;  . TONSILLECTOMY     Family History:  Family History  Problem Relation Age of Onset  . Heart attack Father   . Diabetes Mother    Family Psychiatric  History: None known Social History:  History  Alcohol Use No     History  Drug Use No    Social History   Social History  . Marital status: Single    Spouse name: N/A  . Number of children: N/A  . Years of education: N/A   Social History Main Topics  . Smoking status: Never Smoker  . Smokeless tobacco: Never Used  . Alcohol use No  . Drug use: No  . Sexual activity: Yes    Birth control/ protection: Implant   Other Topics Concern  . None   Social History Narrative  . None   Additional Social History:    Allergies:   Allergies  Allergen Reactions  . Penicillins Other (See Comments)    Reaction:  Unknown  Has patient had a PCN reaction causing immediate rash, facial/tongue/throat swelling, SOB or lightheadedness with hypotension: Nounknown Has patient had a PCN reaction causing severe rash involving mucus membranes or skin necrosis: Nounknown Has patient had a PCN reaction that required hospitalization Nounknown Has patient had a PCN reaction occurring within the last 10 years: Nounknown If all of the above answers are "NO", then may proceed with Cephalosporin use.     Labs:  Results for orders placed or performed during the hospital encounter of 08/08/16 (from the past 48 hour(s))  Comprehensive metabolic panel     Status: Abnormal   Collection Time: 08/08/16  2:40 PM  Result Value Ref Range   Sodium 138 135 - 145 mmol/L   Potassium 3.5 3.5 - 5.1 mmol/L   Chloride 103 101 - 111 mmol/L   CO2 26 22 - 32 mmol/L   Glucose, Bld 92 65 - 99 mg/dL   BUN 13 6 - 20 mg/dL   Creatinine, Ser 1.61 (H) 0.44 - 1.00 mg/dL   Calcium 9.1 8.9 - 09.6 mg/dL   Total Protein 7.2 6.5 - 8.1 g/dL   Albumin 3.8 3.5 - 5.0 g/dL   AST  60 (H) 15 - 41 U/L   ALT 38 14 - 54 U/L   Alkaline Phosphatase 54 38 - 126 U/L   Total Bilirubin 0.7 0.3 - 1.2 mg/dL   GFR calc non Af Amer 59 (L) >60 mL/min   GFR calc Af Amer >60 >60 mL/min    Comment: (NOTE) The eGFR has been calculated using the CKD EPI equation. This calculation has not been validated in all clinical situations. eGFR's persistently <60 mL/min signify possible Chronic Kidney Disease.    Anion gap 9 5 - 15  Ethanol     Status: None   Collection Time: 08/08/16  2:40 PM  Result Value Ref Range   Alcohol, Ethyl (B) <5 <5 mg/dL    Comment:        LOWEST DETECTABLE LIMIT FOR SERUM ALCOHOL IS 5 mg/dL FOR MEDICAL PURPOSES ONLY   Salicylate level     Status: None   Collection Time: 08/08/16  2:40 PM  Result Value Ref Range   Salicylate Lvl <7.0 2.8 - 30.0 mg/dL  Acetaminophen  level     Status: Abnormal   Collection Time: 08/08/16  2:40 PM  Result Value Ref Range   Acetaminophen (Tylenol), Serum <10 (L) 10 - 30 ug/mL    Comment:        THERAPEUTIC CONCENTRATIONS VARY SIGNIFICANTLY. A RANGE OF 10-30 ug/mL MAY BE AN EFFECTIVE CONCENTRATION FOR MANY PATIENTS. HOWEVER, SOME ARE BEST TREATED AT CONCENTRATIONS OUTSIDE THIS RANGE. ACETAMINOPHEN CONCENTRATIONS >150 ug/mL AT 4 HOURS AFTER INGESTION AND >50 ug/mL AT 12 HOURS AFTER INGESTION ARE OFTEN ASSOCIATED WITH TOXIC REACTIONS.   cbc     Status: None   Collection Time: 08/08/16  2:40 PM  Result Value Ref Range   WBC 7.1 3.6 - 11.0 K/uL   RBC 4.07 3.80 - 5.20 MIL/uL   Hemoglobin 13.6 12.0 - 16.0 g/dL   HCT 40.4 35.0 - 47.0 %   MCV 99.3 80.0 - 100.0 fL   MCH 33.5 26.0 - 34.0 pg   MCHC 33.7 32.0 - 36.0 g/dL   RDW 14.1 11.5 - 14.5 %   Platelets 322 150 - 440 K/uL    No current facility-administered medications for this encounter.    Current Outpatient Prescriptions  Medication Sig Dispense Refill  . amantadine (SYMMETREL) 100 MG capsule Take 1 capsule (100 mg total) by mouth 2 (two) times daily.  (Patient taking differently: Take 100 mg by mouth daily. ) 60 capsule 0  . aspirin EC 81 MG tablet Take 81 mg by mouth daily.    . clonazePAM (KLONOPIN) 0.5 MG tablet Take 0.5 mg by mouth 2 (two) times daily.     . divalproex (DEPAKOTE) 250 MG DR tablet Take 500 mg by mouth 2 (two) times daily.     Marland Kitchen escitalopram (LEXAPRO) 20 MG tablet Take 30 mg by mouth daily.    . fenofibrate 160 MG tablet Take 160 mg by mouth daily.    . Fluticasone-Salmeterol (ADVAIR) 100-50 MCG/DOSE AEPB Inhale 1 puff into the lungs 2 (two) times daily.     . hydrOXYzine (ATARAX/VISTARIL) 50 MG tablet Take 50 mg by mouth at bedtime.     Marland Kitchen ibuprofen (ADVIL,MOTRIN) 200 MG tablet Take 200 mg by mouth every 6 (six) hours as needed for mild pain or moderate pain.     Marland Kitchen loperamide (IMODIUM) 2 MG capsule Take 2 mg by mouth every 4 (four) hours.     . magic mouthwash w/lidocaine SOLN Take 10 mLs by mouth 4 (four) times daily as needed (throat pain). Note to pharmacy - equal parts diphendydramine, aluminum hydroxide and lidocaine HCL (Patient not taking: Reported on 07/15/2016) 120 mL 0  . miconazole (MICOTIN) 2 % powder Apply 1 application topically as needed for itching.    . ondansetron (ZOFRAN) 4 MG tablet Take 1 tablet (4 mg total) by mouth every 8 (eight) hours as needed for nausea or vomiting. 20 tablet 0  . ondansetron (ZOFRAN-ODT) 4 MG disintegrating tablet Take 4 mg by mouth every 8 (eight) hours as needed for nausea or vomiting.    . pantoprazole (PROTONIX) 40 MG tablet Take 40 mg by mouth daily.     . risperiDONE (RISPERDAL) 3 MG tablet Take 1 tablet (3 mg total) by mouth at bedtime. 30 tablet 0    Musculoskeletal: Strength & Muscle Tone: decreased Gait & Station: normal Patient leans: N/A  Psychiatric Specialty Exam: Physical Exam  Nursing note and vitals reviewed. Constitutional: She appears well-developed and well-nourished.  HENT:  Head: Normocephalic and atraumatic.  Eyes: Conjunctivae are normal. Pupils are  equal, round, and reactive to light.  Neck: Normal range of motion.  Cardiovascular: Regular rhythm and normal heart sounds.   Respiratory: Effort normal. No respiratory distress.  GI: Soft.  Musculoskeletal: Normal range of motion.  Neurological: She is alert.  Skin: Skin is warm and dry.  Psychiatric: Her mood appears anxious. Her speech is delayed. She is slowed. Cognition and memory are impaired. She expresses impulsivity. She exhibits a depressed mood. She expresses suicidal ideation. She expresses suicidal plans.    Review of Systems  Constitutional: Negative.   HENT: Negative.   Eyes: Negative.   Respiratory: Negative.   Cardiovascular: Negative.   Gastrointestinal: Negative.   Musculoskeletal: Negative.   Skin: Negative.   Neurological: Negative.   Psychiatric/Behavioral: Positive for depression and suicidal ideas. Negative for hallucinations, memory loss and substance abuse. The patient is nervous/anxious. The patient does not have insomnia.     Blood pressure 116/66, pulse 91, temperature 98.4 F (36.9 C), temperature source Oral, resp. rate 16, height _0  (1.6 m), weight 91.6 kg (202 lb), SpO2 98 %.Body mass index is 35.78 kg/m.  General Appearance: Fairly Groomed  Eye Contact:  Fair  Speech:  Clear and Coherent  Volume:  Normal  Mood:  Depressed  Affect:  Blunt  Thought Process:  Goal Directed  Orientation:  Full (Time, Place, and Person)  Thought Content:  Illogical  Suicidal Thoughts:  Yes.  with intent/plan  Homicidal Thoughts:  No  Memory:  Immediate;   Fair Recent;   Fair Remote;   Fair  Judgement:  Impaired  Insight:  Shallow  Psychomotor Activity:  Normal  Concentration:  Concentration: Fair  Recall:  [employer] of Knowledge:  Fair  Language:  Fair  Akathisia:  No  Handed:  Right  AIMS (if indicated):     Assets:  Housing Physical Health Resilience Social Support  ADL's:  Intact  Cognition:  Impaired,  Mild  Sleep:        Treatment Plan  Summary: Daily contact with patient to assess and evaluate symptoms and progress in treatment, Medication management and Plan 51 year old woman is presenting through the emergency room now claiming that she took an overdose of acetaminophen. I pointed out to her the illogical nature of this behavior. If she actually wanted to die and makes no sense that she immediately told everyone here in the emergency room what she had done. Patient admits this is the case but says she simply wants to get some help. Her initial acetaminophen level is negative but of course we need to wait to see if she actually took any. Case reviewed with emergency room doctor. Follow-up acetaminophen level be checked at the appropriate time. Meanwhile psychoeducation and supportive counseling done with the patient. I will follow-up as needed. She is claiming that one of the things she is upset about is her relationship with a man she identifies as her boyfriend. She gave [place] his name and I happen to know that that individual is currently an inpatient on the psychiatric ward which is reason enough to preclude any admission of her but we will have to make that decision when the time comes. No other change to any medicine at this point.  Disposition: Supportive therapy provided about ongoing stressors.  Alethia Berthold, MD 08/08/2016 4:38 PM

## 2016-08-08 NOTE — ED Notes (Signed)
BEHAVIORAL HEALTH ROUNDING  Patient sleeping: No.  Patient alert and oriented: yes  Behavior appropriate: Yes. ; If no, describe:  Nutrition and fluids offered: Yes  Toileting and hygiene offered: Yes  Sitter present: not applicable, Q 15 min safety rounds and observation via security camera. Law enforcement present: Yes ODS  Pt up to the bathroom. No co's. Remains calm and cooperative.

## 2016-08-08 NOTE — ED Notes (Addendum)
Pt dressed out into appropriate behavioral health clothing. Pt belongings consist of a blue purse with white polka dots, a pink shirt, a pair of white shoes, a pair of purple socks, a white bra, a pair of blue jeans, a pair of blue panties and a watch with a brown band.
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
## 2016-08-08 NOTE — ED Triage Notes (Addendum)
Pt states she does not want to live anymore so she took 12 tylenol capsules right when she got here. States she went to the bathroom and took the pills. Pt denies any HI at this time.

## 2016-08-08 NOTE — ED Notes (Signed)
TTS is consulting at this time 

## 2016-08-08 NOTE — ED Notes (Signed)
Transfer from ED Quad. Per pt report, she is experiencing increased depression and anxiety. States she went to therapist and endorsed SI. She came to ED and reportedly took #12 acetaminophen in the ED bathroom upon presentation. She denies HI and AVH and contracts for safety. She does not appear to be in acute distress. Writer expressed concerns r/t lab results from presentation and timeframe subsequent to ingestion and labs. Pending 1900 labs for acetaminophen. Safety maintained with q15 minute checks, hourly rounding and ODS obs. Will continue current POC pending disposition

## 2016-08-08 NOTE — ED Provider Notes (Signed)
[facility] Emergency Department Provider Note  Time seen: 3:16 PM  I have reviewed the triage vital signs and the nursing notes.   HISTORY  Chief Complaint Suicidal and Ingestion    HPI [name] is a 51 y.o. female with a past medical history of cerebral palsy, gastric reflux, cognitive impairment, schizophrenia, presents to the emergency department after a suicide attempt. According to the patient she states she was at her doctor's office and was threatening to take pills to overdose, but she did not. She came to the emergency department and states she took 12 tablets of Tylenol in the waiting room and then checked in. Patient denies any medical complaints today. Denies nausea vomiting or diarrhea. Denies abdominal pain. Patient states she took the pills because she doesn't care about her life. States she came to the emergency department so that we could help her. Denies any alcohol use or any other substance use or any other ingestions.  Past Medical History:  Diagnosis Date  . Anxiety   . Asthma   . Borderline personality disorder   . Cerebral palsy (HCC)   . Depression   . GERD (gastroesophageal reflux disease)   . Mild cognitive impairment   . Schizoaffective disorder (HCC)   . Wound abscess     Patient Active Problem List   Diagnosis Date Noted  . Suicidal ideation 12/09/2015  . Schizoaffective disorder, bipolar type (HCC)   . Schizoaffective disorder (HCC) 06/17/2015  . 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 GERD (gastroesophageal reflux disease) 09/24/2014  . COPD (chronic obstructive pulmonary disease) (HCC) 09/24/2014  . Borderline personality disorder 09/24/2014  . Cerebral palsy (HCC) 08/31/2014    Past Surgical History:  Procedure Laterality Date  . CHOLECYSTECTOMY    . INCISION AND DRAINAGE ABSCESS Right 01/02/2015   Procedure: INCISION AND DRAINAGE ABSCESS;  Surgeon: Tiney Rouge III, MD;  Location: [facility] ORS;  Service:  General;  Laterality: Right;  . TONSILLECTOMY      Prior to Admission medications   Medication Sig Start Date End Date Taking? Authorizing Provider  amantadine (SYMMETREL) 100 MG capsule Take 1 capsule (100 mg total) by mouth 2 (two) times daily. Patient taking differently: Take 100 mg by mouth daily.  09/25/15 07/15/16  Minna Antis, MD  aspirin EC 81 MG tablet Take 81 mg by mouth daily.    Historical Provider, MD  clonazePAM (KLONOPIN) 0.5 MG tablet Take 0.5 mg by mouth 2 (two) times daily.     Historical Provider, MD  divalproex (DEPAKOTE) 250 MG DR tablet Take 500 mg by mouth 2 (two) times daily.     Historical Provider, MD  escitalopram (LEXAPRO) 20 MG tablet Take 30 mg by mouth daily.    Historical Provider, MD  fenofibrate 160 MG tablet Take 160 mg by mouth daily.    Historical Provider, MD  Fluticasone-Salmeterol (ADVAIR) 100-50 MCG/DOSE AEPB Inhale 1 puff into the lungs 2 (two) times daily.     Historical Provider, MD  hydrOXYzine (ATARAX/VISTARIL) 50 MG tablet Take 50 mg by mouth at bedtime.     Historical Provider, MD  ibuprofen (ADVIL,MOTRIN) 200 MG tablet Take 200 mg by mouth every 6 (six) hours as needed for mild pain or moderate pain.     Historical Provider, MD  loperamide (IMODIUM) 2 MG capsule Take 2 mg by mouth every 4 (four) hours.     Historical Provider, MD  magic mouthwash w/lidocaine SOLN Take 10 mLs by mouth 4 (four) times daily as needed (throat  pain). Note to pharmacy - equal parts diphendydramine, aluminum hydroxide and lidocaine HCL Patient not taking: Reported on 07/15/2016 04/15/16   Burgess Amor, PA-C  miconazole (MICOTIN) 2 % powder Apply 1 application topically as needed for itching.    Historical Provider, MD  ondansetron (ZOFRAN) 4 MG tablet Take 1 tablet (4 mg total) by mouth every 8 (eight) hours as needed for nausea or vomiting. 07/15/16   Nita Sickle, MD  ondansetron (ZOFRAN-ODT) 4 MG disintegrating tablet Take 4 mg by mouth every 8 (eight) hours as  needed for nausea or vomiting.    Historical Provider, MD  pantoprazole (PROTONIX) 40 MG tablet Take 40 mg by mouth daily.     Historical Provider, MD  risperiDONE (RISPERDAL) 3 MG tablet Take 1 tablet (3 mg total) by mouth at bedtime. 09/25/15   Minna Antis, MD    Allergies  Allergen Reactions  . Penicillins Other (See Comments)    Reaction:  Unknown  Has patient had a PCN reaction causing immediate rash, facial/tongue/throat swelling, SOB or lightheadedness with hypotension: Nounknown Has patient had a PCN reaction causing severe rash involving mucus membranes or skin necrosis: Nounknown Has patient had a PCN reaction that required hospitalization Nounknown Has patient had a PCN reaction occurring within the last 10 years: Nounknown If all of the above answers are "NO", then may proceed with Cephalosporin use.     Family History  Problem Relation Age of Onset  . Heart attack Father   . Diabetes Mother     Social History Social History  Substance Use Topics  . Smoking status: Never Smoker  . Smokeless tobacco: Never Used  . Alcohol use No    Review of Systems Constitutional: Negative for fever Cardiovascular: Negative for chest pain. Respiratory: Negative for shortness of breath. Gastrointestinal: Negative for abdominal pain Neurological: Negative for headache All other ROS negative  ____________________________________________   PHYSICAL EXAM:  VITAL SIGNS: ED Triage Vitals [08/08/16 1442]  Enc Vitals Group     BP 116/66     Pulse Rate 91     Resp 16     Temp 98.4 F (36.9 C)     Temp Source Oral     SpO2 98 %     Weight 202 lb (91.6 kg)     Height  (1.6 m)     Head Circumference      Peak Flow      Pain Score      Pain Loc      Pain Edu?      Excl. in GC?     Constitutional: Alert and oriented. Well appearing and in no distress. Eyes: Normal exam ENT   Head: Normocephalic and atraumatic.   Mouth/Throat: Mucous membranes are  moist. Cardiovascular: Normal rate, regular rhythm. No murmur Respiratory: Normal respiratory effort without tachypnea nor retractions. Breath sounds are clear Gastrointestinal: Soft and nontender. No distention Musculoskeletal: Nontender with normal range of motion in all extremities Neurologic:  Normal speech and language. No gross focal neurologic deficits  Skin:  Skin is warm, dry and intact.  Psychiatric: Normal apparent affect however patient states suicidal ideation with overdose with intent to kill herself.  ____________________________________________    EKG  EKG reviewed and interpreted by myself shows normal sinus rhythm 83 bpm, narrow QRS, normal axis, normal intervals, nonspecific ST changes. No ST elevation.  ____________________________________________  INITIAL IMPRESSION / ASSESSMENT AND PLAN / ED COURSE  Pertinent labs & imaging results that were available during my care of  the patient were reviewed by me and considered in my medical decision making (see chart for details).  The patient presents the emergency department after an overdose of Tylenol which occurred just prior to check in. We'll check labs including an acetaminophen level. We will recheck acetaminophen level at 7 PM. Overall patient appears well, no distress with a overall normal physical exam. Will IVC.  Patient's repeat acetaminophen level came back at 82 which should be approximate 4 hours after ingestion. This is still well below the toxic threshold based on the [employer]ack Matthews nomogram.  We will check a repeat acetaminophen level at 11 PM for an 8 hour level. ____________________________________________   FINAL CLINICAL IMPRESSION(S) / ED DIAGNOSES  Overdose   Minna Antis, MD 08/08/16 [phone]

## 2016-08-09 DIAGNOSIS — T391X2A Poisoning by 4-Aminophenol derivatives, intentional self-harm, initial encounter: Secondary | ICD-10-CM | POA: Diagnosis not present

## 2016-08-09 DIAGNOSIS — R45851 Suicidal ideations: Secondary | ICD-10-CM | POA: Diagnosis not present

## 2016-08-09 LAB — ACETAMINOPHEN LEVEL: Acetaminophen (Tylenol), Serum: 26 ug/mL (ref 10–30)

## 2016-08-09 MED ORDER — FENOFIBRATE 160 MG PO TABS
160.0000 mg | ORAL_TABLET | Freq: Every day | ORAL | Status: DC
  Administered 2016-08-10: 160 mg via ORAL
  Filled 2016-08-09 (×2): qty 1

## 2016-08-09 MED ORDER — SUCRALFATE 1 G PO TABS
1.0000 g | ORAL_TABLET | Freq: Three times a day (TID) | ORAL | Status: DC
  Administered 2016-08-09 – 2016-08-10 (×5): 1 g via ORAL
  Filled 2016-08-09 (×8): qty 1

## 2016-08-09 MED ORDER — RISPERIDONE 1 MG PO TABS
ORAL_TABLET | ORAL | Status: AC
  Administered 2016-08-09: 3 mg via ORAL
  Filled 2016-08-09: qty 3

## 2016-08-09 MED ORDER — DIVALPROEX SODIUM 500 MG PO DR TAB
500.0000 mg | DELAYED_RELEASE_TABLET | Freq: Two times a day (BID) | ORAL | Status: DC
  Administered 2016-08-10 (×2): 500 mg via ORAL
  Filled 2016-08-09 (×2): qty 1

## 2016-08-09 MED ORDER — ASPIRIN EC 81 MG PO TBEC
81.0000 mg | DELAYED_RELEASE_TABLET | Freq: Every day | ORAL | Status: DC
  Administered 2016-08-10: 81 mg via ORAL
  Filled 2016-08-09: qty 1

## 2016-08-09 MED ORDER — PANTOPRAZOLE SODIUM 40 MG PO TBEC
40.0000 mg | DELAYED_RELEASE_TABLET | Freq: Every day | ORAL | Status: DC
  Administered 2016-08-10: 40 mg via ORAL
  Filled 2016-08-09: qty 1

## 2016-08-09 MED ORDER — RISPERIDONE 3 MG PO TABS
3.0000 mg | ORAL_TABLET | Freq: Every day | ORAL | Status: DC
  Administered 2016-08-10: 3 mg via ORAL
  Filled 2016-08-09: qty 1

## 2016-08-09 MED ORDER — MOMETASONE FURO-FORMOTEROL FUM 100-5 MCG/ACT IN AERO
2.0000 | INHALATION_SPRAY | Freq: Two times a day (BID) | RESPIRATORY_TRACT | Status: DC
  Administered 2016-08-10 (×2): 2 via RESPIRATORY_TRACT
  Filled 2016-08-09 (×2): qty 8.8

## 2016-08-09 MED ORDER — AMANTADINE HCL 100 MG PO CAPS
100.0000 mg | ORAL_CAPSULE | Freq: Two times a day (BID) | ORAL | Status: DC
  Administered 2016-08-10 (×2): 100 mg via ORAL
  Filled 2016-08-09 (×5): qty 1

## 2016-08-09 MED ORDER — ESCITALOPRAM OXALATE 10 MG PO TABS
20.0000 mg | ORAL_TABLET | Freq: Every day | ORAL | Status: DC
  Administered 2016-08-10: 20 mg via ORAL
  Filled 2016-08-09: qty 2

## 2016-08-09 MED ORDER — RISPERIDONE 3 MG PO TABS
3.0000 mg | ORAL_TABLET | Freq: Once | ORAL | Status: AC
  Administered 2016-08-09: 3 mg via ORAL

## 2016-08-09 MED ORDER — CLONAZEPAM 0.5 MG PO TABS
0.5000 mg | ORAL_TABLET | Freq: Two times a day (BID) | ORAL | Status: DC
  Administered 2016-08-10 (×2): 0.5 mg via ORAL
  Filled 2016-08-09 (×2): qty 1

## 2016-08-09 NOTE — ED Notes (Signed)
Patient calm and cooperative.  Patient states, "If they send me back to that group home I'm going to overdose again."  Patient reports that she does not like being the only female at the group home.  She reports that her boyfriend (currently in the [facility]) states he is going to kill himself too.  She seems to boast about her overdose attempt not fully understanding the severity of her actions.  Patient appears cognitively limited.

## 2016-08-09 NOTE — ED Notes (Signed)
Patient ate 100% of breakfast and beverage.  

## 2016-08-09 NOTE — ED Notes (Signed)
Patient is awake, she is safe, states that she is hearing voices and has been seeing cats and dogs, and people, patient states that she did not sleep last night, nurse did reassure her that she was safe and was in a locked unit, q 15 minute checks and camera surveillance in progress.

## 2016-08-09 NOTE — ED Notes (Signed)
BEHAVIORAL HEALTH ROUNDING Patient sleeping: Yes.   Patient alert and oriented: not applicable SLEEPING Behavior appropriate: Yes.  ; If no, describe: SLEEPING Nutrition and fluids offered: No SLEEPING Toileting and hygiene offered: NoSLEEPING Sitter present: not applicable, Q 15 min safety rounds and observation via security camera. Law enforcement present: Yes ODS 

## 2016-08-09 NOTE — Consult Note (Signed)
[facility] Face-to-Face Psychiatry Consult   Reason for Consult:  Consult for 51 year old woman with a long history of mental health and behavior problems. Patient took an overdose of acetaminophen in the emergency room yesterday. Referring Physician:  Joni Fears [name] MRN:  [id number] Principal Diagnosis: Schizoaffective disorder [facility]) Diagnosis:   Patient Active Problem List   Diagnosis Date Noted  . Overdose by acetaminophen [T39.1X1A] 08/08/2016  . Suicidal ideation [R45.851] 12/09/2015  . Schizoaffective disorder, bipolar type ([place]) [F25.0]   . Schizoaffective disorder ([place]) [F25.9] 06/17/2015  . 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 GERD (gastroesophageal reflux disease) [K21.9] 09/24/2014  . COPD (chronic obstructive pulmonary disease) ([place]) [J44.9] 09/24/2014  . Borderline personality disorder [F60.3] 09/24/2014  . Cerebral palsy ([place]) [G80.9] 08/31/2014    Total Time spent with patient: 1 hour  Subjective:   [name] is a 51 y.o. female patient admitted with "don't send me back to that group home!".  HPI:  Patient interviewed chart reviewed. Patient is very familiar from multiple prior encounters. 51 year old woman came to the emergency room yesterday saying she was suicidal. She went to the bathroom when she got to the emergency room last night and actually took a handful of Tylenol while she was here in the emergency room. She then of course told everyone about it and was cooperative with the full evaluation. Patient never could make much logical sense of this. She tells me at one point that she came to the hospital because she had taken the overdose but then admits that she didn't actually take it until after she got here. It's pretty clear that all of this was just geared towards getting into the emergency room at Hospital and avoiding going back home. Patient complains as usual of hating her group home. Claims that people there are  mean to her and abusive to her. He claims to me that she plans to take another overdose and kill her self if she has to go back to her group home. Mood has been sad. Denies having any hallucinations. Denies any thoughts of harming anybody else.  Medical history: Patient has COPD gastric reflux symptoms elevated cholesterol overweight and a history of cerebral palsy  Substance abuse history: Doesn't drink doesn't abuse drugs no past history of substance abuse.  Social history: Patient is her own legal guardian. She lives in a group home though and she does not really have the wherewithal to make her own arrangements to get out of there. Her closest relatives are her mother and brother in [place].  Past Psychiatric History: Long history of multiple hospitalizations multiple visits to the emergency room. Patient has cerebral palsy and probably has some degree of developmental disability. She is certainly slow and limited in her thinking. A diagnosis of schizoaffective disorder is attached to her as well although I don't think I've ever seen her frankly psychotic. She does have a history of suicide attempts in the past. No history of violence.  Risk to Self: Suicidal Ideation: Yes-Currently Present Suicidal Intent: Yes-Currently Present Is patient at risk for suicide?: Yes Suicidal Plan?: Yes-Currently Present Specify Current Suicidal Plan: Pt reports intentional Tylenol OD while in ED wait room Access to Means: Yes Specify Access to Suicidal Means: Access to OTC medications What has been your use of drugs/alcohol within the last 12 months?: Pt denies drug/alcohol use. How many times?: 3 Intentional Self Injurious Behavior: None Risk to Others: Homicidal Ideation: No Thoughts of Harm to Others: Yes-Currently Present Comment - Thoughts  of Harm to Others: Pt reports thoughts of harming boyfriend. Pt denies intent/plan. Pt states she loves him too much to harm him. Current Homicidal Intent:  No Current Homicidal Plan: No Access to Homicidal Means: No History of harm to others?: No Assessment of Violence: None Noted Does patient have access to weapons?: No Criminal Charges Pending?: No Does patient have a court date: No Prior Inpatient Therapy: Prior Inpatient Therapy: Yes Prior Therapy Dates: 06/2015 Prior Therapy Facilty/Provider(s): [facility] Reason for Treatment: SI Prior Outpatient Therapy: Prior Outpatient Therapy: Yes Prior Therapy Dates: Ongoing Prior Therapy Facilty/Provider(s): Pt unable to recall Reason for Treatment: Not Reporte Does patient have an ACCT team?: No Does patient have Intensive In-House Services?  : No Does patient have [facility] services? : No Does patient have P4CC services?: No  Past Medical History:  Past Medical History:  Diagnosis Date  . Anxiety   . Asthma   . Borderline personality disorder   . Cerebral palsy ([place])   . Depression   . GERD (gastroesophageal reflux disease)   . Mild cognitive impairment   . Schizoaffective disorder ([place])   . Wound abscess     Past Surgical History:  Procedure Laterality Date  . CHOLECYSTECTOMY    . INCISION AND DRAINAGE ABSCESS Right 01/02/2015   Procedure: INCISION AND DRAINAGE ABSCESS;  Surgeon: Dia Crawford III, MD;  Location: [facility] ORS;  Service: General;  Laterality: Right;  . TONSILLECTOMY     Family History:  Family History  Problem Relation Age of Onset  . Heart attack Father   . Diabetes Mother    Family Psychiatric  History: None known Social History:  History  Alcohol Use No     History  Drug Use No    Social History   Social History  . Marital status: Single    Spouse name: N/A  . Number of children: N/A  . Years of education: N/A   Social History Main Topics  . Smoking status: Never Smoker  . Smokeless tobacco: Never Used  . Alcohol use No  . Drug use: No  . Sexual activity: Yes    Birth control/ protection: Implant   Other Topics Concern  . None   Social History  Narrative  . None   Additional Social History:    Allergies:   Allergies  Allergen Reactions  . Penicillins Other (See Comments)    Reaction:  Unknown  Has patient had a PCN reaction causing immediate rash, facial/tongue/throat swelling, SOB or lightheadedness with hypotension: Nounknown Has patient had a PCN reaction causing severe rash involving mucus membranes or skin necrosis: Nounknown Has patient had a PCN reaction that required hospitalization Nounknown Has patient had a PCN reaction occurring within the last 10 years: Nounknown If all of the above answers are "NO", then may proceed with Cephalosporin use.     Labs:  Results for orders placed or performed during the hospital encounter of 08/08/16 (from the past 48 hour(s))  Comprehensive metabolic panel     Status: Abnormal   Collection Time: 08/08/16  2:40 PM  Result Value Ref Range   Sodium 138 135 - 145 mmol/L   Potassium 3.5 3.5 - 5.1 mmol/L   Chloride 103 101 - 111 mmol/L   CO2 26 22 - 32 mmol/L   Glucose, Bld 92 65 - 99 mg/dL   BUN 13 6 - 20 mg/dL   Creatinine, Ser 1.08 (H) 0.44 - 1.00 mg/dL   Calcium 9.1 8.9 - 10.3 mg/dL   Total Protein  7.2 6.5 - 8.1 g/dL   Albumin 3.8 3.5 - 5.0 g/dL   AST 60 (H) 15 - 41 U/L   ALT 38 14 - 54 U/L   Alkaline Phosphatase 54 38 - 126 U/L   Total Bilirubin 0.7 0.3 - 1.2 mg/dL   GFR calc non Af Amer 59 (L) >60 mL/min   GFR calc Af Amer >60 >60 mL/min    Comment: (NOTE) The eGFR has been calculated using the CKD EPI equation. This calculation has not been validated in all clinical situations. eGFR's persistently <60 mL/min signify possible Chronic Kidney Disease.    Anion gap 9 5 - 15  Ethanol     Status: None   Collection Time: 08/08/16  2:40 PM  Result Value Ref Range   Alcohol, Ethyl (B) <5 <5 mg/dL    Comment:        LOWEST DETECTABLE LIMIT FOR SERUM ALCOHOL IS 5 mg/dL FOR MEDICAL PURPOSES ONLY   Salicylate level     Status: None   Collection Time: 08/08/16  2:40 PM   Result Value Ref Range   Salicylate Lvl <2.7 2.8 - 30.0 mg/dL  Acetaminophen level     Status: Abnormal   Collection Time: 08/08/16  2:40 PM  Result Value Ref Range   Acetaminophen (Tylenol), Serum <10 (L) 10 - 30 ug/mL    Comment:        THERAPEUTIC CONCENTRATIONS VARY SIGNIFICANTLY. A RANGE OF 10-30 ug/mL MAY BE AN EFFECTIVE CONCENTRATION FOR MANY PATIENTS. HOWEVER, SOME ARE BEST TREATED AT CONCENTRATIONS OUTSIDE THIS RANGE. ACETAMINOPHEN CONCENTRATIONS >150 ug/mL AT 4 HOURS AFTER INGESTION AND >50 ug/mL AT 12 HOURS AFTER INGESTION ARE OFTEN ASSOCIATED WITH TOXIC REACTIONS.   cbc     Status: None   Collection Time: 08/08/16  2:40 PM  Result Value Ref Range   WBC 7.1 3.6 - 11.0 K/uL   RBC 4.07 3.80 - 5.20 MIL/uL   Hemoglobin 13.6 12.0 - 16.0 g/dL   HCT 40.4 35.0 - 47.0 %   MCV 99.3 80.0 - 100.0 fL   MCH 33.5 26.0 - 34.0 pg   MCHC 33.7 32.0 - 36.0 g/dL   RDW 14.1 11.5 - 14.5 %   Platelets 322 150 - 440 K/uL  Urine Drug Screen, Qualitative     Status: Abnormal   Collection Time: 08/08/16  2:40 PM  Result Value Ref Range   Tricyclic, Ur Screen NONE DETECTED NONE DETECTED   Amphetamines, Ur Screen NONE DETECTED NONE DETECTED   MDMA (Ecstasy)Ur Screen POSITIVE (A) NONE DETECTED   Cocaine Metabolite,Ur [place] NONE DETECTED NONE DETECTED   Opiate, Ur Screen NONE DETECTED NONE DETECTED   Phencyclidine (PCP) Ur S NONE DETECTED NONE DETECTED   Cannabinoid 50 Ng, Ur [place] NONE DETECTED NONE DETECTED   Barbiturates, Ur Screen POSITIVE (A) NONE DETECTED   Benzodiazepine, Ur Scrn NONE DETECTED NONE DETECTED   Methadone Scn, Ur NONE DETECTED NONE DETECTED    Comment: (NOTE) 782  Tricyclics, urine               Cutoff 1000 ng/mL 200  Amphetamines, urine             Cutoff 1000 ng/mL 300  MDMA (Ecstasy), urine           Cutoff 500 ng/mL 400  Cocaine Metabolite, urine       Cutoff 300 ng/mL 500  Opiate, urine  Cutoff 300 ng/mL 600  Phencyclidine (PCP), urine      Cutoff  25 ng/mL 700  Cannabinoid, urine              Cutoff 50 ng/mL 800  Barbiturates, urine             Cutoff 200 ng/mL 900  Benzodiazepine, urine           Cutoff 200 ng/mL 1000 Methadone, urine                Cutoff 300 ng/mL 1100 1200 The urine drug screen provides only a preliminary, unconfirmed 1300 analytical test result and should not be used for non-medical 1400 purposes. Clinical consideration and professional judgment should 1500 be applied to any positive drug screen result due to possible 1600 interfering substances. A more specific alternate chemical method 1700 must be used in order to obtain a confirmed analytical result.  1800 Gas chromato graphy / mass spectrometry (GC/MS) is the preferred 1900 confirmatory method.   Acetaminophen level     Status: Abnormal   Collection Time: 08/08/16  6:48 PM  Result Value Ref Range   Acetaminophen (Tylenol), Serum 82 (H) 10 - 30 ug/mL    Comment:        THERAPEUTIC CONCENTRATIONS VARY SIGNIFICANTLY. A RANGE OF 10-30 ug/mL MAY BE AN EFFECTIVE CONCENTRATION FOR MANY PATIENTS. HOWEVER, SOME ARE BEST TREATED AT CONCENTRATIONS OUTSIDE THIS RANGE. ACETAMINOPHEN CONCENTRATIONS >150 ug/mL AT 4 HOURS AFTER INGESTION AND >50 ug/mL AT 12 HOURS AFTER INGESTION ARE OFTEN ASSOCIATED WITH TOXIC REACTIONS.   Acetaminophen level     Status: None   Collection Time: 08/08/16 10:51 PM  Result Value Ref Range   Acetaminophen (Tylenol), Serum 26 10 - 30 ug/mL    Comment:        THERAPEUTIC CONCENTRATIONS VARY SIGNIFICANTLY. A RANGE OF 10-30 ug/mL MAY BE AN EFFECTIVE CONCENTRATION FOR MANY PATIENTS. HOWEVER, SOME ARE BEST TREATED AT CONCENTRATIONS OUTSIDE THIS RANGE. ACETAMINOPHEN CONCENTRATIONS >150 ug/mL AT 4 HOURS AFTER INGESTION AND >50 ug/mL AT 12 HOURS AFTER INGESTION ARE OFTEN ASSOCIATED WITH TOXIC REACTIONS.     Current Facility-Administered Medications  Medication Dose Route Frequency Provider Last Rate Last Dose  .  amantadine (SYMMETREL) capsule 100 mg  100 mg Oral BID Gonzella Lex, MD      . aspirin EC tablet 81 mg  81 mg Oral Daily Gonzella Lex, MD      . clonazePAM (KLONOPIN) tablet 0.5 mg  0.5 mg Oral BID Gonzella Lex, MD      . divalproex (DEPAKOTE) DR tablet 500 mg  500 mg Oral Q12H Daxtin Leiker T Liat Mayol, MD      . escitalopram (LEXAPRO) tablet 20 mg  20 mg Oral Daily Danahi Reddish T Savannaha Stonerock, MD      . fenofibrate tablet 160 mg  160 mg Oral Daily Teva Bronkema T Yarixa Lightcap, MD      . mometasone-formoterol (DULERA) 100-5 MCG/ACT inhaler 2 puff  2 puff Inhalation BID Gonzella Lex, MD      . pantoprazole (PROTONIX) EC tablet 40 mg  40 mg Oral Daily Amedeo Detweiler T Armenta Erskin, MD      . risperiDONE (RISPERDAL) tablet 3 mg  3 mg Oral QHS Tyress Loden T Fae Blossom, MD      . sucralfate (CARAFATE) tablet 1 g  1 g Oral TID WC & HS Gonzella Lex, MD       Current Outpatient Prescriptions  Medication Sig Dispense Refill  . acetaminophen (TYLENOL) 500 MG  tablet Take 500 mg by mouth every 6 (six) hours as needed.    Marland Kitchen amantadine (SYMMETREL) 100 MG capsule Take 1 capsule (100 mg total) by mouth 2 (two) times daily. (Patient taking differently: Take 100 mg by mouth daily. ) 60 capsule 0  . aspirin EC 81 MG tablet Take 81 mg by mouth daily.    . clonazePAM (KLONOPIN) 0.5 MG tablet Take 0.5 mg by mouth 2 (two) times daily.     Marland Kitchen dicyclomine (BENTYL) 20 MG tablet Take 20 mg by mouth 4 (four) times daily -  before meals and at bedtime.    . divalproex (DEPAKOTE) 250 MG DR tablet Take 500 mg by mouth 2 (two) times daily.     Marland Kitchen escitalopram (LEXAPRO) 20 MG tablet Take 30 mg by mouth daily.    . fenofibrate 160 MG tablet Take 160 mg by mouth daily.    . Fluticasone-Salmeterol (ADVAIR) 100-50 MCG/DOSE AEPB Inhale 1 puff into the lungs 2 (two) times daily.     . hydrOXYzine (ATARAX/VISTARIL) 50 MG tablet Take 50 mg by mouth at bedtime.     Marland Kitchen loperamide (IMODIUM) 2 MG capsule Take 2 mg by mouth every 4 (four) hours.     . magic mouthwash w/lidocaine SOLN Take 10  mLs by mouth 4 (four) times daily as needed (throat pain). Note to pharmacy - equal parts diphendydramine, aluminum hydroxide and lidocaine HCL 120 mL 0  . meloxicam (MOBIC) 15 MG tablet Take 15 mg by mouth daily.    . ondansetron (ZOFRAN) 4 MG tablet Take 1 tablet (4 mg total) by mouth every 8 (eight) hours as needed for nausea or vomiting. 20 tablet 0  . pantoprazole (PROTONIX) 40 MG tablet Take 40 mg by mouth daily.     . QUEtiapine (SEROQUEL) 25 MG tablet Take 25 mg by mouth 4 (four) times daily as needed. Take 25 mg by mouth 4 (four) times daily as needed for restlessness.    . risperiDONE (RISPERDAL) 3 MG tablet Take 1 tablet (3 mg total) by mouth at bedtime. 30 tablet 0  . sucralfate (CARAFATE) 1 g tablet Take 1 g by mouth 4 (four) times daily -  with meals and at bedtime.      Musculoskeletal: Strength & Muscle Tone: within normal limits Gait & Station: normal Patient leans: N/A  Psychiatric Specialty Exam: Physical Exam  Nursing note and vitals reviewed. Constitutional: She appears well-developed and well-nourished.  HENT:  Head: Normocephalic and atraumatic.  Eyes: Conjunctivae are normal. Pupils are equal, round, and reactive to light.  Neck: Normal range of motion.  Cardiovascular: Regular rhythm and normal heart sounds.   Respiratory: Effort normal. No respiratory distress.  GI: Soft.  Musculoskeletal: Normal range of motion.  Neurological: She is alert.  Patient has either some degree of tardive dyskinesia causing rocking or just rocks herself continuously to soothe herself. Usually able to walk without any clear involuntary movements.  Skin: Skin is warm and dry.  Psychiatric: Her speech is delayed and tangential. She is slowed. She expresses impulsivity. She exhibits a depressed mood. She expresses suicidal ideation. She expresses suicidal plans. She exhibits abnormal recent memory.    Review of Systems  Constitutional: Negative.   HENT: Negative.   Eyes: Negative.    Respiratory: Negative.   Cardiovascular: Negative.   Gastrointestinal: Negative.   Musculoskeletal: Negative.   Skin: Negative.   Neurological: Negative.   Psychiatric/Behavioral: Positive for depression and suicidal ideas. Negative for hallucinations, memory loss and substance abuse.  The patient is not nervous/anxious and does not have insomnia.     Blood pressure 112/70, pulse 69, temperature 97.7 F (36.5 C), temperature source Oral, resp. rate 18, height _0  (1.6 m), weight 91.6 kg (202 lb), SpO2 99 %.Body mass index is 35.78 kg/m.  General Appearance: Fairly Groomed  Eye Contact:  Fair  Speech:  Slow  Volume:  Decreased  Mood:  Depressed and Dysphoric  Affect:  Depressed and Tearful  Thought Process:  Goal Directed  Orientation:  Full (Time, Place, and Person)  Thought Content:  Illogical, Paranoid Ideation and Rumination  Suicidal Thoughts:  Yes.  with intent/plan  Homicidal Thoughts:  No  Memory:  Immediate;   Good Recent;   Fair Remote;   Fair  Judgement:  Impaired  Insight:  Shallow  Psychomotor Activity:  Restlessness and TD  Concentration:  Concentration: Poor  Recall:  Poor  Fund of Knowledge:  Fair  Language:  Fair  Akathisia:  No  Handed:  Right  AIMS (if indicated):     Assets:  Desire for Improvement Financial Resources/Insurance Housing Resilience  ADL's:  Intact  Cognition:  Impaired,  Mild and Moderate  Sleep:        Treatment Plan Summary: Daily contact with patient to assess and evaluate symptoms and progress in treatment, Medication management and Plan Restarted her outpatient medicine. 51 year old woman did take an overdose of Tylenol. Serial acetaminophen levels showed a peak as far as was measured at 80, which is below the really dangerous realm and then it came down. This suggests that she probably took much less than what she claims she did. Nevertheless it does show that she actually took some pills and there is some dangerousness afoot  here. Patient probably would be at high risk for dangerous behavior if we sent her back home. Recommend hospitalization however the other client she identifies as her boyfriend is currently an inpatient here. Based on this I have requested TTS to refer her out to other facilities.  Disposition: Recommend psychiatric Inpatient admission when medically cleared. Supportive therapy provided about ongoing stressors.  Alethia Berthold, MD 08/09/2016 2:42 PM

## 2016-08-09 NOTE — BHH Counselor (Signed)
Referral information for Psychiatric Hospitalization faxed to;    [facility] [phone]),    [facility] [phone])   [facility] [phone]),    [facility] [phone]),    [facility] [phone])   [facility] [phone])   [facility] [phone])

## 2016-08-09 NOTE — ED Notes (Signed)
Patient ate 100% of lunch and beverage. Patient without any complications, will continue to monitor. q 15 minute checks and camera surveillance in progress.

## 2016-08-09 NOTE — ED Notes (Signed)
PT IVC/ PENDING PLACEMENT  

## 2016-08-09 NOTE — ED Notes (Signed)
BEHAVIORAL HEALTH ROUNDING  Patient sleeping: No.  Patient alert and oriented: yes  Behavior appropriate: Yes. ; If no, describe:  Nutrition and fluids offered: Yes  Toileting and hygiene offered: Yes  Sitter present: not applicable, Q 15 min safety rounds and observation via security camera. Law enforcement present: Yes ODS  

## 2016-08-09 NOTE — ED Notes (Signed)
Pt up to bathroom. Returned to bed. No co's voiced. Will continue to monitor.

## 2016-08-09 NOTE — ED Notes (Signed)
Patient sitting in dayroom, she states that " I cannot go back to that group home, I do not feel safe there, I am the only woman, I will kill myself, I don't care about myself, I don't even care if I suffer' Nurse listened and encouraged her to talk to the doctor, Patient is safe, q 15 minute checks and camera surveillance in progress.

## 2016-08-09 NOTE — ED Notes (Addendum)
Patient ate 100% of supper and beverage, Patient states that she is hearing voices at times, and that she still has SI, states ' I cannot go back to that group home" Nurse also ask her about if she ever does any drugs since drug screen showed ecstacy and she states ' No I don't drink or do drugs, I just took too many tylenol" Patient is cooperative and calm, q 15 minute check and camera surveillance in progress.

## 2016-08-09 NOTE — ED Provider Notes (Signed)
-----------------------------------------   7:06 AM on 08/09/2016 -----------------------------------------   Blood pressure 112/70, pulse 69, temperature 97.7 F (36.5 C), temperature source Oral, resp. rate 18, height  (1.6 m), weight 202 lb (91.6 kg), SpO2 99 %.  The patient had no acute events since last update.  Calm and cooperative at this time.  Disposition is pending Psychiatry/Behavioral Medicine team recommendations.  Medically cleared prior physician, Bobetta Lime patient did not require intervention for her Tylenol overdose. Patient was comfortable overnight, seemed to be complaining of some visual hallucinations and received Versed/. AWAITING PSYCHIATRIC INPUT.   Jeanmarie Plant, MD 08/09/16 [phone]

## 2016-08-09 NOTE — ED Notes (Addendum)
Patient came to nurses station complaining of AVH. EDP made aware. Verbal order for 3 mg Risperdal received.

## 2016-08-09 NOTE — ED Notes (Signed)
Patient is taking a shower at this time. 

## 2016-08-10 ENCOUNTER — Inpatient Hospital Stay
Admission: RE | Admit: 2016-08-10 | Discharge: 2016-08-14 | DRG: 885 | Disposition: A | Discharge status: Home / Self Care | Payer: Medicare Other | Source: Intra-hospital | Attending: Psychiatry | Admitting: Psychiatry

## 2016-08-10 ENCOUNTER — Encounter: Payer: Self-pay | Admitting: *Deleted

## 2016-08-10 DIAGNOSIS — Z79899 Other long term (current) drug therapy: Secondary | ICD-10-CM | POA: Diagnosis not present

## 2016-08-10 DIAGNOSIS — F419 Anxiety disorder, unspecified: Secondary | ICD-10-CM | POA: Diagnosis present

## 2016-08-10 DIAGNOSIS — Y92238 Other place in hospital as the place of occurrence of the external cause: Secondary | ICD-10-CM | POA: Diagnosis present

## 2016-08-10 DIAGNOSIS — F70 Mild intellectual disabilities: Secondary | ICD-10-CM | POA: Diagnosis present

## 2016-08-10 DIAGNOSIS — G4700 Insomnia, unspecified: Secondary | ICD-10-CM | POA: Diagnosis present

## 2016-08-10 DIAGNOSIS — Z791 Long term (current) use of non-steroidal anti-inflammatories (NSAID): Secondary | ICD-10-CM

## 2016-08-10 DIAGNOSIS — G809 Cerebral palsy, unspecified: Secondary | ICD-10-CM | POA: Diagnosis present

## 2016-08-10 DIAGNOSIS — F603 Borderline personality disorder: Secondary | ICD-10-CM | POA: Diagnosis present

## 2016-08-10 DIAGNOSIS — Z915 Personal history of self-harm: Secondary | ICD-10-CM

## 2016-08-10 DIAGNOSIS — F250 Schizoaffective disorder, bipolar type: Secondary | ICD-10-CM

## 2016-08-10 DIAGNOSIS — F17210 Nicotine dependence, cigarettes, uncomplicated: Secondary | ICD-10-CM | POA: Diagnosis present

## 2016-08-10 DIAGNOSIS — Z9049 Acquired absence of other specified parts of digestive tract: Secondary | ICD-10-CM | POA: Diagnosis not present

## 2016-08-10 DIAGNOSIS — Z833 Family history of diabetes mellitus: Secondary | ICD-10-CM | POA: Diagnosis not present

## 2016-08-10 DIAGNOSIS — T391X1A Poisoning by 4-Aminophenol derivatives, accidental (unintentional), initial encounter: Secondary | ICD-10-CM | POA: Diagnosis present

## 2016-08-10 DIAGNOSIS — Z8249 Family history of ischemic heart disease and other diseases of the circulatory system: Secondary | ICD-10-CM

## 2016-08-10 DIAGNOSIS — Z880 Allergy status to penicillin: Secondary | ICD-10-CM | POA: Diagnosis not present

## 2016-08-10 DIAGNOSIS — I10 Essential (primary) hypertension: Secondary | ICD-10-CM | POA: Diagnosis present

## 2016-08-10 DIAGNOSIS — J449 Chronic obstructive pulmonary disease, unspecified: Secondary | ICD-10-CM | POA: Diagnosis present

## 2016-08-10 DIAGNOSIS — E663 Overweight: Secondary | ICD-10-CM | POA: Diagnosis present

## 2016-08-10 DIAGNOSIS — Z7982 Long term (current) use of aspirin: Secondary | ICD-10-CM | POA: Diagnosis not present

## 2016-08-10 DIAGNOSIS — T391X2A Poisoning by 4-Aminophenol derivatives, intentional self-harm, initial encounter: Secondary | ICD-10-CM | POA: Diagnosis present

## 2016-08-10 DIAGNOSIS — Z6834 Body mass index (BMI) 34.0-34.9, adult: Secondary | ICD-10-CM

## 2016-08-10 DIAGNOSIS — Z62898 Other specified problems related to upbringing: Secondary | ICD-10-CM | POA: Diagnosis present

## 2016-08-10 DIAGNOSIS — K219 Gastro-esophageal reflux disease without esophagitis: Secondary | ICD-10-CM | POA: Diagnosis present

## 2016-08-10 DIAGNOSIS — E785 Hyperlipidemia, unspecified: Secondary | ICD-10-CM | POA: Diagnosis present

## 2016-08-10 DIAGNOSIS — R45851 Suicidal ideations: Secondary | ICD-10-CM | POA: Diagnosis present

## 2016-08-10 DIAGNOSIS — Z7951 Long term (current) use of inhaled steroids: Secondary | ICD-10-CM | POA: Diagnosis not present

## 2016-08-10 DIAGNOSIS — E7800 Pure hypercholesterolemia, unspecified: Secondary | ICD-10-CM | POA: Diagnosis present

## 2016-08-10 DIAGNOSIS — F332 Major depressive disorder, recurrent severe without psychotic features: Principal | ICD-10-CM | POA: Diagnosis present

## 2016-08-10 MED ORDER — FENOFIBRATE 160 MG PO TABS
160.0000 mg | ORAL_TABLET | Freq: Every day | ORAL | Status: DC
  Administered 2016-08-11 – 2016-08-14 (×4): 160 mg via ORAL
  Filled 2016-08-10 (×4): qty 1

## 2016-08-10 MED ORDER — ASPIRIN EC 81 MG PO TBEC
81.0000 mg | DELAYED_RELEASE_TABLET | Freq: Every day | ORAL | Status: DC
  Administered 2016-08-11 – 2016-08-14 (×4): 81 mg via ORAL
  Filled 2016-08-10 (×4): qty 1

## 2016-08-10 MED ORDER — PANTOPRAZOLE SODIUM 40 MG PO TBEC
40.0000 mg | DELAYED_RELEASE_TABLET | Freq: Every day | ORAL | Status: DC
  Administered 2016-08-11 – 2016-08-14 (×4): 40 mg via ORAL
  Filled 2016-08-10 (×4): qty 1

## 2016-08-10 MED ORDER — ESCITALOPRAM OXALATE 10 MG PO TABS
20.0000 mg | ORAL_TABLET | Freq: Every day | ORAL | Status: DC
  Administered 2016-08-11 – 2016-08-14 (×4): 20 mg via ORAL
  Filled 2016-08-10 (×4): qty 2

## 2016-08-10 MED ORDER — SUCRALFATE 1 G PO TABS
1.0000 g | ORAL_TABLET | Freq: Three times a day (TID) | ORAL | Status: DC
  Administered 2016-08-10 – 2016-08-14 (×15): 1 g via ORAL
  Filled 2016-08-10 (×16): qty 1

## 2016-08-10 MED ORDER — RISPERIDONE 3 MG PO TABS
3.0000 mg | ORAL_TABLET | Freq: Every day | ORAL | Status: DC
  Administered 2016-08-10 – 2016-08-13 (×4): 3 mg via ORAL
  Filled 2016-08-10 (×4): qty 1

## 2016-08-10 MED ORDER — CLONAZEPAM 0.5 MG PO TABS
0.5000 mg | ORAL_TABLET | Freq: Two times a day (BID) | ORAL | Status: DC
  Administered 2016-08-10 – 2016-08-14 (×9): 0.5 mg via ORAL
  Filled 2016-08-10 (×9): qty 1

## 2016-08-10 MED ORDER — MOMETASONE FURO-FORMOTEROL FUM 100-5 MCG/ACT IN AERO
2.0000 | INHALATION_SPRAY | Freq: Two times a day (BID) | RESPIRATORY_TRACT | Status: DC
Start: 2016-08-10 — End: 2016-08-14
  Administered 2016-08-10 – 2016-08-14 (×8): 2 via RESPIRATORY_TRACT
  Filled 2016-08-10: qty 8.8

## 2016-08-10 MED ORDER — DIVALPROEX SODIUM 500 MG PO DR TAB
500.0000 mg | DELAYED_RELEASE_TABLET | Freq: Two times a day (BID) | ORAL | Status: DC
  Administered 2016-08-10 – 2016-08-14 (×8): 500 mg via ORAL
  Filled 2016-08-10 (×8): qty 1

## 2016-08-10 MED ORDER — AMANTADINE HCL 100 MG PO CAPS
100.0000 mg | ORAL_CAPSULE | Freq: Two times a day (BID) | ORAL | Status: DC
  Administered 2016-08-10 – 2016-08-14 (×9): 100 mg via ORAL
  Filled 2016-08-10 (×9): qty 1

## 2016-08-10 NOTE — Progress Notes (Signed)
Patient received on unit under IVC from ED.  Patient in scrubs with notable body odor.  Flat affect and tearful.  States that she is here because she OD on some pills.  States "I just don't want to be here anymore"  Further states that she wants a new group home because she is the only female at this group home.   Body search and skin assessment performed.  No contraband found.  Skin warm and dry to touch, no broken areas noted.

## 2016-08-10 NOTE — ED Notes (Signed)
Belongings sent to inpatient unit.

## 2016-08-10 NOTE — Tx Team (Signed)
Initial Treatment Plan 08/10/2016 7:06 PM [name] NGE:[id number]    PATIENT STRESSORS: Other: does not like where she lives, lives in group home and she is only female   PATIENT STRENGTHS: Active sense of humor Motivation for treatment/growth   PATIENT IDENTIFIED PROBLEMS: "I want a new group home"  Coping skills                   DISCHARGE CRITERIA:  Improved stabilization in mood, thinking, and/or behavior Reduction of life-threatening or endangering symptoms to within safe limits  PRELIMINARY DISCHARGE PLAN: Return to previous living arrangement  PATIENT/FAMILY INVOLVEMENT: This treatment plan has been presented to and reviewed with the patient, [name], and/or family member, .  The patient and family have been given the opportunity to ask questions and make suggestions.  Elige Radon, RN 08/10/2016, 7:06 PM

## 2016-08-10 NOTE — ED Notes (Signed)
Patient is to be admitted to [facility] by Dr. Toni Amend.  Attending Physician will be Dr. Ardyth Harps.   Patient has been assigned to room 319, by [facility] Charge Nurse Gwen.   Intake Paper Work has been signed and placed on patient chart.  ER staff is aware of the admission [facility] Sect.; Dr. Silverio Lay, ER MD; Minerva Areola Patient's Nurse & Mia Patient Access).

## 2016-08-10 NOTE — ED Notes (Signed)
Pt to be transferred to [facility] for continuation of specialized care. She is aware of pending transfer and is agreeable. She offers no concerns and does appear to be stable for transport

## 2016-08-10 NOTE — Progress Notes (Signed)
Patient ID: [name], female   DOB: 07/05/1965, 51 y.o.   MRN: [id number] PER STATE REGULATIONS 482.30  THIS CHART WAS REVIEWED FOR MEDICAL NECESSITY WITH RESPECT TO THE PATIENT'S ADMISSION/DURATION OF STAY.  NEXT REVIEW DATE:08/14/16  Loura Halt, RN, BSN CASE MANAGER
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
## 2016-08-10 NOTE — ED Notes (Signed)
Sitting in bed on approach. Appears flat and depressed. No acute distress noted. Continues to endorse SI with no imminent plan and verbally contracts for safety. Endorses VH, but denies HI and AH. Continues to state she feels like she needs inpatient treatment and would be suicidal if D/C'd. Support and encouragement provided. Taking meds as ordered. Otherwise she offered no questions or concerns. Denied pain/discomfort. Safety has been maintained with q15 minute checks, hourly rounding and ODS obs. Will continue current POC pending disposition.

## 2016-08-11 ENCOUNTER — Encounter: Payer: Self-pay | Admitting: Psychiatry

## 2016-08-11 DIAGNOSIS — F332 Major depressive disorder, recurrent severe without psychotic features: Principal | ICD-10-CM

## 2016-08-11 LAB — LIPID PANEL
CHOLESTEROL: 178 mg/dL (ref 0–200)
HDL: 13 mg/dL — AB (ref >40)
LDL Cholesterol: UNDETERMINED mg/dL (ref 0–99)
TRIGLYCERIDES: 402 mg/dL — AB (ref <150)
Total CHOL/HDL Ratio: 13.7 RATIO
VLDL: UNDETERMINED mg/dL (ref 0–40)

## 2016-08-11 LAB — TSH: TSH: 12.608 u[IU]/mL — ABNORMAL HIGH (ref 0.350–4.500)

## 2016-08-11 MED ORDER — MOMETASONE FURO-FORMOTEROL FUM 100-5 MCG/ACT IN AERO: 2.0000 | INHALATION_SPRAY | Freq: Two times a day (BID) | RESPIRATORY_TRACT | Status: DC
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
## 2016-08-11 NOTE — Progress Notes (Signed)
Recreation Therapy Notes  INPATIENT RECREATION THERAPY ASSESSMENT  Patient Details Name: Veronnica Hennings MRN: [id number] DOB: 10/19/1965 Today's Date: 08/11/2016  Patient Stressors: Friends, Other (Comment) (Lack of supportive friends; group home - she is the only girl at the group home)  Coping Skills:   Arguments, Exercise, Art/Dance, Talking, Music, Other (Comment) (Deep breathing)  Personal Challenges: Anger, Communication, Concentration, Decision-Making, Expressing Yourself, Problem-Solving, Self-Esteem/Confidence, Social Interaction, Stress Management, Time Management, Trusting Others  Leisure Interests (2+):   ("No")  Awareness of Community Resources:  No  Community Resources:     Current Use:    If no, Barriers?:    Patient Strengths:  "No"  Patient Identified Areas of Improvement:  "Not really"  Current Recreation Participation:  Nothing  Patient Goal for Hospitalization:  To find somewhere to live  [place] of Residence:  [place] of Residence:  [place]   Current SI (including self-harm):  Yes  Current HI:  No  Consent to Intern Participation: N/A   Jacquelynn Cree, LRT/CTRS 08/11/2016, 3:13 PM

## 2016-08-11 NOTE — Progress Notes (Signed)
Patient complain of having chest pain.  When asked what it felt patient states "like a heart attack".  Asked patient to explain exactly what pain felt like in her chest. States "It feels like something is stabbing me"  Asked if it hurt when she took a deep breath.  Patient confirmed that it did.  Vital signs obtained.  See flow sheet.  Dr. Ardyth Harps informed.  New orders placed.

## 2016-08-11 NOTE — Tx Team (Addendum)
Interdisciplinary Treatment and Diagnostic Plan Update  08/11/2016 Time of Session: 1125  [name] MRN: [id number]  Principal Diagnosis: Schizoaffective disorder, bipolar type [facility])  Secondary Diagnoses: Principal Problem:   Schizoaffective disorder, bipolar type (HCC) Active Problems:   Cerebral palsy (HCC)   Mild intellectual disability   GERD (gastroesophageal reflux disease)   COPD (chronic obstructive pulmonary disease) (HCC)   Borderline personality disorder   Overdose by acetaminophen   Severe recurrent major depression without psychotic features (HCC)   Current Medications:  Current Facility-Administered Medications  Medication Dose Route Frequency Provider Last Rate Last Dose  . amantadine (SYMMETREL) capsule 100 mg  100 mg Oral BID Audery Amel, MD   100 mg at 08/11/16 0839  . aspirin EC tablet 81 mg  81 mg Oral Daily Audery Amel, MD   81 mg at 08/11/16 0839  . clonazePAM (KLONOPIN) tablet 0.5 mg  0.5 mg Oral BID Audery Amel, MD   0.5 mg at 08/11/16 0839  . divalproex (DEPAKOTE) DR tablet 500 mg  500 mg Oral Q12H Audery Amel, MD   500 mg at 08/11/16 0839  . escitalopram (LEXAPRO) tablet 20 mg  20 mg Oral Daily Audery Amel, MD   20 mg at 08/11/16 0839  . fenofibrate tablet 160 mg  160 mg Oral Daily Audery Amel, MD   160 mg at 08/11/16 4098  . mometasone-formoterol (DULERA) 100-5 MCG/ACT inhaler 2 puff  2 puff Inhalation BID Audery Amel, MD   2 puff at 08/11/16 [phone]  . pantoprazole (PROTONIX) EC tablet 40 mg  40 mg Oral Daily Audery Amel, MD   40 mg at 08/11/16 4782  . risperiDONE (RISPERDAL) tablet 3 mg  3 mg Oral QHS Audery Amel, MD   3 mg at 08/10/16 2300  . sucralfate (CARAFATE) tablet 1 g  1 g Oral TID WC & HS Audery Amel, MD   1 g at 08/11/16 9562   PTA Medications: Prescriptions Prior to Admission  Medication Sig Dispense Refill Last Dose  . acetaminophen (TYLENOL) 500 MG tablet Take 500 mg by mouth every 6 (six) hours as needed.    PRN at PRN  . amantadine (SYMMETREL) 100 MG capsule Take 1 capsule (100 mg total) by mouth 2 (two) times daily. (Patient taking differently: Take 100 mg by mouth daily. ) 60 capsule 0 08/08/2016 at 0800  . aspirin EC 81 MG tablet Take 81 mg by mouth daily.   08/08/2016 at 0800  . clonazePAM (KLONOPIN) 0.5 MG tablet Take 0.5 mg by mouth 2 (two) times daily.    08/08/2016 at 0800  . dicyclomine (BENTYL) 20 MG tablet Take 20 mg by mouth 4 (four) times daily -  before meals and at bedtime.   PRN at PRN  . divalproex (DEPAKOTE) 250 MG DR tablet Take 500 mg by mouth 2 (two) times daily.    08/08/2016 at 0800  . escitalopram (LEXAPRO) 20 MG tablet Take 30 mg by mouth daily.   08/08/2016 at 0800  . fenofibrate 160 MG tablet Take 160 mg by mouth daily.   08/08/2016 at 0800  . Fluticasone-Salmeterol (ADVAIR) 100-50 MCG/DOSE AEPB Inhale 1 puff into the lungs 2 (two) times daily.    08/08/2016 at 0800  . hydrOXYzine (ATARAX/VISTARIL) 50 MG tablet Take 50 mg by mouth at bedtime.    08/06/2016 at 2000  . loperamide (IMODIUM) 2 MG capsule Take 2 mg by mouth every 4 (four) hours.    PRN  at PRN  . magic mouthwash w/lidocaine SOLN Take 10 mLs by mouth 4 (four) times daily as needed (throat pain). Note to pharmacy - equal parts diphendydramine, aluminum hydroxide and lidocaine HCL 120 mL 0 PRN at PRN  . meloxicam (MOBIC) 15 MG tablet Take 15 mg by mouth daily.   PRN at PRN  . ondansetron (ZOFRAN) 4 MG tablet Take 1 tablet (4 mg total) by mouth every 8 (eight) hours as needed for nausea or vomiting. 20 tablet 0 PRN at PRN  . pantoprazole (PROTONIX) 40 MG tablet Take 40 mg by mouth daily.    08/08/2016 at 0800  . QUEtiapine (SEROQUEL) 25 MG tablet Take 25 mg by mouth 4 (four) times daily as needed. Take 25 mg by mouth 4 (four) times daily as needed for restlessness.   PRN at PRN  . risperiDONE (RISPERDAL) 3 MG tablet Take 1 tablet (3 mg total) by mouth at bedtime. 30 tablet 0 08/07/2016 at 2000  . sucralfate (CARAFATE) 1 g  tablet Take 1 g by mouth 4 (four) times daily -  with meals and at bedtime.   PRN at PRN    Patient Stressors: Other: does not like where she lives, lives in group home and she is only female  Patient Strengths: Active sense of humor Motivation for treatment/growth  Treatment Modalities: Medication Management, Group therapy, Case management,  1 to 1 session with clinician, Psychoeducation, Recreational therapy.   Physician Treatment Plan for Primary Diagnosis: Schizoaffective disorder, bipolar type (HCC) Long Term Goal(s):     Short Term Goals:    Medication Management: Evaluate patient's response, side effects, and tolerance of medication regimen.  Therapeutic Interventions: 1 to 1 sessions, Unit Group sessions and Medication administration.  Evaluation of Outcomes: Progressing  Physician Treatment Plan for Secondary Diagnosis: Principal Problem:   Schizoaffective disorder, bipolar type (HCC) Active Problems:   Cerebral palsy (HCC)   Mild intellectual disability   GERD (gastroesophageal reflux disease)   COPD (chronic obstructive pulmonary disease) (HCC)   Borderline personality disorder   Overdose by acetaminophen   Severe recurrent major depression without psychotic features (HCC)  Long Term Goal(s):     Short Term Goals:       Medication Management: Evaluate patient's response, side effects, and tolerance of medication regimen.  Therapeutic Interventions: 1 to 1 sessions, Unit Group sessions and Medication administration.  Evaluation of Outcomes: Progressing   RN Treatment Plan for Primary Diagnosis: Schizoaffective disorder, bipolar type (HCC) Long Term Goal(s): Knowledge of disease and therapeutic regimen to maintain health will improve  Short Term Goals: Ability to remain free from injury will improve  Medication Management: RN will administer medications as ordered by provider, will assess and evaluate patient's response and provide education to patient for  prescribed medication. RN will report any adverse and/or side effects to prescribing provider.  Therapeutic Interventions: 1 on 1 counseling sessions, Psychoeducation, Medication administration, Evaluate responses to treatment, Monitor vital signs and CBGs as ordered, Perform/monitor CIWA, COWS, AIMS and Fall Risk screenings as ordered, Perform wound care treatments as ordered.  Evaluation of Outcomes: Progressing   LCSW Treatment Plan for Primary Diagnosis: Schizoaffective disorder, bipolar type (HCC) Long Term Goal(s): Safe transition to appropriate next level of care at discharge, Engage patient in therapeutic group addressing interpersonal concerns.  Short Term Goals: Engage patient in aftercare planning with referrals and resources and Increase skills for wellness and recovery  Therapeutic Interventions: Assess for all discharge needs, 1 to 1 time with Social worker, Explore available  resources and support systems, Assess for adequacy in community support network, Educate family and significant other(s) on suicide prevention, Complete Psychosocial Assessment, Interpersonal group therapy.  Evaluation of Outcomes: Progressing    Recreational Therapy Treatment Plan for Primary Diagnosis: Severe recurrent major depression without psychotic features (HCC) Long Term Goal(s): Patient will participate in recreation therapy treatment in at least 2 group sessions without prompting from LRT  Short Term Goals: Increase self-esteem, Increase stress management skills  Treatment Modalities: Group Therapy and Individual Treatment Sessions  Therapeutic Interventions: Psychoeducation  Evaluation of Outcomes: Progressing   Progress in Treatment: Attending groups: No. Participating in groups: No. Taking medication as prescribed: Yes. Toleration medication: Yes. Family/Significant other contact made: No, will contact:  when given permission Patient understands diagnosis: Yes. Discussing patient  identified problems/goals with staff: Yes. Medical problems stabilized or resolved: Yes. Denies suicidal/homicidal ideation: Yes. Issues/concerns per patient self-inventory: No. Other: none  New problem(s) identified: No, Describe:  none  New Short Term/Long Term Goal(s): Pt goal: "I want a new group home."  Discharge Plan or Barriers: CSW assessing for appropriate plan.  Reason for Continuation of Hospitalization: Depression Medication stabilization  Estimated Length of Stay:1-2 days.  Attendees: Patient: [name] 08/11/2016   Physician: Dr. Ardyth Harps, MD 08/11/2016   Nursing: Malachi Pro 08/11/2016   RN Care Manager: 08/11/2016   Social Worker: Daleen Squibb, LCSW 08/11/2016   Recreational Therapist: Princella Ion, LRT/CTRS  08/11/2016   Other:  08/11/2016   Other:  08/11/2016   Other: 08/11/2016        Scribe for Treatment Team: Lorri Frederick, LCSW 08/11/2016 12:04 PM
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
## 2016-08-11 NOTE — [facility] Group Notes (Signed)
[facility] Group Notes:  (Nursing/MHT/Case Management/Adjunct)  Date:  08/11/2016  Time:  2:09 AM  Type of Therapy:  Group Therapy  Participation Level:  Did Not Attend  Participation Quality:Summary of Progress/Problems:  [name] 08/11/2016, 2:09 AM

## 2016-08-11 NOTE — Plan of Care (Signed)
Problem: Medication: Goal: Compliance with prescribed medication regimen will improve Outcome: Progressing Patient is compliant with prescribed medication

## 2016-08-11 NOTE — Plan of Care (Signed)
Problem: Medication: Goal: Compliance with prescribed medication regimen will improve Outcome: Progressing Medication compliant   
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
## 2016-08-11 NOTE — [facility] Suicide Risk Assessment (Signed)
[facility] Admission Suicide Risk Assessment   Nursing information obtained from:    Demographic factors:    Current Mental Status:    Loss Factors:    Historical Factors:    Risk Reduction Factors:     Principal Problem: Severe recurrent major depression without psychotic features (HCC) Diagnosis:   Patient Active Problem List   Diagnosis Date Noted  . Severe recurrent major depression without psychotic features (HCC) [F33.2] 08/10/2016  . Overdose by acetaminophen [T39.1X1A] 08/08/2016  . Schizoaffective disorder, bipolar type (HCC) [F25.0]   . 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 GERD (gastroesophageal reflux disease) [K21.9] 09/24/2014  . COPD (chronic obstructive pulmonary disease) (HCC) [J44.9] 09/24/2014  . Borderline personality disorder [F60.3] 09/24/2014  . Cerebral palsy (HCC) [G80.9] 08/31/2014  . Benign essential HTN [I10] 06/25/2013   Subjective Data:   Continued Clinical Symptoms:  Alcohol Use Disorder Identification Test Final Score (AUDIT): 0 The "Alcohol Use Disorders Identification Test", Guidelines for Use in Primary Care, Second Edition.  World Science writer [facility]). Score between 0-7:  no or low risk or alcohol related problems. Score between 8-15:  moderate risk of alcohol related problems. Score between 16-19:  high risk of alcohol related problems. Score 20 or above:  warrants further diagnostic evaluation for alcohol dependence and treatment.   CLINICAL FACTORS:   Depression:   Impulsivity Personality Disorders:   Cluster B More than one psychiatric diagnosis Previous Psychiatric Diagnoses and Treatments    Psychiatric Specialty Exam: Physical Exam  ROS  Blood pressure 103/65, pulse 79, temperature 98 F (36.7 C), temperature source Oral, resp. rate 18, height  (1.6 m), weight 87.5 kg (193 lb), SpO2 100 %.Body mass index is 34.19 kg/m.                                                    Sleep:  Number of  Hours: 6.25      COGNITIVE FEATURES THAT CONTRIBUTE TO RISK:  Closed-mindedness    SUICIDE RISK:   Mild:  Suicidal ideation of limited frequency, intensity, duration, and specificity.  There are no identifiable plans, no associated intent, mild dysphoria and related symptoms, good self-control (both objective and subjective assessment), few other risk factors, and identifiable protective factors, including available and accessible social support.  PLAN OF CARE: admit to [facility]  I certify that inpatient services furnished can reasonably be expected to improve the patient's condition.   Jimmy Footman, MD 08/11/2016, 12:36 PM

## 2016-08-11 NOTE — Progress Notes (Signed)
Recreation Therapy Notes  Date: 04.27.18 Time: 9:30 am Location: Craft Room  Group Topic: Coping Skills  Goal Area(s) Addresses:  Patient will participate in healthy coping skill. Patient will verbalize benefit of using art as a coping skill.  Behavioral Response: Attentive  Intervention: Coloring  Activity: Patients were given coloring sheets to color and were instructed to think of the emotion they were feeling as well as what their minds were focused on.   Education: LRT educated patients on healthy coping skills.  Education Outcome: In group clarification offered  Clinical Observations/Feedback: Patient colored coloring sheet. Patient did not contribute to group discussion.  Jacquelynn Cree, LRT/CTRS 08/11/2016 10:28 AM

## 2016-08-11 NOTE — [facility] Group Notes (Signed)
[facility] LCSW Group Therapy  08/11/2016 4:15 PM  Type of Therapy:  Group Therapy  Participation Level:  Patient did not attend group. CSW invited patient to group.   Summary of Progress/Problems: Stress management: Patients defined and discussed the topic of stress and the related symptoms and triggers for stress. Patients identified healthy coping skills they would like to try during hospitalization and after discharge to manage stress in a healthy way. CSW offered insight to varying stress management techniques.   Mayur Duman G. Garnette Czech MSW, LCSWA 08/11/2016, 4:15 PM

## 2016-08-12 LAB — PROLACTIN: PROLACTIN: 111.7 ng/mL — AB (ref 4.8–23.3)

## 2016-08-12 LAB — HEMOGLOBIN A1C
HEMOGLOBIN A1C: 4.8 % (ref 4.8–5.6)
MEAN PLASMA GLUCOSE: 91 mg/dL

## 2016-08-12 MED ORDER — IBUPROFEN 200 MG PO TABS
400.0000 mg | ORAL_TABLET | Freq: Four times a day (QID) | ORAL | Status: DC | PRN
  Administered 2016-08-12 (×2): 400 mg via ORAL
  Filled 2016-08-12 (×2): qty 2

## 2016-08-12 NOTE — Progress Notes (Signed)
D: Pt  Passive SI- contracts denies HI/AVH. Pt is pleasant and cooperative. Pt kept to herself most of the evening.   A: Pt was offered support and encouragement. Pt was given scheduled medications. Pt was encourage to attend groups. Q 15 minute checks were done for safety.   R:Pt receptive to treatment and safety maintained on unit.

## 2016-08-12 NOTE — Progress Notes (Signed)
Patient is isolated in the room most of the time.Affect is sad & tearful.Came to staff twice states "I am suicidal".Support & encouragement with given. With encouragement patient got out of bed to day room for watching movie.Patient stated that she does not want to go back to the group homeCompliant with medications.Appetite & energy level good.PRN given for left knee pain helped.
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
## 2016-08-12 NOTE — Progress Notes (Signed)
D: Pt denies SI/HI/AVH. Pt is pleasant and cooperative, affect is flat and sad but brightens upon approach. Patient has a history of cerebral palsy and it slightly affects her gait. Pt he appears less anxious and she is interacting with peers and staff appropriately.  A: Pt was offered support and encouragement. Pt was given scheduled medications. Pt was encouraged to attend groups. Q 15 minute checks were done for safety.  R:Pt attends groups and interacts well with peers and staff. Pt is taking medication. Pt has no complaints.Pt receptive to treatment and safety maintained on unit.

## 2016-08-12 NOTE — [facility] Group Notes (Signed)
[facility] LCSW Group Therapy  08/12/16 1pm Type of Therapy: Balance in Life Participation Level: Active  Participation Quality: Attentive, Sharing and Supportive  Affect: Appropriate  Cognitive: Alert and Oriented  Insight: Developing/Improving and Engaged  Engagement in Therapy: Developing/Improving and Engaged  Modes of Intervention: Clarification, Confrontation, Discussion, Education, Exploration, Limit-setting, Orientation, Problem-solving, Rapport Building, Dance movement psychotherapist, Socialization and Support  Summary of Progress/Problems: The topic for today was feelings about balance in life Pt discussed what balance in their life looks like and was asked what activities assist with sustaining their own balance and wellness. Patients were able to identify several key stressors that affect their balance and wellness. Pt processed their feelings and were able to relate to peers. Pt discussed several ways to sustain balance examples, getting good rest, nutrition, medication, keeping appointments, music, faith, exercise.  This patient reported she is not balanced in life and feels she has no purpose, she was emotional and this worker provided additional support during group. Patient was supported by her peers and she found comfort that she was not alone with her feelings of depression. Sherlynn Tourville Johnella Moloney, LCSW  08/12/2016 2:30pm

## 2016-08-13 NOTE — Progress Notes (Signed)
Patient with passive SI, sits in front of nurses station when feeling suicidal. Pt pleasant and cooperative. Denies HI, AVH. Encouragement and support offered. Safety checks maintained. Meds given as ordered Pt receptive and remains safe on unit with q 15 min checks.
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
## 2016-08-13 NOTE — [facility] Group Notes (Signed)
[facility] LCSW Group Therapy  08/13/2016 2:54 PM  Type of Therapy:  Group Therapy  Participation Level:  Patient did not attend group. CSW invited patient to group.   Summary of Progress/Problems: Coping Skills: Patients defined and discussed healthy coping skills. Patients identified healthy coping skills they would like to try during hospitalization and after discharge. CSW offered insight to varying coping skills that may have been new to patients such as practicing mindfulness.  Lylia Karn G. Garnette Czech MSW, LCSWA 08/13/2016, 2:56 PM

## 2016-08-13 NOTE — Plan of Care (Signed)
Problem: Self-Concept: Goal: Ability to verbalize positive feelings about self will improve Outcome: Not Progressing Pt continues with passive SI

## 2016-08-14 LAB — VALPROIC ACID LEVEL: Valproic Acid Lvl: 68 ug/mL (ref 50.0–100.0)

## 2016-08-14 MED ORDER — AMANTADINE HCL 100 MG PO CAPS: 100.0000 mg | ORAL_CAPSULE | Freq: Two times a day (BID) | ORAL | 0 refills | Status: DC

## 2016-08-14 NOTE — Plan of Care (Signed)
Problem: Self-Concept: Goal: Ability to disclose and discuss suicidal ideas will improve Outcome: Progressing Able to verbalize that she is having thoughts of harming self.  Verbalized plan is to take comb and scratch herself up.  Informed that I would be taking her comb away.  Patient verbalized understanding and states "that's why I am sitting in front of nurses station. "

## 2016-08-14 NOTE — [facility] Group Notes (Signed)
[facility] Group Notes:  (Nursing/MHT/Case Management/Adjunct)  Date:  08/14/2016  Time:  12:17 AM  Type of Therapy:  Group Therapy  Participation Level:  Did Not Attend  Participation Quality Summary of Progress/Problems:  Mayra Neer 08/14/2016, 12:17 AM

## 2016-08-14 NOTE — Plan of Care (Signed)
Problem: Medication: Goal: Compliance with prescribed medication regimen will improve Outcome: Progressing Takes medication without difficulty.
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
## 2016-08-14 NOTE — [facility] Suicide Risk Assessment (Signed)
[facility] Discharge Suicide Risk Assessment   Principal Problem: Severe recurrent major depression without psychotic features [facility]) Discharge Diagnoses:  Patient Active Problem List   Diagnosis Date Noted  . Severe recurrent major depression without psychotic features (H[facility]) [F33.2] 08/10/2016  . Overdose by acetaminophen [T39.1X1A] 08/08/2016  . Schizoaffective disorder, bipolar type (H[facility]) [F25.0]   . 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 GERD (gastroesophageal reflux disease) [K21.9] 09/24/2014  . COPD (chronic obstructive pulmonary disease) (HCC) [J44.9] 09/24/2014  . Borderline personality disorder [F60.3] 09/24/2014  . Cerebral palsy (HCC) [G80.9] 08/31/2014     Psychiatric Specialty Exam: ROS  Blood pressure 118/80, pulse 90, temperature 98.7 F (37.1 C), temperature source Oral, resp. rate 18, height  (1.6 m), weight 87.5 kg (193 lb), SpO2 100 %.Body mass index is 34.19 kg/m.                                                       Mental Status Per Nursing Assessment::   On Admission:     Demographic Factors:  Caucasian  Loss Factors: relational problems with peers at [facility]  Historical Factors: Prior suicide attempts and Impulsivity  Risk Reduction Factors:   Living with another person, especially a relative  Continued Clinical Symptoms:  More than one psychiatric diagnosis Previous Psychiatric Diagnoses and Treatments  Cognitive Features That Contribute To Risk:  Closed-mindedness    Suicide Risk:  Minimal: No identifiable suicidal ideation.  Patients presenting with no risk factors but with morbid ruminations; may be classified as minimal risk based on the severity of the depressive symptoms    Jimmy Footman, MD 08/14/2016, 10:43 AM

## 2016-08-14 NOTE — Progress Notes (Signed)
Recreation Therapy Notes  Date: 04.30.18 Time: 9:30 am Location: Craft Room  Group Topic: Self-expression  Goal Area(s) Addresses:  Patient will identify one color per emotion listed on wheel. Patient will verbalize benefit of using art as a means of self-expression. Patient will verbalize one emotion experienced during session. Patient will be educated on other forms of self-expression.  Behavioral Response: Attentive  Intervention: Emotion Wheel  Activity: Patients were given an Arboriculturist and were instructed to pick a color for each emotion listed on the wheel.  Education: LRT educated patients on other forms of self-expression.  Education Outcome: In group clarification offered   Clinical Observations/Feedback: Patient picked a color for some of the emotions listed. Patient did not contribute to group discussion.  Jacquelynn Cree, LRT/CTRS 08/14/2016 10:10 AM

## 2016-08-14 NOTE — Progress Notes (Signed)
  [facility] Adult Case Management Discharge Plan :  Will you be returning to the same living situation after discharge:  Yes,  [facility] group [facility] At discharge, do you have transportation home?: Yes,  group home Do you have the ability to pay for your medications: Yes,  medicare  Release of information consent forms completed and in the chart;  Patient's signature needed at discharge.  Patient to Follow up at: Follow-up Information    [facility], [place]. Go on 08/21/2016.   Why:  Please attend your follow up appointment at [facility] on 08/21/16 at 2pm.  Please bring a copy of your hospital discharge paperwork. Contact information: [address] [place] [address] [phone]           Next level of care provider has access to [facility] Link:no  Safety Planning and Suicide Prevention discussed: Yes,  with brother  Have you used any form of tobacco in the last 30 days? (Cigarettes, Smokeless Tobacco, Cigars, and/or Pipes): No  Has patient been referred to the Quitline?: N/A patient is not a smoker  Patient has been referred for addiction treatment: Yes  Lorri Frederick, LCSW 08/14/2016, 12:48 PM

## 2016-08-14 NOTE — [facility] Suicide Risk Assessment (Signed)
[facility] INPATIENT:  Family/Significant Other Suicide Prevention Education  Suicide Prevention Education:  Education Completed; [name], brother, [phone], has been identified by the patient as the family member/significant other with whom the patient will be residing, and identified as the person(s) who will aid the patient in the event of a mental health crisis (suicidal ideations/suicide attempt).  With written consent from the patient, the family member/significant other has been provided the following suicide prevention education, prior to the and/or following the discharge of the patient.  The suicide prevention education provided includes the following:  Suicide risk factors  Suicide prevention and interventions  National Suicide Hotline telephone number  [facility] assessment telephone number  [facility] Emergency Assistance 911  [facility] and/or Residential Mobile Crisis Unit telephone number  Request made of family/significant other to:  Remove weapons (e.g., guns, rifles, knives), all items previously/currently identified as safety concern.    Remove drugs/medications (over-the-counter, prescriptions, illicit drugs), all items previously/currently identified as a safety concern.  The family member/significant other verbalizes understanding of the suicide prevention education information provided.  The family member/significant other agrees to remove the items of safety concern listed above.  Brother reports that there was talk of pt having a legal guardian, but he did not think this occurred.  He is not aware that a guardian is in place.  He reports pt frequently gets upset at her group home placements and wants to move, which has been the case at her current placement.  She has reported suicidality on numerous occasions as a way of getting changes that she wants.  Lorri Frederick, LCSW 08/14/2016, 9:08 AM

## 2016-08-14 NOTE — Plan of Care (Signed)
Problem: Safety: Goal: Ability to remain free from injury will improve Outcome: Progressing Patient demonstrates understanding safety recommendations from staff.

## 2016-08-14 NOTE — Plan of Care (Signed)
Problem: [facility] Participation in Recreation Therapeutic Interventions Goal: STG-Patient will demonstrate improved self esteem by identif STG: Self-Esteem - Within 4 treatment sessions, patient will verbalize at least 5 positive affirmation statements in each of 2 treatment sessions to increase self-esteem.  Outcome: Not Applicable Date Met: 28/20/81 At approximately 11:45 am, patient reported she felt good about her self-esteem and did not feel that she needed help at this time.  Leonette Monarch, LRT/CTRS 04.30.18 11:53 am Goal: STG-Other Recreation Therapy Goal (Specify) STG: Stress Management - Within 4 treatment sessions, patient will verbalize understanding of the stress management techniques in each of 2 treatment sessions to increase stress management skills.  Outcome: Not Applicable Date Met: 38/87/19 At approximately 11:45 am, patient reported she felt good about stress management and did not feel that she needed help at this time.  Leonette Monarch, LRT/CTRS 04.30.18 11:54 am

## 2016-08-14 NOTE — [facility] Group Notes (Signed)
[facility] LCSW Group Therapy Note  Date/Time: 08/14/16, 1300  Type of Therapy and Topic:  Group Therapy:  Overcoming Obstacles  Participation Level:  Pt did not attend group.  Description of Group:    In this group patients will be encouraged to explore what they see as obstacles to their own wellness and recovery. They will be guided to discuss their thoughts, feelings, and behaviors related to these obstacles. The group will process together ways to cope with barriers, with attention given to specific choices patients can make. Each patient will be challenged to identify changes they are motivated to make in order to overcome their obstacles. This group will be process-oriented, with patients participating in exploration of their own experiences as well as giving and receiving support and challenge from other group members.  Therapeutic Goals: 1. Patient will identify personal and current obstacles as they relate to admission. 2. Patient will identify barriers that currently interfere with their wellness or overcoming obstacles.  3. Patient will identify feelings, thought process and behaviors related to these barriers. 4. Patient will identify two changes they are willing to make to overcome these obstacles:    Summary of Patient Progress      Therapeutic Modalities:   Cognitive Behavioral Therapy Solution Focused Therapy Motivational Interviewing Relapse Prevention Therapy  Greg Idil Maslanka, LCSW 
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
## 2016-08-14 NOTE — [place] FL2 (Signed)
[place] MEDICAID FL2 LEVEL OF CARE SCREENING TOOL     IDENTIFICATION  Patient Name: [name] Birthdate: 1965/06/07 Sex: female Admission Date (Current Location): 08/10/2016  [place] and [place] Number:  [address] [phone]  Facility and Address:  [facility], [address] [place], [place] [address]      Provider Number: [id number]  Attending Physician Name and Address:  Barnabas Harries*  Relative Name and Phone Number:  Amiracle Neises, brother, [phone]    Current Level of Care: Hospital Recommended Level of Care: [facility] Prior Approval Number:    Date Approved/Denied:   PASRR Number:    Discharge Plan: Other (Comment) (group home)    Current Diagnoses: Patient Active Problem List   Diagnosis Date Noted  . Severe recurrent major depression without psychotic features (HCC) 08/10/2016  . Overdose by acetaminophen 08/08/2016  . Schizoaffective disorder, bipolar type (HCC)   . 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 GERD (gastroesophageal reflux disease) 09/24/2014  . COPD (chronic obstructive pulmonary disease) (HCC) 09/24/2014  . Borderline personality disorder 09/24/2014  . Cerebral palsy (HCC) 08/31/2014    Orientation RESPIRATION BLADDER Height & Weight     Self, Time, Situation, Place  Normal Continent Weight: 193 lb (87.5 kg) Height:   (160 cm)  BEHAVIORAL SYMPTOMS/MOOD NEUROLOGICAL BOWEL NUTRITION STATUS   (na)  (na) Continent  (na)  AMBULATORY STATUS COMMUNICATION OF NEEDS Skin   Independent Verbally Normal                       Personal Care Assistance Level of Assistance   (na)           Functional Limitations Info   (na)          SPECIAL CARE FACTORS FREQUENCY   (na)                    Contractures Contractures Info: Not present    Additional Factors Info   (na)               Current Medications (08/14/2016):  This is the current hospital active  medication list Current Facility-Administered Medications  Medication Dose Route Frequency Provider Last Rate Last Dose  . amantadine (SYMMETREL) capsule 100 mg  100 mg Oral BID Audery Amel, MD   100 mg at 08/14/16 8469  . aspirin EC tablet 81 mg  81 mg Oral Daily Audery Amel, MD   81 mg at 08/14/16 6295  . clonazePAM (KLONOPIN) tablet 0.5 mg  0.5 mg Oral BID Audery Amel, MD   0.5 mg at 08/14/16 2841  . divalproex (DEPAKOTE) DR tablet 500 mg  500 mg Oral Q12H Audery Amel, MD   500 mg at 08/14/16 3244  . escitalopram (LEXAPRO) tablet 20 mg  20 mg Oral Daily Audery Amel, MD   20 mg at 08/14/16 0102  . fenofibrate tablet 160 mg  160 mg Oral Daily Audery Amel, MD   160 mg at 08/14/16 7253  . ibuprofen (ADVIL,MOTRIN) tablet 400 mg  400 mg Oral Q6H PRN Jimmy Footman, MD   400 mg at 08/12/16 2233  . mometasone-formoterol (DULERA) 100-5 MCG/ACT inhaler 2 puff  2 puff Inhalation BID Audery Amel, MD   2 puff at 08/14/16 [phone]  . pantoprazole (PROTONIX) EC tablet 40 mg  40 mg Oral Daily Audery Amel, MD   40 mg at 08/14/16 0347  . risperiDONE (RISPERDAL) tablet 3 mg  3 mg Oral QHS Audery Amel, MD   3 mg at 08/13/16 2139  . sucralfate (CARAFATE) tablet 1 g  1 g Oral TID WC & HS Audery Amel, MD   1 g at 08/14/16 1248     Discharge Medications: Please see discharge summary for a list of discharge medications.  Relevant Imaging Results:  Relevant Lab Results:   Additional Information none  Lorri Frederick, LCSW

## 2016-08-14 NOTE — Progress Notes (Signed)
Affect sad. Denies SI but having thoughts of using a comb to scratch self.  Denies HI/AVH. Discharge instructions given, verbalized understanding.  Prescriptions given and personal belongings returned. Escorted off unit to meet group home staff to travel back to group home.

## 2016-08-14 NOTE — Progress Notes (Signed)
TC staff at [facility] group home.  CSW asked regarding pt having a legal guardian and was informed pt does not have a legal guardian at this time. Garner Nash, MSW, LCSW Clinical Social Worker 08/14/2016 12:41 PM

## 2016-08-14 NOTE — Progress Notes (Signed)
D: Patient was observed sleeping soundly at change of shift.  She was awakened for wrap up group but refused to attend.  She was encouraged to stay awake until after HS medications. A: Support and information was given.  Medications were described and purpose discussed. R: Patient remained in bed and was compliant with medication administration.  She was calm and cooperative but did not attend wrap up group or snack.  She contracts for safety and denies SI/HI/AVH.

## 2016-08-15 LAB — THYROID PANEL WITH TSH
FREE THYROXINE INDEX: 2.2 (ref 1.2–4.9)
T3 UPTAKE RATIO: 25 % (ref 24–39)
T4 TOTAL: 8.8 ug/dL (ref 4.5–12.0)
TSH: 3.8 u[IU]/mL (ref 0.450–4.500)

## 2016-08-15 NOTE — Progress Notes (Signed)
Recreation Therapy Notes  INPATIENT RECREATION TR PLAN  Patient Details Name: [name] MRN: [id number] DOB: 04-29-1965 Today's Date: 08/15/2016  Rec Therapy Plan Is patient appropriate for Therapeutic Recreation?: Yes Treatment times per week: At least once a week TR Treatment/Interventions: 1:1 session, Group participation (Comment) (Appropriate participation in daily recreational therapy tx)  Discharge Criteria Pt will be discharged from therapy if:: Treatment goals are met, Discharged Treatment plan/goals/alternatives discussed and agreed upon by:: Patient/family  Discharge Summary Short term goals set: See Care Plan Short term goals met: Complete Progress toward goals comments: Groups attended Which groups?: Coping skills, Other (Comment) (Self-expression) Reason goals not met: Patient reported she felt good and did not need help at this time Reason patient discharged from therapy: Discharge from hospital Pt/family agrees with progress & goals achieved: Yes Date patient discharged from therapy: 08/14/16   Leonette Monarch, LRT/CTRS 08/15/2016, 1:12 PM
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
## 2017-01-17 ENCOUNTER — Emergency Department (HOSPITAL_COMMUNITY)
Admission: EM | Admit: 2017-01-17 | Discharge: 2017-01-18 | Disposition: A | Discharge status: Home / Self Care | Payer: Medicare Other | Attending: Emergency Medicine | Admitting: Emergency Medicine

## 2017-01-17 ENCOUNTER — Encounter (HOSPITAL_COMMUNITY): Payer: Self-pay | Admitting: Emergency Medicine

## 2017-01-17 ENCOUNTER — Emergency Department (HOSPITAL_COMMUNITY): Payer: Medicare Other

## 2017-01-17 DIAGNOSIS — J449 Chronic obstructive pulmonary disease, unspecified: Secondary | ICD-10-CM | POA: Diagnosis not present

## 2017-01-17 DIAGNOSIS — Z7982 Long term (current) use of aspirin: Secondary | ICD-10-CM | POA: Diagnosis not present

## 2017-01-17 DIAGNOSIS — R45850 Homicidal ideations: Secondary | ICD-10-CM | POA: Diagnosis not present

## 2017-01-17 DIAGNOSIS — Z880 Allergy status to penicillin: Secondary | ICD-10-CM | POA: Diagnosis not present

## 2017-01-17 DIAGNOSIS — R05 Cough: Secondary | ICD-10-CM | POA: Insufficient documentation

## 2017-01-17 DIAGNOSIS — F329 Major depressive disorder, single episode, unspecified: Secondary | ICD-10-CM | POA: Diagnosis not present

## 2017-01-17 DIAGNOSIS — R0789 Other chest pain: Secondary | ICD-10-CM | POA: Diagnosis present

## 2017-01-17 DIAGNOSIS — Z87891 Personal history of nicotine dependence: Secondary | ICD-10-CM | POA: Insufficient documentation

## 2017-01-17 DIAGNOSIS — Z046 Encounter for general psychiatric examination, requested by authority: Secondary | ICD-10-CM | POA: Diagnosis not present

## 2017-01-17 DIAGNOSIS — Z79899 Other long term (current) drug therapy: Secondary | ICD-10-CM | POA: Diagnosis not present

## 2017-01-17 DIAGNOSIS — R45851 Suicidal ideations: Secondary | ICD-10-CM | POA: Insufficient documentation

## 2017-01-17 DIAGNOSIS — J45909 Unspecified asthma, uncomplicated: Secondary | ICD-10-CM | POA: Insufficient documentation

## 2017-01-17 DIAGNOSIS — F603 Borderline personality disorder: Secondary | ICD-10-CM | POA: Insufficient documentation

## 2017-01-17 LAB — I-STAT CHEM 8, ED
BUN: 16 mg/dL (ref 6–20)
CALCIUM ION: 1.15 mmol/L (ref 1.15–1.40)
CHLORIDE: 102 mmol/L (ref 101–111)
CREATININE: 0.8 mg/dL (ref 0.44–1.00)
GLUCOSE: 98 mg/dL (ref 65–99)
HCT: 40 % (ref 36.0–46.0)
Hemoglobin: 13.6 g/dL (ref 12.0–15.0)
POTASSIUM: 3.5 mmol/L (ref 3.5–5.1)
Sodium: 144 mmol/L (ref 135–145)
TCO2: 29 mmol/L (ref 22–32)

## 2017-01-17 LAB — VALPROIC ACID LEVEL: Valproic Acid Lvl: 57 ug/mL (ref 50.0–100.0)

## 2017-01-17 LAB — CBC WITH DIFFERENTIAL/PLATELET
BASOS ABS: 0.1 10*3/uL (ref 0.0–0.1)
Basophils Relative: 1 %
EOS PCT: 5 %
Eosinophils Absolute: 0.3 10*3/uL (ref 0.0–0.7)
HCT: 38.7 % (ref 36.0–46.0)
Hemoglobin: 12.8 g/dL (ref 12.0–15.0)
LYMPHS ABS: 1.5 10*3/uL (ref 0.7–4.0)
LYMPHS PCT: 24 %
MCH: 33.2 pg (ref 26.0–34.0)
MCHC: 33.1 g/dL (ref 30.0–36.0)
MCV: 100.3 fL — AB (ref 78.0–100.0)
MONO ABS: 0.9 10*3/uL (ref 0.1–1.0)
Monocytes Relative: 15 %
NEUTROS ABS: 3.5 10*3/uL (ref 1.7–7.7)
Neutrophils Relative %: 55 %
Platelets: 239 10*3/uL (ref 150–400)
RBC: 3.86 MIL/uL — ABNORMAL LOW (ref 3.87–5.11)
RDW: 13.7 % (ref 11.5–15.5)
WBC: 6.4 10*3/uL (ref 4.0–10.5)

## 2017-01-17 LAB — ETHANOL: Alcohol, Ethyl (B): <10 mg/dL (ref <10)

## 2017-01-17 MED ORDER — AMANTADINE HCL 100 MG PO CAPS
ORAL_CAPSULE | ORAL | Status: AC
  Filled 2017-01-17: qty 1

## 2017-01-17 MED ORDER — SUCRALFATE 1 G PO TABS
1.0000 g | ORAL_TABLET | Freq: Three times a day (TID) | ORAL | Status: DC
  Administered 2017-01-17 – 2017-01-18 (×3): 1 g via ORAL
  Filled 2017-01-17 (×3): qty 1

## 2017-01-17 MED ORDER — FENOFIBRATE 160 MG PO TABS
160.0000 mg | ORAL_TABLET | Freq: Every day | ORAL | Status: DC
  Administered 2017-01-17 – 2017-01-18 (×2): 160 mg via ORAL
  Filled 2017-01-17 (×4): qty 1

## 2017-01-17 MED ORDER — ESCITALOPRAM OXALATE 10 MG PO TABS
ORAL_TABLET | ORAL | Status: AC
  Filled 2017-01-17: qty 3

## 2017-01-17 MED ORDER — CLONAZEPAM 0.5 MG PO TABS
0.5000 mg | ORAL_TABLET | Freq: Two times a day (BID) | ORAL | Status: DC
  Administered 2017-01-17 – 2017-01-18 (×2): 0.5 mg via ORAL
  Filled 2017-01-17 (×2): qty 1

## 2017-01-17 MED ORDER — PANTOPRAZOLE SODIUM 40 MG PO TBEC
40.0000 mg | DELAYED_RELEASE_TABLET | Freq: Every day | ORAL | Status: DC
Start: 2017-01-17 — End: 2017-01-18
  Administered 2017-01-18: 40 mg via ORAL
  Filled 2017-01-17: qty 1

## 2017-01-17 MED ORDER — ASPIRIN EC 81 MG PO TBEC
81.0000 mg | DELAYED_RELEASE_TABLET | Freq: Every day | ORAL | Status: DC
  Administered 2017-01-18: 81 mg via ORAL
  Filled 2017-01-17: qty 1

## 2017-01-17 MED ORDER — DIVALPROEX SODIUM 250 MG PO DR TAB
500.0000 mg | DELAYED_RELEASE_TABLET | Freq: Two times a day (BID) | ORAL | Status: DC
  Administered 2017-01-17 – 2017-01-18 (×2): 500 mg via ORAL
  Filled 2017-01-17 (×2): qty 2

## 2017-01-17 MED ORDER — RISPERIDONE 1 MG PO TABS
ORAL_TABLET | ORAL | Status: AC
  Filled 2017-01-17: qty 3

## 2017-01-17 MED ORDER — ACETAMINOPHEN 325 MG PO TABS
650.0000 mg | ORAL_TABLET | Freq: Once | ORAL | Status: AC
Start: 2017-01-17 — End: 2017-01-17
  Administered 2017-01-17: 650 mg via ORAL
  Filled 2017-01-17: qty 2

## 2017-01-17 MED ORDER — AMANTADINE HCL 100 MG PO CAPS
100.0000 mg | ORAL_CAPSULE | Freq: Two times a day (BID) | ORAL | Status: DC
  Administered 2017-01-17 – 2017-01-18 (×2): 100 mg via ORAL
  Filled 2017-01-17 (×4): qty 1

## 2017-01-17 MED ORDER — ESCITALOPRAM OXALATE 10 MG PO TABS
30.0000 mg | ORAL_TABLET | Freq: Every day | ORAL | Status: DC
  Administered 2017-01-17: 30 mg via ORAL
  Filled 2017-01-17 (×3): qty 1

## 2017-01-17 MED ORDER — RISPERIDONE 1 MG PO TABS
3.0000 mg | ORAL_TABLET | Freq: Every day | ORAL | Status: DC
  Administered 2017-01-17: 3 mg via ORAL
  Filled 2017-01-17 (×2): qty 1

## 2017-01-17 MED ORDER — MOMETASONE FURO-FORMOTEROL FUM 100-5 MCG/ACT IN AERO
2.0000 | INHALATION_SPRAY | Freq: Two times a day (BID) | RESPIRATORY_TRACT | Status: DC
  Administered 2017-01-17 – 2017-01-18 (×2): 2 via RESPIRATORY_TRACT
  Filled 2017-01-17 (×2): qty 8.8

## 2017-01-17 NOTE — ED Triage Notes (Signed)
Per EMS pt states that she has had chest pain x 6 months and a cough x 1 week.  Pt experiences pain with cough and when she presses on her chest.  Pt is complaining of left numbness and nausea.    110/90 CBG 87 HR 81

## 2017-01-17 NOTE — ED Notes (Signed)
Pt continues to as for RN. PT notified it was shift change and would be a while. I sitter notified NT to notify RN.

## 2017-01-17 NOTE — ED Notes (Signed)
lexapro and fenofibrate requested from [facility].
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
## 2017-01-17 NOTE — ED Provider Notes (Signed)
[facility] DEPT Provider Note   CSN: [id number] Arrival date & time: 01/17/17  1545     History   Chief Complaint Chief Complaint  Patient presents with  . Chest Pain    HPI [name] is a 51 y.o. female.Plains of anterior chest pain onset 6 months ago. Has pain each day constantly. Pain is worse with coughing not improved by anything nonexertional. She's been coughing up yellow phlegm for approximately the past 3 weeks. Says his symptoms include mild shortness of breath. She denies any nausea denies fever. She also reports numbness in her left hand for several weeks. No treatment prior to coming here. No other associated symptoms  HPI  Past Medical History:  Diagnosis Date  . Anxiety   . Asthma   . Borderline personality disorder (HCC)   . Cerebral palsy (HCC)   . Depression   . GERD (gastroesophageal reflux disease)   . Mild cognitive impairment   . Schizoaffective disorder (HCC)   . Wound abscess     Patient Active Problem List   Diagnosis Date Noted  . Severe recurrent major depression without psychotic features (HCC) 08/10/2016  . Overdose by acetaminophen 08/08/2016  . Schizoaffective disorder, bipolar type (HCC)   . 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 GERD (gastroesophageal reflux disease) 09/24/2014  . COPD (chronic obstructive pulmonary disease) (HCC) 09/24/2014  . Borderline personality disorder (HCC) 09/24/2014  . Cerebral palsy (HCC) 08/31/2014    Past Surgical History:  Procedure Laterality Date  . CHOLECYSTECTOMY    . INCISION AND DRAINAGE ABSCESS Right 01/02/2015   Procedure: INCISION AND DRAINAGE ABSCESS;  Surgeon: Tiney Rouge III, MD;  Location: [facility] ORS;  Service: General;  Laterality: Right;  . TONSILLECTOMY      OB History    Gravida Para Term Preterm AB Living   0 0 0 0 0 0   SAB TAB Ectopic Multiple Live Births   0 0 0 0         Home Medications    Prior to Admission medications   Medication Sig Start Date End Date  Taking? Authorizing Provider  amantadine (SYMMETREL) 100 MG capsule Take 1 capsule (100 mg total) by mouth 2 (two) times daily. 08/14/16   Jimmy Footman, MD  aspirin EC 81 MG tablet Take 81 mg by mouth daily.    [provider]  clonazePAM (KLONOPIN) 0.5 MG tablet Take 0.5 mg by mouth 2 (two) times daily.     [provider]  divalproex (DEPAKOTE) 250 MG DR tablet Take 500 mg by mouth 2 (two) times daily.     [provider]  escitalopram (LEXAPRO) 20 MG tablet Take 30 mg by mouth daily.    [provider]  fenofibrate 160 MG tablet Take 160 mg by mouth daily.    [provider]  Fluticasone-Salmeterol (ADVAIR) 100-50 MCG/DOSE AEPB Inhale 1 puff into the lungs 2 (two) times daily.     [provider]  pantoprazole (PROTONIX) 40 MG tablet Take 40 mg by mouth daily.     [provider]  risperiDONE (RISPERDAL) 3 MG tablet Take 1 tablet (3 mg total) by mouth at bedtime. 09/25/15   Minna Antis, MD  sucralfate (CARAFATE) 1 g tablet Take 1 g by mouth 4 (four) times daily -  with meals and at bedtime.    [provider]    Family History Family History  Problem Relation Age of Onset  . Heart attack Father   . Diabetes Mother  Social History Social History  Substance Use Topics  . Smoking status: Never Smoker  . Smokeless tobacco: Never Used  . Alcohol use No   Ex-smoker quit 2011. No illicit drug use. Lives in group home  Allergies   Penicillins   Review of Systems Review of Systems  Constitutional: Negative.   HENT: Negative.   Respiratory: Positive for cough and shortness of breath.   Cardiovascular: Positive for chest pain.  Gastrointestinal: Negative.   Musculoskeletal: Negative.   Skin: Negative.   Neurological: Positive for numbness.  Psychiatric/Behavioral: Negative.   All other systems reviewed and are negative.    Physical Exam Updated Vital Signs BP 126/84 (BP Location:  Left Arm)   Pulse 72   Temp 98.2 F (36.8 C) (Oral)   Resp (!) 23   Ht  (1.6 m)   Wt 87.5 kg (193 lb)   SpO2 96%   BMI 34.19 kg/m   Physical Exam  Constitutional: She appears well-developed and well-nourished.  HENT:  Head: Normocephalic and atraumatic.  Eyes: Pupils are equal, round, and reactive to light. Conjunctivae are normal.  Neck: Neck supple. No tracheal deviation present. No thyromegaly present.  Cardiovascular: Normal rate and regular rhythm.   No murmur heard. Pulmonary/Chest: Effort normal and breath sounds normal. She exhibits tenderness.  Chest tender anteriorly reproducing pain exactly  Abdominal: Soft. Bowel sounds are normal. She exhibits no distension. There is no tenderness.  Musculoskeletal: Normal range of motion. She exhibits no edema or tenderness.  All 4 extremities without redness or tenderness neurovascular intact. Left upper extremity with full range of motion. Sensation intact to light touch in all fingers. Radial pulse 2+ all digits with good capillary refill. All other extremities are redness swelling or tenderness neurovascularly intact  Neurological: She is alert. No cranial nerve deficit. Coordination normal.  Gait normal motor strength 5 over 5 overall  Skin: Skin is warm and dry. Capillary refill takes less than 2 seconds. No rash noted.  Psychiatric: She has a normal mood and affect.  Nursing note and vitals reviewed.    ED Treatments / Results  Labs (all labs ordered are listed, but only abnormal results are displayed) Labs Reviewed  CBC WITH DIFFERENTIAL/PLATELET  I-STAT CHEM 8, ED    EKG  EKG Interpretation  Date/Time:  Wednesday January 17 2017 15:51:33 EDT Ventricular Rate:  74 PR Interval:    QRS Duration: 94 QT Interval:  418 QTC Calculation: 464 R Axis:   15 Text Interpretation:  Sinus rhythm Low voltage, precordial leads Abnormal R-wave progression, early transition No significant change since last tracing Confirmed  by Doug Sou [phone]) on 01/17/2017 4:09:20 PM       Radiology No results found.  Procedures Procedures (including critical care time)  Medications Ordered in ED Medications  acetaminophen (TYLENOL) tablet 650 mg (not administered)   Chest x-ray viewed by me Results for orders placed or performed during the hospital encounter of 01/17/17  CBC with Differential/Platelet  Result Value Ref Range   WBC 6.4 4.0 - 10.5 K/uL   RBC 3.86 (L) 3.87 - 5.11 MIL/uL   Hemoglobin 12.8 12.0 - 15.0 g/dL   HCT 40.9 81.1 - 91.4 %   MCV 100.3 (H) 78.0 - 100.0 fL   MCH 33.2 26.0 - 34.0 pg   MCHC 33.1 30.0 - 36.0 g/dL   RDW 78.2 95.6 - 21.3 %   Platelets 239 150 - 400 K/uL   Neutrophils Relative % 55 %   Neutro Abs 3.5  1.7 - 7.7 K/uL   Lymphocytes Relative 24 %   Lymphs Abs 1.5 0.7 - 4.0 K/uL   Monocytes Relative 15 %   Monocytes Absolute 0.9 0.1 - 1.0 K/uL   Eosinophils Relative 5 %   Eosinophils Absolute 0.3 0.0 - 0.7 K/uL   Basophils Relative 1 %   Basophils Absolute 0.1 0.0 - 0.1 K/uL  Ethanol  Result Value Ref Range   Alcohol, Ethyl (B) <10 <10 mg/dL  Valproic acid level  Result Value Ref Range   Valproic Acid Lvl 57 50.0 - 100.0 ug/mL  I-stat chem 8, ed  Result Value Ref Range   Sodium 144 135 - 145 mmol/L   Potassium 3.5 3.5 - 5.1 mmol/L   Chloride 102 101 - 111 mmol/L   BUN 16 6 - 20 mg/dL   Creatinine, Ser 5.78 0.44 - 1.00 mg/dL   Glucose, Bld 98 65 - 99 mg/dL   Calcium, Ion 4.69 6.29 - 1.40 mmol/L   TCO2 29 22 - 32 mmol/L   Hemoglobin 13.6 12.0 - 15.0 g/dL   HCT 52.8 41.3 - 24.4 %   Dg Chest 2 View  Result Date: 01/17/2017 CLINICAL DATA:  Chest pain for 6 months. Productive cough for 1 week. Left-sided numbness and nausea. Dizziness. EXAM: CHEST  2 VIEW COMPARISON:  None. FINDINGS: The heart size and mediastinal contours are within normal limits. Both lungs are clear. The visualized skeletal structures are unremarkable. IMPRESSION: 1. No active cardiopulmonary  disease is radiographically apparent. A cause for the patient's chronic chest pain is not identified. Electronically Signed   By: Gaylyn Rong M.D.   On: 01/17/2017 16:49   Initial Impression / Assessment and Plan / ED Course  I have reviewed the triage vital signs and the nursing notes.  Pertinent labs & imaging results that were available during my care of the patient were reviewed by me and considered in my medical decision making (see chart for details).     5 PM patient confided to the nurse and then confirmed with me that she is feeling homicidal reports another person in her group home, she states she would beat her to death. She also is feeling suicidal saying she would cut her wrists. Patient is medically cleared for psychiatric evaluation. She agrees to inpatient psychiatric stay. TTS consulted Final Clinical Impressions(s) / ED Diagnoses  Diagnosis #1 homicidal ideation #2 suicidal ideation #3 chronic cough #4 chronic chest pain Final diagnoses:  None    New Prescriptions New Prescriptions   No medications on file     Doug Sou, MD 01/17/17 1925

## 2017-01-17 NOTE — ED Notes (Signed)
Pt states that she is SI/HI.

## 2017-01-17 NOTE — ED Notes (Signed)
Pt just finished TTS 

## 2017-01-18 DIAGNOSIS — R05 Cough: Secondary | ICD-10-CM | POA: Diagnosis not present

## 2017-01-18 MED ORDER — ESCITALOPRAM OXALATE 10 MG PO TABS
30.0000 mg | ORAL_TABLET | Freq: Every day | ORAL | Status: DC
  Administered 2017-01-18: 30 mg via ORAL
  Filled 2017-01-18 (×2): qty 3

## 2017-01-18 MED ORDER — ACETAMINOPHEN 325 MG PO TABS
650.0000 mg | ORAL_TABLET | ORAL | Status: DC | PRN
  Administered 2017-01-18: 650 mg via ORAL
  Filled 2017-01-18: qty 2

## 2017-01-18 NOTE — BHH Counselor (Signed)
Re-assessment:   Report SI with a plan to overdose. Report HI, want to harm another resident in the room home, Rosey Bath. Report plan to hit her and knock her to the floor. Report auditory and visual hallucinations. Report hearing voices telling instructing her to harm herself and others. Report seeing people walk in front of her who's not real.   Disposition: Patient meets criteria for inpatient, per Assunta Found, NP

## 2017-01-18 NOTE — ED Notes (Signed)
Pt picked up [facility]. VSS

## 2017-01-18 NOTE — ED Notes (Signed)
Pt with TTS at this moment.

## 2017-01-18 NOTE — BHH Counselor (Signed)
Collateral:   TTS contacted [facility] @ 2:44pm informing the nurse Katherene could be discharged. TTS was asked to contact the group home.    TTS Clinical research associate contacted Debra @ 2:51pm with the group home informing her the patient is ready for discharged. Stanton Kidney reported she would call a ride to pick her up but does not know what time the person would get to the hospital.

## 2017-01-18 NOTE — BHH Group Notes (Signed)
Collateral information:    TTS called patient's group home @ 1:41 pm gathering collateral information from the patient group home supervisor. The Clinical research associate spoke with Stanton Kidney, the supervisor in charge at [facility]. Stanton Kidney report patient has diagnosis Borderline Personality Identity, Bi-polar, Schizophrenia, Major Depression with Psychotic features and is Mild Developmental Delayed. Report patient has a fascination with hospitals. Report cannot believe patient as she makes up stories. Report patient walks up to staff several times during the day expressing she has suicidal ideations. Report staff will ask patient what's going on, tell patient she does not qualify to go to the hospital, patient will walk away then return later with another SI ideation. Report patient cell phone had to be taken due to patient kept calling 911. Report patient once went home to visit her mother returned to the group home with a plastic knife threatening SI but responded if staff takes the knife I will not do it, patient voluntary gave up the plastic knife. Report patient used another patient cell phone yesterday dialed 911. Report group home did not know she called 911 until emergency personnel arrived. Report patient usually visit [facility] where they know her and her behaviors, patient went to [facility], and the hospital not experienced with the patient and her behaviors.  Stanton Kidney with [facility] contracted for safety on the patient return. Report the patient will be safe upon return to the group home.   Deposition: Patient does not meet inpatient criteria, discharge per Assunta Found, NP and Dr. Lucianne Muss

## 2017-01-24 ENCOUNTER — Emergency Department (HOSPITAL_COMMUNITY): Payer: Medicare Other

## 2017-01-24 ENCOUNTER — Emergency Department (HOSPITAL_COMMUNITY)
Admission: EM | Admit: 2017-01-24 | Discharge: 2017-01-26 | Disposition: A | Discharge status: Home / Self Care | Payer: Medicare Other | Attending: Emergency Medicine | Admitting: Emergency Medicine

## 2017-01-24 ENCOUNTER — Encounter (HOSPITAL_COMMUNITY): Payer: Self-pay | Admitting: Emergency Medicine

## 2017-01-24 DIAGNOSIS — J45909 Unspecified asthma, uncomplicated: Secondary | ICD-10-CM | POA: Diagnosis not present

## 2017-01-24 DIAGNOSIS — R45851 Suicidal ideations: Secondary | ICD-10-CM | POA: Diagnosis not present

## 2017-01-24 DIAGNOSIS — F70 Mild intellectual disabilities: Secondary | ICD-10-CM | POA: Diagnosis present

## 2017-01-24 DIAGNOSIS — J449 Chronic obstructive pulmonary disease, unspecified: Secondary | ICD-10-CM | POA: Insufficient documentation

## 2017-01-24 DIAGNOSIS — N3000 Acute cystitis without hematuria: Secondary | ICD-10-CM

## 2017-01-24 DIAGNOSIS — F259 Schizoaffective disorder, unspecified: Secondary | ICD-10-CM | POA: Insufficient documentation

## 2017-01-24 DIAGNOSIS — F329 Major depressive disorder, single episode, unspecified: Secondary | ICD-10-CM | POA: Insufficient documentation

## 2017-01-24 DIAGNOSIS — Z79899 Other long term (current) drug therapy: Secondary | ICD-10-CM | POA: Insufficient documentation

## 2017-01-24 DIAGNOSIS — K219 Gastro-esophageal reflux disease without esophagitis: Secondary | ICD-10-CM | POA: Diagnosis not present

## 2017-01-24 DIAGNOSIS — R531 Weakness: Secondary | ICD-10-CM | POA: Diagnosis present

## 2017-01-24 DIAGNOSIS — Z7982 Long term (current) use of aspirin: Secondary | ICD-10-CM | POA: Diagnosis not present

## 2017-01-24 DIAGNOSIS — F332 Major depressive disorder, recurrent severe without psychotic features: Secondary | ICD-10-CM | POA: Diagnosis present

## 2017-01-24 DIAGNOSIS — F603 Borderline personality disorder: Secondary | ICD-10-CM | POA: Diagnosis present

## 2017-01-24 LAB — COMPREHENSIVE METABOLIC PANEL
ALT: 37 U/L (ref 14–54)
ANION GAP: 8 (ref 5–15)
AST: 62 U/L — ABNORMAL HIGH (ref 15–41)
Albumin: 3.6 g/dL (ref 3.5–5.0)
Alkaline Phosphatase: 91 U/L (ref 38–126)
BUN: 11 mg/dL (ref 6–20)
CHLORIDE: 102 mmol/L (ref 101–111)
CO2: 30 mmol/L (ref 22–32)
CREATININE: 0.9 mg/dL (ref 0.44–1.00)
Calcium: 9.2 mg/dL (ref 8.9–10.3)
Glucose, Bld: 90 mg/dL (ref 65–99)
POTASSIUM: 3.7 mmol/L (ref 3.5–5.1)
Sodium: 140 mmol/L (ref 135–145)
Total Bilirubin: 0.5 mg/dL (ref 0.3–1.2)
Total Protein: 7.1 g/dL (ref 6.5–8.1)

## 2017-01-24 LAB — CBC WITH DIFFERENTIAL/PLATELET
BASOS ABS: 0.1 10*3/uL (ref 0.0–0.1)
BASOS PCT: 1 %
EOS PCT: 4 %
Eosinophils Absolute: 0.3 10*3/uL (ref 0.0–0.7)
HEMATOCRIT: 38.6 % (ref 36.0–46.0)
Hemoglobin: 12.4 g/dL (ref 12.0–15.0)
LYMPHS ABS: 2 10*3/uL (ref 0.7–4.0)
Lymphocytes Relative: 29 %
MCH: 32.6 pg (ref 26.0–34.0)
MCHC: 32.1 g/dL (ref 30.0–36.0)
MCV: 101.6 fL — AB (ref 78.0–100.0)
Monocytes Absolute: 1 10*3/uL (ref 0.1–1.0)
Monocytes Relative: 14 %
Neutro Abs: 3.7 10*3/uL (ref 1.7–7.7)
Neutrophils Relative %: 52 %
Platelets: 267 10*3/uL (ref 150–400)
RBC: 3.8 MIL/uL — ABNORMAL LOW (ref 3.87–5.11)
RDW: 13.8 % (ref 11.5–15.5)
WBC: 7.1 10*3/uL (ref 4.0–10.5)

## 2017-01-24 LAB — URINALYSIS, ROUTINE W REFLEX MICROSCOPIC
BILIRUBIN URINE: NEGATIVE
Glucose, UA: NEGATIVE mg/dL
HGB URINE DIPSTICK: NEGATIVE
KETONES UR: NEGATIVE mg/dL
NITRITE: NEGATIVE
PH: 7 (ref 5.0–8.0)
Protein, ur: NEGATIVE mg/dL
SPECIFIC GRAVITY, URINE: 1.014 (ref 1.005–1.030)

## 2017-01-24 LAB — RAPID URINE DRUG SCREEN, HOSP PERFORMED
Amphetamines: NOT DETECTED
BARBITURATES: NOT DETECTED
Benzodiazepines: NOT DETECTED
Cocaine: NOT DETECTED
OPIATES: NOT DETECTED
TETRAHYDROCANNABINOL: NOT DETECTED

## 2017-01-24 LAB — SALICYLATE LEVEL

## 2017-01-24 LAB — TROPONIN I: Troponin I: <0.03 ng/mL (ref <0.03)

## 2017-01-24 LAB — ETHANOL

## 2017-01-24 LAB — ACETAMINOPHEN LEVEL

## 2017-01-24 MED ORDER — DEXTROSE 5 % IV SOLN
1.0000 g | Freq: Once | INTRAVENOUS | Status: AC
  Administered 2017-01-24: 1 g via INTRAVENOUS
  Filled 2017-01-24: qty 10

## 2017-01-24 MED ORDER — ONDANSETRON HCL 4 MG PO TABS
4.0000 mg | ORAL_TABLET | Freq: Three times a day (TID) | ORAL | Status: DC | PRN
Start: 2017-01-24 — End: 2017-01-26

## 2017-01-24 MED ORDER — RISPERIDONE 1 MG PO TABS
3.0000 mg | ORAL_TABLET | Freq: Every day | ORAL | Status: DC
  Filled 2017-01-24: qty 1
  Filled 2017-01-24 (×2): qty 3
  Filled 2017-01-24: qty 1
  Filled 2017-01-24: qty 3

## 2017-01-24 MED ORDER — CLONAZEPAM 0.5 MG PO TABS
0.5000 mg | ORAL_TABLET | Freq: Two times a day (BID) | ORAL | Status: DC
  Administered 2017-01-24 – 2017-01-26 (×3): 0.5 mg via ORAL
  Filled 2017-01-24 (×3): qty 1

## 2017-01-24 MED ORDER — AMANTADINE HCL 100 MG PO CAPS
100.0000 mg | ORAL_CAPSULE | Freq: Two times a day (BID) | ORAL | Status: DC
  Administered 2017-01-25: 100 mg via ORAL
  Filled 2017-01-24 (×10): qty 1

## 2017-01-24 MED ORDER — SUCRALFATE 1 G PO TABS
1.0000 g | ORAL_TABLET | Freq: Three times a day (TID) | ORAL | Status: DC
  Administered 2017-01-24 – 2017-01-26 (×5): 1 g via ORAL
  Filled 2017-01-24 (×5): qty 1

## 2017-01-24 MED ORDER — FENOFIBRATE 160 MG PO TABS
160.0000 mg | ORAL_TABLET | Freq: Every day | ORAL | Status: DC
  Administered 2017-01-25: 160 mg via ORAL
  Filled 2017-01-24 (×5): qty 1

## 2017-01-24 MED ORDER — ACETAMINOPHEN 325 MG PO TABS
650.0000 mg | ORAL_TABLET | ORAL | Status: DC | PRN
  Administered 2017-01-24 – 2017-01-25 (×2): 650 mg via ORAL
  Filled 2017-01-24 (×2): qty 2

## 2017-01-24 MED ORDER — ASPIRIN EC 81 MG PO TBEC
81.0000 mg | DELAYED_RELEASE_TABLET | Freq: Every day | ORAL | Status: DC
  Administered 2017-01-24 – 2017-01-25 (×2): 81 mg via ORAL
  Filled 2017-01-24 (×2): qty 1

## 2017-01-24 MED ORDER — ALUM & MAG HYDROXIDE-SIMETH 200-200-20 MG/5ML PO SUSP: 30.0000 mL | Freq: Four times a day (QID) | ORAL | Status: DC | PRN

## 2017-01-24 MED ORDER — ESCITALOPRAM OXALATE 10 MG PO TABS
30.0000 mg | ORAL_TABLET | Freq: Every day | ORAL | Status: DC
  Administered 2017-01-25: 30 mg via ORAL
  Filled 2017-01-24 (×5): qty 3

## 2017-01-24 MED ORDER — DIVALPROEX SODIUM 250 MG PO DR TAB
500.0000 mg | DELAYED_RELEASE_TABLET | Freq: Two times a day (BID) | ORAL | Status: DC
  Administered 2017-01-24 – 2017-01-26 (×3): 500 mg via ORAL
  Filled 2017-01-24 (×3): qty 2

## 2017-01-24 MED ORDER — SODIUM CHLORIDE 0.9 % IV BOLUS (SEPSIS)
500.0000 mL | Freq: Once | INTRAVENOUS | Status: AC
  Administered 2017-01-24: 500 mL via INTRAVENOUS

## 2017-01-24 MED ORDER — CEPHALEXIN 500 MG PO CAPS
500.0000 mg | ORAL_CAPSULE | Freq: Two times a day (BID) | ORAL | Status: DC
  Administered 2017-01-24 – 2017-01-26 (×3): 500 mg via ORAL
  Filled 2017-01-24 (×3): qty 1

## 2017-01-24 MED ORDER — PANTOPRAZOLE SODIUM 40 MG PO TBEC
40.0000 mg | DELAYED_RELEASE_TABLET | Freq: Every day | ORAL | Status: DC
  Administered 2017-01-24 – 2017-01-25 (×2): 40 mg via ORAL
  Filled 2017-01-24 (×2): qty 1

## 2017-01-24 NOTE — BH Assessment (Addendum)
Tele Assessment Note   Patient Name: [name] MRN: [id number] Referring Physician: Delford Field Location of Patient:  Location of Provider: Behavioral Health TTS Department  [name] is an 51 y.o. female who voluntarily presented to [facility] via EMS with complaints of experiencing weakness, nausea, and chronic back pain.  In addition pt reported feeling suicidal.  During [facility] tele-assessment, pt reported feelings of depression, wanting to harm herself, and suicidal ideation with a plan of jumping into traffic or taking a bottle of pills.  Pt also expressed wanting to harm a resident at her group home but denies plans of taking actions.  Pt admitted to A/V hallucinations and reported seeing a dog smoking while watching t.v. with her fianc.  Pt cannot contract for safety.  Pt stated "I will hurt myself if I go back to the group home but I will be safe here ([facility])."  Patient has an extensive history of ED treatment for SI.  Patient was last assessed by TTS on 01/17/17 for SI and treated inpatient at [facility] April 2018 for suicide attempt when she overdose on Tylenol. Pt reported seeing Dr. Concha Pyo but denies receiving outpatient treatment for her depression.  Pt reported her depression goes back to 1993 when her father died.   Pt reported having family support with her mother, brother, and sister in law. Pt expressed a desire for addition psychiatric support.     Pt reported residing at [facility] and states that she can return at discharge but pt stated she does not want to go back there.  Pt was dressed in hospital scrubs laying in the bed during the assessment.  Pt was alert, oriented x3 with normal speech and showed no agitation in her motor behavior. Pt's eye contact was good. Pt expressed logical, coherent and relevant thought processes during the assessment. Pt does appears depressed and preoccupied with leaving her group home. Pt's behavior was congruent with her expressed  mood. There is no evidence that Pt was responding to internal stimuli or experiencing delusional thought content. Pt was cooperative throughout assessment.   Disposition:  Per Donell Sievert, PA recommendation is overnight observation for safety and stability with possible AM discharge back to group home after re-evaluation and final disposition. LPCA relayed recommended disposition to pt's nurse, Boyd Kerbs, RN and Dr. Alona Bene, MD both acknowledged recommendation,    Diagnosis: Schizoaffective Disorder, Borderline Personality Disorder, Depression,   Past Medical History:  Past Medical History:  Diagnosis Date  . Anxiety   . Asthma   . Borderline personality disorder (HCC)   . Cerebral palsy (HCC)   . Depression   . GERD (gastroesophageal reflux disease)   . Mild cognitive impairment   . Schizoaffective disorder (HCC)   . Wound abscess     Past Surgical History:  Procedure Laterality Date  . CHOLECYSTECTOMY    . INCISION AND DRAINAGE ABSCESS Right 01/02/2015   Procedure: INCISION AND DRAINAGE ABSCESS;  Surgeon: Tiney Rouge III, MD;  Location: [facility] ORS;  Service: General;  Laterality: Right;  . TONSILLECTOMY      Family History:  Family History  Problem Relation Age of Onset  . Heart attack Father   . Diabetes Mother     Social History:  reports that she has never smoked. She has never used smokeless tobacco. She reports that she does not drink alcohol or use drugs.  Additional Social History:  Alcohol / Drug Use Pain Medications: See MARs Prescriptions: See MARs Over the  Counter: Pt denies History of alcohol / drug use?: No history of alcohol / drug abuse  CIWA: CIWA-Ar BP: 106/70 Pulse Rate: 69 COWS:    PATIENT STRENGTHS: (choose at least two) Communication skills Motivation for treatment/growth Supportive family/friends  Allergies:  Allergies  Allergen Reactions  . Penicillins Nausea And Vomiting and Other (See Comments)    Reaction:  No  Has  patient had a PCN reaction causing immediate rash, facial/tongue/throat swelling, SOB or lightheadedness with hypotension: No Has patient had a PCN reaction causing severe rash involving mucus membranes or skin necrosis: No Has patient had a PCN reaction that required hospitalization No Has patient had a PCN reaction occurring within the last 10 years: No If all of the above answers are "NO", then may proceed with Cephalosporin use.     Home Medications:  (Not in a hospital admission)  OB/GYN Status:  No LMP recorded. Patient has had an implant.  General Assessment Data Location of Assessment: [facility] TTS Assessment: In system Is this a Tele or Face-to-Face Assessment?: Tele Assessment Is this an Initial Assessment or a Re-assessment for this encounter?: Initial Assessment Marital status: Single (Pt states she is engaged) Is patient pregnant?: No Pregnancy Status: Unknown Living Arrangements: Group Home (Pt lives in [facility]) Can pt return to current living arrangement?: Yes (According to pt) Admission Status: Voluntary Is patient capable of signing voluntary admission?: Yes Referral Source: Self/Family/Friend Insurance type: [facility] MCR     Crisis Care Plan Living Arrangements: Group Home (Pt lives in [facility]) Name of Psychiatrist: Dr Omelia Blackwater (Pt states she has not seen dr since he left) Name of Therapist: None reported  Education Status Is patient currently in school?: No  Risk to self with the past 6 months Suicidal Ideation: Yes-Currently Present (Pt states she will hurt herself if she return home) Has patient been a risk to self within the past 6 months prior to admission? : Yes Suicidal Intent: Yes-Currently Present (Pt reports wanting to run in traffic) Has patient had any suicidal intent within the past 6 months prior to admission? : Yes (Pt tried to OD April 2018) Is patient at risk for suicide?: Yes Suicidal Plan?: Yes-Currently Present Has  patient had any suicidal plan within the past 6 months prior to admission? : Yes Specify Current Suicidal Plan: Pt plans to run in traffic or take a bottle of pills Access to Means: No (Pt denies having access to weapons in group home) What has been your use of drugs/alcohol within the last 12 months?: denies Previous Attempts/Gestures: Yes (Pt took bottle of tylenol April 2018) How many times?:  (Multiple) Triggers for Past Attempts: Unpredictable Intentional Self Injurious Behavior: None (Pt denies but states she felt like hurting herself) Family Suicide History: Unknown Recent stressful life event(s): Conflict (Comment) (conflict with other resident at group home) Persecutory voices/beliefs?: No Depression: Yes Depression Symptoms: Insomnia, Feeling angry/irritable, Fatigue, Despondent Substance abuse history and/or treatment for substance abuse?: No Suicide prevention information given to non-admitted patients: Not applicable  Risk to Others within the past 6 months Homicidal Ideation: No-Not Currently/Within Last 6 Months Does patient have any lifetime risk of violence toward others beyond the six months prior to admission? : No Thoughts of Harm to Others: Yes-Currently Present Comment - Thoughts of Harm to Others: Wants to hurt the resident  Rosey Bath in her group home Current Homicidal Intent: No Current Homicidal Plan: No Access to Homicidal Means: No History of harm to others?: No  Assessment of Violence: None Noted Does patient have access to weapons?: No Criminal Charges Pending?: No Does patient have a court date: No Is patient on probation?: No  Psychosis Hallucinations: Auditory, Visual (Pt reports seeing a dog smoking on TV) Delusions: None noted  Mental Status Report Appearance/Hygiene: Unremarkable Eye Contact: Fair Motor Activity: Freedom of movement Speech: Logical/coherent Level of Consciousness: Alert Mood: Depressed, Preoccupied Affect: Appropriate to  circumstance Anxiety Level: None Thought Processes: Coherent, Relevant Judgement: Impaired Orientation: Person, Place, Situation, Appropriate for developmental age Obsessive Compulsive Thoughts/Behaviors: None  Cognitive Functioning Concentration: Normal Memory: Recent Intact IQ: Below Average (No IQ score documented) Level of Function: Mild I/DD Insight: Poor Impulse Control: Good Appetite: Poor (Pt states she has not wanted to eat) Sleep: Decreased (Pt reports 1-2 hrs per night) Total Hours of Sleep: 2 Vegetative Symptoms: None  ADLScreening [facility] Assessment Services) Patient's cognitive ability adequate to safely complete daily activities?: Yes Patient able to express need for assistance with ADLs?: Yes Independently performs ADLs?: Yes (appropriate for developmental age)  Prior Inpatient Therapy Prior Inpatient Therapy: Yes Prior Therapy Dates: Multiple Prior Therapy Facilty/Provider(s): various Reason for Treatment: SI, overdose, depressive symptoms  Prior Outpatient Therapy Prior Outpatient Therapy: No (Pt was able to state provider but denies tx) Prior Therapy Facilty/Provider(s): Reportedly Dr Omelia Blackwater sees pt for medication management Reason for Treatment: Depression, Schizoaffective Disorder, BPD Does patient have an ACCT team?: No Does patient have Intensive In-House Services?  : No Does patient have [facility] services? : No Does patient have P4CC services?: No  ADL Screening (condition at time of admission) Patient's cognitive ability adequate to safely complete daily activities?: Yes Patient able to express need for assistance with ADLs?: Yes Independently performs ADLs?: Yes (appropriate for developmental age)       Abuse/Neglect Assessment (Assessment to be complete while patient is alone) Physical Abuse: Denies Verbal Abuse: Denies Sexual Abuse: Denies Exploitation of patient/patient's resources: Denies Self-Neglect: Denies     Merchant navy officer (For  Healthcare) Does Patient Have a Medical Advance Directive?: No Would patient like information on creating a medical advance directive?: No - Patient declined    Additional Information 1:1 In Past 12 Months?: No CIRT Risk: No Elopement Risk: No     Disposition:  Per Donell Sievert, PA recommendation is overnight observation for safety and stability with possible AM discharge back to group home after re-evaluation and final disposition. LPCA relayed recommended disposition to pt's nurse, Boyd Kerbs, RN and Dr. Alona Bene, MD both acknowledged recommendation,  Disposition Initial Assessment Completed for this Encounter: Yes Disposition of Patient: Other dispositions (Pending review with [facility] extender)  This service was provided via telemedicine using a 2-way, interactive audio and video technology.  Names of all persons participating in this telemedicine service and their role in this encounter. Name: [name] Role:  Patient  Name:  Role:   Name:  Role:   Name:  Role:       Jyrah Blye L Zabian Swayne 01/24/2017 9:20 PM

## 2017-01-24 NOTE — ED Triage Notes (Signed)
Per EMS pt states that she has been experiencing weakness and nausea since yesterday.  Pt is experiencing and back pain chronically.  Pt was able to ambulate to truck.  Pt states that she feels suicidal.  BP 126/61 HR 70 RR 16 CBG 92

## 2017-01-24 NOTE — ED Provider Notes (Signed)
Emergency Department Provider Note   I have reviewed the triage vital signs and the nursing notes.   HISTORY  Chief Complaint V70.1   HPI [name] is a 51 y.o. female with PMH of CP, anxiety, GERD, and Schizoaffective disorder as to the emergency department for evaluation by EMS for generalized weakness with nausea starting yesterday. Patient states that she has had some associated chronic back pain and chest pain which did not worsen significantly. He is also describing some increased depression and suicidal thinking with a plan to step out of front of traffic. She states that she is thinking more about family members who've passed away and she is thinking about joining them. She notes that this time of year she begins to feel more depressed and suicidal. She has a Takayla Baillie history of psychiatry illness and multiple prior suicide attempts. She also endorses some auditory and visual hallucinations. She states that she is hearing nondistinctive voices and seeing smoke in her room or people walking in front of her. She has been also having some thoughts of hurting a particular member of her group home but decided not to act on this.  In terms of the patient's generalized weakness she describes feeling intermittently lightheaded with no new chest pain, shortness of breath, palpitations. She denies any unilateral weakness or numbness. No fevers or chills. She reports occasional dysuria but none recently. No new medications. She is currently living in a group home and taking her medications as prescribed. Denies any illicit drug use or alcohol. No falls or head trauma.    Past Medical History:  Diagnosis Date  . Anxiety   . Asthma   . Borderline personality disorder (HCC)   . Cerebral palsy (HCC)   . Depression   . GERD (gastroesophageal reflux disease)   . Mild cognitive impairment   . Schizoaffective disorder (HCC)   . Wound abscess     Patient Active Problem List   Diagnosis  Date Noted  . Severe recurrent major depression without psychotic features (HCC) 08/10/2016  . Overdose by acetaminophen 08/08/2016  . Schizoaffective disorder, bipolar type (HCC)   . 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 GERD (gastroesophageal reflux disease) 09/24/2014  . COPD (chronic obstructive pulmonary disease) (HCC) 09/24/2014  . Borderline personality disorder (HCC) 09/24/2014  . Cerebral palsy (HCC) 08/31/2014    Past Surgical History:  Procedure Laterality Date  . CHOLECYSTECTOMY    . INCISION AND DRAINAGE ABSCESS Right 01/02/2015   Procedure: INCISION AND DRAINAGE ABSCESS;  Surgeon: Tiney Rouge III, MD;  Location: [facility] ORS;  Service: General;  Laterality: Right;  . TONSILLECTOMY      Current Outpatient Rx  . Order #: [id number] Class: Print  . Order #: [id number] Class: Historical Med  . Order #: [id number] Class: Historical Med  . Order #: [id number] Class: Historical Med  . Order #: [id number] Class: Historical Med  . Order #: [id number] Class: Historical Med  . Order #: [id number] Class: Historical Med  . Order #: [id number] Class: Historical Med  . Order #: [id number] Class: Historical Med  . Order #: [id number] Class: Historical Med  . Order #: [id number] Class: Print  . Order #: [id number] Class: Historical Med    Allergies Penicillins  Family History  Problem Relation Age of Onset  . Heart attack Father   . Diabetes Mother     Social History Social History  Substance Use Topics  . Smoking status: Never Smoker  . Smokeless tobacco: Never Used  . Alcohol use No    Review of Systems  Constitutional: No  fever/chills. Positive generalized weakness.  Eyes: No visual changes. ENT: No sore throat. Cardiovascular: Positive chronic chest pain. Respiratory: Positive intermittent shortness of breath. Gastrointestinal: No abdominal pain. Positive nausea, no vomiting.  No diarrhea.  No constipation. Genitourinary: Positive for occasional dysuria. Musculoskeletal: Negative  for back pain. Skin: Negative for rash. Neurological: Negative for headaches, focal weakness or numbness.  10-point ROS otherwise negative.  ____________________________________________   PHYSICAL EXAM:  VITAL SIGNS: ED Triage Vitals  Enc Vitals Group     BP 01/24/17 1646 118/78     Pulse Rate 01/24/17 1646 67     Resp 01/24/17 1646 16     Temp 01/24/17 1646 98.5 F (36.9 C)     Temp Source 01/24/17 1646 Oral     SpO2 01/24/17 1644 97 %     Weight 01/24/17 1647 193 lb (87.5 kg)     Height 01/24/17 1647  (1.6 m)     Pain Score 01/24/17 1644 10   Constitutional: Alert and oriented. Well appearing and in no acute distress. Eyes: Conjunctivae are normal. PERRL. EOMI. Head: Atraumatic. Nose: No congestion/rhinnorhea. Mouth/Throat: Mucous membranes are slightly dry.  Neck: No stridor.   Cardiovascular: Normal rate, regular rhythm. Good peripheral circulation. Grossly normal heart sounds.   Respiratory: Normal respiratory effort.  No retractions. Lungs CTAB. Gastrointestinal: Soft and nontender. No distention.  Musculoskeletal: No lower extremity tenderness nor edema. No gross deformities of extremities. Neurologic:  Normal speech and language. No gross focal neurologic deficits are appreciated.  Skin:  Skin is warm, dry and intact. No rash noted.  ____________________________________________   LABS (all labs ordered are listed, but only abnormal results are displayed)  Labs Reviewed  COMPREHENSIVE METABOLIC PANEL - Abnormal; Notable for the following:       Result Value   AST 62 (*)    All other components within normal limits  ACETAMINOPHEN LEVEL - Abnormal; Notable for the following:    Acetaminophen (Tylenol), Serum <10 (*)    All other components within normal limits  CBC WITH DIFFERENTIAL/PLATELET - Abnormal; Notable for the following:    RBC 3.80 (*)    MCV 101.6 (*)    All other components within normal limits  URINALYSIS, ROUTINE W REFLEX MICROSCOPIC -  Abnormal; Notable for the following:    APPearance HAZY (*)    Leukocytes, UA LARGE (*)    Bacteria, UA RARE (*)    Squamous Epithelial / LPF 0-5 (*)    All other components within normal limits  URINE CULTURE  ETHANOL  SALICYLATE LEVEL  TROPONIN I  RAPID URINE DRUG SCREEN, HOSP PERFORMED   ____________________________________________  EKG   EKG Interpretation  Date/Time:  Wednesday January 24 2017 17:23:38 EDT Ventricular Rate:  70 PR Interval:    QRS Duration: 91 QT Interval:  439 QTC Calculation: 474 R Axis:   49 Text Interpretation:  Sinus rhythm Multiple ventricular premature complexes Low voltage, precordial leads Nonspecific T abnormalities, anterior leads No STEMI.  Confirmed by Alona Bene [phone]) on 01/24/2017 5:36:35 PM       ____________________________________________  RADIOLOGY  Dg Chest 2 View  Result Date: 01/24/2017 CLINICAL DATA:  Chest pain and dyspnea. EXAM: CHEST  2 VIEW COMPARISON:  01/17/2017 FINDINGS: The lungs are clear. The pulmonary vasculature is normal. Heart size is normal. Hilar and mediastinal contours are unremarkable. There is no pleural effusion. IMPRESSION: No active cardiopulmonary disease. Electronically Signed   By: Ellery Plunk M.D.   On: 01/24/2017 18:09    ____________________________________________  PROCEDURES  Procedure(s) performed:   Procedures  None ____________________________________________   INITIAL IMPRESSION / ASSESSMENT AND PLAN / ED COURSE  Pertinent labs & imaging results that were available during my care of the patient were reviewed by me and considered in my medical decision making (see chart for details).  Patient resents to the emergency department for evaluation of generalized weakness and nausea with some associated worsening depression and suicidal thinking. She has a plan to step out in front of traffic. Patient with past history of multiple suicide attempts and psychiatric  hospitalizations. He can comply with medications. Denies intentional overdose today. She is well appearing with no focal neurological deficits. No infection signs or symptoms. Plan for labs, IV fluids, chest x-ray with intermittent dyspnea, EKG, and reassessment for medical clearance and psychiatry evaluation.  06:54 PM Patient not have a urinary tract infection on UA. Remaining labs are normal. No evidence of developing urosepsis. This may explain her weakness. Plan for single dose of IV Rocephin followed by Keflex for the next week. I restarted the patient's home psychiatry medications and consider her to be medically cleared for psychiatry evaluation at this time.   10:16 PM TTS recommends AM psych eval to determine final disposition.  ____________________________________________  FINAL CLINICAL IMPRESSION(S) / ED DIAGNOSES  Final diagnoses:  Suicidal ideation  Acute cystitis without hematuria  Generalized weakness     MEDICATIONS GIVEN DURING THIS VISIT:  Medications  ondansetron (ZOFRAN) tablet 4 mg (not administered)  acetaminophen (TYLENOL) tablet 650 mg (650 mg Oral Given 01/24/17 1925)  alum & mag hydroxide-simeth (MAALOX/MYLANTA) 200-200-20 MG/5ML suspension 30 mL (not administered)  amantadine (SYMMETREL) capsule 100 mg (not administered)  aspirin EC tablet 81 mg (81 mg Oral Given 01/24/17 1926)  clonazePAM (KLONOPIN) tablet 0.5 mg (not administered)  divalproex (DEPAKOTE) DR tablet 500 mg (not administered)  escitalopram (LEXAPRO) tablet 30 mg (not administered)  fenofibrate tablet 160 mg (not administered)  pantoprazole (PROTONIX) EC tablet 40 mg (40 mg Oral Given 01/24/17 1935)  risperiDONE (RISPERDAL) tablet 3 mg (not administered)  sucralfate (CARAFATE) tablet 1 g (not administered)  cephALEXin (KEFLEX) capsule 500 mg (not administered)  sodium chloride 0.9 % bolus 500 mL (0 mLs Intravenous Stopped 01/24/17 1924)  cefTRIAXone (ROCEPHIN) 1 g in dextrose 5 % 50 mL  IVPB (0 g Intravenous Stopped 01/24/17 1957)     NEW OUTPATIENT MEDICATIONS STARTED DURING THIS VISIT:  Will need Keflex Rx to complete home UTI treatment at time of discharge. Psych eval pending.   Note:  This document was prepared using Dragon voice recognition software and may include unintentional dictation errors.  Alona Bene, MD Emergency Medicine     Cadden Elizondo, Arlyss Repress, MD 01/24/17 2217

## 2017-01-24 NOTE — ED Notes (Signed)
Patient is on monitor until test results are back.  Sitter is monitoring cords.

## 2017-01-24 NOTE — ED Notes (Signed)
Pt states that she has a plan to overdose and walk into traffic.

## 2017-01-25 ENCOUNTER — Encounter (HOSPITAL_COMMUNITY): Payer: Self-pay | Admitting: Registered Nurse

## 2017-01-25 DIAGNOSIS — F332 Major depressive disorder, recurrent severe without psychotic features: Secondary | ICD-10-CM

## 2017-01-25 DIAGNOSIS — F329 Major depressive disorder, single episode, unspecified: Secondary | ICD-10-CM | POA: Diagnosis not present

## 2017-01-25 DIAGNOSIS — F603 Borderline personality disorder: Secondary | ICD-10-CM

## 2017-01-25 DIAGNOSIS — R45851 Suicidal ideations: Secondary | ICD-10-CM

## 2017-01-25 MED ORDER — CEPHALEXIN 500 MG PO CAPS: 500.0000 mg | ORAL_CAPSULE | Freq: Three times a day (TID) | ORAL | 0 refills | Status: DC
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
## 2017-01-25 NOTE — ED Notes (Signed)
Pt being re evaled by [facility] at this time

## 2017-01-25 NOTE — Progress Notes (Signed)
Sherri Rankin, NP, the patient does not meet criteria for inpatient treatment. The patient is recommended for discharge and to follow up with her outpatient providers.  Psych Cleared  Patient is a resident at [facility] [phone]).  CSW spoke with the patient's group home supervisor, [name] [phone]) to notify her that the patient would be discharging today. Per [name], she will notify the Group Home administrator to come and pick the patient up.  Per [name], the patient is currently being set up with an outpatient provider by the group home's administrator.   CSW recommends the patient go to [facility]- until she is set up with an assigned outpatient provider, if she is disregard the recommendation to follow up with.  [name], RN notified.    Baldo Daub MSW, LCSWA CSW Disposition [phone]

## 2017-01-25 NOTE — ED Notes (Signed)
Pt now c/o leg pain and requesting tylenol for same.

## 2017-01-25 NOTE — Progress Notes (Signed)
CSW attempted to contact [facility] at number given in previous notes [phone]), received no answer.  Also called Ms. Jimmye Norman, [phone]) who is listed as the owner of the [facility] in [place]..  CSW left a HIPAA compliant voice mail asking [facility] owner to return call.  Timmothy Euler. Kaylyn Lim, MSW, LCSWA Disposition Clinical Social Work [phone] (cell) [phone] (office)

## 2017-01-25 NOTE — Consult Note (Signed)
Telepsych Consultation   Reason for Consult:  Suicidal ideation Referring Physician: Margette Fast, MD Location of Patient: [facility] Location of Provider: [facility]  [name] MRN:  [id number] Principal Diagnosis: Severe recurrent major depression without psychotic features [facility]) Diagnosis:   Patient Active Problem List   Diagnosis Date Noted  . Severe recurrent major depression without psychotic features ([place]) [F33.2] 08/10/2016  . Overdose by acetaminophen [T39.1X1A] 08/08/2016  . Schizoaffective disorder, bipolar type ([place]) [F25.0]   . 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 GERD (gastroesophageal reflux disease) [K21.9] 09/24/2014  . COPD (chronic obstructive pulmonary disease) ([place]) [J44.9] 09/24/2014  . Borderline personality disorder ([place]) [F60.3] 09/24/2014  . Cerebral palsy ([place]) [G80.9] 08/31/2014    Total Time spent with patient: 45 minutes  Subjective:   [name] is a 51 y.o. female presented to [facility] with complaints of depression and feeling suicidal.  HPI:  [name], 51 y.o., female patient seen via telepsych by this provider on 01/25/17.  Chart reviewed and consulted with Dr. Dwyane Dee.  On evaluation [name] reports that she came to the hospital because she was feeling suicidal and wanted to walk into traffic.  Patient states that she was seeing smoke that was not there at the group home.  Patient reports that her group home is not taking her to see her psychiatrist.  States that she doesn't feel that her medication is working.  At this time patient is feeling better; she is able to contract for safety.  Patient denies homicidal ideation, psychosis, and paranoia Spoke with Hilda Blades Soil scientist in Diplomatic Services operational officer) at [facility] and was informed that patient has a chronic history of suicidal complaints and has been taking to the ER on multiple occassions.  States that patient was seeing Dr Rosine Door for outpatient  services but is in the process of transferring to a new psychiatrist and the administrator has been working on it.  Patient has been taking her medications.  States that they also try to keep the phone or monitor phone calls because patient will call 911 to just come to the hospital. States that she feels that the patient will be safe at home and patient can come back to group home.  Informed Hilda Blades that we would also be sending outpatient resources for the patient and that walk in could be done at [facility] or [facility] in [place] if needed to see a psychiatrist sooner than an appointment can get patient in.    Past Psychiatric History: Major depression, borderline personality, schizoaffective.  Multiple presentations to ER with similar complaints of suicidal ideation (passive; no prior attempt; base line)  Risk to Self: Suicidal Ideation: Yes-Currently Present (Pt states she will hurt herself if she return home) Suicidal Intent: Yes-Currently Present (Pt reports wanting to run in traffic) Is patient at risk for suicide?: Yes Suicidal Plan?: Yes-Currently Present Specify Current Suicidal Plan: Pt plans to run in traffic or take a bottle of pills Access to Means: No (Pt denies having access to weapons in group home) What has been your use of drugs/alcohol within the last 12 months?: denies How many times?:  (Multiple) Triggers for Past Attempts: Unpredictable Intentional Self Injurious Behavior: None (Pt denies but states she felt like hurting herself) Risk to Others: Homicidal Ideation: No-Not Currently/Within Last 6 Months Thoughts of Harm to Others: Yes-Currently Present Comment - Thoughts of Harm to Others: Wants to hurt the resident  [name] in her group home Current Homicidal Intent: No Current Homicidal  Plan: No Access to Homicidal Means: No History of harm to others?: No Assessment of Violence: None Noted Does patient have access to weapons?: No Criminal Charges  Pending?: No Does patient have a court date: No Prior Inpatient Therapy: Prior Inpatient Therapy: Yes Prior Therapy Dates: Multiple Prior Therapy Facilty/Provider(s): various Reason for Treatment: SI, overdose, depressive symptoms Prior Outpatient Therapy: Prior Outpatient Therapy: No (Pt was able to state provider but denies tx) Prior Therapy Facilty/Provider(s): Reportedly Dr Rosine Door sees pt for medication management Reason for Treatment: Depression, Schizoaffective Disorder, BPD Does patient have an ACCT team?: No Does patient have Intensive In-House Services?  : No Does patient have [facility] services? : No Does patient have P4CC services?: No  Past Medical History:  Past Medical History:  Diagnosis Date  . Anxiety   . Asthma   . Borderline personality disorder ([place])   . Cerebral palsy ([place])   . Depression   . GERD (gastroesophageal reflux disease)   . Mild cognitive impairment   . Schizoaffective disorder ([place])   . Wound abscess     Past Surgical History:  Procedure Laterality Date  . CHOLECYSTECTOMY    . INCISION AND DRAINAGE ABSCESS Right 01/02/2015   Procedure: INCISION AND DRAINAGE ABSCESS;  Surgeon: Dia Crawford III, MD;  Location: [facility] ORS;  Service: General;  Laterality: Right;  . TONSILLECTOMY     Family History:  Family History  Problem Relation Age of Onset  . Heart attack Father   . Diabetes Mother    Family Psychiatric  History: Unaware Social History:  History  Alcohol Use No     History  Drug Use No    Social History   Social History  . Marital status: Single    Spouse name: N/A  . Number of children: N/A  . Years of education: N/A   Social History Main Topics  . Smoking status: Never Smoker  . Smokeless tobacco: Never Used  . Alcohol use No  . Drug use: No  . Sexual activity: Not Currently    Birth control/ protection: Implant   Other Topics Concern  . None   Social History Narrative  . None   Additional Social History:     Allergies:   Allergies  Allergen Reactions  . Penicillins Nausea And Vomiting and Other (See Comments)    Reaction:  No  Has patient had a PCN reaction causing immediate rash, facial/tongue/throat swelling, SOB or lightheadedness with hypotension: No Has patient had a PCN reaction causing severe rash involving mucus membranes or skin necrosis: No Has patient had a PCN reaction that required hospitalization No Has patient had a PCN reaction occurring within the last 10 years: No If all of the above answers are "NO", then may proceed with Cephalosporin use.     Labs:  Results for orders placed or performed during the hospital encounter of 01/24/17 (from the past 48 hour(s))  Urinalysis, Routine w reflex microscopic     Status: Abnormal   Collection Time: 01/24/17  5:01 PM  Result Value Ref Range   Color, Urine YELLOW YELLOW   APPearance HAZY (A) CLEAR   Specific Gravity, Urine 1.014 1.005 - 1.030   pH 7.0 5.0 - 8.0   Glucose, UA NEGATIVE NEGATIVE mg/dL   Hgb urine dipstick NEGATIVE NEGATIVE   Bilirubin Urine NEGATIVE NEGATIVE   Ketones, ur NEGATIVE NEGATIVE mg/dL   Protein, ur NEGATIVE NEGATIVE mg/dL   Nitrite NEGATIVE NEGATIVE   Leukocytes, UA LARGE (A) NEGATIVE   RBC / HPF  6-30 0 - 5 RBC/hpf   WBC, UA TOO NUMEROUS TO COUNT 0 - 5 WBC/hpf   Bacteria, UA RARE (A) NONE SEEN   Squamous Epithelial / LPF 0-5 (A) NONE SEEN   Mucus PRESENT   Urine rapid drug screen (hosp performed)     Status: None   Collection Time: 01/24/17  5:01 PM  Result Value Ref Range   Opiates NONE DETECTED NONE DETECTED   Cocaine NONE DETECTED NONE DETECTED   Benzodiazepines NONE DETECTED NONE DETECTED   Amphetamines NONE DETECTED NONE DETECTED   Tetrahydrocannabinol NONE DETECTED NONE DETECTED   Barbiturates NONE DETECTED NONE DETECTED  Comprehensive metabolic panel     Status: Abnormal   Collection Time: 01/24/17  5:09 PM  Result Value Ref Range   Sodium 140 135 - 145 mmol/L   Potassium 3.7 3.5 -  5.1 mmol/L   Chloride 102 101 - 111 mmol/L   CO2 30 22 - 32 mmol/L   Glucose, Bld 90 65 - 99 mg/dL   BUN 11 6 - 20 mg/dL   Creatinine, Ser 0.90 0.44 - 1.00 mg/dL   Calcium 9.2 8.9 - 10.3 mg/dL   Total Protein 7.1 6.5 - 8.1 g/dL   Albumin 3.6 3.5 - 5.0 g/dL   AST 62 (H) 15 - 41 U/L   ALT 37 14 - 54 U/L   Alkaline Phosphatase 91 38 - 126 U/L   Total Bilirubin 0.5 0.3 - 1.2 mg/dL   GFR calc non Af Amer >60 >60 mL/min   GFR calc Af Amer >60 >60 mL/min    Comment: (NOTE) The eGFR has been calculated using the CKD EPI equation. This calculation has not been validated in all clinical situations. eGFR's persistently <60 mL/min signify possible Chronic Kidney Disease.    Anion gap 8 5 - 15  Troponin I     Status: None   Collection Time: 01/24/17  5:09 PM  Result Value Ref Range   Troponin I <0.03 <0.03 ng/mL  CBC with Differential     Status: Abnormal   Collection Time: 01/24/17  5:09 PM  Result Value Ref Range   WBC 7.1 4.0 - 10.5 K/uL   RBC 3.80 (L) 3.87 - 5.11 MIL/uL   Hemoglobin 12.4 12.0 - 15.0 g/dL   HCT 38.6 36.0 - 46.0 %   MCV 101.6 (H) 78.0 - 100.0 fL   MCH 32.6 26.0 - 34.0 pg   MCHC 32.1 30.0 - 36.0 g/dL   RDW 13.8 11.5 - 15.5 %   Platelets 267 150 - 400 K/uL   Neutrophils Relative % 52 %   Neutro Abs 3.7 1.7 - 7.7 K/uL   Lymphocytes Relative 29 %   Lymphs Abs 2.0 0.7 - 4.0 K/uL   Monocytes Relative 14 %   Monocytes Absolute 1.0 0.1 - 1.0 K/uL   Eosinophils Relative 4 %   Eosinophils Absolute 0.3 0.0 - 0.7 K/uL   Basophils Relative 1 %   Basophils Absolute 0.1 0.0 - 0.1 K/uL  Ethanol     Status: None   Collection Time: 01/24/17  5:15 PM  Result Value Ref Range   Alcohol, Ethyl (B) <10 <10 mg/dL    Comment:        LOWEST DETECTABLE LIMIT FOR SERUM ALCOHOL IS 10 mg/dL FOR MEDICAL PURPOSES ONLY   Acetaminophen level     Status: Abnormal   Collection Time: 01/24/17  5:15 PM  Result Value Ref Range   Acetaminophen (Tylenol), Serum <10 (L) 10 - 30  ug/mL     Comment:        THERAPEUTIC CONCENTRATIONS VARY SIGNIFICANTLY. A RANGE OF 10-30 ug/mL MAY BE AN EFFECTIVE CONCENTRATION FOR MANY PATIENTS. HOWEVER, SOME ARE BEST TREATED AT CONCENTRATIONS OUTSIDE THIS RANGE. ACETAMINOPHEN CONCENTRATIONS >150 ug/mL AT 4 HOURS AFTER INGESTION AND >50 ug/mL AT 12 HOURS AFTER INGESTION ARE OFTEN ASSOCIATED WITH TOXIC REACTIONS.   Salicylate level     Status: None   Collection Time: 01/24/17  5:15 PM  Result Value Ref Range   Salicylate Lvl <8.7 2.8 - 30.0 mg/dL    Medications:  Current Facility-Administered Medications  Medication Dose Route Frequency Provider Last Rate Last Dose  . acetaminophen (TYLENOL) tablet 650 mg  650 mg Oral Q4H PRN Long, Wonda Olds, MD   650 mg at 01/24/17 1925  . alum & mag hydroxide-simeth (MAALOX/MYLANTA) 200-200-20 MG/5ML suspension 30 mL  30 mL Oral Q6H PRN Long, Wonda Olds, MD      . amantadine (SYMMETREL) capsule 100 mg  100 mg Oral BID Long, Wonda Olds, MD   100 mg at 01/25/17 1040  . aspirin EC tablet 81 mg  81 mg Oral Daily Long, Wonda Olds, MD   81 mg at 01/25/17 1039  . cephALEXin (KEFLEX) capsule 500 mg  500 mg Oral Q12H Long, Wonda Olds, MD   500 mg at 01/25/17 1042  . clonazePAM (KLONOPIN) tablet 0.5 mg  0.5 mg Oral BID Long, Wonda Olds, MD   0.5 mg at 01/25/17 1039  . divalproex (DEPAKOTE) DR tablet 500 mg  500 mg Oral BID Long, Wonda Olds, MD   500 mg at 01/25/17 1040  . escitalopram (LEXAPRO) tablet 30 mg  30 mg Oral Daily Long, Wonda Olds, MD   30 mg at 01/25/17 1041  . fenofibrate tablet 160 mg  160 mg Oral Daily Long, Wonda Olds, MD   160 mg at 01/25/17 1040  . ondansetron (ZOFRAN) tablet 4 mg  4 mg Oral Q8H PRN Long, Wonda Olds, MD      . pantoprazole (PROTONIX) EC tablet 40 mg  40 mg Oral Daily Long, Wonda Olds, MD   40 mg at 01/25/17 1042  . risperiDONE (RISPERDAL) tablet 3 mg  3 mg Oral QHS Long, Wonda Olds, MD      . sucralfate (CARAFATE) tablet 1 g  1 g Oral TID WC & HS Long, Wonda Olds, MD   1 g at 01/25/17 1217   Current  Outpatient Prescriptions  Medication Sig Dispense Refill  . amantadine (SYMMETREL) 100 MG capsule Take 1 capsule (100 mg total) by mouth 2 (two) times daily. 60 capsule 0  . aspirin EC 81 MG tablet Take 81 mg by mouth daily.    . cetirizine (ZYRTEC) 10 MG tablet Take 10 mg by mouth daily as needed for allergies.    . clonazePAM (KLONOPIN) 0.5 MG tablet Take 0.5 mg by mouth 2 (two) times daily.     . divalproex (DEPAKOTE) 500 MG DR tablet Take 500 mg by mouth 2 (two) times daily.     Marland Kitchen escitalopram (LEXAPRO) 20 MG tablet Take 30 mg by mouth daily.     . fenofibrate 160 MG tablet Take 160 mg by mouth daily.    . Fluticasone-Salmeterol (ADVAIR) 100-50 MCG/DOSE AEPB Inhale 1 puff into the lungs 2 (two) times daily.     . hydrocortisone 2.5 % cream Apply 1 application topically 2 (two) times daily.    . pantoprazole (PROTONIX) 40 MG tablet Take 40 mg by mouth daily.     Marland Kitchen  risperiDONE (RISPERDAL) 3 MG tablet Take 1 tablet (3 mg total) by mouth at bedtime. 30 tablet 0  . sucralfate (CARAFATE) 1 g tablet Take 1 g by mouth 4 (four) times daily -  with meals and at bedtime.      Musculoskeletal: Strength & Muscle Tone: within normal limits Gait & Station: normal Patient leans: N/A  Psychiatric Specialty Exam: Physical Exam  Nursing note and vitals reviewed. Constitutional: She is oriented to person, place, and time.  Neck: Normal range of motion.  Respiratory: Effort normal.  Neurological: She is alert and oriented to person, place, and time.    Review of Systems  Psychiatric/Behavioral: Negative for memory loss. Depression: Stable. Hallucinations: Stable at base line. Substance abuse: Denies. Suicidal ideas: Passive; chronic history. The patient does not have insomnia. Nervous/anxious: Stable.   All other systems reviewed and are negative.   Blood pressure (!) 144/93, pulse 71, temperature 98.5 F (36.9 C), temperature source Oral, resp. rate 19, height '5\' 3"'  (1.6 m), weight 87.5 kg (193  lb), SpO2 96 %.Body mass index is 34.19 kg/m.  General Appearance: Casual  Eye Contact:  Good  Speech:  Clear and Coherent  Volume:  Normal  Mood:  "Good"  Affect:  Appropriate  Thought Process:  Goal Directed  Orientation:  Full (Time, Place, and Person)  Thought Content:  Denies hallucinations, delusions, and paranoia  Suicidal Thoughts:  Chronic history of passive suicidal ideation; no intent, no prior attempts  Homicidal Thoughts:  No  Memory:  Immediate;   Good Recent;   Good Remote;   Good  Judgement:  Fair  Insight:  Fair  Psychomotor Activity:  Normal  Concentration:  Concentration: Fair and Attention Span: Fair  Recall:  Good  Fund of Knowledge:  Fair  Language:  Good  Akathisia:  No  Handed:  Right  AIMS (if indicated):     Assets:  Communication Skills Housing Social Support Transportation  ADL's:  Intact  Cognition:  WNL  Sleep:        Treatment Plan Summary: Plan Discharge home.  Follow up with resources given for psychiatric outpatient services (medication management, therapy)  Disposition: No evidence of imminent risk to self or others at present.   Patient does not meet criteria for psychiatric inpatient admission. Discussed crisis plan, support from social network, calling 911, coming to the Emergency Department, and calling Suicide Hotline.  This service was provided via telemedicine using a 2-way, interactive audio and video technology.  Names of all persons participating in this telemedicine service and their role in this encounter. Name: Earleen Newport NP Role: Telepsych  Name: Dr Dwyane Dee Role: Psychiatrist  Name: [name] Role: Patient  Name: Hilda Blades [facility] caregiver) Role: Group home supervisor    Earleen Newport, NP 01/25/2017 12:24 PM

## 2017-01-25 NOTE — ED Notes (Signed)
Awaiting transportation.

## 2017-01-25 NOTE — ED Notes (Signed)
Pt waiting for transportation-

## 2017-01-25 NOTE — Progress Notes (Signed)
Patient is recommended for a psychiatric evaluation this morning with [facility] psychiatric provider.   Patient is a resident at [facility] [phone]).   CSW contacted the patient's group home to notify them that the patient would likely discharge today. Per group home staff, the patient can return to the group home. Staff requested that someone call when the patient is officially discharged from the hospital so that they can have someone pick her up.   CSW will continue to follow for a final disposition.   Baldo Daub MSW, LCSWA CSW Disposition [phone]

## 2017-01-25 NOTE — ED Notes (Signed)
Pt to be discharged back to [facility]. Spoke to [facility] employee request pt be sent back by EMS  EMS notified

## 2017-01-26 DIAGNOSIS — F329 Major depressive disorder, single episode, unspecified: Secondary | ICD-10-CM | POA: Diagnosis not present

## 2017-01-26 LAB — URINE CULTURE: Special Requests: NORMAL
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
## 2017-01-26 NOTE — ED Notes (Signed)
Still waiting on roads to clear so that patient can get back to her [facility]  group home.

## 2017-02-12 ENCOUNTER — Emergency Department
Admission: EM | Admit: 2017-02-12 | Discharge: 2017-02-12 | Disposition: A | Discharge status: Home / Self Care | Payer: Medicare Other | Attending: Emergency Medicine | Admitting: Emergency Medicine

## 2017-02-12 DIAGNOSIS — J45909 Unspecified asthma, uncomplicated: Secondary | ICD-10-CM | POA: Insufficient documentation

## 2017-02-12 DIAGNOSIS — J449 Chronic obstructive pulmonary disease, unspecified: Secondary | ICD-10-CM | POA: Diagnosis not present

## 2017-02-12 DIAGNOSIS — F603 Borderline personality disorder: Secondary | ICD-10-CM | POA: Diagnosis present

## 2017-02-12 DIAGNOSIS — R45851 Suicidal ideations: Secondary | ICD-10-CM

## 2017-02-12 DIAGNOSIS — R443 Hallucinations, unspecified: Secondary | ICD-10-CM | POA: Diagnosis not present

## 2017-02-12 DIAGNOSIS — F250 Schizoaffective disorder, bipolar type: Secondary | ICD-10-CM | POA: Diagnosis present

## 2017-02-12 DIAGNOSIS — G809 Cerebral palsy, unspecified: Secondary | ICD-10-CM | POA: Insufficient documentation

## 2017-02-12 DIAGNOSIS — F332 Major depressive disorder, recurrent severe without psychotic features: Secondary | ICD-10-CM | POA: Diagnosis not present

## 2017-02-12 DIAGNOSIS — Z7982 Long term (current) use of aspirin: Secondary | ICD-10-CM | POA: Diagnosis not present

## 2017-02-12 DIAGNOSIS — F70 Mild intellectual disabilities: Secondary | ICD-10-CM | POA: Diagnosis present

## 2017-02-12 DIAGNOSIS — Z79899 Other long term (current) drug therapy: Secondary | ICD-10-CM | POA: Diagnosis not present

## 2017-02-12 LAB — CBC
HCT: 38.7 % (ref 35.0–47.0)
Hemoglobin: 13 g/dL (ref 12.0–16.0)
MCH: 33 pg (ref 26.0–34.0)
MCHC: 33.6 g/dL (ref 32.0–36.0)
MCV: 98.1 fL (ref 80.0–100.0)
PLATELETS: 276 10*3/uL (ref 150–440)
RBC: 3.95 MIL/uL (ref 3.80–5.20)
RDW: 13.9 % (ref 11.5–14.5)
WBC: 5.9 10*3/uL (ref 3.6–11.0)

## 2017-02-12 LAB — COMPREHENSIVE METABOLIC PANEL
ALT: 25 U/L (ref 14–54)
AST: 51 U/L — AB (ref 15–41)
Albumin: 3.5 g/dL (ref 3.5–5.0)
Alkaline Phosphatase: 72 U/L (ref 38–126)
Anion gap: 6 (ref 5–15)
BUN: 22 mg/dL — AB (ref 6–20)
CHLORIDE: 105 mmol/L (ref 101–111)
CO2: 28 mmol/L (ref 22–32)
CREATININE: 0.74 mg/dL (ref 0.44–1.00)
Calcium: 9.1 mg/dL (ref 8.9–10.3)
GFR calc Af Amer: >60 mL/min (ref >60)
GLUCOSE: 96 mg/dL (ref 65–99)
Potassium: 3.5 mmol/L (ref 3.5–5.1)
Sodium: 139 mmol/L (ref 135–145)
Total Bilirubin: 0.8 mg/dL (ref 0.3–1.2)
Total Protein: 7.1 g/dL (ref 6.5–8.1)

## 2017-02-12 LAB — ACETAMINOPHEN LEVEL: Acetaminophen (Tylenol), Serum: <10 ug/mL — ABNORMAL LOW (ref 10–30)

## 2017-02-12 LAB — ETHANOL

## 2017-02-12 LAB — SALICYLATE LEVEL: Salicylate Lvl: <7 mg/dL (ref 2.8–30.0)

## 2017-02-12 NOTE — Consult Note (Signed)
[facility] Face-to-Face Psychiatry Consult   Reason for Consult: Consult for 51 year old woman with a history of stability in mood symptoms. Referring Physician: Rip Harbour [name] MRN:  [id number] Principal Diagnosis: Schizoaffective disorder, bipolar type [facility]) Diagnosis:   Patient Active Problem List   Diagnosis Date Noted  . Severe recurrent major depression without psychotic features ([place]) [F33.2] 08/10/2016  . Overdose by acetaminophen [T39.1X1A] 08/08/2016  . Schizoaffective disorder, bipolar type ([place]) [F25.0]   . 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 GERD (gastroesophageal reflux disease) [K21.9] 09/24/2014  . COPD (chronic obstructive pulmonary disease) ([place]) [J44.9] 09/24/2014  . Borderline personality disorder ([place]) [F60.3] 09/24/2014  . Cerebral palsy ([place]) [G80.9] 08/31/2014    Total Time spent with patient: 1 hour  Subjective:   [name] is a 50 y.o. female patient admitted with "I feel left out".  HPI: Patient interviewed chart reviewed.  51 year old woman with a history of intellectual disability and possible schizoaffective disorder.  She had herself brought in from her group home today under the typical circumstances.  She was feeling insulted and left out by some of her friends and the staff at her group home.  She thought about running away but instead asked them to call 911 for her.  Patient is requesting inpatient hospitalization.  Talks vaguely about how she cannot stand living where she is anymore.  She has not done anything to intentionally hurt her self and does not have any specific plan to hurt herself.  She has been compliant with her medicine.  She is not abusing any substances.  Social history: Patient has a guardian.  Lives in a group home.  Medical history: Cerebral palsy.  Gastric reflux COPD.  Substance abuse history: None  Past Psychiatric History: Patient has a long history of mood instability and some  impulsive behavior.  Frequently runs away from her group home when she gets frustrated.  Usually can be redirected within a matter of hours.  Does have at least some past history of suicide attempts and self injury in the past.  No violence.  Usually compliant with medicine  Risk to Self: Is patient at risk for suicide?: Yes Risk to Others:   Prior Inpatient Therapy:   Prior Outpatient Therapy:    Past Medical History:  Past Medical History:  Diagnosis Date  . Anxiety   . Asthma   . Borderline personality disorder ([place])   . Cerebral palsy ([place])   . Depression   . GERD (gastroesophageal reflux disease)   . Mild cognitive impairment   . Schizoaffective disorder ([place])   . Wound abscess     Past Surgical History:  Procedure Laterality Date  . CHOLECYSTECTOMY    . INCISION AND DRAINAGE ABSCESS Right 01/02/2015   Procedure: INCISION AND DRAINAGE ABSCESS;  Surgeon: Dia Crawford III, MD;  Location: [facility] ORS;  Service: General;  Laterality: Right;  . TONSILLECTOMY     Family History:  Family History  Problem Relation Age of Onset  . Heart attack Father   . Diabetes Mother    Family Psychiatric  History: None known Social History:  History  Alcohol Use No     History  Drug Use No    Social History   Social History  . Marital status: Single    Spouse name: N/A  . Number of children: N/A  . Years of education: N/A   Social History Main Topics  . Smoking status: Never Smoker  . Smokeless tobacco: Never Used  . Alcohol  use No  . Drug use: No  . Sexual activity: Not Currently    Birth control/ protection: Implant   Other Topics Concern  . Not on file   Social History Narrative  . No narrative on file   Additional Social History:    Allergies:   Allergies  Allergen Reactions  . Penicillins Nausea And Vomiting and Other (See Comments)    Reaction:  No  Has patient had a PCN reaction causing immediate rash, facial/tongue/throat swelling, SOB or lightheadedness with  hypotension: No Has patient had a PCN reaction causing severe rash involving mucus membranes or skin necrosis: No Has patient had a PCN reaction that required hospitalization No Has patient had a PCN reaction occurring within the last 10 years: No If all of the above answers are "NO", then may proceed with Cephalosporin use.     Labs:  Results for orders placed or performed during the hospital encounter of 02/12/17 (from the past 48 hour(s))  Comprehensive metabolic panel     Status: Abnormal   Collection Time: 02/12/17  2:57 PM  Result Value Ref Range   Sodium 139 135 - 145 mmol/L   Potassium 3.5 3.5 - 5.1 mmol/L   Chloride 105 101 - 111 mmol/L   CO2 28 22 - 32 mmol/L   Glucose, Bld 96 65 - 99 mg/dL   BUN 22 (H) 6 - 20 mg/dL   Creatinine, Ser 0.74 0.44 - 1.00 mg/dL   Calcium 9.1 8.9 - 10.3 mg/dL   Total Protein 7.1 6.5 - 8.1 g/dL   Albumin 3.5 3.5 - 5.0 g/dL   AST 51 (H) 15 - 41 U/L   ALT 25 14 - 54 U/L   Alkaline Phosphatase 72 38 - 126 U/L   Total Bilirubin 0.8 0.3 - 1.2 mg/dL   GFR calc non Af Amer >60 >60 mL/min   GFR calc Af Amer >60 >60 mL/min    Comment: (NOTE) The eGFR has been calculated using the CKD EPI equation. This calculation has not been validated in all clinical situations. eGFR's persistently <60 mL/min signify possible Chronic Kidney Disease.    Anion gap 6 5 - 15  Ethanol     Status: None   Collection Time: 02/12/17  2:57 PM  Result Value Ref Range   Alcohol, Ethyl (B) <10 <10 mg/dL    Comment:        LOWEST DETECTABLE LIMIT FOR SERUM ALCOHOL IS 10 mg/dL FOR MEDICAL PURPOSES ONLY   Salicylate level     Status: None   Collection Time: 02/12/17  2:57 PM  Result Value Ref Range   Salicylate Lvl <4.0 2.8 - 30.0 mg/dL  Acetaminophen level     Status: Abnormal   Collection Time: 02/12/17  2:57 PM  Result Value Ref Range   Acetaminophen (Tylenol), Serum <10 (L) 10 - 30 ug/mL    Comment:        THERAPEUTIC CONCENTRATIONS VARY SIGNIFICANTLY. A RANGE  OF 10-30 ug/mL MAY BE AN EFFECTIVE CONCENTRATION FOR MANY PATIENTS. HOWEVER, SOME ARE BEST TREATED AT CONCENTRATIONS OUTSIDE THIS RANGE. ACETAMINOPHEN CONCENTRATIONS >150 ug/mL AT 4 HOURS AFTER INGESTION AND >50 ug/mL AT 12 HOURS AFTER INGESTION ARE OFTEN ASSOCIATED WITH TOXIC REACTIONS.   cbc     Status: None   Collection Time: 02/12/17  2:57 PM  Result Value Ref Range   WBC 5.9 3.6 - 11.0 K/uL   RBC 3.95 3.80 - 5.20 MIL/uL   Hemoglobin 13.0 12.0 - 16.0 g/dL   HCT  38.7 35.0 - 47.0 %   MCV 98.1 80.0 - 100.0 fL   MCH 33.0 26.0 - 34.0 pg   MCHC 33.6 32.0 - 36.0 g/dL   RDW 13.9 11.5 - 14.5 %   Platelets 276 150 - 440 K/uL    No current facility-administered medications for this encounter.    Current Outpatient Prescriptions  Medication Sig Dispense Refill  . amantadine (SYMMETREL) 100 MG capsule Take 1 capsule (100 mg total) by mouth 2 (two) times daily. 60 capsule 0  . aspirin EC 81 MG tablet Take 81 mg by mouth daily.    . cephALEXin (KEFLEX) 500 MG capsule Take 1 capsule (500 mg total) by mouth 3 (three) times daily. 15 capsule 0  . cetirizine (ZYRTEC) 10 MG tablet Take 10 mg by mouth daily as needed for allergies.    . clonazePAM (KLONOPIN) 0.5 MG tablet Take 0.5 mg by mouth 2 (two) times daily.     . divalproex (DEPAKOTE) 500 MG DR tablet Take 500 mg by mouth 2 (two) times daily.     Marland Kitchen escitalopram (LEXAPRO) 20 MG tablet Take 30 mg by mouth daily.     . fenofibrate 160 MG tablet Take 160 mg by mouth daily.    . Fluticasone-Salmeterol (ADVAIR) 100-50 MCG/DOSE AEPB Inhale 1 puff into the lungs 2 (two) times daily.     . hydrocortisone 2.5 % cream Apply 1 application topically 2 (two) times daily.    . pantoprazole (PROTONIX) 40 MG tablet Take 40 mg by mouth daily.     . risperiDONE (RISPERDAL) 3 MG tablet Take 1 tablet (3 mg total) by mouth at bedtime. 30 tablet 0  . sucralfate (CARAFATE) 1 g tablet Take 1 g by mouth 4 (four) times daily -  with meals and at bedtime.       Musculoskeletal: Strength & Muscle Tone: within normal limits Gait & Station: normal Patient leans: N/A  Psychiatric Specialty Exam: Physical Exam  Nursing note and vitals reviewed. Constitutional: She appears well-developed and well-nourished.  HENT:  Head: Normocephalic and atraumatic.  Eyes: Pupils are equal, round, and reactive to light. Conjunctivae are normal.  Neck: Normal range of motion.  Cardiovascular: Regular rhythm and normal heart sounds.   Respiratory: Effort normal. No respiratory distress.  GI: Soft.  Musculoskeletal: Normal range of motion.  Neurological: She is alert.  Skin: Skin is warm and dry.  Psychiatric: Her affect is blunt. Her speech is delayed. She is slowed. Thought content is not paranoid. Cognition and memory are impaired. She expresses impulsivity. She expresses no homicidal and no suicidal ideation. She exhibits abnormal recent memory.    Review of Systems  Constitutional: Negative.   HENT: Negative.   Eyes: Negative.   Respiratory: Negative.   Cardiovascular: Negative.   Gastrointestinal: Negative.   Musculoskeletal: Negative.   Skin: Negative.   Neurological: Negative.   Psychiatric/Behavioral: Positive for depression. Negative for hallucinations, memory loss, substance abuse and suicidal ideas. The patient is nervous/anxious. The patient does not have insomnia.     Blood pressure 106/60, pulse 80, temperature 98.6 F (37 C), temperature source Oral, resp. rate 16, height '5\' 3"'$  (1.6 m), weight 87.5 kg (193 lb), SpO2 98 %.Body mass index is 34.19 kg/m.  General Appearance: Casual  Eye Contact:  Good  Speech:  Slow and Slurred  Volume:  Decreased  Mood:  Dysphoric  Affect:  Congruent  Thought Process:  Goal Directed  Orientation:  Full (Time, Place, and Person)  Thought Content:  Rumination  and Tangential  Suicidal Thoughts:  Yes.  without intent/plan  Homicidal Thoughts:  No  Memory:  Immediate;   Fair Recent;   Fair Remote;    Fair  Judgement:  Impaired  Insight:  Shallow  Psychomotor Activity:  Normal  Concentration:  Concentration: Fair  Recall:  [employer] of Knowledge:  Fair  Language:  Fair  Akathisia:  No  Handed:  Right  AIMS (if indicated):     Assets:  Desire for Improvement Housing  ADL's:  Intact  Cognition:  WNL  Sleep:        Treatment Plan Summary: Plan 51 year old woman with a history of chronic behavior problems.  Appears to be at her baseline mental state.  Mildly anxious but easily redirected.  Not actively psychotic or delusional.  Not actually engaging in in any violence or self-harm.  Patient was offered supportive counseling and sympathy.  She is not likely to benefit from inpatient hospitalization and does not appear to be acutely dangerous.  Patient advised that she should continue following up with her usual outpatient providers.  Case reviewed with emergency room physician and she can be discharged home.  Disposition: Patient does not meet criteria for psychiatric inpatient admission.  Alethia Berthold, MD 02/12/2017 9:31 PM

## 2017-02-12 NOTE — ED Notes (Signed)
Pt to be discharged home - [facility] a [facility] - she reports that Jimmye NormanLawanda Ray wants this pt to return to the care home via [facility] [employer] [facility] - I spoke with the lady at [facility] and she verbalized that she will transport this pt there - this pt reports to me that she is her own legal guardian  - I educated Lucendia HerrlichFaye to tell Rowan BlaseLawanda that we cannot transport via cab if the pt is not there own legal guardian  Lucendia HerrlichFaye verbalized understanding and reported to me that she will inform [place]

## 2017-02-12 NOTE — ED Triage Notes (Signed)
[facility] [place]  Pt verbalizes a desire to harm herself  "I will walk into traffic or buy a bunch of pills to take without anyone knowing."  Alert and oriented upon arrival

## 2017-02-12 NOTE — ED Notes (Signed)
BEHAVIORAL HEALTH ROUNDING Patient sleeping: No. Patient alert and oriented: yes Behavior appropriate: Yes.  ; If no, describe:  Nutrition and fluids offered: yes Toileting and hygiene offered: Yes  Sitter present: q15 minute observations and security  monitoring Law enforcement present: Yes  ODS  

## 2017-02-12 NOTE — Discharge Instructions (Signed)
Today you were seen and evaluated by our psychiatrist Dr. Toni Amendlapacs who recommends you continue taking her home medications and follow-up with your psychiatrist as already scheduled.  Return to the emergency department for any concerns whatsoever.  It was a pleasure to take care of you today, and thank you for coming to our emergency department.  If you have any questions or concerns before leaving please ask the nurse to grab me and I'm more than happy to go through your aftercare instructions again.  If you were prescribed any opioid pain medication today such as Norco, Vicodin, Percocet, morphine, hydrocodone, or oxycodone please make sure you do not drive when you are taking this medication as it can alter your ability to drive safely.  If you have any concerns once you are home that you are not improving or are in fact getting worse before you can make it to your follow-up appointment, please do not hesitate to call 911 and come back for further evaluation.  Merrily BrittleNeil Jolynne Spurgin, MD  Results for orders placed or performed during the hospital encounter of 02/12/17  Comprehensive metabolic panel  Result Value Ref Range   Sodium 139 135 - 145 mmol/L   Potassium 3.5 3.5 - 5.1 mmol/L   Chloride 105 101 - 111 mmol/L   CO2 28 22 - 32 mmol/L   Glucose, Bld 96 65 - 99 mg/dL   BUN 22 (H) 6 - 20 mg/dL   Creatinine, Ser 1.610.74 0.44 - 1.00 mg/dL   Calcium 9.1 8.9 - 09.610.3 mg/dL   Total Protein 7.1 6.5 - 8.1 g/dL   Albumin 3.5 3.5 - 5.0 g/dL   AST 51 (H) 15 - 41 U/L   ALT 25 14 - 54 U/L   Alkaline Phosphatase 72 38 - 126 U/L   Total Bilirubin 0.8 0.3 - 1.2 mg/dL   GFR calc non Af Amer >60 >60 mL/min   GFR calc Af Amer >60 >60 mL/min   Anion gap 6 5 - 15  Ethanol  Result Value Ref Range   Alcohol, Ethyl (B) <10 <10 mg/dL  Salicylate level  Result Value Ref Range   Salicylate Lvl <7.0 2.8 - 30.0 mg/dL  Acetaminophen level  Result Value Ref Range   Acetaminophen (Tylenol), Serum <10 (L) 10 - 30 ug/mL    cbc  Result Value Ref Range   WBC 5.9 3.6 - 11.0 K/uL   RBC 3.95 3.80 - 5.20 MIL/uL   Hemoglobin 13.0 12.0 - 16.0 g/dL   HCT 04.538.7 40.935.0 - 81.147.0 %   MCV 98.1 80.0 - 100.0 fL   MCH 33.0 26.0 - 34.0 pg   MCHC 33.6 32.0 - 36.0 g/dL   RDW 91.413.9 78.211.5 - 95.614.5 %   Platelets 276 150 - 440 K/uL   Dg Chest 2 View  Result Date: 01/24/2017 CLINICAL DATA:  Chest pain and dyspnea. EXAM: CHEST  2 VIEW COMPARISON:  01/17/2017 FINDINGS: The lungs are clear. The pulmonary vasculature is normal. Heart size is normal. Hilar and mediastinal contours are unremarkable. There is no pleural effusion. IMPRESSION: No active cardiopulmonary disease. Electronically Signed   By: Ellery Plunkaniel R Mitchell M.D.   On: 01/24/2017 18:09   Dg Chest 2 View  Result Date: 01/17/2017 CLINICAL DATA:  Chest pain for 6 months. Productive cough for 1 week. Left-sided numbness and nausea. Dizziness. EXAM: CHEST  2 VIEW COMPARISON:  None. FINDINGS: The heart size and mediastinal contours are within normal limits. Both lungs are clear. The visualized skeletal structures are unremarkable.  IMPRESSION: 1. No active cardiopulmonary disease is radiographically apparent. A cause for the patient's chronic chest pain is not identified. Electronically Signed   By: Gaylyn Rong M.D.   On: 01/17/2017 16:49

## 2017-02-12 NOTE — ED Notes (Signed)

## 2017-02-12 NOTE — ED Provider Notes (Signed)
[facility] Emergency Department Provider Note  ____________________________________________   First MD Initiated Contact with Patient 02/12/17 1529     (approximate)  I have reviewed the triage vital signs and the nursing notes.   HISTORY  Chief Complaint Suicidal and Hallucinations   HPI [name] is a 51 y.o. female who comes to the emergency department with several days of progressive suicidal feelings.  She arrives via EMS from her group home stating that she wants to walk into traffic and just "disappear".  She says that she has attempted suicide in the past Via Tylenol, however she denies actually attempting to hurt herself this time.  She says she is compliant with her psychiatric medications although has not seen a psychiatrist for some time.  Her depressed feelings have been insidious in onset and slowly progressive.  They are worsened by stress and improved when she is able to relax.  Past Medical History:  Diagnosis Date  . Anxiety   . Asthma   . Borderline personality disorder (HCC)   . Cerebral palsy (H[facility])   . Depression   . GERD (gastroesophageal reflux disease)   . Mild cognitive impairment   . Schizoaffective disorder (H[facility])   . Wound abscess     Patient Active Problem List   Diagnosis Date Noted  . Severe recurrent major depression without psychotic features ([facility]) 08/10/2016  . Overdose by acetaminophen 08/08/2016  . Schizoaffective disorder, bipolar type (H[facility])   . 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 GERD (gastroesophageal reflux disease) 09/24/2014  . COPD (chronic obstructive pulmonary disease) (HCC) 09/24/2014  . Borderline personality disorder (HCC) 09/24/2014  . Cerebral palsy (HCC) 08/31/2014    Past Surgical History:  Procedure Laterality Date  . CHOLECYSTECTOMY    . INCISION AND DRAINAGE ABSCESS Right 01/02/2015   Procedure: INCISION AND DRAINAGE ABSCESS;  Surgeon: Tiney Rouge III, MD;  Location: [facility] ORS;   Service: General;  Laterality: Right;  . TONSILLECTOMY      Prior to Admission medications   Medication Sig Start Date End Date Taking? Authorizing Provider  amantadine (SYMMETREL) 100 MG capsule Take 1 capsule (100 mg total) by mouth 2 (two) times daily. 08/14/16   Jimmy Footman, MD  aspirin EC 81 MG tablet Take 81 mg by mouth daily.    [provider]  cephALEXin (KEFLEX) 500 MG capsule Take 1 capsule (500 mg total) by mouth 3 (three) times daily. 01/25/17   Azalia Bilis, MD  cetirizine (ZYRTEC) 10 MG tablet Take 10 mg by mouth daily as needed for allergies.    [provider]  clonazePAM (KLONOPIN) 0.5 MG tablet Take 0.5 mg by mouth 2 (two) times daily.     [provider]  divalproex (DEPAKOTE) 500 MG DR tablet Take 500 mg by mouth 2 (two) times daily.     [provider]  escitalopram (LEXAPRO) 20 MG tablet Take 30 mg by mouth daily.     [provider]  fenofibrate 160 MG tablet Take 160 mg by mouth daily.    [provider]  Fluticasone-Salmeterol (ADVAIR) 100-50 MCG/DOSE AEPB Inhale 1 puff into the lungs 2 (two) times daily.     [provider]  hydrocortisone 2.5 % cream Apply 1 application topically 2 (two) times daily.    [provider]  pantoprazole (PROTONIX) 40 MG tablet Take 40 mg by mouth daily.     [provider]  risperiDONE (RISPERDAL) 3 MG tablet Take 1 tablet (3 mg total) by mouth  at bedtime. 09/25/15   Minna Antis, MD  sucralfate (CARAFATE) 1 g tablet Take 1 g by mouth 4 (four) times daily -  with meals and at bedtime.    [provider]    Allergies Penicillins  Family History  Problem Relation Age of Onset  . Heart attack Father   . Diabetes Mother     Social History Social History  Substance Use Topics  . Smoking status: Never Smoker  . Smokeless tobacco: Never Used  . Alcohol use No    Review of Systems Constitutional: No fever/chills Eyes:  No visual changes. ENT: No sore throat. Cardiovascular: Denies chest pain. Respiratory: Denies shortness of breath. Gastrointestinal: No abdominal pain.  No nausea, no vomiting.  No diarrhea.  No constipation. Genitourinary: Negative for dysuria. Musculoskeletal: Negative for back pain. Skin: Negative for rash. Neurological: Negative for headaches, focal weakness or numbness.   ____________________________________________   PHYSICAL EXAM:  VITAL SIGNS: ED Triage Vitals  Enc Vitals Group     BP 02/12/17 1428 (!) 103/55     Pulse Rate 02/12/17 1428 79     Resp 02/12/17 1428 20     Temp 02/12/17 1428 98.6 F (37 C)     Temp Source 02/12/17 1428 Oral     SpO2 02/12/17 1428 95 %     Weight 02/12/17 1450 193 lb (87.5 kg)     Height 02/12/17 1450 5\' 3"  (1.6 m)     Head Circumference --      Peak Flow --      Pain Score 02/12/17 1449 9     Pain Loc --      Pain Edu? --      Excl. in GC? --     Constitutional: Alert and oriented x4 depressed and flat affect no diaphoresis speaks in full clear sentences Eyes: PERRL EOMI. pupils 6 mm and brisk bilaterally Head: Atraumatic. Nose: No congestion/rhinnorhea. Mouth/Throat: No trismus Neck: No stridor.   Cardiovascular: Normal rate, regular rhythm. Grossly normal heart sounds.  Good peripheral circulation. Respiratory: Normal respiratory effort.  No retractions. Lungs CTAB and moving good air Gastrointestinal: Soft nontender Musculoskeletal: No lower extremity edema   Neurologic:  Normal speech and language. No gross focal neurologic deficits are appreciated. Skin:  Skin is warm, dry and intact. No rash noted. Psychiatric: Depressed affect    ____________________________________________   DIFFERENTIAL includes but not limited to  Suicidal ideation, suicide attempt, depression, medication noncompliance, malingering ____________________________________________   LABS (all labs ordered are listed, but only abnormal results are  displayed)  Labs Reviewed  COMPREHENSIVE METABOLIC PANEL - Abnormal; Notable for the following:       Result Value   BUN 22 (*)    AST 51 (*)    All other components within normal limits  ACETAMINOPHEN LEVEL - Abnormal; Notable for the following:    Acetaminophen (Tylenol), Serum <10 (*)    All other components within normal limits  ETHANOL  SALICYLATE LEVEL  CBC  URINE DRUG SCREEN, QUALITATIVE ([facility] ONLY)    Blood work reviewed and interpreted by me with no acute disease __________________________________________  EKG   ____________________________________________  RADIOLOGY   ____________________________________________   PROCEDURES  Procedure(s) performed: no  Procedures  Critical Care performed: no  Observation: no ____________________________________________   INITIAL IMPRESSION / ASSESSMENT AND PLAN / ED COURSE  Pertinent labs & imaging results that were available during my care of the patient were reviewed by me and considered in my medical decision making (see chart  for details).  The patient arrives hemodynamically stable and well-appearing.  She does report vague suicidality without actually attempting.  She does report suicidal attempts in the past by overdosing on Tylenol.  She reports compliance with unknown psychiatric medications but has not followed up with a psychiatrist in quite some time.  She agrees to remain in the emergency department for evaluation by Dr. Toni Amendlapacs and will not require involuntary commitment at this time.    ----------------------------------------- 4:01 PM on 02/12/2017 -----------------------------------------  Dr. Toni Amendlapacs saw and evaluated the patient and feel she is medically stable for outpatient management.  No indication for involuntary commitment at this time.  I discussed with the patient verbalizes understanding and agreement the plan.  She has no acute active medical issues and is medically stable for outpatient  management.  ____________________________________________   FINAL CLINICAL IMPRESSION(S) / ED DIAGNOSES  Final diagnoses:  Suicidal ideation      NEW MEDICATIONS STARTED DURING THIS VISIT:  New Prescriptions   No medications on file     Note:  This document was prepared using Dragon voice recognition software and may include unintentional dictation errors.     Merrily Brittleifenbark, Dashon Mcintire, MD 02/12/17 [phone]

## 2017-03-01 ENCOUNTER — Other Ambulatory Visit: Payer: Self-pay | Admitting: Family Medicine

## 2017-03-01 DIAGNOSIS — Z1231 Encounter for screening mammogram for malignant neoplasm of breast: Secondary | ICD-10-CM

## 2017-03-13 ENCOUNTER — Encounter (HOSPITAL_COMMUNITY): Payer: Self-pay

## 2017-03-13 ENCOUNTER — Emergency Department (HOSPITAL_COMMUNITY)
Admission: EM | Admit: 2017-03-13 | Discharge: 2017-03-13 | Disposition: A | Discharge status: Home / Self Care | Payer: Medicare Other | Attending: Emergency Medicine | Admitting: Emergency Medicine

## 2017-03-13 ENCOUNTER — Emergency Department (HOSPITAL_COMMUNITY): Payer: Medicare Other

## 2017-03-13 DIAGNOSIS — Z79899 Other long term (current) drug therapy: Secondary | ICD-10-CM | POA: Insufficient documentation

## 2017-03-13 DIAGNOSIS — J449 Chronic obstructive pulmonary disease, unspecified: Secondary | ICD-10-CM | POA: Insufficient documentation

## 2017-03-13 DIAGNOSIS — Y929 Unspecified place or not applicable: Secondary | ICD-10-CM | POA: Diagnosis not present

## 2017-03-13 DIAGNOSIS — Y9301 Activity, walking, marching and hiking: Secondary | ICD-10-CM | POA: Diagnosis not present

## 2017-03-13 DIAGNOSIS — W010XXA Fall on same level from slipping, tripping and stumbling without subsequent striking against object, initial encounter: Secondary | ICD-10-CM | POA: Diagnosis not present

## 2017-03-13 DIAGNOSIS — Z7982 Long term (current) use of aspirin: Secondary | ICD-10-CM | POA: Insufficient documentation

## 2017-03-13 DIAGNOSIS — S32030A Wedge compression fracture of third lumbar vertebra, initial encounter for closed fracture: Secondary | ICD-10-CM | POA: Diagnosis not present

## 2017-03-13 DIAGNOSIS — Y998 Other external cause status: Secondary | ICD-10-CM | POA: Diagnosis not present

## 2017-03-13 DIAGNOSIS — G809 Cerebral palsy, unspecified: Secondary | ICD-10-CM | POA: Insufficient documentation

## 2017-03-13 DIAGNOSIS — S3992XA Unspecified injury of lower back, initial encounter: Secondary | ICD-10-CM | POA: Diagnosis present

## 2017-03-13 LAB — BASIC METABOLIC PANEL
ANION GAP: 8 (ref 5–15)
BUN: 21 mg/dL — AB (ref 6–20)
CHLORIDE: 105 mmol/L (ref 101–111)
CO2: 29 mmol/L (ref 22–32)
Calcium: 9.5 mg/dL (ref 8.9–10.3)
Creatinine, Ser: 0.63 mg/dL (ref 0.44–1.00)
GFR calc Af Amer: >60 mL/min (ref >60)
GLUCOSE: 108 mg/dL — AB (ref 65–99)
POTASSIUM: 3.3 mmol/L — AB (ref 3.5–5.1)
Sodium: 142 mmol/L (ref 135–145)

## 2017-03-13 LAB — CBC
HCT: 39.4 % (ref 36.0–46.0)
Hemoglobin: 12.3 g/dL (ref 12.0–15.0)
MCH: 32.8 pg (ref 26.0–34.0)
MCHC: 31.2 g/dL (ref 30.0–36.0)
MCV: 105.1 fL — ABNORMAL HIGH (ref 78.0–100.0)
PLATELETS: 263 10*3/uL (ref 150–400)
RBC: 3.75 MIL/uL — ABNORMAL LOW (ref 3.87–5.11)
RDW: 15 % (ref 11.5–15.5)
WBC: 9.6 10*3/uL (ref 4.0–10.5)

## 2017-03-13 MED ORDER — HYDROCODONE-ACETAMINOPHEN 5-325 MG PO TABS
1.0000 | ORAL_TABLET | ORAL | Status: AC
  Administered 2017-03-13: 1 via ORAL
  Filled 2017-03-13: qty 1

## 2017-03-13 MED ORDER — HYDROCODONE-ACETAMINOPHEN 5-325 MG PO TABS: 1.0000 | ORAL_TABLET | ORAL | 0 refills | Status: DC | PRN

## 2017-03-13 NOTE — ED Notes (Signed)
[name] [phone] called and will send administrator to pick up pt.

## 2017-03-13 NOTE — ED Notes (Signed)
[employer] here to fit pt for TSLO brace.

## 2017-03-13 NOTE — ED Triage Notes (Signed)
Pt resident of [facility].  EMS says pt c/o dizziness and fell today.  Pt told ems she tripped but was c/o dizziness and ems noticed bigeminy on ekg.  Pt alert and oriented.  C/O pain in mid to lower back.

## 2017-03-13 NOTE — Discharge Instructions (Signed)
Medications as prescribed, wear the brace for comfort, follow-up with spine surgeon to make sure your healing properly, call to schedule an appointment

## 2017-03-13 NOTE — ED Notes (Signed)
Called [employer] for TLSO brace.

## 2017-03-13 NOTE — ED Provider Notes (Signed)
[facility]ERGENCY DEPARTMENT Provider Note   CSN: [id number] Arrival date & time: 03/13/17  1204     History   Chief Complaint Chief Complaint  Patient presents with  . Dizziness    HPI [name] is a 51 y.o. female.  HPI Patient presents to the emergency room for evaluation of lower back pain.  Patient states she was walking today.  She felt like her left leg gave out on her and she fell.  She denies having any vertigo.  She denies any difficulty with her speech.  She denies any current weakness.  Since the fall however she has had some pain in her lower back.  She denies any numbness.  No abdominal pain.  No fever or vomiting.  Patient does have a history of cerebral palsy. Past Medical History:  Diagnosis Date  . Anxiety   . Asthma   . Borderline personality disorder (HCC)   . Cerebral palsy (HCC)   . Depression   . GERD (gastroesophageal reflux disease)   . Mild cognitive impairment   . Schizoaffective disorder (HCC)   . Wound abscess     Patient Active Problem List   Diagnosis Date Noted  . Severe recurrent major depression without psychotic features (HCC) 08/10/2016  . Overdose by acetaminophen 08/08/2016  . Schizoaffective disorder, bipolar type (HCC)   . 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 GERD (gastroesophageal reflux disease) 09/24/2014  . COPD (chronic obstructive pulmonary disease) (HCC) 09/24/2014  . Borderline personality disorder (HCC) 09/24/2014  . Cerebral palsy (HCC) 08/31/2014    Past Surgical History:  Procedure Laterality Date  . CHOLECYSTECTOMY    . INCISION AND DRAINAGE ABSCESS Right 01/02/2015   Procedure: INCISION AND DRAINAGE ABSCESS;  Surgeon: Tiney Rougealph Ely III, MD;  Location: [facility] ORS;  Service: General;  Laterality: Right;  . TONSILLECTOMY      OB History    Gravida Para Term Preterm AB Living   0 0 0 0 0 0   SAB TAB Ectopic Multiple Live Births   0 0 0 0         Home Medications    Prior to Admission  medications   Medication Sig Start Date End Date Taking? Authorizing Provider  amantadine (SYMMETREL) 100 MG capsule Take 1 capsule (100 mg total) by mouth 2 (two) times daily. 08/14/16  Yes Jimmy FootmanHernandez-Gonzalez, Andrea, MD  ARIPiprazole (ABILIFY) 5 MG tablet Take 5 mg by mouth daily.   Yes [provider]  aspirin EC 81 MG tablet Take 81 mg by mouth daily.   Yes [provider]  clonazePAM (KLONOPIN) 0.5 MG tablet Take 0.5 mg by mouth 2 (two) times daily.    Yes [provider]  divalproex (DEPAKOTE) 500 MG DR tablet Take 500 mg by mouth 2 (two) times daily.    Yes [provider]  escitalopram (LEXAPRO) 20 MG tablet Take 30 mg by mouth daily.    Yes [provider]  fenofibrate 160 MG tablet Take 160 mg by mouth daily.   Yes [provider]  Fluticasone-Salmeterol (ADVAIR) 100-50 MCG/DOSE AEPB Inhale 1 puff into the lungs 2 (two) times daily.    Yes [provider]  furosemide (LASIX) 20 MG tablet Take 20 mg by mouth daily.   Yes [provider]  Omega-3 Fatty Acids (FISH OIL PO) Take 1 capsule by mouth daily.   Yes [provider]  pantoprazole (PROTONIX) 40 MG tablet Take 40 mg by mouth daily.    Yes [provider]  risperiDONE (RISPERDAL) 3 MG tablet Take 1 tablet (3 mg total) by mouth at bedtime. 09/25/15  Yes Minna Antis, MD  sucralfate (CARAFATE) 1 g tablet Take 1 g by mouth 4 (four) times daily -  with meals and at bedtime.   Yes [provider]  cephALEXin (KEFLEX) 500 MG capsule Take 1 capsule (500 mg total) by mouth 3 (three) times daily. Patient not taking: Reported on 03/13/2017 01/25/17   Azalia Bilis, MD  HYDROcodone-acetaminophen (NORCO/VICODIN) 5-325 MG tablet Take 1 tablet by mouth every 4 (four) hours as needed. 03/13/17   Linwood Dibbles, MD    Family History Family History  Problem Relation Age of Onset  . Heart attack Father   . Diabetes Mother     Social  History Social History   Tobacco Use  . Smoking status: Never Smoker  . Smokeless tobacco: Never Used  Substance Use Topics  . Alcohol use: No  . Drug use: No     Allergies   Penicillins   Review of Systems Review of Systems  All other systems reviewed and are negative.    Physical Exam Updated Vital Signs BP 118/84   Pulse 74   Temp 97.7 F (36.5 C) (Oral)   Resp 19   SpO2 96%   Physical Exam  Constitutional: No distress.  HENT:  Head: Normocephalic and atraumatic.  Right Ear: External ear normal.  Left Ear: External ear normal.  Eyes: Conjunctivae are normal. Right eye exhibits no discharge. Left eye exhibits no discharge. No scleral icterus.  Neck: Neck supple. No tracheal deviation present.  Cardiovascular: Normal rate, regular rhythm and intact distal pulses.  Pulmonary/Chest: Effort normal and breath sounds normal. No stridor. No respiratory distress. She has no wheezes. She has no rales.  Abdominal: Soft. Bowel sounds are normal. She exhibits no distension. There is no tenderness. There is no rebound and no guarding.  Musculoskeletal: She exhibits no edema.       Lumbar back: She exhibits tenderness and bony tenderness. She exhibits no swelling, no edema and no deformity.  Neurological: She is alert. She has normal strength. No cranial nerve deficit (no facial droop, extraocular movements intact, no slurred speech) or sensory deficit. She exhibits normal muscle tone. She displays no seizure activity. Coordination normal.  Equal strength in all extremities, no weakness,  Skin: Skin is warm and dry. No rash noted. She is not diaphoretic.  Psychiatric: She has a normal mood and affect.  Nursing note and vitals reviewed.    ED Treatments / Results  Labs (all labs ordered are listed, but only abnormal results are displayed) Labs Reviewed  CBC - Abnormal; Notable for the following components:      Result Value   RBC 3.75 (*)    MCV 105.1 (*)    All other  components within normal limits  BASIC METABOLIC PANEL - Abnormal; Notable for the following components:   Potassium 3.3 (*)    Glucose, Bld 108 (*)    BUN 21 (*)    All other components within normal limits    EKG  EKG Interpretation  Date/Time:  Tuesday March 13 2017 12:25:46 EST Ventricular Rate:  69 PR Interval:    QRS Duration: 98 QT Interval:  434 QTC Calculation: 465 R Axis:   62 Text Interpretation:  Sinus rhythm Ventricular premature complex Low voltage, precordial leads No significant change since last tracing Confirmed by Linwood Dibbles [phone]) on 03/13/2017 12:34:27 PM       Radiology  Dg Lumbar Spine Complete  Result Date: 03/13/2017 CLINICAL DATA:  Larey SeatFell today after becoming dizzy, tripped, mid to lower back pain EXAM: LUMBAR SPINE - COMPLETE 4+ VIEW COMPARISON:  None ; correlation chest radiographs 01/24/2017 and 01/17/2017 FINDINGS: Fivenon-rib-bearing lumbar vertebra. Facet degenerative changes lower lumbar spine. Compression fracture of L3 vertebral body with minimal anterior height loss. Concavity at the superior endplate of L2 with minimal anterior height loss, appears old. Age-indeterminate concavity and anterior cortex irregularity at L4. Scattered endplate spur formation most prominent at L1-L2. 10.7 mm of anterolisthesis at L5-S1 due to BILATERAL spondylolysis L5. No additional fracture or subluxation. SI joints symmetric. IMPRESSION: Acute appearing compression fracture of L3 vertebral body with minimal anterior height loss. Age-indeterminate superior endplate compression deformity of L4. Old appearing compression fracture at superior endplate L2. 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 Electronically Signed   By: Ulyses SouthwardMark  Boles M.D.   On: 03/13/2017 14:29    Procedures Procedures (including critical care time)  Medications Ordered in ED Medications  HYDROcodone-acetaminophen (NORCO/VICODIN) 5-325 MG per tablet 1 tablet (not  administered)     Initial Impression / Assessment and Plan / ED Course  I have reviewed the triage vital signs and the nursing notes.  Pertinent labs & imaging results that were available during my care of the patient were reviewed by me and considered in my medical decision making (see chart for details).  Clinical Course as of Mar 13 1508  Tue Mar 13, 2017  1506 [place] database review.  Last benzo on 11/20.  Last opiate on 11.15.2017  [JK]    Clinical Course User Index [JK] Linwood DibblesKnapp, Valente Fosberg, MD  Patient presented to the emergency room after a fall.  Suspect this may be related to some gait issues associated with her cerebral palsy.  No evidence to suggest stroke, acute infection or other acute medical issue.  X-rays do suggest an acute lumbar compression fracture of L3 with minimal anterior height loss.   This is in the setting of old compression fractures.  Pt was able to walk to the bathroom.  She is neurologically intact.  Place her in a TLSO brace.  Will discharge home with outpatient neurosurgery follow-up  Final Clinical Impressions(s) / ED Diagnoses   Final diagnoses:  Closed compression fracture of third lumbar vertebra, initial encounter [facility](HCC)    ED Discharge Orders        Ordered    HYDROcodone-acetaminophen (NORCO/VICODIN) 5-325 MG tablet  Every 4 hours PRN     03/13/17 1508       Linwood DibblesKnapp, Leavy Heatherly, MD 03/13/17 [id number]

## 2017-04-20 ENCOUNTER — Emergency Department (HOSPITAL_COMMUNITY)
Admission: EM | Admit: 2017-04-20 | Discharge: 2017-04-21 | Disposition: A | Discharge status: Home / Self Care | Payer: Medicare Other | Attending: Emergency Medicine | Admitting: Emergency Medicine

## 2017-04-20 ENCOUNTER — Encounter (HOSPITAL_COMMUNITY): Payer: Self-pay | Admitting: Emergency Medicine

## 2017-04-20 ENCOUNTER — Other Ambulatory Visit: Payer: Self-pay

## 2017-04-20 ENCOUNTER — Emergency Department (HOSPITAL_COMMUNITY): Payer: Medicare Other

## 2017-04-20 DIAGNOSIS — J449 Chronic obstructive pulmonary disease, unspecified: Secondary | ICD-10-CM | POA: Insufficient documentation

## 2017-04-20 DIAGNOSIS — J45909 Unspecified asthma, uncomplicated: Secondary | ICD-10-CM | POA: Insufficient documentation

## 2017-04-20 DIAGNOSIS — Z7982 Long term (current) use of aspirin: Secondary | ICD-10-CM | POA: Diagnosis not present

## 2017-04-20 DIAGNOSIS — R197 Diarrhea, unspecified: Secondary | ICD-10-CM | POA: Insufficient documentation

## 2017-04-20 DIAGNOSIS — R1084 Generalized abdominal pain: Secondary | ICD-10-CM | POA: Diagnosis not present

## 2017-04-20 LAB — URINALYSIS, ROUTINE W REFLEX MICROSCOPIC
BILIRUBIN URINE: NEGATIVE
GLUCOSE, UA: NEGATIVE mg/dL
Hgb urine dipstick: NEGATIVE
KETONES UR: NEGATIVE mg/dL
Nitrite: NEGATIVE
PH: 5 (ref 5.0–8.0)
Protein, ur: 30 mg/dL — AB
Specific Gravity, Urine: 1.027 (ref 1.005–1.030)

## 2017-04-20 LAB — COMPREHENSIVE METABOLIC PANEL
ALT: 29 U/L (ref 14–54)
ANION GAP: 11 (ref 5–15)
AST: 43 U/L — ABNORMAL HIGH (ref 15–41)
Albumin: 3.6 g/dL (ref 3.5–5.0)
Alkaline Phosphatase: 85 U/L (ref 38–126)
BUN: 17 mg/dL (ref 6–20)
CHLORIDE: 104 mmol/L (ref 101–111)
CO2: 27 mmol/L (ref 22–32)
CREATININE: 0.75 mg/dL (ref 0.44–1.00)
Calcium: 9.6 mg/dL (ref 8.9–10.3)
Glucose, Bld: 91 mg/dL (ref 65–99)
POTASSIUM: 3.8 mmol/L (ref 3.5–5.1)
SODIUM: 142 mmol/L (ref 135–145)
Total Bilirubin: 0.5 mg/dL (ref 0.3–1.2)
Total Protein: 7.4 g/dL (ref 6.5–8.1)

## 2017-04-20 LAB — LIPASE, BLOOD: Lipase: 34 U/L (ref 11–51)

## 2017-04-20 LAB — CBC
HCT: 42.3 % (ref 36.0–46.0)
HEMOGLOBIN: 13.4 g/dL (ref 12.0–15.0)
MCH: 32.7 pg (ref 26.0–34.0)
MCHC: 31.7 g/dL (ref 30.0–36.0)
MCV: 103.2 fL — AB (ref 78.0–100.0)
PLATELETS: 365 10*3/uL (ref 150–400)
RBC: 4.1 MIL/uL (ref 3.87–5.11)
RDW: 13.4 % (ref 11.5–15.5)
WBC: 8.1 10*3/uL (ref 4.0–10.5)

## 2017-04-20 LAB — PREGNANCY, URINE: Preg Test, Ur: NEGATIVE

## 2017-04-20 MED ORDER — LIDOCAINE HCL (PF) 1 % IJ SOLN: 5.0000 mL | Freq: Once | INTRAMUSCULAR | Status: DC

## 2017-04-20 NOTE — ED Triage Notes (Signed)
abd pain for "long time" 5-6 weeks Had 2 BM today and came to ED  [facility] [address]  [place], [place] [phone]

## 2017-04-21 ENCOUNTER — Other Ambulatory Visit: Payer: Self-pay

## 2017-04-21 DIAGNOSIS — R197 Diarrhea, unspecified: Secondary | ICD-10-CM | POA: Diagnosis not present

## 2017-04-21 MED ORDER — LOPERAMIDE HCL 2 MG PO CAPS: 2.0000 mg | ORAL_CAPSULE | Freq: Four times a day (QID) | ORAL | 0 refills | Status: DC | PRN

## 2017-04-21 MED ORDER — LOPERAMIDE HCL 2 MG PO CAPS
4.0000 mg | ORAL_CAPSULE | Freq: Once | ORAL | Status: AC
  Administered 2017-04-21: 4 mg via ORAL
  Filled 2017-04-21: qty 2

## 2017-04-21 NOTE — ED Notes (Signed)
Pt alert & oriented x4, stable gait. Patient  given discharge instructions, paperwork & prescription(s). Patient verbalized understanding. Pt left department w/ no further questions. 

## 2017-04-21 NOTE — Discharge Instructions (Signed)
You may take the imodium if needed for continued abdominal cramping or diarrhea.  You do not have constipation today. Your labs and xray is reassuring that there does not appear to be an dangerous reason for your symptoms tonight. Drink plenty of fluids and get rechecked for any worsened or persistent symptoms.

## 2017-04-21 NOTE — ED Provider Notes (Signed)
[facility] EMERGENCY DEPARTMENT Provider Note   CSN: [id number] Arrival date & time: 04/20/17  1406     History   Chief Complaint Chief Complaint  Patient presents with  . Abdominal Pain    HPI [name] is a 52 y.o. female with a history of asthma, cerebral palsy, GERD, COPD and depression with borderline personality disorder presenting with abdominal pain and diarrhea.  She states she has had chronic midline to right lower quadrant abdominal pain for the past 5-6 weeks which has been intermittent and described as "someone hit me in the stomach". She has found no triggers such as meals or certain foods.  Additionally she endorses chronic constipation generally, stating a bowel movement every 2-3 days with difficulty secondary to hard stools.  However she has had 2 episodes of diarrhea this morning.  She denies bloody stools and has had no fevers, chills, nausea, vomiting.  She denies back pain, dysuria, vaginal discharge.  She has had no medications prior to arrival for her symptoms.  She presents from a local family care home, her boyfriend is here also for evaluation of unrelated symptoms.  The history is provided by the patient.    Past Medical History:  Diagnosis Date  . Anxiety   . Asthma   . Borderline personality disorder (HCC)   . Cerebral palsy (HCC)   . Depression   . GERD (gastroesophageal reflux disease)   . Mild cognitive impairment   . Schizoaffective disorder (HCC)   . Wound abscess     Patient Active Problem List   Diagnosis Date Noted  . Severe recurrent major depression without psychotic features (HCC) 08/10/2016  . Overdose by acetaminophen 08/08/2016  . Schizoaffective disorder, bipolar type (HCC)   . 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 GERD (gastroesophageal reflux disease) 09/24/2014  . COPD (chronic obstructive pulmonary disease) (HCC) 09/24/2014  . Borderline personality disorder (HCC) 09/24/2014  . Cerebral palsy (HCC) 08/31/2014     Past Surgical History:  Procedure Laterality Date  . CHOLECYSTECTOMY    . INCISION AND DRAINAGE ABSCESS Right 01/02/2015   Procedure: INCISION AND DRAINAGE ABSCESS;  Surgeon: Tiney Rouge III, MD;  Location: [facility] ORS;  Service: General;  Laterality: Right;  . TONSILLECTOMY      OB History    Gravida Para Term Preterm AB Living   0 0 0 0 0 0   SAB TAB Ectopic Multiple Live Births   0 0 0 0         Home Medications    Prior to Admission medications   Medication Sig Start Date End Date Taking? Authorizing Provider  amantadine (SYMMETREL) 100 MG capsule Take 1 capsule (100 mg total) by mouth 2 (two) times daily. 08/14/16   Jimmy Footman, MD  ARIPiprazole (ABILIFY) 5 MG tablet Take 5 mg by mouth daily.    [provider]  aspirin EC 81 MG tablet Take 81 mg by mouth daily.    [provider]  cephALEXin (KEFLEX) 500 MG capsule Take 1 capsule (500 mg total) by mouth 3 (three) times daily. Patient not taking: Reported on 03/13/2017 01/25/17   Azalia Bilis, MD  clonazePAM (KLONOPIN) 0.5 MG tablet Take 0.5 mg by mouth 2 (two) times daily.     [provider]  divalproex (DEPAKOTE) 500 MG DR tablet Take 500 mg by mouth 2 (two) times daily.     [provider]  escitalopram (LEXAPRO) 20 MG tablet Take 30 mg by mouth daily.     [provider]  fenofibrate 160 MG tablet Take 160 mg by mouth daily.    [provider]  Fluticasone-Salmeterol (ADVAIR) 100-50 MCG/DOSE AEPB Inhale 1 puff into the lungs 2 (two) times daily.     [provider]  furosemide (LASIX) 20 MG tablet Take 20 mg by mouth daily.    [provider]  HYDROcodone-acetaminophen (NORCO/VICODIN) 5-325 MG tablet Take 1 tablet by mouth every 4 (four) hours as needed. 03/13/17   Linwood Dibbles, MD  loperamide (IMODIUM) 2 MG capsule Take 1 capsule (2 mg total) by mouth 4 (four) times daily as needed for diarrhea or loose stools. 04/21/17   Burgess Amor, PA-C   Omega-3 Fatty Acids (FISH OIL PO) Take 1 capsule by mouth daily.    [provider]  pantoprazole (PROTONIX) 40 MG tablet Take 40 mg by mouth daily.     [provider]  risperiDONE (RISPERDAL) 3 MG tablet Take 1 tablet (3 mg total) by mouth at bedtime. 09/25/15   Minna Antis, MD  sucralfate (CARAFATE) 1 g tablet Take 1 g by mouth 4 (four) times daily -  with meals and at bedtime.    [provider]    Family History Family History  Problem Relation Age of Onset  . Heart attack Father   . Diabetes Mother     Social History Social History   Tobacco Use  . Smoking status: Never Smoker  . Smokeless tobacco: Never Used  Substance Use Topics  . Alcohol use: No  . Drug use: No     Allergies   Penicillins   Review of Systems Review of Systems  Constitutional: Negative for fever.  HENT: Negative for congestion and sore throat.   Eyes: Negative.   Respiratory: Negative for chest tightness and shortness of breath.   Cardiovascular: Negative for chest pain.  Gastrointestinal: Positive for abdominal pain, constipation and diarrhea. Negative for blood in stool and nausea.  Genitourinary: Negative.   Musculoskeletal: Negative for arthralgias, joint swelling and neck pain.  Skin: Negative.  Negative for rash and wound.  Neurological: Negative for dizziness, weakness, light-headedness, numbness and headaches.  Psychiatric/Behavioral: Negative.      Physical Exam Updated Vital Signs BP (!) 128/91   Pulse 74   Temp 98.4 F (36.9 C) (Oral)   Resp 16   Ht 5\' 4"  (1.626 m)   Wt 81.2 kg (179 lb)   SpO2 98%   BMI 30.73 kg/m   Physical Exam  Constitutional: She appears well-developed and well-nourished.  HENT:  Head: Normocephalic and atraumatic.  Eyes: Conjunctivae are normal.  Neck: Normal range of motion.  Cardiovascular: Normal rate, regular rhythm, normal heart sounds and intact distal pulses.  Pulmonary/Chest: Effort normal and breath  sounds normal. She has no wheezes.  Abdominal: Soft. Bowel sounds are normal. She exhibits no distension and no mass. There is tenderness in the suprapubic area. There is no rigidity, no rebound, no guarding and no tenderness at McBurney's point.  Musculoskeletal: Normal range of motion.  Neurological: She is alert.  Skin: Skin is warm and dry.  Psychiatric: She has a normal mood and affect.  Nursing note and vitals reviewed.    ED Treatments / Results  Labs (all labs ordered are listed, but only abnormal results are displayed) Labs Reviewed  COMPREHENSIVE METABOLIC PANEL - Abnormal; Notable for the following components:      Result Value   AST 43 (*)    All other components within normal limits  CBC - Abnormal;  Notable for the following components:   MCV 103.2 (*)    All other components within normal limits  URINALYSIS, ROUTINE W REFLEX MICROSCOPIC - Abnormal; Notable for the following components:   Color, Urine AMBER (*)    APPearance CLOUDY (*)    Protein, ur 30 (*)    Leukocytes, UA TRACE (*)    Bacteria, UA RARE (*)    Squamous Epithelial / LPF 0-5 (*)    All other components within normal limits  LIPASE, BLOOD  PREGNANCY, URINE    EKG  EKG Interpretation None       Radiology Dg Abd 2 Views  Result Date: 04/21/2017 CLINICAL DATA:  Lower abdominal pain for weeks.  Constipation. EXAM: ABDOMEN - 2 VIEW COMPARISON:  None. FINDINGS: Normal bowel gas pattern. No bowel dilatation to suggest obstruction. No evidence of free air. Small volume of stool in the ascending and sigmoid colon. Cholecystectomy clips in the right upper quadrant. No soft tissue calcifications or radiopaque calculi. Lung bases are clear. No osseous abnormality. Chronic L3 compression fracture. IMPRESSION: Normal bowel gas pattern with small volume of colonic stool. No imaging findings to suggest constipation. Electronically Signed   By: Rubye OaksMelanie  Ehinger M.D.   On: 04/21/2017 00:22     Procedures Procedures (including critical care time)  Medications Ordered in ED Medications  loperamide (IMODIUM) capsule 4 mg (4 mg Oral Given 04/21/17 0049)     Initial Impression / Assessment and Plan / ED Course  I have reviewed the triage vital signs and the nursing notes.  Pertinent labs & imaging results that were available during my care of the patient were reviewed by me and considered in my medical decision making (see chart for details).    Labs and imaging reviewed and discussed with patient.  Pt with chronic abdominal pain with no exam findings to suggest acute surgical abdomen.  Concern for overflow diarrhea from impaction ruled out by imaging. Pt was given imodium for current acute diarrhea, cautioned re using only if diarrhea persists. Advised f/u with pcp if sx persist.  Return precautions discussed.   Final Clinical Impressions(s) / ED Diagnoses   Final diagnoses:  Diarrhea, unspecified type  Generalized abdominal pain    ED Discharge Orders        Ordered    loperamide (IMODIUM) 2 MG capsule  4 times daily PRN     04/21/17 0039       Burgess AmorIdol, Amariyon Maynes, PA-C 04/21/17 0243    Vanetta MuldersZackowski, Scott, MD 04/21/17 1515

## 2017-04-25 ENCOUNTER — Encounter (HOSPITAL_COMMUNITY): Payer: Self-pay | Admitting: Emergency Medicine

## 2017-04-25 ENCOUNTER — Emergency Department (HOSPITAL_COMMUNITY)
Admission: EM | Admit: 2017-04-25 | Discharge: 2017-04-25 | Disposition: A | Discharge status: Home / Self Care | Payer: Medicare Other | Attending: Emergency Medicine | Admitting: Emergency Medicine

## 2017-04-25 ENCOUNTER — Emergency Department (HOSPITAL_COMMUNITY): Payer: Medicare Other

## 2017-04-25 ENCOUNTER — Other Ambulatory Visit: Payer: Self-pay

## 2017-04-25 DIAGNOSIS — J45909 Unspecified asthma, uncomplicated: Secondary | ICD-10-CM | POA: Diagnosis not present

## 2017-04-25 DIAGNOSIS — R05 Cough: Secondary | ICD-10-CM | POA: Insufficient documentation

## 2017-04-25 DIAGNOSIS — R072 Precordial pain: Secondary | ICD-10-CM | POA: Insufficient documentation

## 2017-04-25 DIAGNOSIS — Z79899 Other long term (current) drug therapy: Secondary | ICD-10-CM | POA: Diagnosis not present

## 2017-04-25 DIAGNOSIS — R1084 Generalized abdominal pain: Secondary | ICD-10-CM | POA: Diagnosis not present

## 2017-04-25 DIAGNOSIS — Z7982 Long term (current) use of aspirin: Secondary | ICD-10-CM | POA: Insufficient documentation

## 2017-04-25 DIAGNOSIS — R0602 Shortness of breath: Secondary | ICD-10-CM | POA: Insufficient documentation

## 2017-04-25 DIAGNOSIS — R079 Chest pain, unspecified: Secondary | ICD-10-CM | POA: Diagnosis present

## 2017-04-25 DIAGNOSIS — J449 Chronic obstructive pulmonary disease, unspecified: Secondary | ICD-10-CM | POA: Insufficient documentation

## 2017-04-25 LAB — BASIC METABOLIC PANEL
Anion gap: 10 (ref 5–15)
BUN: 22 mg/dL — AB (ref 6–20)
CHLORIDE: 101 mmol/L (ref 101–111)
CO2: 28 mmol/L (ref 22–32)
Calcium: 9.2 mg/dL (ref 8.9–10.3)
Creatinine, Ser: 0.68 mg/dL (ref 0.44–1.00)
GFR calc Af Amer: >60 mL/min (ref >60)
GFR calc non Af Amer: >60 mL/min (ref >60)
Glucose, Bld: 85 mg/dL (ref 65–99)
POTASSIUM: 3.6 mmol/L (ref 3.5–5.1)
Sodium: 139 mmol/L (ref 135–145)

## 2017-04-25 LAB — CBC WITH DIFFERENTIAL/PLATELET
BASOS ABS: 0.1 10*3/uL (ref 0.0–0.1)
Basophils Relative: 1 %
Eosinophils Absolute: 0.3 10*3/uL (ref 0.0–0.7)
Eosinophils Relative: 4 %
HEMATOCRIT: 38.5 % (ref 36.0–46.0)
HEMOGLOBIN: 12.3 g/dL (ref 12.0–15.0)
LYMPHS PCT: 32 %
Lymphs Abs: 2.3 10*3/uL (ref 0.7–4.0)
MCH: 33.2 pg (ref 26.0–34.0)
MCHC: 31.9 g/dL (ref 30.0–36.0)
MCV: 103.8 fL — AB (ref 78.0–100.0)
Monocytes Absolute: 0.9 10*3/uL (ref 0.1–1.0)
Monocytes Relative: 12 %
NEUTROS ABS: 3.7 10*3/uL (ref 1.7–7.7)
Neutrophils Relative %: 51 %
Platelets: 286 10*3/uL (ref 150–400)
RBC: 3.71 MIL/uL — AB (ref 3.87–5.11)
RDW: 13.3 % (ref 11.5–15.5)
WBC: 7.2 10*3/uL (ref 4.0–10.5)

## 2017-04-25 LAB — TROPONIN I

## 2017-04-25 NOTE — ED Triage Notes (Signed)
Pt reports chest pain since this am. EMS administered 324mg  of baby aspirin and nitro x1.

## 2017-04-25 NOTE — ED Notes (Signed)
PT called her group home administrator to come pick her up at discharge.

## 2017-04-25 NOTE — ED Provider Notes (Signed)
[facility] EMERGENCY DEPARTMENT Provider Note   CSN: [id number] Arrival date & time: 04/25/17  4401     History   Chief Complaint Chief Complaint  Patient presents with  . Chest Pain    HPI [name] is a 52 y.o. female.  Patient from group home.  With complaint of chest pain that started about 1 in the morning.  Substernal in nature.  Also has had a cough for 2 weeks shortness of breath for 2 weeks.  Also some complaint of some generalized abdominal pain for 2 weeks.  Patient is concern for coming in was the new onset of the substernal chest pain.  The pain radiates to both shoulders      Past Medical History:  Diagnosis Date  . Anxiety   . Asthma   . Borderline personality disorder (HCC)   . Cerebral palsy (HCC)   . Depression   . GERD (gastroesophageal reflux disease)   . Mild cognitive impairment   . Schizoaffective disorder (HCC)   . Wound abscess     Patient Active Problem List   Diagnosis Date Noted  . Severe recurrent major depression without psychotic features (HCC) 08/10/2016  . Overdose by acetaminophen 08/08/2016  . Schizoaffective disorder, bipolar type (HCC)   . 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 GERD (gastroesophageal reflux disease) 09/24/2014  . COPD (chronic obstructive pulmonary disease) (HCC) 09/24/2014  . Borderline personality disorder (HCC) 09/24/2014  . Cerebral palsy (HCC) 08/31/2014    Past Surgical History:  Procedure Laterality Date  . CHOLECYSTECTOMY    . INCISION AND DRAINAGE ABSCESS Right 01/02/2015   Procedure: INCISION AND DRAINAGE ABSCESS;  Surgeon: Tiney Rouge III, MD;  Location: [facility] ORS;  Service: General;  Laterality: Right;  . TONSILLECTOMY      OB History    Gravida Para Term Preterm AB Living   0 0 0 0 0 0   SAB TAB Ectopic Multiple Live Births   0 0 0 0         Home Medications    Prior to Admission medications   Medication Sig Start Date End Date Taking? Authorizing Provider  amantadine  (SYMMETREL) 100 MG capsule Take 1 capsule (100 mg total) by mouth 2 (two) times daily. 08/14/16   Jimmy Footman, MD  ARIPiprazole (ABILIFY) 5 MG tablet Take 5 mg by mouth daily.    [provider]  aspirin EC 81 MG tablet Take 81 mg by mouth daily.    [provider]  cephALEXin (KEFLEX) 500 MG capsule Take 1 capsule (500 mg total) by mouth 3 (three) times daily. Patient not taking: Reported on 03/13/2017 01/25/17   Azalia Bilis, MD  clonazePAM (KLONOPIN) 0.5 MG tablet Take 0.5 mg by mouth 2 (two) times daily.     [provider]  divalproex (DEPAKOTE) 500 MG DR tablet Take 500 mg by mouth 2 (two) times daily.     [provider]  escitalopram (LEXAPRO) 20 MG tablet Take 30 mg by mouth daily.     [provider]  fenofibrate 160 MG tablet Take 160 mg by mouth daily.    [provider]  Fluticasone-Salmeterol (ADVAIR) 100-50 MCG/DOSE AEPB Inhale 1 puff into the lungs 2 (two) times daily.     [provider]  furosemide (LASIX) 20 MG tablet Take 20 mg by mouth daily.    [provider]  HYDROcodone-acetaminophen (NORCO/VICODIN) 5-325 MG tablet Take 1 tablet by mouth every 4 (four) hours as needed. 03/13/17   Lynelle Doctor,  Cletis Athens, MD  loperamide (IMODIUM) 2 MG capsule Take 1 capsule (2 mg total) by mouth 4 (four) times daily as needed for diarrhea or loose stools. 04/21/17   Burgess Amor, PA-C  Omega-3 Fatty Acids (FISH OIL PO) Take 1 capsule by mouth daily.    [provider]  pantoprazole (PROTONIX) 40 MG tablet Take 40 mg by mouth daily.     [provider]  risperiDONE (RISPERDAL) 3 MG tablet Take 1 tablet (3 mg total) by mouth at bedtime. 09/25/15   Minna Antis, MD  sucralfate (CARAFATE) 1 g tablet Take 1 g by mouth 4 (four) times daily -  with meals and at bedtime.    [provider]    Family History Family History  Problem Relation Age of Onset  . Heart attack Father   . Diabetes  Mother     Social History Social History   Tobacco Use  . Smoking status: Never Smoker  . Smokeless tobacco: Never Used  Substance Use Topics  . Alcohol use: No  . Drug use: No     Allergies   Penicillins   Review of Systems Review of Systems  Constitutional: Negative for fever.  HENT: Negative for congestion.   Eyes: Negative for redness.  Respiratory: Positive for cough and shortness of breath.   Cardiovascular: Positive for chest pain.  Gastrointestinal: Positive for abdominal pain and nausea.  Genitourinary: Negative for dysuria.  Musculoskeletal: Negative for back pain.  Skin: Negative for rash.  Neurological: Negative for headaches.  Hematological: Does not bruise/bleed easily.  Psychiatric/Behavioral: Negative for confusion.     Physical Exam Updated Vital Signs BP 109/69   Pulse 68   Temp 98 F (36.7 C) (Oral)   Resp 18   Ht 1.626 m (5\' 4" )   Wt 81.2 kg (179 lb)   SpO2 98%   BMI 30.73 kg/m   Physical Exam  Constitutional: She is oriented to person, place, and time. She appears well-developed and well-nourished. No distress.  HENT:  Head: Normocephalic and atraumatic.  Mouth/Throat: Oropharynx is clear and moist.  Eyes: Conjunctivae and EOM are normal. Pupils are equal, round, and reactive to light.  Neck: Normal range of motion. Neck supple.  Cardiovascular: Normal rate, regular rhythm and normal heart sounds.  Pulmonary/Chest: Effort normal and breath sounds normal. No respiratory distress.  Abdominal: Soft. Bowel sounds are normal. There is no tenderness.  Musculoskeletal: Normal range of motion. She exhibits no edema.  Neurological: She is alert and oriented to person, place, and time. No cranial nerve deficit or sensory deficit. She exhibits normal muscle tone. Coordination normal.  Skin: Skin is warm. She is not diaphoretic.  Nursing note and vitals reviewed.    ED Treatments / Results  Labs (all labs ordered are listed, but only  abnormal results are displayed) Labs Reviewed  BASIC METABOLIC PANEL - Abnormal; Notable for the following components:      Result Value   BUN 22 (*)    All other components within normal limits  CBC WITH DIFFERENTIAL/PLATELET - Abnormal; Notable for the following components:   RBC 3.71 (*)    MCV 103.8 (*)    All other components within normal limits  TROPONIN I    EKG  EKG Interpretation  Date/Time:  Wednesday April 25 2017 09:12:39 EST Ventricular Rate:  67 PR Interval:    QRS Duration: 92 QT Interval:  428 QTC Calculation: 452 R Axis:   28 Text Interpretation:  Normal sinus rhythm Ventricular premature complex  Low voltage, precordial leads Borderline T abnormalities, anterior leads Interpretation limited secondary to artifact Confirmed by Vanetta MuldersZackowski, Meeka Cartelli [id number]) on 04/25/2017 10:22:36 AM       Radiology Dg Chest 2 View  Result Date: 04/25/2017 CLINICAL DATA:  Acute chest pain today. EXAM: CHEST  2 VIEW COMPARISON:  01/24/2017 and prior radiographs FINDINGS: The cardiomediastinal silhouette is unremarkable. There is no evidence of focal airspace disease, pulmonary edema, suspicious pulmonary nodule/mass, pleural effusion, or pneumothorax. No acute bony abnormalities are identified. IMPRESSION: No active cardiopulmonary disease. Electronically Signed   By: Harmon PierJeffrey  Hu M.D.   On: 04/25/2017 10:00    Procedures Procedures (including critical care time)  Medications Ordered in ED Medications - No data to display   Initial Impression / Assessment and Plan / ED Course  I have reviewed the triage vital signs and the nursing notes.  Pertinent labs & imaging results that were available during my care of the patient were reviewed by me and considered in my medical decision making (see chart for details).    Workup for the chest pain troponin negative chest x-ray without any acute findings.  No concerns for an acute cardiac event.  EMS did give patient baby aspirin 324 mg and 1  nitro.  The pain did not resolve.  Patient still with complaint of pain.  I think based on the duration of the pain we can be comfortable with the one nitroglycerin.  Clearly patient is a group home patient but she is pretty certain about the onset of pain.  Patient given referral to cardiology for follow-up here in the [place] area.  Patient will return for any new or worse symptoms.  Final Clinical Impressions(s) / ED Diagnoses   Final diagnoses:  Precordial pain    ED Discharge Orders    None       Vanetta MuldersZackowski, Pama Roskos, MD 04/25/17 1453

## 2017-04-25 NOTE — Discharge Instructions (Signed)
Today's workup for your chest pain without any acute findings.  Make an appointment to follow-up with cardiology.  Return for any new or worse symptoms.  The

## 2017-04-27 ENCOUNTER — Encounter (HOSPITAL_COMMUNITY): Payer: Self-pay | Admitting: Cardiology

## 2017-04-27 ENCOUNTER — Emergency Department (HOSPITAL_COMMUNITY)
Admission: EM | Admit: 2017-04-27 | Discharge: 2017-04-27 | Disposition: A | Discharge status: Home / Self Care | Payer: Medicare Other | Attending: Emergency Medicine | Admitting: Emergency Medicine

## 2017-04-27 ENCOUNTER — Emergency Department (HOSPITAL_COMMUNITY): Payer: Medicare Other

## 2017-04-27 DIAGNOSIS — G809 Cerebral palsy, unspecified: Secondary | ICD-10-CM | POA: Diagnosis not present

## 2017-04-27 DIAGNOSIS — R079 Chest pain, unspecified: Secondary | ICD-10-CM | POA: Diagnosis present

## 2017-04-27 DIAGNOSIS — R0789 Other chest pain: Secondary | ICD-10-CM | POA: Insufficient documentation

## 2017-04-27 DIAGNOSIS — Z79899 Other long term (current) drug therapy: Secondary | ICD-10-CM | POA: Insufficient documentation

## 2017-04-27 DIAGNOSIS — J45909 Unspecified asthma, uncomplicated: Secondary | ICD-10-CM | POA: Insufficient documentation

## 2017-04-27 LAB — LIPASE, BLOOD: Lipase: 46 U/L (ref 11–51)

## 2017-04-27 LAB — COMPREHENSIVE METABOLIC PANEL
ALBUMIN: 3 g/dL — AB (ref 3.5–5.0)
ALT: 31 U/L (ref 14–54)
ANION GAP: 10 (ref 5–15)
AST: 59 U/L — ABNORMAL HIGH (ref 15–41)
Alkaline Phosphatase: 74 U/L (ref 38–126)
BILIRUBIN TOTAL: 0.3 mg/dL (ref 0.3–1.2)
BUN: 15 mg/dL (ref 6–20)
CO2: 25 mmol/L (ref 22–32)
Calcium: 9 mg/dL (ref 8.9–10.3)
Chloride: 103 mmol/L (ref 101–111)
Creatinine, Ser: 0.6 mg/dL (ref 0.44–1.00)
GFR calc Af Amer: >60 mL/min (ref >60)
Glucose, Bld: 97 mg/dL (ref 65–99)
POTASSIUM: 3.5 mmol/L (ref 3.5–5.1)
Sodium: 138 mmol/L (ref 135–145)
TOTAL PROTEIN: 6.3 g/dL — AB (ref 6.5–8.1)

## 2017-04-27 LAB — CBC WITH DIFFERENTIAL/PLATELET
BASOS ABS: 0 10*3/uL (ref 0.0–0.1)
Basophils Relative: 1 %
Eosinophils Absolute: 0.1 10*3/uL (ref 0.0–0.7)
Eosinophils Relative: 2 %
HCT: 36.7 % (ref 36.0–46.0)
Hemoglobin: 11.9 g/dL — ABNORMAL LOW (ref 12.0–15.0)
LYMPHS PCT: 30 %
Lymphs Abs: 1.5 10*3/uL (ref 0.7–4.0)
MCH: 33.4 pg (ref 26.0–34.0)
MCHC: 32.4 g/dL (ref 30.0–36.0)
MCV: 103.1 fL — AB (ref 78.0–100.0)
Monocytes Absolute: 1 10*3/uL (ref 0.1–1.0)
Monocytes Relative: 20 %
NEUTROS ABS: 2.3 10*3/uL (ref 1.7–7.7)
Neutrophils Relative %: 47 %
PLATELETS: 262 10*3/uL (ref 150–400)
RBC: 3.56 MIL/uL — AB (ref 3.87–5.11)
RDW: 13.6 % (ref 11.5–15.5)
WBC: 4.9 10*3/uL (ref 4.0–10.5)

## 2017-04-27 LAB — TROPONIN I: Troponin I: <0.03 ng/mL (ref <0.03)

## 2017-04-27 MED ORDER — SODIUM CHLORIDE 0.9 % IV BOLUS (SEPSIS)
1000.0000 mL | Freq: Once | INTRAVENOUS | Status: AC
  Administered 2017-04-27: 1000 mL via INTRAVENOUS

## 2017-04-27 NOTE — ED Provider Notes (Signed)
[facility] EMERGENCY DEPARTMENT Provider Note   CSN: [id number] Arrival date & time: 04/27/17  1220     History   Chief Complaint Chief Complaint  Patient presents with  . Chest Pain    HPI [name] is a 52 y.o. female.  HPI  Patient presents with concern of chest pain, cough. Patient acknowledges multiple medical issues including borderline personality disorder, mental retardation, COPD. She notes that over the past 3 weeks she has had persistent chest tightness and cough. She is unable to specify clear alleviating or exacerbating factors. She states that she has had abdominal pain for 3 or 4 months as well, this is unchanged. Patient is a poor historian, but it seems as though her retelling of recent events is clear.   Past Medical History:  Diagnosis Date  . Anxiety   . Asthma   . Borderline personality disorder (HCC)   . Cerebral palsy (HCC)   . Depression   . GERD (gastroesophageal reflux disease)   . Mild cognitive impairment   . Schizoaffective disorder (HCC)   . Wound abscess     Patient Active Problem List   Diagnosis Date Noted  . Severe recurrent major depression without psychotic features (HCC) 08/10/2016  . Overdose by acetaminophen 08/08/2016  . Schizoaffective disorder, bipolar type (HCC)   . 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 GERD (gastroesophageal reflux disease) 09/24/2014  . COPD (chronic obstructive pulmonary disease) (HCC) 09/24/2014  . Borderline personality disorder (HCC) 09/24/2014  . Cerebral palsy (HCC) 08/31/2014    Past Surgical History:  Procedure Laterality Date  . CHOLECYSTECTOMY    . INCISION AND DRAINAGE ABSCESS Right 01/02/2015   Procedure: INCISION AND DRAINAGE ABSCESS;  Surgeon: Tiney Rougealph Ely III, MD;  Location: [facility] ORS;  Service: General;  Laterality: Right;  . TONSILLECTOMY      OB History    Gravida Para Term Preterm AB Living   0 0 0 0 0 0   SAB TAB Ectopic Multiple Live Births   0 0 0 0          Home Medications    Prior to Admission medications   Medication Sig Start Date End Date Taking? Authorizing Provider  amantadine (SYMMETREL) 100 MG capsule Take 1 capsule (100 mg total) by mouth 2 (two) times daily. 08/14/16   Jimmy FootmanHernandez-Gonzalez, Andrea, MD  ARIPiprazole (ABILIFY) 5 MG tablet Take 5 mg by mouth daily.    [provider]  aspirin EC 81 MG tablet Take 81 mg by mouth daily.    [provider]  cephALEXin (KEFLEX) 500 MG capsule Take 1 capsule (500 mg total) by mouth 3 (three) times daily. Patient not taking: Reported on 03/13/2017 01/25/17   Azalia Bilisampos, Kevin, MD  clonazePAM (KLONOPIN) 0.5 MG tablet Take 0.5 mg by mouth 2 (two) times daily.     [provider]  divalproex (DEPAKOTE) 500 MG DR tablet Take 500 mg by mouth 2 (two) times daily.     [provider]  escitalopram (LEXAPRO) 20 MG tablet Take 30 mg by mouth daily.     [provider]  fenofibrate 160 MG tablet Take 160 mg by mouth daily.    [provider]  Fluticasone-Salmeterol (ADVAIR) 100-50 MCG/DOSE AEPB Inhale 1 puff into the lungs 2 (two) times daily.     [provider]  furosemide (LASIX) 20 MG tablet Take 20 mg by mouth daily.    [provider]  HYDROcodone-acetaminophen (NORCO/VICODIN) 5-325 MG tablet Take 1 tablet by mouth every  4 (four) hours as needed. 03/13/17   Linwood Dibbles, MD  loperamide (IMODIUM) 2 MG capsule Take 1 capsule (2 mg total) by mouth 4 (four) times daily as needed for diarrhea or loose stools. 04/21/17   Burgess Amor, PA-C  Omega-3 Fatty Acids (FISH OIL PO) Take 1 capsule by mouth daily.    [provider]  pantoprazole (PROTONIX) 40 MG tablet Take 40 mg by mouth daily.     [provider]  risperiDONE (RISPERDAL) 3 MG tablet Take 1 tablet (3 mg total) by mouth at bedtime. 09/25/15   Minna Antis, MD  sucralfate (CARAFATE) 1 g tablet Take 1 g by mouth 4 (four) times daily -  with meals and at bedtime.     [provider]    Family History Family History  Problem Relation Age of Onset  . Heart attack Father   . Diabetes Mother     Social History Social History   Tobacco Use  . Smoking status: Never Smoker  . Smokeless tobacco: Never Used  Substance Use Topics  . Alcohol use: No  . Drug use: No     Allergies   Penicillins   Review of Systems Review of Systems  Constitutional:       Per HPI, otherwise negative  HENT:       Per HPI, otherwise negative  Respiratory:       Per HPI, otherwise negative  Cardiovascular:       Per HPI, otherwise negative  Gastrointestinal: Positive for abdominal pain and nausea. Negative for vomiting.  Endocrine:       Negative aside from HPI  Genitourinary:       Neg aside from HPI   Musculoskeletal:       Per HPI, otherwise negative  Skin: Negative.   Neurological: Negative for syncope.  Psychiatric/Behavioral: The patient is nervous/anxious.      Physical Exam Updated Vital Signs BP (!) 113/97   Pulse 73   Resp (!) 25   Ht 5\' 4"  (1.626 m)   Wt 81.2 kg (179 lb)   SpO2 100%   BMI 30.73 kg/m   Physical Exam  Constitutional: She is oriented to person, place, and time. She appears well-developed and well-nourished. No distress.  HENT:  Head: Normocephalic and atraumatic.  Eyes: Conjunctivae and EOM are normal.  Cardiovascular: Normal rate and regular rhythm.  Pulmonary/Chest: Effort normal. No stridor. No respiratory distress. She has decreased breath sounds.  Abdominal: She exhibits no distension. There is no tenderness. There is no guarding.  Musculoskeletal: She exhibits no edema.  Neurological: She is alert and oriented to person, place, and time. No cranial nerve deficit.  Skin: Skin is warm and dry.  Psychiatric: She has a normal mood and affect.  Nursing note and vitals reviewed.    ED Treatments / Results  Labs (all labs ordered are listed, but only abnormal results are displayed) Labs Reviewed   COMPREHENSIVE METABOLIC PANEL - Abnormal; Notable for the following components:      Result Value   Total Protein 6.3 (*)    Albumin 3.0 (*)    AST 59 (*)    All other components within normal limits  CBC WITH DIFFERENTIAL/PLATELET - Abnormal; Notable for the following components:   RBC 3.56 (*)    Hemoglobin 11.9 (*)    MCV 103.1 (*)    All other components within normal limits  LIPASE, BLOOD  TROPONIN I    EKG  EKG Interpretation  Date/Time:  Friday  April 27 2017 12:28:31 EST Ventricular Rate:  73 PR Interval:    QRS Duration: 96 QT Interval:  414 QTC Calculation: 457 R Axis:   11 Text Interpretation:  Sinus rhythm Ventricular premature complex Low voltage, precordial leads Abnormal R-wave progression, early transition Abnormal inferior Q waves Borderline T abnormalities, anterior leads Baseline wander in lead(s) V1 Abnormal ekg Confirmed by Gerhard Munch [phone]) on 04/27/2017 12:40:25 PM       Radiology Dg Chest 2 View  Result Date: 04/27/2017 CLINICAL DATA:  Cough and chest congestion over the last 2 weeks. EXAM: CHEST  2 VIEW COMPARISON:  04/25/2017 FINDINGS: Heart size is normal. Mediastinal shadows are normal. The lungs are clear. No bronchial thickening. No infiltrate, mass, effusion or collapse. Pulmonary vascularity is normal. Mild spinal curvature. IMPRESSION: Normal chest Electronically Signed   By: Paulina Fusi M.D.   On: 04/27/2017 13:45    Procedures Procedures (including critical care time)  Medications Ordered in ED Medications  sodium chloride 0.9 % bolus 1,000 mL (1,000 mLs Intravenous New Bag/Given 04/27/17 1324)     Initial Impression / Assessment and Plan / ED Course  I have reviewed the triage vital signs and the nursing notes.  Pertinent labs & imaging results that were available during my care of the patient were reviewed by me and considered in my medical decision making (see chart for details).  Chart review after the initial evaluation  notable for 7 ED visits in 6 months.   2:28 PM Patient in no distress, sitting upright, speaking clearly. When informed that all results are reassuring, there is no evidence for acute new pathology, patient states that her legs hurt bilaterally. She states this while moving both freely, without any apparent distraction, pain, and with no visible deformity. I discussed the reassuring nature, and absent evidence for infection, ACS, pneumonia, or other acute new findings, patient will follow up with her primary care physician.   Final Clinical Impressions(s) / ED Diagnoses  Atypical chest pain   Gerhard Munch, MD 04/27/17 1430

## 2017-04-27 NOTE — ED Notes (Signed)
[place], they stated a ride is on the way to pick up patient

## 2017-04-27 NOTE — ED Triage Notes (Addendum)
Chest pain, cough  and left leg pain times 2 weeks.  Seen here with same 2 days ago.

## 2017-04-27 NOTE — ED Notes (Signed)
Called home to advise that patient was being discharged

## 2017-04-27 NOTE — ED Notes (Signed)
Patient ambulatory to restroom without any assistance.

## 2017-04-27 NOTE — Discharge Instructions (Signed)
As discussed, your evaluation today has been largely reassuring.  But, it is important that you monitor your condition carefully, and do not hesitate to return to the ED if you develop new, or concerning changes in your condition. ? ?Otherwise, please follow-up with your physician for appropriate ongoing care. ? ?

## 2017-05-02 ENCOUNTER — Other Ambulatory Visit: Payer: Self-pay

## 2017-05-02 ENCOUNTER — Encounter (HOSPITAL_COMMUNITY): Payer: Self-pay | Admitting: Emergency Medicine

## 2017-05-02 ENCOUNTER — Emergency Department (HOSPITAL_COMMUNITY)
Admission: EM | Admit: 2017-05-02 | Discharge: 2017-05-03 | Disposition: A | Discharge status: Home / Self Care | Payer: Medicare Other | Attending: Emergency Medicine | Admitting: Emergency Medicine

## 2017-05-02 ENCOUNTER — Emergency Department (HOSPITAL_COMMUNITY): Payer: Medicare Other

## 2017-05-02 DIAGNOSIS — F79 Unspecified intellectual disabilities: Secondary | ICD-10-CM | POA: Insufficient documentation

## 2017-05-02 DIAGNOSIS — R079 Chest pain, unspecified: Secondary | ICD-10-CM | POA: Insufficient documentation

## 2017-05-02 DIAGNOSIS — R45851 Suicidal ideations: Secondary | ICD-10-CM | POA: Diagnosis not present

## 2017-05-02 DIAGNOSIS — R0789 Other chest pain: Secondary | ICD-10-CM

## 2017-05-02 DIAGNOSIS — Z79899 Other long term (current) drug therapy: Secondary | ICD-10-CM | POA: Diagnosis not present

## 2017-05-02 DIAGNOSIS — F332 Major depressive disorder, recurrent severe without psychotic features: Secondary | ICD-10-CM | POA: Insufficient documentation

## 2017-05-02 DIAGNOSIS — Z7982 Long term (current) use of aspirin: Secondary | ICD-10-CM | POA: Diagnosis not present

## 2017-05-02 DIAGNOSIS — G809 Cerebral palsy, unspecified: Secondary | ICD-10-CM | POA: Diagnosis not present

## 2017-05-02 HISTORY — DX: Chronic obstructive pulmonary disease, unspecified: J44.9

## 2017-05-02 LAB — URINALYSIS, ROUTINE W REFLEX MICROSCOPIC
Bilirubin Urine: NEGATIVE
Glucose, UA: NEGATIVE mg/dL
Hgb urine dipstick: NEGATIVE
KETONES UR: NEGATIVE mg/dL
Leukocytes, UA: NEGATIVE
NITRITE: NEGATIVE
PROTEIN: 30 mg/dL — AB
Specific Gravity, Urine: 1.021 (ref 1.005–1.030)
pH: 5 (ref 5.0–8.0)

## 2017-05-02 LAB — CBC
HCT: 38.2 % (ref 36.0–46.0)
Hemoglobin: 12.8 g/dL (ref 12.0–15.0)
MCH: 33.4 pg (ref 26.0–34.0)
MCHC: 33.5 g/dL (ref 30.0–36.0)
MCV: 99.7 fL (ref 78.0–100.0)
Platelets: 268 10*3/uL (ref 150–400)
RBC: 3.83 MIL/uL — ABNORMAL LOW (ref 3.87–5.11)
RDW: 13.2 % (ref 11.5–15.5)
WBC: 6.6 10*3/uL (ref 4.0–10.5)

## 2017-05-02 LAB — ETHANOL: Alcohol, Ethyl (B): <10 mg/dL (ref <10)

## 2017-05-02 LAB — RAPID URINE DRUG SCREEN, HOSP PERFORMED
AMPHETAMINES: NOT DETECTED
BARBITURATES: NOT DETECTED
Benzodiazepines: POSITIVE — AB
COCAINE: NOT DETECTED
Opiates: NOT DETECTED
Tetrahydrocannabinol: NOT DETECTED

## 2017-05-02 LAB — BASIC METABOLIC PANEL
Anion gap: 12 (ref 5–15)
BUN: 17 mg/dL (ref 6–20)
CALCIUM: 9.2 mg/dL (ref 8.9–10.3)
CO2: 26 mmol/L (ref 22–32)
CREATININE: 0.82 mg/dL (ref 0.44–1.00)
Chloride: 101 mmol/L (ref 101–111)
GFR calc Af Amer: >60 mL/min (ref >60)
Glucose, Bld: 107 mg/dL — ABNORMAL HIGH (ref 65–99)
Potassium: 3.3 mmol/L — ABNORMAL LOW (ref 3.5–5.1)
Sodium: 139 mmol/L (ref 135–145)

## 2017-05-02 LAB — I-STAT TROPONIN, ED: TROPONIN I, POC: 0 ng/mL (ref 0.00–0.08)

## 2017-05-02 LAB — I-STAT BETA HCG BLOOD, ED (MC, WL, AP ONLY): I-stat hCG, quantitative: 8.7 m[IU]/mL — ABNORMAL HIGH (ref <5)

## 2017-05-02 MED ORDER — ARIPIPRAZOLE 5 MG PO TABS
5.0000 mg | ORAL_TABLET | Freq: Every day | ORAL | Status: DC
  Administered 2017-05-03: 5 mg via ORAL
  Filled 2017-05-02 (×2): qty 1

## 2017-05-02 MED ORDER — MOMETASONE FURO-FORMOTEROL FUM 100-5 MCG/ACT IN AERO
2.0000 | INHALATION_SPRAY | Freq: Two times a day (BID) | RESPIRATORY_TRACT | Status: DC
  Administered 2017-05-02: 2 via RESPIRATORY_TRACT
  Filled 2017-05-02: qty 8.8

## 2017-05-02 MED ORDER — PANTOPRAZOLE SODIUM 40 MG PO TBEC
40.0000 mg | DELAYED_RELEASE_TABLET | Freq: Every day | ORAL | Status: DC
  Administered 2017-05-02 – 2017-05-03 (×2): 40 mg via ORAL
  Filled 2017-05-02 (×2): qty 1

## 2017-05-02 MED ORDER — POTASSIUM CHLORIDE CRYS ER 20 MEQ PO TBCR
40.0000 meq | EXTENDED_RELEASE_TABLET | Freq: Once | ORAL | Status: DC
  Filled 2017-05-02: qty 2

## 2017-05-02 MED ORDER — FENOFIBRATE 160 MG PO TABS
160.0000 mg | ORAL_TABLET | Freq: Every day | ORAL | Status: DC
  Administered 2017-05-03: 160 mg via ORAL
  Filled 2017-05-02 (×2): qty 1

## 2017-05-02 MED ORDER — AMANTADINE HCL 100 MG PO CAPS
100.0000 mg | ORAL_CAPSULE | Freq: Two times a day (BID) | ORAL | Status: DC
  Administered 2017-05-02 – 2017-05-03 (×2): 100 mg via ORAL
  Filled 2017-05-02 (×4): qty 1

## 2017-05-02 MED ORDER — FUROSEMIDE 40 MG PO TABS
20.0000 mg | ORAL_TABLET | Freq: Every day | ORAL | Status: DC
  Administered 2017-05-02: 40 mg via ORAL
  Administered 2017-05-03: 20 mg via ORAL
  Filled 2017-05-02 (×2): qty 1

## 2017-05-02 MED ORDER — DIVALPROEX SODIUM 250 MG PO DR TAB
500.0000 mg | DELAYED_RELEASE_TABLET | Freq: Two times a day (BID) | ORAL | Status: DC
  Administered 2017-05-02 – 2017-05-03 (×2): 500 mg via ORAL
  Filled 2017-05-02 (×2): qty 2

## 2017-05-02 MED ORDER — ASPIRIN EC 81 MG PO TBEC
81.0000 mg | DELAYED_RELEASE_TABLET | Freq: Every day | ORAL | Status: DC
  Administered 2017-05-02 – 2017-05-03 (×2): 81 mg via ORAL
  Filled 2017-05-02 (×3): qty 1

## 2017-05-02 MED ORDER — ESCITALOPRAM OXALATE 20 MG PO TABS
30.0000 mg | ORAL_TABLET | Freq: Every day | ORAL | Status: DC
  Filled 2017-05-02 (×2): qty 1

## 2017-05-02 MED ORDER — POTASSIUM CHLORIDE CRYS ER 10 MEQ PO TBCR
10.0000 meq | EXTENDED_RELEASE_TABLET | Freq: Every day | ORAL | Status: DC
  Administered 2017-05-03: 10 meq via ORAL
  Filled 2017-05-02 (×2): qty 1

## 2017-05-02 MED ORDER — SUCRALFATE 1 G PO TABS
1.0000 g | ORAL_TABLET | Freq: Three times a day (TID) | ORAL | Status: DC
  Administered 2017-05-02 – 2017-05-03 (×3): 1 g via ORAL
  Filled 2017-05-02 (×3): qty 1

## 2017-05-02 MED ORDER — CLONAZEPAM 0.5 MG PO TABS
0.5000 mg | ORAL_TABLET | Freq: Two times a day (BID) | ORAL | Status: DC
  Administered 2017-05-02 – 2017-05-03 (×2): 0.5 mg via ORAL
  Filled 2017-05-02 (×2): qty 1

## 2017-05-02 NOTE — BH Assessment (Signed)
Tele Assessment Note   Patient Name: [name] MRN: [id number] Referring Physician: EDP Location of Patient: [facility] Location of Provider: Behavioral Health TTS Department  Quida Glasser is a 52 y.o. female who presented to [facility] on a voluntary basis with complaint of chest pain and also suicidal ideation.  Pt was last assessed by TTS in October 2018.  In that month, she was assessed three times by TTS.  Pt is a resident of [facility].    Pt reported that she has chest pains (she is medically cleared), and also that for ''a couple of days,'' she has felt suicidal with plan to overdose on medication.  Pt has attempted suicide one other time -- in April 2018, she was treated inpatient after intentionally overdosing on Tylenol in a self-described suicide attempt.  Pt reported that she feels despondent, irritated, and suicidal because of conflict with another resident of the group home.  In addition to suicidal ideation with plan, despondency, and irritability, Pt endorsed auditory hallucination (she would not elaborate).  Pt stated several times that she was upset at the group home because other residents annoyed her.  She requested inpatient care.  Pt has an extensive history of presenting to the ED for suicidal ideation.  Pt has reported in the past that she has received outpatient treatment from [facility] but she also denies taking psychotropic medication.    During assessment, Pt presented as alert and oriented.  She had good eye contact and was cooperative.  Pt was dressed in scrubs and appeared appropriately groomed.  Mood was sad, preoccupied with getting inpatient care, and irritable.  Pt's affect was mood congruent.  Pt endorsed suicidal ideation, despondency, irritability, insomnia, and other depressive symptoms.  Per report, she has a history of Schizoaffective Disorder, as well as Borderline Personality Disorder.  Pt's speech was normal in rate, rhythm, and volume. Pt's  thought processes were within normal range. Thought content was circumstantial.  There was no evidence of delusion.  Pt's memory and concentration were fair.  Impulse control was fair.  Insight and judgment were fair to poor.  Consulted with S. Rankin who recommended as follows:  Stabilize patient, collect collateral information from [facility], and then re-evaluate in AM with possible discharge to group home.  Diagnosis: 25.9 Schizoaffective Disorder; BPD  Past Medical History:  Past Medical History:  Diagnosis Date  . Anxiety   . Asthma   . Borderline personality disorder (HCC)   . Cerebral palsy (HCC)   . COPD (chronic obstructive pulmonary disease) (HCC)   . Depression   . GERD (gastroesophageal reflux disease)   . Mild cognitive impairment   . Schizoaffective disorder (HCC)   . Wound abscess     Past Surgical History:  Procedure Laterality Date  . CHOLECYSTECTOMY    . INCISION AND DRAINAGE ABSCESS Right 01/02/2015   Procedure: INCISION AND DRAINAGE ABSCESS;  Surgeon: Tiney Rouge III, MD;  Location: [facility] ORS;  Service: General;  Laterality: Right;  . TONSILLECTOMY      Family History:  Family History  Problem Relation Age of Onset  . Heart attack Father   . Diabetes Mother     Social History:  reports that  has never smoked. she has never used smokeless tobacco. She reports that she does not drink alcohol or use drugs.  Additional Social History:  Alcohol / Drug Use Pain Medications: See MAR Prescriptions: See MAR Over the Counter: See MAR History of alcohol / drug use?:  No history of alcohol / drug abuse  CIWA: CIWA-Ar BP: 113/77 Pulse Rate: 86 COWS:    PATIENT STRENGTHS: (choose at least two) General fund of knowledge Motivation for treatment/growth  Allergies:  Allergies  Allergen Reactions  . Penicillins Nausea And Vomiting and Other (See Comments)    Reaction:  No  Has patient had a PCN reaction causing immediate rash, facial/tongue/throat  swelling, SOB or lightheadedness with hypotension: No Has patient had a PCN reaction causing severe rash involving mucus membranes or skin necrosis: No Has patient had a PCN reaction that required hospitalization No Has patient had a PCN reaction occurring within the last 10 years: No If all of the above answers are "NO", then may proceed with Cephalosporin use.     Home Medications:  (Not in a hospital admission)  OB/GYN Status:  No LMP recorded. Patient has had an implant.  General Assessment Data Location of Assessment: [facility] TTS Assessment: In system Is this a Tele or Face-to-Face Assessment?: Tele Assessment Is this an Initial Assessment or a Re-assessment for this encounter?: Initial Assessment Marital status: Single Is patient pregnant?: No Pregnancy Status: No Living Arrangements: Group Home([facility]) Can pt return to current living arrangement?: Yes Admission Status: Voluntary Is patient capable of signing voluntary admission?: Yes Referral Source: Self/Family/Friend Insurance type: [facility] MCR     Crisis Care Plan Living Arrangements: Group Home([facility]) Name of Psychiatrist: Dr. Omelia Blackwater Name of Therapist: None  Education Status Is patient currently in school?: No  Risk to self with the past 6 months Suicidal Ideation: Yes-Currently Present Has patient been a risk to self within the past 6 months prior to admission? : Yes Suicidal Intent: Yes-Currently Present Has patient had any suicidal intent within the past 6 months prior to admission? : Yes Is patient at risk for suicide?: Yes Suicidal Plan?: Yes-Currently Present Has patient had any suicidal plan within the past 6 months prior to admission? : Yes Specify Current Suicidal Plan: Overdose Access to Means: Yes Previous Attempts/Gestures: Yes How many times?: 1 Triggers for Past Attempts: Unpredictable Intentional Self Injurious Behavior: None Family Suicide History: Unknown Recent stressful life  event(s): Conflict (Comment)(Conflict with other group home members) Persecutory voices/beliefs?: No Depression: Yes Depression Symptoms: Despondent, Feeling angry/irritable, Fatigue, Loss of interest in usual pleasures Substance abuse history and/or treatment for substance abuse?: No Suicide prevention information given to non-admitted patients: Not applicable  Risk to Others within the past 6 months Homicidal Ideation: No Does patient have any lifetime risk of violence toward others beyond the six months prior to admission? : No Thoughts of Harm to Others: Yes-Currently Present Comment - Thoughts of Harm to Others: Wants to ''shut down'' another resident Current Homicidal Intent: No Current Homicidal Plan: No Access to Homicidal Means: No History of harm to others?: No Assessment of Violence: None Noted Does patient have access to weapons?: No Criminal Charges Pending?: No Does patient have a court date: No Is patient on probation?: No  Psychosis Hallucinations: None noted Delusions: None noted  Mental Status Report Appearance/Hygiene: In scrubs, Unremarkable Eye Contact: Fair Motor Activity: Unremarkable, Freedom of movement Speech: Logical/coherent Level of Consciousness: Alert Mood: Preoccupied, Sad, Irritable Affect: Appropriate to circumstance Anxiety Level: None Thought Processes: Relevant, Circumstantial Judgement: Impaired Orientation: Person, Place, Time, Situation Obsessive Compulsive Thoughts/Behaviors: None  Cognitive Functioning Concentration: Normal Memory: Remote Intact, Recent Intact IQ: Average Insight: Poor Impulse Control: Fair Appetite: Fair Sleep: Decreased Total Hours of Sleep: 5 Vegetative Symptoms: None  ADLScreening [facility] Assessment Services)  Patient's cognitive ability adequate to safely complete daily activities?: Yes Patient able to express need for assistance with ADLs?: Yes Independently performs ADLs?: Yes (appropriate for  developmental age)  Prior Inpatient Therapy Prior Inpatient Therapy: Yes Prior Therapy Dates: Multiple Prior Therapy Facilty/Provider(s): Various Reason for Treatment: SI, overdose  Prior Outpatient Therapy Prior Outpatient Therapy: No Prior Therapy Facilty/Provider(s): Pt reportedly sees Dr. Headen Reason for Treatment: Deperssion, schizoafOmelia Blackwaterfective disorder, BPD Does patient have an ACCT team?: No Does patient have Intensive In-House Services?  : No Does patient have [facility] services? : No Does patient have P4CC services?: No  ADL Screening (condition at time of admission) Patient's cognitive ability adequate to safely complete daily activities?: Yes Is the patient deaf or have difficulty hearing?: No Does the patient have difficulty seeing, even when wearing glasses/contacts?: No Does the patient have difficulty concentrating, remembering, or making decisions?: No Patient able to express need for assistance with ADLs?: Yes Does the patient have difficulty dressing or bathing?: No Independently performs ADLs?: Yes (appropriate for developmental age) Does the patient have difficulty walking or climbing stairs?: No Weakness of Legs: None Weakness of Arms/Hands: None  Home Assistive Devices/Equipment Home Assistive Devices/Equipment: None  Therapy Consults (therapy consults require a physician order) PT Evaluation Needed: No OT Evalulation Needed: No SLP Evaluation Needed: No Abuse/Neglect Assessment (Assessment to be complete while patient is alone) Abuse/Neglect Assessment Can Be Completed: Yes Physical Abuse: Denies Verbal Abuse: Denies Sexual Abuse: Denies Exploitation of patient/patient's resources: Denies Self-Neglect: Denies Values / Beliefs Cultural Requests During Hospitalization: None Spiritual Requests During Hospitalization: None Consults Spiritual Care Consult Needed: No Social Work Consult Needed: No Merchant navy officerAdvance Directives (For Healthcare) Does Patient Have a  Medical Advance Directive?: No Would patient like information on creating a medical advance directive?: No - Patient declined    Additional Information 1:1 In Past 12 Months?: No CIRT Risk: No Elopement Risk: No Does patient have medical clearance?: Yes     Disposition:  Disposition Initial Assessment Completed for this Encounter: Yes Disposition of Patient: Other dispositions Other disposition(s): Other (Comment)(Stabilize, collect collateral from group home)  This service was provided via telemedicine using a 2-way, interactive audio and video technology.  Names of all persons participating in this telemedicine service and their role in this encounter. Name: Tempie DonningKimberly Wiltrout Role: Patient             Earline Mayotteugene T Kermitt Harjo 05/02/2017 5:49 PM

## 2017-05-02 NOTE — ED Provider Notes (Signed)
[facility] EMERGENCY DEPARTMENT Provider Note   CSN: [id number] Arrival date & time: 05/02/17  1212     History   Chief Complaint Chief Complaint  Patient presents with  . Chest Pain  . V70.1    HPI [name] is a 52 y.o. female.  The history is provided by the patient. The history is limited by the condition of the patient (Cognitive impairment).  Chest Pain      Pt was seen at 1310.  Per pt, c/o gradual onset and persistence of constant generalized chest pain for the past 3 weeks, worse over the past few days. Pt states she is "under a lot of stress" the past few days, and endorses SI and HI. Pt cannot be more specific regarding her symptoms. Pt was evaluated in the ED 5 days ago for her CP, with reassuring workup.      Past Medical History:  Diagnosis Date  . Anxiety   . Asthma   . Borderline personality disorder (HCC)   . Cerebral palsy (HCC)   . COPD (chronic obstructive pulmonary disease) (HCC)   . Depression   . GERD (gastroesophageal reflux disease)   . Mild cognitive impairment   . Schizoaffective disorder (HCC)   . Wound abscess     Patient Active Problem List   Diagnosis Date Noted  . Severe recurrent major depression without psychotic features (HCC) 08/10/2016  . Overdose by acetaminophen 08/08/2016  . Schizoaffective disorder, bipolar type (HCC)   . 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 GERD (gastroesophageal reflux disease) 09/24/2014  . COPD (chronic obstructive pulmonary disease) (HCC) 09/24/2014  . Borderline personality disorder (HCC) 09/24/2014  . Cerebral palsy (HCC) 08/31/2014    Past Surgical History:  Procedure Laterality Date  . CHOLECYSTECTOMY    . INCISION AND DRAINAGE ABSCESS Right 01/02/2015   Procedure: INCISION AND DRAINAGE ABSCESS;  Surgeon: Tiney Rouge III, MD;  Location: [facility] ORS;  Service: General;  Laterality: Right;  . TONSILLECTOMY      OB History    Gravida Para Term Preterm AB Living   0 0 0 0 0 0   SAB  TAB Ectopic Multiple Live Births   0 0 0 0         Home Medications    Prior to Admission medications   Medication Sig Start Date End Date Taking? Authorizing Provider  amantadine (SYMMETREL) 100 MG capsule Take 1 capsule (100 mg total) by mouth 2 (two) times daily. 08/14/16   Jimmy Footman, MD  ARIPiprazole (ABILIFY) 5 MG tablet Take 5 mg by mouth daily.    [provider]  aspirin EC 81 MG tablet Take 81 mg by mouth daily.    [provider]  cephALEXin (KEFLEX) 500 MG capsule Take 1 capsule (500 mg total) by mouth 3 (three) times daily. Patient not taking: Reported on 03/13/2017 01/25/17   Azalia Bilis, MD  clonazePAM (KLONOPIN) 0.5 MG tablet Take 0.5 mg by mouth 2 (two) times daily.     [provider]  divalproex (DEPAKOTE) 500 MG DR tablet Take 500 mg by mouth 2 (two) times daily.     [provider]  escitalopram (LEXAPRO) 20 MG tablet Take 30 mg by mouth daily.     [provider]  fenofibrate 160 MG tablet Take 160 mg by mouth daily.    [provider]  Fluticasone-Salmeterol (ADVAIR) 100-50 MCG/DOSE AEPB Inhale 1 puff into the lungs 2 (two) times daily.     [provider]  furosemide (LASIX) 20 MG tablet Take 20 mg by mouth daily.    [provider]  HYDROcodone-acetaminophen (NORCO/VICODIN) 5-325 MG tablet Take 1 tablet by mouth every 4 (four) hours as needed. 03/13/17   Linwood DibblesKnapp, Jon, MD  loperamide (IMODIUM) 2 MG capsule Take 1 capsule (2 mg total) by mouth 4 (four) times daily as needed for diarrhea or loose stools. 04/21/17   Burgess AmorIdol, Julie, PA-C  Omega-3 Fatty Acids (FISH OIL PO) Take 1 capsule by mouth daily.    [provider]  pantoprazole (PROTONIX) 40 MG tablet Take 40 mg by mouth daily.     [provider]  potassium chloride (K-DUR) 10 MEQ tablet Take 10 mEq by mouth daily.    [provider]  risperiDONE (RISPERDAL) 3 MG tablet Take 1 tablet (3 mg total) by mouth  at bedtime. 09/25/15   Minna AntisPaduchowski, Kevin, MD  sucralfate (CARAFATE) 1 g tablet Take 1 g by mouth 4 (four) times daily -  with meals and at bedtime.    [provider]    Family History Family History  Problem Relation Age of Onset  . Heart attack Father   . Diabetes Mother     Social History Social History   Tobacco Use  . Smoking status: Never Smoker  . Smokeless tobacco: Never Used  Substance Use Topics  . Alcohol use: No  . Drug use: No     Allergies   Penicillins   Review of Systems Review of Systems  Unable to perform ROS: Other  Cardiovascular: Positive for chest pain.     Physical Exam Updated Vital Signs BP (!) 113/97   Pulse 100   Temp 97.6 F (36.4 C) (Oral)   Resp 17   Ht 5\' 3"  (1.6 m)   Wt 81.2 kg (179 lb)   SpO2 96%   BMI 31.71 kg/m   Physical Exam 1315: Physical examination:  Nursing notes reviewed; Vital signs and O2 SAT reviewed;  Constitutional: Well developed, Well nourished, Well hydrated, In no acute distress; Head:  Normocephalic, atraumatic; Eyes: EOMI, PERRL, No scleral icterus; ENMT: Mouth and pharynx normal, Mucous membranes moist; Neck: Supple, Full range of motion, No lymphadenopathy; Cardiovascular: Regular rate and rhythm, No gallop; Respiratory: Breath sounds clear & equal bilaterally, No wheezes.  Speaking full sentences with ease, Normal respiratory effort/excursion; Chest: Nontender, Movement normal; Abdomen: Soft, Nontender, Nondistended, Normal bowel sounds; Genitourinary: No CVA tenderness; Extremities: Pulses normal, No tenderness, No edema, No calf edema or asymmetry.; Neuro: Awake, alert, rambling historian. Major CN grossly intact.  Speech clear. No gross focal motor deficits in extremities.; Skin: Color normal, Warm, Dry.   ED Treatments / Results  Labs (all labs ordered are listed, but only abnormal results are displayed)   EKG  EKG Interpretation  Date/Time:  Wednesday May 02 2017 12:35:33  EST Ventricular Rate:  93 PR Interval:  158 QRS Duration: 80 QT Interval:  360 QTC Calculation: 447 R Axis:   33 Text Interpretation:  Normal sinus rhythm Nonspecific ST and T wave abnormality Baseline wander Artifact When compared with ECG of 04/27/2017 No significant change was found Confirmed by Samuel JesterMcManus, Shawndra Clute [phone]([id number]) on 05/02/2017 1:29:19 PM       Radiology   Procedures Procedures (including critical care time)  Medications Ordered in ED Medications - No data to display   Initial Impression / Assessment and Plan / ED Course  I have reviewed the triage vital signs and the nursing notes.  Pertinent labs & imaging results that were  available during my care of the patient were reviewed by me and considered in my medical decision making (see chart for details).  MDM Reviewed: previous chart, nursing note and vitals Reviewed previous: labs and ECG Interpretation: labs, ECG and x-ray   Results for orders placed or performed during the hospital encounter of 05/02/17  Basic metabolic panel  Result Value Ref Range   Sodium 139 135 - 145 mmol/L   Potassium 3.3 (L) 3.5 - 5.1 mmol/L   Chloride 101 101 - 111 mmol/L   CO2 26 22 - 32 mmol/L   Glucose, Bld 107 (H) 65 - 99 mg/dL   BUN 17 6 - 20 mg/dL   Creatinine, Ser 1.61 0.44 - 1.00 mg/dL   Calcium 9.2 8.9 - 09.6 mg/dL   GFR calc non Af Amer >60 >60 mL/min   GFR calc Af Amer >60 >60 mL/min   Anion gap 12 5 - 15  CBC  Result Value Ref Range   WBC 6.6 4.0 - 10.5 K/uL   RBC 3.83 (L) 3.87 - 5.11 MIL/uL   Hemoglobin 12.8 12.0 - 15.0 g/dL   HCT 04.5 40.9 - 81.1 %   MCV 99.7 78.0 - 100.0 fL   MCH 33.4 26.0 - 34.0 pg   MCHC 33.5 30.0 - 36.0 g/dL   RDW 91.4 78.2 - 95.6 %   Platelets 268 150 - 400 K/uL  Urine rapid drug screen (hosp performed)  Result Value Ref Range   Opiates NONE DETECTED NONE DETECTED   Cocaine NONE DETECTED NONE DETECTED   Benzodiazepines POSITIVE (A) NONE DETECTED   Amphetamines NONE DETECTED NONE  DETECTED   Tetrahydrocannabinol NONE DETECTED NONE DETECTED   Barbiturates NONE DETECTED NONE DETECTED  Urinalysis, Routine w reflex microscopic  Result Value Ref Range   Color, Urine AMBER (A) YELLOW   APPearance CLOUDY (A) CLEAR   Specific Gravity, Urine 1.021 1.005 - 1.030   pH 5.0 5.0 - 8.0   Glucose, UA NEGATIVE NEGATIVE mg/dL   Hgb urine dipstick NEGATIVE NEGATIVE   Bilirubin Urine NEGATIVE NEGATIVE   Ketones, ur NEGATIVE NEGATIVE mg/dL   Protein, ur 30 (A) NEGATIVE mg/dL   Nitrite NEGATIVE NEGATIVE   Leukocytes, UA NEGATIVE NEGATIVE   RBC / HPF 0-5 0 - 5 RBC/hpf   WBC, UA 6-30 0 - 5 WBC/hpf   Bacteria, UA RARE (A) NONE SEEN   Squamous Epithelial / LPF TOO NUMEROUS TO COUNT (A) NONE SEEN   Mucus PRESENT    Hyaline Casts, UA PRESENT    Ca Oxalate Crys, UA PRESENT   Ethanol  Result Value Ref Range   Alcohol, Ethyl (B) <10 <10 mg/dL  I-stat troponin, ED  Result Value Ref Range   Troponin i, poc 0.00 0.00 - 0.08 ng/mL   Comment 3          I-Stat beta hCG blood, ED  Result Value Ref Range   I-stat hCG, quantitative 8.7 (H) <5 mIU/mL   Comment 3           Dg Chest 2 View Result Date: 05/02/2017 CLINICAL DATA:  Chest pain EXAM: CHEST  2 VIEW COMPARISON:  04/27/2017 FINDINGS: Lateral view degraded by patient arm position. Mild right hemidiaphragm elevation. Numerous leads and wires project over the chest. Apical lordotic frontal radiograph. Midline trachea. Normal heart size and mediastinal contours. No pleural effusion or pneumothorax. Clear lungs. IMPRESSION: No acute cardiopulmonary disease. Electronically Signed   By: Jeronimo Greaves M.D.   On: 05/02/2017 13:27  1515:  I stat quant ordered in error by ED RN; slight elevation is not significant. Udip is contaminated. Potassium repleted PO. Will have TTS evaluate.   1815:  TTS has evaluated pt: pt to remain in ED overnight for re-evaluation tomorrow morning. Holding orders written.   Final Clinical Impressions(s) / ED  Diagnoses   Final diagnoses:  None    ED Discharge Orders    None        Samuel Jester, DO 05/02/17 2129

## 2017-05-02 NOTE — ED Triage Notes (Signed)
Patient reports chest pain x 3 weeks. Patient states she has had increasing pain last night and today with bilateral leg pain.

## 2017-05-02 NOTE — ED Notes (Signed)
Dropped first asa on floor.

## 2017-05-02 NOTE — ED Notes (Signed)
AC aware needing meds 

## 2017-05-02 NOTE — ED Triage Notes (Signed)
Patient states she is suicidal. Patient reports she feels like hurting herself and others.

## 2017-05-03 ENCOUNTER — Encounter (HOSPITAL_COMMUNITY): Payer: Self-pay | Admitting: Registered Nurse

## 2017-05-03 DIAGNOSIS — R079 Chest pain, unspecified: Secondary | ICD-10-CM | POA: Diagnosis not present

## 2017-05-03 MED ORDER — ESCITALOPRAM OXALATE 10 MG PO TABS
30.0000 mg | ORAL_TABLET | Freq: Every day | ORAL | Status: DC
  Administered 2017-05-03: 30 mg via ORAL
  Filled 2017-05-03 (×2): qty 3

## 2017-05-03 MED ORDER — GUAIFENESIN-DM 100-10 MG/5ML PO SYRP
5.0000 mL | ORAL_SOLUTION | Freq: Once | ORAL | Status: AC
  Administered 2017-05-03: 5 mL via ORAL
  Filled 2017-05-03: qty 5

## 2017-05-03 MED ORDER — GUAIFENESIN-DM 100-10 MG/5ML PO SYRP: 5.0000 mL | ORAL_SOLUTION | ORAL | Status: DC | PRN

## 2017-05-03 NOTE — ED Notes (Signed)
Re evaluation by TTS at this time

## 2017-05-03 NOTE — ED Notes (Signed)
Pt with frequent hacking cough. Coarse lung sounds right upper lobe. EDP notified .  See new order

## 2017-05-03 NOTE — ED Provider Notes (Signed)
Pt has been re-evaluated by TTS:  Pt denies SI/HI, does not meet inpt criteria at this time and can be discharged back to group home. Will d/c stable.    Samuel JesterMcManus, Lamar Meter, DO 05/03/17 1711

## 2017-05-03 NOTE — ED Notes (Signed)
Dr Clarene DukeMcmanus notified of recommendation of discharge. [facility] notified. Will  Call back when transport available
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
## 2017-05-03 NOTE — Discharge Instructions (Signed)
Take your usual prescriptions as previously directed.  Apply moist heat or ice to the area(s) of discomfort, for 15 minutes at a time, several times per day for the next few days.  Do not fall asleep on a heating or ice pack.  Call your regular medical doctor tomorrow to schedule a follow up appointment in the next 3 days.  Return to the Emergency Department immediately if worsening.

## 2017-05-23 ENCOUNTER — Emergency Department: Payer: Medicare Other

## 2017-05-23 ENCOUNTER — Emergency Department
Admission: EM | Admit: 2017-05-23 | Discharge: 2017-05-23 | Disposition: A | Discharge status: Home / Self Care | Payer: Medicare Other | Attending: Emergency Medicine | Admitting: Emergency Medicine

## 2017-05-23 ENCOUNTER — Encounter: Payer: Self-pay | Admitting: Emergency Medicine

## 2017-05-23 DIAGNOSIS — J40 Bronchitis, not specified as acute or chronic: Secondary | ICD-10-CM | POA: Diagnosis not present

## 2017-05-23 DIAGNOSIS — Z7982 Long term (current) use of aspirin: Secondary | ICD-10-CM | POA: Diagnosis not present

## 2017-05-23 DIAGNOSIS — R05 Cough: Secondary | ICD-10-CM | POA: Diagnosis present

## 2017-05-23 DIAGNOSIS — Z79899 Other long term (current) drug therapy: Secondary | ICD-10-CM | POA: Insufficient documentation

## 2017-05-23 DIAGNOSIS — J449 Chronic obstructive pulmonary disease, unspecified: Secondary | ICD-10-CM | POA: Insufficient documentation

## 2017-05-23 MED ORDER — AZITHROMYCIN 250 MG PO TABS: ORAL_TABLET | ORAL | 0 refills | Status: DC

## 2017-05-23 MED ORDER — BENZONATATE 100 MG PO CAPS: 100.0000 mg | ORAL_CAPSULE | Freq: Three times a day (TID) | ORAL | 0 refills | Status: DC | PRN

## 2017-05-23 NOTE — ED Triage Notes (Signed)
Pt in via EMS. EMS reports pt with productive cough for 3 weeks. , FSBS 100, no resp distress noted. Pt denies pain.

## 2017-05-23 NOTE — ED Triage Notes (Signed)
Pt comes into the ED via [facility] c/o cough.  Patient states she has had the cough for 3 weeks with no relief.  Patient has even and unlabored respirations at this time.  Patient recently saw her PCP and was placed on Mucinex but has had no relief.  Patient in NAD at this time and denies any fevers or shortness of breath.

## 2017-05-23 NOTE — ED Notes (Signed)
Patient reports cough x3 weeks with thick yellow phlegm production. Denies fever but states shes been having "hot flashes".

## 2017-05-23 NOTE — ED Provider Notes (Signed)
[facility] Emergency Department Provider Note  ____________________________________________  Time seen: Approximately 3:19 PM  I have reviewed the triage vital signs and the nursing notes.   HISTORY  Chief Complaint Cough    HPI [name] is a 52 y.o. female presents to the emergency department for evaluation of productive cough with clear and yellow sputum for 3 weeks.  Her fianc is sick with a cough as well.  She does not smoke.  She is hungry and would like a lunch tray.  No fever, nasal congestion, shortness of breath, chest pain.   Past Medical History:  Diagnosis Date  . Anxiety   . Asthma   . Borderline personality disorder (HCC)   . Cerebral palsy (HCC)   . COPD (chronic obstructive pulmonary disease) (HCC)   . Depression   . GERD (gastroesophageal reflux disease)   . Mild cognitive impairment   . Schizoaffective disorder (HCC)   . Wound abscess     Patient Active Problem List   Diagnosis Date Noted  . Severe recurrent major depression without psychotic features (HCC) 08/10/2016  . Overdose by acetaminophen 08/08/2016  . Schizoaffective disorder, bipolar type (HCC)   . 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 GERD (gastroesophageal reflux disease) 09/24/2014  . COPD (chronic obstructive pulmonary disease) (HCC) 09/24/2014  . Borderline personality disorder (HCC) 09/24/2014  . Cerebral palsy (HCC) 08/31/2014    Past Surgical History:  Procedure Laterality Date  . CHOLECYSTECTOMY    . INCISION AND DRAINAGE ABSCESS Right 01/02/2015   Procedure: INCISION AND DRAINAGE ABSCESS;  Surgeon: Tiney Rouge III, MD;  Location: [facility] ORS;  Service: General;  Laterality: Right;  . TONSILLECTOMY      Prior to Admission medications   Medication Sig Start Date End Date Taking? Authorizing Provider  amantadine (SYMMETREL) 100 MG capsule Take 1 capsule (100 mg total) by mouth 2 (two) times daily. 08/14/16   Jimmy Footman, MD   ARIPiprazole (ABILIFY) 5 MG tablet Take 5 mg by mouth daily.    [provider]  aspirin EC 81 MG tablet Take 81 mg by mouth daily.    [provider]  azithromycin (ZITHROMAX Z-PAK) 250 MG tablet Take 2 tablets (500 mg) on  Day 1,  followed by 1 tablet (250 mg) once daily on Days 2 through 5. 05/23/17   Enid Derry, PA-C  benzonatate (TESSALON PERLES) 100 MG capsule Take 1 capsule (100 mg total) by mouth 3 (three) times daily as needed for cough. 05/23/17 05/23/18  Enid Derry, PA-C  cephALEXin (KEFLEX) 500 MG capsule Take 1 capsule (500 mg total) by mouth 3 (three) times daily. Patient not taking: Reported on 03/13/2017 01/25/17   Azalia Bilis, MD  cetirizine (ZYRTEC) 10 MG tablet Take 10 mg by mouth daily.    [provider]  clonazePAM (KLONOPIN) 0.5 MG tablet Take 0.5 mg by mouth 2 (two) times daily.     [provider]  divalproex (DEPAKOTE) 500 MG DR tablet Take 500 mg by mouth 2 (two) times daily.     [provider]  escitalopram (LEXAPRO) 20 MG tablet Take 30 mg by mouth daily.     [provider]  fenofibrate 160 MG tablet Take 160 mg by mouth daily.    [provider]  Fluticasone-Salmeterol (ADVAIR) 100-50 MCG/DOSE AEPB Inhale 1 puff into the lungs 2 (two) times daily.     [provider]  furosemide (LASIX) 20 MG tablet Take 20 mg by mouth daily.    [provider]  HYDROcodone-acetaminophen (NORCO/VICODIN) 5-325 MG tablet Take 1 tablet by mouth every 4 (four) hours as needed. Patient not taking: Reported on 05/02/2017 03/13/17   Linwood Dibbles, MD  hydrocortisone 2.5 % cream Apply 1 application topically 2 (two) times daily.    [provider]  loperamide (IMODIUM) 2 MG capsule Take 1 capsule (2 mg total) by mouth 4 (four) times daily as needed for diarrhea or loose stools. 04/21/17   Burgess Amor, PA-C  Omega-3 Fatty Acids (FISH OIL PO) Take 1 capsule by mouth daily.    [provider]   pantoprazole (PROTONIX) 40 MG tablet Take 40 mg by mouth daily.     [provider]  potassium chloride (K-DUR) 10 MEQ tablet Take 10 mEq by mouth daily.    [provider]  sucralfate (CARAFATE) 1 g tablet Take 1 g by mouth 4 (four) times daily -  with meals and at bedtime.    [provider]    Allergies Penicillins  Family History  Problem Relation Age of Onset  . Heart attack Father   . Diabetes Mother     Social History Social History   Tobacco Use  . Smoking status: Never Smoker  . Smokeless tobacco: Never Used  Substance Use Topics  . Alcohol use: No  . Drug use: No     Review of Systems  Constitutional: No fever/chills Eyes: No visual changes. No discharge. ENT: Negative for congestion and rhinorrhea. Cardiovascular: No chest pain. Respiratory: Positive for cough. No SOB. Gastrointestinal:  No nausea, no vomiting.   Musculoskeletal: Negative for musculoskeletal pain. Skin: Negative for rash, abrasions, lacerations, ecchymosis.  ____________________________________________   PHYSICAL EXAM:  VITAL SIGNS: ED Triage Vitals  Enc Vitals Group     BP 05/23/17 1401 100/63     Pulse Rate 05/23/17 1401 81     Resp 05/23/17 1401 20     Temp 05/23/17 1401 98.4 F (36.9 C)     Temp Source 05/23/17 1401 Oral     SpO2 05/23/17 1401 99 %     Weight 05/23/17 1402 179 lb (81.2 kg)     Height 05/23/17 1402 5\' 3"  (1.6 m)     Head Circumference --      Peak Flow --      Pain Score --      Pain Loc --      Pain Edu? --      Excl. in GC? --      Constitutional: Alert and oriented. Well appearing and in no acute distress. Eyes: Conjunctivae are normal. PERRL. EOMI. No discharge. Head: Atraumatic. ENT: No frontal and maxillary sinus tenderness.      Ears: Tympanic membranes pearly gray with good landmarks. No discharge.      Nose: No congestion/rhinnorhea.      Mouth/Throat: Mucous membranes are moist. Oropharynx non-erythematous.  Tonsils not enlarged. No exudates. Uvula midline. Neck: No stridor.   Hematological/Lymphatic/Immunilogical: No cervical lymphadenopathy. Cardiovascular: Normal rate, regular rhythm.  Good peripheral circulation. Respiratory: Normal respiratory effort without tachypnea or retractions. Lungs CTAB. Good air entry to the bases with no decreased or absent breath sounds. Gastrointestinal: Bowel sounds 4 quadrants. Soft and nontender to palpation. No guarding or rigidity. No palpable masses. No distention. Musculoskeletal: Full range of motion to all extremities. No gross deformities appreciated. Neurologic:  Normal speech and language. No gross focal neurologic deficits are appreciated.  Skin:  Skin is warm, dry and intact. No rash noted.   ____________________________________________  LABS (all labs ordered are listed, but only abnormal results are displayed)  Labs Reviewed - No data to display ____________________________________________  EKG   ____________________________________________  RADIOLOGY Lexine BatonI, Demichael Traum, personally viewed and evaluated these images (plain radiographs) as part of my medical decision making, as well as reviewing the written report by the radiologist.  Dg Chest 2 View  Result Date: 05/23/2017 CLINICAL DATA:  Cough 3 weeks EXAM: CHEST  2 VIEW COMPARISON:  07/15/2016 FINDINGS: The heart size and mediastinal contours are within normal limits. Both lungs are clear. The visualized skeletal structures are unremarkable. IMPRESSION: No active cardiopulmonary disease. Electronically Signed   By: Marlan Palauharles  Clark M.D.   On: 05/23/2017 14:37    ____________________________________________    PROCEDURES  Procedure(s) performed:    Procedures    Medications - No data to display   ____________________________________________   INITIAL IMPRESSION / ASSESSMENT AND PLAN / ED COURSE  Pertinent labs & imaging results that were available during my care of the  patient were reviewed by me and considered in my medical decision making (see chart for details).  Review of the [place] CSRS was performed in accordance of the [facility] prior to dispensing any controlled drugs.    Patient's diagnosis is consistent with bronchitis. Vital signs and exam are reassuring.  Chest x-ray negative for acute cardiopulmonary processes.  Patient is on the phone with tech support and is asking about lunch.  Patient appears well and is staying well hydrated. Patient feels comfortable going home. Patient will be discharged home with prescriptions for azithromycin and Tessalon Perles. Patient is to follow up with PCP as needed or otherwise directed. Patient is given ED precautions to return to the ED for any worsening or new symptoms.     ____________________________________________  FINAL CLINICAL IMPRESSION(S) / ED DIAGNOSES  Final diagnoses:  Bronchitis      NEW MEDICATIONS STARTED DURING THIS VISIT:  ED Discharge Orders        Ordered    azithromycin (ZITHROMAX Z-PAK) 250 MG tablet     05/23/17 1516    benzonatate (TESSALON PERLES) 100 MG capsule  3 times daily PRN     05/23/17 1516          This chart was dictated using voice recognition software/Dragon. Despite best efforts to proofread, errors can occur which can change the meaning. Any change was purely unintentional.    Enid DerryWagner, Laine Fonner, PA-C 05/23/17 1553    Pershing ProudSchaevitz, Myra Rudeavid Matthew, MD 05/24/17 2204

## 2017-09-08 ENCOUNTER — Emergency Department
Admission: EM | Admit: 2017-09-08 | Discharge: 2017-09-09 | Disposition: A | Discharge status: Home / Self Care | Payer: Medicare Other | Attending: Emergency Medicine | Admitting: Emergency Medicine

## 2017-09-08 ENCOUNTER — Other Ambulatory Visit: Payer: Self-pay

## 2017-09-08 DIAGNOSIS — Z79899 Other long term (current) drug therapy: Secondary | ICD-10-CM | POA: Diagnosis not present

## 2017-09-08 DIAGNOSIS — F32A Depression, unspecified: Secondary | ICD-10-CM

## 2017-09-08 DIAGNOSIS — F329 Major depressive disorder, single episode, unspecified: Secondary | ICD-10-CM | POA: Diagnosis not present

## 2017-09-08 DIAGNOSIS — Z7982 Long term (current) use of aspirin: Secondary | ICD-10-CM | POA: Diagnosis not present

## 2017-09-08 DIAGNOSIS — J449 Chronic obstructive pulmonary disease, unspecified: Secondary | ICD-10-CM | POA: Insufficient documentation

## 2017-09-08 DIAGNOSIS — R45851 Suicidal ideations: Secondary | ICD-10-CM | POA: Insufficient documentation

## 2017-09-08 LAB — COMPREHENSIVE METABOLIC PANEL
ALBUMIN: 3.9 g/dL (ref 3.5–5.0)
ALT: 13 U/L — AB (ref 14–54)
AST: 20 U/L (ref 15–41)
Alkaline Phosphatase: 65 U/L (ref 38–126)
Anion gap: 7 (ref 5–15)
BUN: 17 mg/dL (ref 6–20)
CHLORIDE: 104 mmol/L (ref 101–111)
CO2: 29 mmol/L (ref 22–32)
CREATININE: 0.72 mg/dL (ref 0.44–1.00)
Calcium: 9.4 mg/dL (ref 8.9–10.3)
GFR calc Af Amer: >60 mL/min (ref >60)
GFR calc non Af Amer: >60 mL/min (ref >60)
GLUCOSE: 100 mg/dL — AB (ref 65–99)
POTASSIUM: 3.2 mmol/L — AB (ref 3.5–5.1)
Sodium: 140 mmol/L (ref 135–145)
Total Bilirubin: 0.4 mg/dL (ref 0.3–1.2)
Total Protein: 7.4 g/dL (ref 6.5–8.1)

## 2017-09-08 LAB — CBC
HCT: 38.5 % (ref 35.0–47.0)
HEMOGLOBIN: 13.3 g/dL (ref 12.0–16.0)
MCH: 33.3 pg (ref 26.0–34.0)
MCHC: 34.5 g/dL (ref 32.0–36.0)
MCV: 96.5 fL (ref 80.0–100.0)
PLATELETS: 260 10*3/uL (ref 150–440)
RBC: 3.99 MIL/uL (ref 3.80–5.20)
RDW: 13 % (ref 11.5–14.5)
WBC: 6 10*3/uL (ref 3.6–11.0)

## 2017-09-08 LAB — ETHANOL: Alcohol, Ethyl (B): <10 mg/dL (ref <10)

## 2017-09-08 LAB — ACETAMINOPHEN LEVEL: Acetaminophen (Tylenol), Serum: <10 ug/mL — ABNORMAL LOW (ref 10–30)

## 2017-09-08 LAB — SALICYLATE LEVEL: Salicylate Lvl: <7 mg/dL (ref 2.8–30.0)

## 2017-09-08 NOTE — ED Notes (Signed)
Pt attempted to provide urine sample but was unable to 

## 2017-09-08 NOTE — ED Provider Notes (Signed)
[facility] Emergency Department Provider Note   ____________________________________________    I have reviewed the triage vital signs and the nursing notes.   HISTORY  Chief Complaint Psychiatric Evaluation     HPI [name] is a 52 y.o. female with a history of borderline personality disorder, cognitive impairment, schizoaffective disorder who presents today for psychiatric evaluation.  Patient states she is upset because her boyfriend said he would be home all day but he went for a walk.  Because of this she wanted to exercise her right to call 911 and told him that she was planning on taking pills.  She does not know what pills she was planning on taking.  Patient has a history of frequent visits to the emergency department for suicidal ideation.  She appears to be at her baseline currently.   Past Medical History:  Diagnosis Date  . Anxiety   . Asthma   . Borderline personality disorder (HCC)   . Cerebral palsy (HCC)   . COPD (chronic obstructive pulmonary disease) (HCC)   . Depression   . GERD (gastroesophageal reflux disease)   . Mild cognitive impairment   . Schizoaffective disorder (HCC)   . Wound abscess     Patient Active Problem List   Diagnosis Date Noted  . Severe recurrent major depression without psychotic features (HCC) 08/10/2016  . Overdose by acetaminophen 08/08/2016  . Schizoaffective disorder, bipolar type (HCC)   . 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 GERD (gastroesophageal reflux disease) 09/24/2014  . COPD (chronic obstructive pulmonary disease) (HCC) 09/24/2014  . Borderline personality disorder (HCC) 09/24/2014  . Cerebral palsy (HCC) 08/31/2014    Past Surgical History:  Procedure Laterality Date  . CHOLECYSTECTOMY    . INCISION AND DRAINAGE ABSCESS Right 01/02/2015   Procedure: INCISION AND DRAINAGE ABSCESS;  Surgeon: Tiney Rouge III, MD;  Location: [facility] ORS;  Service: General;  Laterality: Right;    . TONSILLECTOMY      Prior to Admission medications   Medication Sig Start Date End Date Taking? Authorizing Provider  amantadine (SYMMETREL) 100 MG capsule Take 1 capsule (100 mg total) by mouth 2 (two) times daily. 08/14/16   Jimmy Footman, MD  ARIPiprazole (ABILIFY) 5 MG tablet Take 5 mg by mouth daily.    [provider]  aspirin EC 81 MG tablet Take 81 mg by mouth daily.    [provider]  azithromycin (ZITHROMAX Z-PAK) 250 MG tablet Take 2 tablets (500 mg) on  Day 1,  followed by 1 tablet (250 mg) once daily on Days 2 through 5. 05/23/17   Enid Derry, PA-C  benzonatate (TESSALON PERLES) 100 MG capsule Take 1 capsule (100 mg total) by mouth 3 (three) times daily as needed for cough. 05/23/17 05/23/18  Enid Derry, PA-C  cephALEXin (KEFLEX) 500 MG capsule Take 1 capsule (500 mg total) by mouth 3 (three) times daily. Patient not taking: Reported on 03/13/2017 01/25/17   Azalia Bilis, MD  cetirizine (ZYRTEC) 10 MG tablet Take 10 mg by mouth daily.    [provider]  clonazePAM (KLONOPIN) 0.5 MG tablet Take 0.5 mg by mouth 2 (two) times daily.     [provider]  divalproex (DEPAKOTE) 500 MG DR tablet Take 500 mg by mouth 2 (two) times daily.     [provider]  escitalopram (LEXAPRO) 20 MG tablet Take 30 mg by mouth daily.     [provider]  fenofibrate 160 MG tablet Take 160 mg by  mouth daily.    [provider]  Fluticasone-Salmeterol (ADVAIR) 100-50 MCG/DOSE AEPB Inhale 1 puff into the lungs 2 (two) times daily.     [provider]  furosemide (LASIX) 20 MG tablet Take 20 mg by mouth daily.    [provider]  HYDROcodone-acetaminophen (NORCO/VICODIN) 5-325 MG tablet Take 1 tablet by mouth every 4 (four) hours as needed. Patient not taking: Reported on 05/02/2017 03/13/17   Linwood Dibbles, MD  hydrocortisone 2.5 % cream Apply 1 application topically 2 (two) times daily.    [provider]  loperamide (IMODIUM) 2 MG capsule Take 1 capsule (2 mg total) by mouth 4 (four) times daily as needed for diarrhea or loose stools. 04/21/17   Burgess Amor, PA-C  Omega-3 Fatty Acids (FISH OIL PO) Take 1 capsule by mouth daily.    [provider]  pantoprazole (PROTONIX) 40 MG tablet Take 40 mg by mouth daily.     [provider]  potassium chloride (K-DUR) 10 MEQ tablet Take 10 mEq by mouth daily.    [provider]  sucralfate (CARAFATE) 1 g tablet Take 1 g by mouth 4 (four) times daily -  with meals and at bedtime.    [provider]     Allergies Penicillins  Family History  Problem Relation Age of Onset  . Heart attack Father   . Diabetes Mother     Social History Social History   Tobacco Use  . Smoking status: Never Smoker  . Smokeless tobacco: Never Used  Substance Use Topics  . Alcohol use: No  . Drug use: No    Review of Systems  Constitutional: No dizziness Eyes: No visual changes.  ENT: No neck pain Cardiovascular: Denies chest pain. Respiratory: Denies shortness of breath. Gastrointestinal: No abdominal pain.   Genitourinary: Negative for dysuria. Musculoskeletal: Negative for back pain. Skin: Negative for rash. Neurological: Negative for headaches   ____________________________________________   PHYSICAL EXAM:  VITAL SIGNS: ED Triage Vitals [09/08/17 1947]  Enc Vitals Group     BP 136/65     Pulse Rate 87     Resp 20     Temp 97.8 F (36.6 C)     Temp Source Oral     SpO2 96 %     Weight 85.3 kg (188 lb)     Height 1.6 m ( )     Head Circumference      Peak Flow      Pain Score 0     Pain Loc      Pain Edu?      Excl. in GC?     Constitutional: Alert. No acute distress. Eyes: Conjunctivae are normal.   Nose: No congestion/rhinnorhea. Mouth/Throat: Mucous membranes are moist.    Cardiovascular: Normal rate, regular rhythm. Grossly normal heart sounds.  Good peripheral  circulation. Respiratory: Normal respiratory effort.  No retractions. Gastrointestinal: Soft and nontender. No distention.   Musculoskeletal:  Warm and well perfused Neurologic:  Normal speech and language. No gross focal neurologic deficits are appreciated.  Skin:  Skin is warm, dry and intact. No rash noted. Psychiatric: No evidence of severe depression, behavior is calm  ____________________________________________   LABS (all labs ordered are listed, but only abnormal results are displayed)  Labs Reviewed  COMPREHENSIVE METABOLIC PANEL - Abnormal; Notable for the following components:      Result Value   Potassium 3.2 (*)    Glucose, Bld 100 (*)    ALT 13 (*)  All other components within normal limits  ACETAMINOPHEN LEVEL - Abnormal; Notable for the following components:   Acetaminophen (Tylenol), Serum <10 (*)    All other components within normal limits  ETHANOL  SALICYLATE LEVEL  CBC  URINE DRUG SCREEN, QUALITATIVE ([facility] ONLY)  POC URINE PREG, ED   ____________________________________________  EKG  None ____________________________________________  RADIOLOGY  None ____________________________________________   PROCEDURES  Procedure(s) performed: No  Procedures   Critical Care performed: No ____________________________________________   INITIAL IMPRESSION / ASSESSMENT AND PLAN / ED COURSE  Pertinent labs & imaging results that were available during my care of the patient were reviewed by me and considered in my medical decision making (see chart for details).  Patient appears to be at her baseline, has a history of coming to the emergency department for vague suicidal ideation. Patient lives in a group home, no access to pills. Exam is unremarkable.  After discussions she agrees that her boyfriend should be able to go on a walk without her becoming suicidal, no indication for IVC or admission to the hospital at this time.  Will discharge with  outpatient follow-up.    ____________________________________________   FINAL CLINICAL IMPRESSION(S) / ED DIAGNOSES  Final diagnoses:  Depression, unspecified depression type        Note:  This document was prepared using Dragon voice recognition software and may include unintentional dictation errors.    Jene Every, MD 09/08/17 2155

## 2017-09-08 NOTE — ED Notes (Signed)
Patient here voluntarily via [employer]

## 2017-09-08 NOTE — ED Triage Notes (Addendum)
Patient to the ED with [facility] voluntary.  Patient reports she has been having suicidal thoughts that she was thinking about taking pills.  Patient contracts for safety with this RN.

## 2017-09-10 ENCOUNTER — Emergency Department: Payer: Medicare Other

## 2017-09-10 ENCOUNTER — Other Ambulatory Visit: Payer: Self-pay

## 2017-09-10 ENCOUNTER — Emergency Department
Admission: EM | Admit: 2017-09-10 | Discharge: 2017-09-10 | Disposition: A | Discharge status: Home / Self Care | Payer: Medicare Other | Attending: Emergency Medicine | Admitting: Emergency Medicine

## 2017-09-10 ENCOUNTER — Encounter: Payer: Self-pay | Admitting: Emergency Medicine

## 2017-09-10 DIAGNOSIS — F70 Mild intellectual disabilities: Secondary | ICD-10-CM | POA: Insufficient documentation

## 2017-09-10 DIAGNOSIS — Z7982 Long term (current) use of aspirin: Secondary | ICD-10-CM | POA: Diagnosis not present

## 2017-09-10 DIAGNOSIS — Z79899 Other long term (current) drug therapy: Secondary | ICD-10-CM | POA: Insufficient documentation

## 2017-09-10 DIAGNOSIS — R079 Chest pain, unspecified: Secondary | ICD-10-CM | POA: Insufficient documentation

## 2017-09-10 DIAGNOSIS — Z046 Encounter for general psychiatric examination, requested by authority: Secondary | ICD-10-CM | POA: Insufficient documentation

## 2017-09-10 DIAGNOSIS — N390 Urinary tract infection, site not specified: Secondary | ICD-10-CM | POA: Insufficient documentation

## 2017-09-10 DIAGNOSIS — J45909 Unspecified asthma, uncomplicated: Secondary | ICD-10-CM | POA: Insufficient documentation

## 2017-09-10 DIAGNOSIS — F603 Borderline personality disorder: Secondary | ICD-10-CM | POA: Diagnosis not present

## 2017-09-10 DIAGNOSIS — F419 Anxiety disorder, unspecified: Secondary | ICD-10-CM | POA: Diagnosis not present

## 2017-09-10 DIAGNOSIS — F250 Schizoaffective disorder, bipolar type: Secondary | ICD-10-CM | POA: Diagnosis present

## 2017-09-10 DIAGNOSIS — Z9049 Acquired absence of other specified parts of digestive tract: Secondary | ICD-10-CM | POA: Insufficient documentation

## 2017-09-10 DIAGNOSIS — F329 Major depressive disorder, single episode, unspecified: Secondary | ICD-10-CM | POA: Diagnosis not present

## 2017-09-10 DIAGNOSIS — Z008 Encounter for other general examination: Secondary | ICD-10-CM

## 2017-09-10 LAB — CBC
HCT: 36.9 % (ref 35.0–47.0)
Hemoglobin: 12.5 g/dL (ref 12.0–16.0)
MCH: 33 pg (ref 26.0–34.0)
MCHC: 34 g/dL (ref 32.0–36.0)
MCV: 97 fL (ref 80.0–100.0)
Platelets: 255 10*3/uL (ref 150–440)
RBC: 3.8 MIL/uL (ref 3.80–5.20)
RDW: 12.6 % (ref 11.5–14.5)
WBC: 6.8 10*3/uL (ref 3.6–11.0)

## 2017-09-10 LAB — COMPREHENSIVE METABOLIC PANEL
ALBUMIN: 3.7 g/dL (ref 3.5–5.0)
ALT: 14 U/L (ref 14–54)
ANION GAP: 10 (ref 5–15)
AST: 20 U/L (ref 15–41)
Alkaline Phosphatase: 67 U/L (ref 38–126)
BUN: 19 mg/dL (ref 6–20)
CHLORIDE: 105 mmol/L (ref 101–111)
CO2: 27 mmol/L (ref 22–32)
Calcium: 9.5 mg/dL (ref 8.9–10.3)
Creatinine, Ser: 0.72 mg/dL (ref 0.44–1.00)
GFR calc Af Amer: >60 mL/min (ref >60)
GFR calc non Af Amer: >60 mL/min (ref >60)
GLUCOSE: 117 mg/dL — AB (ref 65–99)
POTASSIUM: 3.4 mmol/L — AB (ref 3.5–5.1)
SODIUM: 142 mmol/L (ref 135–145)
TOTAL PROTEIN: 7.1 g/dL (ref 6.5–8.1)
Total Bilirubin: 0.4 mg/dL (ref 0.3–1.2)

## 2017-09-10 LAB — URINALYSIS, ROUTINE W REFLEX MICROSCOPIC
Bilirubin Urine: NEGATIVE
Glucose, UA: NEGATIVE mg/dL
HGB URINE DIPSTICK: NEGATIVE
Ketones, ur: NEGATIVE mg/dL
NITRITE: POSITIVE — AB
PH: 5 (ref 5.0–8.0)
Protein, ur: 100 mg/dL — AB
SPECIFIC GRAVITY, URINE: 1.023 (ref 1.005–1.030)

## 2017-09-10 LAB — TROPONIN I: Troponin I: <0.03 ng/mL (ref <0.03)

## 2017-09-10 LAB — LIPASE, BLOOD: Lipase: 44 U/L (ref 11–51)

## 2017-09-10 MED ORDER — FOSFOMYCIN TROMETHAMINE 3 G PO PACK
3.0000 g | PACK | Freq: Once | ORAL | Status: AC
  Administered 2017-09-10: 3 g via ORAL
  Filled 2017-09-10: qty 3

## 2017-09-10 MED ORDER — ONDANSETRON 4 MG PO TBDP
ORAL_TABLET | ORAL | Status: AC
  Filled 2017-09-10: qty 1

## 2017-09-10 MED ORDER — ONDANSETRON 4 MG PO TBDP
4.0000 mg | ORAL_TABLET | Freq: Once | ORAL | Status: AC
  Administered 2017-09-10: 4 mg via ORAL

## 2017-09-10 NOTE — ED Notes (Signed)
Spoke with group home staff, Antonio who states he will be to ED shortly to pick up patient.

## 2017-09-10 NOTE — Discharge Instructions (Addendum)
You have been seen in the emergency department.  Your work-up today has shown largely normal results besides a urinary tract infection which has been treated with a one-time dose of antibiotics, no further antibiotics are required.  Please follow-up with your doctor within the next several days for recheck/reevaluation.

## 2017-09-10 NOTE — ED Triage Notes (Signed)
Pt presents to ED via [facility] from [facility]. EMS report they were initially called out for chest pain, however upon their arrival to family care home pt stated she wants to leave group home and will "hurt somebody and myself if I have to go back." When asked about chest pain pt verbalizes c/o chest, back, and abdominal pain. Pt states, "If I can stay for 3 days I know I won't hurt myself. When I am there I have suicidal ideation and homicidal ideation." Pt denies any specific plan or target at this time. EDP Paduchowski at bedside. Pt also states she is her own guardian and has already put in a 14 day notice to vacate with family care home. States, "I want to move to [facility] so I can be with my boyfriend."

## 2017-09-10 NOTE — ED Provider Notes (Signed)
[facility] Emergency Department Provider Note  Time seen: 6:46 PM  I have reviewed the triage vital signs and the nursing notes.   HISTORY  Chief Complaint Chest Pain    HPI [name] is a 52 y.o. female with a past medical history of anxiety, asthma, COPD, gastric reflux, schizoaffective disorder, cognitive impairment, presents to the emergency department for chest pain as well as homicidal and suicidal ideation.  According to EMS the call to the group home was for chest pain.  However when the patient was in the ambulance she then said she just does not want to be at the group home and said she was having thoughts of hurting herself or hurting other people.  Patient is mentally impaired, when I discussed this with the patient she says she thinks her chest was hurting her but she did not want to be at the group home anymore so she called EMS.  She said cannot you just keep me for a few days in the hospital and then I will go back to my group home.  She said she is having thoughts of hurting herself and other people.  And asked if she can go ahead and dress out into purple scrubs so she can spend the night here.  Patient is largely negative review of systems besides says she thinks it hurts when she urinates.  Cannot quantify duration.   Past Medical History:  Diagnosis Date  . Anxiety   . Asthma   . Borderline personality disorder (HCC)   . Cerebral palsy (HCC)   . COPD (chronic obstructive pulmonary disease) (HCC)   . Depression   . GERD (gastroesophageal reflux disease)   . Mild cognitive impairment   . Schizoaffective disorder (HCC)   . Wound abscess     Patient Active Problem List   Diagnosis Date Noted  . Severe recurrent major depression without psychotic features (HCC) 08/10/2016  . Overdose by acetaminophen 08/08/2016  . Schizoaffective disorder, bipolar type (HCC)   . 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 GERD (gastroesophageal reflux  disease) 09/24/2014  . COPD (chronic obstructive pulmonary disease) (HCC) 09/24/2014  . Borderline personality disorder (HCC) 09/24/2014  . Cerebral palsy (HCC) 08/31/2014    Past Surgical History:  Procedure Laterality Date  . CHOLECYSTECTOMY    . INCISION AND DRAINAGE ABSCESS Right 01/02/2015   Procedure: INCISION AND DRAINAGE ABSCESS;  Surgeon: Tiney Rouge III, MD;  Location: [facility] ORS;  Service: General;  Laterality: Right;  . TONSILLECTOMY      Prior to Admission medications   Medication Sig Start Date End Date Taking? Authorizing Provider  amantadine (SYMMETREL) 100 MG capsule Take 1 capsule (100 mg total) by mouth 2 (two) times daily. 08/14/16   Jimmy Footman, MD  ARIPiprazole (ABILIFY) 5 MG tablet Take 5 mg by mouth daily.    [provider]  aspirin EC 81 MG tablet Take 81 mg by mouth daily.    [provider]  azithromycin (ZITHROMAX Z-PAK) 250 MG tablet Take 2 tablets (500 mg) on  Day 1,  followed by 1 tablet (250 mg) once daily on Days 2 through 5. 05/23/17   Enid Derry, PA-C  benzonatate (TESSALON PERLES) 100 MG capsule Take 1 capsule (100 mg total) by mouth 3 (three) times daily as needed for cough. 05/23/17 05/23/18  Enid Derry, PA-C  cephALEXin (KEFLEX) 500 MG capsule Take 1 capsule (500 mg total) by mouth 3 (three) times daily. Patient not taking: Reported on 03/13/2017 01/25/17  Azalia Bilis, MD  cetirizine (ZYRTEC) 10 MG tablet Take 10 mg by mouth daily.    [provider]  clonazePAM (KLONOPIN) 0.5 MG tablet Take 0.5 mg by mouth 2 (two) times daily.     [provider]  divalproex (DEPAKOTE) 500 MG DR tablet Take 500 mg by mouth 2 (two) times daily.     [provider]  escitalopram (LEXAPRO) 20 MG tablet Take 30 mg by mouth daily.     [provider]  fenofibrate 160 MG tablet Take 160 mg by mouth daily.    [provider]  Fluticasone-Salmeterol (ADVAIR) 100-50 MCG/DOSE AEPB Inhale 1 puff into  the lungs 2 (two) times daily.     [provider]  furosemide (LASIX) 20 MG tablet Take 20 mg by mouth daily.    [provider]  HYDROcodone-acetaminophen (NORCO/VICODIN) 5-325 MG tablet Take 1 tablet by mouth every 4 (four) hours as needed. Patient not taking: Reported on 05/02/2017 03/13/17   Linwood Dibbles, MD  hydrocortisone 2.5 % cream Apply 1 application topically 2 (two) times daily.    [provider]  loperamide (IMODIUM) 2 MG capsule Take 1 capsule (2 mg total) by mouth 4 (four) times daily as needed for diarrhea or loose stools. 04/21/17   Burgess Amor, PA-C  Omega-3 Fatty Acids (FISH OIL PO) Take 1 capsule by mouth daily.    [provider]  pantoprazole (PROTONIX) 40 MG tablet Take 40 mg by mouth daily.     [provider]  potassium chloride (K-DUR) 10 MEQ tablet Take 10 mEq by mouth daily.    [provider]  sucralfate (CARAFATE) 1 g tablet Take 1 g by mouth 4 (four) times daily -  with meals and at bedtime.    [provider]    Allergies  Allergen Reactions  . Penicillins Nausea And Vomiting and Other (See Comments)    Reaction:  No  Has patient had a PCN reaction causing immediate rash, facial/tongue/throat swelling, SOB or lightheadedness with hypotension: No Has patient had a PCN reaction causing severe rash involving mucus membranes or skin necrosis: No Has patient had a PCN reaction that required hospitalization No Has patient had a PCN reaction occurring within the last 10 years: No If all of the above answers are "NO", then may proceed with Cephalosporin use.     Family History  Problem Relation Age of Onset  . Heart attack Father   . Diabetes Mother     Social History Social History   Tobacco Use  . Smoking status: Never Smoker  . Smokeless tobacco: Never Used  Substance Use Topics  . Alcohol use: No  . Drug use: No    Review of Systems Constitutional: Negative for fever. Eyes: Negative for  visual complaints ENT: Negative for recent illness/congestion Cardiovascular: States chest pain earlier, gone now Respiratory: Negative for shortness of breath. Gastrointestinal: Negative for abdominal pain, vomiting  Genitourinary: Positive for dysuria Musculoskeletal: Negative for musculoskeletal complaints Skin: Negative for skin complaints  Neurological: Negative for headache All other ROS negative  ____________________________________________   PHYSICAL EXAM:  Constitutional: Alert, no distress, calm and cooperative. Eyes: Normal exam ENT   Head: Normocephalic and atraumatic.   Mouth/Throat: Mucous membranes are moist. Cardiovascular: Normal rate, regular rhythm.  Respiratory: Normal respiratory effort without tachypnea nor retractions. Breath sounds are clear Gastrointestinal: Soft and nontender. No distention.  Musculoskeletal: Nontender with normal range of motion in all extremities.  Neurologic:  Normal speech and language. No  gross focal neurologic deficits  Skin:  Skin is warm, dry and intact.  Psychiatric: Mood and affect are normal.   ____________________________________________    EKG  EKG reviewed and interpreted by myself shows normal sinus rhythm at 73 bpm with a narrow QRS, normal axis, largely normal intervals with nonspecific but no concerning ST changes.  ____________________________________________    RADIOLOGY  chest x-ray likely reflects atelectasis  ____________________________________________   INITIAL IMPRESSION / ASSESSMENT AND PLAN / ED COURSE  Pertinent labs & imaging results that were available during my care of the patient were reviewed by me and considered in my medical decision making (see chart for details).  Patient presents to the emergency department with a complaint of chest pain.  However upon arrival to the emergency department patient states she does not want to go back to her group home right now and is asking if she  can just spend the night for a few nights in the hospital and then she will go back to her group home.  When asked why she cannot really say.  She be asked if she can change out into scrubs and just spend the night for a few nights.  When asked why she says she was having thoughts of hurting herself or other people.  Patient is cognitively impaired, I do not believe that she is a risk to herself or anybody else, I believe at this time it is more likely that the patient does not want to return to her group home.  However given the patient's past psychiatric history as well as mental impairment I will consult psychiatry for further evaluation.  I will also evaluate the patient medically for her chest pain including labs, cardiac markers, EKG and chest x-ray.  Labs show urinary tract infection.  Patient does fosfomycin in the emergency department, urine culture has been added on to the patient's urinalysis.  We will discharge the patient with PCP follow-up.  ____________________________________________   FINAL CLINICAL IMPRESSION(S) / ED DIAGNOSES  Chest pain Psychiatric evaluation Urinary tract infection   Minna Antis, MD 09/10/17 2014

## 2017-09-10 NOTE — Consult Note (Signed)
[facility] Consult   Reason for Consult: Consult for 52 year old woman with a history of behavior disorder who came into the hospital for chest pain and then started talking about suicide Referring Physician: [facility] [name] MRN:  [id number] Principal Diagnosis: Borderline personality disorder [facility]) Diagnosis:   Patient Active Problem List   Diagnosis Date Noted  . Severe recurrent major depression without psychotic features ([place]) [F33.2] 08/10/2016  . Overdose by acetaminophen [T39.1X1A] 08/08/2016  . Schizoaffective disorder, bipolar type ([place]) [F25.0]   . 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 GERD (gastroesophageal reflux disease) [K21.9] 09/24/2014  . COPD (chronic obstructive pulmonary disease) ([place]) [J44.9] 09/24/2014  . Borderline personality disorder ([place]) [F60.3] 09/24/2014  . Cerebral palsy ([place]) [G80.9] 08/31/2014    Total Time spent with patient: 1 hour  Subjective:   [name] is a 52 y.o. female patient admitted with "I am suicidal and homicidal".  HPI: Patient seen chart reviewed.  Patient well known from many previous encounters.  52 year old woman with developmental disability personality disorder possible schizoaffective disorder.  Comes into the emergency room today initially complaining of chest pain but then after talking to the emergency room doctor starts talking about how she is suicidal and homicidal.  Specifically she says she is suicidal and homicidal because she does not want to go back to the group home where she has been living.  When I came to see her and ask her what is wrong she tells me right off that she is "suicidal and homicidal".  This is her usual way of phrasing her distress.  Patient does not say she is sad and depressed.  She does indicate that she is unhappy with her group home because they do not let her use the telephone enough.  Patient tells me that if we send her back to the  group home she will just walk away from it and call 911 again.  She tells me that she is only been at that group home for 8 days and already is dissatisfied about the phone policy.  Patient is not reporting any delusions.  Not reporting any hallucinations.  Still taking her medicine as usual.  Affect and behavior are exactly the same as how we have seen it in the past.  Medical history: Overweight.  History of asthma  Social history: Patient says she is her own guardian.  I think that is probably true.  Lives in a group home.  She says that she had been in [facility] for a little while which is why we have not seen her lately but just recently got moved back to [facility].  She tells me her goal is to move to [facility] where her boyfriend lives.  Substance abuse history: No history of significant substance abuse problems  Past Psychiatric History: Patient carries a diagnosis of schizoaffective disorder but I have never seen her truly behaving or acting psychotic.  Her usual behavior is manipulative and simpleminded.  Gets frustrated easily.  Makes threats specifically to get placed in new environments.  She may have had some self injury in the past but no serious suicide attempts no history of violence.  Multiple visits to the emergency room in which she is said exactly the same thing she is saying today with no bad outcome.  Risk to Self: Is patient at risk for suicide?: No, but patient needs Medical Clearance Risk to Others:   Prior Inpatient Therapy:   Prior Outpatient Therapy:  Past Medical History:  Past Medical History:  Diagnosis Date  . Anxiety   . Asthma   . Borderline personality disorder ([place])   . Cerebral palsy ([place])   . COPD (chronic obstructive pulmonary disease) ([place])   . Depression   . GERD (gastroesophageal reflux disease)   . Mild cognitive impairment   . Schizoaffective disorder ([place])   . Wound abscess     Past Surgical History:  Procedure Laterality  Date  . CHOLECYSTECTOMY    . INCISION AND DRAINAGE ABSCESS Right 01/02/2015   Procedure: INCISION AND DRAINAGE ABSCESS;  Surgeon: Dia Crawford III, MD;  Location: [facility] ORS;  Service: General;  Laterality: Right;  . TONSILLECTOMY     Family History:  Family History  Problem Relation Age of Onset  . Heart attack Father   . Diabetes Mother    Family Psychiatric  History: Unknown Social History:  Social History   Substance and Sexual Activity  Alcohol Use No     Social History   Substance and Sexual Activity  Drug Use No    Social History   Socioeconomic History  . Marital status: Single    Spouse name: Not on file  . Number of children: Not on file  . Years of education: Not on file  . Highest education level: Not on file  Occupational History  . Not on file  Social Needs  . Financial resource strain: Not on file  . Food insecurity:    Worry: Not on file    Inability: Not on file  . Transportation needs:    Medical: Not on file    Non-medical: Not on file  Tobacco Use  . Smoking status: Never Smoker  . Smokeless tobacco: Never Used  Substance and Sexual Activity  . Alcohol use: No  . Drug use: No  . Sexual activity: Not Currently    Birth control/protection: Implant  Lifestyle  . Physical activity:    Days per week: Not on file    Minutes per session: Not on file  . Stress: Not on file  Relationships  . Social connections:    Talks on phone: Not on file    Gets together: Not on file    Attends religious service: Not on file    Active member of club or organization: Not on file    Attends meetings of clubs or organizations: Not on file    Relationship status: Not on file  Other Topics Concern  . Not on file  Social History Narrative  . Not on file   Additional Social History:    Allergies:   Allergies  Allergen Reactions  . Penicillins Nausea And Vomiting and Other (See Comments)    Reaction:  No  Has patient had a PCN reaction causing immediate  rash, facial/tongue/throat swelling, SOB or lightheadedness with hypotension: No Has patient had a PCN reaction causing severe rash involving mucus membranes or skin necrosis: No Has patient had a PCN reaction that required hospitalization No Has patient had a PCN reaction occurring within the last 10 years: No If all of the above answers are "NO", then may proceed with Cephalosporin use.     Labs:  Results for orders placed or performed during the hospital encounter of 09/08/17 (from the past 48 hour(s))  Comprehensive metabolic panel     Status: Abnormal   Collection Time: 09/08/17  8:04 PM  Result Value Ref Range   Sodium 140 135 - 145 mmol/L   Potassium 3.2 (L) 3.5 -  5.1 mmol/L   Chloride 104 101 - 111 mmol/L   CO2 29 22 - 32 mmol/L   Glucose, Bld 100 (H) 65 - 99 mg/dL   BUN 17 6 - 20 mg/dL   Creatinine, Ser 0.72 0.44 - 1.00 mg/dL   Calcium 9.4 8.9 - 10.3 mg/dL   Total Protein 7.4 6.5 - 8.1 g/dL   Albumin 3.9 3.5 - 5.0 g/dL   AST 20 15 - 41 U/L   ALT 13 (L) 14 - 54 U/L   Alkaline Phosphatase 65 38 - 126 U/L   Total Bilirubin 0.4 0.3 - 1.2 mg/dL   GFR calc non Af Amer >60 >60 mL/min   GFR calc Af Amer >60 >60 mL/min    Comment: (NOTE) The eGFR has been calculated using the CKD EPI equation. This calculation has not been validated in all clinical situations. eGFR's persistently <60 mL/min signify possible Chronic Kidney Disease.    Anion gap 7 5 - 15    Comment: Performed at [facility], [address] [place], [place] [address]  Ethanol     Status: None   Collection Time: 09/08/17  8:04 PM  Result Value Ref Range   Alcohol, Ethyl (B) <10 <10 mg/dL    Comment: (NOTE) Lowest detectable limit for serum alcohol is 10 mg/dL. For medical purposes only. Performed at [facility], [address] [place], [place] [address]   Salicylate level     Status: None   Collection Time: 09/08/17  8:04 PM  Result Value Ref Range   Salicylate Lvl <9.8 2.8 - 30.0  mg/dL    Comment: Performed at [facility], [address] [place], [place] [address]  Acetaminophen level     Status: Abnormal   Collection Time: 09/08/17  8:04 PM  Result Value Ref Range   Acetaminophen (Tylenol), Serum <10 (L) 10 - 30 ug/mL    Comment: (NOTE) Therapeutic concentrations vary significantly. A range of 10-30 ug/mL  may be an effective concentration for many patients. However, some  are best treated at concentrations outside of this range. Acetaminophen concentrations >150 ug/mL at 4 hours after ingestion  and >50 ug/mL at 12 hours after ingestion are often associated with  toxic reactions. Performed at [facility], [address] [place], [place] [address]   cbc     Status: None   Collection Time: 09/08/17  8:04 PM  Result Value Ref Range   WBC 6.0 3.6 - 11.0 K/uL   RBC 3.99 3.80 - 5.20 MIL/uL   Hemoglobin 13.3 12.0 - 16.0 g/dL   HCT 38.5 35.0 - 47.0 %   MCV 96.5 80.0 - 100.0 fL   MCH 33.3 26.0 - 34.0 pg   MCHC 34.5 32.0 - 36.0 g/dL   RDW 13.0 11.5 - 14.5 %   Platelets 260 150 - 440 K/uL    Comment: Performed at [facility], [address] [place], [place] [address]    No current facility-administered medications for this encounter.    Current Outpatient Medications  Medication Sig Dispense Refill  . amantadine (SYMMETREL) 100 MG capsule Take 1 capsule (100 mg total) by mouth 2 (two) times daily. 60 capsule 0  . ARIPiprazole (ABILIFY) 5 MG tablet Take 5 mg by mouth daily.    Marland Kitchen aspirin EC 81 MG tablet Take 81 mg by mouth daily.    Marland Kitchen azithromycin (ZITHROMAX Z-PAK) 250 MG tablet Take 2 tablets (500 mg) on  Day 1,  followed by 1  tablet (250 mg) once daily on Days 2 through 5. 6 each 0  . benzonatate (TESSALON PERLES) 100 MG capsule Take 1 capsule (100 mg total) by mouth 3 (three) times daily as needed for cough. 30 capsule 0  . cephALEXin (KEFLEX) 500 MG capsule Take 1 capsule (500 mg total) by mouth 3 (three) times daily.  (Patient not taking: Reported on 03/13/2017) 15 capsule 0  . cetirizine (ZYRTEC) 10 MG tablet Take 10 mg by mouth daily.    . clonazePAM (KLONOPIN) 0.5 MG tablet Take 0.5 mg by mouth 2 (two) times daily.     . divalproex (DEPAKOTE) 500 MG DR tablet Take 500 mg by mouth 2 (two) times daily.     Marland Kitchen escitalopram (LEXAPRO) 20 MG tablet Take 30 mg by mouth daily.     . fenofibrate 160 MG tablet Take 160 mg by mouth daily.    . Fluticasone-Salmeterol (ADVAIR) 100-50 MCG/DOSE AEPB Inhale 1 puff into the lungs 2 (two) times daily.     . furosemide (LASIX) 20 MG tablet Take 20 mg by mouth daily.    Marland Kitchen HYDROcodone-acetaminophen (NORCO/VICODIN) 5-325 MG tablet Take 1 tablet by mouth every 4 (four) hours as needed. (Patient not taking: Reported on 05/02/2017) 12 tablet 0  . hydrocortisone 2.5 % cream Apply 1 application topically 2 (two) times daily.    Marland Kitchen loperamide (IMODIUM) 2 MG capsule Take 1 capsule (2 mg total) by mouth 4 (four) times daily as needed for diarrhea or loose stools. 12 capsule 0  . Omega-3 Fatty Acids (FISH OIL PO) Take 1 capsule by mouth daily.    . pantoprazole (PROTONIX) 40 MG tablet Take 40 mg by mouth daily.     . potassium chloride (K-DUR) 10 MEQ tablet Take 10 mEq by mouth daily.    . sucralfate (CARAFATE) 1 g tablet Take 1 g by mouth 4 (four) times daily -  with meals and at bedtime.      Musculoskeletal: Strength & Muscle Tone: within normal limits Gait & Station: normal Patient leans: N/A  Psychiatric Specialty Exam: Physical Exam  Nursing note and vitals reviewed. Constitutional: She appears well-developed and well-nourished.  HENT:  Head: Normocephalic and atraumatic.  Eyes: Pupils are equal, round, and reactive to light. Conjunctivae are normal.  Neck: Normal range of motion.  Cardiovascular: Normal heart sounds.  Respiratory: Effort normal.  GI: Soft.  Musculoskeletal: Normal range of motion.  Neurological: She is alert.  Skin: Skin is warm and dry.  Psychiatric:  Her speech is normal and behavior is normal. Thought content normal. Her mood appears anxious. Cognition and memory are impaired. She expresses impulsivity.    Review of Systems  Constitutional: Negative.   HENT: Negative.   Eyes: Negative.   Respiratory: Negative.   Cardiovascular: Positive for chest pain.  Gastrointestinal: Negative.   Musculoskeletal: Negative.   Skin: Negative.   Neurological: Negative.   Psychiatric/Behavioral: Positive for suicidal ideas. Negative for depression, hallucinations, memory loss and substance abuse. The patient is nervous/anxious. The patient does not have insomnia.     Blood pressure 122/76, pulse 75, temperature 98.3 F (36.8 C), resp. rate (!) 21, height _0  (1.6 m), weight 85.3 kg (188 lb), SpO2 95 %.Body mass index is 33.3 kg/m.  General Appearance: Fairly Groomed  Eye Contact:  Good  Speech:  Clear and Coherent  Volume:  Normal  Mood:  Anxious and Dysphoric  Affect:  Congruent  Thought Process:  Goal Directed  Orientation:  Full (Time, Place, and Person)  Thought Content:  Tangential  Suicidal Thoughts:  Yes.  without intent/plan  Homicidal Thoughts:  Yes.  without intent/plan  Memory:  Immediate;   Fair Recent;   Fair Remote;   Fair  Judgement:  Poor  Insight:  Shallow  Psychomotor Activity:  Normal  Concentration:  Concentration: Fair  Recall:  [employer] of Knowledge:  Fair  Language:  Fair  Akathisia:  No  Handed:  Right  AIMS (if indicated):     Assets:  Housing Resilience  ADL's:  Intact  Cognition:  Impaired,  Mild and Moderate  Sleep:        Treatment Plan Summary: Plan 52 year old woman with developmental disability and personality disorder with very poor coping skills.  This is her standard behavior to come to the emergency room and to specifically say "I am suicidal and homicidal".  There is no evidence that she has a depression or psychotic or has any other treatable mental health problem.  She is quite explicit  that she is making these statements just because she does not like her group home and wants to be placed somewhere else.  Patient is not appropriate for admission to the psychiatric hospital and I do not think she represents an elevated risk to self based on her long history.  Case reviewed with emergency room physician.  No need for any change to psychiatric treatment.  Tried to do some counseling and supportive therapy with the patient.  No need for new prescription.  Disposition: No evidence of imminent risk to self or others at present.   Patient does not meet criteria for psychiatric inpatient admission. Supportive therapy provided about ongoing stressors.  Alethia Berthold, MD 09/10/2017 7:12 PM

## 2017-09-10 NOTE — ED Notes (Signed)
Dr. Toni Amend at bedside assessing patient. Per both [place] and EDP Paduchowski, pt does not require suicide sitter at this time.

## 2017-09-13 LAB — URINE CULTURE: Culture: >=100000 — AB

## 2017-11-01 ENCOUNTER — Emergency Department: Payer: Medicare Other

## 2017-11-01 ENCOUNTER — Other Ambulatory Visit: Payer: Self-pay

## 2017-11-01 ENCOUNTER — Emergency Department
Admission: EM | Admit: 2017-11-01 | Discharge: 2017-11-01 | Disposition: A | Discharge status: Home / Self Care | Payer: Medicare Other | Attending: Emergency Medicine | Admitting: Emergency Medicine

## 2017-11-01 ENCOUNTER — Encounter: Payer: Self-pay | Admitting: Emergency Medicine

## 2017-11-01 DIAGNOSIS — R45851 Suicidal ideations: Secondary | ICD-10-CM | POA: Insufficient documentation

## 2017-11-01 DIAGNOSIS — Z7982 Long term (current) use of aspirin: Secondary | ICD-10-CM | POA: Insufficient documentation

## 2017-11-01 DIAGNOSIS — F70 Mild intellectual disabilities: Secondary | ICD-10-CM | POA: Diagnosis present

## 2017-11-01 DIAGNOSIS — R079 Chest pain, unspecified: Secondary | ICD-10-CM

## 2017-11-01 DIAGNOSIS — J45909 Unspecified asthma, uncomplicated: Secondary | ICD-10-CM | POA: Insufficient documentation

## 2017-11-01 DIAGNOSIS — G809 Cerebral palsy, unspecified: Secondary | ICD-10-CM | POA: Diagnosis present

## 2017-11-01 DIAGNOSIS — F603 Borderline personality disorder: Secondary | ICD-10-CM | POA: Insufficient documentation

## 2017-11-01 DIAGNOSIS — F259 Schizoaffective disorder, unspecified: Secondary | ICD-10-CM | POA: Diagnosis not present

## 2017-11-01 DIAGNOSIS — R1084 Generalized abdominal pain: Secondary | ICD-10-CM | POA: Insufficient documentation

## 2017-11-01 DIAGNOSIS — N10 Acute tubulo-interstitial nephritis: Secondary | ICD-10-CM | POA: Diagnosis not present

## 2017-11-01 DIAGNOSIS — Z79899 Other long term (current) drug therapy: Secondary | ICD-10-CM | POA: Diagnosis not present

## 2017-11-01 DIAGNOSIS — N12 Tubulo-interstitial nephritis, not specified as acute or chronic: Secondary | ICD-10-CM

## 2017-11-01 DIAGNOSIS — F250 Schizoaffective disorder, bipolar type: Secondary | ICD-10-CM | POA: Diagnosis not present

## 2017-11-01 LAB — URINALYSIS, COMPLETE (UACMP) WITH MICROSCOPIC
BILIRUBIN URINE: NEGATIVE
Glucose, UA: NEGATIVE mg/dL
Hgb urine dipstick: NEGATIVE
Ketones, ur: NEGATIVE mg/dL
NITRITE: POSITIVE — AB
Protein, ur: 30 mg/dL — AB
SPECIFIC GRAVITY, URINE: 1.018 (ref 1.005–1.030)
WBC, UA: >50 WBC/hpf — ABNORMAL HIGH (ref 0–5)
pH: 5 (ref 5.0–8.0)

## 2017-11-01 LAB — CBC
HEMATOCRIT: 39.1 % (ref 35.0–47.0)
Hemoglobin: 13.2 g/dL (ref 12.0–16.0)
MCH: 31.8 pg (ref 26.0–34.0)
MCHC: 33.9 g/dL (ref 32.0–36.0)
MCV: 93.9 fL (ref 80.0–100.0)
Platelets: 184 10*3/uL (ref 150–440)
RBC: 4.16 MIL/uL (ref 3.80–5.20)
RDW: 13.3 % (ref 11.5–14.5)
WBC: 6 10*3/uL (ref 3.6–11.0)

## 2017-11-01 LAB — HEPATIC FUNCTION PANEL
ALT: 17 U/L (ref 0–44)
AST: 26 U/L (ref 15–41)
Albumin: 3.3 g/dL — ABNORMAL LOW (ref 3.5–5.0)
Alkaline Phosphatase: 73 U/L (ref 38–126)
Bilirubin, Direct: <0.1 mg/dL (ref 0.0–0.2)
TOTAL PROTEIN: 6.5 g/dL (ref 6.5–8.1)
Total Bilirubin: 0.4 mg/dL (ref 0.3–1.2)

## 2017-11-01 LAB — ETHANOL

## 2017-11-01 LAB — TROPONIN I

## 2017-11-01 LAB — BASIC METABOLIC PANEL
Anion gap: 7 (ref 5–15)
BUN: 15 mg/dL (ref 6–20)
CHLORIDE: 107 mmol/L (ref 98–111)
CO2: 28 mmol/L (ref 22–32)
Calcium: 8.5 mg/dL — ABNORMAL LOW (ref 8.9–10.3)
Creatinine, Ser: 0.66 mg/dL (ref 0.44–1.00)
GFR calc non Af Amer: >60 mL/min (ref >60)
Glucose, Bld: 99 mg/dL (ref 70–99)
Potassium: 3.5 mmol/L (ref 3.5–5.1)
SODIUM: 142 mmol/L (ref 135–145)

## 2017-11-01 LAB — URINE DRUG SCREEN, QUALITATIVE (ARMC ONLY)
Amphetamines, Ur Screen: NOT DETECTED
Benzodiazepine, Ur Scrn: NOT DETECTED
COCAINE METABOLITE, UR ~~LOC~~: NOT DETECTED
Cannabinoid 50 Ng, Ur ~~LOC~~: NOT DETECTED
MDMA (ECSTASY) UR SCREEN: NOT DETECTED
METHADONE SCREEN, URINE: NOT DETECTED
OPIATE, UR SCREEN: NOT DETECTED
Phencyclidine (PCP) Ur S: NOT DETECTED
Tricyclic, Ur Screen: NOT DETECTED

## 2017-11-01 LAB — SALICYLATE LEVEL: Salicylate Lvl: <7 mg/dL (ref 2.8–30.0)

## 2017-11-01 LAB — ACETAMINOPHEN LEVEL: Acetaminophen (Tylenol), Serum: <10 ug/mL — ABNORMAL LOW (ref 10–30)

## 2017-11-01 LAB — LIPASE, BLOOD: Lipase: 34 U/L (ref 11–51)

## 2017-11-01 MED ORDER — CEPHALEXIN 500 MG PO CAPS
500.0000 mg | ORAL_CAPSULE | Freq: Once | ORAL | Status: AC
  Administered 2017-11-01: 500 mg via ORAL
  Filled 2017-11-01: qty 1

## 2017-11-01 MED ORDER — CEPHALEXIN 500 MG PO CAPS: 500.0000 mg | ORAL_CAPSULE | Freq: Three times a day (TID) | ORAL | 0 refills | Status: AC

## 2017-11-01 NOTE — ED Notes (Signed)
Pt eating 2nd dinner tray

## 2017-11-01 NOTE — ED Triage Notes (Addendum)
PT arrives with complaints of chest pain that started 2 hours prior to arrival. Pt states the pain is mid sternum and feels like stabbing. In triage pt states "I feel a little suicidal as well." Pt denies a plan but states "I wish I was dead, I would be better off."

## 2017-11-01 NOTE — ED Notes (Signed)
[employer] able to contact Paticia StackShawnese Doan (sic?) with [facility] at [phone] who is coming to the ED to pick up pt and update contact information.

## 2017-11-01 NOTE — ED Notes (Signed)
Pt's belongings removed and placed in belonging bag. Belongings: watch, 2 rings, necklace, shorts, pink bra, white top, and white panties

## 2017-11-01 NOTE — ED Provider Notes (Signed)
[facility] Emergency Department Provider Note ____________________________________________   First MD Initiated Contact with Patient 11/01/17 1631     (approximate)  I have reviewed the triage vital signs and the nursing notes.   HISTORY  Chief Complaint Suicidal and Chest Pain  HPI [name] is a 52 y.o. female with a history of borderline personality disorder as well as schizoaffective disorder who is presenting to the emergency department today with suicidal ideation as well as chest pain and right flank pain.  Patient says that all 3 symptoms started today at approximately 12 noon.  She states that she is a plan to walk in front of a car.  She states that she has not had any inciting event to precipitate the suicidal ideation.  Does not report any toxic ingestion or self-harm prior to arrival today.  Says that she also has pain rating across the front of her chest which she describes as sharp, burning and pressure.  Says that has been constant since noon.  Also with right-sided flank pain that she thinks may be appendicitis.  However, she denies any nausea or vomiting and is currently eating a hamburger while we are taking this history.  Past Medical History:  Diagnosis Date  . Anxiety   . Asthma   . Borderline personality disorder (HCC)   . Cerebral palsy (HCC)   . COPD (chronic obstructive pulmonary disease) (HCC)   . Depression   . GERD (gastroesophageal reflux disease)   . Mild cognitive impairment   . Schizoaffective disorder (HCC)   . Wound abscess     Patient Active Problem List   Diagnosis Date Noted  . Severe recurrent major depression without psychotic features (HCC) 08/10/2016  . Overdose by acetaminophen 08/08/2016  . Schizoaffective disorder, bipolar type (HCC)   . 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 GERD (gastroesophageal reflux disease) 09/24/2014  . COPD (chronic obstructive pulmonary disease) (HCC) 09/24/2014  .  Borderline personality disorder (HCC) 09/24/2014  . Cerebral palsy (HCC) 08/31/2014    Past Surgical History:  Procedure Laterality Date  . CHOLECYSTECTOMY    . INCISION AND DRAINAGE ABSCESS Right 01/02/2015   Procedure: INCISION AND DRAINAGE ABSCESS;  Surgeon: Tiney Rouge III, MD;  Location: [facility] ORS;  Service: General;  Laterality: Right;  . TONSILLECTOMY      Prior to Admission medications   Medication Sig Start Date End Date Taking? Authorizing Provider  amantadine (SYMMETREL) 100 MG capsule Take 1 capsule (100 mg total) by mouth 2 (two) times daily. 08/14/16   Jimmy Footman, MD  ARIPiprazole (ABILIFY) 5 MG tablet Take 5 mg by mouth daily.    [provider]  aspirin EC 81 MG tablet Take 81 mg by mouth daily.    [provider]  azithromycin (ZITHROMAX Z-PAK) 250 MG tablet Take 2 tablets (500 mg) on  Day 1,  followed by 1 tablet (250 mg) once daily on Days 2 through 5. 05/23/17   Enid Derry, PA-C  benzonatate (TESSALON PERLES) 100 MG capsule Take 1 capsule (100 mg total) by mouth 3 (three) times daily as needed for cough. 05/23/17 05/23/18  Enid Derry, PA-C  cephALEXin (KEFLEX) 500 MG capsule Take 1 capsule (500 mg total) by mouth 3 (three) times daily. Patient not taking: Reported on 03/13/2017 01/25/17   Azalia Bilis, MD  cetirizine (ZYRTEC) 10 MG tablet Take 10 mg by mouth daily.    [provider]  clonazePAM (KLONOPIN) 0.5 MG tablet Take 0.5 mg by mouth 2 (two)  times daily.     [provider]  divalproex (DEPAKOTE) 500 MG DR tablet Take 500 mg by mouth 2 (two) times daily.     [provider]  escitalopram (LEXAPRO) 20 MG tablet Take 30 mg by mouth daily.     [provider]  fenofibrate 160 MG tablet Take 160 mg by mouth daily.    [provider]  Fluticasone-Salmeterol (ADVAIR) 100-50 MCG/DOSE AEPB Inhale 1 puff into the lungs 2 (two) times daily.     [provider]  furosemide (LASIX) 20 MG  tablet Take 20 mg by mouth daily.    [provider]  HYDROcodone-acetaminophen (NORCO/VICODIN) 5-325 MG tablet Take 1 tablet by mouth every 4 (four) hours as needed. Patient not taking: Reported on 05/02/2017 03/13/17   Linwood DibblesKnapp, Jon, MD  hydrocortisone 2.5 % cream Apply 1 application topically 2 (two) times daily.    [provider]  loperamide (IMODIUM) 2 MG capsule Take 1 capsule (2 mg total) by mouth 4 (four) times daily as needed for diarrhea or loose stools. 04/21/17   Burgess AmorIdol, Julie, PA-C  Omega-3 Fatty Acids (FISH OIL PO) Take 1 capsule by mouth daily.    [provider]  pantoprazole (PROTONIX) 40 MG tablet Take 40 mg by mouth daily.     [provider]  potassium chloride (K-DUR) 10 MEQ tablet Take 10 mEq by mouth daily.    [provider]  sucralfate (CARAFATE) 1 g tablet Take 1 g by mouth 4 (four) times daily -  with meals and at bedtime.    [provider]    Allergies Penicillins  Family History  Problem Relation Age of Onset  . Heart attack Father   . Diabetes Mother     Social History Social History   Tobacco Use  . Smoking status: Never Smoker  . Smokeless tobacco: Never Used  Substance Use Topics  . Alcohol use: No  . Drug use: No    Review of Systems  Constitutional: No fever/chills Eyes: No visual changes. ENT: No sore throat. Cardiovascular: As above Respiratory: Denies shortness of breath. Gastrointestinal: No nausea, no vomiting.  No diarrhea.  No constipation. Genitourinary: Negative for dysuria. Musculoskeletal: Negative for back pain. Skin: Negative for rash. Neurological: Negative for headaches, focal weakness or numbness.   ____________________________________________   PHYSICAL EXAM:  VITAL SIGNS: ED Triage Vitals  Enc Vitals Group     BP 11/01/17 1616 100/65     Pulse Rate 11/01/17 1616 70     Resp 11/01/17 1616 18     Temp 11/01/17 1616 99.3 F (37.4 C)     Temp Source 11/01/17 1616  Oral     SpO2 11/01/17 1616 95 %     Weight 11/01/17 1617 181 lb (82.1 kg)     Height 11/01/17 1617 5\' 3"  (1.6 m)     Head Circumference --      Peak Flow --      Pain Score 11/01/17 1617 8     Pain Loc --      Pain Edu? --      Excl. in GC? --     Constitutional: Alert and oriented. Well appearing and in no acute distress. Eyes: Conjunctivae are normal.  Head: Atraumatic. Nose: No congestion/rhinnorhea. Mouth/Throat: Mucous membranes are moist.  Neck: No stridor.   Cardiovascular: Normal rate, regular rhythm. Grossly normal heart sounds.   Respiratory: Normal respiratory effort.  No retractions. Lungs CTAB. Gastrointestinal: Soft and nontender. No distention. No  CVA tenderness. Musculoskeletal: No lower extremity tenderness nor edema.  No joint effusions. Neurologic:  Normal speech and language. No gross focal neurologic deficits are appreciated. Skin:  Skin is warm, dry and intact. No rash noted. Psychiatric: Mood and affect are normal. Speech and behavior are normal.  ____________________________________________   LABS (all labs ordered are listed, but only abnormal results are displayed)  Labs Reviewed  BASIC METABOLIC PANEL - Abnormal; Notable for the following components:      Result Value   Calcium 8.5 (*)    All other components within normal limits  HEPATIC FUNCTION PANEL - Abnormal; Notable for the following components:   Albumin 3.3 (*)    All other components within normal limits  URINALYSIS, COMPLETE (UACMP) WITH MICROSCOPIC - Abnormal; Notable for the following components:   Color, Urine YELLOW (*)    APPearance TURBID (*)    Protein, ur 30 (*)    Nitrite POSITIVE (*)    Leukocytes, UA MODERATE (*)    WBC, UA >50 (*)    Bacteria, UA MANY (*)    Non Squamous Epithelial 0-5 (*)    All other components within normal limits  URINE DRUG SCREEN, QUALITATIVE ([facility] ONLY) - Abnormal; Notable for the following components:   Barbiturates, Ur Screen   (*)     Value: Result not available. Reagent lot number recalled by manufacturer.   All other components within normal limits  ACETAMINOPHEN LEVEL - Abnormal; Notable for the following components:   Acetaminophen (Tylenol), Serum <10 (*)    All other components within normal limits  URINE CULTURE  CBC  TROPONIN I  LIPASE, BLOOD  SALICYLATE LEVEL  ETHANOL   ____________________________________________  EKG  ED ECG REPORT I, Arelia Longest, the attending physician, personally viewed and interpreted this ECG.   Date: 11/01/2017  EKG Time: 1615  Rate: 67  Rhythm: normal sinus rhythm  Axis: Normal  Intervals:none  ST&T Change: No ST segment elevation or depression.  No abnormal T wave inversion.  ____________________________________________  RADIOLOGY  Chest x-ray without acute process. ____________________________________________   PROCEDURES  Procedure(s) performed:   Procedures  Critical Care performed:   ____________________________________________   INITIAL IMPRESSION / ASSESSMENT AND PLAN / ED COURSE  Pertinent labs & imaging results that were available during my care of the patient were reviewed by me and considered in my medical decision making (see chart for details).  Differential diagnosis includes, but is not limited to, ACS, aortic dissection, pulmonary embolism, cardiac tamponade, pneumothorax, pneumonia, pericarditis, myocarditis, GI-related causes including esophagitis/gastritis, and musculoskeletal chest wall pain.   Differential diagnosis includes, but is not limited to, ovarian cyst, ovarian torsion, acute appendicitis, diverticulitis, urinary tract infection/pyelonephritis, endometriosis, bowel obstruction, colitis, renal colic, gastroenteritis, hernia, fibroids, endometriosis, pregnancy related pain including ectopic pregnancy, etc. As part of my medical decision making, I reviewed the following data within the electronic MEDICAL RECORD NUMBER Notes from  prior ED visits  ----------------------------------------- 7:23 PM on 11/01/2017 -----------------------------------------  Patient at this time has eaten a meal tray.  No vomiting.  No distress.  Evaluated by Dr. Toni Amend and deems appropriate for discharge back to group home.  Patient without suicidal or homicidal ideation at this time.  Found to have UTI.  Very reassuring work-up for chest pain which she has had multiple times in the past.  We will treat for pyelonephritis.  Patient to be discharged home. ____________________________________________   FINAL CLINICAL IMPRESSION(S) / ED DIAGNOSES  Pyelonephritis.  Suicidal ideation.  NEW MEDICATIONS STARTED  DURING THIS VISIT:  New Prescriptions   No medications on file     Note:  This document was prepared using Dragon voice recognition software and may include unintentional dictation errors.     Myrna Blazer, MD 11/01/17 [id number]

## 2017-11-01 NOTE — Consult Note (Signed)
[place] [facility] Consult   Reason for Consult: Consult for 52 year old woman with a history of schizoaffective disorder but also with intellectual disability and personality disorder who came into the hospital for medical complaints but then added on suicidal ideation as an extra complaint Referring Physician: [place] [name] MRN:  [id number] Principal Diagnosis: Schizoaffective disorder, bipolar type [facility]) Diagnosis:   Patient Active Problem List   Diagnosis Date Noted  . Severe recurrent major depression without psychotic features ([place]) [F33.2] 08/10/2016  . Overdose by acetaminophen [T39.1X1A] 08/08/2016  . Schizoaffective disorder, bipolar type ([place]) [F25.0]   . 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 GERD (gastroesophageal reflux disease) [K21.9] 09/24/2014  . COPD (chronic obstructive pulmonary disease) ([place]) [J44.9] 09/24/2014  . Borderline personality disorder ([place]) [F60.3] 09/24/2014  . Cerebral palsy ([place]) [G80.9] 08/31/2014    Total Time spent with patient: 1 hour  Subjective:   [name] is a 52 y.o. female patient admitted with "Dr. Weber Cooks I am suicidal homicidal".  HPI: Patient interviewed chart reviewed.  Patient known from previous encounters.  52 year old woman with chronic mental health and behavioral problems.  She comes to the emergency room today primarily complaining of back pain and flank pain but also mentions that she is "suicidal".  Does not specify a particular way that she would want to kill herself and has not done anything to harm herself.  When asked about her recent mood she says it is actually pretty good.  She just recently moved into a new group home and she likes it reasonably well.  Denies that she is having any hallucinations.  Patient tells me she just got out of [facility] a few days ago into this new living situation.  Her medications have been changed around in  the 6 weeks that she was there but she still feels exactly the same.  Social history: Patient has a legal guardian.  Resides in a group home.  Multiple hospitalizations but even more visits to the emergency room over the years.  Medical history: She has cerebral palsy and some medical issues that are chronic and some chronic back pain.  Substance abuse history: Does not abuse alcohol or drugs  Past Psychiatric History: Patient has a history of schizoaffective disorder although her presentation is more typical probably of developmental disability and possibly autistic features.  She frequently talks about hurting herself and being suicidal often without any impression that it really means much of anything.  She has done some self injury in the past but his it has been fairly minor.  Occasionally gets agitated but not severely violent.  Patient usually comes to the emergency room saying she is "suicidal" when she is frustrated and just wants a change of environment.  Risk to Self:   Risk to Others:   Prior Inpatient Therapy:   Prior Outpatient Therapy:    Past Medical History:  Past Medical History:  Diagnosis Date  . Anxiety   . Asthma   . Borderline personality disorder ([place])   . Cerebral palsy ([place])   . COPD (chronic obstructive pulmonary disease) ([place])   . Depression   . GERD (gastroesophageal reflux disease)   . Mild cognitive impairment   . Schizoaffective disorder ([place])   . Wound abscess     Past Surgical History:  Procedure Laterality Date  . CHOLECYSTECTOMY    . INCISION AND DRAINAGE ABSCESS Right 01/02/2015   Procedure: INCISION AND DRAINAGE ABSCESS;  Surgeon: Dia Crawford III, MD;  Location: [facility] ORS;  Service: General;  Laterality: Right;  . TONSILLECTOMY     Family History:  Family History  Problem Relation Age of Onset  . Heart attack Father   . Diabetes Mother    Family Psychiatric  History: Does not know of any Social History:  Social History   Substance and  Sexual Activity  Alcohol Use No     Social History   Substance and Sexual Activity  Drug Use No    Social History   Socioeconomic History  . Marital status: Single    Spouse name: Not on file  . Number of children: Not on file  . Years of education: Not on file  . Highest education level: Not on file  Occupational History  . Not on file  Social Needs  . Financial resource strain: Not on file  . Food insecurity:    Worry: Not on file    Inability: Not on file  . Transportation needs:    Medical: Not on file    Non-medical: Not on file  Tobacco Use  . Smoking status: Never Smoker  . Smokeless tobacco: Never Used  Substance and Sexual Activity  . Alcohol use: No  . Drug use: No  . Sexual activity: Not Currently    Birth control/protection: Implant  Lifestyle  . Physical activity:    Days per week: Not on file    Minutes per session: Not on file  . Stress: Not on file  Relationships  . Social connections:    Talks on phone: Not on file    Gets together: Not on file    Attends religious service: Not on file    Active member of club or organization: Not on file    Attends meetings of clubs or organizations: Not on file    Relationship status: Not on file  Other Topics Concern  . Not on file  Social History Narrative  . Not on file   Additional Social History:    Allergies:   Allergies  Allergen Reactions  . Penicillins Nausea And Vomiting and Other (See Comments)    Reaction:  No  Has patient had a PCN reaction causing immediate rash, facial/tongue/throat swelling, SOB or lightheadedness with hypotension: No Has patient had a PCN reaction causing severe rash involving mucus membranes or skin necrosis: No Has patient had a PCN reaction that required hospitalization No Has patient had a PCN reaction occurring within the last 10 years: No If all of the above answers are "NO", then may proceed with Cephalosporin use.     Labs:  Results for orders placed or  performed during the hospital encounter of 11/01/17 (from the past 48 hour(s))  Basic metabolic panel     Status: Abnormal   Collection Time: 11/01/17  4:21 PM  Result Value Ref Range   Sodium 142 135 - 145 mmol/L   Potassium 3.5 3.5 - 5.1 mmol/L   Chloride 107 98 - 111 mmol/L    Comment: Please note change in reference range.   CO2 28 22 - 32 mmol/L   Glucose, Bld 99 70 - 99 mg/dL    Comment: Please note change in reference range.   BUN 15 6 - 20 mg/dL    Comment: Please note change in reference range.   Creatinine, Ser 0.66 0.44 - 1.00 mg/dL   Calcium 8.5 (L) 8.9 - 10.3 mg/dL   GFR calc non Af Amer >60 >60 mL/min   GFR calc Af Amer >60 >60  mL/min    Comment: (NOTE) The eGFR has been calculated using the CKD EPI equation. This calculation has not been validated in all clinical situations. eGFR's persistently <60 mL/min signify possible Chronic Kidney Disease.    Anion gap 7 5 - 15    Comment: Performed at [facility], [address] [place], [place] [address]  CBC     Status: None   Collection Time: 11/01/17  4:21 PM  Result Value Ref Range   WBC 6.0 3.6 - 11.0 K/uL   RBC 4.16 3.80 - 5.20 MIL/uL   Hemoglobin 13.2 12.0 - 16.0 g/dL   HCT 39.1 35.0 - 47.0 %   MCV 93.9 80.0 - 100.0 fL   MCH 31.8 26.0 - 34.0 pg   MCHC 33.9 32.0 - 36.0 g/dL   RDW 13.3 11.5 - 14.5 %   Platelets 184 150 - 440 K/uL    Comment: Performed at [facility], [address] [place], [place] [address]  Troponin I     Status: None   Collection Time: 11/01/17  4:21 PM  Result Value Ref Range   Troponin I <0.03 <0.03 ng/mL    Comment: Performed at [facility], [address] [place], [place] [address]    No current facility-administered medications for this encounter.    Current Outpatient Medications  Medication Sig Dispense Refill  . amantadine (SYMMETREL) 100 MG capsule Take 1 capsule (100 mg total) by mouth 2 (two) times daily. 60 capsule 0  . ARIPiprazole  (ABILIFY) 5 MG tablet Take 5 mg by mouth daily.    Marland Kitchen aspirin EC 81 MG tablet Take 81 mg by mouth daily.    Marland Kitchen azithromycin (ZITHROMAX Z-PAK) 250 MG tablet Take 2 tablets (500 mg) on  Day 1,  followed by 1 tablet (250 mg) once daily on Days 2 through 5. 6 each 0  . benzonatate (TESSALON PERLES) 100 MG capsule Take 1 capsule (100 mg total) by mouth 3 (three) times daily as needed for cough. 30 capsule 0  . cephALEXin (KEFLEX) 500 MG capsule Take 1 capsule (500 mg total) by mouth 3 (three) times daily. (Patient not taking: Reported on 03/13/2017) 15 capsule 0  . cetirizine (ZYRTEC) 10 MG tablet Take 10 mg by mouth daily.    . clonazePAM (KLONOPIN) 0.5 MG tablet Take 0.5 mg by mouth 2 (two) times daily.     . divalproex (DEPAKOTE) 500 MG DR tablet Take 500 mg by mouth 2 (two) times daily.     Marland Kitchen escitalopram (LEXAPRO) 20 MG tablet Take 30 mg by mouth daily.     . fenofibrate 160 MG tablet Take 160 mg by mouth daily.    . Fluticasone-Salmeterol (ADVAIR) 100-50 MCG/DOSE AEPB Inhale 1 puff into the lungs 2 (two) times daily.     . furosemide (LASIX) 20 MG tablet Take 20 mg by mouth daily.    Marland Kitchen HYDROcodone-acetaminophen (NORCO/VICODIN) 5-325 MG tablet Take 1 tablet by mouth every 4 (four) hours as needed. (Patient not taking: Reported on 05/02/2017) 12 tablet 0  . hydrocortisone 2.5 % cream Apply 1 application topically 2 (two) times daily.    Marland Kitchen loperamide (IMODIUM) 2 MG capsule Take 1 capsule (2 mg total) by mouth 4 (four) times daily as needed for diarrhea or loose stools. 12 capsule 0  . Omega-3 Fatty Acids (FISH OIL PO) Take 1 capsule by mouth daily.    . pantoprazole (PROTONIX) 40 MG tablet Take 40 mg by mouth daily.     Marland Kitchen  potassium chloride (K-DUR) 10 MEQ tablet Take 10 mEq by mouth daily.    . sucralfate (CARAFATE) 1 g tablet Take 1 g by mouth 4 (four) times daily -  with meals and at bedtime.      Musculoskeletal: Strength & Muscle Tone: within normal limits Gait & Station: normal Patient leans:  N/A  Psychiatric Specialty Exam: Physical Exam  Nursing note and vitals reviewed. Constitutional: She appears well-developed and well-nourished.  HENT:  Head: Normocephalic and atraumatic.  Eyes: Pupils are equal, round, and reactive to light. Conjunctivae are normal.  Neck: Normal range of motion.  Cardiovascular: Regular rhythm and normal heart sounds.  Respiratory: Effort normal. No respiratory distress.  GI: Soft. She exhibits no distension.  Musculoskeletal: Normal range of motion.  Neurological: She is alert.  Skin: Skin is warm and dry.  Psychiatric: Her affect is blunt. Her speech is delayed. She is slowed. Cognition and memory are impaired. She expresses impulsivity. She expresses suicidal ideation. She expresses no suicidal plans.    Review of Systems  Constitutional: Negative.   HENT: Negative.   Eyes: Negative.   Respiratory: Negative.   Cardiovascular: Negative.   Gastrointestinal: Negative.   Genitourinary: Positive for flank pain.  Musculoskeletal: Positive for back pain.  Skin: Negative.   Neurological: Negative.   Psychiatric/Behavioral: Positive for depression and suicidal ideas.    Blood pressure 100/65, pulse 70, temperature 99.3 F (37.4 C), temperature source Oral, resp. rate 18, height '5\' 3"'  (1.6 m), weight 82.1 kg (181 lb), SpO2 95 %.Body mass index is 32.06 kg/m.  General Appearance: Casual  Eye Contact:  Good  Speech:  Clear and Coherent  Volume:  Decreased  Mood:  Anxious and Depressed  Affect:  Constricted  Thought Process:  Goal Directed  Orientation:  Full (Time, Place, and Person)  Thought Content:  Rumination and Tangential  Suicidal Thoughts:  Yes.  without intent/plan  Homicidal Thoughts:  No  Memory:  Immediate;   Fair Recent;   Fair Remote;   Fair  Judgement:  Poor  Insight:  Shallow  Psychomotor Activity:  Decreased  Concentration:  Concentration: Fair  Recall:  [employer] of Knowledge:  Fair  Language:  Fair  Akathisia:  No   Handed:  Right  AIMS (if indicated):     Assets:  Desire for Improvement Housing Social Support  ADL's:  Impaired  Cognition:  Impaired,  Mild and Moderate  Sleep:        Treatment Plan Summary: Plan 52 year old woman with chronic behavior problems well-known to the system.  She presents today actually probably more lucid and in less distress than I have seen her many times before.  She is very vague about her suppose it "suicidal or homicidal" thoughts.  Admits she has no actual plan or wish to die.  Does not require IV C.  Does not require inpatient hospitalization.  No change to medicine.  Spent some time as usual doing some supportive encouragement.  Case reviewed with ER doctor.  She can be released after her medical issues are addressed.  Disposition: No evidence of imminent risk to self or others at present.   Patient does not meet criteria for psychiatric inpatient admission. Supportive therapy provided about ongoing stressors. Discussed crisis plan, support from social network, calling 911, coming to the Emergency Department, and calling Suicide Hotline.  Alethia Berthold, MD 11/01/2017 6:07 PM

## 2017-11-01 NOTE — ED Notes (Signed)
VOL/ Consult ordered  

## 2017-11-01 NOTE — ED Notes (Signed)
Dr clapacs with pt

## 2017-11-01 NOTE — ED Notes (Signed)
[name][facility]

## 2017-11-01 NOTE — ED Notes (Signed)
Pt reports chest pain x 2 hours.  Pt was brought in via ems from a group home.  Pt reports feeling SI.  Denies drug or etoh use.  Pt states chest pain in center of chest and reports she wants to bite herself.  No bite marks noted on pt.  Pt is calm and cooperative in a hallway bed.  Pt also eating a dinner tray.  md at bedside.

## 2017-11-01 NOTE — ED Notes (Signed)
Attempt to call Group home at [phone]. Went to VM, unable to leave message as VM was not set up. Tried number on Face sheet for facility on [address] [phone], invalid number.

## 2017-11-01 NOTE — ED Notes (Addendum)
Report off to Kasey rn  

## 2017-11-01 NOTE — ED Notes (Signed)
Retrieved patients bag with belongings from the [facility] belongings closet. ( EDT assigned to BHU opened closet for me with her key and was present when I left). I handed belonging bag to patient directly while she was in the restroom.

## 2017-11-04 LAB — URINE CULTURE: Culture: >=100000 — AB

## 2017-11-12 ENCOUNTER — Other Ambulatory Visit: Payer: Self-pay

## 2017-11-12 ENCOUNTER — Emergency Department
Admission: EM | Admit: 2017-11-12 | Discharge: 2017-11-12 | Disposition: A | Discharge status: Home / Self Care | Payer: Medicare Other | Attending: Emergency Medicine | Admitting: Emergency Medicine

## 2017-11-12 ENCOUNTER — Emergency Department: Payer: Medicare Other

## 2017-11-12 DIAGNOSIS — Z7982 Long term (current) use of aspirin: Secondary | ICD-10-CM | POA: Insufficient documentation

## 2017-11-12 DIAGNOSIS — F603 Borderline personality disorder: Secondary | ICD-10-CM | POA: Diagnosis present

## 2017-11-12 DIAGNOSIS — F259 Schizoaffective disorder, unspecified: Secondary | ICD-10-CM

## 2017-11-12 DIAGNOSIS — J45909 Unspecified asthma, uncomplicated: Secondary | ICD-10-CM | POA: Insufficient documentation

## 2017-11-12 DIAGNOSIS — R45851 Suicidal ideations: Secondary | ICD-10-CM | POA: Diagnosis not present

## 2017-11-12 DIAGNOSIS — F70 Mild intellectual disabilities: Secondary | ICD-10-CM | POA: Diagnosis present

## 2017-11-12 DIAGNOSIS — R079 Chest pain, unspecified: Secondary | ICD-10-CM | POA: Insufficient documentation

## 2017-11-12 DIAGNOSIS — G8929 Other chronic pain: Secondary | ICD-10-CM | POA: Diagnosis not present

## 2017-11-12 DIAGNOSIS — Z79899 Other long term (current) drug therapy: Secondary | ICD-10-CM | POA: Diagnosis not present

## 2017-11-12 LAB — CBC
HEMATOCRIT: 36.3 % (ref 35.0–47.0)
Hemoglobin: 12.5 g/dL (ref 12.0–16.0)
MCH: 32 pg (ref 26.0–34.0)
MCHC: 34.4 g/dL (ref 32.0–36.0)
MCV: 93.2 fL (ref 80.0–100.0)
Platelets: 207 10*3/uL (ref 150–440)
RBC: 3.89 MIL/uL (ref 3.80–5.20)
RDW: 13.8 % (ref 11.5–14.5)
WBC: 6.6 10*3/uL (ref 3.6–11.0)

## 2017-11-12 LAB — URINALYSIS, COMPLETE (UACMP) WITH MICROSCOPIC
Bacteria, UA: NONE SEEN
Bilirubin Urine: NEGATIVE
GLUCOSE, UA: NEGATIVE mg/dL
Hgb urine dipstick: NEGATIVE
Ketones, ur: NEGATIVE mg/dL
Nitrite: NEGATIVE
PROTEIN: NEGATIVE mg/dL
Specific Gravity, Urine: 1.023 (ref 1.005–1.030)
pH: 6 (ref 5.0–8.0)

## 2017-11-12 LAB — BASIC METABOLIC PANEL
Anion gap: 8 (ref 5–15)
BUN: 18 mg/dL (ref 6–20)
CALCIUM: 8.6 mg/dL — AB (ref 8.9–10.3)
CO2: 28 mmol/L (ref 22–32)
Chloride: 108 mmol/L (ref 98–111)
Creatinine, Ser: 0.44 mg/dL (ref 0.44–1.00)
GFR calc Af Amer: >60 mL/min (ref >60)
GLUCOSE: 109 mg/dL — AB (ref 70–99)
POTASSIUM: 3.3 mmol/L — AB (ref 3.5–5.1)
Sodium: 144 mmol/L (ref 135–145)

## 2017-11-12 LAB — TROPONIN I: Troponin I: <0.03 ng/mL (ref <0.03)

## 2017-11-12 MED ORDER — SODIUM CHLORIDE 0.9 % IV BOLUS
500.0000 mL | Freq: Once | INTRAVENOUS | Status: AC
  Administered 2017-11-12: 500 mL via INTRAVENOUS

## 2017-11-12 NOTE — ED Notes (Signed)
[facility] PAPERS  RESCINDED  PER  DR  CLAPACS  MD  INFORMED  DEE RN  AND ODS

## 2017-11-12 NOTE — ED Notes (Signed)
This RN called [facility] to check on status of someone picking up pt. [name] stated that her boss was on the way [place]

## 2017-11-12 NOTE — ED Provider Notes (Signed)
[facility] Emergency Department Provider Note   ____________________________________________   First MD Initiated Contact with Patient 11/12/17 1549     (approximate)  I have reviewed the triage vital signs and the nursing notes.   HISTORY  Chief Complaint Felt like I want to jump out of a car or an ambulance.   HPI [name] is a 52 y.o. female reports she is here because she is having thoughts about wanting to kill herself again.  She would like to be able to jump out of the car or an ambulance to kill herself.  Reports she was released from [facility] about a month ago, continue to take her medication but her symptoms where she wants to kill herself or coming back.  She denies taking any action to harm her self today, but called EMS.  She also reports that she mentioned that she is having chest discomfort, but she is been having that daily for about a month now, she reports is been going "since I came to the ER last time".  Describes as a light slight discomfort in the chest that is occasionally bothersome.  Given aspirin by EMS  Continues to endorse suicidal ideation, with a plan to jump from a car.  Denies exertional chest pain.  Denies changes with exertion.  Currently reports just a very mild discomfort in the chest, reports it comes and goes over the months time.  No trouble breathing.  No leg swelling.  Past Medical History:  Diagnosis Date  . Anxiety   . Asthma   . Borderline personality disorder (HCC)   . Cerebral palsy (HCC)   . COPD (chronic obstructive pulmonary disease) (HCC)   . Depression   . GERD (gastroesophageal reflux disease)   . Mild cognitive impairment   . Schizoaffective disorder (HCC)   . Wound abscess     Patient Active Problem List   Diagnosis Date Noted  . Severe recurrent major depression without psychotic features (HCC) 08/10/2016  . Overdose by acetaminophen 08/08/2016  . Schizoaffective disorder,  bipolar type (HCC)   . 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 GERD (gastroesophageal reflux disease) 09/24/2014  . COPD (chronic obstructive pulmonary disease) (HCC) 09/24/2014  . Borderline personality disorder (HCC) 09/24/2014  . Cerebral palsy (HCC) 08/31/2014    Past Surgical History:  Procedure Laterality Date  . CHOLECYSTECTOMY    . INCISION AND DRAINAGE ABSCESS Right 01/02/2015   Procedure: INCISION AND DRAINAGE ABSCESS;  Surgeon: Tiney Rouge III, MD;  Location: [facility] ORS;  Service: General;  Laterality: Right;  . TONSILLECTOMY      Prior to Admission medications   Medication Sig Start Date End Date Taking? Authorizing Provider  amantadine (SYMMETREL) 100 MG capsule Take 1 capsule (100 mg total) by mouth 2 (two) times daily. 08/14/16   Jimmy Footman, MD  ARIPiprazole (ABILIFY) 5 MG tablet Take 5 mg by mouth daily.    [provider]  aspirin EC 81 MG tablet Take 81 mg by mouth daily.    [provider]  azithromycin (ZITHROMAX Z-PAK) 250 MG tablet Take 2 tablets (500 mg) on  Day 1,  followed by 1 tablet (250 mg) once daily on Days 2 through 5. 05/23/17   Enid Derry, PA-C  benzonatate (TESSALON PERLES) 100 MG capsule Take 1 capsule (100 mg total) by mouth 3 (three) times daily as needed for cough. 05/23/17 05/23/18  Enid Derry, PA-C  cetirizine (ZYRTEC) 10 MG tablet Take 10 mg by mouth daily.  [provider]  clonazePAM (KLONOPIN) 0.5 MG tablet Take 0.5 mg by mouth 2 (two) times daily.     [provider]  divalproex (DEPAKOTE) 500 MG DR tablet Take 500 mg by mouth 2 (two) times daily.     [provider]  escitalopram (LEXAPRO) 20 MG tablet Take 30 mg by mouth daily.     [provider]  fenofibrate 160 MG tablet Take 160 mg by mouth daily.    [provider]  Fluticasone-Salmeterol (ADVAIR) 100-50 MCG/DOSE AEPB Inhale 1 puff into the lungs 2 (two) times daily.     [provider]  furosemide  (LASIX) 20 MG tablet Take 20 mg by mouth daily.    [provider]  HYDROcodone-acetaminophen (NORCO/VICODIN) 5-325 MG tablet Take 1 tablet by mouth every 4 (four) hours as needed. Patient not taking: Reported on 05/02/2017 03/13/17   Linwood DibblesKnapp, Jon, MD  hydrocortisone 2.5 % cream Apply 1 application topically 2 (two) times daily.    [provider]  loperamide (IMODIUM) 2 MG capsule Take 1 capsule (2 mg total) by mouth 4 (four) times daily as needed for diarrhea or loose stools. 04/21/17   Burgess AmorIdol, Julie, PA-C  Omega-3 Fatty Acids (FISH OIL PO) Take 1 capsule by mouth daily.    [provider]  pantoprazole (PROTONIX) 40 MG tablet Take 40 mg by mouth daily.     [provider]  potassium chloride (K-DUR) 10 MEQ tablet Take 10 mEq by mouth daily.    [provider]  sucralfate (CARAFATE) 1 g tablet Take 1 g by mouth 4 (four) times daily -  with meals and at bedtime.    [provider]    Allergies Penicillins  Family History  Problem Relation Age of Onset  . Heart attack Father   . Diabetes Mother     Social History Social History   Tobacco Use  . Smoking status: Never Smoker  . Smokeless tobacco: Never Used  Substance Use Topics  . Alcohol use: No  . Drug use: No    Review of Systems Constitutional: No fever/chills Eyes: No visual changes. ENT: No sore throat. Cardiovascular: See HPI Respiratory: Denies shortness of breath. Gastrointestinal: No abdominal pain.  No nausea, no vomiting.  No diarrhea.  No constipation. Genitourinary: Some occasional discomfort with urination over the last month. Musculoskeletal: Negative for back pain. Skin: Negative for rash. Neurological: Negative for headaches, focal weakness or numbness.    ____________________________________________   PHYSICAL EXAM:  VITAL SIGNS: ED Triage Vitals  Enc Vitals Group     BP 11/12/17 1551 (!) 94/59     Pulse Rate 11/12/17 1551 78     Resp 11/12/17 1551  (!) 27     Temp 11/12/17 1551 98.3 F (36.8 C)     Temp Source 11/12/17 1551 Oral     SpO2 11/12/17 1551 95 %     Weight 11/12/17 1545 181 lb 1 oz (82.1 kg)     Height 11/12/17 1545 5\' 3"  (1.6 m)     Head Circumference --      Peak Flow --      Pain Score 11/12/17 1545 0     Pain Loc --      Pain Edu? --      Excl. in GC? --     Constitutional: Alert and oriented. Well appearing and in no acute distress.  Very flattened affect. Eyes: Conjunctivae are normal. Head: Atraumatic. Nose: No congestion/rhinnorhea. Mouth/Throat: Mucous membranes are slightly  dry, patient reports she feels thirsty often. Neck: No stridor.   Cardiovascular: Normal rate, regular rhythm. Grossly normal heart sounds.  Good peripheral circulation. Respiratory: Normal respiratory effort.  No retractions. Lungs CTAB. Gastrointestinal: Soft and nontender. No distention. Musculoskeletal: No lower extremity tenderness nor edema. Neurologic:  Normal speech and language. No gross focal neurologic deficits are appreciated.  Skin:  Skin is warm, dry and intact. No rash noted. Psychiatric: Mood and affect are normal. Speech and behavior are normal.  ____________________________________________   LABS (all labs ordered are listed, but only abnormal results are displayed)  Labs Reviewed  BASIC METABOLIC PANEL - Abnormal; Notable for the following components:      Result Value   Potassium 3.3 (*)    Glucose, Bld 109 (*)    Calcium 8.6 (*)    All other components within normal limits  URINALYSIS, COMPLETE (UACMP) WITH MICROSCOPIC - Abnormal; Notable for the following components:   Color, Urine YELLOW (*)    APPearance CLEAR (*)    Leukocytes, UA TRACE (*)    All other components within normal limits  URINE CULTURE  CBC  TROPONIN I  ACETAMINOPHEN LEVEL   ____________________________________________  EKG  Reviewed enterotomy at 1559 heart rate 80 QRS 99 QTc 440 Normal sinus rhythm, no evidence of acute  ischemia, occasional PVC. ____________________________________________  RADIOLOGY  Dg Chest 2 View  Result Date: 11/12/2017 CLINICAL DATA:  Initial evaluation for acute chest pain. History of asthma, COPD. EXAM: CHEST - 2 VIEW COMPARISON:  Prior radiograph from earlier the same day. FINDINGS: Cardiac and mediastinal silhouettes within normal limits. Lungs hypoinflated. Mild streaky left basilar opacity again seen on frontal view, felt to be most consistent with atelectatic changes, with no focal infiltrates seen on lateral projection. No edema or pleural effusion. No other focal airspace disease. No pneumothorax. Mild underlying COPD. No acute osseous abnormality. IMPRESSION: 1. Similar appearance of streaky left basilar opacity, favored to reflect atelectatic changes, with no focal infiltrates seen on this two view examination of the chest. 2. No other active cardiopulmonary disease. 3. Mild underlying COPD. Electronically Signed   By: Rise Mu M.D.   On: 11/12/2017 18:21   Dg Chest Port 1 View  Result Date: 11/12/2017 CLINICAL DATA:  Chest pain and weakness beginning this morning. History of asthma and COPD. EXAM: PORTABLE CHEST 1 VIEW COMPARISON:  11/01/2017. FINDINGS: Heart size is normal. Mediastinal shadows are normal. The upper lungs are clear. Question mild infiltrate or atelectasis at the left lung base. Consider two-view chest radiography. No evidence of effusion. No acute bone finding. IMPRESSION: Question left base atelectasis or early infiltrate. Consider two-view chest radiography. Electronically Signed   By: Paulina Fusi M.D.   On: 11/12/2017 16:42    ____________________________________________   PROCEDURES  Procedure(s) performed: None  Procedures  Critical Care performed: No  ____________________________________________   INITIAL IMPRESSION / ASSESSMENT AND PLAN / ED COURSE  Pertinent labs & imaging results that were available during my care of the patient  were reviewed by me and considered in my medical decision making (see chart for details).  1) suicidal ideations.  Patient with a long known history of suicidal ideations it does seem to be recurrent in nature.  She is voluntary at present as she actively wishes to be seen by psychiatry to discuss her symptoms, she is agreeable with waiting to see her psychiatrist.  Overall, I suspect her risk for attempting suicide is very low and she does have a history of previous  evaluations with similar symptoms, but also does have a well-known strong psychiatric history.  She currently has a Comptroller with her.  2) 1 month of chest discomfort.  Reports is been ongoing for at least a month, symptoms very atypical of ACS.  EKG reassuring.  First troponin normal, and given the ongoing nature of a month of discomfort I do not think a second troponin is necessary at this time.  Feel unlikely to represent ACS. Differential diagnosis includes, but is not limited to, ACS, aortic dissection, pulmonary embolism, cardiac tamponade, pneumothorax, pneumonia, pericarditis, myocarditis, GI-related causes including esophagitis/gastritis, and musculoskeletal chest wall pain.       ----------------------------------------- 6:44 PM on 11/12/2017 -----------------------------------------  Patient seen by psychiatry, Dr. Toni Amend he recommends discharge and release of the release the patient from commitment.  Patient will follow-up with her primary provider, be returning to her group home setting.  Regarding the patient's chest pain, I do not find any acute cause.  Chest x-ray findings reviewed, patient denies fever cough or infectious symptoms, in addition normal white count afebrile here.  Doubt infiltrate/pneumonia.  Does have some mild low blood pressure noted, but on previous visits is also had low to normal blood pressures.  She is asymptomatic, denies any weakness fatigue lightheadedness or other symptoms to suggest an acute  concern and presently SBP 104 after small bolus.  Patient will be discharged into the care of her care home.  ____________________________________________   FINAL CLINICAL IMPRESSION(S) / ED DIAGNOSES  Final diagnoses:  Chest pain with low risk for cardiac etiology  Chronic chest pain  Schizoaffective disorder, unspecified type (HCC)      NEW MEDICATIONS STARTED DURING THIS VISIT:  New Prescriptions   No medications on file     Note:  This document was prepared using Dragon voice recognition software and may include unintentional dictation errors.     Sharyn Creamer, MD 11/12/17 [phone]

## 2017-11-12 NOTE — ED Notes (Signed)
Safety sitter at bedside 

## 2017-11-12 NOTE — ED Notes (Signed)
PT  IVC  PER  DR  Fanny BienQUALE  MD  INFORMED  NURSE  DEE  AND  ODS  OFFICER

## 2017-11-12 NOTE — ED Notes (Signed)
Call placed to [facility] group [facility] [phone] and spoke with Payton SparkShaunie that states that someone will be out to come and pick up the pt for discharge.

## 2017-11-12 NOTE — Consult Note (Signed)
[facility] Face-to-Face Psychiatry Consult   Reason for Consult: Consult for this 52 year old woman well-known to the psychiatric service who came in once again saying that she was "suicidal" Referring Physician: [place] Patient Identification: Maxima Skelton MRN:  [id number] Principal Diagnosis: Mild intellectual disability Diagnosis:   Patient Active Problem List   Diagnosis Date Noted  . Severe recurrent major depression without psychotic features ([place]) [F33.2] 08/10/2016  . Overdose by acetaminophen [T39.1X1A] 08/08/2016  . Schizoaffective disorder, bipolar type ([place]) [F25.0]   . 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 GERD (gastroesophageal reflux disease) [K21.9] 09/24/2014  . COPD (chronic obstructive pulmonary disease) ([place]) [J44.9] 09/24/2014  . Borderline personality disorder ([place]) [F60.3] 09/24/2014  . Cerebral palsy ([place]) [G80.9] 08/31/2014    Total Time spent with patient: 1 hour  Subjective:   Isabele Lollar is a 52 y.o. female patient admitted with "chest pain and also I am suicidal and homicidal".  HPI: Patient seen chart reviewed.  Patient is very well-known to me from multiple prior encounters.  52 year old woman with developmental disability comes to the emergency room today complaining of chest pain and abdominal pain but also adding on that she is suicidal and homicidal.  When I talked to her she said she is homicidal towards the people that she lives with.  When asked to be more specific she says that her roommate gets on her nerves and she thought about hitting her but decided not to.  Patient says that she might go home and walk into traffic.  Complains as usual about her group home.  Patient's affect and behavior are the same as usual.  No sign of delusional thinking.  No sign of hallucinations.  Social history: Lives in a group home.  Chronically dissatisfied.  Medical history: Gastric reflux COPD cerebral palsy  Substance abuse history: None  Past  Psychiatric History: Patient is very well-known because she presents to the emergency room with remarkable frequency and always has the same complaints that she is "suicidal and homicidal".  She has long made it clear that what she wants is to be admitted to the unit downstairs because she finds the psychiatric ward here more pleasant than being at home at her group home because she says at the group homes they often speak to her rudely and she has to put up with people being unpleasant.  Patient has no history of actual suicide attempts or violence to others.  No benefit from inpatient treatment in the past.  Well-known intellectual disability.  Patient has been discharged multiple times from the emergency room in the past for identical presentation is what she has today without having hurt her self  Risk to Self:   Risk to Others:   Prior Inpatient Therapy:   Prior Outpatient Therapy:    Past Medical History:  Past Medical History:  Diagnosis Date  . Anxiety   . Asthma   . Borderline personality disorder ([place])   . Cerebral palsy ([place])   . COPD (chronic obstructive pulmonary disease) ([place])   . Depression   . GERD (gastroesophageal reflux disease)   . Mild cognitive impairment   . Schizoaffective disorder ([place])   . Wound abscess     Past Surgical History:  Procedure Laterality Date  . CHOLECYSTECTOMY    . INCISION AND DRAINAGE ABSCESS Right 01/02/2015   Procedure: INCISION AND DRAINAGE ABSCESS;  Surgeon: Dia Crawford III, MD;  Location: [facility] ORS;  Service: General;  Laterality: Right;  . TONSILLECTOMY     Family History:  Family History  Problem Relation Age of Onset  . Heart attack Father   . Diabetes Mother    Family Psychiatric  History: None known Social History:  Social History   Substance and Sexual Activity  Alcohol Use No     Social History   Substance and Sexual Activity  Drug Use No    Social History   Socioeconomic History  . Marital status: Single    Spouse  name: Not on file  . Number of children: Not on file  . Years of education: Not on file  . Highest education level: Not on file  Occupational History  . Not on file  Social Needs  . Financial resource strain: Not on file  . Food insecurity:    Worry: Not on file    Inability: Not on file  . Transportation needs:    Medical: Not on file    Non-medical: Not on file  Tobacco Use  . Smoking status: Never Smoker  . Smokeless tobacco: Never Used  Substance and Sexual Activity  . Alcohol use: No  . Drug use: No  . Sexual activity: Not Currently    Birth control/protection: Implant  Lifestyle  . Physical activity:    Days per week: Not on file    Minutes per session: Not on file  . Stress: Not on file  Relationships  . Social connections:    Talks on phone: Not on file    Gets together: Not on file    Attends religious service: Not on file    Active member of club or organization: Not on file    Attends meetings of clubs or organizations: Not on file    Relationship status: Not on file  Other Topics Concern  . Not on file  Social History Narrative  . Not on file   Additional Social History:    Allergies:   Allergies  Allergen Reactions  . Penicillins Nausea And Vomiting and Other (See Comments)    Reaction:  No  Has patient had a PCN reaction causing immediate rash, facial/tongue/throat swelling, SOB or lightheadedness with hypotension: No Has patient had a PCN reaction causing severe rash involving mucus membranes or skin necrosis: No Has patient had a PCN reaction that required hospitalization No Has patient had a PCN reaction occurring within the last 10 years: No If all of the above answers are "NO", then may proceed with Cephalosporin use.     Labs:  Results for orders placed or performed during the hospital encounter of 11/12/17 (from the past 48 hour(s))  Basic metabolic panel     Status: Abnormal   Collection Time: 11/12/17  3:52 PM  Result Value Ref Range    Sodium 144 135 - 145 mmol/L   Potassium 3.3 (L) 3.5 - 5.1 mmol/L   Chloride 108 98 - 111 mmol/L   CO2 28 22 - 32 mmol/L   Glucose, Bld 109 (H) 70 - 99 mg/dL   BUN 18 6 - 20 mg/dL   Creatinine, Ser 0.44 0.44 - 1.00 mg/dL   Calcium 8.6 (L) 8.9 - 10.3 mg/dL   GFR calc non Af Amer >60 >60 mL/min   GFR calc Af Amer >60 >60 mL/min    Comment: (NOTE) The eGFR has been calculated using the CKD EPI equation. This calculation has not been validated in all clinical situations. eGFR's persistently <60 mL/min signify possible Chronic Kidney Disease.    Anion gap 8 5 - 15    Comment: Performed at  [facility], [address] [place], [place] [address]  CBC     Status: None   Collection Time: 11/12/17  3:52 PM  Result Value Ref Range   WBC 6.6 3.6 - 11.0 K/uL   RBC 3.89 3.80 - 5.20 MIL/uL   Hemoglobin 12.5 12.0 - 16.0 g/dL   HCT 36.3 35.0 - 47.0 %   MCV 93.2 80.0 - 100.0 fL   MCH 32.0 26.0 - 34.0 pg   MCHC 34.4 32.0 - 36.0 g/dL   RDW 13.8 11.5 - 14.5 %   Platelets 207 150 - 440 K/uL    Comment: Performed at [facility], [address] [place], [place] [address]  Troponin I     Status: None   Collection Time: 11/12/17  3:52 PM  Result Value Ref Range   Troponin I <0.03 <0.03 ng/mL    Comment: Performed at [facility], [address] [place], [place] [address]  Urinalysis, Complete w Microscopic     Status: Abnormal   Collection Time: 11/12/17  4:07 PM  Result Value Ref Range   Color, Urine YELLOW (A) YELLOW   APPearance CLEAR (A) CLEAR   Specific Gravity, Urine 1.023 1.005 - 1.030   pH 6.0 5.0 - 8.0   Glucose, UA NEGATIVE NEGATIVE mg/dL   Hgb urine dipstick NEGATIVE NEGATIVE   Bilirubin Urine NEGATIVE NEGATIVE   Ketones, ur NEGATIVE NEGATIVE mg/dL   Protein, ur NEGATIVE NEGATIVE mg/dL   Nitrite NEGATIVE NEGATIVE   Leukocytes, UA TRACE (A) NEGATIVE   RBC / HPF 0-5 0 - 5 RBC/hpf   WBC, UA 11-20 0 - 5 WBC/hpf   Bacteria, UA NONE SEEN NONE SEEN    Squamous Epithelial / LPF 0-5 0 - 5   Mucus PRESENT     Comment: Performed at [facility], [address] [place], [place] [address]    No current facility-administered medications for this encounter.    Current Outpatient Medications  Medication Sig Dispense Refill  . amantadine (SYMMETREL) 100 MG capsule Take 1 capsule (100 mg total) by mouth 2 (two) times daily. 60 capsule 0  . ARIPiprazole (ABILIFY) 5 MG tablet Take 5 mg by mouth daily.    Marland Kitchen aspirin EC 81 MG tablet Take 81 mg by mouth daily.    Marland Kitchen azithromycin (ZITHROMAX Z-PAK) 250 MG tablet Take 2 tablets (500 mg) on  Day 1,  followed by 1 tablet (250 mg) once daily on Days 2 through 5. 6 each 0  . benzonatate (TESSALON PERLES) 100 MG capsule Take 1 capsule (100 mg total) by mouth 3 (three) times daily as needed for cough. 30 capsule 0  . cetirizine (ZYRTEC) 10 MG tablet Take 10 mg by mouth daily.    . clonazePAM (KLONOPIN) 0.5 MG tablet Take 0.5 mg by mouth 2 (two) times daily.     . divalproex (DEPAKOTE) 500 MG DR tablet Take 500 mg by mouth 2 (two) times daily.     Marland Kitchen escitalopram (LEXAPRO) 20 MG tablet Take 30 mg by mouth daily.     . fenofibrate 160 MG tablet Take 160 mg by mouth daily.    . Fluticasone-Salmeterol (ADVAIR) 100-50 MCG/DOSE AEPB Inhale 1 puff into the lungs 2 (two) times daily.     . furosemide (LASIX) 20 MG tablet Take 20 mg by mouth daily.    Marland Kitchen HYDROcodone-acetaminophen (NORCO/VICODIN) 5-325 MG tablet Take 1 tablet by mouth every 4 (four) hours as needed. (Patient not taking: Reported on 05/02/2017) 12  tablet 0  . hydrocortisone 2.5 % cream Apply 1 application topically 2 (two) times daily.    Marland Kitchen loperamide (IMODIUM) 2 MG capsule Take 1 capsule (2 mg total) by mouth 4 (four) times daily as needed for diarrhea or loose stools. 12 capsule 0  . Omega-3 Fatty Acids (FISH OIL PO) Take 1 capsule by mouth daily.    . pantoprazole (PROTONIX) 40 MG tablet Take 40 mg by mouth daily.     . potassium chloride (K-DUR)  10 MEQ tablet Take 10 mEq by mouth daily.    . sucralfate (CARAFATE) 1 g tablet Take 1 g by mouth 4 (four) times daily -  with meals and at bedtime.      Musculoskeletal: Strength & Muscle Tone: within normal limits Gait & Station: normal Patient leans: N/A  Psychiatric Specialty Exam: Physical Exam  Nursing note and vitals reviewed. Constitutional: She appears well-developed and well-nourished.  HENT:  Head: Normocephalic and atraumatic.  Eyes: Pupils are equal, round, and reactive to light. Conjunctivae are normal.  Neck: Normal range of motion.  Cardiovascular: Regular rhythm and normal heart sounds.  Respiratory: Effort normal. No respiratory distress.  GI: Soft.  Musculoskeletal: Normal range of motion.  Neurological: She is alert.  Skin: Skin is warm and dry.  Psychiatric: Her speech is normal and behavior is normal. Her affect is blunt. Cognition and memory are impaired. She expresses impulsivity. She expresses suicidal ideation. She expresses no suicidal plans.    Review of Systems  Constitutional: Negative.   HENT: Negative.   Eyes: Negative.   Respiratory: Negative.   Cardiovascular: Positive for chest pain.  Gastrointestinal: Negative.   Musculoskeletal: Negative.   Skin: Negative.   Neurological: Negative.   Psychiatric/Behavioral: Positive for depression and suicidal ideas. Negative for hallucinations, memory loss and substance abuse. The patient is nervous/anxious. The patient does not have insomnia.     Blood pressure 95/62, pulse 80, temperature 98.3 F (36.8 C), temperature source Oral, resp. rate (!) 25, height '5\' 3"'  (1.6 m), weight 82.1 kg (181 lb 1 oz), SpO2 95 %.Body mass index is 32.07 kg/m.  General Appearance: Casual  Eye Contact:  Fair  Speech:  Clear and Coherent  Volume:  Normal  Mood:  Dysphoric  Affect:  Congruent  Thought Process:  Disorganized  Orientation:  Full (Time, Place, and Person)  Thought Content:  Illogical and Tangential   Suicidal Thoughts:  Yes.  without intent/plan  Homicidal Thoughts:  No  Memory:  Immediate;   Fair Recent;   Poor Remote;   Poor  Judgement:  Impaired  Insight:  Shallow  Psychomotor Activity:  Normal  Concentration:  Concentration: Fair  Recall:  [employer] of Knowledge:  Fair  Language:  Fair  Akathisia:  No  Handed:  Right  AIMS (if indicated):     Assets:  Desire for Improvement Housing  ADL's:  Intact  Cognition:  Impaired,  Mild  Sleep:        Treatment Plan Summary: Plan 52 year old woman with developmental disability and chronic presentations claiming to be suicidal without any evidence that she is acting on it or has any change in her symptoms.  Patient given supportive encouragement and counseling and urged to continue the good work she does on keeping her temper.  There is no indication that she really meets commitment criteria nor requires inpatient hospitalization.  Unfortunately this is her chronic presentation.  Case reviewed with emergency room doctor.  I have discontinued her IV C and recommend  that she can be released when medically stable.  Disposition: No evidence of imminent risk to self or others at present.   Patient does not meet criteria for psychiatric inpatient admission. Supportive therapy provided about ongoing stressors. Discussed crisis plan, support from social network, calling 911, coming to the Emergency Department, and calling Suicide Hotline.  Alethia Berthold, MD 11/12/2017 6:40 PM

## 2017-11-12 NOTE — ED Notes (Signed)
PT  IVC  PER  DR  Fanny BienQUALE  MD  INFORMED  NURSE  DEE  AND  ODS  OFFICER AND  BILL  CHARGE NURSE

## 2017-11-12 NOTE — ED Triage Notes (Signed)
Pt arrived via ems with chest pain and weakness that started around 1000. Pt given 324 of aspirin and 4 of zofran for nausea. Pt BP was 100/60. Pt also having suicidal thoughts. Pt NAD at present time.

## 2017-11-14 LAB — URINE CULTURE: Special Requests: NORMAL

## 2017-11-20 ENCOUNTER — Other Ambulatory Visit: Payer: Self-pay

## 2017-11-20 ENCOUNTER — Emergency Department
Admission: EM | Admit: 2017-11-20 | Discharge: 2017-11-20 | Disposition: A | Discharge status: Home / Self Care | Payer: Medicare Other | Attending: Emergency Medicine | Admitting: Emergency Medicine

## 2017-11-20 DIAGNOSIS — F329 Major depressive disorder, single episode, unspecified: Secondary | ICD-10-CM

## 2017-11-20 DIAGNOSIS — F259 Schizoaffective disorder, unspecified: Secondary | ICD-10-CM | POA: Insufficient documentation

## 2017-11-20 DIAGNOSIS — Z79899 Other long term (current) drug therapy: Secondary | ICD-10-CM | POA: Insufficient documentation

## 2017-11-20 DIAGNOSIS — Z046 Encounter for general psychiatric examination, requested by authority: Secondary | ICD-10-CM | POA: Diagnosis present

## 2017-11-20 DIAGNOSIS — Z7982 Long term (current) use of aspirin: Secondary | ICD-10-CM | POA: Insufficient documentation

## 2017-11-20 DIAGNOSIS — J449 Chronic obstructive pulmonary disease, unspecified: Secondary | ICD-10-CM | POA: Diagnosis not present

## 2017-11-20 DIAGNOSIS — F32A Depression, unspecified: Secondary | ICD-10-CM

## 2017-11-20 LAB — CBC
HCT: 39.3 % (ref 35.0–47.0)
Hemoglobin: 13.8 g/dL (ref 12.0–16.0)
MCH: 32.6 pg (ref 26.0–34.0)
MCHC: 35 g/dL (ref 32.0–36.0)
MCV: 93 fL (ref 80.0–100.0)
PLATELETS: 220 10*3/uL (ref 150–440)
RBC: 4.22 MIL/uL (ref 3.80–5.20)
RDW: 14.5 % (ref 11.5–14.5)
WBC: 6.3 10*3/uL (ref 3.6–11.0)

## 2017-11-20 LAB — SALICYLATE LEVEL: Salicylate Lvl: <7 mg/dL (ref 2.8–30.0)

## 2017-11-20 LAB — COMPREHENSIVE METABOLIC PANEL
ALBUMIN: 3.4 g/dL — AB (ref 3.5–5.0)
ALK PHOS: 124 U/L (ref 38–126)
ALT: 89 U/L — ABNORMAL HIGH (ref 0–44)
AST: 163 U/L — AB (ref 15–41)
Anion gap: 7 (ref 5–15)
BILIRUBIN TOTAL: 0.5 mg/dL (ref 0.3–1.2)
BUN: 14 mg/dL (ref 6–20)
CO2: 28 mmol/L (ref 22–32)
Calcium: 8.8 mg/dL — ABNORMAL LOW (ref 8.9–10.3)
Chloride: 106 mmol/L (ref 98–111)
Creatinine, Ser: 0.59 mg/dL (ref 0.44–1.00)
GFR calc Af Amer: >60 mL/min (ref >60)
GFR calc non Af Amer: >60 mL/min (ref >60)
GLUCOSE: 84 mg/dL (ref 70–99)
POTASSIUM: 3.7 mmol/L (ref 3.5–5.1)
Sodium: 141 mmol/L (ref 135–145)
TOTAL PROTEIN: 7 g/dL (ref 6.5–8.1)

## 2017-11-20 LAB — ACETAMINOPHEN LEVEL: Acetaminophen (Tylenol), Serum: <10 ug/mL — ABNORMAL LOW (ref 10–30)

## 2017-11-20 LAB — ETHANOL: Alcohol, Ethyl (B): <10 mg/dL (ref <10)

## 2017-11-20 NOTE — ED Notes (Signed)
SOC complete.  

## 2017-11-20 NOTE — BH Assessment (Signed)
Assessment Note  [name] is an 52 y.o. female who presents to the ER due to SI. With this writer she denied having SI and acknowledged she do not like her current Group Home. Patient states she does not feel safe at the home but was unable to give a reason why she was unsafe.  She reports she can not return but Clinical research associatewriter was unable to verify this because he was unable to contact anyone at the Group Home.  Patient have a history of coming to the ER with similar presentation. She states she's going to end her life if she have to return to the Group Home. She have no past history of attempts and or gestures. Patient denies HI and V/H and reports of having A/H with commands. She denies the use of mind-altering substances and no history of aggression.  Diagnosis: Depression  Past Medical History:  Past Medical History:  Diagnosis Date  . Anxiety   . Asthma   . Borderline personality disorder (HCC)   . Cerebral palsy (HCC)   . COPD (chronic obstructive pulmonary disease) (HCC)   . Depression   . GERD (gastroesophageal reflux disease)   . Mild cognitive impairment   . Schizoaffective disorder (HCC)   . Wound abscess     Past Surgical History:  Procedure Laterality Date  . CHOLECYSTECTOMY    . INCISION AND DRAINAGE ABSCESS Right 01/02/2015   Procedure: INCISION AND DRAINAGE ABSCESS;  Surgeon: Tiney Rougealph Ely III, MD;  Location: [facility] ORS;  Service: General;  Laterality: Right;  . TONSILLECTOMY      Family History:  Family History  Problem Relation Age of Onset  . Heart attack Father   . Diabetes Mother     Social History:  reports that she has never smoked. She has never used smokeless tobacco. She reports that she does not drink alcohol or use drugs.  Additional Social History:  Alcohol / Drug Use Pain Medications: See PTA Prescriptions: See PTA Over the Counter: See PTA History of alcohol / drug use?: No history of alcohol / drug abuse Longest period of sobriety (when/how long):  n/a Negative Consequences of Use: (n/a) Withdrawal Symptoms: (n/a)  CIWA: CIWA-Ar BP: 107/65 Pulse Rate: 94 COWS:    Allergies:  Allergies  Allergen Reactions  . Penicillins Nausea And Vomiting and Other (See Comments)    Reaction:  No  Has patient had a PCN reaction causing immediate rash, facial/tongue/throat swelling, SOB or lightheadedness with hypotension: No Has patient had a PCN reaction causing severe rash involving mucus membranes or skin necrosis: No Has patient had a PCN reaction that required hospitalization No Has patient had a PCN reaction occurring within the last 10 years: No If all of the above answers are "NO", then may proceed with Cephalosporin use.     Home Medications:  (Not in a hospital admission)  OB/GYN Status:  No LMP recorded. Patient has had an implant.  General Assessment Data [facility] Assessment: In system Is this a Tele or Face-to-Face Assessment?: Face-to-Face Is this an Initial Assessment or a Re-assessment for this encounter?: Initial Assessment Marital status: Single Maiden name: Wieczorek Is patient pregnant?: No Pregnancy Status: No Living Arrangements: Group Home([facility] [phone]) Can pt return to current living arrangement?: Yes Admission Status: Voluntary Is patient capable of signing voluntary admission?: Yes Referral Source: Self/Family/Friend Insurance type: Medicare  Medical Screening Exam [facility] Walk-in ONLY) Medical Exam completed: Yes  Crisis Care Plan Living Arrangements: Group Home([facility] [phone]) Legal Guardian: Other:(Self) Name of Psychiatrist: [facility] Name of Therapist: [facility]  Education Status Is patient currently in school?: No Is the patient employed, unemployed or receiving disability?: Unemployed, Receiving disability income  Risk to self with the past 6 months Suicidal Ideation: No Has patient  been a risk to self within the past 6 months prior to admission? : No Suicidal Intent: No Has patient had any suicidal intent within the past 6 months prior to admission? : No Is patient at risk for suicide?: No Suicidal Plan?: No Has patient had any suicidal plan within the past 6 months prior to admission? : No Access to Means: No What has been your use of drugs/alcohol within the last 12 months?: Reports of none Previous Attempts/Gestures: No How many times?: 0 Other Self Harm Risks: Reports of none Triggers for Past Attempts: None known Intentional Self Injurious Behavior: None Family Suicide History: No Recent stressful life event(s): Other (Comment) Persecutory voices/beliefs?: No Depression: No Depression Symptoms: (Reports of none) Substance abuse history and/or treatment for substance abuse?: No Suicide prevention information given to non-admitted patients: Not applicable  Risk to Others within the past 6 months Homicidal Ideation: No Does patient have any lifetime risk of violence toward others beyond the six months prior to admission? : No Thoughts of Harm to Others: No Current Homicidal Intent: No Current Homicidal Plan: No Access to Homicidal Means: No Identified Victim: Reports of none History of harm to others?: No Assessment of Violence: None Noted Violent Behavior Description: [facility] Does patient have access to weapons?: No Criminal Charges Pending?: No Does patient have a court date: No Is patient on probation?: No  Psychosis Hallucinations: Auditory Delusions: None noted  Mental Status Report Appearance/Hygiene: Unremarkable, In scrubs Eye Contact: Fair Motor Activity: Freedom of movement, Unremarkable Speech: Logical/coherent, Unremarkable Level of Consciousness: Alert Mood: Anxious, Helpless, Pleasant Affect: Appropriate to circumstance, Sad Anxiety Level: Minimal Thought Processes: Coherent, Relevant Judgement:  Unimpaired Orientation: Person, Place, Time, Situation, Appropriate for developmental age Obsessive Compulsive Thoughts/Behaviors: Minimal  Cognitive Functioning Concentration: Normal Memory: Recent Intact, Remote Intact Is patient IDD: No Is patient DD?: No Insight: Fair Impulse Control: Fair Appetite: Good Have you had any weight changes? : No Change Sleep: No Change Total Hours of Sleep: 8 Vegetative Symptoms: None  ADLScreening [facility] Assessment Services) Patient's cognitive ability adequate to safely complete daily activities?: Yes Patient able to express need for assistance with ADLs?: Yes Independently performs ADLs?: Yes (appropriate for developmental age)  Prior Inpatient Therapy Prior Inpatient Therapy: Yes Prior Therapy Dates: Multiple Hospitalizations Prior Therapy Facilty/Provider(s): Multiple Hospitalizations Reason for Treatment: SI and Depression  Prior Outpatient Therapy Prior Outpatient Therapy: Yes Prior Therapy Dates: Currently Prior Therapy Facilty/Provider(s): [place]  Reason for Treatment: Depression Does patient have an ACCT team?: No Does patient have Intensive In-House Services?  : No Does patient have [facility] services? : No Does patient have P4CC services?: No  ADL Screening (condition at time of admission) Patient's cognitive ability adequate to safely complete daily activities?: Yes Is the patient deaf or have difficulty hearing?: No Does the patient have difficulty seeing, even when wearing glasses/contacts?: No Does the patient have difficulty concentrating, remembering, or making decisions?: No Patient able to express need for assistance with ADLs?: Yes Does the patient have difficulty dressing or bathing?: No Independently performs ADLs?: Yes (appropriate for developmental age) Does the patient have difficulty walking or climbing stairs?: No Weakness of Legs: None Weakness  of Arms/Hands: None  Home Assistive  Devices/Equipment Home Assistive Devices/Equipment: None  Therapy Consults (therapy consults require a physician order) PT Evaluation Needed: No OT Evalulation Needed: No SLP Evaluation Needed: No Abuse/Neglect Assessment (Assessment to be complete while patient is alone) Abuse/Neglect Assessment Can Be Completed: Yes Physical Abuse: Denies Verbal Abuse: Denies Sexual Abuse: Denies Exploitation of patient/patient's resources: Denies Self-Neglect: Denies Values / Beliefs Cultural Requests During Hospitalization: None Spiritual Requests During Hospitalization: None Consults Spiritual Care Consult Needed: No Social Work Consult Needed: No Merchant navy officer (For Healthcare) Does Patient Have a Medical Advance Directive?: No       Child/Adolescent Assessment Running Away Risk: Denies(Patient is an adult)  Disposition:  Disposition Initial Assessment Completed for this Encounter: Yes  On Site Evaluation by:   Reviewed with Physician:    Lilyan Gilford MS, LCAS, LPC, NCC, CCSI Therapeutic Triage Specialist 11/20/2017 8:13 PM

## 2017-11-20 NOTE — ED Notes (Signed)
Pt. Alert and oriented, warm and dry, in no distress. Pt. Denies SI, HI, and AVH. Currently awaiting call back from group home for discharge. Pt. Encouraged to let nursing staff know of any concerns or needs.

## 2017-11-20 NOTE — ED Notes (Signed)
Patient discharged to return to group [facility]. Director Latoya aware of patient's return. Patient currently denies SI/HI/AVH. Patient and Clinical research associatewriter reviewed discharge paperwork and patient signed discharge. Patient states verbally understanding of discharge paperwork.

## 2017-11-20 NOTE — ED Notes (Signed)
Made multiple calls to group home. Finally someone answered and stated I need to call [phone] speak with Latoya.  This Clinical research associatewriter callled multiple times with no answer.

## 2017-11-20 NOTE — ED Notes (Signed)
Group home director Latoya called this Clinical research associatewriter. Stating she has no way to pick up patient but patient can return in [place] and charge the group home.

## 2017-11-20 NOTE — BH Assessment (Signed)
Writer attempted to contact Group Home, [facility] [phone]) but was unable to reach anyone. Was unable to leave a message due to the voicemail was full.

## 2017-11-20 NOTE — ED Provider Notes (Signed)
[facility] Emergency Department Provider Note  Time seen: 5:22 PM  I have reviewed the triage vital signs and the nursing notes.   HISTORY  Chief Complaint Suicidal    HPI Vesta Wheeland is a 52 y.o. female with a past medical history of anxiety, borderline personality, cerebral palsy, COPD, cognitive impairment, schizoaffective, presents to the emergency department for group facility saying that she wanted to hurt herself.  According to the patient she got into an argument with a staff member at her group home today and told him that she was going to walk in front of traffic and kill herself.  Called 911 to bring her to the emergency department.  When asked why the patient wants to kill herself she states because she does not like her group home and the workers there.  No medical complaints at this time.  Largely negative review of systems.   Past Medical History:  Diagnosis Date  . Anxiety   . Asthma   . Borderline personality disorder (HCC)   . Cerebral palsy (HCC)   . COPD (chronic obstructive pulmonary disease) (HCC)   . Depression   . GERD (gastroesophageal reflux disease)   . Mild cognitive impairment   . Schizoaffective disorder (HCC)   . Wound abscess     Patient Active Problem List   Diagnosis Date Noted  . Severe recurrent major depression without psychotic features (HCC) 08/10/2016  . Overdose by acetaminophen 08/08/2016  . Schizoaffective disorder, bipolar type (HCC)   . 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 GERD (gastroesophageal reflux disease) 09/24/2014  . COPD (chronic obstructive pulmonary disease) (HCC) 09/24/2014  . Borderline personality disorder (HCC) 09/24/2014  . Cerebral palsy (HCC) 08/31/2014    Past Surgical History:  Procedure Laterality Date  . CHOLECYSTECTOMY    . INCISION AND DRAINAGE ABSCESS Right 01/02/2015   Procedure: INCISION AND DRAINAGE ABSCESS;  Surgeon: Tiney Rouge III, MD;  Location: [facility] ORS;   Service: General;  Laterality: Right;  . TONSILLECTOMY      Prior to Admission medications   Medication Sig Start Date End Date Taking? Authorizing Provider  amantadine (SYMMETREL) 100 MG capsule Take 1 capsule (100 mg total) by mouth 2 (two) times daily. 08/14/16   Jimmy Footman, MD  ARIPiprazole (ABILIFY) 5 MG tablet Take 5 mg by mouth daily.    [provider]  aspirin EC 81 MG tablet Take 81 mg by mouth daily.    [provider]  azithromycin (ZITHROMAX Z-PAK) 250 MG tablet Take 2 tablets (500 mg) on  Day 1,  followed by 1 tablet (250 mg) once daily on Days 2 through 5. 05/23/17   Enid Derry, PA-C  benzonatate (TESSALON PERLES) 100 MG capsule Take 1 capsule (100 mg total) by mouth 3 (three) times daily as needed for cough. 05/23/17 05/23/18  Enid Derry, PA-C  cetirizine (ZYRTEC) 10 MG tablet Take 10 mg by mouth daily.    [provider]  clonazePAM (KLONOPIN) 0.5 MG tablet Take 0.5 mg by mouth 2 (two) times daily.     [provider]  divalproex (DEPAKOTE) 500 MG DR tablet Take 500 mg by mouth 2 (two) times daily.     [provider]  escitalopram (LEXAPRO) 20 MG tablet Take 30 mg by mouth daily.     [provider]  fenofibrate 160 MG tablet Take 160 mg by mouth daily.    [provider]  Fluticasone-Salmeterol (ADVAIR) 100-50 MCG/DOSE AEPB Inhale 1 puff into the lungs 2 (  two) times daily.     [provider]  furosemide (LASIX) 20 MG tablet Take 20 mg by mouth daily.    [provider]  HYDROcodone-acetaminophen (NORCO/VICODIN) 5-325 MG tablet Take 1 tablet by mouth every 4 (four) hours as needed. Patient not taking: Reported on 05/02/2017 03/13/17   Linwood Dibbles, MD  hydrocortisone 2.5 % cream Apply 1 application topically 2 (two) times daily.    [provider]  loperamide (IMODIUM) 2 MG capsule Take 1 capsule (2 mg total) by mouth 4 (four) times daily as needed for diarrhea or loose  stools. 04/21/17   Burgess Amor, PA-C  Omega-3 Fatty Acids (FISH OIL PO) Take 1 capsule by mouth daily.    [provider]  pantoprazole (PROTONIX) 40 MG tablet Take 40 mg by mouth daily.     [provider]  potassium chloride (K-DUR) 10 MEQ tablet Take 10 mEq by mouth daily.    [provider]  sucralfate (CARAFATE) 1 g tablet Take 1 g by mouth 4 (four) times daily -  with meals and at bedtime.    [provider]    Allergies  Allergen Reactions  . Penicillins Nausea And Vomiting and Other (See Comments)    Reaction:  No  Has patient had a PCN reaction causing immediate rash, facial/tongue/throat swelling, SOB or lightheadedness with hypotension: No Has patient had a PCN reaction causing severe rash involving mucus membranes or skin necrosis: No Has patient had a PCN reaction that required hospitalization No Has patient had a PCN reaction occurring within the last 10 years: No If all of the above answers are "NO", then may proceed with Cephalosporin use.     Family History  Problem Relation Age of Onset  . Heart attack Father   . Diabetes Mother     Social History Social History   Tobacco Use  . Smoking status: Never Smoker  . Smokeless tobacco: Never Used  Substance Use Topics  . Alcohol use: No  . Drug use: No    Review of Systems Constitutional: Negative for fever. Cardiovascular: Negative for chest pain. Respiratory: Negative for shortness of breath. Gastrointestinal: Negative for abdominal pain, vomitin Genitourinary: Negative for urinary compaints Musculoskeletal: Negative for musculoskeletal complaints Skin: Negative for skin complaints  Neurological: Negative for headache All other ROS negative  ____________________________________________   PHYSICAL EXAM:  VITAL SIGNS: ED Triage Vitals  Enc Vitals Group     BP 11/20/17 1516 107/65     Pulse Rate 11/20/17 1516 94     Resp 11/20/17 1516 16     Temp 11/20/17 1516 98.7  F (37.1 C)     Temp Source 11/20/17 1516 Oral     SpO2 11/20/17 1516 96 %     Weight 11/20/17 1517 181 lb (82.1 kg)     Height 11/20/17 1517 5\' 3"  (1.6 m)     Head Circumference --      Peak Flow --      Pain Score 11/20/17 1517 10     Pain Loc --      Pain Edu? --      Excl. in GC? --     Constitutional: Patient is alert, no distress.  Answering questions appropriately. Eyes: Normal exam ENT   Head: Normocephalic and atraumatic.   Mouth/Throat: Mucous membranes are moist. Cardiovascular: Normal rate, regular rhythm. No murmur Respiratory: Normal respiratory effort without tachypnea nor retractions. Breath sounds are clear  Gastrointestinal: Soft and nontender. No distention. Musculoskeletal: Nontender with  normal range of motion in all extremities.  Neurologic:  Normal speech and language. No gross focal neurologic deficits  Skin:  Skin is warm, dry and intact.  Psychiatric: Mood and affect are normal.   ____________________________________________   INITIAL IMPRESSION / ASSESSMENT AND PLAN / ED COURSE  Pertinent labs & imaging results that were available during my care of the patient were reviewed by me and considered in my medical decision making (see chart for details).  Patient presented to the emergency department for suicidal ideation.  States she underwent argument with a staff member at her group home and told him that she was going to walk a friend traffic to kill herself.  Patient is well-known to the emergency department, has multiple visits in the past with a very similar story due to arguments at her group home.  Patient has cognitive impairment which is likely contributing to her current presentation.  We will have psychiatry see the patient.  At this time I do not believe the patient is an actual threat to herself or anyone else.  She is agreeable to stay to speak to psychiatry.  Patient's lab work is largely within normal limits, urine pending.  Patient  has been seen by psychiatry.  They believe the patient is safe for discharge home we will clarify with group home and arrange transportation via the group home once available.  ____________________________________________   FINAL CLINICAL IMPRESSION(S) / ED DIAGNOSES  Depression     Minna AntisPaduchowski, Kaydie Petsch, MD 11/20/17 2104

## 2017-11-20 NOTE — Discharge Instructions (Addendum)
You have been seen in the emergency department for a  psychiatric concern. You have been evaluated both medically as well as psychiatrically. Please follow-up with your outpatient resources provided. Return to the emergency department for any worsening symptoms, or any thoughts of hurting yourself or anyone else so that we may attempt to help you. 

## 2017-11-20 NOTE — ED Notes (Signed)
VOL/SOC called @ 1829 [facility] Pa[facility] who will place into que

## 2017-11-20 NOTE — ED Triage Notes (Signed)
To ER via [facility] from [facility] for suicidal ideation. Pt states that she wants to step out in front of car. Pt was upset when EMS arrived to patient, 89% on RA-placed on 2L prior to arrival to ER. 81 CBG with EMS. Pt reports that she wants to hurt [place] Pugh[place]unny Stone, who is International aid/development workerassistant manager at group home. Pt arrives with purse, tan sandals, cane, black shorts, white shirt, two silver colored rings, necklace, white watch.

## 2017-11-20 NOTE — ED Notes (Signed)
BEHAVIORAL HEALTH ROUNDING Patient sleeping: No. Patient alert and oriented: yes Behavior appropriate: Yes.  ; If no, describe:  Nutrition and fluids offered: yes Toileting and hygiene offered: Yes  Sitter present: q15 minute observations and security monitoring Law enforcement present: Yes    

## 2017-11-28 ENCOUNTER — Encounter: Payer: Self-pay | Admitting: Intensive Care

## 2017-11-28 ENCOUNTER — Other Ambulatory Visit: Payer: Self-pay

## 2017-11-28 ENCOUNTER — Emergency Department
Admission: EM | Admit: 2017-11-28 | Discharge: 2017-12-17 | Disposition: A | Discharge status: Home / Self Care | Payer: Medicare Other | Attending: Emergency Medicine | Admitting: Emergency Medicine

## 2017-11-28 DIAGNOSIS — K219 Gastro-esophageal reflux disease without esophagitis: Secondary | ICD-10-CM | POA: Diagnosis present

## 2017-11-28 DIAGNOSIS — R45851 Suicidal ideations: Secondary | ICD-10-CM | POA: Diagnosis not present

## 2017-11-28 DIAGNOSIS — F259 Schizoaffective disorder, unspecified: Secondary | ICD-10-CM | POA: Diagnosis not present

## 2017-11-28 DIAGNOSIS — F603 Borderline personality disorder: Secondary | ICD-10-CM | POA: Diagnosis not present

## 2017-11-28 DIAGNOSIS — F70 Mild intellectual disabilities: Secondary | ICD-10-CM | POA: Diagnosis present

## 2017-11-28 DIAGNOSIS — Z79899 Other long term (current) drug therapy: Secondary | ICD-10-CM | POA: Insufficient documentation

## 2017-11-28 DIAGNOSIS — J449 Chronic obstructive pulmonary disease, unspecified: Secondary | ICD-10-CM | POA: Diagnosis not present

## 2017-11-28 DIAGNOSIS — Z7982 Long term (current) use of aspirin: Secondary | ICD-10-CM | POA: Diagnosis not present

## 2017-11-28 DIAGNOSIS — G809 Cerebral palsy, unspecified: Secondary | ICD-10-CM | POA: Diagnosis present

## 2017-11-28 LAB — URINE DRUG SCREEN, QUALITATIVE (ARMC ONLY)
AMPHETAMINES, UR SCREEN: NOT DETECTED
Barbiturates, Ur Screen: NOT DETECTED
Cannabinoid 50 Ng, Ur ~~LOC~~: NOT DETECTED
Cocaine Metabolite,Ur ~~LOC~~: NOT DETECTED
MDMA (ECSTASY) UR SCREEN: NOT DETECTED
METHADONE SCREEN, URINE: NOT DETECTED
Opiate, Ur Screen: NOT DETECTED
Phencyclidine (PCP) Ur S: NOT DETECTED
Tricyclic, Ur Screen: NOT DETECTED

## 2017-11-28 LAB — COMPREHENSIVE METABOLIC PANEL
ALBUMIN: 3.5 g/dL (ref 3.5–5.0)
ALT: 32 U/L (ref 0–44)
ANION GAP: 6 (ref 5–15)
AST: 27 U/L (ref 15–41)
Alkaline Phosphatase: 110 U/L (ref 38–126)
BILIRUBIN TOTAL: 0.6 mg/dL (ref 0.3–1.2)
BUN: 20 mg/dL (ref 6–20)
CHLORIDE: 106 mmol/L (ref 98–111)
CO2: 30 mmol/L (ref 22–32)
Calcium: 8.9 mg/dL (ref 8.9–10.3)
Creatinine, Ser: 0.53 mg/dL (ref 0.44–1.00)
GFR calc Af Amer: >60 mL/min (ref >60)
GFR calc non Af Amer: >60 mL/min (ref >60)
Glucose, Bld: 90 mg/dL (ref 70–99)
POTASSIUM: 3.7 mmol/L (ref 3.5–5.1)
Sodium: 142 mmol/L (ref 135–145)
TOTAL PROTEIN: 7 g/dL (ref 6.5–8.1)

## 2017-11-28 LAB — ETHANOL: Alcohol, Ethyl (B): <10 mg/dL (ref <10)

## 2017-11-28 LAB — CBC
HEMATOCRIT: 38.7 % (ref 35.0–47.0)
Hemoglobin: 13.3 g/dL (ref 12.0–16.0)
MCH: 32.3 pg (ref 26.0–34.0)
MCHC: 34.4 g/dL (ref 32.0–36.0)
MCV: 94 fL (ref 80.0–100.0)
Platelets: 257 10*3/uL (ref 150–440)
RBC: 4.12 MIL/uL (ref 3.80–5.20)
RDW: 14.4 % (ref 11.5–14.5)
WBC: 6.8 10*3/uL (ref 3.6–11.0)

## 2017-11-28 LAB — ACETAMINOPHEN LEVEL

## 2017-11-28 LAB — SALICYLATE LEVEL: Salicylate Lvl: <7 mg/dL (ref 2.8–30.0)

## 2017-11-28 NOTE — BH Assessment (Signed)
Writer called pt's group home owner Glee Arvin( [facility] at [phone]) and received no answer. Writer called the after hours number at [phone] left on voicemail. Learned from [place] that pt was discharged today from group home and cannot return back to facility. Informed that pt has a cardinal coordinator Gari Crown(Ashley Sanders) at [phone]

## 2017-11-28 NOTE — Discharge Instructions (Signed)
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
## 2017-11-28 NOTE — ED Provider Notes (Signed)
[facility] Emergency Department Provider Note   ____________________________________________   First MD Initiated Contact with Patient 11/28/17 2010     (approximate)  I have reviewed the triage vital signs and the nursing notes.   HISTORY  Chief Complaint Suicidal    HPI [name] is a 52 y.o. female history of borderline personality disorder schizoaffective disorder  Patient reports she is here because she wants to kill the people she lives with and herself.  She reports that people is still on Monday, she does not like the people she lives with, she feels like she needs to kill people.  She reports she does not really have a plan.  She knows the people who she wants to kill.  Denies any other complaint.  Reports she does not want to go back to the group home she is at.  Denies taking any action or harm herself.  Reports taking medications as prescribed.    Past Medical History:  Diagnosis Date  . Anxiety   . Asthma   . Borderline personality disorder (HCC)   . Cerebral palsy (HCC)   . COPD (chronic obstructive pulmonary disease) (HCC)   . Depression   . GERD (gastroesophageal reflux disease)   . Mild cognitive impairment   . Schizoaffective disorder (HCC)   . Wound abscess     Patient Active Problem List   Diagnosis Date Noted  . Severe recurrent major depression without psychotic features (HCC) 08/10/2016  . Overdose by acetaminophen 08/08/2016  . Schizoaffective disorder, bipolar type (HCC)   . 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 GERD (gastroesophageal reflux disease) 09/24/2014  . COPD (chronic obstructive pulmonary disease) (HCC) 09/24/2014  . Borderline personality disorder (HCC) 09/24/2014  . Cerebral palsy (HCC) 08/31/2014    Past Surgical History:  Procedure Laterality Date  . CHOLECYSTECTOMY    . INCISION AND DRAINAGE ABSCESS Right 01/02/2015   Procedure: INCISION AND DRAINAGE ABSCESS;  Surgeon: Tiney Rougealph Ely  III, MD;  Location: [facility] ORS;  Service: General;  Laterality: Right;  . TONSILLECTOMY      Prior to Admission medications   Medication Sig Start Date End Date Taking? Authorizing Provider  alum & mag hydroxide-simeth (MAALOX/MYLANTA) 200-200-20 MG/5ML suspension Take 30 mLs by mouth every 8 (eight) hours as needed for indigestion or heartburn.   Yes [provider]  aspirin EC 81 MG tablet Take 81 mg by mouth daily.   Yes [provider]  cetirizine (ZYRTEC) 10 MG tablet Take 10 mg by mouth daily.   Yes [provider]  clonazePAM (KLONOPIN) 0.5 MG tablet Take 0.5 mg by mouth 2 (two) times daily.    Yes [provider]  divalproex (DEPAKOTE) 500 MG DR tablet Take 500 mg by mouth 2 (two) times daily.    Yes [provider]  escitalopram (LEXAPRO) 20 MG tablet Take 20 mg by mouth daily.    Yes [provider]  Fluticasone-Salmeterol (ADVAIR) 250-50 MCG/DOSE AEPB Inhale 1 puff into the lungs 2 (two) times daily.    Yes [provider]  folic acid (FOLVITE) 1 MG tablet Take 1 mg by mouth daily.   Yes [provider]  levothyroxine (SYNTHROID, LEVOTHROID) 50 MCG tablet Take 50 mcg by mouth daily before breakfast.   Yes [provider]  Menthol-Methyl Salicylate (MUSCLE RUB) 10-15 % CREA Apply 1 application topically every 6 (six) hours as needed for muscle pain.   Yes [provider]  nitrofurantoin, macrocrystal-monohydrate, (MACROBID) 100 MG capsule Take  100 mg by mouth 2 (two) times daily.   Yes [provider]  oxybutynin (DITROPAN-XL) 5 MG 24 hr tablet Take 5 mg by mouth daily.   Yes [provider]  polyethylene glycol (MIRALAX / GLYCOLAX) packet Take 17 g by mouth daily as needed.   Yes [provider]  risperiDONE (RISPERDAL) 3 MG tablet Take 3 mg by mouth 2 (two) times daily.   Yes [provider]  vitamin B-12 (CYANOCOBALAMIN) 1000 MCG tablet Take 1,000 mcg by mouth  daily.   Yes [provider]  amantadine (SYMMETREL) 100 MG capsule Take 1 capsule (100 mg total) by mouth 2 (two) times daily. Patient not taking: Reported on 11/28/2017 08/14/16   Jimmy Footman, MD  ARIPiprazole (ABILIFY) 5 MG tablet Take 5 mg by mouth daily.    [provider]  azithromycin (ZITHROMAX Z-PAK) 250 MG tablet Take 2 tablets (500 mg) on  Day 1,  followed by 1 tablet (250 mg) once daily on Days 2 through 5. Patient not taking: Reported on 11/28/2017 05/23/17   Enid Derry, PA-C  benzonatate (TESSALON PERLES) 100 MG capsule Take 1 capsule (100 mg total) by mouth 3 (three) times daily as needed for cough. Patient not taking: Reported on 11/28/2017 05/23/17 05/23/18  Enid Derry, PA-C  fenofibrate 160 MG tablet Take 160 mg by mouth daily.    [provider]  furosemide (LASIX) 20 MG tablet Take 20 mg by mouth daily.    [provider]  HYDROcodone-acetaminophen (NORCO/VICODIN) 5-325 MG tablet Take 1 tablet by mouth every 4 (four) hours as needed. Patient not taking: Reported on 05/02/2017 03/13/17   Linwood Dibbles, MD  hydrocortisone 2.5 % cream Apply 1 application topically 2 (two) times daily.    [provider]  loperamide (IMODIUM) 2 MG capsule Take 1 capsule (2 mg total) by mouth 4 (four) times daily as needed for diarrhea or loose stools. Patient not taking: Reported on 11/28/2017 04/21/17   Burgess Amor, PA-C  Omega-3 Fatty Acids (FISH OIL PO) Take 1 capsule by mouth daily.    [provider]  pantoprazole (PROTONIX) 40 MG tablet Take 40 mg by mouth daily.     [provider]  potassium chloride (K-DUR) 10 MEQ tablet Take 10 mEq by mouth daily.    [provider]  sucralfate (CARAFATE) 1 g tablet Take 1 g by mouth 4 (four) times daily -  with meals and at bedtime.    [provider]    Allergies Penicillins  Family History  Problem Relation Age of Onset  . Heart attack Father   . Diabetes  Mother     Social History Social History   Tobacco Use  . Smoking status: Never Smoker  . Smokeless tobacco: Never Used  Substance Use Topics  . Alcohol use: No  . Drug use: No    Review of Systems Constitutional: No fever/chills ENT: No sore throat. Cardiovascular: Denies chest pain. Respiratory: Denies shortness of breath. Skin: Negative for rash. Neurological: Negative for headaches or weakness.    ____________________________________________   PHYSICAL EXAM:  VITAL SIGNS: ED Triage Vitals  Enc Vitals Group     BP 11/28/17 1809 103/79     Pulse Rate 11/28/17 1809 77     Resp 11/28/17 1809 16     Temp 11/28/17 1809 98.2 F (36.8 C)     Temp Source 11/28/17 1809 Oral     SpO2 11/28/17 1809 96 %     Weight 11/28/17 1810  181 lb (82.1 kg)     Height 11/28/17 1810 5\' 3"  (1.6 m)     Head Circumference --      Peak Flow --      Pain Score 11/28/17 1810 5     Pain Loc --      Pain Edu? --      Excl. in GC? --     Constitutional: Alert and oriented.  Flat in affect.  Sitting on side of bed watching TV. Eyes: Conjunctivae are normal. Head: Atraumatic. Nose: No congestion/rhinnorhea. Mouth/Throat: Mucous membranes are moist. Neck: No stridor.   Cardiovascular: Normal rate, regular rhythm. Grossly normal heart sounds.  Good peripheral circulation. Respiratory: Normal respiratory effort.  No retractions. Lungs CTAB. Neurologic:  Normal speech and language. No gross focal neurologic deficits are appreciated.  Skin:  Skin is warm, dry and intact. No rash noted. Psychiatric: Mood and affect are flat.  Endorses suicidal ideation.  Also endorses homicidal ideation. ____________________________________________   LABS (all labs ordered are listed, but only abnormal results are displayed)  Labs Reviewed  ACETAMINOPHEN LEVEL - Abnormal; Notable for the following components:      Result Value   Acetaminophen (Tylenol), Serum <10 (*)    All other components within  normal limits  URINE DRUG SCREEN, QUALITATIVE ([facility] ONLY) - Abnormal; Notable for the following components:   Benzodiazepine, Ur Scrn TEST NOT PERFORMED, REAGENT NOT AVAILABLE (*)    All other components within normal limits  COMPREHENSIVE METABOLIC PANEL  ETHANOL  SALICYLATE LEVEL  CBC  POC URINE PREG, ED   ____________________________________________  EKG   ____________________________________________  RADIOLOGY   ____________________________________________   PROCEDURES  Procedure(s) performed: None  Procedures  Critical Care performed: No  ____________________________________________   INITIAL IMPRESSION / ASSESSMENT AND PLAN / ED COURSE  Pertinent labs & imaging results that were available during my care of the patient were reviewed by me and considered in my medical decision making (see chart for details).  This is a patient presenting who is now becoming well-known to our hospital and psychiatric service.  She is presenting today with complaints of suicidal and homicidal ideation which appear to be somewhat of a very frequent complaint.  She does not appear to have any recent history of any suicide attempt, does not have a clear plan for self injury or plan to harm others other than indistinctly reporting at triage that she was going to use a knife to stab herself, but again she said things like this previous without taking any action.  I suspect rather that she likely does not enjoy her group home setting, reacts out by saying things such as suicidal ideation etc.  She is medically cleared, further disposition planning per Dr. Toni Amendlapacs.  Dr. Toni Amendlapacs psychiatry advised that the patient may be discharged once a appropriate disposition has been arrived at, possibly returning to her group home.  Ongoing care with plan to follow-up on disposition assigned to Dr. Derrill KayGoodman.  Patient not under IVC.      ____________________________________________   FINAL CLINICAL  IMPRESSION(S) / ED DIAGNOSES  Final diagnoses:  Borderline personality disorder (HCC)      NEW MEDICATIONS STARTED DURING THIS VISIT:  New Prescriptions   No medications on file     Note:  This document was prepared using Dragon voice recognition software and may include unintentional dictation errors.     Sharyn CreamerQuale, Aneudy Champlain, MD 11/28/17 2356

## 2017-11-28 NOTE — ED Notes (Signed)
Pt given a sandwich tray.  Asking for a second.  Pt was told she needed to wait to make sure we had enough for 2 trays

## 2017-11-28 NOTE — ED Notes (Signed)
Patient asking for dinner at this time

## 2017-11-28 NOTE — ED Notes (Signed)
Pt is sitting on her bed eating a sandwich. Pt states she wants to kill two of the people that are at her group home and kill herself. The plan is to stab them both with a knife and then stab herself in the chest. Pt does not have a knife in her possession at home but states she can get one in the kitchen. Pt appears at her baseline at this time. Pt is calm and not aggressive with the staff here.

## 2017-11-29 DIAGNOSIS — F603 Borderline personality disorder: Secondary | ICD-10-CM | POA: Diagnosis not present

## 2017-11-29 NOTE — ED Notes (Signed)
When Vassar Collegearolyn MHT introduced herself to the [name], the patient responded by saying, "I'm homicidal. And suicidal." Sherri BradfordKimberly endorsed same when this Clinical research associatewriter spoke with her. Sherri BradfordKimberly requested extra assistance when dressing after taking a shower, although with encouragement she was able to achieve the tasks by herself. She has been calm and cooperative. She remains safe with checks, rounds, and camera monitoring in place.

## 2017-11-29 NOTE — Consult Note (Signed)
[facility] Consult   Reason for Consult: Follow-up for this 52 year old woman with developmental disability personality disorder and behavior problems still in the emergency room Referring Physician: Jimmye Norman [name] MRN:  [id number] Principal Diagnosis: Borderline personality disorder [facility]) Diagnosis:   Patient Active Problem List   Diagnosis Date Noted  . Severe recurrent major depression without psychotic features ([place]) [F33.2] 08/10/2016  . Overdose by acetaminophen [T39.1X1A] 08/08/2016  . Schizoaffective disorder, bipolar type ([place]) [F25.0]   . 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 GERD (gastroesophageal reflux disease) [K21.9] 09/24/2014  . COPD (chronic obstructive pulmonary disease) ([place]) [J44.9] 09/24/2014  . Borderline personality disorder ([place]) [F60.3] 09/24/2014  . Cerebral palsy ([place]) [G80.9] 08/31/2014    Total Time spent with patient: 20 minutes  Subjective:   [name] is a 52 y.o. female patient admitted with "I am still suicidal and homicidal".  HPI: Patient seen chart reviewed.  Reviewed the excellent work by social work.  Patient continues to tell staff people that she is "suicidal and homicidal" but does so in a dips passionate manner and has no behavior to suggest that she wants to harm herself and has not been aggressive towards anyone else.  Will endorse having hallucinations but does not appear to be responding to internal stimuli.  As usual she is somewhat simple in her thinking and is focused primarily just on getting away from her most recent group home.  Past Psychiatric History: Multiple prior notes.  Chronic presentation of behavior problems and resistance to placement  Risk to Self:   Risk to Others:   Prior Inpatient Therapy:   Prior Outpatient Therapy:    Past Medical History:  Past Medical History:  Diagnosis Date  . Anxiety   . Asthma   . Borderline personality disorder ([place])    . Cerebral palsy ([place])   . COPD (chronic obstructive pulmonary disease) ([place])   . Depression   . GERD (gastroesophageal reflux disease)   . Mild cognitive impairment   . Schizoaffective disorder ([place])   . Wound abscess     Past Surgical History:  Procedure Laterality Date  . CHOLECYSTECTOMY    . INCISION AND DRAINAGE ABSCESS Right 01/02/2015   Procedure: INCISION AND DRAINAGE ABSCESS;  Surgeon: Dia Crawford III, MD;  Location: [facility] ORS;  Service: General;  Laterality: Right;  . TONSILLECTOMY     Family History:  Family History  Problem Relation Age of Onset  . Heart attack Father   . Diabetes Mother    Family Psychiatric  History: Unknown Social History:  Social History   Substance and Sexual Activity  Alcohol Use No     Social History   Substance and Sexual Activity  Drug Use No    Social History   Socioeconomic History  . Marital status: Single    Spouse name: Not on file  . Number of children: Not on file  . Years of education: Not on file  . Highest education level: Not on file  Occupational History  . Not on file  Social Needs  . Financial resource strain: Not on file  . Food insecurity:    Worry: Not on file    Inability: Not on file  . Transportation needs:    Medical: Not on file    Non-medical: Not on file  Tobacco Use  . Smoking status: Never Smoker  . Smokeless tobacco: Never Used  Substance and Sexual Activity  . Alcohol use: No  . Drug use: No  .  Sexual activity: Not Currently    Birth control/protection: Implant  Lifestyle  . Physical activity:    Days per week: Not on file    Minutes per session: Not on file  . Stress: Not on file  Relationships  . Social connections:    Talks on phone: Not on file    Gets together: Not on file    Attends religious service: Not on file    Active member of club or organization: Not on file    Attends meetings of clubs or organizations: Not on file    Relationship status: Not on file  Other Topics  Concern  . Not on file  Social History Narrative  . Not on file   Additional Social History:    Allergies:   Allergies  Allergen Reactions  . Penicillins Nausea And Vomiting and Other (See Comments)    Reaction:  No  Has patient had a PCN reaction causing immediate rash, facial/tongue/throat swelling, SOB or lightheadedness with hypotension: No Has patient had a PCN reaction causing severe rash involving mucus membranes or skin necrosis: No Has patient had a PCN reaction that required hospitalization No Has patient had a PCN reaction occurring within the last 10 years: No If all of the above answers are "NO", then may proceed with Cephalosporin use.     Labs:  Results for orders placed or performed during the hospital encounter of 11/28/17 (from the past 48 hour(s))  Comprehensive metabolic panel     Status: None   Collection Time: 11/28/17  6:17 PM  Result Value Ref Range   Sodium 142 135 - 145 mmol/L   Potassium 3.7 3.5 - 5.1 mmol/L   Chloride 106 98 - 111 mmol/L   CO2 30 22 - 32 mmol/L   Glucose, Bld 90 70 - 99 mg/dL   BUN 20 6 - 20 mg/dL   Creatinine, Ser 0.53 0.44 - 1.00 mg/dL   Calcium 8.9 8.9 - 10.3 mg/dL   Total Protein 7.0 6.5 - 8.1 g/dL   Albumin 3.5 3.5 - 5.0 g/dL   AST 27 15 - 41 U/L   ALT 32 0 - 44 U/L   Alkaline Phosphatase 110 38 - 126 U/L   Total Bilirubin 0.6 0.3 - 1.2 mg/dL   GFR calc non Af Amer >60 >60 mL/min   GFR calc Af Amer >60 >60 mL/min    Comment: (NOTE) The eGFR has been calculated using the CKD EPI equation. This calculation has not been validated in all clinical situations. eGFR's persistently <60 mL/min signify possible Chronic Kidney Disease.    Anion gap 6 5 - 15    Comment: Performed at [facility], [address] [place], [place] [address]  Ethanol     Status: None   Collection Time: 11/28/17  6:17 PM  Result Value Ref Range   Alcohol, Ethyl (B) <10 <10 mg/dL    Comment: (NOTE) Lowest detectable limit for serum  alcohol is 10 mg/dL. For medical purposes only. Performed at [facility], [address] [place], [place] [address]   Salicylate level     Status: None   Collection Time: 11/28/17  6:17 PM  Result Value Ref Range   Salicylate Lvl <5.8 2.8 - 30.0 mg/dL    Comment: Performed at [facility], [address] [place], [place] [address]  Acetaminophen level     Status: Abnormal   Collection Time: 11/28/17  6:17 PM  Result Value Ref Range   Acetaminophen (Tylenol),  Serum <10 (L) 10 - 30 ug/mL    Comment: (NOTE) Therapeutic concentrations vary significantly. A range of 10-30 ug/mL  may be an effective concentration for many patients. However, some  are best treated at concentrations outside of this range. Acetaminophen concentrations >150 ug/mL at 4 hours after ingestion  and >50 ug/mL at 12 hours after ingestion are often associated with  toxic reactions. Performed at [facility], [address] [place], [place] [address]   cbc     Status: None   Collection Time: 11/28/17  6:17 PM  Result Value Ref Range   WBC 6.8 3.6 - 11.0 K/uL   RBC 4.12 3.80 - 5.20 MIL/uL   Hemoglobin 13.3 12.0 - 16.0 g/dL   HCT 38.7 35.0 - 47.0 %   MCV 94.0 80.0 - 100.0 fL   MCH 32.3 26.0 - 34.0 pg   MCHC 34.4 32.0 - 36.0 g/dL   RDW 14.4 11.5 - 14.5 %   Platelets 257 150 - 440 K/uL    Comment: Performed at [facility], [address] [place], [place] [address]  Urine Drug Screen, Qualitative     Status: Abnormal   Collection Time: 11/28/17  6:17 PM  Result Value Ref Range   Tricyclic, Ur Screen NONE DETECTED NONE DETECTED   Amphetamines, Ur Screen NONE DETECTED NONE DETECTED   MDMA (Ecstasy)Ur Screen NONE DETECTED NONE DETECTED   Cocaine Metabolite,Ur [place] NONE DETECTED NONE DETECTED   Opiate, Ur Screen NONE DETECTED NONE DETECTED   Phencyclidine (PCP) Ur S NONE DETECTED NONE DETECTED   Cannabinoid 50 Ng, Ur [place] NONE DETECTED NONE DETECTED   Barbiturates, Ur  Screen NONE DETECTED NONE DETECTED   Benzodiazepine, Ur Scrn TEST NOT PERFORMED, REAGENT NOT AVAILABLE (A) NONE DETECTED   Methadone Scn, Ur NONE DETECTED NONE DETECTED    Comment: (NOTE) Tricyclics + metabolites, urine    Cutoff 1000 ng/mL Amphetamines + metabolites, urine  Cutoff 1000 ng/mL MDMA (Ecstasy), urine              Cutoff 500 ng/mL Cocaine Metabolite, urine          Cutoff 300 ng/mL Opiate + metabolites, urine        Cutoff 300 ng/mL Phencyclidine (PCP), urine         Cutoff 25 ng/mL Cannabinoid, urine                 Cutoff 50 ng/mL Barbiturates + metabolites, urine  Cutoff 200 ng/mL Benzodiazepine, urine              Cutoff 200 ng/mL Methadone, urine                   Cutoff 300 ng/mL The urine drug screen provides only a preliminary, unconfirmed analytical test result and should not be used for non-medical purposes. Clinical consideration and professional judgment should be applied to any positive drug screen result due to possible interfering substances. A more specific alternate chemical method must be used in order to obtain a confirmed analytical result. Gas chromatography / mass spectrometry (GC/MS) is the preferred confirmat ory method. Performed at [facility], [address] [place], [place] [address]     No current facility-administered medications for this encounter.    Current Outpatient Medications  Medication Sig Dispense Refill  . alum & mag hydroxide-simeth (MAALOX/MYLANTA) 200-200-20 MG/5ML suspension Take 30 mLs by mouth every 8 (eight) hours as needed for indigestion or heartburn.    Marland Kitchen aspirin EC  81 MG tablet Take 81 mg by mouth daily.    . cetirizine (ZYRTEC) 10 MG tablet Take 10 mg by mouth daily.    . clonazePAM (KLONOPIN) 0.5 MG tablet Take 0.5 mg by mouth 2 (two) times daily.     . divalproex (DEPAKOTE) 500 MG DR tablet Take 500 mg by mouth 2 (two) times daily.     Marland Kitchen escitalopram (LEXAPRO) 20 MG tablet Take 20 mg by mouth daily.      . Fluticasone-Salmeterol (ADVAIR) 250-50 MCG/DOSE AEPB Inhale 1 puff into the lungs 2 (two) times daily.     . folic acid (FOLVITE) 1 MG tablet Take 1 mg by mouth daily.    Marland Kitchen levothyroxine (SYNTHROID, LEVOTHROID) 50 MCG tablet Take 50 mcg by mouth daily before breakfast.    . Menthol-Methyl Salicylate (MUSCLE RUB) 10-15 % CREA Apply 1 application topically every 6 (six) hours as needed for muscle pain.    . nitrofurantoin, macrocrystal-monohydrate, (MACROBID) 100 MG capsule Take 100 mg by mouth 2 (two) times daily.    Marland Kitchen oxybutynin (DITROPAN-XL) 5 MG 24 hr tablet Take 5 mg by mouth daily.    . polyethylene glycol (MIRALAX / GLYCOLAX) packet Take 17 g by mouth daily as needed.    . risperiDONE (RISPERDAL) 3 MG tablet Take 3 mg by mouth 2 (two) times daily.    . vitamin B-12 (CYANOCOBALAMIN) 1000 MCG tablet Take 1,000 mcg by mouth daily.    Marland Kitchen amantadine (SYMMETREL) 100 MG capsule Take 1 capsule (100 mg total) by mouth 2 (two) times daily. (Patient not taking: Reported on 11/28/2017) 60 capsule 0  . ARIPiprazole (ABILIFY) 5 MG tablet Take 5 mg by mouth daily.    Marland Kitchen azithromycin (ZITHROMAX Z-PAK) 250 MG tablet Take 2 tablets (500 mg) on  Day 1,  followed by 1 tablet (250 mg) once daily on Days 2 through 5. (Patient not taking: Reported on 11/28/2017) 6 each 0  . benzonatate (TESSALON PERLES) 100 MG capsule Take 1 capsule (100 mg total) by mouth 3 (three) times daily as needed for cough. (Patient not taking: Reported on 11/28/2017) 30 capsule 0  . fenofibrate 160 MG tablet Take 160 mg by mouth daily.    . furosemide (LASIX) 20 MG tablet Take 20 mg by mouth daily.    Marland Kitchen HYDROcodone-acetaminophen (NORCO/VICODIN) 5-325 MG tablet Take 1 tablet by mouth every 4 (four) hours as needed. (Patient not taking: Reported on 05/02/2017) 12 tablet 0  . hydrocortisone 2.5 % cream Apply 1 application topically 2 (two) times daily.    Marland Kitchen loperamide (IMODIUM) 2 MG capsule Take 1 capsule (2 mg total) by mouth 4 (four) times  daily as needed for diarrhea or loose stools. (Patient not taking: Reported on 11/28/2017) 12 capsule 0  . Omega-3 Fatty Acids (FISH OIL PO) Take 1 capsule by mouth daily.    . pantoprazole (PROTONIX) 40 MG tablet Take 40 mg by mouth daily.     . potassium chloride (K-DUR) 10 MEQ tablet Take 10 mEq by mouth daily.    . sucralfate (CARAFATE) 1 g tablet Take 1 g by mouth 4 (four) times daily -  with meals and at bedtime.      Musculoskeletal: Strength & Muscle Tone: within normal limits Gait & Station: normal Patient leans: N/A  Psychiatric Specialty Exam: Physical Exam  Nursing note and vitals reviewed. Constitutional: She appears well-developed and well-nourished.  HENT:  Head: Normocephalic and atraumatic.  Eyes: Pupils are equal, round, and reactive to light. Conjunctivae are  normal.  Neck: Normal range of motion.  Cardiovascular: Regular rhythm and normal heart sounds.  Respiratory: Effort normal. No respiratory distress.  GI: Soft.  Musculoskeletal: Normal range of motion.  Neurological: She is alert.  Skin: Skin is warm and dry.  Psychiatric: Her affect is blunt. Her speech is delayed and tangential. She is slowed. Thought content is not paranoid. Cognition and memory are impaired. She expresses impulsivity. She expresses no homicidal and no suicidal ideation.    Review of Systems  Constitutional: Negative.   HENT: Negative.   Eyes: Negative.   Respiratory: Negative.   Cardiovascular: Negative.   Gastrointestinal: Negative.   Musculoskeletal: Negative.   Skin: Negative.   Neurological: Negative.   Psychiatric/Behavioral: Positive for depression, hallucinations and suicidal ideas. Negative for memory loss and substance abuse. The patient is not nervous/anxious and does not have insomnia.     Blood pressure 100/65, pulse 69, temperature 97.6 F (36.4 C), temperature source Oral, resp. rate 18, height '5\' 3"'  (1.6 m), weight 82.1 kg, SpO2 98 %.Body mass index is 32.06 kg/m.   General Appearance: Casual  Eye Contact:  Fair  Speech:  Slow  Volume:  Decreased  Mood:  Dysphoric  Affect:  Constricted  Thought Process:  Disorganized  Orientation:  Full (Time, Place, and Person)  Thought Content:  Illogical, Rumination and Tangential  Suicidal Thoughts:  Yes.  without intent/plan  Homicidal Thoughts:  Yes.  without intent/plan  Memory:  Immediate;   Fair Recent;   Poor Remote;   Poor  Judgement:  Poor  Insight:  Lacking  Psychomotor Activity:  Decreased  Concentration:  Concentration: Poor  Recall:  Poor  Fund of Knowledge:  Poor  Language:  Poor  Akathisia:  No  Handed:  Right  AIMS (if indicated):     Assets:  Desire for Improvement Resilience  ADL's:  Intact  Cognition:  Impaired,  Mild  Sleep:        Treatment Plan Summary: Plan Patient with intellectual disability and behavior problems.  See the excellent note from social worker today.  The most recent group home is apparently within their rights to refuse to take her back.  [facility] is apparently willing to do some work on finding alternative placement but it looks like it is going to be a long process.  Patient still does not meet criteria nor is she likely to benefit from inpatient hospitalization.  Continue emergency room management while looking for appropriate placement.  Disposition: No evidence of imminent risk to self or others at present.   Patient does not meet criteria for psychiatric inpatient admission. Supportive therapy provided about ongoing stressors.  Alethia Berthold, MD 11/29/2017 6:43 PM

## 2017-11-29 NOTE — ED Notes (Signed)
Vol - Pending Psych Consult, Social Work Consult and TTS Consult. 

## 2017-11-29 NOTE — ED Notes (Signed)
Hourly rounding reveals patient sleeping in room. No complaints, stable, in no acute distress. Q15 minute rounds and monitoring via Security Cameras to continue. 

## 2017-11-29 NOTE — ED Notes (Signed)
Pt. Transferred to BHU from ED to room 4 after screening for contraband. Report to include Situation, Background, Assessment and Recommendations from [facility]. Pt. Oriented to unit including Q15 minute rounds as well as the security cameras for their protection. Patient is alert and oriented, warm and dry in no acute distress. Patient states she has SI, HI, and AVH. Pt. states she wants to walk in front of a car and [facility], states also hearing voices telling her to hurt herself and seeing people as well  Pt. Encouraged to let me know if needs arise.

## 2017-11-29 NOTE — ED Notes (Signed)
Pt. Is currently sleeping in bed at this time.

## 2017-11-29 NOTE — Clinical Social Work Note (Signed)
CSW received a social work consult for patient. CSW reviewed chart. Patient has been psychiatrically cleared for discharge. However, per TTS note, [facility], but group home discharged her and patient cannot return back. CSW attempted to call group home owner Wynelle BourgeoisLatoya Murphy to verify information [phone]), but no answer.  CSW spoke with [facility] Coord[facility] Larena Soxshley Saunders [phone]), who stated she [facility] on [facility] 30 day [facility] (30 days not complete yet), which allowed for immediate discharge. Ms. Lelon PerlaSaunders stated [facility] is apparently "no longer a licensed facility." Ms. Lelon PerlaSaunders stated patient is her own guardian and Adult Protective Services (APS) [facility] patient [facility] in response to a report made recently. Ms. Lelon PerlaSaunders stated [facility]. MS. Lelon PerlaSaunders stated patient has been assessed by APS in another county previously for possible guardianship, but was found to have capacity to make her own decisions, despite her medical and mental health (DD) conditions. CSW and Ms. Lelon PerlaSaunders discussed patient is not appropriate for a shelter and will be a "difficult to place" patient. Ms. Lelon PerlaSaunders is willing to assist with finding placement. CSW and Ms. Lelon PerlaSaunders to split [facility] list, with CSW contacting homes A[facility], and Ms. Saunders contacting homes [facility]. CSW and Ms. Lelon PerlaSaunders also discussed why patient has not received additional funding to be placed in a higher level of care. Ms. Lelon PerlaSaunders states she will start the process to request higher level of care funding for patient, but states "this is a process and will take some time." [facility] while simultaneously pursuing higher level of care funding. Ms. Lelon PerlaSaunders also mentioned that patient has a brother,  Sherral HammersKeith Cogle, that may be willing to allow patient to stay with him, for a short period of time, when there is a definite end time to move to facility. Lastly, Maurilio LovelyErica Crouse [phone]) from [facility] will be visiting patient on Monday 8/19 for Ms. Lelon PerlaSaunders. CSW continuing to follow for discharge needs.   Corlis HoveJeneya Alisyn Lequire, Theresia MajorsLCSWA, [facility] Clinical Social Worker-Emergency Department [phone]

## 2017-11-29 NOTE — ED Notes (Signed)
Report received from previous shift. Pt is resting quietly in room, with eyes closed and regular, even, and unlabored respirations. Will continue to monitor for needs and safety.

## 2017-11-30 DIAGNOSIS — F259 Schizoaffective disorder, unspecified: Secondary | ICD-10-CM | POA: Diagnosis not present

## 2017-11-30 MED ORDER — DIVALPROEX SODIUM 500 MG PO DR TAB: 500.0000 mg | DELAYED_RELEASE_TABLET | Freq: Two times a day (BID) | ORAL | Status: DC

## 2017-11-30 MED ORDER — VITAMIN B-12 1000 MCG PO TABS
1000.0000 ug | ORAL_TABLET | Freq: Every day | ORAL | Status: DC
  Administered 2017-11-30 – 2017-12-17 (×18): 1000 ug via ORAL
  Filled 2017-11-30 (×19): qty 1

## 2017-11-30 MED ORDER — CLONAZEPAM 0.5 MG PO TABS
0.5000 mg | ORAL_TABLET | Freq: Two times a day (BID) | ORAL | Status: DC
  Administered 2017-11-30 – 2017-12-17 (×35): 0.5 mg via ORAL
  Filled 2017-11-30 (×35): qty 1

## 2017-11-30 MED ORDER — RISPERIDONE 3 MG PO TABS
3.0000 mg | ORAL_TABLET | Freq: Two times a day (BID) | ORAL | Status: DC
  Administered 2017-11-30 – 2017-12-17 (×35): 3 mg via ORAL
  Filled 2017-11-30 (×35): qty 1

## 2017-11-30 MED ORDER — OXYBUTYNIN CHLORIDE ER 5 MG PO TB24
5.0000 mg | ORAL_TABLET | Freq: Every day | ORAL | Status: DC
  Administered 2017-11-30 – 2017-12-17 (×18): 5 mg via ORAL
  Filled 2017-11-30 (×21): qty 1

## 2017-11-30 MED ORDER — FOLIC ACID 1 MG PO TABS
1.0000 mg | ORAL_TABLET | Freq: Every day | ORAL | Status: DC
  Administered 2017-11-30 – 2017-12-17 (×18): 1 mg via ORAL
  Filled 2017-11-30 (×18): qty 1

## 2017-11-30 MED ORDER — DIVALPROEX SODIUM 500 MG PO DR TAB
500.0000 mg | DELAYED_RELEASE_TABLET | Freq: Two times a day (BID) | ORAL | Status: DC
  Administered 2017-11-30 – 2017-12-17 (×35): 500 mg via ORAL
  Filled 2017-11-30 (×36): qty 1

## 2017-11-30 MED ORDER — ASPIRIN EC 81 MG PO TBEC
81.0000 mg | DELAYED_RELEASE_TABLET | Freq: Every day | ORAL | Status: DC
  Administered 2017-11-30 – 2017-12-17 (×18): 81 mg via ORAL
  Filled 2017-11-30 (×18): qty 1

## 2017-11-30 MED ORDER — ALUM & MAG HYDROXIDE-SIMETH 200-200-20 MG/5ML PO SUSP
30.0000 mL | Freq: Three times a day (TID) | ORAL | Status: DC | PRN
  Administered 2017-12-01 – 2017-12-16 (×2): 30 mL via ORAL
  Filled 2017-11-30 (×2): qty 30

## 2017-11-30 MED ORDER — ESCITALOPRAM OXALATE 10 MG PO TABS
20.0000 mg | ORAL_TABLET | Freq: Every day | ORAL | Status: DC
  Administered 2017-11-30 – 2017-12-17 (×18): 20 mg via ORAL
  Filled 2017-11-30 (×18): qty 2

## 2017-11-30 MED ORDER — LEVOTHYROXINE SODIUM 25 MCG PO TABS
50.0000 ug | ORAL_TABLET | Freq: Every day | ORAL | Status: DC
  Administered 2017-11-30 – 2017-12-17 (×18): 50 ug via ORAL
  Filled 2017-11-30 (×20): qty 2

## 2017-11-30 NOTE — ED Provider Notes (Signed)
Patient had some chest pain. Lurline IdolI, Marikay Roads, attending physician, personally viewed and interpreted this EKG  EKG Time: 0551 Rate: 57 Rhythm: sinus bradycardia Axis: normal Intervals: qtc 445 QRS: narrow ST changes: no st elevation Impression: sinus bradycardia otherwise normal    Phineas SemenGoodman, Syriana Croslin, MD 11/30/17 [phone]

## 2017-11-30 NOTE — ED Notes (Signed)
Julien NordmannNkechi Johnson, DSS social worker [phone]) visited pt and spoke with Claudine LCSW.

## 2017-11-30 NOTE — ED Notes (Signed)

## 2017-11-30 NOTE — Progress Notes (Signed)
LCSW called Larena Soxshley Saunders [phone] and she will continue to call other places today for potential placements. She is her own guardian but has an open APS case- Called Max Fickleina Reese and left message.  Delta Air LinesClaudine Jadavion Spoelstra LCSW [phone]

## 2017-11-30 NOTE — ED Notes (Signed)
Patient received PM snack. 

## 2017-11-30 NOTE — [place] FL2 (Signed)
[place] MEDICAID FL2 LEVEL OF CARE SCREENING TOOL     IDENTIFICATION  Patient Name: [name] Birthdate: 1965-10-01 Sex: female Admission Date (Current Location): 11/28/2017  [facility] [place]   Facility and Address:  [facility], [address], [place], [place] [address]      Provider Number: [id number]  Attending Physician Name and Address:  No att. providers found  Relative Name and Phone Number:       Current Level of Care: Hospital Recommended Level of Care: Family Care Home Prior Approval Number:    Date Approved/Denied:   PASRR Number:    Discharge Plan: Domiciliary (Rest home)    Current Diagnoses: Patient Active Problem List   Diagnosis Date Noted  . Severe recurrent major depression without psychotic features (HCC) 08/10/2016  . Overdose by acetaminophen 08/08/2016  . Schizoaffective disorder, bipolar type (HCC)   . 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 GERD (gastroesophageal reflux disease) 09/24/2014  . COPD (chronic obstructive pulmonary disease) (HCC) 09/24/2014  . Borderline personality disorder (HCC) 09/24/2014  . Cerebral palsy (HCC) 08/31/2014    Orientation RESPIRATION BLADDER Height & Weight     Self, Situation, Time  Normal Continent Weight: 181 lb (82.1 kg) Height:  5\' 3"  (160 cm)  BEHAVIORAL SYMPTOMS/MOOD NEUROLOGICAL BOWEL NUTRITION STATUS      Continent    AMBULATORY STATUS COMMUNICATION OF NEEDS Skin   Independent Verbally Normal                       Personal Care Assistance Level of Assistance   Needs assistance with some of her ADLs  (minimal assistance- prompting         Functional Limitations Info  Sight, Hearing, Speech Sight Info: Adequate Hearing Info: Adequate Speech Info: Adequate    SPECIAL CARE FACTORS FREQUENCY                       Contractures Contractures Info: Not present    Additional Factors Info  Allergies, Code Status Code  Status Info: Full code Allergies Info: Penicillians           Current Medications (11/30/2017):  This is the current hospital active medication list Current Facility-Administered Medications  Medication Dose Route Frequency Provider Last Rate Last Dose  . alum & mag hydroxide-simeth (MAALOX/MYLANTA) 200-200-20 MG/5ML suspension 30 mL  30 mL Oral Q8H PRN Minna AntisPaduchowski, Kevin, MD      . aspirin EC tablet 81 mg  81 mg Oral Daily Minna AntisPaduchowski, Kevin, MD      . clonazePAM Scarlette Calico(KLONOPIN) tablet 0.5 mg  0.5 mg Oral BID Minna AntisPaduchowski, Kevin, MD      . divalproex (DEPAKOTE) DR tablet 500 mg  500 mg Oral BID Minna AntisPaduchowski, Kevin, MD      . escitalopram (LEXAPRO) tablet 20 mg  20 mg Oral Daily Minna AntisPaduchowski, Kevin, MD      . folic acid (FOLVITE) tablet 1 mg  1 mg Oral Daily Minna AntisPaduchowski, Kevin, MD      . levothyroxine (SYNTHROID, LEVOTHROID) tablet 50 mcg  50 mcg Oral QAC breakfast Minna AntisPaduchowski, Kevin, MD      . oxybutynin (DITROPAN-XL) 24 hr tablet 5 mg  5 mg Oral Daily Minna AntisPaduchowski, Kevin, MD      . risperiDONE (RISPERDAL) tablet 3 mg  3 mg Oral BID Minna AntisPaduchowski, Kevin, MD      . vitamin B-12 (CYANOCOBALAMIN) tablet 1,000 mcg  1,000 mcg Oral Daily Minna AntisPaduchowski, Kevin, MD  Current Outpatient Medications  Medication Sig Dispense Refill  . alum & mag hydroxide-simeth (MAALOX/MYLANTA) 200-200-20 MG/5ML suspension Take 30 mLs by mouth every 8 (eight) hours as needed for indigestion or heartburn.    Marland Kitchen aspirin EC 81 MG tablet Take 81 mg by mouth daily.    . cetirizine (ZYRTEC) 10 MG tablet Take 10 mg by mouth daily.    . clonazePAM (KLONOPIN) 0.5 MG tablet Take 0.5 mg by mouth 2 (two) times daily.     . divalproex (DEPAKOTE) 500 MG DR tablet Take 500 mg by mouth 2 (two) times daily.     Marland Kitchen escitalopram (LEXAPRO) 20 MG tablet Take 20 mg by mouth daily.     . Fluticasone-Salmeterol (ADVAIR) 250-50 MCG/DOSE AEPB Inhale 1 puff into the lungs 2 (two) times daily.     . folic acid (FOLVITE) 1 MG tablet Take 1 mg by mouth  daily.    Marland Kitchen levothyroxine (SYNTHROID, LEVOTHROID) 50 MCG tablet Take 50 mcg by mouth daily before breakfast.    . Menthol-Methyl Salicylate (MUSCLE RUB) 10-15 % CREA Apply 1 application topically every 6 (six) hours as needed for muscle pain.    . nitrofurantoin, macrocrystal-monohydrate, (MACROBID) 100 MG capsule Take 100 mg by mouth 2 (two) times daily.    Marland Kitchen oxybutynin (DITROPAN-XL) 5 MG 24 hr tablet Take 5 mg by mouth daily.    . polyethylene glycol (MIRALAX / GLYCOLAX) packet Take 17 g by mouth daily as needed.    . risperiDONE (RISPERDAL) 3 MG tablet Take 3 mg by mouth 2 (two) times daily.    . vitamin B-12 (CYANOCOBALAMIN) 1000 MCG tablet Take 1,000 mcg by mouth daily.    Marland Kitchen amantadine (SYMMETREL) 100 MG capsule Take 1 capsule (100 mg total) by mouth 2 (two) times daily. (Patient not taking: Reported on 11/28/2017) 60 capsule 0  . ARIPiprazole (ABILIFY) 5 MG tablet Take 5 mg by mouth daily.    Marland Kitchen azithromycin (ZITHROMAX Z-PAK) 250 MG tablet Take 2 tablets (500 mg) on  Day 1,  followed by 1 tablet (250 mg) once daily on Days 2 through 5. (Patient not taking: Reported on 11/28/2017) 6 each 0  . benzonatate (TESSALON PERLES) 100 MG capsule Take 1 capsule (100 mg total) by mouth 3 (three) times daily as needed for cough. (Patient not taking: Reported on 11/28/2017) 30 capsule 0  . fenofibrate 160 MG tablet Take 160 mg by mouth daily.    . furosemide (LASIX) 20 MG tablet Take 20 mg by mouth daily.    Marland Kitchen HYDROcodone-acetaminophen (NORCO/VICODIN) 5-325 MG tablet Take 1 tablet by mouth every 4 (four) hours as needed. (Patient not taking: Reported on 05/02/2017) 12 tablet 0  . hydrocortisone 2.5 % cream Apply 1 application topically 2 (two) times daily.    Marland Kitchen loperamide (IMODIUM) 2 MG capsule Take 1 capsule (2 mg total) by mouth 4 (four) times daily as needed for diarrhea or loose stools. (Patient not taking: Reported on 11/28/2017) 12 capsule 0  . Omega-3 Fatty Acids (FISH OIL PO) Take 1 capsule by mouth  daily.    . pantoprazole (PROTONIX) 40 MG tablet Take 40 mg by mouth daily.     . potassium chloride (K-DUR) 10 MEQ tablet Take 10 mEq by mouth daily.    . sucralfate (CARAFATE) 1 g tablet Take 1 g by mouth 4 (four) times daily -  with meals and at bedtime.       Discharge Medications: Please see discharge summary for a list of discharge medications.  Relevant Imaging Results:  Relevant Lab Results:   Additional Information SSN [id number]  Cheron SchaumannBandi, Lokelani Lutes M, [place]

## 2017-11-30 NOTE — Clinical Social Work Note (Signed)
Clinical Social Work Assessment  Patient Details  Name: [name] MRN: [id number] Date of Birth: 04/22/1965  Date of referral:  11/30/17               Reason for consult:  Housing Concerns/Homelessness                Permission sought to share information with:  Family Supports, Facility Contact Representative Permission granted to share information::  Yes, Verbal Permission Granted  Name::        Agency::     Relationship::     Contact Information:     Housing/Transportation Living arrangements for the past 2 months:  Homeless Source of Information:  Medical Team, Case Manager([name] [facility] Coord[facility] [phone]) Patient Interpreter Needed:  None Criminal Activity/Legal Involvement Pertinent to Current Situation/Hospitalization:  No - Comment as needed Significant Relationships:    Lives with:  Siblings(Brother [name]) Do you feel safe going back to the place where you live?  No Need for family participation in patient care:  No (Coment)  Care giving concerns:  TBD   Social Worker assessment / plan:  LCSW met with patient and was given verbal consent to find her a home. This worker collected information from [facility] Coordinator [name] [phone]. Patient has no guardian but is being reviewed by APS- This needs to be confirmed. 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 LCSW will send out information via HUB and complete Fl2.  Employment status:   None Insurance information:  Medicare, Medicaid In State PT Recommendations:   NA Information / Referral to community resources:   Will be provided based on needs  Patient/Family's Response to care:  TBD  Patient/Family's Understanding of and Emotional Response to Diagnosis, Current Treatment, and Prognosis:  Patient is happy at hospital  Emotional Assessment Appearance:  Appears stated age Attitude/Demeanor/Rapport:  Attention Seeking, Inconsistent Affect  (typically observed):  Blunt Orientation:  Oriented to Self, Oriented to Place Alcohol / Substance use:  Not Applicable Psych involvement (Current and /or in the community):  Yes (Comment)  Discharge Needs  Concerns to be addressed:  Care Coordination, Developmental Concerns, Homelessness, Mental Health Concerns Readmission within the last 30 days:  Yes(Last week went back to Group Home they refuse to take her back) Current discharge risk:  Homeless Barriers to Discharge:  Homeless with medical needs, Unsafe home situation   ,  M, LCSW 11/30/2017, 9:02 AM  

## 2017-11-30 NOTE — ED Provider Notes (Signed)
-----------------------------------------   5:21 AM on 11/30/2017 -----------------------------------------   BP 119/78 (BP Location: Left Arm)   Pulse 66   Temp (!) 97.5 F (36.4 C) (Oral)   Resp 18   Ht 5\' 3"  (1.6 m)   Wt 82.1 kg   SpO2 95%   BMI 32.06 kg/m   No acute events since last update.  Disposition is pending per Psychiatry/Behavioral Medicine team recommendations.     Phineas SemenGoodman, Ezmae Speers, MD 11/30/17 [phone]

## 2017-11-30 NOTE — Progress Notes (Signed)
LCSW [facility] several facilities and [facility] options and we have a previous group home who has a female bed available at the end of August. [facility] with [facility] 5 [facility] and knows patient well.  LCSW facilitated a meeting with DSS APS worker she has deemed patient to be incompetent therefore this is noted patient not to DC unless to family and or facility.  Aili Casillas LCSW

## 2017-11-30 NOTE — ED Notes (Signed)
Sherri BradfordKimberly continues to endorse SI, HI, and AVH. She says she is hearing voices to kill people and seeing flies and bees. "That's why I need to go downstairs." She defecated on herself earlier and cleaned herself up without seeking staff assistance. Patient remains safe with monitoring, checks, and rounds in place.

## 2017-12-01 DIAGNOSIS — F259 Schizoaffective disorder, unspecified: Secondary | ICD-10-CM | POA: Diagnosis not present

## 2017-12-01 NOTE — ED Notes (Signed)
Patient ate 100% of lunch and beverage.  

## 2017-12-01 NOTE — ED Provider Notes (Signed)
-----------------------------------------   9:25 AM on 12/01/2017 -----------------------------------------   Blood pressure 133/68, pulse 87, temperature 98.4 F (36.9 C), temperature source Oral, resp. rate 17, height 5\' 3"  (1.6 m), weight 82.1 kg, SpO2 100 %.  The patient had no acute events since last update.  Calm and cooperative at this time.  Disposition is pending Psychiatry/Behavioral Medicine team recommendations.     Willy Eddyobinson, Edmond Ginsberg, MD 12/01/17 [phone]

## 2017-12-01 NOTE — ED Notes (Signed)
Patient is eating dinner

## 2017-12-01 NOTE — ED Notes (Signed)
Patient ate 100% of breakfast and beverage.  

## 2017-12-01 NOTE — ED Provider Notes (Signed)
-----------------------------------------   5:37 PM on 12/01/2017 -----------------------------------------   Blood pressure 125/80, pulse 70, temperature 98.4 F (36.9 C), temperature source Oral, resp. rate 18, height 1.6 m (5\' 3" ), weight 82.1 kg, SpO2 99 %.  Patient is complaining of midsternal chest pain which is a frequent complaint of hers.  She has been asking the nurse to be evaluated.  We will obtain EKG, but I reviewed her record and in the last 2 years she has had 12 or 13 negative troponins.  She is in no acute distress at this time so as long as there is no concerning findings on her EKG I think no additional intervention is recommended.  ED ECG REPORT I, Loleta Roseory Senovia Gauer, the attending physician, personally viewed and interpreted this ECG.  Date: 12/01/2017 EKG Time: 17:48 Rate: 65 Rhythm: normal sinus rhythm with PVC QRS Axis: normal Intervals: normal ST/T Wave abnormalities: Non-specific ST segment / T-wave changes, but no evidence of acute ischemia. Narrative Interpretation: no evidence of acute ischemia     Loleta RoseForbach, Camren Henthorn, MD 12/02/17 0001

## 2017-12-01 NOTE — ED Notes (Signed)
EKG complete.

## 2017-12-01 NOTE — ED Notes (Signed)

## 2017-12-01 NOTE — ED Notes (Signed)
Patient complaining of chest pain, no sob noted, she states it bothers her at times, keeps asking nurse for x-ray, v/s obtained, 119/84, 66 and 100% of room air,nurse ask Patient if she has heart burn or indigestion, and she states " sometimes" nurse will continue to monitor, q 15 minute checks and camera surveillance in progress for safety.

## 2017-12-01 NOTE — ED Notes (Signed)
Patient received PM snack. 

## 2017-12-01 NOTE — ED Notes (Signed)
Patient refused shower, but did want to wash up in sink, nurse gave her bath items. Patient is cooperative, no behavioral issues.

## 2017-12-01 NOTE — Progress Notes (Signed)
11/30/17 APS worker came out to assess patient. [name] work cell [phone] She does not have capacity-[facility]W [facility]  [facility] [phone] to take patient at End of August. [facility] is very familiar with patient and cared for her 2001-2008. Faxed over Fl2 and assessment [phone]  Delta Air LinesClaudine Kwali Wrinkle LCSW [phone]

## 2017-12-01 NOTE — ED Notes (Signed)
Patient continues to complain of chest pain, no diaphoresis, no sob noted, v/s obtained, 101/55, 61 and 99%  room air, patient stated it feels like her heart is having an extra beat. MD notifies since this is her second time today c/o chest pain. Nurse will continue to monitor, q 15 minute checks and camera surveillance in progress for safety.   EKG ordered

## 2017-12-02 DIAGNOSIS — F259 Schizoaffective disorder, unspecified: Secondary | ICD-10-CM | POA: Diagnosis not present

## 2017-12-02 NOTE — ED Notes (Signed)
Hourly rounding reveals patient sleeping in room. No complaints, stable, in no acute distress. Q15 minute rounds and monitoring via Security Cameras to continue. 

## 2017-12-02 NOTE — ED Notes (Signed)
Pt given supper tray. Pt has ask multiple times when she would be able to go downstairs and that she does not want to go to central. Pt was informed that no one has told her she was going to central and she would not be going downstairs today and possibly not tomorrow either. Pt ask what would happen if she quit taking her meds. This tech informed her that it would only make her feel bad and that it would not change any of the plans in place.

## 2017-12-02 NOTE — ED Notes (Signed)
Patient is alert and oriented to person.  Patient presents with calm affect and mood.  Denies auditory and visual hallucinations at this moment.  Patient is in her room sitting on the bed.  No behavioral issues noted at this time.  Routine safety checks maintained every 15 minutes. Patient continues to ask "when am I going downstairs, I am ready to go downstairs".

## 2017-12-02 NOTE — ED Notes (Signed)
Hourly rounding reveals patient in room. No complaints, stable, in no acute distress. Q15 minute rounds and monitoring via Security Cameras to continue. 

## 2017-12-02 NOTE — ED Notes (Signed)
Report to include Situation, Background, Assessment, and Recommendations received from [facility] RN. Patient alert, warm and dry, in no acute distress. Patient denies pain. Patient states she has SI without plan, HI towards people in general and hears voices without command and sees "shadows". Patient made aware of Q15 minute rounds and security cameras for their safety. Patient instructed to come to me with needs or concerns.

## 2017-12-02 NOTE — ED Notes (Signed)
Pt given lunch tray and sprite  

## 2017-12-02 NOTE — ED Notes (Signed)
Patient says she does not like being on this floor, patient said "being here ([facility]) isnt doing anything for me, what would happen if I stop taking my medication". Explained to patient that's not how you get what you want or need, not taking your medication, explained to her that she has to be patient. Let her know that not taking her medication is not an option, we are here to help her get better and medication is one way of her getting better. Discussed with her that if beds are full, we have to wait until one opens up and then she may leave.  Patient continues to ask when is she leaving.

## 2017-12-02 NOTE — ED Notes (Signed)
Pt given breakfast tray

## 2017-12-02 NOTE — ED Provider Notes (Signed)
-----------------------------------------   7:17 AM on 12/02/2017 -----------------------------------------   Blood pressure (!) 101/55, pulse 61, temperature 98.4 F (36.9 C), temperature source Oral, resp. rate 18, height 5\' 3"  (1.6 m), weight 82.1 kg, SpO2 99 %.  The patient had no acute events since last update.  Calm and cooperative at this time.  Disposition is pending Psychiatry/Behavioral Medicine team recommendations.     Irean HongSung, Shirel Mallis J, MD 12/02/17 [phone]

## 2017-12-03 DIAGNOSIS — F259 Schizoaffective disorder, unspecified: Secondary | ICD-10-CM | POA: Diagnosis not present

## 2017-12-03 NOTE — ED Notes (Signed)
Pt asleep, breakfast tray placed in rm.  

## 2017-12-03 NOTE — ED Notes (Signed)
Pt to nurses station door stating she is "hearing voices and seeing things that aren't real."   RN will inform Dr. Toni Amendlapacs.  *RN was in dayroom minutes earlier and heard another patient giving [name] advice on what to say to be admitted downstairs.   Maintained on 15 minute checks and observation by security camera for safety.

## 2017-12-03 NOTE — ED Notes (Signed)
Hourly rounding reveals patient sleeping in room. No complaints, stable, in no acute distress. Q15 minute rounds and monitoring via Security Cameras to continue. 

## 2017-12-03 NOTE — ED Notes (Signed)
Hourly rounding reveals patient in room. No complaints, stable, in no acute distress. Q15 minute rounds and monitoring via Security Cameras to continue. 

## 2017-12-03 NOTE — ED Notes (Signed)
Report to include Situation, Background, Assessment, and Recommendations received from Amy B. RN. Patient alert and oriented, warm and dry, in no acute distress. Patient denies states she still has SI without a plan and hears voices talking without command and see "shadows", no c/o pain. Patient made aware of Q15 minute rounds and security cameras for their safety. Patient instructed to come to me with needs or concerns.

## 2017-12-03 NOTE — Clinical Social Work Note (Signed)
Per chart notes, patient has a bed offer for the end of the August from [facility]. CSW spoke with [facility] Larena Soxshley Saunders [phone]) and provided update. Ms. Lelon PerlaSaunders stated she has [place] all mental health group homes [place] and some outside of the [place] and has not received any offers. Ms. Lelon PerlaSaunders will follow up with [facility] about placement option and will also reach out to Adult Protective Services (APS) Social Worker Julien NordmannNkechi Johnson to follow up on plan for guardianship. CSW continuing to follow for discharge needs.   Sherri HoveJeneya Marvell Green, Theresia MajorsLCSWA, [facility] Clinical Social Worker-Emergency Department [phone]

## 2017-12-03 NOTE — ED Notes (Signed)
Patient is sleeping at this time, no signs of distress, respirations even and unlabored, will continue to monitor, q 15 minute checks and camera surveillance in progress for safety.

## 2017-12-03 NOTE — ED Notes (Signed)
Hourly rounding reveals patient in day room. No complaints, stable, in no acute distress. Q15 minute rounds and monitoring via Security Cameras to continue. 

## 2017-12-03 NOTE — ED Notes (Signed)
Patient VOL/Pending Placement  

## 2017-12-03 NOTE — ED Notes (Signed)
VOL/Pending Placement 

## 2017-12-04 DIAGNOSIS — F259 Schizoaffective disorder, unspecified: Secondary | ICD-10-CM | POA: Diagnosis not present

## 2017-12-04 NOTE — ED Notes (Signed)
Hourly rounding reveals patient sleeping in room. No complaints, stable, in no acute distress. Q15 minute rounds and monitoring via Security Cameras to continue. 

## 2017-12-04 NOTE — ED Notes (Signed)
Patient said " I need to talk to somebidy", when asked who she wants to speak with she said the Chaplain. Patient did not discuss why she wanted to talk with chaplain.

## 2017-12-04 NOTE — ED Notes (Signed)
VOL  PENDING  PLACEMENT 

## 2017-12-04 NOTE — Clinical Social Work Note (Signed)
No new developments. Pending bed offer for end of August. CSW continuing to follow for discharge needs.   Corlis HoveJeneya Bessy Reaney, Theresia MajorsLCSWA, [facility] Clinical Social Worker-Emergency Department [phone]

## 2017-12-04 NOTE — Progress Notes (Signed)
Chaplain received page to visit patient. Chaplain went and nurse took her to patient room to visit. Chaplain talk with patient about her wanting to leave the hospital. Patient is concerned about her living status and wanted Chaplain to just listen to her. Patient told Chaplain she was homicidal and suicidal and she was so angry and frustrated for being here and she wanted to leave hospital.Chaplain listen and gave encouragement and emotional support..Marland Kitchen

## 2017-12-04 NOTE — ED Notes (Signed)
Chaplain called.  

## 2017-12-04 NOTE — ED Notes (Signed)
Patient talking with her social worker Julien NordmannKechi Johnson work cell [phone]

## 2017-12-04 NOTE — ED Provider Notes (Signed)
-----------------------------------------   4:48 AM on 12/04/2017 -----------------------------------------   Blood pressure 121/70, pulse 97, temperature 97.6 F (36.4 C), temperature source Oral, resp. rate 16, height 5\' 3"  (1.6 m), weight 82.1 kg, SpO2 100 %.  The patient had no acute events since last update.  Calm and cooperative at this time.  Disposition is pending Psychiatry/Behavioral Medicine team recommendations.     Merrily Brittleifenbark, Absalom Aro, MD 12/04/17 [phone]

## 2017-12-04 NOTE — ED Notes (Signed)
Patient talking to chaplain 

## 2017-12-05 DIAGNOSIS — F259 Schizoaffective disorder, unspecified: Secondary | ICD-10-CM | POA: Diagnosis not present

## 2017-12-05 NOTE — ED Provider Notes (Signed)
Vitals:   12/03/17 1936 12/04/17 1003  BP: 121/70 102/74  Pulse: 97 71  Resp: 16 16  Temp: 97.6 F (36.4 C) (!) 97.4 F (36.3 C)  SpO2: 100% 98%   Patient remains medically cleared for psychiatric evaluation and disposition   Emily FilbertWilliams, Shoshanah Dapper E, MD 12/05/17 [phone]

## 2017-12-05 NOTE — ED Notes (Signed)
Hourly rounding reveals patient sleeping in room. No complaints, stable, in no acute distress. Q15 minute rounds and monitoring via Security Cameras to continue. 

## 2017-12-05 NOTE — ED Notes (Signed)
Patient in dayroom on the phone at this time.

## 2017-12-05 NOTE — ED Notes (Signed)
Hourly rounding reveals patient in room. No complaints, stable, in no acute distress. Q15 minute rounds and monitoring via Security Cameras to continue.Snack and beverage given. 

## 2017-12-05 NOTE — ED Notes (Signed)
Patient had incontinent episode (urine) in bed.

## 2017-12-05 NOTE — ED Notes (Signed)
Report to include Situation, Background, Assessment, and Recommendations received from [facility] RN. Patient alert and oriented, warm and dry, in no acute distress. Patient denies HI, AVH and pain. Patient made aware of Q15 minute rounds and security cameras for their safety. Patient instructed to come to me with needs or concerns.

## 2017-12-05 NOTE — ED Notes (Signed)
Patient states " I'm feeling SI, and I am going to get out walk into traffic, she also states that she will take a bunch of pills, and then ask " when can I go down stairs, nurse let her know that the case worker is looking for placement for her, and she said" I want to get out of here this is not helping me' Nurse listened, showed empathy and will continue to monitor, q 15 minute checks and camera surveillance in progress for safety.

## 2017-12-05 NOTE — Clinical Social Work Note (Signed)
CSW staffed case with Clinical Social Work Surveyor, quantity [employer]. CSW received a call from [place] stating patient wanting to speak with CSW. CW met with patient in dayroom. Patient stating she was informed that she would be "getting out of here in 4 days." CSW informed patient that information is incorrect and that at this time there is no finalized date for her discharge, as we are still trying to confirm placement arrangements. Patient states she was told this by her Adult Scientist, forensic (APS) Social Worker [employer]. CSW called Ms. Johnson to verify conversation with patient. Ms. Wynetta Emery stated she "never gave Ms. Damore a number of days." Ms. Wynetta Emery stated she did let patient know a placement option has been located, but stated she asked "Ms. Basden to be patient." CSW updated patient with information provided by Ms. Johnson. Patient stated "ok." Patient asked can she "go downstairs." CSW stated no, as that decision has already been determined by the psychiatrist. Patient stated "ok." CSW provided provided emotional support to patient. CSW to update patient when update available. CSW continuing to follow for discharge needs.   Oretha Ellis, Latanya Presser, [place] Worker-Emergency Department [phone]

## 2017-12-05 NOTE — ED Notes (Signed)
VOL/Pending Placement 

## 2017-12-05 NOTE — ED Notes (Signed)
Patient in dayroom watching tv at this time.

## 2017-12-06 DIAGNOSIS — F259 Schizoaffective disorder, unspecified: Secondary | ICD-10-CM | POA: Diagnosis not present

## 2017-12-06 NOTE — ED Notes (Signed)
Hourly rounding reveals patient sleeping in room. No complaints, stable, in no acute distress. Q15 minute rounds and monitoring via Security Cameras to continue. 

## 2017-12-06 NOTE — Clinical Social Work Note (Signed)
CSW spoke with [facility] to confirm patient's admission to facility. Ms. [facility] stated she is agreeable to accept patient and can pick up on Monday 12/17/17. Ms. [facility] Riley[facility] stated she would need an updated FL-2 and prescriptions for all medication faxed to [facility]. Ms. Clovis RileyMitchell stated that she would inform of a time of pick up closer to the date. CSW continuing to follow for  discharge needs.   Corlis HoveJeneya Gradyn Shein, Theresia MajorsLCSWA, [facility] Clinical Social Worker-Emergency Department [phone]

## 2017-12-06 NOTE — Progress Notes (Signed)
Pt A & O to self and place. Endorsed passive SI and HI with +AVH "I hear voices all the time and I see bugs, ants crawling on the floor". Verbally contracts for safety. Remains somatic on interactions. "I'm high fall risk so I can't dress myself, my chest hurts really bad, I want to go to another hospital". However, pt presents calm, watching TV in dayroom, VSS & documented, denies dizziness, skin is warm and dry to touch, tolerated all meals and fluids well. Showered and changed bed linens and scrubs with minimal staff assistance. Ambulatory in milieu with a steady gait. Emotional support and reassurance offered. Encouraged pt to comply with current treatment regimen and to voice concerns. Scheduled medications given as ordered with verbal education and effects monitored. Safety checks continues without self harm gestures.

## 2017-12-07 DIAGNOSIS — F259 Schizoaffective disorder, unspecified: Secondary | ICD-10-CM | POA: Diagnosis not present

## 2017-12-07 NOTE — ED Notes (Signed)
VOL  PENDING  PLACEMENT 

## 2017-12-07 NOTE — ED Notes (Signed)
Hourly rounding reveals patient sleeping in room. No complaints, stable, in no acute distress. Q15 minute rounds and monitoring via Security Cameras to continue. 

## 2017-12-07 NOTE — ED Provider Notes (Signed)
-----------------------------------------   6:52 AM on 12/07/2017 -----------------------------------------   Blood pressure 114/70, pulse 78, temperature 97.7 F (36.5 C), temperature source Oral, resp. rate 18, height 5\' 3"  (1.6 m), weight 82.1 kg, SpO2 100 %.  The patient had no acute events since last update.  Calm and cooperative at this time.  Disposition is pending Psychiatry/Behavioral Medicine team recommendations.     Irean HongSung, Jade J, MD 12/07/17 [phone]

## 2017-12-08 DIAGNOSIS — F259 Schizoaffective disorder, unspecified: Secondary | ICD-10-CM | POA: Diagnosis not present

## 2017-12-08 MED ORDER — LORAZEPAM 1 MG PO TABS
1.0000 mg | ORAL_TABLET | Freq: Once | ORAL | Status: AC
  Administered 2017-12-08: 1 mg via ORAL
  Filled 2017-12-08: qty 1

## 2017-12-08 MED ORDER — LEVOTHYROXINE SODIUM 25 MCG PO TABS
ORAL_TABLET | ORAL | Status: AC
  Administered 2017-12-08: 10:00:00
  Filled 2017-12-08: qty 2

## 2017-12-08 NOTE — ED Provider Notes (Signed)
-----------------------------------------   7:21 AM on 12/08/2017 -----------------------------------------   Blood pressure 111/75, pulse 83, temperature 98 F (36.7 C), temperature source Oral, resp. rate 18, height 5\' 3"  (1.6 m), weight 82.1 kg, SpO2 98 %.  The patient had no acute events since last update.  Calm and cooperative at this time.  Disposition is pending Psychiatry/Behavioral Medicine team recommendations.     Jeanmarie PlantMcShane, Omya Winfield A, MD 12/08/17 [phone]

## 2017-12-08 NOTE — Progress Notes (Signed)
Pt visible in dayroom at this time. Denies SI, HI, AVH and pain when assessed "No not now". Present flat and irritable on interactions. Enuresis X1 last night. Pt refused to shower and change bed linens and was verbally abusive to writer when approach to clean up "no, you are not my damn mama, my room is fine, I'm fine, you stick bitch". Apologetic to staff later "I'm sorry I don't mean it, I want to do right by you". Encouraged to attend to ADLS, supplies given and still has not made her bed or shower. Compliant with medications when offered, denies adverse drug reactions. Support and availability offered. Safety checks continues without incident.

## 2017-12-08 NOTE — ED Notes (Signed)

## 2017-12-08 NOTE — Progress Notes (Addendum)
Pt observed biting self in dayroom, "I'm hearing voices, I'am anxious, I want to go home, [facility] need to come see me and I want to see Claudine, I need to know when I will leave". Dr. Alphonzo LemmingsMcshane notified. New order received for Ativan 1mg  PO X 1 now. Pt informed of d/c date being 8/31 and left message for Claudine to come see pt. Safety checks maintained. Continued support, reassurance and encouragement provided as needed.

## 2017-12-08 NOTE — ED Notes (Signed)
Patient received PM snack. 

## 2017-12-08 NOTE — ED Notes (Signed)
Dinner meal given to patient. 

## 2017-12-08 NOTE — ED Notes (Signed)

## 2017-12-08 NOTE — ED Notes (Signed)
Voluntary/ pending placement 

## 2017-12-09 DIAGNOSIS — F259 Schizoaffective disorder, unspecified: Secondary | ICD-10-CM | POA: Diagnosis not present

## 2017-12-09 NOTE — Progress Notes (Signed)
LCSW reviewed hand off note and there is no change patient will [facility] Sept [facility]nd [facility] Phone number [phone]     Arrie SenateClaudine Loghan Kurtzman LCSW [phone]

## 2017-12-09 NOTE — ED Notes (Signed)
Hourly rounding reveals patient in room. No complaints, stable, in no acute distress. Q15 minute rounds and monitoring via Security Cameras to continue. 

## 2017-12-09 NOTE — ED Notes (Signed)
Patient alert and oriented, warm and dry, in no acute distress. Patient denies SI, HI, pain and reports AVH . Patient made aware of Q15 minute rounds and security cameras for their safety. Patient instructed to come to me with needs or concerns.

## 2017-12-09 NOTE — ED Notes (Signed)
Pt given meal tray but refused drink. States she already had some. No other needs voiced at this time. Will continue to monitor Q15 minute rounds.

## 2017-12-09 NOTE — ED Notes (Signed)
After awakening to an episode of urinary incontinence this morning, patient was given the opportunity to clean her room and take a shower. Patient says she is doing "so-so" and endorses seeing ants and having SI/HI. She contracts for safety. She has taken scheduled medications without issue. Pt has remained in the dayroom for most of the day. Patient is eagerly awaiting her discharge to her placement. She remains safe with checks, rounds, and camera monitoring in place.

## 2017-12-09 NOTE — ED Notes (Signed)
Report to include situation, background, assessment and recommendations from RN Karen. Patient sleeping, respirations regular and unlabored. Q15 minute rounds and security camera observation to continue.    

## 2017-12-09 NOTE — ED Provider Notes (Signed)
-----------------------------------------   7:37 AM on 12/09/2017 -----------------------------------------   Blood pressure 114/74, pulse 71, temperature 97.7 F (36.5 C), temperature source Oral, resp. rate 17, height 5\' 3"  (1.6 m), weight 82.1 kg, SpO2 100 %.  The patient had no acute events since last update.  Calm and cooperative at this time.  Psychiatry recommends social work disposition.     Rebecka ApleyWebster, Allison P, MD 12/09/17 [phone]

## 2017-12-10 DIAGNOSIS — F259 Schizoaffective disorder, unspecified: Secondary | ICD-10-CM | POA: Diagnosis not present

## 2017-12-10 NOTE — ED Notes (Signed)
Hourly rounding reveals patient in room. No complaints, stable, in no acute distress. Q15 minute rounds and monitoring via Security Cameras to continue. 

## 2017-12-10 NOTE — ED Notes (Signed)
Pt stated, "The medication I take for hallucinations isn't working."   RN assured patient she would inform psychiatrist.  Pt accepting.   Maintained on 15 minute checks and observation by security camera for safety.

## 2017-12-10 NOTE — Progress Notes (Signed)
   12/10/17 1455  Clinical Encounter Type  Visited With Patient  Visit Type Initial;Psychological support;Behavioral Health;ED  Referral From Nurse  Consult/Referral To Chaplain  Spiritual Encounters  Spiritual Needs Prayer;Emotional   CH was requested by the patient to visit her. [name] was sitting in the common area of Behavorial health lock down in the ED. She was concerned about her new placement that is coming up. She also wanted to tell me that she was having hallucinations and hearing voices. She knew that the voices were not real but she seemed to be frightened by them. I told [name] that I would try to visit her again.

## 2017-12-10 NOTE — Clinical Social Work Note (Signed)
No new developments. Plan is for patient to [facility] CSW continuing to follow for discharge needs.   Corlis HoveJeneya Shermar Friedland, Theresia MajorsLCSWA, [facility] Clinical Social Worker-Emergency Department [phone]

## 2017-12-10 NOTE — ED Notes (Signed)
VOL  PENDING  PLACEMENT 

## 2017-12-10 NOTE — ED Notes (Signed)
Pt speaking with chaplain per patient request.

## 2017-12-10 NOTE — ED Provider Notes (Signed)
-----------------------------------------   7:34 AM on 12/10/2017 -----------------------------------------   BP 115/70 (BP Location: Left Arm)   Pulse 73   Temp 98.4 F (36.9 C) (Oral)   Resp 16   Ht 5\' 3"  (1.6 m)   Wt 82.1 kg   SpO2 99%   BMI 32.06 kg/m   No acute events since last update.   Disposition is pending per Psychiatry/Behavioral Medicine team recommendations.     Phineas SemenGoodman, Nellie Chevalier, MD 12/10/17 [phone]

## 2017-12-11 DIAGNOSIS — F259 Schizoaffective disorder, unspecified: Secondary | ICD-10-CM | POA: Diagnosis not present

## 2017-12-11 NOTE — ED Notes (Signed)
Hourly rounding reveals patient sleeping in room. No complaints, stable, in no acute distress. Q15 minute rounds and monitoring via Security Cameras to continue. 

## 2017-12-11 NOTE — ED Notes (Signed)
Vol pending placement possibly 9/2

## 2017-12-11 NOTE — Clinical Social Work Note (Signed)
No new developments. Plan is for patient to discharge on Monday 12/17/17 to [facility]. CSW continuing to follow for discharge needs.   Simara Rhyner, LCSWA, LCASA Clinical Social Worker-Emergency Department [phone] 

## 2017-12-11 NOTE — ED Notes (Signed)
Writer went into patients room to talk with her, patient has a very strong urine odor in her room, writer asked patient how she was doing and if she wanted she and I to clean her room. Patient stated "no I am tired", asked patient if she wanted to come out into the dayroom and talk with staff patient responded "no". Let patient know we are going to tidy and clean her room when she wakes up. Patient said "ok"

## 2017-12-11 NOTE — ED Notes (Signed)
Patient woke up and asked to take a shower, patient has urinated all over her bed and bed sheets. Patient is taking a shower.

## 2017-12-11 NOTE — ED Notes (Addendum)
Talked with patient about hygiene, and proper body care. Patient was not responsive with conversation, patient was not listening, she started to discuss her medical complaints and asked have they found me somewhere to go.   Patient denies SI/HI/AVH, calmly sitting in dayroom watching news

## 2017-12-11 NOTE — ED Provider Notes (Signed)
-----------------------------------------   7:27 AM on 12/11/2017 -----------------------------------------   Blood pressure 115/70, pulse 73, temperature 98.4 F (36.9 C), temperature source Oral, resp. rate 16, height 5\' 3"  (1.6 m), weight 82.1 kg, SpO2 99 %.  The patient had no acute events since last update.  Calm and cooperative at this time.  The patient is awaiting social work placement.     Rebecka ApleyWebster, Braelon Sprung P, MD 12/11/17 [phone]

## 2017-12-12 DIAGNOSIS — F259 Schizoaffective disorder, unspecified: Secondary | ICD-10-CM | POA: Diagnosis not present

## 2017-12-12 NOTE — ED Notes (Signed)
Patient took wash up at sink in bathroom, clean scrubs and clean linen to bed.

## 2017-12-12 NOTE — ED Notes (Signed)
Patient ate 100% of lunch and beverage.  

## 2017-12-12 NOTE — ED Notes (Signed)
Patient sitting in dayroom, she is calm and cooperative. No signs of distress.

## 2017-12-12 NOTE — ED Notes (Signed)
VOL  PENDING  PLACEMENT 

## 2017-12-12 NOTE — ED Notes (Signed)
Pt. Asked how are you doing today.  Pt. States feeling sore.  Pt. Unsure why she is feeling sore.  Pt. States "I will be going shortly"  Pt. States "they found me a place to stay, just working out paperwork".  Pt. Asked are you excited about going to a new place?  Pt. Responds "Yes".  Pt. Denies SI or HI.

## 2017-12-12 NOTE — ED Notes (Signed)
Patient ate 100% of breakfast tray and beverage, she is polite and compliant this am, bed saturated in urine, incontinent episodes noted, nurse changed linen and gave Patient shower items, but she only wanted to wash up in the sink, nurse will continue to monitor.

## 2017-12-12 NOTE — ED Notes (Signed)
Pt. Requested and was given meal tray and soda.

## 2017-12-12 NOTE — ED Notes (Signed)
Patient ate 100% of supper and beverage.  

## 2017-12-12 NOTE — Clinical Social Work Note (Signed)
CSW received a call from [facility] Larena Soxshley Saunders [phone]). CSW provided update. Ms. Lelon PerlaSaunders to come visit with patient tomorrow, Thursday 12/13/17. Plan is for patient to discharge onMonday 9[facility]2/19[facility] [phone]). CSW continuing to follow fordischarge needs.   Corlis HoveJeneya Annisten Manchester, Theresia MajorsLCSWA, [facility] Clinical Social Worker-Emergency Department [phone]

## 2017-12-12 NOTE — ED Provider Notes (Signed)
Vitals:   12/09/17 2004 12/11/17 1807  BP: 115/70 100/70  Pulse: 73 89  Resp: 16 16  Temp: 98.4 F (36.9 C) 98.6 F (37 C)  SpO2: 99% 98%   Patient remains medically stable for psychiatric evaluation and disposition   Emily FilbertWilliams, Matelyn Antonelli E, MD 12/12/17 [phone]

## 2017-12-13 DIAGNOSIS — F259 Schizoaffective disorder, unspecified: Secondary | ICD-10-CM | POA: Diagnosis not present

## 2017-12-13 NOTE — ED Notes (Signed)
Representative from [employer] in to see patient.  Pt laid in bed while representative was talking to her.

## 2017-12-13 NOTE — ED Notes (Signed)
VOL  PENDING  PLACEMENT 

## 2017-12-13 NOTE — ED Provider Notes (Signed)
-----------------------------------------   12:13 AM on 12/13/2017 -----------------------------------------   Blood pressure 110/68, pulse 65, temperature (!) 97.4 F (36.3 C), temperature source Oral, resp. rate 16, height 5\' 3"  (1.6 m), weight 82.1 kg, SpO2 100 %.  The patient had no acute events since last update.  Calm and cooperative at this time.  Disposition is pending Psychiatry/Behavioral Medicine team recommendations.     Merrily Brittleifenbark, Casper Pagliuca, MD 12/13/17 [phone]

## 2017-12-13 NOTE — ED Notes (Signed)
Nurse attempted x 2 to get patient up so that her bed could be changed along with her scrubs.  Pt currently sitting on the side of bed eating her breakfast and lunch tray.  Informed patient to let nurse know when she is finished eating so that her bed could be changed and she could be cleaned up.

## 2017-12-13 NOTE — ED Notes (Signed)
Hourly rounding reveals patient sleeping in room. No complaints, stable, in no acute distress. Q15 minute rounds and monitoring via Security Cameras to continue. 

## 2017-12-13 NOTE — ED Notes (Signed)
Pt up to bathroom.   Given clean pair of scrubs, underwear, soap and wash clothes.  Pt instructed to bathe herself.  Staff cleaned pt's room and changed pt's bed.  Pt instructed to get up and go to the bathroom when needed.

## 2017-12-14 ENCOUNTER — Other Ambulatory Visit: Payer: Self-pay

## 2017-12-14 DIAGNOSIS — F603 Borderline personality disorder: Secondary | ICD-10-CM | POA: Diagnosis not present

## 2017-12-14 DIAGNOSIS — F259 Schizoaffective disorder, unspecified: Secondary | ICD-10-CM | POA: Diagnosis not present

## 2017-12-14 MED ORDER — DIVALPROEX SODIUM 500 MG PO DR TAB: 500.0000 mg | DELAYED_RELEASE_TABLET | Freq: Two times a day (BID) | ORAL | 1 refills | Status: DC

## 2017-12-14 MED ORDER — RISPERIDONE 3 MG PO TABS: 3.0000 mg | ORAL_TABLET | Freq: Two times a day (BID) | ORAL | 1 refills | Status: DC

## 2017-12-14 MED ORDER — CLONAZEPAM 0.5 MG PO TABS: 0.5000 mg | ORAL_TABLET | Freq: Two times a day (BID) | ORAL | 1 refills | Status: DC

## 2017-12-14 MED ORDER — LEVOTHYROXINE SODIUM 50 MCG PO TABS: 50.0000 ug | ORAL_TABLET | Freq: Every day | ORAL | 1 refills | Status: DC

## 2017-12-14 MED ORDER — ALUM & MAG HYDROXIDE-SIMETH 200-200-20 MG/5ML PO SUSP: 30.0000 mL | Freq: Three times a day (TID) | ORAL | 1 refills | Status: DC | PRN

## 2017-12-14 MED ORDER — FUROSEMIDE 20 MG PO TABS: 20.0000 mg | ORAL_TABLET | Freq: Every day | ORAL | 1 refills | Status: DC

## 2017-12-14 MED ORDER — FLUTICASONE-SALMETEROL 250-50 MCG/DOSE IN AEPB: 1.0000 | INHALATION_SPRAY | Freq: Two times a day (BID) | RESPIRATORY_TRACT | 1 refills | Status: DC

## 2017-12-14 MED ORDER — ESCITALOPRAM OXALATE 20 MG PO TABS: 20.0000 mg | ORAL_TABLET | Freq: Every day | ORAL | 1 refills | Status: DC

## 2017-12-14 MED ORDER — TUBERCULIN PPD 5 UNIT/0.1ML ID SOLN
5.0000 [IU] | Freq: Once | INTRADERMAL | Status: AC
  Administered 2017-12-14: 5 [IU] via INTRADERMAL
  Filled 2017-12-14: qty 0.1

## 2017-12-14 MED ORDER — POTASSIUM CHLORIDE ER 10 MEQ PO TBCR: 10.0000 meq | EXTENDED_RELEASE_TABLET | Freq: Every day | ORAL | 1 refills | Status: DC

## 2017-12-14 MED ORDER — POLYETHYLENE GLYCOL 3350 17 G PO PACK: 17.0000 g | PACK | Freq: Every day | ORAL | 1 refills | Status: DC | PRN

## 2017-12-14 MED ORDER — ARIPIPRAZOLE 5 MG PO TABS: 5.0000 mg | ORAL_TABLET | Freq: Every day | ORAL | 1 refills | Status: DC

## 2017-12-14 MED ORDER — CETIRIZINE HCL 10 MG PO TABS: 10.0000 mg | ORAL_TABLET | Freq: Every day | ORAL | 1 refills | Status: DC

## 2017-12-14 MED ORDER — ASPIRIN EC 81 MG PO TBEC: 81.0000 mg | DELAYED_RELEASE_TABLET | Freq: Every day | ORAL | 1 refills | Status: DC

## 2017-12-14 MED ORDER — FENOFIBRATE 160 MG PO TABS: 160.0000 mg | ORAL_TABLET | Freq: Every day | ORAL | 1 refills | Status: DC

## 2017-12-14 MED ORDER — SUCRALFATE 1 G PO TABS: 1.0000 g | ORAL_TABLET | Freq: Three times a day (TID) | ORAL | 1 refills | Status: DC

## 2017-12-14 MED ORDER — OXYBUTYNIN CHLORIDE ER 5 MG PO TB24: 5.0000 mg | ORAL_TABLET | Freq: Every day | ORAL | 1 refills | Status: DC

## 2017-12-14 MED ORDER — FOLIC ACID 1 MG PO TABS: 1.0000 mg | ORAL_TABLET | Freq: Every day | ORAL | 1 refills | Status: DC

## 2017-12-14 MED ORDER — PANTOPRAZOLE SODIUM 40 MG PO TBEC: 40.0000 mg | DELAYED_RELEASE_TABLET | Freq: Every day | ORAL | 1 refills | Status: DC

## 2017-12-14 MED ORDER — VITAMIN B-12 1000 MCG PO TABS: 1000.0000 ug | ORAL_TABLET | Freq: Every day | ORAL | 1 refills | Status: DC

## 2017-12-14 NOTE — Progress Notes (Signed)
LCSW consulted with Dr Toni Amendlapacs. He will order TB test and write out patient scripts for patient DC on Sept 2nd/2019. This worker will leave hand off information for coverage SW. The patient [facility] and signed off Fl2.as well.

## 2017-12-14 NOTE — NC FL2 (Signed)
[place] MEDICAID FL2 LEVEL OF CARE SCREENING TOOL     IDENTIFICATION  Patient Name: [name] Birthdate: 11-22-1965 Sex: female Admission Date (Current Location): 11/28/2017  [facility] and [place] Number:  Chiropodist and Address:  [facility], [address], [place], [place] [address]      Provider Number: [id number]  Attending Physician Name and Address:  No att. providers found  Relative Name and Phone Number:       Current Level of Care: Hospital Recommended Level of Care: Family Care Home Prior Approval Number:    Date Approved/Denied:   PASRR Number:    Discharge Plan: Domiciliary (Rest home)    Current Diagnoses: Patient Active Problem List   Diagnosis Date Noted  . Severe recurrent major depression without psychotic features (HCC) 08/10/2016  . Overdose by acetaminophen 08/08/2016  . Schizoaffective disorder, bipolar type (HCC)   . 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 GERD (gastroesophageal reflux disease) 09/24/2014  . COPD (chronic obstructive pulmonary disease) (HCC) 09/24/2014  . Borderline personality disorder (HCC) 09/24/2014  . Cerebral palsy (HCC) 08/31/2014    Orientation RESPIRATION BLADDER Height & Weight     Self, Situation, Time  Normal Continent Weight: 181 lb (82.1 kg) Height:  5\' 3"  (160 cm)  BEHAVIORAL SYMPTOMS/MOOD NEUROLOGICAL BOWEL NUTRITION STATUS      Continent    AMBULATORY STATUS COMMUNICATION OF NEEDS Skin   Independent Verbally Normal                       Personal Care Assistance Level of Assistance              Functional Limitations Info  Sight, Hearing, Speech Sight Info: Adequate Hearing Info: Adequate Speech Info: Adequate    SPECIAL CARE FACTORS FREQUENCY                       Contractures Contractures Info: Not present    Additional Factors Info  Allergies, Code Status Code Status Info: Full code Allergies Info: Penicillians            Current Medications (12/14/2017):  This is the current hospital active medication list Current Facility-Administered Medications  Medication Dose Route Frequency Provider Last Rate Last Dose  . alum & mag hydroxide-simeth (MAALOX/MYLANTA) 200-200-20 MG/5ML suspension 30 mL  30 mL Oral Q8H PRN Minna Antis, MD   30 mL at 12/01/17 1631  . aspirin EC tablet 81 mg  81 mg Oral Daily Minna Antis, MD   81 mg at 12/14/17 0930  . clonazePAM (KLONOPIN) tablet 0.5 mg  0.5 mg Oral BID Minna Antis, MD   0.5 mg at 12/14/17 0931  . divalproex (DEPAKOTE) DR tablet 500 mg  500 mg Oral BID Minna Antis, MD   500 mg at 12/14/17 0930  . escitalopram (LEXAPRO) tablet 20 mg  20 mg Oral Daily Minna Antis, MD   20 mg at 12/14/17 8119  . folic acid (FOLVITE) tablet 1 mg  1 mg Oral Daily Minna Antis, MD   1 mg at 12/14/17 0931  . levothyroxine (SYNTHROID, LEVOTHROID) tablet 50 mcg  50 mcg Oral QAC breakfast Minna Antis, MD   50 mcg at 12/14/17 0929  . oxybutynin (DITROPAN-XL) 24 hr tablet 5 mg  5 mg Oral Daily Minna Antis, MD   5 mg at 12/14/17 0930  . risperiDONE (RISPERDAL) tablet 3 mg  3 mg Oral BID Minna Antis, MD   3 mg at 12/14/17  0930  . tuberculin injection 5 Units  5 Units Intradermal Once Clapacs, John T, MD      . vitamin B-12 (CYANOCOBALAMIN) tablet 1,000 mcg  1,000 mcg Oral Daily Minna AntisPaduchowski, Kevin, MD   1,000 mcg at 12/14/17 [id number]   Current Outpatient Medications  Medication Sig Dispense Refill  . alum & mag hydroxide-simeth (MAALOX/MYLANTA) 200-200-20 MG/5ML suspension Take 30 mLs by mouth every 8 (eight) hours as needed for indigestion or heartburn.    Marland Kitchen. aspirin EC 81 MG tablet Take 81 mg by mouth daily.    . cetirizine (ZYRTEC) 10 MG tablet Take 10 mg by mouth daily.    . clonazePAM (KLONOPIN) 0.5 MG tablet Take 0.5 mg by mouth 2 (two) times daily.     . divalproex (DEPAKOTE) 500 MG DR tablet Take 500 mg by mouth 2 (two) times daily.     Marland Kitchen.  escitalopram (LEXAPRO) 20 MG tablet Take 20 mg by mouth daily.     . Fluticasone-Salmeterol (ADVAIR) 250-50 MCG/DOSE AEPB Inhale 1 puff into the lungs 2 (two) times daily.     . folic acid (FOLVITE) 1 MG tablet Take 1 mg by mouth daily.    Marland Kitchen. levothyroxine (SYNTHROID, LEVOTHROID) 50 MCG tablet Take 50 mcg by mouth daily before breakfast.    . Menthol-Methyl Salicylate (MUSCLE RUB) 10-15 % CREA Apply 1 application topically every 6 (six) hours as needed for muscle pain.    . nitrofurantoin, macrocrystal-monohydrate, (MACROBID) 100 MG capsule Take 100 mg by mouth 2 (two) times daily.    Marland Kitchen. oxybutynin (DITROPAN-XL) 5 MG 24 hr tablet Take 5 mg by mouth daily.    . polyethylene glycol (MIRALAX / GLYCOLAX) packet Take 17 g by mouth daily as needed.    . risperiDONE (RISPERDAL) 3 MG tablet Take 3 mg by mouth 2 (two) times daily.    . vitamin B-12 (CYANOCOBALAMIN) 1000 MCG tablet Take 1,000 mcg by mouth daily.    Marland Kitchen. amantadine (SYMMETREL) 100 MG capsule Take 1 capsule (100 mg total) by mouth 2 (two) times daily. (Patient not taking: Reported on 11/28/2017) 60 capsule 0  . ARIPiprazole (ABILIFY) 5 MG tablet Take 5 mg by mouth daily.    Marland Kitchen. azithromycin (ZITHROMAX Z-PAK) 250 MG tablet Take 2 tablets (500 mg) on  Day 1,  followed by 1 tablet (250 mg) once daily on Days 2 through 5. (Patient not taking: Reported on 11/28/2017) 6 each 0  . benzonatate (TESSALON PERLES) 100 MG capsule Take 1 capsule (100 mg total) by mouth 3 (three) times daily as needed for cough. (Patient not taking: Reported on 11/28/2017) 30 capsule 0  . fenofibrate 160 MG tablet Take 160 mg by mouth daily.    . furosemide (LASIX) 20 MG tablet Take 20 mg by mouth daily.    Marland Kitchen. HYDROcodone-acetaminophen (NORCO/VICODIN) 5-325 MG tablet Take 1 tablet by mouth every 4 (four) hours as needed. (Patient not taking: Reported on 05/02/2017) 12 tablet 0  . hydrocortisone 2.5 % cream Apply 1 application topically 2 (two) times daily.    Marland Kitchen. loperamide (IMODIUM) 2  MG capsule Take 1 capsule (2 mg total) by mouth 4 (four) times daily as needed for diarrhea or loose stools. (Patient not taking: Reported on 11/28/2017) 12 capsule 0  . Omega-3 Fatty Acids (FISH OIL PO) Take 1 capsule by mouth daily.    . pantoprazole (PROTONIX) 40 MG tablet Take 40 mg by mouth daily.     . potassium chloride (K-DUR) 10 MEQ tablet Take 10 mEq by  mouth daily.    . sucralfate (CARAFATE) 1 g tablet Take 1 g by mouth 4 (four) times daily -  with meals and at bedtime.       Discharge Medications: Please see discharge summary for a list of discharge medications.  Relevant Imaging Results:  Relevant Lab Results:   Additional Information SSN [id number]  Cheron Schaumann, [place]

## 2017-12-14 NOTE — ED Provider Notes (Signed)
-----------------------------------------   7:52 AM on 12/14/2017 -----------------------------------------   Blood pressure 110/68, pulse 65, temperature (!) 97.4 F (36.3 C), temperature source Oral, resp. rate 16, height 5\' 3"  (1.6 m), weight 82.1 kg, SpO2 100 %.  The patient had no acute events since last update.  Calm and cooperative at this time.  Disposition is pending Psychiatry/Behavioral Medicine team recommendations.     Irean HongSung, Lamika Connolly J, MD 12/14/17 [phone]

## 2017-12-14 NOTE — Progress Notes (Signed)
LCSW [facility]ed fl2 [facility] ( fax #) [phone] Phone Number [phone]  LCSW placed all original scripts and Fl2 in patient [facility]. All original to go to Group home provider.  LCSW will have TB test result printed on Sunday to be attached to patient DC paperwork for group home provider.  In discussion with [facility] she will pick up patient at 3pm on Monday Sept 2nd/2019  Kingmanlaudine Auna Mikkelsen LCSW [phone]

## 2017-12-14 NOTE — ED Notes (Signed)
Pt is washing up in the bathroom.  Maintained on 15 minute checks and observation by security camera for safety.

## 2017-12-14 NOTE — ED Notes (Signed)
Pt's linen changed / bed cleaned.

## 2017-12-14 NOTE — ED Notes (Addendum)
Pt. In dayroom while this Clinical research associatewriter talking to patient.  Patient states she is looking forward to going to new group home come Monday or Tuesday.  Pt. Has no concerns or questions at this time.  Pt. Requested and was given snack tray and drink.  Pt. Currently denies SI or HI.

## 2017-12-14 NOTE — Progress Notes (Signed)
LCSW received a call from [place] at [facility]- inquiring on patient disposition. LCSW   Plan is for [facility] to [facility]Monday [facility]/19[facility] [phone]  Information was shared and [facility] patient has no further needs and is awaiting DC on September 2nd.  Delta Air LinesClaudine Denesia Donelan LCSW [phone]

## 2017-12-14 NOTE — Consult Note (Signed)
[facility] Face-to-Face Psychiatry Consult   Reason for Consult: Follow-up for this 52 year old woman with developmental disability and behavior problems Referring Physician: Michell Heinrich [name] MRN:  [id number] Principal Diagnosis: Borderline personality disorder [facility]) Diagnosis:   Patient Active Problem List   Diagnosis Date Noted  . Severe recurrent major depression without psychotic features (HCC) [F33.2] 08/10/2016  . Overdose by acetaminophen [T39.1X1A] 08/08/2016  . Schizoaffective disorder, bipolar type (HCC) [F25.0]   . 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 GERD (gastroesophageal reflux disease) [K21.9] 09/24/2014  . COPD (chronic obstructive pulmonary disease) (HCC) [J44.9] 09/24/2014  . Borderline personality disorder (HCC) [F60.3] 09/24/2014  . Cerebral palsy (HCC) [G80.9] 08/31/2014    Total Time spent with patient: 30 minutes  Subjective:   [name] is a 52 y.o. female patient admitted with "I want to go".  HPI: Patient seen chart reviewed.  Patient has been calm and has no new complaints.  Mood is stable.  No behavioral outburst.  No physical complaints.  Patient has been waiting for appropriate disposition.  Social work informs me that disposition is likely to occur on Monday or Tuesday of this week.  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 Anxiety   . Asthma   . Borderline personality disorder (HCC)   . Cerebral palsy (HCC)   . COPD (chronic obstructive pulmonary disease) (HCC)   . Depression   . GERD (gastroesophageal reflux disease)   . Mild cognitive impairment   . Schizoaffective disorder (HCC)   . Wound abscess     Past Surgical  History:  Procedure Laterality Date  . CHOLECYSTECTOMY    . INCISION AND DRAINAGE ABSCESS Right 01/02/2015   Procedure: INCISION AND DRAINAGE ABSCESS;  Surgeon: Tiney Rouge III, MD;  Location: [facility] ORS;  Service: General;  Laterality: Right;  . TONSILLECTOMY     Family History:  Family History  Problem Relation Age of Onset  . Heart attack Father   . Diabetes Mother    Family Psychiatric  History: Unknown Social History:  Social History   Substance and Sexual Activity  Alcohol Use No     Social History   Substance and Sexual Activity  Drug Use No    Social History   Socioeconomic History  . Marital status: Single    Spouse name: Not on file  . Number of children: Not on file  . Years of education: Not on file  . Highest education level: Not on file  Occupational History  . Not on file  Social Needs  . Financial resource strain: Not on file  . Food insecurity:    Worry: Not on file    Inability: Not on file  . Transportation needs:    Medical: Not on file    Non-medical: Not on file  Tobacco Use  . Smoking status: Never Smoker  . Smokeless tobacco: Never Used  Substance and Sexual Activity  . Alcohol use: No  . Drug use: No  . Sexual activity: Not Currently    Birth control/protection: Implant  Lifestyle  . Physical activity:    Days per week: Not on file    Minutes per session: Not on file  . Stress: Not on file  Relationships  .  Social connections:    Talks on phone: Not on file    Gets together: Not on file    Attends religious service: Not on file    Active member of club or organization: Not on file    Attends meetings of clubs or organizations: Not on file    Relationship status: Not on file  Other Topics Concern  . Not on file  Social History Narrative  . Not on file   Additional Social History:    Allergies:   Allergies  Allergen Reactions  . Penicillins Nausea And Vomiting and Other (See Comments)    Reaction:  No  Has patient had a  PCN reaction causing immediate rash, facial/tongue/throat swelling, SOB or lightheadedness with hypotension: No Has patient had a PCN reaction causing severe rash involving mucus membranes or skin necrosis: No Has patient had a PCN reaction that required hospitalization No Has patient had a PCN reaction occurring within the last 10 years: No If all of the above answers are "NO", then may proceed with Cephalosporin use.     Labs: No results found for this or any previous visit (from the past 48 hour(s)).  Current Facility-Administered Medications  Medication Dose Route Frequency Provider Last Rate Last Dose  . alum & mag hydroxide-simeth (MAALOX/MYLANTA) 200-200-20 MG/5ML suspension 30 mL  30 mL Oral Q8H PRN Minna Antis, MD   30 mL at 12/01/17 1631  . aspirin EC tablet 81 mg  81 mg Oral Daily Minna Antis, MD   81 mg at 12/14/17 0930  . clonazePAM (KLONOPIN) tablet 0.5 mg  0.5 mg Oral BID Minna Antis, MD   0.5 mg at 12/14/17 0931  . divalproex (DEPAKOTE) DR tablet 500 mg  500 mg Oral BID Minna Antis, MD   500 mg at 12/14/17 0930  . escitalopram (LEXAPRO) tablet 20 mg  20 mg Oral Daily Minna Antis, MD   20 mg at 12/14/17 9604  . folic acid (FOLVITE) tablet 1 mg  1 mg Oral Daily Minna Antis, MD   1 mg at 12/14/17 0931  . levothyroxine (SYNTHROID, LEVOTHROID) tablet 50 mcg  50 mcg Oral QAC breakfast Minna Antis, MD   50 mcg at 12/14/17 0929  . oxybutynin (DITROPAN-XL) 24 hr tablet 5 mg  5 mg Oral Daily Minna Antis, MD   5 mg at 12/14/17 0930  . risperiDONE (RISPERDAL) tablet 3 mg  3 mg Oral BID Minna Antis, MD   3 mg at 12/14/17 0930  . tuberculin injection 5 Units  5 Units Intradermal Once Theodore Virgin T, MD      . vitamin B-12 (CYANOCOBALAMIN) tablet 1,000 mcg  1,000 mcg Oral Daily Minna Antis, MD   1,000 mcg at 12/14/17 5409   Current Outpatient Medications  Medication Sig Dispense Refill  . alum & mag hydroxide-simeth  (MAALOX/MYLANTA) 200-200-20 MG/5ML suspension Take 30 mLs by mouth every 8 (eight) hours as needed for indigestion or heartburn. 355 mL 1  . ARIPiprazole (ABILIFY) 5 MG tablet Take 1 tablet (5 mg total) by mouth daily. 30 tablet 1  . aspirin EC 81 MG tablet Take 1 tablet (81 mg total) by mouth daily. 30 tablet 1  . cetirizine (ZYRTEC) 10 MG tablet Take 1 tablet (10 mg total) by mouth daily. 30 tablet 1  . clonazePAM (KLONOPIN) 0.5 MG tablet Take 1 tablet (0.5 mg total) by mouth 2 (two) times daily. 60 tablet 1  . divalproex (DEPAKOTE) 500 MG DR tablet Take 1 tablet (500 mg total) by mouth  2 (two) times daily. 60 tablet 1  . escitalopram (LEXAPRO) 20 MG tablet Take 1 tablet (20 mg total) by mouth daily. 30 tablet 1  . fenofibrate 160 MG tablet Take 1 tablet (160 mg total) by mouth daily. 30 tablet 1  . Fluticasone-Salmeterol (ADVAIR) 250-50 MCG/DOSE AEPB Inhale 1 puff into the lungs 2 (two) times daily. 60 each 1  . folic acid (FOLVITE) 1 MG tablet Take 1 tablet (1 mg total) by mouth daily. 30 tablet 1  . furosemide (LASIX) 20 MG tablet Take 1 tablet (20 mg total) by mouth daily. 30 tablet 1  . levothyroxine (SYNTHROID, LEVOTHROID) 50 MCG tablet Take 1 tablet (50 mcg total) by mouth daily before breakfast. 30 tablet 1  . oxybutynin (DITROPAN-XL) 5 MG 24 hr tablet Take 1 tablet (5 mg total) by mouth daily. 30 tablet 1  . pantoprazole (PROTONIX) 40 MG tablet Take 1 tablet (40 mg total) by mouth daily. 30 tablet 1  . polyethylene glycol (MIRALAX / GLYCOLAX) packet Take 17 g by mouth daily as needed. 30 each 1  . potassium chloride (K-DUR) 10 MEQ tablet Take 1 tablet (10 mEq total) by mouth daily. 30 tablet 1  . risperiDONE (RISPERDAL) 3 MG tablet Take 1 tablet (3 mg total) by mouth 2 (two) times daily. 60 tablet 1  . sucralfate (CARAFATE) 1 g tablet Take 1 tablet (1 g total) by mouth 4 (four) times daily -  with meals and at bedtime. 120 tablet 1  . vitamin B-12 (CYANOCOBALAMIN) 1000 MCG tablet Take 1  tablet (1,000 mcg total) by mouth daily. 30 tablet 1    Musculoskeletal: Strength & Muscle Tone: within normal limits Gait & Station: normal Patient leans: N/A  Psychiatric Specialty Exam: Physical Exam  Nursing note and vitals reviewed. Constitutional: She appears well-developed and well-nourished.  HENT:  Head: Normocephalic and atraumatic.  Eyes: Pupils are equal, round, and reactive to light. Conjunctivae are normal.  Neck: Normal range of motion.  Cardiovascular: Regular rhythm and normal heart sounds.  Respiratory: Effort normal.  GI: Soft.  Musculoskeletal: Normal range of motion.  Neurological: She is alert.  Skin: Skin is warm and dry.  Psychiatric: Her speech is normal. Her affect is blunt. She is slowed. Thought content is not paranoid. Cognition and memory are impaired. She expresses inappropriate judgment. She expresses no homicidal and no suicidal ideation. She exhibits abnormal recent memory.    Review of Systems  Constitutional: Negative.   HENT: Negative.   Eyes: Negative.   Respiratory: Negative.   Cardiovascular: Negative.   Gastrointestinal: Negative.   Musculoskeletal: Negative.   Skin: Negative.   Neurological: Negative.   Psychiatric/Behavioral: Negative.     Blood pressure (!) 101/54, pulse 76, temperature (!) 97.4 F (36.3 C), temperature source Oral, resp. rate 18, height 5\' 3"  (1.6 m), weight 82.1 kg, SpO2 98 %.Body mass index is 32.06 kg/m.  General Appearance: Fairly Groomed  Eye Contact:  Fair  Speech:  Clear and Coherent  Volume:  Normal  Mood:  Euthymic  Affect:  Constricted  Thought Process:  Goal Directed  Orientation:  Full (Time, Place, and Person)  Thought Content:  Logical  Suicidal Thoughts:  No  Homicidal Thoughts:  No  Memory:  Immediate;   Fair Recent;   Fair Remote;   Fair  Judgement:  Fair  Insight:  Fair  Psychomotor Activity:  Normal  Concentration:  Concentration: Fair  Recall:  [employer] of Knowledge:  Fair   Language:  Fair  Akathisia:  No  Handed:  Right  AIMS (if indicated):     Assets:  Desire for Improvement Housing  ADL's:  Intact  Cognition:  Impaired,  Mild  Sleep:        Treatment Plan Summary: Medication management and Plan Patient is stable and tolerating medication well.  She is cooperative and asymptomatic.  Looking forward to likely discharge on Monday.  I have ordered a tuberculin test to be done which will be read before the end of the weekend.  I have printed out prescriptions for her.  FL 2 will be signed and hopefully everything will be in place when it is time for discharge.  Disposition: No evidence of imminent risk to self or others at present.   Patient does not meet criteria for psychiatric inpatient admission.  Mordecai RasmussenJohn Zylpha Poynor, MD 12/14/2017 3:31 PM

## 2017-12-14 NOTE — ED Notes (Signed)
EKG completed

## 2017-12-14 NOTE — ED Notes (Addendum)
Pt complaining of chest and neck pain. EDP made aware. VS stable. RN to obtain EKG.   Maintained on 15 minute checks and observation by security camera for safety.

## 2017-12-14 NOTE — ED Notes (Signed)
Voluntary/ Pending Pick-up on Monday

## 2017-12-14 NOTE — ED Provider Notes (Signed)
-----------------------------------------   6:46 PM on 12/14/2017 -----------------------------------------  EKG shows normal sinus rhythm at 62 bpm with a narrow QRS, normal axis, normal intervals, nonspecific without concerning ST changes occasional PVC.  Patient was complaining of mild chest discomfort earlier tonight.  Patient is well-known to the ED and myself, EKG is reassuring we will continue to closely monitor.   Minna AntisPaduchowski, Rendell Thivierge, MD 12/14/17 [phone]

## 2017-12-15 DIAGNOSIS — F259 Schizoaffective disorder, unspecified: Secondary | ICD-10-CM | POA: Diagnosis not present

## 2017-12-15 NOTE — ED Notes (Signed)
Report to include Situation, Background, Assessment, and Recommendations received from RN Amy. Patient alert and oriented, warm and dry, in no acute distress. Patient reported AVH and denies SI, HI and pain. Patient made aware of Q15 minutes and hourly rounds for the safety.  Patient instructed to come to me with needs or concerns.

## 2017-12-15 NOTE — ED Notes (Signed)
Hourly rounding reveals patient in room. No complaints, stable, in no acute distress. Q15 minute rounds and monitoring via Security Cameras to continue. 

## 2017-12-15 NOTE — ED Notes (Signed)
Patient is alert and oriented to person and place.  Denies suicidal thoughts, auditory and visual hallucinations.  Presents with irritable affect and mood.  Verbally aggressive and abusive towards staff when redirected.  Patient continues to be incontinent of urine and faeces.  Patient encouraged to take shower after defecating and urinating on self but refused.  Needed a lot of prompting and show of force before she finally agreed to take shower.  Routine safety checks maintained every 15 minutes.  Patient is safe on the unit.

## 2017-12-15 NOTE — ED Notes (Signed)
Pt stated she is no longer supposed to be taking lasix.  [facility]."   RN will inform EDP and have medications reviewed by pharmacy.   Maintained on 15 minute checks and observation by security camera for safety.

## 2017-12-15 NOTE — ED Notes (Signed)
Snack and beverage given. 

## 2017-12-15 NOTE — ED Provider Notes (Signed)
-----------------------------------------   6:41 AM on 12/15/2017 -----------------------------------------   Blood pressure 120/68, pulse 67, temperature 98.4 F (36.9 C), temperature source Oral, resp. rate 14, height 5\' 3"  (1.6 m), weight 82.1 kg, SpO2 99 %.  The patient had no acute events since last update.  Calm and cooperative at this time.  Disposition is pending Psychiatry/Behavioral Medicine team recommendations.       Irean HongSung, Kewan Mcnease J, MD 12/15/17 [phone]

## 2017-12-15 NOTE — ED Notes (Signed)
Pt not wanting to dress herself after episodes of incontinence.  Staff explained to patient she MUST be able to dress herself to live in new group home.    Maintained on 15 minute checks and observation by security camera for safety.

## 2017-12-16 DIAGNOSIS — F259 Schizoaffective disorder, unspecified: Secondary | ICD-10-CM | POA: Diagnosis not present

## 2017-12-16 MED ORDER — ACETAMINOPHEN 325 MG PO TABS
650.0000 mg | ORAL_TABLET | Freq: Once | ORAL | Status: DC
Start: 2017-12-16 — End: 2017-12-17
  Filled 2017-12-16: qty 2

## 2017-12-16 NOTE — ED Notes (Signed)
Hourly rounding reveals patient in room. No complaints, stable, in no acute distress. Q15 minute rounds and monitoring via Security Cameras to continue. 

## 2017-12-16 NOTE — ED Notes (Signed)
Report to include Situation, Background, Assessment, and Recommendations received from [facility]. Patient alert and oriented, warm and dry, in no acute distress. Patient denies HI, VH and pain. Patient states she still has SI without plan and hearing voices to "kill myself.  Patient made aware of Q15 minute rounds and security cameras for their safety. Patient instructed to come to me with needs or concerns.

## 2017-12-16 NOTE — ED Notes (Signed)
BEHAVIORAL HEALTH ROUNDING Patient sleeping: No. Patient alert and oriented: yes Behavior appropriate: Yes.  ; If no, describe:  Nutrition and fluids offered: yes Toileting and hygiene offered: Yes  Sitter present: q15 minute observations and security monitoring Law enforcement present: Yes    

## 2017-12-16 NOTE — ED Notes (Signed)
Hourly rounding reveals patient sleeping in room. No complaints, stable, in no acute distress. Q15 minute rounds and monitoring via Security Cameras to continue. 

## 2017-12-16 NOTE — ED Notes (Signed)
Pt. C/o various somatic symptoms that are not supported by associated behaviors. Historically previous c/o have been addressed by EDP without need for treatment. Patient in no acute distress.

## 2017-12-16 NOTE — ED Provider Notes (Signed)
-----------------------------------------   8:06 AM on 12/16/2017 -----------------------------------------   Blood pressure 106/68, pulse 72, temperature 98.9 F (37.2 C), temperature source Oral, resp. rate 19, height 5\' 3"  (1.6 m), weight 82.1 kg, SpO2 100 %.  The patient had no acute events since last update.  Calm and cooperative at this time.  Disposition is pending Psychiatry/Behavioral Medicine team recommendations.     Sharyn Creamer, MD 12/16/17 [phone]

## 2017-12-16 NOTE — ED Notes (Signed)
Pt requesting snack, this tech honored request and provided pt saltine crackers, peanut butter, and a sprite. Pt has no further request at this time, will continue to do q 15 minute checks

## 2017-12-16 NOTE — ED Notes (Signed)
Hourly rounding reveals patient in day room. No complaints, stable, in no acute distress. Q15 minute rounds and monitoring via Security Cameras to continue. 

## 2017-12-16 NOTE — Progress Notes (Signed)
LCSW spoke to ED secretary who called BHU nurse to see if TB test had been read. It is scheduled to be read at 6:30 pm and B[facility] nurse will print a copy ( note for TB test) and will place it on patient records/files in ED  [facility] and prescriptions and new Fl2 have already been sent to Pharmacy all of these documents in patient clipboard in ED. Please make sure copy of TB result letter goes with DC package and scripts and Kendall Flack LCSW [phone]

## 2017-12-16 NOTE — ED Notes (Signed)
Spoke with patient and patient discussed with Clinical research associate that she was nervous about leaving, she said she scared her new place may not take her because she urinated in bed, and patient said im ready to leave, I been here to long. Informed patient that, the medication Lasix has been stopped and hopefully this will assist in her urinating in the bed.

## 2017-12-16 NOTE — ED Notes (Signed)
Voluntary/ pending placement 

## 2017-12-16 NOTE — ED Notes (Addendum)
Read patients PPD test on left forearm and the test read negative. No redness, swelling, or tenderness to area

## 2017-12-17 DIAGNOSIS — F259 Schizoaffective disorder, unspecified: Secondary | ICD-10-CM | POA: Diagnosis not present

## 2017-12-17 NOTE — ED Notes (Signed)
Hourly rounding reveals patient sleeping in room. No complaints, stable, in no acute distress. Q15 minute rounds and monitoring via Security Cameras to continue. 

## 2017-12-17 NOTE — ED Notes (Signed)
BEHAVIORAL HEALTH ROUNDING Patient sleeping: No. Patient alert and oriented: yes Behavior appropriate: Yes.  ; If no, describe:  Nutrition and fluids offered: yes Toileting and hygiene offered: Yes  Sitter present: q15 minute observations and security monitoring Law enforcement present: Yes    

## 2017-12-17 NOTE — ED Provider Notes (Signed)
-----------------------------------------   7:43 AM on 12/17/2017 -----------------------------------------   Blood pressure (!) 105/59, pulse 81, temperature 97.9 F (36.6 C), temperature source Oral, resp. rate 18, height 5\' 3"  (1.6 m), weight 82.1 kg, SpO2 97 %.  The patient had no acute events since last update.  Calm and cooperative at this time.  Social worker currently working with patient to help with placement.      Minna Antis, MD 12/17/17 [phone]

## 2017-12-17 NOTE — ED Notes (Signed)
Patient discharged with [name], patient received discharge papers and prescription. Patient received belongings and verbalized she has received all of her belongings. Patient appropriate and cooperative, Denies SI/HI AVH. Vital signs taken. NAD noted.

## 2017-12-17 NOTE — Clinical Social Work Note (Signed)
CSW received call from ED RN regarding patient discharge today. CSW contacted [facility] [phone] regarding discharge today. [facility] states that she is picking up patient around 3pm today and only needs printed FL2. Per [facility] patient is not required to have a TB test. CSW notified patient's RN of above. RN will have Teacher, music FL2 for patient. Patient will be picked up around 3pm today.   Ruthe Mannan MSW, [address] [phone]

## 2018-01-09 ENCOUNTER — Other Ambulatory Visit: Payer: Self-pay | Admitting: Psychiatry

## 2018-01-25 ENCOUNTER — Ambulatory Visit (INDEPENDENT_AMBULATORY_CARE_PROVIDER_SITE_OTHER): Payer: Medicare Other | Admitting: Obstetrics and Gynecology

## 2018-01-25 ENCOUNTER — Other Ambulatory Visit (HOSPITAL_COMMUNITY)
Admission: RE | Admit: 2018-01-25 | Discharge: 2018-01-25 | Disposition: A | Discharge status: Home / Self Care | Payer: Medicare Other | Source: Ambulatory Visit | Attending: Obstetrics and Gynecology | Admitting: Obstetrics and Gynecology

## 2018-01-25 ENCOUNTER — Encounter: Payer: Self-pay | Admitting: Obstetrics and Gynecology

## 2018-01-25 VITALS — BP 126/68 | HR 92 | Ht 63.0 in | Wt 192.0 lb

## 2018-01-25 DIAGNOSIS — B9689 Other specified bacterial agents as the cause of diseases classified elsewhere: Secondary | ICD-10-CM | POA: Diagnosis not present

## 2018-01-25 DIAGNOSIS — Z1239 Encounter for other screening for malignant neoplasm of breast: Secondary | ICD-10-CM

## 2018-01-25 DIAGNOSIS — Z113 Encounter for screening for infections with a predominantly sexual mode of transmission: Secondary | ICD-10-CM | POA: Diagnosis present

## 2018-01-25 DIAGNOSIS — N911 Secondary amenorrhea: Secondary | ICD-10-CM | POA: Diagnosis not present

## 2018-01-25 DIAGNOSIS — N760 Acute vaginitis: Secondary | ICD-10-CM

## 2018-01-25 DIAGNOSIS — Z124 Encounter for screening for malignant neoplasm of cervix: Secondary | ICD-10-CM | POA: Insufficient documentation

## 2018-01-25 MED ORDER — METRONIDAZOLE 500 MG PO TABS: 500.0000 mg | ORAL_TABLET | Freq: Two times a day (BID) | ORAL | 0 refills | Status: AC

## 2018-01-25 MED ORDER — ZINC OXIDE 40 % EX OINT: 1.0000 "application " | TOPICAL_OINTMENT | CUTANEOUS | 0 refills | Status: DC | PRN

## 2018-01-25 NOTE — Progress Notes (Signed)
Obstetrics & Gynecology Office Visit   Chief Complaint:  Chief Complaint  Patient presents with  . Vaginitis    Discharge w/odor  . Urinary Tract Infection    Painful urination  . Anxiety/Depression    Suicidal thoughts    History of Present Illness: [name] is a 52 y.o. G0P0000 who LMP was No LMP recorded (approximate). Patient has had an implant., presents today for a problem visit.   Patient complains of an abnormal vaginal discharge for 3 week. Discharge described as: thin and malodorous. Vaginal symptoms include local irritation.   Other associated symptoms: none.Menstrual pattern: She had been bleeding none. Contraception: Nexplanon.  She denies recent antibiotic exposure, denies changes in soaps, detergents coinciding with the onset of her symptoms.  She has not previously self treated or been under treatment by another provider for these symptoms.   1) Risk factors for bacterial vaginosis and candida infections discussed.  We discussed normal vaginal flora/microbiome.  Any factors that may alter the microbiome increase the risk of these opportunistic infections.  These include changes in pH, antibiotic exposures, diabetes, wet bathing suits etc.  We discussed that treatment is aimed at eradicating abnormal bacterial overgrowth and or yeast.  There may be some role for vaginal probiotics in restoring normal vaginal flora.      Review of Systems: Review of Systems  Constitutional: Positive for malaise/fatigue. Negative for chills, fever and weight loss.  HENT: Negative.   Eyes: Negative.   Respiratory: Positive for cough and shortness of breath. Negative for hemoptysis, sputum production and wheezing.   Cardiovascular: Positive for chest pain. Negative for palpitations, orthopnea, claudication and PND.  Gastrointestinal: Positive for abdominal pain.  Genitourinary: Positive for dysuria and urgency.  Skin: Positive for itching and rash.  Endo/Heme/Allergies:  Negative.   Psychiatric/Behavioral: Positive for depression and suicidal ideas. The patient is nervous/anxious.      Past Medical History:  Past Medical History:  Diagnosis Date  . Anxiety   . Asthma   . Borderline personality disorder (HCC)   . Cerebral palsy (HCC)   . COPD (chronic obstructive pulmonary disease) (HCC)   . Depression   . GERD (gastroesophageal reflux disease)   . Mild cognitive impairment   . Schizoaffective disorder (HCC)   . Wound abscess     Past Surgical History:  Past Surgical History:  Procedure Laterality Date  . CHOLECYSTECTOMY    . INCISION AND DRAINAGE ABSCESS Right 01/02/2015   Procedure: INCISION AND DRAINAGE ABSCESS;  Surgeon: Tiney Rouge III, MD;  Location: [facility] ORS;  Service: General;  Laterality: Right;  . TONSILLECTOMY      Gynecologic History: No LMP recorded (approximate). Patient has had an implant.  Obstetric History: G0P0000  Family History:  Family History  Problem Relation Age of Onset  . Heart attack Father   . Diabetes Mother     Social History:  Social History   Socioeconomic History  . Marital status: Single    Spouse name: Not on file  . Number of children: Not on file  . Years of education: Not on file  . Highest education level: Not on file  Occupational History  . Not on file  Social Needs  . Financial resource strain: Not on file  . Food insecurity:    Worry: Not on file    Inability: Not on file  . Transportation needs:    Medical: Not on file    Non-medical: Not on file  Tobacco Use  .  Smoking status: Never Smoker  . Smokeless tobacco: Never Used  Substance and Sexual Activity  . Alcohol use: No  . Drug use: No  . Sexual activity: Not Currently    Birth control/protection: Implant  Lifestyle  . Physical activity:    Days per week: Not on file    Minutes per session: Not on file  . Stress: Not on file  Relationships  . Social connections:    Talks on phone: Not on file    Gets together: Not on  file    Attends religious service: Not on file    Active member of club or organization: Not on file    Attends meetings of clubs or organizations: Not on file    Relationship status: Not on file  . Intimate partner violence:    Fear of current or ex partner: Not on file    Emotionally abused: Not on file    Physically abused: Not on file    Forced sexual activity: Not on file  Other Topics Concern  . Not on file  Social History Narrative  . Not on file    Allergies:  Allergies  Allergen Reactions  . Penicillins Nausea And Vomiting and Other (See Comments)    Reaction:  No  Has patient had a PCN reaction causing immediate rash, facial/tongue/throat swelling, SOB or lightheadedness with hypotension: No Has patient had a PCN reaction causing severe rash involving mucus membranes or skin necrosis: No Has patient had a PCN reaction that required hospitalization No Has patient had a PCN reaction occurring within the last 10 years: No If all of the above answers are "NO", then may proceed with Cephalosporin use.     Medications: Prior to Admission medications   Medication Sig Start Date End Date Taking? Authorizing Provider  alum & mag hydroxide-simeth (MAALOX/MYLANTA) 200-200-20 MG/5ML suspension Take 30 mLs by mouth every 8 (eight) hours as needed for indigestion or heartburn. 12/14/17  Yes Clapacs, Jackquline Denmark, MD  ARIPiprazole (ABILIFY) 5 MG tablet Take 1 tablet (5 mg total) by mouth daily. 12/14/17  Yes Clapacs, Jackquline Denmark, MD  aspirin EC 81 MG tablet Take 1 tablet (81 mg total) by mouth daily. 12/14/17  Yes Clapacs, Jackquline Denmark, MD  cetirizine (ZYRTEC) 10 MG tablet Take 1 tablet (10 mg total) by mouth daily. 12/14/17  Yes Clapacs, Jackquline Denmark, MD  clonazePAM (KLONOPIN) 0.5 MG tablet Take 1 tablet (0.5 mg total) by mouth 2 (two) times daily. 12/14/17  Yes Clapacs, Jackquline Denmark, MD  divalproex (DEPAKOTE) 500 MG DR tablet Take 1 tablet (500 mg total) by mouth 2 (two) times daily. 12/14/17  Yes Clapacs, Jackquline Denmark, MD   escitalopram (LEXAPRO) 20 MG tablet Take 1 tablet (20 mg total) by mouth daily. 12/14/17  Yes Clapacs, Jackquline Denmark, MD  fenofibrate 160 MG tablet Take 1 tablet (160 mg total) by mouth daily. 12/14/17  Yes Clapacs, Jackquline Denmark, MD  Fluticasone-Salmeterol (ADVAIR) 250-50 MCG/DOSE AEPB Inhale 1 puff into the lungs 2 (two) times daily. 12/14/17  Yes Clapacs, Jackquline Denmark, MD  folic acid (FOLVITE) 1 MG tablet Take 1 tablet (1 mg total) by mouth daily. 12/14/17  Yes Clapacs, Jackquline Denmark, MD  furosemide (LASIX) 20 MG tablet Take 1 tablet (20 mg total) by mouth daily. 12/14/17  Yes Clapacs, Jackquline Denmark, MD  levothyroxine (SYNTHROID, LEVOTHROID) 50 MCG tablet Take 1 tablet (50 mcg total) by mouth daily before breakfast. 12/14/17  Yes Clapacs, Jackquline Denmark, MD  oxybutynin (DITROPAN-XL) 5 MG 24 hr  tablet Take 1 tablet (5 mg total) by mouth daily. 12/14/17  Yes Clapacs, Jackquline Denmark, MD  pantoprazole (PROTONIX) 40 MG tablet Take 1 tablet (40 mg total) by mouth daily. 12/14/17  Yes Clapacs, Jackquline Denmark, MD  polyethylene glycol (MIRALAX / GLYCOLAX) packet Take 17 g by mouth daily as needed. 12/14/17  Yes Clapacs, Jackquline Denmark, MD  potassium chloride (K-DUR) 10 MEQ tablet Take 1 tablet (10 mEq total) by mouth daily. 12/14/17  Yes Clapacs, Jackquline Denmark, MD  risperiDONE (RISPERDAL) 3 MG tablet Take 1 tablet (3 mg total) by mouth 2 (two) times daily. 12/14/17  Yes Clapacs, Jackquline Denmark, MD  sucralfate (CARAFATE) 1 g tablet Take 1 tablet (1 g total) by mouth 4 (four) times daily -  with meals and at bedtime. 12/14/17  Yes Clapacs, Jackquline Denmark, MD  vitamin B-12 (CYANOCOBALAMIN) 1000 MCG tablet Take 1 tablet (1,000 mcg total) by mouth daily. 12/14/17  Yes Clapacs, Jackquline Denmark, MD    Physical Exam Vitals:  Vitals:   01/25/18 1513  BP: 126/68  Pulse: 92   No LMP recorded (approximate). Patient has had an implant.  General: NAD HEENT: normocephalic, anicteric Pulmonary: No increased work of breathing Genitourinary:  External: External genitalia with erythema.  Normal urethral meatus, normal  Bartholin's and Skene's glands.    Vagina: Normal vaginal mucosa, no evidence of prolapse.    Cervix: Grossly normal in appearance, no bleeding  Rectal: deferred  Lymphatic: no evidence of inguinal lymphadenopathy Neurologic: Grossly intact   Female chaperone present for pelvic  portions of the physical exam  Assessment: 52 y.o. G0P0000 with acute vaginitis onset 3 weeks ago with odor and irrigation   Plan: Problem List Items Addressed This Visit    None    Visit Diagnoses    Routine screening for STI (sexually transmitted infection)    -  Primary   Relevant Orders   Cytology - PAP   Screening for malignant neoplasm of cervix       Relevant Orders   Cytology - PAP   Acute vaginitis       Relevant Orders   NuSwab BV and Candida, NAA   Secondary amenorrhea       Relevant Orders   FSH   Estradiol     1) Nexlanon 05/04/2014 needs replacement in the next few months prior to January.  FDA approved for 3 years of use but data supporting up to 4 years of use. - given average age of menopause in [place] is 7 will check FSH/estradiol if in postmenopausal range I would recommend removal without re-insertion  2) Cervical cancer screening - pap collected today  3) Vaginal odor - nuswab VG+ sent - suspect large contributing factor is incontinence - Clue cells on wet mount Rx for flagyl 500mg  bid for 7 day course - Recommend desitin use to prevent skin breakdown  4) Patient reports SI today under care of psychiatrist recommend evaluation or ER evaluation.  5) Mammogram - needs ordered today  6)  Return in about 2 months (around 03/27/2018) for 1-2 month nexplanon replacement.    Vena Austria, MD, [facility], [facility] 01/25/2018, 3:49 PM

## 2018-01-25 NOTE — Patient Instructions (Signed)
[facility] [address] [place] [address]  [facility] [place]  [address] [place] [address]  Phone: [phone]  Please schedule in the next 1-2 months

## 2018-01-26 LAB — FOLLICLE STIMULATING HORMONE: FSH: 56.1 m[IU]/mL

## 2018-01-26 LAB — ESTRADIOL: ESTRADIOL: 14.3 pg/mL

## 2018-01-27 LAB — NUSWAB BV AND CANDIDA, NAA
CANDIDA GLABRATA, NAA: NEGATIVE
Candida albicans, NAA: NEGATIVE

## 2018-01-30 ENCOUNTER — Other Ambulatory Visit: Payer: Self-pay | Admitting: Psychiatry

## 2018-01-31 ENCOUNTER — Telehealth: Payer: Self-pay

## 2018-01-31 LAB — CYTOLOGY - PAP
ADEQUACY: ABSENT
Chlamydia: NEGATIVE
DIAGNOSIS: NEGATIVE
HPV (WINDOPATH): NOT DETECTED
Neisseria Gonorrhea: NEGATIVE

## 2018-01-31 NOTE — Telephone Encounter (Signed)
Does not need refill this was one time dose for BV

## 2018-01-31 NOTE — Telephone Encounter (Signed)
Pharmacy aware

## 2018-01-31 NOTE — Telephone Encounter (Signed)
Pharmacy faxed over a refill request for the Metronidazole. She does not have any future appointments scheduled at this time.

## 2018-02-15 ENCOUNTER — Ambulatory Visit
Admission: RE | Admit: 2018-02-15 | Discharge: 2018-02-15 | Disposition: A | Discharge status: Home / Self Care | Payer: Medicare Other | Source: Ambulatory Visit | Attending: Adult Health | Admitting: Adult Health

## 2018-02-15 ENCOUNTER — Ambulatory Visit
Admission: RE | Admit: 2018-02-15 | Discharge: 2018-02-15 | Disposition: A | Discharge status: Home / Self Care | Payer: Medicare Other | Source: Ambulatory Visit | Attending: Family Medicine | Admitting: Family Medicine

## 2018-02-15 ENCOUNTER — Other Ambulatory Visit: Payer: Self-pay | Admitting: Adult Health

## 2018-02-15 DIAGNOSIS — M25561 Pain in right knee: Secondary | ICD-10-CM

## 2018-02-16 ENCOUNTER — Emergency Department
Admission: EM | Admit: 2018-02-16 | Discharge: 2018-02-18 | Disposition: A | Discharge status: Home / Self Care | Payer: Medicare Other | Attending: Emergency Medicine | Admitting: Emergency Medicine

## 2018-02-16 ENCOUNTER — Other Ambulatory Visit: Payer: Self-pay

## 2018-02-16 ENCOUNTER — Encounter: Payer: Self-pay | Admitting: Emergency Medicine

## 2018-02-16 DIAGNOSIS — J449 Chronic obstructive pulmonary disease, unspecified: Secondary | ICD-10-CM | POA: Insufficient documentation

## 2018-02-16 DIAGNOSIS — F250 Schizoaffective disorder, bipolar type: Secondary | ICD-10-CM | POA: Diagnosis present

## 2018-02-16 DIAGNOSIS — F70 Mild intellectual disabilities: Secondary | ICD-10-CM | POA: Diagnosis not present

## 2018-02-16 DIAGNOSIS — R443 Hallucinations, unspecified: Secondary | ICD-10-CM | POA: Diagnosis present

## 2018-02-16 DIAGNOSIS — F603 Borderline personality disorder: Secondary | ICD-10-CM | POA: Diagnosis present

## 2018-02-16 DIAGNOSIS — R45851 Suicidal ideations: Secondary | ICD-10-CM | POA: Diagnosis not present

## 2018-02-16 DIAGNOSIS — F259 Schizoaffective disorder, unspecified: Secondary | ICD-10-CM | POA: Diagnosis not present

## 2018-02-16 DIAGNOSIS — G809 Cerebral palsy, unspecified: Secondary | ICD-10-CM | POA: Insufficient documentation

## 2018-02-16 LAB — COMPREHENSIVE METABOLIC PANEL
ALK PHOS: 38 U/L (ref 38–126)
ALT: 18 U/L (ref 0–44)
ANION GAP: 8 (ref 5–15)
AST: 22 U/L (ref 15–41)
Albumin: 3.5 g/dL (ref 3.5–5.0)
BUN: 15 mg/dL (ref 6–20)
CALCIUM: 8.9 mg/dL (ref 8.9–10.3)
CO2: 27 mmol/L (ref 22–32)
Chloride: 108 mmol/L (ref 98–111)
Creatinine, Ser: 0.63 mg/dL (ref 0.44–1.00)
GFR calc Af Amer: >60 mL/min (ref >60)
GFR calc non Af Amer: >60 mL/min (ref >60)
Glucose, Bld: 104 mg/dL — ABNORMAL HIGH (ref 70–99)
Potassium: 3.4 mmol/L — ABNORMAL LOW (ref 3.5–5.1)
SODIUM: 143 mmol/L (ref 135–145)
TOTAL PROTEIN: 6.8 g/dL (ref 6.5–8.1)
Total Bilirubin: 0.3 mg/dL (ref 0.3–1.2)

## 2018-02-16 LAB — SALICYLATE LEVEL: Salicylate Lvl: <7 mg/dL (ref 2.8–30.0)

## 2018-02-16 LAB — URINE DRUG SCREEN, QUALITATIVE (ARMC ONLY)
Amphetamines, Ur Screen: NOT DETECTED
Barbiturates, Ur Screen: NOT DETECTED
Benzodiazepine, Ur Scrn: NOT DETECTED
CANNABINOID 50 NG, UR ~~LOC~~: NOT DETECTED
Cocaine Metabolite,Ur ~~LOC~~: NOT DETECTED
MDMA (Ecstasy)Ur Screen: NOT DETECTED
Methadone Scn, Ur: NOT DETECTED
Opiate, Ur Screen: NOT DETECTED
Phencyclidine (PCP) Ur S: NOT DETECTED
TRICYCLIC, UR SCREEN: NOT DETECTED

## 2018-02-16 LAB — CBC
HCT: 35.7 % — ABNORMAL LOW (ref 36.0–46.0)
Hemoglobin: 11.8 g/dL — ABNORMAL LOW (ref 12.0–15.0)
MCH: 33 pg (ref 26.0–34.0)
MCHC: 33.1 g/dL (ref 30.0–36.0)
MCV: 99.7 fL (ref 80.0–100.0)
Platelets: 290 10*3/uL (ref 150–400)
RBC: 3.58 MIL/uL — AB (ref 3.87–5.11)
RDW: 14.6 % (ref 11.5–15.5)
WBC: 5.3 10*3/uL (ref 4.0–10.5)
nRBC: 0 % (ref 0.0–0.2)

## 2018-02-16 LAB — ACETAMINOPHEN LEVEL

## 2018-02-16 LAB — ETHANOL: Alcohol, Ethyl (B): <10 mg/dL (ref <10)

## 2018-02-16 NOTE — ED Notes (Signed)
SOC complete.  Pt. Stated she still wants to walk out in traffic.  SOC feels that patient may need to be re-assessed in the morning.

## 2018-02-16 NOTE — ED Notes (Signed)
Vol at this point waiting for TTS

## 2018-02-16 NOTE — ED Notes (Signed)
Patient changed to burgandy scrubs. Clothes red t shirt, blue pants, bra, socks, purse with keys but no cell phone. Tennis shoes.

## 2018-02-16 NOTE — ED Notes (Signed)
Pt. Awake laying in bed.  Pt. States "Look at this(pt. Pointed to lt. Wrist)  Pt. Has red mark on lt. Wrist, no skin broken.  Pt. States "I tried to kill myself".  This Clinical research associate asked "Why do you want to kill yourself".  "I just don't want to live anymore."

## 2018-02-16 NOTE — ED Provider Notes (Signed)
[facility] Emergency Department Provider Note       Time seen: ----------------------------------------- 6:23 PM on 02/16/2018 -----------------------------------------   I have reviewed the triage vital signs and the nursing notes.  HISTORY   Chief Complaint Suicidal    HPI [name] is a 52 y.o. female with a history of anxiety, asthma, borderline personality disorder, COPD, GERD, schizoaffective disorder who presents to the ED for hallucinations telling her to kill herself.  Patient states she try to cut her wrist with a calm.  She also reports she had a plan of walking out into traffic.  She is from a group home.  Patient states she feels this way because of the loss of her loved ones.  Past Medical History:  Diagnosis Date  . Anxiety   . Asthma   . Borderline personality disorder (HCC)   . Cerebral palsy (HCC)   . COPD (chronic obstructive pulmonary disease) (HCC)   . Depression   . GERD (gastroesophageal reflux disease)   . Mild cognitive impairment   . Schizoaffective disorder (HCC)   . Wound abscess     Patient Active Problem List   Diagnosis Date Noted  . Severe recurrent major depression without psychotic features (HCC) 08/10/2016  . Overdose by acetaminophen 08/08/2016  . Schizoaffective disorder, bipolar type (HCC)   . 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 GERD (gastroesophageal reflux disease) 09/24/2014  . COPD (chronic obstructive pulmonary disease) (HCC) 09/24/2014  . Borderline personality disorder (HCC) 09/24/2014  . Cerebral palsy (HCC) 08/31/2014    Past Surgical History:  Procedure Laterality Date  . CHOLECYSTECTOMY    . INCISION AND DRAINAGE ABSCESS Right 01/02/2015   Procedure: INCISION AND DRAINAGE ABSCESS;  Surgeon: Tiney Rouge III, MD;  Location: [facility] ORS;  Service: General;  Laterality: Right;  . TONSILLECTOMY      Allergies Penicillins  Social History Social History   Tobacco Use  . Smoking  status: Never Smoker  . Smokeless tobacco: Never Used  Substance Use Topics  . Alcohol use: No  . Drug use: No   Review of Systems Constitutional: Negative for fever. Cardiovascular: Negative for chest pain. Respiratory: Negative for shortness of breath. Gastrointestinal: Negative for abdominal pain, vomiting and diarrhea. Musculoskeletal: Negative for back pain. Skin: Negative for rash. Neurological: Negative for headaches, focal weakness or numbness. Psychiatric: Positive for suicidal ideation  All systems negative/normal/unremarkable except as stated in the HPI  ____________________________________________   PHYSICAL EXAM:  VITAL SIGNS: ED Triage Vitals [02/16/18 1742]  Enc Vitals Group     BP 113/62     Pulse Rate 87     Resp 18     Temp 98 F (36.7 C)     Temp Source Oral     SpO2 97 %     Weight 185 lb (83.9 kg)     Height 5\' 3"  (1.6 m)     Head Circumference      Peak Flow      Pain Score 10     Pain Loc      Pain Edu?      Excl. in GC?    Constitutional: Alert and oriented. Well appearing and in no distress. Eyes: Conjunctivae are normal. Normal extraocular movements. ENT   Head: Normocephalic and atraumatic.   Nose: No congestion/rhinnorhea.   Mouth/Throat: Mucous membranes are moist.   Neck: No stridor. Cardiovascular: Normal rate, regular rhythm. No murmurs, rubs, or gallops. Respiratory: Normal respiratory effort without tachypnea nor retractions. Breath sounds are clear  and equal bilaterally. No wheezes/rales/rhonchi. Gastrointestinal: Soft and nontender. Normal bowel sounds Musculoskeletal: Nontender with normal range of motion in extremities. No lower extremity tenderness nor edema. Neurologic:  Normal speech and language. No gross focal neurologic deficits are appreciated.  Skin:  Skin is warm, dry and intact. No rash noted. Psychiatric: Depressed mood and affect ____________________________________________  ED COURSE:  As part  of my medical decision making, I reviewed the following data within the electronic MEDICAL RECORD NUMBER History obtained from family if available, nursing notes, old chart and ekg, as well as notes from prior ED visits. Patient presented for suicidal ideation, we will assess with labs and consult psychiatry.   Procedures ____________________________________________   LABS (pertinent positives/negatives)  Labs Reviewed  CBC - Abnormal; Notable for the following components:      Result Value   RBC 3.58 (*)    Hemoglobin 11.8 (*)    HCT 35.7 (*)    All other components within normal limits  COMPREHENSIVE METABOLIC PANEL  ETHANOL  SALICYLATE LEVEL  ACETAMINOPHEN LEVEL  URINE DRUG SCREEN, QUALITATIVE ([facility] ONLY)  POC URINE PREG, ED   ____________________________________________  DIFFERENTIAL DIAGNOSIS   Depression, suicidal ideation, mental retardation, schizoaffective disorder  FINAL ASSESSMENT AND PLAN  Suicidal ideation   Plan: The patient had presented for suicidal ideation. Patient's labs are unremarkable.  She appears medically clear for psychiatric evaluation and disposition.   Ulice Dash, MD   Note: This note was generated in part or whole with voice recognition software. Voice recognition is usually quite accurate but there are transcription errors that can and very often do occur. I apologize for any typographical errors that were not detected and corrected.     Emily Filbert, MD 02/16/18 [phone]

## 2018-02-16 NOTE — ED Triage Notes (Addendum)
First Nurse Note:  Arrives with [employer] voluntarily for c/o SI/ HI.   Patient is calm and cooperative. NAD at this time. Placed in Triage area for safety.

## 2018-02-16 NOTE — ED Notes (Addendum)
Pt reports SI/HI. Reports hallucinations telling her to kill herself. States that she tried to cut her wrist with comb. None observed. Pt requests a meal tray.

## 2018-02-16 NOTE — ED Triage Notes (Signed)
Suicidal ideation x 2 days. States had plan of walking out in traffic. States today scratched L forearm with comb. Noted slightly pink. Lives at [facility] group home on [address] in [place]. States is own guardian.

## 2018-02-17 DIAGNOSIS — F259 Schizoaffective disorder, unspecified: Secondary | ICD-10-CM | POA: Diagnosis not present

## 2018-02-17 LAB — VALPROIC ACID LEVEL: Valproic Acid Lvl: 38 ug/mL — ABNORMAL LOW (ref 50.0–100.0)

## 2018-02-17 MED ORDER — LEVOTHYROXINE SODIUM 25 MCG PO TABS: 50.0000 ug | ORAL_TABLET | Freq: Every day | ORAL | Status: DC

## 2018-02-17 MED ORDER — MOMETASONE FURO-FORMOTEROL FUM 200-5 MCG/ACT IN AERO
2.0000 | INHALATION_SPRAY | Freq: Two times a day (BID) | RESPIRATORY_TRACT | Status: DC
  Administered 2018-02-18 (×2): 2 via RESPIRATORY_TRACT
  Filled 2018-02-17: qty 8.8

## 2018-02-17 MED ORDER — FUROSEMIDE 20 MG PO TABS: 20.0000 mg | ORAL_TABLET | Freq: Every day | ORAL | Status: DC

## 2018-02-17 MED ORDER — SUCRALFATE 1 G PO TABS
1.0000 g | ORAL_TABLET | Freq: Three times a day (TID) | ORAL | Status: DC
  Administered 2018-02-18 (×2): 1 g via ORAL
  Filled 2018-02-17 (×4): qty 1

## 2018-02-17 MED ORDER — FOLIC ACID 1 MG PO TABS
1.0000 mg | ORAL_TABLET | Freq: Every day | ORAL | Status: DC
  Administered 2018-02-18: 1 mg via ORAL
  Filled 2018-02-17: qty 1

## 2018-02-17 MED ORDER — ALUM & MAG HYDROXIDE-SIMETH 200-200-20 MG/5ML PO SUSP
30.0000 mL | Freq: Once | ORAL | Status: AC
  Administered 2018-02-17: 30 mL via ORAL

## 2018-02-17 MED ORDER — ALUM & MAG HYDROXIDE-SIMETH 200-200-20 MG/5ML PO SUSP
ORAL | Status: AC
  Administered 2018-02-17: 30 mL via ORAL
  Filled 2018-02-17: qty 30

## 2018-02-17 MED ORDER — LEVOTHYROXINE SODIUM 25 MCG PO TABS
50.0000 ug | ORAL_TABLET | Freq: Every day | ORAL | Status: DC
  Administered 2018-02-18: 50 ug via ORAL
  Filled 2018-02-17: qty 2

## 2018-02-17 MED ORDER — POLYETHYLENE GLYCOL 3350 17 G PO PACK
17.0000 g | PACK | Freq: Every day | ORAL | Status: DC
  Filled 2018-02-17: qty 1

## 2018-02-17 MED ORDER — POTASSIUM CHLORIDE CRYS ER 10 MEQ PO TBCR
10.0000 meq | EXTENDED_RELEASE_TABLET | Freq: Every day | ORAL | Status: DC
  Administered 2018-02-18: 10 meq via ORAL
  Filled 2018-02-17 (×2): qty 1

## 2018-02-17 MED ORDER — DIVALPROEX SODIUM 500 MG PO DR TAB
500.0000 mg | DELAYED_RELEASE_TABLET | Freq: Two times a day (BID) | ORAL | Status: DC
  Administered 2018-02-18: 500 mg via ORAL
  Filled 2018-02-17: qty 1

## 2018-02-17 MED ORDER — ESCITALOPRAM OXALATE 10 MG PO TABS: 20.0000 mg | ORAL_TABLET | Freq: Every day | ORAL | Status: DC

## 2018-02-17 MED ORDER — ASPIRIN EC 81 MG PO TBEC: 81.0000 mg | DELAYED_RELEASE_TABLET | Freq: Every day | ORAL | Status: DC

## 2018-02-17 MED ORDER — PANTOPRAZOLE SODIUM 40 MG PO TBEC
40.0000 mg | DELAYED_RELEASE_TABLET | Freq: Every day | ORAL | Status: DC
  Administered 2018-02-18: 40 mg via ORAL
  Filled 2018-02-17: qty 1

## 2018-02-17 MED ORDER — VITAMIN B-12 1000 MCG PO TABS
1000.0000 ug | ORAL_TABLET | Freq: Every day | ORAL | Status: DC
  Administered 2018-02-18: 1000 ug via ORAL
  Filled 2018-02-17: qty 1

## 2018-02-17 MED ORDER — LORATADINE 10 MG PO TABS
10.0000 mg | ORAL_TABLET | Freq: Every day | ORAL | Status: DC
  Administered 2018-02-18: 10 mg via ORAL
  Filled 2018-02-17: qty 1

## 2018-02-17 MED ORDER — FENOFIBRATE 160 MG PO TABS: 160.0000 mg | ORAL_TABLET | Freq: Every day | ORAL | Status: DC

## 2018-02-17 MED ORDER — RISPERIDONE 3 MG PO TABS: 3.0000 mg | ORAL_TABLET | Freq: Two times a day (BID) | ORAL | Status: DC

## 2018-02-17 MED ORDER — CLONAZEPAM 0.5 MG PO TABS
0.5000 mg | ORAL_TABLET | Freq: Two times a day (BID) | ORAL | Status: DC
  Administered 2018-02-18: 0.5 mg via ORAL
  Filled 2018-02-17: qty 1

## 2018-02-17 MED ORDER — DIVALPROEX SODIUM 500 MG PO DR TAB: 500.0000 mg | DELAYED_RELEASE_TABLET | Freq: Two times a day (BID) | ORAL | Status: DC

## 2018-02-17 MED ORDER — FUROSEMIDE 20 MG PO TABS
20.0000 mg | ORAL_TABLET | Freq: Every day | ORAL | Status: DC
  Administered 2018-02-18: 20 mg via ORAL
  Filled 2018-02-17 (×2): qty 1

## 2018-02-17 MED ORDER — ARIPIPRAZOLE 5 MG PO TABS
5.0000 mg | ORAL_TABLET | Freq: Every day | ORAL | Status: DC
  Administered 2018-02-18: 5 mg via ORAL
  Filled 2018-02-17: qty 1

## 2018-02-17 MED ORDER — ESCITALOPRAM OXALATE 10 MG PO TABS
20.0000 mg | ORAL_TABLET | Freq: Every day | ORAL | Status: DC
  Administered 2018-02-18: 20 mg via ORAL
  Filled 2018-02-17: qty 2

## 2018-02-17 MED ORDER — OXYBUTYNIN CHLORIDE ER 5 MG PO TB24
5.0000 mg | ORAL_TABLET | Freq: Every day | ORAL | Status: DC
  Administered 2018-02-18: 5 mg via ORAL
  Filled 2018-02-17 (×2): qty 1

## 2018-02-17 MED ORDER — ALUM & MAG HYDROXIDE-SIMETH 200-200-20 MG/5ML PO SUSP: 30.0000 mL | Freq: Three times a day (TID) | ORAL | Status: DC | PRN

## 2018-02-17 MED ORDER — PANTOPRAZOLE SODIUM 40 MG PO TBEC: 40.0000 mg | DELAYED_RELEASE_TABLET | Freq: Every day | ORAL | Status: DC

## 2018-02-17 MED ORDER — ARIPIPRAZOLE 5 MG PO TABS: 5.0000 mg | ORAL_TABLET | Freq: Every day | ORAL | Status: DC

## 2018-02-17 MED ORDER — OXYBUTYNIN CHLORIDE ER 5 MG PO TB24: 5.0000 mg | ORAL_TABLET | Freq: Every day | ORAL | Status: DC

## 2018-02-17 MED ORDER — ASPIRIN EC 81 MG PO TBEC
81.0000 mg | DELAYED_RELEASE_TABLET | Freq: Every day | ORAL | Status: DC
  Administered 2018-02-18: 81 mg via ORAL
  Filled 2018-02-17: qty 1

## 2018-02-17 MED ORDER — FENOFIBRATE 160 MG PO TABS
160.0000 mg | ORAL_TABLET | Freq: Every day | ORAL | Status: DC
  Administered 2018-02-18: 160 mg via ORAL
  Filled 2018-02-17 (×3): qty 1

## 2018-02-17 NOTE — Consult Note (Signed)
Patient most likely does not meet criteria for admission but I did not hve the chance to see her today. Dr. Toni Amend, who is familiar with this patient, will see her tomorrow.   Today, we were unable to get in touch with her group home and there was no Child psychotherapist to assist. 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  I entered medications order.

## 2018-02-17 NOTE — ED Notes (Addendum)
Pt sitting on side of bed. Pt given breakfast tray. Pt was able to sit up without any assistance.

## 2018-02-17 NOTE — ED Notes (Signed)
Pt has told RN she is hallucinating and needs to be moved downstairs quickly.  Pt further states she has been swatting at objects flying around her. This has not been observed by security or any other staff member.   Pt told nurse tech she does not feel safe here, but would feel safe downstairs or another inpatient facility.([facility]).   Maintained on 15 minute checks and observation by security camera for safety.

## 2018-02-17 NOTE — ED Notes (Signed)
Pt continues to ask when she will be moved to [facility].   Maintained on 15 minute checks and observation by security camera for safety.

## 2018-02-17 NOTE — ED Notes (Signed)
Patient is having soc at this time, nurse let Doctor know that patient had refused medications yesterday, and states that the medication does not help her.

## 2018-02-17 NOTE — BH Assessment (Signed)
Assessment Note  [name] is an 52 y.o. female. Patient presents to [facility] voluntarily with BPD for to suicidal ideation. Patient states she has a plan to walk out into traffic. Patient noted during the assessment a scratch she self inflicted on her wrist using a comb. Patient states voices are telling her to kill herself. Patient states she sees people coming after her. Patient repeatedly stated during assessment "I need help." "I need to be here." "Am I going to get to stay here." Patient reports " I miss my loved ones." "I miss my mom." Patient states her family is still living she just doesn't get to talk to them. Patient endorses 7-8 previous suicide attempts via pills. Patient states she is depressed.   Patient denies illicit substance and alcohol use.  Patient doesn't currently have any involvement in the legal system.  Patient presents oriented x 4, anxious and cooperative during assessment. Patient appeared to "dramatize" symptoms during assessment in an attempt to insure she will be admitted to the psych unit. Patient has a history of borderline personality disorder, and schizoaffective disorder.   Diagnosis: Schizoaffective Disorder  Past Medical History:  Past Medical History:  Diagnosis Date  . Anxiety   . Asthma   . Borderline personality disorder (HCC)   . Cerebral palsy (HCC)   . COPD (chronic obstructive pulmonary disease) (HCC)   . Depression   . GERD (gastroesophageal reflux disease)   . Mild cognitive impairment   . Schizoaffective disorder (HCC)   . Wound abscess     Past Surgical History:  Procedure Laterality Date  . CHOLECYSTECTOMY    . INCISION AND DRAINAGE ABSCESS Right 01/02/2015   Procedure: INCISION AND DRAINAGE ABSCESS;  Surgeon: Tiney Rouge III, MD;  Location: [facility] ORS;  Service: General;  Laterality: Right;  . TONSILLECTOMY      Family History:  Family History  Problem Relation Age of Onset  . Heart attack Father   . Diabetes Mother      Social History:  reports that she has never smoked. She has never used smokeless tobacco. She reports that she does not drink alcohol or use drugs.  Additional Social History:  Alcohol / Drug Use Pain Medications: SEE PTA  Prescriptions: SEE PTA  Over the Counter: SEE PTA  History of alcohol / drug use?: No history of alcohol / drug abuse Longest period of sobriety (when/how long): None reported   CIWA: CIWA-Ar BP: 119/70 Pulse Rate: 77 COWS:    Allergies:  Allergies  Allergen Reactions  . Penicillins Nausea And Vomiting and Other (See Comments)    Reaction:  No  Has patient had a PCN reaction causing immediate rash, facial/tongue/throat swelling, SOB or lightheadedness with hypotension: No Has patient had a PCN reaction causing severe rash involving mucus membranes or skin necrosis: No Has patient had a PCN reaction that required hospitalization No Has patient had a PCN reaction occurring within the last 10 years: No If all of the above answers are "NO", then may proceed with Cephalosporin use.     Home Medications:  (Not in a hospital admission)  OB/GYN Status:  No LMP recorded. Patient has had an implant.  General Assessment Data Assessment unable to be completed: (Assessment completed) Location of Assessment: [facility] ED TTS Assessment: In system Is this a Tele or Face-to-Face Assessment?: Face-to-Face Is this an Initial Assessment or a Re-assessment for this encounter?: Initial Assessment Patient Accompanied by:: N/A Language Other than English: No Living Arrangements: In Group Home: (Comment: Name  of Group Home)([facility] ) What gender do you identify as?: Female Marital status: Single Maiden name: N/A Pregnancy Status: No Living Arrangements: Group Home([facility] ) Can pt return to current living arrangement?: Yes Admission Status: Voluntary Is patient capable of signing voluntary admission?: Yes Referral Source: Self/Family/Friend Insurance  type: Medicare  Medical Screening Exam [facility] Walk-in ONLY) Medical Exam completed: Yes  Crisis Care Plan Living Arrangements: Group Home([facility] ) Legal Guardian: Other:(None reported) Name of Psychiatrist: [facility]  Name of Therapist: [facility]   Education Status Is patient currently in school?: No Is the patient employed, unemployed or receiving disability?: Unemployed, Receiving disability income  Risk to self with the past 6 months Suicidal Ideation: Yes-Currently Present Has patient been a risk to self within the past 6 months prior to admission? : No Suicidal Intent: No Has patient had any suicidal intent within the past 6 months prior to admission? : No Is patient at risk for suicide?: No Suicidal Plan?: Yes-Currently Present Has patient had any suicidal plan within the past 6 months prior to admission? : No Specify Current Suicidal Plan: run into traffic  Access to Means: No What has been your use of drugs/alcohol within the last 12 months?: None reported  Previous Attempts/Gestures: Yes How many times?: 7 Other Self Harm Risks: None reported  Triggers for Past Attempts: Unpredictable Intentional Self Injurious Behavior: Bruising Comment - Self Injurious Behavior: using a objects to scrap her wrist  Family Suicide History: No Recent stressful life event(s): Other (Comment)("I miss my loved ones") Persecutory voices/beliefs?: No Depression: Yes Depression Symptoms: Feeling angry/irritable Substance abuse history and/or treatment for substance abuse?: No Suicide prevention information given to non-admitted patients: Not applicable  Risk to Others within the past 6 months Homicidal Ideation: Yes-Currently Present Does patient have any lifetime risk of violence toward others beyond the six months prior to admission? : No Thoughts of Harm to Others: Yes-Currently Present Comment - Thoughts of Harm to Others: "I would kill the owners of the group home I used to live  at"  Current Homicidal Intent: No Current Homicidal Plan: No Access to Homicidal Means: No Describe Access to Homicidal Means: N/A Identified Victim: former group home owners History of harm to others?: No Assessment of Violence: None Noted Violent Behavior Description: None reported Does patient have access to weapons?: No Criminal Charges Pending?: No Does patient have a court date: No Is patient on probation?: No  Psychosis Hallucinations: None noted Delusions: None noted  Mental Status Report Appearance/Hygiene: In scrubs Eye Contact: Fair Motor Activity: Hyperactivity Speech: Pressured, Loud Level of Consciousness: Alert Mood: Anxious Affect: Anxious Anxiety Level: Moderate Thought Processes: Circumstantial Judgement: Impaired Orientation: Person, Place, Time, Situation, Appropriate for developmental age Obsessive Compulsive Thoughts/Behaviors: Minimal  Cognitive Functioning Concentration: Normal Memory: Recent Intact, Remote Intact Is patient IDD: No Insight: Poor Impulse Control: Poor Appetite: Good Have you had any weight changes? : No Change Sleep: Decreased Total Hours of Sleep: 4 Vegetative Symptoms: None  ADLScreening [facility] Assessment Services) Patient's cognitive ability adequate to safely complete daily activities?: Yes Patient able to express need for assistance with ADLs?: Yes Independently performs ADLs?: Yes (appropriate for developmental age)  Prior Inpatient Therapy Prior Inpatient Therapy: Yes Prior Therapy Dates: Multiple hospitalizations  Prior Therapy Facilty/Provider(s): [facility]  Reason for Treatment: Depression  Prior Outpatient Therapy Prior Outpatient Therapy: Yes Prior Therapy Dates: current  Prior Therapy Facilty/Provider(s): [facility]  Reason for Treatment: Depression Does patient have an ACCT team?: Yes Does patient have Intensive  In-House Services?  : No Does patient have [facility] services? : No Does patient have P4CC services?:  No  ADL Screening (condition at time of admission) Patient's cognitive ability adequate to safely complete daily activities?: Yes Is the patient deaf or have difficulty hearing?: No Does the patient have difficulty seeing, even when wearing glasses/contacts?: No Does the patient have difficulty concentrating, remembering, or making decisions?: No Patient able to express need for assistance with ADLs?: Yes Does the patient have difficulty dressing or bathing?: No Independently performs ADLs?: Yes (appropriate for developmental age) Does the patient have difficulty walking or climbing stairs?: No Weakness of Legs: None Weakness of Arms/Hands: None  Home Assistive Devices/Equipment Home Assistive Devices/Equipment: None  Therapy Consults (therapy consults require a physician order) PT Evaluation Needed: No OT Evalulation Needed: No SLP Evaluation Needed: No Abuse/Neglect Assessment (Assessment to be complete while patient is alone) Abuse/Neglect Assessment Can Be Completed: Yes Physical Abuse: Denies Verbal Abuse: Denies Sexual Abuse: Denies Exploitation of patient/patient's resources: Denies Self-Neglect: Denies Values / Beliefs Cultural Requests During Hospitalization: None Spiritual Requests During Hospitalization: None Consults Spiritual Care Consult Needed: No Social Work Consult Needed: No Merchant navy officer (For Healthcare) Does Patient Have a Medical Advance Directive?: No          Disposition:  Disposition Initial Assessment Completed for this Encounter: Yes Patient referred to: Other (Comment)(Pending SOC )  On Site Evaluation by:   Reviewed with Physician:    Galen Manila, LPC, LCASA 02/17/2018 10:29 AM

## 2018-02-17 NOTE — ED Notes (Signed)
Called for re-evaluation of patient by [facility] per TTS  [phone]

## 2018-02-17 NOTE — ED Notes (Signed)
Upon checking on patient, pt. Stated "I am having chest pain"  MD notified, orders issued.

## 2018-02-17 NOTE — ED Provider Notes (Signed)
-----------------------------------------   6:44 AM on 02/17/2018 -----------------------------------------   Blood pressure 119/70, pulse 77, temperature 98 F (36.7 C), temperature source Oral, resp. rate 16, height 5\' 3"  (1.6 m), weight 83.9 kg, SpO2 97 %.  The patient had no acute events since last update.  Calm and cooperative at this time.  She did develop some chest discomfort about 30 minutes ago.  EKG was obtained and is normal  ED ECG REPORT I, Dionne Bucy, the attending physician, personally viewed and interpreted this ECG.  Date: 02/17/2018 EKG Time: 1813 Rate: 70 Rhythm: normal sinus rhythm QRS Axis: normal Intervals: normal ST/T Wave abnormalities: normal Narrative Interpretation: no evidence of acute ischemia   The patient was evaluated by [facility] and cleared, however she still expresses thoughts of self-harm so she will require reevaluation.  Disposition is pending behavioral team recommendations.    Dionne Bucy, MD 02/17/18 [phone]

## 2018-02-17 NOTE — ED Notes (Signed)
Pt complaining of pain in her right leg. Pt stated she would like pain medication but  Tylenol and Advil don't help."   RN will notify EDP.

## 2018-02-17 NOTE — ED Notes (Signed)
SOC in progress.  Maintained on 15 minute checks and observation by security camera for safety. 

## 2018-02-17 NOTE — ED Notes (Signed)
Pt walked to [facility] with this tech unassisted and was handed over to security in [place]. 15 minute check sheet given to security.

## 2018-02-17 NOTE — ED Notes (Signed)
Pt informed no update at this time.

## 2018-02-17 NOTE — ED Notes (Signed)
Pt asking RN repeatedly when she will be able to go to [facility].  RN explained the [facility] recommendation was for inpatient, but she does not have a bed at this time.   Maintained on 15 minute checks and observation by security camera for safety.

## 2018-02-17 NOTE — ED Notes (Signed)
SOC machine removed from room.

## 2018-02-17 NOTE — ED Notes (Signed)
Pt states she is suicidal and homicidal. When asked if she can remain safe while in the hospital pt stated, "As long as I am going to be admitted downstairs."   Per SOC report pt is to be admitted.   Maintained on 15 minute checks and observation by security camera for safety.

## 2018-02-17 NOTE — ED Notes (Signed)
Pt ask for med doctor. Pt informed that MD was busy at this time and what did pt need. Pt stated she was SI and HI. Pt informed she would have to talk to psych doctor and pt ask if she would have to leave today. Pt informed this tech is not aware of plans.

## 2018-02-17 NOTE — ED Notes (Signed)
Dr. Lenna Gilford for Patient to transfer to [facility], nurse gave report to Amy RN in unit.

## 2018-02-17 NOTE — ED Notes (Signed)
Patient told nurse that she was having Si/hi and avh, and that she was not ready to leave and needed help, nurse let her know that she would probably be re-evaluated.

## 2018-02-17 NOTE — ED Notes (Signed)

## 2018-02-17 NOTE — ED Notes (Signed)
Pt ambulated to and from bathroom unassisted

## 2018-02-18 DIAGNOSIS — F259 Schizoaffective disorder, unspecified: Secondary | ICD-10-CM | POA: Diagnosis not present

## 2018-02-18 DIAGNOSIS — F70 Mild intellectual disabilities: Secondary | ICD-10-CM | POA: Diagnosis not present

## 2018-02-18 MED ORDER — MOMETASONE FURO-FORMOTEROL FUM 200-5 MCG/ACT IN AERO: 2.0000 | INHALATION_SPRAY | Freq: Two times a day (BID) | RESPIRATORY_TRACT | Status: DC

## 2018-02-18 NOTE — Consult Note (Signed)
[facility] Consult   Reason for Consult: Consult for this 52 year old woman with intellectual disability and long history of behavior problems who has been in the emergency room over the weekend on a psychiatric hold Referring Physician: Paduchowski [name] MRN:  [id number] Principal Diagnosis: Mild intellectual disability Diagnosis:   Patient Active Problem List   Diagnosis Date Noted  . Severe recurrent major depression without psychotic features ([place]) [F33.2] 08/10/2016  . Overdose by acetaminophen [T39.1X1A] 08/08/2016  . Schizoaffective disorder, bipolar type ([place]) [F25.0]   . 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 GERD (gastroesophageal reflux disease) [K21.9] 09/24/2014  . COPD (chronic obstructive pulmonary disease) ([place]) [J44.9] 09/24/2014  . Borderline personality disorder ([place]) [F60.3] 09/24/2014  . Cerebral palsy ([place]) [G80.9] 08/31/2014    Total Time spent with patient: 1 hour  Subjective:   [name] is a 52 y.o. female patient admitted with "I am suicidal and homicidal".  HPI: Patient seen chart reviewed.  Patient well known from many prior visits.  This is a 52 year old woman with intellectual disability and cerebral palsy.  She carries a diagnosis of schizoaffective disorder.  She has a behavior pattern of frequently coming to the emergency room and stating that she is suicidal and homicidal and claiming a multitude of other symptoms with the goal of being admitted to the psychiatric hospital.  Patient came in this time saying that she was thinking of walking out into traffic.  According to the notes when she was first interviewed she blamed her suicidality on missing her loved ones.  Talking with me today she says that she is afraid that people at her old group home are trying to get her.  She admits that she knows this is not true.  Patient is currently living at a new group home for the past week and  acknowledges it is better than the one she was at before.  She has been calm and cooperative as always here in the emergency room over the weekend.  Does not appear to be in any particular distress.  Does not appear to be in despair or to show any sign of having any reason to want to hurt her self.  Social history: Patient is her own guardian.  She has been in a great number of group homes over time because of her chronic behavior problems.  Medical history: COPD gastric reflux history of cerebral palsy with only mild deficits in coordination in addition to the intellectual disability  Substance abuse history: No history of alcohol or drug abuse  Past Psychiatric History: Patient is a well-known visitor to the emergency room.  Her visits are almost always exactly the same.  She will say that she is "suicidal and homicidal" in a stereotyped monotone voice.  She will often claim a variety of other symptoms such as believing that she is hearing or seeing things.  These will typically change from interview to interview.  Patient has a history of some superficial scratching in the past but no serious suicide attempts.  No history of violence.  She does have outpatient mental health care in place.  She has been released from the emergency room back to her group home a very large number of times and always done well with it.  We have had in the past times where we admitted her to the hospital and it did not appear to have any particular benefit or change her baseline mental state.  Risk to Self: Suicidal Ideation: Yes-Currently  Present Suicidal Intent: No Is patient at risk for suicide?: No Suicidal Plan?: Yes-Currently Present Specify Current Suicidal Plan: run into traffic  Access to Means: No What has been your use of drugs/alcohol within the last 12 months?: None reported  How many times?: 7 Other Self Harm Risks: None reported  Triggers for Past Attempts: Unpredictable Intentional Self Injurious  Behavior: Bruising Comment - Self Injurious Behavior: using a objects to scrap her wrist  Risk to Others: Homicidal Ideation: Yes-Currently Present Thoughts of Harm to Others: Yes-Currently Present Comment - Thoughts of Harm to Others: "I would kill the owners of the group home I used to live at"  Current Homicidal Intent: No Current Homicidal Plan: No Access to Homicidal Means: No Describe Access to Homicidal Means: N/A Identified Victim: former group home owners History of harm to others?: No Assessment of Violence: None Noted Violent Behavior Description: None reported Does patient have access to weapons?: No Criminal Charges Pending?: No Does patient have a court date: No Prior Inpatient Therapy: Prior Inpatient Therapy: Yes Prior Therapy Dates: Multiple hospitalizations  Prior Therapy Facilty/Provider(s): [facility]  Reason for Treatment: Depression Prior Outpatient Therapy: Prior Outpatient Therapy: Yes Prior Therapy Dates: current  Prior Therapy Facilty/Provider(s): [facility]  Reason for Treatment: Depression Does patient have an ACCT team?: Yes Does patient have Intensive In-House Services?  : No Does patient have [facility] services? : No Does patient have P4CC services?: No  Past Medical History:  Past Medical History:  Diagnosis Date  . Anxiety   . Asthma   . Borderline personality disorder ([place])   . Cerebral palsy ([place])   . COPD (chronic obstructive pulmonary disease) ([place])   . Depression   . GERD (gastroesophageal reflux disease)   . Mild cognitive impairment   . Schizoaffective disorder ([place])   . Wound abscess     Past Surgical History:  Procedure Laterality Date  . CHOLECYSTECTOMY    . INCISION AND DRAINAGE ABSCESS Right 01/02/2015   Procedure: INCISION AND DRAINAGE ABSCESS;  Surgeon: Dia Crawford III, MD;  Location: [facility] ORS;  Service: General;  Laterality: Right;  . TONSILLECTOMY     Family History:  Family History  Problem Relation Age of Onset  . Heart attack  Father   . Diabetes Mother    Family Psychiatric  History: None known Social History:  Social History   Substance and Sexual Activity  Alcohol Use No     Social History   Substance and Sexual Activity  Drug Use No    Social History   Socioeconomic History  . Marital status: Single    Spouse name: Not on file  . Number of children: Not on file  . Years of education: Not on file  . Highest education level: Not on file  Occupational History  . Not on file  Social Needs  . Financial resource strain: Not on file  . Food insecurity:    Worry: Not on file    Inability: Not on file  . Transportation needs:    Medical: Not on file    Non-medical: Not on file  Tobacco Use  . Smoking status: Never Smoker  . Smokeless tobacco: Never Used  Substance and Sexual Activity  . Alcohol use: No  . Drug use: No  . Sexual activity: Not Currently    Birth control/protection: Implant  Lifestyle  . Physical activity:    Days per week: Not on file    Minutes per session: Not on file  . Stress: Not on  file  Relationships  . Social connections:    Talks on phone: Not on file    Gets together: Not on file    Attends religious service: Not on file    Active member of club or organization: Not on file    Attends meetings of clubs or organizations: Not on file    Relationship status: Not on file  Other Topics Concern  . Not on file  Social History Narrative  . Not on file   Additional Social History:    Allergies:   Allergies  Allergen Reactions  . Penicillins Nausea And Vomiting and Other (See Comments)    Reaction:  No  Has patient had a PCN reaction causing immediate rash, facial/tongue/throat swelling, SOB or lightheadedness with hypotension: No Has patient had a PCN reaction causing severe rash involving mucus membranes or skin necrosis: No Has patient had a PCN reaction that required hospitalization No Has patient had a PCN reaction occurring within the last 10 years:  No If all of the above answers are "NO", then may proceed with Cephalosporin use.     Labs:  Results for orders placed or performed during the hospital encounter of 02/16/18 (from the past 48 hour(s))  Comprehensive metabolic panel     Status: Abnormal   Collection Time: 02/16/18  5:50 PM  Result Value Ref Range   Sodium 143 135 - 145 mmol/L   Potassium 3.4 (L) 3.5 - 5.1 mmol/L   Chloride 108 98 - 111 mmol/L   CO2 27 22 - 32 mmol/L   Glucose, Bld 104 (H) 70 - 99 mg/dL   BUN 15 6 - 20 mg/dL   Creatinine, Ser 0.63 0.44 - 1.00 mg/dL   Calcium 8.9 8.9 - 10.3 mg/dL   Total Protein 6.8 6.5 - 8.1 g/dL   Albumin 3.5 3.5 - 5.0 g/dL   AST 22 15 - 41 U/L   ALT 18 0 - 44 U/L   Alkaline Phosphatase 38 38 - 126 U/L   Total Bilirubin 0.3 0.3 - 1.2 mg/dL   GFR calc non Af Amer >60 >60 mL/min   GFR calc Af Amer >60 >60 mL/min    Comment: (NOTE) The eGFR has been calculated using the CKD EPI equation. This calculation has not been validated in all clinical situations. eGFR's persistently <60 mL/min signify possible Chronic Kidney Disease.    Anion gap 8 5 - 15    Comment: Performed at [facility], [address] [place], [place] [address]  Ethanol     Status: None   Collection Time: 02/16/18  5:50 PM  Result Value Ref Range   Alcohol, Ethyl (B) <10 <10 mg/dL    Comment: (NOTE) Lowest detectable limit for serum alcohol is 10 mg/dL. For medical purposes only. Performed at [facility], [address] [place], [place] [address]   Salicylate level     Status: None   Collection Time: 02/16/18  5:50 PM  Result Value Ref Range   Salicylate Lvl <3.1 2.8 - 30.0 mg/dL    Comment: Performed at [facility], [address] [place], [place] [address]  Acetaminophen level     Status: Abnormal   Collection Time: 02/16/18  5:50 PM  Result Value Ref Range   Acetaminophen (Tylenol), Serum <10 (L) 10 - 30 ug/mL    Comment: (NOTE) Therapeutic concentrations vary  significantly. A range of 10-30 ug/mL  may be an effective concentration for many patients. However, some  are best treated at  concentrations outside of this range. Acetaminophen concentrations >150 ug/mL at 4 hours after ingestion  and >50 ug/mL at 12 hours after ingestion are often associated with  toxic reactions. Performed at [facility], [address] [place], [place] [address]   cbc     Status: Abnormal   Collection Time: 02/16/18  5:50 PM  Result Value Ref Range   WBC 5.3 4.0 - 10.5 K/uL   RBC 3.58 (L) 3.87 - 5.11 MIL/uL   Hemoglobin 11.8 (L) 12.0 - 15.0 g/dL   HCT 35.7 (L) 36.0 - 46.0 %   MCV 99.7 80.0 - 100.0 fL   MCH 33.0 26.0 - 34.0 pg   MCHC 33.1 30.0 - 36.0 g/dL   RDW 14.6 11.5 - 15.5 %   Platelets 290 150 - 400 K/uL   nRBC 0.0 0.0 - 0.2 %    Comment: Performed at [facility], [address] [place], [place] [address]  Urine Drug Screen, Qualitative     Status: None   Collection Time: 02/16/18  5:50 PM  Result Value Ref Range   Tricyclic, Ur Screen NONE DETECTED NONE DETECTED   Amphetamines, Ur Screen NONE DETECTED NONE DETECTED   MDMA (Ecstasy)Ur Screen NONE DETECTED NONE DETECTED   Cocaine Metabolite,Ur [place] NONE DETECTED NONE DETECTED   Opiate, Ur Screen NONE DETECTED NONE DETECTED   Phencyclidine (PCP) Ur S NONE DETECTED NONE DETECTED   Cannabinoid 50 Ng, Ur [place] NONE DETECTED NONE DETECTED   Barbiturates, Ur Screen NONE DETECTED NONE DETECTED   Benzodiazepine, Ur Scrn NONE DETECTED NONE DETECTED   Methadone Scn, Ur NONE DETECTED NONE DETECTED    Comment: (NOTE) Tricyclics + metabolites, urine    Cutoff 1000 ng/mL Amphetamines + metabolites, urine  Cutoff 1000 ng/mL MDMA (Ecstasy), urine              Cutoff 500 ng/mL Cocaine Metabolite, urine          Cutoff 300 ng/mL Opiate + metabolites, urine        Cutoff 300 ng/mL Phencyclidine (PCP), urine         Cutoff 25 ng/mL Cannabinoid, urine                 Cutoff 50 ng/mL Barbiturates +  metabolites, urine  Cutoff 200 ng/mL Benzodiazepine, urine              Cutoff 200 ng/mL Methadone, urine                   Cutoff 300 ng/mL The urine drug screen provides only a preliminary, unconfirmed analytical test result and should not be used for non-medical purposes. Clinical consideration and professional judgment should be applied to any positive drug screen result due to possible interfering substances. A more specific alternate chemical method must be used in order to obtain a confirmed analytical result. Gas chromatography / mass spectrometry (GC/MS) is the preferred confirmat ory method. Performed at [facility], [address] [place], [place] [address]   Valproic acid level     Status: Abnormal   Collection Time: 02/16/18  5:50 PM  Result Value Ref Range   Valproic Acid Lvl 38 (L) 50.0 - 100.0 ug/mL    Comment: Performed at [facility], [address], [place], [place] [address]    Current Facility-Administered Medications  Medication Dose Route Frequency Provider Last Rate Last Dose  . alum & mag hydroxide-simeth (MAALOX/MYLANTA) 200-200-20 MG/5ML suspension 30 mL  30 mL Oral Q8H  PRN Eula Listen, MD      . ARIPiprazole (ABILIFY) tablet 5 mg  5 mg Oral Daily Pucilowska, Jolanta B, MD   5 mg at 02/18/18 0910  . aspirin EC tablet 81 mg  81 mg Oral Daily Pucilowska, Jolanta B, MD   81 mg at 02/18/18 0910  . clonazePAM (KLONOPIN) tablet 0.5 mg  0.5 mg Oral BID Eula Listen, MD   0.5 mg at 02/18/18 0910  . divalproex (DEPAKOTE) DR tablet 500 mg  500 mg Oral Q12H Pucilowska, Jolanta B, MD   500 mg at 02/18/18 0909  . escitalopram (LEXAPRO) tablet 20 mg  20 mg Oral Daily Pucilowska, Jolanta B, MD   20 mg at 02/18/18 0910  . fenofibrate tablet 160 mg  160 mg Oral Daily Pucilowska, Jolanta B, MD   160 mg at 02/18/18 0908  . folic acid (FOLVITE) tablet 1 mg  1 mg Oral Daily Eula Listen, MD   1 mg at 02/18/18 0910  . furosemide  (LASIX) tablet 20 mg  20 mg Oral Daily Pucilowska, Jolanta B, MD   20 mg at 02/18/18 0909  . levothyroxine (SYNTHROID, LEVOTHROID) tablet 50 mcg  50 mcg Oral QAC breakfast Eula Listen, MD   50 mcg at 02/18/18 0750  . loratadine (CLARITIN) tablet 10 mg  10 mg Oral Daily Eula Listen, MD   10 mg at 02/18/18 0910  . mometasone-formoterol (DULERA) 200-5 MCG/ACT inhaler 2 puff  2 puff Inhalation BID Eula Listen, MD   2 puff at 02/18/18 0751  . oxybutynin (DITROPAN-XL) 24 hr tablet 5 mg  5 mg Oral QHS Pucilowska, Jolanta B, MD   5 mg at 02/18/18 0339  . pantoprazole (PROTONIX) EC tablet 40 mg  40 mg Oral Daily Pucilowska, Jolanta B, MD   40 mg at 02/18/18 0909  . polyethylene glycol (MIRALAX / GLYCOLAX) packet 17 g  17 g Oral Daily Mariea Clonts, Anne-Caroline, MD      . potassium chloride (K-DUR,KLOR-CON) CR tablet 10 mEq  10 mEq Oral Daily Eula Listen, MD   10 mEq at 02/18/18 0909  . sucralfate (CARAFATE) tablet 1 g  1 g Oral TID WC & HS Eula Listen, MD   1 g at 02/18/18 0751  . vitamin B-12 (CYANOCOBALAMIN) tablet 1,000 mcg  1,000 mcg Oral Daily Eula Listen, MD   1,000 mcg at 02/18/18 3875   Current Outpatient Medications  Medication Sig Dispense Refill  . alum & mag hydroxide-simeth (MAALOX/MYLANTA) 200-200-20 MG/5ML suspension Take 30 mLs by mouth every 8 (eight) hours as needed for indigestion or heartburn. 355 mL 1  . ARIPiprazole (ABILIFY) 5 MG tablet Take 1 tablet (5 mg total) by mouth daily. 30 tablet 1  . aspirin EC 81 MG tablet Take 1 tablet (81 mg total) by mouth daily. 30 tablet 1  . cetirizine (ZYRTEC) 10 MG tablet Take 1 tablet (10 mg total) by mouth daily. 30 tablet 1  . clonazePAM (KLONOPIN) 0.5 MG tablet Take 1 tablet (0.5 mg total) by mouth 2 (two) times daily. 60 tablet 1  . divalproex (DEPAKOTE) 500 MG DR tablet Take 1 tablet (500 mg total) by mouth 2 (two) times daily. 60 tablet 1  . escitalopram (LEXAPRO) 20 MG tablet Take 1  tablet (20 mg total) by mouth daily. 30 tablet 1  . fenofibrate 160 MG tablet Take 1 tablet (160 mg total) by mouth daily. 30 tablet 1  . Fluticasone-Salmeterol (ADVAIR) 250-50 MCG/DOSE AEPB Inhale 1 puff into the lungs 2 (two) times daily.  60 each 1  . folic acid (FOLVITE) 1 MG tablet Take 1 tablet (1 mg total) by mouth daily. 30 tablet 1  . furosemide (LASIX) 20 MG tablet Take 1 tablet (20 mg total) by mouth daily. 30 tablet 1  . levothyroxine (SYNTHROID, LEVOTHROID) 50 MCG tablet Take 1 tablet (50 mcg total) by mouth daily before breakfast. 30 tablet 1  . liver oil-zinc oxide (DESITIN) 40 % ointment Apply 1 application topically as needed for irritation. 56.7 g 0  . oxybutynin (DITROPAN-XL) 5 MG 24 hr tablet Take 1 tablet (5 mg total) by mouth daily. 30 tablet 1  . pantoprazole (PROTONIX) 40 MG tablet Take 1 tablet (40 mg total) by mouth daily. 30 tablet 1  . polyethylene glycol (MIRALAX / GLYCOLAX) packet Take 17 g by mouth daily as needed. 30 each 1  . potassium chloride (K-DUR) 10 MEQ tablet Take 1 tablet (10 mEq total) by mouth daily. 30 tablet 1  . risperiDONE (RISPERDAL) 3 MG tablet Take 1 tablet (3 mg total) by mouth 2 (two) times daily. 60 tablet 1  . sucralfate (CARAFATE) 1 g tablet Take 1 tablet (1 g total) by mouth 4 (four) times daily -  with meals and at bedtime. 120 tablet 1  . vitamin B-12 (CYANOCOBALAMIN) 1000 MCG tablet Take 1 tablet (1,000 mcg total) by mouth daily. 30 tablet 1    Musculoskeletal: Strength & Muscle Tone: within normal limits Gait & Station: normal Patient leans: N/A  Psychiatric Specialty Exam: Physical Exam  Nursing note and vitals reviewed. Constitutional: She appears well-developed and well-nourished.  HENT:  Head: Normocephalic and atraumatic.  Eyes: Pupils are equal, round, and reactive to light. Conjunctivae are normal.  Neck: Normal range of motion.  Cardiovascular: Regular rhythm and normal heart sounds.  Respiratory: Effort normal. No  respiratory distress.  GI: Soft.  Musculoskeletal: Normal range of motion.  Neurological: She is alert.  Skin: Skin is warm and dry.  Psychiatric: Her affect is blunt. Her speech is delayed. She is slowed. She expresses inappropriate judgment. She exhibits abnormal recent memory and abnormal remote memory.    Review of Systems  Constitutional: Negative.   HENT: Negative.   Eyes: Negative.   Respiratory: Negative.   Cardiovascular: Negative.   Gastrointestinal: Negative.   Musculoskeletal: Negative.   Skin: Negative.   Neurological: Negative.   Psychiatric/Behavioral: Positive for depression, hallucinations and suicidal ideas. Negative for memory loss and substance abuse. The patient is nervous/anxious. The patient does not have insomnia.     Blood pressure 123/68, pulse 67, temperature 97.8 F (36.6 C), temperature source Oral, resp. rate 18, height '5\' 3"'  (1.6 m), weight 83.9 kg, SpO2 100 %.Body mass index is 32.77 kg/m.  General Appearance: Casual  Eye Contact:  Fair  Speech:  Slow  Volume:  Decreased  Mood:  Dysphoric  Affect:  Appropriate  Thought Process:  Coherent  Orientation:  Full (Time, Place, and Person)  Thought Content:  Illogical  Suicidal Thoughts:  Yes.  without intent/plan  Homicidal Thoughts:  Yes.  without intent/plan  Memory:  Immediate;   Fair Recent;   Fair Remote;   Fair  Judgement:  Impaired  Insight:  Shallow  Psychomotor Activity:  Decreased  Concentration:  Concentration: Poor  Recall:  [place] of Knowledge:  Poor  Language:  Fair  Akathisia:  No  Handed:  Right  AIMS (if indicated):     Assets:  Desire for Improvement Financial Resources/Insurance Housing Resilience  ADL's:  Intact  Cognition:  Impaired,  Mild  Sleep:        Treatment Plan Summary: Plan 52 year old woman with chronic intellectual disability and chronic behavior problems with mood instability.  As usual she continues to state that she is "suicidal and homicidal".   When I questioned her about why she would actually want to die she will come up with a variety of trivial complaints about various things but will not talk about actually feeling like she is better off dead.  When asked whether she is actually homicidal at anyone in particular she cannot come up with anyone and does not display any anger or paranoia about anyone in particular.  Patient in other words is at her baseline mental state and is not likely to benefit from hospital level treatment.  I informed her that I recommend that she go back to [place] where she currently lives and give it another try and carry on and she took this with complete calm and simply asked to have someone called to pick her up.  Case reviewed with emergency room doctor and TTS.  Recommend discharge and follow-up in the community.  Disposition: No evidence of imminent risk to self or others at present.   Patient does not meet criteria for psychiatric inpatient admission. Supportive therapy provided about ongoing stressors. Discussed crisis plan, support from social network, calling 911, coming to the Emergency Department, and calling Suicide Hotline.  Alethia Berthold, MD 02/18/2018 1:35 PM

## 2018-02-18 NOTE — ED Notes (Signed)
RN spoke with group home staff. Patient will be picked up within the next hour.

## 2018-02-18 NOTE — ED Notes (Signed)
Pt dressing for discharge. Maintained on 15 minute checks and observation by security camera for safety. 

## 2018-02-18 NOTE — Discharge Instructions (Addendum)
You have been seen in the emergency department for a  psychiatric concern. You have been evaluated both medically as well as psychiatrically. Please follow-up with your outpatient resources provided. Return to the emergency department for any worsening symptoms, or any thoughts of hurting yourself or anyone else so that we may attempt to help you. 

## 2018-02-18 NOTE — ED Notes (Signed)
Pt asking RN multiple times when she will be moved to BMU.  Needing frequent re-direction.   Maintained on 15 minute checks and observation by security camera for safety.

## 2018-02-18 NOTE — ED Notes (Signed)
Patient sitting in dayroom. No noted distress or abnormal behaviors noted. Will continue 15 minute checks and observation by security camera for safety. 

## 2018-02-18 NOTE — ED Notes (Signed)
Pt discharged back to group home in care of group home caregiver.  VS stable. All belongings returned to patient. Pt accepting of disposition. Discharge paperwork reviewed with patient. Pt signed for discharge paperwork, (Pt is her own guardian).

## 2018-02-18 NOTE — ED Provider Notes (Addendum)
-----------------------------------------   8:26 AM on 02/18/2018 -----------------------------------------   Blood pressure 107/87, pulse 73, temperature 97.9 F (36.6 C), temperature source Oral, resp. rate 16, height 5\' 3"  (1.6 m), weight 83.9 kg, SpO2 100 %.  The patient had no acute events since last update.  Calm and cooperative at this time.  Disposition is pending Psychiatry/Behavioral Medicine team recommendations.     Minna Antis, MD 02/18/18 [phone]   Patient has been seen by psychiatry this morning they believe the patient is safe for discharge home from psychiatric standpoint.  Medical work-up is been nonrevealing.  Patient will be discharged.   Minna Antis, MD 02/18/18 1456

## 2018-02-18 NOTE — ED Notes (Addendum)
Pt to be discharged back to group home. RN called [facility] [phone]) and left a message requesting a return phone call.

## 2018-02-18 NOTE — ED Notes (Signed)
RN attempted to call [facility] (2nd time).  No answer.

## 2018-03-08 ENCOUNTER — Encounter: Payer: Self-pay | Admitting: Emergency Medicine

## 2018-03-08 ENCOUNTER — Emergency Department
Admission: EM | Admit: 2018-03-08 | Discharge: 2018-03-09 | Disposition: A | Discharge status: Home / Self Care | Payer: Medicare Other | Attending: Emergency Medicine | Admitting: Emergency Medicine

## 2018-03-08 DIAGNOSIS — Z7982 Long term (current) use of aspirin: Secondary | ICD-10-CM | POA: Insufficient documentation

## 2018-03-08 DIAGNOSIS — F70 Mild intellectual disabilities: Secondary | ICD-10-CM

## 2018-03-08 DIAGNOSIS — J45909 Unspecified asthma, uncomplicated: Secondary | ICD-10-CM | POA: Insufficient documentation

## 2018-03-08 DIAGNOSIS — F259 Schizoaffective disorder, unspecified: Secondary | ICD-10-CM | POA: Diagnosis not present

## 2018-03-08 DIAGNOSIS — F332 Major depressive disorder, recurrent severe without psychotic features: Secondary | ICD-10-CM | POA: Diagnosis not present

## 2018-03-08 DIAGNOSIS — F603 Borderline personality disorder: Secondary | ICD-10-CM | POA: Diagnosis not present

## 2018-03-08 DIAGNOSIS — R45850 Homicidal ideations: Secondary | ICD-10-CM | POA: Insufficient documentation

## 2018-03-08 DIAGNOSIS — G809 Cerebral palsy, unspecified: Secondary | ICD-10-CM | POA: Diagnosis present

## 2018-03-08 DIAGNOSIS — R45851 Suicidal ideations: Secondary | ICD-10-CM | POA: Insufficient documentation

## 2018-03-08 DIAGNOSIS — Z046 Encounter for general psychiatric examination, requested by authority: Secondary | ICD-10-CM | POA: Diagnosis present

## 2018-03-08 DIAGNOSIS — F419 Anxiety disorder, unspecified: Secondary | ICD-10-CM | POA: Diagnosis not present

## 2018-03-08 DIAGNOSIS — G3184 Mild cognitive impairment, so stated: Secondary | ICD-10-CM | POA: Insufficient documentation

## 2018-03-08 DIAGNOSIS — Z79899 Other long term (current) drug therapy: Secondary | ICD-10-CM | POA: Diagnosis not present

## 2018-03-08 DIAGNOSIS — F32A Depression, unspecified: Secondary | ICD-10-CM

## 2018-03-08 DIAGNOSIS — F329 Major depressive disorder, single episode, unspecified: Secondary | ICD-10-CM

## 2018-03-08 NOTE — ED Notes (Signed)
This [facility] at this time.

## 2018-03-08 NOTE — ED Notes (Signed)
Patient gave permission for this RN to [facility] of [facility] but states she does not want this RN to notify law enforcement.

## 2018-03-08 NOTE — ED Notes (Signed)
This RN continues to be on [facility] with [facility].
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
## 2018-03-08 NOTE — ED Notes (Signed)
[place] to [place] incident.
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
## 2018-03-08 NOTE — Discharge Instructions (Signed)
You have been seen in the emergency department for a  psychiatric concern. You have been evaluated both medically as well as psychiatrically. Please follow-up with your outpatient resources provided. Return to the emergency department for any worsening symptoms, or any thoughts of hurting yourself or anyone else so that we may attempt to help you. 
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
## 2018-03-08 NOTE — ED Notes (Signed)
This RN spoke to staff at group home who [facility] [place] and confirmed that patient is her own guardian.

## 2018-03-08 NOTE — ED Notes (Signed)
Called Group [phone] talked to "Corrie DandyMary" stated she would give message to morning crew.  Would be able to pick up after 7a.m.
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
## 2018-03-08 NOTE — ED Notes (Signed)
[facility] planning/management officerpolice officer called and requested to talk to patient.  Patient able to talk to police officer over phone.  Pt. Talked to officer for about 5 minutes.  Pt. Appears satisfied with call.

## 2018-03-09 NOTE — ED Notes (Signed)
Pt discharged to lobby in care of group home staff. VS stable. Discharge paperwork reviewed with patient. Patient signed for her discharge. All belongings returned to patient. Pt denies SI/H.

## 2018-03-09 NOTE — ED Notes (Signed)
VS stable. Pt dressing for discharge. Group home staff waiting in lobby.   Maintained on 15 minute checks and observation by security camera for safety.

## 2018-03-12 ENCOUNTER — Other Ambulatory Visit: Payer: Self-pay | Admitting: Family Medicine

## 2018-03-12 DIAGNOSIS — Z1231 Encounter for screening mammogram for malignant neoplasm of breast: Secondary | ICD-10-CM

## 2018-03-22 ENCOUNTER — Emergency Department
Admission: EM | Admit: 2018-03-22 | Discharge: 2018-03-23 | Disposition: A | Discharge status: Other Acute Inpatient Hospital | Payer: Medicare Other | Attending: Emergency Medicine | Admitting: Emergency Medicine

## 2018-03-22 ENCOUNTER — Encounter: Payer: Self-pay | Admitting: Emergency Medicine

## 2018-03-22 DIAGNOSIS — J45909 Unspecified asthma, uncomplicated: Secondary | ICD-10-CM | POA: Insufficient documentation

## 2018-03-22 DIAGNOSIS — F329 Major depressive disorder, single episode, unspecified: Secondary | ICD-10-CM | POA: Diagnosis present

## 2018-03-22 DIAGNOSIS — J449 Chronic obstructive pulmonary disease, unspecified: Secondary | ICD-10-CM | POA: Insufficient documentation

## 2018-03-22 DIAGNOSIS — G3184 Mild cognitive impairment, so stated: Secondary | ICD-10-CM | POA: Diagnosis not present

## 2018-03-22 DIAGNOSIS — Z87891 Personal history of nicotine dependence: Secondary | ICD-10-CM | POA: Insufficient documentation

## 2018-03-22 DIAGNOSIS — R45851 Suicidal ideations: Secondary | ICD-10-CM | POA: Diagnosis not present

## 2018-03-22 NOTE — ED Provider Notes (Signed)
[facility] Emergency Department Provider Note ____________________________________________   First MD Initiated Contact with Patient 03/22/18 2153     (approximate)  I have reviewed the triage vital signs and the nursing notes.   HISTORY  Chief Complaint Psychiatric Evaluation  Level 5 caveat: History of present illness limited due to poor historian  HPI Afia Messenger is a 52 y.o. female with PMH as noted below who presents with suicidal ideation.  The patient is unable to tell me exactly how long it has been, except that it is for a while.  She states that she wants to stab herself.  She denies any acute medical symptoms.   Past Medical History:  Diagnosis Date  . Anxiety   . Asthma   . Borderline personality disorder (HCC)   . Cerebral palsy (HCC)   . COPD (chronic obstructive pulmonary disease) (HCC)   . Depression   . GERD (gastroesophageal reflux disease)   . Mild cognitive impairment   . Schizoaffective disorder (HCC)   . Wound abscess     Patient Active Problem List   Diagnosis Date Noted  . Severe recurrent major depression without psychotic features (HCC) 08/10/2016  . Overdose by acetaminophen 08/08/2016  . Schizoaffective disorder, bipolar type (HCC)   . 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 GERD (gastroesophageal reflux disease) 09/24/2014  . COPD (chronic obstructive pulmonary disease) (HCC) 09/24/2014  . Borderline personality disorder (HCC) 09/24/2014  . Cerebral palsy (HCC) 08/31/2014    Past Surgical History:  Procedure Laterality Date  . CHOLECYSTECTOMY    . INCISION AND DRAINAGE ABSCESS Right 01/02/2015   Procedure: INCISION AND DRAINAGE ABSCESS;  Surgeon: Tiney Rouge III, MD;  Location: [facility] ORS;  Service: General;  Laterality: Right;  . TONSILLECTOMY      Prior to Admission medications   Medication Sig Start Date End Date Taking? Authorizing Provider  alum & mag hydroxide-simeth (MAALOX/MYLANTA) 200-200-20  MG/5ML suspension Take 30 mLs by mouth every 8 (eight) hours as needed for indigestion or heartburn. 12/14/17   Clapacs, Jackquline Denmark, MD  ARIPiprazole (ABILIFY) 5 MG tablet Take 1 tablet (5 mg total) by mouth daily. 12/14/17   Clapacs, Jackquline Denmark, MD  aspirin EC 81 MG tablet Take 1 tablet (81 mg total) by mouth daily. 12/14/17   Clapacs, Jackquline Denmark, MD  cetirizine (ZYRTEC) 10 MG tablet Take 1 tablet (10 mg total) by mouth daily. 12/14/17   Clapacs, Jackquline Denmark, MD  clonazePAM (KLONOPIN) 0.5 MG tablet Take 1 tablet (0.5 mg total) by mouth 2 (two) times daily. 12/14/17   Clapacs, Jackquline Denmark, MD  divalproex (DEPAKOTE) 500 MG DR tablet Take 1 tablet (500 mg total) by mouth 2 (two) times daily. 12/14/17   Clapacs, Jackquline Denmark, MD  escitalopram (LEXAPRO) 20 MG tablet Take 1 tablet (20 mg total) by mouth daily. 12/14/17   Clapacs, Jackquline Denmark, MD  fenofibrate 160 MG tablet Take 1 tablet (160 mg total) by mouth daily. 12/14/17   Clapacs, Jackquline Denmark, MD  Fluticasone-Salmeterol (ADVAIR) 250-50 MCG/DOSE AEPB Inhale 1 puff into the lungs 2 (two) times daily. 12/14/17   Clapacs, Jackquline Denmark, MD  folic acid (FOLVITE) 1 MG tablet Take 1 tablet (1 mg total) by mouth daily. 12/14/17   Clapacs, Jackquline Denmark, MD  furosemide (LASIX) 20 MG tablet Take 1 tablet (20 mg total) by mouth daily. 12/14/17   Clapacs, Jackquline Denmark, MD  levothyroxine (SYNTHROID, LEVOTHROID) 50 MCG tablet Take 1 tablet (50 mcg total) by mouth daily before breakfast.  12/14/17   Clapacs, Jackquline DenmarkJohn T, MD  liver oil-zinc oxide (DESITIN) 40 % ointment Apply 1 application topically as needed for irritation. 01/25/18   Vena AustriaStaebler, Andreas, MD  oxybutynin (DITROPAN-XL) 5 MG 24 hr tablet Take 1 tablet (5 mg total) by mouth daily. 12/14/17   Clapacs, Jackquline DenmarkJohn T, MD  pantoprazole (PROTONIX) 40 MG tablet Take 1 tablet (40 mg total) by mouth daily. 12/14/17   Clapacs, Jackquline DenmarkJohn T, MD  polyethylene glycol (MIRALAX / GLYCOLAX) packet Take 17 g by mouth daily as needed. 12/14/17   Clapacs, Jackquline DenmarkJohn T, MD  potassium chloride (K-DUR) 10 MEQ tablet Take  1 tablet (10 mEq total) by mouth daily. 12/14/17   Clapacs, Jackquline DenmarkJohn T, MD  risperiDONE (RISPERDAL) 3 MG tablet Take 1 tablet (3 mg total) by mouth 2 (two) times daily. 12/14/17   Clapacs, Jackquline DenmarkJohn T, MD  sucralfate (CARAFATE) 1 g tablet Take 1 tablet (1 g total) by mouth 4 (four) times daily -  with meals and at bedtime. 12/14/17   Clapacs, Jackquline DenmarkJohn T, MD  vitamin B-12 (CYANOCOBALAMIN) 1000 MCG tablet Take 1 tablet (1,000 mcg total) by mouth daily. 12/14/17   Clapacs, Jackquline DenmarkJohn T, MD    Allergies Penicillins  Family History  Problem Relation Age of Onset  . Heart attack Father   . Diabetes Mother     Social History Social History   Tobacco Use  . Smoking status: Former Games developermoker  . Smokeless tobacco: Never Used  Substance Use Topics  . Alcohol use: No  . Drug use: No    Review of Systems  Constitutional: No fever. Eyes: No redness. ENT: No neck pain. Cardiovascular: Denies chest pain. Respiratory: Denies shortness of breath. Gastrointestinal: No vomiting. Genitourinary: Negative for flank pain.  Musculoskeletal: Negative for back pain. Skin: Negative for rash. Neurological: Negative for headache.   ____________________________________________   PHYSICAL EXAM:  VITAL SIGNS: ED Triage Vitals  Enc Vitals Group     BP 03/22/18 2020 118/70     Pulse Rate 03/22/18 2019 87     Resp 03/22/18 2019 18     Temp 03/22/18 2019 98.2 F (36.8 C)     Temp Source 03/22/18 2019 Oral     SpO2 03/22/18 2019 94 %     Weight 03/22/18 2019 193 lb (87.5 kg)     Height 03/22/18 2019 5\' 3"  (1.6 m)     Head Circumference --      Peak Flow --      Pain Score 03/22/18 2019 0     Pain Loc --      Pain Edu? --      Excl. in GC? --     Constitutional: Alert and oriented. Well appearing and in no acute distress. Eyes: Conjunctivae are normal.  Head: Atraumatic. Nose: No congestion/rhinnorhea. Mouth/Throat: Mucous membranes are moist.   Neck: Normal range of motion.  Cardiovascular: Good peripheral  circulation. Respiratory: Normal respiratory effort.  Gastrointestinal: No distention.  Musculoskeletal: No lower extremity edema.  Extremities warm and well perfused.  Neurologic:  Normal speech and language. No gross focal neurologic deficits are appreciated.  Skin:  No rash noted. Psychiatric: Calm and cooperative.  ____________________________________________   LABS (all labs ordered are listed, but only abnormal results are displayed)  Labs Reviewed - No data to display ____________________________________________  EKG   ____________________________________________  RADIOLOGY    ____________________________________________   PROCEDURES  Procedure(s) performed: No  Procedures  Critical Care performed: No ____________________________________________   INITIAL IMPRESSION / ASSESSMENT AND PLAN /  ED COURSE  Pertinent labs & imaging results that were available during my care of the patient were reviewed by me and considered in my medical decision making (see chart for details).  52 year old female with PMH as noted above presents with suicidal ideation.  She denies any acute medical complaints.  On exam, the vital signs are normal.  The patient is calm and cooperative.  We will obtain TTS evaluation for further recommendations.  She is under involuntary commitment.   ____________________________________________   FINAL CLINICAL IMPRESSION(S) / ED DIAGNOSES  Final diagnoses:  Suicidal ideation      NEW MEDICATIONS STARTED DURING THIS VISIT:  New Prescriptions   No medications on file     Note:  This document was prepared using Dragon voice recognition software and may include unintentional dictation errors.    Dionne Bucy, MD 03/22/18 2322

## 2018-03-22 NOTE — ED Triage Notes (Addendum)
Patient brought in by [employer] for IVC for SI. Patient states that she wants to kill herself. Patient states that she would stab herself.  Patient from a group home. The address is [address]. Patient does not know the name of the group home.

## 2018-03-22 NOTE — ED Notes (Signed)
Pt. Asked "Why are you here?"  Pt. Responded "People keep telling me what I need to do, I want to be under the ground".  Pt. Calm and cooperative.  Pt. Endorses SI thoughts, pt. Denies HI thoughts.  Pt. Denies AV/H

## 2018-03-22 NOTE — ED Notes (Addendum)
Patient changed into hospital scrubs. Patient has sweatshirt, pants, bra, socks, shoes,watch, 2 rings, wallet and purse. Belongings given to primary RN.

## 2018-03-23 DIAGNOSIS — G3184 Mild cognitive impairment, so stated: Secondary | ICD-10-CM | POA: Diagnosis not present

## 2018-03-23 NOTE — BH Assessment (Signed)
Assessment Note  [name] is an 52 y.o. female. Who presented directly from the group home where she states that she called the police department due to experiencing thoughts of suicide. Pt is well known to the service and has had multiple representations with similar complaints. Patient reports that she is her own guardian and that she's currently receiving mental health services at [facility]. PMH of mild Intellectual disability, schizophrenia and depressive disorder. Patient presents as  restless and reports an inability to maintain the restful sleep. She endorsed experiencing both visual and auditory hallucinations. She continues to explain that her auditory hallucinations are at command in nature and tell her to hurt herself. Pt unable to contract for safety outside of the facility.    Diagnosis: Mild cognitive impairment and Depressive Disorder  Past Medical History:  Past Medical History:  Diagnosis Date  . Anxiety   . Asthma   . Borderline personality disorder (HCC)   . Cerebral palsy (HCC)   . COPD (chronic obstructive pulmonary disease) (HCC)   . Depression   . GERD (gastroesophageal reflux disease)   . Mild cognitive impairment   . Schizoaffective disorder (HCC)   . Wound abscess     Past Surgical History:  Procedure Laterality Date  . CHOLECYSTECTOMY    . INCISION AND DRAINAGE ABSCESS Right 01/02/2015   Procedure: INCISION AND DRAINAGE ABSCESS;  Surgeon: Tiney Rouge III, MD;  Location: [facility] ORS;  Service: General;  Laterality: Right;  . TONSILLECTOMY      Family History:  Family History  Problem Relation Age of Onset  . Heart attack Father   . Diabetes Mother     Social History:  reports that she has quit smoking. She has never used smokeless tobacco. She reports that she does not drink alcohol or use drugs.  Additional Social History:  Alcohol / Drug Use Pain Medications: SEE PTA  Prescriptions: SEE PTA  Over the Counter: SEE PTA  History of  alcohol / drug use?: No history of alcohol / drug abuse Longest period of sobriety (when/how long): None reported   CIWA: CIWA-Ar BP: 118/70 Pulse Rate: 87 COWS:    Allergies:  Allergies  Allergen Reactions  . Penicillins Nausea And Vomiting and Other (See Comments)    Reaction:  No  Has patient had a PCN reaction causing immediate rash, facial/tongue/throat swelling, SOB or lightheadedness with hypotension: No Has patient had a PCN reaction causing severe rash involving mucus membranes or skin necrosis: No Has patient had a PCN reaction that required hospitalization No Has patient had a PCN reaction occurring within the last 10 years: No If all of the above answers are "NO", then may proceed with Cephalosporin use.     Home Medications:  (Not in a hospital admission)  OB/GYN Status:  No LMP recorded. Patient is postmenopausal.  General Assessment Data Location of Assessment: [facility] ED TTS Assessment: In system Is this a Tele or Face-to-Face Assessment?: Face-to-Face Is this an Initial Assessment or a Re-assessment for this encounter?: Initial Assessment Patient Accompanied by:: N/A Living Arrangements: Other (Comment) What gender do you identify as?: Female Marital status: Single Living Arrangements: Group Home Can pt return to current living arrangement?: Yes Admission Status: Voluntary Referral Source: (Group home) Insurance type: Medicare and medicaid   Medical Screening Exam [facility] Walk-in ONLY) Medical Exam completed: Yes  Crisis Care Plan Living Arrangements: Group Home Name of Psychiatrist: Unknown  Name of Therapist: none   Education Status Is patient currently in school?: No  Is the patient employed, unemployed or receiving disability?: Receiving disability income  Risk to self with the past 6 months Suicidal Ideation: Yes-Currently Present Has patient been a risk to self within the past 6 months prior to admission? : Yes Suicidal Intent: Yes-Currently  Present Has patient had any suicidal intent within the past 6 months prior to admission? : Yes Is patient at risk for suicide?: Yes Suicidal Plan?: Yes-Currently Present Has patient had any suicidal plan within the past 6 months prior to admission? : Yes Specify Current Suicidal Plan: stab self  Access to Means: Yes What has been your use of drugs/alcohol within the last 12 months?: none Previous Attempts/Gestures: Yes How many times?: (Multiple) Other Self Harm Risks: UTA Triggers for Past Attempts: Unknown Intentional Self Injurious Behavior: (UTA) Family Suicide History: Unable to assess Recent stressful life event(s): (UTA) Persecutory voices/beliefs?: No Depression: Yes Depression Symptoms: (UTA) Substance abuse history and/or treatment for substance abuse?: No Suicide prevention information given to non-admitted patients: Yes  Risk to Others within the past 6 months Homicidal Ideation: No Does patient have any lifetime risk of violence toward others beyond the six months prior to admission? : No Thoughts of Harm to Others: No Current Homicidal Intent: No Current Homicidal Plan: No Access to Homicidal Means: No Identified Victim: None reported  History of harm to others?: No Assessment of Violence: None Noted Does patient have access to weapons?: No Criminal Charges Pending?: No Does patient have a court date: No Is patient on probation?: No  Psychosis Hallucinations: Auditory, Visual Delusions: None noted  Mental Status Report Appearance/Hygiene: In scrubs Eye Contact: Poor Motor Activity: Psychomotor retardation Speech: Slow, Slurred Level of Consciousness: Restless Mood: Depressed Affect: Flat Anxiety Level: Minimal Thought Processes: Unable to Assess Judgement: Impaired Orientation: Person, Situation, Place Obsessive Compulsive Thoughts/Behaviors: None  Cognitive Functioning Concentration: Poor Memory: Unable to Assess Is patient IDD: Yes Level of  Function: UTA Insight: Poor Appetite: Fair Have you had any weight changes? : (UTA) Sleep: Unable to Assess Total Hours of Sleep: 4 Vegetative Symptoms: Unable to Assess  ADLScreening [facility] Assessment Services) Patient's cognitive ability adequate to safely complete daily activities?: Yes Patient able to express need for assistance with ADLs?: Yes Independently performs ADLs?: (Unknown )  Prior Inpatient Therapy Prior Inpatient Therapy: Yes Prior Therapy Dates: unknown(multiple presentations to ER) Prior Therapy Facilty/Provider(s): unknown Reason for Treatment: IDD and Depression   Prior Outpatient Therapy Prior Outpatient Therapy: No Does patient have an ACCT team?: No Does patient have Intensive In-House Services?  : Unknown Does patient have [facility] services? : Unknown Does patient have P4CC services?: Unknown  ADL Screening (condition at time of admission) Patient's cognitive ability adequate to safely complete daily activities?: Yes Patient able to express need for assistance with ADLs?: Yes Independently performs ADLs?: (Unknown )       Abuse/Neglect Assessment (Assessment to be complete while patient is alone) Abuse/Neglect Assessment Can Be Completed: Unable to assess, patient is non-responsive or altered mental status     Advance Directives (For Healthcare) Does Patient Have a Medical Advance Directive?: No          Disposition:  Disposition Initial Assessment Completed for this Encounter: Yes Patient [facility]: Other (Comment)([facility]  On Site Evaluation by:   Reviewed with Physician:    Asa SaunasShawanna N Mateusz Neilan 03/23/2018 1:32 AM

## 2018-03-23 NOTE — ED Notes (Signed)
She has been accepted [facility]  She is IVC  Transportation arrangements in progress

## 2018-03-23 NOTE — ED Notes (Signed)
BEHAVIORAL HEALTH ROUNDING Patient sleeping: No. Patient alert and oriented: yes Behavior appropriate: Yes.  ; If no, describe:  Nutrition and fluids offered: yes Toileting and hygiene offered: Yes  Sitter present: q15 minute observations and security  monitoring Law enforcement present: Yes  ODS  

## 2018-03-23 NOTE — ED Notes (Signed)
Staff asked patient to sign discharge paper; patient stated, "I don't want to and walked away."

## 2018-03-23 NOTE — ED Provider Notes (Signed)
-----------------------------------------   7:33 AM on 03/23/2018 -----------------------------------------   Blood pressure 118/70, pulse 87, temperature 98.2 F (36.8 C), temperature source Oral, resp. rate 18, height 5\' 3"  (1.6 m), weight 87.5 kg, SpO2 94 %.  The patient had no acute events since last update.  Calm and cooperative at this time.  Disposition is pending Psychiatry/Behavioral Medicine team recommendations.     Arnaldo NatalMalinda, Braxden Lovering F, MD 03/23/18 [phone]

## 2018-03-23 NOTE — ED Provider Notes (Signed)
 -----------------------------------------   6:33 PM on 03/23/2018 -----------------------------------------  Patient [facility] for further care of her depression and suicidal thoughts.  She remains medically stable for transfer and ongoing psychiatric care.   Sharman CheekStafford, Honi Name, MD 03/23/18 [phone]

## 2018-03-23 NOTE — BH Assessment (Addendum)
Patient has been accepted [facility]  [facility]. Accepting physician is Dr. Vallery RidgeElizabeth Oates.  Call report to [phone].  Representative was Allstatemanda.   ER Staff is aware of it:  Kennith Centerracey, ER Secretary  Dr. Darnelle CatalanMalinda, ER MD  Toniann FailWendy, Patient's Nurse     Patient's Family/Support System ([place]-[phone] / Ms. Little-[phone]) have been updated as well.

## 2018-03-23 NOTE — ED Notes (Signed)
SOC in progress.  

## 2018-03-23 NOTE — ED Notes (Signed)
Patient is sitting in the dayroom, she is cooperative,no signs of distress, Patient did states that she is still having Si thoughts but without a plan at this time. Will continue to monitor.

## 2018-03-23 NOTE — ED Notes (Signed)
Patient ate 100% of breakfast, and beverage, Patient just ask " am I going downstairs" Nurse let her know that she has not had her SOC yet, she said " Ok", Patient is cooperative, but states that she feels SI and wants to stab self. Nurse will continue to monitor, q15 minute checks and camera surveillance in progress for safety.

## 2018-03-30 ENCOUNTER — Other Ambulatory Visit: Payer: Self-pay

## 2018-03-30 ENCOUNTER — Emergency Department
Admission: EM | Admit: 2018-03-30 | Discharge: 2018-03-31 | Disposition: A | Discharge status: Other Acute Inpatient Hospital | Payer: Medicare Other | Attending: Emergency Medicine | Admitting: Emergency Medicine

## 2018-03-30 DIAGNOSIS — R45851 Suicidal ideations: Secondary | ICD-10-CM | POA: Insufficient documentation

## 2018-03-30 DIAGNOSIS — F332 Major depressive disorder, recurrent severe without psychotic features: Secondary | ICD-10-CM | POA: Diagnosis present

## 2018-03-30 DIAGNOSIS — G809 Cerebral palsy, unspecified: Secondary | ICD-10-CM | POA: Insufficient documentation

## 2018-03-30 DIAGNOSIS — Z7982 Long term (current) use of aspirin: Secondary | ICD-10-CM | POA: Insufficient documentation

## 2018-03-30 DIAGNOSIS — F3164 Bipolar disorder, current episode mixed, severe, with psychotic features: Secondary | ICD-10-CM

## 2018-03-30 DIAGNOSIS — Z79899 Other long term (current) drug therapy: Secondary | ICD-10-CM | POA: Insufficient documentation

## 2018-03-30 DIAGNOSIS — J449 Chronic obstructive pulmonary disease, unspecified: Secondary | ICD-10-CM | POA: Diagnosis not present

## 2018-03-30 DIAGNOSIS — Z87891 Personal history of nicotine dependence: Secondary | ICD-10-CM | POA: Diagnosis not present

## 2018-03-30 NOTE — ED Notes (Signed)
BEHAVIORAL HEALTH ROUNDING  Patient sleeping: No.  Patient alert and oriented: yes  Behavior appropriate: Yes. ; If no, describe:  Nutrition and fluids offered: Yes  Toileting and hygiene offered: Yes  Sitter present: not applicable, Q 15 min safety rounds and observation via security camera. Law enforcement present: Yes ODS  

## 2018-03-30 NOTE — ED Notes (Addendum)
Pt brought into ED BHU via sally port and wand with metal detector for safety by [employer] officer. Patient oriented to unit/care area: Pt informed of unit policies and procedures.  Informed that, for their safety, care areas are designed for safety and monitored by security cameras at all times; and visiting hours explained to patient. Patient verbalizes understanding, and verbal contract for safety obtained.Pt shown to their room.   ENVIRONMENTAL ASSESSMENT  Potentially harmful objects out of patient reach: Yes.  Personal belongings secured: Yes.  Patient dressed in hospital provided attire only: Yes.  Plastic bags out of patient reach: Yes.  Patient care equipment (cords, cables, call bells, lines, and drains) shortened, removed, or accounted for: Yes.  Equipment and supplies removed from bottom of stretcher: Yes.  Potentially toxic materials out of patient reach: Yes.  Sharps container removed or out of patient reach: Yes.   BEHAVIORAL HEALTH ROUNDING  Patient sleeping: No.  Patient alert and oriented: yes  Behavior appropriate: Yes. ; If no, describe:  Nutrition and fluids offered: Yes  Toileting and hygiene offered: Yes  Sitter present: not applicable, Q 15 min safety rounds and observation via security camera. Law enforcement present: Yes ODS  ED BHU PLACEMENT JUSTIFICATION  Is the patient under IVC or is there intent for IVC: Yes.  Is the patient medically cleared: Yes.  Is there vacancy in the ED BHU: Yes.  Is the population mix appropriate for patient: Yes.  Is the patient awaiting placement in inpatient or outpatient setting: Yes.  Has the patient had a psychiatric consult: Yes.  Survey of unit performed for contraband, proper placement and condition of furniture, tampering with fixtures in bathroom, shower, and each patient room: Yes. ; Findings: All clear  APPEARANCE/BEHAVIOR  calm, cooperative and adequate rapport can be established  NEURO ASSESSMENT  Orientation: time, place  and person  Hallucinations: No.None noted (Hallucinations)  Speech: Normal  Gait: normal  RESPIRATORY ASSESSMENT  WNL  CARDIOVASCULAR ASSESSMENT  WNL  GASTROINTESTINAL ASSESSMENT  WNL  EXTREMITIES  WNL  PLAN OF CARE  Provide calm/safe environment. Vital signs assessed TID. ED BHU Assessment once each 12-hour shift. Collaborate with TTS daily or as condition indicates. Assure the ED provider has rounded once each shift. Provide and encourage hygiene. Provide redirection as needed. Assess for escalating behavior; address immediately and inform ED provider.  Assess family dynamic and appropriateness for visitation as needed: Yes. ; If necessary, describe findings:  Educate the patient/family about BHU procedures/visitation: Yes. ; If necessary, describe findings: Pt is calm and cooperative at this time. Pt understanding and accepting of unit procedures/rules. Will continue to monitor with Q 15 min safety rounds and observation via security camera.     

## 2018-03-30 NOTE — ED Notes (Signed)
Pt. States she is SI, HI and hallucinations.

## 2018-03-30 NOTE — ED Notes (Signed)
Pt. States she has homicidal thoughts toward her roommates at group home.  Pt. Asked to explain why she was having those thoughts.  Pt. States "They think they are better than me".  This is making me feel sad, that is why I am suicidal.

## 2018-03-30 NOTE — ED Notes (Signed)
Pt dressed out by Trenton FoundsKadijah EDT  Pt belongings:   1 jacket  1 pair of leggings 1 pair of blue tennis shoes 1 white bra 1 pair of grey socks 1 white and gold colored watch  2 brackets one red colored and the other is black colored ( placed in urine specimen cup)  2 silvered color rings ( placed in urine specimen cup)    Pt  Stated that she didn't have a cell phone, wallet, or any money on her at this time of dress out.   Pt also has glasses that she is wearing.

## 2018-03-30 NOTE — ED Triage Notes (Addendum)
[facility].  Patient states she is suicidal, homicidal and halluinational

## 2018-03-30 NOTE — ED Provider Notes (Signed)
[facility] Emergency Department Provider Note  ___________________________________________   First MD Initiated Contact with Patient 03/30/18 2027     (approximate)  I have reviewed the triage vital signs and the nursing notes.   HISTORY  Chief Complaint Psychiatric Evaluation   HPI [name] is a 52 y.o. female with a history of borderline personality disorder as well as schizoaffective disorder who was presented emergency department with homicidal and suicidal ideation as well as command hallucinations telling her to kill herself.  Patient reports that she is a plan to stab herself in the heart as well as to kill someone at her group home.  She says that she is hearing voices telling her to do these things.   Past Medical History:  Diagnosis Date  . Anxiety   . Asthma   . Borderline personality disorder (HCC)   . Cerebral palsy (HCC)   . COPD (chronic obstructive pulmonary disease) (HCC)   . Depression   . GERD (gastroesophageal reflux disease)   . Mild cognitive impairment   . Schizoaffective disorder (HCC)   . Wound abscess     Patient Active Problem List   Diagnosis Date Noted  . Severe recurrent major depression without psychotic features (HCC) 08/10/2016  . Overdose by acetaminophen 08/08/2016  . Schizoaffective disorder, bipolar type (HCC)   . 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 GERD (gastroesophageal reflux disease) 09/24/2014  . COPD (chronic obstructive pulmonary disease) (HCC) 09/24/2014  . Borderline personality disorder (HCC) 09/24/2014  . Cerebral palsy (HCC) 08/31/2014    Past Surgical History:  Procedure Laterality Date  . CHOLECYSTECTOMY    . INCISION AND DRAINAGE ABSCESS Right 01/02/2015   Procedure: INCISION AND DRAINAGE ABSCESS;  Surgeon: Tiney Rouge III, MD;  Location: [facility] ORS;  Service: General;  Laterality: Right;  . TONSILLECTOMY      Prior to Admission medications   Medication Sig Start Date End  Date Taking? Authorizing Provider  alum & mag hydroxide-simeth (MAALOX/MYLANTA) 200-200-20 MG/5ML suspension Take 30 mLs by mouth every 8 (eight) hours as needed for indigestion or heartburn. 12/14/17   Clapacs, Jackquline Denmark, MD  ARIPiprazole (ABILIFY) 5 MG tablet Take 1 tablet (5 mg total) by mouth daily. 12/14/17   Clapacs, Jackquline Denmark, MD  aspirin EC 81 MG tablet Take 1 tablet (81 mg total) by mouth daily. 12/14/17   Clapacs, Jackquline Denmark, MD  cetirizine (ZYRTEC) 10 MG tablet Take 1 tablet (10 mg total) by mouth daily. 12/14/17   Clapacs, Jackquline Denmark, MD  clonazePAM (KLONOPIN) 0.5 MG tablet Take 1 tablet (0.5 mg total) by mouth 2 (two) times daily. 12/14/17   Clapacs, Jackquline Denmark, MD  divalproex (DEPAKOTE) 500 MG DR tablet Take 1 tablet (500 mg total) by mouth 2 (two) times daily. 12/14/17   Clapacs, Jackquline Denmark, MD  escitalopram (LEXAPRO) 20 MG tablet Take 1 tablet (20 mg total) by mouth daily. 12/14/17   Clapacs, Jackquline Denmark, MD  fenofibrate 160 MG tablet Take 1 tablet (160 mg total) by mouth daily. 12/14/17   Clapacs, Jackquline Denmark, MD  Fluticasone-Salmeterol (ADVAIR) 250-50 MCG/DOSE AEPB Inhale 1 puff into the lungs 2 (two) times daily. 12/14/17   Clapacs, Jackquline Denmark, MD  folic acid (FOLVITE) 1 MG tablet Take 1 tablet (1 mg total) by mouth daily. 12/14/17   Clapacs, Jackquline Denmark, MD  furosemide (LASIX) 20 MG tablet Take 1 tablet (20 mg total) by mouth daily. 12/14/17   Clapacs, Jackquline Denmark, MD  levothyroxine (SYNTHROID, LEVOTHROID) 50 MCG tablet  Take 1 tablet (50 mcg total) by mouth daily before breakfast. 12/14/17   Clapacs, Jackquline DenmarkJohn T, MD  liver oil-zinc oxide (DESITIN) 40 % ointment Apply 1 application topically as needed for irritation. 01/25/18   Vena AustriaStaebler, Andreas, MD  oxybutynin (DITROPAN-XL) 5 MG 24 hr tablet Take 1 tablet (5 mg total) by mouth daily. 12/14/17   Clapacs, Jackquline DenmarkJohn T, MD  pantoprazole (PROTONIX) 40 MG tablet Take 1 tablet (40 mg total) by mouth daily. 12/14/17   Clapacs, Jackquline DenmarkJohn T, MD  polyethylene glycol (MIRALAX / GLYCOLAX) packet Take 17 g by mouth  daily as needed. 12/14/17   Clapacs, Jackquline DenmarkJohn T, MD  potassium chloride (K-DUR) 10 MEQ tablet Take 1 tablet (10 mEq total) by mouth daily. 12/14/17   Clapacs, Jackquline DenmarkJohn T, MD  risperiDONE (RISPERDAL) 3 MG tablet Take 1 tablet (3 mg total) by mouth 2 (two) times daily. 12/14/17   Clapacs, Jackquline DenmarkJohn T, MD  sucralfate (CARAFATE) 1 g tablet Take 1 tablet (1 g total) by mouth 4 (four) times daily -  with meals and at bedtime. 12/14/17   Clapacs, Jackquline DenmarkJohn T, MD  vitamin B-12 (CYANOCOBALAMIN) 1000 MCG tablet Take 1 tablet (1,000 mcg total) by mouth daily. 12/14/17   Clapacs, Jackquline DenmarkJohn T, MD    Allergies Penicillins  Family History  Problem Relation Age of Onset  . Heart attack Father   . Diabetes Mother     Social History Social History   Tobacco Use  . Smoking status: Former Games developermoker  . Smokeless tobacco: Never Used  Substance Use Topics  . Alcohol use: No  . Drug use: No    Review of Systems  Constitutional: No fever/chills Eyes: No visual changes. ENT: No sore throat. Cardiovascular: Denies chest pain. Respiratory: Denies shortness of breath. Gastrointestinal: No abdominal pain.  No nausea, no vomiting.  No diarrhea.  No constipation. Genitourinary: Negative for dysuria. Musculoskeletal: Negative for back pain. Skin: Negative for rash. Neurological: Negative for headaches, focal weakness or numbness.   ____________________________________________   PHYSICAL EXAM:  VITAL SIGNS: ED Triage Vitals [03/30/18 1957]  Enc Vitals Group     BP 138/86     Pulse Rate 84     Resp 18     Temp 97.8 F (36.6 C)     Temp Source Oral     SpO2      Weight 192 lb 14.4 oz (87.5 kg)     Height 5\' 3"  (1.6 m)     Head Circumference      Peak Flow      Pain Score 0     Pain Loc      Pain Edu?      Excl. in GC?     Constitutional: Alert and oriented.  Patient with slightly pressured speech and tangential. Eyes: Conjunctivae are normal.  Head: Atraumatic. Nose: No congestion/rhinnorhea. Mouth/Throat: Mucous  membranes are moist.  Neck: No stridor.   Cardiovascular: Normal rate, regular rhythm. Grossly normal heart sounds.  Respiratory: Normal respiratory effort.  No retractions. Lungs CTAB. Gastrointestinal: Soft and nontender. No distention.  Musculoskeletal: No lower extremity tenderness nor edema.  No joint effusions. Neurologic:  Normal speech and language. No gross focal neurologic deficits are appreciated. Skin:  Skin is warm, dry and intact. No rash noted. Psychiatric: Odd affect.  ____________________________________________   LABS (all labs ordered are listed, but only abnormal results are displayed)  Labs Reviewed - No data to display ____________________________________________  EKG   ____________________________________________  RADIOLOGY   ____________________________________________   PROCEDURES  Procedure(s) performed:   Procedures  Critical Care performed:   ____________________________________________   INITIAL IMPRESSION / ASSESSMENT AND PLAN / ED COURSE  Pertinent labs & imaging results that were available during my care of the patient were reviewed by me and considered in my medical decision making (see chart for details).  DDX: Suicidal ideation, homicidal ideation, psychosis, command hallucinations As part of my medical decision making, I reviewed the following data within the electronic MEDICAL RECORD NUMBER Notes from prior ED visits  Patient to be placed under commitment and to be consulted by psychiatry. ____________________________________________   FINAL CLINICAL IMPRESSION(S) / ED DIAGNOSES  HI, SI, psychosis   NEW MEDICATIONS STARTED DURING THIS VISIT:  New Prescriptions   No medications on file     Note:  This document was prepared using Dragon voice recognition software and may include unintentional dictation errors.     Myrna Blazer, MD 03/30/18 2100

## 2018-03-31 NOTE — ED Notes (Signed)
BEHAVIORAL HEALTH ROUNDING Patient sleeping: Yes.   Patient alert and oriented: not applicable SLEEPING Behavior appropriate: Yes.  ; If no, describe: SLEEPING Nutrition and fluids offered: No SLEEPING Toileting and hygiene offered: NoSLEEPING Sitter present: not applicable, Q 15 min safety rounds and observation via security camera. Law enforcement present: Yes ODS 

## 2018-03-31 NOTE — BH Assessment (Signed)
Pt sleeping at this time. Will return when pt is available to complete assessment

## 2018-03-31 NOTE — ED Notes (Signed)
This RN called [facility] and spoke with Samson FredericElla, Caregiver to inform her patient is up for discharge. Samson Fredericlla states she has to speak with her supervisor prior to coming to get the patient. Requested return call to let me know what time she is coming.

## 2018-03-31 NOTE — ED Notes (Addendum)
[facility] to [facility]. Camera placed in room and pt awakened and  understanding of process.

## 2018-03-31 NOTE — ED Provider Notes (Signed)
-----------------------------------------   4:24 AM on 03/31/2018 -----------------------------------------  [facility] Dr. Orpah Clintonollin who deems her psychiatrically stable for discharge back to group home.  Does not meet criteria for IVC.  No new medication recommendations.     Irean HongSung, Rayaan Lorah J, MD 03/31/18 [phone]

## 2018-03-31 NOTE — Discharge Instructions (Addendum)
Continue all medicines as directed by your doctor.  Return to the ER for worsening symptoms, feelings of hurting yourself or others, or other concerns. °

## 2018-03-31 NOTE — ED Notes (Signed)
Return call received from Bass LakeElla, Caregiver at [facility] who states she will be here around 6 am to pick up the patient.

## 2018-04-03 ENCOUNTER — Emergency Department
Admission: EM | Admit: 2018-04-03 | Discharge: 2018-04-05 | Disposition: A | Discharge status: Home / Self Care | Payer: Medicare Other | Attending: Emergency Medicine | Admitting: Emergency Medicine

## 2018-04-03 ENCOUNTER — Encounter: Payer: Self-pay | Admitting: Emergency Medicine

## 2018-04-03 ENCOUNTER — Other Ambulatory Visit: Payer: Self-pay

## 2018-04-03 ENCOUNTER — Emergency Department: Payer: Medicare Other

## 2018-04-03 DIAGNOSIS — R45850 Homicidal ideations: Secondary | ICD-10-CM

## 2018-04-03 DIAGNOSIS — F259 Schizoaffective disorder, unspecified: Secondary | ICD-10-CM | POA: Diagnosis not present

## 2018-04-03 DIAGNOSIS — F603 Borderline personality disorder: Secondary | ICD-10-CM | POA: Diagnosis not present

## 2018-04-03 DIAGNOSIS — Z87891 Personal history of nicotine dependence: Secondary | ICD-10-CM | POA: Diagnosis not present

## 2018-04-03 DIAGNOSIS — J449 Chronic obstructive pulmonary disease, unspecified: Secondary | ICD-10-CM | POA: Diagnosis not present

## 2018-04-03 DIAGNOSIS — Z7982 Long term (current) use of aspirin: Secondary | ICD-10-CM | POA: Insufficient documentation

## 2018-04-03 DIAGNOSIS — R45851 Suicidal ideations: Secondary | ICD-10-CM | POA: Insufficient documentation

## 2018-04-03 DIAGNOSIS — Z79899 Other long term (current) drug therapy: Secondary | ICD-10-CM | POA: Insufficient documentation

## 2018-04-03 DIAGNOSIS — Z046 Encounter for general psychiatric examination, requested by authority: Secondary | ICD-10-CM | POA: Diagnosis present

## 2018-04-03 LAB — COMPREHENSIVE METABOLIC PANEL
ALBUMIN: 3.7 g/dL (ref 3.5–5.0)
ALT: 17 U/L (ref 0–44)
AST: 22 U/L (ref 15–41)
Alkaline Phosphatase: 34 U/L — ABNORMAL LOW (ref 38–126)
Anion gap: 8 (ref 5–15)
BILIRUBIN TOTAL: 0.3 mg/dL (ref 0.3–1.2)
BUN: 20 mg/dL (ref 6–20)
CALCIUM: 8.9 mg/dL (ref 8.9–10.3)
CO2: 24 mmol/L (ref 22–32)
Chloride: 106 mmol/L (ref 98–111)
Creatinine, Ser: 0.53 mg/dL (ref 0.44–1.00)
GFR calc Af Amer: >60 mL/min (ref >60)
GFR calc non Af Amer: >60 mL/min (ref >60)
GLUCOSE: 84 mg/dL (ref 70–99)
Potassium: 3.9 mmol/L (ref 3.5–5.1)
Sodium: 138 mmol/L (ref 135–145)
TOTAL PROTEIN: 6.9 g/dL (ref 6.5–8.1)

## 2018-04-03 LAB — CBC
HCT: 35.3 % — ABNORMAL LOW (ref 36.0–46.0)
Hemoglobin: 11.4 g/dL — ABNORMAL LOW (ref 12.0–15.0)
MCH: 32.4 pg (ref 26.0–34.0)
MCHC: 32.3 g/dL (ref 30.0–36.0)
MCV: 100.3 fL — ABNORMAL HIGH (ref 80.0–100.0)
Platelets: 288 10*3/uL (ref 150–400)
RBC: 3.52 MIL/uL — ABNORMAL LOW (ref 3.87–5.11)
RDW: 13.8 % (ref 11.5–15.5)
WBC: 7.1 10*3/uL (ref 4.0–10.5)
nRBC: 0 % (ref 0.0–0.2)

## 2018-04-03 LAB — SALICYLATE LEVEL: Salicylate Lvl: <7 mg/dL (ref 2.8–30.0)

## 2018-04-03 LAB — ACETAMINOPHEN LEVEL: Acetaminophen (Tylenol), Serum: <10 ug/mL — ABNORMAL LOW (ref 10–30)

## 2018-04-03 LAB — ETHANOL: Alcohol, Ethyl (B): <10 mg/dL (ref <10)

## 2018-04-03 NOTE — ED Notes (Signed)
Pt. Transferred to BHU from ED to room after screening for contraband. Report to include Situation, Background, Assessment and Recommendations from [facility]. Pt. Oriented to unit including Q15 minute rounds as well as the security cameras for their protection. Patient is alert and oriented, warm and dry in no acute distress. Patient reported SI and HI. She denied AVH. Pt. Encouraged to let me know if needs arise.

## 2018-04-03 NOTE — ED Provider Notes (Signed)
[facility] Emergency Department Provider Note   ____________________________________________   I have reviewed the triage vital signs and the nursing notes.   HISTORY  Chief Complaint Homicidal  History limited by: Not Limited   HPI [name] is a 52 y.o. female who presents to the emergency department today because she states she wants to kill the people in her group home.  She states that she was very upset that there is somebody staying at a group home that she does not like.  She states that the present her back she will kill everybody in her group home.  She asked not to be sent back to her group home tonight.  She only has medical complaint of left second toe pain.  She states that she did stub it today.   Per medical record review patient has a history of frequent emergency department visits.   Past Medical History:  Diagnosis Date  . Anxiety   . Asthma   . Borderline personality disorder ([place])   . Cerebral palsy ([place])   . COPD (chronic obstructive pulmonary disease) ([place])   . Depression   . GERD (gastroesophageal reflux disease)   . Mild cognitive impairment   . Schizoaffective disorder ([place])   . Wound abscess     Patient Active Problem List   Diagnosis Date Noted  . Severe recurrent major depression without psychotic features ([place]) 08/10/2016  . Overdose by acetaminophen 08/08/2016  . Schizoaffective disorder, bipolar type ([place])   . 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 GERD (gastroesophageal reflux disease) 09/24/2014  . COPD (chronic obstructive pulmonary disease) ([place]) 09/24/2014  . Borderline personality disorder ([place]) 09/24/2014  . Cerebral palsy ([place]) 08/31/2014    Past Surgical History:  Procedure Laterality Date  . CHOLECYSTECTOMY    . INCISION AND DRAINAGE ABSCESS Right 01/02/2015   Procedure: INCISION AND DRAINAGE ABSCESS;  Surgeon: Dia Crawford III, MD;  Location: [facility] ORS;  Service: General;  Laterality: Right;   . TONSILLECTOMY      Prior to Admission medications   Medication Sig Start Date End Date Taking? Authorizing Provider  alum & mag hydroxide-simeth (MAALOX/MYLANTA) 200-200-20 MG/5ML suspension Take 30 mLs by mouth every 8 (eight) hours as needed for indigestion or heartburn. 12/14/17   Clapacs, Madie Reno, MD  ARIPiprazole (ABILIFY) 5 MG tablet Take 1 tablet (5 mg total) by mouth daily. 12/14/17   Clapacs, Madie Reno, MD  aspirin EC 81 MG tablet Take 1 tablet (81 mg total) by mouth daily. 12/14/17   Clapacs, Madie Reno, MD  cetirizine (ZYRTEC) 10 MG tablet Take 1 tablet (10 mg total) by mouth daily. 12/14/17   Clapacs, Madie Reno, MD  clonazePAM (KLONOPIN) 0.5 MG tablet Take 1 tablet (0.5 mg total) by mouth 2 (two) times daily. 12/14/17   Clapacs, Madie Reno, MD  divalproex (DEPAKOTE) 500 MG DR tablet Take 1 tablet (500 mg total) by mouth 2 (two) times daily. 12/14/17   Clapacs, Madie Reno, MD  escitalopram (LEXAPRO) 20 MG tablet Take 1 tablet (20 mg total) by mouth daily. 12/14/17   Clapacs, Madie Reno, MD  fenofibrate 160 MG tablet Take 1 tablet (160 mg total) by mouth daily. 12/14/17   Clapacs, Madie Reno, MD  Fluticasone-Salmeterol (ADVAIR) 250-50 MCG/DOSE AEPB Inhale 1 puff into the lungs 2 (two) times daily. 12/14/17   Clapacs, Madie Reno, MD  folic acid (FOLVITE) 1 MG tablet Take 1 tablet (1 mg total) by mouth daily. 12/14/17   Clapacs, Madie Reno, MD  furosemide (  LASIX) 20 MG tablet Take 1 tablet (20 mg total) by mouth daily. 12/14/17   Clapacs, Madie Reno, MD  levothyroxine (SYNTHROID, LEVOTHROID) 50 MCG tablet Take 1 tablet (50 mcg total) by mouth daily before breakfast. 12/14/17   Clapacs, Madie Reno, MD  liver oil-zinc oxide (DESITIN) 40 % ointment Apply 1 application topically as needed for irritation. 01/25/18   Malachy Mood, MD  oxybutynin (DITROPAN-XL) 5 MG 24 hr tablet Take 1 tablet (5 mg total) by mouth daily. 12/14/17   Clapacs, Madie Reno, MD  pantoprazole (PROTONIX) 40 MG tablet Take 1 tablet (40 mg total) by mouth daily. 12/14/17    Clapacs, Madie Reno, MD  polyethylene glycol (MIRALAX / GLYCOLAX) packet Take 17 g by mouth daily as needed. 12/14/17   Clapacs, Madie Reno, MD  potassium chloride (K-DUR) 10 MEQ tablet Take 1 tablet (10 mEq total) by mouth daily. 12/14/17   Clapacs, Madie Reno, MD  risperiDONE (RISPERDAL) 3 MG tablet Take 1 tablet (3 mg total) by mouth 2 (two) times daily. 12/14/17   Clapacs, Madie Reno, MD  sucralfate (CARAFATE) 1 g tablet Take 1 tablet (1 g total) by mouth 4 (four) times daily -  with meals and at bedtime. 12/14/17   Clapacs, Madie Reno, MD  vitamin B-12 (CYANOCOBALAMIN) 1000 MCG tablet Take 1 tablet (1,000 mcg total) by mouth daily. 12/14/17   Clapacs, Madie Reno, MD    Allergies Penicillins  Family History  Problem Relation Age of Onset  . Heart attack Father   . Diabetes Mother     Social History Social History   Tobacco Use  . Smoking status: Former Research scientist (life sciences)  . Smokeless tobacco: Never Used  Substance Use Topics  . Alcohol use: No  . Drug use: No    Review of Systems Constitutional: No fever/chills Eyes: No visual changes. ENT: No sore throat. Cardiovascular: Denies chest pain. Respiratory: Denies shortness of breath. Gastrointestinal: No abdominal pain.  No nausea, no vomiting.  No diarrhea.   Genitourinary: Negative for dysuria. Musculoskeletal: Positive for left 2nd toe pain. Skin: Negative for rash. Neurological: Negative for headaches, focal weakness or numbness.  ____________________________________________   PHYSICAL EXAM:  VITAL SIGNS: ED Triage Vitals  Enc Vitals Group     BP 04/03/18 1841 (!) 143/90     Pulse Rate 04/03/18 1841 73     Resp 04/03/18 1841 20     Temp 04/03/18 1841 97.8 F (36.6 C)     Temp Source 04/03/18 1841 Oral     SpO2 04/03/18 1841 96 %     Weight 04/03/18 1842 192 lb 14.4 oz (87.5 kg)     Height 04/03/18 1842 '5\' 3"'  (1.6 m)     Head Circumference --      Peak Flow --      Pain Score 04/03/18 1842 0   Constitutional: Alert and oriented.  Eyes:  Conjunctivae are normal.  ENT      Head: Normocephalic and atraumatic.      Nose: No congestion/rhinnorhea.      Mouth/Throat: Mucous membranes are moist.      Neck: No stridor. Hematological/Lymphatic/Immunilogical: No cervical lymphadenopathy. Cardiovascular: Normal rate, regular rhythm.  No murmurs, rubs, or gallops.  Respiratory: Normal respiratory effort without tachypnea nor retractions. Breath sounds are clear and equal bilaterally. No wheezes/rales/rhonchi. Gastrointestinal: Soft and non tender. No rebound. No guarding.  Genitourinary: Deferred Musculoskeletal: Normal range of motion in all extremities. Small amount of swelling around 2nd left toe. Mildly tender. No warmth. No skin  break.  Neurologic:  Normal speech and language. No gross focal neurologic deficits are appreciated.  Skin:  Skin is warm, dry and intact. No rash noted. Psychiatric: Patient calm.   ____________________________________________    LABS (pertinent positives/negatives)  CBC wbc 7.1, hgb 11.4, plt 288 CMP wnl except alk phos 34 Acetaminophen, salicylate, ethanol below threshold ____________________________________________   EKG  None  ____________________________________________    RADIOLOGY  Left 2nd toe No fracture, some swelling  ____________________________________________   PROCEDURES  Procedures  ____________________________________________   INITIAL IMPRESSION / ASSESSMENT AND PLAN / ED COURSE  Pertinent labs & imaging results that were available during my care of the patient were reviewed by me and considered in my medical decision making (see chart for details).   Patient presented to the emergency department today under IVC because of concerns for homicidal ideation.  On my exam she appears to be at her baseline.  She is well-known both to the emergency department and myself.  She was however complaining of some left second toe pain.  She states this happened after she  stubbed it.  X-ray without any fractures. Will have SOC evaluate.     ____________________________________________   FINAL CLINICAL IMPRESSION(S) / ED DIAGNOSES  Left second toe pain  Note: This dictation was prepared with Dragon dictation. Any transcriptional errors that result from this process are unintentional     Nance Pear, MD 04/04/18 1539

## 2018-04-03 NOTE — ED Notes (Signed)
Pt. States "I want to kill everyone in group home".  Pt. Asked "Why would you want to do that?", Pt. States "Because they are mean to me".  Pt. Asked, "what do they say, pt. States her group home manager told me this morning that I was the worse resident she has every had".  Pt. States "This made me feel bad".

## 2018-04-03 NOTE — ED Notes (Signed)
Hourly rounding reveals patient in room. No complaints, stable, in no acute distress. Q15 minute rounds and monitoring via Security Cameras to continue. 

## 2018-04-03 NOTE — ED Notes (Signed)
Snack and beverage given. 

## 2018-04-03 NOTE — ED Triage Notes (Signed)
[place] [facility] [place]. She states that she is Homicidal and suicidal, her plan to stab herself and strangle everyone in her group home. She has had this plan since Monday.

## 2018-04-04 MED ORDER — ACETAMINOPHEN 325 MG PO TABS
650.0000 mg | ORAL_TABLET | Freq: Once | ORAL | Status: AC
  Administered 2018-04-04: 650 mg via ORAL
  Filled 2018-04-04: qty 2

## 2018-04-04 NOTE — ED Notes (Signed)
Patient said to writer "I thought I was leaving to go to the group home", informed patient that the group home is still making a decision upon whether or not she can come back because of the statement she made (thereatening to kill the group home staff). Patient said she said that because the staff made her mad. Patient denies SI/HI/AVH at this time. Patient asked where she was going, discussed with patient as soon as it is confirmed we will let her know. Patient asked will I get to leave before Christmas, let her know there is a good possibility she will leave by the end of week Patient said ok

## 2018-04-04 NOTE — ED Notes (Signed)
Called [facility], and spoke with Jasmine DecemberSharon (group home coordinator) to inform her that [facility], Ms. Jasmine DecemberSharon stated that patient is not able to come back to the group home due to threatening to kill staff. Ms. Jasmine DecemberSharon stated they are in the process of discharging her from the group home.  Ms. Jasmine DecemberSharon stated that patients care coordinator Larena Soxshley Saunders is aware [phone]).  Patients community support team is IranShauna Little [phone]

## 2018-04-04 NOTE — ED Notes (Signed)
Hourly rounding reveals patient in room. No complaints, stable, in no acute distress. Q15 minute rounds and monitoring via Security Cameras to continue. 

## 2018-04-04 NOTE — ED Notes (Signed)
Report to include Situation, Background, Assessment, and Recommendations received from [facility]. Patient alert and oriented, warm and dry, in no acute distress. Patient denies SI, HI, VH and pain. Patient states she has AH without command. Patient made aware of Q15 minute rounds and security cameras for their safety. Patient instructed to come to me with needs or concerns.

## 2018-04-04 NOTE — ED Notes (Addendum)
Pt c/o headache pain level 10/10. EDP notified.

## 2018-04-04 NOTE — ED Notes (Signed)
Hourly rounding reveals patient sleeping in room. No complaints, stable, in no acute distress. Q15 minute rounds and monitoring via Security Cameras to continue. 

## 2018-04-04 NOTE — ED Notes (Addendum)
Patient spoke with Ms. Clarene DukeLittle community support person and she informed patient that she will be going with brother tomorrow. Patient became anxious because she feels like she and brother may not do well together. Care coordinator and this Clinical research associatewriter discussed with patient that she has to go and show brother she can be a better person. Let patient know that she cant say she wants to kill people and then say sorry, she has to really mean it and also begin working on her and family's relationship.

## 2018-04-04 NOTE — Progress Notes (Signed)
Patient will be picked up by her brother  Mellody DanceKeith by noon tomorrow [phone] ( He is coming in from [place].   Spoke with Leeanne Mannanarolyn Carter- Director of [phone] She reported she has to follow up with cardinal and has sent the 24 emergency discharge notification this morning to her [facility] and is endorsed by DSS- patient had a direct plan to harm her staff and resident. LCSW advocated and expressed that its her MO to make verbal threats of harm to self, staff and others-and in 3 years of following her she has never acted on this. They will terminate her housing contract as planned.    Spoke to Federal-MogulFelencio Thompson- PSI act team supervisor and he reported the patients brother will come and get patient by noon.   Delta Air LinesClaudine Eshan Trupiano LCSW [phone]

## 2018-04-04 NOTE — ED Notes (Signed)
Patient urinated in her pants. Scrub bottoms and panties as well as socks given.

## 2018-04-04 NOTE — ED Notes (Signed)
Attempted to call [facility] [phone] and left a HIPAA compliant message

## 2018-04-04 NOTE — ED Notes (Signed)
Patient urinated in her pants. Scrub bottoms and panties as well as socks given. 

## 2018-04-04 NOTE — Progress Notes (Addendum)
LCSW called PTS- CST team manager  Spoke to Swan LakeShawna- She is going to try and call patients mother to see if she can stay with her until new placement. It was explained IDD patient requires 60 day written  Sharilyn SitesCalled Sharon of  [facility] [phone] Left detailed message awaiting call back.  Delta Air LinesClaudine Oyinkansola Truax LCSW [phone]

## 2018-04-04 NOTE — ED Provider Notes (Signed)
-----------------------------------------   7:19 AM on 04/04/2018 -----------------------------------------   Blood pressure (!) 143/90, pulse 73, temperature 97.8 F (36.6 C), temperature source Oral, resp. rate 20, height 5\' 3"  (1.6 m), weight 87.5 kg, SpO2 96 %.  The patient had no acute events since last update.  Calm and cooperative at this time.  Patient evaluated by Dr. Hermelinda MedicusAlvarado of tele-psych.  Dr. Hermelinda MedicusAlvarado recommends discontinuing the involuntary commitment and discharged home.  Patient without active suicidal or homicidal ideation at this time.  Not actively psychotic, currently.  Will be discharged at this time.    Myrna BlazerSchaevitz, Bascom Biel Matthew, MD 04/04/18 [phone]

## 2018-04-04 NOTE — ED Notes (Signed)
Hourly rounding reveals patient in room. No complaints, stable, in no acute distress. Q15 minute rounds and monitoring via Security Cameras to continue.Snack and beverage given. 

## 2018-04-05 MED ORDER — NITROFURANTOIN MONOHYD MACRO 100 MG PO CAPS: 100.0000 mg | ORAL_CAPSULE | Freq: Two times a day (BID) | ORAL | 0 refills | Status: AC

## 2018-04-05 NOTE — ED Notes (Signed)
Pt. Urinated in pants. Scrubs and diaper given. Bed changed.

## 2018-04-05 NOTE — ED Notes (Signed)
Patient left with brother, left calmly.

## 2018-04-05 NOTE — ED Notes (Signed)
Patient up and had wet the bed, nurse gave her change of clothes and changed the linen, Patient is cooperative, no signs of distress at this time.

## 2018-04-05 NOTE — ED Notes (Signed)
Hourly rounding reveals patient sleeping in room. No complaints, stable, in no acute distress. Q15 minute rounds and monitoring via Security Cameras to continue. 

## 2018-04-05 NOTE — ED Notes (Signed)
Patient with discharge instructions, prescription given for signs of UTI, Patient and her [place] whom [place] voice understanding of discharge instructions. Patient is calm and cooperative.

## 2018-04-05 NOTE — ED Notes (Signed)
Hourly rounding reveals patient in room. No complaints, stable, in no acute distress. Q15 minute rounds and monitoring via Security Cameras to continue. 

## 2018-05-11 ENCOUNTER — Emergency Department (HOSPITAL_COMMUNITY)
Admission: EM | Admit: 2018-05-11 | Discharge: 2018-05-11 | Disposition: A | Discharge status: Home / Self Care | Payer: Medicare Other | Attending: Emergency Medicine | Admitting: Emergency Medicine

## 2018-05-11 ENCOUNTER — Encounter (HOSPITAL_COMMUNITY): Payer: Self-pay | Admitting: Emergency Medicine

## 2018-05-11 DIAGNOSIS — R45851 Suicidal ideations: Secondary | ICD-10-CM | POA: Diagnosis not present

## 2018-05-11 DIAGNOSIS — F250 Schizoaffective disorder, bipolar type: Secondary | ICD-10-CM | POA: Insufficient documentation

## 2018-05-11 DIAGNOSIS — F419 Anxiety disorder, unspecified: Secondary | ICD-10-CM | POA: Insufficient documentation

## 2018-05-11 DIAGNOSIS — Z79899 Other long term (current) drug therapy: Secondary | ICD-10-CM | POA: Insufficient documentation

## 2018-05-11 DIAGNOSIS — Z760 Encounter for issue of repeat prescription: Secondary | ICD-10-CM | POA: Diagnosis not present

## 2018-05-11 DIAGNOSIS — G809 Cerebral palsy, unspecified: Secondary | ICD-10-CM | POA: Insufficient documentation

## 2018-05-11 DIAGNOSIS — J45909 Unspecified asthma, uncomplicated: Secondary | ICD-10-CM | POA: Diagnosis not present

## 2018-05-11 DIAGNOSIS — F70 Mild intellectual disabilities: Secondary | ICD-10-CM | POA: Diagnosis not present

## 2018-05-11 DIAGNOSIS — Z7982 Long term (current) use of aspirin: Secondary | ICD-10-CM | POA: Insufficient documentation

## 2018-05-11 DIAGNOSIS — Z87891 Personal history of nicotine dependence: Secondary | ICD-10-CM | POA: Diagnosis not present

## 2018-05-11 DIAGNOSIS — J449 Chronic obstructive pulmonary disease, unspecified: Secondary | ICD-10-CM | POA: Insufficient documentation

## 2018-05-11 MED ORDER — HYDROXYZINE HCL 25 MG PO TABS: 25.0000 mg | ORAL_TABLET | Freq: Four times a day (QID) | ORAL | 0 refills | Status: DC | PRN

## 2018-05-11 MED ORDER — HYDROXYZINE HCL 25 MG PO TABS
25.0000 mg | ORAL_TABLET | Freq: Once | ORAL | Status: AC
  Administered 2018-05-11: 25 mg via ORAL
  Filled 2018-05-11: qty 1

## 2018-05-11 NOTE — ED Provider Notes (Signed)
[facility]   CSN: [id number] Arrival date & time: 05/11/18  1531     History   Chief Complaint Chief Complaint  Patient presents with  . Anxiety    HPI [name] is a 53 y.o. female.  The history is provided by the patient and medical records. No language interpreter was used.  Anxiety      53 year old female with history of borderline personality disorder, anxiety, asthma, cerebral palsy and schizoaffective disorder presenting with complaints of anxiety.  Patient states he does not feel good.  States that she does not feel well for the past 2 to 3 months.  States she has suicidal ideation but unable to give me any specific plan.  She also mentioned that she does not have her normal medication and would like refills.  States she ran out of it this week but because it is the weekend she does not have the opportunity to follow-up with her PCP.  She mention not having her asthma medication and states it causes her to be more anxious.  She denies any homicidal ideation auditory or visual hallucination.  Patient also request to eat.  Past Medical History:  Diagnosis Date  . Anxiety   . Asthma   . Borderline personality disorder (HCC)   . Cerebral palsy (HCC)   . COPD (chronic obstructive pulmonary disease) (HCC)   . Depression   . GERD (gastroesophageal reflux disease)   . Mild cognitive impairment   . Schizoaffective disorder (HCC)   . Wound abscess     Patient Active Problem List   Diagnosis Date Noted  . Severe recurrent major depression without psychotic features (HCC) 08/10/2016  . Overdose by acetaminophen 08/08/2016  . Schizoaffective disorder, bipolar type (HCC)   . 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 GERD (gastroesophageal reflux disease) 09/24/2014  . COPD (chronic obstructive pulmonary disease) (HCC) 09/24/2014  . Borderline personality disorder (HCC) 09/24/2014  . Cerebral palsy (HCC) 08/31/2014     Past Surgical History:  Procedure Laterality Date  . CHOLECYSTECTOMY    . INCISION AND DRAINAGE ABSCESS Right 01/02/2015   Procedure: INCISION AND DRAINAGE ABSCESS;  Surgeon: Tiney Rougealph Ely III, MD;  Location: [facility] ORS;  Service: General;  Laterality: Right;  . TONSILLECTOMY       OB History    Gravida  0   Para  0   Term  0   Preterm  0   AB  0   Living  0     SAB  0   TAB  0   Ectopic  0   Multiple  0   Live Births               Home Medications    Prior to Admission medications   Medication Sig Start Date End Date Taking? Authorizing Provider  alum & mag hydroxide-simeth (MAALOX/MYLANTA) 200-200-20 MG/5ML suspension Take 30 mLs by mouth every 8 (eight) hours as needed for indigestion or heartburn. 12/14/17   Clapacs, Jackquline DenmarkJohn T, MD  ARIPiprazole (ABILIFY) 5 MG tablet Take 1 tablet (5 mg total) by mouth daily. 12/14/17   Clapacs, Jackquline DenmarkJohn T, MD  aspirin EC 81 MG tablet Take 1 tablet (81 mg total) by mouth daily. 12/14/17   Clapacs, Jackquline DenmarkJohn T, MD  cetirizine (ZYRTEC) 10 MG tablet Take 1 tablet (10 mg total) by mouth daily. 12/14/17   Clapacs, Jackquline DenmarkJohn T, MD  clonazePAM (KLONOPIN) 0.5 MG tablet Take 1 tablet (0.5 mg total) by mouth 2 (  two) times daily. 12/14/17   Clapacs, Jackquline Denmark, MD  divalproex (DEPAKOTE) 500 MG DR tablet Take 1 tablet (500 mg total) by mouth 2 (two) times daily. 12/14/17   Clapacs, Jackquline Denmark, MD  escitalopram (LEXAPRO) 20 MG tablet Take 1 tablet (20 mg total) by mouth daily. 12/14/17   Clapacs, Jackquline Denmark, MD  fenofibrate 160 MG tablet Take 1 tablet (160 mg total) by mouth daily. 12/14/17   Clapacs, Jackquline Denmark, MD  Fluticasone-Salmeterol (ADVAIR) 250-50 MCG/DOSE AEPB Inhale 1 puff into the lungs 2 (two) times daily. 12/14/17   Clapacs, Jackquline Denmark, MD  folic acid (FOLVITE) 1 MG tablet Take 1 tablet (1 mg total) by mouth daily. 12/14/17   Clapacs, Jackquline Denmark, MD  furosemide (LASIX) 20 MG tablet Take 1 tablet (20 mg total) by mouth daily. 12/14/17   Clapacs, Jackquline Denmark, MD  levothyroxine  (SYNTHROID, LEVOTHROID) 50 MCG tablet Take 1 tablet (50 mcg total) by mouth daily before breakfast. 12/14/17   Clapacs, Jackquline Denmark, MD  liver oil-zinc oxide (DESITIN) 40 % ointment Apply 1 application topically as needed for irritation. 01/25/18   Vena Austria, MD  oxybutynin (DITROPAN-XL) 5 MG 24 hr tablet Take 1 tablet (5 mg total) by mouth daily. 12/14/17   Clapacs, Jackquline Denmark, MD  pantoprazole (PROTONIX) 40 MG tablet Take 1 tablet (40 mg total) by mouth daily. 12/14/17   Clapacs, Jackquline Denmark, MD  polyethylene glycol (MIRALAX / GLYCOLAX) packet Take 17 g by mouth daily as needed. 12/14/17   Clapacs, Jackquline Denmark, MD  potassium chloride (K-DUR) 10 MEQ tablet Take 1 tablet (10 mEq total) by mouth daily. 12/14/17   Clapacs, Jackquline Denmark, MD  risperiDONE (RISPERDAL) 3 MG tablet Take 1 tablet (3 mg total) by mouth 2 (two) times daily. 12/14/17   Clapacs, Jackquline Denmark, MD  sucralfate (CARAFATE) 1 g tablet Take 1 tablet (1 g total) by mouth 4 (four) times daily -  with meals and at bedtime. 12/14/17   Clapacs, Jackquline Denmark, MD  vitamin B-12 (CYANOCOBALAMIN) 1000 MCG tablet Take 1 tablet (1,000 mcg total) by mouth daily. 12/14/17   Clapacs, Jackquline Denmark, MD    Family History Family History  Problem Relation Age of Onset  . Heart attack Father   . Diabetes Mother     Social History Social History   Tobacco Use  . Smoking status: Former Games developer  . Smokeless tobacco: Never Used  Substance Use Topics  . Alcohol use: No  . Drug use: No     Allergies   Penicillins   Review of Systems Review of Systems  All other systems reviewed and are negative.    Physical Exam Updated Vital Signs BP 107/69 (BP Location: Left Arm)   Pulse 77   Temp 98.2 F (36.8 C) (Oral)   Resp 18   SpO2 95%   Physical Exam Vitals signs and nursing note reviewed.  Constitutional:      General: She is not in acute distress.    Appearance: She is well-developed.     Comments: Patient is sitting in the chair in no acute discomfort.  HENT:     Head:  Atraumatic.  Eyes:     Conjunctiva/sclera: Conjunctivae normal.  Neck:     Musculoskeletal: Neck supple.  Cardiovascular:     Rate and Rhythm: Normal rate and regular rhythm.     Pulses: Normal pulses.     Heart sounds: Normal heart sounds.  Pulmonary:     Effort: Pulmonary effort is normal.  Breath sounds: Normal breath sounds. No wheezing or rales.  Abdominal:     Palpations: Abdomen is soft.     Tenderness: There is no abdominal tenderness.  Skin:    Findings: No rash.  Neurological:     Mental Status: She is alert and oriented to person, place, and time.     GCS: GCS eye subscore is 4. GCS verbal subscore is 5. GCS motor subscore is 6.  Psychiatric:        Mood and Affect: Affect is flat.        Speech: Speech is delayed.        Behavior: Behavior is slowed.        Thought Content: Thought content is not paranoid. Thought content does not include homicidal or suicidal ideation.      ED Treatments / Results  Labs (all labs ordered are listed, but only abnormal results are displayed) Labs Reviewed - No data to display  EKG None  Radiology No results found.  Procedures Procedures (including critical care time)  Medications Ordered in ED Medications  hydrOXYzine (ATARAX/VISTARIL) tablet 25 mg (has no administration in time range)     Initial Impression / Assessment and Plan / ED Course  I have reviewed the triage vital signs and the nursing notes.  Pertinent labs & imaging results that were available during my care of the patient were reviewed by me and considered in my medical decision making (see chart for details).     BP 107/69 (BP Location: Left Arm)   Pulse 77   Temp 98.2 F (36.8 C) (Oral)   Resp 18   SpO2 95%    Final Clinical Impressions(s) / ED Diagnoses   Final diagnoses:  Anxiety    ED Discharge Orders         Ordered    hydrOXYzine (ATARAX/VISTARIL) 25 MG tablet  Every 6 hours PRN     05/11/18 1756         5:51 PM Patient  here and states that she is anxious because she ran out of her medication.  She has vague suicidal ideation but this is chronic.  She also complaining of vague chest discomfort which is also chronic for the past several months.  At this time no acute emergent medical condition identified.  Will provide Vistaril for anxiety and encourage patient to follow-up with her doctor in 2 days for medication refilled.  She is stable for discharge.   Fayrene Helper, PA-C 05/11/18 1757    Benjiman Core, MD 05/12/18 [phone]

## 2018-05-11 NOTE — ED Notes (Addendum)
Urine sample at bedside if needed  

## 2018-05-11 NOTE — Discharge Instructions (Addendum)
Call your doctor and follow up next week for medication refill.

## 2018-05-11 NOTE — ED Triage Notes (Signed)
Patient here via EMS with complaints of anxiety. Reports sudden onset today. Take Klonopin with no relief.

## 2018-05-11 NOTE — ED Notes (Signed)
Pt stated she needs to leave in 30 minutes bc her family is coming to pick her up. States all she needed was medication. Treatment team made aware.

## 2018-05-11 NOTE — ED Notes (Signed)
Bed: [facility] Expected date:  Expected time:  Means of arrival:  Comments: 

## 2018-05-17 ENCOUNTER — Other Ambulatory Visit: Payer: Self-pay

## 2018-05-17 ENCOUNTER — Emergency Department (HOSPITAL_COMMUNITY)
Admission: EM | Admit: 2018-05-17 | Discharge: 2018-05-18 | Disposition: A | Discharge status: Home / Self Care | Payer: Medicare Other | Attending: Emergency Medicine | Admitting: Emergency Medicine

## 2018-05-17 ENCOUNTER — Encounter (HOSPITAL_COMMUNITY): Payer: Self-pay | Admitting: Emergency Medicine

## 2018-05-17 DIAGNOSIS — J449 Chronic obstructive pulmonary disease, unspecified: Secondary | ICD-10-CM | POA: Diagnosis not present

## 2018-05-17 DIAGNOSIS — Z7982 Long term (current) use of aspirin: Secondary | ICD-10-CM | POA: Insufficient documentation

## 2018-05-17 DIAGNOSIS — G809 Cerebral palsy, unspecified: Secondary | ICD-10-CM | POA: Diagnosis not present

## 2018-05-17 DIAGNOSIS — F333 Major depressive disorder, recurrent, severe with psychotic symptoms: Secondary | ICD-10-CM | POA: Insufficient documentation

## 2018-05-17 DIAGNOSIS — Z87891 Personal history of nicotine dependence: Secondary | ICD-10-CM | POA: Diagnosis not present

## 2018-05-17 DIAGNOSIS — R45851 Suicidal ideations: Secondary | ICD-10-CM | POA: Insufficient documentation

## 2018-05-17 DIAGNOSIS — Z79899 Other long term (current) drug therapy: Secondary | ICD-10-CM | POA: Insufficient documentation

## 2018-05-17 LAB — COMPREHENSIVE METABOLIC PANEL
ALT: 18 U/L (ref 0–44)
AST: 27 U/L (ref 15–41)
Albumin: 3.5 g/dL (ref 3.5–5.0)
Alkaline Phosphatase: 38 U/L (ref 38–126)
Anion gap: 10 (ref 5–15)
BUN: 17 mg/dL (ref 6–20)
CO2: 28 mmol/L (ref 22–32)
CREATININE: 0.7 mg/dL (ref 0.44–1.00)
Calcium: 8.5 mg/dL — ABNORMAL LOW (ref 8.9–10.3)
Chloride: 106 mmol/L (ref 98–111)
GFR calc Af Amer: >60 mL/min (ref >60)
GFR calc non Af Amer: >60 mL/min (ref >60)
Glucose, Bld: 89 mg/dL (ref 70–99)
Potassium: 4 mmol/L (ref 3.5–5.1)
Sodium: 144 mmol/L (ref 135–145)
Total Bilirubin: 0.4 mg/dL (ref 0.3–1.2)
Total Protein: 6.7 g/dL (ref 6.5–8.1)

## 2018-05-17 LAB — CBC
HCT: 40.4 % (ref 36.0–46.0)
Hemoglobin: 13.3 g/dL (ref 12.0–15.0)
MCH: 32.1 pg (ref 26.0–34.0)
MCHC: 32.9 g/dL (ref 30.0–36.0)
MCV: 97.6 fL (ref 80.0–100.0)
Platelets: 263 10*3/uL (ref 150–400)
RBC: 4.14 MIL/uL (ref 3.87–5.11)
RDW: 12.7 % (ref 11.5–15.5)
WBC: 8.1 10*3/uL (ref 4.0–10.5)
nRBC: 0 % (ref 0.0–0.2)

## 2018-05-17 LAB — RAPID URINE DRUG SCREEN, HOSP PERFORMED
Amphetamines: NOT DETECTED
Barbiturates: NOT DETECTED
Benzodiazepines: NOT DETECTED
Cocaine: NOT DETECTED
Opiates: NOT DETECTED
Tetrahydrocannabinol: NOT DETECTED

## 2018-05-17 LAB — SALICYLATE LEVEL

## 2018-05-17 LAB — ACETAMINOPHEN LEVEL: Acetaminophen (Tylenol), Serum: <10 ug/mL — ABNORMAL LOW (ref 10–30)

## 2018-05-17 LAB — ETHANOL: Alcohol, Ethyl (B): <10 mg/dL (ref <10)

## 2018-05-17 LAB — I-STAT BETA HCG BLOOD, ED (MC, WL, AP ONLY): I-stat hCG, quantitative: 6 m[IU]/mL — ABNORMAL HIGH (ref <5)

## 2018-05-17 NOTE — ED Notes (Signed)
ED Provider at bedside. 

## 2018-05-17 NOTE — ED Notes (Signed)
Pt unable to void at this time. 

## 2018-05-17 NOTE — ED Notes (Signed)
TTS in progress 

## 2018-05-17 NOTE — ED Triage Notes (Signed)
Pt BIB [facility] voluntarily, reports SI, plan to OD on pills. States she was supposed to move to a group home but the date has been changed.   Call nephew Cariann Weitman [phone] for questions/or to pick pt up if discharged. CST worker Flo [phone]

## 2018-05-17 NOTE — BH Assessment (Addendum)
Tele Assessment Note   Patient Name: Grier Winstead MRN: [id number] Referring Physician: Delana Meyer Location of Patient: [facility] ED [facility]  Zella Aboulhosn is an 53 y.o. female.  The pt was brought in by the police due to SI.  The pt stated she wants to die by overdosing on medication.  She stated she is upset about the weather being cold and raining.  Her CST worker stated the pt may be upset about having the date she is supposed to go to a group home changed.  She was originally supposed to go to a group home Monday, but not sure when she will be going at this time.  According to the CST worker, the pt hasn't made any previous suicide attempts.  The pt stated she has been inpatient in the past, but can't remember when she was last inpatient.  She has had 7 visits to the ED since November.  She is with [facility] for CST and medication management.  Previous records indicate the pt is IDD.  The pt lives with her brother, mother, sister in law and nephew.  She stated everyone yells at her at home.  The pt stated she scratches herself.  She stated she wants to kill Nicky Pugh by stabbing him.  She stated Nicky Pugh is someone she used to live with.  The pt denies legal issues and abuse.  When asked about hallucinations, she started waving her hands around and said she sees flies.  She stopped waving her hands, when the next question was asked.  The pt stated she is sleeping well and has a poor appetite.  The pt reports feeling hopeless, feeling bad about herself and having crying spells. She denies SA. The pt is her own guardian.  Pt is dressed in scrubs. She is alert and oriented x4. Pt speaks in a clear tone, at moderate volume and normal pace. Eye contact is good. Pt's mood is depressed. Thought process is coherent and relevant. There is no indication Pt is currently responding to internal stimuli or experiencing delusional thought content.?Pt was cooperative  throughout assessment.    Diagnosis: F33.3 Major depressive disorder, Recurrent episode, With psychotic features   Past Medical History:  Past Medical History:  Diagnosis Date  . Anxiety   . Asthma   . Borderline personality disorder (HCC)   . Cerebral palsy (HCC)   . COPD (chronic obstructive pulmonary disease) (HCC)   . Depression   . GERD (gastroesophageal reflux disease)   . Mild cognitive impairment   . Schizoaffective disorder (HCC)   . Wound abscess     Past Surgical History:  Procedure Laterality Date  . CHOLECYSTECTOMY    . INCISION AND DRAINAGE ABSCESS Right 01/02/2015   Procedure: INCISION AND DRAINAGE ABSCESS;  Surgeon: Tiney Rouge III, MD;  Location: [facility] ORS;  Service: General;  Laterality: Right;  . TONSILLECTOMY      Family History:  Family History  Problem Relation Age of Onset  . Heart attack Father   . Diabetes Mother     Social History:  reports that she has quit smoking. She has never used smokeless tobacco. She reports that she does not drink alcohol or use drugs.  Additional Social History:  Alcohol / Drug Use Pain Medications: See MAR Prescriptions: See MAR Over the Counter: See MAR History of alcohol / drug use?: No history of alcohol / drug abuse Longest period of sobriety (when/how long): NA  CIWA: CIWA-Ar BP: 120/80 Pulse Rate: 70  COWS:    Allergies:  Allergies  Allergen Reactions  . Penicillins Nausea And Vomiting and Other (See Comments)    Has patient had a PCN reaction causing immediate rash, facial/tongue/throat swelling, SOB or lightheadedness with hypotension: No Has patient had a PCN reaction causing severe rash involving mucus membranes or skin necrosis: No Has patient had a PCN reaction that required hospitalization No Has patient had a PCN reaction occurring within the last 10 years: No If all of the above answers are "NO", then may proceed with Cephalosporin use      Home Medications: (Not in a hospital  admission)   OB/GYN Status:  No LMP recorded. Patient is postmenopausal.  General Assessment Data [facility] Assessment[facility] T[facility] Assessment: In system Is this a Tele or Face-to-Face Assessment?: Tele Assessment Is this an Initial Assessment or a Re-assessment for this encounter?: Initial Assessment Patient Accompanied by:: N/A Language Other than English: No Living Arrangements: Other (Comment)(home) What gender do you identify as?: Female Marital status: Single Maiden name: Byrnes Pregnancy Status: No Living Arrangements: Parent, Other relatives, Non-relatives/Friends Can pt return to current living arrangement?: Yes Admission Status: Voluntary Is patient capable of signing voluntary admission?: Yes Referral Source: Self/Family/Friend Insurance type: Medicare     Crisis Care Plan Living Arrangements: Parent, Other relatives, Non-relatives/Friends Legal Guardian: Other:(Self) Name of Psychiatrist: [facility]  Name of Therapist: [facility]  Education Status Is patient currently in school?: No Is the patient employed, unemployed or receiving disability?: Receiving disability income  Risk to self with the past 6 months Suicidal Ideation: Yes-Currently Present Has patient been a risk to self within the past 6 months prior to admission? : Yes Suicidal Intent: Yes-Currently Present Has patient had any suicidal intent within the past 6 months prior to admission? : Yes Is patient at risk for suicide?: Yes Suicidal Plan?: Yes-Currently Present Has patient had any suicidal plan within the past 6 months prior to admission? : Yes Specify Current Suicidal Plan: (OD on medication) Access to Means: Yes Specify Access to Suicidal Means: can get to medicine What has been your use of drugs/alcohol within the last 12 months?: none Previous Attempts/Gestures: No How many times?: 0 Other Self Harm Risks: scratching self Triggers for Past Attempts: None known Intentional Self Injurious Behavior:  Damaging Comment - Self Injurious Behavior: scratching Family Suicide History: Unknown Recent stressful life event(s): Other (Comment)(weather) Persecutory voices/beliefs?: No Depression: Yes Depression Symptoms: Insomnia, Despondent, Loss of interest in usual pleasures Substance abuse history and/or treatment for substance abuse?: No Suicide prevention information given to non-admitted patients: Not applicable  Risk to Others within the past 6 months Homicidal Ideation: Yes-Currently Present Does patient have any lifetime risk of violence toward others beyond the six months prior to admission? : No Thoughts of Harm to Others: Yes-Currently Present Comment - Thoughts of Harm to Others: has thoughts of killing someone Current Homicidal Intent: Yes-Currently Present Current Homicidal Plan: Yes-Currently Present Describe Current Homicidal Plan: stab person Access to Homicidal Means: No History of harm to others?: No Assessment of Violence: None Noted Violent Behavior Description: none Does patient have access to weapons?: No Criminal Charges Pending?: No Does patient have a court date: No Is patient on probation?: No  Psychosis Hallucinations: Visual Delusions: None noted  Mental Status Report Appearance/Hygiene: In scrubs Eye Contact: Good Motor Activity: Freedom of movement, Unremarkable Speech: Unremarkable Level of Consciousness: Alert Mood: Depressed Affect: Depressed Anxiety Level: None Thought Processes: Coherent, Relevant Judgement: Impaired Orientation: Person, Place, Time, Situation Obsessive Compulsive  Thoughts/Behaviors: None  Cognitive Functioning Concentration: Normal Memory: Recent Intact, Remote Intact Is patient IDD: (unknown) Insight: Poor Impulse Control: Poor Appetite: Poor Have you had any weight changes? : No Change Sleep: No Change Total Hours of Sleep: 8 Vegetative Symptoms: None  ADLScreening [facility] Assessment Services) Patient's cognitive  ability adequate to safely complete daily activities?: Yes Patient able to express need for assistance with ADLs?: Yes Independently performs ADLs?: Yes (appropriate for developmental age)  Prior Inpatient Therapy Prior Inpatient Therapy: Yes Prior Therapy Dates: unknown Prior Therapy Facilty/Provider(s): unknown Reason for Treatment: IDD and Depression   Prior Outpatient Therapy Prior Outpatient Therapy: Yes Prior Therapy Dates: current Prior Therapy Facilty/Provider(s): [facility] C[facility] Reason for Treatment: depression Does patient have an ACCT team?: No Does patient have Intensive In-House Services?  : No Does patient have [facility] services? : No Does patient have P4CC services?: No  ADL Screening (condition at time of admission) Patient's cognitive ability adequate to safely complete daily activities?: Yes Patient able to express need for assistance with ADLs?: Yes Independently performs ADLs?: Yes (appropriate for developmental age)       Abuse/Neglect Assessment (Assessment to be complete while patient is alone) Abuse/Neglect Assessment Can Be Completed: Yes Physical Abuse: Denies Verbal Abuse: Denies Sexual Abuse: Denies Exploitation of patient/patient's resources: Denies Self-Neglect: Denies Values / Beliefs Cultural Requests During Hospitalization: None Spiritual Requests During Hospitalization: None Consults Spiritual Care Consult Needed: No Social Work Consult Needed: No Merchant navy officerAdvance Directives (For Healthcare) Does Patient Have a Medical Advance Directive?: No Would patient like information on creating a medical advance directive?: No - Patient declined          Disposition:  Disposition Initial Assessment Completed for this Encounter: Yes   NP Nira ConnJason Berry recommends the pt be observed overnight for safety and stabilization.  The pt is to be reassessed by psychiatry in the morning.  RN Arlys JohnBrian and MD Scheidler were made aware of the recommendation.   This service  was provided via telemedicine using a 2-way, interactive audio and video technology.  Names of all persons participating in this telemedicine service and their role in this encounter. Name: Tempie DonningKimberly Sedano Role: Pt  Name: Flo Role: CST worker via phone  Name:  Role:   Name:  Role:     Ottis StainGarvin, Lillyth Spong Jermaine 05/17/2018 10:05 PM

## 2018-05-17 NOTE — ED Provider Notes (Signed)
[facility] Provider Note   CSN: [id number] Arrival date & time: 05/17/18  1947     History   Chief Complaint Chief Complaint  Patient presents with  . Suicidal    HPI [name] is a 53 y.o. female.  HPI   [name] is a 53 y.o. female with PMH of asthma, anxiety, borderline personality disorder, cerebral palsy, COPD, depression, GERD, mild cognitive impairment, schizoaffective disorder and wound abscess who presents with suicidal and homicidal ideation.  She reports that the weather has her feeling more depressed and she does not like snow or rain.  She is frustrated in general with her life.  Does not feel that she is being abused by anyone.  Endorses suicidal ideation with a plan to overdose on her medications.  She reports that she has done the same in the past.  Denies purposeful overdose in the last 1 to 2 days.  Denies drug or alcohol use at this time.  No recent falls or trauma.  She states that she has command auditory hallucinations telling her to take pills and kill herself.  Reports seeing things flying around her intermittently that she thinks her visual hallucinations.  Reports vague homicidal ideations.  Ports compliance with all of her home prescriptions.  Past Medical History:  Diagnosis Date  . Anxiety   . Asthma   . Borderline personality disorder (HCC)   . Cerebral palsy (HCC)   . COPD (chronic obstructive pulmonary disease) (HCC)   . Depression   . GERD (gastroesophageal reflux disease)   . Mild cognitive impairment   . Schizoaffective disorder (HCC)   . Wound abscess     Patient Active Problem List   Diagnosis Date Noted  . Severe recurrent major depression without psychotic features (HCC) 08/10/2016  . Overdose by acetaminophen 08/08/2016  . Schizoaffective disorder, bipolar type (HCC)   . 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 GERD (gastroesophageal reflux disease) 09/24/2014  . COPD (chronic  obstructive pulmonary disease) (HCC) 09/24/2014  . Borderline personality disorder (HCC) 09/24/2014  . Cerebral palsy (HCC) 08/31/2014    Past Surgical History:  Procedure Laterality Date  . CHOLECYSTECTOMY    . INCISION AND DRAINAGE ABSCESS Right 01/02/2015   Procedure: INCISION AND DRAINAGE ABSCESS;  Surgeon: Tiney Rougealph Ely III, MD;  Location: [facility] ORS;  Service: General;  Laterality: Right;  . TONSILLECTOMY       OB History    Gravida  0   Para  0   Term  0   Preterm  0   AB  0   Living  0     SAB  0   TAB  0   Ectopic  0   Multiple  0   Live Births               Home Medications    Prior to Admission medications   Medication Sig Start Date End Date Taking? Authorizing Provider  alum & mag hydroxide-simeth (MAALOX/MYLANTA) 200-200-20 MG/5ML suspension Take 30 mLs by mouth every 8 (eight) hours as needed for indigestion or heartburn. 12/14/17   Clapacs, Jackquline DenmarkJohn T, MD  ARIPiprazole (ABILIFY) 5 MG tablet Take 1 tablet (5 mg total) by mouth daily. 12/14/17   Clapacs, Jackquline DenmarkJohn T, MD  aspirin EC 81 MG tablet Take 1 tablet (81 mg total) by mouth daily. 12/14/17   Clapacs, Jackquline DenmarkJohn T, MD  cetirizine (ZYRTEC) 10 MG tablet Take 1 tablet (10 mg total) by mouth daily. 12/14/17  Clapacs, Jackquline Denmark, MD  clonazePAM (KLONOPIN) 0.5 MG tablet Take 1 tablet (0.5 mg total) by mouth 2 (two) times daily. 12/14/17   Clapacs, Jackquline Denmark, MD  divalproex (DEPAKOTE) 500 MG DR tablet Take 1 tablet (500 mg total) by mouth 2 (two) times daily. 12/14/17   Clapacs, Jackquline Denmark, MD  escitalopram (LEXAPRO) 20 MG tablet Take 1 tablet (20 mg total) by mouth daily. 12/14/17   Clapacs, Jackquline Denmark, MD  fenofibrate 160 MG tablet Take 1 tablet (160 mg total) by mouth daily. 12/14/17   Clapacs, Jackquline Denmark, MD  Fluticasone-Salmeterol (ADVAIR) 250-50 MCG/DOSE AEPB Inhale 1 puff into the lungs 2 (two) times daily. 12/14/17   Clapacs, Jackquline Denmark, MD  folic acid (FOLVITE) 1 MG tablet Take 1 tablet (1 mg total) by mouth daily. 12/14/17   Clapacs, Jackquline Denmark,  MD  furosemide (LASIX) 20 MG tablet Take 1 tablet (20 mg total) by mouth daily. 12/14/17   Clapacs, Jackquline Denmark, MD  hydrOXYzine (ATARAX/VISTARIL) 25 MG tablet Take 1 tablet (25 mg total) by mouth every 6 (six) hours as needed for anxiety. 05/11/18   Fayrene Helper, PA-C  levothyroxine (SYNTHROID, LEVOTHROID) 50 MCG tablet Take 1 tablet (50 mcg total) by mouth daily before breakfast. 12/14/17   Clapacs, Jackquline Denmark, MD  liver oil-zinc oxide (DESITIN) 40 % ointment Apply 1 application topically as needed for irritation. 01/25/18   Vena Austria, MD  oxybutynin (DITROPAN-XL) 5 MG 24 hr tablet Take 1 tablet (5 mg total) by mouth daily. 12/14/17   Clapacs, Jackquline Denmark, MD  pantoprazole (PROTONIX) 40 MG tablet Take 1 tablet (40 mg total) by mouth daily. 12/14/17   Clapacs, Jackquline Denmark, MD  polyethylene glycol (MIRALAX / GLYCOLAX) packet Take 17 g by mouth daily as needed. 12/14/17   Clapacs, Jackquline Denmark, MD  potassium chloride (K-DUR) 10 MEQ tablet Take 1 tablet (10 mEq total) by mouth daily. 12/14/17   Clapacs, Jackquline Denmark, MD  risperiDONE (RISPERDAL) 3 MG tablet Take 1 tablet (3 mg total) by mouth 2 (two) times daily. 12/14/17   Clapacs, Jackquline Denmark, MD  sucralfate (CARAFATE) 1 g tablet Take 1 tablet (1 g total) by mouth 4 (four) times daily -  with meals and at bedtime. 12/14/17   Clapacs, Jackquline Denmark, MD  vitamin B-12 (CYANOCOBALAMIN) 1000 MCG tablet Take 1 tablet (1,000 mcg total) by mouth daily. 12/14/17   Clapacs, Jackquline Denmark, MD    Family History Family History  Problem Relation Age of Onset  . Heart attack Father   . Diabetes Mother     Social History Social History   Tobacco Use  . Smoking status: Former Games developer  . Smokeless tobacco: Never Used  Substance Use Topics  . Alcohol use: No  . Drug use: No     Allergies   Penicillins   Review of Systems Review of Systems  Constitutional: Negative for chills and fever.  HENT: Negative for ear pain and sore throat.   Eyes: Negative for pain and visual disturbance.  Respiratory:  Negative for cough and shortness of breath.   Cardiovascular: Negative for chest pain and palpitations.  Gastrointestinal: Negative for abdominal pain and vomiting.  Genitourinary: Negative for dysuria and hematuria.  Musculoskeletal: Negative for arthralgias and back pain.  Skin: Negative for color change and rash.  Neurological: Negative for seizures and syncope.  Psychiatric/Behavioral: Positive for dysphoric mood, hallucinations, self-injury and suicidal ideas. Negative for behavioral problems and sleep disturbance. The patient is nervous/anxious.   All other systems reviewed  and are negative.    Physical Exam Updated Vital Signs BP 120/80 (BP Location: Right Arm)   Pulse 70   Temp 98.1 F (36.7 C) (Oral)   Resp 18   SpO2 99%   Physical Exam Vitals signs and nursing note reviewed.  Constitutional:      General: She is not in acute distress.    Appearance: Normal appearance. She is well-developed. She is not diaphoretic.  HENT:     Head: Normocephalic and atraumatic.  Eyes:     Conjunctiva/sclera: Conjunctivae normal.  Neck:     Musculoskeletal: Neck supple.  Cardiovascular:     Rate and Rhythm: Normal rate and regular rhythm.     Heart sounds: No murmur.  Pulmonary:     Effort: Pulmonary effort is normal. No respiratory distress.     Breath sounds: Normal breath sounds.  Abdominal:     Palpations: Abdomen is soft.     Tenderness: There is no abdominal tenderness.  Skin:    General: Skin is warm and dry.  Neurological:     Mental Status: She is alert.  Psychiatric:        Attention and Perception: Attention normal.        Mood and Affect: Mood is depressed. Affect is flat.        Speech: Speech normal.        Behavior: Behavior normal. Behavior is cooperative.        Thought Content: Thought content includes homicidal and suicidal ideation. Thought content includes suicidal plan. Thought content does not include homicidal plan.     Comments: Patient repeatedly  asking "please keep me tonight".      ED Treatments / Results  Labs (all labs ordered are listed, but only abnormal results are displayed) Labs Reviewed  COMPREHENSIVE METABOLIC PANEL - Abnormal; Notable for the following components:      Result Value   Calcium 8.5 (*)    All other components within normal limits  ACETAMINOPHEN LEVEL - Abnormal; Notable for the following components:   Acetaminophen (Tylenol), Serum <10 (*)    All other components within normal limits  I-STAT BETA HCG BLOOD, ED ([facility], [facility], [facility] ONLY) - Abnormal; Notable for the following components:   I-stat hCG, quantitative 6.0 (*)    All other components within normal limits  ETHANOL  SALICYLATE LEVEL  CBC  RAPID URINE DRUG SCREEN, HOSP PERFORMED  I-STAT BETA HCG BLOOD, ED ([facility], [facility], [facility] ONLY)    EKG None  Radiology No results found.  Procedures Procedures (including critical care time)  Medications Ordered in ED Medications - No data to display   Initial Impression / Assessment and Plan / ED Course  I have reviewed the triage vital signs and the nursing notes.  Pertinent labs & imaging results that were available during my care of the patient were reviewed by me and considered in my medical decision making (see chart for details).     MDM:  Imaging: None indicated at this time.    ED Provider Interpretation of EKG: None indicated at this time.   05/02/2017)  Labs: UDS negative, CMP negative, EtOH negative, salicylate negative, Tylenol negative, CBC unremarkable, i-STAT hCG 6.0 (previously 8.7 on   On initial evaluation, patient appears well. Afebrile and hemodynamically stable. Alert and oriented at her baseline at this time.  Presents with suicidal ideation as detailed above.  On exam, patient has no acute pathology although does appear to have a depressed affect and her mood is  congruent.  Endorses suicidal ideation with a plan to overdose and endorses command auditory hallucinations.  She does not  appear to be responding to internal stimuli at this time.  Asks repeatedly if she can be kept overnight and admitted.  No evidence for trauma at this time and denies ingestion.  Ethanol, salicylate, significant negative, labs are unremarkable with no evidence for metabolic pathology.  CBC normal.  However, slight elevation of her hCG which is 6.0.  This was previously 8.7 on 05/02/2017 at outside ED where she was being seen for psychiatric evaluation as well.  Denies pelvic complaints.  Suspect this is not secondary to pregnancy and is declining from her prior.  No abdominal pain or nausea or vomiting.  No vaginal bleeding or discharge.  Labs overall unremarkable for acute pathology.  She is medically clear.  Vitals normal.  CTS consulted and evaluated patient at the bedside.  They recommend observation in the ED overnight with reassessment in the morning.  This was conveyed to the oncoming team.  See their notes for additional details.  The plan for this patient was discussed with Dr. Ethelda Chick who voiced agreement and who oversaw evaluation and treatment of this patient.   The patient was fully informed and involved with the history taking, evaluation, workup including labs/images, and plan. The patient's concerns and questions were addressed to the patient's satisfaction and she expressed agreement with the plan.    Final Clinical Impressions(s) / ED Diagnoses   Final diagnoses:  Suicidal ideation    ED Discharge Orders    None       Wyolene Weimann, Sherryle Lis, MD 05/18/18 0040    Doug Sou, MD 05/18/18 0100

## 2018-05-17 NOTE — ED Provider Notes (Signed)
Patient reports feeling suicidal.  Her plan was to overdose with medication.  She is alert Glasgow Coma Score 15.  Mildly anxious appearing   Doug Sou, MD 05/17/18 2351

## 2018-05-17 NOTE — ED Notes (Signed)
attempted to draw labs. Pt nor bed in hallway.

## 2018-05-18 ENCOUNTER — Other Ambulatory Visit: Payer: Self-pay

## 2018-05-18 DIAGNOSIS — F333 Major depressive disorder, recurrent, severe with psychotic symptoms: Secondary | ICD-10-CM | POA: Diagnosis not present

## 2018-05-18 LAB — TSH: TSH: 5.291 u[IU]/mL — ABNORMAL HIGH (ref 0.350–4.500)

## 2018-05-18 MED ORDER — ESCITALOPRAM OXALATE 10 MG PO TABS
20.0000 mg | ORAL_TABLET | Freq: Every day | ORAL | Status: DC
  Administered 2018-05-18: 20 mg via ORAL
  Filled 2018-05-18: qty 2

## 2018-05-18 MED ORDER — VITAMIN D3 25 MCG (1000 UNIT) PO TABS
5000.0000 [IU] | ORAL_TABLET | Freq: Every day | ORAL | Status: DC
  Administered 2018-05-18: 5000 [IU] via ORAL
  Filled 2018-05-18 (×2): qty 5

## 2018-05-18 MED ORDER — VITAMIN B-12 1000 MCG PO TABS
1000.0000 ug | ORAL_TABLET | Freq: Every day | ORAL | Status: DC
  Administered 2018-05-18: 1000 ug via ORAL
  Filled 2018-05-18: qty 1

## 2018-05-18 MED ORDER — BISMUTH SUBSALICYLATE 262 MG/15ML PO SUSP
30.0000 mL | Freq: Four times a day (QID) | ORAL | Status: DC | PRN
  Filled 2018-05-18: qty 236

## 2018-05-18 MED ORDER — DIVALPROEX SODIUM 250 MG PO DR TAB
500.0000 mg | DELAYED_RELEASE_TABLET | Freq: Two times a day (BID) | ORAL | Status: DC
  Administered 2018-05-18: 500 mg via ORAL
  Filled 2018-05-18: qty 2

## 2018-05-18 MED ORDER — OXYBUTYNIN CHLORIDE ER 5 MG PO TB24
5.0000 mg | ORAL_TABLET | Freq: Every day | ORAL | Status: DC
  Administered 2018-05-18: 5 mg via ORAL
  Filled 2018-05-18: qty 1

## 2018-05-18 MED ORDER — FENOFIBRATE 160 MG PO TABS
160.0000 mg | ORAL_TABLET | Freq: Every day | ORAL | Status: DC
  Administered 2018-05-18: 160 mg via ORAL
  Filled 2018-05-18: qty 1

## 2018-05-18 MED ORDER — FUROSEMIDE 20 MG PO TABS: 20.0000 mg | ORAL_TABLET | Freq: Every day | ORAL | Status: DC | PRN

## 2018-05-18 MED ORDER — HYDROXYZINE HCL 25 MG PO TABS: 25.0000 mg | ORAL_TABLET | Freq: Four times a day (QID) | ORAL | Status: DC | PRN

## 2018-05-18 MED ORDER — PANTOPRAZOLE SODIUM 40 MG PO TBEC
40.0000 mg | DELAYED_RELEASE_TABLET | Freq: Every day | ORAL | Status: DC
  Administered 2018-05-18: 40 mg via ORAL
  Filled 2018-05-18: qty 1

## 2018-05-18 MED ORDER — RISPERIDONE 3 MG PO TABS
3.0000 mg | ORAL_TABLET | Freq: Every day | ORAL | Status: DC
  Administered 2018-05-18: 3 mg via ORAL
  Filled 2018-05-18: qty 1

## 2018-05-18 MED ORDER — LORATADINE 10 MG PO TABS
10.0000 mg | ORAL_TABLET | Freq: Every day | ORAL | Status: DC
  Administered 2018-05-18: 10 mg via ORAL
  Filled 2018-05-18: qty 1

## 2018-05-18 MED ORDER — CLONAZEPAM 0.5 MG PO TABS
0.5000 mg | ORAL_TABLET | Freq: Two times a day (BID) | ORAL | Status: DC
  Administered 2018-05-18: 0.5 mg via ORAL
  Filled 2018-05-18: qty 1

## 2018-05-18 MED ORDER — FLUTICASONE FUROATE-VILANTEROL 200-25 MCG/INH IN AEPB
1.0000 | INHALATION_SPRAY | Freq: Every day | RESPIRATORY_TRACT | Status: DC
  Administered 2018-05-18: 1 via RESPIRATORY_TRACT
  Filled 2018-05-18: qty 28

## 2018-05-18 MED ORDER — ASPIRIN EC 81 MG PO TBEC
81.0000 mg | DELAYED_RELEASE_TABLET | Freq: Every day | ORAL | Status: DC
  Administered 2018-05-18: 81 mg via ORAL
  Filled 2018-05-18: qty 1

## 2018-05-18 MED ORDER — SUCRALFATE 1 G PO TABS
1.0000 g | ORAL_TABLET | Freq: Three times a day (TID) | ORAL | Status: DC
  Administered 2018-05-18: 1 g via ORAL
  Filled 2018-05-18: qty 1

## 2018-05-18 NOTE — ED Notes (Signed)
Pt in shower.  

## 2018-05-18 NOTE — Progress Notes (Signed)
Patient is seen by me via tele-psych and I have consulted with Dr. Lucianne MussKumar.  Patient is claiming that she has suicidal ideations with no plan.  Patient also reports that she is homicidal towards a couple which is different than what she is stated last night because they spent a check that she gave them for rent.  Patient is diagnosed with cerebral palsy, schizoaffective disorder, and mild IDD.  Patient is followed by [facility] CST and they have been contacted.  CST states that the patient commonly endorses SI when she is upset or is not getting her way.  They report that she is supposed to be going to a group home on Monday but yesterday they had a discussion with her that she may not go on Monday and then the patient was in the emergency department stating that.  The CST stated that her SI is not a credible threat and that there are no safety concerns for her to discharge home to her mom.  Spoke with Leafy HalfAlyssa Marie, PA-C and discussed the plan.  [facility] up at 11 AM.  This is for ensured safety and for transportation home this also allows for the CST to continue following the patient throughout the weekend to ensure safety.  At this time patient does not meet inpatient criteria and is psychiatrically cleared.  I have contacted Leafy HalfAlyssa Marie, PA-C and notified her of the recommendations.

## 2018-05-18 NOTE — ED Notes (Addendum)
Pt noted to be wearing paper scrubs and eyeglasses. Pt asking x 2 if she is being d/c'd.

## 2018-05-18 NOTE — ED Notes (Signed)
Pt states she had spoken w/her Product manager on phone who advised her of tx plan - being d/c'd. Pt voices agreement and understanding of tx plan. Pt requesting to be given her belongings - advised pt will be given when d/c papers are received.

## 2018-05-18 NOTE — ED Notes (Signed)
bfast tray ordered 

## 2018-05-18 NOTE — Progress Notes (Signed)
CSW contacted "Flo" [phone]) with [facility]'s Community Support Team. [facility] familiar with patient. Flo does not believe patient's HI toward "Nicky Pugh" is a credible threat. Per Flo, patient has a pattern of voicing SI/HI when she becomes upset with her living arrangements or something not going her way.  Flo shares he thinks the patient was upset by learning she may not be able to transfer to her group home on Monday (02/03).  Flo states he has no safety concerns if patient is to be discharged today.  Flo will pick up the patient at approximately 11am  Enid Cutter, LCSW-A Clinical Social Worker [phone]

## 2018-05-18 NOTE — ED Notes (Signed)
Pt on phone at nurses' desk. 

## 2018-05-18 NOTE — ED Notes (Signed)
Pt ambulated to bathroom and back to room w/o difficulty.  

## 2018-05-18 NOTE — ED Notes (Signed)
Pt given [place] sandwich and then asked for snack after.  Pt given saltine crackers.  Pt continues to ask for food, pt aware and signed paperwork regarding meals and snacks.  Pt reminded of the plan of care.

## 2018-05-18 NOTE — Discharge Instructions (Signed)
Please follow up with your CST team.  Please return for any new or worsening symptoms or thoughts of harm.

## 2018-05-18 NOTE — ED Provider Notes (Signed)
Psychiatric Default Provider Note:   Patient is a 53 year old female with past medical history as below presenting for psychiatric evaluation after she made call to mobile crisis for feeling suicidal.   8:43 AM Spoke with Feliz Beam money, FNP who is on-call psychiatric provider.  According to him, he has been on the phone with the patient CST team.  According to patient CST team, suicidality is patient's baseline and she frequently does this when not desiring to be at home.  They state that patient will call mobile crisis who will recommend transfer to the emergency department rather than calling her treatment team with CST.  Patient currently lives with her mother, brother and sister-in-law, however supposed to go to group home in the coming week.  Per discussion with Feliz Beam money, FNP, he will contact patient CST team to, do face-to-face evaluation to determine that she is at her baseline. Patient is psychiatrically cleared.  Spoke with patient CST representative, Mr. Thompson prior to transfer home. .  Will be taking patient home.  Does not have concerns about patient safety.  CST team also handles patient's home medications and transfer to her medical appointments.  Patient had slightly elevated TSH today.  Unclear if she has been taking her levothyroxine.  She has an appointment in 2 days with her primary care provider.  This information was provided to show at patient's primary care provider clinic.  Patient denying suicidal ideations at time of discharge.  Psychiatrically cleared by psychiatric nurse practitioner.  Stable for discharge.    Elisha Ponder, PA-C 05/18/18 1612    Mancel Bale, MD 05/18/18 [phone]

## 2018-05-18 NOTE — ED Notes (Signed)
D/C papers given to [employer] and all belongings - 1 labeled belongings bag and 1 valuables envelope - returned to pt.

## 2018-05-26 ENCOUNTER — Emergency Department (HOSPITAL_COMMUNITY)
Admission: EM | Admit: 2018-05-26 | Discharge: 2018-05-27 | Disposition: A | Discharge status: Home / Self Care | Payer: Medicare Other | Attending: Emergency Medicine | Admitting: Emergency Medicine

## 2018-05-26 ENCOUNTER — Encounter (HOSPITAL_COMMUNITY): Payer: Self-pay | Admitting: Emergency Medicine

## 2018-05-26 ENCOUNTER — Other Ambulatory Visit: Payer: Self-pay

## 2018-05-26 DIAGNOSIS — F333 Major depressive disorder, recurrent, severe with psychotic symptoms: Secondary | ICD-10-CM | POA: Insufficient documentation

## 2018-05-26 DIAGNOSIS — F70 Mild intellectual disabilities: Secondary | ICD-10-CM | POA: Diagnosis not present

## 2018-05-26 DIAGNOSIS — Z87891 Personal history of nicotine dependence: Secondary | ICD-10-CM | POA: Diagnosis not present

## 2018-05-26 DIAGNOSIS — Z7982 Long term (current) use of aspirin: Secondary | ICD-10-CM | POA: Diagnosis not present

## 2018-05-26 DIAGNOSIS — R45851 Suicidal ideations: Secondary | ICD-10-CM | POA: Insufficient documentation

## 2018-05-26 DIAGNOSIS — F332 Major depressive disorder, recurrent severe without psychotic features: Secondary | ICD-10-CM | POA: Diagnosis not present

## 2018-05-26 DIAGNOSIS — J449 Chronic obstructive pulmonary disease, unspecified: Secondary | ICD-10-CM | POA: Insufficient documentation

## 2018-05-26 DIAGNOSIS — F4325 Adjustment disorder with mixed disturbance of emotions and conduct: Secondary | ICD-10-CM | POA: Diagnosis present

## 2018-05-26 DIAGNOSIS — Z79899 Other long term (current) drug therapy: Secondary | ICD-10-CM | POA: Diagnosis not present

## 2018-05-26 DIAGNOSIS — F250 Schizoaffective disorder, bipolar type: Secondary | ICD-10-CM | POA: Diagnosis present

## 2018-05-26 DIAGNOSIS — F603 Borderline personality disorder: Secondary | ICD-10-CM | POA: Diagnosis present

## 2018-05-26 DIAGNOSIS — F09 Unspecified mental disorder due to known physiological condition: Secondary | ICD-10-CM

## 2018-05-26 DIAGNOSIS — F329 Major depressive disorder, single episode, unspecified: Secondary | ICD-10-CM | POA: Diagnosis present

## 2018-05-26 LAB — COMPREHENSIVE METABOLIC PANEL
ALT: 16 U/L (ref 0–44)
AST: 22 U/L (ref 15–41)
Albumin: 3.4 g/dL — ABNORMAL LOW (ref 3.5–5.0)
Alkaline Phosphatase: 38 U/L (ref 38–126)
Anion gap: 11 (ref 5–15)
BUN: 19 mg/dL (ref 6–20)
CALCIUM: 8.7 mg/dL — AB (ref 8.9–10.3)
CO2: 23 mmol/L (ref 22–32)
CREATININE: 0.8 mg/dL (ref 0.44–1.00)
Chloride: 102 mmol/L (ref 98–111)
GFR calc Af Amer: >60 mL/min (ref >60)
GFR calc non Af Amer: >60 mL/min (ref >60)
Glucose, Bld: 115 mg/dL — ABNORMAL HIGH (ref 70–99)
Potassium: 3.8 mmol/L (ref 3.5–5.1)
Sodium: 136 mmol/L (ref 135–145)
Total Bilirubin: 0.6 mg/dL (ref 0.3–1.2)
Total Protein: 6.7 g/dL (ref 6.5–8.1)

## 2018-05-26 LAB — CBC
HCT: 40.6 % (ref 36.0–46.0)
Hemoglobin: 13.2 g/dL (ref 12.0–15.0)
MCH: 31.9 pg (ref 26.0–34.0)
MCHC: 32.5 g/dL (ref 30.0–36.0)
MCV: 98.1 fL (ref 80.0–100.0)
Platelets: 273 10*3/uL (ref 150–400)
RBC: 4.14 MIL/uL (ref 3.87–5.11)
RDW: 12.6 % (ref 11.5–15.5)
WBC: 7.1 10*3/uL (ref 4.0–10.5)
nRBC: 0 % (ref 0.0–0.2)

## 2018-05-26 LAB — I-STAT BETA HCG BLOOD, ED (MC, WL, AP ONLY): HCG, QUANTITATIVE: 7.6 m[IU]/mL — AB (ref <5)

## 2018-05-26 LAB — RAPID URINE DRUG SCREEN, HOSP PERFORMED
Amphetamines: NOT DETECTED
Barbiturates: NOT DETECTED
Benzodiazepines: NOT DETECTED
Cocaine: NOT DETECTED
Opiates: NOT DETECTED
Tetrahydrocannabinol: NOT DETECTED

## 2018-05-26 LAB — SALICYLATE LEVEL: Salicylate Lvl: <7 mg/dL (ref 2.8–30.0)

## 2018-05-26 LAB — ACETAMINOPHEN LEVEL: Acetaminophen (Tylenol), Serum: <10 ug/mL — ABNORMAL LOW (ref 10–30)

## 2018-05-26 LAB — VALPROIC ACID LEVEL: VALPROIC ACID LVL: 33 ug/mL — AB (ref 50.0–100.0)

## 2018-05-26 LAB — ETHANOL: Alcohol, Ethyl (B): <10 mg/dL (ref <10)

## 2018-05-26 MED ORDER — SUCRALFATE 1 G PO TABS
1.0000 g | ORAL_TABLET | Freq: Three times a day (TID) | ORAL | Status: DC
  Administered 2018-05-27 (×2): 1 g via ORAL
  Filled 2018-05-26 (×2): qty 1

## 2018-05-26 MED ORDER — FUROSEMIDE 20 MG PO TABS: 20.0000 mg | ORAL_TABLET | Freq: Every day | ORAL | Status: DC | PRN

## 2018-05-26 MED ORDER — FENOFIBRATE 160 MG PO TABS
160.0000 mg | ORAL_TABLET | Freq: Every day | ORAL | Status: DC
  Administered 2018-05-27: 160 mg via ORAL
  Filled 2018-05-26 (×2): qty 1

## 2018-05-26 MED ORDER — CLONAZEPAM 0.5 MG PO TABS
0.5000 mg | ORAL_TABLET | Freq: Two times a day (BID) | ORAL | Status: DC
  Administered 2018-05-26 – 2018-05-27 (×2): 0.5 mg via ORAL
  Filled 2018-05-26 (×2): qty 1

## 2018-05-26 MED ORDER — HYDROXYZINE HCL 25 MG PO TABS
25.0000 mg | ORAL_TABLET | Freq: Four times a day (QID) | ORAL | Status: DC | PRN
  Administered 2018-05-27: 25 mg via ORAL
  Filled 2018-05-26: qty 1

## 2018-05-26 MED ORDER — ESCITALOPRAM OXALATE 10 MG PO TABS
20.0000 mg | ORAL_TABLET | Freq: Every day | ORAL | Status: DC
  Administered 2018-05-27: 20 mg via ORAL
  Filled 2018-05-26: qty 2

## 2018-05-26 MED ORDER — ASPIRIN EC 81 MG PO TBEC
81.0000 mg | DELAYED_RELEASE_TABLET | Freq: Every day | ORAL | Status: DC
  Administered 2018-05-27: 81 mg via ORAL
  Filled 2018-05-26: qty 1

## 2018-05-26 MED ORDER — RISPERIDONE 3 MG PO TABS
3.0000 mg | ORAL_TABLET | Freq: Every day | ORAL | Status: DC
  Administered 2018-05-26: 3 mg via ORAL
  Filled 2018-05-26: qty 1

## 2018-05-26 MED ORDER — PANTOPRAZOLE SODIUM 40 MG PO TBEC
40.0000 mg | DELAYED_RELEASE_TABLET | Freq: Every day | ORAL | Status: DC
  Administered 2018-05-27: 40 mg via ORAL
  Filled 2018-05-26: qty 1

## 2018-05-26 MED ORDER — OXYBUTYNIN CHLORIDE ER 5 MG PO TB24
5.0000 mg | ORAL_TABLET | Freq: Every day | ORAL | Status: DC
  Administered 2018-05-27: 5 mg via ORAL
  Filled 2018-05-26 (×2): qty 1

## 2018-05-26 NOTE — ED Triage Notes (Signed)
Pt brought to triage by [employer] voluntarily.  States she is suicidal with a plan to cut her wrist.

## 2018-05-26 NOTE — ED Notes (Addendum)
Per Ala Dach at [facility] recommended for pt to have inpatient treatment and there are currently no beds.

## 2018-05-26 NOTE — ED Notes (Signed)
Security notified to wand pt 

## 2018-05-26 NOTE — ED Notes (Signed)
Pt has been wanded by security. 

## 2018-05-26 NOTE — BH Assessment (Addendum)
Tele Assessment Note   Patient Name: [name] MRN: [id number] Referring Physician: Lorre Nick, MD Location of Patient: [facility], WPT Location of Provider: Behavioral Health TTS Department  [name] is an 53 y.o. single female who presents unaccompanied to [facility] ED after being transported voluntarily by law enforcement. Pt has a history of depression and psychotic symptoms and is followed by [employer] Team. Pt says she contacted her support team who recommended she come to ED. Pt reports current suicidal ideation with plan to cut her wrist with a knife or overdose on prescription medications. Pt says she has access to a knife and access to "ten different mediations" at home. Pt says "I'm scared, frightened and I wish I was dead." Pt acknowledges symptoms including crying spells, loss of interest in usual pleasures, fatigue, decreased concentration, decreased sleep and feelings of guilt and hopelessness. She reports one previous suicide attempt by overdose. She says she harms herself by biting herself and scratching her skin with a hair comb. She says she is experiencing auditory hallucinations of voices telling her to harm herself. She reports visual hallucinations of "flies buzzing in front of my face." She denies current homicidal ideation or history of violence. She denies alcohol or other substance use.  Pt identifies her primary stressor as a scheduled interview tomorrow to be admitted to a group home. She says she wants to be admitted to the group home and fears "they will turn me down." She says she currently lives with her mother, sister-in-law and nephew. She says her father died 27 years ago and she is still grieving. Pt's medical record indicates Pt has a mild intellectual disability.  Pt is acknowledges she is receiving CST and medication management through [employer]. She says she is taking all medications as  prescribed. She reports she has been psychiatrically hospitalized in the past but cannot recall dates or locations. Pt has a history of multiple presentations to emergency departments and was evaluated and psychiatrically cleared on 05/18/18.  This TTS counselor contacted Artist Pais Little with [facility] [phone] via telephone. She says she is familiar with Pt and that Pt has called repeatedly for days. She said normally staff is able to redirect Pt and assist her with using coping skills but today those efforts were unsuccessful. Ms. Clarene Duke said Pt often presents with suicidal ideation but now is expressing specific plans, which is not her normal presentation.   Pt is dressed in hospital scrubs, alert and oriented x4. Pt speaks in a flat tone, at moderate volume and normal pace. Motor behavior appears normal. Eye contact is good. Pt's mood is depressed and anxious, affect is anxious. Thought process is coherent and relevant. There is no apparent indication Pt is currently responding to internal stimuli or experiencing delusional thought content. Pt was polite and cooperative throughout assessment. She repeatedly states "Will you help me" and says she wants to be admitted to a psychiatric facility.   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 Anxiety   . Asthma   . Borderline personality disorder (HCC)   . Cerebral palsy (HCC)   . COPD (chronic obstructive pulmonary disease) (HCC)   . Depression   . GERD (gastroesophageal reflux disease)   . Mild cognitive impairment   . Schizoaffective disorder (HCC)   . Wound abscess  Past Surgical History:  Procedure Laterality Date  . CHOLECYSTECTOMY    . INCISION AND DRAINAGE ABSCESS Right 01/02/2015   Procedure: INCISION AND DRAINAGE ABSCESS;  Surgeon:  Tiney Rouge III, MD;  Location: [facility] ORS;  Service: General;  Laterality: Right;  . TONSILLECTOMY      Family History:  Family History  Problem Relation Age of Onset  . Heart attack Father   . Diabetes Mother     Social History:  reports that she has quit smoking. She has never used smokeless tobacco. She reports that she does not drink alcohol or use drugs.  Additional Social History:  Alcohol / Drug Use Pain Medications: See MAR Prescriptions: See MAR Over the Counter: See MAR History of alcohol / drug use?: No history of alcohol / drug abuse Longest period of sobriety (when/how long): NA  CIWA: CIWA-Ar BP: (!) 138/93 Pulse Rate: 72 COWS:    Allergies:  Allergies  Allergen Reactions  . Penicillins Nausea And Vomiting and Other (See Comments)    Has patient had a PCN reaction causing immediate rash, facial/tongue/throat swelling, SOB or lightheadedness with hypotension: No Has patient had a PCN reaction causing severe rash involving mucus membranes or skin necrosis: No Has patient had a PCN reaction that required hospitalization No Has patient had a PCN reaction occurring within the last 10 years: No If all of the above answers are "NO", then may proceed with Cephalosporin use      Home Medications: (Not in a hospital admission)   OB/GYN Status:  No LMP recorded. Patient is postmenopausal.  General Assessment Data Location of Assessment: [facility] ED TTS Assessment: In system Is this a Tele or Face-to-Face Assessment?: Tele Assessment Is this an Initial Assessment or a Re-assessment for this encounter?: Initial Assessment Patient Accompanied by:: N/A(Alone) Language Other than English: No Living Arrangements: Other (Comment)(Lives with mother, sister-in-law and nephew) What gender do you identify as?: Female Marital status: Single Maiden name: Netter Pregnancy Status: No Living Arrangements: Parent, Other relatives, Non-relatives/Friends Can pt return to current  living arrangement?: Yes Admission Status: Voluntary Is patient capable of signing voluntary admission?: Yes Referral Source: Self/Family/Friend Insurance type: Medicare     Crisis Care Plan Living Arrangements: Parent, Other relatives, Non-relatives/Friends Legal Guardian: Other:(Self) Name of Psychiatrist: [facility]  Name of Therapist: [facility]  Education Status Is patient currently in school?: No Is the patient employed, unemployed or receiving disability?: Receiving disability income  Risk to self with the past 6 months Suicidal Ideation: Yes-Currently Present Has patient been a risk to self within the past 6 months prior to admission? : Yes Suicidal Intent: Yes-Currently Present Has patient had any suicidal intent within the past 6 months prior to admission? : Yes Is patient at risk for suicide?: Yes Suicidal Plan?: Yes-Currently Present Has patient had any suicidal plan within the past 6 months prior to admission? : Yes Specify Current Suicidal Plan: Cut her wrist with a knife or OD on prescription medications Access to Means: Yes Specify Access to Suicidal Means: Pt reports access to knife and "10 different medications" What has been your use of drugs/alcohol within the last 12 months?: Pt denies Previous Attempts/Gestures: Yes How many times?: 1(Pt reports she has overdosed in the past) Other Self Harm Risks: Pt reports she bites herself and scratches her skin with a hair comb Triggers for Past Attempts: Other (Comment)(Hallucinations) Intentional Self Injurious Behavior: Damaging Comment - Self Injurious Behavior: Pt reports she bites herself and scratches her skin with a hair comb Family  Suicide History: Unknown Recent stressful life event(s): Other (Comment)(Pending group home placement) Persecutory voices/beliefs?: Yes Depression: Yes Depression Symptoms: Despondent, Tearfulness, Isolating, Guilt, Loss of interest in usual pleasures, Feeling worthless/self pity Substance  abuse history and/or treatment for substance abuse?: No Suicide prevention information given to non-admitted patients: Not applicable  Risk to Others within the past 6 months Homicidal Ideation: No Does patient have any lifetime risk of violence toward others beyond the six months prior to admission? : No Thoughts of Harm to Others: No Comment - Thoughts of Harm to Others: Pt denies thoughts of harming others Current Homicidal Intent: No Current Homicidal Plan: No Describe Current Homicidal Plan: None Access to Homicidal Means: No Identified Victim: None History of harm to others?: No Assessment of Violence: None Noted Violent Behavior Description: No known history of violence Does patient have access to weapons?: No Criminal Charges Pending?: No Does patient have a court date: No Is patient on probation?: No  Psychosis Hallucinations: Visual, Auditory(Sees "flies", hears voices telling her to kill herself) Delusions: None noted  Mental Status Report Appearance/Hygiene: In scrubs Motor Activity: Unremarkable Speech: Logical/coherent Level of Consciousness: Alert Mood: Depressed, Anxious Affect: Anxious Anxiety Level: Moderate Thought Processes: Coherent Judgement: Impaired Orientation: Person, Place, Time, Situation Obsessive Compulsive Thoughts/Behaviors: None  Cognitive Functioning Concentration: Normal Memory: Recent Intact, Remote Intact Is patient IDD: Yes Level of Function: Documented as "milld intellectual disability" Is IQ score available?: No Insight: Poor Impulse Control: Fair Appetite: Good Have you had any weight changes? : No Change Sleep: Decreased Total Hours of Sleep: 6 Vegetative Symptoms: None  ADLScreening [facility] Assessment Services) Patient's cognitive ability adequate to safely complete daily activities?: Yes Patient able to express need for assistance with ADLs?: Yes Independently performs ADLs?: Yes (appropriate for developmental  age)  Prior Inpatient Therapy Prior Inpatient Therapy: Yes Prior Therapy Dates: unknown Prior Therapy Facilty/Provider(s): unknown Reason for Treatment: IDD and Depression   Prior Outpatient Therapy Prior Outpatient Therapy: Yes Prior Therapy Dates: current Prior Therapy Facilty/Provider(s): [facility] Reason for Treatment: depression Does patient have an ACCT team?: No Does patient have Intensive In-House Services?  : No Does patient have [facility] services? : No Does patient have P4CC services?: No  ADL Screening (condition at time of admission) Patient's cognitive ability adequate to safely complete daily activities?: Yes Is the patient deaf or have difficulty hearing?: No Does the patient have difficulty seeing, even when wearing glasses/contacts?: No Does the patient have difficulty concentrating, remembering, or making decisions?: No Patient able to express need for assistance with ADLs?: Yes Does the patient have difficulty dressing or bathing?: No Independently performs ADLs?: Yes (appropriate for developmental age) Does the patient have difficulty walking or climbing stairs?: No Weakness of Legs: None Weakness of Arms/Hands: None  Home Assistive Devices/Equipment Home Assistive Devices/Equipment: None    Abuse/Neglect Assessment (Assessment to be complete while patient is alone) Abuse/Neglect Assessment Can Be Completed: Yes Physical Abuse: Denies Verbal Abuse: Denies Sexual Abuse: Denies Exploitation of patient/patient's resources: Denies Self-Neglect: Denies     Merchant navy officerAdvance Directives (For Healthcare) Does Patient Have a Medical Advance Directive?: No Would patient like information on creating a medical advance directive?: No - Patient declined          Disposition: Gave clinical report to Nira ConnJason Berry, NP who said Pt meets criteria for inpatient psychiatric treatment. TTS will contact other facilities for placement. Notified Shanna CiscoJamie Ward, MD and Tenna DelaineKaren Newman, RN of  recommendation.  Disposition Initial Assessment Completed for this Encounter: Yes  This  service was provided via telemedicine using a 2-way, interactive audio and Immunologistvideo technology.  Names of all persons participating in this telemedicine service and their role in this encounter. Name: Tempie DonningKimberly Hickey Role: Patient  Name: Shela CommonsFord Marely Apgar Jr, [facility] Role: TTS counselor         Harlin RainFord Ellis Patsy BaltimoreWarrick Jr, [facility], [facility], [facility] Triage Specialist [phone]  Pamalee LeydenWarrick Jr, Shahrzad Koble Ellis 05/26/2018 9:48 PM

## 2018-05-26 NOTE — ED Notes (Signed)
TTS in progress 

## 2018-05-26 NOTE — ED Notes (Signed)
All belongings placed in locker 3

## 2018-05-26 NOTE — ED Notes (Signed)
Pt done with TTS. Security notified to wand patient.

## 2018-05-26 NOTE — ED Provider Notes (Signed)
[facility]   CSN: [id number] Arrival date & time: 05/26/18  2045     History   Chief Complaint Chief Complaint  Patient presents with  . Suicidal    HPI [name] is a 53 y.o. female.  53 year old female with long history of psychiatric disorder presents with suicidal ideations due to not being allowed into a group home.  Denies any current ingestion.  No homicidal ideations.  Patient has not been responding to internal stimuli.  Has been assessed by behavioral health and recommended for inpatient hospitalization.     Past Medical History:  Diagnosis Date  . Anxiety   . Asthma   . Borderline personality disorder (HCC)   . Cerebral palsy (HCC)   . COPD (chronic obstructive pulmonary disease) (HCC)   . Depression   . GERD (gastroesophageal reflux disease)   . Mild cognitive impairment   . Schizoaffective disorder (HCC)   . Wound abscess     Patient Active Problem List   Diagnosis Date Noted  . Severe recurrent major depression without psychotic features (HCC) 08/10/2016  . Overdose by acetaminophen 08/08/2016  . Schizoaffective disorder, bipolar type (HCC)   . 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 GERD (gastroesophageal reflux disease) 09/24/2014  . COPD (chronic obstructive pulmonary disease) (HCC) 09/24/2014  . Borderline personality disorder (HCC) 09/24/2014  . Cerebral palsy (HCC) 08/31/2014    Past Surgical History:  Procedure Laterality Date  . CHOLECYSTECTOMY    . INCISION AND DRAINAGE ABSCESS Right 01/02/2015   Procedure: INCISION AND DRAINAGE ABSCESS;  Surgeon: Tiney Rougealph Ely III, MD;  Location: [facility] ORS;  Service: General;  Laterality: Right;  . TONSILLECTOMY       OB History    Gravida  0   Para  0   Term  0   Preterm  0   AB  0   Living  0     SAB  0   TAB  0   Ectopic  0   Multiple  0   Live Births               Home Medications    Prior to Admission  medications   Medication Sig Start Date End Date Taking? Authorizing Provider  alum & mag hydroxide-simeth (MAALOX/MYLANTA) 200-200-20 MG/5ML suspension Take 30 mLs by mouth every 8 (eight) hours as needed for indigestion or heartburn. Patient not taking: Reported on 05/18/2018 12/14/17   Clapacs, Jackquline DenmarkJohn T, MD  ARIPiprazole (ABILIFY) 5 MG tablet Take 1 tablet (5 mg total) by mouth daily. Patient not taking: Reported on 05/18/2018 12/14/17   Clapacs, Jackquline DenmarkJohn T, MD  aspirin EC 81 MG tablet Take 1 tablet (81 mg total) by mouth daily. 12/14/17   Clapacs, Jackquline DenmarkJohn T, MD  bismuth subsalicylate (PEPTO BISMOL) 262 MG/15ML suspension Take 30 mLs by mouth every 6 (six) hours as needed for indigestion.    [provider]  cetirizine (ZYRTEC) 10 MG tablet Take 1 tablet (10 mg total) by mouth daily. Patient taking differently: Take 10 mg by mouth daily as needed for allergies.  12/14/17   Clapacs, Jackquline DenmarkJohn T, MD  Cholecalciferol (VITAMIN D3) 125 MCG (5000 UT) TABS Take 1 tablet by mouth daily.    [provider]  clonazePAM (KLONOPIN) 0.5 MG tablet Take 1 tablet (0.5 mg total) by mouth 2 (two) times daily. 12/14/17   Clapacs, Jackquline DenmarkJohn T, MD  divalproex (DEPAKOTE) 500 MG DR tablet Take 1 tablet (500 mg total) by mouth  2 (two) times daily. 12/14/17   Clapacs, Jackquline DenmarkJohn T, MD  escitalopram (LEXAPRO) 20 MG tablet Take 1 tablet (20 mg total) by mouth daily. 12/14/17   Clapacs, Jackquline DenmarkJohn T, MD  fenofibrate 160 MG tablet Take 1 tablet (160 mg total) by mouth daily. 12/14/17   Clapacs, Jackquline DenmarkJohn T, MD  Fluticasone-Salmeterol (ADVAIR) 250-50 MCG/DOSE AEPB Inhale 1 puff into the lungs 2 (two) times daily. 12/14/17   Clapacs, Jackquline DenmarkJohn T, MD  folic acid (FOLVITE) 1 MG tablet Take 1 tablet (1 mg total) by mouth daily. Patient not taking: Reported on 05/18/2018 12/14/17   Clapacs, Jackquline DenmarkJohn T, MD  furosemide (LASIX) 20 MG tablet Take 1 tablet (20 mg total) by mouth daily. Patient taking differently: Take 20 mg by mouth daily as needed for fluid.  12/14/17    Clapacs, Jackquline DenmarkJohn T, MD  hydrOXYzine (ATARAX/VISTARIL) 25 MG tablet Take 1 tablet (25 mg total) by mouth every 6 (six) hours as needed for anxiety. 05/11/18   Fayrene Helperran, Bowie, PA-C  levothyroxine (SYNTHROID, LEVOTHROID) 50 MCG tablet Take 1 tablet (50 mcg total) by mouth daily before breakfast. Patient not taking: Reported on 05/18/2018 12/14/17   Clapacs, Jackquline DenmarkJohn T, MD  liver oil-zinc oxide (DESITIN) 40 % ointment Apply 1 application topically as needed for irritation. 01/25/18   Vena AustriaStaebler, Andreas, MD  oxybutynin (DITROPAN-XL) 5 MG 24 hr tablet Take 1 tablet (5 mg total) by mouth daily. 12/14/17   Clapacs, Jackquline DenmarkJohn T, MD  pantoprazole (PROTONIX) 40 MG tablet Take 1 tablet (40 mg total) by mouth daily. 12/14/17   Clapacs, Jackquline DenmarkJohn T, MD  polyethylene glycol (MIRALAX / GLYCOLAX) packet Take 17 g by mouth daily as needed. Patient not taking: Reported on 05/18/2018 12/14/17   Clapacs, Jackquline DenmarkJohn T, MD  potassium chloride (K-DUR) 10 MEQ tablet Take 1 tablet (10 mEq total) by mouth daily. Patient not taking: Reported on 05/18/2018 12/14/17   Clapacs, Jackquline DenmarkJohn T, MD  risperiDONE (RISPERDAL) 3 MG tablet Take 1 tablet (3 mg total) by mouth 2 (two) times daily. Patient taking differently: Take 3 mg by mouth daily.  12/14/17   Clapacs, Jackquline DenmarkJohn T, MD  sucralfate (CARAFATE) 1 g tablet Take 1 tablet (1 g total) by mouth 4 (four) times daily -  with meals and at bedtime. 12/14/17   Clapacs, Jackquline DenmarkJohn T, MD  vitamin B-12 (CYANOCOBALAMIN) 1000 MCG tablet Take 1 tablet (1,000 mcg total) by mouth daily. 12/14/17   Clapacs, Jackquline DenmarkJohn T, MD    Family History Family History  Problem Relation Age of Onset  . Heart attack Father   . Diabetes Mother     Social History Social History   Tobacco Use  . Smoking status: Former Games developermoker  . Smokeless tobacco: Never Used  Substance Use Topics  . Alcohol use: No  . Drug use: No     Allergies   Penicillins   Review of Systems Review of Systems  All other systems reviewed and are negative.    Physical  Exam Updated Vital Signs BP (!) 138/93   Pulse 72   Temp 97.6 F (36.4 C) (Oral)   Resp 16   SpO2 96%   Physical Exam Vitals signs and nursing note reviewed.  Constitutional:      General: She is not in acute distress.    Appearance: Normal appearance. She is well-developed. She is not toxic-appearing.  HENT:     Head: Normocephalic and atraumatic.  Eyes:     General: Lids are normal.     Conjunctiva/sclera: Conjunctivae normal.  Pupils: Pupils are equal, round, and reactive to light.  Neck:     Musculoskeletal: Normal range of motion and neck supple.     Thyroid: No thyroid mass.     Trachea: No tracheal deviation.  Cardiovascular:     Rate and Rhythm: Normal rate and regular rhythm.     Heart sounds: Normal heart sounds. No murmur. No gallop.   Pulmonary:     Effort: Pulmonary effort is normal. No respiratory distress.     Breath sounds: Normal breath sounds. No stridor. No decreased breath sounds, wheezing, rhonchi or rales.  Abdominal:     General: Bowel sounds are normal. There is no distension.     Palpations: Abdomen is soft.     Tenderness: There is no abdominal tenderness. There is no rebound.  Musculoskeletal: Normal range of motion.        General: No tenderness.  Skin:    General: Skin is warm and dry.     Findings: No abrasion or rash.  Neurological:     General: No focal deficit present.     Mental Status: She is alert and oriented to person, place, and time.     GCS: GCS eye subscore is 4. GCS verbal subscore is 5. GCS motor subscore is 6.     Cranial Nerves: No cranial nerve deficit.     Sensory: No sensory deficit.  Psychiatric:        Attention and Perception: She is attentive.        Speech: Speech normal.        Behavior: Behavior normal.        Thought Content: Thought content includes suicidal ideation. Thought content includes suicidal plan.      ED Treatments / Results  Labs (all labs ordered are listed, but only abnormal results are  displayed) Labs Reviewed  COMPREHENSIVE METABOLIC PANEL - Abnormal; Notable for the following components:      Result Value   Glucose, Bld 115 (*)    Calcium 8.7 (*)    Albumin 3.4 (*)    All other components within normal limits  ACETAMINOPHEN LEVEL - Abnormal; Notable for the following components:   Acetaminophen (Tylenol), Serum <10 (*)    All other components within normal limits  ETHANOL  SALICYLATE LEVEL  CBC  RAPID URINE DRUG SCREEN, HOSP PERFORMED  I-STAT BETA HCG BLOOD, ED ([facility], [facility], [facility] ONLY)    EKG None  Radiology No results found.  Procedures Procedures (including critical care time)  Medications Ordered in ED Medications  fenofibrate tablet 160 mg (has no administration in time range)  clonazePAM (KLONOPIN) tablet 0.5 mg (has no administration in time range)  sucralfate (CARAFATE) tablet 1 g (has no administration in time range)  risperiDONE (RISPERDAL) tablet 3 mg (has no administration in time range)  pantoprazole (PROTONIX) EC tablet 40 mg (has no administration in time range)  oxybutynin (DITROPAN-XL) 24 hr tablet 5 mg (has no administration in time range)  hydrOXYzine (ATARAX/VISTARIL) tablet 25 mg (has no administration in time range)  furosemide (LASIX) tablet 20 mg (has no administration in time range)  escitalopram (LEXAPRO) tablet 20 mg (has no administration in time range)  aspirin EC tablet 81 mg (has no administration in time range)     Initial Impression / Assessment and Plan / ED Course  I have reviewed the triage vital signs and the nursing notes.  Pertinent labs & imaging results that were available during my care of the patient were reviewed by  me and considered in my medical decision making (see chart for details).     Patient's medications restarted.  Will check Depakote level before restarting.  She is preliminarily medically cleared for inpatient hospitalization  Final Clinical Impressions(s) / ED Diagnoses   Final diagnoses:  None     ED Discharge Orders    None       Lorre Nick, MD 05/26/18 2227

## 2018-05-27 ENCOUNTER — Encounter (HOSPITAL_COMMUNITY): Payer: Self-pay

## 2018-05-27 ENCOUNTER — Emergency Department (EMERGENCY_DEPARTMENT_HOSPITAL)
Admission: EM | Admit: 2018-05-27 | Discharge: 2018-05-29 | Disposition: A | Discharge status: Home / Self Care | Payer: Medicare Other | Source: Home / Self Care | Attending: Emergency Medicine | Admitting: Emergency Medicine

## 2018-05-27 ENCOUNTER — Other Ambulatory Visit: Payer: Self-pay

## 2018-05-27 DIAGNOSIS — F603 Borderline personality disorder: Secondary | ICD-10-CM | POA: Diagnosis present

## 2018-05-27 DIAGNOSIS — F4325 Adjustment disorder with mixed disturbance of emotions and conduct: Secondary | ICD-10-CM | POA: Diagnosis present

## 2018-05-27 DIAGNOSIS — R45851 Suicidal ideations: Secondary | ICD-10-CM

## 2018-05-27 DIAGNOSIS — F333 Major depressive disorder, recurrent, severe with psychotic symptoms: Secondary | ICD-10-CM | POA: Diagnosis not present

## 2018-05-27 DIAGNOSIS — F70 Mild intellectual disabilities: Secondary | ICD-10-CM | POA: Diagnosis present

## 2018-05-27 LAB — CBC WITH DIFFERENTIAL/PLATELET
Abs Immature Granulocytes: 0.05 10*3/uL (ref 0.00–0.07)
Basophils Absolute: 0.1 10*3/uL (ref 0.0–0.1)
Basophils Relative: 1 %
Eosinophils Absolute: 0.2 10*3/uL (ref 0.0–0.5)
Eosinophils Relative: 2 %
HCT: 38.3 % (ref 36.0–46.0)
Hemoglobin: 12.5 g/dL (ref 12.0–15.0)
Immature Granulocytes: 1 %
Lymphocytes Relative: 29 %
Lymphs Abs: 2.2 10*3/uL (ref 0.7–4.0)
MCH: 32.1 pg (ref 26.0–34.0)
MCHC: 32.6 g/dL (ref 30.0–36.0)
MCV: 98.2 fL (ref 80.0–100.0)
Monocytes Absolute: 0.8 10*3/uL (ref 0.1–1.0)
Monocytes Relative: 11 %
NEUTROS PCT: 56 %
Neutro Abs: 4.3 10*3/uL (ref 1.7–7.7)
Platelets: 260 10*3/uL (ref 150–400)
RBC: 3.9 MIL/uL (ref 3.87–5.11)
RDW: 12.7 % (ref 11.5–15.5)
WBC: 7.6 10*3/uL (ref 4.0–10.5)
nRBC: 0 % (ref 0.0–0.2)

## 2018-05-27 LAB — COMPREHENSIVE METABOLIC PANEL
ALT: 17 U/L (ref 0–44)
ANION GAP: 9 (ref 5–15)
AST: 20 U/L (ref 15–41)
Albumin: 3.8 g/dL (ref 3.5–5.0)
Alkaline Phosphatase: 37 U/L — ABNORMAL LOW (ref 38–126)
BUN: 23 mg/dL — ABNORMAL HIGH (ref 6–20)
CO2: 26 mmol/L (ref 22–32)
Calcium: 9 mg/dL (ref 8.9–10.3)
Chloride: 105 mmol/L (ref 98–111)
Creatinine, Ser: 0.74 mg/dL (ref 0.44–1.00)
GFR calc Af Amer: >60 mL/min (ref >60)
GFR calc non Af Amer: >60 mL/min (ref >60)
Glucose, Bld: 91 mg/dL (ref 70–99)
POTASSIUM: 3.6 mmol/L (ref 3.5–5.1)
Sodium: 140 mmol/L (ref 135–145)
Total Bilirubin: 0.3 mg/dL (ref 0.3–1.2)
Total Protein: 7 g/dL (ref 6.5–8.1)

## 2018-05-27 LAB — ETHANOL

## 2018-05-27 LAB — ACETAMINOPHEN LEVEL: Acetaminophen (Tylenol), Serum: <10 ug/mL — ABNORMAL LOW (ref 10–30)

## 2018-05-27 LAB — SALICYLATE LEVEL: Salicylate Lvl: <7 mg/dL (ref 2.8–30.0)

## 2018-05-27 MED ORDER — ACETAMINOPHEN 325 MG PO TABS: 650.0000 mg | ORAL_TABLET | ORAL | Status: DC | PRN

## 2018-05-27 MED ORDER — ACETAMINOPHEN 500 MG PO TABS
1000.0000 mg | ORAL_TABLET | Freq: Once | ORAL | Status: AC
  Administered 2018-05-27: 1000 mg via ORAL
  Filled 2018-05-27: qty 2

## 2018-05-27 MED ORDER — ALUM & MAG HYDROXIDE-SIMETH 200-200-20 MG/5ML PO SUSP: 30.0000 mL | Freq: Four times a day (QID) | ORAL | Status: DC | PRN

## 2018-05-27 MED ORDER — LORAZEPAM 1 MG PO TABS
1.0000 mg | ORAL_TABLET | Freq: Once | ORAL | Status: AC
  Administered 2018-05-27: 1 mg via ORAL
  Filled 2018-05-27: qty 1

## 2018-05-27 NOTE — Progress Notes (Signed)
CSW called [facility] Psych ED and spoke with RN, Liz Beach, to advise that patient was seen by [facility] Physician Extender, Nanine Means, NP, and does not meet inpatient criteria and is recommended for d/c.    Timmothy Euler. Kaylyn Lim, MSW, LCSWA Disposition Clinical Social Work [phone] (cell) [phone] (office)

## 2018-05-27 NOTE — ED Notes (Signed)
Breakfast Tray ordered  

## 2018-05-27 NOTE — ED Notes (Signed)
Bed: [facility] Expected date:  Expected time:  Means of arrival:  Comments: 

## 2018-05-27 NOTE — ED Provider Notes (Signed)
[facility]-EMERGENCY DEPT Provider Note   CSN: [id number] Arrival date & time: 05/27/18  1818     History   Chief Complaint Chief Complaint  Patient presents with  . Suicidal    HPI [name] is a 53 y.o. female who presents the emergency department for suicidal ideation.  She has a history of MR, schizoaffective disorder, borderline personality disorder.  States that she is suicidal and she wants to hurt other people.  She was [facility] and [facility] at [facility] earlier today and is being discharged.  She is attended by her sister in law who is currently keeping her in the house.  They also have her 52 year old mother.  Apparently the patient was kicked out of her group home back in December for stating that she was suicidal and would hurt others at the house.  The patient sister-in-law states that they are feeling extremely overwhelmed because she is has many behavioral problems, they both work and during the day the 738 year old mother cannot handle her.  She also states that the group home that kicked her out is still receiving her Social Security check and they have been unable to find placement for her because they do not have access to her payment funds.  The sister-in-law states that she and her husband feel like they are at wits and.  The patient states that she feels like she just wants to die.  HPI  Past Medical History:  Diagnosis Date  . Anxiety   . Asthma   . Borderline personality disorder (HCC)   . Cerebral palsy (HCC)   . COPD (chronic obstructive pulmonary disease) (HCC)   . Depression   . GERD (gastroesophageal reflux disease)   . Mild cognitive impairment   . Schizoaffective disorder (HCC)   . Wound abscess     Patient Active Problem List   Diagnosis Date Noted  . 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 Overdose by acetaminophen 08/08/2016  . Schizoaffective disorder, bipolar type (HCC)   . Mild  intellectual disability 09/24/2014  . GERD (gastroesophageal reflux disease) 09/24/2014  . COPD (chronic obstructive pulmonary disease) (HCC) 09/24/2014  . Borderline personality disorder (HCC) 09/24/2014  . Cerebral palsy (HCC) 08/31/2014    Past Surgical History:  Procedure Laterality Date  . CHOLECYSTECTOMY    . INCISION AND DRAINAGE ABSCESS Right 01/02/2015   Procedure: INCISION AND DRAINAGE ABSCESS;  Surgeon: Tiney Rougealph Ely III, MD;  Location: [facility] ORS;  Service: General;  Laterality: Right;  . TONSILLECTOMY       OB History    Gravida  0   Para  0   Term  0   Preterm  0   AB  0   Living  0     SAB  0   TAB  0   Ectopic  0   Multiple  0   Live Births               Home Medications    Prior to Admission medications   Medication Sig Start Date End Date Taking? Authorizing Provider  alum & mag hydroxide-simeth (MAALOX/MYLANTA) 200-200-20 MG/5ML suspension Take 30 mLs by mouth every 8 (eight) hours as needed for indigestion or heartburn. Patient not taking: Reported on 05/18/2018 12/14/17   Clapacs, Jackquline DenmarkJohn T, MD  ARIPiprazole (ABILIFY) 5 MG tablet Take 1 tablet (5 mg total) by mouth daily. Patient not taking: Reported on 05/18/2018 12/14/17   Clapacs, Jackquline DenmarkJohn T, MD  aspirin  EC 81 MG tablet Take 1 tablet (81 mg total) by mouth daily. 12/14/17   Clapacs, Jackquline DenmarkJohn T, MD  bismuth subsalicylate (PEPTO BISMOL) 262 MG/15ML suspension Take 30 mLs by mouth every 6 (six) hours as needed for indigestion.    [provider]  cetirizine (ZYRTEC) 10 MG tablet Take 1 tablet (10 mg total) by mouth daily. Patient taking differently: Take 10 mg by mouth daily as needed for allergies.  12/14/17   Clapacs, Jackquline DenmarkJohn T, MD  Cholecalciferol (VITAMIN D3) 125 MCG (5000 UT) TABS Take 1 tablet by mouth daily.    [provider]  clonazePAM (KLONOPIN) 0.5 MG tablet Take 1 tablet (0.5 mg total) by mouth 2 (two) times daily. 12/14/17   Clapacs, Jackquline DenmarkJohn T, MD  divalproex (DEPAKOTE) 500 MG DR tablet  Take 1 tablet (500 mg total) by mouth 2 (two) times daily. 12/14/17   Clapacs, Jackquline DenmarkJohn T, MD  escitalopram (LEXAPRO) 20 MG tablet Take 1 tablet (20 mg total) by mouth daily. 12/14/17   Clapacs, Jackquline DenmarkJohn T, MD  fenofibrate 160 MG tablet Take 1 tablet (160 mg total) by mouth daily. 12/14/17   Clapacs, Jackquline DenmarkJohn T, MD  Fluticasone-Salmeterol (ADVAIR) 250-50 MCG/DOSE AEPB Inhale 1 puff into the lungs 2 (two) times daily. Patient not taking: Reported on 05/27/2018 12/14/17   Clapacs, Jackquline DenmarkJohn T, MD  folic acid (FOLVITE) 1 MG tablet Take 1 tablet (1 mg total) by mouth daily. Patient not taking: Reported on 05/18/2018 12/14/17   Clapacs, Jackquline DenmarkJohn T, MD  furosemide (LASIX) 20 MG tablet Take 1 tablet (20 mg total) by mouth daily. Patient taking differently: Take 20 mg by mouth daily as needed for fluid.  12/14/17   Clapacs, Jackquline DenmarkJohn T, MD  hydrOXYzine (ATARAX/VISTARIL) 25 MG tablet Take 1 tablet (25 mg total) by mouth every 6 (six) hours as needed for anxiety. 05/11/18   Fayrene Helperran, Bowie, PA-C  levothyroxine (SYNTHROID, LEVOTHROID) 50 MCG tablet Take 1 tablet (50 mcg total) by mouth daily before breakfast. Patient not taking: Reported on 05/18/2018 12/14/17   Clapacs, Jackquline DenmarkJohn T, MD  liver oil-zinc oxide (DESITIN) 40 % ointment Apply 1 application topically as needed for irritation. 01/25/18   Vena AustriaStaebler, Andreas, MD  oxybutynin (DITROPAN-XL) 5 MG 24 hr tablet Take 1 tablet (5 mg total) by mouth daily. Patient not taking: Reported on 05/27/2018 12/14/17   Clapacs, Jackquline DenmarkJohn T, MD  pantoprazole (PROTONIX) 40 MG tablet Take 1 tablet (40 mg total) by mouth daily. 12/14/17   Clapacs, Jackquline DenmarkJohn T, MD  polyethylene glycol (MIRALAX / GLYCOLAX) packet Take 17 g by mouth daily as needed. Patient not taking: Reported on 05/18/2018 12/14/17   Clapacs, Jackquline DenmarkJohn T, MD  potassium chloride (K-DUR) 10 MEQ tablet Take 1 tablet (10 mEq total) by mouth daily. Patient not taking: Reported on 05/18/2018 12/14/17   Clapacs, Jackquline DenmarkJohn T, MD  risperiDONE (RISPERDAL) 3 MG tablet Take 1 tablet (3 mg  total) by mouth 2 (two) times daily. Patient taking differently: Take 3 mg by mouth daily.  12/14/17   Clapacs, Jackquline DenmarkJohn T, MD  sucralfate (CARAFATE) 1 g tablet Take 1 tablet (1 g total) by mouth 4 (four) times daily -  with meals and at bedtime. 12/14/17   Clapacs, Jackquline DenmarkJohn T, MD  vitamin B-12 (CYANOCOBALAMIN) 1000 MCG tablet Take 1 tablet (1,000 mcg total) by mouth daily. Patient not taking: Reported on 05/27/2018 12/14/17   Clapacs, Jackquline DenmarkJohn T, MD    Family History Family History  Problem Relation Age of Onset  . Heart attack Father   .  Diabetes Mother     Social History Social History   Tobacco Use  . Smoking status: Former Games developer  . Smokeless tobacco: Never Used  Substance Use Topics  . Alcohol use: No  . Drug use: No     Allergies   Penicillins   Review of Systems Review of Systems  Ten systems reviewed and are negative for acute change, except as noted in the HPI.  Physical Exam Updated Vital Signs BP 138/82 (BP Location: Left Arm)   Pulse 81   Temp 97.9 F (36.6 C) (Oral)   Resp 18   Ht 5\' 4"  (1.626 m)   Wt 95.3 kg   SpO2 94%   BMI 36.05 kg/m   Physical Exam Vitals signs and nursing note reviewed.  Constitutional:      General: She is not in acute distress.    Appearance: She is well-developed. She is not diaphoretic.  HENT:     Head: Normocephalic and atraumatic.  Eyes:     General: No scleral icterus.    Conjunctiva/sclera: Conjunctivae normal.  Neck:     Musculoskeletal: Normal range of motion.  Cardiovascular:     Rate and Rhythm: Normal rate and regular rhythm.     Heart sounds: Normal heart sounds. No murmur. No friction rub. No gallop.   Pulmonary:     Effort: Pulmonary effort is normal. No respiratory distress.     Breath sounds: Normal breath sounds.  Abdominal:     General: Bowel sounds are normal. There is no distension.     Palpations: Abdomen is soft. There is no mass.     Tenderness: There is no abdominal tenderness. There is no guarding.    Skin:    General: Skin is warm and dry.  Neurological:     Mental Status: She is alert and oriented to person, place, and time.  Psychiatric:        Mood and Affect: Mood is anxious.      ED Treatments / Results  Labs (all labs ordered are listed, but only abnormal results are displayed) Labs Reviewed - No data to display  EKG None  Radiology No results found.  Procedures Procedures (including critical care time)  Medications Ordered in ED Medications - No data to display   Initial Impression / Assessment and Plan / ED Course  I have reviewed the triage vital signs and the nursing notes.  Pertinent labs & imaging results that were available during my care of the patient were reviewed by me and considered in my medical decision making (see chart for details).     Patient medically clear, awaiting TTS work-up  Final Clinical Impressions(s) / ED Diagnoses   Final diagnoses:  Suicidal ideations    ED Discharge Orders    None       Arthor Captain, PA-C 05/28/18 0014    Loren Racer, MD 05/31/18 2126

## 2018-05-27 NOTE — ED Notes (Signed)
[name] - sister in law [phone]

## 2018-05-27 NOTE — Discharge Instructions (Addendum)
1.  Patient needs to be seen by her counselor for recheck this week.  Continue to work with [employer] and Product manager for living arrangements and social needs.

## 2018-05-27 NOTE — Progress Notes (Signed)
At the request of the EDP, pt's brothe [name],[name] at [phone] was called due to the pt's family's complaint of not being able to get the pt admitted into another group home due to the pt's former group home still receiving the pt's benefits.  CSW provided pt's family with a list of Group Homes in the [facility] area (from [employer]) and contact information for the [place] as well as the contact information for [employer].  CSW counseled pt's brother on reporting fraudulent benefit appropriation by contacting APD/DSS and SSA.  Pt's brother voiced understanding, was appreciative and thanked the CSW.  EDP updated.  CSW will continue to follow for D/C needs.  Dorothe Pea. Zaiden Ludlum, LCSW, LCAS, CSI Clinical Social Worker Ph: [phone]

## 2018-05-27 NOTE — ED Triage Notes (Signed)
Pt brought in by [employer] voluntarily. Pt was just d/c from [facility] earlier today, again stating she is SI. Pt states that she got into a fight with her mother and cannot live with her anymore.

## 2018-05-27 NOTE — ED Notes (Signed)
Sister in law, sharon, called wanting an update if possible, says she has info to give? [phone]

## 2018-05-27 NOTE — Progress Notes (Addendum)
53 yo female who got upset with her family "fussing at me" and said she was suicidal and come to the ED.  On assessment, she has no suicidal/homicidal ideations, hallucinations, or substance abuse.  She has a Gaffer, [name], and [employer] with a Product manager.  Explained to her that this team needs to manage her care and living situation as she wants to go somewhere for a couple of days because she upset with her family.  Caveat:  Limited cognition with low threshold for frustration.  Patient is well known to this provider and ED for similar presentations, patient at her baseline.  Stable for discharge.  Dr Lucianne Muss reviewed this client and concurs with the plan to discharge to her care team or family.  Nanine Means, PMHNP

## 2018-05-27 NOTE — ED Notes (Signed)
Sister in law called wanting an update if possible, says she has info to give? [phone]

## 2018-05-28 DIAGNOSIS — F333 Major depressive disorder, recurrent, severe with psychotic symptoms: Secondary | ICD-10-CM | POA: Diagnosis not present

## 2018-05-28 MED ORDER — ESCITALOPRAM OXALATE 10 MG PO TABS
20.0000 mg | ORAL_TABLET | Freq: Every day | ORAL | Status: DC
  Administered 2018-05-28 – 2018-05-29 (×2): 20 mg via ORAL
  Filled 2018-05-28 (×2): qty 2

## 2018-05-28 MED ORDER — FENOFIBRATE 160 MG PO TABS
160.0000 mg | ORAL_TABLET | Freq: Every day | ORAL | Status: DC
  Administered 2018-05-28 – 2018-05-29 (×2): 160 mg via ORAL
  Filled 2018-05-28 (×2): qty 1

## 2018-05-28 MED ORDER — DIVALPROEX SODIUM 500 MG PO DR TAB
500.0000 mg | DELAYED_RELEASE_TABLET | Freq: Two times a day (BID) | ORAL | Status: DC
  Administered 2018-05-28 – 2018-05-29 (×3): 500 mg via ORAL
  Filled 2018-05-28 (×4): qty 1

## 2018-05-28 MED ORDER — CLONAZEPAM 0.5 MG PO TABS: 0.5000 mg | ORAL_TABLET | Freq: Two times a day (BID) | ORAL | Status: DC | PRN

## 2018-05-28 MED ORDER — HYDROXYZINE HCL 25 MG PO TABS: 25.0000 mg | ORAL_TABLET | Freq: Four times a day (QID) | ORAL | Status: DC | PRN

## 2018-05-28 MED ORDER — VITAMIN D 25 MCG (1000 UNIT) PO TABS
125.0000 ug | ORAL_TABLET | Freq: Every day | ORAL | Status: DC
  Administered 2018-05-28 – 2018-05-29 (×2): 125 ug via ORAL
  Filled 2018-05-28 (×2): qty 5

## 2018-05-28 MED ORDER — RISPERIDONE 2 MG PO TABS
3.0000 mg | ORAL_TABLET | Freq: Every day | ORAL | Status: DC
  Administered 2018-05-28 – 2018-05-29 (×2): 3 mg via ORAL
  Filled 2018-05-28 (×2): qty 1

## 2018-05-28 MED ORDER — ASPIRIN 81 MG PO CHEW
81.0000 mg | CHEWABLE_TABLET | Freq: Every day | ORAL | Status: DC
  Administered 2018-05-28 – 2018-05-29 (×2): 81 mg via ORAL
  Filled 2018-05-28 (×2): qty 1

## 2018-05-28 NOTE — ED Notes (Signed)
Reported to tech her chest was hurting. Vitals done and BP 105/58 and p=66. No noted history of her record of cardiac issues. Once in room and attending to her she had no further complaints of physical issues. Asked Clinical research associate to cover her up, she is acting helpless and asked when she would be moved across the street. She states she has no where to live because she doesn't want to live with her family, states she thinks of killing them. No further complaints of chest pain.

## 2018-05-28 NOTE — ED Notes (Signed)
Patient calm and cooperative asking what time she is going across the street.  Reassured patient that nurse would let her know.  Patient up to use the bathroom occasionally then going back to her room and resting in bed.  q 15 minute checks and video monitoring.

## 2018-05-28 NOTE — BH Assessment (Signed)
Assessment Note  Tempie DonningKimberly Sherk is an 53 y.o. female brought in by [employer] voluntarily reporting SI with a plan to walk in front of a car and HI with a plan to stab her family members. Patient [facility], patient was held overnight and discharged this morning. Patient reported she got into a fight with her mother and cannot live with her anymore. Patient reported triggers include her mother stating "my momma told me I was the worst daughter ever", "that broke my heart". Patient reported everyone continuously arguing today, including patient, mother and mothers boyfriend and sister in law. Patient continued to repeat "I am suicidal, homicidal, manic and angry". During assessment, patient attempted to bite herself and then stopped. Patient was cooperative and polite during assessment. Social work spoke with brother regarding fraudulent benefit appropriation by contacting APD/DSS and SSA, as family has complained about not being able to get the patient admitted into another group home due to the patients former group home still receiving the patients benefits.  Attempted to contact collateral contact. Will make another attempt. Clyde LundborgSharon Lurry, sister in [place], [phone].  PER TTS Assessment on 05/26/17: Tempie DonningKimberly Spano is an 53 y.o. single female who [facility] after being transported voluntarily by Patent examinerlaw enforcement. Pt has a history of depression and psychotic symptoms and is [facility] Support Team. Pt says she contacted her support team who recommended she come to ED. Pt reports current suicidal ideation with plan to cut her wrist with a knife or overdose on prescription medications. Pt says she has access to a knife and access to "ten different mediations" at home. Pt says "I'm scared, frightened and I wish I was dead." Pt acknowledges symptoms including crying spells, loss of interest in usual pleasures,  fatigue, decreased concentration, decreased sleep and feelings of guilt and hopelessness. She reports one previous suicide attempt by overdose. She says she harms herself by biting herself and scratching her skin with a hair comb. She says she is experiencing auditory hallucinations of voices telling her to harm herself. She reports visual hallucinations of "flies buzzing in front of my face." She denies current homicidal ideation or history of violence. She denies alcohol or other substance use. -Pt identifies her primary stressor as a scheduled interview tomorrow to be admitted to a group home. She says she wants to be admitted to the group home and fears "they will turn me down." She says she currently lives with her mother, sister-in-law and nephew. She says her father died 27 years ago and she is still grieving. Pt's medical record indicates Pt has a mild intellectual disability. -Pt is acknowledges she is receiving CST and medication management through [facility]. She says she is taking all medications as prescribed. She reports she has been psychiatrically hospitalized in the past but cannot recall dates or locations. Pt has a history of multiple presentations to emergency departments and was evaluated and psychiatrically cleared on 05/18/18. -This TTS counselor contacted Sueanne MargaritaShawna Little with [facility] [phone] via telephone. She says she is familiar with Pt and that Pt has called repeatedly for days. She said normally staff is able to redirect Pt and assist her with using coping skills but today those efforts were unsuccessful. Ms. Clarene DukeLittle said Pt often presents with suicidal ideation but now is expressing specific plans, which is not her normal presentation.    Diagnosis: F33.3 Major depressive disorder, Recurrent episode, With psychotic features F70 Intellectual  disability (intellectual developmental disorder), Mild (by history)  Past Medical History:  Past Medical History:   Diagnosis Date  . Anxiety   . Asthma   . Borderline personality disorder (HCC)   . Cerebral palsy (HCC)   . COPD (chronic obstructive pulmonary disease) (HCC)   . Depression   . GERD (gastroesophageal reflux disease)   . Mild cognitive impairment   . Schizoaffective disorder (HCC)   . Wound abscess     Past Surgical History:  Procedure Laterality Date  . CHOLECYSTECTOMY    . INCISION AND DRAINAGE ABSCESS Right 01/02/2015   Procedure: INCISION AND DRAINAGE ABSCESS;  Surgeon: Tiney Rouge III, MD;  Location: [facility] ORS;  Service: General;  Laterality: Right;  . TONSILLECTOMY      Family History:  Family History  Problem Relation Age of Onset  . Heart attack Father   . Diabetes Mother     Social History:  reports that she has quit smoking. She has never used smokeless tobacco. She reports that she does not drink alcohol or use drugs.  Additional Social History:  Alcohol / Drug Use Pain Medications: see MAR Prescriptions: see MAR Over the Counter: see MAR  CIWA: CIWA-Ar BP: 138/82 Pulse Rate: 81 COWS:    Allergies:  Allergies  Allergen Reactions  . Penicillins Nausea And Vomiting and Other (See Comments)    Has patient had a PCN reaction causing immediate rash, facial/tongue/throat swelling, SOB or lightheadedness with hypotension: No Has patient had a PCN reaction causing severe rash involving mucus membranes or skin necrosis: No Has patient had a PCN reaction that required hospitalization No Has patient had a PCN reaction occurring within the last 10 years: No If all of the above answers are "NO", then may proceed with Cephalosporin use      Home Medications: (Not in a hospital admission)   OB/GYN Status:  No LMP recorded. Patient is postmenopausal.  General Assessment Data Location of Assessment: [facility] ED TTS Assessment: In system Is this a Tele or Face-to-Face Assessment?: Face-to-Face Is this an Initial Assessment or a Re-assessment for this encounter?: Initial  Assessment Patient Accompanied by:: N/A Language Other than English: No Living Arrangements: (mother, mothers bfriend and sister in Social worker) What gender do you identify as?: Female Marital status: Single Pregnancy Status: Unknown Living Arrangements: Parent, Other relatives, Non-relatives/Friends Can pt return to current living arrangement?: Yes Admission Status: Voluntary Referral Source: Self/Family/Friend     Crisis Care Plan Living Arrangements: Parent, Other relatives, Non-relatives/Friends Legal Guardian: Mother Name of Psychiatrist: [facility]  Name of Therapist: [facility]  Education Status Is patient currently in school?: No Is the patient employed, unemployed or receiving disability?: Receiving disability income  Risk to self with the past 6 months Suicidal Ideation: Yes-Currently Present Has patient been a risk to self within the past 6 months prior to admission? : Yes Suicidal Intent: Yes-Currently Present Has patient had any suicidal intent within the past 6 months prior to admission? : Yes Is patient at risk for suicide?: Yes Suicidal Plan?: Yes-Currently Present Has patient had any suicidal plan within the past 6 months prior to admission? : Yes Specify Current Suicidal Plan: (walk in front of traffic) Access to Means: Yes Specify Access to Suicidal Means: (walk in front of traffic) What has been your use of drugs/alcohol within the last 12 months?: (none) Previous Attempts/Gestures: No How many times?: (unknown) Other Self Harm Risks: (biting self) Triggers for Past Attempts: (family discord) Intentional Self Injurious Behavior: (biting wrist) Comment - Self Injurious  Behavior: (biting self) Family Suicide History: Unknown Recent stressful life event(s): (family conflict) Persecutory voices/beliefs?: No Depression: Yes Depression Symptoms: Isolating, Fatigue, Feeling angry/irritable Substance abuse history and/or treatment for substance abuse?: No  Risk to Others within  the past 6 months Homicidal Ideation: Yes-Currently Present Does patient have any lifetime risk of violence toward others beyond the six months prior to admission? : Yes (comment) Thoughts of Harm to Others: Yes-Currently Present Comment - Thoughts of Harm to Others: (stab family members) Current Homicidal Intent: Yes-Currently Present Current Homicidal Plan: Yes-Currently Present Describe Current Homicidal Plan: (stab family members) Access to Homicidal Means: Yes Describe Access to Homicidal Means: (knives in the home) Identified Victim: (family members) History of harm to others?: (unknown) Assessment of Violence: None Noted Does patient have access to weapons?: Yes (Comment)(knives) Criminal Charges Pending?: No Does patient have a court date: No Is patient on probation?: No  Psychosis Hallucinations: None noted Delusions: None noted  Mental Status Report Appearance/Hygiene: In scrubs Eye Contact: Good Motor Activity: Freedom of movement Speech: Logical/coherent, Incoherent Level of Consciousness: Alert Mood: Depressed, Anxious Affect: Depressed, Anxious Anxiety Level: Moderate Thought Processes: Relevant Judgement: Partial Orientation: Person, Place, Time, Situation Obsessive Compulsive Thoughts/Behaviors: None  Cognitive Functioning Concentration: Fair Memory: Recent Intact Is patient IDD: Yes Level of Function: (Mild) Is IQ score available?: No Insight: Fair Impulse Control: Poor Appetite: Fair Have you had any weight changes? : No Change Sleep: No Change Total Hours of Sleep: ( ) Vegetative Symptoms: None  ADLScreening [facility] Assessment Services) Patient's cognitive ability adequate to safely complete daily activities?: Yes Patient able to express need for assistance with ADLs?: Yes Independently performs ADLs?: Yes (appropriate for developmental age)  Prior Inpatient Therapy Prior Inpatient Therapy: Yes Prior Therapy Dates: unknown Prior Therapy  Facilty/Provider(s): unknown Reason for Treatment: IDD and Depression   Prior Outpatient Therapy Prior Outpatient Therapy: Yes Prior Therapy Dates: current Prior Therapy Facilty/Provider(s): [facility] Reason for Treatment: depression Does patient have an ACCT team?: No Does patient have Intensive In-House Services?  : No Does patient have [facility] services? : No Does patient have P4CC services?: No  ADL Screening (condition at time of admission) Patient's cognitive ability adequate to safely complete daily activities?: Yes Patient able to express need for assistance with ADLs?: Yes Independently performs ADLs?: Yes (appropriate for developmental age)    Merchant navy officer (For Healthcare) Does Patient Have a Medical Advance Directive?: No Would patient like information on creating a medical advance directive?: No - Patient declined    Disposition:  Disposition Initial Assessment Completed for this Encounter: Yes  Nira Conn, NP, recommends overnight observation for safety and stabilization with psych consult in the a.m. Victorino Dike, RN, informed of disposition.  On Site Evaluation by:   Reviewed with Physician:    Burnetta Sabin, [facility] 05/28/2018 12:07 AM

## 2018-05-28 NOTE — ED Notes (Signed)
Pt alert and cooperative. Pt c/o si and hi. Pt contracts to safety. Pt denies any pain or avh at this time. Pt noted with slurred speech, pt reports this is her baseline. Pt safe, will continue to monitor.

## 2018-05-28 NOTE — ED Notes (Addendum)
Staff unable to obtain urine sample due to incontinence.  Patient requires high level of assistance for bathing.

## 2018-05-28 NOTE — ED Notes (Signed)
Patient incontinent of urine in her bed for the second time today.  She tells tech that she needs help cleaning herself after a bowel movement, but is capable of doing this herself.

## 2018-05-28 NOTE — ED Notes (Signed)
Bed: WBH43 Expected date:  Expected time:  Means of arrival:  Comments: 

## 2018-05-28 NOTE — ED Notes (Signed)
Nira Conn, NP, recommends overnight observation for safety and stabilization with psych consult in the a.m. Victorino Dike, RN, informed of disposition.

## 2018-05-28 NOTE — ED Notes (Signed)
Pt wet the bed for the second time this shift. Staff informed pt that she will be giving her things so she

## 2018-05-28 NOTE — ED Notes (Signed)
Patient remains asleep.  Breakfast not eaten.

## 2018-05-28 NOTE — ED Notes (Signed)
Pt had an episode of incontinence.

## 2018-05-28 NOTE — BH Assessment (Signed)
[facility] Assessment Progress Note  This pt has been diagnosed with IDD.  This writer looked through pt's chart, and I was unable to find psychometric testing to establish an IQ, without which pt cannot be placed.  At 16:16 I called pt's outpatient provider, the [name], at [phone].  They verify that pt is her own guardian.  Pt reportedly has an IDD care coordinator, [name], with [employer].  At 16:25 I called her at [phone].  Call rolled to voice mail, and I left a message.  Return call is pending as of this writing.  Doylene Canning, [place] [facility] Coordinator [phone]

## 2018-05-28 NOTE — ED Notes (Signed)
Patient advised that a urine sample is needed 

## 2018-05-28 NOTE — ED Notes (Signed)
Bed: WBH41 Expected date:  Expected time:  Means of arrival:  Comments: 

## 2018-05-28 NOTE — ED Notes (Signed)
Unable to arouse her for her to take her scheduled medication tonight. Her medication was Depakote. Sound sleeper.

## 2018-05-29 ENCOUNTER — Emergency Department (HOSPITAL_COMMUNITY)
Admission: EM | Admit: 2018-05-29 | Discharge: 2018-05-30 | Disposition: A | Discharge status: Home / Self Care | Payer: Medicare Other | Attending: Emergency Medicine | Admitting: Emergency Medicine

## 2018-05-29 ENCOUNTER — Encounter (HOSPITAL_COMMUNITY): Payer: Self-pay | Admitting: *Deleted

## 2018-05-29 ENCOUNTER — Emergency Department (HOSPITAL_COMMUNITY): Payer: Medicare Other

## 2018-05-29 ENCOUNTER — Other Ambulatory Visit: Payer: Self-pay

## 2018-05-29 DIAGNOSIS — F419 Anxiety disorder, unspecified: Secondary | ICD-10-CM | POA: Insufficient documentation

## 2018-05-29 DIAGNOSIS — F333 Major depressive disorder, recurrent, severe with psychotic symptoms: Secondary | ICD-10-CM | POA: Diagnosis not present

## 2018-05-29 DIAGNOSIS — Z87891 Personal history of nicotine dependence: Secondary | ICD-10-CM | POA: Diagnosis not present

## 2018-05-29 DIAGNOSIS — Z79899 Other long term (current) drug therapy: Secondary | ICD-10-CM | POA: Insufficient documentation

## 2018-05-29 DIAGNOSIS — R45850 Homicidal ideations: Secondary | ICD-10-CM | POA: Diagnosis not present

## 2018-05-29 DIAGNOSIS — Z7982 Long term (current) use of aspirin: Secondary | ICD-10-CM | POA: Diagnosis not present

## 2018-05-29 DIAGNOSIS — J449 Chronic obstructive pulmonary disease, unspecified: Secondary | ICD-10-CM | POA: Insufficient documentation

## 2018-05-29 DIAGNOSIS — F332 Major depressive disorder, recurrent severe without psychotic features: Secondary | ICD-10-CM | POA: Insufficient documentation

## 2018-05-29 DIAGNOSIS — R440 Auditory hallucinations: Secondary | ICD-10-CM | POA: Diagnosis not present

## 2018-05-29 DIAGNOSIS — F4325 Adjustment disorder with mixed disturbance of emotions and conduct: Secondary | ICD-10-CM | POA: Diagnosis not present

## 2018-05-29 DIAGNOSIS — R0602 Shortness of breath: Secondary | ICD-10-CM | POA: Diagnosis not present

## 2018-05-29 DIAGNOSIS — F70 Mild intellectual disabilities: Secondary | ICD-10-CM | POA: Diagnosis not present

## 2018-05-29 DIAGNOSIS — F99 Mental disorder, not otherwise specified: Secondary | ICD-10-CM | POA: Diagnosis present

## 2018-05-29 DIAGNOSIS — F259 Schizoaffective disorder, unspecified: Secondary | ICD-10-CM | POA: Insufficient documentation

## 2018-05-29 DIAGNOSIS — R079 Chest pain, unspecified: Secondary | ICD-10-CM | POA: Diagnosis not present

## 2018-05-29 DIAGNOSIS — R45851 Suicidal ideations: Secondary | ICD-10-CM | POA: Diagnosis not present

## 2018-05-29 DIAGNOSIS — R443 Hallucinations, unspecified: Secondary | ICD-10-CM

## 2018-05-29 DIAGNOSIS — Z9049 Acquired absence of other specified parts of digestive tract: Secondary | ICD-10-CM | POA: Insufficient documentation

## 2018-05-29 DIAGNOSIS — G809 Cerebral palsy, unspecified: Secondary | ICD-10-CM | POA: Insufficient documentation

## 2018-05-29 DIAGNOSIS — J45909 Unspecified asthma, uncomplicated: Secondary | ICD-10-CM | POA: Diagnosis not present

## 2018-05-29 LAB — COMPREHENSIVE METABOLIC PANEL
ALT: 17 U/L (ref 0–44)
AST: 21 U/L (ref 15–41)
Albumin: 3.9 g/dL (ref 3.5–5.0)
Alkaline Phosphatase: 34 U/L — ABNORMAL LOW (ref 38–126)
Anion gap: 12 (ref 5–15)
BUN: 21 mg/dL — ABNORMAL HIGH (ref 6–20)
CO2: 26 mmol/L (ref 22–32)
Calcium: 9.8 mg/dL (ref 8.9–10.3)
Chloride: 103 mmol/L (ref 98–111)
Creatinine, Ser: 0.72 mg/dL (ref 0.44–1.00)
GFR calc Af Amer: >60 mL/min (ref >60)
Glucose, Bld: 121 mg/dL — ABNORMAL HIGH (ref 70–99)
Potassium: 3.6 mmol/L (ref 3.5–5.1)
Sodium: 141 mmol/L (ref 135–145)
Total Bilirubin: 0.2 mg/dL — ABNORMAL LOW (ref 0.3–1.2)
Total Protein: 7 g/dL (ref 6.5–8.1)

## 2018-05-29 LAB — RAPID URINE DRUG SCREEN, HOSP PERFORMED
Amphetamines: NOT DETECTED
Amphetamines: NOT DETECTED
BARBITURATES: NOT DETECTED
Barbiturates: NOT DETECTED
Benzodiazepines: NOT DETECTED
Benzodiazepines: NOT DETECTED
Cocaine: NOT DETECTED
Cocaine: NOT DETECTED
Opiates: NOT DETECTED
Opiates: NOT DETECTED
TETRAHYDROCANNABINOL: NOT DETECTED
Tetrahydrocannabinol: NOT DETECTED

## 2018-05-29 LAB — CBC
HCT: 41.7 % (ref 36.0–46.0)
Hemoglobin: 13.5 g/dL (ref 12.0–15.0)
MCH: 32.1 pg (ref 26.0–34.0)
MCHC: 32.4 g/dL (ref 30.0–36.0)
MCV: 99 fL (ref 80.0–100.0)
Platelets: 290 10*3/uL (ref 150–400)
RBC: 4.21 MIL/uL (ref 3.87–5.11)
RDW: 12.8 % (ref 11.5–15.5)
WBC: 7.3 10*3/uL (ref 4.0–10.5)
nRBC: 0 % (ref 0.0–0.2)

## 2018-05-29 LAB — ACETAMINOPHEN LEVEL: Acetaminophen (Tylenol), Serum: <10 ug/mL — ABNORMAL LOW (ref 10–30)

## 2018-05-29 LAB — I-STAT BETA HCG BLOOD, ED (MC, WL, AP ONLY): HCG, QUANTITATIVE: 6.8 m[IU]/mL — AB (ref <5)

## 2018-05-29 LAB — VALPROIC ACID LEVEL: Valproic Acid Lvl: 49 ug/mL — ABNORMAL LOW (ref 50.0–100.0)

## 2018-05-29 LAB — SALICYLATE LEVEL: Salicylate Lvl: <7 mg/dL (ref 2.8–30.0)

## 2018-05-29 LAB — ETHANOL: Alcohol, Ethyl (B): <10 mg/dL (ref <10)

## 2018-05-29 LAB — TROPONIN I: Troponin I: <0.03 ng/mL (ref <0.03)

## 2018-05-29 MED ORDER — LEVOTHYROXINE SODIUM 50 MCG PO TABS
50.0000 ug | ORAL_TABLET | Freq: Every day | ORAL | Status: DC
  Administered 2018-05-30: 50 ug via ORAL
  Filled 2018-05-29: qty 1

## 2018-05-29 MED ORDER — FOLIC ACID 1 MG PO TABS
1.0000 mg | ORAL_TABLET | Freq: Every day | ORAL | Status: DC
  Administered 2018-05-30: 1 mg via ORAL
  Filled 2018-05-29: qty 1

## 2018-05-29 MED ORDER — ESCITALOPRAM OXALATE 10 MG PO TABS
20.0000 mg | ORAL_TABLET | Freq: Every day | ORAL | Status: DC
  Administered 2018-05-30: 20 mg via ORAL
  Filled 2018-05-29: qty 2

## 2018-05-29 MED ORDER — OXYBUTYNIN CHLORIDE ER 5 MG PO TB24
5.0000 mg | ORAL_TABLET | Freq: Every day | ORAL | Status: DC
  Administered 2018-05-30: 5 mg via ORAL
  Filled 2018-05-29: qty 1

## 2018-05-29 MED ORDER — LORATADINE 10 MG PO TABS
10.0000 mg | ORAL_TABLET | Freq: Every day | ORAL | Status: DC
  Administered 2018-05-30: 10 mg via ORAL
  Filled 2018-05-29: qty 1

## 2018-05-29 MED ORDER — FENOFIBRATE 160 MG PO TABS
160.0000 mg | ORAL_TABLET | Freq: Every day | ORAL | Status: DC
  Administered 2018-05-30: 160 mg via ORAL
  Filled 2018-05-29 (×2): qty 1

## 2018-05-29 MED ORDER — HYDROXYZINE HCL 25 MG PO TABS
25.0000 mg | ORAL_TABLET | Freq: Four times a day (QID) | ORAL | Status: DC | PRN
  Administered 2018-05-30: 25 mg via ORAL
  Filled 2018-05-29: qty 1

## 2018-05-29 MED ORDER — SUCRALFATE 1 G PO TABS
1.0000 g | ORAL_TABLET | Freq: Three times a day (TID) | ORAL | Status: DC
  Administered 2018-05-30 (×2): 1 g via ORAL
  Filled 2018-05-29 (×2): qty 1

## 2018-05-29 MED ORDER — ASPIRIN EC 81 MG PO TBEC
81.0000 mg | DELAYED_RELEASE_TABLET | Freq: Every day | ORAL | Status: DC
  Administered 2018-05-30: 81 mg via ORAL
  Filled 2018-05-29: qty 1

## 2018-05-29 MED ORDER — VITAMIN D 25 MCG (1000 UNIT) PO TABS
5000.0000 [IU] | ORAL_TABLET | Freq: Every day | ORAL | Status: DC
  Administered 2018-05-30: 5000 [IU] via ORAL
  Filled 2018-05-29: qty 5

## 2018-05-29 MED ORDER — DIVALPROEX SODIUM 250 MG PO DR TAB
500.0000 mg | DELAYED_RELEASE_TABLET | Freq: Two times a day (BID) | ORAL | Status: DC
  Administered 2018-05-30 (×2): 500 mg via ORAL
  Filled 2018-05-29 (×2): qty 2

## 2018-05-29 MED ORDER — RISPERIDONE 3 MG PO TABS
3.0000 mg | ORAL_TABLET | Freq: Every day | ORAL | Status: DC
  Administered 2018-05-30: 3 mg via ORAL
  Filled 2018-05-29: qty 1

## 2018-05-29 MED ORDER — PANTOPRAZOLE SODIUM 40 MG PO TBEC
40.0000 mg | DELAYED_RELEASE_TABLET | Freq: Every day | ORAL | Status: DC
  Administered 2018-05-30: 40 mg via ORAL
  Filled 2018-05-29: qty 1

## 2018-05-29 NOTE — ED Notes (Signed)
Pt located in [address]

## 2018-05-29 NOTE — Discharge Instructions (Signed)
For your behavioral health needs, you are advised to continue treatment with the [facility] Support Team:       [facility] Support Team      [facility], [address]      [address]      [place], [place]  [address]      [phone]      Crisis number: [phone]

## 2018-05-29 NOTE — ED Provider Notes (Addendum)
[facility] EMERGENCY DEPARTMENT Provider Note   CSN: [id number] Arrival date & time: 05/29/18  1738     History   Chief Complaint Chief Complaint  Patient presents with  . Suicidal  . Homicidal    HPI [name] is a 53 y.o. female with history of cerebral palsy, schizoaffective disorder, GERD, COPD, borderline personality disorder, anxiety who presents with suicidal, homicidal ideations and visual and auditory hallucinations.  Patient has been evaluated for this problem in the past week and was sent home.  She continues to have these symptoms and states that no one is helping her.  She lives with her sister-in-[name].  She is homicidal toward her, but all of her family.  She also wants to kill herself.  She reports she sees gnats flying around as well as voices in her head telling her to kill herself and her family.  She denies any drug or alcohol use.  She reports ongoing chest pain and shortness of breath that has been present for several years as well as nausea which is chronic.  She denies any new symptoms.  HPI  Past Medical History:  Diagnosis Date  . Anxiety   . Asthma   . Borderline personality disorder (HCC)   . Cerebral palsy (HCC)   . COPD (chronic obstructive pulmonary disease) (HCC)   . Depression   . GERD (gastroesophageal reflux disease)   . Mild cognitive impairment   . Schizoaffective disorder (HCC)   . Wound abscess     Patient Active Problem List   Diagnosis Date Noted  . 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 Overdose by acetaminophen 08/08/2016  . Schizoaffective disorder, bipolar type (HCC)   . 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 GERD (gastroesophageal reflux disease) 09/24/2014  . COPD (chronic obstructive pulmonary disease) (HCC) 09/24/2014  . Borderline personality disorder (HCC) 09/24/2014  . Cerebral palsy (HCC) 08/31/2014    Past Surgical History:  Procedure Laterality  Date  . CHOLECYSTECTOMY    . INCISION AND DRAINAGE ABSCESS Right 01/02/2015   Procedure: INCISION AND DRAINAGE ABSCESS;  Surgeon: Tiney Rouge III, MD;  Location: [facility] ORS;  Service: General;  Laterality: Right;  . TONSILLECTOMY       OB History    Gravida  0   Para  0   Term  0   Preterm  0   AB  0   Living  0     SAB  0   TAB  0   Ectopic  0   Multiple  0   Live Births               Home Medications    Prior to Admission medications   Medication Sig Start Date End Date Taking? Authorizing Provider  aspirin EC 81 MG tablet Take 1 tablet (81 mg total) by mouth daily. 12/14/17  Yes Clapacs, Jackquline Denmark, MD  cetirizine (ZYRTEC) 10 MG tablet Take 1 tablet (10 mg total) by mouth daily. Patient taking differently: Take 10 mg by mouth daily as needed for allergies.  12/14/17  Yes Clapacs, Jackquline Denmark, MD  Cholecalciferol (VITAMIN D3) 125 MCG (5000 UT) TABS Take 1 tablet by mouth daily.   Yes [provider]  clonazePAM (KLONOPIN) 0.5 MG tablet Take 1 tablet (0.5 mg total) by mouth 2 (two) times daily. 12/14/17  Yes Clapacs, Jackquline Denmark, MD  divalproex (DEPAKOTE) 500 MG DR tablet Take 1 tablet (500 mg total) by mouth 2 (  two) times daily. 12/14/17  Yes Clapacs, Jackquline DenmarkJohn T, MD  escitalopram (LEXAPRO) 20 MG tablet Take 1 tablet (20 mg total) by mouth daily. 12/14/17  Yes Clapacs, Jackquline DenmarkJohn T, MD  fenofibrate 160 MG tablet Take 1 tablet (160 mg total) by mouth daily. 12/14/17  Yes Clapacs, Jackquline DenmarkJohn T, MD  folic acid (FOLVITE) 1 MG tablet Take 1 tablet (1 mg total) by mouth daily. 12/14/17  Yes Clapacs, Jackquline DenmarkJohn T, MD  hydrOXYzine (ATARAX/VISTARIL) 25 MG tablet Take 1 tablet (25 mg total) by mouth every 6 (six) hours as needed for anxiety. 05/11/18  Yes Fayrene Helperran, [place], PA-C  levothyroxine (SYNTHROID, LEVOTHROID) 50 MCG tablet Take 1 tablet (50 mcg total) by mouth daily before breakfast. 12/14/17  Yes Clapacs, Jackquline DenmarkJohn T, MD  oxybutynin (DITROPAN-XL) 5 MG 24 hr tablet Take 1 tablet (5 mg total) by mouth daily. 12/14/17   Yes Clapacs, Jackquline DenmarkJohn T, MD  pantoprazole (PROTONIX) 40 MG tablet Take 1 tablet (40 mg total) by mouth daily. 12/14/17  Yes Clapacs, Jackquline DenmarkJohn T, MD  risperiDONE (RISPERDAL) 3 MG tablet Take 1 tablet (3 mg total) by mouth 2 (two) times daily. Patient taking differently: Take 3 mg by mouth daily.  12/14/17  Yes Clapacs, Jackquline DenmarkJohn T, MD  sucralfate (CARAFATE) 1 g tablet Take 1 tablet (1 g total) by mouth 4 (four) times daily -  with meals and at bedtime. 12/14/17  Yes Clapacs, Jackquline DenmarkJohn T, MD    Family History Family History  Problem Relation Age of Onset  . Heart attack Father   . Diabetes Mother     Social History Social History   Tobacco Use  . Smoking status: Former Games developermoker  . Smokeless tobacco: Never Used  Substance Use Topics  . Alcohol use: No  . Drug use: No     Allergies   Penicillins   Review of Systems Review of Systems  Constitutional: Negative for chills and fever.  HENT: Negative for facial swelling and sore throat.   Respiratory: Positive for shortness of breath (chronic).   Cardiovascular: Positive for chest pain (chronic).  Gastrointestinal: Negative for abdominal pain, nausea and vomiting.  Genitourinary: Negative for dysuria.  Musculoskeletal: Negative for back pain.  Skin: Negative for rash and wound.  Neurological: Negative for headaches.  Psychiatric/Behavioral: Positive for hallucinations and suicidal ideas. The patient is not nervous/anxious.      Physical Exam Updated Vital Signs BP 118/69 (BP Location: Right Arm)   Pulse 71   Temp (!) 97.5 F (36.4 C) (Oral)   Resp 18   LMP 05/28/2018   SpO2 97%   Physical Exam Vitals signs and nursing note reviewed.  Constitutional:      General: She is not in acute distress.    Appearance: She is well-developed. She is not diaphoretic.  HENT:     Head: Normocephalic and atraumatic.     Mouth/Throat:     Pharynx: No oropharyngeal exudate.  Eyes:     General: No scleral icterus.       Right eye: No discharge.         Left eye: No discharge.     Conjunctiva/sclera: Conjunctivae normal.     Pupils: Pupils are equal, round, and reactive to light.  Neck:     Musculoskeletal: Normal range of motion and neck supple.     Thyroid: No thyromegaly.  Cardiovascular:     Rate and Rhythm: Normal rate and regular rhythm.     Heart sounds: Normal heart sounds. No murmur. No friction rub. No gallop.  Pulmonary:     Effort: Pulmonary effort is normal. No respiratory distress.     Breath sounds: Normal breath sounds. No stridor. No wheezing or rales.  Abdominal:     General: Bowel sounds are normal. There is no distension.     Palpations: Abdomen is soft.     Tenderness: There is no abdominal tenderness. There is no guarding or rebound.  Lymphadenopathy:     Cervical: No cervical adenopathy.  Skin:    General: Skin is warm and dry.     Coloration: Skin is not pale.     Findings: No rash.  Neurological:     Mental Status: She is alert.     Coordination: Coordination normal.  Psychiatric:        Attention and Perception: She perceives auditory and visual hallucinations.        Thought Content: Thought content includes homicidal and suicidal ideation.      ED Treatments / Results  Labs (all labs ordered are listed, but only abnormal results are displayed) Labs Reviewed  COMPREHENSIVE METABOLIC PANEL - Abnormal; Notable for the following components:      Result Value   Glucose, Bld 121 (*)    BUN 21 (*)    Alkaline Phosphatase 34 (*)    Total Bilirubin 0.2 (*)    All other components within normal limits  ACETAMINOPHEN LEVEL - Abnormal; Notable for the following components:   Acetaminophen (Tylenol), Serum <10 (*)    All other components within normal limits  I-STAT BETA HCG BLOOD, ED ([facility], [facility], [facility] ONLY) - Abnormal; Notable for the following components:   I-stat hCG, quantitative 6.8 (*)    All other components within normal limits  ETHANOL  SALICYLATE LEVEL  CBC  RAPID URINE DRUG SCREEN, HOSP  PERFORMED  TROPONIN I  TROPONIN I  VALPROIC ACID LEVEL  D-DIMER, QUANTITATIVE (NOT AT [facility])    EKG None  Radiology Dg Chest 2 View  Result Date: 05/29/2018 CLINICAL DATA:  Chronic shortness of breath and chest pain for 1 day, initial encounter EXAM: CHEST - 2 VIEW COMPARISON:  05/02/17 FINDINGS: Cardiac shadows within normal limits. The lungs are well aerated bilaterally. No focal infiltrate or sizable effusion is seen. No acute bony abnormality is noted. IMPRESSION: No active cardiopulmonary disease. Electronically Signed   By: Alcide Clever M.D.   On: 05/29/2018 22:37    Procedures Procedures (including critical care time)  Medications Ordered in ED Medications  aspirin EC tablet 81 mg (has no administration in time range)  loratadine (CLARITIN) tablet 10 mg (has no administration in time range)  Vitamin D3 TABS 5,000 Units (has no administration in time range)  divalproex (DEPAKOTE) DR tablet 500 mg (has no administration in time range)  escitalopram (LEXAPRO) tablet 20 mg (has no administration in time range)  fenofibrate tablet 160 mg (has no administration in time range)  folic acid (FOLVITE) tablet 1 mg (has no administration in time range)  hydrOXYzine (ATARAX/VISTARIL) tablet 25 mg (has no administration in time range)  levothyroxine (SYNTHROID, LEVOTHROID) tablet 50 mcg (has no administration in time range)  oxybutynin (DITROPAN-XL) 24 hr tablet 5 mg (has no administration in time range)  pantoprazole (PROTONIX) EC tablet 40 mg (has no administration in time range)  risperiDONE (RISPERDAL) tablet 3 mg (has no administration in time range)  sucralfate (CARAFATE) tablet 1 g (has no administration in time range)     Initial Impression / Assessment and Plan / ED Course  I have reviewed the  triage vital signs and the nursing notes.  Pertinent labs & imaging results that were available during my care of the patient were reviewed by me and considered in my medical decision  making (see chart for details).  Clinical Course as of May 30 2335  Wed May 29, 2018  2319 Patient recommended for inpatient psychiatric treatment.  TTS to seek placement.  Patient reporting worsening shortness of breath and chest pain.  It is unclear if this is chronic or not as patient is very inconsistent.  She initially told me that it was going on for a while and then said it started today.  Considering she has been immobilized for the past couple days and is complaining of left leg pain that is been going on for a while as well, will d-dimer.  If negative, patient will be medically cleared pending therapeutic valproic acid level.   [AL]    Clinical Course User Index [AL] Emi HolesLaw, Huie Ghuman M, PA-C    Patient reporting suicidal, homicidal ideations as well as AVH.  She is also reporting chest pain and shortness of breath.  She is inconsistent whether this is her chronic symptoms or not.  Patient has been evaluated for chest pain in the past, however she is reporting shortness of breath and palpitations.  Per chart review, patient's several chest pain evaluations did not mention anything about chronic shortness of breath or palpitations.  She is reporting shortness of breath with walking.  Considering patient's recent immobilization and pleuritic pain, d-dimer ordered as above. At shift change, patient care transferred to Elpidio AnisShari Upstill, PA-C  for continued evaluation, follow up of d-dimer, valproic acid level. I discussed patient case with Dr. Erma HeritageIsaacs who guided the patient's management and agrees with plan.   Final Clinical Impressions(s) / ED Diagnoses   Final diagnoses:  Suicidal ideations  Homicidal ideations  Chest pain, unspecified type  Shortness of breath  Hallucinations    ED Discharge Orders    None          Emi HolesLaw, Aurianna Earlywine M, PA-C 05/30/18 0006    Shaune PollackIsaacs, [place], MD 05/30/18 1125

## 2018-05-29 NOTE — ED Notes (Signed)
Pt told her brother " fuck off" and has decided she doesn't want to go home with family, she wants to go to a shelter instead.  Pt asked that we not call her PSI worker because she wants to go to a shelter. Pt has made several phone calls, and appears agitated with family. She hangs up on them and then they call her back.

## 2018-05-29 NOTE — ED Provider Notes (Signed)
Patient admitted to psychiatry for SI/HI/AVH.   Now complaining of chest pain, initially saying for months, now saying it started today.  Pending delta troponin (running now) and d-dimer (needs to be collected).   Plan: review pending labs. If negative, she is cleared for placement for psychiatric issues.  The patient's tests (troponin x 2, d-dimer) are negative in evaluation of chest pain. She can be medically cleared as planned and placement is being sought.     Elpidio Anis, PA-C 05/30/18 Bernadene Person, MD 05/30/18 1125

## 2018-05-29 NOTE — ED Notes (Signed)
Pt repeatedly comes to the nurses station with concerns of leaving. Changing her mind where she wants to go, home, shelter, home , shelter... Attempting to redirect patient with poor results.

## 2018-05-29 NOTE — BH Assessment (Addendum)
Assessment Note  [name] is an 53 y.o. female.  The pt came in and stated she is "suicidal, homicidal, manic, and paranoid".  The pt stated she wants to kill her family by stabbing them and then kill herself with "the sharpest knife I can find".  The pt stated she is upset with her mother , because her mother called her a "silly bitch".  The pt stated she is paranoid of her sister in law and thinks her sister in law is doing things to her.  The pt reports she had a suicide attempt about 3 years ago when she took about 12 pills. The pt doesn't have a history of being violent.  She is with [facility].  She has been to the ED 11 times in the past 6 months.  She was discharged from the emergency room 05/28/2018.  The pt lives with her mother, sister in law, brother and nephew.  She stated everyone yells at her.  She denies a history of self harm, legal issues and abuse.  She stated she sees things flying around and hears voices telling her to kill her family.  The pt stated she sleeps well, when not with her family and has a poor appetite.  She denies SA.  Pt is dressed in scrubs. She is alert and oriented x4. Pt speaks in a clear tone, at moderate volume and normal pace. Eye contact is good. Pt's mood is depressed. Thought process is coherent and relevant. There is no indication Pt is currently responding to internal stimuli or experiencing delusional thought content.?Pt was cooperative throughout assessment.    Diagnosis:  F33.2 Major depressive disorder, Recurrent episode, Severe  Past Medical History:  Past Medical History:  Diagnosis Date  . Anxiety   . Asthma   . Borderline personality disorder (HCC)   . Cerebral palsy (HCC)   . COPD (chronic obstructive pulmonary disease) (HCC)   . Depression   . GERD (gastroesophageal reflux disease)   . Mild cognitive impairment   . Schizoaffective disorder (HCC)   . Wound abscess     Past Surgical History:  Procedure Laterality Date  .  CHOLECYSTECTOMY    . INCISION AND DRAINAGE ABSCESS Right 01/02/2015   Procedure: INCISION AND DRAINAGE ABSCESS;  Surgeon: Tiney Rouge III, MD;  Location: [facility] ORS;  Service: General;  Laterality: Right;  . TONSILLECTOMY      Family History:  Family History  Problem Relation Age of Onset  . Heart attack Father   . Diabetes Mother     Social History:  reports that she has quit smoking. She has never used smokeless tobacco. She reports that she does not drink alcohol or use drugs.  Additional Social History:  Alcohol / Drug Use Pain Medications: See MAR Prescriptions: See MAR Over the Counter: See MAR History of alcohol / drug use?: No history of alcohol / drug abuse Longest period of sobriety (when/how long): NA  CIWA: CIWA-Ar BP: (!) 123/96 Pulse Rate: 90 COWS:    Allergies:  Allergies  Allergen Reactions  . Penicillins Nausea And Vomiting and Other (See Comments)    Has patient had a PCN reaction causing immediate rash, facial/tongue/throat swelling, SOB or lightheadedness with hypotension: No Has patient had a PCN reaction causing severe rash involving mucus membranes or skin necrosis: No Has patient had a PCN reaction that required hospitalization No Has patient had a PCN reaction occurring within the last 10 years: No If all of the above answers are "NO", then  may proceed with Cephalosporin use      Home Medications: (Not in a hospital admission)   OB/GYN Status:  No LMP recorded. Patient is postmenopausal.  General Assessment Data Location of Assessment: [facility] ED TTS Assessment: In system Is this a Tele or Face-to-Face Assessment?: Face-to-Face Is this an Initial Assessment or a Re-assessment for this encounter?: Initial Assessment Patient Accompanied by:: N/A Language Other than English: No Living Arrangements: Other (Comment)(home) What gender do you identify as?: Female Marital status: Single Maiden name: Hammen Pregnancy Status: No Living Arrangements:  Parent, Other relatives, Non-relatives/Friends Can pt return to current living arrangement?: Yes Admission Status: Voluntary Is patient capable of signing voluntary admission?: Yes Referral Source: Self/Family/Friend Insurance type: Medicare     Crisis Care Plan Living Arrangements: Parent, Other relatives, Non-relatives/Friends Legal Guardian: Other:(Self) Name of Psychiatrist: [facility]  Name of Therapist: [facility]  Education Status Is patient currently in school?: No Is the patient employed, unemployed or receiving disability?: Receiving disability income  Risk to self with the past 6 months Suicidal Ideation: Yes-Currently Present Has patient been a risk to self within the past 6 months prior to admission? : Yes Suicidal Intent: Yes-Currently Present Has patient had any suicidal intent within the past 6 months prior to admission? : Yes Is patient at risk for suicide?: No Suicidal Plan?: Yes-Currently Present Has patient had any suicidal plan within the past 6 months prior to admission? : Yes Specify Current Suicidal Plan: stab self "with sharpest knife" Access to Means: Yes Specify Access to Suicidal Means: can get a knife at home What has been your use of drugs/alcohol within the last 12 months?: none Previous Attempts/Gestures: Yes How many times?: 1 Other Self Harm Risks: none Triggers for Past Attempts: None known Intentional Self Injurious Behavior: None Family Suicide History: Unknown Recent stressful life event(s): Conflict (Comment)(arguments with family members) Persecutory voices/beliefs?: Yes Depression: Yes Depression Symptoms: Insomnia, Tearfulness, Loss of interest in usual pleasures, Feeling worthless/self pity Substance abuse history and/or treatment for substance abuse?: No Suicide prevention information given to non-admitted patients: Not applicable  Risk to Others within the past 6 months Homicidal Ideation: Yes-Currently Present Does patient have any  lifetime risk of violence toward others beyond the six months prior to admission? : No Thoughts of Harm to Others: Yes-Currently Present Comment - Thoughts of Harm to Others: to stab family members Current Homicidal Intent: Yes-Currently Present Current Homicidal Plan: Yes-Currently Present Describe Current Homicidal Plan: stab family members and then stab herself Access to Homicidal Means: Yes Describe Access to Homicidal Means: can get a knife Identified Victim: family members History of harm to others?: No Assessment of Violence: None Noted Does patient have access to weapons?: Yes (Comment)(knives) Criminal Charges Pending?: No Does patient have a court date: No Is patient on probation?: No  Psychosis Hallucinations: Auditory, Visual Delusions: None noted  Mental Status Report Appearance/Hygiene: In scrubs Eye Contact: Good Motor Activity: Freedom of movement, Other (Comment)(rocking) Speech: Logical/coherent Level of Consciousness: Alert Mood: Depressed Affect: Depressed, Anxious Anxiety Level: None Thought Processes: Coherent, Relevant Judgement: Impaired Orientation: Person, Place, Time, Situation Obsessive Compulsive Thoughts/Behaviors: None  Cognitive Functioning Concentration: Normal Memory: Recent Intact, Remote Intact Is patient IDD: Yes Level of Function: unknown Is IQ score available?: No Insight: Poor Impulse Control: Poor Appetite: Poor Have you had any weight changes? : No Change Sleep: Decreased Total Hours of Sleep: 4 Vegetative Symptoms: None  ADLScreening [facility] Assessment Services) Patient's cognitive ability adequate to safely complete daily activities?: Yes Patient able to  express need for assistance with ADLs?: Yes Independently performs ADLs?: Yes (appropriate for developmental age)  Prior Inpatient Therapy Prior Inpatient Therapy: Yes Prior Therapy Dates: multiple Prior Therapy Facilty/Provider(s): multiple Reason for Treatment: IDD  and Depression   Prior Outpatient Therapy Prior Outpatient Therapy: Yes Prior Therapy Dates: current Prior Therapy Facilty/Provider(s): [facility] Reason for Treatment: depression Does patient have an ACCT team?: No Does patient have Intensive In-House Services?  : No Does patient have [facility] services? : No Does patient have P4CC services?: No  ADL Screening (condition at time of admission) Patient's cognitive ability adequate to safely complete daily activities?: Yes Patient able to express need for assistance with ADLs?: Yes Independently performs ADLs?: Yes (appropriate for developmental age)       Abuse/Neglect Assessment (Assessment to be complete while patient is alone) Abuse/Neglect Assessment Can Be Completed: Yes Physical Abuse: Denies Verbal Abuse: Denies Sexual Abuse: Denies Exploitation of patient/patient's resources: Yes, present (Comment)(previous note states former group home is still billing the pt ) Self-Neglect: Denies Values / Beliefs Cultural Requests During Hospitalization: None Spiritual Requests During Hospitalization: None Consults Spiritual Care Consult Needed: No Social Work Consult Needed: No            Disposition: PA Donell SievertSpencer Simon recommends inpatient treatment.  RN and MD were made aware of the recommendation.    On Site Evaluation by:   Reviewed with Physician:    Ottis StainGarvin, Jennilee Demarco Jermaine 05/29/2018 8:47 PM

## 2018-05-29 NOTE — ED Notes (Signed)
Complained of heart racing. Vitals checked and BP=100/54 and P=61. Provided reassurance and support. She asked again if I had heard anything about her going to the hospital. She is homeless and afraid which is why she says she needs to go to the hospital. Complaints possibly related to anxiety.

## 2018-05-29 NOTE — ED Notes (Signed)
Patient transported to X-ray 

## 2018-05-29 NOTE — BH Assessment (Signed)
TTS assessment is complete.  Waiting for disposition from PA.

## 2018-05-29 NOTE — BH Assessment (Signed)
[facility] Assessment Progress Note  Per [name], DO, this pt does not require psychiatric hospitalization at this time.  Pt is to be discharged from [facility] with recommendation to continue treatment with the [facility] Community Support Team.  This has been included in pt's discharge instructions.  Archie Balboa, CSW agrees to facilitate pt's return to the community.  Pt's nurse, Toniann Fail, has been notified.  Doylene Canning, MA Triage Specialist [phone]

## 2018-05-29 NOTE — ED Notes (Signed)
Pt alert and oriented x3, sitting on side of her bed. Pt repeatedly says she is concerned she will be discharged because she has no place to live. She states she wants to go inpatient at [facility] because " I aint got no place to live". Pt sts " I'm feeling scared about not having a place to live".  Reassured patient, turned on her TV for diversion.  Pt repeatedly asks what time the physician will be in.

## 2018-05-29 NOTE — ED Notes (Signed)
Pt is upset Dr Sharma Covert wants to send her home. Pt sts " if I go home and they try to take my phone, I'm coming back here because I need a place to live". Pt was encouraged to talk it out with her family, and was told family is working on 2 facilities that will take her once her SSI check is not being held.

## 2018-05-29 NOTE — ED Notes (Signed)
TTS in process in [facility]; sitter is present-Monique,RN

## 2018-05-29 NOTE — Progress Notes (Signed)
Pt meets inpatient criteria per Donell SievertSpencer Simon, PA. Referral information has been sent to the following hospitals for review: [facility]  [facility]  [facility]  [facility]  [facility]  [facility]  [facility]  [facility]  [facility]   Disposition will continue to assist with inpatient placement needs.   Wells GuilesSarah Demarko Zeimet, LCSW, LCAS Disposition CSW [facility] [phone]

## 2018-05-29 NOTE — ED Triage Notes (Signed)
Pt reports SI and HI towards her family whom she lives with.  She states they don't want to help her.  She was seen at [facility] ED on the 10th for same and was discharged home.  She is calm and cooperative.  She was transported by [employer].

## 2018-05-29 NOTE — ED Notes (Signed)
Pt changed into purple scrubs; personal items in 1 belonging bag located at [employer] station. Not inventoried at this time.

## 2018-05-29 NOTE — ED Notes (Signed)
Up to use bathroom. Came to Clinical research associate after urinating to ask for a "hat" to use to collect a urine sample. Hat in her room for the next time. States there is water on the floor in bathroom prior to her going in there so her socks and pants are wet. Gave her new ones to put on and she did so herself. Back up a few minutes later with hat to give writer a urine sample.

## 2018-05-29 NOTE — ED Notes (Signed)
While she was up after using the BR and providing a urine sample she also took her HS Depakote which was due at 2200.

## 2018-05-29 NOTE — Consult Note (Addendum)
[facility] ED Discharge  05/29/2018 1:36 PM [name]  MRN:  [id number] Principal Problem: Adjustment disorder with mixed disturbance of emotions and conduct Discharge Diagnoses: Principal Problem:   Adjustment disorder with mixed disturbance of emotions and conduct Active Problems:   Mild intellectual disability   Borderline personality disorder (HCC)   Subjective: Patient reports that she is upset because her family tries to take her phone away and her phone is precious to her. She states that she wants her own place to live. Her CST team is working with her. She reports that her family upsets her. She denies any access to weapons. She reports that she slept good last night and has been eating good. She requests to stay in the hospital until she has a new place to live. She initially reports that she is suicidal and homicdal towards family, but after understanding that her CST team is coming and they are assisting her with a place to live, she denies SI and HI and states that she will discharge home with them and work it out with her family.   Objective: Patient's chart and findings reviewed and discussed with treatment team. Patient presents in her room and is focused on staying in the hospital until she finds a new place to live. She does have a CST team through [facility]. She becomes labile when talking about her family trying to take her phone. She is easily redirectable   Total Time spent with patient: 30 minutes  Past Psychiatric History: Mild ID, Borderline personality, Adjustment disorder, cerebral palsy, schizoaffective bipolar type  Past Medical History:  Past Medical History:  Diagnosis Date  . Anxiety   . Asthma   . Borderline personality disorder (HCC)   . Cerebral palsy (HCC)   . COPD (chronic obstructive pulmonary disease) (HCC)   . Depression   . GERD (gastroesophageal reflux disease)   . Mild cognitive impairment   . Schizoaffective disorder (HCC)   . Wound abscess      Past Surgical History:  Procedure Laterality Date  . CHOLECYSTECTOMY    . INCISION AND DRAINAGE ABSCESS Right 01/02/2015   Procedure: INCISION AND DRAINAGE ABSCESS;  Surgeon: Tiney Rouge III, MD;  Location: [facility] ORS;  Service: General;  Laterality: Right;  . TONSILLECTOMY     Family History:  Family History  Problem Relation Age of Onset  . Heart attack Father   . Diabetes Mother    Family Psychiatric  History: Denies Social History:  Social History   Substance and Sexual Activity  Alcohol Use No     Social History   Substance and Sexual Activity  Drug Use No    Social History   Socioeconomic History  . Marital status: Single    Spouse name: Not on file  . Number of children: Not on file  . Years of education: Not on file  . Highest education level: Not on file  Occupational History  . Not on file  Social Needs  . Financial resource strain: Not on file  . Food insecurity:    Worry: Not on file    Inability: Not on file  . Transportation needs:    Medical: Not on file    Non-medical: Not on file  Tobacco Use  . Smoking status: Former Games developer  . Smokeless tobacco: Never Used  Substance and Sexual Activity  . Alcohol use: No  . Drug use: No  . Sexual activity: Not on file  Lifestyle  . Physical activity:  Days per week: Not on file    Minutes per session: Not on file  . Stress: Not on file  Relationships  . Social connections:    Talks on phone: Not on file    Gets together: Not on file    Attends religious service: Not on file    Active member of club or organization: Not on file    Attends meetings of clubs or organizations: Not on file    Relationship status: Not on file  Other Topics Concern  . Not on file  Social History Narrative  . Not on file    Has this patient used any form of tobacco in the last 30 days? (Cigarettes, Smokeless Tobacco, Cigars, and/or Pipes) Denies  Current Medications: Current Facility-Administered Medications   Medication Dose Route Frequency Provider Last Rate Last Dose  . acetaminophen (TYLENOL) tablet 650 mg  650 mg Oral Q4H PRN Harris, Abigail, PA-C      . alum & mag hydroxide-simeth (MAALOX/MYLANTA) 200-200-20 MG/5ML suspension 30 mL  30 mL Oral Q6H PRN Arthor CaptainHarris, Abigail, PA-C      . aspirin chewable tablet 81 mg  81 mg Oral Daily Cherly Beachorman, Maeghan Canny J, DO   81 mg at 05/29/18 1116  . cholecalciferol (VITAMIN D3) tablet 125 mcg  125 mcg Oral Daily Cherly Beachorman, Jonathandavid Marlett J, DO   125 mcg at 05/29/18 1115  . clonazePAM (KLONOPIN) tablet 0.5 mg  0.5 mg Oral BID PRN Cherly BeachNorman, Kamilia Carollo J, DO      . divalproex (DEPAKOTE) DR tablet 500 mg  500 mg Oral Q12H Cherly Beachorman, Lorrene Graef J, DO   500 mg at 05/29/18 1115  . escitalopram (LEXAPRO) tablet 20 mg  20 mg Oral Daily Cherly Beachorman, Dajion Bickford J, DO   20 mg at 05/29/18 1115  . fenofibrate tablet 160 mg  160 mg Oral Daily Cherly Beachorman, Sadler Teschner J, DO   160 mg at 05/29/18 1116  . hydrOXYzine (ATARAX/VISTARIL) tablet 25 mg  25 mg Oral Q6H PRN Cherly BeachNorman, Aniko Finnigan J, DO      . risperiDONE (RISPERDAL) tablet 3 mg  3 mg Oral Daily Cherly Beachorman, Bailey Faiella J, DO   3 mg at 05/29/18 1115   Current Outpatient Medications  Medication Sig Dispense Refill  . aspirin EC 81 MG tablet Take 1 tablet (81 mg total) by mouth daily. 30 tablet 1  . cetirizine (ZYRTEC) 10 MG tablet Take 1 tablet (10 mg total) by mouth daily. (Patient taking differently: Take 10 mg by mouth daily as needed for allergies. ) 30 tablet 1  . Cholecalciferol (VITAMIN D3) 125 MCG (5000 UT) TABS Take 1 tablet by mouth daily.    . clonazePAM (KLONOPIN) 0.5 MG tablet Take 1 tablet (0.5 mg total) by mouth 2 (two) times daily. 60 tablet 1  . divalproex (DEPAKOTE) 500 MG DR tablet Take 1 tablet (500 mg total) by mouth 2 (two) times daily. 60 tablet 1  . escitalopram (LEXAPRO) 20 MG tablet Take 1 tablet (20 mg total) by mouth daily. 30 tablet 1  . fenofibrate 160 MG tablet Take 1 tablet (160 mg total) by mouth daily. 30 tablet 1  .  hydrOXYzine (ATARAX/VISTARIL) 25 MG tablet Take 1 tablet (25 mg total) by mouth every 6 (six) hours as needed for anxiety. 12 tablet 0  . pantoprazole (PROTONIX) 40 MG tablet Take 1 tablet (40 mg total) by mouth daily. 30 tablet 1  . risperiDONE (RISPERDAL) 3 MG tablet Take 1 tablet (3 mg total) by mouth 2 (two) times daily. (Patient taking differently: Take  3 mg by mouth daily. ) 60 tablet 1  . sucralfate (CARAFATE) 1 g tablet Take 1 tablet (1 g total) by mouth 4 (four) times daily -  with meals and at bedtime. 120 tablet 1  . Fluticasone-Salmeterol (ADVAIR) 250-50 MCG/DOSE AEPB Inhale 1 puff into the lungs 2 (two) times daily. (Patient not taking: Reported on 05/27/2018) 60 each 1  . folic acid (FOLVITE) 1 MG tablet Take 1 tablet (1 mg total) by mouth daily. (Patient not taking: Reported on 05/18/2018) 30 tablet 1  . levothyroxine (SYNTHROID, LEVOTHROID) 50 MCG tablet Take 1 tablet (50 mcg total) by mouth daily before breakfast. (Patient not taking: Reported on 05/18/2018) 30 tablet 1  . oxybutynin (DITROPAN-XL) 5 MG 24 hr tablet Take 1 tablet (5 mg total) by mouth daily. (Patient not taking: Reported on 05/27/2018) 30 tablet 1   PTA Medications: (Not in a hospital admission)   Musculoskeletal: Strength & Muscle Tone: within normal limits Gait & Station: normal Patient leans: N/A  Psychiatric Specialty Exam: Physical Exam  Nursing note and vitals reviewed. Constitutional: She is oriented to person, place, and time. She appears well-developed and well-nourished.  Cardiovascular: Normal rate.  Respiratory: Effort normal.  Musculoskeletal: Normal range of motion.  Neurological: She is alert and oriented to person, place, and time.  Skin: Skin is warm.  Psychiatric: Her speech is normal and behavior is normal. Judgment and thought content normal. Her mood appears anxious. Cognition and memory are impaired.    Review of Systems  Constitutional: Negative.   HENT: Negative.   Eyes: Negative.    Respiratory: Negative.   Cardiovascular: Negative.   Gastrointestinal: Negative.   Genitourinary: Negative.   Musculoskeletal: Negative.   Skin: Negative.   Neurological: Negative.   Endo/Heme/Allergies: Negative.   Psychiatric/Behavioral: Positive for depression. Negative for hallucinations, substance abuse and suicidal ideas.    Blood pressure (!) 122/58, pulse 66, temperature 98.5 F (36.9 C), temperature source Oral, resp. rate 14, height 5\' 4"  (1.626 m), weight 95.3 kg, SpO2 94 %.Body mass index is 36.05 kg/m.  General Appearance: Casual  Eye Contact:  Good  Speech:  Clear and Coherent and Normal Rate  Volume:  Normal  Mood:  Dysphoric  Affect:  Labile  Thought Process:  Linear and Descriptions of Associations: Circumstantial  Orientation:  Full (Time, Place, and Person)  Thought Content:  WDL  Suicidal Thoughts:  No  Homicidal Thoughts:  No  Memory:  Immediate;   Good Recent;   Good Remote;   Good  Judgement:  Impaired  Insight:  Lacking  Psychomotor Activity:  Normal  Concentration:  Concentration: Good and Attention Span: Fair  Recall:  Good  Fund of Knowledge:  Good  Language:  Good  Akathisia:  No  Handed:  Right  AIMS (if indicated):   N/A  Assets:  Financial Resources/Insurance Housing Resilience Social Support  ADL's:  Intact  Cognition:  WNL  Sleep:   N/A     Demographic Factors:  NA  Loss Factors: NA  Historical Factors: NA  Risk Reduction Factors:   Living with another person, especially a relative, Positive social support and Positive therapeutic relationship  Continued Clinical Symptoms:  Previous Psychiatric Diagnoses and Treatments  Cognitive Features That Contribute To Risk:  Closed-mindedness    Suicide Risk:  Minimal: No identifiable suicidal ideation.  Patients presenting with no risk factors but with morbid ruminations; may be classified as minimal risk based on the severity of the depressive symptoms    Plan Of  Care/Follow-up recommendations:  Activity:  Normal Diet:  regular, low fat diet   Disposition: Continue follow up with [facility]. Patinet will discharge with CST. Gerlene Burdock Money, FNP 05/29/2018, 1:36 PM   Patient seen face-to-face for psychiatric evaluation, chart reviewed and case discussed with the physician extender and developed treatment plan. Reviewed the information documented and agree with the treatment plan.  [name], DO 05/29/18 5:05 PM

## 2018-05-30 ENCOUNTER — Encounter (HOSPITAL_COMMUNITY): Payer: Self-pay | Admitting: Registered Nurse

## 2018-05-30 DIAGNOSIS — F332 Major depressive disorder, recurrent severe without psychotic features: Secondary | ICD-10-CM | POA: Diagnosis not present

## 2018-05-30 LAB — D-DIMER, QUANTITATIVE (NOT AT ARMC): D-Dimer, Quant: 0.32 ug/mL-FEU (ref 0.00–0.50)

## 2018-05-30 LAB — TROPONIN I: Troponin I: <0.03 ng/mL (ref <0.03)

## 2018-05-30 MED ORDER — HYDROXYZINE HCL 25 MG PO TABS: 25.0000 mg | ORAL_TABLET | Freq: Three times a day (TID) | ORAL | 0 refills | Status: DC | PRN

## 2018-05-30 MED ORDER — ACETAMINOPHEN 500 MG PO TABS
500.0000 mg | ORAL_TABLET | Freq: Once | ORAL | Status: AC
  Administered 2018-05-30: 500 mg via ORAL
  Filled 2018-05-30: qty 1

## 2018-05-30 NOTE — ED Notes (Signed)
Explained to pt that going home is the best option for her long-term care, pt is agreeable to go home with sister in law

## 2018-05-30 NOTE — ED Provider Notes (Signed)
Please see previous provider note for full H&P.  In summary, patient is a 53 year old female presenting with SI, HI, and auditory hallucinations. No acute events overnight. Patient is sitting up comfortable in bed while eating breakfast.   Vitals:   05/29/18 2126 05/30/18 0651  BP: 118/69 100/60  Pulse: 71 (!) 57  Resp: 18 15  Temp: (!) 97.5 F (36.4 C) 98.3 F (36.8 C)  SpO2: 97% 92%   [facility] recommends inpatient treatment. Placement is pending.    Carlyle Basques [place], [place] 05/30/18 6568    Jacalyn Lefevre, MD 05/30/18 [phone]
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
## 2018-05-30 NOTE — ED Notes (Signed)
Pt left in the care of sister, told to take care of herself, said "I will try to, it will be hard but I will try"
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
## 2018-05-30 NOTE — Discharge Instructions (Addendum)
Follow up with the resources you have been given. °
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
## 2018-05-30 NOTE — ED Notes (Signed)
Lunch meal given. 

## 2018-05-31 ENCOUNTER — Encounter (HOSPITAL_COMMUNITY): Payer: Self-pay

## 2018-05-31 ENCOUNTER — Emergency Department (HOSPITAL_COMMUNITY)
Admission: EM | Admit: 2018-05-31 | Discharge: 2018-06-01 | Disposition: A | Discharge status: Home / Self Care | Payer: Medicare Other | Attending: Emergency Medicine | Admitting: Emergency Medicine

## 2018-05-31 DIAGNOSIS — F329 Major depressive disorder, single episode, unspecified: Secondary | ICD-10-CM | POA: Diagnosis present

## 2018-05-31 DIAGNOSIS — R45851 Suicidal ideations: Secondary | ICD-10-CM | POA: Diagnosis not present

## 2018-05-31 DIAGNOSIS — Z79899 Other long term (current) drug therapy: Secondary | ICD-10-CM | POA: Insufficient documentation

## 2018-05-31 DIAGNOSIS — Z87891 Personal history of nicotine dependence: Secondary | ICD-10-CM | POA: Insufficient documentation

## 2018-05-31 DIAGNOSIS — Z7982 Long term (current) use of aspirin: Secondary | ICD-10-CM | POA: Insufficient documentation

## 2018-05-31 DIAGNOSIS — F333 Major depressive disorder, recurrent, severe with psychotic symptoms: Secondary | ICD-10-CM | POA: Insufficient documentation

## 2018-05-31 DIAGNOSIS — J449 Chronic obstructive pulmonary disease, unspecified: Secondary | ICD-10-CM | POA: Diagnosis not present

## 2018-05-31 LAB — COMPREHENSIVE METABOLIC PANEL
ALK PHOS: 33 U/L — AB (ref 38–126)
ALT: 12 U/L (ref 0–44)
AST: 18 U/L (ref 15–41)
Albumin: 3.6 g/dL (ref 3.5–5.0)
Anion gap: 10 (ref 5–15)
BILIRUBIN TOTAL: 0.3 mg/dL (ref 0.3–1.2)
BUN: 23 mg/dL — ABNORMAL HIGH (ref 6–20)
CO2: 24 mmol/L (ref 22–32)
Calcium: 9.3 mg/dL (ref 8.9–10.3)
Chloride: 108 mmol/L (ref 98–111)
Creatinine, Ser: 0.73 mg/dL (ref 0.44–1.00)
GFR calc Af Amer: >60 mL/min (ref >60)
GFR calc non Af Amer: >60 mL/min (ref >60)
Glucose, Bld: 110 mg/dL — ABNORMAL HIGH (ref 70–99)
Potassium: 4 mmol/L (ref 3.5–5.1)
Sodium: 142 mmol/L (ref 135–145)
Total Protein: 6.9 g/dL (ref 6.5–8.1)

## 2018-05-31 LAB — RAPID URINE DRUG SCREEN, HOSP PERFORMED
Amphetamines: NOT DETECTED
Barbiturates: NOT DETECTED
Benzodiazepines: NOT DETECTED
Cocaine: NOT DETECTED
Opiates: NOT DETECTED
Tetrahydrocannabinol: NOT DETECTED

## 2018-05-31 LAB — CBC
HCT: 39.8 % (ref 36.0–46.0)
Hemoglobin: 12.7 g/dL (ref 12.0–15.0)
MCH: 31.8 pg (ref 26.0–34.0)
MCHC: 31.9 g/dL (ref 30.0–36.0)
MCV: 99.5 fL (ref 80.0–100.0)
Platelets: 280 10*3/uL (ref 150–400)
RBC: 4 MIL/uL (ref 3.87–5.11)
RDW: 12.7 % (ref 11.5–15.5)
WBC: 6.7 10*3/uL (ref 4.0–10.5)
nRBC: 0 % (ref 0.0–0.2)

## 2018-05-31 LAB — I-STAT BETA HCG BLOOD, ED (MC, WL, AP ONLY): HCG, QUANTITATIVE: 6.4 m[IU]/mL — AB (ref <5)

## 2018-05-31 LAB — ETHANOL: Alcohol, Ethyl (B): <10 mg/dL (ref <10)

## 2018-05-31 LAB — ACETAMINOPHEN LEVEL: Acetaminophen (Tylenol), Serum: <10 ug/mL — ABNORMAL LOW (ref 10–30)

## 2018-05-31 LAB — SALICYLATE LEVEL: Salicylate Lvl: <7 mg/dL (ref 2.8–30.0)

## 2018-05-31 NOTE — ED Triage Notes (Signed)
Pt reports feeling SI and HI. States that she wants to stab herself, pt reports that she wants to hurt everyone that comes around her. Reports AVH, reports compliance with meds.

## 2018-05-31 NOTE — ED Notes (Signed)
Patient wearing maroon paper scrubs , personal belongings inventoried and stored at locker[facility] , plan of care explained to pt. Marland Kitchen

## 2018-06-01 ENCOUNTER — Emergency Department (HOSPITAL_COMMUNITY): Payer: Medicare Other

## 2018-06-01 DIAGNOSIS — F333 Major depressive disorder, recurrent, severe with psychotic symptoms: Secondary | ICD-10-CM | POA: Diagnosis not present
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
## 2018-06-01 NOTE — [facility] Counselor (Signed)
Clinician asked the pt if she could contact family/friend supports to obtain collateral information. Pt replied, "not call." Pt reported, her family knows she is at the hospital.    Redmond Pulling, MS, [facility], [facility] Triage Specialist [phone]

## 2018-06-01 NOTE — ED Notes (Signed)
TTS video interview in progress .  

## 2018-06-01 NOTE — Progress Notes (Signed)
CSW contacted [name] regarding discharge. She was informed that patient is being discharged. She stated that the patient could call her when she was leaving and she would pick her up but did not have plans to come into the hospital this time. CSW mentioned safety issues around patient plans to cut herself and saying that there were knifes in the home. No other concerns expressed. Contact ended without issue. Sherri Reining, RN notified regarding contact.   [name]. [name], MSW, LCSW Clinical Social Work/Disposition Phone: [phone] Fax: [phone]

## 2018-06-01 NOTE — ED Notes (Signed)
Breakfast ordered 

## 2018-06-01 NOTE — ED Provider Notes (Addendum)
[facility] EMERGENCY DEPARTMENT Provider Note   CSN: [id number] Arrival date & time: 05/31/18  2015     History   Chief Complaint Chief Complaint  Patient presents with  . Suicidal    HPI [name] is a 53 y.o. female.  Patient presents to the emergency department with a chief complaint of suicidal and homicidal ideation.  She states that she has had these symptoms before, but never felt as homicidal as she does now.  She states that she wants to stab people with a knife.  She denies wanting to stab anyone in particular.  Denies any auditory visual hallucinations.  Denies any alcohol or drug use.  Denies any other associated symptoms.  She states that she tripped and fell and landed on her knee prior to arrival.  The history is provided by the patient. No language interpreter was used.    Past Medical History:  Diagnosis Date  . Anxiety   . Asthma   . Borderline personality disorder (HCC)   . Cerebral palsy (HCC)   . COPD (chronic obstructive pulmonary disease) (HCC)   . Depression   . GERD (gastroesophageal reflux disease)   . Mild cognitive impairment   . Schizoaffective disorder (HCC)   . Wound abscess     Patient Active Problem List   Diagnosis Date Noted  . 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 Overdose by acetaminophen 08/08/2016  . Schizoaffective disorder, bipolar type (HCC)   . 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 GERD (gastroesophageal reflux disease) 09/24/2014  . COPD (chronic obstructive pulmonary disease) (HCC) 09/24/2014  . Borderline personality disorder (HCC) 09/24/2014  . Cerebral palsy (HCC) 08/31/2014    Past Surgical History:  Procedure Laterality Date  . CHOLECYSTECTOMY    . INCISION AND DRAINAGE ABSCESS Right 01/02/2015   Procedure: INCISION AND DRAINAGE ABSCESS;  Surgeon: Tiney Rouge III, MD;  Location: [facility] ORS;  Service: General;  Laterality: Right;  . TONSILLECTOMY        OB History    Gravida  0   Para  0   Term  0   Preterm  0   AB  0   Living  0     SAB  0   TAB  0   Ectopic  0   Multiple  0   Live Births               Home Medications    Prior to Admission medications   Medication Sig Start Date End Date Taking? Authorizing Provider  aspirin EC 81 MG tablet Take 1 tablet (81 mg total) by mouth daily. 12/14/17   Clapacs, Jackquline Denmark, MD  cetirizine (ZYRTEC) 10 MG tablet Take 1 tablet (10 mg total) by mouth daily. Patient taking differently: Take 10 mg by mouth daily as needed for allergies.  12/14/17   Clapacs, Jackquline Denmark, MD  Cholecalciferol (VITAMIN D3) 125 MCG (5000 UT) TABS Take 1 tablet by mouth daily.    [provider]  clonazePAM (KLONOPIN) 0.5 MG tablet Take 1 tablet (0.5 mg total) by mouth 2 (two) times daily. 12/14/17   Clapacs, Jackquline Denmark, MD  divalproex (DEPAKOTE) 500 MG DR tablet Take 1 tablet (500 mg total) by mouth 2 (two) times daily. 12/14/17   Clapacs, Jackquline Denmark, MD  escitalopram (LEXAPRO) 20 MG tablet Take 1 tablet (20 mg total) by mouth daily. 12/14/17   Clapacs, Jackquline Denmark, MD  fenofibrate 160 MG tablet Take 1  tablet (160 mg total) by mouth daily. 12/14/17   Clapacs, Jackquline Denmark, MD  folic acid (FOLVITE) 1 MG tablet Take 1 tablet (1 mg total) by mouth daily. 12/14/17   Clapacs, Jackquline Denmark, MD  hydrOXYzine (ATARAX/VISTARIL) 25 MG tablet Take 1 tablet (25 mg total) by mouth every 6 (six) hours as needed for anxiety. 05/11/18   Fayrene Helper, PA-C  hydrOXYzine (ATARAX/VISTARIL) 25 MG tablet Take 1 tablet (25 mg total) by mouth every 8 (eight) hours as needed for anxiety. 05/30/18   Benjiman Core, MD  levothyroxine (SYNTHROID, LEVOTHROID) 50 MCG tablet Take 1 tablet (50 mcg total) by mouth daily before breakfast. 12/14/17   Clapacs, Jackquline Denmark, MD  oxybutynin (DITROPAN-XL) 5 MG 24 hr tablet Take 1 tablet (5 mg total) by mouth daily. 12/14/17   Clapacs, Jackquline Denmark, MD  pantoprazole (PROTONIX) 40 MG tablet Take 1 tablet (40 mg total) by mouth  daily. 12/14/17   Clapacs, Jackquline Denmark, MD  risperiDONE (RISPERDAL) 3 MG tablet Take 1 tablet (3 mg total) by mouth 2 (two) times daily. Patient taking differently: Take 3 mg by mouth daily.  12/14/17   Clapacs, Jackquline Denmark, MD  sucralfate (CARAFATE) 1 g tablet Take 1 tablet (1 g total) by mouth 4 (four) times daily -  with meals and at bedtime. 12/14/17   Clapacs, Jackquline Denmark, MD    Family History Family History  Problem Relation Age of Onset  . Heart attack Father   . Diabetes Mother     Social History Social History   Tobacco Use  . Smoking status: Former Games developer  . Smokeless tobacco: Never Used  Substance Use Topics  . Alcohol use: No  . Drug use: No     Allergies   Penicillins   Review of Systems Review of Systems  All other systems reviewed and are negative.    Physical Exam Updated Vital Signs BP 126/72 (BP Location: Right Arm)   Pulse 66   Temp (!) 97.5 F (36.4 C)   Resp 16   LMP 05/28/2018   SpO2 100%   Physical Exam Vitals signs and nursing note reviewed.  Constitutional:      Appearance: She is well-developed.  HENT:     Head: Normocephalic and atraumatic.  Eyes:     Conjunctiva/sclera: Conjunctivae normal.     Pupils: Pupils are equal, round, and reactive to light.  Neck:     Musculoskeletal: Normal range of motion and neck supple.  Cardiovascular:     Rate and Rhythm: Normal rate and regular rhythm.     Heart sounds: No murmur. No friction rub. No gallop.   Pulmonary:     Effort: Pulmonary effort is normal. No respiratory distress.     Breath sounds: Normal breath sounds. No wheezing or rales.  Chest:     Chest wall: No tenderness.  Abdominal:     General: Bowel sounds are normal. There is no distension.     Palpations: Abdomen is soft. There is no mass.     Tenderness: There is no abdominal tenderness. There is no guarding or rebound.  Musculoskeletal: Normal range of motion.        General: No tenderness.  Skin:    General: Skin is warm and dry.    Neurological:     Mental Status: She is alert and oriented to person, place, and time.  Psychiatric:        Behavior: Behavior normal.        Thought Content: Thought content  normal.        Judgment: Judgment normal.      ED Treatments / Results  Labs (all labs ordered are listed, but only abnormal results are displayed) Labs Reviewed  COMPREHENSIVE METABOLIC PANEL - Abnormal; Notable for the following components:      Result Value   Glucose, Bld 110 (*)    BUN 23 (*)    Alkaline Phosphatase 33 (*)    All other components within normal limits  ACETAMINOPHEN LEVEL - Abnormal; Notable for the following components:   Acetaminophen (Tylenol), Serum <10 (*)    All other components within normal limits  I-STAT BETA HCG BLOOD, ED ([facility], [facility], [facility] ONLY) - Abnormal; Notable for the following components:   I-stat hCG, quantitative 6.4 (*)    All other components within normal limits  ETHANOL  SALICYLATE LEVEL  CBC  RAPID URINE DRUG SCREEN, HOSP PERFORMED    EKG None  Radiology Dg Knee Complete 4 Views Left  Result Date: 06/01/2018 CLINICAL DATA:  Status post fall with left knee pain. EXAM: LEFT KNEE - COMPLETE 4+ VIEW COMPARISON:  None. FINDINGS: No evidence of fracture, dislocation, or joint effusion. No evidence of arthropathy or other focal bone abnormality. Soft tissues are unremarkable. IMPRESSION: Negative. Electronically Signed   By: Sherian ReinWei-Chen  Lin M.D.   On: 06/01/2018 00:46    Procedures Procedures (including critical care time)  Medications Ordered in ED Medications - No data to display   Initial Impression / Assessment and Plan / ED Course  I have reviewed the triage vital signs and the nursing notes.  Pertinent labs & imaging results that were available during my care of the patient were reviewed by me and considered in my medical decision making (see chart for details).     Patient with extensive psychiatric medical history, presenting with increased thoughts of  suicide and homicide.  Patient medically screened and medically cleared for TTS evaluation.  Per TTS, plan is for overnight observation and reassessment in the morning.  Final Clinical Impressions(s) / ED Diagnoses   Final diagnoses:  Suicidal thoughts    ED Discharge Orders    None       Roxy HorsemanBrowning, Arynn Armand, PA-C 06/01/18 0253    Roxy HorsemanBrowning, Shallen Luedke, PA-C 06/01/18 0254    Zadie RhineWickline, Donald, MD 06/01/18 [phone]

## 2018-06-01 NOTE — Progress Notes (Signed)
Patient is seen by me via tele-psych and I have consulted with Dr. Lucianne Muss.  Patient reports that she is going to go home and cut her wrist with a knife that she has in her kitchen and they have a bunch of knives at home.  Patient has had 11 ED visits since February 16, 2018 with 4 of those being within the last week.  Patient reports that this time she came here because she is upset because she has been asking to go to a group home and she was told that she had to go to a facility in [place] for approximately 6 months and then she can go to the facility in [place] which is a new group home.  She states that she does not want to go to the place and [place] because the lady there is strict and she has to make her bed every day and she does not want to have those kinds of rules.  She states that she has [facility] but she did not contact them.  She is supposed to contact them for any type of crisis.  [facility] was contacted and they notified us that they have picked the patient up from the ED numerous times with the same complaints.  [facility] has no safety cooncerns with this patient and she can return home with her family.  They state that the patient's behavior has been this way for quite some time as she has been kicked out of 8 different placements due to the same behaviors of going back and forth to the emergency room. At this time patient does not meet inpatient criteria and is psychiatrically cleared. I have contacted Dr. Lockie Mola and notified him of the recommendations.

## 2018-06-01 NOTE — ED Notes (Signed)
Pt given discharge instructions and follow up information. Pt given the opportunity to ask questions. Pt verbalized understanding. All belongings returned to patient from locker number 4.

## 2018-06-03 ENCOUNTER — Emergency Department (HOSPITAL_COMMUNITY)
Admission: EM | Admit: 2018-06-03 | Discharge: 2018-06-03 | Disposition: A | Discharge status: Home / Self Care | Payer: Medicare Other | Attending: Emergency Medicine | Admitting: Emergency Medicine

## 2018-06-03 ENCOUNTER — Other Ambulatory Visit: Payer: Self-pay

## 2018-06-03 ENCOUNTER — Encounter (HOSPITAL_COMMUNITY): Payer: Self-pay

## 2018-06-03 DIAGNOSIS — Z5321 Procedure and treatment not carried out due to patient leaving prior to being seen by health care provider: Secondary | ICD-10-CM | POA: Insufficient documentation

## 2018-06-03 DIAGNOSIS — M7918 Myalgia, other site: Secondary | ICD-10-CM | POA: Insufficient documentation

## 2018-06-03 NOTE — ED Triage Notes (Signed)
Pt presents via EMS with c/o 10/10 abdominal pain that began over the weekend. Pt c/o chest wall pain that is worse with movement and palpation for several months.

## 2018-06-05 ENCOUNTER — Encounter (HOSPITAL_COMMUNITY): Payer: Self-pay | Admitting: Emergency Medicine

## 2018-06-05 ENCOUNTER — Other Ambulatory Visit: Payer: Self-pay

## 2018-06-05 ENCOUNTER — Emergency Department (EMERGENCY_DEPARTMENT_HOSPITAL)
Admission: EM | Admit: 2018-06-05 | Discharge: 2018-06-06 | Disposition: A | Discharge status: Home / Self Care | Payer: Medicare Other | Source: Home / Self Care | Attending: Emergency Medicine | Admitting: Emergency Medicine

## 2018-06-05 ENCOUNTER — Emergency Department (HOSPITAL_COMMUNITY)
Admission: EM | Admit: 2018-06-05 | Discharge: 2018-06-05 | Disposition: A | Discharge status: Home / Self Care | Payer: Medicare Other | Attending: Emergency Medicine | Admitting: Emergency Medicine

## 2018-06-05 DIAGNOSIS — R45851 Suicidal ideations: Secondary | ICD-10-CM

## 2018-06-05 DIAGNOSIS — F32A Depression, unspecified: Secondary | ICD-10-CM

## 2018-06-05 DIAGNOSIS — J449 Chronic obstructive pulmonary disease, unspecified: Secondary | ICD-10-CM | POA: Insufficient documentation

## 2018-06-05 DIAGNOSIS — Z7982 Long term (current) use of aspirin: Secondary | ICD-10-CM | POA: Insufficient documentation

## 2018-06-05 DIAGNOSIS — R0789 Other chest pain: Secondary | ICD-10-CM | POA: Diagnosis present

## 2018-06-05 DIAGNOSIS — F603 Borderline personality disorder: Secondary | ICD-10-CM

## 2018-06-05 DIAGNOSIS — Z79899 Other long term (current) drug therapy: Secondary | ICD-10-CM | POA: Insufficient documentation

## 2018-06-05 DIAGNOSIS — J45909 Unspecified asthma, uncomplicated: Secondary | ICD-10-CM | POA: Diagnosis not present

## 2018-06-05 DIAGNOSIS — F329 Major depressive disorder, single episode, unspecified: Secondary | ICD-10-CM | POA: Insufficient documentation

## 2018-06-05 DIAGNOSIS — F4325 Adjustment disorder with mixed disturbance of emotions and conduct: Secondary | ICD-10-CM | POA: Diagnosis not present

## 2018-06-05 DIAGNOSIS — Z87891 Personal history of nicotine dependence: Secondary | ICD-10-CM

## 2018-06-05 DIAGNOSIS — F209 Schizophrenia, unspecified: Secondary | ICD-10-CM | POA: Diagnosis not present

## 2018-06-05 DIAGNOSIS — G3184 Mild cognitive impairment, so stated: Secondary | ICD-10-CM | POA: Insufficient documentation

## 2018-06-05 LAB — CBC
HCT: 40.2 % (ref 36.0–46.0)
Hemoglobin: 12.9 g/dL (ref 12.0–15.0)
MCH: 31.9 pg (ref 26.0–34.0)
MCHC: 32.1 g/dL (ref 30.0–36.0)
MCV: 99.3 fL (ref 80.0–100.0)
PLATELETS: 329 10*3/uL (ref 150–400)
RBC: 4.05 MIL/uL (ref 3.87–5.11)
RDW: 13.1 % (ref 11.5–15.5)
WBC: 6.4 10*3/uL (ref 4.0–10.5)
nRBC: 0 % (ref 0.0–0.2)

## 2018-06-05 LAB — SALICYLATE LEVEL: Salicylate Lvl: <7 mg/dL (ref 2.8–30.0)

## 2018-06-05 LAB — ACETAMINOPHEN LEVEL: Acetaminophen (Tylenol), Serum: <10 ug/mL — ABNORMAL LOW (ref 10–30)

## 2018-06-05 LAB — COMPREHENSIVE METABOLIC PANEL
ALT: 15 U/L (ref 0–44)
AST: 21 U/L (ref 15–41)
Albumin: 3.4 g/dL — ABNORMAL LOW (ref 3.5–5.0)
Alkaline Phosphatase: 33 U/L — ABNORMAL LOW (ref 38–126)
Anion gap: 10 (ref 5–15)
BUN: 19 mg/dL (ref 6–20)
CO2: 23 mmol/L (ref 22–32)
Calcium: 9.2 mg/dL (ref 8.9–10.3)
Chloride: 107 mmol/L (ref 98–111)
Creatinine, Ser: 0.66 mg/dL (ref 0.44–1.00)
GFR calc Af Amer: >60 mL/min (ref >60)
GFR calc non Af Amer: >60 mL/min (ref >60)
Glucose, Bld: 121 mg/dL — ABNORMAL HIGH (ref 70–99)
POTASSIUM: 3.9 mmol/L (ref 3.5–5.1)
Sodium: 140 mmol/L (ref 135–145)
Total Bilirubin: 0.5 mg/dL (ref 0.3–1.2)
Total Protein: 6.4 g/dL — ABNORMAL LOW (ref 6.5–8.1)

## 2018-06-05 LAB — I-STAT BETA HCG BLOOD, ED (MC, WL, AP ONLY): I-stat hCG, quantitative: 5.5 m[IU]/mL — ABNORMAL HIGH (ref <5)

## 2018-06-05 LAB — ETHANOL: Alcohol, Ethyl (B): <10 mg/dL (ref <10)

## 2018-06-05 MED ORDER — ASPIRIN EC 81 MG PO TBEC
81.0000 mg | DELAYED_RELEASE_TABLET | Freq: Every day | ORAL | Status: DC
  Administered 2018-06-06: 81 mg via ORAL
  Filled 2018-06-05: qty 1

## 2018-06-05 MED ORDER — RISPERIDONE 2 MG PO TABS
3.0000 mg | ORAL_TABLET | Freq: Every day | ORAL | Status: DC
  Administered 2018-06-06: 3 mg via ORAL
  Filled 2018-06-05: qty 1

## 2018-06-05 MED ORDER — DIVALPROEX SODIUM 500 MG PO DR TAB
500.0000 mg | DELAYED_RELEASE_TABLET | Freq: Two times a day (BID) | ORAL | Status: DC
  Administered 2018-06-05 – 2018-06-06 (×2): 500 mg via ORAL
  Filled 2018-06-05 (×2): qty 1

## 2018-06-05 MED ORDER — ESCITALOPRAM OXALATE 10 MG PO TABS
20.0000 mg | ORAL_TABLET | Freq: Every day | ORAL | Status: DC
  Administered 2018-06-06: 20 mg via ORAL
  Filled 2018-06-05: qty 2

## 2018-06-05 MED ORDER — HYDROXYZINE HCL 25 MG PO TABS: 25.0000 mg | ORAL_TABLET | Freq: Four times a day (QID) | ORAL | Status: DC | PRN

## 2018-06-05 MED ORDER — CLONAZEPAM 0.5 MG PO TABS
0.5000 mg | ORAL_TABLET | Freq: Two times a day (BID) | ORAL | Status: DC
  Administered 2018-06-05 – 2018-06-06 (×2): 0.5 mg via ORAL
  Filled 2018-06-05 (×2): qty 1

## 2018-06-05 MED ORDER — LEVOTHYROXINE SODIUM 50 MCG PO TABS
50.0000 ug | ORAL_TABLET | Freq: Every day | ORAL | Status: DC
  Administered 2018-06-06: 50 ug via ORAL
  Filled 2018-06-05: qty 1
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
## 2018-06-05 NOTE — ED Provider Notes (Signed)
[facility]-EMERGENCY DEPT Provider Note   CSN: [id number] Arrival date & time: 06/05/18  2201    History   Chief Complaint Chief Complaint  Patient presents with  . Suicidal    HPI [name] is a 53 y.o. female.     The history is provided by the patient.  Mental Health Problem  Presenting symptoms: suicidal thoughts   Degree of incapacity (severity):  Moderate Timing:  Constant Progression:  Unchanged Chronicity:  Chronic Relieved by:  Nothing Worsened by:  Nothing  Patient with history of anxiety, asthma, cerebral palsy, broad personal disorder, COPD, depression, cognitive impairment Resents for her 14th ER visit in 6 months.  She reports she is suicidal, and she is homicidal. She wants to run out in traffic. Patient has had multiple evaluations recently Past Medical History:  Diagnosis Date  . Anxiety   . Asthma   . Borderline personality disorder (HCC)   . Cerebral palsy (HCC)   . COPD (chronic obstructive pulmonary disease) (HCC)   . Depression   . GERD (gastroesophageal reflux disease)   . Mild cognitive impairment   . Schizoaffective disorder (HCC)   . Wound abscess     Patient Active Problem List   Diagnosis Date Noted  . 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 Overdose by acetaminophen 08/08/2016  . Schizoaffective disorder, bipolar type (HCC)   . 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 GERD (gastroesophageal reflux disease) 09/24/2014  . COPD (chronic obstructive pulmonary disease) (HCC) 09/24/2014  . Borderline personality disorder (HCC) 09/24/2014  . Cerebral palsy (HCC) 08/31/2014    Past Surgical History:  Procedure Laterality Date  . CHOLECYSTECTOMY    . INCISION AND DRAINAGE ABSCESS Right 01/02/2015   Procedure: INCISION AND DRAINAGE ABSCESS;  Surgeon: Tiney Rouge III, MD;  Location: [facility] ORS;  Service: General;  Laterality: Right;  . TONSILLECTOMY       OB History     Gravida  0   Para  0   Term  0   Preterm  0   AB  0   Living  0     SAB  0   TAB  0   Ectopic  0   Multiple  0   Live Births               Home Medications    Prior to Admission medications   Medication Sig Start Date End Date Taking? Authorizing Provider  aspirin EC 81 MG tablet Take 1 tablet (81 mg total) by mouth daily. 12/14/17  Yes Clapacs, Jackquline Denmark, MD  clonazePAM (KLONOPIN) 0.5 MG tablet Take 1 tablet (0.5 mg total) by mouth 2 (two) times daily. 12/14/17  Yes Clapacs, Jackquline Denmark, MD  divalproex (DEPAKOTE) 500 MG DR tablet Take 1 tablet (500 mg total) by mouth 2 (two) times daily. 12/14/17  Yes Clapacs, Jackquline Denmark, MD  escitalopram (LEXAPRO) 20 MG tablet Take 1 tablet (20 mg total) by mouth daily. 12/14/17  Yes Clapacs, Jackquline Denmark, MD  fenofibrate 160 MG tablet Take 1 tablet (160 mg total) by mouth daily. 12/14/17  Yes Clapacs, Jackquline Denmark, MD  levothyroxine (SYNTHROID, LEVOTHROID) 50 MCG tablet Take 1 tablet (50 mcg total) by mouth daily before breakfast. 12/14/17  Yes Clapacs, Jackquline Denmark, MD  oxybutynin (DITROPAN-XL) 5 MG 24 hr tablet Take 1 tablet (5 mg total) by mouth daily. 12/14/17  Yes Clapacs, Jackquline Denmark, MD  pantoprazole (PROTONIX) 40 MG tablet Take 1 tablet (40  mg total) by mouth daily. 12/14/17  Yes Clapacs, Jackquline Denmark, MD  risperiDONE (RISPERDAL) 3 MG tablet Take 1 tablet (3 mg total) by mouth 2 (two) times daily. Patient taking differently: Take 3 mg by mouth daily.  12/14/17  Yes Clapacs, Jackquline Denmark, MD  sucralfate (CARAFATE) 1 g tablet Take 1 tablet (1 g total) by mouth 4 (four) times daily -  with meals and at bedtime. Patient taking differently: Take 1 g by mouth daily.  12/14/17  Yes Clapacs, Jackquline Denmark, MD  hydrOXYzine (ATARAX/VISTARIL) 25 MG tablet Take 1 tablet (25 mg total) by mouth every 6 (six) hours as needed for anxiety. 05/11/18   Fayrene Helper, PA-C  hydrOXYzine (ATARAX/VISTARIL) 25 MG tablet Take 1 tablet (25 mg total) by mouth every 8 (eight) hours as needed for anxiety. 05/30/18    Benjiman Core, MD    Family History Family History  Problem Relation Age of Onset  . Heart attack Father   . Diabetes Mother     Social History Social History   Tobacco Use  . Smoking status: Former Games developer  . Smokeless tobacco: Never Used  Substance Use Topics  . Alcohol use: No  . Drug use: No     Allergies   Penicillins   Review of Systems Review of Systems  Constitutional: Negative for fever.  Gastrointestinal: Negative for vomiting.  Psychiatric/Behavioral: Positive for suicidal ideas.  All other systems reviewed and are negative.    Physical Exam Updated Vital Signs BP 117/84 (BP Location: Right Arm)   Pulse 81   Temp 98.1 F (36.7 C) (Oral)   Resp 20   Ht 1.6 m (5\' 3" )   Wt 95.3 kg   LMP 05/28/2018 (LMP Unknown)   SpO2 92%   BMI 37.20 kg/m   Physical Exam  CONSTITUTIONAL: disheveled, anxious HEAD: Normocephalic/atraumatic EYES: EOMI ENMT: Mucous membranes moist NECK: supple no meningeal signs CV: S1/S2 noted, no murmurs/rubs/gallops noted LUNGS: Lungs are clear to auscultation bilaterally, no apparent distress ABDOMEN: soft NEURO: Pt is awake/alert/appropriate, moves all extremitiesx4.  No facial droop.   EXTREMITIES: pulses normal/equal, full ROM SKIN: warm, color normal PSYCH: anxious  ED Treatments / Results  Labs (all labs ordered are listed, but only abnormal results are displayed) Labs Reviewed - No data to display  EKG None  Radiology No results found.  Procedures Procedures    Medications Ordered in ED Medications  aspirin EC tablet 81 mg (has no administration in time range)  clonazePAM (KLONOPIN) tablet 0.5 mg (0.5 mg Oral Given 06/05/18 2326)  divalproex (DEPAKOTE) DR tablet 500 mg (500 mg Oral Given 06/05/18 2326)  escitalopram (LEXAPRO) tablet 20 mg (has no administration in time range)  hydrOXYzine (ATARAX/VISTARIL) tablet 25 mg (has no administration in time range)  levothyroxine (SYNTHROID, LEVOTHROID) tablet  50 mcg (has no administration in time range)  risperiDONE (RISPERDAL) tablet 3 mg (has no administration in time range)     Initial Impression / Assessment and Plan / ED Course  I have reviewed the triage vital signs and the nursing notes.     11:51 PM Patient presents for 14 ER visit in 6 months.  She continues to be suicidal and homicidal.  She was just seen in emergency department several hours ago with a full battery of labs that were unremarkable.  All labs were canceled at this time.  Behavioral health consulted  Final Clinical Impressions(s) / ED Diagnoses   Final diagnoses:  Suicidal ideation    ED Discharge Orders  None       Zadie RhineWickline, Rikia Sukhu, MD 06/05/18 2352

## 2018-06-05 NOTE — ED Provider Notes (Signed)
[facility] EMERGENCY DEPARTMENT Provider Note   CSN: [id number] Arrival date & time: 06/05/18  1711    History   Chief Complaint Chief Complaint  Patient presents with  . Suicidal  . Abdominal Pain  . Chest Pain    HPI [name] is a 53 y.o. female.     Pt presents to the ED today with SI and HI.  She has been here several times in the last week for the same.  She has seen MH several times also.  She is currently living with her family because she did not like her last group home.  She said her sister-in-law does not like her living there.  She has a crisis team which she did not call prior to coming in here.     Past Medical History:  Diagnosis Date  . Anxiety   . Asthma   . Borderline personality disorder (HCC)   . Cerebral palsy (HCC)   . COPD (chronic obstructive pulmonary disease) (HCC)   . Depression   . GERD (gastroesophageal reflux disease)   . Mild cognitive impairment   . Schizoaffective disorder (HCC)   . Wound abscess     Patient Active Problem List   Diagnosis Date Noted  . 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 Overdose by acetaminophen 08/08/2016  . Schizoaffective disorder, bipolar type (HCC)   . 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 GERD (gastroesophageal reflux disease) 09/24/2014  . COPD (chronic obstructive pulmonary disease) (HCC) 09/24/2014  . Borderline personality disorder (HCC) 09/24/2014  . Cerebral palsy (HCC) 08/31/2014    Past Surgical History:  Procedure Laterality Date  . CHOLECYSTECTOMY    . INCISION AND DRAINAGE ABSCESS Right 01/02/2015   Procedure: INCISION AND DRAINAGE ABSCESS;  Surgeon: Tiney Rouge III, MD;  Location: [facility] ORS;  Service: General;  Laterality: Right;  . TONSILLECTOMY       OB History    Gravida  0   Para  0   Term  0   Preterm  0   AB  0   Living  0     SAB  0   TAB  0   Ectopic  0   Multiple  0   Live Births                 Home Medications    Prior to Admission medications   Medication Sig Start Date End Date Taking? Authorizing Provider  aspirin EC 81 MG tablet Take 1 tablet (81 mg total) by mouth daily. 12/14/17   Clapacs, Jackquline Denmark, MD  cetirizine (ZYRTEC) 10 MG tablet Take 1 tablet (10 mg total) by mouth daily. Patient taking differently: Take 10 mg by mouth daily as needed for allergies.  12/14/17   Clapacs, Jackquline Denmark, MD  Cholecalciferol (VITAMIN D3) 125 MCG (5000 UT) TABS Take 1 tablet by mouth daily.    [provider]  clonazePAM (KLONOPIN) 0.5 MG tablet Take 1 tablet (0.5 mg total) by mouth 2 (two) times daily. 12/14/17   Clapacs, Jackquline Denmark, MD  divalproex (DEPAKOTE) 500 MG DR tablet Take 1 tablet (500 mg total) by mouth 2 (two) times daily. 12/14/17   Clapacs, Jackquline Denmark, MD  escitalopram (LEXAPRO) 20 MG tablet Take 1 tablet (20 mg total) by mouth daily. 12/14/17   Clapacs, Jackquline Denmark, MD  fenofibrate 160 MG tablet Take 1 tablet (160 mg total) by mouth daily. 12/14/17   Clapacs, Jackquline Denmark, MD  folic acid (FOLVITE) 1 MG tablet Take 1 tablet (1 mg total) by mouth daily. 12/14/17   Clapacs, Jackquline Denmark, MD  hydrOXYzine (ATARAX/VISTARIL) 25 MG tablet Take 1 tablet (25 mg total) by mouth every 6 (six) hours as needed for anxiety. 05/11/18   Fayrene Helper, PA-C  hydrOXYzine (ATARAX/VISTARIL) 25 MG tablet Take 1 tablet (25 mg total) by mouth every 8 (eight) hours as needed for anxiety. 05/30/18   Benjiman Core, MD  levothyroxine (SYNTHROID, LEVOTHROID) 50 MCG tablet Take 1 tablet (50 mcg total) by mouth daily before breakfast. 12/14/17   Clapacs, Jackquline Denmark, MD  oxybutynin (DITROPAN-XL) 5 MG 24 hr tablet Take 1 tablet (5 mg total) by mouth daily. 12/14/17   Clapacs, Jackquline Denmark, MD  pantoprazole (PROTONIX) 40 MG tablet Take 1 tablet (40 mg total) by mouth daily. 12/14/17   Clapacs, Jackquline Denmark, MD  risperiDONE (RISPERDAL) 3 MG tablet Take 1 tablet (3 mg total) by mouth 2 (two) times daily. Patient taking differently: Take 3 mg by  mouth daily.  12/14/17   Clapacs, Jackquline Denmark, MD  sucralfate (CARAFATE) 1 g tablet Take 1 tablet (1 g total) by mouth 4 (four) times daily -  with meals and at bedtime. 12/14/17   Clapacs, Jackquline Denmark, MD    Family History Family History  Problem Relation Age of Onset  . Heart attack Father   . Diabetes Mother     Social History Social History   Tobacco Use  . Smoking status: Former Games developer  . Smokeless tobacco: Never Used  Substance Use Topics  . Alcohol use: No  . Drug use: No     Allergies   Penicillins   Review of Systems Review of Systems  Psychiatric/Behavioral: Positive for suicidal ideas.  All other systems reviewed and are negative.    Physical Exam Updated Vital Signs BP (!) 134/91 (BP Location: Right Arm)   Pulse 73   Temp 97.9 F (36.6 C) (Oral)   Resp 17   Ht 5\' 4"  (1.626 m)   Wt 95 kg   LMP 05/28/2018   SpO2 96%   BMI 35.95 kg/m   Physical Exam Vitals signs and nursing note reviewed.  Constitutional:      Appearance: She is well-developed. She is obese.  HENT:     Head: Normocephalic and atraumatic.     Mouth/Throat:     Mouth: Mucous membranes are moist.     Pharynx: Oropharynx is clear.  Eyes:     Extraocular Movements: Extraocular movements intact.     Pupils: Pupils are equal, round, and reactive to light.  Cardiovascular:     Rate and Rhythm: Normal rate and regular rhythm.  Pulmonary:     Effort: Pulmonary effort is normal.     Breath sounds: Normal breath sounds.  Abdominal:     General: Abdomen is flat. Bowel sounds are normal.     Palpations: Abdomen is soft.     Tenderness: There is no abdominal tenderness.  Skin:    General: Skin is warm.     Capillary Refill: Capillary refill takes less than 2 seconds.  Neurological:     General: No focal deficit present.     Mental Status: She is alert and oriented to person, place, and time.  Psychiatric:        Mood and Affect: Mood normal.      ED Treatments / Results  Labs (all labs  ordered are listed, but only abnormal results are displayed) Labs Reviewed  COMPREHENSIVE  METABOLIC PANEL - Abnormal; Notable for the following components:      Result Value   Glucose, Bld 121 (*)    Total Protein 6.4 (*)    Albumin 3.4 (*)    Alkaline Phosphatase 33 (*)    All other components within normal limits  ACETAMINOPHEN LEVEL - Abnormal; Notable for the following components:   Acetaminophen (Tylenol), Serum <10 (*)    All other components within normal limits  I-STAT BETA HCG BLOOD, ED ([facility], [facility], [facility] ONLY) - Abnormal; Notable for the following components:   I-stat hCG, quantitative 5.5 (*)    All other components within normal limits  ETHANOL  SALICYLATE LEVEL  CBC  RAPID URINE DRUG SCREEN, HOSP PERFORMED    EKG EKG Interpretation  Date/Time:  Wednesday June 05 2018 17:15:14 EST Ventricular Rate:  73 PR Interval:  176 QRS Duration: 84 QT Interval:  410 QTC Calculation: 451 R Axis:   42 Text Interpretation:  Normal sinus rhythm Low voltage QRS Borderline ECG Confirmed by Jacalyn LefevreHaviland, Marivel Mcclarty [phone]([id number]) on 06/05/2018 7:56:17 PM   Radiology No results found.  Procedures Procedures (including critical care time)  Medications Ordered in ED Medications - No data to display   Initial Impression / Assessment and Plan / ED Course  I have reviewed the triage vital signs and the nursing notes.  Pertinent labs & imaging results that were available during my care of the patient were reviewed by me and considered in my medical decision making (see chart for details).       Pt has been evaluated multiple times by TTS.  I think there is some evidence of her borderline personality d/o with some possible malingering.  I don't think pt needs to stay again for another psych eval.  She has outpatient resources set up.  She is stable for d/c.  Final Clinical Impressions(s) / ED Diagnoses   Final diagnoses:  Depression, unspecified depression type    ED Discharge Orders     None       Jacalyn LefevreHaviland, Zhyon Antenucci, MD 06/05/18 2041
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
## 2018-06-05 NOTE — Discharge Instructions (Signed)
Call the [facility] C[facility] before you come to the ED.

## 2018-06-05 NOTE — ED Triage Notes (Signed)
Pt reports having chest wall pain, abdominal pain, and feels SI/HI. Pt reports her abd pains and chest pains have been going on for months now. Pt states she has felt SI/HI for a couple of year, reports a plan to overdose on medication.

## 2018-06-05 NOTE — ED Notes (Signed)
All belongings retrieved from locker 4 and given back to pt. Pt confirmed she has all belongings. Pt repeatedly asking if she can call "911" to be taken to another facility. Pt notified she has been seen and cleared by our EDP. Pt told she may do whatever she feels like she must do, but that she has been cleared by our EDP. Charge, RN notified. Pt ambulatory to lobby.

## 2018-06-05 NOTE — ED Notes (Signed)
Bed: WTR9 Expected date:  Expected time:  Means of arrival:  Comments: 

## 2018-06-05 NOTE — ED Notes (Signed)
Pt belongings in locker 4 in purple zone

## 2018-06-06 DIAGNOSIS — F329 Major depressive disorder, single episode, unspecified: Secondary | ICD-10-CM | POA: Diagnosis not present

## 2018-06-06 DIAGNOSIS — F4325 Adjustment disorder with mixed disturbance of emotions and conduct: Secondary | ICD-10-CM

## 2018-06-06 NOTE — Consult Note (Addendum)
[facility] Psych ED Discharge  06/06/2018 10:52 AM [name]  MRN:  [id number] Principal Problem: Adjustment disorder with mixed disturbance of emotions and conduct Discharge Diagnoses: Principal Problem:   Adjustment disorder with mixed disturbance of emotions and conduct   Subjective: cc: " I wanted to hurt myself yesterday and I feel that way sometimes".  HPI: Patient is well known to this ED and has been seen 8 times since January 2020. She presented to [facility] yesterday, discharged and patient presented at [facility] about one hour after discharge from [facility]. She currently lives with family after leaving group home where she resided previously because she didn't like it. She admits coming to ED when she gets upset or has conflict with her family. She reported that yesterday she called her crisis/ ACT team when she was upset and she was told to utilize her coping skills. She currently sees outpatient mental health provider Thurnell Garbe. At this time denies homicidal ideation or suicidal plans, no auditory or visual hallucinations. She stated she lives with her sister-in-law. She previously lived in a group home. Pt has made the rounds to [facility] ED, [facility] ED, [facility], [facility] and [facility] and is sometimes in the emergency room twice in a day. Pt is IDD but also is her own guardian. Pt is psychiatrically clear. Pt is to follow up with her [facility] CST.   Total Time spent with patient: 15 minutes  Past Psychiatric History: IDD, MDD, Anxiety, Schizoaffective Diorder  Past Medical History:  Past Medical History:  Diagnosis Date  . Anxiety   . Asthma   . Borderline personality disorder (HCC)   . Cerebral palsy (HCC)   . COPD (chronic obstructive pulmonary disease) (HCC)   . Depression   . GERD (gastroesophageal reflux disease)   . Mild cognitive impairment   . Schizoaffective disorder (HCC)   . Wound abscess     Past Surgical History:  Procedure Laterality Date  . CHOLECYSTECTOMY    . INCISION AND DRAINAGE  ABSCESS Right 01/02/2015   Procedure: INCISION AND DRAINAGE ABSCESS;  Surgeon: Tiney Rouge III, MD;  Location: [facility] ORS;  Service: General;  Laterality: Right;  . TONSILLECTOMY     Family History:  Family History  Problem Relation Age of Onset  . Heart attack Father   . Diabetes Mother    Family Psychiatric  History: None per chart review.  Social History:  Social History   Substance and Sexual Activity  Alcohol Use No     Social History   Substance and Sexual Activity  Drug Use No    Social History   Socioeconomic History  . Marital status: Single    Spouse name: Not on file  . Number of children: Not on file  . Years of education: Not on file  . Highest education level: Not on file  Occupational History  . Not on file  Social Needs  . Financial resource strain: Not on file  . Food insecurity:    Worry: Not on file    Inability: Not on file  . Transportation needs:    Medical: Not on file    Non-medical: Not on file  Tobacco Use  . Smoking status: Former Games developer  . Smokeless tobacco: Never Used  Substance and Sexual Activity  . Alcohol use: No  . Drug use: No  . Sexual activity: Not Currently  Lifestyle  . Physical activity:    Days per week: Not on file    Minutes per session: Not on file  .  Stress: Not on file  Relationships  . Social connections:    Talks on phone: Not on file    Gets together: Not on file    Attends religious service: Not on file    Active member of club or organization: Not on file    Attends meetings of clubs or organizations: Not on file    Relationship status: Not on file  Other Topics Concern  . Not on file  Social History Narrative  . Not on file    Has this patient used any form of tobacco in the last 30 days? (Cigarettes, Smokeless Tobacco, Cigars, and/or Pipes) Prescription not provided because: Pt denies tobacco use  Current Medications: Current Facility-Administered Medications  Medication Dose Route Frequency Provider  Last Rate Last Dose  . aspirin EC tablet 81 mg  81 mg Oral Daily Zadie RhineWickline, Donald, MD      . clonazePAM Scarlette Calico(KLONOPIN) tablet 0.5 mg  0.5 mg Oral BID Zadie RhineWickline, Donald, MD   0.5 mg at 06/05/18 2326  . divalproex (DEPAKOTE) DR tablet 500 mg  500 mg Oral BID Zadie RhineWickline, Donald, MD   500 mg at 06/05/18 2326  . escitalopram (LEXAPRO) tablet 20 mg  20 mg Oral Daily Zadie RhineWickline, Donald, MD      . hydrOXYzine (ATARAX/VISTARIL) tablet 25 mg  25 mg Oral Q6H PRN Zadie RhineWickline, Donald, MD      . levothyroxine (SYNTHROID, LEVOTHROID) tablet 50 mcg  50 mcg Oral QAC breakfast Zadie RhineWickline, Donald, MD   50 mcg at 06/06/18 0610  . risperiDONE (RISPERDAL) tablet 3 mg  3 mg Oral Daily Zadie RhineWickline, Donald, MD       Current Outpatient Medications  Medication Sig Dispense Refill  . aspirin EC 81 MG tablet Take 1 tablet (81 mg total) by mouth daily. 30 tablet 1  . clonazePAM (KLONOPIN) 0.5 MG tablet Take 1 tablet (0.5 mg total) by mouth 2 (two) times daily. 60 tablet 1  . divalproex (DEPAKOTE) 500 MG DR tablet Take 1 tablet (500 mg total) by mouth 2 (two) times daily. 60 tablet 1  . escitalopram (LEXAPRO) 20 MG tablet Take 1 tablet (20 mg total) by mouth daily. 30 tablet 1  . fenofibrate 160 MG tablet Take 1 tablet (160 mg total) by mouth daily. 30 tablet 1  . levothyroxine (SYNTHROID, LEVOTHROID) 50 MCG tablet Take 1 tablet (50 mcg total) by mouth daily before breakfast. 30 tablet 1  . oxybutynin (DITROPAN-XL) 5 MG 24 hr tablet Take 1 tablet (5 mg total) by mouth daily. 30 tablet 1  . pantoprazole (PROTONIX) 40 MG tablet Take 1 tablet (40 mg total) by mouth daily. 30 tablet 1  . risperiDONE (RISPERDAL) 3 MG tablet Take 1 tablet (3 mg total) by mouth 2 (two) times daily. (Patient taking differently: Take 3 mg by mouth daily. ) 60 tablet 1  . sucralfate (CARAFATE) 1 g tablet Take 1 tablet (1 g total) by mouth 4 (four) times daily -  with meals and at bedtime. (Patient taking differently: Take 1 g by mouth daily. ) 120 tablet 1  .  hydrOXYzine (ATARAX/VISTARIL) 25 MG tablet Take 1 tablet (25 mg total) by mouth every 6 (six) hours as needed for anxiety. 12 tablet 0  . hydrOXYzine (ATARAX/VISTARIL) 25 MG tablet Take 1 tablet (25 mg total) by mouth every 8 (eight) hours as needed for anxiety. 12 tablet 0     Musculoskeletal: Strength & Muscle Tone: within normal limits Gait & Station: not observed Patient leans: N/A  Psychiatric Specialty Exam:  Physical Exam  Nursing note and vitals reviewed. Constitutional: She is oriented to person, place, and time. She appears well-developed.  HENT:  Head: Normocephalic and atraumatic.  Neck: Normal range of motion.  Respiratory: Effort normal.  Neurological: She is alert and oriented to person, place, and time.  Psychiatric: Her speech is normal and behavior is normal. Thought content normal. Cognition and memory are impaired. She expresses impulsivity. She exhibits a depressed mood.    Review of Systems  Constitutional: Negative.   Psychiatric/Behavioral: Positive for depression. Negative for hallucinations, memory loss and substance abuse. The patient is not nervous/anxious and does not have insomnia.   All other systems reviewed and are negative.   Blood pressure 112/70, pulse 70, temperature (!) 97.5 F (36.4 C), temperature source Oral, resp. rate 20, height 5\' 3"  (1.6 m), weight 95.3 kg, last menstrual period 05/28/2018, SpO2 97 %.Body mass index is 37.2 kg/m.  General Appearance: Casual  Eye Contact:  Fair  Speech:  Difficulty to understand occassionally  Volume:  Normal  Mood:  Depressed  Affect:  Appropriate and Congruent  Thought Process:  Coherent  Orientation:  Full (Time, Place, and Person)  Thought Content:  Logical  Suicidal Thoughts:  No  Homicidal Thoughts:  No  Memory:  Immediate;   Fair Recent;   Fair Remote;   Fair  Judgement:  Poor  Insight:  Shallow  Psychomotor Activity:  Normal  Concentration:  Concentration: Fair and Attention Span: Fair   Recall:  [employer] of Knowledge:  Fair  Language:  Fair  Akathisia:  No  Handed:  Right  AIMS (if indicated):   N/A  Assets:  Financial Resources/Insurance Housing Social Support  ADL's:  Intact  Cognition:  Impaired,  Moderate  Sleep:   N/A     Demographic Factors:  Caucasian  Loss Factors: NA  Historical Factors: Impulsivity  Risk Reduction Factors:   Living with another person, especially a relative and Positive social support  Continued Clinical Symptoms:  More than one psychiatric diagnosis  Cognitive Features That Contribute To Risk:  Closed-mindedness    Suicide Risk:  Minimal: No identifiable suicidal ideation.  Patients presenting with no risk factors but with morbid ruminations; may be classified as minimal risk based on the severity of the depressive symptoms    Plan Of Care/Follow-up recommendations:  Activity:  As tolerated Diet:  Heart Healthy  Disposition:  Adjustment Disorder with Mixed Disturbance of Emotions and Conduct Patient discharged home. Follow up with crisis team and outpatient mental health provider. Take all medications as prescribed.   Sherri Abbe, NP 06/06/2018, 10:52 AM   Patient seen face-to-face for psychiatric evaluation, chart reviewed and case discussed with the physician extender and developed treatment plan. Reviewed the information documented and agree with the treatment plan.  [name], DO 06/06/18 12:00 PM

## 2018-06-06 NOTE — ED Notes (Signed)
Per pt request, pt's family called about pt being discharge. Pt's brother called and states that they deal with her mental health issues everyday.  They are aware of her activity.  Brother states he is unable to pick pt up but will call other family to pick pt up.  Pt's brother states that she is safe to wait in the lobby until family is able to pick pt up. Pt informed that family was reach to pick her up and pt was given her clothing to change into.

## 2018-06-06 NOTE — Progress Notes (Signed)
CSW spoke with patients care coordinator with [facility], Gari Crown [phone], to determine where patient is currently living at. Per care coordinator, patient is still currently residing with brother/ sister in law. [facility] is attempting to place patient into a group home however [facility] is having difficulties with funding. Care coordinator directed CSW to contact sister in law due to brother working out of town. Patient does NOT have a legal guardian.   CSW spoke with patients sister-in-law, Vertell Limber [phone], to discuss disposition plans. Sister-in-law is not available to pick patient up at this time and stated she informed patient she would not be picking her up. Sister in law stated patient is able to return to their home at this time. CSW is able to provide patient with taxi voucher- address is [address] [place] [address]  Stacy Gardner, LCSW Clinical Social Worker  [facility] [employer]  [phone]

## 2018-06-06 NOTE — BH Assessment (Signed)
[facility] Assessment Progress Note  Per [name], DO, this pt does not require psychiatric hospitalization at this time.  Pt is to be discharged from [facility] with recommendation to continue treatment with the [facility] Community Support Team.  This has been included in pt's discharge instructions.  Stacy Gardner, CSW will be asked to facilitate pt's return to the community.  Pt's nurse, Kendal Hymen, has been notified.  Doylene Canning, MA Triage Specialist [phone]

## 2018-06-06 NOTE — ED Notes (Signed)
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  [employer] called for the pt and family made aware that pt is going home via a cab. Family member named Jasmine December expressed understanding. Pt escorted with staff to the lobby.

## 2018-06-06 NOTE — Discharge Instructions (Signed)
For your behavioral health needs, you are advised to continue treatment with the [facility] Community Support Team: ° °     [facility] Community Support Team °     [facility], [address] °     [address] °     [place], [place]  [address] °     [phone] °     Crisis number: [phone] °

## 2018-06-09 ENCOUNTER — Emergency Department (HOSPITAL_COMMUNITY)
Admission: EM | Admit: 2018-06-09 | Discharge: 2018-06-10 | Disposition: A | Discharge status: Home / Self Care | Payer: Medicare Other | Attending: Emergency Medicine | Admitting: Emergency Medicine

## 2018-06-09 ENCOUNTER — Other Ambulatory Visit: Payer: Self-pay

## 2018-06-09 ENCOUNTER — Encounter (HOSPITAL_COMMUNITY): Payer: Self-pay | Admitting: Emergency Medicine

## 2018-06-09 DIAGNOSIS — J45909 Unspecified asthma, uncomplicated: Secondary | ICD-10-CM | POA: Insufficient documentation

## 2018-06-09 DIAGNOSIS — F70 Mild intellectual disabilities: Secondary | ICD-10-CM | POA: Insufficient documentation

## 2018-06-09 DIAGNOSIS — F250 Schizoaffective disorder, bipolar type: Secondary | ICD-10-CM

## 2018-06-09 DIAGNOSIS — Z87891 Personal history of nicotine dependence: Secondary | ICD-10-CM | POA: Diagnosis not present

## 2018-06-09 DIAGNOSIS — G809 Cerebral palsy, unspecified: Secondary | ICD-10-CM | POA: Insufficient documentation

## 2018-06-09 DIAGNOSIS — F603 Borderline personality disorder: Secondary | ICD-10-CM | POA: Insufficient documentation

## 2018-06-09 DIAGNOSIS — F4325 Adjustment disorder with mixed disturbance of emotions and conduct: Secondary | ICD-10-CM | POA: Insufficient documentation

## 2018-06-09 DIAGNOSIS — J449 Chronic obstructive pulmonary disease, unspecified: Secondary | ICD-10-CM | POA: Insufficient documentation

## 2018-06-09 DIAGNOSIS — Z79899 Other long term (current) drug therapy: Secondary | ICD-10-CM | POA: Diagnosis not present

## 2018-06-09 DIAGNOSIS — R45851 Suicidal ideations: Secondary | ICD-10-CM | POA: Insufficient documentation

## 2018-06-09 DIAGNOSIS — Z008 Encounter for other general examination: Secondary | ICD-10-CM | POA: Diagnosis present

## 2018-06-09 DIAGNOSIS — Z7982 Long term (current) use of aspirin: Secondary | ICD-10-CM | POA: Insufficient documentation

## 2018-06-09 LAB — SALICYLATE LEVEL: Salicylate Lvl: <7 mg/dL (ref 2.8–30.0)

## 2018-06-09 LAB — COMPREHENSIVE METABOLIC PANEL
ALT: 16 U/L (ref 0–44)
AST: 25 U/L (ref 15–41)
Albumin: 3.6 g/dL (ref 3.5–5.0)
Alkaline Phosphatase: 38 U/L (ref 38–126)
Anion gap: 8 (ref 5–15)
BUN: 24 mg/dL — ABNORMAL HIGH (ref 6–20)
CALCIUM: 9.1 mg/dL (ref 8.9–10.3)
CO2: 24 mmol/L (ref 22–32)
Chloride: 108 mmol/L (ref 98–111)
Creatinine, Ser: 0.72 mg/dL (ref 0.44–1.00)
GFR calc Af Amer: >60 mL/min (ref >60)
GFR calc non Af Amer: >60 mL/min (ref >60)
Glucose, Bld: 104 mg/dL — ABNORMAL HIGH (ref 70–99)
Potassium: 4.5 mmol/L (ref 3.5–5.1)
Sodium: 140 mmol/L (ref 135–145)
Total Bilirubin: 0.4 mg/dL (ref 0.3–1.2)
Total Protein: 6.7 g/dL (ref 6.5–8.1)

## 2018-06-09 LAB — CBC
HCT: 39 % (ref 36.0–46.0)
Hemoglobin: 12.5 g/dL (ref 12.0–15.0)
MCH: 33.1 pg (ref 26.0–34.0)
MCHC: 32.1 g/dL (ref 30.0–36.0)
MCV: 103.2 fL — ABNORMAL HIGH (ref 80.0–100.0)
Platelets: 300 10*3/uL (ref 150–400)
RBC: 3.78 MIL/uL — ABNORMAL LOW (ref 3.87–5.11)
RDW: 13.2 % (ref 11.5–15.5)
WBC: 8.5 10*3/uL (ref 4.0–10.5)
nRBC: 0 % (ref 0.0–0.2)

## 2018-06-09 LAB — URINALYSIS, ROUTINE W REFLEX MICROSCOPIC
Bilirubin Urine: NEGATIVE
Glucose, UA: NEGATIVE mg/dL
Ketones, ur: NEGATIVE mg/dL
Nitrite: NEGATIVE
Protein, ur: NEGATIVE mg/dL
Specific Gravity, Urine: 1.02 (ref 1.005–1.030)
WBC, UA: >50 WBC/hpf — ABNORMAL HIGH (ref 0–5)
pH: 5 (ref 5.0–8.0)

## 2018-06-09 LAB — RAPID URINE DRUG SCREEN, HOSP PERFORMED
Amphetamines: NOT DETECTED
Barbiturates: NOT DETECTED
Benzodiazepines: NOT DETECTED
Cocaine: NOT DETECTED
Opiates: NOT DETECTED
Tetrahydrocannabinol: NOT DETECTED

## 2018-06-09 LAB — I-STAT BETA HCG BLOOD, ED (MC, WL, AP ONLY): HCG, QUANTITATIVE: 5.4 m[IU]/mL — AB (ref <5)

## 2018-06-09 LAB — LIPASE, BLOOD: Lipase: 43 U/L (ref 11–51)

## 2018-06-09 LAB — ETHANOL: Alcohol, Ethyl (B): <10 mg/dL (ref <10)

## 2018-06-09 LAB — ACETAMINOPHEN LEVEL: Acetaminophen (Tylenol), Serum: <10 ug/mL — ABNORMAL LOW (ref 10–30)

## 2018-06-09 MED ORDER — HYDROXYZINE HCL 25 MG PO TABS: 25.0000 mg | ORAL_TABLET | Freq: Three times a day (TID) | ORAL | Status: DC | PRN

## 2018-06-09 MED ORDER — ESCITALOPRAM OXALATE 10 MG PO TABS
20.0000 mg | ORAL_TABLET | Freq: Every day | ORAL | Status: DC
  Administered 2018-06-09 – 2018-06-10 (×2): 20 mg via ORAL
  Filled 2018-06-09 (×2): qty 2

## 2018-06-09 MED ORDER — SUCRALFATE 1 G PO TABS
1.0000 g | ORAL_TABLET | Freq: Three times a day (TID) | ORAL | Status: DC
  Administered 2018-06-09: 1 g via ORAL
  Filled 2018-06-09 (×5): qty 1

## 2018-06-09 MED ORDER — DIVALPROEX SODIUM 500 MG PO DR TAB
500.0000 mg | DELAYED_RELEASE_TABLET | Freq: Two times a day (BID) | ORAL | Status: DC
  Administered 2018-06-09 – 2018-06-10 (×2): 500 mg via ORAL
  Filled 2018-06-09 (×2): qty 1

## 2018-06-09 MED ORDER — FOSFOMYCIN TROMETHAMINE 3 G PO PACK
3.0000 g | PACK | Freq: Once | ORAL | Status: AC
  Administered 2018-06-09: 3 g via ORAL
  Filled 2018-06-09: qty 3

## 2018-06-09 MED ORDER — LEVOTHYROXINE SODIUM 50 MCG PO TABS
50.0000 ug | ORAL_TABLET | Freq: Every day | ORAL | Status: DC
  Administered 2018-06-10: 50 ug via ORAL
  Filled 2018-06-09: qty 1

## 2018-06-09 MED ORDER — HYDROXYZINE HCL 25 MG PO TABS: 25.0000 mg | ORAL_TABLET | Freq: Four times a day (QID) | ORAL | Status: DC | PRN

## 2018-06-09 MED ORDER — OXYBUTYNIN CHLORIDE ER 5 MG PO TB24
5.0000 mg | ORAL_TABLET | Freq: Every day | ORAL | Status: DC
  Administered 2018-06-10: 5 mg via ORAL
  Filled 2018-06-09: qty 1

## 2018-06-09 MED ORDER — RISPERIDONE 2 MG PO TABS
3.0000 mg | ORAL_TABLET | Freq: Two times a day (BID) | ORAL | Status: DC
  Administered 2018-06-09 – 2018-06-10 (×2): 3 mg via ORAL
  Filled 2018-06-09 (×3): qty 1

## 2018-06-09 MED ORDER — ASPIRIN EC 81 MG PO TBEC
81.0000 mg | DELAYED_RELEASE_TABLET | Freq: Every day | ORAL | Status: DC
  Administered 2018-06-09 – 2018-06-10 (×2): 81 mg via ORAL
  Filled 2018-06-09 (×2): qty 1

## 2018-06-09 MED ORDER — FENOFIBRATE 160 MG PO TABS
160.0000 mg | ORAL_TABLET | Freq: Every day | ORAL | Status: DC
  Administered 2018-06-10: 160 mg via ORAL
  Filled 2018-06-09: qty 1

## 2018-06-09 MED ORDER — PANTOPRAZOLE SODIUM 40 MG PO TBEC
40.0000 mg | DELAYED_RELEASE_TABLET | Freq: Every day | ORAL | Status: DC
  Administered 2018-06-09 – 2018-06-10 (×2): 40 mg via ORAL
  Filled 2018-06-09 (×2): qty 1

## 2018-06-09 MED ORDER — CLONAZEPAM 0.5 MG PO TABS
0.5000 mg | ORAL_TABLET | Freq: Two times a day (BID) | ORAL | Status: DC
  Administered 2018-06-09 – 2018-06-10 (×2): 0.5 mg via ORAL
  Filled 2018-06-09 (×2): qty 1

## 2018-06-09 NOTE — ED Notes (Signed)
Patient keeps ringing call bell repeatedly. Worried about being discharged, informed that she would be staying overnight.

## 2018-06-09 NOTE — BH Assessment (Signed)
Tele Assessment Note   Patient Name: [name] MRN: [id number] Referring Physician: Gerhard Munch, MD Location of Patient: [facility] Location of Provider: Behavioral Health TTS Department  [name] is an 53 y.o. female Patient with history of anxiety, asthma, cerebral palsy, broad personal disorder, COPD, depression, cognitive impairment. Patient has visited the ER 5x since 05/17/2018 with similar complaints suicidal / homicidal with plan to stab herself. Patient reports being homicidal towards her family stating, "I hate my family." Patient report being suicidal for a while. Patient brought herself to the ER after calling [facility] reporting she felt suicidal / homicidal. Patient has a ACTT team [facility] [phone]) who was notified by [facility] and whom spoke with patient prior to her visit to the ER.   Sherri Means, NP, patient will be observed overnight in the ER in re-assessed tomorrow Psych-team.     Diagnosis: Schizoaffective d/o, BPD  Past Medical History:  Past Medical History:  Diagnosis Date  . Anxiety   . Asthma   . Borderline personality disorder (HCC)   . Cerebral palsy (HCC)   . COPD (chronic obstructive pulmonary disease) (HCC)   . Depression   . GERD (gastroesophageal reflux disease)   . Mild cognitive impairment   . Schizoaffective disorder (HCC)   . Wound abscess     Past Surgical History:  Procedure Laterality Date  . CHOLECYSTECTOMY    . INCISION AND DRAINAGE ABSCESS Right 01/02/2015   Procedure: INCISION AND DRAINAGE ABSCESS;  Surgeon: Tiney Rouge III, MD;  Location: [facility] ORS;  Service: General;  Laterality: Right;  . TONSILLECTOMY      Family History:  Family History  Problem Relation Age of Onset  . Heart attack Father   . Diabetes Mother     Social History:  reports that she has quit smoking. She has never used smokeless tobacco. She reports that she does not drink alcohol or use drugs.  Additional Social History:  Alcohol / Drug  Use Pain Medications: See PTA medication list Prescriptions: See PTA medication list Over the Counter: See PTA medication list. History of alcohol / drug use?: No history of alcohol / drug abuse Longest period of sobriety (when/how long): NA  CIWA: CIWA-Ar BP: 103/73 Pulse Rate: 76 COWS:    Allergies:  Allergies  Allergen Reactions  . Penicillins Nausea And Vomiting and Other (See Comments)    Has patient had a PCN reaction causing immediate rash, facial/tongue/throat swelling, SOB or lightheadedness with hypotension: No Has patient had a PCN reaction causing severe rash involving mucus membranes or skin necrosis: No Has patient had a PCN reaction that required hospitalization No Has patient had a PCN reaction occurring within the last 10 years: No If all of the above answers are "NO", then may proceed with Cephalosporin use      Home Medications: (Not in a hospital admission)   OB/GYN Status:  Patient's last menstrual period was 05/28/2018 (lmp unknown).  General Assessment Data Location of Assessment: [facility] TTS Assessment: In system Is this a Tele or Face-to-Face Assessment?: Face-to-Face Is this an Initial Assessment or a Re-assessment for this encounter?: Initial Assessment Patient Accompanied by:: N/A Language Other than English: No Living Arrangements: Other (Comment)(pt report she lives with her family ) What gender do you identify as?: Female Marital status: Single Pregnancy Status: No Living Arrangements: Parent, Other relatives Can pt return to current living arrangement?: Yes Admission Status: Voluntary Is patient capable of signing voluntary admission?: Yes Referral Source: Self/Family/Friend Insurance type: Medicare  Crisis Care Plan Living Arrangements: Parent, Other relatives Name of Psychiatrist: Psychtherapeutic  Name of Therapist: Psych-therapeutic   Education Status Is patient currently in school?: No Is the patient employed, unemployed  or receiving disability?: Receiving disability income  Risk to self with the past 6 months Suicidal Ideation: Yes-Currently Present Has patient been a risk to self within the past 6 months prior to admission? : Yes Suicidal Intent: Yes-Currently Present Has patient had any suicidal intent within the past 6 months prior to admission? : Yes Is patient at risk for suicide?: Yes Suicidal Plan?: Yes-Currently Present Has patient had any suicidal plan within the past 6 months prior to admission? : Yes Specify Current Suicidal Plan: Stab self with a knife  Access to Green: Yes Specify Access to Suicidal Green: Patient has access to knives  What has been your use of drugs/alcohol within the last 12 months?: Negative, denied substance use  Previous Attempts/Gestures: Yes How many times?: 3 Other Self Harm Risks: None report  Triggers for Past Attempts: None known Intentional Self Injurious Behavior: Burning(pt report burning self in the past ) Comment - Self Injurious Behavior: None currently reported  Family Suicide History: Unknown Recent stressful life event(s): Conflict (Comment)(conflict with family ) Persecutory voices/beliefs?: Yes Depression: Yes Depression Symptoms: Despondent, Isolating, Loss of interest in usual pleasures, Feeling angry/irritable Substance abuse history and/or treatment for substance abuse?: No Suicide prevention information given to non-admitted patients: Not applicable  Risk to Others within the past 6 months Homicidal Ideation: Yes-Currently Present Does patient have any lifetime risk of violence toward others beyond the six months prior to admission? : No Thoughts of Harm to Others: Yes-Currently Present Comment - Thoughts of Harm to Others: pt report she wants to hurt her family  Current Homicidal Intent: No-Not Currently/Within Last 6 Months Current Homicidal Plan: No-Not Currently/Within Last 6 Months Describe Current Homicidal Plan: pt denied plan only  thoughts  Access to Homicidal Green: Yes Describe Access to Homicidal Green: pt has access to knives  Identified Victim: family (no specific family member)  History of harm to others?: No Assessment of Violence: None Noted Violent Behavior Description: N/A  Does patient have access to weapons?: Yes (Comment)(knives in the kitchen ) Criminal Charges Pending?: No Does patient have a court date: No Is patient on probation?: No  Psychosis Hallucinations: Auditory, Visual Delusions: None noted  Mental Status Report Appearance/Hygiene: In scrubs Eye Contact: Good Motor Activity: Freedom of movement Speech: Logical/coherent Level of Consciousness: Alert Mood: Helpless, Angry Affect: Angry Anxiety Level: Panic Attacks Panic attack frequency: daily  Most recent panic attack: today  Thought Processes: Coherent, Relevant Judgement: Impaired Orientation: Person, Place, Situation, Time Obsessive Compulsive Thoughts/Behaviors: None  Cognitive Functioning Concentration: Normal Memory: Recent Intact, Remote Intact Is patient IDD: Yes Level of Function: Mild Is IQ score available?: No Insight: Fair Impulse Control: Fair Appetite: Poor Have you had any weight changes? : No Change Sleep: Decreased Total Hours of Sleep: (pt report about 4 hours ) Vegetative Symptoms: None  ADLScreening [facility] Assessment Services) Patient's cognitive ability adequate to safely complete daily activities?: Yes Patient able to express need for assistance with ADLs?: Yes Independently performs ADLs?: Yes (appropriate for developmental age)  Prior Inpatient Therapy Prior Inpatient Therapy: Yes Prior Therapy Dates: Per chart, "multiple."  Prior Therapy Facilty/Provider(s): Per chart, "multiple."  Reason for Treatment: Per chart, "IDD and Depression."   Prior Outpatient Therapy Prior Outpatient Therapy: Yes Prior Therapy Dates: Current. Prior Therapy Facilty/Provider(s): [facility]  [facility]. Reason for  Treatment: Depression. Does patient have an ACCT team?: Yes([facility] [phone]) ) Does patient have Intensive In-House Services?  : No Does patient have [facility] services? : No Does patient have P4CC services?: No  ADL Screening (condition at time of admission) Patient's cognitive ability adequate to safely complete daily activities?: Yes Is the patient deaf or have difficulty hearing?: No Does the patient have difficulty seeing, even when wearing glasses/contacts?: Yes Does the patient have difficulty concentrating, remembering, or making decisions?: Yes Patient able to express need for assistance with ADLs?: Yes Does the patient have difficulty dressing or bathing?: Yes Independently performs ADLs?: Yes (appropriate for developmental age) Does the patient have difficulty walking or climbing stairs?: Yes Weakness of Legs: Right Weakness of Arms/Hands: None       Abuse/Neglect Assessment (Assessment to be complete while patient is alone) Abuse/Neglect Assessment Can Be Completed: Yes Physical Abuse: Denies Verbal Abuse: Yes, past (Comment) Sexual Abuse: Denies Exploitation of patient/patient's resources: Denies Self-Neglect: Denies     Merchant navy officer (For Healthcare) Does Patient Have a Medical Advance Directive?: No Would patient like information on creating a medical advance directive?: No - Patient declined          Disposition:  Disposition Initial Assessment Completed for this Encounter: Yes(Jamison Lord, NP, observation overnight )  This service was provided via telemedicine using a 2-way, interactive audio and video technology.  Names of all persons participating in this telemedicine service and their role in this encounter. Name:  Kaylani Fromme  Role: patient   Name:  Blane Ohara Role:  TTS assessor    Dian Situ 06/09/2018 5:17 PM

## 2018-06-09 NOTE — ED Notes (Signed)
Urine culture sent to lab with UA sample 

## 2018-06-09 NOTE — ED Notes (Signed)
Bed: [facility] Expected date:  Expected time:  Means of arrival:  Comments: 

## 2018-06-09 NOTE — ED Triage Notes (Signed)
Per [facility] pt from home for SI and fatigue for several days.   VItals: 118/84, 76HR, 16R, 95% on RA.

## 2018-06-09 NOTE — ED Provider Notes (Signed)
[facility]-EMERGENCY DEPT Provider Note   CSN: [id number] Arrival date & time: 06/09/18  1306    History   Chief Complaint Chief Complaint  Patient presents with  . Suicidal  . Fatigue  . Diarrhea    HPI [name] is a 53 y.o. female.     HPI Patient presents with concern of suicidal ideation peer Patient has cerebral palsy, multiple medical issues, states that she takes her medication as directed.  On she is a very poor historian, but it seems as though she has had increasingly severe thoughts of suicide over the past days, possibly longer. She denies physical complaints She denies clear precipitant.  She does acknowledge prior hospitalization, though cannot specify when.  Past Medical History:  Diagnosis Date  . Anxiety   . Asthma   . Borderline personality disorder (HCC)   . Cerebral palsy (HCC)   . COPD (chronic obstructive pulmonary disease) (HCC)   . Depression   . GERD (gastroesophageal reflux disease)   . Mild cognitive impairment   . Schizoaffective disorder (HCC)   . Wound abscess     Patient Active Problem List   Diagnosis Date Noted  . 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 Overdose by acetaminophen 08/08/2016  . Schizoaffective disorder, bipolar type (HCC)   . 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 GERD (gastroesophageal reflux disease) 09/24/2014  . COPD (chronic obstructive pulmonary disease) (HCC) 09/24/2014  . Borderline personality disorder (HCC) 09/24/2014  . Cerebral palsy (HCC) 08/31/2014    Past Surgical History:  Procedure Laterality Date  . CHOLECYSTECTOMY    . INCISION AND DRAINAGE ABSCESS Right 01/02/2015   Procedure: INCISION AND DRAINAGE ABSCESS;  Surgeon: Tiney Rougealph Ely III, MD;  Location: [facility] ORS;  Service: General;  Laterality: Right;  . TONSILLECTOMY       OB History    Gravida  0   Para  0   Term  0   Preterm  0   AB  0   Living  0     SAB    0   TAB  0   Ectopic  0   Multiple  0   Live Births               Home Medications    Prior to Admission medications   Medication Sig Start Date End Date Taking? Authorizing Provider  aspirin EC 81 MG tablet Take 1 tablet (81 mg total) by mouth daily. 12/14/17   Clapacs, Jackquline DenmarkJohn T, MD  clonazePAM (KLONOPIN) 0.5 MG tablet Take 1 tablet (0.5 mg total) by mouth 2 (two) times daily. 12/14/17   Clapacs, Jackquline DenmarkJohn T, MD  divalproex (DEPAKOTE) 500 MG DR tablet Take 1 tablet (500 mg total) by mouth 2 (two) times daily. 12/14/17   Clapacs, Jackquline DenmarkJohn T, MD  escitalopram (LEXAPRO) 20 MG tablet Take 1 tablet (20 mg total) by mouth daily. 12/14/17   Clapacs, Jackquline DenmarkJohn T, MD  fenofibrate 160 MG tablet Take 1 tablet (160 mg total) by mouth daily. 12/14/17   Clapacs, Jackquline DenmarkJohn T, MD  hydrOXYzine (ATARAX/VISTARIL) 25 MG tablet Take 1 tablet (25 mg total) by mouth every 6 (six) hours as needed for anxiety. 05/11/18   Fayrene Helperran, Bowie, PA-C  hydrOXYzine (ATARAX/VISTARIL) 25 MG tablet Take 1 tablet (25 mg total) by mouth every 8 (eight) hours as needed for anxiety. 05/30/18   Benjiman CorePickering, Nathan, MD  levothyroxine (SYNTHROID, LEVOTHROID) 50 MCG tablet Take 1 tablet (50 mcg total) by mouth  daily before breakfast. 12/14/17   Clapacs, Jackquline Denmark, MD  oxybutynin (DITROPAN-XL) 5 MG 24 hr tablet Take 1 tablet (5 mg total) by mouth daily. 12/14/17   Clapacs, Jackquline Denmark, MD  pantoprazole (PROTONIX) 40 MG tablet Take 1 tablet (40 mg total) by mouth daily. 12/14/17   Clapacs, Jackquline Denmark, MD  risperiDONE (RISPERDAL) 3 MG tablet Take 1 tablet (3 mg total) by mouth 2 (two) times daily. Patient taking differently: Take 3 mg by mouth daily.  12/14/17   Clapacs, Jackquline Denmark, MD  sucralfate (CARAFATE) 1 g tablet Take 1 tablet (1 g total) by mouth 4 (four) times daily -  with meals and at bedtime. Patient taking differently: Take 1 g by mouth daily.  12/14/17   Clapacs, Jackquline Denmark, MD    Family History Family History  Problem Relation Age of Onset  . Heart attack Father   .  Diabetes Mother     Social History Social History   Tobacco Use  . Smoking status: Former Games developer  . Smokeless tobacco: Never Used  Substance Use Topics  . Alcohol use: No  . Drug use: No     Allergies   Penicillins   Review of Systems Review of Systems  Constitutional:       Per HPI, otherwise negative  HENT:       Per HPI, otherwise negative  Respiratory:       Per HPI, otherwise negative  Cardiovascular:       Per HPI, otherwise negative  Gastrointestinal: Negative for abdominal pain, nausea and vomiting.       Loose stool  Endocrine:       Negative aside from HPI  Genitourinary:       Neg aside from HPI   Musculoskeletal:       Per HPI, otherwise negative  Skin: Negative.   Neurological: Negative for syncope.     Physical Exam Updated Vital Signs BP 103/73 (BP Location: Left Arm)   Pulse 76   Temp (!) 97.5 F (36.4 C) (Oral)   Resp 17   LMP 05/28/2018 (LMP Unknown)   SpO2 93%   Physical Exam Vitals signs and nursing note reviewed.  Constitutional:      General: She is not in acute distress.    Appearance: She is well-developed.  HENT:     Head: Normocephalic and atraumatic.  Eyes:     Conjunctiva/sclera: Conjunctivae normal.  Cardiovascular:     Rate and Rhythm: Normal rate and regular rhythm.  Pulmonary:     Effort: Pulmonary effort is normal. No respiratory distress.     Breath sounds: Normal breath sounds. No stridor.  Abdominal:     General: There is no distension.  Skin:    General: Skin is warm and dry.  Neurological:     Mental Status: She is alert and oriented to person, place, and time.     Cranial Nerves: No cranial nerve deficit.     Motor: Atrophy present.  Psychiatric:        Mood and Affect: Affect is flat.        Thought Content: Thought content includes suicidal ideation. Thought content does not include suicidal plan.        Cognition and Memory: Cognition is impaired.      ED Treatments / Results  Labs (all labs  ordered are listed, but only abnormal results are displayed) Labs Reviewed  URINALYSIS, ROUTINE W REFLEX MICROSCOPIC - Abnormal; Notable for the following components:  Result Value   APPearance CLOUDY (*)    Hgb urine dipstick SMALL (*)    Leukocytes,Ua LARGE (*)    WBC, UA >50 (*)    Bacteria, UA MANY (*)    All other components within normal limits  RAPID URINE DRUG SCREEN, HOSP PERFORMED  LIPASE, BLOOD  COMPREHENSIVE METABOLIC PANEL  CBC  ETHANOL  SALICYLATE LEVEL  ACETAMINOPHEN LEVEL  I-STAT BETA HCG BLOOD, ED ([facility], [facility], [facility] ONLY)     Procedures Procedures (including critical care time)  Medications Ordered in ED Medications - No data to display   Initial Impression / Assessment and Plan / ED Course  I have reviewed the triage vital signs and the nursing notes.  Pertinent labs & imaging results that were available during my care of the patient were reviewed by me and considered in my medical decision making (see chart for details).  4:10 PM Patient in no distress.  Patient's vitals reassuring, no substantial tachycardia, blood pressure only moderately borderline Patient does describe some loose stool, but has no evidence for acute abdominal processes. Labs generally reassuring, though initial findings suggest possible UTI. Given that she is a very poor historian, has had loose stool, and there is some consideration of contamination from feces, patient will receive single dose of antibiotic for possible UTI.  When she is otherwise medically clear for psychiatric evaluation.  Final Clinical Impressions(s) / ED Diagnoses  Suicidal ideation   Gerhard Munch, MD 06/09/18 1610

## 2018-06-10 DIAGNOSIS — F4325 Adjustment disorder with mixed disturbance of emotions and conduct: Secondary | ICD-10-CM

## 2018-06-10 NOTE — Consult Note (Addendum)
[facility] Psych ED Discharge  06/10/2018 12:17 PM [name]  MRN:  [id number] Principal Problem: Adjustment disorder with mixed disturbance of emotions and conduct Discharge Diagnoses: Principal Problem:   Adjustment disorder with mixed disturbance of emotions and conduct Active Problems:   Borderline personality disorder (HCC)   Schizoaffective disorder, bipolar type (HCC)  Subjective: Patient got upset with her sister-in-law and threatened to her herself. She does not endorse SI, HI or AVH at this time. She is resting comfortably in bed.   Total Time spent with patient: 45 minutes  Past Psychiatric History: schizoaffective disorder  Past Medical History:  Past Medical History:  Diagnosis Date  . Anxiety   . Asthma   . Borderline personality disorder (HCC)   . Cerebral palsy (HCC)   . COPD (chronic obstructive pulmonary disease) (HCC)   . Depression   . GERD (gastroesophageal reflux disease)   . Mild cognitive impairment   . Schizoaffective disorder (HCC)   . Wound abscess     Past Surgical History:  Procedure Laterality Date  . CHOLECYSTECTOMY    . INCISION AND DRAINAGE ABSCESS Right 01/02/2015   Procedure: INCISION AND DRAINAGE ABSCESS;  Surgeon: Tiney Rouge III, MD;  Location: [facility] ORS;  Service: General;  Laterality: Right;  . TONSILLECTOMY     Family History:  Family History  Problem Relation Age of Onset  . Heart attack Father   . Diabetes Mother    Family Psychiatric  History: none Social History:  Social History   Substance and Sexual Activity  Alcohol Use No     Social History   Substance and Sexual Activity  Drug Use No    Social History   Socioeconomic History  . Marital status: Single    Spouse name: Not on file  . Number of children: Not on file  . Years of education: Not on file  . Highest education level: Not on file  Occupational History  . Not on file  Social Needs  . Financial resource strain: Not on file  . Food insecurity:     Worry: Not on file    Inability: Not on file  . Transportation needs:    Medical: Not on file    Non-medical: Not on file  Tobacco Use  . Smoking status: Former Games developer  . Smokeless tobacco: Never Used  Substance and Sexual Activity  . Alcohol use: No  . Drug use: No  . Sexual activity: Not Currently  Lifestyle  . Physical activity:    Days per week: Not on file    Minutes per session: Not on file  . Stress: Not on file  Relationships  . Social connections:    Talks on phone: Not on file    Gets together: Not on file    Attends religious service: Not on file    Active member of club or organization: Not on file    Attends meetings of clubs or organizations: Not on file    Relationship status: Not on file  Other Topics Concern  . Not on file  Social History Narrative  . Not on file    Has this patient used any form of tobacco in the last 30 days? (Cigarettes, Smokeless Tobacco, Cigars, and/or Pipes) NA  Current Medications: Current Facility-Administered Medications  Medication Dose Route Frequency Provider Last Rate Last Dose  . aspirin EC tablet 81 mg  81 mg Oral Daily Gerhard Munch, MD   81 mg at 06/10/18 1158  . clonazePAM (KLONOPIN) tablet 0.5 mg  0.5 mg Oral BID Gerhard Munch, MD   0.5 mg at 06/10/18 1005  . divalproex (DEPAKOTE) DR tablet 500 mg  500 mg Oral BID Gerhard Munch, MD   500 mg at 06/10/18 1006  . escitalopram (LEXAPRO) tablet 20 mg  20 mg Oral Daily Gerhard Munch, MD   20 mg at 06/10/18 1005  . fenofibrate tablet 160 mg  160 mg Oral Daily Gerhard Munch, MD   160 mg at 06/10/18 1006  . hydrOXYzine (ATARAX/VISTARIL) tablet 25 mg  25 mg Oral Q8H PRN Gerhard Munch, MD      . levothyroxine (SYNTHROID, LEVOTHROID) tablet 50 mcg  50 mcg Oral QAC breakfast Gerhard Munch, MD   50 mcg at 06/10/18 [phone]  . oxybutynin (DITROPAN-XL) 24 hr tablet 5 mg  5 mg Oral Daily Gerhard Munch, MD   5 mg at 06/10/18 1006  . pantoprazole (PROTONIX) EC tablet 40  mg  40 mg Oral Daily Gerhard Munch, MD   40 mg at 06/10/18 1005  . risperiDONE (RISPERDAL) tablet 3 mg  3 mg Oral BID Gerhard Munch, MD   3 mg at 06/10/18 1005  . sucralfate (CARAFATE) tablet 1 g  1 g Oral TID WC & HS Gerhard Munch, MD   1 g at 06/09/18 2238   Current Outpatient Medications  Medication Sig Dispense Refill  . aspirin EC 81 MG tablet Take 1 tablet (81 mg total) by mouth daily. 30 tablet 1  . clonazePAM (KLONOPIN) 0.5 MG tablet Take 1 tablet (0.5 mg total) by mouth 2 (two) times daily. 60 tablet 1  . divalproex (DEPAKOTE) 500 MG DR tablet Take 1 tablet (500 mg total) by mouth 2 (two) times daily. 60 tablet 1  . escitalopram (LEXAPRO) 20 MG tablet Take 1 tablet (20 mg total) by mouth daily. 30 tablet 1  . fenofibrate 160 MG tablet Take 1 tablet (160 mg total) by mouth daily. 30 tablet 1  . hydrOXYzine (ATARAX/VISTARIL) 25 MG tablet Take 1 tablet (25 mg total) by mouth every 6 (six) hours as needed for anxiety. (Patient taking differently: Take 25 mg by mouth 2 (two) times daily. ) 12 tablet 0  . levothyroxine (SYNTHROID, LEVOTHROID) 50 MCG tablet Take 1 tablet (50 mcg total) by mouth daily before breakfast. 30 tablet 1  . oxybutynin (DITROPAN-XL) 5 MG 24 hr tablet Take 1 tablet (5 mg total) by mouth daily. 30 tablet 1  . pantoprazole (PROTONIX) 40 MG tablet Take 1 tablet (40 mg total) by mouth daily. 30 tablet 1  . risperiDONE (RISPERDAL) 3 MG tablet Take 1 tablet (3 mg total) by mouth 2 (two) times daily. (Patient taking differently: Take 3 mg by mouth daily. ) 60 tablet 1  . sucralfate (CARAFATE) 1 g tablet Take 1 tablet (1 g total) by mouth 4 (four) times daily -  with meals and at bedtime. (Patient taking differently: Take 1 g by mouth daily. ) 120 tablet 1   PTA Medications: (Not in a hospital admission)   Musculoskeletal: Strength & Muscle Tone: within normal limits Gait & Station: normal Patient leans: N/A  Psychiatric Specialty Exam: Physical Exam  Nursing  note and vitals reviewed. Constitutional: She is oriented to person, place, and time. She appears well-developed and well-nourished.  HENT:  Head: Normocephalic.  Neck: Normal range of motion.  Respiratory: Effort normal.  Musculoskeletal: Normal range of motion.  Neurological: She is alert and oriented to person, place, and time.  Psychiatric: Her speech is normal and behavior is normal. Judgment and  thought content normal. Her mood appears anxious. Her affect is blunt. Cognition and memory are impaired.    Review of Systems  Psychiatric/Behavioral: The patient is nervous/anxious.   All other systems reviewed and are negative.   Blood pressure 95/67, pulse 63, temperature 98.6 F (37 C), temperature source Oral, resp. rate 18, last menstrual period 05/28/2018, SpO2 95 %.There is no height or weight on file to calculate BMI.  General Appearance: Casual  Eye Contact:  Good  Speech:  Normal Rate  Volume:  Normal  Mood:  Anxious  Affect:  Blunt  Thought Process:  Coherent and Descriptions of Associations: Intact  Orientation:  Full (Time, Place, and Person)  Thought Content:  WDL and Logical  Suicidal Thoughts:  No  Homicidal Thoughts:  No  Memory:  Immediate;   Good Recent;   Good Remote;   Good  Judgement:  Poor  Insight:  Fair  Psychomotor Activity:  Normal  Concentration:  Concentration: Good and Attention Span: Good  Recall:  [employer] of Knowledge:  Fair  Language:  Good  Akathisia:  No  Handed:  Right  AIMS (if indicated):   N/A  Assets:  Housing Leisure Time Physical Health Resilience Social Support  ADL's:  Intact  Cognition:  WNL  Sleep:   N/A     Demographic Factors:  Caucasian  Loss Factors: NA  Historical Factors: Impulsivity  Risk Reduction Factors:   Sense of responsibility to family, Living with another person, especially a relative, Positive social support and Positive therapeutic relationship  Continued Clinical Symptoms:  Anxiety,  mild  Cognitive Features That Contribute To Risk:  None    Suicide Risk:  Minimal: No identifiable suicidal ideation.  Patients presenting with no risk factors but with morbid ruminations; may be classified as minimal risk based on the severity of the depressive symptoms   Plan Of Care/Follow-up recommendations:  Adjustment disorder with mixed disturbance of emotions and conduct: -Restarted Lexapro 20 mg daily -Restarted Depakote 500 mg BID -Restarted Klonopin 0.5 mg BID -Restarted Vistaril 25 mg every 8 hours PRN anxiety -Continued Risperdal 3 mg BID -Contacted her ACT team and care coordinator for update and coordination of care Activity:  as tolerated Diet:  heart healthy diet  Disposition: discharge home Nanine Means, NP 06/10/2018, 12:17 PM   Patient seen face-to-face for psychiatric evaluation, chart reviewed and case discussed with the physician extender and developed treatment plan. Reviewed the information documented and agree with the treatment plan.  [name], DO 06/10/18 4:56 PM

## 2018-06-10 NOTE — Progress Notes (Signed)
CSW spoke with [name] in TTS and was informed that pt is ready to discharge back to family. CSW reached out to pt's mother listed in chart and was informed that she isn't coming to get pt. Mother expressed "she does this crap all the time and yall fall for it so y'all can keep her there or find her a way home. Bye". CSW spoke with [name] to see if pt is appropriate for taxi. CSW has provided taxi to [place] via fax. No further CSW needs as mother expressed that pt has a key to get into the home. CSW will sign off.     Claude Manges. Andreika Vandagriff, MSW, LCSW-A Emergency Department Clinical Social Worker [phone]

## 2018-06-10 NOTE — ED Notes (Signed)
Patient does not want to go home and says that she wants to call 911 and tell them to take her to [facility] [place]

## 2018-06-10 NOTE — ED Notes (Signed)
psych Provider at bedside. 

## 2018-06-10 NOTE — ED Notes (Signed)
Pt slept all shift, no needs voiced. Pt woke to take meds and then went back to sleep.

## 2018-06-10 NOTE — ED Notes (Signed)
Patient said she called someone for a ride home and a NT walked her to the lobby to wait for her ride

## 2018-06-10 NOTE — ED Notes (Signed)
Patient is being discharged home.  Social work will be calling a taxi service to transport patient to her home.  Cont to community services that she had been using previously

## 2018-06-10 NOTE — ED Notes (Signed)
Psychotherapeutic services community support team [phone]

## 2018-06-10 NOTE — Discharge Instructions (Signed)
For your behavioral health needs, you are advised to continue treatment with the [facility] Community Support Team: ° °     Psychotherapeutic Services Community Support Team °     [facility], [address] °     [address] °     [place], [place]  [address] °     [phone] °     Crisis number: [phone] °

## 2018-06-10 NOTE — BH Assessment (Signed)
[facility] Assessment Progress Note  PerJacqueline Norman, DO, this pt does not require psychiatric hospitalization at this time.Pt is to be discharged from [facility] with recommendation to continue treatment with the [facility] Community Support Team.This has been included in pt's discharge instructions.CSW will be asked to facilitate pt's return to the community.Pt's nurse, Marchelle Folks, has been notified.  Doylene Canning, MA Triage Specialist [phone]

## 2018-06-11 ENCOUNTER — Emergency Department (HOSPITAL_COMMUNITY)
Admission: EM | Admit: 2018-06-11 | Discharge: 2018-06-11 | Disposition: A | Discharge status: Home / Self Care | Payer: Medicare Other | Attending: Emergency Medicine | Admitting: Emergency Medicine

## 2018-06-11 ENCOUNTER — Emergency Department (HOSPITAL_COMMUNITY): Payer: Medicare Other

## 2018-06-11 ENCOUNTER — Other Ambulatory Visit: Payer: Self-pay

## 2018-06-11 ENCOUNTER — Encounter (HOSPITAL_COMMUNITY): Payer: Self-pay

## 2018-06-11 DIAGNOSIS — R079 Chest pain, unspecified: Secondary | ICD-10-CM | POA: Diagnosis present

## 2018-06-11 DIAGNOSIS — K219 Gastro-esophageal reflux disease without esophagitis: Secondary | ICD-10-CM | POA: Diagnosis not present

## 2018-06-11 DIAGNOSIS — J449 Chronic obstructive pulmonary disease, unspecified: Secondary | ICD-10-CM | POA: Insufficient documentation

## 2018-06-11 DIAGNOSIS — Z87891 Personal history of nicotine dependence: Secondary | ICD-10-CM | POA: Insufficient documentation

## 2018-06-11 DIAGNOSIS — Z7982 Long term (current) use of aspirin: Secondary | ICD-10-CM | POA: Diagnosis not present

## 2018-06-11 DIAGNOSIS — Z79899 Other long term (current) drug therapy: Secondary | ICD-10-CM | POA: Diagnosis not present

## 2018-06-11 LAB — CBC WITH DIFFERENTIAL/PLATELET
Abs Immature Granulocytes: 0.04 K/uL (ref 0.00–0.07)
Basophils Absolute: 0.1 K/uL (ref 0.0–0.1)
Basophils Relative: 1 %
Eosinophils Absolute: 0.2 K/uL (ref 0.0–0.5)
Eosinophils Relative: 2 %
HCT: 40.4 % (ref 36.0–46.0)
Hemoglobin: 12.5 g/dL (ref 12.0–15.0)
Immature Granulocytes: 1 %
Lymphocytes Relative: 30 %
Lymphs Abs: 2.1 K/uL (ref 0.7–4.0)
MCH: 31.6 pg (ref 26.0–34.0)
MCHC: 30.9 g/dL (ref 30.0–36.0)
MCV: 102.3 fL — ABNORMAL HIGH (ref 80.0–100.0)
Monocytes Absolute: 0.7 K/uL (ref 0.1–1.0)
Monocytes Relative: 10 %
Neutro Abs: 4 K/uL (ref 1.7–7.7)
Neutrophils Relative %: 56 %
Platelets: 285 K/uL (ref 150–400)
RBC: 3.95 MIL/uL (ref 3.87–5.11)
RDW: 13.2 % (ref 11.5–15.5)
WBC: 7.1 K/uL (ref 4.0–10.5)
nRBC: 0 % (ref 0.0–0.2)

## 2018-06-11 LAB — BASIC METABOLIC PANEL WITH GFR
Anion gap: 9 (ref 5–15)
BUN: 20 mg/dL (ref 6–20)
CO2: 25 mmol/L (ref 22–32)
Calcium: 9.1 mg/dL (ref 8.9–10.3)
Chloride: 107 mmol/L (ref 98–111)
Creatinine, Ser: 0.69 mg/dL (ref 0.44–1.00)
GFR calc Af Amer: >60 mL/min
GFR calc non Af Amer: >60 mL/min
Glucose, Bld: 104 mg/dL — ABNORMAL HIGH (ref 70–99)
Potassium: 4.4 mmol/L (ref 3.5–5.1)
Sodium: 141 mmol/L (ref 135–145)

## 2018-06-11 LAB — I-STAT TROPONIN, ED: Troponin i, poc: 0 ng/mL (ref 0.00–0.08)

## 2018-06-11 MED ORDER — FAMOTIDINE 20 MG PO TABS: 20.0000 mg | ORAL_TABLET | Freq: Two times a day (BID) | ORAL | 0 refills | Status: DC

## 2018-06-11 MED ORDER — FAMOTIDINE IN NACL 20-0.9 MG/50ML-% IV SOLN
20.0000 mg | Freq: Once | INTRAVENOUS | Status: AC
  Administered 2018-06-11: 20 mg via INTRAVENOUS
  Filled 2018-06-11: qty 50

## 2018-06-11 NOTE — ED Triage Notes (Signed)
Pt presents for evaluation of GERD. Reports this has been going on for several years, reports this flare started 3 months ago.

## 2018-06-11 NOTE — Discharge Instructions (Signed)
Return to the ED for worsening symptoms, worsening chest pain, shortness of breath, vomiting or coughing up blood or leg swelling.

## 2018-06-11 NOTE — ED Notes (Signed)
Pt returned, now complaining of chest pain. Will do EKG.

## 2018-06-11 NOTE — ED Notes (Signed)
Pt given turkey sandwich and diet coke  

## 2018-06-11 NOTE — ED Notes (Signed)
Pt left without being seen.

## 2018-06-11 NOTE — ED Notes (Signed)
Patient verbalizes understanding of discharge instructions. Opportunity for questioning and answers were provided. Armband removed by staff, pt discharged from ED.  

## 2018-06-11 NOTE — ED Provider Notes (Signed)
[facility] EMERGENCY DEPARTMENT Provider Note   CSN: [id number] Arrival date & time: 06/11/18  1441    History   Chief Complaint Chief Complaint  Patient presents with  . Gastroesophageal Reflux    HPI [name] is a 53 y.o. female with a past medical history of GERD, COPD, cerebral palsy, asthma, anxiety, schizoaffective disorder who presents to ED for chest pain.  States that she had chest pain when she woke up.  Describes it as burning.  Pain got worse when she went to [facility] and ate a meal there.  Reports associated shortness of breath.  Has not had these symptoms before.  Has not taken any medications to help with symptoms.  Patient states that "I went to [place] [facility] 2 days ago, I was suicidal but today I am not suicidal."     HPI  Past Medical History:  Diagnosis Date  . Anxiety   . Asthma   . Borderline personality disorder (HCC)   . Cerebral palsy (HCC)   . COPD (chronic obstructive pulmonary disease) (HCC)   . Depression   . GERD (gastroesophageal reflux disease)   . Mild cognitive impairment   . Schizoaffective disorder (HCC)   . Wound abscess     Patient Active Problem List   Diagnosis Date Noted  . 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 Overdose by acetaminophen 08/08/2016  . Schizoaffective disorder, bipolar type (HCC)   . 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 GERD (gastroesophageal reflux disease) 09/24/2014  . COPD (chronic obstructive pulmonary disease) (HCC) 09/24/2014  . Borderline personality disorder (HCC) 09/24/2014  . Cerebral palsy (HCC) 08/31/2014    Past Surgical History:  Procedure Laterality Date  . CHOLECYSTECTOMY    . INCISION AND DRAINAGE ABSCESS Right 01/02/2015   Procedure: INCISION AND DRAINAGE ABSCESS;  Surgeon: Tiney Rouge III, MD;  Location: [facility] ORS;  Service: General;  Laterality: Right;  . TONSILLECTOMY       OB History    Gravida  0   Para  0   Term  0   Preterm  0   AB  0   Living  0     SAB  0   TAB  0   Ectopic  0   Multiple  0   Live Births               Home Medications    Prior to Admission medications   Medication Sig Start Date End Date Taking? Authorizing Provider  aspirin EC 81 MG tablet Take 1 tablet (81 mg total) by mouth daily. 12/14/17   Clapacs, Jackquline Denmark, MD  clonazePAM (KLONOPIN) 0.5 MG tablet Take 1 tablet (0.5 mg total) by mouth 2 (two) times daily. 12/14/17   Clapacs, Jackquline Denmark, MD  divalproex (DEPAKOTE) 500 MG DR tablet Take 1 tablet (500 mg total) by mouth 2 (two) times daily. 12/14/17   Clapacs, Jackquline Denmark, MD  escitalopram (LEXAPRO) 20 MG tablet Take 1 tablet (20 mg total) by mouth daily. 12/14/17   Clapacs, Jackquline Denmark, MD  famotidine (PEPCID) 20 MG tablet Take 1 tablet (20 mg total) by mouth 2 (two) times daily. 06/11/18   Jasyah Theurer, PA-C  fenofibrate 160 MG tablet Take 1 tablet (160 mg total) by mouth daily. 12/14/17   Clapacs, Jackquline Denmark, MD  hydrOXYzine (ATARAX/VISTARIL) 25 MG tablet Take 1 tablet (25 mg total) by mouth every 6 (six) hours as needed for anxiety. Patient taking differently: Take  25 mg by mouth 2 (two) times daily.  05/11/18   Fayrene Helper, PA-C  levothyroxine (SYNTHROID, LEVOTHROID) 50 MCG tablet Take 1 tablet (50 mcg total) by mouth daily before breakfast. 12/14/17   Clapacs, Jackquline Denmark, MD  oxybutynin (DITROPAN-XL) 5 MG 24 hr tablet Take 1 tablet (5 mg total) by mouth daily. 12/14/17   Clapacs, Jackquline Denmark, MD  pantoprazole (PROTONIX) 40 MG tablet Take 1 tablet (40 mg total) by mouth daily. 12/14/17   Clapacs, Jackquline Denmark, MD  risperiDONE (RISPERDAL) 3 MG tablet Take 1 tablet (3 mg total) by mouth 2 (two) times daily. Patient taking differently: Take 3 mg by mouth daily.  12/14/17   Clapacs, Jackquline Denmark, MD  sucralfate (CARAFATE) 1 g tablet Take 1 tablet (1 g total) by mouth 4 (four) times daily -  with meals and at bedtime. Patient taking differently: Take 1 g by mouth daily.  12/14/17   Clapacs, Jackquline Denmark, MD     Family History Family History  Problem Relation Age of Onset  . Heart attack Father   . Diabetes Mother     Social History Social History   Tobacco Use  . Smoking status: Former Games developer  . Smokeless tobacco: Never Used  Substance Use Topics  . Alcohol use: No  . Drug use: No     Allergies   Penicillins   Review of Systems Review of Systems  Constitutional: Negative for appetite change, chills and fever.  HENT: Negative for ear pain, rhinorrhea, sneezing and sore throat.   Eyes: Negative for photophobia and visual disturbance.  Respiratory: Negative for cough, chest tightness, shortness of breath and wheezing.   Cardiovascular: Positive for chest pain. Negative for palpitations.  Gastrointestinal: Negative for abdominal pain, blood in stool, constipation, diarrhea, nausea and vomiting.  Genitourinary: Negative for dysuria, hematuria and urgency.  Musculoskeletal: Negative for myalgias.  Skin: Negative for rash.  Neurological: Negative for dizziness, weakness and light-headedness.     Physical Exam Updated Vital Signs BP (!) 147/90   Pulse 84   Temp 98.2 F (36.8 C) (Oral)   Resp 16   Ht 5\' 3"  (1.6 m)   Wt 91.6 kg   LMP 05/28/2018 (LMP Unknown)   SpO2 100%   BMI 35.78 kg/m   Physical Exam Vitals signs and nursing note reviewed.  Constitutional:      General: She is not in acute distress.    Appearance: She is well-developed.  HENT:     Head: Normocephalic and atraumatic.     Nose: Nose normal.  Eyes:     General: No scleral icterus.       Left eye: No discharge.     Conjunctiva/sclera: Conjunctivae normal.  Neck:     Musculoskeletal: Normal range of motion and neck supple.  Cardiovascular:     Rate and Rhythm: Normal rate and regular rhythm.     Heart sounds: Normal heart sounds. No murmur. No friction rub. No gallop.   Pulmonary:     Effort: Pulmonary effort is normal. No respiratory distress.     Breath sounds: Normal breath sounds.   Abdominal:     General: Bowel sounds are normal. There is no distension.     Palpations: Abdomen is soft.     Tenderness: There is no abdominal tenderness. There is no guarding.  Musculoskeletal: Normal range of motion.     Comments: No lower extremity edema, erythema or calf tenderness bilaterally.  Skin:    General: Skin is warm and dry.  Findings: No rash.  Neurological:     Mental Status: She is alert.     Motor: No abnormal muscle tone.     Coordination: Coordination normal.      ED Treatments / Results  Labs (all labs ordered are listed, but only abnormal results are displayed) Labs Reviewed  CBC WITH DIFFERENTIAL/PLATELET - Abnormal; Notable for the following components:      Result Value   MCV 102.3 (*)    All other components within normal limits  BASIC METABOLIC PANEL - Abnormal; Notable for the following components:   Glucose, Bld 104 (*)    All other components within normal limits  I-STAT TROPONIN, ED    EKG EKG Interpretation  Date/Time:  Tuesday June 11 2018 15:54:46 EST Ventricular Rate:  69 PR Interval:  188 QRS Duration: 78 QT Interval:  420 QTC Calculation: 450 R Axis:   26 Text Interpretation:  Sinus rhythm with frequent Premature ventricular complexes Otherwise normal ECG No significant change since last tracing Confirmed by Gwyneth Sprout ([id number]) on 06/11/2018 5:07:24 PM   Radiology Dg Chest 2 View  Result Date: 06/11/2018 CLINICAL DATA:  Chest pain, shortness of breath EXAM: CHEST - 2 VIEW COMPARISON:  05/29/2018 FINDINGS: Heart and mediastinal contours are within normal limits. No focal opacities or effusions. No acute bony abnormality. IMPRESSION: No active cardiopulmonary disease. Electronically Signed   By: Charlett Nose M.D.   On: 06/11/2018 18:39    Procedures Procedures (including critical care time)  Medications Ordered in ED Medications  famotidine (PEPCID) IVPB 20 mg premix (0 mg Intravenous Stopped 06/11/18 1849)      Initial Impression / Assessment and Plan / ED Course  I have reviewed the triage vital signs and the nursing notes.  Pertinent labs & imaging results that were available during my care of the patient were reviewed by me and considered in my medical decision making (see chart for details).        52 year old female presents to ED for chest pain, shortness of breath.  Symptoms began this morning but worsened after she ate a meal at [facility].  Denies any vomiting.  On my exam there is no lower extremity edema, erythema or calf tenderness or concerning for DVT.  Abdomen is soft, nontender nondistended.  She is overall well-appearing febrile signs are within normal limits, she is not tachycardic, tachypneic or hypoxic.  CBC, BMP, troponin unremarkable.  EKG shows sinus rhythm with no significant changes from prior tracings.  Chest x-ray is unremarkable.  She was given Pepcid with improvement in her symptoms.  Doubt ACS as the cause of her symptoms, doubt PE based on Wells criteria.  I suspect that her symptoms are due to her GERD, which she has a history of.  She will be given Pepcid to take at home as needed and will return to the ED for any severe worsening symptoms.   Patient is hemodynamically stable, in NAD, and able to ambulate in the ED. Evaluation does not show pathology that would require ongoing emergent intervention or inpatient treatment. I explained the diagnosis to the patient. Pain has been managed and has no complaints prior to discharge. Patient is comfortable with above plan and is stable for discharge at this time. All questions were answered prior to disposition. Strict return precautions for returning to the ED were discussed. Encouraged follow up with PCP.    Portions of this note were generated with Scientist, clinical (histocompatibility and immunogenetics). Dictation errors may occur despite best attempts at proofreading.  Final Clinical Impressions(s) / ED Diagnoses   Final diagnoses:  Gastroesophageal  reflux disease, esophagitis presence not specified    ED Discharge Orders         Ordered    famotidine (PEPCID) 20 MG tablet  2 times daily     06/11/18 1939           Dietrich Pates, Cordelia Poche 06/11/18 2342    Gwyneth Sprout, MD 06/12/18 2138

## 2018-06-12 ENCOUNTER — Ambulatory Visit
Admission: RE | Admit: 2018-06-12 | Discharge: 2018-06-12 | Disposition: A | Discharge status: Home / Self Care | Payer: Medicare Other | Source: Ambulatory Visit | Attending: Family Medicine | Admitting: Family Medicine

## 2018-06-12 DIAGNOSIS — Z1231 Encounter for screening mammogram for malignant neoplasm of breast: Secondary | ICD-10-CM | POA: Insufficient documentation

## 2018-06-16 ENCOUNTER — Emergency Department (HOSPITAL_COMMUNITY)
Admission: EM | Admit: 2018-06-16 | Discharge: 2018-06-17 | Disposition: A | Discharge status: Home / Self Care | Payer: Medicare Other | Attending: Emergency Medicine | Admitting: Emergency Medicine

## 2018-06-16 ENCOUNTER — Other Ambulatory Visit: Payer: Self-pay

## 2018-06-16 ENCOUNTER — Encounter (HOSPITAL_COMMUNITY): Payer: Self-pay | Admitting: Emergency Medicine

## 2018-06-16 DIAGNOSIS — F250 Schizoaffective disorder, bipolar type: Secondary | ICD-10-CM | POA: Diagnosis not present

## 2018-06-16 DIAGNOSIS — M791 Myalgia, unspecified site: Secondary | ICD-10-CM | POA: Insufficient documentation

## 2018-06-16 DIAGNOSIS — Z79899 Other long term (current) drug therapy: Secondary | ICD-10-CM | POA: Diagnosis not present

## 2018-06-16 DIAGNOSIS — F4325 Adjustment disorder with mixed disturbance of emotions and conduct: Secondary | ICD-10-CM | POA: Diagnosis present

## 2018-06-16 DIAGNOSIS — F333 Major depressive disorder, recurrent, severe with psychotic symptoms: Secondary | ICD-10-CM | POA: Diagnosis not present

## 2018-06-16 DIAGNOSIS — R630 Anorexia: Secondary | ICD-10-CM | POA: Diagnosis present

## 2018-06-16 DIAGNOSIS — Z87891 Personal history of nicotine dependence: Secondary | ICD-10-CM | POA: Diagnosis not present

## 2018-06-16 DIAGNOSIS — R45851 Suicidal ideations: Secondary | ICD-10-CM | POA: Diagnosis not present

## 2018-06-16 DIAGNOSIS — R0789 Other chest pain: Secondary | ICD-10-CM | POA: Insufficient documentation

## 2018-06-16 DIAGNOSIS — J449 Chronic obstructive pulmonary disease, unspecified: Secondary | ICD-10-CM | POA: Insufficient documentation

## 2018-06-16 DIAGNOSIS — R0602 Shortness of breath: Secondary | ICD-10-CM | POA: Insufficient documentation

## 2018-06-16 DIAGNOSIS — Z7982 Long term (current) use of aspirin: Secondary | ICD-10-CM | POA: Diagnosis not present

## 2018-06-16 DIAGNOSIS — R45850 Homicidal ideations: Secondary | ICD-10-CM | POA: Insufficient documentation

## 2018-06-16 DIAGNOSIS — J45909 Unspecified asthma, uncomplicated: Secondary | ICD-10-CM | POA: Diagnosis not present

## 2018-06-16 DIAGNOSIS — F603 Borderline personality disorder: Secondary | ICD-10-CM | POA: Diagnosis present

## 2018-06-16 LAB — RAPID URINE DRUG SCREEN, HOSP PERFORMED
AMPHETAMINES: NOT DETECTED
BENZODIAZEPINES: NOT DETECTED
Barbiturates: NOT DETECTED
Cocaine: NOT DETECTED
Opiates: NOT DETECTED
Tetrahydrocannabinol: NOT DETECTED

## 2018-06-16 LAB — CBC
HCT: 38.5 % (ref 36.0–46.0)
Hemoglobin: 12.4 g/dL (ref 12.0–15.0)
MCH: 33.1 pg (ref 26.0–34.0)
MCHC: 32.2 g/dL (ref 30.0–36.0)
MCV: 102.7 fL — ABNORMAL HIGH (ref 80.0–100.0)
Platelets: 265 10*3/uL (ref 150–400)
RBC: 3.75 MIL/uL — ABNORMAL LOW (ref 3.87–5.11)
RDW: 13.3 % (ref 11.5–15.5)
WBC: 8.5 10*3/uL (ref 4.0–10.5)
nRBC: 0 % (ref 0.0–0.2)

## 2018-06-16 LAB — COMPREHENSIVE METABOLIC PANEL
ALT: 19 U/L (ref 0–44)
AST: 23 U/L (ref 15–41)
Albumin: 3.9 g/dL (ref 3.5–5.0)
Alkaline Phosphatase: 41 U/L (ref 38–126)
Anion gap: 6 (ref 5–15)
BUN: 26 mg/dL — ABNORMAL HIGH (ref 6–20)
CO2: 27 mmol/L (ref 22–32)
CREATININE: 0.7 mg/dL (ref 0.44–1.00)
Calcium: 9.2 mg/dL (ref 8.9–10.3)
Chloride: 108 mmol/L (ref 98–111)
GFR calc Af Amer: >60 mL/min (ref >60)
GFR calc non Af Amer: >60 mL/min (ref >60)
Glucose, Bld: 92 mg/dL (ref 70–99)
Potassium: 3.8 mmol/L (ref 3.5–5.1)
Sodium: 141 mmol/L (ref 135–145)
Total Bilirubin: 0.2 mg/dL — ABNORMAL LOW (ref 0.3–1.2)
Total Protein: 7.2 g/dL (ref 6.5–8.1)

## 2018-06-16 LAB — ACETAMINOPHEN LEVEL: Acetaminophen (Tylenol), Serum: <10 ug/mL — ABNORMAL LOW (ref 10–30)

## 2018-06-16 LAB — SALICYLATE LEVEL: Salicylate Lvl: <7 mg/dL (ref 2.8–30.0)

## 2018-06-16 LAB — ETHANOL: Alcohol, Ethyl (B): <10 mg/dL (ref <10)

## 2018-06-16 MED ORDER — PANTOPRAZOLE SODIUM 40 MG PO TBEC
40.0000 mg | DELAYED_RELEASE_TABLET | Freq: Every day | ORAL | Status: DC
  Administered 2018-06-16 – 2018-06-17 (×2): 40 mg via ORAL
  Filled 2018-06-16 (×2): qty 1

## 2018-06-16 MED ORDER — ESCITALOPRAM OXALATE 10 MG PO TABS
20.0000 mg | ORAL_TABLET | Freq: Every day | ORAL | Status: DC
  Administered 2018-06-16 – 2018-06-17 (×2): 20 mg via ORAL
  Filled 2018-06-16 (×2): qty 2

## 2018-06-16 MED ORDER — DIVALPROEX SODIUM 500 MG PO DR TAB
500.0000 mg | DELAYED_RELEASE_TABLET | Freq: Two times a day (BID) | ORAL | Status: DC
  Administered 2018-06-16 – 2018-06-17 (×2): 500 mg via ORAL
  Filled 2018-06-16 (×2): qty 1

## 2018-06-16 MED ORDER — ACETAMINOPHEN 325 MG PO TABS: 650.0000 mg | ORAL_TABLET | ORAL | Status: DC | PRN

## 2018-06-16 MED ORDER — CLONAZEPAM 0.5 MG PO TABS
0.5000 mg | ORAL_TABLET | Freq: Two times a day (BID) | ORAL | Status: DC
  Administered 2018-06-16 – 2018-06-17 (×2): 0.5 mg via ORAL
  Filled 2018-06-16 (×2): qty 1

## 2018-06-16 MED ORDER — ALUM & MAG HYDROXIDE-SIMETH 200-200-20 MG/5ML PO SUSP: 30.0000 mL | Freq: Four times a day (QID) | ORAL | Status: DC | PRN

## 2018-06-16 MED ORDER — ZOLPIDEM TARTRATE 5 MG PO TABS
5.0000 mg | ORAL_TABLET | Freq: Every evening | ORAL | Status: DC | PRN
  Administered 2018-06-16: 5 mg via ORAL
  Filled 2018-06-16: qty 1

## 2018-06-16 MED ORDER — LEVOTHYROXINE SODIUM 50 MCG PO TABS
50.0000 ug | ORAL_TABLET | Freq: Every day | ORAL | Status: DC
  Administered 2018-06-17: 50 ug via ORAL
  Filled 2018-06-16: qty 1

## 2018-06-16 MED ORDER — SUCRALFATE 1 G PO TABS
1.0000 g | ORAL_TABLET | Freq: Three times a day (TID) | ORAL | Status: DC
  Administered 2018-06-17 (×2): 1 g via ORAL
  Filled 2018-06-16 (×3): qty 1

## 2018-06-16 MED ORDER — ONDANSETRON HCL 4 MG PO TABS: 4.0000 mg | ORAL_TABLET | Freq: Three times a day (TID) | ORAL | Status: DC | PRN

## 2018-06-16 MED ORDER — RISPERIDONE 2 MG PO TABS
3.0000 mg | ORAL_TABLET | Freq: Two times a day (BID) | ORAL | Status: DC
  Administered 2018-06-16 – 2018-06-17 (×2): 3 mg via ORAL
  Filled 2018-06-16 (×2): qty 1

## 2018-06-16 MED ORDER — ASPIRIN EC 81 MG PO TBEC
81.0000 mg | DELAYED_RELEASE_TABLET | Freq: Every day | ORAL | Status: DC
  Administered 2018-06-16 – 2018-06-17 (×2): 81 mg via ORAL
  Filled 2018-06-16 (×2): qty 1

## 2018-06-16 MED ORDER — OXYBUTYNIN CHLORIDE ER 5 MG PO TB24
5.0000 mg | ORAL_TABLET | Freq: Every day | ORAL | Status: DC
  Administered 2018-06-17: 5 mg via ORAL
  Filled 2018-06-16: qty 1

## 2018-06-16 NOTE — BH Assessment (Signed)
Assessment Note  [name] is an 53 y.o. female, who presents voluntary and unaccompanied to [facility]. Per chart, pt has seventeen ED visits in the past six months. Pt reported, she went to visit her potential group home Friday (06/14/2018). Pt reported, the worker of the group home in [facility] told her she is "probably accepted" and not to worry about it. Pt reported, the worker told her she has to speak to the director of the group home, her care coordinator Larena Sox) then she will let her know the decision sometime this week. Pt reported, she is worried that she is not going to be accepted to the group and it made her suicidal. Pt reported, she has a plan of overdosing on her pills or stabbing herself. Pt reported, wanting to stab family members. Pt reported, she has had 7-8 suicide attempts, the most recent attempt was in 2018 when she overdosed on her medications. Pt reported, the following stressor: family members and not getting into the group home. Pt reported, "seeing things fly around me." Pt did not expound on the flying objects. Pt reported, last week she scratching her arm with a comb. During the assessment the pt continued asking,"are y'all gonna keep me," what time is it, oh yea y'all gonna keep me." Pt denies, current self-injurious behaviors.   Pt denies abuse and substance use. Pt is linked to  [facility] team for medication management and counseling. Pt reported, taking medications as prescribed. Pt reported, she called her ACT Team tonight to discuss her shortness of breath not her suicidal ideations. Pt reported, previous inpatient admissions.  Pt presents alert in scrubs with logical, coherent speech. Pt's eye contact was good. Pt's mood, are anxious, helpless. Pt's affect was flat. Pt's thought process was coherent, relevant. Pt's judgement was impaired. Pt was oriented x4. Pt's concentration was normal. Pt's insight and impulse control are poor. Pt  reported, she does not feel safe outside of [facility]. Pt reported, if inpatient treatment is recommended she would sign-in voluntarily.   Diagnosis: Major depressive disorder, recurrent, severe with psychotic features.                   Intellectual disability (intellectual developmental disorder), Mild (by history)  Past Medical History:  Past Medical History:  Diagnosis Date  . Anxiety   . Asthma   . Borderline personality disorder (HCC)   . Cerebral palsy (HCC)   . COPD (chronic obstructive pulmonary disease) (HCC)   . Depression   . GERD (gastroesophageal reflux disease)   . Mild cognitive impairment   . Schizoaffective disorder (HCC)   . Wound abscess     Past Surgical History:  Procedure Laterality Date  . CHOLECYSTECTOMY    . INCISION AND DRAINAGE ABSCESS Right 01/02/2015   Procedure: INCISION AND DRAINAGE ABSCESS;  Surgeon: Tiney Rouge III, MD;  Location: [facility] ORS;  Service: General;  Laterality: Right;  . TONSILLECTOMY      Family History:  Family History  Problem Relation Age of Onset  . Heart attack Father   . Diabetes Mother     Social History:  reports that she has quit smoking. She has never used smokeless tobacco. She reports that she does not drink alcohol or use drugs.  Additional Social History:  Alcohol / Drug Use Pain Medications: See MAR Prescriptions: See MAR Over the Counter: See MAR History of alcohol / drug use?: No history of alcohol / drug abuse  CIWA: CIWA-Ar BP: 104/68 Pulse  Rate: 72 COWS:    Allergies:  Allergies  Allergen Reactions  . Penicillins Nausea And Vomiting and Other (See Comments)    Has patient had a PCN reaction causing immediate rash, facial/tongue/throat swelling, SOB or lightheadedness with hypotension: No Has patient had a PCN reaction causing severe rash involving mucus membranes or skin necrosis: No Has patient had a PCN reaction that required hospitalization No Has patient had a PCN reaction occurring within the last  10 years: No If all of the above answers are "NO", then may proceed with Cephalosporin use      Home Medications: (Not in a hospital admission)   OB/GYN Status:  Patient's last menstrual period was 05/28/2018 (lmp unknown).  General Assessment Data Location of Assessment: [facility] TTS Assessment: In system Is this a Tele or Face-to-Face Assessment?: Face-to-Face Is this an Initial Assessment or a Re-assessment for this encounter?: Initial Assessment Patient Accompanied by:: N/A Language Other than English: No Living Arrangements: Other (Comment)(Family members. ) What gender do you identify as?: Female Marital status: Single Living Arrangements: Parent, Other relatives Can pt return to current living arrangement?: Yes Admission Status: Voluntary Is patient capable of signing voluntary admission?: Yes Referral Source: Self/Family/Friend Insurance type: Medicare.      Crisis Care Plan Living Arrangements: Parent, Other relatives Legal Guardian: Other:(Self.) Name of Psychiatrist: [facility]. Name of Therapist: [facility].  Education Status Is patient currently in school?: No Is the patient employed, unemployed or receiving disability?: Receiving disability income  Risk to self with the past 6 months Suicidal Ideation: Yes-Currently Present Has patient been a risk to self within the past 6 months prior to admission? : Yes Suicidal Intent: Yes-Currently Present Has patient had any suicidal intent within the past 6 months prior to admission? : Yes Is patient at risk for suicide?: Yes Suicidal Plan?: Yes-Currently Present Has patient had any suicidal plan within the past 6 months prior to admission? : Yes Specify Current Suicidal Plan: Pt take pills or stab herself with a knife.  Access to Means: Yes Specify Access to Suicidal Means: Access to pills and knives.  What has been your use of drugs/alcohol within the last 12 months?: Negative. Previous Attempts/Gestures:  Yes How many times?: (7-8) Other Self Harm Risks: NA Triggers for Past Attempts: Unknown Intentional Self Injurious Behavior: (Scratching self. ) Comment - Self Injurious Behavior: Pt reported, scratching self with a comb last week.  Family Suicide History: Unknown Recent stressful life event(s): Other (Comment)(with family members, not going to group home. ) Persecutory voices/beliefs?: No Depression: Yes Depression Symptoms: Feeling angry/irritable, Feeling worthless/self pity, Loss of interest in usual pleasures, Guilt, Fatigue, Isolating, Tearfulness, Insomnia, Despondent Substance abuse history and/or treatment for substance abuse?: No Suicide prevention information given to non-admitted patients: Not applicable  Risk to Others within the past 6 months Homicidal Ideation: Yes-Currently Present Does patient have any lifetime risk of violence toward others beyond the six months prior to admission? : No Thoughts of Harm to Others: Yes-Currently Present Comment - Thoughts of Harm to Others: Pt reported, wanting to stab family members.  Current Homicidal Intent: Yes-Currently Present Current Homicidal Plan: Yes-Currently Present Describe Current Homicidal Plan: To stab family members with a knife.  Access to Homicidal Means: Yes Describe Access to Homicidal Means: Pt reported, access to knives.  Identified Victim: Family members. History of harm to others?: No Assessment of Violence: None Noted Violent Behavior Description: NA Does patient have access to weapons?: Yes (Comment)(Knives.) Criminal Charges Pending?: No  Does patient have a court date: No Is patient on probation?: No  Psychosis Hallucinations: Visual Delusions: None noted  Mental Status Report Appearance/Hygiene: In scrubs Eye Contact: Good Motor Activity: Unremarkable Speech: Logical/coherent Level of Consciousness: Alert Mood: Helpless, Anxious Affect: Flat Anxiety Level: Severe Thought Processes: Coherent,  Relevant Judgement: Impaired Orientation: Person, Place, Time, Situation Obsessive Compulsive Thoughts/Behaviors: None  Cognitive Functioning Concentration: Normal Memory: Recent Intact Is patient IDD: Yes Level of Function: Mild.  Is IQ score available?: No Insight: Poor Impulse Control: Poor Appetite: Poor Have you had any weight changes? : No Change Sleep: Decreased Total Hours of Sleep: 4 Vegetative Symptoms: None  ADLScreening [facility] Assessment Services) Patient's cognitive ability adequate to safely complete daily activities?: Yes Patient able to express need for assistance with ADLs?: Yes Independently performs ADLs?: Yes (appropriate for developmental age)  Prior Inpatient Therapy Prior Inpatient Therapy: Yes Prior Therapy Dates: Per chart, "multiple."  Prior Therapy Facilty/Provider(s): Per chart, "multiple."  Reason for Treatment: Per chart, "IDD and Depression."   Prior Outpatient Therapy Prior Outpatient Therapy: Yes Prior Therapy Dates: Current. Prior Therapy Facilty/Provider(s): [facility].  Reason for Treatment: Medication management and counseling.  Does patient have an ACCT team?: Yes([facility]) Does patient have Intensive In-House Services?  : No Does patient have [facility] services? : No Does patient have P4CC services?: No  ADL Screening (condition at time of admission) Patient's cognitive ability adequate to safely complete daily activities?: Yes Is the patient deaf or have difficulty hearing?: No Does the patient have difficulty seeing, even when wearing glasses/contacts?: Yes(Pt wears glasses. ) Does the patient have difficulty concentrating, remembering, or making decisions?: Yes Patient able to express need for assistance with ADLs?: Yes Does the patient have difficulty dressing or bathing?: Yes Independently performs ADLs?: Yes (appropriate for developmental age) Does the patient have difficulty walking or climbing stairs?: No Weakness of Legs:  None Weakness of Arms/Hands: None  Home Assistive Devices/Equipment Home Assistive Devices/Equipment: Eyeglasses    Abuse/Neglect Assessment (Assessment to be complete while patient is alone) Abuse/Neglect Assessment Can Be Completed: Yes Physical Abuse: Denies(Pt denies. ) Verbal Abuse: Denies(Pt denies. ) Sexual Abuse: Denies(Pt denies. ) Exploitation of patient/patient's resources: Denies(Pt denies. ) Self-Neglect: Denies(Pt denies. )     Advance Directives (For Healthcare) Does Patient Have a Medical Advance Directive?: No Would patient like information on creating a medical advance directive?: No - Patient declined          Disposition: Nira Conn, FNP recommends overnight observation for safety, stabilization and re-evaluation. Disposition discussed with Greta Doom, PA and Terri, RN.    Disposition Initial Assessment Completed for this Encounter: Yes  On Site Evaluation by: Redmond Pulling, MS, [facility], [facility]. Reviewed with Physician: Greta Doom, PA and Nira Conn, FNP.   Redmond Pulling 06/16/2018 10:34 PM     Redmond Pulling, MS, [facility], [facility] Triage Specialist [phone]

## 2018-06-16 NOTE — ED Notes (Signed)
Bed: WA28 Expected date:  Expected time:  Means of arrival:  Comments: 

## 2018-06-16 NOTE — ED Triage Notes (Signed)
Per [facility] pt from home for SI and HI. Vitals 108/74, HR 66, 97% on ra, CBG 98

## 2018-06-16 NOTE — [facility] Counselor (Signed)
Pt reported, her family knows where she is. Pt declined to have clinician contact family to gather collateral information.     Redmond Pulling, MS, [facility], [facility] Triage Specialist [phone]

## 2018-06-16 NOTE — ED Notes (Signed)
EKG given to EDP,Pollina,MD., for review. 

## 2018-06-16 NOTE — ED Provider Notes (Addendum)
[facility]-EMERGENCY DEPT Provider Note   CSN: [id number] Arrival date & time: 06/16/18  1907    History   Chief Complaint Chief Complaint  Patient presents with  . Suicidal  . Homicidal    HPI [name] is a 53 y.o. female.     The history is provided by the patient and medical records. No language interpreter was used.     53 year old female with history of schizoaffective disorder, borderline personality, anxiety, asthma brought here via EMS from home for evaluation of homicidal and suicidal ideation.  Patient reports she has had increased suicidal thoughts as well as homicidal thoughts.  She mentioned she does not get along well with the family members at home and would like to go to a group home.  She is afraid that she may not get accepted to the group home therefore it has high in her suicidal ideation without any specific plan.  She does have homicidal ideation with thoughts of killing her sister with a knife.  She denies auditory or visual hallucination.  She denies any medication changes.  She endorsed decrease in appetite.  She would like to talk to someone.  Past Medical History:  Diagnosis Date  . Anxiety   . Asthma   . Borderline personality disorder (HCC)   . Cerebral palsy (HCC)   . COPD (chronic obstructive pulmonary disease) (HCC)   . Depression   . GERD (gastroesophageal reflux disease)   . Mild cognitive impairment   . Schizoaffective disorder (HCC)   . Wound abscess     Patient Active Problem List   Diagnosis Date Noted  . 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 Overdose by acetaminophen 08/08/2016  . Schizoaffective disorder, bipolar type (HCC)   . 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 GERD (gastroesophageal reflux disease) 09/24/2014  . COPD (chronic obstructive pulmonary disease) (HCC) 09/24/2014  . Borderline personality disorder (HCC) 09/24/2014  . Cerebral palsy (HCC)  08/31/2014    Past Surgical History:  Procedure Laterality Date  . CHOLECYSTECTOMY    . INCISION AND DRAINAGE ABSCESS Right 01/02/2015   Procedure: INCISION AND DRAINAGE ABSCESS;  Surgeon: Tiney Rouge III, MD;  Location: [facility] ORS;  Service: General;  Laterality: Right;  . TONSILLECTOMY       OB History    Gravida  0   Para  0   Term  0   Preterm  0   AB  0   Living  0     SAB  0   TAB  0   Ectopic  0   Multiple  0   Live Births               Home Medications    Prior to Admission medications   Medication Sig Start Date End Date Taking? Authorizing Provider  aspirin EC 81 MG tablet Take 1 tablet (81 mg total) by mouth daily. 12/14/17   Clapacs, Jackquline Denmark, MD  clonazePAM (KLONOPIN) 0.5 MG tablet Take 1 tablet (0.5 mg total) by mouth 2 (two) times daily. 12/14/17   Clapacs, Jackquline Denmark, MD  divalproex (DEPAKOTE) 500 MG DR tablet Take 1 tablet (500 mg total) by mouth 2 (two) times daily. 12/14/17   Clapacs, Jackquline Denmark, MD  escitalopram (LEXAPRO) 20 MG tablet Take 1 tablet (20 mg total) by mouth daily. 12/14/17   Clapacs, Jackquline Denmark, MD  famotidine (PEPCID) 20 MG tablet Take 1 tablet (20 mg total) by mouth 2 (two)  times daily. 06/11/18   Khatri, Hina, PA-C  fenofibrate 160 MG tablet Take 1 tablet (160 mg total) by mouth daily. 12/14/17   Clapacs, Jackquline Denmark, MD  hydrOXYzine (ATARAX/VISTARIL) 25 MG tablet Take 1 tablet (25 mg total) by mouth every 6 (six) hours as needed for anxiety. Patient taking differently: Take 25 mg by mouth 2 (two) times daily.  05/11/18   Fayrene Helper, PA-C  levothyroxine (SYNTHROID, LEVOTHROID) 50 MCG tablet Take 1 tablet (50 mcg total) by mouth daily before breakfast. 12/14/17   Clapacs, Jackquline Denmark, MD  oxybutynin (DITROPAN-XL) 5 MG 24 hr tablet Take 1 tablet (5 mg total) by mouth daily. 12/14/17   Clapacs, Jackquline Denmark, MD  pantoprazole (PROTONIX) 40 MG tablet Take 1 tablet (40 mg total) by mouth daily. 12/14/17   Clapacs, Jackquline Denmark, MD  risperiDONE (RISPERDAL) 3 MG tablet Take 1  tablet (3 mg total) by mouth 2 (two) times daily. Patient taking differently: Take 3 mg by mouth daily.  12/14/17   Clapacs, Jackquline Denmark, MD  sucralfate (CARAFATE) 1 g tablet Take 1 tablet (1 g total) by mouth 4 (four) times daily -  with meals and at bedtime. Patient taking differently: Take 1 g by mouth daily.  12/14/17   Clapacs, Jackquline Denmark, MD    Family History Family History  Problem Relation Age of Onset  . Heart attack Father   . Diabetes Mother     Social History Social History   Tobacco Use  . Smoking status: Former Games developer  . Smokeless tobacco: Never Used  Substance Use Topics  . Alcohol use: No  . Drug use: No     Allergies   Penicillins   Review of Systems Review of Systems  All other systems reviewed and are negative.    Physical Exam Updated Vital Signs BP 104/68 (BP Location: Left Arm)   Pulse 72   Temp 97.8 F (36.6 C) (Oral)   Resp 18   Ht 5\' 3"  (1.6 m)   Wt 97.5 kg   LMP 05/28/2018 (LMP Unknown)   SpO2 95%   BMI 38.09 kg/m   Physical Exam Vitals signs and nursing note reviewed.  Constitutional:      General: She is not in acute distress.    Appearance: She is well-developed.  HENT:     Head: Atraumatic.  Eyes:     Conjunctiva/sclera: Conjunctivae normal.  Neck:     Musculoskeletal: Neck supple.  Cardiovascular:     Rate and Rhythm: Normal rate and regular rhythm.  Pulmonary:     Effort: Pulmonary effort is normal.     Breath sounds: Normal breath sounds.  Abdominal:     Palpations: Abdomen is soft.  Skin:    Findings: No rash.  Neurological:     Mental Status: She is alert. Mental status is at baseline.     GCS: GCS eye subscore is 4. GCS verbal subscore is 5. GCS motor subscore is 6.  Psychiatric:        Mood and Affect: Affect is blunt.        Speech: Speech is delayed.        Behavior: Behavior is slowed.        Thought Content: Thought content includes homicidal and suicidal ideation.      ED Treatments / Results  Labs (all  labs ordered are listed, but only abnormal results are displayed) Labs Reviewed  COMPREHENSIVE METABOLIC PANEL - Abnormal; Notable for the following components:  Result Value   BUN 26 (*)    Total Bilirubin 0.2 (*)    All other components within normal limits  ACETAMINOPHEN LEVEL - Abnormal; Notable for the following components:   Acetaminophen (Tylenol), Serum <10 (*)    All other components within normal limits  CBC - Abnormal; Notable for the following components:   RBC 3.75 (*)    MCV 102.7 (*)    All other components within normal limits  ETHANOL  SALICYLATE LEVEL  RAPID URINE DRUG SCREEN, HOSP PERFORMED    EKG EKG Interpretation  Date/Time:  Sunday June 16 2018 23:37:52 EST Ventricular Rate:  70 PR Interval:  222 QRS Duration: 80 QT Interval:  400 QTC Calculation: 432 R Axis:   50 Text Interpretation:  Sinus rhythm with 1st degree A-V block with Fusion complexes Low voltage QRS Borderline ECG Confirmed by Gilda Crease [phone]) on 06/16/2018 11:50:09 PM   Radiology No results found.  Procedures Procedures (including critical care time)  Medications Ordered in ED Medications  acetaminophen (TYLENOL) tablet 650 mg (has no administration in time range)  zolpidem (AMBIEN) tablet 5 mg (has no administration in time range)  ondansetron (ZOFRAN) tablet 4 mg (has no administration in time range)  alum & mag hydroxide-simeth (MAALOX/MYLANTA) 200-200-20 MG/5ML suspension 30 mL (has no administration in time range)  divalproex (DEPAKOTE) DR tablet 500 mg (has no administration in time range)  escitalopram (LEXAPRO) tablet 20 mg (has no administration in time range)  levothyroxine (SYNTHROID, LEVOTHROID) tablet 50 mcg (has no administration in time range)  oxybutynin (DITROPAN-XL) 24 hr tablet 5 mg (has no administration in time range)  risperiDONE (RISPERDAL) tablet 3 mg (has no administration in time range)  sucralfate (CARAFATE) tablet 1 g (has no  administration in time range)  clonazePAM (KLONOPIN) tablet 0.5 mg (has no administration in time range)  aspirin EC tablet 81 mg (has no administration in time range)  pantoprazole (PROTONIX) EC tablet 40 mg (has no administration in time range)     Initial Impression / Assessment and Plan / ED Course  I have reviewed the triage vital signs and the nursing notes.  Pertinent labs & imaging results that were available during my care of the patient were reviewed by me and considered in my medical decision making (see chart for details).        BP 104/68 (BP Location: Left Arm)   Pulse 72   Temp 97.8 F (36.6 C) (Oral)   Resp 18   Ht  (1.6 m)   Wt 97.5 kg   LMP 05/28/2018 (LMP Unknown)   SpO2 95%   BMI 38.09 kg/m    Final Clinical Impressions(s) / ED Diagnoses   Final diagnoses:  Suicidal ideations  Homicidal ideation    ED Discharge Orders    None     7:56 PM Patient here with suicidal and homicidal ideation.  She is resting comfortably no acute discomfort.  Work-up initiated.  Medical screen.  9:20 PM Pt is medically cleared and can be evaluate further by TTS for psychiatric management.    Fayrene Helper, PA-C 06/16/18 2121  12:07 AM Pt report having chest pain.  Pain atypical of ACS.  EKG unremarkable. Doubt MI.   Fayrene Helper, PA-C 06/17/18 0007    Loren Racer, MD 06/17/18 [phone]

## 2018-06-17 ENCOUNTER — Other Ambulatory Visit: Payer: Self-pay

## 2018-06-17 ENCOUNTER — Emergency Department (HOSPITAL_COMMUNITY): Payer: Medicare Other

## 2018-06-17 ENCOUNTER — Emergency Department (HOSPITAL_COMMUNITY)
Admission: EM | Admit: 2018-06-17 | Discharge: 2018-06-18 | Disposition: A | Discharge status: Home / Self Care | Payer: Medicare Other | Source: Home / Self Care | Attending: Emergency Medicine | Admitting: Emergency Medicine

## 2018-06-17 ENCOUNTER — Encounter (HOSPITAL_COMMUNITY): Payer: Self-pay

## 2018-06-17 DIAGNOSIS — R0789 Other chest pain: Secondary | ICD-10-CM

## 2018-06-17 DIAGNOSIS — Z87891 Personal history of nicotine dependence: Secondary | ICD-10-CM

## 2018-06-17 DIAGNOSIS — Z79899 Other long term (current) drug therapy: Secondary | ICD-10-CM

## 2018-06-17 DIAGNOSIS — M791 Myalgia, unspecified site: Secondary | ICD-10-CM | POA: Insufficient documentation

## 2018-06-17 DIAGNOSIS — F250 Schizoaffective disorder, bipolar type: Secondary | ICD-10-CM

## 2018-06-17 DIAGNOSIS — F4325 Adjustment disorder with mixed disturbance of emotions and conduct: Secondary | ICD-10-CM | POA: Diagnosis not present

## 2018-06-17 DIAGNOSIS — R45851 Suicidal ideations: Secondary | ICD-10-CM | POA: Insufficient documentation

## 2018-06-17 DIAGNOSIS — J449 Chronic obstructive pulmonary disease, unspecified: Secondary | ICD-10-CM | POA: Insufficient documentation

## 2018-06-17 DIAGNOSIS — R45850 Homicidal ideations: Secondary | ICD-10-CM

## 2018-06-17 DIAGNOSIS — Z7982 Long term (current) use of aspirin: Secondary | ICD-10-CM | POA: Insufficient documentation

## 2018-06-17 DIAGNOSIS — R0602 Shortness of breath: Secondary | ICD-10-CM | POA: Insufficient documentation

## 2018-06-17 DIAGNOSIS — F333 Major depressive disorder, recurrent, severe with psychotic symptoms: Secondary | ICD-10-CM | POA: Diagnosis not present

## 2018-06-17 LAB — RAPID URINE DRUG SCREEN, HOSP PERFORMED
Amphetamines: NOT DETECTED
Barbiturates: NOT DETECTED
Benzodiazepines: NOT DETECTED
Cocaine: NOT DETECTED
OPIATES: NOT DETECTED
Tetrahydrocannabinol: NOT DETECTED

## 2018-06-17 LAB — COMPREHENSIVE METABOLIC PANEL
ALT: 18 U/L (ref 0–44)
AST: 23 U/L (ref 15–41)
Albumin: 3.3 g/dL — ABNORMAL LOW (ref 3.5–5.0)
Alkaline Phosphatase: 36 U/L — ABNORMAL LOW (ref 38–126)
Anion gap: 8 (ref 5–15)
BUN: 22 mg/dL — ABNORMAL HIGH (ref 6–20)
CO2: 26 mmol/L (ref 22–32)
Calcium: 8.8 mg/dL — ABNORMAL LOW (ref 8.9–10.3)
Chloride: 106 mmol/L (ref 98–111)
Creatinine, Ser: 0.75 mg/dL (ref 0.44–1.00)
GFR calc Af Amer: >60 mL/min (ref >60)
GFR calc non Af Amer: >60 mL/min (ref >60)
Glucose, Bld: 97 mg/dL (ref 70–99)
Potassium: 3.8 mmol/L (ref 3.5–5.1)
Sodium: 140 mmol/L (ref 135–145)
Total Bilirubin: 0.3 mg/dL (ref 0.3–1.2)
Total Protein: 6.2 g/dL — ABNORMAL LOW (ref 6.5–8.1)

## 2018-06-17 LAB — CBC
HCT: 36 % (ref 36.0–46.0)
Hemoglobin: 11.8 g/dL — ABNORMAL LOW (ref 12.0–15.0)
MCH: 32.7 pg (ref 26.0–34.0)
MCHC: 32.8 g/dL (ref 30.0–36.0)
MCV: 99.7 fL (ref 80.0–100.0)
Platelets: 265 10*3/uL (ref 150–400)
RBC: 3.61 MIL/uL — ABNORMAL LOW (ref 3.87–5.11)
RDW: 13.2 % (ref 11.5–15.5)
WBC: 7.7 10*3/uL (ref 4.0–10.5)
nRBC: 0 % (ref 0.0–0.2)

## 2018-06-17 LAB — I-STAT TROPONIN, ED: Troponin i, poc: 0 ng/mL (ref 0.00–0.08)

## 2018-06-17 LAB — I-STAT BETA HCG BLOOD, ED (MC, WL, AP ONLY): I-stat hCG, quantitative: <5 m[IU]/mL (ref <5)

## 2018-06-17 LAB — ACETAMINOPHEN LEVEL: Acetaminophen (Tylenol), Serum: <10 ug/mL — ABNORMAL LOW (ref 10–30)

## 2018-06-17 LAB — SALICYLATE LEVEL: Salicylate Lvl: <7 mg/dL (ref 2.8–30.0)

## 2018-06-17 LAB — ETHANOL: Alcohol, Ethyl (B): <10 mg/dL (ref <10)

## 2018-06-17 MED ORDER — ALBUTEROL SULFATE HFA 108 (90 BASE) MCG/ACT IN AERS: 2.0000 | INHALATION_SPRAY | Freq: Four times a day (QID) | RESPIRATORY_TRACT | Status: DC

## 2018-06-17 MED ORDER — SODIUM CHLORIDE 0.9% FLUSH: 3.0000 mL | Freq: Once | INTRAVENOUS | Status: DC

## 2018-06-17 MED ORDER — ALBUTEROL SULFATE HFA 108 (90 BASE) MCG/ACT IN AERS: 2.0000 | INHALATION_SPRAY | RESPIRATORY_TRACT | 0 refills | Status: DC | PRN

## 2018-06-17 NOTE — ED Notes (Addendum)
Pt came to security desk stating she's feeling SI/HI and she's scared and need to be seen. RN contacted Sort RN, RN was informed to have Pt come to [employer] zone

## 2018-06-17 NOTE — BH Assessment (Signed)
[facility] Assessment Progress Note  Per [name], DO, this pt does not require psychiatric hospitalization at this time.  Pt is to be discharged from [facility] with recommendation to continue treatment with the [facility] Community Support Team.  This has been included in pt's discharge instructions.  At 10:15 this Clinical research associate called the [employer] and spoke to [employer].  She reports that she will present at [facility] to pick pt up within the next two hours.  She also agrees to call me to arrange any change in timing.  Pt's nurse, Addison Naegeli, has been notified.  Doylene Canning, MA Triage Specialist [phone]

## 2018-06-17 NOTE — Discharge Instructions (Signed)
For your behavioral health needs, you are advised to continue treatment with the [facility] Community Support Team: ° °     Psychotherapeutic Services Community Support Team °     [facility], [address] °     [address] °     [place], [place]  [address] °     [phone] °     Crisis number: [phone] °

## 2018-06-17 NOTE — ED Triage Notes (Signed)
Pt here by EMS for shortness of breath and generalized pain all over.  Pt states pain and shortness of breath has been there for 2 weeks. A&Ox4

## 2018-06-17 NOTE — Consult Note (Addendum)
[facility] Psych ED Discharge  06/17/2018 10:14 AM Emagene Rossing  MRN:  [id number] Principal Problem: Adjustment disorder with mixed disturbance of emotions and conduct Discharge Diagnoses: Principal Problem:   Adjustment disorder with mixed disturbance of emotions and conduct Active Problems:   Borderline personality disorder (HCC)   Schizoaffective disorder, bipolar type (HCC)  Subjective: Patient got upset about the possibility of not getting group home placement and became suicidal.  She is well known to this ED and providers for similar presentations, multiple in the past few weeks, when she gets upset.  Caveat:  Cognitive impairment with low low threshold for frustration.  On assessment, she denies current suicidal ideations but having some shortness of breath, history of asthma, albuterol inhaler ordered.  No hallucinations or homicidal ideations or substance abuse.  Her suicidal ideations are initiated with stress and reduced with social support.  She does have a care coordinator and ACT team who were notified so they could come get her and continue her care.  Total Time spent with patient: 45 minutes  Past Psychiatric History: schizoaffective disorder  Past Medical History:  Past Medical History:  Diagnosis Date  . Anxiety   . Asthma   . Borderline personality disorder (HCC)   . Cerebral palsy (HCC)   . COPD (chronic obstructive pulmonary disease) (HCC)   . Depression   . GERD (gastroesophageal reflux disease)   . Mild cognitive impairment   . Schizoaffective disorder (HCC)   . Wound abscess     Past Surgical History:  Procedure Laterality Date  . CHOLECYSTECTOMY    . INCISION AND DRAINAGE ABSCESS Right 01/02/2015   Procedure: INCISION AND DRAINAGE ABSCESS;  Surgeon: Tiney Rouge III, MD;  Location: [facility] ORS;  Service: General;  Laterality: Right;  . TONSILLECTOMY     Family History:  Family History  Problem Relation Age of Onset  . Heart attack Father   . Diabetes Mother     Family Psychiatric  History: none Social History:  Social History   Substance and Sexual Activity  Alcohol Use No     Social History   Substance and Sexual Activity  Drug Use No    Social History   Socioeconomic History  . Marital status: Single    Spouse name: Not on file  . Number of children: Not on file  . Years of education: Not on file  . Highest education level: Not on file  Occupational History  . Not on file  Social Needs  . Financial resource strain: Not on file  . Food insecurity:    Worry: Not on file    Inability: Not on file  . Transportation needs:    Medical: Not on file    Non-medical: Not on file  Tobacco Use  . Smoking status: Former Games developer  . Smokeless tobacco: Never Used  Substance and Sexual Activity  . Alcohol use: No  . Drug use: No  . Sexual activity: Not Currently  Lifestyle  . Physical activity:    Days per week: Not on file    Minutes per session: Not on file  . Stress: Not on file  Relationships  . Social connections:    Talks on phone: Not on file    Gets together: Not on file    Attends religious service: Not on file    Active member of club or organization: Not on file    Attends meetings of clubs or organizations: Not on file    Relationship status: Not on file  Other Topics Concern  . Not on file  Social History Narrative  . Not on file    Has this patient used any form of tobacco in the last 30 days? (Cigarettes, Smokeless Tobacco, Cigars, and/or Pipes) NA  Current Medications: Current Facility-Administered Medications  Medication Dose Route Frequency Provider Last Rate Last Dose  . acetaminophen (TYLENOL) tablet 650 mg  650 mg Oral Q4H PRN Fayrene Helper, PA-C      . albuterol (PROVENTIL HFA;VENTOLIN HFA) 108 (90 Base) MCG/ACT inhaler 2 puff  2 puff Inhalation Q6H Charm Rings, NP      . alum & mag hydroxide-simeth (MAALOX/MYLANTA) 200-200-20 MG/5ML suspension 30 mL  30 mL Oral Q6H PRN Fayrene Helper, PA-C      .  aspirin EC tablet 81 mg  81 mg Oral Daily Fayrene Helper, PA-C   81 mg at 06/17/18 0943  . clonazePAM (KLONOPIN) tablet 0.5 mg  0.5 mg Oral BID Fayrene Helper, PA-C   0.5 mg at 06/17/18 1610  . divalproex (DEPAKOTE) DR tablet 500 mg  500 mg Oral BID Fayrene Helper, PA-C   500 mg at 06/17/18 9604  . escitalopram (LEXAPRO) tablet 20 mg  20 mg Oral Daily Fayrene Helper, PA-C   20 mg at 06/17/18 5409  . levothyroxine (SYNTHROID, LEVOTHROID) tablet 50 mcg  50 mcg Oral Q0600 Fayrene Helper, PA-C   50 mcg at 06/17/18 8119  . ondansetron (ZOFRAN) tablet 4 mg  4 mg Oral Q8H PRN Fayrene Helper, PA-C      . oxybutynin (DITROPAN-XL) 24 hr tablet 5 mg  5 mg Oral Daily Fayrene Helper, PA-C   5 mg at 06/17/18 0940  . pantoprazole (PROTONIX) EC tablet 40 mg  40 mg Oral Daily Fayrene Helper, PA-C   40 mg at 06/17/18 1478  . risperiDONE (RISPERDAL) tablet 3 mg  3 mg Oral BID Fayrene Helper, PA-C   3 mg at 06/17/18 0941  . sucralfate (CARAFATE) tablet 1 g  1 g Oral TID WC & HS Fayrene Helper, PA-C   1 g at 06/17/18 0900  . zolpidem (AMBIEN) tablet 5 mg  5 mg Oral QHS PRN Fayrene Helper, PA-C   5 mg at 06/16/18 2242   Current Outpatient Medications  Medication Sig Dispense Refill  . aspirin EC 81 MG tablet Take 1 tablet (81 mg total) by mouth daily. 30 tablet 1  . clonazePAM (KLONOPIN) 0.5 MG tablet Take 1 tablet (0.5 mg total) by mouth 2 (two) times daily. 60 tablet 1  . divalproex (DEPAKOTE) 500 MG DR tablet Take 1 tablet (500 mg total) by mouth 2 (two) times daily. 60 tablet 1  . escitalopram (LEXAPRO) 20 MG tablet Take 1 tablet (20 mg total) by mouth daily. 30 tablet 1  . famotidine (PEPCID) 20 MG tablet Take 1 tablet (20 mg total) by mouth 2 (two) times daily. 30 tablet 0  . fenofibrate 160 MG tablet Take 1 tablet (160 mg total) by mouth daily. 30 tablet 1  . hydrOXYzine (ATARAX/VISTARIL) 25 MG tablet Take 1 tablet (25 mg total) by mouth every 6 (six) hours as needed for anxiety. (Patient taking differently: Take 25 mg by mouth 2 (two) times  daily. ) 12 tablet 0  . levothyroxine (SYNTHROID, LEVOTHROID) 50 MCG tablet Take 1 tablet (50 mcg total) by mouth daily before breakfast. 30 tablet 1  . oxybutynin (DITROPAN-XL) 5 MG 24 hr tablet Take 1 tablet (5 mg total) by mouth daily. 30 tablet 1  . pantoprazole (PROTONIX) 40 MG tablet  Take 1 tablet (40 mg total) by mouth daily. 30 tablet 1  . risperiDONE (RISPERDAL) 3 MG tablet Take 1 tablet (3 mg total) by mouth 2 (two) times daily. (Patient taking differently: Take 3 mg by mouth daily. ) 60 tablet 1  . sucralfate (CARAFATE) 1 g tablet Take 1 tablet (1 g total) by mouth 4 (four) times daily -  with meals and at bedtime. (Patient taking differently: Take 1 g by mouth daily. ) 120 tablet 1   PTA Medications: (Not in a hospital admission)   Musculoskeletal: Strength & Muscle Tone: within normal limits Gait & Station: normal Patient leans: N/A  Psychiatric Specialty Exam: Physical Exam  Nursing note and vitals reviewed. Constitutional: She is oriented to person, place, and time. She appears well-developed and well-nourished.  HENT:  Head: Normocephalic.  Neck: Normal range of motion.  Respiratory: Effort normal.  Musculoskeletal: Normal range of motion.  Neurological: She is alert and oriented to person, place, and time.  Psychiatric: Her speech is normal and behavior is normal. Judgment and thought content normal. Her mood appears anxious. Her affect is blunt. Cognition and memory are impaired.    Review of Systems  Respiratory: Positive for shortness of breath.   Psychiatric/Behavioral: The patient is nervous/anxious.   All other systems reviewed and are negative.   Blood pressure 104/65, pulse 60, temperature 98.2 F (36.8 C), temperature source Oral, resp. rate 18, height  (1.6 m), weight 97.5 kg, last menstrual period 05/28/2018, SpO2 93 %.Body mass index is 38.09 kg/m.  General Appearance: Casual  Eye Contact:  Good  Speech:  Normal Rate  Volume:  Normal  Mood:   Anxious  Affect:  Blunt  Thought Process:  Coherent and Descriptions of Associations: Intact  Orientation:  Full (Time, Place, and Person)  Thought Content:  WDL and Logical  Suicidal Thoughts:  No  Homicidal Thoughts:  No  Memory:  Immediate;   Good Recent;   Good Remote;   Good  Judgement:  Poor  Insight:  Fair  Psychomotor Activity:  Normal  Concentration:  Concentration: Good and Attention Span: Good  Recall:  [employer] of Knowledge:  Fair  Language:  Good  Akathisia:  No  Handed:  Right  AIMS (if indicated):   N/A  Assets:  Housing Leisure Time Physical Health Resilience Social Support  ADL's:  Intact  Cognition:  WNL  Sleep:   N/A     Demographic Factors:  Caucasian  Loss Factors: NA  Historical Factors: Impulsivity  Risk Reduction Factors:   Sense of responsibility to family, Living with another person, especially a relative, Positive social support and Positive therapeutic relationship  Continued Clinical Symptoms:  Anxiety, mild  Cognitive Features That Contribute To Risk:  None    Suicide Risk:  Minimal: No identifiable suicidal ideation.  Patients presenting with no risk factors but with morbid ruminations; may be classified as minimal risk based on the severity of the depressive symptoms   Plan Of Care/Follow-up recommendations:  Adjustment disorder with mixed disturbance of emotions and conduct: -Restarted Klonopin 0.5 mg BID -Restarted Vistaril 25 mg every 8 hours PRN anxiety  Schizoaffective disorder, depressive disorder: -Restarted Lexapro 20 mg daily -Restarted Depakote 500 mg BID -Continued Risperdal 3 mg BID -Contacted her ACT team and care coordinator for update and coordination of care  Asthma: -Started albuterol 2 puffs every hours Activity:  as tolerated Diet:  heart healthy diet  Disposition: discharge to her ACT team  Nanine Means, NP 06/17/2018,  10:14 AM   Patient seen face-to-face for psychiatric evaluation, chart  reviewed and case discussed with the physician extender and developed treatment plan. Reviewed the information documented and agree with the treatment plan.  [name], DO 06/17/18 4:17 PM

## 2018-06-17 NOTE — ED Notes (Signed)
This RN informed that patient began to feel suicidal.  Pt brought back to room and changed into purple scrubs and SI orders placed.

## 2018-06-18 ENCOUNTER — Emergency Department (HOSPITAL_COMMUNITY)
Admission: EM | Admit: 2018-06-18 | Discharge: 2018-06-19 | Disposition: A | Discharge status: Home / Self Care | Payer: Medicare Other | Attending: Emergency Medicine | Admitting: Emergency Medicine

## 2018-06-18 ENCOUNTER — Other Ambulatory Visit: Payer: Self-pay

## 2018-06-18 ENCOUNTER — Encounter (HOSPITAL_COMMUNITY): Payer: Self-pay

## 2018-06-18 DIAGNOSIS — N390 Urinary tract infection, site not specified: Secondary | ICD-10-CM | POA: Diagnosis not present

## 2018-06-18 DIAGNOSIS — R45851 Suicidal ideations: Secondary | ICD-10-CM | POA: Insufficient documentation

## 2018-06-18 DIAGNOSIS — F333 Major depressive disorder, recurrent, severe with psychotic symptoms: Secondary | ICD-10-CM | POA: Diagnosis not present

## 2018-06-18 DIAGNOSIS — Z7982 Long term (current) use of aspirin: Secondary | ICD-10-CM | POA: Insufficient documentation

## 2018-06-18 DIAGNOSIS — T426X1A Poisoning by other antiepileptic and sedative-hypnotic drugs, accidental (unintentional), initial encounter: Secondary | ICD-10-CM | POA: Diagnosis not present

## 2018-06-18 DIAGNOSIS — R1084 Generalized abdominal pain: Secondary | ICD-10-CM | POA: Diagnosis present

## 2018-06-18 DIAGNOSIS — R079 Chest pain, unspecified: Secondary | ICD-10-CM | POA: Diagnosis not present

## 2018-06-18 DIAGNOSIS — Z79899 Other long term (current) drug therapy: Secondary | ICD-10-CM | POA: Diagnosis not present

## 2018-06-18 DIAGNOSIS — F4325 Adjustment disorder with mixed disturbance of emotions and conduct: Secondary | ICD-10-CM | POA: Diagnosis not present

## 2018-06-18 DIAGNOSIS — R110 Nausea: Secondary | ICD-10-CM | POA: Diagnosis not present

## 2018-06-18 DIAGNOSIS — F258 Other schizoaffective disorders: Secondary | ICD-10-CM | POA: Diagnosis not present

## 2018-06-18 DIAGNOSIS — G8929 Other chronic pain: Secondary | ICD-10-CM | POA: Insufficient documentation

## 2018-06-18 DIAGNOSIS — R1011 Right upper quadrant pain: Secondary | ICD-10-CM | POA: Diagnosis not present

## 2018-06-18 DIAGNOSIS — G809 Cerebral palsy, unspecified: Secondary | ICD-10-CM | POA: Insufficient documentation

## 2018-06-18 DIAGNOSIS — Z87891 Personal history of nicotine dependence: Secondary | ICD-10-CM | POA: Diagnosis not present

## 2018-06-18 DIAGNOSIS — R8281 Pyuria: Secondary | ICD-10-CM | POA: Diagnosis not present

## 2018-06-18 DIAGNOSIS — R197 Diarrhea, unspecified: Secondary | ICD-10-CM | POA: Insufficient documentation

## 2018-06-18 DIAGNOSIS — J449 Chronic obstructive pulmonary disease, unspecified: Secondary | ICD-10-CM | POA: Insufficient documentation

## 2018-06-18 LAB — I-STAT TROPONIN, ED: Troponin i, poc: 0.01 ng/mL (ref 0.00–0.08)

## 2018-06-18 LAB — VALPROIC ACID LEVEL: Valproic Acid Lvl: 61 ug/mL (ref 50.0–100.0)

## 2018-06-18 NOTE — ED Triage Notes (Signed)
Pt BIB EMS from home. Pt reports abdominal pain with nausea and diarrhea x4-5 days. Pt reports pain when urinating and incontinence but reports being treated for UTI with antibiotics.   117/51 HR 67 RR 16 SpO2 100% CBG 109 Temp 97.3  22G RH NS  Zofran 4 mg

## 2018-06-18 NOTE — ED Notes (Signed)
Pt continuously requesting cab voucher.

## 2018-06-18 NOTE — ED Notes (Signed)
Placed pt on purewick. Will collect urine sample when pt voids.

## 2018-06-18 NOTE — ED Notes (Signed)
Bed: WA01 Expected date:  Expected time:  Means of arrival:  Comments: EMS 

## 2018-06-18 NOTE — Discharge Instructions (Addendum)
I encourage you to follow-up with your act team to help with your chronic suicidal thoughts and thoughts of wanting to harm your sister-in-law.

## 2018-06-18 NOTE — ED Notes (Signed)
Pt is not endorsing SI, But endorses HI towards her sister-in-law

## 2018-06-18 NOTE — ED Provider Notes (Signed)
[facility]-EMERGENCY DEPT Provider Note   CSN: [id number] Arrival date & time: 06/18/18  2004    History   Chief Complaint Chief Complaint  Patient presents with  . Abdominal Pain    HPI [name] is a 53 y.o. female.     53 y/o female with hx of schizoaffective disorder, borderline personality, anxiety, asthma presents to the emergency department for multiple complaints.  On my presentation to the exam room, patient states that she is suicidal and homicidal.  States that her sister-in-law, with whom she lives, made her mad today and she wants to kill her.  On chart review, she has history of ED presentation for similar complaints.  She was evaluated by psychiatry on 06/17/2018 for this.  Also presented back to the emergency department today for these complaints.  Patient tacks on nondescript chest and abdominal discomfort with diarrhea x4 to 5 days.  This was noted at her prior ED evaluation as well.  Reports that she is currently on antibiotics for treatment of a urinary tract infection.  Note from prior ED provider states that patient does not own or have access to any weapons and "would not be able to harm her sister-in-law".  The history is provided by the patient. No language interpreter was used.  Abdominal Pain    Past Medical History:  Diagnosis Date  . Anxiety   . Asthma   . Borderline personality disorder (HCC)   . Cerebral palsy (HCC)   . COPD (chronic obstructive pulmonary disease) (HCC)   . Depression   . GERD (gastroesophageal reflux disease)   . Mild cognitive impairment   . Schizoaffective disorder (HCC)   . Wound abscess     Patient Active Problem List   Diagnosis Date Noted  . 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 Overdose by acetaminophen 08/08/2016  . Schizoaffective disorder, bipolar type (HCC)   . 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 GERD (gastroesophageal reflux disease)  09/24/2014  . COPD (chronic obstructive pulmonary disease) (HCC) 09/24/2014  . Borderline personality disorder (HCC) 09/24/2014  . Cerebral palsy (HCC) 08/31/2014    Past Surgical History:  Procedure Laterality Date  . CHOLECYSTECTOMY    . INCISION AND DRAINAGE ABSCESS Right 01/02/2015   Procedure: INCISION AND DRAINAGE ABSCESS;  Surgeon: Tiney Rouge III, MD;  Location: [facility] ORS;  Service: General;  Laterality: Right;  . TONSILLECTOMY       OB History    Gravida  0   Para  0   Term  0   Preterm  0   AB  0   Living  0     SAB  0   TAB  0   Ectopic  0   Multiple  0   Live Births               Home Medications    Prior to Admission medications   Medication Sig Start Date End Date Taking? Authorizing Provider  albuterol (PROVENTIL HFA;VENTOLIN HFA) 108 (90 Base) MCG/ACT inhaler Inhale 2 puffs into the lungs every 4 (four) hours as needed for wheezing or shortness of breath. 06/17/18  Yes Charm Rings, NP  aspirin EC 81 MG tablet Take 1 tablet (81 mg total) by mouth daily. 12/14/17  Yes Clapacs, Jackquline Denmark, MD  clonazePAM (KLONOPIN) 0.5 MG tablet Take 1 tablet (0.5 mg total) by mouth 2 (two) times daily. 12/14/17  Yes Clapacs, Jackquline Denmark, MD  divalproex (DEPAKOTE) 500  MG DR tablet Take 1 tablet (500 mg total) by mouth 2 (two) times daily. 12/14/17  Yes Clapacs, Jackquline DenmarkJohn T, MD  escitalopram (LEXAPRO) 20 MG tablet Take 1 tablet (20 mg total) by mouth daily. 12/14/17  Yes Clapacs, Jackquline DenmarkJohn T, MD  famotidine (PEPCID) 20 MG tablet Take 1 tablet (20 mg total) by mouth 2 (two) times daily. 06/11/18  Yes Khatri, Hina, PA-C  fenofibrate 160 MG tablet Take 1 tablet (160 mg total) by mouth daily. 12/14/17  Yes Clapacs, Jackquline DenmarkJohn T, MD  hydrOXYzine (ATARAX/VISTARIL) 25 MG tablet Take 1 tablet (25 mg total) by mouth every 6 (six) hours as needed for anxiety. Patient taking differently: Take 25 mg by mouth 2 (two) times daily.  05/11/18  Yes Fayrene Helperran, [place], PA-C  levothyroxine (SYNTHROID, LEVOTHROID) 50 MCG  tablet Take 1 tablet (50 mcg total) by mouth daily before breakfast. 12/14/17  Yes Clapacs, Jackquline DenmarkJohn T, MD  oxybutynin (DITROPAN-XL) 5 MG 24 hr tablet Take 1 tablet (5 mg total) by mouth daily. 12/14/17  Yes Clapacs, Jackquline DenmarkJohn T, MD  pantoprazole (PROTONIX) 40 MG tablet Take 1 tablet (40 mg total) by mouth daily. 12/14/17  Yes Clapacs, Jackquline DenmarkJohn T, MD  risperiDONE (RISPERDAL) 3 MG tablet Take 1 tablet (3 mg total) by mouth 2 (two) times daily. Patient taking differently: Take 3 mg by mouth daily.  12/14/17  Yes Clapacs, Jackquline DenmarkJohn T, MD  sucralfate (CARAFATE) 1 g tablet Take 1 tablet (1 g total) by mouth 4 (four) times daily -  with meals and at bedtime. Patient taking differently: Take 1 g by mouth daily.  12/14/17  Yes Clapacs, Jackquline DenmarkJohn T, MD    Family History Family History  Problem Relation Age of Onset  . Heart attack Father   . Diabetes Mother     Social History Social History   Tobacco Use  . Smoking status: Former Games developermoker  . Smokeless tobacco: Never Used  Substance Use Topics  . Alcohol use: No  . Drug use: No     Allergies   Penicillins   Review of Systems Review of Systems  Gastrointestinal: Positive for abdominal pain.  Ten systems reviewed and are negative for acute change, except as noted in the HPI.     Physical Exam Updated Vital Signs BP 107/66   Pulse 69   Temp 97.7 F (36.5 C) (Oral)   Resp 16   Ht 5\' 3"  (1.6 m)   Wt 97.5 kg   LMP 05/28/2018 (LMP Unknown)   SpO2 92%   BMI 38.08 kg/m   Physical Exam Vitals signs and nursing note reviewed.  Constitutional:      General: She is not in acute distress.    Appearance: She is well-developed. She is not diaphoretic.     Comments: Obese female, nontoxic appearing  HENT:     Head: Normocephalic and atraumatic.  Eyes:     General: No scleral icterus.    Conjunctiva/sclera: Conjunctivae normal.  Neck:     Musculoskeletal: Normal range of motion.  Cardiovascular:     Rate and Rhythm: Normal rate and regular rhythm.      Pulses: Normal pulses.  Pulmonary:     Effort: Pulmonary effort is normal. No respiratory distress.     Comments: Respirations even and unlabored Abdominal:     Comments: Soft, obese, nondistended. No peritoneal signs.  Musculoskeletal: Normal range of motion.  Skin:    General: Skin is warm and dry.     Coloration: Skin is not pale.  Findings: No erythema or rash.  Neurological:     Mental Status: She is alert and oriented to person, place, and time.  Psychiatric:        Behavior: Behavior normal.      ED Treatments / Results  Labs (all labs ordered are listed, but only abnormal results are displayed) Labs Reviewed  URINALYSIS, ROUTINE W REFLEX MICROSCOPIC - Abnormal; Notable for the following components:      Result Value   APPearance CLOUDY (*)    Leukocytes,Ua LARGE (*)    WBC, UA >50 (*)    Bacteria, UA FEW (*)    All other components within normal limits    EKG None  Radiology Dg Chest 2 View  Result Date: 06/17/2018 CLINICAL DATA:  Shortness of breath and chest pain EXAM: CHEST - 2 VIEW COMPARISON:  06/11/2018 FINDINGS: Possible changes pleural effusion on the lateral view. No consolidation. Normal heart size. No pneumothorax. IMPRESSION: Possible trace amount of pleural effusion on the lateral view. No focal airspace disease. Electronically Signed   By: Jasmine Pang M.D.   On: 06/17/2018 21:04    Procedures Procedures (including critical care time)  Medications Ordered in ED Medications  fosfomycin (MONUROL) packet 3 g (3 g Oral Given 06/19/18 0504)     Initial Impression / Assessment and Plan / ED Course  I have reviewed the triage vital signs and the nursing notes.  Pertinent labs & imaging results that were available during my care of the patient were reviewed by me and considered in my medical decision making (see chart for details).        53 year old female with history of cerebral palsy, cognitive impairment, schizoaffective disorder presents  to the emergency department for multiple complaints.  She primarily expresses suicidal and homicidal thoughts towards her sister-in-law.  These are chronic for which the patient has been seen multiple times in the emergency department.  She was most recently seen yesterday.  Prior to this encounter, was evaluated on 06/16/2018 and 06/17/2018.  Deemed appropriate for discharge by psychiatry.  Patient denies any changes to her suicidal or homicidal thoughts since last seen.  She states that she got into an argument with her sister-in-law which made her mad.  This seems to be the patient's baseline as she is easily angered.  I do not see indication for emergent TTS consultation.  Patient also complaining of nondescript, persistent chest and abdominal pain over the past 2 days.  Was noted to have persistent pyuria for which she was given fosfomycin.  She has a benign abdominal exam.  Respirations even and unlabored.  She had a full laboratory evaluation yesterday in the setting of these complaints.  Previous work-up reviewed which was without acute abnormality.  Plan for discharge and outpatient follow-up with the patient's primary care doctor and ACT team.  Patient discharged in stable condition with no unaddressed concerns.   Final Clinical Impressions(s) / ED Diagnoses   Final diagnoses:  Pyuria  Adjustment disorder with mixed disturbance of emotions and conduct    ED Discharge Orders    None       Antony Madura, PA-C 06/19/18 2229    Mancel Bale, MD 06/19/18 1113
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
## 2018-06-18 NOTE — ED Notes (Signed)
Ordered bfast tray 

## 2018-06-19 ENCOUNTER — Emergency Department (HOSPITAL_COMMUNITY)
Admission: EM | Admit: 2018-06-19 | Discharge: 2018-06-19 | Disposition: A | Discharge status: Home / Self Care | Payer: Medicare Other | Source: Home / Self Care | Attending: Emergency Medicine | Admitting: Emergency Medicine

## 2018-06-19 ENCOUNTER — Emergency Department (HOSPITAL_COMMUNITY): Payer: Medicare Other

## 2018-06-19 ENCOUNTER — Encounter (HOSPITAL_COMMUNITY): Payer: Self-pay | Admitting: Emergency Medicine

## 2018-06-19 ENCOUNTER — Other Ambulatory Visit: Payer: Self-pay

## 2018-06-19 ENCOUNTER — Encounter (HOSPITAL_COMMUNITY): Payer: Self-pay

## 2018-06-19 DIAGNOSIS — Z87891 Personal history of nicotine dependence: Secondary | ICD-10-CM

## 2018-06-19 DIAGNOSIS — R1011 Right upper quadrant pain: Secondary | ICD-10-CM

## 2018-06-19 DIAGNOSIS — G809 Cerebral palsy, unspecified: Secondary | ICD-10-CM

## 2018-06-19 DIAGNOSIS — F258 Other schizoaffective disorders: Secondary | ICD-10-CM

## 2018-06-19 DIAGNOSIS — R45851 Suicidal ideations: Secondary | ICD-10-CM | POA: Insufficient documentation

## 2018-06-19 DIAGNOSIS — J449 Chronic obstructive pulmonary disease, unspecified: Secondary | ICD-10-CM | POA: Insufficient documentation

## 2018-06-19 DIAGNOSIS — R197 Diarrhea, unspecified: Secondary | ICD-10-CM | POA: Insufficient documentation

## 2018-06-19 DIAGNOSIS — T426X1A Poisoning by other antiepileptic and sedative-hypnotic drugs, accidental (unintentional), initial encounter: Secondary | ICD-10-CM | POA: Insufficient documentation

## 2018-06-19 DIAGNOSIS — R8281 Pyuria: Secondary | ICD-10-CM | POA: Diagnosis not present

## 2018-06-19 DIAGNOSIS — G8929 Other chronic pain: Secondary | ICD-10-CM

## 2018-06-19 DIAGNOSIS — Z79899 Other long term (current) drug therapy: Secondary | ICD-10-CM

## 2018-06-19 DIAGNOSIS — Z7982 Long term (current) use of aspirin: Secondary | ICD-10-CM

## 2018-06-19 DIAGNOSIS — N390 Urinary tract infection, site not specified: Secondary | ICD-10-CM | POA: Insufficient documentation

## 2018-06-19 DIAGNOSIS — R110 Nausea: Secondary | ICD-10-CM

## 2018-06-19 DIAGNOSIS — R109 Unspecified abdominal pain: Principal | ICD-10-CM

## 2018-06-19 LAB — URINALYSIS, ROUTINE W REFLEX MICROSCOPIC
Bilirubin Urine: NEGATIVE
Bilirubin Urine: NEGATIVE
Glucose, UA: NEGATIVE mg/dL
Glucose, UA: NEGATIVE mg/dL
HGB URINE DIPSTICK: NEGATIVE
Hgb urine dipstick: NEGATIVE
Ketones, ur: NEGATIVE mg/dL
Ketones, ur: NEGATIVE mg/dL
Nitrite: NEGATIVE
Nitrite: NEGATIVE
PROTEIN: NEGATIVE mg/dL
Protein, ur: NEGATIVE mg/dL
Specific Gravity, Urine: 1.018 (ref 1.005–1.030)
Specific Gravity, Urine: 1.017 (ref 1.005–1.030)
WBC, UA: >50 WBC/hpf — ABNORMAL HIGH (ref 0–5)
pH: 6 (ref 5.0–8.0)
pH: 5 (ref 5.0–8.0)

## 2018-06-19 LAB — COMPREHENSIVE METABOLIC PANEL
ALT: 18 U/L (ref 0–44)
AST: 20 U/L (ref 15–41)
Albumin: 3.4 g/dL — ABNORMAL LOW (ref 3.5–5.0)
Alkaline Phosphatase: 39 U/L (ref 38–126)
Anion gap: 8 (ref 5–15)
BUN: 21 mg/dL — ABNORMAL HIGH (ref 6–20)
CALCIUM: 9.5 mg/dL (ref 8.9–10.3)
CO2: 27 mmol/L (ref 22–32)
Chloride: 104 mmol/L (ref 98–111)
Creatinine, Ser: 0.63 mg/dL (ref 0.44–1.00)
GFR calc non Af Amer: >60 mL/min (ref >60)
Glucose, Bld: 95 mg/dL (ref 70–99)
Potassium: 3.8 mmol/L (ref 3.5–5.1)
Sodium: 139 mmol/L (ref 135–145)
Total Bilirubin: 0.4 mg/dL (ref 0.3–1.2)
Total Protein: 6.3 g/dL — ABNORMAL LOW (ref 6.5–8.1)

## 2018-06-19 LAB — CBC WITH DIFFERENTIAL/PLATELET
Abs Immature Granulocytes: 0.02 10*3/uL (ref 0.00–0.07)
BASOS PCT: 1 %
Basophils Absolute: 0 10*3/uL (ref 0.0–0.1)
EOS ABS: 0.1 10*3/uL (ref 0.0–0.5)
Eosinophils Relative: 2 %
HCT: 37.5 % (ref 36.0–46.0)
Hemoglobin: 11.9 g/dL — ABNORMAL LOW (ref 12.0–15.0)
Immature Granulocytes: 0 %
Lymphocytes Relative: 25 %
Lymphs Abs: 1.6 10*3/uL (ref 0.7–4.0)
MCH: 32.1 pg (ref 26.0–34.0)
MCHC: 31.7 g/dL (ref 30.0–36.0)
MCV: 101.1 fL — ABNORMAL HIGH (ref 80.0–100.0)
Monocytes Absolute: 0.8 10*3/uL (ref 0.1–1.0)
Monocytes Relative: 13 %
Neutro Abs: 3.7 10*3/uL (ref 1.7–7.7)
Neutrophils Relative %: 59 %
PLATELETS: 247 10*3/uL (ref 150–400)
RBC: 3.71 MIL/uL — ABNORMAL LOW (ref 3.87–5.11)
RDW: 13.3 % (ref 11.5–15.5)
WBC: 6.4 10*3/uL (ref 4.0–10.5)
nRBC: 0 % (ref 0.0–0.2)

## 2018-06-19 LAB — RAPID URINE DRUG SCREEN, HOSP PERFORMED
Amphetamines: NOT DETECTED
Barbiturates: NOT DETECTED
Benzodiazepines: NOT DETECTED
Cocaine: NOT DETECTED
Opiates: NOT DETECTED
Tetrahydrocannabinol: NOT DETECTED

## 2018-06-19 LAB — ACETAMINOPHEN LEVEL: Acetaminophen (Tylenol), Serum: <10 ug/mL — ABNORMAL LOW (ref 10–30)

## 2018-06-19 LAB — I-STAT TROPONIN, ED: Troponin i, poc: 0 ng/mL (ref 0.00–0.08)

## 2018-06-19 LAB — LIPASE, BLOOD: LIPASE: 44 U/L (ref 11–51)

## 2018-06-19 LAB — VALPROIC ACID LEVEL: VALPROIC ACID LVL: 24 ug/mL — AB (ref 50.0–100.0)

## 2018-06-19 LAB — ETHANOL: Alcohol, Ethyl (B): <10 mg/dL (ref <10)

## 2018-06-19 LAB — SALICYLATE LEVEL

## 2018-06-19 MED ORDER — PANTOPRAZOLE SODIUM 40 MG PO TBEC
40.0000 mg | DELAYED_RELEASE_TABLET | Freq: Every day | ORAL | Status: DC
  Administered 2018-06-19: 40 mg via ORAL
  Filled 2018-06-19: qty 1

## 2018-06-19 MED ORDER — ESCITALOPRAM OXALATE 10 MG PO TABS: 20.0000 mg | ORAL_TABLET | Freq: Every day | ORAL | Status: DC

## 2018-06-19 MED ORDER — HYDROXYZINE HCL 25 MG PO TABS: 25.0000 mg | ORAL_TABLET | Freq: Four times a day (QID) | ORAL | Status: DC | PRN

## 2018-06-19 MED ORDER — LEVOTHYROXINE SODIUM 50 MCG PO TABS: 50.0000 ug | ORAL_TABLET | Freq: Every day | ORAL | Status: DC

## 2018-06-19 MED ORDER — ACETAMINOPHEN 325 MG PO TABS: 650.0000 mg | ORAL_TABLET | ORAL | Status: DC | PRN

## 2018-06-19 MED ORDER — ALBUTEROL SULFATE HFA 108 (90 BASE) MCG/ACT IN AERS: 2.0000 | INHALATION_SPRAY | RESPIRATORY_TRACT | Status: DC | PRN

## 2018-06-19 MED ORDER — OXYBUTYNIN CHLORIDE ER 5 MG PO TB24
5.0000 mg | ORAL_TABLET | Freq: Every day | ORAL | Status: DC
  Administered 2018-06-19: 5 mg via ORAL
  Filled 2018-06-19: qty 1

## 2018-06-19 MED ORDER — DIVALPROEX SODIUM 250 MG PO DR TAB
500.0000 mg | DELAYED_RELEASE_TABLET | Freq: Two times a day (BID) | ORAL | Status: DC
  Administered 2018-06-19: 500 mg via ORAL
  Filled 2018-06-19: qty 2

## 2018-06-19 MED ORDER — CLONAZEPAM 0.5 MG PO TABS
0.5000 mg | ORAL_TABLET | Freq: Two times a day (BID) | ORAL | Status: DC
  Administered 2018-06-19: 0.5 mg via ORAL
  Filled 2018-06-19: qty 1

## 2018-06-19 MED ORDER — FENOFIBRATE 160 MG PO TABS
160.0000 mg | ORAL_TABLET | Freq: Every day | ORAL | Status: DC
  Administered 2018-06-19: 160 mg via ORAL
  Filled 2018-06-19: qty 1

## 2018-06-19 MED ORDER — FOSFOMYCIN TROMETHAMINE 3 G PO PACK
3.0000 g | PACK | Freq: Once | ORAL | Status: AC
  Administered 2018-06-19: 3 g via ORAL
  Filled 2018-06-19: qty 3

## 2018-06-19 MED ORDER — RISPERIDONE 3 MG PO TABS
3.0000 mg | ORAL_TABLET | Freq: Two times a day (BID) | ORAL | Status: DC
  Administered 2018-06-19: 3 mg via ORAL
  Filled 2018-06-19: qty 1

## 2018-06-19 MED ORDER — ASPIRIN EC 81 MG PO TBEC
81.0000 mg | DELAYED_RELEASE_TABLET | Freq: Every day | ORAL | Status: DC
  Administered 2018-06-19: 81 mg via ORAL
  Filled 2018-06-19: qty 1

## 2018-06-19 MED ORDER — FAMOTIDINE 20 MG PO TABS
20.0000 mg | ORAL_TABLET | Freq: Two times a day (BID) | ORAL | Status: DC
  Administered 2018-06-19: 20 mg via ORAL
  Filled 2018-06-19: qty 1

## 2018-06-19 MED ORDER — SUCRALFATE 1 G PO TABS: 1.0000 g | ORAL_TABLET | Freq: Three times a day (TID) | ORAL | Status: DC

## 2018-06-19 NOTE — Discharge Instructions (Addendum)
We recommend that you follow-up with your primary care doctor for further evaluation of your ongoing chest and abdominal pain.  We recommend that you reach out to your ACT team for ongoing complaints about your home environment.

## 2018-06-19 NOTE — ED Notes (Signed)
Regular Diet was ordered for Breakfast. 

## 2018-06-19 NOTE — ED Notes (Signed)
Pt requested fall risk bracelet stating "I am a fall risk" This RN observed pt to ambulate to the restroom independently without difficulty after pt's arrival. This RN placed fall risk bracelet on pt's wrist per her request. Pt denies falls recently. Pt now ambulating to restroom again with Erskine Squibb EMT.

## 2018-06-19 NOTE — ED Notes (Signed)
Pt verbalized understanding of d/c instructions and has no further questions, VSS, NAD.  

## 2018-06-19 NOTE — ED Notes (Signed)
This patient admitted that she came to hospital because she is not getting along with her sister in law.  She is aware that she is being discharged and is trying to exaggerate her "overdose"  She is saying that she has now taken 7 depakote as opposed to 4 depakote.  Pt is very manipulative.

## 2018-06-19 NOTE — ED Provider Notes (Signed)
[facility] EMERGENCY DEPARTMENT Provider Note   CSN: [id number] Arrival date & time:       History   Chief Complaint Chief Complaint  Patient presents with  . Abdominal Pain  . Suicidal    HPI [name] is a 53 y.o. female.     HPI  53 year old female presents with abdominal pain, as well as suicidal and homicidal thoughts.  She states that the abdominal pain is been for over 1 year.  It is in the right upper abdomen/lower chest and goes down to her lower abdomen.  Over the last 1 week or so she has been having diarrhea about 3 times per day.  No blood.  Nausea without vomiting.  She was just seen at [facility] in the middle of the night last night and was given fosfomycin for urinary tract infection.  She states she has had burning with urination for about 3 weeks.  No fevers or vomiting.  She endorses some chest pain last night after dinner that is no longer present.  She also states she is feeling suicidal toward her sister-in-law and has been feeling this way for a couple weeks.  She states her sister-in-law is very controlling.  She also feels suicidal which she states has been an ongoing problem for about a year.  She states she would take pills to kill herself.  She hears voices telling her to kill her self and her sister-in-law.  Past Medical History:  Diagnosis Date  . Anxiety   . Asthma   . Borderline personality disorder (HCC)   . Cerebral palsy (HCC)   . COPD (chronic obstructive pulmonary disease) (HCC)   . Depression   . GERD (gastroesophageal reflux disease)   . Mild cognitive impairment   . Schizoaffective disorder (HCC)   . Wound abscess     Patient Active Problem List   Diagnosis Date Noted  . 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 Overdose by acetaminophen 08/08/2016  . Schizoaffective disorder, bipolar type (HCC)   . 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 GERD (gastroesophageal  reflux disease) 09/24/2014  . COPD (chronic obstructive pulmonary disease) (HCC) 09/24/2014  . Borderline personality disorder (HCC) 09/24/2014  . Cerebral palsy (HCC) 08/31/2014    Past Surgical History:  Procedure Laterality Date  . CHOLECYSTECTOMY    . INCISION AND DRAINAGE ABSCESS Right 01/02/2015   Procedure: INCISION AND DRAINAGE ABSCESS;  Surgeon: Tiney Rouge III, MD;  Location: [facility] ORS;  Service: General;  Laterality: Right;  . TONSILLECTOMY       OB History    Gravida  0   Para  0   Term  0   Preterm  0   AB  0   Living  0     SAB  0   TAB  0   Ectopic  0   Multiple  0   Live Births               Home Medications    Prior to Admission medications   Medication Sig Start Date End Date Taking? Authorizing Provider  albuterol (PROVENTIL HFA;VENTOLIN HFA) 108 (90 Base) MCG/ACT inhaler Inhale 2 puffs into the lungs every 4 (four) hours as needed for wheezing or shortness of breath. 06/17/18   Charm Rings, NP  aspirin EC 81 MG tablet Take 1 tablet (81 mg total) by mouth daily. 12/14/17   Clapacs, Jackquline Denmark, MD  clonazePAM (KLONOPIN) 0.5 MG tablet  Take 1 tablet (0.5 mg total) by mouth 2 (two) times daily. 12/14/17   Clapacs, Jackquline Denmark, MD  divalproex (DEPAKOTE) 500 MG DR tablet Take 1 tablet (500 mg total) by mouth 2 (two) times daily. 12/14/17   Clapacs, Jackquline Denmark, MD  escitalopram (LEXAPRO) 20 MG tablet Take 1 tablet (20 mg total) by mouth daily. 12/14/17   Clapacs, Jackquline Denmark, MD  famotidine (PEPCID) 20 MG tablet Take 1 tablet (20 mg total) by mouth 2 (two) times daily. 06/11/18   Khatri, Hina, PA-C  fenofibrate 160 MG tablet Take 1 tablet (160 mg total) by mouth daily. 12/14/17   Clapacs, Jackquline Denmark, MD  hydrOXYzine (ATARAX/VISTARIL) 25 MG tablet Take 1 tablet (25 mg total) by mouth every 6 (six) hours as needed for anxiety. Patient taking differently: Take 25 mg by mouth 2 (two) times daily.  05/11/18   Fayrene Helper, PA-C  levothyroxine (SYNTHROID, LEVOTHROID) 50 MCG tablet Take  1 tablet (50 mcg total) by mouth daily before breakfast. 12/14/17   Clapacs, Jackquline Denmark, MD  oxybutynin (DITROPAN-XL) 5 MG 24 hr tablet Take 1 tablet (5 mg total) by mouth daily. 12/14/17   Clapacs, Jackquline Denmark, MD  pantoprazole (PROTONIX) 40 MG tablet Take 1 tablet (40 mg total) by mouth daily. 12/14/17   Clapacs, Jackquline Denmark, MD  risperiDONE (RISPERDAL) 3 MG tablet Take 1 tablet (3 mg total) by mouth 2 (two) times daily. Patient taking differently: Take 3 mg by mouth daily.  12/14/17   Clapacs, Jackquline Denmark, MD  sucralfate (CARAFATE) 1 g tablet Take 1 tablet (1 g total) by mouth 4 (four) times daily -  with meals and at bedtime. Patient taking differently: Take 1 g by mouth daily.  12/14/17   Clapacs, Jackquline Denmark, MD    Family History Family History  Problem Relation Age of Onset  . Heart attack Father   . Diabetes Mother     Social History Social History   Tobacco Use  . Smoking status: Former Games developer  . Smokeless tobacco: Never Used  Substance Use Topics  . Alcohol use: No  . Drug use: No     Allergies   Penicillins   Review of Systems Review of Systems  Constitutional: Negative for fever.  Cardiovascular: Positive for chest pain.  Gastrointestinal: Positive for abdominal pain, diarrhea and nausea. Negative for blood in stool and vomiting.  Genitourinary: Positive for dysuria.  Psychiatric/Behavioral: Positive for hallucinations and suicidal ideas.  All other systems reviewed and are negative.    Physical Exam Updated Vital Signs BP 110/64 (BP Location: Right Arm)   Pulse 62   Temp 98.5 F (36.9 C) (Oral)   Resp 16   Ht  (1.6 m)   Wt 97.5 kg   LMP 05/28/2018 (LMP Unknown)   SpO2 96%   BMI 38.08 kg/m   Physical Exam Vitals signs and nursing note reviewed.  Constitutional:      Appearance: She is well-developed. She is obese. She is not ill-appearing or diaphoretic.  HENT:     Head: Normocephalic and atraumatic.     Right Ear: External ear normal.     Left Ear: External ear  normal.     Nose: Nose normal.  Eyes:     General:        Right eye: No discharge.        Left eye: No discharge.  Cardiovascular:     Rate and Rhythm: Normal rate and regular rhythm.     Heart sounds: Normal  heart sounds.  Pulmonary:     Effort: Pulmonary effort is normal.     Breath sounds: Normal breath sounds.  Abdominal:     Palpations: Abdomen is soft.     Tenderness: There is no abdominal tenderness.  Skin:    General: Skin is warm and dry.  Neurological:     Mental Status: She is alert.  Psychiatric:        Mood and Affect: Mood is not anxious.        Thought Content: Thought content includes homicidal and suicidal ideation.      ED Treatments / Results  Labs (all labs ordered are listed, but only abnormal results are displayed) Labs Reviewed  COMPREHENSIVE METABOLIC PANEL - Abnormal; Notable for the following components:      Result Value   BUN 21 (*)    Total Protein 6.3 (*)    Albumin 3.4 (*)    All other components within normal limits  URINALYSIS, ROUTINE W REFLEX MICROSCOPIC - Abnormal; Notable for the following components:   APPearance CLOUDY (*)    Leukocytes,Ua LARGE (*)    WBC, UA >50 (*)    Bacteria, UA MANY (*)    All other components within normal limits  ACETAMINOPHEN LEVEL - Abnormal; Notable for the following components:   Acetaminophen (Tylenol), Serum <10 (*)    All other components within normal limits  CBC WITH DIFFERENTIAL/PLATELET - Abnormal; Notable for the following components:   RBC 3.71 (*)    Hemoglobin 11.9 (*)    MCV 101.1 (*)    All other components within normal limits  VALPROIC ACID LEVEL - Abnormal; Notable for the following components:   Valproic Acid Lvl 24 (*)    All other components within normal limits  URINE CULTURE  ETHANOL  SALICYLATE LEVEL  LIPASE, BLOOD  RAPID URINE DRUG SCREEN, HOSP PERFORMED  I-STAT TROPONIN, ED    EKG EKG Interpretation  Date/Time:  Wednesday June 19 2018 07:31:31 EST Ventricular  Rate:  63 PR Interval:    QRS Duration: 114 QT Interval:  428 QTC Calculation: 435 R Axis:   52 Text Interpretation:  Sinus rhythm Prolonged PR interval Borderline intraventricular conduction delay Low voltage, precordial leads no significant change since Jun 18 2018 Confirmed by Pricilla Loveless [id number]) on 06/19/2018 7:51:44 AM Also confirmed by Pricilla Loveless [id number]), editor Elita Quick ([id number])  on 06/19/2018 8:02:32 AM   Radiology Dg Chest 2 View  Result Date: 06/19/2018 CLINICAL DATA:  Chest pain EXAM: CHEST - 2 VIEW COMPARISON:  Two days ago FINDINGS: Normal heart size and mediastinal contours. No acute infiltrate or edema. Continued blunting of posterior costophrenic sulci. Remote, healed right fourth rib fracture. No acute osseous findings. IMPRESSION: Trace pleural fluid as seen 2 days ago.  No new abnormality. Electronically Signed   By: Marnee Spring M.D.   On: 06/19/2018 08:15   Dg Chest 2 View  Result Date: 06/17/2018 CLINICAL DATA:  Shortness of breath and chest pain EXAM: CHEST - 2 VIEW COMPARISON:  06/11/2018 FINDINGS: Possible changes pleural effusion on the lateral view. No consolidation. Normal heart size. No pneumothorax. IMPRESSION: Possible trace amount of pleural effusion on the lateral view. No focal airspace disease. Electronically Signed   By: Jasmine Pang M.D.   On: 06/17/2018 21:04    Procedures Procedures (including critical care time)  Medications Ordered in ED Medications  albuterol (PROVENTIL HFA;VENTOLIN HFA) 108 (90 Base) MCG/ACT inhaler 2 puff (has no administration in time range)  aspirin EC  tablet 81 mg (81 mg Oral Given 06/19/18 1011)  clonazePAM (KLONOPIN) tablet 0.5 mg (0.5 mg Oral Given 06/19/18 1010)  divalproex (DEPAKOTE) DR tablet 500 mg (500 mg Oral Given 06/19/18 1010)  escitalopram (LEXAPRO) tablet 20 mg (has no administration in time range)  famotidine (PEPCID) tablet 20 mg (20 mg Oral Given 06/19/18 1010)  fenofibrate tablet 160 mg (160 mg  Oral Given 06/19/18 1010)  hydrOXYzine (ATARAX/VISTARIL) tablet 25 mg (has no administration in time range)  levothyroxine (SYNTHROID, LEVOTHROID) tablet 50 mcg (has no administration in time range)  oxybutynin (DITROPAN-XL) 24 hr tablet 5 mg (5 mg Oral Given 06/19/18 1010)  pantoprazole (PROTONIX) EC tablet 40 mg (40 mg Oral Given 06/19/18 1011)  risperiDONE (RISPERDAL) tablet 3 mg (3 mg Oral Given 06/19/18 1010)  sucralfate (CARAFATE) tablet 1 g (has no administration in time range)  acetaminophen (TYLENOL) tablet 650 mg (has no administration in time range)     Initial Impression / Assessment and Plan / ED Course  I have reviewed the triage vital signs and the nursing notes.  Pertinent labs & imaging results that were available during my care of the patient were reviewed by me and considered in my medical decision making (see chart for details).        Patient's abdominal pain is chronic.  I do not think any acute imaging is needed, especially with normal WBC and stable labs compared to her baseline.  She does have urinary tract infection on UA but she was just given the fosfomycin at 5 AM this morning at [facility].  Her psych complaints are probably chronic, given her complexity, TTS/psychiatry has evaluated patient and she is deemed stable for discharge.  She has had these complaints for quite some time and I think is probably unlikely she will injure someone else or kill herself.  Otherwise, she does not appear to have pyelonephritis given no vomiting, fever, elevated WBC.  I think is reasonable to treat her outpatient and have her follow-up with her PCP.  Final Clinical Impressions(s) / ED Diagnoses   Final diagnoses:  Chronic abdominal pain  Other schizoaffective disorders (HCC)  Acute urinary tract infection    ED Discharge Orders    None       Pricilla Loveless, MD 06/19/18 [phone]

## 2018-06-19 NOTE — ED Triage Notes (Addendum)
BIB [facility] from bus [place] [employer]. Pt discharged this am from [facility] for abdominal pain. Pt here with c/o of abdominal pain. Pt also states she is now homicidal and suicidal.

## 2018-06-19 NOTE — ED Provider Notes (Addendum)
[name]   CSN: [id number] Arrival date & time: 06/19/18  1615    History   Chief Complaint Chief Complaint  Patient presents with  . Drug Overdose    HPI [name] is a 53 y.o. female.     Patient with history of cerebral palsy, schizoaffective disorder who presents the ED after ingesting four 500 mg tablets of Depakote in order to harm herself.  Patient was seen earlier in the ED for suicidal ideation and was discharged.  Patient states that she is unhappy with her living condition as she lives with her sister-in-law who she does not get along with.  Patient continues to endorse suicidal ideation.  Denies any use of other alcohol, drugs, Tylenol, Motrin.  No chest pain, shortness of breath  The history is provided by the patient.  Drug Overdose  This is a new problem. The current episode started 1 to 2 hours ago. The problem occurs constantly. The problem has not changed since onset.Pertinent negatives include no chest pain, no abdominal pain, no headaches and no shortness of breath. Nothing aggravates the symptoms. Nothing relieves the symptoms. She has tried nothing for the symptoms. The treatment provided no relief.    Past Medical History:  Diagnosis Date  . Anxiety   . Asthma   . Borderline personality disorder (HCC)   . Cerebral palsy (HCC)   . COPD (chronic obstructive pulmonary disease) (HCC)   . Depression   . GERD (gastroesophageal reflux disease)   . Mild cognitive impairment   . Schizoaffective disorder (HCC)   . Wound abscess     Patient Active Problem List   Diagnosis Date Noted  . 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 Overdose by acetaminophen 08/08/2016  . Schizoaffective disorder, bipolar type (HCC)   . 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 GERD (gastroesophageal reflux disease) 09/24/2014  . COPD (chronic obstructive pulmonary disease) (HCC)  09/24/2014  . Borderline personality disorder (HCC) 09/24/2014  . Cerebral palsy (HCC) 08/31/2014    Past Surgical History:  Procedure Laterality Date  . CHOLECYSTECTOMY    . INCISION AND DRAINAGE ABSCESS Right 01/02/2015   Procedure: INCISION AND DRAINAGE ABSCESS;  Surgeon: Tiney Rouge III, MD;  Location: [facility] ORS;  Service: General;  Laterality: Right;  . TONSILLECTOMY       OB History    Gravida  0   Para  0   Term  0   Preterm  0   AB  0   Living  0     SAB  0   TAB  0   Ectopic  0   Multiple  0   Live Births               Home Medications    Prior to Admission medications   Medication Sig Start Date End Date Taking? Authorizing Provider  aspirin EC 81 MG tablet Take 1 tablet (81 mg total) by mouth daily. 12/14/17  Yes Clapacs, Jackquline Denmark, MD  clonazePAM (KLONOPIN) 0.5 MG tablet Take 1 tablet (0.5 mg total) by mouth 2 (two) times daily. 12/14/17  Yes Clapacs, Jackquline Denmark, MD  divalproex (DEPAKOTE) 500 MG DR tablet Take 1 tablet (500 mg total) by mouth 2 (two) times daily. 12/14/17  Yes Clapacs, Jackquline Denmark, MD  escitalopram (LEXAPRO) 20 MG tablet Take 1 tablet (20 mg total) by mouth daily. 12/14/17  Yes Clapacs, Jackquline Denmark, MD  famotidine (PEPCID) 20 MG tablet  Take 1 tablet (20 mg total) by mouth 2 (two) times daily. 06/11/18  Yes Khatri, Hina, PA-C  fenofibrate 160 MG tablet Take 1 tablet (160 mg total) by mouth daily. 12/14/17  Yes Clapacs, Jackquline Denmark, MD  hydrOXYzine (ATARAX/VISTARIL) 25 MG tablet Take 1 tablet (25 mg total) by mouth every 6 (six) hours as needed for anxiety. Patient taking differently: Take 25 mg by mouth 2 (two) times daily.  05/11/18  Yes Fayrene Helper, PA-C  levothyroxine (SYNTHROID, LEVOTHROID) 50 MCG tablet Take 1 tablet (50 mcg total) by mouth daily before breakfast. 12/14/17  Yes Clapacs, Jackquline Denmark, MD  oxybutynin (DITROPAN-XL) 5 MG 24 hr tablet Take 1 tablet (5 mg total) by mouth daily. 12/14/17  Yes Clapacs, Jackquline Denmark, MD  pantoprazole (PROTONIX) 40 MG tablet Take 1  tablet (40 mg total) by mouth daily. 12/14/17  Yes Clapacs, Jackquline Denmark, MD  risperiDONE (RISPERDAL) 3 MG tablet Take 1 tablet (3 mg total) by mouth 2 (two) times daily. Patient taking differently: Take 3 mg by mouth daily.  12/14/17  Yes Clapacs, Jackquline Denmark, MD  sucralfate (CARAFATE) 1 g tablet Take 1 tablet (1 g total) by mouth 4 (four) times daily -  with meals and at bedtime. Patient taking differently: Take 1 g by mouth daily.  12/14/17  Yes Clapacs, Jackquline Denmark, MD  albuterol (PROVENTIL HFA;VENTOLIN HFA) 108 (90 Base) MCG/ACT inhaler Inhale 2 puffs into the lungs every 4 (four) hours as needed for wheezing or shortness of breath. 06/17/18   Charm Rings, NP    Family History Family History  Problem Relation Age of Onset  . Heart attack Father   . Diabetes Mother     Social History Social History   Tobacco Use  . Smoking status: Former Games developer  . Smokeless tobacco: Never Used  Substance Use Topics  . Alcohol use: No  . Drug use: No     Allergies   Penicillins   Review of Systems Review of Systems  Constitutional: Negative for chills and fever.  HENT: Negative for ear pain and sore throat.   Eyes: Negative for pain and visual disturbance.  Respiratory: Negative for cough and shortness of breath.   Cardiovascular: Negative for chest pain and palpitations.  Gastrointestinal: Negative for abdominal pain and vomiting.  Genitourinary: Negative for dysuria and hematuria.  Musculoskeletal: Negative for arthralgias and back pain.  Skin: Negative for color change and rash.  Neurological: Negative for seizures, syncope and headaches.  Psychiatric/Behavioral: Positive for suicidal ideas.  All other systems reviewed and are negative.    Physical Exam Updated Vital Signs  ED Triage Vitals  Enc Vitals Group     BP 06/19/18 1653 (!) 107/47     Pulse Rate 06/19/18 1653 74     Resp 06/19/18 1653 20     Temp 06/19/18 1653 98.6 F (37 C)     Temp Source 06/19/18 1653 Oral     SpO2 06/19/18  1653 96 %     Weight --      Height --      Head Circumference --      Peak Flow --      Pain Score 06/19/18 1649 0     Pain Loc --      Pain Edu? --      Excl. in GC? --     Physical Exam Vitals signs and nursing Green reviewed.  Constitutional:      General: She is not in acute distress.  Appearance: Normal appearance. She is well-developed. She is not ill-appearing.  HENT:     Head: Normocephalic and atraumatic.     Nose: Nose normal.     Mouth/Throat:     Mouth: Mucous membranes are moist.  Eyes:     Extraocular Movements: Extraocular movements intact.     Conjunctiva/sclera: Conjunctivae normal.     Pupils: Pupils are equal, round, and reactive to light.  Neck:     Musculoskeletal: Normal range of motion and neck supple.  Cardiovascular:     Rate and Rhythm: Normal rate and regular rhythm.     Heart sounds: No murmur.  Pulmonary:     Effort: Pulmonary effort is normal. No respiratory distress.     Breath sounds: Normal breath sounds.  Abdominal:     General: There is no distension.     Palpations: Abdomen is soft.     Tenderness: There is no abdominal tenderness.  Musculoskeletal: Normal range of motion.  Skin:    General: Skin is warm and dry.  Neurological:     General: No focal deficit present.     Mental Status: She is alert and oriented to person, place, and time.     Cranial Nerves: No cranial nerve deficit.     Sensory: No sensory deficit.     Motor: No weakness.     Coordination: Coordination normal.  Psychiatric:        Thought Content: Thought content includes suicidal ideation. Thought content does not include homicidal ideation. Thought content includes suicidal plan. Thought content does not include homicidal plan.        Judgment: Judgment is impulsive.      ED Treatments / Results  Labs (all labs ordered are listed, but only abnormal results are displayed) Labs Reviewed - No data to display  EKG None  Radiology Dg Chest 2  View  Result Date: 06/19/2018 CLINICAL DATA:  Chest pain EXAM: CHEST - 2 VIEW COMPARISON:  Two days ago FINDINGS: Normal heart size and mediastinal contours. No acute infiltrate or edema. Continued blunting of posterior costophrenic sulci. Remote, healed right fourth rib fracture. No acute osseous findings. IMPRESSION: Trace pleural fluid as seen 2 days ago.  No new abnormality. Electronically Signed   By: Marnee Spring M.D.   On: 06/19/2018 08:15   Dg Chest 2 View  Result Date: 06/17/2018 CLINICAL DATA:  Shortness of breath and chest pain EXAM: CHEST - 2 VIEW COMPARISON:  06/11/2018 FINDINGS: Possible changes pleural effusion on the lateral view. No consolidation. Normal heart size. No pneumothorax. IMPRESSION: Possible trace amount of pleural effusion on the lateral view. No focal airspace disease. Electronically Signed   By: Jasmine Pang M.D.   On: 06/17/2018 21:04    Procedures Procedures (including critical care time)  Medications Ordered in ED Medications - No data to display   Initial Impression / Assessment and Plan / ED Course  I have reviewed the triage vital signs and the nursing notes.  Pertinent labs & imaging results that were available during my care of the patient were reviewed by me and considered in my medical decision making (see chart for details).     Aleeyah Niehaus is a 53 year old female with history of schizoaffective disorder, cognitive impairment who presents to the ED after attempt to self-harm by taking Depakote.  Patient with normal vitals.  No fever.  No symptoms.  Patient took about 2000 mg of Depakote about an hour prior to arrival in attempt to hurt her self.  She states that she is stressed out about living with her sister-in-law.  Does have ongoing suicidal ideation.  Was cleared by psychiatry earlier in the day.  Possibly this is social work related however patient does have a history of mental health problems and cognitive impairment.  She did attempt to  self-harm today however this dose of Depakote is not harmful. Will have psychiatry reevaluate.  Police was filling out IVC however will do that for them.   Prior to IVC being placed, psychiatry did evaluate the patient and overall believe that symptoms are likely due to social issues.  She appears to have manipulative past.  Psychiatry has contacted her community support team and they will pick the patient up.  IVC was canceled.  Was never officially placed.  Patient to be discharged. Has social work help.   This chart was dictated using voice recognition software.  Despite best efforts to proofread,  errors can occur which can change the documentation meaning.    Final Clinical Impressions(s) / ED Diagnoses   Final diagnoses:  Suicidal ideation    ED Discharge Orders    None       Virgina Norfolk, DO 06/19/18 1824    Virgina Norfolk, DO 06/19/18 1826

## 2018-06-19 NOTE — [facility] Counselor (Signed)
[name] [phone] from patient's [facility] CST notified of patient's return to ED. Stated she would pick her up in the next hour and a half.

## 2018-06-19 NOTE — ED Notes (Signed)
Pt cleared by [facility], provider notified and at bedside to speak with pt. Pt is to follow up with her psychiatrist outpatient. Pt understands this and agrees to treatment plan. Pt ambulated to restroom to put her clothes on. VSS, NAD.

## 2018-06-19 NOTE — Discharge Instructions (Signed)
If you develop worsening, continued, or recurrent abdominal pain, uncontrolled vomiting, fever, chest or back pain, or any other new/concerning symptoms then return to the ER for evaluation.  

## 2018-06-19 NOTE — ED Notes (Signed)
Pt pulled to progressive bed

## 2018-06-19 NOTE — ED Notes (Addendum)
Pt taken to x-ray. Pt's belongings inventoried including pink pajama pants, red polo long sleeved shirt, white bra, 2 white sneakers and 1 polo club handbag/pocketbook. Pt signed welcome to pod f form and belongings form.

## 2018-06-19 NOTE — Progress Notes (Addendum)
Patient ID: [name], female   DOB: 02-19-1966, 53 y.o.   MRN: [id number]  Pt was seen by treatment team and case discussed with Dr Sharma Covert. Pt was seen and evaluated at [facility] today. She was discharged home at approximately 1100. She told the charge RN at [facility] that she took 4 500 mg Depakote pills in an attempt to either kill herself or get attention. She lives with her mother and her sister-in-law and she does not like her sister-in-law. She has a Administrator, arts and they are going to come and pick her up around 1900. She is IDD. She is able to navigate the city bus system on her own and her CST workers have been told to make her use the skills they taught her but this time they will make an exception and come to pick her up. The CST team was also not aware she had come to [facility] after leaving [facility]. This Clinical research associate spoke with Dr Rhunette Croft and he is in agreement to discharge patient. Pt is psychiatrically clear.   Patient's chart reviewed and case discussed with the physician extender and developed treatment plan. Reviewed the information documented and agree with the treatment plan.  [name], DO 06/20/18 3:06 PM

## 2018-06-19 NOTE — ED Triage Notes (Signed)
She tells me she took "four 500mg  Depakote tablets to kill myself. It wasn't to kill myself-it was to get attention". She further tells me that she lives with her mother and sister-in-law. She states "I want to be admitted to the [facility] across the street because I can't go home". She states she can't go home because "I don't get along with my sister-in-law".
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
## 2018-06-19 NOTE — ED Notes (Addendum)
Pt here for abd pain with nausea and diarrhea that has been going on for a while. Pt also SI with plan to take pills x 1 year. Pt also homicidal towards her sister in law x 2 weeks. Pt states "she is controlling and I wish she wasn't married to my brother" Pt states "I have access to pills at home" ALso states that she had an episode of chest pain last night, no pain now.

## 2018-06-20 ENCOUNTER — Emergency Department (HOSPITAL_COMMUNITY)
Admission: EM | Admit: 2018-06-20 | Discharge: 2018-06-20 | Disposition: A | Discharge status: Home / Self Care | Payer: Medicare Other | Attending: Emergency Medicine | Admitting: Emergency Medicine

## 2018-06-20 ENCOUNTER — Other Ambulatory Visit: Payer: Self-pay

## 2018-06-20 ENCOUNTER — Encounter (HOSPITAL_COMMUNITY): Payer: Self-pay | Admitting: *Deleted

## 2018-06-20 DIAGNOSIS — Z79899 Other long term (current) drug therapy: Secondary | ICD-10-CM | POA: Insufficient documentation

## 2018-06-20 DIAGNOSIS — G809 Cerebral palsy, unspecified: Secondary | ICD-10-CM | POA: Diagnosis not present

## 2018-06-20 DIAGNOSIS — R45850 Homicidal ideations: Secondary | ICD-10-CM | POA: Diagnosis not present

## 2018-06-20 DIAGNOSIS — Z5320 Procedure and treatment not carried out because of patient's decision for unspecified reasons: Secondary | ICD-10-CM | POA: Diagnosis not present

## 2018-06-20 DIAGNOSIS — F329 Major depressive disorder, single episode, unspecified: Secondary | ICD-10-CM | POA: Diagnosis not present

## 2018-06-20 DIAGNOSIS — F259 Schizoaffective disorder, unspecified: Secondary | ICD-10-CM | POA: Insufficient documentation

## 2018-06-20 DIAGNOSIS — R45851 Suicidal ideations: Secondary | ICD-10-CM | POA: Diagnosis not present

## 2018-06-20 DIAGNOSIS — J449 Chronic obstructive pulmonary disease, unspecified: Secondary | ICD-10-CM | POA: Diagnosis not present

## 2018-06-20 LAB — RAPID URINE DRUG SCREEN, HOSP PERFORMED
Amphetamines: NOT DETECTED
Barbiturates: NOT DETECTED
Benzodiazepines: NOT DETECTED
Cocaine: NOT DETECTED
Opiates: NOT DETECTED
Tetrahydrocannabinol: NOT DETECTED

## 2018-06-20 LAB — HEPATIC FUNCTION PANEL
ALK PHOS: 38 U/L (ref 38–126)
ALT: 14 U/L (ref 0–44)
AST: 28 U/L (ref 15–41)
Albumin: 3.3 g/dL — ABNORMAL LOW (ref 3.5–5.0)
Bilirubin, Direct: 0.4 mg/dL — ABNORMAL HIGH (ref 0.0–0.2)
Indirect Bilirubin: 0.4 mg/dL (ref 0.3–0.9)
Total Bilirubin: 0.8 mg/dL (ref 0.3–1.2)
Total Protein: 6.6 g/dL (ref 6.5–8.1)

## 2018-06-20 LAB — BASIC METABOLIC PANEL
Anion gap: 9 (ref 5–15)
BUN: 16 mg/dL (ref 6–20)
CO2: 27 mmol/L (ref 22–32)
Calcium: 9.3 mg/dL (ref 8.9–10.3)
Chloride: 105 mmol/L (ref 98–111)
Creatinine, Ser: 0.84 mg/dL (ref 0.44–1.00)
GFR calc Af Amer: >60 mL/min (ref >60)
GFR calc non Af Amer: >60 mL/min (ref >60)
Glucose, Bld: 93 mg/dL (ref 70–99)
Potassium: 3.8 mmol/L (ref 3.5–5.1)
Sodium: 141 mmol/L (ref 135–145)

## 2018-06-20 LAB — CBC WITH DIFFERENTIAL/PLATELET
Abs Immature Granulocytes: 0.04 10*3/uL (ref 0.00–0.07)
Basophils Absolute: 0 10*3/uL (ref 0.0–0.1)
Basophils Relative: 1 %
Eosinophils Absolute: 0.1 10*3/uL (ref 0.0–0.5)
Eosinophils Relative: 2 %
HCT: 40.6 % (ref 36.0–46.0)
Hemoglobin: 12.6 g/dL (ref 12.0–15.0)
Immature Granulocytes: 1 %
LYMPHS ABS: 1.7 10*3/uL (ref 0.7–4.0)
Lymphocytes Relative: 22 %
MCH: 31.2 pg (ref 26.0–34.0)
MCHC: 31 g/dL (ref 30.0–36.0)
MCV: 100.5 fL — ABNORMAL HIGH (ref 80.0–100.0)
MONO ABS: 0.9 10*3/uL (ref 0.1–1.0)
Monocytes Relative: 12 %
Neutro Abs: 4.9 10*3/uL (ref 1.7–7.7)
Neutrophils Relative %: 62 %
Platelets: 277 10*3/uL (ref 150–400)
RBC: 4.04 MIL/uL (ref 3.87–5.11)
RDW: 13.2 % (ref 11.5–15.5)
WBC: 7.7 10*3/uL (ref 4.0–10.5)
nRBC: 0 % (ref 0.0–0.2)

## 2018-06-20 LAB — ACETAMINOPHEN LEVEL: Acetaminophen (Tylenol), Serum: <10 ug/mL — ABNORMAL LOW (ref 10–30)

## 2018-06-20 LAB — SALICYLATE LEVEL: Salicylate Lvl: <7 mg/dL (ref 2.8–30.0)

## 2018-06-20 LAB — VALPROIC ACID LEVEL: Valproic Acid Lvl: 57 ug/mL (ref 50.0–100.0)

## 2018-06-20 LAB — ETHANOL: Alcohol, Ethyl (B): <10 mg/dL (ref <10)

## 2018-06-20 NOTE — ED Triage Notes (Signed)
[facility]  At bed side to eval. PT.

## 2018-06-20 NOTE — ED Triage Notes (Signed)
Pharmacy Tech reported Pt does not have access to her daily meds. The Sister gives Pt meds as ordered.

## 2018-06-20 NOTE — ED Provider Notes (Signed)
[facility] EMERGENCY DEPARTMENT Provider Note   CSN: [id number] Arrival date & time: 06/20/18  1459    History   Chief Complaint Chief Complaint  Patient presents with  . Suicidal    HPI [name] is a 53 y.o. female with PMH/o borderline personality disorder, CP, COPD, depression, schizoaffective disorder comes in from EMS for evaluation of SI and overdose.  Patient reports that she took 5 Depakote pills that were 500 mg each.  Patient states that she did this because "I do not care about my life anymore."  Patient states that she has been taking these thoughts for a while.  Patient also reports that she "thinks about killing anybody who is around her."  Patient states she currently lives with her sister-in-law and mother and states that "she does not want to go back because she does not get along with her sister-in-law."  Patient states she does not use any other drugs, alcohol.  Patient denies any chest pain, difficulty breathing, abdominal pain, nausea/vomiting.     The history is provided by the patient.    Past Medical History:  Diagnosis Date  . Anxiety   . Asthma   . Borderline personality disorder (HCC)   . Cerebral palsy (HCC)   . COPD (chronic obstructive pulmonary disease) (HCC)   . Depression   . GERD (gastroesophageal reflux disease)   . Mild cognitive impairment   . Schizoaffective disorder (HCC)   . Wound abscess     Patient Active Problem List   Diagnosis Date Noted  . 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 Overdose by acetaminophen 08/08/2016  . Schizoaffective disorder, bipolar type (HCC)   . 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 GERD (gastroesophageal reflux disease) 09/24/2014  . COPD (chronic obstructive pulmonary disease) (HCC) 09/24/2014  . Borderline personality disorder (HCC) 09/24/2014  . Cerebral palsy (HCC) 08/31/2014    Past Surgical History:  Procedure Laterality  Date  . CHOLECYSTECTOMY    . INCISION AND DRAINAGE ABSCESS Right 01/02/2015   Procedure: INCISION AND DRAINAGE ABSCESS;  Surgeon: Tiney Rouge III, MD;  Location: [facility] ORS;  Service: General;  Laterality: Right;  . TONSILLECTOMY       OB History    Gravida  0   Para  0   Term  0   Preterm  0   AB  0   Living  0     SAB  0   TAB  0   Ectopic  0   Multiple  0   Live Births               Home Medications    Prior to Admission medications   Medication Sig Start Date End Date Taking? Authorizing Provider  albuterol (PROVENTIL HFA;VENTOLIN HFA) 108 (90 Base) MCG/ACT inhaler Inhale 2 puffs into the lungs every 4 (four) hours as needed for wheezing or shortness of breath. Patient not taking: Reported on 06/20/2018 06/17/18   Charm Rings, NP  aspirin EC 81 MG tablet Take 1 tablet (81 mg total) by mouth daily. 12/14/17   Clapacs, Jackquline Denmark, MD  clonazePAM (KLONOPIN) 0.5 MG tablet Take 1 tablet (0.5 mg total) by mouth 2 (two) times daily. 12/14/17   Clapacs, Jackquline Denmark, MD  divalproex (DEPAKOTE) 500 MG DR tablet Take 1 tablet (500 mg total) by mouth 2 (two) times daily. 12/14/17   Clapacs, Jackquline Denmark, MD  escitalopram (LEXAPRO) 20 MG tablet Take 1 tablet (20  mg total) by mouth daily. 12/14/17   Clapacs, Jackquline Denmark, MD  famotidine (PEPCID) 20 MG tablet Take 1 tablet (20 mg total) by mouth 2 (two) times daily. 06/11/18   Khatri, Hina, PA-C  fenofibrate 160 MG tablet Take 1 tablet (160 mg total) by mouth daily. 12/14/17   Clapacs, Jackquline Denmark, MD  hydrOXYzine (ATARAX/VISTARIL) 25 MG tablet Take 1 tablet (25 mg total) by mouth every 6 (six) hours as needed for anxiety. Patient taking differently: Take 25 mg by mouth 2 (two) times daily.  05/11/18   Fayrene Helper, PA-C  levothyroxine (SYNTHROID, LEVOTHROID) 50 MCG tablet Take 1 tablet (50 mcg total) by mouth daily before breakfast. 12/14/17   Clapacs, Jackquline Denmark, MD  oxybutynin (DITROPAN-XL) 5 MG 24 hr tablet Take 1 tablet (5 mg total) by mouth daily. 12/14/17    Clapacs, Jackquline Denmark, MD  pantoprazole (PROTONIX) 40 MG tablet Take 1 tablet (40 mg total) by mouth daily. 12/14/17   Clapacs, Jackquline Denmark, MD  risperiDONE (RISPERDAL) 3 MG tablet Take 1 tablet (3 mg total) by mouth 2 (two) times daily. Patient taking differently: Take 3 mg by mouth daily.  12/14/17   Clapacs, Jackquline Denmark, MD  sucralfate (CARAFATE) 1 g tablet Take 1 tablet (1 g total) by mouth 4 (four) times daily -  with meals and at bedtime. Patient taking differently: Take 1 g by mouth daily.  12/14/17   Clapacs, Jackquline Denmark, MD    Family History Family History  Problem Relation Age of Onset  . Heart attack Father   . Diabetes Mother     Social History Social History   Tobacco Use  . Smoking status: Former Games developer  . Smokeless tobacco: Never Used  Substance Use Topics  . Alcohol use: No  . Drug use: No     Allergies   Penicillins   Review of Systems Review of Systems  Constitutional: Negative for fever.  Respiratory: Negative for cough and shortness of breath.   Cardiovascular: Negative for chest pain.  Gastrointestinal: Negative for abdominal pain, nausea and vomiting.  Genitourinary: Negative for dysuria and hematuria.  Neurological: Negative for headaches.  Psychiatric/Behavioral: Positive for suicidal ideas.  All other systems reviewed and are negative.    Physical Exam Updated Vital Signs BP (!) 95/57   Pulse 85   Temp (!) 97.3 F (36.3 C) (Oral)   Resp 16   Ht 5\' 3"  (1.6 m)   Wt 97.5 kg   LMP 05/28/2018 (LMP Unknown)   SpO2 94%   BMI 38.08 kg/m   Physical Exam Vitals signs and nursing note reviewed.  Constitutional:      Appearance: Normal appearance. She is well-developed.  HENT:     Head: Normocephalic and atraumatic.  Eyes:     General: Lids are normal.     Conjunctiva/sclera: Conjunctivae normal.     Pupils: Pupils are equal, round, and reactive to light.  Neck:     Musculoskeletal: Full passive range of motion without pain.  Cardiovascular:     Rate and  Rhythm: Normal rate and regular rhythm.     Pulses: Normal pulses.     Heart sounds: Normal heart sounds. No murmur. No friction rub. No gallop.   Pulmonary:     Effort: Pulmonary effort is normal.     Breath sounds: Normal breath sounds.     Comments: Lungs clear to auscultation bilaterally.  Symmetric chest rise.  No wheezing, rales, rhonchi. Abdominal:     Palpations: Abdomen is soft. Abdomen  is not rigid.     Tenderness: There is no abdominal tenderness. There is no guarding.     Comments: Abdomen is soft, non-distended, non-tender. No rigidity, No guarding. No peritoneal signs.  Musculoskeletal: Normal range of motion.  Skin:    General: Skin is warm and dry.     Capillary Refill: Capillary refill takes less than 2 seconds.  Neurological:     Mental Status: She is alert and oriented to person, place, and time.  Psychiatric:        Speech: Speech normal.        Thought Content: Thought content includes homicidal and suicidal ideation. Thought content does not include homicidal or suicidal plan.      ED Treatments / Results  Labs (all labs ordered are listed, but only abnormal results are displayed) Labs Reviewed  CBC WITH DIFFERENTIAL/PLATELET - Abnormal; Notable for the following components:      Result Value   MCV 100.5 (*)    All other components within normal limits  ACETAMINOPHEN LEVEL - Abnormal; Notable for the following components:   Acetaminophen (Tylenol), Serum <10 (*)    All other components within normal limits  HEPATIC FUNCTION PANEL - Abnormal; Notable for the following components:   Albumin 3.3 (*)    Bilirubin, Direct 0.4 (*)    All other components within normal limits  BASIC METABOLIC PANEL  RAPID URINE DRUG SCREEN, HOSP PERFORMED  ETHANOL  SALICYLATE LEVEL  VALPROIC ACID LEVEL    EKG None  Radiology Dg Chest 2 View  Result Date: 06/19/2018 CLINICAL DATA:  Chest pain EXAM: CHEST - 2 VIEW COMPARISON:  Two days ago FINDINGS: Normal heart size  and mediastinal contours. No acute infiltrate or edema. Continued blunting of posterior costophrenic sulci. Remote, healed right fourth rib fracture. No acute osseous findings. IMPRESSION: Trace pleural fluid as seen 2 days ago.  No new abnormality. Electronically Signed   By: Marnee Spring M.D.   On: 06/19/2018 08:15    Procedures Procedures (including critical care time)  Medications Ordered in ED Medications - No data to display   Initial Impression / Assessment and Plan / ED Course  I have reviewed the triage vital signs and the nursing notes.  Pertinent labs & imaging results that were available during my care of the patient were reviewed by me and considered in my medical decision making (see chart for details).        53 year old female brought in by EMS for evaluation of suicidal ideations as well as drug overdose.  Patient endorses taking 5 Depakote pills over 8500 mg.  Patient states that she does not get along with her sister-in-law and does not want to go back home.  Patient was seen in the ED twice yesterday for evaluation of same symptoms.  At that time, was determined that patient was stable for discharge.  Suspect that there may be some underlying degree of manipulation given patient's undesirable living situation. Patient is afebrile, non-toxic appearing, sitting comfortably on examination table. Vital signs reviewed and stable.  Plan for medical clearance labs, TTS consultation.  CBC without any significant leukocytosis or anemia.  Acetaminophen, ethanol and salicylate level unremarkable.  Valproic acid level unremarkable.  LFTs within normal limits.    Discussed with behavioral health who states that patient is clear for discharge.  I suspect that there is a social component to patient's symptoms today.  She states that she does not want to go back and live with her sister-in-law.  At this time, I feel patient is stable for discharge.  RN informed me that patient left  prior to discharge.  Patient had no IVC in place and had been cleared by psych for discharge.  Portions of this note were generated with Scientist, clinical (histocompatibility and immunogenetics). Dictation errors may occur despite best attempts at proofreading.   Final Clinical Impressions(s) / ED Diagnoses   Final diagnoses:  Suicidal ideation    ED Discharge Orders    None       Rosana Hoes 06/20/18 2332    Tilden Fossa, MD 06/21/18 1046

## 2018-06-20 NOTE — ED Triage Notes (Signed)
Pt called EMS to home because she was SI with a plan to OD  On Depakote. Pt reports taking 2 extra Depakote  Today. Pt normally takes 2 Depakote a day . Pt took 4 pills today.

## 2018-06-20 NOTE — Progress Notes (Addendum)
Patient ID: [name], female   DOB: Apr 12, 1966, 53 y.o.   MRN: [id number]  Pt was seen and case discussed with Dr Sharma Covert. [name] is well known to the emergency room system and has been in the ED 21 times in 6 months. She is IDD and lives with her mother and her sister-in-law. She does not like her sister-in-law and will come to the emergency room and say she is suicidal. She wants to go to a group home and yesterday was told that she did not get into the group home that she wanted to go to. She was at [facility] yesterday and discharged at 1100 and then came to [facility] around 1700. She said she had taken 4 500 mg Depakote pills. Her Depakote level on 06-19-2018 was 24 which is sub therapeutic. She does have a Administrator, arts (CST) [phone]. They were contacted yesterday and did agree to come and pick Pt up and take her home, however they have been told not to pick her up as she is capable of navigating the bus line by herself. This is a skill they taught her. Collateral has been gathered from Pt's mother (please see TTS note in chart).  Pt is psychiatrically clear.    Laveda Abbe, FNP-C  06/20/2018      1642  Patient's chart reviewed and case discussed with the physician extender and developed treatment plan. Reviewed the information documented and agree with the treatment plan.  [name], DO 06/21/18 5:02 PM

## 2018-06-20 NOTE — ED Triage Notes (Signed)
Per [facility] Pt cleared to go home

## 2018-06-20 NOTE — ED Triage Notes (Signed)
Pt reports taking the extra pills on WED and still feels bad today.

## 2018-06-20 NOTE — BH Assessment (Addendum)
Assessment Note  [name] is an 53 y.o. female. The pt came in and stated she is suicidal.  This is the pt's 6th visit to the emergency room this week.  Yesterday, the pt stated she took 4 pills of Depakote.  The pt's mother was contacted and the pt's mother stated the pt's sister in law handles the medicine and the pt didn't take 4 pills yesterday.  The pt's mother was aware the pt was at the hospital, but not aware when she calls the emergency room.  The pt stated she want's to kill herself, because she doesn't get along with her sister in law.  The pt also stated she wants to kill her sister in law.  The pt denies having a plan.  She currently is with [facility].  The pt lives with her mother, sister in law,  and nephew.  .  She denies a history of self harm, legal issues and abuse.  She stated she sees things flying around and hears voices telling her to kill her family.  The pt stated she sleeps well, when not with her family and has a poor appetite.  She denies SA.  Pt is dressed in scrubs. She is alert and oriented x4. Pt speaks in a clear tone, at moderate volume and normal pace. Eye contact is good. Pt's mood is depressed. Thought process is coherent and relevant. There is no indication Pt is currently responding to internal stimuli or experiencing delusional thought content.?Pt was cooperative throughout assessment.   Diagnosis:  F33.3 Major depressive disorder, Recurrent episode, With psychotic features  Past Medical History:  Past Medical History:  Diagnosis Date  . Anxiety   . Asthma   . Borderline personality disorder (HCC)   . Cerebral palsy (HCC)   . COPD (chronic obstructive pulmonary disease) (HCC)   . Depression   . GERD (gastroesophageal reflux disease)   . Mild cognitive impairment   . Schizoaffective disorder (HCC)   . Wound abscess     Past Surgical History:  Procedure Laterality Date  . CHOLECYSTECTOMY    . INCISION AND DRAINAGE ABSCESS Right 01/02/2015    Procedure: INCISION AND DRAINAGE ABSCESS;  Surgeon: Tiney Rougealph Ely III, MD;  Location: [facility] ORS;  Service: General;  Laterality: Right;  . TONSILLECTOMY      Family History:  Family History  Problem Relation Age of Onset  . Heart attack Father   . Diabetes Mother     Social History:  reports that she has quit smoking. She has never used smokeless tobacco. She reports that she does not drink alcohol or use drugs.  Additional Social History:  Alcohol / Drug Use Pain Medications: See MAR Prescriptions: See MAR Over the Counter: See MAR History of alcohol / drug use?: No history of alcohol / drug abuse Longest period of sobriety (when/how long): NA  CIWA: CIWA-Ar BP: (!) 95/57 Pulse Rate: 85 Nausea and Vomiting: no nausea and no vomiting Tactile Disturbances: none Tremor: no tremor Auditory Disturbances: not present Paroxysmal Sweats: no sweat visible Visual Disturbances: not present Anxiety: no anxiety, at ease Headache, Fullness in Head: none present Agitation: normal activity Orientation and Clouding of Sensorium: oriented and can do serial additions CIWA-Ar Total: 0 COWS:    Allergies:  Allergies  Allergen Reactions  . Penicillins Nausea And Vomiting and Other (See Comments)    Has patient had a PCN reaction causing immediate rash, facial/tongue/throat swelling, SOB or lightheadedness with hypotension: No Has patient had a PCN reaction causing  severe rash involving mucus membranes or skin necrosis: No Has patient had a PCN reaction that required hospitalization No Has patient had a PCN reaction occurring within the last 10 years: No If all of the above answers are "NO", then may proceed with Cephalosporin use      Home Medications: (Not in a hospital admission)   OB/GYN Status:  Patient's last menstrual period was 05/28/2018 (lmp unknown).  General Assessment Data Location of Assessment: [facility] ED TTS Assessment: In system Is this a Tele or Face-to-Face Assessment?:  Face-to-Face Is this an Initial Assessment or a Re-assessment for this encounter?: Initial Assessment Patient Accompanied by:: N/A Language Other than English: No Living Arrangements: Other (Comment)(home) What gender do you identify as?: Female Marital status: Single Maiden name: Kullman Pregnancy Status: No Living Arrangements: Parent, Other relatives Can pt return to current living arrangement?: Yes Admission Status: Voluntary Is patient capable of signing voluntary admission?: Yes Referral Source: Self/Family/Friend Insurance type: Medicare     Crisis Care Plan Living Arrangements: Parent, Other relatives Legal Guardian: Other:(Self) Name of Psychiatrist: [facility] Name of Therapist: [facility]  Education Status Is patient currently in school?: No Is the patient employed, unemployed or receiving disability?: Receiving disability income  Risk to self with the past 6 months Suicidal Ideation: Yes-Currently Present Has patient been a risk to self within the past 6 months prior to admission? : Yes Suicidal Intent: Yes-Currently Present Has patient had any suicidal intent within the past 6 months prior to admission? : Yes Is patient at risk for suicide?: Yes Suicidal Plan?: Yes-Currently Present Has patient had any suicidal plan within the past 6 months prior to admission? : Yes Specify Current Suicidal Plan: take more medication Access to Means: Yes Specify Access to Suicidal Means: has pills What has been your use of drugs/alcohol within the last 12 months?: pt denies Previous Attempts/Gestures: Yes How many times?: (multiple ) Other Self Harm Risks: none Triggers for Past Attempts: None known Intentional Self Injurious Behavior: None Comment - Self Injurious Behavior: pt denies Family Suicide History: Unknown Recent stressful life event(s): Conflict (Comment)(arguments with her sister) Persecutory voices/beliefs?: No Depression: Yes Depression Symptoms: Despondent,  Insomnia, Loss of interest in usual pleasures, Feeling worthless/self pity Substance abuse history and/or treatment for substance abuse?: No Suicide prevention information given to non-admitted patients: Yes  Risk to Others within the past 6 months Homicidal Ideation: Yes-Currently Present Does patient have any lifetime risk of violence toward others beyond the six months prior to admission? : No Thoughts of Harm to Others: No Comment - Thoughts of Harm to Others: Sister in Social worker Current Homicidal Intent: No Current Homicidal Plan: No Describe Current Homicidal Plan: none Access to Homicidal Means: No Describe Access to Homicidal Means: pt denies Identified Victim: sister in law History of harm to others?: No Assessment of Violence: None Noted Does patient have access to weapons?: No Criminal Charges Pending?: No Does patient have a court date: No Is patient on probation?: No  Psychosis Hallucinations: Visual, Auditory(sees flies and things telling her to kill herself) Delusions: None noted  Mental Status Report Appearance/Hygiene: In scrubs, Unremarkable Eye Contact: Good Motor Activity: Freedom of movement, Unremarkable Speech: Logical/coherent Level of Consciousness: Alert Mood: Sad, Helpless Affect: Sad Anxiety Level: None Thought Processes: Coherent, Relevant Judgement: Impaired Orientation: Person, Place, Time, Situation Obsessive Compulsive Thoughts/Behaviors: None  Cognitive Functioning Concentration: Normal Memory: Recent Intact, Remote Intact Is patient IDD: Yes Level of Function: unknown Is IQ score available?: No Insight: Poor Impulse Control:  Poor Appetite: Poor Have you had any weight changes? : No Change Sleep: Decreased Total Hours of Sleep: 4 Vegetative Symptoms: None  ADLScreening [facility] Assessment Services) Patient's cognitive ability adequate to safely complete daily activities?: Yes Patient able to express need for assistance with ADLs?:  Yes Independently performs ADLs?: Yes (appropriate for developmental age)  Prior Inpatient Therapy Prior Inpatient Therapy: Yes Prior Therapy Dates: Per chart, "multiple."  Prior Therapy Facilty/Provider(s): Per chart, "multiple."  Reason for Treatment: Per chart, "IDD and Depression."   Prior Outpatient Therapy Prior Outpatient Therapy: Yes Prior Therapy Dates: Ongoing Prior Therapy Facilty/Provider(s): [facility] Reason for Treatment: Medication management and counseling.  Does patient have an ACCT team?: No Does patient have Intensive In-House Services?  : No Does patient have [facility] services? : No Does patient have P4CC services?: No  ADL Screening (condition at time of admission) Patient's cognitive ability adequate to safely complete daily activities?: Yes Patient able to express need for assistance with ADLs?: Yes Independently performs ADLs?: Yes (appropriate for developmental age)       Abuse/Neglect Assessment (Assessment to be complete while patient is alone) Abuse/Neglect Assessment Can Be Completed: Yes Physical Abuse: Denies Verbal Abuse: Denies Sexual Abuse: Denies Exploitation of patient/patient's resources: Denies Self-Neglect: Denies Values / Beliefs Cultural Requests During Hospitalization: None Spiritual Requests During Hospitalization: None Consults Spiritual Care Consult Needed: No Social Work Consult Needed: No Merchant navy officer (For Healthcare) Does Patient Have a Medical Advance Directive?: No Would patient like information on creating a medical advance directive?: No - Patient declined          Disposition:  Disposition Initial Assessment Completed for this Encounter: Yes  NP Elta Guadeloupe recommends the pt be discharged and follow up with OPT.  RN and MD were made aware of the recommendations.  On Site Evaluation by:   Reviewed with Physician:    Ottis Stain 06/20/2018 4:57 PM

## 2018-06-20 NOTE — ED Triage Notes (Signed)
DR Madilyn Hook made aware Pt walked out  Before Blood work resulted.

## 2018-06-20 NOTE — ED Notes (Signed)
Patient verbalizes understanding of discharge instructions . Opportunity for questions and answers were provided . Armband removed by staff ,Pt discharged from ED. W/C  offered at D/C  and Declined W/C at D/C and was escorted to lobby by RN.  

## 2018-06-21 ENCOUNTER — Emergency Department (HOSPITAL_COMMUNITY)
Admission: EM | Admit: 2018-06-21 | Discharge: 2018-06-21 | Disposition: A | Discharge status: Home / Self Care | Payer: Medicare Other | Attending: Emergency Medicine | Admitting: Emergency Medicine

## 2018-06-21 ENCOUNTER — Encounter (HOSPITAL_COMMUNITY): Payer: Self-pay | Admitting: *Deleted

## 2018-06-21 ENCOUNTER — Other Ambulatory Visit: Payer: Self-pay

## 2018-06-21 ENCOUNTER — Emergency Department (HOSPITAL_COMMUNITY)
Admission: EM | Admit: 2018-06-21 | Discharge: 2018-06-22 | Disposition: A | Discharge status: Home / Self Care | Payer: Medicare Other | Source: Home / Self Care | Attending: Emergency Medicine | Admitting: Emergency Medicine

## 2018-06-21 DIAGNOSIS — F70 Mild intellectual disabilities: Secondary | ICD-10-CM

## 2018-06-21 DIAGNOSIS — F329 Major depressive disorder, single episode, unspecified: Secondary | ICD-10-CM | POA: Insufficient documentation

## 2018-06-21 DIAGNOSIS — Z79899 Other long term (current) drug therapy: Secondary | ICD-10-CM | POA: Insufficient documentation

## 2018-06-21 DIAGNOSIS — G809 Cerebral palsy, unspecified: Secondary | ICD-10-CM | POA: Insufficient documentation

## 2018-06-21 DIAGNOSIS — Z87891 Personal history of nicotine dependence: Secondary | ICD-10-CM | POA: Insufficient documentation

## 2018-06-21 DIAGNOSIS — R45851 Suicidal ideations: Secondary | ICD-10-CM | POA: Insufficient documentation

## 2018-06-21 DIAGNOSIS — J449 Chronic obstructive pulmonary disease, unspecified: Secondary | ICD-10-CM | POA: Insufficient documentation

## 2018-06-21 DIAGNOSIS — Z7982 Long term (current) use of aspirin: Secondary | ICD-10-CM | POA: Insufficient documentation

## 2018-06-21 DIAGNOSIS — R45850 Homicidal ideations: Secondary | ICD-10-CM

## 2018-06-21 DIAGNOSIS — F251 Schizoaffective disorder, depressive type: Secondary | ICD-10-CM

## 2018-06-21 LAB — CBC
HCT: 43.2 % (ref 36.0–46.0)
HCT: 39 % (ref 36.0–46.0)
Hemoglobin: 13.3 g/dL (ref 12.0–15.0)
Hemoglobin: 12 g/dL (ref 12.0–15.0)
MCH: 32 pg (ref 26.0–34.0)
MCH: 30.8 pg (ref 26.0–34.0)
MCHC: 30.8 g/dL (ref 30.0–36.0)
MCHC: 30.8 g/dL (ref 30.0–36.0)
MCV: 104.1 fL — ABNORMAL HIGH (ref 80.0–100.0)
MCV: 100.3 fL — ABNORMAL HIGH (ref 80.0–100.0)
PLATELETS: 275 10*3/uL (ref 150–400)
Platelets: 276 10*3/uL (ref 150–400)
RBC: 4.15 MIL/uL (ref 3.87–5.11)
RBC: 3.89 MIL/uL (ref 3.87–5.11)
RDW: 13.4 % (ref 11.5–15.5)
RDW: 13.4 % (ref 11.5–15.5)
WBC: 7.6 10*3/uL (ref 4.0–10.5)
WBC: 7.3 10*3/uL (ref 4.0–10.5)
nRBC: 0 % (ref 0.0–0.2)
nRBC: 0 % (ref 0.0–0.2)

## 2018-06-21 LAB — COMPREHENSIVE METABOLIC PANEL
ALT: 18 U/L (ref 0–44)
ALT: 17 U/L (ref 0–44)
AST: 22 U/L (ref 15–41)
AST: 21 U/L (ref 15–41)
Albumin: 3.9 g/dL (ref 3.5–5.0)
Albumin: 3.6 g/dL (ref 3.5–5.0)
Alkaline Phosphatase: 45 U/L (ref 38–126)
Alkaline Phosphatase: 40 U/L (ref 38–126)
Anion gap: 8 (ref 5–15)
Anion gap: 8 (ref 5–15)
BILIRUBIN TOTAL: 0.7 mg/dL (ref 0.3–1.2)
BUN: 23 mg/dL — ABNORMAL HIGH (ref 6–20)
BUN: 19 mg/dL (ref 6–20)
CO2: 27 mmol/L (ref 22–32)
CO2: 26 mmol/L (ref 22–32)
Calcium: 9.1 mg/dL (ref 8.9–10.3)
Calcium: 9.6 mg/dL (ref 8.9–10.3)
Chloride: 106 mmol/L (ref 98–111)
Chloride: 109 mmol/L (ref 98–111)
Creatinine, Ser: 0.74 mg/dL (ref 0.44–1.00)
Creatinine, Ser: 0.82 mg/dL (ref 0.44–1.00)
GFR calc Af Amer: >60 mL/min (ref >60)
GFR calc non Af Amer: >60 mL/min (ref >60)
Glucose, Bld: 94 mg/dL (ref 70–99)
Glucose, Bld: 99 mg/dL (ref 70–99)
Potassium: 3.9 mmol/L (ref 3.5–5.1)
Potassium: 3.9 mmol/L (ref 3.5–5.1)
Sodium: 141 mmol/L (ref 135–145)
Sodium: 143 mmol/L (ref 135–145)
Total Bilirubin: 0.4 mg/dL (ref 0.3–1.2)
Total Protein: 7.2 g/dL (ref 6.5–8.1)
Total Protein: 6.8 g/dL (ref 6.5–8.1)

## 2018-06-21 LAB — SALICYLATE LEVEL: Salicylate Lvl: <7 mg/dL (ref 2.8–30.0)

## 2018-06-21 LAB — I-STAT BETA HCG BLOOD, ED (MC, WL, AP ONLY): I-stat hCG, quantitative: <5 m[IU]/mL (ref <5)

## 2018-06-21 LAB — RAPID URINE DRUG SCREEN, HOSP PERFORMED
Amphetamines: NOT DETECTED
Barbiturates: NOT DETECTED
Benzodiazepines: NOT DETECTED
Cocaine: NOT DETECTED
Opiates: NOT DETECTED
Tetrahydrocannabinol: NOT DETECTED

## 2018-06-21 LAB — URINE CULTURE: Culture: >=100000 — AB

## 2018-06-21 LAB — ACETAMINOPHEN LEVEL
Acetaminophen (Tylenol), Serum: <10 ug/mL — ABNORMAL LOW (ref 10–30)
Acetaminophen (Tylenol), Serum: <10 ug/mL — ABNORMAL LOW (ref 10–30)

## 2018-06-21 LAB — ETHANOL
Alcohol, Ethyl (B): <10 mg/dL (ref <10)
Alcohol, Ethyl (B): <10 mg/dL (ref <10)

## 2018-06-21 NOTE — ED Triage Notes (Signed)
Per EMS pt coming from home with a c/o SI/HI. Pt told the [employer] officer she will take his weapon to shoot people around her. Per EMS Pt was discharged yesterday from [facility] (same complaint).

## 2018-06-21 NOTE — ED Notes (Signed)
Bed: EU23 Expected date:  Expected time:  Means of arrival:  Comments: EMS-SI

## 2018-06-21 NOTE — ED Provider Notes (Signed)
[facility]-EMERGENCY DEPT Provider Note   CSN: [id number] Arrival date & time: 06/21/18  1251    History   Chief Complaint Chief Complaint  Patient presents with  . Suicidal    HPI [name] is a 53 y.o. female.     53 y.o female with a PMH of COPD, GERD, Anxiety presents to the ED with a chief complaint of suicidal ideations along with homicidal ideations.  Patient was discharged from [facility] ED yesterday due to the same complaint.  Today she reports "I have guns and I will kill people, you just wait until I have my guns ".  Patient denies any chest pain, shortness of breath at baseline, abdominal pain or other complaints.  According to her visit yesterday patient had taken some Depakote was cleared by psychiatry and had eloped prior to discharge.  She was not under IVC yesterday.     Past Medical History:  Diagnosis Date  . Anxiety   . Asthma   . Borderline personality disorder (HCC)   . Cerebral palsy (HCC)   . COPD (chronic obstructive pulmonary disease) (HCC)   . Depression   . GERD (gastroesophageal reflux disease)   . Mild cognitive impairment   . Schizoaffective disorder (HCC)   . Wound abscess     Patient Active Problem List   Diagnosis Date Noted  . 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 Overdose by acetaminophen 08/08/2016  . Schizoaffective disorder, bipolar type (HCC)   . 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 GERD (gastroesophageal reflux disease) 09/24/2014  . COPD (chronic obstructive pulmonary disease) (HCC) 09/24/2014  . Borderline personality disorder (HCC) 09/24/2014  . Cerebral palsy (HCC) 08/31/2014    Past Surgical History:  Procedure Laterality Date  . CHOLECYSTECTOMY    . INCISION AND DRAINAGE ABSCESS Right 01/02/2015   Procedure: INCISION AND DRAINAGE ABSCESS;  Surgeon: Tiney Rouge III, MD;  Location: [facility] ORS;  Service: General;  Laterality: Right;  .  TONSILLECTOMY       OB History    Gravida  0   Para  0   Term  0   Preterm  0   AB  0   Living  0     SAB  0   TAB  0   Ectopic  0   Multiple  0   Live Births               Home Medications    Prior to Admission medications   Medication Sig Start Date End Date Taking? Authorizing Provider  aspirin EC 81 MG tablet Take 1 tablet (81 mg total) by mouth daily. 12/14/17  Yes Clapacs, Jackquline Denmark, MD  clonazePAM (KLONOPIN) 0.5 MG tablet Take 1 tablet (0.5 mg total) by mouth 2 (two) times daily. 12/14/17  Yes Clapacs, Jackquline Denmark, MD  divalproex (DEPAKOTE) 500 MG DR tablet Take 1 tablet (500 mg total) by mouth 2 (two) times daily. 12/14/17  Yes Clapacs, Jackquline Denmark, MD  escitalopram (LEXAPRO) 20 MG tablet Take 1 tablet (20 mg total) by mouth daily. 12/14/17  Yes Clapacs, Jackquline Denmark, MD  famotidine (PEPCID) 20 MG tablet Take 1 tablet (20 mg total) by mouth 2 (two) times daily. 06/11/18  Yes Khatri, Hina, PA-C  fenofibrate 160 MG tablet Take 1 tablet (160 mg total) by mouth daily. 12/14/17  Yes Clapacs, Jackquline Denmark, MD  hydrOXYzine (ATARAX/VISTARIL) 25 MG tablet Take 1 tablet (25 mg total) by mouth every 6 (  six) hours as needed for anxiety. Patient taking differently: Take 25 mg by mouth 2 (two) times daily.  05/11/18  Yes Fayrene Helper, PA-C  albuterol (PROVENTIL HFA;VENTOLIN HFA) 108 (90 Base) MCG/ACT inhaler Inhale 2 puffs into the lungs every 4 (four) hours as needed for wheezing or shortness of breath. Patient not taking: Reported on 06/21/2018 06/17/18   Charm Rings, NP  levothyroxine (SYNTHROID, LEVOTHROID) 50 MCG tablet Take 1 tablet (50 mcg total) by mouth daily before breakfast. 12/14/17   Clapacs, Jackquline Denmark, MD  oxybutynin (DITROPAN-XL) 5 MG 24 hr tablet Take 1 tablet (5 mg total) by mouth daily. 12/14/17   Clapacs, Jackquline Denmark, MD  pantoprazole (PROTONIX) 40 MG tablet Take 1 tablet (40 mg total) by mouth daily. 12/14/17   Clapacs, Jackquline Denmark, MD  risperiDONE (RISPERDAL) 3 MG tablet Take 1 tablet (3 mg total) by  mouth 2 (two) times daily. Patient taking differently: Take 3 mg by mouth daily.  12/14/17   Clapacs, Jackquline Denmark, MD  sucralfate (CARAFATE) 1 g tablet Take 1 tablet (1 g total) by mouth 4 (four) times daily -  with meals and at bedtime. Patient taking differently: Take 1 g by mouth daily.  12/14/17   Clapacs, Jackquline Denmark, MD    Family History Family History  Problem Relation Age of Onset  . Heart attack Father   . Diabetes Mother     Social History Social History   Tobacco Use  . Smoking status: Former Games developer  . Smokeless tobacco: Never Used  Substance Use Topics  . Alcohol use: No  . Drug use: No     Allergies   Penicillins   Review of Systems Review of Systems  Constitutional: Negative for chills and fever.  HENT: Negative for ear pain and sore throat.   Eyes: Negative for pain and visual disturbance.  Respiratory: Negative for cough and shortness of breath.   Cardiovascular: Negative for chest pain and palpitations.  Gastrointestinal: Negative for abdominal pain and vomiting.  Genitourinary: Negative for dysuria and hematuria.  Musculoskeletal: Negative for arthralgias and back pain.  Skin: Negative for color change and rash.  Neurological: Negative for seizures and syncope.  Psychiatric/Behavioral: Positive for behavioral problems and suicidal ideas. Negative for hallucinations.  All other systems reviewed and are negative.    Physical Exam Updated Vital Signs BP 111/74 (BP Location: Left Arm)   Pulse 79   Temp 97.6 F (36.4 C) (Oral)   Resp 16   LMP 05/28/2018 (LMP Unknown)   SpO2 92%   Physical Exam Vitals signs and nursing note reviewed.  Constitutional:      General: She is not in acute distress.    Appearance: She is well-developed.  HENT:     Head: Normocephalic and atraumatic.     Mouth/Throat:     Pharynx: No oropharyngeal exudate.  Eyes:     Pupils: Pupils are equal, round, and reactive to light.  Neck:     Musculoskeletal: Normal range of motion.   Cardiovascular:     Rate and Rhythm: Regular rhythm.     Heart sounds: Normal heart sounds.  Pulmonary:     Effort: Pulmonary effort is normal. No respiratory distress.     Breath sounds: No wheezing or rhonchi.     Comments: Lungs sounds decreased.  Abdominal:     General: Bowel sounds are normal. There is no distension.     Palpations: Abdomen is soft.     Tenderness: There is no abdominal tenderness.  Musculoskeletal:        General: No tenderness or deformity.     Right lower leg: No edema.     Left lower leg: No edema.  Skin:    General: Skin is warm and dry.  Neurological:     Mental Status: She is alert and oriented to person, place, and time.  Psychiatric:        Attention and Perception: She does not perceive auditory or visual hallucinations.        Thought Content: Thought content includes homicidal and suicidal ideation. Thought content includes homicidal and suicidal plan.     Comments: Reports no set plan on SI, but states she has guns to commit a crime.      ED Treatments / Results  Labs (all labs ordered are listed, but only abnormal results are displayed) Labs Reviewed  COMPREHENSIVE METABOLIC PANEL - Abnormal; Notable for the following components:      Result Value   BUN 23 (*)    All other components within normal limits  ACETAMINOPHEN LEVEL - Abnormal; Notable for the following components:   Acetaminophen (Tylenol), Serum <10 (*)    All other components within normal limits  CBC - Abnormal; Notable for the following components:   MCV 104.1 (*)    All other components within normal limits  ETHANOL  SALICYLATE LEVEL  RAPID URINE DRUG SCREEN, HOSP PERFORMED  I-STAT BETA HCG BLOOD, ED ([facility], [facility], [facility] ONLY)    EKG None  Radiology No results found.  Procedures Procedures (including critical care time)  Medications Ordered in ED Medications - No data to display   Initial Impression / Assessment and Plan / ED Course  I have reviewed the triage  vital signs and the nursing notes.  Pertinent labs & imaging results that were available during my care of the patient were reviewed by me and considered in my medical decision making (see chart for details).       Patient with recurrent visit for SI, seen at [facility] yesterday and discharge cleared by psychiatry and had eloped prior to discharge.  She was not under IVC.  Returns today with complaints of HI, states "I have guns and I will shoot ".  CBC showed no leukocytosis, hemoglobin is within normal limits.  CMP showed no elevation of LFTs, no electrolyte abnormality.  Creatinine stable.  Pending acetaminophen and salicylate level. Patient did have some decreased lung sounds on examination, according to her chart chest x-ray performed 2 days ago which showed a trace of pleural fluid.  3:22 PM spoke to TTS counselor who has seen and consulted on patient for the past 3 visits during the past week.  She reports patient has outpatient resources set up, she will be picked up by [facility] as she does not meet inpatient psychiatric treatment at this time.  Patient is medically and psychiatrically clear at this time.  Patient will be discharged and picked up by her C[facility] at this time.  Stable vital signs.  Final Clinical Impressions(s) / ED Diagnoses   Final diagnoses:  Suicidal ideation    ED Discharge Orders    None       Claude Manges, PA-C 06/21/18 1523    Tegeler, Canary Brim, MD 06/21/18 [phone]

## 2018-06-21 NOTE — ED Triage Notes (Signed)
Pt called out for body aches all over and once EMS arrived, she threatened that if she had a gun she would shoot them, or anyone. Reports HI 110 palpated BP, 72 Pulse.

## 2018-06-21 NOTE — Progress Notes (Addendum)
Patient ID: [name], female   DOB: Sep 15, 1965, 53 y.o.   MRN: [id number]  Pt was seen and chart reviewed with treatment team and Dr Sharma Covert. Pt is well known to this emergency room system. She has had 22 visits in 6 months and this is her 4th visit in 48 hours. [facility] was at [facility] on [facility]/4 and at [facility] on 3/5 and then today is at [facility]. She has the same presentation each time, saying she is suicidal and will overdose or that she has guns and will shoot people.  She stated she wants to live at the hospital.This writer attempted to educate the Pt regarding the hospital is for sick patient's and she is not sick. She has a day program she can attend but she often refuses to go there. This Clinical research associate spoke with Artist Pais with her CST team at [phone] and she also stated that they are doing their very best to try and help the patient but they can only do so much for her. Artist Pais also assured this Clinical research associate that pt has no guns and no access to guns.  Pt will once again be discharged and her CST worker will come to get her. RN in TCU has been informed not to let Pt leave the ED until CST has come to get her.  Pt was allowed to walk away from [facility] AMA.   Laveda Abbe, FNP-C 06/21/2018      1534  Patient's chart reviewed and case discussed with the physician extender and developed treatment plan. Reviewed the information documented and agree with the treatment plan.  [name], DO 06/21/18 5:05 PM

## 2018-06-22 ENCOUNTER — Telehealth: Payer: Self-pay | Admitting: Emergency Medicine

## 2018-06-22 DIAGNOSIS — F329 Major depressive disorder, single episode, unspecified: Secondary | ICD-10-CM | POA: Diagnosis not present

## 2018-06-22 NOTE — ED Provider Notes (Signed)
Patient cleared by psychiatry.  Her significant other is here to pick her up.  Labs and vitals reviewed.  No apparent acute emergent medical condition.   Shaune Pollack, MD 06/22/18 [phone]

## 2018-06-22 NOTE — Progress Notes (Signed)
ED Antimicrobial Stewardship Positive Culture Follow Up   [name] is an 53 y.o. female who presented to [facility] on 06/21/2018 with a chief complaint of  Chief Complaint  Patient presents with  . Homicidal    Recent Results (from the past 720 hour(s))  Urine culture     Status: Abnormal   Collection Time: 06/19/18  7:32 AM  Result Value Ref Range Status   Specimen Description URINE, CLEAN CATCH  Final   Special Requests   Final    NONE Performed at [facility], [address], [place], [place] [address]    Culture >=100,000 COLONIES/mL KLEBSIELLA PNEUMONIAE (A)  Final   Report Status 06/21/2018 FINAL  Final   Organism ID, Bacteria KLEBSIELLA PNEUMONIAE (A)  Final      Susceptibility   Klebsiella pneumoniae - MIC*    AMPICILLIN RESISTANT Resistant     CEFAZOLIN <=4 SENSITIVE Sensitive     CEFTRIAXONE <=1 SENSITIVE Sensitive     CIPROFLOXACIN <=0.25 SENSITIVE Sensitive     GENTAMICIN <=1 SENSITIVE Sensitive     IMIPENEM <=0.25 SENSITIVE Sensitive     NITROFURANTOIN <=16 SENSITIVE Sensitive     TRIMETH/SULFA <=20 SENSITIVE Sensitive     AMPICILLIN/SULBACTAM <=2 SENSITIVE Sensitive     PIP/TAZO <=4 SENSITIVE Sensitive     Extended ESBL NEGATIVE Sensitive     * >=100,000 COLONIES/mL KLEBSIELLA PNEUMONIAE   No symptoms of uti, no abx indicated  ED Provider: Eyvonne Mechanic, PA-C  [name] 06/22/2018, 9:03 AM Clinical Pharmacist Monday - Friday phone -  [phone] Saturday - Sunday phone - [phone]

## 2018-06-22 NOTE — ED Notes (Signed)
[facility] counselor contacted this RN to advise that they recommend that patient be reevaluated by psychiatrist in the am. Mia, PA also made aware of disposition by [facility] counselor.

## 2018-06-22 NOTE — BH Assessment (Addendum)
Tele Assessment Note   Patient Name: [name] MRN: [id number] Referring Physician: Frederik PearMia McDonald, PA-C Location of Patient: [facility], [phone] Location of Provider: Behavioral Health TTS Department  [name] is an 53 y.o. single female who presents unaccompanied to [facility] reporting suicidal and homicidal ideation. Pt has a diagnosis of schizoaffective disorder and intellectual disability and has been coming to [facility] ED repeatedly over the past week with similar presentation. Pt was discharged from [facility] and returned in less than four hours by calling EMS, reporting body aches and then verbalizing suicidal and homicidal ideation. She repeatedly says "I'm violent as hell." She says she is going to get the gun from a Hydrographic surveyorlaw enforcement officer. Pt lists on her finger all the people she is going to kill including her family, her neighbor, EMS staff, Patent examinerlaw enforcement, hospital staff, this TTS counselor and herself. Pt says she took four tabs of Depakote three days ago in a suicide attempt. She says "I demand to be hospitalized!" and says if she is discharged home "I'm going to come right back." She says she needs to be in a hospital "so I can calm down." Pt reports she is seeing "flies" and hearing voices telling her to kill her family. She says she is sleeping four hours per night and states her appetite is poor. Pt denies alcohol or substance use.  Pt cannot identify anything that is putting her under stress. Pt lives with her mother, sister in law, and nephew. She denies current legal problems. She denies history of abuse.   Pt is currently receiving outpatient medication management and Merchandiser, retailCommunity Support Team services through [facility]. Per Pt's medical record, Dr. Juanetta BeetsJacqueline Norman spoke with Sueanne MargaritaShawna Little with her CST team at [phone] and Ms LIttle also stated that they are doing their very best to try and help the patient but they can only do so much for her. Pt states  she was [facility] psychiatrically [facility] December 2019. She says she has been psychiatrically hospitalized several times.  Pt is dressed in hospital scrubs, alert and oriented x4. Pt speaks in a clear tone, at moderate volume and normal pace. Motor behavior appears normal. Eye contact is good. Pt's mood is irritable and affect is congruent with mood. Thought process is coherent and relevant. Pt was generally cooperative during assessment.   Diagnosis:  F25.1 Schizoaffective disorder, Depressive type F70 Intellectual disability (intellectual developmental disorder), Mild  Past Medical History:  Past Medical History:  Diagnosis Date  . Anxiety   . Asthma   . Borderline personality disorder (HCC)   . Cerebral palsy (HCC)   . COPD (chronic obstructive pulmonary disease) (HCC)   . Depression   . GERD (gastroesophageal reflux disease)   . Mild cognitive impairment   . Schizoaffective disorder (HCC)   . Wound abscess     Past Surgical History:  Procedure Laterality Date  . CHOLECYSTECTOMY    . INCISION AND DRAINAGE ABSCESS Right 01/02/2015   Procedure: INCISION AND DRAINAGE ABSCESS;  Surgeon: Tiney Rougealph Ely III, MD;  Location: [facility] ORS;  Service: General;  Laterality: Right;  . TONSILLECTOMY      Family History:  Family History  Problem Relation Age of Onset  . Heart attack Father   . Diabetes Mother     Social History:  reports that she has quit smoking. She has never used smokeless tobacco. She reports that she does not drink alcohol or use drugs.  Additional Social History:  Alcohol / Drug Use Pain Medications: See MAR Prescriptions: See MAR Over the Counter: See MAR History of alcohol / drug use?: No history of alcohol / drug abuse Longest period of sobriety (when/how long): NA  CIWA: CIWA-Ar BP: 119/71 Pulse Rate: 69 COWS:    Allergies:  Allergies  Allergen Reactions  . Penicillins Nausea And Vomiting and Other (See Comments)    Has patient had  a PCN reaction causing immediate rash, facial/tongue/throat swelling, SOB or lightheadedness with hypotension: No Has patient had a PCN reaction causing severe rash involving mucus membranes or skin necrosis: No Has patient had a PCN reaction that required hospitalization No Has patient had a PCN reaction occurring within the last 10 years: No If all of the above answers are "NO", then may proceed with Cephalosporin use      Home Medications: (Not in a hospital admission)   OB/GYN Status:  Patient's last menstrual period was 05/28/2018 (lmp unknown).  General Assessment Data Location of Assessment: [facility] ED TTS Assessment: In system Is this a Tele or Face-to-Face Assessment?: Tele Assessment Is this an Initial Assessment or a Re-assessment for this encounter?: Initial Assessment Patient Accompanied by:: N/A Language Other than English: No Living Arrangements: Other (Comment)(Lives with family) What gender do you identify as?: Female Marital status: Single Maiden name: Wartman Pregnancy Status: No Living Arrangements: Parent, Other relatives Can pt return to current living arrangement?: Yes Admission Status: Voluntary Is patient capable of signing voluntary admission?: Yes Referral Source: Self/Family/Friend Insurance type: Medicare     Crisis Care Plan Living Arrangements: Parent, Other relatives Legal Guardian: Other:(Self) Name of Psychiatrist: [facility] Name of Therapist: [facility]  Education Status Is patient currently in school?: No Is the patient employed, unemployed or receiving disability?: Receiving disability income  Risk to self with the past 6 months Suicidal Ideation: Yes-Currently Present Has patient been a risk to self within the past 6 months prior to admission? : Yes Suicidal Intent: Yes-Currently Present Has patient had any suicidal intent within the past 6 months prior to admission? : Yes Is patient at risk for suicide?: Yes Suicidal Plan?: Yes-Currently  Present Has patient had any suicidal plan within the past 6 months prior to admission? : Yes Specify Current Suicidal Plan: Plan to get gun from law enforcement and shoot herself Access to Means: No Specify Access to Suicidal Means: Pt doesn't have access to firearms What has been your use of drugs/alcohol within the last 12 months?: Pt denies Previous Attempts/Gestures: Yes How many times?: 7(Multiple suicide attempts) Other Self Harm Risks: None Triggers for Past Attempts: None known Intentional Self Injurious Behavior: Cutting Comment - Self Injurious Behavior: Scratches, history of burns Family Suicide History: Unknown Recent stressful life event(s): Conflict (Comment)(Conflict with family members) Persecutory voices/beliefs?: No Depression: Yes Depression Symptoms: Despondent, Feeling worthless/self pity, Feeling angry/irritable Substance abuse history and/or treatment for substance abuse?: No Suicide prevention information given to non-admitted patients: Not applicable  Risk to Others within the past 6 months Homicidal Ideation: Yes-Currently Present Does patient have any lifetime risk of violence toward others beyond the six months prior to admission? : No Thoughts of Harm to Others: Yes-Currently Present Comment - Thoughts of Harm to Others: Threatening to kill multiple people Current Homicidal Intent: No Current Homicidal Plan: Yes-Currently Present Describe Current Homicidal Plan: Get gun from law enforcement Access to Homicidal Means: No Describe Access to Homicidal Means: None Identified Victim: Family, neighbors, Patent examiner, EMS, hospital staff History of harm to others?: No Assessment of  Violence: None Noted Violent Behavior Description: Pt has no known history of violence Does patient have access to weapons?: No Criminal Charges Pending?: No Does patient have a court date: No Is patient on probation?: No  Psychosis Hallucinations: Auditory, Visual(Hears  voices telling her to kill herself, sees flies) Delusions: None noted  Mental Status Report Appearance/Hygiene: In scrubs, Unremarkable Eye Contact: Good Motor Activity: Unremarkable Speech: Logical/coherent Level of Consciousness: Alert Mood: Preoccupied, Irritable Affect: Irritable Anxiety Level: Moderate Thought Processes: Coherent Judgement: Impaired Orientation: Person, Place, Time, Situation Obsessive Compulsive Thoughts/Behaviors: Minimal  Cognitive Functioning Concentration: Normal Memory: Recent Intact, Remote Intact Is patient IDD: Yes Level of Function: Mild Is IQ score available?: No Insight: Poor Impulse Control: Poor Appetite: Fair Have you had any weight changes? : No Change Sleep: Decreased Total Hours of Sleep: 4 Vegetative Symptoms: None  ADLScreening [facility] Assessment Services) Patient's cognitive ability adequate to safely complete daily activities?: Yes Patient able to express need for assistance with ADLs?: Yes Independently performs ADLs?: Yes (appropriate for developmental age)  Prior Inpatient Therapy Prior Inpatient Therapy: Yes Prior Therapy Dates: Multiple admits Prior Therapy Facilty/Provider(s): [facility], multiple Reason for Treatment: Per chart, "IDD and Depression."   Prior Outpatient Therapy Prior Outpatient Therapy: Yes Prior Therapy Dates: Ongoing Prior Therapy Facilty/Provider(s): [facility] Reason for Treatment: Medication management and counseling.  Does patient have an ACCT team?: No Does patient have Intensive In-House Services?  : No Does patient have [facility] services? : No Does patient have P4CC services?: No  ADL Screening (condition at time of admission) Patient's cognitive ability adequate to safely complete daily activities?: Yes Is the patient deaf or have difficulty hearing?: No Does the patient have difficulty seeing, even when wearing glasses/contacts?: No Does the patient have difficulty concentrating,  remembering, or making decisions?: No Patient able to express need for assistance with ADLs?: Yes Does the patient have difficulty dressing or bathing?: No Independently performs ADLs?: Yes (appropriate for developmental age) Does the patient have difficulty walking or climbing stairs?: No Weakness of Legs: None Weakness of Arms/Hands: None  Home Assistive Devices/Equipment Home Assistive Devices/Equipment: None    Abuse/Neglect Assessment (Assessment to be complete while patient is alone) Abuse/Neglect Assessment Can Be Completed: Yes Physical Abuse: Denies Verbal Abuse: Denies Sexual Abuse: Denies Exploitation of patient/patient's resources: Denies Self-Neglect: Denies     Merchant navy officer (For Healthcare) Does Patient Have a Medical Advance Directive?: No Would patient like information on creating a medical advance directive?: No - Patient declined          Disposition: Gave clinical report to Nira Conn, FNP who recommended Pt be observed and evaluated by psychiatry later this morning. Notified Mia McDonald, PA-C and Doylene Bode, RN of recommendation.  Disposition Initial Assessment Completed for this Encounter: Yes Patient referred to: Other (Comment)  This service was provided via telemedicine using a 2-way, interactive audio and video technology.  Names of all persons participating in this telemedicine service and their role in this encounter. Name: [name] Role: Patient  Name: Shela Commons, [facility] Role: TTS counselor         Harlin Rain Patsy [place], [facility], [facility], [facility] Triage Specialist [phone]  Pamalee Leyden 06/22/2018 4:39 AM

## 2018-06-22 NOTE — ED Notes (Signed)
Breakfast Tray Ordered. 

## 2018-06-22 NOTE — Progress Notes (Signed)
Patient is seen by me via tele-psych and I consulted with Dr. Lucianne Muss.  Patient has returned again to the ED yesterday evening by 648 after being picked up from [facility], ED by her CST team.  Patient continues to state that she is homicidal and is threatening to shoot everyone and she states "I am violent as hell."  She continues begging and asking to be admitted to the hospital, however she will not clarify exactly what she feels she needs help with and continue saying that she just needs to be admitted to the hospital.  Patient has had frequent ED visits with the same report and her CST team has been involved.  I contacted her CST team today and notified them that she had returned to the emergency room.  They stated that they have dropped her off at home around 5 PM yesterday and they were unaware that she had returned to the hospital by 6:48 PM yesterday.  They state that they are trying to work on a plan and trying to keep her from coming to the emergency department and that actually had a contract with her yesterday to prevent this.  Patient did not contact the crisis line to notify them of her needing any assistance.  CST team member Ms. Hill states that the patient is wanting to be admitted to the [facility] and that is possibly why she was stating that she is violent as she knows that you have to be a violent individual to be admitted to [facility].  Patient still has no access to weapons and there are no weapons in her home.  CST team member stated that she would contact her supervisor to come up with a better safety plan since patient does not need to be hospitalized.  I contacted Dr. Erma Heritage and notified him of the recommendations and he states that he will have the patient ready for the CST team once they have established a good safety plan for the patient to be discharged.  At this time patient does not meet inpatient criteria and should be picked up by the CST team and will be awaiting their phone call for plan.

## 2018-06-22 NOTE — ED Notes (Signed)
TTS machine placed at bedside.   

## 2018-06-22 NOTE — Progress Notes (Signed)
CST team was contacted and they were requesting a way to try to prevent the patient from returning to the ED as much as she has been ill recently.  Provided the CST team with [facility] phone number of [phone] so they can speak to them about setting up a plan to prevent as many ED visits.  CST team also stated they would be contacting patient's family to pick her up from the hospital and stated that she is safe to discharge with the family.

## 2018-06-22 NOTE — ED Notes (Signed)
Ordered diet tray for pt  

## 2018-06-22 NOTE — ED Provider Notes (Signed)
Discussed w/ Behavioral NP. Has CST team, which they have contacted. Pt has h/o chronic SI, HI, reporting she is "violent as hell." Per CST, she is wanting to be admitted to [facility] so she can stay out of house (per NP).  Per [facility], CST will no longer pick her up from Hospital, will need to discuss with supervisor. Concern for secondary gain, requesting admission.  Per [facility], pt cleared psychiatrically but will need CST team to pick her up. Awaiting call back from CST.   Shaune Pollack, MD 06/22/18 [phone]

## 2018-06-22 NOTE — ED Notes (Signed)
Patient verbalizes understanding of discharge instructions. Opportunity for questioning and answers were provided. Armband removed by staff, pt discharged from ED.  

## 2018-06-22 NOTE — ED Notes (Signed)
Was on the phone with pts family. Family is at front to pick pt up. Pt given back all belongings.

## 2018-06-22 NOTE — ED Provider Notes (Signed)
[facility] EMERGENCY DEPARTMENT Provider Note   CSN: [id number] Arrival date & time: 06/21/18  1847    History   Chief Complaint Chief Complaint  Patient presents with  . Homicidal    HPI [name] is a 53 y.o. female with a history of CP, COPD, borderline personality disorder, schizoaffective disorder, anxiety, and asthma who presents to the emergency department with a chief complaint of "I'm violent as hell."   The patient states "I am going to kill everyone.  I am going to kill the fireman, policeman, my family, my neighbors, all the workers at the hospital, and I am going to kill you to.  You need to be hospitalized.  I am not well."  She reports that she does not have access to a gun in her home.  She states she has been feeling like this for the last few weeks.  She also endorses worsening suicidal ideation.  She states that her plan is to kill herself.  She denies auditory visual hallucinations.  She denies fever, chills, chest pain, shortness of breath, nausea, vomiting, diarrhea, or abdominal pain.     The history is provided by the patient. No language interpreter was used.    Past Medical History:  Diagnosis Date  . Anxiety   . Asthma   . Borderline personality disorder (HCC)   . Cerebral palsy (HCC)   . COPD (chronic obstructive pulmonary disease) (HCC)   . Depression   . GERD (gastroesophageal reflux disease)   . Mild cognitive impairment   . Schizoaffective disorder (HCC)   . Wound abscess     Patient Active Problem List   Diagnosis Date Noted  . 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 Overdose by acetaminophen 08/08/2016  . Schizoaffective disorder, bipolar type (HCC)   . 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 GERD (gastroesophageal reflux disease) 09/24/2014  . COPD (chronic obstructive pulmonary disease) (HCC) 09/24/2014  . Borderline personality disorder (HCC) 09/24/2014  .  Cerebral palsy (HCC) 08/31/2014    Past Surgical History:  Procedure Laterality Date  . CHOLECYSTECTOMY    . INCISION AND DRAINAGE ABSCESS Right 01/02/2015   Procedure: INCISION AND DRAINAGE ABSCESS;  Surgeon: Tiney Rouge III, MD;  Location: [facility] ORS;  Service: General;  Laterality: Right;  . TONSILLECTOMY       OB History    Gravida  0   Para  0   Term  0   Preterm  0   AB  0   Living  0     SAB  0   TAB  0   Ectopic  0   Multiple  0   Live Births               Home Medications    Prior to Admission medications   Medication Sig Start Date End Date Taking? Authorizing Provider  albuterol (PROVENTIL HFA;VENTOLIN HFA) 108 (90 Base) MCG/ACT inhaler Inhale 2 puffs into the lungs every 4 (four) hours as needed for wheezing or shortness of breath. Patient not taking: Reported on 06/21/2018 06/17/18   Charm Rings, NP  aspirin EC 81 MG tablet Take 1 tablet (81 mg total) by mouth daily. 12/14/17   Clapacs, Jackquline Denmark, MD  clonazePAM (KLONOPIN) 0.5 MG tablet Take 1 tablet (0.5 mg total) by mouth 2 (two) times daily. 12/14/17   Clapacs, Jackquline Denmark, MD  divalproex (DEPAKOTE) 500 MG DR tablet Take 1 tablet (500 mg total) by  mouth 2 (two) times daily. 12/14/17   Clapacs, Jackquline DenmarkJohn T, MD  escitalopram (LEXAPRO) 20 MG tablet Take 1 tablet (20 mg total) by mouth daily. 12/14/17   Clapacs, Jackquline DenmarkJohn T, MD  famotidine (PEPCID) 20 MG tablet Take 1 tablet (20 mg total) by mouth 2 (two) times daily. 06/11/18   Khatri, Hina, PA-C  fenofibrate 160 MG tablet Take 1 tablet (160 mg total) by mouth daily. 12/14/17   Clapacs, Jackquline DenmarkJohn T, MD  hydrOXYzine (ATARAX/VISTARIL) 25 MG tablet Take 1 tablet (25 mg total) by mouth every 6 (six) hours as needed for anxiety. Patient taking differently: Take 25 mg by mouth 2 (two) times daily.  05/11/18   Fayrene Helperran, [place], PA-C  levothyroxine (SYNTHROID, LEVOTHROID) 50 MCG tablet Take 1 tablet (50 mcg total) by mouth daily before breakfast. 12/14/17   Clapacs, Jackquline DenmarkJohn T, MD  oxybutynin  (DITROPAN-XL) 5 MG 24 hr tablet Take 1 tablet (5 mg total) by mouth daily. 12/14/17   Clapacs, Jackquline DenmarkJohn T, MD  pantoprazole (PROTONIX) 40 MG tablet Take 1 tablet (40 mg total) by mouth daily. 12/14/17   Clapacs, Jackquline DenmarkJohn T, MD  risperiDONE (RISPERDAL) 3 MG tablet Take 1 tablet (3 mg total) by mouth 2 (two) times daily. Patient taking differently: Take 3 mg by mouth daily.  12/14/17   Clapacs, Jackquline DenmarkJohn T, MD  sucralfate (CARAFATE) 1 g tablet Take 1 tablet (1 g total) by mouth 4 (four) times daily -  with meals and at bedtime. Patient taking differently: Take 1 g by mouth daily.  12/14/17   Clapacs, Jackquline DenmarkJohn T, MD    Family History Family History  Problem Relation Age of Onset  . Heart attack Father   . Diabetes Mother     Social History Social History   Tobacco Use  . Smoking status: Former Games developermoker  . Smokeless tobacco: Never Used  Substance Use Topics  . Alcohol use: No  . Drug use: No     Allergies   Penicillins   Review of Systems Review of Systems  Constitutional: Negative for activity change, chills and fever.  Respiratory: Negative for shortness of breath.   Cardiovascular: Negative for chest pain, palpitations and leg swelling.  Gastrointestinal: Negative for abdominal pain, diarrhea, nausea and vomiting.  Genitourinary: Negative for dysuria.  Musculoskeletal: Negative for back pain.  Skin: Negative for rash.  Allergic/Immunologic: Negative for immunocompromised state.  Neurological: Negative for headaches.  Psychiatric/Behavioral: Positive for suicidal ideas. Negative for confusion, dysphoric mood, hallucinations and self-injury.       Homicidal ideas     Physical Exam Updated Vital Signs BP 119/71 (BP Location: Right Arm)   Pulse 69   Temp 97.6 F (36.4 C) (Oral)   Resp 16   LMP 05/28/2018 (LMP Unknown)   SpO2 95%   Physical Exam Vitals signs and nursing note reviewed.  Constitutional:      General: She is not in acute distress. HENT:     Head: Normocephalic.  Eyes:       Conjunctiva/sclera: Conjunctivae normal.  Neck:     Musculoskeletal: Neck supple.  Cardiovascular:     Rate and Rhythm: Normal rate and regular rhythm.     Heart sounds: No murmur. No friction rub. No gallop.   Pulmonary:     Effort: Pulmonary effort is normal. No respiratory distress.     Breath sounds: No stridor. No wheezing, rhonchi or rales.  Chest:     Chest wall: No tenderness.  Abdominal:     General: There is no distension.  Palpations: Abdomen is soft. There is no mass.     Tenderness: There is no abdominal tenderness. There is no right CVA tenderness, left CVA tenderness, guarding or rebound.     Hernia: No hernia is present.  Skin:    General: Skin is warm.     Findings: No rash.  Neurological:     Mental Status: She is alert.  Psychiatric:        Mood and Affect: Mood normal.        Behavior: Behavior normal.     Comments: Patient pleasantly states that she is homicidal and is going to kill everyone.  She answers all questions appropriately.  She is cooperative.  She also states that she is suicidal.      ED Treatments / Results  Labs (all labs ordered are listed, but only abnormal results are displayed) Labs Reviewed  ACETAMINOPHEN LEVEL - Abnormal; Notable for the following components:      Result Value   Acetaminophen (Tylenol), Serum <10 (*)    All other components within normal limits  CBC - Abnormal; Notable for the following components:   MCV 100.3 (*)    All other components within normal limits  COMPREHENSIVE METABOLIC PANEL  ETHANOL  SALICYLATE LEVEL  RAPID URINE DRUG SCREEN, HOSP PERFORMED  I-STAT BETA HCG BLOOD, ED ([facility], [facility], [facility] ONLY)    EKG None  Radiology No results found.  Procedures Procedures (including critical care time)  Medications Ordered in ED Medications - No data to display   Initial Impression / Assessment and Plan / ED Course  I have reviewed the triage vital signs and the nursing notes.  Pertinent labs &  imaging results that were available during my care of the patient were reviewed by me and considered in my medical decision making (see chart for details).        53 year old female with a history of CP, COPD, borderline personality disorder, schizoaffective disorder, anxiety, and asthma who is well-known to this emergency department with 23 visits in the last 6 months.  Discharged at 1630 yesterday afternoon and returned at 1847 after she had been discharged to her group home.  Labs were ordered by triage and are reassuring.  TTS was consulted and recommends a.m. psychiatry evaluation to plan coordination and care with the patient's act team given frequent ER use, particularly over the last week.  At this time, she remains voluntary.  See psychiatry notes for further disposition and medical decision making.  She is hemodynamically stable at the time of medical clearance.  She is in no acute distress.  On reevaluation, the patient is pleasantly chatting with the sitter while awaiting a.m. psychiatry consult.  Final Clinical Impressions(s) / ED Diagnoses   Final diagnoses:  Schizoaffective disorder, depressive type [facility])    ED Discharge Orders    None       Scharlene Catalina A, PA-C 06/22/18 0746    Alvira Monday, MD 06/22/18 2323

## 2018-06-22 NOTE — Telephone Encounter (Signed)
Post ED Visit - Positive Culture Follow-up  Culture report reviewed by antimicrobial stewardship pharmacist: [facility] []  [address], Pharm.D. []  Celedonio Miyamoto, Pharm.D., BCPS AQ-ID [x]  Garvin Fila, Pharm.D., BCPS []  Georgina Pillion, [address].D., BCPS []  [place], [address].D., BCPS, AAHIVP []  Estella Husk, Pharm.D., BCPS, AAHIVP []  Lysle Pearl, PharmD, BCPS []  Phillips Climes, PharmD, BCPS []  Agapito Games, PharmD, BCPS []  Verlan Friends, PharmD []  Mervyn Gay, PharmD, BCPS []  Vinnie Level, PharmD  [facility] []  Len Childs, PharmD []  Greer Pickerel, PharmD []  Adalberto Cole, PharmD []  Perlie Gold, Rph []  Lonell Face) Jean Rosenthal, PharmD []  Earl Many, PharmD []  Junita Push, PharmD []  Dorna Leitz, PharmD []  Terrilee Files, PharmD []  Lynann Beaver, PharmD []  Keturah Barre, PharmD []  Loralee Pacas, PharmD []  Bernadene Person, PharmD   Positive urine* culture No further patient follow-up is required at this time.  Carollee Herter Rolinda Impson 06/22/2018, 2:10 PM

## 2018-06-23 ENCOUNTER — Encounter (HOSPITAL_COMMUNITY): Payer: Self-pay

## 2018-06-23 ENCOUNTER — Emergency Department (HOSPITAL_COMMUNITY)
Admission: EM | Admit: 2018-06-23 | Discharge: 2018-06-23 | Disposition: A | Discharge status: Home / Self Care | Payer: Medicare Other | Source: Home / Self Care | Attending: Emergency Medicine | Admitting: Emergency Medicine

## 2018-06-23 ENCOUNTER — Emergency Department (HOSPITAL_COMMUNITY)
Admission: EM | Admit: 2018-06-23 | Discharge: 2018-06-23 | Disposition: A | Discharge status: Home / Self Care | Payer: Medicare Other | Attending: Emergency Medicine | Admitting: Emergency Medicine

## 2018-06-23 ENCOUNTER — Other Ambulatory Visit: Payer: Self-pay

## 2018-06-23 ENCOUNTER — Encounter (HOSPITAL_COMMUNITY): Payer: Self-pay | Admitting: Emergency Medicine

## 2018-06-23 DIAGNOSIS — R45851 Suicidal ideations: Secondary | ICD-10-CM

## 2018-06-23 DIAGNOSIS — F259 Schizoaffective disorder, unspecified: Secondary | ICD-10-CM | POA: Diagnosis not present

## 2018-06-23 DIAGNOSIS — R45850 Homicidal ideations: Secondary | ICD-10-CM

## 2018-06-23 DIAGNOSIS — R197 Diarrhea, unspecified: Secondary | ICD-10-CM

## 2018-06-23 DIAGNOSIS — Z79899 Other long term (current) drug therapy: Secondary | ICD-10-CM | POA: Insufficient documentation

## 2018-06-23 DIAGNOSIS — J449 Chronic obstructive pulmonary disease, unspecified: Secondary | ICD-10-CM | POA: Insufficient documentation

## 2018-06-23 DIAGNOSIS — F99 Mental disorder, not otherwise specified: Secondary | ICD-10-CM

## 2018-06-23 DIAGNOSIS — Z87891 Personal history of nicotine dependence: Secondary | ICD-10-CM | POA: Insufficient documentation

## 2018-06-23 DIAGNOSIS — Z659 Problem related to unspecified psychosocial circumstances: Secondary | ICD-10-CM

## 2018-06-23 DIAGNOSIS — Z608 Other problems related to social environment: Secondary | ICD-10-CM | POA: Insufficient documentation

## 2018-06-23 DIAGNOSIS — F329 Major depressive disorder, single episode, unspecified: Secondary | ICD-10-CM | POA: Insufficient documentation

## 2018-06-23 DIAGNOSIS — F603 Borderline personality disorder: Secondary | ICD-10-CM | POA: Insufficient documentation

## 2018-06-23 DIAGNOSIS — Z7982 Long term (current) use of aspirin: Secondary | ICD-10-CM | POA: Insufficient documentation

## 2018-06-23 NOTE — Discharge Instructions (Addendum)
Follow-up with your primary care doctor, your therapist, and your psychiatrist for further care as planned, and as needed.

## 2018-06-23 NOTE — ED Notes (Signed)
Patient verbalized permission for this RN to sign for discharge. Patient left with mother.

## 2018-06-23 NOTE — ED Provider Notes (Signed)
[facility]-EMERGENCY DEPT Provider Note   CSN: [id number] Arrival date & time: 06/23/18  1511    History   Chief Complaint Chief Complaint  Patient presents with  . Diarrhea  . Nausea  . Abdominal Pain    HPI [name] is a 53 y.o. female.     HPI Patient reports he is coming here for diarrhea.  She reports that she has had diarrhea for quite a long time.  She cannot remember exactly when it started.  She reports that she has a loose bowel movement "every time she eats".  No nausea, no vomiting.  Lower abdominal cramping sometimes.  That has been present for a long time.  Patient denies any fever.  Denies generally weakness.  Denies pain burning with urination.  Patient reports "when we get done here, can you just get me over to behavioral health?".  Patient reports, "I am suicidal."  Patient does not have a plan and no immediate self-harm.  She reports she just wants to get moved over to the behavioral health hospital. Past Medical History:  Diagnosis Date  . Anxiety   . Asthma   . Borderline personality disorder (HCC)   . Cerebral palsy (HCC)   . COPD (chronic obstructive pulmonary disease) (HCC)   . Depression   . GERD (gastroesophageal reflux disease)   . Mild cognitive impairment   . Schizoaffective disorder (HCC)   . Wound abscess     Patient Active Problem List   Diagnosis Date Noted  . 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 Overdose by acetaminophen 08/08/2016  . Schizoaffective disorder, bipolar type (HCC)   . 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 GERD (gastroesophageal reflux disease) 09/24/2014  . COPD (chronic obstructive pulmonary disease) (HCC) 09/24/2014  . Borderline personality disorder (HCC) 09/24/2014  . Cerebral palsy (HCC) 08/31/2014    Past Surgical History:  Procedure Laterality Date  . CHOLECYSTECTOMY    . INCISION AND DRAINAGE ABSCESS Right 01/02/2015   Procedure:  INCISION AND DRAINAGE ABSCESS;  Surgeon: Tiney Rouge III, MD;  Location: [facility] ORS;  Service: General;  Laterality: Right;  . TONSILLECTOMY       OB History    Gravida  0   Para  0   Term  0   Preterm  0   AB  0   Living  0     SAB  0   TAB  0   Ectopic  0   Multiple  0   Live Births               Home Medications    Prior to Admission medications   Medication Sig Start Date End Date Taking? Authorizing Provider  albuterol (PROVENTIL HFA;VENTOLIN HFA) 108 (90 Base) MCG/ACT inhaler Inhale 2 puffs into the lungs every 4 (four) hours as needed for wheezing or shortness of breath. Patient not taking: Reported on 06/21/2018 06/17/18   Charm Rings, NP  aspirin EC 81 MG tablet Take 1 tablet (81 mg total) by mouth daily. 12/14/17   Clapacs, Jackquline Denmark, MD  clonazePAM (KLONOPIN) 0.5 MG tablet Take 1 tablet (0.5 mg total) by mouth 2 (two) times daily. 12/14/17   Clapacs, Jackquline Denmark, MD  divalproex (DEPAKOTE) 500 MG DR tablet Take 1 tablet (500 mg total) by mouth 2 (two) times daily. 12/14/17   Clapacs, Jackquline Denmark, MD  escitalopram (LEXAPRO) 20 MG tablet Take 1 tablet (20 mg total) by mouth daily. 12/14/17  Clapacs, Jackquline Denmark, MD  famotidine (PEPCID) 20 MG tablet Take 1 tablet (20 mg total) by mouth 2 (two) times daily. 06/11/18   Khatri, Hina, PA-C  fenofibrate 160 MG tablet Take 1 tablet (160 mg total) by mouth daily. 12/14/17   Clapacs, Jackquline Denmark, MD  hydrOXYzine (ATARAX/VISTARIL) 25 MG tablet Take 1 tablet (25 mg total) by mouth every 6 (six) hours as needed for anxiety. Patient taking differently: Take 25 mg by mouth 2 (two) times daily.  05/11/18   Fayrene Helper, PA-C  levothyroxine (SYNTHROID, LEVOTHROID) 50 MCG tablet Take 1 tablet (50 mcg total) by mouth daily before breakfast. 12/14/17   Clapacs, Jackquline Denmark, MD  oxybutynin (DITROPAN-XL) 5 MG 24 hr tablet Take 1 tablet (5 mg total) by mouth daily. 12/14/17   Clapacs, Jackquline Denmark, MD  pantoprazole (PROTONIX) 40 MG tablet Take 1 tablet (40 mg total) by mouth  daily. 12/14/17   Clapacs, Jackquline Denmark, MD  risperiDONE (RISPERDAL) 3 MG tablet Take 1 tablet (3 mg total) by mouth 2 (two) times daily. Patient taking differently: Take 3 mg by mouth daily.  12/14/17   Clapacs, Jackquline Denmark, MD  sucralfate (CARAFATE) 1 g tablet Take 1 tablet (1 g total) by mouth 4 (four) times daily -  with meals and at bedtime. Patient taking differently: Take 1 g by mouth daily.  12/14/17   Clapacs, Jackquline Denmark, MD    Family History Family History  Problem Relation Age of Onset  . Heart attack Father   . Diabetes Mother     Social History Social History   Tobacco Use  . Smoking status: Former Games developer  . Smokeless tobacco: Never Used  Substance Use Topics  . Alcohol use: No  . Drug use: No     Allergies   Penicillins   Review of Systems Review of Systems 10 Systems reviewed and are negative for acute change except as noted in the HPI.   Physical Exam Updated Vital Signs BP 104/72 (BP Location: Right Arm)   Pulse 80   Temp 98.4 F (36.9 C) (Oral)   Resp 16   LMP 05/28/2018 (LMP Unknown)   SpO2 100%   Physical Exam Constitutional:      Comments: Patient is alert and nontoxic.  She sitting at the edge of stretcher clinically well in appearance.  No distress.  HENT:     Head: Normocephalic and atraumatic.     Mouth/Throat:     Mouth: Mucous membranes are moist.     Pharynx: Oropharynx is clear.  Eyes:     Extraocular Movements: Extraocular movements intact.  Cardiovascular:     Rate and Rhythm: Normal rate and regular rhythm.  Pulmonary:     Effort: Pulmonary effort is normal.     Breath sounds: Normal breath sounds.  Abdominal:     General: There is no distension.     Palpations: Abdomen is soft.     Tenderness: There is no abdominal tenderness. There is no guarding.  Musculoskeletal: Normal range of motion.        General: No swelling or tenderness.     Right lower leg: No edema.     Left lower leg: No edema.     Comments: Patient is ambulatory about  the emergency department without difficulty.  Skin:    General: Skin is warm and dry.  Neurological:     General: No focal deficit present.     Coordination: Coordination normal.  Psychiatric:  Mood and Affect: Mood normal.      ED Treatments / Results  Labs (all labs ordered are listed, but only abnormal results are displayed) Labs Reviewed - No data to display  EKG None  Radiology No results found.  Procedures Procedures (including critical care time)  Medications Ordered in ED Medications - No data to display   Initial Impression / Assessment and Plan / ED Course  I have reviewed the triage vital signs and the nursing notes.  Pertinent labs & imaging results that were available during my care of the patient were reviewed by me and considered in my medical decision making (see chart for details).       Patient shows no signs of dehydration.  She cites bowel movements after she eats.  She does not have fever or vomiting.  At this time recommendations for food choices with diarrhea are provided.  Patient has had lab work within the past 2 days.  Do not suspect she has developed any significant interim electrolyte imbalance or clinical dehydration.  Patient wants to be in the psychiatric unit.  This appears to be her main goal for presenting to the hospital today.  She has no plan for hurting herself.  Her mood is good.  She is stable to follow-up on outpatient basis with psychiatry.  Patient was just assessed and discharged yesterday by psychiatry.  Final Clinical Impressions(s) / ED Diagnoses   Final diagnoses:  Diarrhea, unspecified type  Psychiatric problem  Social problem    ED Discharge Orders    None       Arby Barrette, MD 06/23/18 [phone]

## 2018-06-23 NOTE — ED Triage Notes (Signed)
Per [facility] pt from home for abd pains with n/d for months.

## 2018-06-23 NOTE — ED Triage Notes (Addendum)
Per [facility] and hom[place]. She told [facility] that "if I had a gun, I would shoot your ass." Patient was seen earlier but left AMA.   Patient states that she went to [facility] earlier but was told that they didn't have any beds open.

## 2018-06-23 NOTE — ED Notes (Signed)
Bed: KP54 Expected date:  Expected time:  Means of arrival:  Comments: 53 yo F Depression

## 2018-06-23 NOTE — ED Notes (Signed)
Bed: [facility] Expected date:  Expected time:  Means of arrival:  Comments: 

## 2018-06-23 NOTE — ED Provider Notes (Signed)
[facility]-EMERGENCY DEPT Provider Note   CSN: [id number] Arrival date & time: 06/23/18  2011    History   Chief Complaint Chief Complaint  Patient presents with  . Suicidal  . Homicidal    HPI [name] is a 53 y.o. female.     HPI   Patient presents by EMS for evaluation of feeling homicidal and suicidal.  She was here and discharged a couple of hours ago, [place] and [place] called ambulance to bring her back here for the same complaint.  This been a recurrent problem for several weeks with multiple visits.  Patient states that if she had a gun she would show some people but she does not have a gun.  She has thought about killing herself with pills.  She states that she lives with several family members.  She states that she wants to go to a behavioral health Hospital, now.  There are no other known modifying factors.  Past Medical History:  Diagnosis Date  . Anxiety   . Asthma   . Borderline personality disorder (HCC)   . Cerebral palsy (HCC)   . COPD (chronic obstructive pulmonary disease) (HCC)   . Depression   . GERD (gastroesophageal reflux disease)   . Mild cognitive impairment   . Schizoaffective disorder (HCC)   . Wound abscess     Patient Active Problem List   Diagnosis Date Noted  . 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 Overdose by acetaminophen 08/08/2016  . Schizoaffective disorder, bipolar type (HCC)   . 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 GERD (gastroesophageal reflux disease) 09/24/2014  . COPD (chronic obstructive pulmonary disease) (HCC) 09/24/2014  . Borderline personality disorder (HCC) 09/24/2014  . Cerebral palsy (HCC) 08/31/2014    Past Surgical History:  Procedure Laterality Date  . CHOLECYSTECTOMY    . INCISION AND DRAINAGE ABSCESS Right 01/02/2015   Procedure: INCISION AND DRAINAGE ABSCESS;  Surgeon: Tiney Rouge III, MD;  Location: [facility] ORS;   Service: General;  Laterality: Right;  . TONSILLECTOMY       OB History    Gravida  0   Para  0   Term  0   Preterm  0   AB  0   Living  0     SAB  0   TAB  0   Ectopic  0   Multiple  0   Live Births               Home Medications    Prior to Admission medications   Medication Sig Start Date End Date Taking? Authorizing Provider  albuterol (PROVENTIL HFA;VENTOLIN HFA) 108 (90 Base) MCG/ACT inhaler Inhale 2 puffs into the lungs every 4 (four) hours as needed for wheezing or shortness of breath. Patient not taking: Reported on 06/21/2018 06/17/18   Charm Rings, NP  aspirin EC 81 MG tablet Take 1 tablet (81 mg total) by mouth daily. 12/14/17   Clapacs, Jackquline Denmark, MD  clonazePAM (KLONOPIN) 0.5 MG tablet Take 1 tablet (0.5 mg total) by mouth 2 (two) times daily. 12/14/17   Clapacs, Jackquline Denmark, MD  divalproex (DEPAKOTE) 500 MG DR tablet Take 1 tablet (500 mg total) by mouth 2 (two) times daily. 12/14/17   Clapacs, Jackquline Denmark, MD  escitalopram (LEXAPRO) 20 MG tablet Take 1 tablet (20 mg total) by mouth daily. 12/14/17   Clapacs, Jackquline Denmark, MD  famotidine (PEPCID) 20 MG tablet Take  1 tablet (20 mg total) by mouth 2 (two) times daily. 06/11/18   Khatri, Hina, PA-C  fenofibrate 160 MG tablet Take 1 tablet (160 mg total) by mouth daily. 12/14/17   Clapacs, Jackquline Denmark, MD  hydrOXYzine (ATARAX/VISTARIL) 25 MG tablet Take 1 tablet (25 mg total) by mouth every 6 (six) hours as needed for anxiety. Patient taking differently: Take 25 mg by mouth 2 (two) times daily.  05/11/18   Fayrene Helper, PA-C  levothyroxine (SYNTHROID, LEVOTHROID) 50 MCG tablet Take 1 tablet (50 mcg total) by mouth daily before breakfast. 12/14/17   Clapacs, Jackquline Denmark, MD  oxybutynin (DITROPAN-XL) 5 MG 24 hr tablet Take 1 tablet (5 mg total) by mouth daily. 12/14/17   Clapacs, Jackquline Denmark, MD  pantoprazole (PROTONIX) 40 MG tablet Take 1 tablet (40 mg total) by mouth daily. 12/14/17   Clapacs, Jackquline Denmark, MD  risperiDONE (RISPERDAL) 3 MG tablet Take 1  tablet (3 mg total) by mouth 2 (two) times daily. Patient taking differently: Take 3 mg by mouth daily.  12/14/17   Clapacs, Jackquline Denmark, MD  sucralfate (CARAFATE) 1 g tablet Take 1 tablet (1 g total) by mouth 4 (four) times daily -  with meals and at bedtime. Patient taking differently: Take 1 g by mouth daily.  12/14/17   Clapacs, Jackquline Denmark, MD    Family History Family History  Problem Relation Age of Onset  . Heart attack Father   . Diabetes Mother     Social History Social History   Tobacco Use  . Smoking status: Former Games developer  . Smokeless tobacco: Never Used  Substance Use Topics  . Alcohol use: No  . Drug use: No     Allergies   Penicillins   Review of Systems Review of Systems  All other systems reviewed and are negative.    Physical Exam Updated Vital Signs BP 136/84 (BP Location: Right Arm)   Pulse 80   Temp 97.7 F (36.5 C) (Oral)   Resp 16   LMP 05/28/2018 (LMP Unknown)   SpO2 98%   Physical Exam Vitals signs and nursing note reviewed.  Constitutional:      General: She is not in acute distress.    Appearance: She is well-developed. She is obese. She is not ill-appearing, toxic-appearing or diaphoretic.  HENT:     Head: Normocephalic and atraumatic.     Right Ear: External ear normal.     Left Ear: External ear normal.     Nose: Nose normal.     Mouth/Throat:     Mouth: Mucous membranes are moist.  Eyes:     Conjunctiva/sclera: Conjunctivae normal.     Pupils: Pupils are equal, round, and reactive to light.  Neck:     Musculoskeletal: Normal range of motion and neck supple.     Trachea: Phonation normal.  Cardiovascular:     Rate and Rhythm: Normal rate.  Pulmonary:     Effort: Pulmonary effort is normal.  Musculoskeletal: Normal range of motion.  Skin:    General: Skin is warm and dry.     Coloration: Skin is not jaundiced.  Neurological:     Mental Status: She is alert and oriented to person, place, and time.     Cranial Nerves: No cranial  nerve deficit.     Sensory: No sensory deficit.     Motor: No abnormal muscle tone.     Coordination: Coordination normal.  Psychiatric:     Comments: Anxious  ED Treatments / Results  Labs (all labs ordered are listed, but only abnormal results are displayed) Labs Reviewed - No data to display  EKG None  Radiology No results found.  Procedures Procedures (including critical care time)  Medications Ordered in ED Medications - No data to display   Initial Impression / Assessment and Plan / ED Course  I have reviewed the triage vital signs and the nursing notes.  Pertinent labs & imaging results that were available during my care of the patient were reviewed by me and considered in my medical decision making (see chart for details).  Clinical Course as of Jun 22 2104  Wynelle Link Jun 23, 2018  2103 The case was discussed with Ms. Sueanne Margarita, CST team (phone # [phone]).  She arranged to have a family member come and get the patient.  She will help to arrange follow-up care in the community setting.   [EW]    Clinical Course User Index [EW] Mancel Bale, MD        Patient Vitals for the past 24 hrs:  BP Temp Temp src Pulse Resp SpO2  06/23/18 2021 136/84 97.7 F (36.5 C) Oral 80 16 98 %    9:06 PM Reevaluation with update and discussion. After initial assessment and treatment, an updated evaluation reveals no change in clinical status.  Findings discussed with the patient and all questions were answered. Mancel Bale   Medical Decision Making: Recurrent nonspecific symptoms with known psychiatric illness.  Patient has been evaluated comprehensively, recently, and does not appear to be significantly changed from times of prior evaluation.  She is therefore stable for discharge with outpatient management as currently scheduled, with her multiple provider team members.  CRITICAL CARE-no Performed by: Mancel Bale  Nursing Notes Reviewed/ Care  Coordinated Applicable Imaging Reviewed Interpretation of Laboratory Data incorporated into ED treatment  The patient appears reasonably screened and/or stabilized for discharge and I doubt any other medical condition or other [facility] requiring further screening, evaluation, or treatment in the ED at this time prior to discharge.  Plan: Home Medications-continue usual; Home Treatments-rest, fluids; return here if the recommended treatment, does not improve the symptoms; Recommended follow up-follow-up with usual team members.   Final Clinical Impressions(s) / ED Diagnoses   Final diagnoses:  Homicidal ideations    ED Discharge Orders    None       Mancel Bale, MD 06/23/18 2108

## 2018-06-25 ENCOUNTER — Emergency Department (HOSPITAL_COMMUNITY)
Admission: EM | Admit: 2018-06-25 | Discharge: 2018-06-25 | Disposition: A | Discharge status: Home / Self Care | Payer: Medicare Other | Attending: Emergency Medicine | Admitting: Emergency Medicine

## 2018-06-25 ENCOUNTER — Other Ambulatory Visit: Payer: Self-pay

## 2018-06-25 ENCOUNTER — Encounter (HOSPITAL_COMMUNITY): Payer: Self-pay | Admitting: *Deleted

## 2018-06-25 ENCOUNTER — Emergency Department (EMERGENCY_DEPARTMENT_HOSPITAL)
Admission: EM | Admit: 2018-06-25 | Discharge: 2018-06-26 | Disposition: A | Discharge status: Home / Self Care | Payer: Medicare Other | Source: Home / Self Care | Attending: Emergency Medicine | Admitting: Emergency Medicine

## 2018-06-25 ENCOUNTER — Encounter (HOSPITAL_COMMUNITY): Payer: Self-pay | Admitting: Emergency Medicine

## 2018-06-25 DIAGNOSIS — F4325 Adjustment disorder with mixed disturbance of emotions and conduct: Secondary | ICD-10-CM | POA: Diagnosis present

## 2018-06-25 DIAGNOSIS — Z87891 Personal history of nicotine dependence: Secondary | ICD-10-CM | POA: Insufficient documentation

## 2018-06-25 DIAGNOSIS — Z7982 Long term (current) use of aspirin: Secondary | ICD-10-CM | POA: Diagnosis not present

## 2018-06-25 DIAGNOSIS — F70 Mild intellectual disabilities: Secondary | ICD-10-CM | POA: Diagnosis not present

## 2018-06-25 DIAGNOSIS — F250 Schizoaffective disorder, bipolar type: Secondary | ICD-10-CM | POA: Insufficient documentation

## 2018-06-25 DIAGNOSIS — F603 Borderline personality disorder: Secondary | ICD-10-CM | POA: Diagnosis present

## 2018-06-25 DIAGNOSIS — Z046 Encounter for general psychiatric examination, requested by authority: Secondary | ICD-10-CM | POA: Diagnosis present

## 2018-06-25 DIAGNOSIS — G809 Cerebral palsy, unspecified: Secondary | ICD-10-CM | POA: Insufficient documentation

## 2018-06-25 DIAGNOSIS — R45851 Suicidal ideations: Secondary | ICD-10-CM

## 2018-06-25 DIAGNOSIS — F419 Anxiety disorder, unspecified: Secondary | ICD-10-CM | POA: Diagnosis not present

## 2018-06-25 DIAGNOSIS — Z79899 Other long term (current) drug therapy: Secondary | ICD-10-CM | POA: Diagnosis not present

## 2018-06-25 DIAGNOSIS — R45850 Homicidal ideations: Secondary | ICD-10-CM

## 2018-06-25 DIAGNOSIS — J449 Chronic obstructive pulmonary disease, unspecified: Secondary | ICD-10-CM

## 2018-06-25 DIAGNOSIS — J45909 Unspecified asthma, uncomplicated: Secondary | ICD-10-CM | POA: Insufficient documentation

## 2018-06-25 LAB — SALICYLATE LEVEL: Salicylate Lvl: <7 mg/dL (ref 2.8–30.0)

## 2018-06-25 LAB — COMPREHENSIVE METABOLIC PANEL
ALT: 25 U/L (ref 0–44)
AST: 29 U/L (ref 15–41)
Albumin: 3.8 g/dL (ref 3.5–5.0)
Alkaline Phosphatase: 46 U/L (ref 38–126)
Anion gap: 10 (ref 5–15)
BUN: 21 mg/dL — ABNORMAL HIGH (ref 6–20)
CO2: 25 mmol/L (ref 22–32)
Calcium: 9.1 mg/dL (ref 8.9–10.3)
Chloride: 105 mmol/L (ref 98–111)
Creatinine, Ser: 1 mg/dL (ref 0.44–1.00)
GFR calc Af Amer: >60 mL/min (ref >60)
GFR calc non Af Amer: >60 mL/min (ref >60)
Glucose, Bld: 93 mg/dL (ref 70–99)
Potassium: 3.6 mmol/L (ref 3.5–5.1)
Sodium: 140 mmol/L (ref 135–145)
Total Bilirubin: 0.3 mg/dL (ref 0.3–1.2)
Total Protein: 7.6 g/dL (ref 6.5–8.1)

## 2018-06-25 LAB — ETHANOL: Alcohol, Ethyl (B): <10 mg/dL (ref <10)

## 2018-06-25 LAB — CBC
HEMATOCRIT: 38.9 % (ref 36.0–46.0)
Hemoglobin: 12.5 g/dL (ref 12.0–15.0)
MCH: 32.7 pg (ref 26.0–34.0)
MCHC: 32.1 g/dL (ref 30.0–36.0)
MCV: 101.8 fL — AB (ref 80.0–100.0)
Platelets: 330 10*3/uL (ref 150–400)
RBC: 3.82 MIL/uL — ABNORMAL LOW (ref 3.87–5.11)
RDW: 13.4 % (ref 11.5–15.5)
WBC: 8.5 10*3/uL (ref 4.0–10.5)
nRBC: 0 % (ref 0.0–0.2)

## 2018-06-25 LAB — RAPID URINE DRUG SCREEN, HOSP PERFORMED
Amphetamines: NOT DETECTED
Barbiturates: NOT DETECTED
Benzodiazepines: NOT DETECTED
Cocaine: NOT DETECTED
Opiates: NOT DETECTED
Tetrahydrocannabinol: NOT DETECTED

## 2018-06-25 LAB — ACETAMINOPHEN LEVEL: Acetaminophen (Tylenol), Serum: <10 ug/mL — ABNORMAL LOW (ref 10–30)

## 2018-06-25 MED ORDER — CLONAZEPAM 0.5 MG PO TABS
0.5000 mg | ORAL_TABLET | Freq: Two times a day (BID) | ORAL | Status: DC
  Administered 2018-06-25 – 2018-06-26 (×2): 0.5 mg via ORAL
  Filled 2018-06-25 (×2): qty 1

## 2018-06-25 MED ORDER — FENOFIBRATE 160 MG PO TABS
160.0000 mg | ORAL_TABLET | Freq: Every day | ORAL | Status: DC
  Administered 2018-06-26: 160 mg via ORAL
  Filled 2018-06-25: qty 1

## 2018-06-25 MED ORDER — ESCITALOPRAM OXALATE 10 MG PO TABS
20.0000 mg | ORAL_TABLET | Freq: Every day | ORAL | Status: DC
  Administered 2018-06-26: 20 mg via ORAL
  Filled 2018-06-25: qty 2

## 2018-06-25 MED ORDER — DIVALPROEX SODIUM 500 MG PO DR TAB
500.0000 mg | DELAYED_RELEASE_TABLET | Freq: Two times a day (BID) | ORAL | Status: DC
  Administered 2018-06-25 – 2018-06-26 (×3): 500 mg via ORAL
  Filled 2018-06-25 (×2): qty 1

## 2018-06-25 MED ORDER — RISPERIDONE 2 MG PO TABS
3.0000 mg | ORAL_TABLET | Freq: Two times a day (BID) | ORAL | Status: DC
  Administered 2018-06-25 – 2018-06-26 (×2): 3 mg via ORAL
  Filled 2018-06-25 (×2): qty 1

## 2018-06-25 MED ORDER — SUCRALFATE 1 G PO TABS
1.0000 g | ORAL_TABLET | Freq: Three times a day (TID) | ORAL | Status: DC
  Administered 2018-06-25 – 2018-06-26 (×2): 1 g via ORAL
  Filled 2018-06-25 (×5): qty 1

## 2018-06-25 MED ORDER — FAMOTIDINE 20 MG PO TABS
20.0000 mg | ORAL_TABLET | Freq: Two times a day (BID) | ORAL | Status: DC
  Administered 2018-06-25 – 2018-06-26 (×2): 20 mg via ORAL
  Filled 2018-06-25 (×2): qty 1

## 2018-06-25 MED ORDER — LEVOTHYROXINE SODIUM 50 MCG PO TABS
50.0000 ug | ORAL_TABLET | Freq: Every day | ORAL | Status: DC
  Administered 2018-06-26: 50 ug via ORAL
  Filled 2018-06-25 (×2): qty 1

## 2018-06-25 MED ORDER — OXYBUTYNIN CHLORIDE ER 5 MG PO TB24
5.0000 mg | ORAL_TABLET | Freq: Every day | ORAL | Status: DC
  Administered 2018-06-26: 5 mg via ORAL
  Filled 2018-06-25: qty 1

## 2018-06-25 MED ORDER — HYDROXYZINE HCL 25 MG PO TABS
25.0000 mg | ORAL_TABLET | Freq: Four times a day (QID) | ORAL | Status: DC | PRN
  Administered 2018-06-25: 25 mg via ORAL
  Filled 2018-06-25: qty 1

## 2018-06-25 MED ORDER — ASPIRIN EC 81 MG PO TBEC
81.0000 mg | DELAYED_RELEASE_TABLET | Freq: Every day | ORAL | Status: DC
  Administered 2018-06-26: 81 mg via ORAL
  Filled 2018-06-25: qty 1

## 2018-06-25 MED ORDER — PANTOPRAZOLE SODIUM 40 MG PO TBEC
40.0000 mg | DELAYED_RELEASE_TABLET | Freq: Every day | ORAL | Status: DC
  Administered 2018-06-26: 40 mg via ORAL
  Filled 2018-06-25: qty 1

## 2018-06-25 NOTE — ED Notes (Signed)
Bed: [facility] Expected date:  Expected time:  Means of arrival:  Comments: 

## 2018-06-25 NOTE — ED Notes (Signed)
Pt has been seen and wand by security. 

## 2018-06-25 NOTE — ED Notes (Signed)
Bed: WBH43 Expected date:  Expected time:  Means of arrival:  Comments: 

## 2018-06-25 NOTE — ED Triage Notes (Signed)
Arrived via [facility] police department. Patient from home called the police stating she wants to go to [place], [place] with no plan, and [place] stating she will get the officer's gun and shoot everyone. Alert answering and following commands appropriate.

## 2018-06-25 NOTE — ED Triage Notes (Signed)
Pt transported by [employer] from [place].  Pt left [place] to go to [place] petitioned pt with IVC papers.  Pt reports she is suicidal and homicidal towards her sister-in-law but not her mother, and verbalized to the police officers that she will take their guns and shoot them.  Pt made aware that she has been in the ED multiple times for same, pt states "this time I mean it."  She reports she does not want to live with her sister-in-law because she is very mean towards her.  She informed this nurse that "I have legal papers against me."  She wants inpatient treatment and wants to go to [facility].

## 2018-06-25 NOTE — ED Provider Notes (Signed)
[facility]-EMERGENCY DEPT Provider Note   CSN: [id number] Arrival date & time: 06/25/18  1630    History   Chief Complaint Chief Complaint  Patient presents with  . IVC  . Suicidal  . Homicidal    HPI [name] is a 53 y.o. female.   HPI Patient presents to the emergency room for evaluation of recurrent suicidal and homicidal ideation.  Patient has a history of cerebral palsy, borderline personality disorder, schizoaffective disorder and several other medical problems.  Patient has a history of recurrent suicidal and homicidal ideation.  She has been to the 27 times in the last 6 months.  Most of her visits are due to psychiatric complaints.  Patient was actually in the emergency room from March 6 to March 7.  She was discharged.  Patient was then in the emergency room on March 8 and was also in the emergency room today on March 10.  Patient was actually released from the ED.  Patient then went to [facility] who placed her on involuntary commitment and sent her here.  Patient states she is feeling violent.  She wants to kill people.  Patient states she wants to take down from the officer that is here and shoot people. Past Medical History:  Diagnosis Date  . Anxiety   . Asthma   . Borderline personality disorder (HCC)   . Cerebral palsy (HCC)   . COPD (chronic obstructive pulmonary disease) (HCC)   . Depression   . GERD (gastroesophageal reflux disease)   . Mild cognitive impairment   . Schizoaffective disorder (HCC)   . Wound abscess     Patient Active Problem List   Diagnosis Date Noted  . 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 Overdose by acetaminophen 08/08/2016  . Schizoaffective disorder, bipolar type (HCC)   . 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 GERD (gastroesophageal reflux disease) 09/24/2014  . COPD (chronic obstructive pulmonary disease) (HCC) 09/24/2014  . Borderline personality disorder  (HCC) 09/24/2014  . Cerebral palsy (HCC) 08/31/2014    Past Surgical History:  Procedure Laterality Date  . CHOLECYSTECTOMY    . INCISION AND DRAINAGE ABSCESS Right 01/02/2015   Procedure: INCISION AND DRAINAGE ABSCESS;  Surgeon: Tiney Rouge III, MD;  Location: [facility] ORS;  Service: General;  Laterality: Right;  . TONSILLECTOMY       OB History    Gravida  0   Para  0   Term  0   Preterm  0   AB  0   Living  0     SAB  0   TAB  0   Ectopic  0   Multiple  0   Live Births               Home Medications    Prior to Admission medications   Medication Sig Start Date End Date Taking? Authorizing Provider  aspirin EC 81 MG tablet Take 1 tablet (81 mg total) by mouth daily. 12/14/17  Yes Clapacs, Jackquline Denmark, MD  clonazePAM (KLONOPIN) 0.5 MG tablet Take 1 tablet (0.5 mg total) by mouth 2 (two) times daily. 12/14/17  Yes Clapacs, Jackquline Denmark, MD  divalproex (DEPAKOTE) 500 MG DR tablet Take 1 tablet (500 mg total) by mouth 2 (two) times daily. 12/14/17  Yes Clapacs, Jackquline Denmark, MD  escitalopram (LEXAPRO) 20 MG tablet Take 1 tablet (20 mg total) by mouth daily. 12/14/17  Yes Clapacs, Jackquline Denmark, MD  famotidine (PEPCID) 20  MG tablet Take 1 tablet (20 mg total) by mouth 2 (two) times daily. 06/11/18  Yes Khatri, Hina, PA-C  fenofibrate 160 MG tablet Take 1 tablet (160 mg total) by mouth daily. 12/14/17  Yes Clapacs, Jackquline Denmark, MD  hydrOXYzine (ATARAX/VISTARIL) 25 MG tablet Take 1 tablet (25 mg total) by mouth every 6 (six) hours as needed for anxiety. Patient taking differently: Take 25 mg by mouth 2 (two) times daily.  05/11/18  Yes Fayrene Helper, PA-C  oxybutynin (DITROPAN-XL) 5 MG 24 hr tablet Take 1 tablet (5 mg total) by mouth daily. 12/14/17  Yes Clapacs, Jackquline Denmark, MD  pantoprazole (PROTONIX) 40 MG tablet Take 1 tablet (40 mg total) by mouth daily. 12/14/17  Yes Clapacs, Jackquline Denmark, MD  risperiDONE (RISPERDAL) 3 MG tablet Take 1 tablet (3 mg total) by mouth 2 (two) times daily. 12/14/17  Yes Clapacs, Jackquline Denmark, MD   sucralfate (CARAFATE) 1 g tablet Take 1 tablet (1 g total) by mouth 4 (four) times daily -  with meals and at bedtime. Patient taking differently: Take 1 g by mouth daily.  12/14/17  Yes Clapacs, Jackquline Denmark, MD  albuterol (PROVENTIL HFA;VENTOLIN HFA) 108 (90 Base) MCG/ACT inhaler Inhale 2 puffs into the lungs every 4 (four) hours as needed for wheezing or shortness of breath. Patient not taking: Reported on 06/25/2018 06/17/18   Charm Rings, NP  levothyroxine (SYNTHROID, LEVOTHROID) 50 MCG tablet Take 1 tablet (50 mcg total) by mouth daily before breakfast. 12/14/17   Clapacs, Jackquline Denmark, MD    Family History Family History  Problem Relation Age of Onset  . Heart attack Father   . Diabetes Mother     Social History Social History   Tobacco Use  . Smoking status: Former Games developer  . Smokeless tobacco: Never Used  Substance Use Topics  . Alcohol use: No  . Drug use: No     Allergies   Penicillins   Review of Systems Review of Systems  All other systems reviewed and are negative.    Physical Exam Updated Vital Signs BP 132/89 (BP Location: Left Arm)   Pulse 88   Temp 98.4 F (36.9 C) (Oral)   Resp 17   LMP 05/28/2018 (LMP Unknown)   SpO2 97%   Physical Exam Vitals signs and nursing note reviewed.  Constitutional:      General: She is not in acute distress.    Appearance: She is well-developed.  HENT:     Head: Normocephalic and atraumatic.     Right Ear: External ear normal.     Left Ear: External ear normal.  Eyes:     General: No scleral icterus.       Right eye: No discharge.        Left eye: No discharge.     Conjunctiva/sclera: Conjunctivae normal.  Neck:     Musculoskeletal: Neck supple.     Trachea: No tracheal deviation.  Cardiovascular:     Rate and Rhythm: Normal rate and regular rhythm.  Pulmonary:     Effort: Pulmonary effort is normal. No respiratory distress.     Breath sounds: Normal breath sounds. No stridor. No wheezing or rales.  Abdominal:      General: Bowel sounds are normal. There is no distension.     Palpations: Abdomen is soft.     Tenderness: There is no abdominal tenderness. There is no guarding or rebound.  Musculoskeletal:        General: No tenderness.  Skin:  General: Skin is warm and dry.     Findings: No rash.  Neurological:     Mental Status: She is alert.     Cranial Nerves: No cranial nerve deficit (no facial droop, extraocular movements intact, no slurred speech).     Sensory: No sensory deficit.     Motor: No abnormal muscle tone or seizure activity.     Coordination: Coordination normal.  Psychiatric:        Mood and Affect: Affect is blunt.        Behavior: Behavior is not aggressive, withdrawn or combative.     Comments: Patient complains of being homicidal.  Patient's behavior is somewhat and congruous.  She does not seem agitated or violent.      ED Treatments / Results  Labs (all labs ordered are listed, but only abnormal results are displayed) Labs Reviewed  COMPREHENSIVE METABOLIC PANEL - Abnormal; Notable for the following components:      Result Value   BUN 21 (*)    All other components within normal limits  ACETAMINOPHEN LEVEL - Abnormal; Notable for the following components:   Acetaminophen (Tylenol), Serum <10 (*)    All other components within normal limits  CBC - Abnormal; Notable for the following components:   RBC 3.82 (*)    MCV 101.8 (*)    All other components within normal limits  ETHANOL  SALICYLATE LEVEL  RAPID URINE DRUG SCREEN, HOSP PERFORMED  I-STAT BETA HCG BLOOD, ED ([facility], [facility], [facility] ONLY)    Procedures Procedures (including critical care time)  Medications Ordered in ED Medications - No data to display   Initial Impression / Assessment and Plan / ED Course  I have reviewed the triage vital signs and the nursing notes.  Pertinent labs & imaging results that were available during my care of the patient were reviewed by me and considered in my medical decision  making (see chart for details).    Patient has a longstanding history of complaints of suicidal and homicidal ideation.  Patient frequently comes to the ED with the same complaints.  Overall her risk assessment is been felt to be relatively low.  Patient was just recently discharged after being assessed.  At [facility] mental [facility] facility the patient was placed on involuntary commitment.  We will ask our psychiatric services to evaluate her.  Patient is medically cleared.  Final Clinical Impressions(s) / ED Diagnoses   Final diagnoses:  Suicidal ideation  Homicidal ideation     Linwood Dibbles, MD 06/25/18 2134

## 2018-06-25 NOTE — ED Notes (Signed)
Provider stated no orders at this time.

## 2018-06-25 NOTE — ED Notes (Signed)
TTS is at bedside 

## 2018-06-25 NOTE — BH Assessment (Addendum)
Assessment Note  [name] is an 53 y.o. female presenting under IVC via [employer] due to SI/HI. Per IVC, petitioned by [facility] : "Patient reports she has a plan to harm self and kill other people." Patient presented to [facility] ED earlier this date with the same complaint and was discharged as patient is chronically suicidal and this is patient's baseline. She then went directly to [facility] requesting admission. It was noted patient successfully took bus from [facility] ED to [facility] without incident. Patient has accessed ED 25 times in the past 6 months with similar presentation. She states "I want to shoot myself or run into traffic. I want to shoot my sister in law, I wish my brother never married her." Patients states that she does not have access to firearms. Patient reports AH of voices telling her to kill people. Patient repeatedly asked to be sent to [facility] or [facility] because she does not want to go home. Patient denies being under the influence of drugs and alcohol. Patient is with [facility] who provide her with services.  Patient is alert and oriented x 4. She is dressed appropriately but appears to have poor hygiene. Her speech is coherent. Her mood is anxious/irritable and affect is congruent. Her insight, judgement, and impulse control are poor. Patient does not appear to be responding to internal stimuli or experiencing delusional thought content.  Diagnosis: F25.0 Schizoaffective Disorder, bipolar type  Past Medical History:  Past Medical History:  Diagnosis Date  . Anxiety   . Asthma   . Borderline personality disorder (HCC)   . Cerebral palsy (HCC)   . COPD (chronic obstructive pulmonary disease) (HCC)   . Depression   . GERD (gastroesophageal reflux disease)   . Mild cognitive impairment   . Schizoaffective disorder (HCC)   . Wound abscess     Past Surgical History:  Procedure Laterality Date  . CHOLECYSTECTOMY    . INCISION AND DRAINAGE ABSCESS Right 01/02/2015   Procedure:  INCISION AND DRAINAGE ABSCESS;  Surgeon: Tiney Rouge III, MD;  Location: [facility] ORS;  Service: General;  Laterality: Right;  . TONSILLECTOMY      Family History:  Family History  Problem Relation Age of Onset  . Heart attack Father   . Diabetes Mother     Social History:  reports that she has quit smoking. She has never used smokeless tobacco. She reports that she does not drink alcohol or use drugs.  Additional Social History:  Alcohol / Drug Use Pain Medications: see MAR Prescriptions: see MAR Over the Counter: see MAR History of alcohol / drug use?: No history of alcohol / drug abuse  CIWA: CIWA-Ar BP: 132/89 Pulse Rate: 88 COWS:    Allergies:  Allergies  Allergen Reactions  . Penicillins Nausea And Vomiting and Other (See Comments)    Has patient had a PCN reaction causing immediate rash, facial/tongue/throat swelling, SOB or lightheadedness with hypotension: No Has patient had a PCN reaction causing severe rash involving mucus membranes or skin necrosis: No Has patient had a PCN reaction that required hospitalization No Has patient had a PCN reaction occurring within the last 10 years: No If all of the above answers are "NO", then may proceed with Cephalosporin use      Home Medications: (Not in a hospital admission)   OB/GYN Status:  Patient's last menstrual period was 05/28/2018 (lmp unknown).  General Assessment Data Location of Assessment: [facility] ED TTS Assessment: In system Is this a Tele or Face-to-Face Assessment?: Face-to-Face  Is this an Initial Assessment or a Re-assessment for this encounter?: Initial Assessment Patient Accompanied by:: N/A Language Other than English: No Living Arrangements: Other (Comment) What gender do you identify as?: Female Marital status: Single Maiden name: Fennell Pregnancy Status: No Living Arrangements: Parent, Other relatives Can pt return to current living arrangement?: Yes Admission Status: Involuntary Petitioner:  Other([facility]) Is patient capable of signing voluntary admission?: No 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 : Yes Suicidal Intent: Yes-Currently Present Has patient had any suicidal intent within the past 6 months prior to admission? : Yes Is patient at risk for suicide?: No Suicidal Plan?: Yes-Currently Present Has patient had any suicidal plan within the past 6 months prior to admission? : Yes Specify Current Suicidal Plan: running into traffic or shooting herself Access to Means: No Specify Access to Suicidal Means: patient does have access to guns What has been your use of drugs/alcohol within the last 12 months?: denies Previous Attempts/Gestures: Yes How many times?: 7(per patient report) Other Self Harm Risks: none Triggers for Past Attempts: None known Intentional Self Injurious Behavior: Cutting Comment - Self Injurious Behavior: scratches, history of cutting Family Suicide History: Unknown Recent stressful life event(s): Conflict (Comment)(with sister in law) Persecutory voices/beliefs?: No Depression: Yes Depression Symptoms: Despondent, Insomnia, Tearfulness, Isolating, Guilt, Fatigue, Loss of interest in usual pleasures, Feeling worthless/self pity, Feeling angry/irritable Substance abuse history and/or treatment for substance abuse?: No Suicide prevention information given to non-admitted patients: Not applicable  Risk to Others within the past 6 months Homicidal Ideation: Yes-Currently Present Does  patient have any lifetime risk of violence toward others beyond the six months prior to admission? : No Thoughts of Harm to Others: Yes-Currently Present Comment - Thoughts of Harm to Others: ("If I could get a gun I would shoot everyone in here.") Current Homicidal Intent: No Current Homicidal Plan: Yes-Currently Present Describe Current Homicidal Plan: (shooting- no access to firearms) Access to Homicidal Means: No Describe Access to Homicidal Means: none Identified Victim: family History of harm to others?: No Assessment of Violence: None Noted Violent Behavior Description: none known Does patient have access to weapons?: No Criminal Charges Pending?: No Does patient have a court date: No Is patient on probation?: No  Psychosis Hallucinations: Auditory, Visual Delusions: None noted  Mental Status Report Appearance/Hygiene: Unremarkable, Poor hygiene Eye Contact: Good Motor Activity: Freedom of movement Speech: Logical/coherent Level of Consciousness: Alert Mood: Preoccupied, Irritable Affect: Irritable Anxiety Level: Moderate Thought Processes: Coherent Judgement: Impaired Orientation: Person, Place, Time, Situation Obsessive Compulsive Thoughts/Behaviors: Minimal  Cognitive Functioning Concentration: Normal Memory: Recent Intact, Remote Intact Is patient IDD: Yes Level of Function: mild Is IQ score available?: No Insight: Poor Impulse Control: Poor Appetite: Fair Have you had any weight changes? : No Change Sleep: Decreased Total Hours of Sleep: 4 Vegetative Symptoms: None  ADLScreening [facility] Assessment Services) Patient's cognitive ability adequate to safely complete daily activities?: Yes Patient able to express need for assistance with ADLs?: Yes Independently performs ADLs?: Yes (appropriate for developmental age)  Prior Inpatient Therapy Prior Inpatient Therapy: Yes Prior Therapy Dates: Multiple  admits Prior Therapy Facilty/Provider(s): [facility], multiple Reason for Treatment: Per chart, "IDD and Depression."   Prior Outpatient Therapy Prior Outpatient Therapy: Yes Prior Therapy Dates: Ongoing Prior Therapy Facilty/Provider(s): [facility] Reason for Treatment: Medication management and counseling.  Does patient have an ACCT team?: No Does patient have Intensive In-House Services?  : No Does patient have [facility] services? : No Does patient have [facility] services?: No  ADL Screening (condition at time of admission) Patient's cognitive ability adequate to safely complete daily activities?: Yes Is the patient deaf or have difficulty hearing?: No Does the patient have difficulty seeing, even when wearing glasses/contacts?: No Does the patient have difficulty concentrating, remembering, or making decisions?: No Patient able to express need for assistance with ADLs?: Yes Does the patient have difficulty dressing or bathing?: No Independently performs ADLs?: Yes (appropriate for developmental age) Does the patient have difficulty walking or climbing stairs?: No Weakness of Legs: None Weakness of Arms/Hands: None  Home Assistive Devices/Equipment Home Assistive Devices/Equipment: Eyeglasses  Therapy Consults (therapy consults require a physician order) PT Evaluation Needed: No OT Evalulation Needed: No SLP Evaluation Needed: No Abuse/Neglect Assessment (Assessment to be complete while patient is alone) Physical Abuse: Denies Verbal Abuse: Denies Sexual Abuse: Denies Exploitation of patient/patient's resources: Denies Self-Neglect: Denies   Consults Spiritual Care Consult Needed: No Social Work Consult Needed: No Merchant navy officer (For Healthcare) Does Patient Have a Medical Advance Directive?: No Would patient like information on creating a medical advance directive?: No - Patient declined          Disposition: Malachy Chamber, PMHNP recommends patient be observed overnight for safety and stabilization as she is  under IVC with SI/HI and plan. Disposition Initial Assessment Completed for this Encounter: Yes Patient referred to: Other (Comment)([facility])  On Site Evaluation by:   Reviewed with Physician:    Celedonio Miyamoto 06/25/2018 5:58 PM

## 2018-06-25 NOTE — ED Provider Notes (Signed)
[facility] EMERGENCY DEPARTMENT Provider Note   CSN: [id number] Arrival date & time: 06/25/18  1249   History   Chief Complaint Chief Complaint  Patient presents with  . Psychiatric Evaluation    HPI Alzada Brazee is a 53 y.o. female.     HPI   53 year old female presents today with suicidal homicidal ideations.  Patient reports ongoing suicidal homicidal thoughts.  When asked what brings her in she recites that "I am suicidal, homicidal, and violent".  She notes that she has been seen before in the past for this and has had no significant changes in her thoughts or actions.  Spoke with her mother on the phone, she reports that this is baseline as well.    Past Medical History:  Diagnosis Date  . Anxiety   . Asthma   . Borderline personality disorder (HCC)   . Cerebral palsy (HCC)   . COPD (chronic obstructive pulmonary disease) (HCC)   . Depression   . GERD (gastroesophageal reflux disease)   . Mild cognitive impairment   . Schizoaffective disorder (HCC)   . Wound abscess     Patient Active Problem List   Diagnosis Date Noted  . 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 Overdose by acetaminophen 08/08/2016  . Schizoaffective disorder, bipolar type (HCC)   . 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 GERD (gastroesophageal reflux disease) 09/24/2014  . COPD (chronic obstructive pulmonary disease) (HCC) 09/24/2014  . Borderline personality disorder (HCC) 09/24/2014  . Cerebral palsy (HCC) 08/31/2014    Past Surgical History:  Procedure Laterality Date  . CHOLECYSTECTOMY    . INCISION AND DRAINAGE ABSCESS Right 01/02/2015   Procedure: INCISION AND DRAINAGE ABSCESS;  Surgeon: Tiney Rouge III, MD;  Location: [facility] ORS;  Service: General;  Laterality: Right;  . TONSILLECTOMY       OB History    Gravida  0   Para  0   Term  0   Preterm  0   AB  0   Living  0     SAB  0   TAB  0   Ectopic    0   Multiple  0   Live Births               Home Medications    Prior to Admission medications   Medication Sig Start Date End Date Taking? Authorizing Provider  albuterol (PROVENTIL HFA;VENTOLIN HFA) 108 (90 Base) MCG/ACT inhaler Inhale 2 puffs into the lungs every 4 (four) hours as needed for wheezing or shortness of breath. Patient not taking: Reported on 06/21/2018 06/17/18   Charm Rings, NP  aspirin EC 81 MG tablet Take 1 tablet (81 mg total) by mouth daily. 12/14/17   Clapacs, Jackquline Denmark, MD  clonazePAM (KLONOPIN) 0.5 MG tablet Take 1 tablet (0.5 mg total) by mouth 2 (two) times daily. 12/14/17   Clapacs, Jackquline Denmark, MD  divalproex (DEPAKOTE) 500 MG DR tablet Take 1 tablet (500 mg total) by mouth 2 (two) times daily. 12/14/17   Clapacs, Jackquline Denmark, MD  escitalopram (LEXAPRO) 20 MG tablet Take 1 tablet (20 mg total) by mouth daily. 12/14/17   Clapacs, Jackquline Denmark, MD  famotidine (PEPCID) 20 MG tablet Take 1 tablet (20 mg total) by mouth 2 (two) times daily. 06/11/18   Khatri, Hina, PA-C  fenofibrate 160 MG tablet Take 1 tablet (160 mg total) by mouth daily. 12/14/17   Clapacs, Jackquline Denmark, MD  hydrOXYzine (ATARAX/VISTARIL)  25 MG tablet Take 1 tablet (25 mg total) by mouth every 6 (six) hours as needed for anxiety. Patient taking differently: Take 25 mg by mouth 2 (two) times daily.  05/11/18   Fayrene Helper, PA-C  levothyroxine (SYNTHROID, LEVOTHROID) 50 MCG tablet Take 1 tablet (50 mcg total) by mouth daily before breakfast. 12/14/17   Clapacs, Jackquline Denmark, MD  oxybutynin (DITROPAN-XL) 5 MG 24 hr tablet Take 1 tablet (5 mg total) by mouth daily. 12/14/17   Clapacs, Jackquline Denmark, MD  pantoprazole (PROTONIX) 40 MG tablet Take 1 tablet (40 mg total) by mouth daily. 12/14/17   Clapacs, Jackquline Denmark, MD  risperiDONE (RISPERDAL) 3 MG tablet Take 1 tablet (3 mg total) by mouth 2 (two) times daily. Patient taking differently: Take 3 mg by mouth daily.  12/14/17   Clapacs, Jackquline Denmark, MD  sucralfate (CARAFATE) 1 g tablet Take 1 tablet (1 g  total) by mouth 4 (four) times daily -  with meals and at bedtime. Patient taking differently: Take 1 g by mouth daily.  12/14/17   Clapacs, Jackquline Denmark, MD    Family History Family History  Problem Relation Age of Onset  . Heart attack Father   . Diabetes Mother     Social History Social History   Tobacco Use  . Smoking status: Former Games developer  . Smokeless tobacco: Never Used  Substance Use Topics  . Alcohol use: No  . Drug use: No     Allergies   Penicillins   Review of Systems Review of Systems  All other systems reviewed and are negative.    Physical Exam Updated Vital Signs BP (!) 116/91 (BP Location: Right Arm)   Pulse 84   Temp 97.7 F (36.5 C) (Oral)   Resp 16   Ht 5\' 3"  (1.6 m)   Wt 95 kg   LMP 05/28/2018 (LMP Unknown)   SpO2 95%   BMI 37.10 kg/m   Physical Exam Vitals signs and nursing note reviewed.  Constitutional:      Appearance: She is well-developed.  HENT:     Head: Normocephalic and atraumatic.  Eyes:     General: No scleral icterus.       Right eye: No discharge.        Left eye: No discharge.     Conjunctiva/sclera: Conjunctivae normal.     Pupils: Pupils are equal, round, and reactive to light.  Neck:     Musculoskeletal: Normal range of motion.     Vascular: No JVD.     Trachea: No tracheal deviation.  Pulmonary:     Effort: Pulmonary effort is normal.     Breath sounds: No stridor.  Neurological:     Mental Status: She is alert and oriented to person, place, and time.     Coordination: Coordination normal.  Psychiatric:        Behavior: Behavior normal.        Thought Content: Thought content normal.        Judgment: Judgment normal.      ED Treatments / Results  Labs (all labs ordered are listed, but only abnormal results are displayed) Labs Reviewed - No data to display  EKG None  Radiology No results found.  Procedures Procedures (including critical care time)  Medications Ordered in ED Medications - No data  to display   Initial Impression / Assessment and Plan / ED Course  I have reviewed the triage vital signs and the nursing notes.  Pertinent labs & imaging  results that were available during my care of the patient were reviewed by me and considered in my medical decision making (see chart for details).        53 year old female presents today with suicidal and homicidal ideation.  Patient has been here 26 times in the last 6 months.  Presentation is similar to previous.  I spoke with [employer], her mother this is all typical behavior of her.  She has had no significant changes in her baseline suicidality.  I have low suspicion that she truly has suicidal or homicidal ideations.  I spoke with her CST team, they have come to a consensus that they will no longer be picking the patient up from the emergency room as this is such repetitive behavior they do not feel it is secondary to true suicidality.  I spoke with her mother, she agrees that patient is safe for discharge home.  She notes she is 41 and cannot drive but notes that [place] does use the bus regularly and knows how to get home.  She notes the bus comes right by their house.  I do not feel patient requires any further evaluation or management given her significant past medical history of the same.  She would be discharged with a bus pass and encouraged follow-up as an outpatient, she verbalized understanding and agreement to today's plan.  Final Clinical Impressions(s) / ED Diagnoses   Final diagnoses:  Suicidal ideation    ED Discharge Orders    None       Eyvonne Mechanic, PA-C 06/25/18 1517    Melene Plan, DO 06/25/18 [phone]

## 2018-06-25 NOTE — ED Notes (Signed)
See provider note for details

## 2018-06-25 NOTE — Discharge Instructions (Addendum)
Please read attached information. If you experience any new or worsening signs or symptoms please return to the emergency room for evaluation. Please follow-up with your primary care provider or specialist as discussed.  °
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
## 2018-06-26 ENCOUNTER — Other Ambulatory Visit: Payer: Self-pay

## 2018-06-26 ENCOUNTER — Emergency Department (HOSPITAL_COMMUNITY)
Admission: EM | Admit: 2018-06-26 | Discharge: 2018-06-27 | Disposition: A | Discharge status: Home / Self Care | Payer: Medicare Other | Attending: Emergency Medicine | Admitting: Emergency Medicine

## 2018-06-26 ENCOUNTER — Encounter (HOSPITAL_COMMUNITY): Payer: Self-pay

## 2018-06-26 DIAGNOSIS — Z87891 Personal history of nicotine dependence: Secondary | ICD-10-CM | POA: Insufficient documentation

## 2018-06-26 DIAGNOSIS — R45850 Homicidal ideations: Secondary | ICD-10-CM

## 2018-06-26 DIAGNOSIS — F419 Anxiety disorder, unspecified: Secondary | ICD-10-CM | POA: Insufficient documentation

## 2018-06-26 DIAGNOSIS — J449 Chronic obstructive pulmonary disease, unspecified: Secondary | ICD-10-CM | POA: Diagnosis not present

## 2018-06-26 DIAGNOSIS — F70 Mild intellectual disabilities: Secondary | ICD-10-CM

## 2018-06-26 DIAGNOSIS — Z79899 Other long term (current) drug therapy: Secondary | ICD-10-CM | POA: Diagnosis not present

## 2018-06-26 DIAGNOSIS — R45851 Suicidal ideations: Secondary | ICD-10-CM | POA: Diagnosis not present

## 2018-06-26 DIAGNOSIS — Z7982 Long term (current) use of aspirin: Secondary | ICD-10-CM | POA: Diagnosis not present

## 2018-06-26 DIAGNOSIS — F250 Schizoaffective disorder, bipolar type: Secondary | ICD-10-CM | POA: Insufficient documentation

## 2018-06-26 DIAGNOSIS — F603 Borderline personality disorder: Secondary | ICD-10-CM | POA: Diagnosis not present

## 2018-06-26 DIAGNOSIS — F4325 Adjustment disorder with mixed disturbance of emotions and conduct: Secondary | ICD-10-CM

## 2018-06-26 LAB — PREGNANCY, URINE: Preg Test, Ur: NEGATIVE

## 2018-06-26 NOTE — ED Notes (Signed)
Pt changing into purple scrubs now.  

## 2018-06-26 NOTE — ED Notes (Signed)
Patient alert, oriented and ambulatory at discharge. AVS/Follow-up care reviewed with patient and she verbalized understanding. Written copy of AVS given to patient. All patient belongings returned to her. Patient vitals at discharge 98.1-113/74-77-16-95% room air. Patient escorted off unit by staff.

## 2018-06-26 NOTE — ED Provider Notes (Signed)
[facility] EMERGENCY DEPARTMENT Provider Note   CSN: [id number] Arrival date & time: 06/26/18  1650    History   Chief Complaint No chief complaint on file.   HPI [name] is a 53 y.o. female.     Patient presents to the emergency department with a chief complaint of suicidal thoughts.  She was seen recently for the same and had IVC papers taken out of her.  IVC papers were rescinded by psychiatry earlier this afternoon but then she went to [facility] again and had IVC papers taken out on her second time.  She is currently under IVC.  Still complaining of suicidal thoughts.  States that she wants to either slash her wrists, take too many pills, or walk in front of traffic.  She denies alcohol or drug use.  The history is provided by the patient. No language interpreter was used.    Past Medical History:  Diagnosis Date  . Anxiety   . Asthma   . Borderline personality disorder (HCC)   . Cerebral palsy (HCC)   . COPD (chronic obstructive pulmonary disease) (HCC)   . Depression   . GERD (gastroesophageal reflux disease)   . Mild cognitive impairment   . Schizoaffective disorder (HCC)   . Wound abscess     Patient Active Problem List   Diagnosis Date Noted  . 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 Overdose by acetaminophen 08/08/2016  . Schizoaffective disorder, bipolar type (HCC)   . 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 GERD (gastroesophageal reflux disease) 09/24/2014  . COPD (chronic obstructive pulmonary disease) (HCC) 09/24/2014  . Borderline personality disorder (HCC) 09/24/2014  . Cerebral palsy (HCC) 08/31/2014    Past Surgical History:  Procedure Laterality Date  . CHOLECYSTECTOMY    . INCISION AND DRAINAGE ABSCESS Right 01/02/2015   Procedure: INCISION AND DRAINAGE ABSCESS;  Surgeon: Tiney Rouge III, MD;  Location: [facility] ORS;  Service: General;  Laterality: Right;  . TONSILLECTOMY        OB History    Gravida  0   Para  0   Term  0   Preterm  0   AB  0   Living  0     SAB  0   TAB  0   Ectopic  0   Multiple  0   Live Births               Home Medications    Prior to Admission medications   Medication Sig Start Date End Date Taking? Authorizing Provider  albuterol (PROVENTIL HFA;VENTOLIN HFA) 108 (90 Base) MCG/ACT inhaler Inhale 2 puffs into the lungs every 4 (four) hours as needed for wheezing or shortness of breath. Patient not taking: Reported on 06/25/2018 06/17/18   Charm Rings, NP  aspirin EC 81 MG tablet Take 1 tablet (81 mg total) by mouth daily. 12/14/17   Clapacs, Jackquline Denmark, MD  clonazePAM (KLONOPIN) 0.5 MG tablet Take 1 tablet (0.5 mg total) by mouth 2 (two) times daily. 12/14/17   Clapacs, Jackquline Denmark, MD  divalproex (DEPAKOTE) 500 MG DR tablet Take 1 tablet (500 mg total) by mouth 2 (two) times daily. 12/14/17   Clapacs, Jackquline Denmark, MD  escitalopram (LEXAPRO) 20 MG tablet Take 1 tablet (20 mg total) by mouth daily. 12/14/17   Clapacs, Jackquline Denmark, MD  famotidine (PEPCID) 20 MG tablet Take 1 tablet (20 mg total) by mouth 2 (two) times daily. 06/11/18  Khatri, Hina, PA-C  fenofibrate 160 MG tablet Take 1 tablet (160 mg total) by mouth daily. 12/14/17   Clapacs, Jackquline Denmark, MD  hydrOXYzine (ATARAX/VISTARIL) 25 MG tablet Take 1 tablet (25 mg total) by mouth every 6 (six) hours as needed for anxiety. Patient taking differently: Take 25 mg by mouth 2 (two) times daily.  05/11/18   Fayrene Helper, PA-C  levothyroxine (SYNTHROID, LEVOTHROID) 50 MCG tablet Take 1 tablet (50 mcg total) by mouth daily before breakfast. 12/14/17   Clapacs, Jackquline Denmark, MD  oxybutynin (DITROPAN-XL) 5 MG 24 hr tablet Take 1 tablet (5 mg total) by mouth daily. 12/14/17   Clapacs, Jackquline Denmark, MD  pantoprazole (PROTONIX) 40 MG tablet Take 1 tablet (40 mg total) by mouth daily. 12/14/17   Clapacs, Jackquline Denmark, MD  risperiDONE (RISPERDAL) 3 MG tablet Take 1 tablet (3 mg total) by mouth 2 (two) times daily. 12/14/17    Clapacs, Jackquline Denmark, MD  sucralfate (CARAFATE) 1 g tablet Take 1 tablet (1 g total) by mouth 4 (four) times daily -  with meals and at bedtime. Patient taking differently: Take 1 g by mouth daily.  12/14/17   Clapacs, Jackquline Denmark, MD    Family History Family History  Problem Relation Age of Onset  . Heart attack Father   . Diabetes Mother     Social History Social History   Tobacco Use  . Smoking status: Former Games developer  . Smokeless tobacco: Never Used  Substance Use Topics  . Alcohol use: No  . Drug use: No     Allergies   Penicillins   Review of Systems Review of Systems  All other systems reviewed and are negative.    Physical Exam Updated Vital Signs BP 136/83   Pulse 95   Temp (!) 97.5 F (36.4 C) (Oral)   Resp 16   LMP 05/28/2018 (LMP Unknown)   SpO2 95%   Physical Exam Vitals signs and nursing note reviewed.  Constitutional:      Appearance: She is well-developed.  HENT:     Head: Normocephalic and atraumatic.  Eyes:     Conjunctiva/sclera: Conjunctivae normal.     Pupils: Pupils are equal, round, and reactive to light.  Neck:     Musculoskeletal: Normal range of motion and neck supple.  Cardiovascular:     Rate and Rhythm: Normal rate and regular rhythm.     Heart sounds: No murmur. No friction rub. No gallop.   Pulmonary:     Effort: Pulmonary effort is normal. No respiratory distress.     Breath sounds: Normal breath sounds. No wheezing or rales.  Chest:     Chest wall: No tenderness.  Abdominal:     General: Bowel sounds are normal. There is no distension.     Palpations: Abdomen is soft. There is no mass.     Tenderness: There is no abdominal tenderness. There is no guarding or rebound.  Musculoskeletal: Normal range of motion.        General: No tenderness.  Skin:    General: Skin is warm and dry.  Neurological:     Mental Status: She is alert and oriented to person, place, and time.  Psychiatric:        Behavior: Behavior normal.         Thought Content: Thought content normal.        Judgment: Judgment normal.      ED Treatments / Results  Labs (all labs ordered are listed, but only abnormal  results are displayed) Labs Reviewed - No data to display  EKG None  Radiology No results found.  Procedures Procedures (including critical care time)  Medications Ordered in ED Medications - No data to display   Initial Impression / Assessment and Plan / ED Course  I have reviewed the triage vital signs and the nursing notes.  Pertinent labs & imaging results that were available during my care of the patient were reviewed by me and considered in my medical decision making (see chart for details).        Patient with multiple visits for suicidal thoughts.  Seen earlier today by psychiatry and had IVC papers rescinded, but then went to [facility] and had IVC papers taken out of her again.  Currently under IVC.  Vital signs are stable.  Labs from yesterday are reviewed and are reassuring.  Would not order repeat labs today.  Patient medically cleared for TTS consult  Final Clinical Impressions(s) / ED Diagnoses   Final diagnoses:  Suicidal thoughts    ED Discharge Orders    None       Roxy Horseman, PA-C 06/27/18 0604    Ward, Layla Maw, DO 06/27/18 [phone]

## 2018-06-26 NOTE — ED Notes (Signed)
Pt A&O x 3, sleeping at present, no distress noted, calm & cooperative.  Monitoring for safety, Q 15 in checks in effect.

## 2018-06-26 NOTE — ED Notes (Signed)
Incontinent of urine last night. Room smelled strongly of urine and bed was wet when she was encouraged to get up and shower this am before breakfast. During the day she uses the bathroom appropriately.

## 2018-06-26 NOTE — ED Triage Notes (Signed)
Pt coming from [facility] after she was IVC. Pt seen here frequently for psychiatric complaints. Per IVC paperwork she has a plan to kill herself by walking out in front of a car or taking too many pills.

## 2018-06-26 NOTE — BH Assessment (Signed)
[facility] Assessment Progress Note  Per [name], DO, this pt does not require psychiatric hospitalization at this time.  Pt presents under IVC initiated by [facility] staff, which Dr Sharma Covert has rescinded.  Pt is to be discharged from [facility] with recommendation to continue treatment with the [facility] Community Support Team.  This has been included in pt's discharge instructions.  At 12:20 I called [facility] and spoke to Shelton Silvas, notifying him of pt's disposition.  Pt's nurse has been notified.  Doylene Canning, MA Triage Specialist [phone]

## 2018-06-26 NOTE — Discharge Instructions (Signed)
For your behavioral health needs, you are advised to continue treatment with the [facility] Community Support Team:       [facility] Support Team      [address], [address]      [address]      [place], [place]  [address]      [phone]      Crisis number: [phone]

## 2018-06-26 NOTE — Consult Note (Addendum)
[facility] Face-to-Face Psychiatry Consult   Reason for Consult:  Homicidal ideation Referring Physician:  EDP [name] MRN:  [id number] Principal Diagnosis: Adjustment disorder with mixed disturbance of emotions and conduct Diagnosis:  Principal Problem:   Adjustment disorder with mixed disturbance of emotions and conduct Active Problems:   Mild intellectual disability   Borderline personality disorder (HCC)   Schizoaffective disorder, bipolar type (HCC)   Total Time spent with patient: 30 minutes  Subjective:   [name] is a 53 y.o. female patient admitted from [facility] under IVC after she went there and said she was homicidal.   HPI:  Pt was seen and chart reviewed with treatment team and Dr Sharma Covert. Pt is well known to this emergency room. She has had 27 visits in 6 months and no admissions. She lives with her mother and sister-in-law. She has a Administrator, arts through [employer] and also has a day program that she does not want to go to. She wants to go to a group home and her CST is working toward that. She does not like living at home and wants to live at the hospital. She frequently calls 911 and says she is either suicidal or homicidal and they bring her to the hospital. This time she went to [facility] and they placed her under IVC and sent her to the emergency room. She has good insight and is able to articulate that she knows why she is at the hospital. She understands that if she calls 911 they will bring her to the hospital. She frequently asks to go to [facility] because she is mentally sick. 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 She was re-educated on the fact that she needs to make use of the resources that she has in place and to refrain from coming to the emergency room because she does not like living with her family. She was re-educated that the emergency room is for  physically ill patients. She is psychiatrically clear.   Past Psychiatric History: As above  Risk to Self: Chronic SI without any intention to harm self.  Risk to Others: Chronic HI without any intention to harm others.  Prior Inpatient Therapy: Prior Inpatient Therapy: Yes Prior Therapy Dates: Multiple admits Prior Therapy Facilty/Provider(s): [facility], multiple Reason for Treatment: Per chart, "IDD and Depression."  Prior Outpatient Therapy: Prior Outpatient Therapy: Yes Prior Therapy Dates: Ongoing Prior Therapy Facilty/Provider(s): [facility] Reason for Treatment: Medication management and counseling.  Does patient have an ACCT team?: No Does patient have Intensive In-House Services?  : No Does patient have [facility] services? : No Does patient have P4CC services?: No  Past Medical History:  Past Medical History:  Diagnosis Date  . Anxiety   . Asthma   . Borderline personality disorder (HCC)   . Cerebral palsy (HCC)   . COPD (chronic obstructive pulmonary disease) (HCC)   . Depression   . GERD (gastroesophageal reflux disease)   . Mild cognitive impairment   . Schizoaffective disorder (HCC)   . Wound abscess     Past Surgical History:  Procedure Laterality Date  . CHOLECYSTECTOMY    . INCISION AND DRAINAGE ABSCESS Right 01/02/2015   Procedure: INCISION AND DRAINAGE ABSCESS;  Surgeon: Tiney Rouge III, MD;  Location: [facility] ORS;  Service: General;  Laterality: Right;  . TONSILLECTOMY     Family History:  Family History  Problem Relation Age  of Onset  . Heart attack Father   . Diabetes Mother    Family Psychiatric  History: None per chart review.  Social History:  Social History   Substance and Sexual Activity  Alcohol Use No     Social History   Substance and Sexual Activity  Drug Use No    Social History   Socioeconomic History  . Marital status: Single    Spouse name: Not on file  . Number of children: Not on file  . Years of education: Not on file   . Highest education level: Not on file  Occupational History  . Occupation: Disabled  Social Needs  . Financial resource strain: Not on file  . Food insecurity:    Worry: Not on file    Inability: Not on file  . Transportation needs:    Medical: Not on file    Non-medical: Not on file  Tobacco Use  . Smoking status: Former Games developer  . Smokeless tobacco: Never Used  Substance and Sexual Activity  . Alcohol use: No  . Drug use: No  . Sexual activity: Not Currently  Lifestyle  . Physical activity:    Days per week: Not on file    Minutes per session: Not on file  . Stress: Not on file  Relationships  . Social connections:    Talks on phone: Not on file    Gets together: Not on file    Attends religious service: Not on file    Active member of club or organization: Not on file    Attends meetings of clubs or organizations: Not on file    Relationship status: Not on file  Other Topics Concern  . Not on file  Social History Narrative   Pt lives with mother, nephew, sister in law   Additional Social History: N/A    Allergies:   Allergies  Allergen Reactions  . Penicillins Nausea And Vomiting and Other (See Comments)    Has patient had a PCN reaction causing immediate rash, facial/tongue/throat swelling, SOB or lightheadedness with hypotension: No Has patient had a PCN reaction causing severe rash involving mucus membranes or skin necrosis: No Has patient had a PCN reaction that required hospitalization No Has patient had a PCN reaction occurring within the last 10 years: No If all of the above answers are "NO", then may proceed with Cephalosporin use      Labs:  Results for orders placed or performed during the hospital encounter of 06/25/18 (from the past 48 hour(s))  Rapid urine drug screen (hospital performed)     Status: None   Collection Time: 06/25/18  6:09 PM  Result Value Ref Range   Opiates NONE DETECTED NONE DETECTED   Cocaine NONE DETECTED NONE DETECTED    Benzodiazepines NONE DETECTED NONE DETECTED   Amphetamines NONE DETECTED NONE DETECTED   Tetrahydrocannabinol NONE DETECTED NONE DETECTED   Barbiturates NONE DETECTED NONE DETECTED    Comment: (NOTE) DRUG SCREEN FOR MEDICAL PURPOSES ONLY.  IF CONFIRMATION IS NEEDED FOR ANY PURPOSE, NOTIFY LAB WITHIN 5 DAYS. LOWEST DETECTABLE LIMITS FOR URINE DRUG SCREEN Drug Class                     Cutoff (ng/mL) Amphetamine and metabolites    1000 Barbiturate and metabolites    200 Benzodiazepine                 200 Tricyclics and metabolites     300 Opiates and metabolites  300 Cocaine and metabolites        300 THC                            50 Performed at [facility], [address] [place], [place] [address]   Pregnancy, urine     Status: None   Collection Time: 06/25/18  6:09 PM  Result Value Ref Range   Preg Test, Ur NEGATIVE NEGATIVE    Comment:        THE SENSITIVITY OF THIS METHODOLOGY IS >20 mIU/mL. Performed at [facility], [address] [place], [place] [address]   Comprehensive metabolic panel     Status: Abnormal   Collection Time: 06/25/18  6:50 PM  Result Value Ref Range   Sodium 140 135 - 145 mmol/L   Potassium 3.6 3.5 - 5.1 mmol/L   Chloride 105 98 - 111 mmol/L   CO2 25 22 - 32 mmol/L   Glucose, Bld 93 70 - 99 mg/dL   BUN 21 (H) 6 - 20 mg/dL   Creatinine, Ser 8.29 0.44 - 1.00 mg/dL   Calcium 9.1 8.9 - 56.2 mg/dL   Total Protein 7.6 6.5 - 8.1 g/dL   Albumin 3.8 3.5 - 5.0 g/dL   AST 29 15 - 41 U/L   ALT 25 0 - 44 U/L   Alkaline Phosphatase 46 38 - 126 U/L   Total Bilirubin 0.3 0.3 - 1.2 mg/dL   GFR calc non Af Amer >60 >60 mL/min   GFR calc Af Amer >60 >60 mL/min   Anion gap 10 5 - 15    Comment: Performed at [facility], [address], [place], [place] [address]  Ethanol     Status: None   Collection Time: 06/25/18  6:50 PM  Result Value Ref Range   Alcohol, Ethyl (B) <10 <10 mg/dL     Comment: (NOTE) Lowest detectable limit for serum alcohol is 10 mg/dL. For medical purposes only. Performed at [facility], [address] [place], [place] [address]   Salicylate level     Status: None   Collection Time: 06/25/18  6:50 PM  Result Value Ref Range   Salicylate Lvl <7.0 2.8 - 30.0 mg/dL    Comment: Performed at [facility], [address], [place], [place] [address]  Acetaminophen level     Status: Abnormal   Collection Time: 06/25/18  6:50 PM  Result Value Ref Range   Acetaminophen (Tylenol), Serum <10 (L) 10 - 30 ug/mL    Comment: (NOTE) Therapeutic concentrations vary significantly. A range of 10-30 ug/mL  may be an effective concentration for many patients. However, some  are best treated at concentrations outside of this range. Acetaminophen concentrations >150 ug/mL at 4 hours after ingestion  and >50 ug/mL at 12 hours after ingestion are often associated with  toxic reactions. Performed at [facility], [address] [place], [place] [address]   cbc     Status: Abnormal   Collection Time: 06/25/18  6:50 PM  Result Value Ref Range   WBC 8.5 4.0 - 10.5 K/uL   RBC 3.82 (L) 3.87 - 5.11 MIL/uL   Hemoglobin 12.5 12.0 - 15.0 g/dL   HCT 24.4 01.0 - 27.2 %   MCV 101.8 (H) 80.0 - 100.0 fL   MCH 32.7 26.0 - 34.0 pg   MCHC 32.1 30.0 - 36.0 g/dL  RDW 13.4 11.5 - 15.5 %   Platelets 330 150 - 400 K/uL   nRBC 0.0 0.0 - 0.2 %    Comment: Performed at [facility], [address], [place], [place] [address]    Current Facility-Administered Medications  Medication Dose Route Frequency Provider Last Rate Last Dose  . aspirin EC tablet 81 mg  81 mg Oral Daily Linwood Dibbles, MD   81 mg at 06/26/18 0913  . clonazePAM (KLONOPIN) tablet 0.5 mg  0.5 mg Oral BID Linwood Dibbles, MD   0.5 mg at 06/26/18 0913  . divalproex (DEPAKOTE) DR tablet 500 mg  500 mg Oral BID Linwood Dibbles, MD   500 mg at 06/26/18 0914   . escitalopram (LEXAPRO) tablet 20 mg  20 mg Oral Daily Linwood Dibbles, MD   20 mg at 06/26/18 0913  . famotidine (PEPCID) tablet 20 mg  20 mg Oral BID Linwood Dibbles, MD   20 mg at 06/26/18 0914  . fenofibrate tablet 160 mg  160 mg Oral Daily Linwood Dibbles, MD   160 mg at 06/26/18 0914  . hydrOXYzine (ATARAX/VISTARIL) tablet 25 mg  25 mg Oral Q6H PRN Linwood Dibbles, MD   25 mg at 06/25/18 2200  . levothyroxine (SYNTHROID, LEVOTHROID) tablet 50 mcg  50 mcg Oral Q0600 Linwood Dibbles, MD   50 mcg at 06/26/18 0602  . oxybutynin (DITROPAN-XL) 24 hr tablet 5 mg  5 mg Oral Daily Linwood Dibbles, MD   5 mg at 06/26/18 0914  . pantoprazole (PROTONIX) EC tablet 40 mg  40 mg Oral Daily Linwood Dibbles, MD   40 mg at 06/26/18 0913  . risperiDONE (RISPERDAL) tablet 3 mg  3 mg Oral BID Linwood Dibbles, MD   3 mg at 06/26/18 0914  . sucralfate (CARAFATE) tablet 1 g  1 g Oral TID WC & HS Linwood Dibbles, MD   1 g at 06/26/18 1009   Current Outpatient Medications  Medication Sig Dispense Refill  . aspirin EC 81 MG tablet Take 1 tablet (81 mg total) by mouth daily. 30 tablet 1  . clonazePAM (KLONOPIN) 0.5 MG tablet Take 1 tablet (0.5 mg total) by mouth 2 (two) times daily. 60 tablet 1  . divalproex (DEPAKOTE) 500 MG DR tablet Take 1 tablet (500 mg total) by mouth 2 (two) times daily. 60 tablet 1  . escitalopram (LEXAPRO) 20 MG tablet Take 1 tablet (20 mg total) by mouth daily. 30 tablet 1  . famotidine (PEPCID) 20 MG tablet Take 1 tablet (20 mg total) by mouth 2 (two) times daily. 30 tablet 0  . fenofibrate 160 MG tablet Take 1 tablet (160 mg total) by mouth daily. 30 tablet 1  . hydrOXYzine (ATARAX/VISTARIL) 25 MG tablet Take 1 tablet (25 mg total) by mouth every 6 (six) hours as needed for anxiety. (Patient taking differently: Take 25 mg by mouth 2 (two) times daily. ) 12 tablet 0  . oxybutynin (DITROPAN-XL) 5 MG 24 hr tablet Take 1 tablet (5 mg total) by mouth daily. 30 tablet 1  . pantoprazole (PROTONIX) 40 MG tablet Take 1 tablet (40 mg total)  by mouth daily. 30 tablet 1  . risperiDONE (RISPERDAL) 3 MG tablet Take 1 tablet (3 mg total) by mouth 2 (two) times daily. 60 tablet 1  . sucralfate (CARAFATE) 1 g tablet Take 1 tablet (1 g total) by mouth 4 (four) times daily -  with meals and at bedtime. (Patient taking differently: Take 1 g by mouth daily. ) 120  tablet 1  . albuterol (PROVENTIL HFA;VENTOLIN HFA) 108 (90 Base) MCG/ACT inhaler Inhale 2 puffs into the lungs every 4 (four) hours as needed for wheezing or shortness of breath. (Patient not taking: Reported on 06/25/2018) 1 Inhaler 0  . levothyroxine (SYNTHROID, LEVOTHROID) 50 MCG tablet Take 1 tablet (50 mcg total) by mouth daily before breakfast. 30 tablet 1    Musculoskeletal: Strength & Muscle Tone: within normal limits Gait & Station: normal Patient leans: N/A  Psychiatric Specialty Exam: Physical Exam  Nursing note and vitals reviewed. Constitutional: She is oriented to person, place, and time. She appears well-developed and well-nourished.  HENT:  Head: Normocephalic and atraumatic.  Neck: Normal range of motion.  Respiratory: Effort normal.  Musculoskeletal: Normal range of motion.  Neurological: She is alert and oriented to person, place, and time.  Psychiatric: Her speech is normal. Thought content normal. She is agitated. Cognition and memory are impaired. She expresses impulsivity. She exhibits a depressed mood.    Review of Systems  Psychiatric/Behavioral: Positive for depression. Negative for substance abuse.  All other systems reviewed and are negative.   Blood pressure 132/89, pulse 88, temperature 98.4 F (36.9 C), temperature source Oral, resp. rate 17, last menstrual period 05/28/2018, SpO2 97 %.There is no height or weight on file to calculate BMI.  General Appearance: Casual  Eye Contact:  Fair  Speech:  Slow  Volume:  Decreased  Mood:  Depressed  Affect:  Congruent and Depressed  Thought Process:  Coherent, Goal Directed and Descriptions of  Associations: Intact  Orientation:  Full (Time, Place, and Person)  Thought Content:  Illogical  Suicidal Thoughts:  No  Homicidal Thoughts:  No  Memory:  Immediate;   Fair Recent;   Fair Remote;   Fair  Judgement:  Poor  Insight:  Fair  Psychomotor Activity:  Decreased  Concentration:  Concentration: Good and Attention Span: Good  Recall:  Good  Fund of Knowledge:  Good  Language:  Good  Akathisia:  No  Handed:  Right  AIMS (if indicated):   N/A  Assets:  Architect Housing Social Support  ADL's:  Intact  Cognition:  Impaired,  Mild, intellectual developmental delay  Sleep:   N/A     Treatment Plan Summary: Plan Discharge Home  Follow up with Community Support Team for assistance with group home placement and day program assistance Take all medications as directed Call your CST for assistance prior to or before calling 911. Call 911 if an emergency medical or psychiatric condition exist   Disposition: No evidence of imminent risk to self or others at present.   Patient does not meet criteria for psychiatric inpatient admission. Supportive therapy provided about ongoing stressors. Discussed crisis plan, support from social network, calling 911, coming to the Emergency Department, and calling Suicide Hotline.  Laveda Abbe, NP 06/26/2018 11:49 AM   Patient seen face-to-face for psychiatric evaluation, chart reviewed and case discussed with the physician extender and developed treatment plan. Reviewed the information documented and agree with the treatment plan.  [name], DO 06/26/18 1:27 PM

## 2018-06-27 DIAGNOSIS — F419 Anxiety disorder, unspecified: Secondary | ICD-10-CM | POA: Diagnosis not present

## 2018-06-27 MED ORDER — FAMOTIDINE 20 MG PO TABS
20.0000 mg | ORAL_TABLET | Freq: Two times a day (BID) | ORAL | Status: DC
  Administered 2018-06-27: 20 mg via ORAL
  Filled 2018-06-27: qty 1

## 2018-06-27 MED ORDER — RISPERIDONE 3 MG PO TABS
3.0000 mg | ORAL_TABLET | Freq: Two times a day (BID) | ORAL | Status: DC
  Filled 2018-06-27: qty 1

## 2018-06-27 MED ORDER — OXYBUTYNIN CHLORIDE ER 5 MG PO TB24
5.0000 mg | ORAL_TABLET | Freq: Every day | ORAL | Status: DC
  Filled 2018-06-27: qty 1

## 2018-06-27 MED ORDER — LEVOTHYROXINE SODIUM 50 MCG PO TABS: 50.0000 ug | ORAL_TABLET | Freq: Every day | ORAL | Status: DC

## 2018-06-27 MED ORDER — CLONAZEPAM 0.5 MG PO TABS
0.5000 mg | ORAL_TABLET | Freq: Two times a day (BID) | ORAL | Status: DC
  Administered 2018-06-27: 0.5 mg via ORAL
  Filled 2018-06-27: qty 1

## 2018-06-27 MED ORDER — ESCITALOPRAM OXALATE 10 MG PO TABS
20.0000 mg | ORAL_TABLET | Freq: Every day | ORAL | Status: DC
  Administered 2018-06-27: 20 mg via ORAL
  Filled 2018-06-27: qty 2

## 2018-06-27 MED ORDER — DIVALPROEX SODIUM 250 MG PO DR TAB
500.0000 mg | DELAYED_RELEASE_TABLET | Freq: Two times a day (BID) | ORAL | Status: DC
  Administered 2018-06-27: 500 mg via ORAL
  Filled 2018-06-27: qty 2

## 2018-06-27 MED ORDER — HYDROXYZINE HCL 25 MG PO TABS: 25.0000 mg | ORAL_TABLET | Freq: Four times a day (QID) | ORAL | Status: DC | PRN

## 2018-06-27 MED ORDER — SUCRALFATE 1 G PO TABS
1.0000 g | ORAL_TABLET | Freq: Three times a day (TID) | ORAL | Status: DC
  Administered 2018-06-27: 1 g via ORAL
  Filled 2018-06-27: qty 1

## 2018-06-27 NOTE — ED Provider Notes (Signed)
2:55 PM Patient is well-appearing at this time.  Seen evaluated by TTS and felt to be stable from a psychiatric standpoint.  Medically clear at this time.  Discharged in good condition.  Reversal of involuntary commitment forms   Azalia Bilis, MD 06/27/18 1456

## 2018-06-27 NOTE — ED Notes (Signed)
Patient verbalizes understanding of discharge instructions. Opportunity for questioning and answers were provided. Armband removed by staff, pt discharged from ED via wheelchair to home.  

## 2018-06-27 NOTE — ED Notes (Signed)
Pt informed we were are awaiting the psych consult.
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
## 2018-07-09 ENCOUNTER — Encounter (HOSPITAL_COMMUNITY): Payer: Self-pay | Admitting: Emergency Medicine

## 2018-07-09 ENCOUNTER — Other Ambulatory Visit: Payer: Self-pay

## 2018-07-09 ENCOUNTER — Emergency Department (HOSPITAL_COMMUNITY)
Admission: EM | Admit: 2018-07-09 | Discharge: 2018-07-09 | Disposition: A | Discharge status: Home / Self Care | Payer: Medicare Other | Source: Home / Self Care | Attending: Emergency Medicine | Admitting: Emergency Medicine

## 2018-07-09 ENCOUNTER — Emergency Department (HOSPITAL_COMMUNITY)
Admission: EM | Admit: 2018-07-09 | Discharge: 2018-07-09 | Disposition: A | Discharge status: Home / Self Care | Payer: Medicare Other | Attending: Emergency Medicine | Admitting: Emergency Medicine

## 2018-07-09 DIAGNOSIS — Z79899 Other long term (current) drug therapy: Secondary | ICD-10-CM | POA: Insufficient documentation

## 2018-07-09 DIAGNOSIS — J449 Chronic obstructive pulmonary disease, unspecified: Secondary | ICD-10-CM | POA: Insufficient documentation

## 2018-07-09 DIAGNOSIS — F259 Schizoaffective disorder, unspecified: Secondary | ICD-10-CM | POA: Insufficient documentation

## 2018-07-09 DIAGNOSIS — R4689 Other symptoms and signs involving appearance and behavior: Secondary | ICD-10-CM | POA: Diagnosis not present

## 2018-07-09 DIAGNOSIS — Z87891 Personal history of nicotine dependence: Secondary | ICD-10-CM | POA: Insufficient documentation

## 2018-07-09 DIAGNOSIS — F79 Unspecified intellectual disabilities: Secondary | ICD-10-CM | POA: Insufficient documentation

## 2018-07-09 DIAGNOSIS — Z6837 Body mass index (BMI) 37.0-37.9, adult: Secondary | ICD-10-CM | POA: Diagnosis not present

## 2018-07-09 DIAGNOSIS — Z7982 Long term (current) use of aspirin: Secondary | ICD-10-CM | POA: Diagnosis not present

## 2018-07-09 DIAGNOSIS — R45851 Suicidal ideations: Secondary | ICD-10-CM

## 2018-07-09 DIAGNOSIS — G809 Cerebral palsy, unspecified: Secondary | ICD-10-CM | POA: Insufficient documentation

## 2018-07-09 DIAGNOSIS — E669 Obesity, unspecified: Secondary | ICD-10-CM | POA: Diagnosis not present

## 2018-07-09 DIAGNOSIS — R45850 Homicidal ideations: Secondary | ICD-10-CM

## 2018-07-09 LAB — COMPREHENSIVE METABOLIC PANEL
ALBUMIN: 3.7 g/dL (ref 3.5–5.0)
ALT: 32 U/L (ref 0–44)
AST: 37 U/L (ref 15–41)
Alkaline Phosphatase: 40 U/L (ref 38–126)
Anion gap: 7 (ref 5–15)
BILIRUBIN TOTAL: 0.3 mg/dL (ref 0.3–1.2)
BUN: 21 mg/dL — ABNORMAL HIGH (ref 6–20)
CO2: 26 mmol/L (ref 22–32)
Calcium: 9.4 mg/dL (ref 8.9–10.3)
Chloride: 107 mmol/L (ref 98–111)
Creatinine, Ser: 0.78 mg/dL (ref 0.44–1.00)
GFR calc Af Amer: >60 mL/min (ref >60)
GFR calc non Af Amer: >60 mL/min (ref >60)
Glucose, Bld: 96 mg/dL (ref 70–99)
Potassium: 4.2 mmol/L (ref 3.5–5.1)
Sodium: 140 mmol/L (ref 135–145)
TOTAL PROTEIN: 6.9 g/dL (ref 6.5–8.1)

## 2018-07-09 LAB — RAPID URINE DRUG SCREEN, HOSP PERFORMED
Amphetamines: NOT DETECTED
Barbiturates: NOT DETECTED
Benzodiazepines: NOT DETECTED
Cocaine: NOT DETECTED
Opiates: NOT DETECTED
Tetrahydrocannabinol: NOT DETECTED

## 2018-07-09 LAB — CBC WITH DIFFERENTIAL/PLATELET
Abs Immature Granulocytes: 0.05 10*3/uL (ref 0.00–0.07)
Basophils Absolute: 0.1 10*3/uL (ref 0.0–0.1)
Basophils Relative: 1 %
EOS ABS: 0.1 10*3/uL (ref 0.0–0.5)
Eosinophils Relative: 2 %
HCT: 40.4 % (ref 36.0–46.0)
Hemoglobin: 13.1 g/dL (ref 12.0–15.0)
Immature Granulocytes: 1 %
Lymphocytes Relative: 25 %
Lymphs Abs: 1.8 10*3/uL (ref 0.7–4.0)
MCH: 32.3 pg (ref 26.0–34.0)
MCHC: 32.4 g/dL (ref 30.0–36.0)
MCV: 99.5 fL (ref 80.0–100.0)
Monocytes Absolute: 0.8 10*3/uL (ref 0.1–1.0)
Monocytes Relative: 11 %
Neutro Abs: 4.3 10*3/uL (ref 1.7–7.7)
Neutrophils Relative %: 60 %
Platelets: 285 10*3/uL (ref 150–400)
RBC: 4.06 MIL/uL (ref 3.87–5.11)
RDW: 13.3 % (ref 11.5–15.5)
WBC: 7 10*3/uL (ref 4.0–10.5)
nRBC: 0 % (ref 0.0–0.2)

## 2018-07-09 LAB — URINALYSIS, ROUTINE W REFLEX MICROSCOPIC
Bilirubin Urine: NEGATIVE
Glucose, UA: NEGATIVE mg/dL
Hgb urine dipstick: NEGATIVE
Ketones, ur: NEGATIVE mg/dL
Nitrite: NEGATIVE
Protein, ur: NEGATIVE mg/dL
Specific Gravity, Urine: 1.019 (ref 1.005–1.030)
WBC, UA: >50 WBC/hpf — ABNORMAL HIGH (ref 0–5)
pH: 7 (ref 5.0–8.0)

## 2018-07-09 LAB — ACETAMINOPHEN LEVEL: Acetaminophen (Tylenol), Serum: <10 ug/mL — ABNORMAL LOW (ref 10–30)

## 2018-07-09 LAB — ETHANOL: Alcohol, Ethyl (B): <10 mg/dL (ref <10)

## 2018-07-09 LAB — I-STAT BETA HCG BLOOD, ED (MC, WL, AP ONLY): I-stat hCG, quantitative: 7.5 m[IU]/mL — ABNORMAL HIGH (ref <5)
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
## 2018-07-09 NOTE — ED Notes (Signed)
Pt arrived to [facility] - ambulatory. Wearing burgundy scrubs and glasses. Sitter w/pt. Pt voiced understanding and given copy of Medical Clearance Pt Policy. Dinner tray ordered.

## 2018-07-09 NOTE — ED Notes (Signed)
Bed: [facility] Expected date:  Expected time:  Means of arrival:  Comments: 

## 2018-07-09 NOTE — Discharge Instructions (Signed)
Please be sure to follow-up with your primary care doctor and your psychiatrist.  [facility] is working on placement for you for an alternate place to stay.

## 2018-07-09 NOTE — ED Notes (Signed)
D/C paperwork given - ALL belongings - 1 labeled belongings bag - returned to pt - Pt signed verifying all items present.
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
## 2018-07-09 NOTE — Discharge Instructions (Signed)
Substance Abuse Treatment Programs ° °Intensive Outpatient Programs °[facility]     °[address]      °[place], [place]                   °[phone]      ° °[facility] °[address] °[place], [place] °336-379-7146 ° °[facility]     °(Inpatient and outpatient)     °[address]           °[phone]   ° °[facility] °336-288-1484 (Suboxone and Methadone) ° °[address]      °[place], [place] [address]      °336-882-2125      ° °[address] °[place], [place] °852-3033 ° °[facility] (Outpatient/Inpatient, [facility])    °(insurance only) [phone]      °       °[facility] (Groups & Residential) °[place], [place] °336-389-1413 ° °   °[facility]     °[address]     °[place], [place]      °[phone]      ° °[facility] (for caregivers and family) °[address] °[place], [place] °336-299-4655 ° ° ° ° ° °Residential Treatment Programs °[facility]      °[address], [place], [place] [address]  °([phone]      ° °[facility] °[address] [place], [place] [address] °919-419-1059 ° °[facility]        °336-248-8914      ° °[facility] °1-800-659-3381 ° °[facility]             °[address]                                         °[place], [place]                                                °877-615-2722 or 336-784-9470                              ° °[facility] [place] °[address] °[place], [address] °1.877.941.8954 ° °[facility]    °[address]      °, [place]     °336-273-5306      ° °[facility] °[address] °, [place] °336-285-9073 ° °[facility]   °[address]     °[place], [place] [address]     °336-899-1550      °Admissions: 8am-3pm M-F ° °[facility] °[address] °[place],  [place] °336-227-7417 ° °[facility]: Residential Program (90 Days)   °[place], [place]      °336-725-8389 or 800-758-6077    ° °[facility] °[place], [place] °(Walk in Hours over the weekend or by referral) ° °[facility] °[address], [place], [place] [address] °(336) [phone] ° °Crisis Mobile: [facility]:  1-877-626-1772 (for crisis response 24 hours a day) °[facility] Hotline:      1-800-256-2452 °Outpatient Psychiatry and Counseling ° °[facility]: Mobile Crisis   Management 24 hours:  1-877-626-1772 ° °[facility] sliding scale fee and walk in schedule: M-F 8am-12pm/1pm-3pm °[address]  °[place], [place] [address] °[phone] ° °[facility] °[address] °[place], [place] [address] °336-703-9650 ° °[facility] ([facility])- new patient walk-in appointments available Monday - Friday 8am -3pm.          °[address] °[place], [place] [address] °336-676-6840 or crisis line- 336-676-6905 ° °[facility] Intensive Outpatient Therapy Program °[address] °[place], [place] [address] °336-832-9804 ° °[facility]                  °Crisis Services      °336.641.4993      °[address]     °[place], [place] [address]                ° °[facility]   °[facility] °[phone] °[address] °[place], [place] [address] ° ° °[facility]          °[address],  °[place], [place] [address]       °(336) [phone] ° °[facility] °[address] °[place], [place] [address] °336-292-1510 ° °[facility]    °[address]    °[place], [place] [address]     °336-632-3505      ° °Parish McKinney, MD     °[address]     °[place] [address]     °336-282-1251     °  °[facility] °[address] °[place] [address] ° °[facility]     °[address]     °[place], [place]      °336-542-2076      ° °[facility] °Shamsher Ahluwalia, MD °[address] °[place], [place] [address] °336-420-9558 ° °[facility]     °[address]     °[place], [place] [address]     °336-274-1237      ° °[facility] °[address] °[place], [place] [address] °336-854-4450 °Resources for Temporary Residential Assistance/Crisis Centers ° °DAY CENTERS °[facility] °M-F 8am-3pm   °[address] [place], [place] [address]   336-332-0824 °Services include: laundry, barbering, support groups, case management, phone  & computer access, showers, AA/NA mtgs, mental health/substance abuse nurse, job skills class, disability information, VA assistance, spiritual classes, etc.  ° °HOMELESS SHELTERS ° °[facility]     °[facility]   °[name], [place]     °336.271.5959       °       °[facility] (women and children)       °[address] °[place], [place] [address] °336-275-0820 °[email or web address] for application and process °Application Required ° °[facility]   °[address]    °[place] [address]     °336.886.4922       °             °[facility] °[address] °[place], [place] [address] °336.273.5572 °336-235-0363(schedule application appt.) °Application Required ° °[facility] (women only)    °[address]     °[place],  [address]     °336-884-1039      °  Intake starts 6pm daily °Need valid ID, SSC, & Police report °[facility] [place] °[address] °[place], [place] °336-881-5420 °Application Required ° °[facility] (men only)     °[address]      °[place], [place]     °336.748.1962      ° °[facility] °(Pregnant women only) °[address] °[place], [place] °336-275-0206 ° °[facility]      °[address]      °[place], [place] [address]     °336-722-9951      °       °[facility] °[address] °[place], [place] °336-723-1848 °90 day commitment/SA/Application process ° °[facility](men only)     °[address]     °[place], [place]     °336-748-1962       °Check-in at 7pm     °       °[facility] °[address] °[place], [place] [address] °336-248-6684 °Men/Women/Women and Children must be there by 7 pm ° °[facility] °[place], [place] °336-722-8721                ° °

## 2018-07-09 NOTE — BH Assessment (Addendum)
Tele Assessment Note   Patient Name: [name] MRN: [id number] Referring Physician: Ronnie Green Location of Patient: [facility] Location of Provider: Dalynn Green is an 53 y.o. female who presented to [facility] seeking admission to Psychiatry because she got into an argument with her brother.  Patient went to [facility] today despite her family's efforts to keep quarantined to keep from being exposed to the Coronavirus.  Patient had a verbal altercation with her brother and came to ED via city bus stating that, "If I am not admitted to [facility], I am going to stab my brother and me with scissors."  Patient states, "I don't want to go back home."  Patient has been diagnosed with borderline personality disorder and frequents Emergency Departments when she gets upset wit her family.  She has made attempts to kill herself in the past, but only did things like make scratches on herself or take a couple extra pills.  She has never made any serious attempts.  Patient has been to the ED 28+ times seeking admission to the hospital when she is angry.  Patient is IDD mild to moderate and has little impulse control.  She states that she is hearing voices to hurt herself and her brother, but patient has no history of hurting anyone else and is well versed in what to say to try to gain admission to the hospital.  Patient states that she is not sleeping well and states that she has lost 2-3 pounds due to a poor appetite.  Patient denies any history of abuse, but  States that she has a history of cutting, but states that she has not cut herself in a long time.  Patient has been to [facility] for inpatient treatment in the past and also to [facility].  She is tied into [facility] in [place] for her outpatient services and has a Administrator, arts through [employer] ([facility])  TTS contacted [facility] [phone]) and left a message that patient was seen in the ED today to  see if they could follow-up with her on 07/10/2018.  Had to leave a message with a staff member of the ACT Team there who shares an office with patient's team.  TTS also contacted patient's brother, [name] [phone], who stated that patient has been running to the hospital when she gets mad or someone tells her no for the past twenty-years.  He states that she has done this in every county that she has ever lived in.  He states that she has never really been truly suicidal or homicidal.  He states that she never needs to be in a hospital because she only comes when she is mad.  He states that he and his family are looking for a group home for her since she is so unhappy living with them.  Brother states that he loves her to death, but he cannot continue to enable her negative behaviors.  Patient presented as oriented and alert, her thoughts were organized and her memory intact.  Her judgment, insight and impulse control are characteristically poor.  Her eye contact was good, but her speech was slurred.  Her psycho-motor activity was normal.  Her mood was depressed and slightly angry.  She did not appear to be responding to any internal stimuli.  Diagnosis: F31.9 Bipolar Depressed / F60.3 Borderline Personality Disorder  Past Medical History:  Past Medical History:  Diagnosis Date  . Anxiety   . Asthma   .  Borderline personality disorder (HCC)   . Cerebral palsy (HCC)   . COPD (chronic obstructive pulmonary disease) (HCC)   . Depression   . GERD (gastroesophageal reflux disease)   . Mild cognitive impairment   . Schizoaffective disorder (HCC)   . Wound abscess     Past Surgical History:  Procedure Laterality Date  . CHOLECYSTECTOMY    . INCISION AND DRAINAGE ABSCESS Right 01/02/2015   Procedure: INCISION AND DRAINAGE ABSCESS;  Surgeon: Tiney Rouge III, MD;  Location: [facility] ORS;  Service: General;  Laterality: Right;  . TONSILLECTOMY      Family History:  Family History  Problem  Relation Age of Onset  . Heart attack Father   . Diabetes Mother     Social History:  reports that she has quit smoking. She has never used smokeless tobacco. She reports that she does not drink alcohol or use drugs.  Additional Social History:  Alcohol / Drug Use Pain Medications: see MAR Prescriptions: see MAR Over the Counter: see MAR History of alcohol / drug use?: No history of alcohol / drug abuse Longest period of sobriety (when/how long): NA  CIWA: CIWA-Ar BP: (!) 118/93 Pulse Rate: 86 COWS:    Allergies:  Allergies  Allergen Reactions  . Penicillins Nausea And Vomiting and Other (See Comments)    Has patient had a PCN reaction causing immediate rash, facial/tongue/throat swelling, SOB or lightheadedness with hypotension: No Has patient had a PCN reaction causing severe rash involving mucus membranes or skin necrosis: No Has patient had a PCN reaction that required hospitalization No Has patient had a PCN reaction occurring within the last 10 years: No If all of the above answers are "NO", then may proceed with Cephalosporin use      Home Medications: (Not in a hospital admission)   OB/GYN Status:  Patient's last menstrual period was 05/28/2018 (lmp unknown).  General Assessment Data Location of Assessment: [facility] ED TTS Assessment: In system Is this a Tele or Face-to-Face Assessment?: Face-to-Face Is this an Initial Assessment or a Re-assessment for this encounter?: Initial Assessment Patient Accompanied by:: N/A Language Other than English: No Living Arrangements: Other (Comment)(lives with brother) What gender do you identify as?: Female Marital status: Single Maiden name: Kane Pregnancy Status: No Living Arrangements: Other relatives Can pt return to current living arrangement?: Yes Admission Status: Voluntary Is patient capable of signing voluntary admission?: Yes Referral Source: Self/Family/Friend Insurance type: Actor)     Crisis Care  Plan Living Arrangements: Other relatives Legal Guardian: Other:(self) Name of Psychiatrist: [facility] Name of Therapist: [facility]  Education Status Is patient currently in school?: No Is the patient employed, unemployed or receiving disability?: Receiving disability income  Risk to self with the past 6 months Suicidal Ideation: Yes-Currently Present Has patient been a risk to self within the past 6 months prior to admission? : Yes Suicidal Intent: No Has patient had any suicidal intent within the past 6 months prior to admission? : No Is patient at risk for suicide?: Yes Suicidal Plan?: Yes-Currently Present(states that she will stab self) Has patient had any suicidal plan within the past 6 months prior to admission? : Yes Specify Current Suicidal Plan: (has cut wrists in the past and overdosed) Access to Means: Yes Specify Access to Suicidal Means: (household sharps) What has been your use of drugs/alcohol within the last 12 months?: none Previous Attempts/Gestures: Yes How many times?: 7 Other Self Harm Risks: none Triggers for Past Attempts: None known Intentional Self Injurious Behavior:  Cutting Comment - Self Injurious Behavior: states that she has not cut in a long time Family Suicide History: Unknown Recent stressful life event(s): Conflict (Comment)(conflict with brother) Persecutory voices/beliefs?: No Depression: Yes Depression Symptoms: Despondent, Insomnia, Isolating, Loss of interest in usual pleasures, Feeling worthless/self pity Suicide prevention information given to non-admitted patients: Not applicable  Risk to Others within the past 6 months Homicidal Ideation: Yes-Currently Present Does patient have any lifetime risk of violence toward others beyond the six months prior to admission? : No Thoughts of Harm to Others: Yes-Currently Present Comment - Thoughts of Harm to Others: wants to kill brother Current Homicidal Intent: No Current Homicidal Plan:  Yes-Currently Present Describe Current Homicidal Plan: threats to stab him Access to Homicidal Means: No Describe Access to Homicidal Means: household sharps Identified Victim: brother History of harm to others?: No Assessment of Violence: None Noted Violent Behavior Description: none known Does patient have access to weapons?: No Criminal Charges Pending?: No Does patient have a court date: No Is patient on probation?: No  Psychosis Hallucinations: Auditory Delusions: None noted  Mental Status Report Appearance/Hygiene: Disheveled Eye Contact: Good Motor Activity: Freedom of movement Speech: Logical/coherent Level of Consciousness: Alert Mood: Angry, Apathetic Affect: Angry, Depressed Anxiety Level: Moderate Thought Processes: Coherent, Relevant Judgement: Impaired Orientation: Person, Place, Time, Situation Obsessive Compulsive Thoughts/Behaviors: Minimal  Cognitive Functioning Concentration: Normal Memory: Recent Intact, Remote Intact Is patient IDD: Yes Level of Function: (mild) Is IQ score available?: No Insight: Poor Impulse Control: Poor Appetite: Fair Have you had any weight changes? : No Change Sleep: Decreased Total Hours of Sleep: 4 Vegetative Symptoms: None  ADLScreening [facility] Assessment Services) Patient's cognitive ability adequate to safely complete daily activities?: Yes Patient able to express need for assistance with ADLs?: Yes Independently performs ADLs?: Yes (appropriate for developmental age)  Prior Inpatient Therapy Prior Inpatient Therapy: Yes Prior Therapy Dates: Multiple admits Prior Therapy Facilty/Provider(s): [facility] and [facility] Reason for Treatment: (depression)  Prior Outpatient Therapy Prior Outpatient Therapy: Yes Prior Therapy Dates: active Prior Therapy Facilty/Provider(s): [facility] Reason for Treatment: Medication Management Does patient have an ACCT team?: No Does patient have Intensive In-House Services?  :  No Does patient have [facility] services? : No Does patient have [facility] services?: No  ADL Screening (condition at time of admission) Patient's cognitive ability adequate to safely complete daily activities?: Yes Is the patient deaf or have difficulty hearing?: No Does the patient have difficulty seeing, even when wearing glasses/contacts?: No Does the patient have difficulty concentrating, remembering, or making decisions?: No Patient able to express need for assistance with ADLs?: Yes Does the patient have difficulty dressing or bathing?: No Independently performs ADLs?: Yes (appropriate for developmental age) Does the patient have difficulty walking or climbing stairs?: No Weakness of Legs: None Weakness of Arms/Hands: None  Home Assistive Devices/Equipment Home Assistive Devices/Equipment: None  Therapy Consults (therapy consults require a physician order) PT Evaluation Needed: No OT Evalulation Needed: No SLP Evaluation Needed: No Abuse/Neglect Assessment (Assessment to be complete while patient is alone) Abuse/Neglect Assessment Can Be Completed: Yes Physical Abuse: Denies Verbal Abuse: Denies Sexual Abuse: Denies Exploitation of patient/patient's resources: Denies Self-Neglect: Denies Values / Beliefs Cultural Requests During Hospitalization: None Spiritual Requests During Hospitalization: None Consults Spiritual Care Consult Needed: No Social Work Consult Needed: No Merchant navy officer (For Healthcare) Does Patient Have a Medical Advance Directive?: No Would patient like information on creating a medical advance directive?: No - Patient declined Nutrition Screen- [facility] Adult/[facility]/AP Has the patient recently lost  weight without trying?: Yes, 2-13 lbs. Has the patient been eating poorly because of a decreased appetite?: Yes Malnutrition Screening Tool Score: 2      Disposition: Per Malachy Chamber, NP, Patient does not meet inpatient admission criteria and has been Psych  Cleared.  Patient does not like her home situation and is seeking admission for secondary gain because she does not want to return home. [name] Notified of disposition.  Disposition Initial Assessment Completed for this Encounter: Yes Patient referred to: (follow-up with [facility] Community Support Team)  This service was provided via telemedicine using a 2-way, interactive audio and Immunologist.  Names of all persons participating in this telemedicine service and their role in this encounter. Name: [name] Role: patient  Name: [name] Role: TTS  Name:  Role:   Name:  Role:     Daphene Calamity 07/09/2018 6:14 PM

## 2018-07-09 NOTE — ED Provider Notes (Signed)
Emergency Department Provider Note   I have reviewed the triage vital signs and the nursing notes.   HISTORY  Chief Complaint Homicidal and Suicidal   HPI [name] is a 53 y.o. female with PMH of cognitive impariment, Schizoaffective disorder, and COPD presents to the emergency department for evaluation of homicidal ideation.  Patient was discharged from the [facility] emergency department with similar complaint at 19:00.  She was evaluated by [facility] and cleared for discharge. ACT team was consulted along with [facility] Community Support Team. Patient's brother was involved in the case and gave collateral.   Patient tells me that she is here because she is feeling homicidal toward her family and tells me she does not want to go home.  Patient also tells me that she is feeling suicidal.  She has had nearly 30 visits to the emergency department with similar complaints in the past 6 months.   Past Medical History:  Diagnosis Date  . Anxiety   . Asthma   . Borderline personality disorder (HCC)   . Cerebral palsy (HCC)   . COPD (chronic obstructive pulmonary disease) (HCC)   . Depression   . GERD (gastroesophageal reflux disease)   . Mild cognitive impairment   . Schizoaffective disorder (HCC)   . Wound abscess     Patient Active Problem List   Diagnosis Date Noted  . 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 Overdose by acetaminophen 08/08/2016  . Schizoaffective disorder, bipolar type (HCC)   . 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 GERD (gastroesophageal reflux disease) 09/24/2014  . COPD (chronic obstructive pulmonary disease) (HCC) 09/24/2014  . Borderline personality disorder (HCC) 09/24/2014  . Cerebral palsy (HCC) 08/31/2014    Past Surgical History:  Procedure Laterality Date  . CHOLECYSTECTOMY    . INCISION AND DRAINAGE ABSCESS Right 01/02/2015   Procedure: INCISION AND DRAINAGE ABSCESS;  Surgeon: Tiney Rouge III, MD;   Location: [facility] ORS;  Service: General;  Laterality: Right;  . TONSILLECTOMY     Allergies Penicillins  Family History  Problem Relation Age of Onset  . Heart attack Father   . Diabetes Mother     Social History Social History   Tobacco Use  . Smoking status: Former Games developer  . Smokeless tobacco: Never Used  Substance Use Topics  . Alcohol use: No  . Drug use: No    Review of Systems  Constitutional: No fever/chills Eyes: No visual changes. ENT: No sore throat. Cardiovascular: Denies chest pain. Respiratory: Denies shortness of breath. Gastrointestinal: No abdominal pain.  No nausea, no vomiting.  No diarrhea.  No constipation. Genitourinary: Negative for dysuria. Musculoskeletal: Negative for back pain. Skin: Negative for rash. Neurological: Negative for headaches, focal weakness or numbness. Psychiatric:Positive SI/HI.   10-point ROS otherwise negative.  ____________________________________________   PHYSICAL EXAM:  VITAL SIGNS: ED Triage Vitals  Enc Vitals Group     BP 07/09/18 2017 122/63     Pulse Rate 07/09/18 2017 76     Resp 07/09/18 2017 17     Temp 07/09/18 2017 99.1 F (37.3 C)     Temp Source 07/09/18 2017 Oral     SpO2 07/09/18 2017 95 %     Pain Score 07/09/18 2018 6   Constitutional: Alert and oriented. Well appearing and in no acute distress. Eyes: Conjunctivae are normal.  Head: Atraumatic. Nose: No congestion/rhinnorhea. Mouth/Throat: Mucous membranes are moist.  Neck: No stridor.   Cardiovascular: Normal rate, regular rhythm. Respiratory: Normal  respiratory effort.   Gastrointestinal: No distention.  Musculoskeletal: No lower extremity tenderness nor edema. No gross deformities of extremities. Neurologic:  Normal speech and language.  Skin:  Skin is warm, dry and intact. No rash noted.  ____________________________________________  RADIOLOGY  None  ____________________________________________   PROCEDURES  Procedure(s)  performed:   Procedures  None  ____________________________________________   INITIAL IMPRESSION / ASSESSMENT AND PLAN / ED COURSE  Pertinent labs & imaging results that were available during my care of the patient were reviewed by me and considered in my medical decision making (see chart for details).  Patient with frequent ED visits for suicidal/homicidal ideation presents to the emergency department with the same.  She was evaluated by behavioral health 1 hour prior to this ED evaluation and was cleared for discharge.  She has multiple outpatient resources which were involved in her discharge planning along with family.  I do not feel that a second behavioral health consult is indicated at this time.  I discussed this with the patient and feel she is safe for discharge.   ____________________________________________  FINAL CLINICAL IMPRESSION(S) / ED DIAGNOSES  Final diagnoses:  Homicidal ideation    Note:  This document was prepared using Dragon voice recognition software and may include unintentional dictation errors.  Alona Bene, MD Emergency Medicine    Long, Arlyss Repress, MD 07/09/18 2103

## 2018-07-09 NOTE — ED Notes (Signed)
Pt requesting to go home.  Dr. Jacqulyn Bath informed.

## 2018-07-22 ENCOUNTER — Encounter: Payer: Self-pay | Admitting: Emergency Medicine

## 2018-07-22 ENCOUNTER — Other Ambulatory Visit: Payer: Self-pay

## 2018-07-22 ENCOUNTER — Emergency Department
Admission: EM | Admit: 2018-07-22 | Discharge: 2018-07-22 | Disposition: A | Discharge status: Home / Self Care | Payer: Medicare Other | Attending: Emergency Medicine | Admitting: Emergency Medicine

## 2018-07-22 DIAGNOSIS — R21 Rash and other nonspecific skin eruption: Secondary | ICD-10-CM | POA: Diagnosis not present

## 2018-07-22 DIAGNOSIS — R45850 Homicidal ideations: Secondary | ICD-10-CM | POA: Insufficient documentation

## 2018-07-22 DIAGNOSIS — F250 Schizoaffective disorder, bipolar type: Secondary | ICD-10-CM | POA: Diagnosis present

## 2018-07-22 DIAGNOSIS — F70 Mild intellectual disabilities: Secondary | ICD-10-CM | POA: Diagnosis present

## 2018-07-22 DIAGNOSIS — F259 Schizoaffective disorder, unspecified: Secondary | ICD-10-CM | POA: Diagnosis not present

## 2018-07-22 DIAGNOSIS — F603 Borderline personality disorder: Secondary | ICD-10-CM | POA: Diagnosis not present

## 2018-07-22 DIAGNOSIS — Z7982 Long term (current) use of aspirin: Secondary | ICD-10-CM | POA: Diagnosis not present

## 2018-07-22 DIAGNOSIS — Z79899 Other long term (current) drug therapy: Secondary | ICD-10-CM | POA: Diagnosis not present

## 2018-07-22 DIAGNOSIS — R45851 Suicidal ideations: Secondary | ICD-10-CM | POA: Insufficient documentation

## 2018-07-22 DIAGNOSIS — Z87891 Personal history of nicotine dependence: Secondary | ICD-10-CM | POA: Insufficient documentation

## 2018-07-22 DIAGNOSIS — J449 Chronic obstructive pulmonary disease, unspecified: Secondary | ICD-10-CM | POA: Diagnosis not present

## 2018-07-22 LAB — COMPREHENSIVE METABOLIC PANEL
ALT: 24 U/L (ref 0–44)
AST: 30 U/L (ref 15–41)
Albumin: 3.6 g/dL (ref 3.5–5.0)
Alkaline Phosphatase: 47 U/L (ref 38–126)
Anion gap: 8 (ref 5–15)
BUN: 25 mg/dL — ABNORMAL HIGH (ref 6–20)
CO2: 26 mmol/L (ref 22–32)
Calcium: 9.1 mg/dL (ref 8.9–10.3)
Chloride: 110 mmol/L (ref 98–111)
Creatinine, Ser: 0.78 mg/dL (ref 0.44–1.00)
GFR calc Af Amer: >60 mL/min (ref >60)
GFR calc non Af Amer: >60 mL/min (ref >60)
Glucose, Bld: 123 mg/dL — ABNORMAL HIGH (ref 70–99)
Potassium: 3.7 mmol/L (ref 3.5–5.1)
Sodium: 144 mmol/L (ref 135–145)
Total Bilirubin: 0.5 mg/dL (ref 0.3–1.2)
Total Protein: 7.1 g/dL (ref 6.5–8.1)

## 2018-07-22 LAB — CBC
HCT: 37.7 % (ref 36.0–46.0)
Hemoglobin: 12.2 g/dL (ref 12.0–15.0)
MCH: 31.9 pg (ref 26.0–34.0)
MCHC: 32.4 g/dL (ref 30.0–36.0)
MCV: 98.4 fL (ref 80.0–100.0)
Platelets: 308 10*3/uL (ref 150–400)
RBC: 3.83 MIL/uL — ABNORMAL LOW (ref 3.87–5.11)
RDW: 14.5 % (ref 11.5–15.5)
WBC: 6.6 10*3/uL (ref 4.0–10.5)
nRBC: 0 % (ref 0.0–0.2)

## 2018-07-22 LAB — PREGNANCY, URINE: Preg Test, Ur: NEGATIVE

## 2018-07-22 LAB — URINE DRUG SCREEN, QUALITATIVE (ARMC ONLY)
Amphetamines, Ur Screen: NOT DETECTED
Barbiturates, Ur Screen: NOT DETECTED
Benzodiazepine, Ur Scrn: NOT DETECTED
Cannabinoid 50 Ng, Ur ~~LOC~~: NOT DETECTED
Cocaine Metabolite,Ur ~~LOC~~: NOT DETECTED
MDMA (Ecstasy)Ur Screen: POSITIVE — AB
Methadone Scn, Ur: NOT DETECTED
Opiate, Ur Screen: NOT DETECTED
Phencyclidine (PCP) Ur S: NOT DETECTED
Tricyclic, Ur Screen: NOT DETECTED

## 2018-07-22 LAB — ETHANOL: Alcohol, Ethyl (B): <10 mg/dL (ref <10)

## 2018-07-22 LAB — SALICYLATE LEVEL: Salicylate Lvl: <7 mg/dL (ref 2.8–30.0)

## 2018-07-22 LAB — ACETAMINOPHEN LEVEL: Acetaminophen (Tylenol), Serum: <10 ug/mL — ABNORMAL LOW (ref 10–30)

## 2018-07-22 MED ORDER — CLOBETASOL PROPIONATE 0.05 % EX CREA: 1.0000 "application " | TOPICAL_CREAM | Freq: Two times a day (BID) | CUTANEOUS | 0 refills | Status: DC

## 2018-07-22 NOTE — ED Notes (Signed)
BEHAVIORAL HEALTH ROUNDING Patient sleeping: No. Patient alert and oriented: yes Behavior appropriate: Yes.  ; If no, describe:  Nutrition and fluids offered: yes Toileting and hygiene offered: Yes  Sitter present: q15 minute observations and security  monitoring Law enforcement present: Yes  ODS  

## 2018-07-22 NOTE — ED Triage Notes (Addendum)
Patient says she is "suicidal, homicidal and violent" for 3-4 days.   As well she complains of poison ivy.   Says she wants to stab a couple of the people at the group home.

## 2018-07-22 NOTE — Consult Note (Signed)
[facility] Face-to-Face Psychiatry Consult   Reason for Consult:  Homicidal ideation Referring Physician:  Dr. Derrill Kay [name] MRN:  [id number] Principal Diagnosis: Rash Diagnosis:  Principal Problem:   Rash Active Problems:   Mild intellectual disability   Borderline personality disorder (HCC)   Schizoaffective disorder, bipolar type (HCC)  Patient is seen, chart is reviewed, collateral obtained from group home owner Sherri Drone terrain, at [employer] [phone]) Total Time spent with patient: 30 minutes  Subjective: "I want this rash to go away, and I do not want to live at my group home."   HPI:  [name] is a 53 y.o. female patient who presents to the emergency department today because of concerns for rash.  The rash is located on her extremities and neck.  She states that the rash started 3 days ago.  She had been outside and up against a fence.  It is itching.  The patient denies any fevers.  She also states that she has been feeling suicidal and homicidal.  On psychiatric evaluation, patient reports that she had been told not to stand near the fence where there was poison ivy, but she chose to stand there anyway.  She now wants this to make the rash go away.  Patient endorses her baseline suicidal and homicidal ideations.  She states she has thoughts to stab herself with a knife or to stab the caregivers in her group home with a knife.  Patient denies that she has access to a knife, or is ever provided with a knife at the group home.  Patient has never made any attempts to stab herself or stab others.  Patient denies any auditory or visual hallucinations.   Per chart review:  She does have a history of mild intellectual delay but is able to navigate the bus system and perform other acts of self-care.   Collateral is obtained from group home owner, [name]: [name] states that patient was being oppositional and in the front yard where  she knows that residents are not supposed to be.  She believes that patient purposely leaned against the fence in order to get poison ivy.  She denies that patient has been threatening to harm herself or harm others in the group home.  They state they are able to maintain patient safety.  Past Psychiatric History: Schizoaffective disorder, borderline personality disorder, intellectual disability (mild-uncertain if IQ has been documented)  Risk to Self: Chronic SI without any intention to harm self.  Risk to Others: Chronic HI without any intention to harm others.  Prior Inpatient Therapy:  Yes Prior Outpatient Therapy:  Yes  Past Medical History:  Past Medical History:  Diagnosis Date  . Anxiety   . Asthma   . Borderline personality disorder (HCC)   . Cerebral palsy (HCC)   . COPD (chronic obstructive pulmonary disease) (HCC)   . Depression   . GERD (gastroesophageal reflux disease)   . Mild cognitive impairment   . Schizoaffective disorder (HCC)   . Wound abscess     Past Surgical History:  Procedure Laterality Date  . CHOLECYSTECTOMY    . INCISION AND DRAINAGE ABSCESS Right 01/02/2015   Procedure: INCISION AND DRAINAGE ABSCESS;  Surgeon: Tiney Rouge III, MD;  Location: [facility] ORS;  Service: General;  Laterality: Right;  . TONSILLECTOMY     Family History:  Family History  Problem Relation Age of Onset  . Heart attack Father   . Diabetes Mother    Family Psychiatric  History:  None per chart review.  Social History:  Social History   Substance and Sexual Activity  Alcohol Use No     Social History   Substance and Sexual Activity  Drug Use No    Social History   Socioeconomic History  . Marital status: Single    Spouse name: Not on file  . Number of children: Not on file  . Years of education: Not on file  . Highest education level: Not on file  Occupational History  . Occupation: Disabled  Social Needs  . Financial resource strain: Not on file  . Food  insecurity:    Worry: Not on file    Inability: Not on file  . Transportation needs:    Medical: Not on file    Non-medical: Not on file  Tobacco Use  . Smoking status: Former Games developer  . Smokeless tobacco: Never Used  Substance and Sexual Activity  . Alcohol use: No  . Drug use: No  . Sexual activity: Not Currently  Lifestyle  . Physical activity:    Days per week: Not on file    Minutes per session: Not on file  . Stress: Not on file  Relationships  . Social connections:    Talks on phone: Not on file    Gets together: Not on file    Attends religious service: Not on file    Active member of club or organization: Not on file    Attends meetings of clubs or organizations: Not on file    Relationship status: Not on file  Other Topics Concern  . Not on file  Social History Narrative   Pt lives with mother, nephew, sister in law   Additional Social History:  Lives at [facility] group home (owner [name]-[phone])    Allergies:   Allergies  Allergen Reactions  . Penicillins Nausea And Vomiting and Other (See Comments)    Has patient had a PCN reaction causing immediate rash, facial/tongue/throat swelling, SOB or lightheadedness with hypotension: No Has patient had a PCN reaction causing severe rash involving mucus membranes or skin necrosis: No Has patient had a PCN reaction that required hospitalization No Has patient had a PCN reaction occurring within the last 10 years: No If all of the above answers are "NO", then may proceed with Cephalosporin use      Labs:  Results for orders placed or performed during the hospital encounter of 07/22/18 (from the past 48 hour(s))  Comprehensive metabolic panel     Status: Abnormal   Collection Time: 07/22/18  2:57 PM  Result Value Ref Range   Sodium 144 135 - 145 mmol/L   Potassium 3.7 3.5 - 5.1 mmol/L   Chloride 110 98 - 111 mmol/L   CO2 26 22 - 32 mmol/L   Glucose, Bld 123 (H) 70 - 99 mg/dL   BUN 25  (H) 6 - 20 mg/dL   Creatinine, Ser 6.96 0.44 - 1.00 mg/dL   Calcium 9.1 8.9 - 29.5 mg/dL   Total Protein 7.1 6.5 - 8.1 g/dL   Albumin 3.6 3.5 - 5.0 g/dL   AST 30 15 - 41 U/L   ALT 24 0 - 44 U/L   Alkaline Phosphatase 47 38 - 126 U/L   Total Bilirubin 0.5 0.3 - 1.2 mg/dL   GFR calc non Af Amer >60 >60 mL/min   GFR calc Af Amer >60 >60 mL/min   Anion gap 8 5 - 15    Comment: Performed at [facility], [address] [place], [place] [address]  Ethanol     Status: None   Collection Time: 07/22/18  2:57 PM  Result Value Ref Range   Alcohol, Ethyl (B) <10 <10 mg/dL    Comment: (NOTE) Lowest detectable limit for serum alcohol is 10 mg/dL. For medical purposes only. Performed at [facility], [address] [place], [place] [address]   Salicylate level     Status: None   Collection Time: 07/22/18  2:57 PM  Result Value Ref Range   Salicylate Lvl <7.0 2.8 - 30.0 mg/dL    Comment: Performed at [facility], [address] [place], [place] [address]  Acetaminophen level     Status: Abnormal   Collection Time: 07/22/18  2:57 PM  Result Value Ref Range   Acetaminophen (Tylenol), Serum <10 (L) 10 - 30 ug/mL    Comment: (NOTE) Therapeutic concentrations vary significantly. A range of 10-30 ug/mL  may be an effective concentration for many patients. However, some  are best treated at concentrations outside of this range. Acetaminophen concentrations >150 ug/mL at 4 hours after ingestion  and >50 ug/mL at 12 hours after ingestion are often associated with  toxic reactions. Performed at [facility], [address] [place], [place] [address]   cbc     Status: Abnormal   Collection Time: 07/22/18  2:57 PM  Result Value Ref Range   WBC 6.6 4.0 - 10.5 K/uL   RBC 3.83 (L) 3.87 - 5.11 MIL/uL   Hemoglobin 12.2 12.0 - 15.0 g/dL   HCT 29.5 62.1 - 30.8 %   MCV 98.4 80.0 - 100.0 fL   MCH 31.9 26.0 - 34.0 pg   MCHC 32.4 30.0 - 36.0 g/dL   RDW 65.7  84.6 - 96.2 %   Platelets 308 150 - 400 K/uL   nRBC 0.0 0.0 - 0.2 %    Comment: Performed at [facility], [address] [place], [place] [address]  Urine Drug Screen, Qualitative     Status: Abnormal   Collection Time: 07/22/18  2:57 PM  Result Value Ref Range   Tricyclic, Ur Screen NONE DETECTED NONE DETECTED   Amphetamines, Ur Screen NONE DETECTED NONE DETECTED   MDMA (Ecstasy)Ur Screen POSITIVE (A) NONE DETECTED   Cocaine Metabolite,Ur [place] NONE DETECTED NONE DETECTED   Opiate, Ur Screen NONE DETECTED NONE DETECTED   Phencyclidine (PCP) Ur S NONE DETECTED NONE DETECTED   Cannabinoid 50 Ng, Ur [place] NONE DETECTED NONE DETECTED   Barbiturates, Ur Screen NONE DETECTED NONE DETECTED   Benzodiazepine, Ur Scrn NONE DETECTED NONE DETECTED   Methadone Scn, Ur NONE DETECTED NONE DETECTED    Comment: (NOTE) Tricyclics + metabolites, urine    Cutoff 1000 ng/mL Amphetamines + metabolites, urine  Cutoff 1000 ng/mL MDMA (Ecstasy), urine              Cutoff 500 ng/mL Cocaine Metabolite, urine          Cutoff 300 ng/mL Opiate + metabolites, urine        Cutoff 300 ng/mL Phencyclidine (PCP), urine         Cutoff 25 ng/mL Cannabinoid, urine                 Cutoff 50 ng/mL Barbiturates + metabolites, urine  Cutoff 200 ng/mL Benzodiazepine, urine              Cutoff 200 ng/mL Methadone, urine  Cutoff 300 ng/mL The urine drug screen provides only a preliminary, unconfirmed analytical test result and should not be used for non-medical purposes. Clinical consideration and professional judgment should be applied to any positive drug screen result due to possible interfering substances. A more specific alternate chemical method must be used in order to obtain a confirmed analytical result. Gas chromatography / mass spectrometry (GC/MS) is the preferred confirmat ory method. Performed at [facility], [address] [place], [place] [address]     No current  facility-administered medications for this encounter.    Current Outpatient Medications  Medication Sig Dispense Refill  . albuterol (PROVENTIL HFA;VENTOLIN HFA) 108 (90 Base) MCG/ACT inhaler Inhale 2 puffs into the lungs every 4 (four) hours as needed for wheezing or shortness of breath. (Patient not taking: Reported on 06/25/2018) 1 Inhaler 0  . aspirin EC 81 MG tablet Take 1 tablet (81 mg total) by mouth daily. 30 tablet 1  . clobetasol cream (TEMOVATE) 0.05 % Apply 1 application topically 2 (two) times daily. 30 g 0  . clonazePAM (KLONOPIN) 0.5 MG tablet Take 1 tablet (0.5 mg total) by mouth 2 (two) times daily. 60 tablet 1  . divalproex (DEPAKOTE) 500 MG DR tablet Take 1 tablet (500 mg total) by mouth 2 (two) times daily. 60 tablet 1  . escitalopram (LEXAPRO) 20 MG tablet Take 1 tablet (20 mg total) by mouth daily. 30 tablet 1  . famotidine (PEPCID) 20 MG tablet Take 1 tablet (20 mg total) by mouth 2 (two) times daily. 30 tablet 0  . fenofibrate 160 MG tablet Take 1 tablet (160 mg total) by mouth daily. 30 tablet 1  . hydrOXYzine (ATARAX/VISTARIL) 25 MG tablet Take 1 tablet (25 mg total) by mouth every 6 (six) hours as needed for anxiety. (Patient taking differently: Take 25 mg by mouth 2 (two) times daily. ) 12 tablet 0  . levothyroxine (SYNTHROID, LEVOTHROID) 50 MCG tablet Take 1 tablet (50 mcg total) by mouth daily before breakfast. 30 tablet 1  . oxybutynin (DITROPAN-XL) 5 MG 24 hr tablet Take 1 tablet (5 mg total) by mouth daily. 30 tablet 1  . pantoprazole (PROTONIX) 40 MG tablet Take 1 tablet (40 mg total) by mouth daily. 30 tablet 1  . risperiDONE (RISPERDAL) 3 MG tablet Take 1 tablet (3 mg total) by mouth 2 (two) times daily. 60 tablet 1  . sucralfate (CARAFATE) 1 g tablet Take 1 tablet (1 g total) by mouth 4 (four) times daily -  with meals and at bedtime. (Patient taking differently: Take 1 g by mouth daily. ) 120 tablet 1    Musculoskeletal: Strength & Muscle Tone: within normal  limits Gait & Station: normal Patient leans: N/A  Psychiatric Specialty Exam: Physical Exam  Nursing note and vitals reviewed. Constitutional: She is oriented to person, place, and time. She appears well-developed and well-nourished.  HENT:  Head: Normocephalic and atraumatic.  Eyes: EOM are normal.  Neck: Normal range of motion.  Cardiovascular: Normal rate and regular rhythm.  Respiratory: Effort normal. No respiratory distress.  Musculoskeletal: Normal range of motion.  Neurological: She is alert and oriented to person, place, and time.  Skin:  Generalized allergic rash on arms and chest  Psychiatric: Her speech is normal. Her affect is blunt. She is slowed. She is not agitated. Thought content is not paranoid. Cognition and memory are impaired. She expresses no suicidal plans and no homicidal plans.    Review of Systems  Psychiatric/Behavioral: Positive for suicidal ideas (  passive, chronic at baseline). Negative for depression, hallucinations and substance abuse. The patient is not nervous/anxious and does not have insomnia.   All other systems reviewed and are negative.   Blood pressure (!) 99/55, pulse 75, resp. rate 16, height 5\' 3"  (1.6 m), weight 95 kg, last menstrual period 05/28/2018, SpO2 94 %.Body mass index is 37.1 kg/m.  General Appearance: Casual  Eye Contact:  Fair  Speech:  Slow  Volume:  Decreased  Mood:  Dysphoric  Affect:  Constricted  Thought Process:  Coherent, Goal Directed and Descriptions of Associations: Intact  Orientation:  Full (Time, Place, and Person)  Thought Content:  Hallucinations: None, Rumination and Tangential  Suicidal Thoughts:  chronic  Homicidal Thoughts:  chronic  Memory:  Immediate;   Fair Recent;   Fair Remote;   Fair  Judgement:  Poor  Insight:  Fair  Psychomotor Activity:  Decreased  Concentration:  Concentration: Good and Attention Span: Good  Recall:  Good  Fund of Knowledge:  Good  Language:  Good  Akathisia:  No   Handed:  Right  AIMS (if indicated):   N/A  Assets:  ArchitectCommunication Skills Financial Resources/Insurance Housing Social Support  ADL's:  Intact  Cognition:  Impaired,  Mild, intellectual developmental delay  Sleep:   N/A     Treatment Plan Summary: Plan Discharge Home  [facility] Take all medications as directed Call your CST for assistance prior to or before calling 911. Call 911 if an emergency medical or psychiatric condition exist She was re-educated on the fact that she needs to make use of the resources that she has in place and to refrain from coming to the emergency room because she does not like living with her family. She was re-educated that the emergency room is for physically ill patients. She is psychiatrically clear.   Disposition: No evidence of imminent risk to self or others at present.   Patient does not meet criteria for psychiatric inpatient admission. Supportive therapy provided about ongoing stressors. Discussed crisis plan, support from social network, calling 911, coming to the Emergency Department, and calling Suicide Hotline.   She was able to engage in safety planning including plan to return to nearest emergency room or contact emergency services if she feels unable to maintain her own safety or the safety of others. Patient had no further questions, comments, or concerns.  Discharge into care of group home staff, who agrees to maintain patient safety.   Mariel CraftSHEILA M Tris Howell, MD 07/22/2018 4:14 PM

## 2018-07-22 NOTE — ED Notes (Signed)
Melanie spoke with Lamonte in lab to add on urine preg

## 2018-07-22 NOTE — ED Provider Notes (Signed)
[facility] Emergency Department Provider Note   ____________________________________________   I have reviewed the triage vital signs and the nursing notes.   HISTORY  Chief Complaint Rash  History limited by: Not Limited   HPI [name] is a 53 y.o. female who presents to the emergency department today because of concerns for rash.  The rash is located on her extremities and neck.  She states that the rash started 3 days ago.  She had been outside and up against a fence.  It is itching.  The patient denies any fevers.  She also states that she has been feeling suicidal and homicidal.   Records reviewed. Per medical record review patient has a history of frequent emergency department visits, 31 in the past 6 months at [facility] facilities.   Past Medical History:  Diagnosis Date  . Anxiety   . Asthma   . Borderline personality disorder (HCC)   . Cerebral palsy (HCC)   . COPD (chronic obstructive pulmonary disease) (HCC)   . Depression   . GERD (gastroesophageal reflux disease)   . Mild cognitive impairment   . Schizoaffective disorder (HCC)   . Wound abscess     Patient Active Problem List   Diagnosis Date Noted  . 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 Overdose by acetaminophen 08/08/2016  . Schizoaffective disorder, bipolar type (HCC)   . 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 GERD (gastroesophageal reflux disease) 09/24/2014  . COPD (chronic obstructive pulmonary disease) (HCC) 09/24/2014  . Borderline personality disorder (HCC) 09/24/2014  . Cerebral palsy (HCC) 08/31/2014    Past Surgical History:  Procedure Laterality Date  . CHOLECYSTECTOMY    . INCISION AND DRAINAGE ABSCESS Right 01/02/2015   Procedure: INCISION AND DRAINAGE ABSCESS;  Surgeon: Tiney Rouge III, MD;  Location: [facility] ORS;  Service: General;  Laterality: Right;  . TONSILLECTOMY      Prior to Admission medications    Medication Sig Start Date End Date Taking? Authorizing Provider  albuterol (PROVENTIL HFA;VENTOLIN HFA) 108 (90 Base) MCG/ACT inhaler Inhale 2 puffs into the lungs every 4 (four) hours as needed for wheezing or shortness of breath. Patient not taking: Reported on 06/25/2018 06/17/18   Charm Rings, NP  aspirin EC 81 MG tablet Take 1 tablet (81 mg total) by mouth daily. 12/14/17   Clapacs, Jackquline Denmark, MD  clonazePAM (KLONOPIN) 0.5 MG tablet Take 1 tablet (0.5 mg total) by mouth 2 (two) times daily. 12/14/17   Clapacs, Jackquline Denmark, MD  divalproex (DEPAKOTE) 500 MG DR tablet Take 1 tablet (500 mg total) by mouth 2 (two) times daily. 12/14/17   Clapacs, Jackquline Denmark, MD  escitalopram (LEXAPRO) 20 MG tablet Take 1 tablet (20 mg total) by mouth daily. 12/14/17   Clapacs, Jackquline Denmark, MD  famotidine (PEPCID) 20 MG tablet Take 1 tablet (20 mg total) by mouth 2 (two) times daily. 06/11/18   Khatri, Hina, PA-C  fenofibrate 160 MG tablet Take 1 tablet (160 mg total) by mouth daily. 12/14/17   Clapacs, Jackquline Denmark, MD  hydrOXYzine (ATARAX/VISTARIL) 25 MG tablet Take 1 tablet (25 mg total) by mouth every 6 (six) hours as needed for anxiety. Patient taking differently: Take 25 mg by mouth 2 (two) times daily.  05/11/18   Fayrene Helper, PA-C  levothyroxine (SYNTHROID, LEVOTHROID) 50 MCG tablet Take 1 tablet (50 mcg total) by mouth daily before breakfast. 12/14/17   Clapacs, Jackquline Denmark, MD  oxybutynin (DITROPAN-XL) 5 MG 24 hr  tablet Take 1 tablet (5 mg total) by mouth daily. 12/14/17   Clapacs, Jackquline Denmark, MD  pantoprazole (PROTONIX) 40 MG tablet Take 1 tablet (40 mg total) by mouth daily. 12/14/17   Clapacs, Jackquline Denmark, MD  risperiDONE (RISPERDAL) 3 MG tablet Take 1 tablet (3 mg total) by mouth 2 (two) times daily. 12/14/17   Clapacs, Jackquline Denmark, MD  sucralfate (CARAFATE) 1 g tablet Take 1 tablet (1 g total) by mouth 4 (four) times daily -  with meals and at bedtime. Patient taking differently: Take 1 g by mouth daily.  12/14/17   Clapacs, Jackquline Denmark, MD     Allergies Penicillins  Family History  Problem Relation Age of Onset  . Heart attack Father   . Diabetes Mother     Social History Social History   Tobacco Use  . Smoking status: Former Games developer  . Smokeless tobacco: Never Used  Substance Use Topics  . Alcohol use: No  . Drug use: No    Review of Systems Constitutional: No fever/chills Eyes: No visual changes. ENT: No sore throat. Cardiovascular: Denies chest pain. Respiratory: Denies shortness of breath. Gastrointestinal: No abdominal pain.  No nausea, no vomiting.  No diarrhea.   Genitourinary: Negative for dysuria. Musculoskeletal: Negative for back pain. Skin: Positive for rash.  Neurological: Negative for headaches, focal weakness or numbness.  ____________________________________________   PHYSICAL EXAM:  VITAL SIGNS: ED Triage Vitals [07/22/18 1445]  Enc Vitals Group     BP (!) 99/55     Pulse Rate 75     Resp 16     Temp      Temp src      SpO2 94 %     Weight 209 lb 7 oz (95 kg)     Height  (1.6 m)     Head Circumference      Peak Flow      Pain Score 10     Pain Loc      Pain Edu?      Excl. in GC?      Constitutional: Alert and oriented. Appears calm Eyes: Conjunctivae are normal.  ENT      Head: Normocephalic and atraumatic.      Nose: No congestion/rhinnorhea.      Mouth/Throat: Mucous membranes are moist.      Neck: No stridor. Respiratory: Normal respiratory effort without tachypnea nor retractions.  Musculoskeletal: Normal range of motion in all extremities.  Neurologic:  Normal speech and language. No gross focal neurologic deficits are appreciated.  Skin:  Skin is warm, dry and intact. No rash noted. Psychiatric: Mood and affect are normal. Speech and behavior are normal. Patient exhibits appropriate insight and judgment. Calm.   ____________________________________________    LABS (pertinent  positives/negatives)  None  ____________________________________________   EKG  None  ____________________________________________    RADIOLOGY  None  ____________________________________________   PROCEDURES  Procedures  ____________________________________________   INITIAL IMPRESSION / ASSESSMENT AND PLAN / ED COURSE  Pertinent labs & imaging results that were available during my care of the patient were reviewed by me and considered in my medical decision making (see chart for details).   Patient presented to the emergency department today because of concerns for rash.  She had been outside when it started.  At this point he does appear to be numbness likely secondary to poison ivy or oak.  Discussed this with the patient.  Will discharge with high potency steroid cream.  She is also complaining of  some SI and HI.  This is the patient's baseline.  She is well-known both to myself and our facility.  Psychiatry did talk to the patient and agrees that she is at her baseline.  Will discharge home.  ____________________________________________   FINAL CLINICAL IMPRESSION(S) / ED DIAGNOSES  Final diagnoses:  Rash     Note: This dictation was prepared with Dragon dictation. Any transcriptional errors that result from this process are unintentional     Phineas SemenGoodman, Rodneisha Bonnet, MD 07/22/18 1527

## 2018-07-22 NOTE — Discharge Instructions (Addendum)
Please seek medical attention and help for any thoughts about wanting to harm yourself, harm others, any concerning change in behavior, severe depression, inappropriate drug use or any other new or concerning symptoms. ° °

## 2018-07-22 NOTE — ED Notes (Signed)

## 2018-07-23 ENCOUNTER — Other Ambulatory Visit: Payer: Self-pay

## 2018-07-23 ENCOUNTER — Encounter: Payer: Self-pay | Admitting: Emergency Medicine

## 2018-07-23 ENCOUNTER — Emergency Department
Admission: EM | Admit: 2018-07-23 | Discharge: 2018-07-24 | Disposition: A | Discharge status: Home / Self Care | Payer: Medicare Other | Attending: Emergency Medicine | Admitting: Emergency Medicine

## 2018-07-23 DIAGNOSIS — Z79899 Other long term (current) drug therapy: Secondary | ICD-10-CM | POA: Diagnosis not present

## 2018-07-23 DIAGNOSIS — F259 Schizoaffective disorder, unspecified: Secondary | ICD-10-CM | POA: Diagnosis not present

## 2018-07-23 DIAGNOSIS — R21 Rash and other nonspecific skin eruption: Secondary | ICD-10-CM | POA: Diagnosis not present

## 2018-07-23 DIAGNOSIS — R4589 Other symptoms and signs involving emotional state: Secondary | ICD-10-CM | POA: Diagnosis present

## 2018-07-23 DIAGNOSIS — J449 Chronic obstructive pulmonary disease, unspecified: Secondary | ICD-10-CM | POA: Insufficient documentation

## 2018-07-23 DIAGNOSIS — R4689 Other symptoms and signs involving appearance and behavior: Secondary | ICD-10-CM | POA: Diagnosis not present

## 2018-07-23 DIAGNOSIS — Z7982 Long term (current) use of aspirin: Secondary | ICD-10-CM | POA: Diagnosis not present

## 2018-07-23 DIAGNOSIS — R45851 Suicidal ideations: Secondary | ICD-10-CM

## 2018-07-23 DIAGNOSIS — F79 Unspecified intellectual disabilities: Secondary | ICD-10-CM

## 2018-07-23 LAB — SALICYLATE LEVEL: Salicylate Lvl: <7 mg/dL (ref 2.8–30.0)

## 2018-07-23 LAB — CBC
HCT: 38.2 % (ref 36.0–46.0)
Hemoglobin: 12.3 g/dL (ref 12.0–15.0)
MCH: 32.1 pg (ref 26.0–34.0)
MCHC: 32.2 g/dL (ref 30.0–36.0)
MCV: 99.7 fL (ref 80.0–100.0)
Platelets: 295 10*3/uL (ref 150–400)
RBC: 3.83 MIL/uL — ABNORMAL LOW (ref 3.87–5.11)
RDW: 14.4 % (ref 11.5–15.5)
WBC: 7.2 10*3/uL (ref 4.0–10.5)
nRBC: 0 % (ref 0.0–0.2)

## 2018-07-23 LAB — COMPREHENSIVE METABOLIC PANEL
ALT: 21 U/L (ref 0–44)
AST: 28 U/L (ref 15–41)
Albumin: 3.6 g/dL (ref 3.5–5.0)
Alkaline Phosphatase: 44 U/L (ref 38–126)
Anion gap: 10 (ref 5–15)
BUN: 25 mg/dL — ABNORMAL HIGH (ref 6–20)
CO2: 22 mmol/L (ref 22–32)
Calcium: 8.8 mg/dL — ABNORMAL LOW (ref 8.9–10.3)
Chloride: 110 mmol/L (ref 98–111)
Creatinine, Ser: 0.63 mg/dL (ref 0.44–1.00)
GFR calc Af Amer: >60 mL/min (ref >60)
GFR calc non Af Amer: >60 mL/min (ref >60)
Glucose, Bld: 94 mg/dL (ref 70–99)
Potassium: 3.7 mmol/L (ref 3.5–5.1)
Sodium: 142 mmol/L (ref 135–145)
Total Bilirubin: 0.4 mg/dL (ref 0.3–1.2)
Total Protein: 6.8 g/dL (ref 6.5–8.1)

## 2018-07-23 LAB — ACETAMINOPHEN LEVEL: Acetaminophen (Tylenol), Serum: <10 ug/mL — ABNORMAL LOW (ref 10–30)

## 2018-07-23 LAB — ETHANOL: Alcohol, Ethyl (B): <10 mg/dL (ref <10)

## 2018-07-23 NOTE — ED Notes (Signed)
As soon as pt arrived to room, she asked for something to eat.  Pt given crackers, peanut butter and apple juice

## 2018-07-23 NOTE — Consult Note (Signed)
[facility] Face-to-Face Psychiatry Consult   Reason for Consult: Suicidal ideation Referring Physician:  Dr. Lenard Lance [name] MRN:  [id number] Principal Diagnosis: Suicidal behavior without attempted self-injury Diagnosis:  Principal Problem:   Suicidal behavior without attempted self-injury   Total Time spent with patient: 30 minutes  Subjective: " Why can I stay here?"  "I do not want to live at my group home."  [name] is a 53 y.o. female patient presented to [facility] ED via law enforcement under involuntary commitment status (IVC).  This is the patient's 16th emergency room visit this month.  Patient is chronically suicidal and homicidal with out any intent or having a plan voiced the patient did tell to ED nurse that she was going to overdose on some pills.  The patient currently lives in a group home which all medications and knives are locked up and there is staff available 24 hours a day 7 days a week.  The patient was seen face-to-face by this provider; chart reviewed and consulted with Dr.Paduchowski on 07/23/2018 due to the care of the patient. It was discussed with the provider that the patient does not meet criteria to be admitted to the inpatient unit.   Due to the patient voicing suicidal ideation without a plan during her encounter with this provider.  The patient does live in a group home and the group home staff have put many safety measures into place due to the patient multiple times of her voicing suicidal ideation.  On evaluation the patient is alert and oriented x4, calm and cooperative, and mood-congruent with affect. The patient does not appear to be responding to internal or external stimuli. Neither is the patient presenting with any delusional thinking. The patient denies auditory or visual hallucinations. The patient does admit to suicidal, homicidal, or self-harm ideations, but without a plan. Speaking to the group home owner [name]  terrain, at [employer] [phone]) who voice that they have taken  many measures to maintain the patient safety. She discussed, because of the patient voicing on multiple occasions suicidal and homicidal ideations.  The patient is not presenting with any psychotic or paranoid behaviors. During an encounter with the patient, she was able to answer questions appropriately.  Collateral was obtained by [name], stated that the patient was standing at the door and she was asked not to because no one was going out.  She became upset and demanded to leave the group home.  Her ACT-team was called and a staff member came over trying to redirect her for about 3 hours with no success.  She insists that she wanted to be sent to the hospital.  [name] who expresses "I am at my ends with [name] and we have giver her 30 days notice."  "Due to [place] continuously calling EMS and the police to get to the hospital and putting our clients and staff at risk for the COVID 19 virus."  "This is unacceptable and I do not know how the ED staffs allows her to get away with it."  Ms. Terrain, stated that the patient knows she can come to the ED and be seen knowing that she does not have access to what ever she say she is going to use to hurt herself.  Plan: the patient is not a safety risk to herself or others and currently does not require inpatient stabilization and treatment.  The patient will remain overnight in the ED and will be pickup by a group  home staff member on July 24, 2018 between the hours of 12:30 PM to 1 PM.  HPI:  Per Dr. Lenard LancePaduchowski: Sherri DonningKimberly Green is a 53 y.o. female with a past medical history of anxiety, asthma, cerebral palsy, COPD, cognitive impairment, schizoaffective who presents to the emergency department with suicidal ideation.  According to the patient ever since moving into her group home she has felt suicidal because she does not want to live at the group home per patient.   Patient is also covered in a rash that was recently diagnosed as poison ivy, using a steroid cream but states it is no longer itching or bothering her.  Patient denies any medical complaints today.  No cough congestion or fever.  Denies any recent travel.  Past Psychiatric History:  Anxiety Borderline personality disorder (HCC) Depression Mild cognitive impairment Schizoaffective disorder (HCC) Adjustment disorder with mixed disturbance of emotions and conduct  Risk to Self:  No Risk to Others:  No Prior Inpatient Therapy:  Yes Prior Outpatient Therapy:  Yes  Past Medical History:  Past Medical History:  Diagnosis Date  . Anxiety   . Asthma   . Borderline personality disorder (HCC)   . Cerebral palsy (HCC)   . COPD (chronic obstructive pulmonary disease) (HCC)   . Depression   . GERD (gastroesophageal reflux disease)   . Mild cognitive impairment   . Schizoaffective disorder (HCC)   . Wound abscess     Past Surgical History:  Procedure Laterality Date  . CHOLECYSTECTOMY    . INCISION AND DRAINAGE ABSCESS Right 01/02/2015   Procedure: INCISION AND DRAINAGE ABSCESS;  Surgeon: Tiney Rougealph Ely III, MD;  Location: [facility] ORS;  Service: General;  Laterality: Right;  . TONSILLECTOMY     Family History:  Family History  Problem Relation Age of Onset  . Heart attack Father   . Diabetes Mother    Family Psychiatric  History: History reviewed. No pertinent family history  Social History:  Social History   Substance and Sexual Activity  Alcohol Use No     Social History   Substance and Sexual Activity  Drug Use No    Social History   Socioeconomic History  . Marital status: Single    Spouse name: Not on file  . Number of children: Not on file  . Years of education: Not on file  . Highest education level: Not on file  Occupational History  . Occupation: Disabled  Social Needs  . Financial resource strain: Not on file  . Food insecurity:    Worry: Not on file     Inability: Not on file  . Transportation needs:    Medical: Not on file    Non-medical: Not on file  Tobacco Use  . Smoking status: Former Games developermoker  . Smokeless tobacco: Never Used  Substance and Sexual Activity  . Alcohol use: No  . Drug use: No  . Sexual activity: Not Currently  Lifestyle  . Physical activity:    Days per week: Not on file    Minutes per session: Not on file  . Stress: Not on file  Relationships  . Social connections:    Talks on phone: Not on file    Gets together: Not on file    Attends religious service: Not on file    Active member of club or organization: Not on file    Attends meetings of clubs or organizations: Not on file    Relationship status: Not on file  Other Topics Concern  .  Not on file  Social History Narrative   Pt lives with mother, nephew, sister in law   Additional Social History:    Allergies:   Allergies  Allergen Reactions  . Penicillins Nausea And Vomiting and Other (See Comments)    Has patient had a PCN reaction causing immediate rash, facial/tongue/throat swelling, SOB or lightheadedness with hypotension: No Has patient had a PCN reaction causing severe rash involving mucus membranes or skin necrosis: No Has patient had a PCN reaction that required hospitalization No Has patient had a PCN reaction occurring within the last 10 years: No If all of the above answers are "NO", then may proceed with Cephalosporin use      Labs:  Results for orders placed or performed during the hospital encounter of 07/23/18 (from the past 48 hour(s))  Comprehensive metabolic panel     Status: Abnormal   Collection Time: 07/23/18  7:52 PM  Result Value Ref Range   Sodium 142 135 - 145 mmol/L   Potassium 3.7 3.5 - 5.1 mmol/L   Chloride 110 98 - 111 mmol/L   CO2 22 22 - 32 mmol/L   Glucose, Bld 94 70 - 99 mg/dL   BUN 25 (H) 6 - 20 mg/dL   Creatinine, Ser 1.61 0.44 - 1.00 mg/dL   Calcium 8.8 (L) 8.9 - 10.3 mg/dL   Total Protein 6.8 6.5 -  8.1 g/dL   Albumin 3.6 3.5 - 5.0 g/dL   AST 28 15 - 41 U/L   ALT 21 0 - 44 U/L   Alkaline Phosphatase 44 38 - 126 U/L   Total Bilirubin 0.4 0.3 - 1.2 mg/dL   GFR calc non Af Amer >60 >60 mL/min   GFR calc Af Amer >60 >60 mL/min   Anion gap 10 5 - 15    Comment: Performed at [facility], [address] [place], [place] [address]  Ethanol     Status: None   Collection Time: 07/23/18  7:52 PM  Result Value Ref Range   Alcohol, Ethyl (B) <10 <10 mg/dL    Comment: (NOTE) Lowest detectable limit for serum alcohol is 10 mg/dL. For medical purposes only. Performed at [facility], [address] [place], [place] [address]   Salicylate level     Status: None   Collection Time: 07/23/18  7:52 PM  Result Value Ref Range   Salicylate Lvl <7.0 2.8 - 30.0 mg/dL    Comment: Performed at [facility], [address] [place], [place] [address]  Acetaminophen level     Status: Abnormal   Collection Time: 07/23/18  7:52 PM  Result Value Ref Range   Acetaminophen (Tylenol), Serum <10 (L) 10 - 30 ug/mL    Comment: (NOTE) Therapeutic concentrations vary significantly. A range of 10-30 ug/mL  may be an effective concentration for many patients. However, some  are best treated at concentrations outside of this range. Acetaminophen concentrations >150 ug/mL at 4 hours after ingestion  and >50 ug/mL at 12 hours after ingestion are often associated with  toxic reactions. Performed at [facility], [address] [place], [place] [address]   cbc     Status: Abnormal   Collection Time: 07/23/18  7:52 PM  Result Value Ref Range   WBC 7.2 4.0 - 10.5 K/uL   RBC 3.83 (L) 3.87 - 5.11 MIL/uL   Hemoglobin 12.3 12.0 - 15.0 g/dL   HCT 62.1 30.8 - 65.7 %   MCV 99.7 80.0 - 100.0 fL  MCH 32.1 26.0 - 34.0 pg   MCHC 32.2 30.0 - 36.0 g/dL   RDW 09.8 11.9 - 14.7 %   Platelets 295 150 - 400 K/uL   nRBC 0.0 0.0 - 0.2 %    Comment: Performed at [facility],  [address] [place], [place] [address]    No current facility-administered medications for this encounter.    Current Outpatient Medications  Medication Sig Dispense Refill  . albuterol (PROVENTIL HFA;VENTOLIN HFA) 108 (90 Base) MCG/ACT inhaler Inhale 2 puffs into the lungs every 4 (four) hours as needed for wheezing or shortness of breath. (Patient not taking: Reported on 06/25/2018) 1 Inhaler 0  . aspirin EC 81 MG tablet Take 1 tablet (81 mg total) by mouth daily. 30 tablet 1  . clobetasol cream (TEMOVATE) 0.05 % Apply 1 application topically 2 (two) times daily. 30 g 0  . clonazePAM (KLONOPIN) 0.5 MG tablet Take 1 tablet (0.5 mg total) by mouth 2 (two) times daily. 60 tablet 1  . divalproex (DEPAKOTE) 500 MG DR tablet Take 1 tablet (500 mg total) by mouth 2 (two) times daily. 60 tablet 1  . escitalopram (LEXAPRO) 20 MG tablet Take 1 tablet (20 mg total) by mouth daily. 30 tablet 1  . famotidine (PEPCID) 20 MG tablet Take 1 tablet (20 mg total) by mouth 2 (two) times daily. 30 tablet 0  . fenofibrate 160 MG tablet Take 1 tablet (160 mg total) by mouth daily. 30 tablet 1  . hydrOXYzine (ATARAX/VISTARIL) 25 MG tablet Take 1 tablet (25 mg total) by mouth every 6 (six) hours as needed for anxiety. (Patient taking differently: Take 25 mg by mouth 2 (two) times daily. ) 12 tablet 0  . levothyroxine (SYNTHROID, LEVOTHROID) 50 MCG tablet Take 1 tablet (50 mcg total) by mouth daily before breakfast. 30 tablet 1  . oxybutynin (DITROPAN-XL) 5 MG 24 hr tablet Take 1 tablet (5 mg total) by mouth daily. 30 tablet 1  . pantoprazole (PROTONIX) 40 MG tablet Take 1 tablet (40 mg total) by mouth daily. 30 tablet 1  . risperiDONE (RISPERDAL) 3 MG tablet Take 1 tablet (3 mg total) by mouth 2 (two) times daily. 60 tablet 1  . sucralfate (CARAFATE) 1 g tablet Take 1 tablet (1 g total) by mouth 4 (four) times daily -  with meals and at bedtime. (Patient taking differently: Take 1 g by mouth daily. ) 120 tablet 1     Musculoskeletal: Strength & Muscle Tone: within normal limits Gait & Station: normal Patient leans: N/A  Psychiatric Specialty Exam: Physical Exam  Nursing note and vitals reviewed. Constitutional: She is oriented to person, place, and time. She appears well-developed and well-nourished.  HENT:  Head: Normocephalic and atraumatic.  Eyes: Pupils are equal, round, and reactive to light. Conjunctivae and EOM are normal.  Neck: Normal range of motion. Neck supple.  Cardiovascular: Normal rate and regular rhythm.  Respiratory: Effort normal and breath sounds normal.  Musculoskeletal: Normal range of motion.  Neurological: She is alert and oriented to person, place, and time. She has normal reflexes.  Skin: Skin is warm and dry.    Review of Systems  Psychiatric/Behavioral: Positive for depression and suicidal ideas. Negative for hallucinations, memory loss and substance abuse. The patient is nervous/anxious. The patient does not have insomnia.   All other systems reviewed and are negative.   Blood pressure 124/81, pulse 83, temperature 98.6 F (37 C), temperature source Oral, resp. rate 20, height  (1.6 m),  weight 95 kg, last menstrual period 05/28/2018.Body mass index is 37.1 kg/m.  General Appearance: Fairly Groomed  Eye Contact:  Fair  Speech:  Garbled and Slow  Volume:  Decreased  Mood:  Depressed  Affect:  Depressed and Flat  Thought Process:  Coherent  Orientation:  Full (Time, Place, and Person)  Thought Content:  Coherent  Suicidal Thoughts:  Yes.  without intent/plan  Homicidal Thoughts:  Yes.  without intent/plan  Memory:  Recent;   Good  Judgement:  Poor  Insight:  Lacking  Psychomotor Activity:  Normal  Concentration:  Concentration: Good  Recall:  Good  Fund of Knowledge:  Fair  Language:  Good  Akathisia:  NA  Handed:  Right  AIMS (if indicated):     Assets:  Desire for Improvement Housing Social Support  ADL's:  Intact  Cognition:  Impaired,   Mild  Sleep:   Well     Treatment Plan Summary: Patient will remain in the ED overnight and group home staff will pick her up between the hours of 12:30 PM to 1 PM   Disposition: No evidence of imminent risk to self or others at present.   Patient does not meet criteria for psychiatric inpatient admission. Supportive therapy provided about ongoing stressors.  Catalina Gravel, NP 07/23/2018 10:34 PM

## 2018-07-23 NOTE — ED Notes (Signed)
Hourly rounding reveals patient sleeping in room. No complaints, stable, in no acute distress. Q15 minute rounds and monitoring via Rover and Officer to continue.  

## 2018-07-23 NOTE — ED Notes (Signed)
Triage completed by Andria Meuse, RN.

## 2018-07-23 NOTE — ED Notes (Signed)
Patient belongings: white bra, green shirt, green pants, white socks, white sneakers, purse. All belongings placed in bag to be put in locker.

## 2018-07-23 NOTE — Discharge Instructions (Addendum)
You have been seen in the emergency department for a  psychiatric concern. You have been evaluated both medically as well as psychiatrically. Please follow-up with your outpatient resources provided. Return to the emergency department for any worsening symptoms, or any thoughts of hurting yourself or anyone else so that we may attempt to help you. 
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
## 2018-07-23 NOTE — ED Provider Notes (Signed)
[facility] Emergency Department Provider Note  Time seen: 8:00 PM  I have reviewed the triage vital signs and the nursing notes.   HISTORY  Chief Complaint Suicidal   HPI [name] is a 53 y.o. female with a past medical history of anxiety, asthma, cerebral palsy, COPD, cognitive impairment, schizoaffective who presents to the emergency department with suicidal ideation.  According to the patient ever since moving into her group home she has felt suicidal because she does not want to live at the group home per patient.  Patient is also covered in a rash that was recently diagnosed as poison ivy, using a steroid cream but states it is no longer itching or bothering her.  Patient denies any medical complaints today.  No cough congestion or fever.  Denies any recent travel.   Past Medical History:  Diagnosis Date  . Anxiety   . Asthma   . Borderline personality disorder (HCC)   . Cerebral palsy (HCC)   . COPD (chronic obstructive pulmonary disease) (HCC)   . Depression   . GERD (gastroesophageal reflux disease)   . Mild cognitive impairment   . Schizoaffective disorder (HCC)   . Wound abscess     Patient Active Problem List   Diagnosis Date Noted  . Rash 07/22/2018  . 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 Overdose by acetaminophen 08/08/2016  . Schizoaffective disorder, bipolar type (HCC)   . 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 GERD (gastroesophageal reflux disease) 09/24/2014  . COPD (chronic obstructive pulmonary disease) (HCC) 09/24/2014  . Borderline personality disorder (HCC) 09/24/2014  . Cerebral palsy (HCC) 08/31/2014    Past Surgical History:  Procedure Laterality Date  . CHOLECYSTECTOMY    . INCISION AND DRAINAGE ABSCESS Right 01/02/2015   Procedure: INCISION AND DRAINAGE ABSCESS;  Surgeon: Tiney Rougealph Ely III, MD;  Location: [facility] ORS;  Service: General;  Laterality: Right;  .  TONSILLECTOMY      Prior to Admission medications   Medication Sig Start Date End Date Taking? Authorizing Provider  albuterol (PROVENTIL HFA;VENTOLIN HFA) 108 (90 Base) MCG/ACT inhaler Inhale 2 puffs into the lungs every 4 (four) hours as needed for wheezing or shortness of breath. Patient not taking: Reported on 06/25/2018 06/17/18   Charm RingsLord, Jamison Y, NP  aspirin EC 81 MG tablet Take 1 tablet (81 mg total) by mouth daily. 12/14/17   Clapacs, Jackquline DenmarkJohn T, MD  clobetasol cream (TEMOVATE) 0.05 % Apply 1 application topically 2 (two) times daily. 07/22/18   Phineas SemenGoodman, Graydon, MD  clonazePAM (KLONOPIN) 0.5 MG tablet Take 1 tablet (0.5 mg total) by mouth 2 (two) times daily. 12/14/17   Clapacs, Jackquline DenmarkJohn T, MD  divalproex (DEPAKOTE) 500 MG DR tablet Take 1 tablet (500 mg total) by mouth 2 (two) times daily. 12/14/17   Clapacs, Jackquline DenmarkJohn T, MD  escitalopram (LEXAPRO) 20 MG tablet Take 1 tablet (20 mg total) by mouth daily. 12/14/17   Clapacs, Jackquline DenmarkJohn T, MD  famotidine (PEPCID) 20 MG tablet Take 1 tablet (20 mg total) by mouth 2 (two) times daily. 06/11/18   Khatri, Hina, PA-C  fenofibrate 160 MG tablet Take 1 tablet (160 mg total) by mouth daily. 12/14/17   Clapacs, Jackquline DenmarkJohn T, MD  hydrOXYzine (ATARAX/VISTARIL) 25 MG tablet Take 1 tablet (25 mg total) by mouth every 6 (six) hours as needed for anxiety. Patient taking differently: Take 25 mg by mouth 2 (two) times daily.  05/11/18   Fayrene Helperran, Bowie, PA-C  levothyroxine (SYNTHROID, LEVOTHROID)  50 MCG tablet Take 1 tablet (50 mcg total) by mouth daily before breakfast. 12/14/17   Clapacs, Jackquline Denmark, MD  oxybutynin (DITROPAN-XL) 5 MG 24 hr tablet Take 1 tablet (5 mg total) by mouth daily. 12/14/17   Clapacs, Jackquline Denmark, MD  pantoprazole (PROTONIX) 40 MG tablet Take 1 tablet (40 mg total) by mouth daily. 12/14/17   Clapacs, Jackquline Denmark, MD  risperiDONE (RISPERDAL) 3 MG tablet Take 1 tablet (3 mg total) by mouth 2 (two) times daily. 12/14/17   Clapacs, Jackquline Denmark, MD  sucralfate (CARAFATE) 1 g tablet Take 1 tablet  (1 g total) by mouth 4 (four) times daily -  with meals and at bedtime. Patient taking differently: Take 1 g by mouth daily.  12/14/17   Clapacs, Jackquline Denmark, MD    Allergies  Allergen Reactions  . Penicillins Nausea And Vomiting and Other (See Comments)    Has patient had a PCN reaction causing immediate rash, facial/tongue/throat swelling, SOB or lightheadedness with hypotension: No Has patient had a PCN reaction causing severe rash involving mucus membranes or skin necrosis: No Has patient had a PCN reaction that required hospitalization No Has patient had a PCN reaction occurring within the last 10 years: No If all of the above answers are "NO", then may proceed with Cephalosporin use      Family History  Problem Relation Age of Onset  . Heart attack Father   . Diabetes Mother     Social History Social History   Tobacco Use  . Smoking status: Former Games developer  . Smokeless tobacco: Never Used  Substance Use Topics  . Alcohol use: No  . Drug use: No    Review of Systems Constitutional: Negative for fever. Cardiovascular: Negative for chest pain. Respiratory: Negative for shortness of breath. Gastrointestinal: Negative for abdominal pain Musculoskeletal: Negative for musculoskeletal complaints Skin: Negative for skin complaints  Neurological: Negative for headache All other ROS negative  ____________________________________________   PHYSICAL EXAM:  VITAL SIGNS: ED Triage Vitals [07/23/18 1936]  Enc Vitals Group     BP 124/81     Pulse Rate 83     Resp 20     Temp 98.6 F (37 C)     Temp Source Oral     SpO2      Weight 209 lb 7 oz (95 kg)     Height  (1.6 m)     Head Circumference      Peak Flow      Pain Score 0     Pain Loc      Pain Edu?      Excl. in GC?    Constitutional: Well appearing and in no distress. Eyes: Normal exam ENT   Head: Normocephalic and atraumatic.   Mouth/Throat: Mucous membranes are moist. Cardiovascular: Normal rate,  regular rhythm.  Respiratory: Normal respiratory effort without tachypnea nor retractions. Breath sounds are clear  Gastrointestinal: Soft and nontender. No distention.  Musculoskeletal: Nontender with normal range of motion in all extremities.  Neurologic:  Normal speech and language. No gross focal neurologic deficits  Skin: Skin is warm, dry she does have a maculopapular rash over extremities and neck with does appear consistent with a contact dermatitis, as previously diagnosed. Psychiatric: States suicidal ideation.  ____________________________________________   INITIAL IMPRESSION / ASSESSMENT AND PLAN / ED COURSE  Pertinent labs & imaging results that were available during my care of the patient were reviewed by me and considered in my medical decision making (see chart  for details).  Patient who is well-known to the emergency department for similar visits presents to the emergency department for suicidal ideation because she dislikes her group home per patient.  Patient has cognitive impairment.  We will have psychiatry see the patient.  Patient agreeable to plan of care.  Overall patient appears well, anticipate likely discharge back to her group home.  Patient is asking for food and drink upon arrival.  She has been seen by psychiatry.  They have discussed patient with a group home.  Group home will pick up in the morning.  I believe this to be a reasonable plan of care.  Patient's medical work-up has been nonrevealing.  ____________________________________________   FINAL CLINICAL IMPRESSION(S) / ED DIAGNOSES  Suicidal ideation Mental disability    Minna Antis, MD 07/23/18 2214

## 2018-07-23 NOTE — ED Notes (Signed)
Pt. Transferred from Triage to room 23 after dressing out and screening for contraband. Pt. Oriented to [place] including Q15 minute rounds as well as Psychologist, counselling for their protection. Patient is alert and oriented, warm and dry in no acute distress. Patient states she has SI to take pills and HI towards some one she wont share and states she hears voices telling her to take pills. Also states she sees "people". Pt. Encouraged to let me know if needs arise.

## 2018-07-24 DIAGNOSIS — R4689 Other symptoms and signs involving appearance and behavior: Secondary | ICD-10-CM

## 2018-07-24 NOTE — ED Notes (Signed)
Pt. Transferred to [facility] from ED to room after screening for contraband. Report to include Situation, Background, Assessment and Recommendations from Gary RN. Pt. Oriented to unit including Q15 minute rounds as well as the security cameras for their protection. Patient is alert and oriented, warm and dry in no acute distress. Patient reported SI and  HI. Denied AVH. Pt. Encouraged to let me know if needs arise.  

## 2018-07-24 NOTE — ED Notes (Signed)
Pt had urinated while sleeping. Gave pt change of linens and scrubs. Cleaned bed while pt showers.

## 2018-07-24 NOTE — ED Notes (Signed)
Hourly rounding reveals patient sleeping in room. No complaints, stable, in no acute distress. Q15 minute rounds and monitoring via Rover and Officer to continue.  

## 2018-07-24 NOTE — ED Notes (Signed)
Hourly rounding reveals patient in room. No complaints, stable, in no acute distress. Q15 minute rounds and monitoring via Security Cameras to continue. 

## 2018-07-24 NOTE — ED Notes (Signed)
Pt was discharged and taken to lobby by [facility] where Vertell Limber, group home owner, was waiting in the car. Pt got in the car with Steward Drone. By the time this writer had cleaned the room, Ms. Torrain had called here and said pt was back at [facility]. Dispatched called to let us know that pt had called to ask sheriff to take her to [facility]. Consulting civil engineer notified. Security aware.

## 2018-07-24 NOTE — BH Assessment (Signed)
This Clinical research associate spoke to [employer] owner Steward Drone [place]: [phone]) who reports she spoke to a nurse last night and informed them she would be here at "noon" to p/u pt from ED.

## 2018-07-24 NOTE — BH Assessment (Addendum)
TTS attempted to call pt's group home owner, [name] [phone]), 10 consecutive calls - with no answer. Unable to leave a voicemail - "mailbox full".
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
## 2018-07-24 NOTE — ED Notes (Signed)
Spoke with group home owner, [name] [phone]), who now says she will pick patient up before 1700. [name] said pt will "be right back" to the ED and has been served her 30-day notice.

## 2018-07-26 ENCOUNTER — Encounter: Payer: Self-pay | Admitting: Emergency Medicine

## 2018-07-26 ENCOUNTER — Emergency Department
Admission: EM | Admit: 2018-07-26 | Discharge: 2018-07-26 | Disposition: A | Discharge status: Home / Self Care | Payer: Medicare Other | Attending: Emergency Medicine | Admitting: Emergency Medicine

## 2018-07-26 ENCOUNTER — Emergency Department
Admission: EM | Admit: 2018-07-26 | Discharge: 2018-07-26 | Disposition: A | Discharge status: Home / Self Care | Payer: Medicare Other | Source: Home / Self Care | Attending: Emergency Medicine | Admitting: Emergency Medicine

## 2018-07-26 ENCOUNTER — Other Ambulatory Visit: Payer: Self-pay

## 2018-07-26 DIAGNOSIS — Z87891 Personal history of nicotine dependence: Secondary | ICD-10-CM

## 2018-07-26 DIAGNOSIS — Z7982 Long term (current) use of aspirin: Secondary | ICD-10-CM | POA: Insufficient documentation

## 2018-07-26 DIAGNOSIS — F32A Depression, unspecified: Secondary | ICD-10-CM

## 2018-07-26 DIAGNOSIS — G809 Cerebral palsy, unspecified: Secondary | ICD-10-CM

## 2018-07-26 DIAGNOSIS — G3184 Mild cognitive impairment, so stated: Secondary | ICD-10-CM | POA: Insufficient documentation

## 2018-07-26 DIAGNOSIS — F70 Mild intellectual disabilities: Secondary | ICD-10-CM | POA: Diagnosis not present

## 2018-07-26 DIAGNOSIS — Z79899 Other long term (current) drug therapy: Secondary | ICD-10-CM | POA: Insufficient documentation

## 2018-07-26 DIAGNOSIS — J449 Chronic obstructive pulmonary disease, unspecified: Secondary | ICD-10-CM | POA: Diagnosis not present

## 2018-07-26 DIAGNOSIS — R45851 Suicidal ideations: Secondary | ICD-10-CM | POA: Insufficient documentation

## 2018-07-26 DIAGNOSIS — R4189 Other symptoms and signs involving cognitive functions and awareness: Secondary | ICD-10-CM

## 2018-07-26 DIAGNOSIS — F4325 Adjustment disorder with mixed disturbance of emotions and conduct: Secondary | ICD-10-CM | POA: Diagnosis present

## 2018-07-26 DIAGNOSIS — R45850 Homicidal ideations: Secondary | ICD-10-CM | POA: Diagnosis not present

## 2018-07-26 DIAGNOSIS — F259 Schizoaffective disorder, unspecified: Secondary | ICD-10-CM

## 2018-07-26 DIAGNOSIS — F329 Major depressive disorder, single episode, unspecified: Secondary | ICD-10-CM | POA: Diagnosis present

## 2018-07-26 LAB — COMPREHENSIVE METABOLIC PANEL
ALT: 23 U/L (ref 0–44)
AST: 27 U/L (ref 15–41)
Albumin: 3.9 g/dL (ref 3.5–5.0)
Alkaline Phosphatase: 55 U/L (ref 38–126)
Anion gap: 11 (ref 5–15)
BUN: 17 mg/dL (ref 6–20)
CO2: 27 mmol/L (ref 22–32)
Calcium: 9.1 mg/dL (ref 8.9–10.3)
Chloride: 104 mmol/L (ref 98–111)
Creatinine, Ser: 0.7 mg/dL (ref 0.44–1.00)
GFR calc Af Amer: >60 mL/min (ref >60)
GFR calc non Af Amer: >60 mL/min (ref >60)
Glucose, Bld: 131 mg/dL — ABNORMAL HIGH (ref 70–99)
Potassium: 3.5 mmol/L (ref 3.5–5.1)
Sodium: 142 mmol/L (ref 135–145)
Total Bilirubin: 0.8 mg/dL (ref 0.3–1.2)
Total Protein: 7.7 g/dL (ref 6.5–8.1)

## 2018-07-26 LAB — CBC
HCT: 39.5 % (ref 36.0–46.0)
Hemoglobin: 12.7 g/dL (ref 12.0–15.0)
MCH: 31.8 pg (ref 26.0–34.0)
MCHC: 32.2 g/dL (ref 30.0–36.0)
MCV: 99 fL (ref 80.0–100.0)
Platelets: 328 10*3/uL (ref 150–400)
RBC: 3.99 MIL/uL (ref 3.87–5.11)
RDW: 14.2 % (ref 11.5–15.5)
WBC: 8.3 10*3/uL (ref 4.0–10.5)
nRBC: 0 % (ref 0.0–0.2)

## 2018-07-26 LAB — URINE DRUG SCREEN, QUALITATIVE (ARMC ONLY)
Amphetamines, Ur Screen: NOT DETECTED
Barbiturates, Ur Screen: NOT DETECTED
Benzodiazepine, Ur Scrn: NOT DETECTED
Cannabinoid 50 Ng, Ur ~~LOC~~: NOT DETECTED
Cocaine Metabolite,Ur ~~LOC~~: NOT DETECTED
MDMA (Ecstasy)Ur Screen: NOT DETECTED
Methadone Scn, Ur: NOT DETECTED
Opiate, Ur Screen: NOT DETECTED
Phencyclidine (PCP) Ur S: NOT DETECTED
Tricyclic, Ur Screen: NOT DETECTED

## 2018-07-26 LAB — ACETAMINOPHEN LEVEL: Acetaminophen (Tylenol), Serum: <10 ug/mL — ABNORMAL LOW (ref 10–30)

## 2018-07-26 LAB — SALICYLATE LEVEL: Salicylate Lvl: <7 mg/dL (ref 2.8–30.0)

## 2018-07-26 LAB — ETHANOL: Alcohol, Ethyl (B): <10 mg/dL (ref <10)

## 2018-07-26 NOTE — ED Notes (Signed)
Nurse talked to patient and she states that she wants to walk into traffic and kill herself, states that she was angry with girl at the group home, she contracts for safety here, nurse will continue to monitor, q 15 minute checks for safety.

## 2018-07-26 NOTE — BH Assessment (Signed)
TTS attempted to reach [facility] owner at [phone] and informed her the patient is ready to be picked up.    Ms. Myra Rude immediate response was "Has [name] told y'all that she doesn't want to be here?"  She expressed frustration that the patient was recently picked up from another hospital and continuously takes actions such as walking in the street or calling EMS.  Ms. Su Monks is stating that [name] has made it clear to that she doesn't want to be at the facility.  Ms. Su Monks is demanding to speak with a social worker assigned and informed that there is not one.  Ms. Su Monks is informed that a psychiatric care provider has seen the patient and cleared her for discharge, thus the patient needs to be picked up.   Ms. Su Monks inquired what will happen when the patient's "notice is up in 5 days." TTS recommended that she work with the patient as well as the patient's care coordinator to secure an alternative placement.   Ms. Su Monks said she would be able to pick the patient up after she completes meal preparation for others in her care. She should arrive around 6pm.
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
## 2018-07-26 NOTE — ED Provider Notes (Signed)
[facility] Emergency Department Provider Note  Time seen: 7:00 PM  I have reviewed the triage vital signs and the nursing notes.   HISTORY  Chief Complaint Suicidal   HPI [name] is a 53 y.o. female with cognitive impairment, schizophrenia, cerebral palsy, anxiety, presents to the emergency department stating suicidal ideation.  I have just seen the patient approximately 1 hour ago in the emergency department, we released her when she tried to get out of the car, so police brought her back to the emergency department.  I had a long discussion with the patient, she states she does not want to go back to the group home because she does not get along with Steward Drone the group home owner.  The group home per [place] with whom I spoke on the phone is actively trying to get her into a new group home but they have not been successful as of yet.  I discussed this with the patient and they are actively trying to get her into a new group home and patient was much more receptive after this stating that she would be willing to go back home to the group home until they could place her to a new one.  She has no plan to kill herself per patient.   Past Medical History:  Diagnosis Date  . Anxiety   . Asthma   . Borderline personality disorder (HCC)   . Cerebral palsy (HCC)   . COPD (chronic obstructive pulmonary disease) (HCC)   . Depression   . GERD (gastroesophageal reflux disease)   . Mild cognitive impairment   . Schizoaffective disorder (HCC)   . Wound abscess     Patient Active Problem List   Diagnosis Date Noted  . Suicidal behavior without attempted self-injury 07/23/2018  . Rash 07/22/2018  . 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 Overdose by acetaminophen 08/08/2016  . Schizoaffective disorder, bipolar type (HCC)   . 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 GERD (gastroesophageal reflux disease) 09/24/2014  . COPD  (chronic obstructive pulmonary disease) (HCC) 09/24/2014  . Borderline personality disorder (HCC) 09/24/2014  . Cerebral palsy (HCC) 08/31/2014    Past Surgical History:  Procedure Laterality Date  . CHOLECYSTECTOMY    . INCISION AND DRAINAGE ABSCESS Right 01/02/2015   Procedure: INCISION AND DRAINAGE ABSCESS;  Surgeon: Tiney Rouge III, MD;  Location: [facility] ORS;  Service: General;  Laterality: Right;  . TONSILLECTOMY      Prior to Admission medications   Medication Sig Start Date End Date Taking? Authorizing Provider  albuterol (PROVENTIL HFA;VENTOLIN HFA) 108 (90 Base) MCG/ACT inhaler Inhale 2 puffs into the lungs every 4 (four) hours as needed for wheezing or shortness of breath. Patient not taking: Reported on 06/25/2018 06/17/18   Charm Rings, NP  aspirin EC 81 MG tablet Take 1 tablet (81 mg total) by mouth daily. Patient not taking: Reported on 07/26/2018 12/14/17   Clapacs, Jackquline Denmark, MD  clobetasol cream (TEMOVATE) 0.05 % Apply 1 application topically 2 (two) times daily. Patient not taking: Reported on 07/26/2018 07/22/18   Phineas Semen, MD  clonazePAM (KLONOPIN) 0.5 MG tablet Take 1 tablet (0.5 mg total) by mouth 2 (two) times daily. 12/14/17   Clapacs, Jackquline Denmark, MD  divalproex (DEPAKOTE) 500 MG DR tablet Take 1 tablet (500 mg total) by mouth 2 (two) times daily. 12/14/17   Clapacs, Jackquline Denmark, MD  escitalopram (LEXAPRO) 20 MG tablet Take 1 tablet (20 mg total)  by mouth daily. 12/14/17   Clapacs, Jackquline Denmark, MD  famotidine (PEPCID) 20 MG tablet Take 1 tablet (20 mg total) by mouth 2 (two) times daily. 06/11/18   Khatri, Hina, PA-C  fenofibrate 160 MG tablet Take 1 tablet (160 mg total) by mouth daily. 12/14/17   Clapacs, Jackquline Denmark, MD  hydrOXYzine (ATARAX/VISTARIL) 25 MG tablet Take 1 tablet (25 mg total) by mouth every 6 (six) hours as needed for anxiety. Patient taking differently: Take 25 mg by mouth 3 (three) times daily.  05/11/18   Fayrene Helper, PA-C  levothyroxine (SYNTHROID, LEVOTHROID) 50 MCG  tablet Take 1 tablet (50 mcg total) by mouth daily before breakfast. 12/14/17   Clapacs, Jackquline Denmark, MD  oxybutynin (DITROPAN-XL) 5 MG 24 hr tablet Take 1 tablet (5 mg total) by mouth daily. 12/14/17   Clapacs, Jackquline Denmark, MD  pantoprazole (PROTONIX) 40 MG tablet Take 1 tablet (40 mg total) by mouth daily. 12/14/17   Clapacs, Jackquline Denmark, MD  risperiDONE (RISPERDAL) 3 MG tablet Take 1 tablet (3 mg total) by mouth 2 (two) times daily. 12/14/17   Clapacs, Jackquline Denmark, MD  sucralfate (CARAFATE) 1 g tablet Take 1 tablet (1 g total) by mouth 4 (four) times daily -  with meals and at bedtime. Patient taking differently: Take 1 g by mouth 3 (three) times daily.  12/14/17   Clapacs, Jackquline Denmark, MD    Allergies  Allergen Reactions  . Penicillins Nausea And Vomiting and Other (See Comments)    Has patient had a PCN reaction causing immediate rash, facial/tongue/throat swelling, SOB or lightheadedness with hypotension: No Has patient had a PCN reaction causing severe rash involving mucus membranes or skin necrosis: No Has patient had a PCN reaction that required hospitalization No Has patient had a PCN reaction occurring within the last 10 years: No If all of the above answers are "NO", then may proceed with Cephalosporin use      Family History  Problem Relation Age of Onset  . Heart attack Father   . Diabetes Mother     Social History Social History   Tobacco Use  . Smoking status: Former Games developer  . Smokeless tobacco: Never Used  Substance Use Topics  . Alcohol use: No  . Drug use: No    Review of Systems Constitutional: Negative for fever. Cardiovascular: Negative for chest pain. Respiratory: Negative for shortness of breath.  Negative for cough. Gastrointestinal: Negative for abdominal pain Neurological: Negative for headache All other ROS negative  ____________________________________________   PHYSICAL EXAM:  VITAL SIGNS: ED Triage Vitals [07/26/18 1846]  Enc Vitals Group     BP 139/66     Pulse  Rate 82     Resp 16     Temp 97.9 F (36.6 C)     Temp src      SpO2 95 %     Weight      Height      Head Circumference      Peak Flow      Pain Score 0     Pain Loc      Pain Edu?      Excl. in GC?     Constitutional: Awake and alert, no distress. Eyes: Normal exam ENT      Head: Normocephalic and atraumatic.      Mouth/Throat: Mucous membranes are moist. Musculoskeletal: Ambulates without difficulty Neurologic:  Normal speech and language.  Moves all extremities Skin:  Skin is warm, dry and intact.  Psychiatric: Mood  and affect are normal.   ____________________________________________   INITIAL IMPRESSION / ASSESSMENT AND PLAN / ED COURSE  Pertinent labs & imaging results that were available during my care of the patient were reviewed by me and considered in my medical decision making (see chart for details).   Patient presents emergency department for repeat evaluation, saying that she cannot go back to her group home.  Had a long discussion with the patient, they are actively attempting to place her into a new group home, patient ultimately is agreeable to go back to her group home.  She does not appear to have an active plan to kill her self.  Patient is well-known to myself and other staff members for similar presentations.  I discussed the patient with Steward DroneBrenda the group home director/owner.  Steward DroneBrenda states she is actively trying to place the patient into a new group home but they have not been successful as of yet.  States that she does not feel that this is fair that she has to keep picking the patient up from various hospitals.  However I discussed with Steward DroneBrenda that during a pandemic we are not able to board the patient in the emergency department without a medical or psychiatric cause warranting her staying here.  Steward DroneBrenda then hung the phone up.  We will attempt to discharge back to her group facility.  Patient agreeable to plan of care.  Tempie DonningKimberly Labat was evaluated in  Emergency Department on 07/26/2018 for the symptoms described in the history of present illness. She was evaluated in the context of the global COVID-19 pandemic, which necessitated consideration that the patient might be at risk for infection with the SARS-CoV-2 virus that causes COVID-19. Institutional protocols and algorithms that pertain to the evaluation of patients at risk for COVID-19 are in a state of rapid change based on information released by regulatory bodies including the CDC and federal and state organizations. These policies and algorithms were followed during the patient's care in the ED.  ____________________________________________   FINAL CLINICAL IMPRESSION(S) / ED DIAGNOSES  Cognitive impairment Agitation   Minna AntisPaduchowski, Kesa Birky, MD 07/26/18 1905

## 2018-07-26 NOTE — ED Notes (Signed)
Patient and caregiver voices understanding of discharge instructions, all Patient's belongings given back to Patient, Patient transferred via car back to group home.

## 2018-07-26 NOTE — ED Notes (Signed)
Patient transferred back to bed 19h, with police officer and officer states that Patient was in the road, saying she was going to walk into traffic, Patient states ' I am mental and need help" Dr. Talked with patient and Patient made aware that she will be moving to another group home in the near future, and Patient became calm and states that she could go back and stay at her group home a few more days, will continue to monitor. Patient is safe at this time.

## 2018-07-26 NOTE — ED Notes (Signed)
Voluntary consult complete to go to group home

## 2018-07-26 NOTE — BH Assessment (Signed)
TTS attempted to reach [facility] owner at [phone] and was unable to do so.  The voicemail box was full, thus, TTS was unable to leave a message.

## 2018-07-26 NOTE — ED Notes (Signed)
Patient ate 100% of supper and beverage, no signs of distress.  

## 2018-07-26 NOTE — ED Provider Notes (Addendum)
[facility] Emergency Department Provider Note  Time seen: 3:59 PM  I have reviewed the triage vital signs and the nursing notes.   HISTORY  Chief Complaint Homicidal and Suicidal   HPI [name] is a 53 y.o. female with a past medical history of anxiety, cerebral palsy, COPD, cognitive impairment, schizophrenia, presents to the emergency department for suicidal ideation.  According to the patient she got into an argument with Steward Drone at her group home today and now is having thoughts of killing herself because she does not want to be at the group home.  Patient is well-known to the emergency department for similar presentations whenever she gets upset at her group home.  Patient has no medical complaints today, has poison ivy but states that is much improved and no longer bothering her.   Past Medical History:  Diagnosis Date  . Anxiety   . Asthma   . Borderline personality disorder (HCC)   . Cerebral palsy (HCC)   . COPD (chronic obstructive pulmonary disease) (HCC)   . Depression   . GERD (gastroesophageal reflux disease)   . Mild cognitive impairment   . Schizoaffective disorder (HCC)   . Wound abscess     Patient Active Problem List   Diagnosis Date Noted  . Suicidal behavior without attempted self-injury 07/23/2018  . Rash 07/22/2018  . 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 Overdose by acetaminophen 08/08/2016  . Schizoaffective disorder, bipolar type (HCC)   . 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 GERD (gastroesophageal reflux disease) 09/24/2014  . COPD (chronic obstructive pulmonary disease) (HCC) 09/24/2014  . Borderline personality disorder (HCC) 09/24/2014  . Cerebral palsy (HCC) 08/31/2014    Past Surgical History:  Procedure Laterality Date  . CHOLECYSTECTOMY    . INCISION AND DRAINAGE ABSCESS Right 01/02/2015   Procedure: INCISION AND DRAINAGE ABSCESS;  Surgeon: Tiney Rouge III,  MD;  Location: [facility] ORS;  Service: General;  Laterality: Right;  . TONSILLECTOMY      Prior to Admission medications   Medication Sig Start Date End Date Taking? Authorizing Provider  clonazePAM (KLONOPIN) 0.5 MG tablet Take 1 tablet (0.5 mg total) by mouth 2 (two) times daily. 12/14/17  Yes Clapacs, Jackquline Denmark, MD  divalproex (DEPAKOTE) 500 MG DR tablet Take 1 tablet (500 mg total) by mouth 2 (two) times daily. 12/14/17  Yes Clapacs, Jackquline Denmark, MD  escitalopram (LEXAPRO) 20 MG tablet Take 1 tablet (20 mg total) by mouth daily. 12/14/17  Yes Clapacs, Jackquline Denmark, MD  famotidine (PEPCID) 20 MG tablet Take 1 tablet (20 mg total) by mouth 2 (two) times daily. 06/11/18  Yes Khatri, Hina, PA-C  fenofibrate 160 MG tablet Take 1 tablet (160 mg total) by mouth daily. 12/14/17  Yes Clapacs, Jackquline Denmark, MD  hydrOXYzine (ATARAX/VISTARIL) 25 MG tablet Take 1 tablet (25 mg total) by mouth every 6 (six) hours as needed for anxiety. Patient taking differently: Take 25 mg by mouth 3 (three) times daily.  05/11/18  Yes Fayrene Helper, PA-C  levothyroxine (SYNTHROID, LEVOTHROID) 50 MCG tablet Take 1 tablet (50 mcg total) by mouth daily before breakfast. 12/14/17  Yes Clapacs, Jackquline Denmark, MD  oxybutynin (DITROPAN-XL) 5 MG 24 hr tablet Take 1 tablet (5 mg total) by mouth daily. 12/14/17  Yes Clapacs, Jackquline Denmark, MD  pantoprazole (PROTONIX) 40 MG tablet Take 1 tablet (40 mg total) by mouth daily. 12/14/17  Yes Clapacs, Jackquline Denmark, MD  risperiDONE (RISPERDAL) 3 MG tablet Take 1  tablet (3 mg total) by mouth 2 (two) times daily. 12/14/17  Yes Clapacs, Jackquline Denmark, MD  sucralfate (CARAFATE) 1 g tablet Take 1 tablet (1 g total) by mouth 4 (four) times daily -  with meals and at bedtime. Patient taking differently: Take 1 g by mouth 3 (three) times daily.  12/14/17  Yes Clapacs, Jackquline Denmark, MD  albuterol (PROVENTIL HFA;VENTOLIN HFA) 108 (90 Base) MCG/ACT inhaler Inhale 2 puffs into the lungs every 4 (four) hours as needed for wheezing or shortness of breath. Patient not  taking: Reported on 06/25/2018 06/17/18   Charm Rings, NP  aspirin EC 81 MG tablet Take 1 tablet (81 mg total) by mouth daily. Patient not taking: Reported on 07/26/2018 12/14/17   Clapacs, Jackquline Denmark, MD  clobetasol cream (TEMOVATE) 0.05 % Apply 1 application topically 2 (two) times daily. Patient not taking: Reported on 07/26/2018 07/22/18   Phineas Semen, MD    Allergies  Allergen Reactions  . Penicillins Nausea And Vomiting and Other (See Comments)    Has patient had a PCN reaction causing immediate rash, facial/tongue/throat swelling, SOB or lightheadedness with hypotension: No Has patient had a PCN reaction causing severe rash involving mucus membranes or skin necrosis: No Has patient had a PCN reaction that required hospitalization No Has patient had a PCN reaction occurring within the last 10 years: No If all of the above answers are "NO", then may proceed with Cephalosporin use      Family History  Problem Relation Age of Onset  . Heart attack Father   . Diabetes Mother     Social History Social History   Tobacco Use  . Smoking status: Former Games developer  . Smokeless tobacco: Never Used  Substance Use Topics  . Alcohol use: No  . Drug use: No    Review of Systems Constitutional: Negative for fever Cardiovascular: Negative for chest pain. Respiratory: Negative for shortness of breath. Gastrointestinal: Negative for abdominal pain Musculoskeletal: Negative for musculoskeletal complaints Neurological: Negative for headache All other ROS negative  ____________________________________________   PHYSICAL EXAM:  VITAL SIGNS: ED Triage Vitals  Enc Vitals Group     BP 07/26/18 1434 129/85     Pulse Rate 07/26/18 1434 79     Resp 07/26/18 1434 16     Temp 07/26/18 1438 (!) 97.4 F (36.3 C)     Temp Source 07/26/18 1438 Oral     SpO2 07/26/18 1434 97 %     Weight 07/26/18 1427 209 lb 7 oz (95 kg)     Height 07/26/18 1427 5\' 3"  (1.6 m)     Head Circumference --       Peak Flow --      Pain Score 07/26/18 1426 0     Pain Loc --      Pain Edu? --      Excl. in GC? --    Constitutional: Alert. Well appearing and in no distress. Eyes: Normal exam ENT   Head: Normocephalic and atraumatic.   Mouth/Throat: Mucous membranes are moist. Cardiovascular: Normal rate, regular rhythm.  Respiratory: Normal respiratory effort without tachypnea nor retractions. Breath sounds are clear  Gastrointestinal: Soft and nontender. No distention. Musculoskeletal: Nontender with normal range of motion in all extremities.  Neurologic:  Normal speech and language. No gross focal neurologic deficits Skin:  Skin is warm, dry and intact.  Psychiatric: States suicidal ideation.  ____________________________________________   INITIAL IMPRESSION / ASSESSMENT AND PLAN / ED COURSE  Pertinent labs & imaging results  that were available during my care of the patient were reviewed by me and considered in my medical decision making (see chart for details).  Patient presents to the emergency department stating suicidal ideation after getting into an argument with the group home owner.  Patient is well-known to myself as well as the emergency department staff for similar presentations.  Patient does have mild cognitive impairment which I believe contributes to her ED presentations.  Patient has no plan on how she would hurt herself.  We will have psychiatry TTS see the patient to evaluate psychiatrically.  Patient's medical work-up is nonrevealing.   Psychiatry has evaluated.  Group home is here to pick up the patient.  I believe the patient safe for discharge home to her group facility at this time.   Tempie DonningKimberly Syfert was evaluated in Emergency Department on 07/26/2018 for the symptoms described in the history of present illness. She was evaluated in the context of the global COVID-19 pandemic, which necessitated consideration that the patient might be at risk for infection with the  SARS-CoV-2 virus that causes COVID-19. Institutional protocols and algorithms that pertain to the evaluation of patients at risk for COVID-19 are in a state of rapid change based on information released by regulatory bodies including the CDC and federal and state organizations. These policies and algorithms were followed during the patient's care in the ED.  ____________________________________________   FINAL CLINICAL IMPRESSION(S) / ED DIAGNOSES  Depression    Minna AntisPaduchowski, Norissa Bartee, MD 07/26/18 1601    Minna AntisPaduchowski, Tangelia Sanson, MD 07/26/18 1743
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
## 2018-07-26 NOTE — Discharge Instructions (Signed)
You have been seen in the emergency department for a  psychiatric concern. You have been evaluated both medically as well as psychiatrically. Please follow-up with your outpatient resources provided. Return to the emergency department for any worsening symptoms, or any thoughts of hurting yourself or anyone else so that we may attempt to help you. 

## 2018-07-26 NOTE — Progress Notes (Signed)
Patient is well known to this provider for similar presentations and multiple ED visits.  Caveat:  She has cognitive impairment with a low threshold for frustration.  Today, she got upset with another woman in her group home and does not want to return.  No suicidal/homicidal ideations, hallucinations, or substance abuse.  She use to come to the ED because she did not like living with her family, now she does not like the group home because it is "too strict."  This provider let her know she would have to talk to her care manager at [facility] to facilitate a move to a different place.  Stable psychiatrically to return to her group home.  Nanine Means, PMHNP

## 2018-07-28 ENCOUNTER — Emergency Department
Admission: EM | Admit: 2018-07-28 | Discharge: 2018-07-29 | Disposition: A | Discharge status: Home / Self Care | Payer: Medicare Other | Attending: Emergency Medicine | Admitting: Emergency Medicine

## 2018-07-28 ENCOUNTER — Other Ambulatory Visit: Payer: Self-pay

## 2018-07-28 ENCOUNTER — Encounter: Payer: Self-pay | Admitting: Emergency Medicine

## 2018-07-28 DIAGNOSIS — F603 Borderline personality disorder: Secondary | ICD-10-CM | POA: Diagnosis present

## 2018-07-28 DIAGNOSIS — Z79899 Other long term (current) drug therapy: Secondary | ICD-10-CM | POA: Diagnosis not present

## 2018-07-28 DIAGNOSIS — F4325 Adjustment disorder with mixed disturbance of emotions and conduct: Secondary | ICD-10-CM | POA: Diagnosis not present

## 2018-07-28 DIAGNOSIS — Z87891 Personal history of nicotine dependence: Secondary | ICD-10-CM | POA: Insufficient documentation

## 2018-07-28 DIAGNOSIS — R4689 Other symptoms and signs involving appearance and behavior: Secondary | ICD-10-CM

## 2018-07-28 DIAGNOSIS — R45851 Suicidal ideations: Secondary | ICD-10-CM

## 2018-07-28 DIAGNOSIS — F259 Schizoaffective disorder, unspecified: Secondary | ICD-10-CM | POA: Diagnosis present

## 2018-07-28 DIAGNOSIS — J45909 Unspecified asthma, uncomplicated: Secondary | ICD-10-CM | POA: Diagnosis not present

## 2018-07-28 DIAGNOSIS — R456 Violent behavior: Secondary | ICD-10-CM | POA: Diagnosis not present

## 2018-07-28 DIAGNOSIS — F250 Schizoaffective disorder, bipolar type: Secondary | ICD-10-CM | POA: Diagnosis present

## 2018-07-28 LAB — CBC
HCT: 38.1 % (ref 36.0–46.0)
Hemoglobin: 12.4 g/dL (ref 12.0–15.0)
MCH: 32.2 pg (ref 26.0–34.0)
MCHC: 32.5 g/dL (ref 30.0–36.0)
MCV: 99 fL (ref 80.0–100.0)
Platelets: 367 10*3/uL (ref 150–400)
RBC: 3.85 MIL/uL — ABNORMAL LOW (ref 3.87–5.11)
RDW: 14.6 % (ref 11.5–15.5)
WBC: 10.6 10*3/uL — ABNORMAL HIGH (ref 4.0–10.5)
nRBC: 0 % (ref 0.0–0.2)

## 2018-07-28 LAB — SALICYLATE LEVEL: Salicylate Lvl: <7 mg/dL (ref 2.8–30.0)

## 2018-07-28 LAB — COMPREHENSIVE METABOLIC PANEL
ALT: 21 U/L (ref 0–44)
AST: 24 U/L (ref 15–41)
Albumin: 3.5 g/dL (ref 3.5–5.0)
Alkaline Phosphatase: 49 U/L (ref 38–126)
Anion gap: 9 (ref 5–15)
BUN: 27 mg/dL — ABNORMAL HIGH (ref 6–20)
CO2: 26 mmol/L (ref 22–32)
Calcium: 9.2 mg/dL (ref 8.9–10.3)
Chloride: 109 mmol/L (ref 98–111)
Creatinine, Ser: 0.85 mg/dL (ref 0.44–1.00)
GFR calc Af Amer: >60 mL/min (ref >60)
GFR calc non Af Amer: >60 mL/min (ref >60)
Glucose, Bld: 104 mg/dL — ABNORMAL HIGH (ref 70–99)
Potassium: 4 mmol/L (ref 3.5–5.1)
Sodium: 144 mmol/L (ref 135–145)
Total Bilirubin: 0.4 mg/dL (ref 0.3–1.2)
Total Protein: 7.3 g/dL (ref 6.5–8.1)

## 2018-07-28 LAB — ETHANOL: Alcohol, Ethyl (B): <10 mg/dL (ref <10)

## 2018-07-28 LAB — ACETAMINOPHEN LEVEL: Acetaminophen (Tylenol), Serum: <10 ug/mL — ABNORMAL LOW (ref 10–30)

## 2018-07-28 NOTE — ED Provider Notes (Signed)
[facility] Emergency Department Provider Note   ____________________________________________     I have reviewed the triage vital signs and the nursing notes.   HISTORY  Chief Complaint Psychiatric Evaluation     HPI [name] is a 53 y.o. female well known to this ED presents with SI and HI, which is unfortunately typical for her. Recently discharged from ED for similar complaints. Patient states she told someone at her group home to give her a knife so that she could stab her in the chest and then stab herself.  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   Overdose by acetaminophen 08/08/2016   Schizoaffective disorder, bipolar type (HCC)    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 Authorizing Provider  albuterol (PROVENTIL HFA;VENTOLIN HFA)  108 (90 Base) MCG/ACT inhaler Inhale 2 puffs into the lungs every 4 (four) hours as needed for wheezing or shortness of breath. Patient not taking: Reported on 06/25/2018 06/17/18   Charm RingsLord, Jamison Y, NP  aspirin EC 81 MG tablet Take 1 tablet (81 mg total) by mouth daily. Patient not taking: Reported on 07/26/2018 12/14/17   Clapacs, Jackquline DenmarkJohn T, MD  clobetasol cream (TEMOVATE) 0.05 % Apply 1 application topically 2 (two) times daily. Patient not taking: Reported on 07/26/2018 07/22/18   Phineas SemenGoodman, Graydon, MD  clonazePAM (KLONOPIN) 0.5 MG tablet Take 1 tablet (0.5 mg total) by mouth 2 (two) times daily. 12/14/17   Clapacs, Jackquline DenmarkJohn T, MD  divalproex (DEPAKOTE) 500 MG DR tablet Take 1 tablet (500 mg total) by mouth 2 (two) times daily. 12/14/17   Clapacs, Jackquline DenmarkJohn T, MD  escitalopram (LEXAPRO) 20 MG tablet Take 1 tablet (20 mg total) by mouth daily. 12/14/17   Clapacs, Jackquline DenmarkJohn T, MD  famotidine (PEPCID) 20 MG tablet Take 1 tablet (20 mg total) by mouth 2 (two) times daily. 06/11/18   Khatri, Hina, PA-C  fenofibrate 160 MG tablet Take 1 tablet (160 mg total) by mouth daily. 12/14/17   Clapacs, Jackquline DenmarkJohn T, MD  hydrOXYzine (ATARAX/VISTARIL) 25 MG tablet Take 1 tablet (25 mg total) by mouth every 6 (six) hours as needed for anxiety. Patient taking differently: Take 25 mg by mouth 3 (three) times daily.  05/11/18   Fayrene Helperran, Bowie, PA-C  levothyroxine (SYNTHROID, LEVOTHROID) 50 MCG tablet Take 1 tablet (50 mcg total) by mouth daily before breakfast. 12/14/17  Clapacs, Jackquline Denmark, MD  oxybutynin (DITROPAN-XL) 5 MG 24 hr tablet Take 1 tablet (5 mg total) by mouth daily. 12/14/17   Clapacs, Jackquline Denmark, MD  pantoprazole (PROTONIX) 40 MG tablet Take 1 tablet (40 mg total) by mouth daily. 12/14/17   Clapacs, Jackquline Denmark, MD  risperiDONE (RISPERDAL) 3 MG tablet Take 1 tablet (3 mg total) by mouth 2 (two) times daily. 12/14/17   Clapacs, Jackquline Denmark, MD  sucralfate (CARAFATE) 1 g tablet Take 1 tablet (1 g total) by mouth 4 (four) times daily -  with meals and at  bedtime. Patient taking differently: Take 1 g by mouth 3 (three) times daily.  12/14/17   Clapacs, Jackquline Denmark, MD     Allergies Penicillins  Family History  Problem Relation Age of Onset   Heart attack Father    Diabetes Mother     Social History Social History   Tobacco Use   Smoking status: Former Smoker   Smokeless tobacco: Never Used  Substance Use Topics   Alcohol use: No   Drug use: No    Review of Systems  Constitutional: No chills Eyes: No visual changes.  ENT: No neck pain Cardiovascular: Denies chest pain. Respiratory: Denies shortness of breath. Gastrointestinal: No abdominal pain.  Genitourinary: Negative for dysuria. Musculoskeletal: Negative for back pain. Skin: Negative for laceration Neurological: Negative for headaches    ____________________________________________   PHYSICAL EXAM:  VITAL SIGNS: ED Triage Vitals  Enc Vitals Group     BP 07/28/18 1351 105/67     Pulse Rate 07/28/18 1351 99     Resp 07/28/18 1351 16     Temp 07/28/18 1351 97.8 F (36.6 C)     Temp Source 07/28/18 1351 Oral     SpO2 07/28/18 1351 93 %     Weight 07/28/18 1347 95 kg (209 lb 7 oz)     Height 07/28/18 1347 1.6 m (5\' 3" )     Head Circumference --      Peak Flow --      Pain Score 07/28/18 1346 0     Pain Loc --      Pain Edu? --      Excl. in GC? --     Constitutional: Alert and oriented.  Eyes: Conjunctivae are normal.   Nose: No congestion/rhinnorhea.  Cardiovascular: Normal rate, regular rhythm. Grossly normal heart sounds.  Good peripheral circulation. Respiratory: Normal respiratory effort.  No retractions. Lungs CTAB. Gastrointestinal: Soft and nontender. No distention.   Musculoskeletal: Warm and well perfused Neurologic:  Normal speech and language. No gross focal neurologic deficits are appreciated.  Skin:  Skin is warm, dry and intact. No rash noted. Psychiatric: Intellectual disability, however calm no  agitation  ____________________________________________   LABS (all labs ordered are listed, but only abnormal results are displayed)  Labs Reviewed  COMPREHENSIVE METABOLIC PANEL - Abnormal; Notable for the following components:      Result Value   Glucose, Bld 104 (*)    BUN 27 (*)    All other components within normal limits  ACETAMINOPHEN LEVEL - Abnormal; Notable for the following components:   Acetaminophen (Tylenol), Serum <10 (*)    All other components within normal limits  CBC - Abnormal; Notable for the following components:   WBC 10.6 (*)    RBC 3.85 (*)    All other components within normal limits  ETHANOL  SALICYLATE LEVEL  URINE DRUG SCREEN, QUALITATIVE ([facility] ONLY)  POC URINE PREG, ED   ____________________________________________  EKG   ____________________________________________  RADIOLOGY   ____________________________________________   PROCEDURES  Procedure(s) performed: No  Procedures   Critical Care performed: No ____________________________________________   INITIAL IMPRESSION / ASSESSMENT AND PLAN / ED COURSE  Pertinent labs & imaging results that were available during my care of the patient were reviewed by me and considered in my medical decision making (see chart for details).  Patient presents with SI and HI however this is not terribly unusual for her.  We will consult psychiatry.  She is medically cleared for consultation.  I did call her community support team who said there was nothing they could do to help [place].     ____________________________________________   FINAL CLINICAL IMPRESSION(S) / ED DIAGNOSES  Final diagnoses:  Suicidal ideation  Aggressive behavior        Note:  This document was prepared using Dragon voice recognition software and may include unintentional dictation errors.   Jene Every, MD 07/28/18 [phone]

## 2018-07-28 NOTE — Consult Note (Signed)
[facility] Face-to-Face Psychiatry Consult   Reason for Consult:  SI, HI Referring Physician:  Dr. Cyril Loosen [name] MRN:  [id number] Principal Diagnosis: <principal problem not specified> Diagnosis:  Active Problems:   * No active hospital problems. *   Total Time spent with patient: 30 minutes  Subjective:   [name] is a 53 y.o. female with a past psych h/o Schizoaffective disorder, borderline personality disorder, who was brought to ED from group home due to suicidal and homicidal ideations.  Per chart, this patient is well known to this ED and often presents with SI and HI. She was recently discharged from ED for similar complaints. Patient states she told someone at her group home to give her a knife so that she could stab her in the chest and then stab herself.  She wants to speak with a psychiatrist.  On interview, patient reports she is feeling suicidal and homicidal towards her group home administrator and "everyone there". She cannot explain why she is feeling this way, she cannot identify any triggers for her behavior. She also reports hearing "voices, telling to harm people". She reports feeling depressed due to her thoughts and voices. She reports she does not like her group home and does not want to return back there. She says she lives in this group home for a couple of weeks. She reports being compliant with all her psych medications. She is asking to be admitted to the behavioral health unit downstairs. Patient notified that there are currently no beds available in the unit, patient is asking to keep her in "some hospital for 72 hours". When she asked whet is going to happen in 72 hours, she answered "I will be happier and can go back to a group home". Patient offered to stay in ER for observation overnight and be reevaluated tomorrow. Patient agreed and said that she might improve "fifty-fifty".  Med Hx: asthma, cerebral palsy, COPD.  Past psych Hx:   Dx: Schizoaffective d/o, BPD. Multiple past psych admissions. Unknown h/o past suicide attempts.  Current psych medications: Risperidone  PO BID, Depakote  PO BID, Lexapro  PO daily, Klonopin 0.5mg  PO BID.  Social History: Patient is single. She lives in group home. She is on disability. She has no children. She reports no access to guns.  Substance use: denies.    Past Medical History:  Past Medical History:  Diagnosis Date  . Anxiety   . Asthma   . Borderline personality disorder (HCC)   . Cerebral palsy (HCC)   . COPD (chronic obstructive pulmonary disease) (HCC)   . Depression   . GERD (gastroesophageal reflux disease)   . Mild cognitive impairment   . Schizoaffective disorder (HCC)   . Wound abscess     Past Surgical History:  Procedure Laterality Date  . CHOLECYSTECTOMY    . INCISION AND DRAINAGE ABSCESS Right 01/02/2015   Procedure: INCISION AND DRAINAGE ABSCESS;  Surgeon: Tiney Rouge III, MD;  Location: [facility] ORS;  Service: General;  Laterality: Right;  . TONSILLECTOMY     Family History:  Family History  Problem Relation Age of Onset  . Heart attack Father   . Diabetes Mother    Family Psychiatric  History: patient denies suicides in family.  Social History:   Patient is single. She lives in group home. She is on disability. She has no children. She reports no access to guns.  Social History   Substance and Sexual Activity  Alcohol Use No  Social History   Substance and Sexual Activity  Drug Use No    Social History   Socioeconomic History  . Marital status: Single    Spouse name: Not on file  . Number of children: Not on file  . Years of education: Not on file  . Highest education level: Not on file  Occupational History  . Occupation: Disabled  Social Needs  . Financial resource strain: Not on file  . Food insecurity:    Worry: Not on file    Inability: Not on file  . Transportation needs:    Medical: Not on file     Non-medical: Not on file  Tobacco Use  . Smoking status: Former Games developer  . Smokeless tobacco: Never Used  Substance and Sexual Activity  . Alcohol use: No  . Drug use: No  . Sexual activity: Not Currently  Lifestyle  . Physical activity:    Days per week: Not on file    Minutes per session: Not on file  . Stress: Not on file  Relationships  . Social connections:    Talks on phone: Not on file    Gets together: Not on file    Attends religious service: Not on file    Active member of club or organization: Not on file    Attends meetings of clubs or organizations: Not on file    Relationship status: Not on file  Other Topics Concern  . Not on file  Social History Narrative   Pt lives with mother, nephew, sister in law   Additional Social History:    Allergies:   Allergies  Allergen Reactions  . Penicillins Nausea And Vomiting and Other (See Comments)    Has patient had a PCN reaction causing immediate rash, facial/tongue/throat swelling, SOB or lightheadedness with hypotension: No Has patient had a PCN reaction causing severe rash involving mucus membranes or skin necrosis: No Has patient had a PCN reaction that required hospitalization No Has patient had a PCN reaction occurring within the last 10 years: No If all of the above answers are "NO", then may proceed with Cephalosporin use      Labs:  Results for orders placed or performed during the hospital encounter of 07/28/18 (from the past 48 hour(s))  Comprehensive metabolic panel     Status: Abnormal   Collection Time: 07/28/18  1:56 PM  Result Value Ref Range   Sodium 144 135 - 145 mmol/L   Potassium 4.0 3.5 - 5.1 mmol/L   Chloride 109 98 - 111 mmol/L   CO2 26 22 - 32 mmol/L   Glucose, Bld 104 (H) 70 - 99 mg/dL   BUN 27 (H) 6 - 20 mg/dL   Creatinine, Ser 2.62 0.44 - 1.00 mg/dL   Calcium 9.2 8.9 - 03.5 mg/dL   Total Protein 7.3 6.5 - 8.1 g/dL   Albumin 3.5 3.5 - 5.0 g/dL   AST 24 15 - 41 U/L   ALT 21 0 - 44  U/L   Alkaline Phosphatase 49 38 - 126 U/L   Total Bilirubin 0.4 0.3 - 1.2 mg/dL   GFR calc non Af Amer >60 >60 mL/min   GFR calc Af Amer >60 >60 mL/min   Anion gap 9 5 - 15    Comment: Performed at [facility], [address], [place], [place] [address]  Ethanol     Status: None   Collection Time: 07/28/18  1:56 PM  Result Value Ref Range   Alcohol, Ethyl (B) <  10 <10 mg/dL    Comment: (NOTE) Lowest detectable limit for serum alcohol is 10 mg/dL. For medical purposes only. Performed at [facility], [address] [place], [place] [address]   Salicylate level     Status: None   Collection Time: 07/28/18  1:56 PM  Result Value Ref Range   Salicylate Lvl <7.0 2.8 - 30.0 mg/dL    Comment: Performed at [facility], [address] [place], [place] [address]  Acetaminophen level     Status: Abnormal   Collection Time: 07/28/18  1:56 PM  Result Value Ref Range   Acetaminophen (Tylenol), Serum <10 (L) 10 - 30 ug/mL    Comment: (NOTE) Therapeutic concentrations vary significantly. A range of 10-30 ug/mL  may be an effective concentration for many patients. However, some  are best treated at concentrations outside of this range. Acetaminophen concentrations >150 ug/mL at 4 hours after ingestion  and >50 ug/mL at 12 hours after ingestion are often associated with  toxic reactions. Performed at [facility], [address] [place], [place] [address]   cbc     Status: Abnormal   Collection Time: 07/28/18  1:56 PM  Result Value Ref Range   WBC 10.6 (H) 4.0 - 10.5 K/uL   RBC 3.85 (L) 3.87 - 5.11 MIL/uL   Hemoglobin 12.4 12.0 - 15.0 g/dL   HCT 91.4 78.2 - 95.6 %   MCV 99.0 80.0 - 100.0 fL   MCH 32.2 26.0 - 34.0 pg   MCHC 32.5 30.0 - 36.0 g/dL   RDW 21.3 08.6 - 57.8 %   Platelets 367 150 - 400 K/uL   nRBC 0.0 0.0 - 0.2 %    Comment: Performed at [facility], [address] [place], [place] [address]    No current  facility-administered medications for this encounter.    Current Outpatient Medications  Medication Sig Dispense Refill  . albuterol (PROVENTIL HFA;VENTOLIN HFA) 108 (90 Base) MCG/ACT inhaler Inhale 2 puffs into the lungs every 4 (four) hours as needed for wheezing or shortness of breath. (Patient not taking: Reported on 06/25/2018) 1 Inhaler 0  . aspirin EC 81 MG tablet Take 1 tablet (81 mg total) by mouth daily. (Patient not taking: Reported on 07/26/2018) 30 tablet 1  . clobetasol cream (TEMOVATE) 0.05 % Apply 1 application topically 2 (two) times daily. (Patient not taking: Reported on 07/26/2018) 30 g 0  . clonazePAM (KLONOPIN) 0.5 MG tablet Take 1 tablet (0.5 mg total) by mouth 2 (two) times daily. 60 tablet 1  . divalproex (DEPAKOTE) 500 MG DR tablet Take 1 tablet (500 mg total) by mouth 2 (two) times daily. 60 tablet 1  . escitalopram (LEXAPRO) 20 MG tablet Take 1 tablet (20 mg total) by mouth daily. 30 tablet 1  . famotidine (PEPCID) 20 MG tablet Take 1 tablet (20 mg total) by mouth 2 (two) times daily. 30 tablet 0  . fenofibrate 160 MG tablet Take 1 tablet (160 mg total) by mouth daily. 30 tablet 1  . hydrOXYzine (ATARAX/VISTARIL) 25 MG tablet Take 1 tablet (25 mg total) by mouth every 6 (six) hours as needed for anxiety. (Patient taking differently: Take 25 mg by mouth 3 (three) times daily. ) 12 tablet 0  . levothyroxine (SYNTHROID, LEVOTHROID) 50 MCG tablet Take 1 tablet (50 mcg total) by mouth daily before breakfast. 30 tablet 1  . oxybutynin (DITROPAN-XL) 5 MG 24 hr tablet Take 1 tablet (5 mg total) by mouth daily. 30 tablet  1  . pantoprazole (PROTONIX) 40 MG tablet Take 1 tablet (40 mg total) by mouth daily. 30 tablet 1  . risperiDONE (RISPERDAL) 3 MG tablet Take 1 tablet (3 mg total) by mouth 2 (two) times daily. 60 tablet 1  . sucralfate (CARAFATE) 1 g tablet Take 1 tablet (1 g total) by mouth 4 (four) times daily -  with meals and at bedtime. (Patient taking differently: Take 1 g by  mouth 3 (three) times daily. ) 120 tablet 1    Musculoskeletal: Strength & Muscle Tone: within normal limits Gait & Station: normal Patient leans: N/A  Psychiatric Specialty Exam: Physical Exam  ROS  Blood pressure 105/67, pulse 99, temperature 97.8 F (36.6 C), temperature source Oral, resp. rate 16, height 5\' 3"  (1.6 m), weight 95 kg, last menstrual period 05/28/2018, SpO2 93 %.Body mass index is 37.1 kg/m.  General Appearance: Disheveled  Eye Contact:  Fair  Speech:  Garbled  Volume:  Normal  Mood:  Dysphoric  Affect:  Constricted  Thought Process:  Coherent  Orientation:  Full (Time, Place, and Person)  Thought Content:  Illogical  Suicidal Thoughts:  Yes.  without intent/plan  Homicidal Thoughts:  Yes.  without intent/plan  Memory:  Immediate;   Fair Recent;   Fair Remote;   Fair  Judgement:  Poor  Insight:  Lacking  Psychomotor Activity:  Increased  Concentration:  Concentration: Fair and Attention Span: Fair  Recall:  FiservFair  Fund of Knowledge:  Poor  Language:  Fair  Akathisia:  No  Handed:  Right  AIMS (if indicated):     Assets:  Desire for Improvement Housing Social Support  ADL's:  Intact  Cognition:  Impaired,  Mild  Sleep:        Treatment Plan Summary: Daily contact with patient to assess and evaluate symptoms and progress in treatment  Disposition:  [name] is a 53 y.o. female with a past psych h/o Schizoaffective disorder, borderline personality disorder, who was brought to ED from group home due to suicidal and homicidal ideations. The patient is well known to the ED and often presents with similar presentation - SI and HI in settings of not liking her group home. She was recently discharged from ED for similar complaints. Patient is focused on psych admission. I don`t believe that the patient is truly suicidal or homicidal and meets inpatient psych admission criteria. She appears to be future-oriented, has access to mental health care, has place  to live and community support. Patient will stay in ER overnight and needs to be reevaluated by psychiatry tomorrow re safety for discharge vs admission.  Please, continue her current psych medications: Risperidone 3mg  PO BID, Depakote 500mg  PO BID, Lexapro 20mg  PO daily, Klonopin 0.5mg  PO BID.   ,Thalia PartyAlisa Loris Winrow, MD 07/28/2018 3:56 PM

## 2018-07-28 NOTE — ED Notes (Signed)
BEHAVIORAL HEALTH ROUNDING Patient sleeping: No. Patient alert and oriented: yes Behavior appropriate: Yes.  ; If no, describe:  Nutrition and fluids offered: yes Toileting and hygiene offered: Yes  Sitter present: q15 minute observations and security  monitoring Law enforcement present: Yes  ODS  

## 2018-07-28 NOTE — ED Notes (Signed)
Patient currently endorses SI and HI toward the group home owner [place]).  Patient oriented to unit.  No questions or concerns.

## 2018-07-28 NOTE — ED Notes (Addendum)
Pt dressed out by this RN and Gerilyn Pilgrim EDT.   Belongings are: red plaid button down, teal floral long sleeve shirt, blue jeans, white tennis shoes, bra, one pair of socks. Pt has $80 (placed in specimen cup and labeled and placed in pt shoe).   No jewelry noted. Pt came in wearing a brief that was dirty. Removed and given hospital underwear.   Pt kept glasses. Pt arrived with high fall risk bracelet already on from previous visit.

## 2018-07-28 NOTE — ED Triage Notes (Signed)
First RN Note: Pt presents to ED via BPD voluntarily with c/o HI. Pt states she told her group home administrator that she would stab her and then stab herself. Pt also voices active SI at this time. Pt states she does not like the administrator of her group home.

## 2018-07-28 NOTE — ED Notes (Signed)

## 2018-07-29 DIAGNOSIS — F4325 Adjustment disorder with mixed disturbance of emotions and conduct: Secondary | ICD-10-CM

## 2018-07-29 NOTE — ED Notes (Signed)
Patient ate 100% of breakfast and beverage.  

## 2018-07-29 NOTE — ED Notes (Signed)
Nurse has called group home twice now, no answer, and voice mail filled up, could not leave a message. Patient is awaiting a ride to go home, let her know as soon as nurse found out about ride that she would keep her updated. Patient is eager to get clothing.

## 2018-07-29 NOTE — ED Provider Notes (Signed)
-----------------------------------------   3:55 AM on 07/29/2018 -----------------------------------------   Blood pressure 105/67, pulse 99, temperature 97.8 F (36.6 C), temperature source Oral, resp. rate 16, height 1.6 m (5\' 3" ), weight 95 kg, last menstrual period 05/28/2018, SpO2 93 %.  The patient is calm and cooperative at this time.  There have been no acute events since the last update.  Awaiting disposition plan from Behavioral Medicine team.   Loleta Rose, MD 07/29/18 [phone]

## 2018-07-29 NOTE — ED Notes (Signed)
Nurse talked to [place] at the group home, and she states she will call back when she can come to pick her up, but [facility], we have already put in our 30 day notice" nurse informed her to talk with social worker and she said she is going to call back to talk to someone about her behavior, but will come pick her up at some point today. Nurse let her know that discharge had ordered and that she did not meet the criteria for admission here at this hospital.

## 2018-07-29 NOTE — Consult Note (Addendum)
[facility]  07/29/2018 10:10 AM Tempie DonningKimberly Lamphier  MRN:  [id number] Principal Problem: Adjustment disorder with mixed disturbance of emotions and conduct Discharge Diagnoses: Principal Problem:   Adjustment disorder with mixed disturbance of emotions and conduct Active Problems:   Borderline personality disorder (HCC)   Schizoaffective disorder, bipolar type (HCC)  Plan:  Discharge to her group home, Dr Lucianne MussKumar reviewed this patient and concurs with the plan.    Assessment: Patient got upset at her group home and stated she wanted to hurt herself and others but not actions.  She is well known to this ED and providers for similar presentations, multiple in the past few weeks, when she gets upset.  Caveat:  Cognitive impairment with low low threshold for frustration.  On assessment, she denies current suicidal ideations but does not like her group home, "too strict".  Unfortunately, she did not like living with her family either.  No hallucinations or homicidal ideations or substance abuse.  Her suicidal ideations are initiated with stress and reduced with social support.  She does have a care coordinator.  Group home has given her a 30-day notice at this time.  Total Time spent with patient: 45 minutes  Past Psychiatric History: schizoaffective disorder  Past Medical History:  Past Medical History:  Diagnosis Date  . Anxiety   . Asthma   . Borderline personality disorder (HCC)   . Cerebral palsy (HCC)   . COPD (chronic obstructive pulmonary disease) (HCC)   . Depression   . GERD (gastroesophageal reflux disease)   . Mild cognitive impairment   . Schizoaffective disorder (HCC)   . Wound abscess     Past Surgical History:  Procedure Laterality Date  . CHOLECYSTECTOMY    . INCISION AND DRAINAGE ABSCESS Right 01/02/2015   Procedure: INCISION AND DRAINAGE ABSCESS;  Surgeon: Tiney Rougealph Ely III, MD;  Location: [facility] ORS;  Service: General;  Laterality: Right;  . TONSILLECTOMY      Family History:  Family History  Problem Relation Age of Onset  . Heart attack Father   . Diabetes Mother    Family Psychiatric  History: none Social History:  Social History   Substance and Sexual Activity  Alcohol Use No     Social History   Substance and Sexual Activity  Drug Use No    Social History   Socioeconomic History  . Marital status: Single    Spouse name: Not on file  . Number of children: Not on file  . Years of education: Not on file  . Highest education level: Not on file  Occupational History  . Occupation: Disabled  Social Needs  . Financial resource strain: Not on file  . Food insecurity:    Worry: Not on file    Inability: Not on file  . Transportation needs:    Medical: Not on file    Non-medical: Not on file  Tobacco Use  . Smoking status: Former Games developermoker  . Smokeless tobacco: Never Used  Substance and Sexual Activity  . Alcohol use: No  . Drug use: No  . Sexual activity: Not Currently  Lifestyle  . Physical activity:    Days per week: Not on file    Minutes per session: Not on file  . Stress: Not on file  Relationships  . Social connections:    Talks on phone: Not on file    Gets together: Not on file    Attends religious service: Not on file    Active member of  club or organization: Not on file    Attends meetings of clubs or organizations: Not on file    Relationship status: Not on file  Other Topics Concern  . Not on file  Social History Narrative   Pt lives with mother, nephew, sister in law    Has this patient used any form of tobacco in the last 30 days? (Cigarettes, Smokeless Tobacco, Cigars, and/or Pipes) NA  Current Medications: No current facility-administered medications for this encounter.    Current Outpatient Medications  Medication Sig Dispense Refill  . clonazePAM (KLONOPIN) 0.5 MG tablet Take 1 tablet (0.5 mg total) by mouth 2 (two) times daily. 60 tablet 1  . divalproex (DEPAKOTE) 500 MG DR tablet Take 1  tablet (500 mg total) by mouth 2 (two) times daily. 60 tablet 1  . escitalopram (LEXAPRO) 20 MG tablet Take 1 tablet (20 mg total) by mouth daily. 30 tablet 1  . famotidine (PEPCID) 20 MG tablet Take 1 tablet (20 mg total) by mouth 2 (two) times daily. 30 tablet 0  . fenofibrate 160 MG tablet Take 1 tablet (160 mg total) by mouth daily. 30 tablet 1  . hydrOXYzine (ATARAX/VISTARIL) 25 MG tablet Take 1 tablet (25 mg total) by mouth every 6 (six) hours as needed for anxiety. (Patient taking differently: Take 25 mg by mouth 3 (three) times daily. ) 12 tablet 0  . levothyroxine (SYNTHROID, LEVOTHROID) 50 MCG tablet Take 1 tablet (50 mcg total) by mouth daily before breakfast. 30 tablet 1  . oxybutynin (DITROPAN-XL) 5 MG 24 hr tablet Take 1 tablet (5 mg total) by mouth daily. 30 tablet 1  . pantoprazole (PROTONIX) 40 MG tablet Take 1 tablet (40 mg total) by mouth daily. 30 tablet 1  . risperiDONE (RISPERDAL) 3 MG tablet Take 1 tablet (3 mg total) by mouth 2 (two) times daily. 60 tablet 1  . sucralfate (CARAFATE) 1 g tablet Take 1 tablet (1 g total) by mouth 4 (four) times daily -  with meals and at bedtime. (Patient taking differently: Take 1 g by mouth 3 (three) times daily. ) 120 tablet 1  . albuterol (PROVENTIL HFA;VENTOLIN HFA) 108 (90 Base) MCG/ACT inhaler Inhale 2 puffs into the lungs every 4 (four) hours as needed for wheezing or shortness of breath. (Patient not taking: Reported on 06/25/2018) 1 Inhaler 0  . aspirin EC 81 MG tablet Take 1 tablet (81 mg total) by mouth daily. (Patient not taking: Reported on 07/26/2018) 30 tablet 1  . clobetasol cream (TEMOVATE) 0.05 % Apply 1 application topically 2 (two) times daily. (Patient not taking: Reported on 07/26/2018) 30 g 0   PTA Medications: (Not in a hospital admission)   Musculoskeletal: Strength & Muscle Tone: within normal limits Gait & Station: normal Patient leans: N/A  Psychiatric Specialty Exam: Physical Exam  Nursing note and vitals  reviewed. Constitutional: She is oriented to person, place, and time. She appears well-developed and well-nourished.  HENT:  Head: Normocephalic.  Neck: Normal range of motion.  Respiratory: Effort normal.  Musculoskeletal: Normal range of motion.  Neurological: She is alert and oriented to person, place, and time.  Psychiatric: Her speech is normal and behavior is normal. Judgment and thought content normal. Her mood appears anxious. Her affect is blunt. Cognition and memory are impaired.    Review of Systems  Psychiatric/Behavioral: The patient is nervous/anxious.   All other systems reviewed and are negative.   Blood pressure 121/68, pulse 70, temperature 97.8 F (36.6 C), temperature source Oral,  resp. rate 18, height  (1.6 m), weight 95 kg, last menstrual period 05/28/2018, SpO2 94 %.Body mass index is 37.1 kg/m.  General Appearance: Casual  Eye Contact:  Good  Speech:  Normal Rate  Volume:  Normal  Mood:  Anxious  Affect:  Blunt  Thought Process:  Coherent and Descriptions of Associations: Intact  Orientation:  Full (Time, Place, and Person)  Thought Content:  WDL and Logical  Suicidal Thoughts:  No  Homicidal Thoughts:  No  Memory:  Immediate;   Good Recent;   Good Remote;   Good  Judgement:  Poor  Insight:  Fair  Psychomotor Activity:  Normal  Concentration:  Concentration: Good and Attention Span: Good  Recall:  [employer] of Knowledge:  Fair  Language:  Good  Akathisia:  No  Handed:  Right  AIMS (if indicated):   N/A  Assets:  Housing Leisure Time Physical Health Resilience Social Support  ADL's:  Intact  Cognition:  WNL  Sleep:   N/A     Demographic Factors:  Caucasian  Loss Factors: NA  Historical Factors: Impulsivity  Risk Reduction Factors:   Sense of responsibility to family, Living with another person, especially a relative, Positive social support and Positive therapeutic relationship  Continued Clinical Symptoms:  Anxiety,  mild  Cognitive Features That Contribute To Risk:  None    Suicide Risk:  Minimal: No identifiable suicidal ideation.  Patients presenting with no risk factors but with morbid ruminations; may be classified as minimal risk based on the severity of the depressive symptoms   Plan Of Care/Follow-up recommendations:  Adjustment disorder with mixed disturbance of emotions and conduct: -Restarted Klonopin 0.5 mg BID -Restarted Vistaril 25 mg every 8 hours PRN anxiety  Schizoaffective disorder, depressive disorder: -Restarted Lexapro 20 mg daily -Restarted Depakote 500 mg BID -Continued Risperdal 3 mg BID -Contacted her ACT team and care coordinator for update and coordination of care  Asthma: -Started albuterol 2 puffs every hours Activity:  as tolerated Diet:  heart healthy diet  Disposition: discharge to her ACT team  Nanine Means, NP 07/29/2018, 10:10 AM   Patient seen face-to-face for psychiatric evaluation, chart reviewed and case discussed with the physician extender and developed treatment plan. Reviewed the information documented and agree with the treatment plan.

## 2018-07-29 NOTE — Discharge Instructions (Signed)
Borderline Personality Disorder, Adult Borderline personality disorder (BPD) is a mental health disorder. People with this condition:  Have unstable moods and relationships.  Have trouble controlling emotions.  Often engage in impulsive or reckless behavior.  Often fear being abandoned by friends or family. People with BPD often have other mental health issues, such as depression, an anxiety disorder, a substance abuse disorder, or an eating disorder. They may develop suicidal thoughts or behaviors. What are the causes? The exact cause of this condition is not known. Possible causes may include:  Genetic factors. These are traits that are passed down from one generation to the next. Many people with BPD have a family history of the disorder.  Physical factors. The part of the brain that controls emotion may be different in people who have this condition.  Social factors. Traumatic experiences involving other people may play a role in the development of BPD. These may include child abuse or neglect. What increases the risk? This condition may be more likely to develop in people who:  Have a parent or close relative with the condition.  Experienced physical or sexual abuse as a child.  Experienced neglect or were separated from their parents as a child.  Have unstable family relationships or an unstable home. What are the signs or symptoms? Symptoms of this condition usually start during the teen years or in early adulthood. Symptoms include:  Extreme overreactions to the possibility of being abandoned by family or friends. This may include explosive responses to seemingly minor events, such as a change of plans.  Volatile relationships with friends and relatives. This may include extreme swings from feelings of love to intense anger.  Distorted or unstable self-image. This may affect mood, relationships, and future goals or plans.  Reckless or impulsive behaviors, such as  shopping sprees, risky sexual behavior, substance abuse, or overeating.  Self-harm, such as cutting.  Expressing thoughts of suicide.  Extreme mood swings that can last for hours or days.  Constant boredom.  Problems controlling anger. This can result in shame or guilt.  Paranoid thoughts.  Losing touch with reality, often in order to help deal with unbearable situations (dissociation). How is this diagnosed? This condition is diagnosed based on the person's symptoms. Information about the person's symptoms is gathered from family, friends, and medical and legal professionals. A health care provider who specializes in physical and psychological causes of mental conditions (psychiatrist) will do a psychiatric evaluation. The person will be diagnosed with BPD if she or he has at least five common symptoms of the condition. How is this treated? This condition is usually treated by mental health professionals, such as psychologists, psychiatrists, and clinical social workers. More than one type of treatment may be needed. Treatment may include therapy such as:  Psychotherapy. This may also be called talk therapy or counseling.  Cognitive behavioral therapy (CBT). This helps the person to recognize and change unhealthy feelings, thoughts, and behaviors. It helps him or her find new, more positive thoughts and actions to replace the old ones.  Dialectical behavioral therapy (DBT). Through this type of treatment, a person learns to understand his or her feelings and to regulate them. This may be one-on-one treatment or part of group therapy.  Schema-focused therapy (SFT). This form of therapy helps a person with a distorted self-image to see him or herself differently. This may be one-on-one treatment or part of group therapy.  Family therapy. This treatment includes family members. Medicine may be used to help  control emotions and behavior and to treat anxiety and depression. The person may  need to stay in the hospital for protection if he or she is behaving in a dangerous way or expressing thoughts of suicide. °Follow these instructions at home: °The person with BPD should do these things: °· Take over-the-counter and prescription medicines only as told by his or her health care provider. °· Maintain a daily routine. The routine should include set times to eat and sleep. °· Do regular physical activity to reduce stress. He or she should ask a health care provider to recommend activities. °· Communicate with close friends and relatives about what triggers his or her symptoms. °· Find comforting people and environments. °· Keep all follow-up visits as told by her or his health care provider. This is important. °Where to find more information °· National Alliance on Mental Illness: www.nami.org °· [place] National Institute of Mental Health: www.nimh.nih.gov °Contact a health care provider if: °· The person's symptoms do not improve or they get worse. °· The person has new symptoms. °· The person uses drugs. °· The person's alcohol use increases. °· The person has trouble sleeping. °· The person eats too much or not enough. °Get help right away if: °· The person harms himself or herself, or expresses serious thoughts about self-harming. °· The person expresses serious thoughts about hurting others. °· The person sees or hears things that are not there (has hallucinations). °· The person disconnects from reality. °· The person is unconscious. °If it ever seems like the person may hurt himself/herself or others, or may have thoughts about taking his or her own life, get help right away. You can go to your nearest emergency department or call: °· Your local emergency services (911 in the [place] °· A suicide crisis helpline, such as the National Suicide Prevention Lifeline at 1-800-273-8255. This is open 24 hours a day. °Summary °· Borderline personality disorder (BPD) is a mental health disorder. People with  this condition may have unstable moods and relationships, have trouble controlling emotions, and may engage in impulsive or reckless behavior. °· A health care provider who specializes in physical and psychological causes of mental conditions (psychiatrist) will do a psychiatric evaluation. The person will be diagnosed with BPD if she or he has at least five common symptoms of the condition. °· This condition is usually treated by mental health professionals, such as psychologists, psychiatrists, and clinical social workers. More than one type of treatment may be needed. °· Contact a health care provider if the person's symptoms do not improve or they get worse. °This information is not intended to replace advice given to you by your health care provider. Make sure you discuss any questions you have with your health care provider. °Document Released: 07/19/2010 Document Revised: 06/08/2016 Document Reviewed: 06/08/2016 °[employer]vier Interactive Patient Education © 2019 [employer] ° °

## 2018-07-29 NOTE — ED Notes (Signed)
Patient sitting in dayroom, keeps asking for her clothes, nurse let her know that she could not change until she was about ready to go back to group home, Patient with discharge orders.

## 2018-08-01 ENCOUNTER — Emergency Department
Admission: EM | Admit: 2018-08-01 | Discharge: 2018-08-09 | Disposition: A | Discharge status: Home / Self Care | Payer: Medicare Other | Attending: Emergency Medicine | Admitting: Emergency Medicine

## 2018-08-01 ENCOUNTER — Other Ambulatory Visit: Payer: Self-pay

## 2018-08-01 DIAGNOSIS — G809 Cerebral palsy, unspecified: Secondary | ICD-10-CM | POA: Diagnosis not present

## 2018-08-01 DIAGNOSIS — G3184 Mild cognitive impairment, so stated: Secondary | ICD-10-CM | POA: Diagnosis not present

## 2018-08-01 DIAGNOSIS — F99 Mental disorder, not otherwise specified: Secondary | ICD-10-CM | POA: Diagnosis present

## 2018-08-01 DIAGNOSIS — F4325 Adjustment disorder with mixed disturbance of emotions and conduct: Secondary | ICD-10-CM | POA: Diagnosis not present

## 2018-08-01 DIAGNOSIS — F70 Mild intellectual disabilities: Secondary | ICD-10-CM | POA: Insufficient documentation

## 2018-08-01 DIAGNOSIS — Z9049 Acquired absence of other specified parts of digestive tract: Secondary | ICD-10-CM | POA: Insufficient documentation

## 2018-08-01 DIAGNOSIS — Z87891 Personal history of nicotine dependence: Secondary | ICD-10-CM | POA: Diagnosis not present

## 2018-08-01 DIAGNOSIS — R45851 Suicidal ideations: Secondary | ICD-10-CM | POA: Insufficient documentation

## 2018-08-01 DIAGNOSIS — F250 Schizoaffective disorder, bipolar type: Secondary | ICD-10-CM | POA: Diagnosis not present

## 2018-08-01 DIAGNOSIS — R4189 Other symptoms and signs involving cognitive functions and awareness: Secondary | ICD-10-CM

## 2018-08-01 DIAGNOSIS — J449 Chronic obstructive pulmonary disease, unspecified: Secondary | ICD-10-CM | POA: Insufficient documentation

## 2018-08-01 DIAGNOSIS — Z79899 Other long term (current) drug therapy: Secondary | ICD-10-CM | POA: Insufficient documentation

## 2018-08-01 DIAGNOSIS — J45909 Unspecified asthma, uncomplicated: Secondary | ICD-10-CM | POA: Diagnosis not present

## 2018-08-01 DIAGNOSIS — F419 Anxiety disorder, unspecified: Secondary | ICD-10-CM | POA: Diagnosis not present

## 2018-08-01 DIAGNOSIS — N3000 Acute cystitis without hematuria: Secondary | ICD-10-CM | POA: Diagnosis not present

## 2018-08-01 DIAGNOSIS — F603 Borderline personality disorder: Secondary | ICD-10-CM | POA: Diagnosis present

## 2018-08-01 NOTE — ED Notes (Signed)
Hourly rounding reveals patient sleeping in room. No complaints, stable, in no acute distress. Q15 minute rounds and monitoring via Security Cameras to continue. 

## 2018-08-01 NOTE — ED Provider Notes (Signed)
[facility] Emergency Department Provider Note  Time seen: 5:50 PM  I have reviewed the triage vital signs and the nursing notes.   HISTORY  Chief Complaint Homicidal  HPI [name] is a 53 y.o. female with a past medical history anxiety, asthma, cerebral palsy, COPD, cognitive impairment, schizoaffective presents to the emergency department with complaints of feeling suicidal and homicidal.  Patient is extremely well-known to the emergency department with multiple visits with similar presentations.  Patient states she hates [facility] Drone at her group home and wants to stab her with a knife.  States she is going to go to [place] and get a knife and stabbed [place] and then stab her self.  Patient has no medical complaints today.  No cough congestion fever recent travel.   Past Medical History:  Diagnosis Date  . Anxiety   . Asthma   . Borderline personality disorder (HCC)   . Cerebral palsy (HCC)   . COPD (chronic obstructive pulmonary disease) (HCC)   . Depression   . GERD (gastroesophageal reflux disease)   . Mild cognitive impairment   . Schizoaffective disorder (HCC)   . Wound abscess     Patient Active Problem List   Diagnosis Date Noted  . Suicidal ideation   . Suicidal behavior without attempted self-injury 07/23/2018  . Rash 07/22/2018  . 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 Overdose by acetaminophen 08/08/2016  . Schizoaffective disorder, bipolar type (HCC)   . 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 GERD (gastroesophageal reflux disease) 09/24/2014  . COPD (chronic obstructive pulmonary disease) (HCC) 09/24/2014  . Borderline personality disorder (HCC) 09/24/2014  . Cerebral palsy (HCC) 08/31/2014    Past Surgical History:  Procedure Laterality Date  . CHOLECYSTECTOMY    . INCISION AND DRAINAGE ABSCESS Right 01/02/2015   Procedure: INCISION AND DRAINAGE ABSCESS;  Surgeon: Tiney Rouge III, MD;   Location: [facility] ORS;  Service: General;  Laterality: Right;  . TONSILLECTOMY      Prior to Admission medications   Medication Sig Start Date End Date Taking? Authorizing Provider  albuterol (PROVENTIL HFA;VENTOLIN HFA) 108 (90 Base) MCG/ACT inhaler Inhale 2 puffs into the lungs every 4 (four) hours as needed for wheezing or shortness of breath. Patient not taking: Reported on 06/25/2018 06/17/18   Charm Rings, NP  aspirin EC 81 MG tablet Take 1 tablet (81 mg total) by mouth daily. Patient not taking: Reported on 07/26/2018 12/14/17   Clapacs, Jackquline Denmark, MD  clobetasol cream (TEMOVATE) 0.05 % Apply 1 application topically 2 (two) times daily. Patient not taking: Reported on 07/26/2018 07/22/18   Phineas Semen, MD  clonazePAM (KLONOPIN) 0.5 MG tablet Take 1 tablet (0.5 mg total) by mouth 2 (two) times daily. 12/14/17   Clapacs, Jackquline Denmark, MD  divalproex (DEPAKOTE) 500 MG DR tablet Take 1 tablet (500 mg total) by mouth 2 (two) times daily. 12/14/17   Clapacs, Jackquline Denmark, MD  escitalopram (LEXAPRO) 20 MG tablet Take 1 tablet (20 mg total) by mouth daily. 12/14/17   Clapacs, Jackquline Denmark, MD  famotidine (PEPCID) 20 MG tablet Take 1 tablet (20 mg total) by mouth 2 (two) times daily. 06/11/18   Khatri, Hina, PA-C  fenofibrate 160 MG tablet Take 1 tablet (160 mg total) by mouth daily. 12/14/17   Clapacs, Jackquline Denmark, MD  hydrOXYzine (ATARAX/VISTARIL) 25 MG tablet Take 1 tablet (25 mg total) by mouth every 6 (six) hours as needed for anxiety. Patient taking differently: Take 25  mg by mouth 3 (three) times daily.  05/11/18   Fayrene Helper, PA-C  levothyroxine (SYNTHROID, LEVOTHROID) 50 MCG tablet Take 1 tablet (50 mcg total) by mouth daily before breakfast. 12/14/17   Clapacs, Jackquline Denmark, MD  oxybutynin (DITROPAN-XL) 5 MG 24 hr tablet Take 1 tablet (5 mg total) by mouth daily. 12/14/17   Clapacs, Jackquline Denmark, MD  pantoprazole (PROTONIX) 40 MG tablet Take 1 tablet (40 mg total) by mouth daily. 12/14/17   Clapacs, Jackquline Denmark, MD  risperiDONE  (RISPERDAL) 3 MG tablet Take 1 tablet (3 mg total) by mouth 2 (two) times daily. 12/14/17   Clapacs, Jackquline Denmark, MD  sucralfate (CARAFATE) 1 g tablet Take 1 tablet (1 g total) by mouth 4 (four) times daily -  with meals and at bedtime. Patient taking differently: Take 1 g by mouth 3 (three) times daily.  12/14/17   Clapacs, Jackquline Denmark, MD    Allergies  Allergen Reactions  . Penicillins Nausea And Vomiting and Other (See Comments)    Has patient had a PCN reaction causing immediate rash, facial/tongue/throat swelling, SOB or lightheadedness with hypotension: No Has patient had a PCN reaction causing severe rash involving mucus membranes or skin necrosis: No Has patient had a PCN reaction that required hospitalization No Has patient had a PCN reaction occurring within the last 10 years: No If all of the above answers are "NO", then may proceed with Cephalosporin use      Family History  Problem Relation Age of Onset  . Heart attack Father   . Diabetes Mother     Social History Social History   Tobacco Use  . Smoking status: Former Games developer  . Smokeless tobacco: Never Used  Substance Use Topics  . Alcohol use: No  . Drug use: No    Review of Systems Constitutional: Negative for fever Cardiovascular: Negative for chest pain. Respiratory: Negative for shortness of breath. Gastrointestinal: Negative for abdominal pain Musculoskeletal: Negative for musculoskeletal complaints Skin: Negative for skin complaints  Neurological: Negative for headache All other ROS negative  ____________________________________________   PHYSICAL EXAM:  VITAL SIGNS: ED Triage Vitals  Enc Vitals Group     BP 08/01/18 1718 107/76     Pulse Rate 08/01/18 1718 84     Resp 08/01/18 1718 18     Temp 08/01/18 1718 97.9 F (36.6 C)     Temp Source 08/01/18 1718 Oral     SpO2 08/01/18 1718 94 %     Weight 08/01/18 1719 209 lb 7 oz (95 kg)     Height 08/01/18 1719 5\' 3"  (1.6 m)     Head Circumference --       Peak Flow --      Pain Score 08/01/18 1719 0     Pain Loc --      Pain Edu? --      Excl. in GC? --     Constitutional: Alert and oriented. Well appearing and in no distress. Eyes: Normal exam ENT      Head: Normocephalic and atraumatic.      Mouth/Throat: Mucous membranes are moist. Cardiovascular: Normal rate, regular rhythm. Respiratory: Normal respiratory effort without tachypnea nor retractions. Breath sounds are clear  Gastrointestinal: Soft and nontender. No distention.   Musculoskeletal: Nontender with normal range of motion in all extremities.  Neurologic:  Normal speech and language. No gross focal neurologic deficits  Skin:  Skin is warm, dry and intact.  Psychiatric: States suicidal and homicidal ideation.  INITIAL IMPRESSION / ASSESSMENT AND PLAN / ED COURSE  Pertinent labs & imaging results that were available during my care of the patient were reviewed by me and considered in my medical decision making (see chart for details).   Patient presents emergency department with complaints of suicidal and homicidal ideation.  Patient is very well-known to the emergency department for similar complaints because she does not like her group home.  However given the patient's complaint of HI as well as SI we will have psychiatry evaluate to ensure the patient is safe for discharge back to her group facility.  We will check basic labs as a precaution.  Psychiatric disposition pending.  Sherri DonningKimberly Pulcini was evaluated in Emergency Department on 08/01/2018 for the symptoms described in the history of present illness. She was evaluated in the context of the global COVID-19 pandemic, which necessitated consideration that the patient might be at risk for infection with the SARS-CoV-2 virus that causes COVID-19. Institutional protocols and algorithms that pertain to the evaluation of patients at risk for COVID-19 are in a state of rapid change based on information released by regulatory  bodies including the CDC and federal and state organizations. These policies and algorithms were followed during the patient's care in the ED.  ____________________________________________   FINAL CLINICAL IMPRESSION(S) / ED DIAGNOSES  Homicidal ideation Suicidal ideation   Minna AntisPaduchowski, Aamira Bischoff, MD 08/01/18 2312

## 2018-08-01 NOTE — ED Notes (Signed)
Report to include Situation, Background, Assessment, and Recommendations received from [facility] RN. Patient alert and oriented, warm and dry, in no acute distress. Patient denies AVH and pain. Patient states she wants to hurt the group home staff and and she wants to die. Patient made aware of Q15 minute rounds and security cameras for their safety. Patient instructed to come to me with needs or concerns.

## 2018-08-01 NOTE — ED Notes (Signed)
Red shirt, white bra, white shoes, white socks, grey sweatpants and animal print purse placed in labeled 1 of 1 belonging bag.

## 2018-08-01 NOTE — Consult Note (Addendum)
[facility] Face-to-Face Psychiatry Consult   Reason for Consult: Suicidal ideation Referring Physician:  Dr. Lenard Lance [name] MRN:  [id number] Principal Diagnosis: Adjustment disorder with mixed disturbance of emotions and conduct Diagnosis:  Principal Problem:   Adjustment disorder with mixed disturbance of emotions and conduct   Total Time spent with patient: 30 minutes  Subjective: " I am not going back to that group home"  [name] is a 53 y.o. female patient presented to [facility] ED via law enforcement under involuntary commitment status (IVC).  This is the patient's multiplies emergency room visit this month.  Patient is chronically suicidal and homicidal without any intent or having a plan voiced the patient did tell to ED nurse that she was going to overdose on some pills. The patient currently lives in a group home which all medications and knives are locked up and there is staff available 24 hours a day 7 days a week.  The patient was seen face-to-face by this provider; chart reviewed and consulted with Dr.Paduchowski on 08/01/2018 due to the care of the patient. It was discussed with the provider that the patient does not meet criteria to be admitted to the inpatient unit.   Due to the patient voicing suicidal ideation without a plan during her encounter with this provider.  The patient does live in a group home and the group home staff have put many safety measures into place due to the patient multiple times of her voicing suicidal ideation.   On evaluation the patient is alert and oriented x4, calm and cooperative, and mood-congruent with affect. The patient does not appear to be responding to internal or external stimuli. Neither is the patient presenting with any delusional thinking. The patient denies auditory or visual hallucinations. The patient does admit to suicidal and self-harm ideations, but without a plan.    Collateral was not obtained. This  provider called [facility], at [employer] [phone]) voiced mailed was not personalized, no message was left.   Plan: the patient is not a safety risk to herself or others and currently does not require inpatient stabilization and treatment.  The patient will remain overnight in the BHU and TTS can connect with the patient    HPI:  Per Dr. Lenard Lance; HPI [name] is a 53 y.o. female with a past medical history anxiety, asthma, cerebral palsy, COPD, cognitive impairment, schizoaffective presents to the emergency department with complaints of feeling suicidal and homicidal.  Patient is extremely well-known to the emergency department with multiple visits with similar presentations.  Patient states she hates Steward Drone at her group home and wants to stab her with a knife.  States she is going to go to [place] and get a knife and stabbed [place] and then stab her self.  Patient has no medical complaints today.  No cough congestion fever recent travel.  Past Psychiatric History:  Anxiety Borderline personality disorder (HCC) Depression Mild cognitive impairment Schizoaffective disorder (HCC)  Risk to Self:  No Risk to Others:  No Prior Inpatient Therapy:  Yes Prior Outpatient Therapy:  Yes  Past Medical History:  Past Medical History:  Diagnosis Date  . Anxiety   . Asthma   . Borderline personality disorder (HCC)   . Cerebral palsy (HCC)   . COPD (chronic obstructive pulmonary disease) (HCC)   . Depression   . GERD (gastroesophageal reflux disease)   . Mild cognitive impairment   . Schizoaffective disorder (HCC)   . Wound abscess  Past Surgical History:  Procedure Laterality Date  . CHOLECYSTECTOMY    . INCISION AND DRAINAGE ABSCESS Right 01/02/2015   Procedure: INCISION AND DRAINAGE ABSCESS;  Surgeon: Tiney Rougealph Ely III, MD;  Location: [facility] ORS;  Service: General;  Laterality: Right;  . TONSILLECTOMY     Family History:  Family History  Problem Relation Age  of Onset  . Heart attack Father   . Diabetes Mother    Family Psychiatric  History: None given Social History:  Social History   Substance and Sexual Activity  Alcohol Use No     Social History   Substance and Sexual Activity  Drug Use No    Social History   Socioeconomic History  . Marital status: Single    Spouse name: Not on file  . Number of children: Not on file  . Years of education: Not on file  . Highest education level: Not on file  Occupational History  . Occupation: Disabled  Social Needs  . Financial resource strain: Not on file  . Food insecurity:    Worry: Not on file    Inability: Not on file  . Transportation needs:    Medical: Not on file    Non-medical: Not on file  Tobacco Use  . Smoking status: Former Games developermoker  . Smokeless tobacco: Never Used  Substance and Sexual Activity  . Alcohol use: No  . Drug use: No  . Sexual activity: Not Currently  Lifestyle  . Physical activity:    Days per week: Not on file    Minutes per session: Not on file  . Stress: Not on file  Relationships  . Social connections:    Talks on phone: Not on file    Gets together: Not on file    Attends religious service: Not on file    Active member of club or organization: Not on file    Attends meetings of clubs or organizations: Not on file    Relationship status: Not on file  Other Topics Concern  . Not on file  Social History Narrative   Pt lives with mother, nephew, sister in law   Additional Social History:    Allergies:   Allergies  Allergen Reactions  . Penicillins Nausea And Vomiting and Other (See Comments)    Has patient had a PCN reaction causing immediate rash, facial/tongue/throat swelling, SOB or lightheadedness with hypotension: No Has patient had a PCN reaction causing severe rash involving mucus membranes or skin necrosis: No Has patient had a PCN reaction that required hospitalization No Has patient had a PCN reaction occurring within the last  10 years: No If all of the above answers are "NO", then may proceed with Cephalosporin use      Labs: No results found for this or any previous visit (from the past 48 hour(s)).  No current facility-administered medications for this encounter.    Current Outpatient Medications  Medication Sig Dispense Refill  . albuterol (PROVENTIL HFA;VENTOLIN HFA) 108 (90 Base) MCG/ACT inhaler Inhale 2 puffs into the lungs every 4 (four) hours as needed for wheezing or shortness of breath. (Patient not taking: Reported on 06/25/2018) 1 Inhaler 0  . aspirin EC 81 MG tablet Take 1 tablet (81 mg total) by mouth daily. (Patient not taking: Reported on 07/26/2018) 30 tablet 1  . clobetasol cream (TEMOVATE) 0.05 % Apply 1 application topically 2 (two) times daily. (Patient not taking: Reported on 07/26/2018) 30 g 0  . clonazePAM (KLONOPIN) 0.5 MG tablet Take 1 tablet (  0.5 mg total) by mouth 2 (two) times daily. 60 tablet 1  . divalproex (DEPAKOTE) 500 MG DR tablet Take 1 tablet (500 mg total) by mouth 2 (two) times daily. 60 tablet 1  . escitalopram (LEXAPRO) 20 MG tablet Take 1 tablet (20 mg total) by mouth daily. 30 tablet 1  . famotidine (PEPCID) 20 MG tablet Take 1 tablet (20 mg total) by mouth 2 (two) times daily. 30 tablet 0  . fenofibrate 160 MG tablet Take 1 tablet (160 mg total) by mouth daily. 30 tablet 1  . hydrOXYzine (ATARAX/VISTARIL) 25 MG tablet Take 1 tablet (25 mg total) by mouth every 6 (six) hours as needed for anxiety. (Patient taking differently: Take 25 mg by mouth 3 (three) times daily. ) 12 tablet 0  . levothyroxine (SYNTHROID, LEVOTHROID) 50 MCG tablet Take 1 tablet (50 mcg total) by mouth daily before breakfast. 30 tablet 1  . oxybutynin (DITROPAN-XL) 5 MG 24 hr tablet Take 1 tablet (5 mg total) by mouth daily. 30 tablet 1  . pantoprazole (PROTONIX) 40 MG tablet Take 1 tablet (40 mg total) by mouth daily. 30 tablet 1  . risperiDONE (RISPERDAL) 3 MG tablet Take 1 tablet (3 mg total) by mouth  2 (two) times daily. 60 tablet 1  . sucralfate (CARAFATE) 1 g tablet Take 1 tablet (1 g total) by mouth 4 (four) times daily -  with meals and at bedtime. (Patient taking differently: Take 1 g by mouth 3 (three) times daily. ) 120 tablet 1    Musculoskeletal: Strength & Muscle Tone: within normal limits Gait & Station: normal Patient leans: N/A  Psychiatric Specialty Exam: Physical Exam  Nursing note and vitals reviewed. Constitutional: She is oriented to person, place, and time. She appears well-developed and well-nourished.  HENT:  Head: Normocephalic and atraumatic.  Eyes: Pupils are equal, round, and reactive to light. Conjunctivae and EOM are normal.  Neck: Normal range of motion. Neck supple.  Cardiovascular: Normal rate and regular rhythm.  Respiratory: Effort normal and breath sounds normal.  Musculoskeletal: Normal range of motion.  Neurological: She is alert and oriented to person, place, and time.  Skin: Skin is warm and dry.    Review of Systems  Constitutional: Negative.   HENT: Negative.   Eyes: Negative.   Respiratory: Negative.   Cardiovascular: Negative.   Gastrointestinal: Negative.   Genitourinary: Negative.   Musculoskeletal: Negative.   Skin: Negative.   Neurological: Negative.   Endo/Heme/Allergies: Negative.   Psychiatric/Behavioral: Positive for depression and suicidal ideas. The patient is nervous/anxious.     Blood pressure 107/76, pulse 84, temperature 97.9 F (36.6 C), temperature source Oral, resp. rate 18, height  (1.6 m), weight 95 kg, last menstrual period 05/28/2018, SpO2 94 %.Body mass index is 37.1 kg/m.  General Appearance: Disheveled  Eye Contact:  Minimal  Speech:  Garbled and Slow  Volume:  Decreased  Mood:  Depressed and Irritable  Affect:  Depressed and Flat  Thought Process:  Goal Directed  Orientation:  Full (Time, Place, and Person)  Thought Content:  Illogical  Suicidal Thoughts:  Yes.  without intent/plan  Homicidal  Thoughts:  No  Memory:  Recent;   Good  Judgement:  Fair  Insight:  Lacking  Psychomotor Activity:  Normal  Concentration:  Concentration: Fair  Recall:  [employer] of Knowledge:  Fair  Language:  Fair  Akathisia:  NA  Handed:  Right  AIMS (if indicated):   N/A  Assets:  Desire for Improvement  Housing Social Support  ADL's:  Intact  Cognition:  Impaired,  Mild  Sleep:   Well     Treatment Plan Summary: Daily contact with patient to assess and evaluate symptoms and progress in treatment, Medication management and Plan The patient does not meet criteria to be admitted into a psychiatric inpatient unit  Disposition: No evidence of imminent risk to self or others at present.   Patient does not meet criteria for psychiatric inpatient admission. Supportive therapy provided about ongoing stressors.  Catalina Gravel, NP 08/01/2018 11:11 PM

## 2018-08-01 NOTE — ED Triage Notes (Signed)
Pt here with [employer] voluntarily. Pt allegedly stated that she wanted to stab staff members and she would be better off dead.

## 2018-08-01 NOTE — ED Notes (Signed)
Report to include Situation, Background, Assessment, and Recommendations received from [facility]. Patient alert and oriented, warm and dry, in no acute distress. Patient reported SI, HI, AVH and pain. Patient made aware of Q15 minute rounds and Psychologist, counselling presence for their safety. Patient instructed to come to me with needs or concerns.

## 2018-08-01 NOTE — ED Notes (Signed)
Patient assigned to appropriate care area   Introduced self to pt  Patient oriented to unit/care area: Informed that, for their safety, care areas are designed for safety and visiting and phone hours explained to patient. Patient verbalizes understanding, and verbal contract for safety obtained  Environment secured   Pt here with [employer] voluntarily. Pt allegedly stated that she wanted to stab staff members and she would be better off dead.

## 2018-08-02 DIAGNOSIS — F4325 Adjustment disorder with mixed disturbance of emotions and conduct: Secondary | ICD-10-CM | POA: Diagnosis not present

## 2018-08-02 DIAGNOSIS — G3184 Mild cognitive impairment, so stated: Secondary | ICD-10-CM | POA: Diagnosis not present

## 2018-08-02 LAB — ACETAMINOPHEN LEVEL: Acetaminophen (Tylenol), Serum: <10 ug/mL — ABNORMAL LOW (ref 10–30)

## 2018-08-02 LAB — SALICYLATE LEVEL: Salicylate Lvl: <7 mg/dL (ref 2.8–30.0)

## 2018-08-02 LAB — LITHIUM LEVEL: Lithium Lvl: <0.06 mmol/L — ABNORMAL LOW (ref 0.60–1.20)

## 2018-08-02 LAB — ETHANOL: Alcohol, Ethyl (B): <10 mg/dL (ref <10)

## 2018-08-02 MED ORDER — HYDROXYZINE HCL 25 MG PO TABS
25.0000 mg | ORAL_TABLET | Freq: Three times a day (TID) | ORAL | Status: DC
  Administered 2018-08-02 – 2018-08-06 (×10): 25 mg via ORAL
  Filled 2018-08-02 (×12): qty 1

## 2018-08-02 MED ORDER — LORAZEPAM 1 MG PO TABS: 1.0000 mg | ORAL_TABLET | Freq: Once | ORAL | Status: DC

## 2018-08-02 MED ORDER — OXYBUTYNIN CHLORIDE ER 5 MG PO TB24
5.0000 mg | ORAL_TABLET | Freq: Every day | ORAL | Status: DC
  Administered 2018-08-02 – 2018-08-07 (×6): 5 mg via ORAL
  Filled 2018-08-02 (×9): qty 1

## 2018-08-02 MED ORDER — ESCITALOPRAM OXALATE 10 MG PO TABS
20.0000 mg | ORAL_TABLET | Freq: Every day | ORAL | Status: DC
  Administered 2018-08-02 – 2018-08-06 (×5): 20 mg via ORAL
  Filled 2018-08-02 (×5): qty 2

## 2018-08-02 MED ORDER — PANTOPRAZOLE SODIUM 40 MG PO TBEC
40.0000 mg | DELAYED_RELEASE_TABLET | Freq: Every day | ORAL | Status: DC
  Administered 2018-08-02 – 2018-08-09 (×8): 40 mg via ORAL
  Filled 2018-08-02 (×8): qty 1

## 2018-08-02 MED ORDER — OLANZAPINE 5 MG PO TABS
5.0000 mg | ORAL_TABLET | Freq: Three times a day (TID) | ORAL | Status: DC | PRN
  Administered 2018-08-05: 10:00:00 5 mg via ORAL
  Filled 2018-08-02 (×2): qty 1

## 2018-08-02 MED ORDER — SUCRALFATE 1 G PO TABS
1.0000 g | ORAL_TABLET | Freq: Three times a day (TID) | ORAL | Status: DC
  Administered 2018-08-03 – 2018-08-09 (×22): 1 g via ORAL
  Filled 2018-08-02 (×36): qty 1

## 2018-08-02 MED ORDER — RISPERIDONE 3 MG PO TABS
3.0000 mg | ORAL_TABLET | Freq: Two times a day (BID) | ORAL | Status: DC
  Administered 2018-08-02 – 2018-08-06 (×8): 3 mg via ORAL
  Filled 2018-08-02: qty 3
  Filled 2018-08-02 (×5): qty 1
  Filled 2018-08-02: qty 3
  Filled 2018-08-02 (×2): qty 1

## 2018-08-02 MED ORDER — LEVOTHYROXINE SODIUM 25 MCG PO TABS
50.0000 ug | ORAL_TABLET | Freq: Every day | ORAL | Status: DC
  Administered 2018-08-04 – 2018-08-09 (×6): 50 ug via ORAL
  Filled 2018-08-02 (×5): qty 2

## 2018-08-02 MED ORDER — FAMOTIDINE 20 MG PO TABS
20.0000 mg | ORAL_TABLET | Freq: Two times a day (BID) | ORAL | Status: DC
  Administered 2018-08-02 – 2018-08-09 (×14): 20 mg via ORAL
  Filled 2018-08-02 (×15): qty 1

## 2018-08-02 MED ORDER — CLONAZEPAM 0.5 MG PO TABS
0.5000 mg | ORAL_TABLET | Freq: Two times a day (BID) | ORAL | Status: DC
  Administered 2018-08-03 – 2018-08-06 (×7): 0.5 mg via ORAL
  Filled 2018-08-02 (×8): qty 1

## 2018-08-02 MED ORDER — FENOFIBRATE 160 MG PO TABS
160.0000 mg | ORAL_TABLET | Freq: Every day | ORAL | Status: DC
  Administered 2018-08-02 – 2018-08-09 (×8): 160 mg via ORAL
  Filled 2018-08-02 (×11): qty 1

## 2018-08-02 MED ORDER — DIVALPROEX SODIUM 500 MG PO DR TAB
500.0000 mg | DELAYED_RELEASE_TABLET | Freq: Two times a day (BID) | ORAL | Status: DC
  Administered 2018-08-02 – 2018-08-06 (×8): 500 mg via ORAL
  Filled 2018-08-02 (×9): qty 1

## 2018-08-02 NOTE — ED Notes (Signed)
Patient refusing to get dressed on her own, patient went in the bathroom and her clothing was wet from urinating on herself, patient kept screaming out to staff "I need help". Patient stayed in bathroom about half hour and calling staff "bitches, I need help"  Patient then was able to put her clothes on by herself, patient also urinated on her bed and was upset that staff asked her to help change her linens.

## 2018-08-02 NOTE — ED Notes (Signed)
Hourly rounding reveals patient sleeping in room. No complaints, stable, in no acute distress. Q15 minute rounds and monitoring via Security Cameras to continue. 

## 2018-08-02 NOTE — Consult Note (Signed)
[facility] Psych ED Discharge  08/02/2018 4:01 PM [name]  MRN:  [id number] Principal Problem: Adjustment disorder with mixed disturbance of emotions and conduct Discharge Diagnoses: Principal Problem:   Adjustment disorder with mixed disturbance of emotions and conduct  Plan:  Continuing to contact her care coordinator [name]) regarding placement with her family or a group home, discharged from her group home today as her 30 day notices was complete.  Dr Lucianne Muss reviewed this patient and concurs with the plan.    Assessment: Patient got upset at her group home and stated she wanted to hurt herself and others but no actions.  She is well known to this ED and providers for similar presentations, multiple in the past few weeks, when she gets upset.  Caveat:  Cognitive impairment with low low threshold for frustration. Chronicity of suicidal/homicidal ideations with no intent. On assessment, she denies current suicidal ideations.  Unfortunately, she did not like living with her family either.  No hallucinations or homicidal ideations or substance abuse.  Her suicidal ideations are initiated with stress and reduced with social support.  She does have a care coordinator.  Group home has given her a 30-day notice at this time.  Stable for transfer to family or a group home at this time, [name] is at her baseline  Total Time spent with patient: 30 minutes  Past Psychiatric History: schizoaffective disorder  Past Medical History:  Past Medical History:  Diagnosis Date  . Anxiety   . Asthma   . Borderline personality disorder (HCC)   . Cerebral palsy (HCC)   . COPD (chronic obstructive pulmonary disease) (HCC)   . Depression   . GERD (gastroesophageal reflux disease)   . Mild cognitive impairment   . Schizoaffective disorder (HCC)   . Wound abscess     Past Surgical History:  Procedure Laterality Date  . CHOLECYSTECTOMY    . INCISION AND DRAINAGE ABSCESS Right 01/02/2015   Procedure:  INCISION AND DRAINAGE ABSCESS;  Surgeon: Tiney Rouge III, MD;  Location: [facility] ORS;  Service: General;  Laterality: Right;  . TONSILLECTOMY     Family History:  Family History  Problem Relation Age of Onset  . Heart attack Father   . Diabetes Mother    Family Psychiatric  History: none Social History:  Social History   Substance and Sexual Activity  Alcohol Use No     Social History   Substance and Sexual Activity  Drug Use No    Social History   Socioeconomic History  . Marital status: Single    Spouse name: Not on file  . Number of children: Not on file  . Years of education: Not on file  . Highest education level: Not on file  Occupational History  . Occupation: Disabled  Social Needs  . Financial resource strain: Not on file  . Food insecurity:    Worry: Not on file    Inability: Not on file  . Transportation needs:    Medical: Not on file    Non-medical: Not on file  Tobacco Use  . Smoking status: Former Games developer  . Smokeless tobacco: Never Used  Substance and Sexual Activity  . Alcohol use: No  . Drug use: No  . Sexual activity: Not Currently  Lifestyle  . Physical activity:    Days per week: Not on file    Minutes per session: Not on file  . Stress: Not on file  Relationships  . Social connections:    Talks on phone: Not  on file    Gets together: Not on file    Attends religious service: Not on file    Active member of club or organization: Not on file    Attends meetings of clubs or organizations: Not on file    Relationship status: Not on file  Other Topics Concern  . Not on file  Social History Narrative   Pt lives with mother, nephew, sister in law    Has this patient used any form of tobacco in the last 30 days? (Cigarettes, Smokeless Tobacco, Cigars, and/or Pipes) NA  Current Medications: Current Facility-Administered Medications  Medication Dose Route Frequency Provider Last Rate Last Dose  . clonazePAM (KLONOPIN) tablet 0.5 mg  0.5 mg  Oral BID Charm Rings, NP      . divalproex (DEPAKOTE) DR tablet 500 mg  500 mg Oral BID Charm Rings, NP   500 mg at 08/02/18 1313  . escitalopram (LEXAPRO) tablet 20 mg  20 mg Oral Daily Charm Rings, NP   20 mg at 08/02/18 1312  . famotidine (PEPCID) tablet 20 mg  20 mg Oral BID Charm Rings, NP   20 mg at 08/02/18 1312  . fenofibrate tablet 160 mg  160 mg Oral Daily Charm Rings, NP   160 mg at 08/02/18 1500  . hydrOXYzine (ATARAX/VISTARIL) tablet 25 mg  25 mg Oral TID Charm Rings, NP   25 mg at 08/02/18 1500  . [START ON 08/03/2018] levothyroxine (SYNTHROID) tablet 50 mcg  50 mcg Oral QAC breakfast Charm Rings, NP      . LORazepam (ATIVAN) tablet 1 mg  1 mg Oral Once Charm Rings, NP   Stopped at 08/02/18 1537  . OLANZapine (ZYPREXA) tablet 5 mg  5 mg Oral Q8H PRN Charm Rings, NP      . oxybutynin (DITROPAN-XL) 24 hr tablet 5 mg  5 mg Oral Daily Charm Rings, NP   5 mg at 08/02/18 1500  . pantoprazole (PROTONIX) EC tablet 40 mg  40 mg Oral Daily Charm Rings, NP   40 mg at 08/02/18 1312  . risperiDONE (RISPERDAL) tablet 3 mg  3 mg Oral BID Charm Rings, NP   3 mg at 08/02/18 1313  . sucralfate (CARAFATE) tablet 1 g  1 g Oral TID WC & HS Charm Rings, NP       Current Outpatient Medications  Medication Sig Dispense Refill  . clonazePAM (KLONOPIN) 0.5 MG tablet Take 1 tablet (0.5 mg total) by mouth 2 (two) times daily. 60 tablet 1  . divalproex (DEPAKOTE) 500 MG DR tablet Take 1 tablet (500 mg total) by mouth 2 (two) times daily. 60 tablet 1  . escitalopram (LEXAPRO) 20 MG tablet Take 1 tablet (20 mg total) by mouth daily. 30 tablet 1  . famotidine (PEPCID) 20 MG tablet Take 1 tablet (20 mg total) by mouth 2 (two) times daily. 30 tablet 0  . fenofibrate 160 MG tablet Take 1 tablet (160 mg total) by mouth daily. 30 tablet 1  . hydrOXYzine (ATARAX/VISTARIL) 25 MG tablet Take 1 tablet (25 mg total) by mouth every 6 (six) hours as needed for anxiety.  (Patient taking differently: Take 25 mg by mouth 3 (three) times daily. ) 12 tablet 0  . levothyroxine (SYNTHROID, LEVOTHROID) 50 MCG tablet Take 1 tablet (50 mcg total) by mouth daily before breakfast. 30 tablet 1  . oxybutynin (DITROPAN-XL) 5 MG 24 hr tablet Take 1 tablet (5  mg total) by mouth daily. 30 tablet 1  . pantoprazole (PROTONIX) 40 MG tablet Take 1 tablet (40 mg total) by mouth daily. 30 tablet 1  . risperiDONE (RISPERDAL) 3 MG tablet Take 1 tablet (3 mg total) by mouth 2 (two) times daily. 60 tablet 1  . sucralfate (CARAFATE) 1 g tablet Take 1 tablet (1 g total) by mouth 4 (four) times daily -  with meals and at bedtime. (Patient taking differently: Take 1 g by mouth 3 (three) times daily. ) 120 tablet 1   PTA Medications: (Not in a hospital admission)   Musculoskeletal: Strength & Muscle Tone: within normal limits Gait & Station: normal Patient leans: N/A  Psychiatric Specialty Exam: Physical Exam  Nursing note and vitals reviewed. Constitutional: She is oriented to person, place, and time. She appears well-developed and well-nourished.  HENT:  Head: Normocephalic.  Neck: Normal range of motion.  Respiratory: Effort normal.  Musculoskeletal: Normal range of motion.  Neurological: She is alert and oriented to person, place, and time.  Psychiatric: Her speech is normal and behavior is normal. Judgment and thought content normal. Her mood appears anxious. Her affect is blunt. Cognition and memory are impaired.    Review of Systems  Psychiatric/Behavioral: The patient is nervous/anxious.   All other systems reviewed and are negative.   Blood pressure 107/76, pulse 84, temperature 97.9 F (36.6 C), temperature source Oral, resp. rate 18, height 5\' 3"  (1.6 m), weight 95 kg, last menstrual period 05/28/2018, SpO2 94 %.Body mass index is 37.1 kg/m.  General Appearance: Casual  Eye Contact:  Good  Speech:  Normal Rate  Volume:  Normal  Mood:  Anxious  Affect:  Blunt   Thought Process:  Coherent and Descriptions of Associations: Intact  Orientation:  Full (Time, Place, and Person)  Thought Content:  WDL and Logical  Suicidal Thoughts:  No  Homicidal Thoughts:  No  Memory:  Immediate;   Good Recent;   Good Remote;   Good  Judgement:  Poor  Insight:  Fair  Psychomotor Activity:  Normal  Concentration:  Concentration: Good and Attention Span: Good  Recall:  [employer]  Fund of Knowledge:  Fair  Language:  Good  Akathisia:  No  Handed:  Right  AIMS (if indicated):   N/A  Assets:  Housing Leisure Time Physical Health Resilience Social Support  ADL's:  Intact  Cognition:  WNL  Sleep:   N/A     Demographic Factors:  Caucasian  Loss Factors: NA  Historical Factors: Impulsivity  Risk Reduction Factors:   Sense of responsibility to family, Living with another person, especially a relative, Positive social support and Positive therapeutic relationship  Continued Clinical Symptoms:  Anxiety, mild  Cognitive Features That Contribute To Risk:  None    Suicide Risk:  Minimal: No identifiable suicidal ideation.  Patients presenting with no risk factors but with morbid ruminations; may be classified as minimal risk based on the severity of the depressive symptoms   Plan Of Care/Follow-up recommendations:  Adjustment disorder with mixed disturbance of emotions and conduct: -Restarted Klonopin 0.5 mg BID -Restarted Vistaril 25 mg every 8 hours PRN anxiety  Schizoaffective disorder, depressive disorder: -Restarted Lexapro 20 mg daily -Restarted Depakote 500 mg BID -Continued Risperdal 3 mg BID -Contacted her ACT team and care coordinator for update and coordination of care  Asthma: -Started albuterol 2 puffs every hours Activity:  as tolerated Diet:  heart healthy diet  Disposition: discharge to her family or group home  LORD, JAMISON,  NP 08/02/2018, 4:01 PM   Patient seen face-to-face for psychiatric evaluation, chart reviewed and  case discussed with the physician extender and developed treatment plan. Reviewed the information documented and agree with the treatment plan.

## 2018-08-02 NOTE — ED Notes (Signed)
Attempted to give medications.  Pt. Currently sleeping, vitals normal.  Will continue to monitor.

## 2018-08-02 NOTE — ED Provider Notes (Signed)
-----------------------------------------   7:54 AM on 08/02/2018 -----------------------------------------   Blood pressure 107/76, pulse 84, temperature 97.9 F (36.6 C), temperature source Oral, resp. rate 18, height 5\' 3"  (1.6 m), weight 95 kg, last menstrual period 05/28/2018, SpO2 94 %.  The patient is calm and cooperative at this time.  There have been no acute events since the last update.  Awaiting disposition plan from Behavioral Medicine team.    Arnaldo Natal, MD 08/02/18 [phone]

## 2018-08-02 NOTE — ED Notes (Signed)
Patient used the phone to call 911 and ask them to come and get her and take her to [facility], Clinical research associate notified by ED Naval architect. Discussed with client and she said I am ready to leave, when asked why she said because you all are being mean to me, when asked her how she said "you know", writer asked if its because we made her dress herself and she said yes and because I cant leave.  Discuss with client, staff is trying to assist in finding her somewhere to live, client stated I have somewhere to go but wouldn't tell staff. Discussed with client if she abuses the phone privileges we cant keep letting her use the phone to call 911, patient said ok

## 2018-08-02 NOTE — ED Notes (Signed)
Pt. Currently sleeping in bed. 

## 2018-08-02 NOTE — ED Notes (Signed)
Patient came to the bathroom door naked and asked staff to help her put her clothes back on, staff let patient know she can do it on her own, patient said "fuck you" I cant do it, I need help. Writer informed patient that she cant curse at staff and then want help, patient shut the bathroom door and stayed in the bathroom until she was able to put scrubs back on.

## 2018-08-03 DIAGNOSIS — F4325 Adjustment disorder with mixed disturbance of emotions and conduct: Secondary | ICD-10-CM | POA: Diagnosis not present

## 2018-08-03 DIAGNOSIS — G3184 Mild cognitive impairment, so stated: Secondary | ICD-10-CM | POA: Diagnosis not present

## 2018-08-03 NOTE — ED Notes (Signed)
Pt. Currently sleeping in bed with lights turned off.

## 2018-08-03 NOTE — ED Provider Notes (Signed)
-----------------------------------------   7:26 AM on 08/03/2018 -----------------------------------------   Blood pressure (!) 108/55, pulse 62, temperature 97.7 F (36.5 C), temperature source Axillary, resp. rate 16, height 5\' 3"  (1.6 m), weight 95 kg, last menstrual period 05/28/2018, SpO2 97 %.  The patient is calm and cooperative at this time.  There have been no acute events since the last update.  Awaiting disposition plan from Behavioral Medicine team.   Dionne Bucy, MD 08/03/18 [phone]

## 2018-08-03 NOTE — Consult Note (Signed)
[facility] Psych ED Discharge  08/03/2018 10:03 AM [name]  MRN:  [id number] Principal Problem: Adjustment disorder with mixed disturbance of emotions and conduct Discharge Diagnoses: Principal Problem:   Adjustment disorder with mixed disturbance of emotions and conduct  Plan:  Continuing to contact her care coordinator [name]) regarding placement with her family or a group home, discharged from her group home today as her 30 day notices was complete. She does not meet criteria for inpatient psychiatric hospitalization at this time.  Assessment: Reviewed the patient's previous notes, noted that she presented to the hospital after becoming frustrated that she no longer has housing as her group home has evicted her after a 30 day notice. According to her chart the patient has multiple similar presentations, which appear to thematically revolve around reported suicidality in the setting of poor frustration tolerance, intellectual disability and psychosocial stressors. She is currently denying suicidal or homicidal ideations and is fixated on finding somewhere to live. She requested to speak to a Child psychotherapist regarding assistance in placement. She denies AVH at this time. Reportedly this is her baseline, she does not appear to warrant inpatient psychiatric hospitalization at this time and I will rescind her IVC so that SW can work with her and her ACT team for community placement.  Total Time spent with patient: 30 minutes  Past Psychiatric History: schizoaffective disorder, intellectual disability  Past Medical History:  Past Medical History:  Diagnosis Date  . Anxiety   . Asthma   . Borderline personality disorder (HCC)   . Cerebral palsy (HCC)   . COPD (chronic obstructive pulmonary disease) (HCC)   . Depression   . GERD (gastroesophageal reflux disease)   . Mild cognitive impairment   . Schizoaffective disorder (HCC)   . Wound abscess     Past Surgical History:  Procedure  Laterality Date  . CHOLECYSTECTOMY    . INCISION AND DRAINAGE ABSCESS Right 01/02/2015   Procedure: INCISION AND DRAINAGE ABSCESS;  Surgeon: Tiney Rouge III, MD;  Location: [facility] ORS;  Service: General;  Laterality: Right;  . TONSILLECTOMY     Family History:  Family History  Problem Relation Age of Onset  . Heart attack Father   . Diabetes Mother    Family Psychiatric  History: none Social History:  Social History   Substance and Sexual Activity  Alcohol Use No     Social History   Substance and Sexual Activity  Drug Use No    Social History   Socioeconomic History  . Marital status: Single    Spouse name: Not on file  . Number of children: Not on file  . Years of education: Not on file  . Highest education level: Not on file  Occupational History  . Occupation: Disabled  Social Needs  . Financial resource strain: Not on file  . Food insecurity:    Worry: Not on file    Inability: Not on file  . Transportation needs:    Medical: Not on file    Non-medical: Not on file  Tobacco Use  . Smoking status: Former Games developer  . Smokeless tobacco: Never Used  Substance and Sexual Activity  . Alcohol use: No  . Drug use: No  . Sexual activity: Not Currently  Lifestyle  . Physical activity:    Days per week: Not on file    Minutes per session: Not on file  . Stress: Not on file  Relationships  . Social connections:    Talks on phone: Not on file  Gets together: Not on file    Attends religious service: Not on file    Active member of club or organization: Not on file    Attends meetings of clubs or organizations: Not on file    Relationship status: Not on file  Other Topics Concern  . Not on file  Social History Narrative   Pt lives with mother, nephew, sister in law    Has this patient used any form of tobacco in the last 30 days? (Cigarettes, Smokeless Tobacco, Cigars, and/or Pipes) NA  Current Medications: Current Facility-Administered Medications   Medication Dose Route Frequency Provider Last Rate Last Dose  . clonazePAM (KLONOPIN) tablet 0.5 mg  0.5 mg Oral BID Charm RingsLord, Jamison Y, NP   Stopped at 08/02/18 2200  . divalproex (DEPAKOTE) DR tablet 500 mg  500 mg Oral BID Charm RingsLord, Jamison Y, NP   Stopped at 08/02/18 2200  . escitalopram (LEXAPRO) tablet 20 mg  20 mg Oral Daily Charm RingsLord, Jamison Y, NP   20 mg at 08/02/18 1312  . famotidine (PEPCID) tablet 20 mg  20 mg Oral BID Charm RingsLord, Jamison Y, NP   Stopped at 08/02/18 2200  . fenofibrate tablet 160 mg  160 mg Oral Daily Charm RingsLord, Jamison Y, NP   160 mg at 08/02/18 1500  . hydrOXYzine (ATARAX/VISTARIL) tablet 25 mg  25 mg Oral TID Charm RingsLord, Jamison Y, NP   Stopped at 08/02/18 2200  . levothyroxine (SYNTHROID) tablet 50 mcg  50 mcg Oral QAC breakfast Charm RingsLord, Jamison Y, NP      . OLANZapine [facility](ZYPREXA) tablet 5 mg  5 mg Oral Q8H PRN Charm RingsLord, Jamison Y, NP      . oxybutynin (DITROPAN-XL) 24 hr tablet 5 mg  5 mg Oral Daily Charm RingsLord, Jamison Y, NP   5 mg at 08/02/18 1500  . pantoprazole (PROTONIX) EC tablet 40 mg  40 mg Oral Daily Charm RingsLord, Jamison Y, NP   40 mg at 08/02/18 1312  . risperiDONE (RISPERDAL) tablet 3 mg  3 mg Oral BID Charm RingsLord, Jamison Y, NP   Stopped at 08/02/18 2200  . sucralfate (CARAFATE) tablet 1 g  1 g Oral TID WC & HS Charm RingsLord, Jamison Y, NP   1 g at 08/03/18 0825   Current Outpatient Medications  Medication Sig Dispense Refill  . clonazePAM (KLONOPIN) 0.5 MG tablet Take 1 tablet (0.5 mg total) by mouth 2 (two) times daily. 60 tablet 1  . divalproex (DEPAKOTE) 500 MG DR tablet Take 1 tablet (500 mg total) by mouth 2 (two) times daily. 60 tablet 1  . escitalopram (LEXAPRO) 20 MG tablet Take 1 tablet (20 mg total) by mouth daily. 30 tablet 1  . famotidine (PEPCID) 20 MG tablet Take 1 tablet (20 mg total) by mouth 2 (two) times daily. 30 tablet 0  . fenofibrate 160 MG tablet Take 1 tablet (160 mg total) by mouth daily. 30 tablet 1  . hydrOXYzine (ATARAX/VISTARIL) 25 MG tablet Take 1 tablet (25 mg total) by mouth every  6 (six) hours as needed for anxiety. (Patient taking differently: Take 25 mg by mouth 3 (three) times daily. ) 12 tablet 0  . levothyroxine (SYNTHROID, LEVOTHROID) 50 MCG tablet Take 1 tablet (50 mcg total) by mouth daily before breakfast. 30 tablet 1  . oxybutynin (DITROPAN-XL) 5 MG 24 hr tablet Take 1 tablet (5 mg total) by mouth daily. 30 tablet 1  . pantoprazole (PROTONIX) 40 MG tablet Take 1 tablet (40 mg total) by mouth daily. 30 tablet 1  .  risperiDONE (RISPERDAL) 3 MG tablet Take 1 tablet (3 mg total) by mouth 2 (two) times daily. 60 tablet 1  . sucralfate (CARAFATE) 1 g tablet Take 1 tablet (1 g total) by mouth 4 (four) times daily -  with meals and at bedtime. (Patient taking differently: Take 1 g by mouth 3 (three) times daily. ) 120 tablet 1   PTA Medications: (Not in a hospital admission)   Musculoskeletal: Strength & Muscle Tone: within normal limits Gait & Station: normal Patient leans: N/A  Psychiatric Specialty Exam: Physical Exam  Nursing note and vitals reviewed. Constitutional: She is oriented to person, place, and time. She appears well-developed and well-nourished.  HENT:  Head: Normocephalic.  Neck: Normal range of motion.  Respiratory: Effort normal.  Musculoskeletal: Normal range of motion.  Neurological: She is alert and oriented to person, place, and time.  Psychiatric: Her speech is normal and behavior is normal. Judgment and thought content normal. Her mood appears anxious. Her affect is blunt. Cognition and memory are impaired.    Review of Systems  Psychiatric/Behavioral: The patient is nervous/anxious.   All other systems reviewed and are negative.   Blood pressure (!) 108/55, pulse 62, temperature 97.7 F (36.5 C), temperature source Axillary, resp. rate 16, height 5\' 3"  (1.6 m), weight 95 kg, last menstrual period 05/28/2018, SpO2 97 %.Body mass index is 37.1 kg/m.  General Appearance: Casual  Eye Contact:  Good  Speech:  Slow  Volume:  Normal   Mood:  Worried  Affect:  Blunt  Thought Process:  Coherent and Descriptions of Associations: Intact  Orientation:  Full (Time, Place, and Person)  Thought Content:  WDL and Logical  Suicidal Thoughts:  No  Homicidal Thoughts:  No  Memory:  Immediate;   Good Recent;   Good Remote;   Good  Judgement:  Other:  Limited  Insight:  Shallow  Psychomotor Activity:  Normal  Concentration:  Concentration: Good and Attention Span: Good  Recall:  [employer] of Knowledge:  Fair  Language:  Good  Akathisia:  No  Handed:  Right  AIMS (if indicated):   N/A  Assets:  Housing Leisure Time Physical Health Resilience Social Support  ADL's:  Intact  Cognition:  WNL  Sleep:   N/A     Demographic Factors:  Caucasian  Loss Factors: NA  Historical Factors: Impulsivity  Risk Reduction Factors:   Sense of responsibility to family, Living with another person, especially a relative, Positive social support and Positive therapeutic relationship  Continued Clinical Symptoms:  Anxiety, mild  Cognitive Features That Contribute To Risk:  None    Suicide Risk:  Minimal: No identifiable suicidal ideation.  Patients presenting with no risk factors but with morbid ruminations; may be classified as minimal risk based on the severity of the depressive symptoms   Plan Of Care/Follow-up recommendations:   Adjustment disorder with mixed disturbance of emotions and conduct: -continue Klonopin 0.5 mg BID -continue Vistaril 25 mg every 8 hours PRN anxiety  Schizoaffective disorder, depressive disorder: -continue Lexapro 20 mg daily -continue Depakote 500 mg BID -continue Risperdal 3 mg BID  Asthma: -albuterol 2 puffs every hours Activity:  as tolerated Diet:  heart healthy diet  Disposition: Would recommend working with her ACT team/Social work for placement in another group home setting  Luciano Cutter, DO 08/03/2018, 10:03 AM

## 2018-08-03 NOTE — ED Notes (Signed)
IVC 

## 2018-08-03 NOTE — ED Notes (Signed)
Pt given breakfast tray

## 2018-08-03 NOTE — ED Notes (Signed)
Pt given lunch tray.

## 2018-08-03 NOTE — ED Notes (Signed)
Pt had episode of incontinence. Bed wiped cleaned and sheets changed. Pt refused shower. Washed up in bathroom. Given new scrubs.

## 2018-08-03 NOTE — ED Notes (Signed)
Woke up patient so bed sheets could be changed.  Pt. Went to bathroom to change into clean scrubs.  Patients bed linens changed.

## 2018-08-03 NOTE — ED Notes (Signed)
Pt given dinner tray.

## 2018-08-04 DIAGNOSIS — G3184 Mild cognitive impairment, so stated: Secondary | ICD-10-CM | POA: Diagnosis not present

## 2018-08-04 NOTE — ED Notes (Signed)
Pt refusing to take a shower.  Pt's bed soiled with urine.  Bed changed.  Pt refusing to take a shower.  Stating, "I'm not taking a shower, you black bitch!  You're not my Mama, I don't have to do what you say."  Pt cursing nurse and Engineer, materials.

## 2018-08-04 NOTE — ED Notes (Signed)
Woke up patient to give evening medication.  Pt. Encouraged to use bathroom before going to bed.  Pt. Used bathroom before going to bed.  Pt. Also given evening snack with drink.  Pt. Calm and cooperative.

## 2018-08-04 NOTE — ED Notes (Signed)
Breakfast tray given.  Hand hygiene encouraged.   

## 2018-08-04 NOTE — ED Notes (Addendum)
Pt refused to come to nurses station to get medication.  Pt stated, "I don't feel like it."  Pt encouraged to get up and walk to get medication and to walk to the bathroom.  Pt refused.  Medication taken to patient.  Pt encouraged to take a shower, but patient refused.  Stated, I don't feel like it."  Pt stated, "You're being mean and hateful."

## 2018-08-04 NOTE — ED Notes (Signed)
vol

## 2018-08-04 NOTE — ED Notes (Signed)
Hourly rounding reveals patient sleeping in room. No complaints, stable, in no acute distress. Q15 minute rounds and monitoring via Security Cameras to continue. 

## 2018-08-04 NOTE — ED Provider Notes (Signed)
-----------------------------------------   6:21 AM on 08/04/2018 -----------------------------------------   Blood pressure 103/64, pulse 69, temperature 98.3 F (36.8 C), temperature source Oral, resp. rate 16, height 5\' 3"  (1.6 m), weight 95 kg, last menstrual period 05/28/2018, SpO2 97 %.  The patient is sleeping at this time.  There have been no acute events since the last update.  Awaiting disposition plan from Behavioral Medicine team.   Irean Hong, MD 08/04/18 [phone]

## 2018-08-04 NOTE — ED Notes (Signed)
Report to include Situation, Background, Assessment, and Recommendations received from [facility]. Patient alert and oriented, warm and dry, in no acute distress. Patient denies VH and pain. Patient states she still has SI without plan and HI towards group home staff. Patient made aware of Q15 minute rounds and security cameras for their safety. Patient instructed to come to me with needs or concerns.

## 2018-08-04 NOTE — ED Provider Notes (Signed)
The patient's IVC has been rescinded by Dr. Katrinka Blazing of psychiatry.  Personally discussed the case and care with him and he is recommending; that the patient needs to find a care home setting.  Evidently she has been evicted from her current group home.  Calvin from TTS service working to find the patient appropriate care and placement   Sharyn Creamer, MD 08/04/18 1435

## 2018-08-04 NOTE — ED Notes (Signed)
Pt up at nurses station demanding to use the phone.  Stating that she's going to call 911.  Told patient that she's not allowed to call 911.  Pt stating that she will continue to stand at the nurses' station then.

## 2018-08-04 NOTE — ED Notes (Signed)
Hourly rounding reveals patient in room. No complaints, stable, in no acute distress. Q15 minute rounds and monitoring via Security Cameras to continue.Snack and beverage given. 

## 2018-08-04 NOTE — ED Notes (Addendum)
Pt in the shower at this time.  Pt went into shower and came out in less than 5 mins stating that she was done.

## 2018-08-04 NOTE — ED Notes (Signed)
Hourly rounding reveals patient in rest room. No complaints, stable, in no acute distress. Q15 minute rounds and monitoring via Security Cameras to continue. 

## 2018-08-05 DIAGNOSIS — F4325 Adjustment disorder with mixed disturbance of emotions and conduct: Secondary | ICD-10-CM | POA: Diagnosis not present

## 2018-08-05 DIAGNOSIS — G3184 Mild cognitive impairment, so stated: Secondary | ICD-10-CM | POA: Diagnosis not present

## 2018-08-05 DIAGNOSIS — F250 Schizoaffective disorder, bipolar type: Secondary | ICD-10-CM | POA: Diagnosis not present

## 2018-08-05 NOTE — ED Notes (Signed)
Hourly rounding reveals patient sleeping in room. No complaints, stable, in no acute distress. Q15 minute rounds and monitoring via Rover and Officer to continue.  

## 2018-08-05 NOTE — ED Notes (Signed)
Hourly rounding reveals patient sleeping in room. No complaints, stable, in no acute distress. Q15 minute rounds and monitoring via Security Cameras to continue. 

## 2018-08-05 NOTE — ED Notes (Signed)
Staff was at patients door with lunch trays and asked patient to come and grab hers, as staff members hands were full. Patient said "bring it to me", staff explained to her that she needed her to walk and come and get it, patient said no and called staff a "stupid whore" Staff informed patient, that being mean to staff is not appropriate.

## 2018-08-05 NOTE — ED Notes (Signed)
Hourly rounding reveals patient in room. No complaints, stable, in no acute distress. Q15 minute rounds and monitoring via Rover and Officer to continue.   

## 2018-08-05 NOTE — ED Notes (Signed)
BEHAVIORAL HEALTH ROUNDING Patient sleeping: No. Patient alert and oriented: yes Behavior appropriate: Yes.  ; If no, describe:  Nutrition and fluids offered: yes Toileting and hygiene offered: Yes  Sitter present: q15 minute observations and security monitoring Law enforcement present: Yes    

## 2018-08-05 NOTE — ED Provider Notes (Signed)
-----------------------------------------   5:55 AM on 08/05/2018 -----------------------------------------   Blood pressure 108/72, pulse 70, temperature 98.1 F (36.7 C), temperature source Oral, resp. rate 16, height 5\' 3"  (1.6 m), weight 95 kg, last menstrual period 05/28/2018, SpO2 99 %.  The patient is calm and cooperative at this time.  There have been no acute events since the last update.  Awaiting disposition plan from TTS and social work team.   Irean Hong, MD 08/05/18 [phone]

## 2018-08-05 NOTE — ED Notes (Signed)
Report to include Situation, Background, Assessment, and Recommendations received from [facility]. Patient alert, warm and dry, in no acute distress. Patient denies HI, and pain. Patient states she has SI without plan and HI towards Group Home staff. Patient made aware of Q15 minute rounds and Psychologist, counselling presence for their safety. Patient instructed to come to me with needs or concerns.

## 2018-08-05 NOTE — ED Notes (Signed)
Pt brought to RM 23 from BHU 5. Pt oriented to the area and is calm and cooperative at this time.

## 2018-08-05 NOTE — Consult Note (Signed)
[facility]  08/05/2018 12:32 PM Sherri DonningKimberly Green  MRN:  [id number] Principal Problem: Adjustment disorder with mixed disturbance of emotions and conduct Discharge Diagnoses: Principal Problem:   Adjustment disorder with mixed disturbance of emotions and conduct  From initial intake: Plan:  Continuing to contact her care coordinator [name](Ashley) regarding placement with her family or a group home, discharged from her group home today as her 30 day notices was complete. She does not meet criteria for inpatient psychiatric hospitalization at this time.  Assessment: Reviewed the patient's previous notes, noted that she presented to the hospital after becoming frustrated that she no longer has housing as her group home has evicted her after a 30 day notice. According to her chart the patient has multiple similar presentations, which appear to thematically revolve around reported suicidality in the setting of poor frustration tolerance, intellectual disability and psychosocial stressors. She is currently denying suicidal or homicidal ideations and is fixated on finding somewhere to live. She requested to speak to a Child psychotherapistsocial worker regarding assistance in placement. She denies AVH at this time. Reportedly this is her baseline, she does not appear to warrant inpatient psychiatric hospitalization at this time and I will rescind her IVC so that SW can work with her and her ACT team for community placement.  Patient is seen, chart is reviewed, collateral from patient's CST team. Total Time spent with patient: 35 minutes  Past Psychiatric History: schizoaffective disorder, intellectual disability  On evaluation today, patient is calm and cooperative.  [place] housing to live closer to a family member, specifically her aunt.  Patient states that she would be able to stay in a hotel.  She denies suicidal or homicidal ideation.  She denies auditory  visual hallucinations.  Patient is agreeable to staying in hospital for housing assistance as she realized she does not have the finances in order [place].  Collateral [facility] from [facility] [phone]) who expresses concern that patient is unable to adequately care for herself and unreliable and managing her own medications and finances.  He states that there has been a petition for patient to be placed under guardianship of the state, however this has not been completed.  There is a group home that is interested in interviewing SpencerKimberly, and arrangements are being made if patient is willing to stay in hospital to allow assistance for housing options.   Past Medical History:  Past Medical History:  Diagnosis Date  . Anxiety   . Asthma   . Borderline personality disorder (HCC)   . Cerebral palsy (HCC)   . COPD (chronic obstructive pulmonary disease) (HCC)   . Depression   . GERD (gastroesophageal reflux disease)   . Mild cognitive impairment   . Schizoaffective disorder (HCC)   . Wound abscess     Past Surgical History:  Procedure Laterality Date  . CHOLECYSTECTOMY    . INCISION AND DRAINAGE ABSCESS Right 01/02/2015   Procedure: INCISION AND DRAINAGE ABSCESS;  Surgeon: Tiney Rougealph Ely III, MD;  Location: [facility] ORS;  Service: General;  Laterality: Right;  . TONSILLECTOMY     Family History:  Family History  Problem Relation Age of Onset  . Heart attack Father   . Diabetes Mother    Family Psychiatric  History: none  Social History:  Patient is essentially homeless at this time, as she previously was residing in a group home which  had provided her 30-day notice.  Her 30-day notice is completed. Social History   Substance and Sexual Activity  Alcohol Use No     Social History   Substance and Sexual Activity  Drug Use No    Social History   Socioeconomic History  . Marital status: Single     Spouse name: Not on file  . Number of children: Not on file  . Years of education: Not on file  . Highest education level: Not on file  Occupational History  . Occupation: Disabled  Social Needs  . Financial resource strain: Not on file  . Food insecurity:    Worry: Not on file    Inability: Not on file  . Transportation needs:    Medical: Not on file    Non-medical: Not on file  Tobacco Use  . Smoking status: Former Games developer  . Smokeless tobacco: Never Used  Substance and Sexual Activity  . Alcohol use: No  . Drug use: No  . Sexual activity: Not Currently  Lifestyle  . Physical activity:    Days per week: Not on file    Minutes per session: Not on file  . Stress: Not on file  Relationships  . Social connections:    Talks on phone: Not on file    Gets together: Not on file    Attends religious service: Not on file    Active member of club or organization: Not on file    Attends meetings of clubs or organizations: Not on file    Relationship status: Not on file  Other Topics Concern  . Not on file  Social History Narrative   Pt lives with mother, nephew, sister in law    Has this patient used any form of tobacco in the last 30 days? (Cigarettes, Smokeless Tobacco, Cigars, and/or Pipes) NA  Current Medications: Current Facility-Administered Medications  Medication Dose Route Frequency Provider Last Rate Last Dose  . clonazePAM (KLONOPIN) tablet 0.5 mg  0.5 mg Oral BID Charm Rings, NP   0.5 mg at 08/05/18 1022  . divalproex (DEPAKOTE) DR tablet 500 mg  500 mg Oral BID Charm Rings, NP   500 mg at 08/05/18 1024  . escitalopram (LEXAPRO) tablet 20 mg  20 mg Oral Daily Charm Rings, NP   20 mg at 08/05/18 1022  . famotidine (PEPCID) tablet 20 mg  20 mg Oral BID Charm Rings, NP   20 mg at 08/05/18 1024  . fenofibrate tablet 160 mg  160 mg Oral Daily Charm Rings, NP   160 mg at 08/05/18 1025  . hydrOXYzine (ATARAX/VISTARIL) tablet 25 mg  25 mg Oral TID Charm Rings, NP   25 mg at 08/05/18 1023  . levothyroxine (SYNTHROID) tablet 50 mcg  50 mcg Oral QAC breakfast Charm Rings, NP   50 mcg at 08/05/18 1610  . OLANZapine (ZYPREXA) tablet 5 mg  5 mg Oral Q8H PRN Charm Rings, NP   5 mg at 08/05/18 1023  . oxybutynin (DITROPAN-XL) 24 hr tablet 5 mg  5 mg Oral Daily Charm Rings, NP   5 mg at 08/05/18 1023  . pantoprazole (PROTONIX) EC tablet 40 mg  40 mg Oral Daily Charm Rings, NP   40 mg at 08/05/18 1024  . risperiDONE (RISPERDAL) tablet 3 mg  3 mg Oral BID Charm Rings, NP   3 mg at 08/05/18 1025  . sucralfate (CARAFATE) tablet 1 g  1  g Oral TID WC & HS Charm Rings, NP   1 g at 08/05/18 7209   Current Outpatient Medications  Medication Sig Dispense Refill  . clonazePAM (KLONOPIN) 0.5 MG tablet Take 1 tablet (0.5 mg total) by mouth 2 (two) times daily. 60 tablet 1  . divalproex (DEPAKOTE) 500 MG DR tablet Take 1 tablet (500 mg total) by mouth 2 (two) times daily. 60 tablet 1  . escitalopram (LEXAPRO) 20 MG tablet Take 1 tablet (20 mg total) by mouth daily. 30 tablet 1  . famotidine (PEPCID) 20 MG tablet Take 1 tablet (20 mg total) by mouth 2 (two) times daily. 30 tablet 0  . fenofibrate 160 MG tablet Take 1 tablet (160 mg total) by mouth daily. 30 tablet 1  . hydrOXYzine (ATARAX/VISTARIL) 25 MG tablet Take 1 tablet (25 mg total) by mouth every 6 (six) hours as needed for anxiety. (Patient taking differently: Take 25 mg by mouth 3 (three) times daily. ) 12 tablet 0  . levothyroxine (SYNTHROID, LEVOTHROID) 50 MCG tablet Take 1 tablet (50 mcg total) by mouth daily before breakfast. 30 tablet 1  . oxybutynin (DITROPAN-XL) 5 MG 24 hr tablet Take 1 tablet (5 mg total) by mouth daily. 30 tablet 1  . pantoprazole (PROTONIX) 40 MG tablet Take 1 tablet (40 mg total) by mouth daily. 30 tablet 1  . risperiDONE (RISPERDAL) 3 MG tablet Take 1 tablet (3 mg total) by mouth 2 (two) times daily. 60 tablet 1  . sucralfate (CARAFATE) 1 g tablet Take 1  tablet (1 g total) by mouth 4 (four) times daily -  with meals and at bedtime. (Patient taking differently: Take 1 g by mouth 3 (three) times daily. ) 120 tablet 1   PTA Medications: (Not in a hospital admission)   Musculoskeletal: Strength & Muscle Tone: within normal limits Gait & Station: normal Patient leans: N/A  Psychiatric Specialty Exam: Physical Exam  Nursing note and vitals reviewed. Constitutional: She is oriented to person, place, and time. She appears well-developed and well-nourished. No distress.  HENT:  Head: Normocephalic.  Eyes: EOM are normal.  Neck: Normal range of motion.  Cardiovascular: Normal rate.  Respiratory: Effort normal. No respiratory distress.  Musculoskeletal: Normal range of motion.  Neurological: She is alert and oriented to person, place, and time.  Psychiatric: Her speech is normal and behavior is normal. Judgment and thought content normal. Her mood appears anxious. Her affect is blunt. Cognition and memory are impaired.    Review of Systems  Psychiatric/Behavioral: The patient is nervous/anxious.   All other systems reviewed and are negative.   Blood pressure 108/72, pulse 70, temperature 98.1 F (36.7 C), temperature source Oral, resp. rate 16, height 5\' 3"  (1.6 m), weight 95 kg, last menstrual period 05/28/2018, SpO2 99 %.Body mass index is 37.1 kg/m.  General Appearance: Casual  Eye Contact:  Good  Speech:  Slow  Volume:  Normal  Mood:  Anxious, Dysphoric, Irritable and Worried  Affect:  Blunt  Thought Process:  Coherent and Descriptions of Associations: Intact  Orientation:  Full (Time, Place, and Person)  Thought Content:  Hallucinations: None and Rumination  Suicidal Thoughts:  No  Homicidal Thoughts:  No  Memory:  Immediate;   Good Recent;   Good Remote;   Good  Judgement:  Other:  Limited  Insight:  Shallow  Psychomotor Activity:  Normal  Concentration:  Concentration: Good and Attention Span: Good  Recall:  [employer]  of Knowledge:  Fair  Language:  Good  Akathisia:  No  Handed:  Right  AIMS (if indicated):   N/A  Assets:  Housing Leisure Time Physical Health Resilience Social Support  ADL's:  Intact  Cognition:  WNL  Sleep:   N/A     Demographic Factors:  Caucasian  Loss Factors: NA  Historical Factors: Impulsivity  Risk Reduction Factors:   Sense of responsibility to family, Living with another person, especially a relative, Positive social support and Positive therapeutic relationship  Continued Clinical Symptoms:  Anxiety, mild  Cognitive Features That Contribute To Risk:  None    Suicide Risk:  Minimal: No identifiable suicidal ideation.  Patients presenting with no risk factors but with morbid ruminations; may be classified as minimal risk based on the severity of the depressive symptoms   Plan Of Care/Follow-up recommendations:   Adjustment disorder with mixed disturbance of emotions and conduct: -continue Klonopin 0.5 mg BID -continue Vistaril 25 mg every 8 hours PRN anxiety  Schizoaffective disorder, depressive disorder: -continue Lexapro 20 mg daily -continue Depakote 500 mg BID -continue Risperdal 3 mg BID  Asthma: -albuterol 2 puffs every hours Activity:  as tolerated Diet:  heart healthy diet  Disposition: Continue working with her ACT team/Social work for placement in another group home setting, as patient is unlikely to be successful living independently.  Patient is agreeable at this time to staying in emergency department to receive assistance from social work on housing.  Mariel Craft, MD 08/05/2018, 12:32 PM

## 2018-08-05 NOTE — ED Notes (Signed)
Pt given meal tray and juice. 

## 2018-08-05 NOTE — ED Notes (Signed)
Pt VOL/ Pending Placement

## 2018-08-05 NOTE — Progress Notes (Signed)
10:51am -  CSW received a return phone call from pt's Care Coordinator from [facility], Larena Sox. [name] stated that she is working on finding a group home for pt, and has been contacting facilities in the [place] area.  [name] also stated that CST from [facility] [phone]) is also trying to find group home placement for pt.     [name]  [facility] ED  [phone]

## 2018-08-05 NOTE — Progress Notes (Signed)
9:27am - CSW contacted pt's Care Coordinater, Larena Sox [phone]) to assist with group home placement. Voicemail message was left.     [name]  [facility] ED  [phone]

## 2018-08-06 DIAGNOSIS — G3184 Mild cognitive impairment, so stated: Secondary | ICD-10-CM | POA: Diagnosis not present

## 2018-08-06 DIAGNOSIS — F250 Schizoaffective disorder, bipolar type: Secondary | ICD-10-CM | POA: Diagnosis not present

## 2018-08-06 DIAGNOSIS — F4325 Adjustment disorder with mixed disturbance of emotions and conduct: Secondary | ICD-10-CM | POA: Diagnosis not present

## 2018-08-06 MED ORDER — CLONAZEPAM 1 MG PO TABS
1.0000 mg | ORAL_TABLET | Freq: Two times a day (BID) | ORAL | Status: DC
  Administered 2018-08-07 – 2018-08-09 (×5): 1 mg via ORAL
  Filled 2018-08-06 (×5): qty 1

## 2018-08-06 MED ORDER — DIVALPROEX SODIUM ER 500 MG PO TB24
1000.0000 mg | ORAL_TABLET | Freq: Every day | ORAL | Status: DC
  Administered 2018-08-06 – 2018-08-08 (×3): 1000 mg via ORAL
  Filled 2018-08-06 (×3): qty 2

## 2018-08-06 MED ORDER — ESCITALOPRAM OXALATE 10 MG PO TABS
10.0000 mg | ORAL_TABLET | Freq: Every day | ORAL | Status: DC
  Administered 2018-08-07: 10 mg via ORAL
  Filled 2018-08-06: qty 1

## 2018-08-06 MED ORDER — FLUVOXAMINE MALEATE 50 MG PO TABS
50.0000 mg | ORAL_TABLET | Freq: Every day | ORAL | Status: DC
  Administered 2018-08-06 – 2018-08-07 (×2): 50 mg via ORAL
  Filled 2018-08-06 (×3): qty 1

## 2018-08-06 MED ORDER — LORAZEPAM 1 MG PO TABS: 1.0000 mg | ORAL_TABLET | Freq: Four times a day (QID) | ORAL | Status: DC | PRN

## 2018-08-06 MED ORDER — ARIPIPRAZOLE 10 MG PO TABS
20.0000 mg | ORAL_TABLET | Freq: Every day | ORAL | Status: DC
  Administered 2018-08-07 – 2018-08-09 (×3): 20 mg via ORAL
  Filled 2018-08-06 (×3): qty 2

## 2018-08-06 MED ORDER — LORAZEPAM 2 MG/ML IJ SOLN: 1.0000 mg | Freq: Four times a day (QID) | INTRAMUSCULAR | Status: DC | PRN

## 2018-08-06 NOTE — ED Notes (Signed)
Hourly rounding reveals patient in room. No complaints, stable, in no acute distress. Q15 minute rounds and monitoring via Security Cameras to continue. 

## 2018-08-06 NOTE — ED Notes (Signed)
Patient continues to refuse medications because she cant be transferred to another facility "I don't like it here"

## 2018-08-06 NOTE — ED Notes (Signed)
Patient called her care coordinator [name] with staff watching, as soon as Clinical research associate turned her head to talk with another patient, patient called 911 to come and get her Staff spoke with patient and informed patient that she is not allowed to call 911, especially if its not an emergency.

## 2018-08-06 NOTE — Progress Notes (Signed)
4:38pm - CSW contacted pt's Care Coordinator, Larena Sox to see if there were any updates regarding group home placement. Voicemail left.     [name]  [facility] ED  [phone]

## 2018-08-06 NOTE — ED Notes (Signed)
Patient has been throwing tantrums today when she cant get her way or talk staff into doing what she wants, when staff had patient make up her own bed, patient called staff disrespectful names and patient said I am refusing my medication.

## 2018-08-06 NOTE — ED Notes (Signed)
Pt. Transferred to BHU from ED to room after screening for contraband. Report to include Situation, Background, Assessment and Recommendations from [facility]. Pt. Oriented to unit including Q15 minute rounds as well as the security cameras for their protection. Patient is alert and oriented, warm and dry in no acute distress. Patient reported SI and  HI. Denied AVH. Pt. Encouraged to let me know if needs arise.

## 2018-08-06 NOTE — Consult Note (Signed)
[facility] Psych ED Discharge  08/06/2018 4:25 PM [name]  MRN:  [id number] Principal Problem: Adjustment disorder with mixed disturbance of emotions and conduct Discharge Diagnoses: Principal Problem:   Adjustment disorder with mixed disturbance of emotions and conduct  From initial intake: Plan:  Continuing to contact her care coordinator [name]) regarding placement with her family or a group home, discharged from her group home today as her 30 day notices was complete. She does not meet criteria for inpatient psychiatric hospitalization at this time.  Assessment: Reviewed the patient's previous notes, noted that she presented to the hospital after becoming frustrated that she no longer has housing as her group home has evicted her after a 30 day notice. According to her chart the patient has multiple similar presentations, which appear to thematically revolve around reported suicidality in the setting of poor frustration tolerance, intellectual disability and psychosocial stressors. She is currently denying suicidal or homicidal ideations and is fixated on finding somewhere to live. She requested to speak to a Child psychotherapist regarding assistance in placement. She denies AVH at this time. Reportedly this is her baseline, she does not appear to warrant inpatient psychiatric hospitalization at this time and I will rescind her IVC so that SW can work with her and her ACT team for community placement.  Patient is seen, chart is reviewed, collateral from patient's CST team and care coordinator. Total Time spent with patient: 35 minutes  Past Psychiatric History: schizoaffective disorder, intellectual disability  On evaluation today, patient is calm and cooperative.  She continues to request to go to [facility] in order to look for housing to live closer to a family member, specifically her aunt.  Patient states that if she cannot go to [facility] she is willing to stay in the hospital while  her care coordinator helps her find new housing at this time, but is expressing she would ideally like to discharge soon.  She denies suicidal or homicidal ideation.  She denies auditory visual hallucinations.  Collateral obtained from patient's care coordinator, [name] 08/06/2018: Care coordinator is working on arranging interviews with group homes for [place] in order to discharge directly to a new living environment.  Collateral obtained from [facility] 08/05/2018 CST from Psychotherapeutic Services [phone]) who expresses concern that patient is unable to adequately care for herself and unreliable and managing her own medications and finances.  He states that there has been a petition for patient to be placed under guardianship of the state, however this has not been completed.  There is a group home that is interested in interviewing [place], and arrangements are being made if patient is willing to stay in hospital to allow assistance for housing options.   Past Medical History:  Past Medical History:  Diagnosis Date  . Anxiety   . Asthma   . Borderline personality disorder (HCC)   . Cerebral palsy (HCC)   . COPD (chronic obstructive pulmonary disease) (HCC)   . Depression   . GERD (gastroesophageal reflux disease)   . Mild cognitive impairment   . Schizoaffective disorder (HCC)   . Wound abscess     Past Surgical History:  Procedure Laterality Date  . CHOLECYSTECTOMY    . INCISION AND DRAINAGE ABSCESS Right 01/02/2015   Procedure: INCISION AND DRAINAGE ABSCESS;  Surgeon: Tiney Rouge III, MD;  Location: [facility] ORS;  Service: General;  Laterality: Right;  . TONSILLECTOMY     Family History:  Family History  Problem Relation Age of Onset  . Heart  attack Father   . Diabetes Mother    Family Psychiatric  History: none  Social History:  Patient is essentially homeless at this time, as she previously was residing in a group home which had provided her 30-day notice.  Her  30-day notice is completed.  Social History   Substance and Sexual Activity  Alcohol Use No     Social History   Substance and Sexual Activity  Drug Use No    Social History   Socioeconomic History  . Marital status: Single    Spouse name: Not on file  . Number of children: Not on file  . Years of education: Not on file  . Highest education level: Not on file  Occupational History  . Occupation: Disabled  Social Needs  . Financial resource strain: Not on file  . Food insecurity:    Worry: Not on file    Inability: Not on file  . Transportation needs:    Medical: Not on file    Non-medical: Not on file  Tobacco Use  . Smoking status: Former Games developer  . Smokeless tobacco: Never Used  Substance and Sexual Activity  . Alcohol use: No  . Drug use: No  . Sexual activity: Not Currently  Lifestyle  . Physical activity:    Days per week: Not on file    Minutes per session: Not on file  . Stress: Not on file  Relationships  . Social connections:    Talks on phone: Not on file    Gets together: Not on file    Attends religious service: Not on file    Active member of club or organization: Not on file    Attends meetings of clubs or organizations: Not on file    Relationship status: Not on file  Other Topics Concern  . Not on file  Social History Narrative   Pt lives with mother, nephew, sister in law    Has this patient used any form of tobacco in the last 30 days? (Cigarettes, Smokeless Tobacco, Cigars, and/or Pipes) NA  Current Medications: Current Facility-Administered Medications  Medication Dose Route Frequency Provider Last Rate Last Dose  . clonazePAM (KLONOPIN) tablet 0.5 mg  0.5 mg Oral BID Charm Rings, NP   0.5 mg at 08/06/18 1051  . divalproex (DEPAKOTE) DR tablet 500 mg  500 mg Oral BID Charm Rings, NP   500 mg at 08/06/18 1050  . escitalopram (LEXAPRO) tablet 20 mg  20 mg Oral Daily Charm Rings, NP   20 mg at 08/06/18 1051  . famotidine  (PEPCID) tablet 20 mg  20 mg Oral BID Charm Rings, NP   20 mg at 08/06/18 1051  . fenofibrate tablet 160 mg  160 mg Oral Daily Charm Rings, NP   160 mg at 08/06/18 1000  . hydrOXYzine (ATARAX/VISTARIL) tablet 25 mg  25 mg Oral TID Charm Rings, NP   25 mg at 08/06/18 1052  . levothyroxine (SYNTHROID) tablet 50 mcg  50 mcg Oral QAC breakfast Charm Rings, NP   50 mcg at 08/05/18 1610  . OLANZapine (ZYPREXA) tablet 5 mg  5 mg Oral Q8H PRN Charm Rings, NP   5 mg at 08/05/18 1023  . oxybutynin (DITROPAN-XL) 24 hr tablet 5 mg  5 mg Oral Daily Charm Rings, NP   5 mg at 08/06/18 1100  . pantoprazole (PROTONIX) EC tablet 40 mg  40 mg Oral Daily Charm Rings, NP  40 mg at 08/06/18 1051  . risperiDONE (RISPERDAL) tablet 3 mg  3 mg Oral BID Charm RingsLord, Jamison Y, NP   3 mg at 08/06/18 1051  . sucralfate (CARAFATE) tablet 1 g  1 g Oral TID WC & HS Charm RingsLord, Jamison Y, NP   1 g at 08/06/18 0830   Current Outpatient Medications  Medication Sig Dispense Refill  . clonazePAM (KLONOPIN) 0.5 MG tablet Take 1 tablet (0.5 mg total) by mouth 2 (two) times daily. 60 tablet 1  . divalproex (DEPAKOTE) 500 MG DR tablet Take 1 tablet (500 mg total) by mouth 2 (two) times daily. 60 tablet 1  . escitalopram (LEXAPRO) 20 MG tablet Take 1 tablet (20 mg total) by mouth daily. 30 tablet 1  . famotidine (PEPCID) 20 MG tablet Take 1 tablet (20 mg total) by mouth 2 (two) times daily. 30 tablet 0  . fenofibrate 160 MG tablet Take 1 tablet (160 mg total) by mouth daily. 30 tablet 1  . hydrOXYzine (ATARAX/VISTARIL) 25 MG tablet Take 1 tablet (25 mg total) by mouth every 6 (six) hours as needed for anxiety. (Patient taking differently: Take 25 mg by mouth 3 (three) times daily. ) 12 tablet 0  . levothyroxine (SYNTHROID, LEVOTHROID) 50 MCG tablet Take 1 tablet (50 mcg total) by mouth daily before breakfast. 30 tablet 1  . oxybutynin (DITROPAN-XL) 5 MG 24 hr tablet Take 1 tablet (5 mg total) by mouth daily. 30 tablet 1   . pantoprazole (PROTONIX) 40 MG tablet Take 1 tablet (40 mg total) by mouth daily. 30 tablet 1  . risperiDONE (RISPERDAL) 3 MG tablet Take 1 tablet (3 mg total) by mouth 2 (two) times daily. 60 tablet 1  . sucralfate (CARAFATE) 1 g tablet Take 1 tablet (1 g total) by mouth 4 (four) times daily -  with meals and at bedtime. (Patient taking differently: Take 1 g by mouth 3 (three) times daily. ) 120 tablet 1   PTA Medications: (Not in a hospital admission)   Musculoskeletal: Strength & Muscle Tone: within normal limits Gait & Station: normal Patient leans: N/A  Psychiatric Specialty Exam: Physical Exam  Nursing note and vitals reviewed. Constitutional: She is oriented to person, place, and time. She appears well-developed and well-nourished. No distress.  HENT:  Head: Normocephalic.  Eyes: EOM are normal.  Neck: Normal range of motion.  Cardiovascular: Normal rate.  Respiratory: Effort normal. No respiratory distress.  Musculoskeletal: Normal range of motion.  Neurological: She is alert and oriented to person, place, and time.  Psychiatric: Her speech is normal. Her affect is blunt. She is slowed.    Review of Systems  Psychiatric/Behavioral: Negative for depression, hallucinations, substance abuse and suicidal ideas. The patient is not nervous/anxious.   All other systems reviewed and are negative.   Blood pressure 103/74, pulse 64, temperature 97.7 F (36.5 C), temperature source Oral, resp. rate 18, height 5\' 3"  (1.6 m), weight 95 kg, last menstrual period 05/28/2018, SpO2 98 %.Body mass index is 37.1 kg/m.  General Appearance: Casual  Eye Contact:  Good  Speech:  Slow  Volume:  Normal  Mood:  Euthymic and Worried  Affect:  Blunt  Thought Process:  Coherent and Descriptions of Associations: Intact  Orientation:  Full (Time, Place, and Person)  Thought Content:  Hallucinations: None and Rumination perseverative [place] near to family   Suicidal Thoughts:  No  Homicidal Thoughts:  No  Memory:  Immediate;   Good Recent;  Good Remote;   Good  Judgement:  Other:  Limited  Insight:  Shallow  Psychomotor Activity:  Normal  Concentration:  Concentration: Good and Attention Span: Good  Recall:  [employer] of Knowledge:  Fair  Language:  Good  Akathisia:  No  Handed:  Right  AIMS (if indicated):   N/A  Assets:  Resilience Social Support  ADL's:  Intact  Cognition:  WNL  Sleep:   Adequate     Demographic Factors:  Caucasian  Loss Factors: NA  Historical Factors: Impulsivity  Risk Reduction Factors:   Sense of responsibility to family, Living with another person, especially a relative, Positive social support and Positive therapeutic relationship  Continued Clinical Symptoms:  Anxiety, mild  Cognitive Features That Contribute To Risk:  None    Suicide Risk:  Minimal: No identifiable suicidal ideation.  Patients presenting with no risk factors but with morbid ruminations; may be classified as minimal risk based on the severity of the depressive symptoms   Plan Of Care/Follow-up recommendations:   Adjustment disorder with mixed disturbance of emotions and conduct: -continue Klonopin 0.5 mg BID -continue Vistaril 25 mg every 8 hours PRN anxiety  Schizoaffective disorder, depressive disorder: -continue Lexapro 20 mg daily -continue Depakote 500 mg BID -continue Risperdal 3 mg BID  Asthma: -albuterol 2 puffs every hours Activity:  as tolerated Diet:  heart healthy diet  Disposition: Continue working with her ACT team/Social work for placement in another group home setting, as patient is unlikely to be successful living independently.  Patient is agreeable at this time to staying in emergency department to receive assistance from social work on housing.  Mariel Craft, MD 08/06/2018, 4:25 PM

## 2018-08-06 NOTE — ED Notes (Signed)
VOL  PENDING  PLACEMENT 

## 2018-08-06 NOTE — ED Notes (Signed)
Change Klonopin 1 mg twice a day breakfast and dinner Discontinue Depakote DR  Start Depakote ER 1000 mg at bedtime Decrease Lexapro to 10 mg  Start Luvox 50 mg at bedtime Discontinue Vistaril 50 mg  Discontinue Risperidal  Start Abilify 20 mg daily   Dr. Viviano Simas

## 2018-08-06 NOTE — ED Notes (Signed)
Report to include Situation, Background, Assessment, and Recommendations received from [facility]. Patient alert and oriented, warm and dry, in no acute distress. Patient denies SI and pain. Patient states she still has SI without a plan and HI towards [facility] staff as well as AH without command and VH of people in general. Patient made aware of Q15 minute rounds and security cameras for their safety. Patient instructed to come to me with needs or concerns.

## 2018-08-06 NOTE — ED Notes (Signed)
With staff supervision, patient attempted to call her CST worker Romilda Joy) [phone] and her care coordinator [name], no answer  Patient is trying to be transferred to another facility, patient says she is ready to go

## 2018-08-06 NOTE — ED Notes (Addendum)
Patient was assisted with putting her underwear on, patient gets frustrated very easily when staff encourages her to try for herself, when staff attempted to allow patient to do it herself, patient became upset and called staff a "bitch"

## 2018-08-06 NOTE — BH Assessment (Signed)
Late Entry: Provided her with Group Home in [place], [facility] [phone]). Writer updated Child psychotherapist  (Tania P.) and she will follow-up with them.

## 2018-08-06 NOTE — ED Notes (Signed)
Hourly rounding reveals patient in room. No complaints, stable, in no acute distress. Q15 minute rounds and monitoring via Security Cameras to continue.Snack and beverage given. 

## 2018-08-06 NOTE — ED Notes (Signed)
Hourly rounding reveals patient sleeping in room. No complaints, stable, in no acute distress. Q15 minute rounds and monitoring via Security Cameras to continue. 

## 2018-08-07 DIAGNOSIS — F4325 Adjustment disorder with mixed disturbance of emotions and conduct: Secondary | ICD-10-CM | POA: Diagnosis not present

## 2018-08-07 DIAGNOSIS — F250 Schizoaffective disorder, bipolar type: Secondary | ICD-10-CM | POA: Diagnosis not present

## 2018-08-07 DIAGNOSIS — G3184 Mild cognitive impairment, so stated: Secondary | ICD-10-CM | POA: Diagnosis not present

## 2018-08-07 MED ORDER — NITROFURANTOIN MACROCRYSTAL 50 MG PO CAPS
100.0000 mg | ORAL_CAPSULE | Freq: Every day | ORAL | Status: DC
  Administered 2018-08-07: 100 mg via ORAL
  Filled 2018-08-07 (×2): qty 2

## 2018-08-07 NOTE — ED Notes (Signed)
Hourly rounding reveals patient sleeping in room. No complaints, stable, in no acute distress. Q15 minute rounds and monitoring via Security Cameras to continue. 

## 2018-08-07 NOTE — ED Notes (Signed)
Pt. Laying in bed.  Pt. Woke by this nurse.  Pt. Calm and cooperative.  Pt. Requesting snack tray and drink.  Pt. Told we need urine specimen.  Pt. Stated she would contact this nurse before going to bathroom.

## 2018-08-07 NOTE — ED Provider Notes (Signed)
-----------------------------------------   3:44 AM on 08/07/2018 -----------------------------------------   Blood pressure 103/74, pulse 64, temperature 97.7 F (36.5 C), temperature source Oral, resp. rate 18, height 1.6 m (5\' 3" ), weight 95 kg, last menstrual period 05/28/2018, SpO2 98 %.  The patient is calm and cooperative at this time.  There have been no acute events since the last update.  Awaiting disposition plan from Behavioral Medicine team and/or social work.   Loleta Rose, MD 08/07/18 [phone]

## 2018-08-07 NOTE — ED Notes (Signed)
Pt. Given meal tray and drink upon request. 

## 2018-08-07 NOTE — BH Assessment (Signed)
This Clinical research associate spoke directly with [name]: [phone] (CST Psychotherapeutic Services) and informed him that pt is requesting to be discharged and pt is currently voluntary. He asked if there was a placement arranged for the pt at the time of discharge. It was explained to him that pt reports she has money available to be able to afford a stay in a motel. This Clinical research associate asked [name] if someone from his CST team would be able to p/u pt once she is discharged from the ED. He stated he would call me back (call back number provided).

## 2018-08-07 NOTE — ED Notes (Signed)
Pt given clean scrubs and supplies to wash up with in bathroom. Bed wiped down / bed linen changed. Maintained on 15 minute checks and observation by security camera for safety.

## 2018-08-07 NOTE — Consult Note (Signed)
[facility]  08/07/2018 4:55 PM Sherri DonningKimberly Green  MRN:  [id number] Principal Problem: Adjustment disorder with mixed disturbance of emotions and conduct Discharge Diagnoses: Principal Problem:   Adjustment disorder with mixed disturbance of emotions and conduct  From initial intake: Plan:  Continuing to contact her care coordinator [name]([name]) regarding placement with her family or a group home, discharged from her group home today as her 30 day notices was complete. She does not meet criteria for inpatient psychiatric hospitalization at this time.  Assessment: Reviewed the patient's previous notes, noted that she presented to the hospital after becoming frustrated that she no longer has housing as her group home has evicted her after a 30 day notice. According to her chart the patient has multiple similar presentations, which appear to thematically revolve around reported suicidality in the setting of poor frustration tolerance, intellectual disability and psychosocial stressors. She is currently denying suicidal or homicidal ideations and is fixated on finding somewhere to live. She requested to speak to a Child psychotherapistsocial worker regarding assistance in placement. She denies AVH at this time. Reportedly this is her baseline, she does not appear to warrant inpatient psychiatric hospitalization at this time and I will rescind her IVC so that SW can work with her and her ACT team for community placement.  Patient is seen, chart is reviewed, collateral from patient's CST team and care coordinator. Total Time spent with patient: 25 minutes  Past Psychiatric History: schizoaffective disorder, intellectual disability  Copy nursing staff on 08/06/2018 due to patient having labile behaviors.  Medication management changes below: Change Klonopin 1 mg twice a day breakfast and dinner Discontinue Depakote DR  Start Depakote ER 1000 mg at bedtime Decrease Lexapro to 10 mg  Start Luvox 50 mg at  bedtime Discontinue Vistaril 50 mg  Discontinue Risperdal  Start Abilify 20 mg daily   On evaluation today, patient is calm and cooperative at start of shift.  [facility].  With redirection she is able to endorse understanding that she cannot be transferred by ambulance.  Patient is agreeable to staying in hospital while living arrangements are made for group home.  Has baseline passive suicidal thoughts, with thoughts today of walking into traffic, as she does not want to stay in hospital.  She denies homicidal ideation.  She denies auditory visual hallucinations.  Collateral obtained from patient's care coordinator, Sherri Sheldonshley 08/06/2018: Care coordinator is working on arranging interviews with group homes for [name] in order to discharge directly to a new living environment.  [facility] 08/05/2018 CST from [facility] [phone]) who expresses concern that patient is unable to adequately care for herself and unreliable and managing her own medications and finances.  He states that there has been a petition for patient to be placed under guardianship of the state, however this has not been completed.  There is a group home that is interested in interviewing [name], and arrangements are being made if patient is willing to stay in hospital to allow assistance for housing options.   Past Medical History:  Past Medical History:  Diagnosis Date  . Anxiety   . Asthma   . Borderline personality disorder (HCC)   . Cerebral palsy (HCC)   . COPD (chronic obstructive pulmonary disease) (HCC)   . Depression   . GERD (gastroesophageal reflux disease)   . Mild cognitive impairment   . Schizoaffective disorder (HCC)   . Wound abscess  Past Surgical History:  Procedure Laterality Date  . CHOLECYSTECTOMY    . INCISION AND DRAINAGE ABSCESS Right 01/02/2015   Procedure: INCISION AND DRAINAGE  ABSCESS;  Surgeon: Tiney Rouge III, MD;  Location: [facility] ORS;  Service: General;  Laterality: Right;  . TONSILLECTOMY     Family History:  Family History  Problem Relation Age of Onset  . Heart attack Father   . Diabetes Mother    Family Psychiatric  History: none  Social History:  Patient is essentially homeless at this time, as she previously was residing in a group home which had provided her 30-day notice.  Her 30-day notice is completed.  Social History   Substance and Sexual Activity  Alcohol Use No     Social History   Substance and Sexual Activity  Drug Use No    Social History   Socioeconomic History  . Marital status: Single    Spouse name: Not on file  . Number of children: Not on file  . Years of education: Not on file  . Highest education level: Not on file  Occupational History  . Occupation: Disabled  Social Needs  . Financial resource strain: Not on file  . Food insecurity:    Worry: Not on file    Inability: Not on file  . Transportation needs:    Medical: Not on file    Non-medical: Not on file  Tobacco Use  . Smoking status: Former Games developer  . Smokeless tobacco: Never Used  Substance and Sexual Activity  . Alcohol use: No  . Drug use: No  . Sexual activity: Not Currently  Lifestyle  . Physical activity:    Days per week: Not on file    Minutes per session: Not on file  . Stress: Not on file  Relationships  . Social connections:    Talks on phone: Not on file    Gets together: Not on file    Attends religious service: Not on file    Active member of club or organization: Not on file    Attends meetings of clubs or organizations: Not on file    Relationship status: Not on file  Other Topics Concern  . Not on file  Social History Narrative   Pt lives with mother, nephew, sister in law    Has this patient used any form of tobacco in the last 30 days? (Cigarettes, Smokeless Tobacco, Cigars, and/or Pipes) NA  Current Medications: Current  Facility-Administered Medications  Medication Dose Route Frequency Provider Last Rate Last Dose  . ARIPiprazole (ABILIFY) tablet 20 mg  20 mg Oral Daily Mariel Craft, MD   20 mg at 08/07/18 1005  . clonazePAM (KLONOPIN) tablet 1 mg  1 mg Oral BID Mariel Craft, MD   1 mg at 08/07/18 1646  . divalproex (DEPAKOTE ER) 24 hr tablet 1,000 mg  1,000 mg Oral QHS Mariel Craft, MD   1,000 mg at 08/06/18 2106  . escitalopram (LEXAPRO) tablet 10 mg  10 mg Oral Daily Mariel Craft, MD   10 mg at 08/07/18 1006  . famotidine (PEPCID) tablet 20 mg  20 mg Oral BID Charm Rings, NP   20 mg at 08/07/18 1006  . fenofibrate tablet 160 mg  160 mg Oral Daily Charm Rings, NP   160 mg at 08/07/18 1006  . fluvoxaMINE (LUVOX) tablet 50 mg  50 mg Oral QHS Mariel Craft, MD   50 mg at 08/06/18 2106  . levothyroxine (  SYNTHROID) tablet 50 mcg  50 mcg Oral QAC breakfast Charm Rings, NP   50 mcg at 08/07/18 0841  . LORazepam (ATIVAN) tablet 1 mg  1 mg Oral Q6H PRN Mariel Craft, MD       Or  . LORazepam (ATIVAN) injection 1 mg  1 mg Intramuscular Q6H PRN Mariel Craft, MD      . nitrofurantoin (MACRODANTIN) capsule 100 mg  100 mg Oral Q1500 Mariel Craft, MD   100 mg at 08/07/18 1534  . OLANZapine (ZYPREXA) tablet 5 mg  5 mg Oral Q8H PRN Charm Rings, NP   5 mg at 08/05/18 1023  . oxybutynin (DITROPAN-XL) 24 hr tablet 5 mg  5 mg Oral Daily Charm Rings, NP   5 mg at 08/07/18 1006  . pantoprazole (PROTONIX) EC tablet 40 mg  40 mg Oral Daily Charm Rings, NP   40 mg at 08/07/18 1006  . sucralfate (CARAFATE) tablet 1 g  1 g Oral TID WC & HS Charm Rings, NP   1 g at 08/07/18 1646   Current Outpatient Medications  Medication Sig Dispense Refill  . clonazePAM (KLONOPIN) 0.5 MG tablet Take 1 tablet (0.5 mg total) by mouth 2 (two) times daily. 60 tablet 1  . divalproex (DEPAKOTE) 500 MG DR tablet Take 1 tablet (500 mg total) by mouth 2 (two) times daily. 60 tablet 1  . escitalopram  (LEXAPRO) 20 MG tablet Take 1 tablet (20 mg total) by mouth daily. 30 tablet 1  . famotidine (PEPCID) 20 MG tablet Take 1 tablet (20 mg total) by mouth 2 (two) times daily. 30 tablet 0  . fenofibrate 160 MG tablet Take 1 tablet (160 mg total) by mouth daily. 30 tablet 1  . hydrOXYzine (ATARAX/VISTARIL) 25 MG tablet Take 1 tablet (25 mg total) by mouth every 6 (six) hours as needed for anxiety. (Patient taking differently: Take 25 mg by mouth 3 (three) times daily. ) 12 tablet 0  . levothyroxine (SYNTHROID, LEVOTHROID) 50 MCG tablet Take 1 tablet (50 mcg total) by mouth daily before breakfast. 30 tablet 1  . oxybutynin (DITROPAN-XL) 5 MG 24 hr tablet Take 1 tablet (5 mg total) by mouth daily. 30 tablet 1  . pantoprazole (PROTONIX) 40 MG tablet Take 1 tablet (40 mg total) by mouth daily. 30 tablet 1  . risperiDONE (RISPERDAL) 3 MG tablet Take 1 tablet (3 mg total) by mouth 2 (two) times daily. 60 tablet 1  . sucralfate (CARAFATE) 1 g tablet Take 1 tablet (1 g total) by mouth 4 (four) times daily -  with meals and at bedtime. (Patient taking differently: Take 1 g by mouth 3 (three) times daily. ) 120 tablet 1   PTA Medications: (Not in a hospital admission)   Musculoskeletal: Strength & Muscle Tone: within normal limits Gait & Station: normal Patient leans: N/A  Psychiatric Specialty Exam: Physical Exam  Nursing note and vitals reviewed. Constitutional: She is oriented to person, place, and time. She appears well-developed and well-nourished. No distress.  HENT:  Head: Normocephalic.  Eyes: EOM are normal.  Neck: Normal range of motion.  Cardiovascular: Normal rate.  Respiratory: Effort normal. No respiratory distress.  Musculoskeletal: Normal range of motion.  Neurological: She is alert and oriented to person, place, and time.  Psychiatric: Her speech is normal. Her affect is blunt. She is slowed.    Review of Systems  Psychiatric/Behavioral: Negative for depression, hallucinations,  substance abuse and suicidal ideas. The patient  is not nervous/anxious.   All other systems reviewed and are negative.   Blood pressure 103/76, pulse 68, temperature 97.7 F (36.5 C), temperature source Oral, resp. rate 18, height 5\' 3"  (1.6 m), weight 95 kg, last menstrual period 05/28/2018, SpO2 99 %.Body mass index is 37.1 kg/m.  General Appearance: Casual  Eye Contact:  Good  Speech:  Slow  Volume:  Normal  Mood:  Euthymic  Affect:  Blunt  Thought Process:  Coherent and Descriptions of Associations: Intact  Orientation:  Full (Time, Place, and Person)  Thought Content:  Hallucinations: None and Rumination perseverative about moving towards [facility] to live near to family  Suicidal Thoughts:  No  Homicidal Thoughts:  No  Memory:  Immediate;   Good Recent;   Good Remote;   Good  Judgement:  Other:  Limited  Insight:  Shallow  Psychomotor Activity:  Normal  Concentration:  Concentration: Good and Attention Span: Good  Recall:  [employer] of Knowledge:  Fair  Language:  Good  Akathisia:  No  Handed:  Right  AIMS (if indicated):   0  Assets:  Resilience Social Support  ADL's:  Intact  Cognition:  WNL  Sleep:   Adequate     Demographic Factors:  Caucasian  Loss Factors: NA  Historical Factors: Impulsivity  Risk Reduction Factors:   Sense of responsibility to family, Living with another person, especially a relative, Positive social support and Positive therapeutic relationship  Continued Clinical Symptoms:  Anxiety, mild  Cognitive Features That Contribute To Risk:  None    Suicide Risk:  Minimal: No identifiable suicidal ideation.  Patients presenting with no risk factors but with morbid ruminations; may be classified as minimal risk based on the severity of the depressive symptoms   Plan Of Care/Follow-up recommendations:   Adjustment disorder with mixed disturbance of emotions and conduct: Schizoaffective disorder, depressive disorder: Klonopin  1 mg twice a day breakfast and dinner for anxiety Depakote ER 1000 mg at bedtime for mood stabilization Luvox 50 mg at bedtime for anxiety and depressive disorder Discontinue Lexapro to 10 mg starting 08/08/2018 with plan to increase Luvox as needed for depressive disorder and anxiety. Abilify 20 mg daily for depression augmentation  Discontinue Vistaril 50 mg -due to excessive sedation effect Discontinue Risperdal -due to dystonic symptoms  Asthma: -albuterol 2 puffs every hours Activity:  as tolerated Diet:  heart healthy diet   Other medical: Obtain UA today for urinary complaints. Start Macrodantin 100 mg daily for UTI prevention.  Disposition: Continue working with her ACT team/Social work for placement in another group home setting, as patient is unlikely to be successful living independently.  Patient is agreeable at this time to staying in emergency department to receive assistance from social work on housing.  Mariel Craft, MD 08/07/2018, 4:55 PM

## 2018-08-07 NOTE — Progress Notes (Signed)
11:16am - CSW contacted Oneal Grout (cell: [phone]) from [facility] to follow-up regarding group home placement for pt. Meriam Sprague stated that she currently does not have an opening at her group home.    [name], LCSW  [phone] [facility] ED

## 2018-08-08 DIAGNOSIS — N3000 Acute cystitis without hematuria: Secondary | ICD-10-CM

## 2018-08-08 DIAGNOSIS — F4325 Adjustment disorder with mixed disturbance of emotions and conduct: Secondary | ICD-10-CM | POA: Diagnosis not present

## 2018-08-08 DIAGNOSIS — F250 Schizoaffective disorder, bipolar type: Secondary | ICD-10-CM | POA: Diagnosis not present

## 2018-08-08 DIAGNOSIS — G3184 Mild cognitive impairment, so stated: Secondary | ICD-10-CM | POA: Diagnosis not present

## 2018-08-08 MED ORDER — SUCRALFATE 1 G PO TABS
1.0000 g | ORAL_TABLET | Freq: Four times a day (QID) | ORAL | Status: DC
  Administered 2018-08-09: 09:00:00 1 g via ORAL
  Filled 2018-08-08 (×5): qty 1

## 2018-08-08 MED ORDER — ONDANSETRON HCL 4 MG PO TABS
8.0000 mg | ORAL_TABLET | Freq: Three times a day (TID) | ORAL | Status: DC | PRN
  Administered 2018-08-08: 8 mg via ORAL
  Filled 2018-08-08 (×2): qty 2

## 2018-08-08 MED ORDER — FLUVOXAMINE MALEATE 50 MG PO TABS
100.0000 mg | ORAL_TABLET | Freq: Every day | ORAL | Status: DC
  Administered 2018-08-08: 21:00:00 100 mg via ORAL
  Filled 2018-08-08 (×2): qty 2

## 2018-08-08 MED ORDER — NITROFURANTOIN MACROCRYSTAL 50 MG PO CAPS
100.0000 mg | ORAL_CAPSULE | Freq: Two times a day (BID) | ORAL | Status: DC
  Administered 2018-08-08 – 2018-08-09 (×2): 100 mg via ORAL
  Filled 2018-08-08 (×4): qty 2

## 2018-08-08 MED ORDER — BENZTROPINE MESYLATE 1 MG PO TABS
2.0000 mg | ORAL_TABLET | Freq: Two times a day (BID) | ORAL | Status: DC
  Administered 2018-08-08 – 2018-08-09 (×3): 2 mg via ORAL
  Filled 2018-08-08 (×3): qty 2

## 2018-08-08 NOTE — ED Notes (Signed)
Patient refused to take a shower, when staff offered her a shower, patient stated "Im good"

## 2018-08-08 NOTE — Progress Notes (Addendum)
CSW was informed by psychiatrist to contact [facility] for assistance with finding this pt a group home.  CSW called their 1-800 number [phone]) and spoke with Bria. Bria stated that she will reach out to the [facility], and someone from there will reach out to CSW.    Update: [facility] will also follow-up with CSW to ask more questions about pt.   [name]  [facility] ED  [phone]

## 2018-08-08 NOTE — ED Notes (Signed)
Hourly rounding reveals patient in room. No complaints, stable, in no acute distress. Q15 minute rounds and monitoring via Security Cameras to continue. 

## 2018-08-08 NOTE — Consult Note (Signed)
[facility] Psych ED follow-up  08/08/2018 10:00 AM [name]  MRN:  [id number] Principal Problem: Adjustment disorder with mixed disturbance of emotions and conduct Discharge Diagnoses: Principal Problem:   Adjustment disorder with mixed disturbance of emotions and conduct  From initial intake: Plan:  Continuing to contact her care coordinator [name]) regarding placement with her family or a group home, discharged from her group home today as her 30 day notices was complete. She does not meet criteria for inpatient psychiatric hospitalization at this time.  Assessment: Reviewed the patient's previous notes, noted that she presented to the hospital after becoming frustrated that she no longer has housing as her group home has evicted her after a 30 day notice. According to her chart the patient has multiple similar presentations, which appear to thematically revolve around reported suicidality in the setting of poor frustration tolerance, intellectual disability and psychosocial stressors. She is currently denying suicidal or homicidal ideations and is fixated on finding somewhere to live. She requested to speak to a Child psychotherapist regarding assistance in placement. She denies AVH at this time. Reportedly this is her baseline, she does not appear to warrant inpatient psychiatric hospitalization at this time and I will rescind her IVC so that SW can work with her and her ACT team for community placement.  Patient is seen, chart is reviewed, collateral from patient's CST team and care coordinator. Total Time spent with patient: 20 minutes  Past Psychiatric History: schizoaffective disorder, intellectual disability  On evaluation today, patient is calm and cooperative.  She is becoming anxious to find a new group home, but is agreeable to stay in hospital while her care coordinator assists.  Patient complains of right lower back pain, she has continued to have significant incontinence which is now  foul-smelling.  Patient denies SI, HI, AVH today.  Collateral obtained from patient's care coordinator, [name] 08/06/2018: Care coordinator is working on arranging interviews with group homes for [name] in order to discharge directly to a new living environment.  Collateral obtained from [facility] 08/05/2018 CST from Psychotherapeutic Services [phone]) who expresses concern that patient is unable to adequately care for herself and unreliable and managing her own medications and finances.  He states that there has been a petition for patient to be placed under guardianship of the state, however this has not been completed.  There is a group home that is interested in interviewing [name], and arrangements are being made if patient is willing to stay in hospital to allow assistance for housing options.   Past Medical History:  Past Medical History:  Diagnosis Date  . Anxiety   . Asthma   . Borderline personality disorder (HCC)   . Cerebral palsy (HCC)   . COPD (chronic obstructive pulmonary disease) (HCC)   . Depression   . GERD (gastroesophageal reflux disease)   . Mild cognitive impairment   . Schizoaffective disorder (HCC)   . Wound abscess     Past Surgical History:  Procedure Laterality Date  . CHOLECYSTECTOMY    . INCISION AND DRAINAGE ABSCESS Right 01/02/2015   Procedure: INCISION AND DRAINAGE ABSCESS;  Surgeon: Tiney Rouge III, MD;  Location: [facility] ORS;  Service: General;  Laterality: Right;  . TONSILLECTOMY     Family History:  Family History  Problem Relation Age of Onset  . Heart attack Father   . Diabetes Mother    Family Psychiatric  History: none  Social History:  Patient is essentially homeless at this time, as she previously was residing  in a group home which had provided her 30-day notice.  Her 30-day notice is completed.  Social History   Substance and Sexual Activity  Alcohol Use No     Social History   Substance and Sexual Activity  Drug  Use No    Social History   Socioeconomic History  . Marital status: Single    Spouse name: Not on file  . Number of children: Not on file  . Years of education: Not on file  . Highest education level: Not on file  Occupational History  . Occupation: Disabled  Social Needs  . Financial resource strain: Not on file  . Food insecurity:    Worry: Not on file    Inability: Not on file  . Transportation needs:    Medical: Not on file    Non-medical: Not on file  Tobacco Use  . Smoking status: Former Games developermoker  . Smokeless tobacco: Never Used  Substance and Sexual Activity  . Alcohol use: No  . Drug use: No  . Sexual activity: Not Currently  Lifestyle  . Physical activity:    Days per week: Not on file    Minutes per session: Not on file  . Stress: Not on file  Relationships  . Social connections:    Talks on phone: Not on file    Gets together: Not on file    Attends religious service: Not on file    Active member of club or organization: Not on file    Attends meetings of clubs or organizations: Not on file    Relationship status: Not on file  Other Topics Concern  . Not on file  Social History Narrative   Pt lives with mother, nephew, sister in law    Has this patient used any form of tobacco in the last 30 days? (Cigarettes, Smokeless Tobacco, Cigars, and/or Pipes) NA  Current Medications: Current Facility-Administered Medications  Medication Dose Route Frequency Provider Last Rate Last Dose  . ARIPiprazole (ABILIFY) tablet 20 mg  20 mg Oral Daily Mariel CraftMaurer, Tarell Schollmeyer M, MD   20 mg at 08/07/18 1005  . clonazePAM (KLONOPIN) tablet 1 mg  1 mg Oral BID Mariel CraftMaurer, Zaryah Seckel M, MD   1 mg at 08/07/18 1646  . divalproex (DEPAKOTE ER) 24 hr tablet 1,000 mg  1,000 mg Oral QHS Mariel CraftMaurer, Dajah Fischman M, MD   1,000 mg at 08/07/18 2115  . famotidine (PEPCID) tablet 20 mg  20 mg Oral BID Charm RingsLord, Jamison Y, NP   20 mg at 08/07/18 2116  . fenofibrate tablet 160 mg  160 mg Oral Daily Charm RingsLord, Jamison Y, NP    160 mg at 08/07/18 1006  . fluvoxaMINE (LUVOX) tablet 50 mg  50 mg Oral QHS Mariel CraftMaurer, Shiquan Mathieu M, MD   50 mg at 08/07/18 2115  . levothyroxine (SYNTHROID) tablet 50 mcg  50 mcg Oral QAC breakfast Charm RingsLord, Jamison Y, NP   50 mcg at 08/07/18 0841  . LORazepam (ATIVAN) tablet 1 mg  1 mg Oral Q6H PRN Mariel CraftMaurer, Antawan Mchugh M, MD       Or  . LORazepam (ATIVAN) injection 1 mg  1 mg Intramuscular Q6H PRN Mariel CraftMaurer, Rainie Crenshaw M, MD      . nitrofurantoin (MACRODANTIN) capsule 100 mg  100 mg Oral Q1500 Mariel CraftMaurer, Keyonna Comunale M, MD   100 mg at 08/07/18 1534  . OLANZapine (ZYPREXA) tablet 5 mg  5 mg Oral Q8H PRN Charm RingsLord, Jamison Y, NP   5 mg at 08/05/18 1023  . oxybutynin (DITROPAN-XL) 24  hr tablet 5 mg  5 mg Oral Daily Charm Rings, NP   5 mg at 08/07/18 1006  . pantoprazole (PROTONIX) EC tablet 40 mg  40 mg Oral Daily Charm Rings, NP   40 mg at 08/07/18 1006  . sucralfate (CARAFATE) tablet 1 g  1 g Oral TID WC & HS Charm Rings, NP   1 g at 08/07/18 2116   Current Outpatient Medications  Medication Sig Dispense Refill  . clonazePAM (KLONOPIN) 0.5 MG tablet Take 1 tablet (0.5 mg total) by mouth 2 (two) times daily. 60 tablet 1  . divalproex (DEPAKOTE) 500 MG DR tablet Take 1 tablet (500 mg total) by mouth 2 (two) times daily. 60 tablet 1  . escitalopram (LEXAPRO) 20 MG tablet Take 1 tablet (20 mg total) by mouth daily. 30 tablet 1  . famotidine (PEPCID) 20 MG tablet Take 1 tablet (20 mg total) by mouth 2 (two) times daily. 30 tablet 0  . fenofibrate 160 MG tablet Take 1 tablet (160 mg total) by mouth daily. 30 tablet 1  . hydrOXYzine (ATARAX/VISTARIL) 25 MG tablet Take 1 tablet (25 mg total) by mouth every 6 (six) hours as needed for anxiety. (Patient taking differently: Take 25 mg by mouth 3 (three) times daily. ) 12 tablet 0  . levothyroxine (SYNTHROID, LEVOTHROID) 50 MCG tablet Take 1 tablet (50 mcg total) by mouth daily before breakfast. 30 tablet 1  . oxybutynin (DITROPAN-XL) 5 MG 24 hr tablet Take 1 tablet (5 mg total) by  mouth daily. 30 tablet 1  . pantoprazole (PROTONIX) 40 MG tablet Take 1 tablet (40 mg total) by mouth daily. 30 tablet 1  . risperiDONE (RISPERDAL) 3 MG tablet Take 1 tablet (3 mg total) by mouth 2 (two) times daily. 60 tablet 1  . sucralfate (CARAFATE) 1 g tablet Take 1 tablet (1 g total) by mouth 4 (four) times daily -  with meals and at bedtime. (Patient taking differently: Take 1 g by mouth 3 (three) times daily. ) 120 tablet 1   PTA Medications: (Not in a hospital admission)   Musculoskeletal: Strength & Muscle Tone: within normal limits Gait & Station: normal Patient leans: N/A  Psychiatric Specialty Exam: Physical Exam  Nursing note and vitals reviewed. Constitutional: She is oriented to person, place, and time. She appears well-developed and well-nourished. No distress.  HENT:  Head: Normocephalic.  Eyes: EOM are normal.  Neck: Normal range of motion.  Cardiovascular: Normal rate.  Respiratory: Effort normal. No respiratory distress.  Musculoskeletal: Normal range of motion.  Neurological: She is alert and oriented to person, place, and time.  Psychiatric: Her speech is normal. Her affect is blunt. She is slowed.    Review of Systems  Psychiatric/Behavioral: Negative for depression, hallucinations, substance abuse and suicidal ideas. The patient is not nervous/anxious.   All other systems reviewed and are negative.   Blood pressure (!) 98/52, pulse 62, temperature 98.2 F (36.8 C), temperature source Oral, resp. rate 16, height  (1.6 m), weight 95 kg, last menstrual period 05/28/2018, SpO2 97 %.Body mass index is 37.1 kg/m.  General Appearance: Casual  Eye Contact:  Good  Speech:  Slow  Volume:  Normal  Mood:  Euthymic  Affect:  Blunt  Thought Process:  Coherent and Descriptions of Associations: Intact  Orientation:  Full (Time, Place, and Person)  Thought Content:  Hallucinations: None and Rumination perseverative about moving towards [facility] to live  near to family  Suicidal Thoughts:  No  Homicidal Thoughts:  No  Memory:  Immediate;   Good Recent;   Good Remote;   Good  Judgement:  Other:  Limited  Insight:  Shallow  Psychomotor Activity:  Normal  Concentration:  Concentration: Good and Attention Span: Good  Recall:  [employer] of Knowledge:  Fair  Language:  Good  Akathisia:  No  Handed:  Right  AIMS (if indicated):   0  Assets:  Resilience Social Support  ADL's:  Intact  Cognition:  WNL  Sleep:   Adequate     Demographic Factors:  Caucasian  Loss Factors: NA  Historical Factors: Impulsivity  Risk Reduction Factors:   Sense of responsibility to family, Living with another person, especially a relative, Positive social support and Positive therapeutic relationship  Continued Clinical Symptoms:  Anxiety, mild  Cognitive Features That Contribute To Risk:  None    Suicide Risk:  Minimal: No identifiable suicidal ideation.  Patients presenting with no risk factors but with morbid ruminations; may be classified as minimal risk based on the severity of the depressive symptoms   Plan Of Care/Follow-up recommendations:   Adjustment disorder with mixed disturbance of emotions and conduct: Schizoaffective disorder, depressive disorder: Klonopin 1 mg twice a day breakfast and dinner for anxiety Depakote ER 1000 mg at bedtime for mood stabilization Increase Luvox 100 mg at bedtime for anxiety and depressive disorder Abilify 20 mg daily for depression augmentation  Discontinue Vistaril 50 mg -due to excessive sedation effect Discontinue Risperdal -due to dystonic symptoms  Asthma: -albuterol 2 puffs every hours Activity:  as tolerated Diet:  heart healthy diet   Other medical: Suspect UTI, have been unable to successfully obtain a UA. Prescribe Macrobid 100 mg twice daily for 7 days then transition to Macrodantin 100 mg daily for UTI prevention.  Discontinue Ditropan XL, as this has been ineffective for  urinary incontinence. Start Cogentin 2 mg twice daily for EPS prevention, and to maximize anticholinergic effect for assistance in decreasing urinary incontinence.  We will encourage patient to make urological/gynecology appointment after discharge.  Disposition: Continue working with her ACT team/Social work for placement in another group home setting, as patient is unlikely to be successful living independently.  Patient is agreeable at this time to staying in emergency department to receive assistance from social work on housing.  Mariel Craft, MD 08/08/2018, 10:00 AM

## 2018-08-08 NOTE — ED Notes (Signed)
Patient c/o chest and stomach pain. She said 8/10 stabbing pain. She also asked to leave the hospital tonight. MD notified.

## 2018-08-08 NOTE — ED Notes (Signed)
Hourly rounding reveals patient sleeping in room. No complaints, stable, in no acute distress. Q15 minute rounds and monitoring via Security Cameras to continue. 

## 2018-08-08 NOTE — ED Notes (Addendum)
Patient talking with her community support advocate Florencio [employer]) and she discussed with him that she wants to be transferred to [facility], Flo discussed with her that they are working on another group home ([facility]), he informed her that its been hard to get her placed. He also informed client that he has no way to get her to [facility], he also discussed that he did not want her discharged because he is scared that she will call 911 and they will bring her back here, this was explained to client by Clinical research associate and Florencio. Patient continued to say she was ready to be transferred and she is leaving tomorrow she is her own legal guardian.   Florencio- [phone], he wants to know if patient can Facetime or Zoom with him tomorrow, since he cant come and visit

## 2018-08-08 NOTE — ED Provider Notes (Signed)
-----------------------------------------   7:10 AM on 08/08/2018 -----------------------------------------   Blood pressure (!) 98/52, pulse 62, temperature 98.2 F (36.8 C), temperature source Oral, resp. rate 16, height 5\' 3"  (1.6 m), weight 95 kg, last menstrual period 05/28/2018, SpO2 97 %.  The patient is calm and cooperative at this time.  There have been no acute events since the last update.  Awaiting disposition plan from Behavioral Medicine team.    Jeanmarie Plant, MD 08/08/18 [phone]

## 2018-08-08 NOTE — ED Notes (Signed)
Report to include Situation, Background, Assessment, and Recommendations received from [facility]. Patient alert and oriented, warm and dry, in no acute distress. Patient denies SI, HI, AVH and pain. Patient made aware of Q15 minute rounds and Psychologist, counselling presence for their safety. Patient instructed to come to me with needs or concerns.

## 2018-08-09 ENCOUNTER — Other Ambulatory Visit: Payer: Self-pay

## 2018-08-09 ENCOUNTER — Emergency Department
Admission: EM | Admit: 2018-08-09 | Discharge: 2018-09-16 | Disposition: A | Discharge status: Home / Self Care | Payer: Medicare Other | Attending: Emergency Medicine | Admitting: Emergency Medicine

## 2018-08-09 DIAGNOSIS — Z880 Allergy status to penicillin: Secondary | ICD-10-CM | POA: Insufficient documentation

## 2018-08-09 DIAGNOSIS — Z590 Homelessness: Secondary | ICD-10-CM | POA: Insufficient documentation

## 2018-08-09 DIAGNOSIS — F250 Schizoaffective disorder, bipolar type: Secondary | ICD-10-CM | POA: Diagnosis not present

## 2018-08-09 DIAGNOSIS — Z915 Personal history of self-harm: Secondary | ICD-10-CM | POA: Diagnosis not present

## 2018-08-09 DIAGNOSIS — F70 Mild intellectual disabilities: Secondary | ICD-10-CM | POA: Diagnosis present

## 2018-08-09 DIAGNOSIS — G809 Cerebral palsy, unspecified: Secondary | ICD-10-CM | POA: Diagnosis not present

## 2018-08-09 DIAGNOSIS — Z87891 Personal history of nicotine dependence: Secondary | ICD-10-CM | POA: Diagnosis not present

## 2018-08-09 DIAGNOSIS — Z046 Encounter for general psychiatric examination, requested by authority: Secondary | ICD-10-CM | POA: Diagnosis present

## 2018-08-09 DIAGNOSIS — F4325 Adjustment disorder with mixed disturbance of emotions and conduct: Secondary | ICD-10-CM | POA: Diagnosis not present

## 2018-08-09 DIAGNOSIS — R4189 Other symptoms and signs involving cognitive functions and awareness: Secondary | ICD-10-CM | POA: Insufficient documentation

## 2018-08-09 DIAGNOSIS — F329 Major depressive disorder, single episode, unspecified: Secondary | ICD-10-CM | POA: Insufficient documentation

## 2018-08-09 DIAGNOSIS — Z79899 Other long term (current) drug therapy: Secondary | ICD-10-CM | POA: Diagnosis not present

## 2018-08-09 DIAGNOSIS — Z1159 Encounter for screening for other viral diseases: Secondary | ICD-10-CM | POA: Insufficient documentation

## 2018-08-09 DIAGNOSIS — R45851 Suicidal ideations: Secondary | ICD-10-CM

## 2018-08-09 DIAGNOSIS — F603 Borderline personality disorder: Secondary | ICD-10-CM | POA: Diagnosis not present

## 2018-08-09 DIAGNOSIS — F79 Unspecified intellectual disabilities: Secondary | ICD-10-CM | POA: Diagnosis not present

## 2018-08-09 DIAGNOSIS — K219 Gastro-esophageal reflux disease without esophagitis: Secondary | ICD-10-CM | POA: Diagnosis present

## 2018-08-09 DIAGNOSIS — J449 Chronic obstructive pulmonary disease, unspecified: Secondary | ICD-10-CM | POA: Insufficient documentation

## 2018-08-09 LAB — CBC
HCT: 38 % (ref 36.0–46.0)
Hemoglobin: 11.9 g/dL — ABNORMAL LOW (ref 12.0–15.0)
MCH: 31.8 pg (ref 26.0–34.0)
MCHC: 31.3 g/dL (ref 30.0–36.0)
MCV: 101.6 fL — ABNORMAL HIGH (ref 80.0–100.0)
Platelets: 345 10*3/uL (ref 150–400)
RBC: 3.74 MIL/uL — ABNORMAL LOW (ref 3.87–5.11)
RDW: 14.2 % (ref 11.5–15.5)
WBC: 8.8 10*3/uL (ref 4.0–10.5)
nRBC: 0 % (ref 0.0–0.2)

## 2018-08-09 LAB — COMPREHENSIVE METABOLIC PANEL
ALT: 19 U/L (ref 0–44)
AST: 24 U/L (ref 15–41)
Albumin: 3.7 g/dL (ref 3.5–5.0)
Alkaline Phosphatase: 50 U/L (ref 38–126)
Anion gap: 9 (ref 5–15)
BUN: 27 mg/dL — ABNORMAL HIGH (ref 6–20)
CO2: 28 mmol/L (ref 22–32)
Calcium: 9.3 mg/dL (ref 8.9–10.3)
Chloride: 103 mmol/L (ref 98–111)
Creatinine, Ser: 0.92 mg/dL (ref 0.44–1.00)
GFR calc Af Amer: >60 mL/min (ref >60)
GFR calc non Af Amer: >60 mL/min (ref >60)
Glucose, Bld: 91 mg/dL (ref 70–99)
Potassium: 3.9 mmol/L (ref 3.5–5.1)
Sodium: 140 mmol/L (ref 135–145)
Total Bilirubin: 0.6 mg/dL (ref 0.3–1.2)
Total Protein: 7.5 g/dL (ref 6.5–8.1)

## 2018-08-09 LAB — ETHANOL: Alcohol, Ethyl (B): <10 mg/dL (ref <10)

## 2018-08-09 LAB — SALICYLATE LEVEL: Salicylate Lvl: <7 mg/dL (ref 2.8–30.0)

## 2018-08-09 LAB — ACETAMINOPHEN LEVEL: Acetaminophen (Tylenol), Serum: <10 ug/mL — ABNORMAL LOW (ref 10–30)

## 2018-08-09 MED ORDER — NITROFURANTOIN MACROCRYSTAL 100 MG PO CAPS: 100.0000 mg | ORAL_CAPSULE | Freq: Two times a day (BID) | ORAL | 0 refills | Status: DC

## 2018-08-09 MED ORDER — SUCRALFATE 1 G PO TABS: 1.0000 g | ORAL_TABLET | Freq: Three times a day (TID) | ORAL | 1 refills | Status: DC

## 2018-08-09 MED ORDER — FLUVOXAMINE MALEATE 100 MG PO TABS: 100.0000 mg | ORAL_TABLET | Freq: Every day | ORAL | 0 refills | Status: DC

## 2018-08-09 MED ORDER — FENOFIBRATE 160 MG PO TABS: 160.0000 mg | ORAL_TABLET | Freq: Every day | ORAL | 1 refills | Status: DC

## 2018-08-09 MED ORDER — OLANZAPINE 5 MG PO TABS: 5.0000 mg | ORAL_TABLET | Freq: Three times a day (TID) | ORAL | 0 refills | Status: DC | PRN

## 2018-08-09 MED ORDER — DIVALPROEX SODIUM ER 500 MG PO TB24: 1000.0000 mg | ORAL_TABLET | Freq: Every day | ORAL | 0 refills | Status: DC

## 2018-08-09 MED ORDER — LEVOTHYROXINE SODIUM 50 MCG PO TABS: 50.0000 ug | ORAL_TABLET | Freq: Every day | ORAL | 1 refills | Status: DC

## 2018-08-09 MED ORDER — FAMOTIDINE 20 MG PO TABS: 20.0000 mg | ORAL_TABLET | Freq: Two times a day (BID) | ORAL | 0 refills | Status: DC

## 2018-08-09 MED ORDER — CLONAZEPAM 1 MG PO TABS: 1.0000 mg | ORAL_TABLET | Freq: Two times a day (BID) | ORAL | 0 refills | Status: DC

## 2018-08-09 MED ORDER — PANTOPRAZOLE SODIUM 40 MG PO TBEC: 40.0000 mg | DELAYED_RELEASE_TABLET | Freq: Every day | ORAL | 1 refills | Status: DC

## 2018-08-09 MED ORDER — ARIPIPRAZOLE 20 MG PO TABS: 20.0000 mg | ORAL_TABLET | Freq: Every day | ORAL | 0 refills | Status: DC

## 2018-08-09 MED ORDER — BENZTROPINE MESYLATE 2 MG PO TABS: 2.0000 mg | ORAL_TABLET | Freq: Two times a day (BID) | ORAL | 0 refills | Status: DC

## 2018-08-09 NOTE — ED Notes (Signed)
Hourly rounding reveals patient in room. No complaints, stable, in no acute distress. Q15 minute rounds and monitoring via Security Cameras to continue. 

## 2018-08-09 NOTE — ED Notes (Signed)
Florencio- [phone] Support Team) called and wanted to know what the patient's plan was, to go to a hotel or to a shelter.  She stated she wanted to go to a hotel.  Told Florencio patient's plan was to go to a hotel.  He stated that he was working on getting money from the patient's payee.

## 2018-08-09 NOTE — ED Notes (Signed)
Pt. Asked are you SI or do you have no place to go.  Pt. States "I don't have anywhere to go.  Pt. Told we can try to get you into a shelter.  Pt. States "I don't want to go to the shelter they are mean".  Pt. States "I want to go back to hotel".  Pt. Asked "Do you have money for hotel and transportation".  Pt. States "I have some money".  Pt. Given phone to make a call.

## 2018-08-09 NOTE — Discharge Instructions (Signed)
Living With Schizoaffective Disorder If you have been diagnosed with schizoaffective disorder (ScAD), you may be relieved to know why you have felt or behaved a certain way. You may also feel overwhelmed about the treatment ahead, how to get the support you need, and how to deal with the condition day-to-day. With care and support, you can learn to manage your symptoms and live with ScAD. ScAD is a lifelong chronic condition that may occur in cycle. Periods of severe symptoms may be followed by periods of less severe symptoms or improvement. If you are living with ScAD, there are steps that you can take to help manage the condition and make your life better. How to manage lifestyle changes Managing stress Stress is your body's reaction to life changes and events, both good and bad. For people with ScAD, stress can cause more severe symptoms to start (can be a trigger), so it is important to learn ways to deal with stress. Your health care provider, therapist, or counselor may suggest techniques such as:  Meditation, muscle relaxation, and breathing exercises.  Music therapy. This can include creating music or listening to music.  Life skills training. This training is focused on work, self-care, money, house management, and social skills. Other things you can do to cope with stress include:  Keeping a stress diary. This can help you learn what causes your stress to start and how you can control your response to those triggers.  Exercising. Even a short daily walk can help.  Getting enough sleep.  Making a schedule to manage your time. Knowing what you will do from day to day helps you avoid feeling overwhelmed by tasks and deadlines.  Spending time on hobbies you enjoy that help you relax.  Medicines Your health care provider is likely to prescribe various types of medicine depending on your symptoms. These may include one or more of the following types:  Antipsychotics.  Mood  stabilizers.  Antidepressants. Make sure you:  Talk with your pharmacist or health care provider about all medicines that you take, the possible side effects, and which medicines are safe to take together.  Make it your goal to take part in all treatment decisions (shared decision-making). Ask about possible side effects of medicines that your health care provider recommends, and tell him or her how you feel about having those side effects. It is best if shared decision-making with your health care provider is part of your total treatment plan. Relationships Having the support of your family and friends can play a major role in the success of your treatment. The following steps can help you maintain healthy relationships:  Think about going to couples therapy, family therapy, or family education classes.  Create a written plan for your treatment, and include close family members and friends in the process.  Consider bringing your partner or another family member or friend to the appointments you have with your health care provider. How to recognize changes in your condition If you find that your condition is getting worse, talk to your health care provider right away. Watch for these signs:  Your mood becomes extreme with either emotional highs or the intense lows of depression.  Your speech becomes unclear.  You are disorganized, show the wrong social behaviors, or withdraw from social activities.  You have racing thoughts and have trouble thinking clearly or staying focused.  You hear, see, taste, and believe things that others do not.  You have poor personal hygiene, weight gain or weight  loss, or changes in how you are sleeping or eating. °Where to find support °Talking to others °· Reach out to trusted friends or family members, explain your condition, and let them know that you are working with a health care team. °· Consider giving educational materials to friends and  family. °· If you are having trouble telling your friends and family about your condition, keep in mind that honest and open communication can make these conversations easier. °Finances °Be sure to check with your insurance carrier to find out what treatment options are covered by your plan. You may also be able to find financial assistance through not-for-profit organizations or with local government-based resources. °If you are taking medicines, you may be able to get the generic form, which may be less expensive than brand-name medicine. Some makers of prescription medicines also offer help to patients who cannot afford the medicines that they need. °Therapy and support groups °· Make sure you find a counselor or therapist who is familiar with ScAD. Meet with your counselor or therapist once a week or more often if needed. °· Find support programs for people with ScAD, such as local groups associated with the National Alliance on Mental Illness. You can begin your search here: www.nami.org °Follow these instructions at home: °· Take over-the-counter and prescription medicines only as told by your health care provider. Do not start new medicines or stop taking medicines before you ask your health care provider if it is safe to make those changes. °· Avoid caffeine, alcohol, and drugs. They can affect how your medicine works and can make your symptoms worse. °· Eat a healthy diet. °· Keep all follow-up visits as told by your health care provider, therapist, or counselor. This is important. °· Look for support groups in your area so you can meet other people with your condition and learn new coping methods. °Contact a health care provider if: °· You are not able to take your medicines as prescribed. °· Your symptoms get worse. °Get help right away if: °· You have serious thoughts about hurting yourself or others. °If you ever feel like you may hurt yourself or others, or have thoughts about taking your own life, get  help right away. You can go to your nearest emergency department or call: °· Your local emergency services (911 in the [place] °· A suicide crisis helpline, such as the National Suicide Prevention Lifeline at 1-800-273-8255. This is open 24 hours a day. °Summary °· Schizoaffective disorder (ScAD) is a lifelong chronic illness. It is best controlled with continuous treatment that includes medicine and therapy. °· Learning ways to deal with stress may help your treatment work better. °· Having the support of your family and friends can be a key to making your treatment a success. °· If you find that your condition is getting worse, talk to your health care provider right away. °This information is not intended to replace advice given to you by your health care provider. Make sure you discuss any questions you have with your health care provider. °Document Released: 08/03/2016 Document Revised: 08/03/2016 Document Reviewed: 08/03/2016 °Elsevier Interactive Patient Education © 2019 Elsevier Inc. ° °

## 2018-08-09 NOTE — ED Provider Notes (Signed)
-----------------------------------------   6:13 AM on 08/09/2018 -----------------------------------------   Blood pressure 137/83, pulse 70, temperature (!) 97.5 F (36.4 C), temperature source Oral, resp. rate 18, height 5\' 3"  (1.6 m), weight 95 kg, last menstrual period 05/28/2018, SpO2 98 %.  The patient is sleeping at this time.  There have been no acute events since the last update.  Awaiting disposition plan from Behavioral Medicine team.   Irean Hong, MD 08/09/18 [phone]

## 2018-08-09 NOTE — ED Notes (Signed)
Florencio- [phone] [facility] Support Team) called and stated he was running a little late because he was trying to get his community support person to book the patient a hotel room.  He stated that he was still coming to pick up the patient.

## 2018-08-09 NOTE — Consult Note (Signed)
[facility] Psych ED follow-up  08/09/2018 8:23 AM [name]  MRN:  [id number] Principal Problem: Adjustment disorder with mixed disturbance of emotions and conduct Discharge Diagnoses: Principal Problem:   Adjustment disorder with mixed disturbance of emotions and conduct Active Problems:   Borderline personality disorder (HCC)   Schizoaffective disorder, bipolar type (HCC)  From initial intake: Plan:  Continuing to contact her care coordinator [name]) regarding placement with her family or a group home, discharged from her group home as her 30 day notices was complete. She does not meet criteria for inpatient psychiatric hospitalization at this time.  Assessment per Dr Viviano Simas:  Reviewed the patient's previous notes, noted that she presented to the hospital after becoming frustrated that she no longer has housing as her group home has evicted her after a 30 day notice. According to her chart the patient has multiple similar presentations, which appear to thematically revolve around reported suicidality in the setting of poor frustration tolerance, intellectual disability and psychosocial stressors. She is currently denying suicidal or homicidal ideations and is fixated on finding somewhere to live. She requested to speak to a Child psychotherapist regarding assistance in placement. She denies AVH at this time. Reportedly this is her baseline, she does not appear to warrant inpatient psychiatric hospitalization at this time and I will rescind her IVC so that SW can work with her and her ACT team for community placement.  Today, patient was seen and chart reviewed.  Patient is cooperative, anxious at times regarding her pending placement.  Denies any pain.  She has no suicidal/homicidal ideations, hallucinations, or substance abuse issues.  Ms. Constantin is awaiting social work placement, potential lead at this time is [facility].  She is psychiatrically cleared but does not have the ability with her  cognitive disability to live independently.  Continue to await placement visa social work.  Total Time spent with patient: 20 minutes  Past Psychiatric History: schizoaffective disorder, intellectual disability  Collateral from TTS:  08/09/18:  Potential lead with [facility].  Collateral obtained from patient's care coordinator, [name] 08/06/2018: Care coordinator is working on arranging interviews with group homes for [facility] in order to discharge directly to a new living environment.  Collateral obtained from [facility] 08/05/2018 CST from Psychotherapeutic Services [phone]) who expresses concern that patient is unable to adequately care for herself and unreliable and managing her own medications and finances.  He states that there has been a petition for patient to be placed under guardianship of the state, however this has not been completed.  There is a group home that is interested in interviewing [facility], and arrangements are being made if patient is willing to stay in hospital to allow assistance for housing options.  Past Medical History:  Past Medical History:  Diagnosis Date  . Anxiety   . Asthma   . Borderline personality disorder (HCC)   . Cerebral palsy (HCC)   . COPD (chronic obstructive pulmonary disease) (HCC)   . Depression   . GERD (gastroesophageal reflux disease)   . Mild cognitive impairment   . Schizoaffective disorder (HCC)   . Wound abscess     Past Surgical History:  Procedure Laterality Date  . CHOLECYSTECTOMY    . INCISION AND DRAINAGE ABSCESS Right 01/02/2015   Procedure: INCISION AND DRAINAGE ABSCESS;  Surgeon: Tiney Rouge III, MD;  Location: [facility] ORS;  Service: General;  Laterality: Right;  . TONSILLECTOMY     Family History:  Family History  Problem Relation Age of  Onset  . Heart attack Father   . Diabetes Mother    Family Psychiatric  History: none  Social History:  Patient is essentially homeless at this time, as she  previously was residing in a group home which had provided her 30-day notice.  Her 30-day notice is completed.  Social History   Substance and Sexual Activity  Alcohol Use No     Social History   Substance and Sexual Activity  Drug Use No    Social History   Socioeconomic History  . Marital status: Single    Spouse name: Not on file  . Number of children: Not on file  . Years of education: Not on file  . Highest education level: Not on file  Occupational History  . Occupation: Disabled  Social Needs  . Financial resource strain: Not on file  . Food insecurity:    Worry: Not on file    Inability: Not on file  . Transportation needs:    Medical: Not on file    Non-medical: Not on file  Tobacco Use  . Smoking status: Former Games developer  . Smokeless tobacco: Never Used  Substance and Sexual Activity  . Alcohol use: No  . Drug use: No  . Sexual activity: Not Currently  Lifestyle  . Physical activity:    Days per week: Not on file    Minutes per session: Not on file  . Stress: Not on file  Relationships  . Social connections:    Talks on phone: Not on file    Gets together: Not on file    Attends religious service: Not on file    Active member of club or organization: Not on file    Attends meetings of clubs or organizations: Not on file    Relationship status: Not on file  Other Topics Concern  . Not on file  Social History Narrative   Pt lives with mother, nephew, sister in law    Has this patient used any form of tobacco in the last 30 days? (Cigarettes, Smokeless Tobacco, Cigars, and/or Pipes) NA  Current Medications: Current Facility-Administered Medications  Medication Dose Route Frequency Provider Last Rate Last Dose  . ARIPiprazole (ABILIFY) tablet 20 mg  20 mg Oral Daily Mariel Craft, MD   20 mg at 08/08/18 1050  . benztropine (COGENTIN) tablet 2 mg  2 mg Oral BID Mariel Craft, MD   2 mg at 08/08/18 2117  . clonazePAM (KLONOPIN) tablet 1 mg  1 mg  Oral BID Mariel Craft, MD   1 mg at 08/08/18 1630  . divalproex (DEPAKOTE ER) 24 hr tablet 1,000 mg  1,000 mg Oral QHS Mariel Craft, MD   1,000 mg at 08/08/18 2117  . famotidine (PEPCID) tablet 20 mg  20 mg Oral BID Charm Rings, NP   20 mg at 08/08/18 2118  . fenofibrate tablet 160 mg  160 mg Oral Daily Charm Rings, NP   160 mg at 08/08/18 1059  . fluvoxaMINE (LUVOX) tablet 100 mg  100 mg Oral QHS Mariel Craft, MD   100 mg at 08/08/18 2119  . levothyroxine (SYNTHROID) tablet 50 mcg  50 mcg Oral QAC breakfast Charm Rings, NP   50 mcg at 08/08/18 1054  . LORazepam (ATIVAN) tablet 1 mg  1 mg Oral Q6H PRN Mariel Craft, MD       Or  . LORazepam (ATIVAN) injection 1 mg  1 mg Intramuscular Q6H PRN Gretta Cool  M, MD      . nitrofurantoin (MACRODANTIN) capsule 100 mg  100 mg Oral BID Mariel Craft, MD   100 mg at 08/08/18 2119  . OLANZapine (ZYPREXA) tablet 5 mg  5 mg Oral Q8H PRN Charm Rings, NP   5 mg at 08/05/18 1023  . ondansetron (ZOFRAN) tablet 8 mg  8 mg Oral Q8H PRN Mariel Craft, MD   8 mg at 08/08/18 1050  . pantoprazole (PROTONIX) EC tablet 40 mg  40 mg Oral Daily Charm Rings, NP   40 mg at 08/08/18 1059  . sucralfate (CARAFATE) tablet 1 g  1 g Oral TID WC & HS Charm Rings, NP   1 g at 08/08/18 2118  . sucralfate (CARAFATE) tablet 1 g  1 g Oral Q6H Willy Eddy, MD       Current Outpatient Medications  Medication Sig Dispense Refill  . clonazePAM (KLONOPIN) 0.5 MG tablet Take 1 tablet (0.5 mg total) by mouth 2 (two) times daily. 60 tablet 1  . divalproex (DEPAKOTE) 500 MG DR tablet Take 1 tablet (500 mg total) by mouth 2 (two) times daily. 60 tablet 1  . escitalopram (LEXAPRO) 20 MG tablet Take 1 tablet (20 mg total) by mouth daily. 30 tablet 1  . famotidine (PEPCID) 20 MG tablet Take 1 tablet (20 mg total) by mouth 2 (two) times daily. 30 tablet 0  . fenofibrate 160 MG tablet Take 1 tablet (160 mg total) by mouth daily. 30 tablet 1  .  hydrOXYzine (ATARAX/VISTARIL) 25 MG tablet Take 1 tablet (25 mg total) by mouth every 6 (six) hours as needed for anxiety. (Patient taking differently: Take 25 mg by mouth 3 (three) times daily. ) 12 tablet 0  . levothyroxine (SYNTHROID, LEVOTHROID) 50 MCG tablet Take 1 tablet (50 mcg total) by mouth daily before breakfast. 30 tablet 1  . oxybutynin (DITROPAN-XL) 5 MG 24 hr tablet Take 1 tablet (5 mg total) by mouth daily. 30 tablet 1  . pantoprazole (PROTONIX) 40 MG tablet Take 1 tablet (40 mg total) by mouth daily. 30 tablet 1  . risperiDONE (RISPERDAL) 3 MG tablet Take 1 tablet (3 mg total) by mouth 2 (two) times daily. 60 tablet 1  . sucralfate (CARAFATE) 1 g tablet Take 1 tablet (1 g total) by mouth 4 (four) times daily -  with meals and at bedtime. (Patient taking differently: Take 1 g by mouth 3 (three) times daily. ) 120 tablet 1   PTA Medications: (Not in a hospital admission)   Musculoskeletal: Strength & Muscle Tone: within normal limits Gait & Station: normal Patient leans: N/A  Psychiatric Specialty Exam: Physical Exam  Nursing note and vitals reviewed. Constitutional: She is oriented to person, place, and time. She appears well-developed and well-nourished. No distress.  HENT:  Head: Normocephalic.  Eyes: EOM are normal.  Neck: Normal range of motion.  Cardiovascular: Normal rate.  Respiratory: Effort normal. No respiratory distress.  Musculoskeletal: Normal range of motion.  Neurological: She is alert and oriented to person, place, and time.  Psychiatric: Her speech is normal. Judgment and thought content normal. Her mood appears anxious. Her affect is blunt. She is slowed. Cognition and memory are impaired.    Review of Systems  Psychiatric/Behavioral: Negative for depression, hallucinations, substance abuse and suicidal ideas. The patient is nervous/anxious.   All other systems reviewed and are negative.   Blood pressure 137/83, pulse 70, temperature (!) 97.5 F  (36.4 C), temperature source Oral, resp.  rate 18, height 5\' 3"  (1.6 m), weight 95 kg, last menstrual period 05/28/2018, SpO2 98 %.Body mass index is 37.1 kg/m.  General Appearance: Casual  Eye Contact:  Good  Speech:  Slow  Volume:  Normal  Mood:  Anxious  Affect:  Blunt  Thought Process:  Coherent and Descriptions of Associations: Intact  Orientation:  Full (Time, Place, and Person)  Thought Content:  [place]  Suicidal Thoughts:  No  Homicidal Thoughts:  No  Memory:  Immediate;   Good Recent;   Good Remote;   Good  Judgement:  Other:  Limited  Insight:  Shallow  Psychomotor Activity:  Normal  Concentration:  Concentration: Good and Attention Span: Good  Recall:  [employer]  Fund of Knowledge:  Fair  Language:  Good  Akathisia:  No  Handed:  Right  AIMS (if indicated):   0  Assets:  Resilience Social Support  ADL's:  Intact  Cognition:  WNL  Sleep:   Adequate   Demographic Factors:  Caucasian  Loss Factors: NA  Historical Factors: Impulsivity  Risk Reduction Factors:   Sense of responsibility to family, Living with another person, especially a relative, Positive social support and Positive therapeutic relationship  Continued Clinical Symptoms:  Anxiety, mild  Cognitive Features That Contribute To Risk:  None    Suicide Risk:  Minimal: No identifiable suicidal ideation.  Patients presenting with no risk factors but with morbid ruminations; may be classified as minimal risk based on the severity of the depressive symptoms   Plan Of Care/Follow-up recommendations:   Adjustment disorder with mixed disturbance of emotions and conduct: Schizoaffective disorder, depressive disorder: Klonopin 1 mg twice a day breakfast and dinner for anxiety Depakote ER 1000 mg at bedtime for mood stabilization Increase Luvox 100 mg at bedtime for anxiety and depressive disorder Abilify 20 mg daily for depression  augmentation  Discontinue Vistaril 50 mg -due to excessive sedation effect Discontinue Risperdal -due to dystonic symptoms  Asthma: -albuterol 2 puffs every hours Activity:  as tolerated Diet:  heart healthy diet   Other medical: Suspect UTI, have been unable to successfully obtain a UA. Continue Macrobid 100 mg twice daily for 7 days then transition to Macrodantin 100 mg daily for UTI prevention.  UTI: Continued Cogentin 2 mg twice daily for EPS prevention, and to maximize anticholinergic effect for assistance in decreasing urinary incontinence.  We will encourage patient to make urological/gynecology appointment after discharge.  Disposition: Continue working with her ACT team/Social work for placement in another group home setting, as patient is unlikely to be successful living independently.  Patient is agreeable at this time to staying in emergency department to receive assistance from social work on housing.  Nanine MeansJamison Eliazer Hemphill, NP 08/09/2018, 8:23 AM

## 2018-08-09 NOTE — ED Notes (Signed)
Pt repeatedly standing at the nurses station asking to the speak to the nurse.  Stating that she is ready to leave the hospital.  Stating, "I want to leave!"

## 2018-08-09 NOTE — ED Provider Notes (Signed)
[facility] Emergency Department Provider Note  Time seen: 9:31 PM  I have reviewed the triage vital signs and the nursing notes.   HISTORY  Chief Complaint Back Pain and Psychiatric Evaluation   HPI [name] is a 53 y.o. female past medical history of anxiety, COPD, cerebral palsy, cognitive impairment, schizoaffective, presents to the emergency department for suicidal ideation.  According to the patient she got kicked out of her group home, stated a hotel last night, but did not have money for tonight so she came to the emergency department.  States he is suicidal because she does not want to go to a homeless shelter.  Denies any HI.  Triage nurse did note possibility of bedbugs.  Patient has no medical complaints at this time.  Denies any cough or fever.    Past Medical History:  Diagnosis Date  . Anxiety   . Asthma   . Borderline personality disorder (HCC)   . Cerebral palsy (HCC)   . COPD (chronic obstructive pulmonary disease) (HCC)   . Depression   . GERD (gastroesophageal reflux disease)   . Mild cognitive impairment   . Schizoaffective disorder (HCC)   . Wound abscess     Patient Active Problem List   Diagnosis Date Noted  . Cognitive impairment   . Suicidal ideation   . Suicidal behavior without attempted self-injury 07/23/2018  . Rash 07/22/2018  . 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 Overdose by acetaminophen 08/08/2016  . Schizoaffective disorder, bipolar type (HCC)   . 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 GERD (gastroesophageal reflux disease) 09/24/2014  . COPD (chronic obstructive pulmonary disease) (HCC) 09/24/2014  . Borderline personality disorder (HCC) 09/24/2014  . Cerebral palsy (HCC) 08/31/2014    Past Surgical History:  Procedure Laterality Date  . CHOLECYSTECTOMY    . INCISION AND DRAINAGE ABSCESS Right 01/02/2015   Procedure: INCISION AND DRAINAGE ABSCESS;   Surgeon: Tiney Rouge III, MD;  Location: [facility] ORS;  Service: General;  Laterality: Right;  . TONSILLECTOMY      Prior to Admission medications   Medication Sig Start Date End Date Taking? Authorizing Provider  ARIPiprazole (ABILIFY) 20 MG tablet Take 1 tablet (20 mg total) by mouth daily. 08/10/18   Charm Rings, NP  benztropine (COGENTIN) 2 MG tablet Take 1 tablet (2 mg total) by mouth 2 (two) times daily. 08/09/18   Charm Rings, NP  clonazePAM (KLONOPIN) 1 MG tablet Take 1 tablet (1 mg total) by mouth 2 (two) times daily. 08/09/18   Charm Rings, NP  divalproex (DEPAKOTE ER) 500 MG 24 hr tablet Take 2 tablets (1,000 mg total) by mouth at bedtime. 08/09/18   Charm Rings, NP  famotidine (PEPCID) 20 MG tablet Take 1 tablet (20 mg total) by mouth 2 (two) times daily. 08/09/18   Charm Rings, NP  fenofibrate 160 MG tablet Take 1 tablet (160 mg total) by mouth daily. 08/09/18   Charm Rings, NP  fluvoxaMINE (LUVOX) 100 MG tablet Take 1 tablet (100 mg total) by mouth at bedtime. 08/09/18   Charm Rings, NP  levothyroxine (SYNTHROID) 50 MCG tablet Take 1 tablet (50 mcg total) by mouth daily before breakfast. 08/09/18   Charm Rings, NP  nitrofurantoin (MACRODANTIN) 100 MG capsule Take 1 capsule (100 mg total) by mouth 2 (two) times daily. 08/09/18   Charm Rings, NP  OLANZapine (ZYPREXA) 5 MG tablet Take 1 tablet (5 mg total)  by mouth every 8 (eight) hours as needed (agitation). 08/09/18   Charm Rings, NP  pantoprazole (PROTONIX) 40 MG tablet Take 1 tablet (40 mg total) by mouth daily. 08/09/18   Charm Rings, NP  sucralfate (CARAFATE) 1 g tablet Take 1 tablet (1 g total) by mouth 4 (four) times daily -  with meals and at bedtime. 08/09/18   Charm Rings, NP    Allergies  Allergen Reactions  . Penicillins Nausea And Vomiting and Other (See Comments)    Has patient had a PCN reaction causing immediate rash, facial/tongue/throat swelling, SOB or lightheadedness with  hypotension: No Has patient had a PCN reaction causing severe rash involving mucus membranes or skin necrosis: No Has patient had a PCN reaction that required hospitalization No Has patient had a PCN reaction occurring within the last 10 years: No If all of the above answers are "NO", then may proceed with Cephalosporin use      Family History  Problem Relation Age of Onset  . Heart attack Father   . Diabetes Mother     Social History Social History   Tobacco Use  . Smoking status: Former Games developer  . Smokeless tobacco: Never Used  Substance Use Topics  . Alcohol use: No  . Drug use: No    Review of Systems Constitutional: Negative for fever. Cardiovascular: Negative for chest pain. Respiratory: Negative for shortness of breath. Gastrointestinal: Negative for abdominal pain Musculoskeletal: Negative for musculoskeletal complaints Neurological: Negative for headache All other ROS negative  ____________________________________________   PHYSICAL EXAM:  VITAL SIGNS: ED Triage Vitals  Enc Vitals Group     BP 08/09/18 2030 117/66     Pulse Rate 08/09/18 2030 77     Resp 08/09/18 2030 18     Temp 08/09/18 2030 98.2 F (36.8 C)     Temp Source 08/09/18 2030 Oral     SpO2 08/09/18 2030 95 %     Weight --      Height --      Head Circumference --      Peak Flow --      Pain Score 08/09/18 2031 10     Pain Loc --      Pain Edu? --      Excl. in GC? --    Constitutional: Alert and oriented. Well appearing and in no distress. Eyes: Normal exam ENT      Head: Normocephalic and atraumatic.      Mouth/Throat: Mucous membranes are moist. Cardiovascular: Normal rate, regular rhythm.  Respiratory: Normal respiratory effort without tachypnea nor retractions. Breath sounds are clear  Gastrointestinal: Soft and nontender. No distention. Musculoskeletal: Nontender with normal range of motion in all extremities.  Neurologic:  Normal speech and language. No gross focal  neurologic deficits  Skin:  Skin is warm, dry and intact.  Psychiatric: States suicidal ideation because she does not have anywhere to go.   INITIAL IMPRESSION / ASSESSMENT AND PLAN / ED COURSE  Pertinent labs & imaging results that were available during my care of the patient were reviewed by me and considered in my medical decision making (see chart for details).   Patient presents to the emergency department.  States she has no group home to go back to because she was discharged from the group home.  Stated a hotel last night but did not have money for tonight so she came to the emergency department for being "suicidal."  Patient is very well-known to the emergency department  for similar visits multiple times per week stating suicidal thoughts because she did not want to go to her group home.  Now she has no group home to go to she is threatening suicidal thoughts because she does not want to go to a homeless shelter.  Patient does have cognitive impairment, which I believe contributes greatly to her overuse of the emergency department.  For the most part patient is usually pleasant however tonight patient is agitated when we tell her that she may likely have to go to a homeless shelter upon discharge and states "fuck you."  [name] was evaluated in Emergency Department on 08/09/2018 for the symptoms described in the history of present illness. She was evaluated in the context of the global COVID-19 pandemic, which necessitated consideration that the patient might be at risk for infection with the SARS-CoV-2 virus that causes COVID-19. Institutional protocols and algorithms that pertain to the evaluation of patients at risk for COVID-19 are in a state of rapid change based on information released by regulatory bodies including the CDC and federal and state organizations. These policies and algorithms were followed during the patient's care in the  ED.  ____________________________________________   FINAL CLINICAL IMPRESSION(S) / ED DIAGNOSES  Cognitive impairment   Minna AntisPaduchowski, Ashrith Sagan, MD 08/09/18 2135

## 2018-08-09 NOTE — ED Notes (Signed)
Pt discharged to [facility]- [phone](Community Support Team).  Pt refused discharge vital signs.  Pt denies SI, HI, and A/V hallucinations. Florencio and patient verbalized understanding of discharge instructions and follow up care.  Prescriptions were given to patient.

## 2018-08-09 NOTE — ED Notes (Signed)
At the end of triage patient states she is suicidal and homicidal.  Patient states she needs help finding a new group home.

## 2018-08-09 NOTE — BH Assessment (Signed)
This Clinical research associate spoke with [facility]- [phone] [facility] Support Team) who reports he will be here in 45 mins to p/u pt at discharge.  This information was relayed to pt's nurse and Celso Amy, NP.

## 2018-08-09 NOTE — ED Notes (Signed)
Called [name] [phone], [name] not available.  Coworker stated they have no other place for patient this weekend.  Pt. Was put up in a hotel after being discharged from hospital today.  Pt. Used hotel phone to call EMS multiple times today, hotel manager refunded patients money.

## 2018-08-09 NOTE — ED Triage Notes (Signed)
Patient reports back pain since fall in 2018.  Patient also reports that her group home has discharged her after a 30 day notice.

## 2018-08-10 DIAGNOSIS — R4189 Other symptoms and signs involving cognitive functions and awareness: Secondary | ICD-10-CM | POA: Diagnosis not present

## 2018-08-10 LAB — URINE DRUG SCREEN, QUALITATIVE (ARMC ONLY)
Amphetamines, Ur Screen: NOT DETECTED
Barbiturates, Ur Screen: NOT DETECTED
Benzodiazepine, Ur Scrn: POSITIVE — AB
Cannabinoid 50 Ng, Ur ~~LOC~~: NOT DETECTED
Cocaine Metabolite,Ur ~~LOC~~: NOT DETECTED
MDMA (Ecstasy)Ur Screen: POSITIVE — AB
Methadone Scn, Ur: NOT DETECTED
Opiate, Ur Screen: NOT DETECTED
Phencyclidine (PCP) Ur S: NOT DETECTED
Tricyclic, Ur Screen: NOT DETECTED

## 2018-08-10 MED ORDER — FENOFIBRATE 160 MG PO TABS
160.0000 mg | ORAL_TABLET | Freq: Every day | ORAL | Status: DC
  Administered 2018-08-12 – 2018-09-16 (×34): 160 mg via ORAL
  Filled 2018-08-10 (×42): qty 1

## 2018-08-10 MED ORDER — FLUVOXAMINE MALEATE 50 MG PO TABS
100.0000 mg | ORAL_TABLET | Freq: Every day | ORAL | Status: DC
  Administered 2018-08-10 – 2018-08-11 (×2): 100 mg via ORAL
  Filled 2018-08-10 (×3): qty 2

## 2018-08-10 MED ORDER — OLANZAPINE 5 MG PO TABS
5.0000 mg | ORAL_TABLET | Freq: Three times a day (TID) | ORAL | Status: DC | PRN
  Administered 2018-08-10 – 2018-08-15 (×2): 5 mg via ORAL
  Filled 2018-08-10 (×4): qty 1

## 2018-08-10 MED ORDER — BENZTROPINE MESYLATE 1 MG PO TABS
2.0000 mg | ORAL_TABLET | Freq: Two times a day (BID) | ORAL | Status: DC
  Administered 2018-08-10 – 2018-09-16 (×68): 2 mg via ORAL
  Filled 2018-08-10 (×70): qty 2

## 2018-08-10 MED ORDER — CLONAZEPAM 0.5 MG PO TABS
1.0000 mg | ORAL_TABLET | Freq: Two times a day (BID) | ORAL | Status: DC
  Administered 2018-08-10 – 2018-09-16 (×68): 1 mg via ORAL
  Filled 2018-08-10 (×2): qty 1
  Filled 2018-08-10 (×3): qty 2
  Filled 2018-08-10: qty 1
  Filled 2018-08-10 (×15): qty 2
  Filled 2018-08-10: qty 1
  Filled 2018-08-10: qty 2
  Filled 2018-08-10: qty 1
  Filled 2018-08-10: qty 2
  Filled 2018-08-10: qty 1
  Filled 2018-08-10 (×5): qty 2
  Filled 2018-08-10: qty 1
  Filled 2018-08-10 (×4): qty 2
  Filled 2018-08-10: qty 1
  Filled 2018-08-10 (×2): qty 2
  Filled 2018-08-10: qty 1
  Filled 2018-08-10 (×9): qty 2
  Filled 2018-08-10: qty 1
  Filled 2018-08-10 (×3): qty 2
  Filled 2018-08-10: qty 1
  Filled 2018-08-10 (×2): qty 2
  Filled 2018-08-10: qty 1
  Filled 2018-08-10 (×2): qty 2
  Filled 2018-08-10: qty 1
  Filled 2018-08-10 (×10): qty 2

## 2018-08-10 MED ORDER — DIVALPROEX SODIUM ER 250 MG PO TB24
1000.0000 mg | ORAL_TABLET | Freq: Every day | ORAL | Status: DC
  Administered 2018-08-10 – 2018-08-17 (×8): 1000 mg via ORAL
  Filled 2018-08-10: qty 4
  Filled 2018-08-10: qty 2
  Filled 2018-08-10 (×4): qty 4
  Filled 2018-08-10: qty 2
  Filled 2018-08-10: qty 4

## 2018-08-10 MED ORDER — NITROFURANTOIN MACROCRYSTAL 50 MG PO CAPS
100.0000 mg | ORAL_CAPSULE | Freq: Two times a day (BID) | ORAL | Status: DC
  Administered 2018-08-10 – 2018-08-25 (×27): 100 mg via ORAL
  Filled 2018-08-10 (×36): qty 2

## 2018-08-10 MED ORDER — FAMOTIDINE 20 MG PO TABS
20.0000 mg | ORAL_TABLET | Freq: Two times a day (BID) | ORAL | Status: DC
  Administered 2018-08-10 – 2018-09-16 (×68): 20 mg via ORAL
  Filled 2018-08-10 (×72): qty 1

## 2018-08-10 MED ORDER — ARIPIPRAZOLE 10 MG PO TABS
20.0000 mg | ORAL_TABLET | Freq: Every day | ORAL | Status: DC
  Administered 2018-08-12: 20 mg via ORAL
  Filled 2018-08-10 (×3): qty 2

## 2018-08-10 MED ORDER — LEVOTHYROXINE SODIUM 50 MCG PO TABS
50.0000 ug | ORAL_TABLET | Freq: Every day | ORAL | Status: DC
  Administered 2018-08-12 – 2018-09-16 (×31): 50 ug via ORAL
  Filled 2018-08-10 (×7): qty 1
  Filled 2018-08-10: qty 2
  Filled 2018-08-10: qty 1
  Filled 2018-08-10: qty 2
  Filled 2018-08-10 (×6): qty 1
  Filled 2018-08-10: qty 2
  Filled 2018-08-10 (×6): qty 1
  Filled 2018-08-10: qty 2
  Filled 2018-08-10 (×6): qty 1
  Filled 2018-08-10 (×2): qty 2

## 2018-08-10 MED ORDER — NYSTATIN 100000 UNIT/GM EX POWD
Freq: Two times a day (BID) | CUTANEOUS | Status: DC
  Administered 2018-08-10 – 2018-08-11 (×2): via TOPICAL
  Administered 2018-08-12: 1 g via TOPICAL
  Administered 2018-08-12 – 2018-08-13 (×2): via TOPICAL
  Administered 2018-08-14: 1 via TOPICAL
  Administered 2018-08-15 – 2018-09-15 (×25): via TOPICAL
  Filled 2018-08-10 (×4): qty 15

## 2018-08-10 MED ORDER — PANTOPRAZOLE SODIUM 40 MG PO TBEC
40.0000 mg | DELAYED_RELEASE_TABLET | Freq: Every day | ORAL | Status: DC
  Administered 2018-08-12 – 2018-09-16 (×34): 40 mg via ORAL
  Filled 2018-08-10 (×34): qty 1

## 2018-08-10 MED ORDER — SUCRALFATE 1 G PO TABS
1.0000 g | ORAL_TABLET | Freq: Three times a day (TID) | ORAL | Status: DC
  Administered 2018-08-10 – 2018-09-16 (×108): 1 g via ORAL
  Filled 2018-08-10 (×123): qty 1

## 2018-08-10 NOTE — ED Notes (Signed)
Patient has showered and dressed. Patient currently sitting in room watching TV.

## 2018-08-10 NOTE — ED Notes (Signed)
BEHAVIORAL HEALTH ROUNDING Patient sleeping: No. Patient alert and oriented: yes Behavior appropriate: Yes. - refusing medications and refusing to allow VS to be taken  ; If no, describe:  Nutrition and fluids offered: yes Toileting and hygiene offered: Yes  Sitter present: q15 minute observations and security monitoring Law enforcement present: Yes  ODS

## 2018-08-10 NOTE — ED Notes (Signed)
Patient is refusing shower, let Patient know that she can get new scrubs when she showers, but she states " No' Patient is at the door calling nurses names, she has body odor from poor hygiene, will keep encouraging her to shower. Patient is safe, q 15 minute checks.

## 2018-08-10 NOTE — ED Notes (Signed)
Patient is sitting in the dayroom, she is calm and cooperative, nurse will continue to monitor, q 15 minute checks, she is safe, no signs of distress.

## 2018-08-10 NOTE — ED Notes (Signed)
Food tray given to the patient.

## 2018-08-10 NOTE — ED Notes (Signed)
Patient moved to [facility] , room 3.

## 2018-08-10 NOTE — ED Notes (Signed)
ED  Is the patient under IVC or is there intent for IVC: no  voluntary Is the patient medically cleared: Yes.   Is there vacancy in the ED BHU: Yes.   Is the population mix appropriate for patient: Yes.    Reportedly pt came from the hotel with bed bugs Is the patient awaiting placement in inpatient or outpatient setting:   Has the patient had a psychiatric consult:  Consult pending  Survey of unit performed for contraband, proper placement and condition of furniture, tampering with fixtures in bathroom, shower, and each patient room: Yes.  ; Findings:  APPEARANCE/BEHAVIOR  cooperative NEURO ASSESSMENT Orientation: oriented x3  Denies pain Hallucinations: No.None noted (Hallucinations)  denies Speech: Normal Gait: normal RESPIRATORY ASSESSMENT Even  Unlabored respirations  CARDIOVASCULAR ASSESSMENT Pulses equal   regular rate  Skin warm and dry   GASTROINTESTINAL ASSESSMENT no GI complaint EXTREMITIES Full ROM  PLAN OF CARE Provide calm/safe environment. Vital signs assessed twice daily. ED BHU Assessment once each 12-hour shift. Collaborate with TTS daily or as condition indicates. Assure the ED provider has rounded once each shift. Provide and encourage hygiene. Provide redirection as needed. Assess for escalating behavior; address immediately and inform ED provider.  Assess family dynamic and appropriateness for visitation as needed: Yes.  ; If necessary, describe findings:  Educate the patient/family about BHU procedures/visitation: Yes.  ; If necessary, describe findings:

## 2018-08-10 NOTE — ED Notes (Signed)

## 2018-08-10 NOTE — Consult Note (Addendum)
[facility] Face-to-Face Psychiatry Consult   Reason for Consult:  S/I. Referring Physician:  Dr. Derrill Kay [name] MRN:  [id number] Principal Diagnosis: <principal problem not specified> Diagnosis:  Adjustment disorder with mixed disturbance of emotions and conduct Borderline personality disorder History of schizoaffective disorder  Homelessness   Total Time spent with patient: 1 hour  HPI:   [name] is a 53 year old Caucasian female with a history of schizoaffective disorder, intellectual disability and borderline personality disorder.  She was seen yesterday due to suicidal ideations, but once evaluated, she denied have them, and therefore she was discharged.  Later on yesterday, she came back expressing suicidal thoughts again.  When asked today, patient tells me "kinda."  Tells me that she is seeing vague pictures on the door."  She reports racing thoughts.  Tells me that she has not been sleeping.  Patient has been calm and cooperative in the emergency department.  Does me that she is about been off of her medications recently. Currently reports passive suicidal ideations. Reports poor sleep and appetite. Concentration is baseline. Denies recent mania.   Collateral obtained from [facility] 08/05/2018 CST from [facility] [phone]) who expresses concern that patient is unable to adequately care for herself and unreliable and managing her own medications and finances.  He states that there has been a petition for patient to be placed under guardianship of the state, however this has not been completed.  There is a group home that is interested in interviewing [place], and arrangements are being made if patient is willing to stay in hospital to allow assistance for housing options.  Past Psychiatric History:   History of schizoaffective disorder, borderline personality disorder, electro disability.  Unknown number of suicide  attempts.  Past Medical History:  Past Medical History:  Diagnosis Date  . Anxiety   . Asthma   . Borderline personality disorder (HCC)   . Cerebral palsy (HCC)   . COPD (chronic obstructive pulmonary disease) (HCC)   . Depression   . GERD (gastroesophageal reflux disease)   . Mild cognitive impairment   . Schizoaffective disorder (HCC)   . Wound abscess     Past Surgical History:  Procedure Laterality Date  . CHOLECYSTECTOMY    . INCISION AND DRAINAGE ABSCESS Right 01/02/2015   Procedure: INCISION AND DRAINAGE ABSCESS;  Surgeon: Tiney Rouge III, MD;  Location: [facility] ORS;  Service: General;  Laterality: Right;  . TONSILLECTOMY     Family History:  Family History  Problem Relation Age of Onset  . Heart attack Father   . Diabetes Mother    Family Psychiatric  History: none  Social History:  Patient is essentially homeless at this time, as she previously was residing in a group home which had provided her 30-day notice.  Her 30-day notice is completed.  Social History:  Social History   Substance and Sexual Activity  Alcohol Use No     Social History   Substance and Sexual Activity  Drug Use No    Social History   Socioeconomic History  . Marital status: Single    Spouse name: Not on file  . Number of children: Not on file  . Years of education: Not on file  . Highest education level: Not on file  Occupational History  . Occupation: Disabled  Social Needs  . Financial resource strain: Not on file  . Food insecurity:    Worry: Not on file    Inability: Not on file  . Transportation needs:  Medical: Not on file    Non-medical: Not on file  Tobacco Use  . Smoking status: Former Games developermoker  . Smokeless tobacco: Never Used  Substance and Sexual Activity  . Alcohol use: No  . Drug use: No  . Sexual activity: Not Currently  Lifestyle  . Physical activity:    Days per week: Not on file    Minutes per session: Not on file  . Stress: Not on file   Relationships  . Social connections:    Talks on phone: Not on file    Gets together: Not on file    Attends religious service: Not on file    Active member of club or organization: Not on file    Attends meetings of clubs or organizations: Not on file    Relationship status: Not on file  Other Topics Concern  . Not on file  Social History Narrative   Pt lives with mother, nephew, sister in law   Additional Social History:    Allergies:   Allergies  Allergen Reactions  . Penicillins Nausea And Vomiting and Other (See Comments)    Has patient had a PCN reaction causing immediate rash, facial/tongue/throat swelling, SOB or lightheadedness with hypotension: No Has patient had a PCN reaction causing severe rash involving mucus membranes or skin necrosis: No Has patient had a PCN reaction that required hospitalization No Has patient had a PCN reaction occurring within the last 10 years: No If all of the above answers are "NO", then may proceed with Cephalosporin use      Labs:  Results for orders placed or performed during the hospital encounter of 08/09/18 (from the past 48 hour(s))  Comprehensive metabolic panel     Status: Abnormal   Collection Time: 08/09/18  8:46 PM  Result Value Ref Range   Sodium 140 135 - 145 mmol/L   Potassium 3.9 3.5 - 5.1 mmol/L   Chloride 103 98 - 111 mmol/L   CO2 28 22 - 32 mmol/L   Glucose, Bld 91 70 - 99 mg/dL   BUN 27 (H) 6 - 20 mg/dL   Creatinine, Ser 1.610.92 0.44 - 1.00 mg/dL   Calcium 9.3 8.9 - 09.610.3 mg/dL   Total Protein 7.5 6.5 - 8.1 g/dL   Albumin 3.7 3.5 - 5.0 g/dL   AST 24 15 - 41 U/L   ALT 19 0 - 44 U/L   Alkaline Phosphatase 50 38 - 126 U/L   Total Bilirubin 0.6 0.3 - 1.2 mg/dL   GFR calc non Af Amer >60 >60 mL/min   GFR calc Af Amer >60 >60 mL/min   Anion gap 9 5 - 15    Comment: Performed at [facility], [address] [place], [place] [address]  Ethanol     Status: None   Collection Time: 08/09/18  8:46 PM   Result Value Ref Range   Alcohol, Ethyl (B) <10 <10 mg/dL    Comment: (NOTE) Lowest detectable limit for serum alcohol is 10 mg/dL. For medical purposes only. Performed at [facility], [address] [place], [place] [address]   Salicylate level     Status: None   Collection Time: 08/09/18  8:46 PM  Result Value Ref Range   Salicylate Lvl <7.0 2.8 - 30.0 mg/dL    Comment: Performed at [facility], [address] [place], [place] [address]  Acetaminophen level     Status: Abnormal   Collection Time: 08/09/18  8:46 PM  Result Value Ref  Range   Acetaminophen (Tylenol), Serum <10 (L) 10 - 30 ug/mL    Comment: (NOTE) Therapeutic concentrations vary significantly. A range of 10-30 ug/mL  may be an effective concentration for many patients. However, some  are best treated at concentrations outside of this range. Acetaminophen concentrations >150 ug/mL at 4 hours after ingestion  and >50 ug/mL at 12 hours after ingestion are often associated with  toxic reactions. Performed at [facility], [address] [place], [place] [address]   cbc     Status: Abnormal   Collection Time: 08/09/18  8:46 PM  Result Value Ref Range   WBC 8.8 4.0 - 10.5 K/uL   RBC 3.74 (L) 3.87 - 5.11 MIL/uL   Hemoglobin 11.9 (L) 12.0 - 15.0 g/dL   HCT 60.4 54.0 - 98.1 %   MCV 101.6 (H) 80.0 - 100.0 fL   MCH 31.8 26.0 - 34.0 pg   MCHC 31.3 30.0 - 36.0 g/dL   RDW 19.1 47.8 - 29.5 %   Platelets 345 150 - 400 K/uL   nRBC 0.0 0.0 - 0.2 %    Comment: Performed at [facility], [address], [place], [place] [address]  Urine Drug Screen, Qualitative     Status: Abnormal   Collection Time: 08/09/18  8:46 PM  Result Value Ref Range   Tricyclic, Ur Screen NONE DETECTED NONE DETECTED   Amphetamines, Ur Screen NONE DETECTED NONE DETECTED   MDMA (Ecstasy)Ur Screen POSITIVE (A) NONE DETECTED   Cocaine Metabolite,Ur [place] NONE DETECTED NONE DETECTED   Opiate, Ur Screen NONE  DETECTED NONE DETECTED   Phencyclidine (PCP) Ur S NONE DETECTED NONE DETECTED   Cannabinoid 50 Ng, Ur [place] NONE DETECTED NONE DETECTED   Barbiturates, Ur Screen NONE DETECTED NONE DETECTED   Benzodiazepine, Ur Scrn POSITIVE (A) NONE DETECTED   Methadone Scn, Ur NONE DETECTED NONE DETECTED    Comment: (NOTE) Tricyclics + metabolites, urine    Cutoff 1000 ng/mL Amphetamines + metabolites, urine  Cutoff 1000 ng/mL MDMA (Ecstasy), urine              Cutoff 500 ng/mL Cocaine Metabolite, urine          Cutoff 300 ng/mL Opiate + metabolites, urine        Cutoff 300 ng/mL Phencyclidine (PCP), urine         Cutoff 25 ng/mL Cannabinoid, urine                 Cutoff 50 ng/mL Barbiturates + metabolites, urine  Cutoff 200 ng/mL Benzodiazepine, urine              Cutoff 200 ng/mL Methadone, urine                   Cutoff 300 ng/mL The urine drug screen provides only a preliminary, unconfirmed analytical test result and should not be used for non-medical purposes. Clinical consideration and professional judgment should be applied to any positive drug screen result due to possible interfering substances. A more specific alternate chemical method must be used in order to obtain a confirmed analytical result. Gas chromatography / mass spectrometry (GC/MS) is the preferred confirmat ory method. Performed at [facility], [address], [place], [place] [address]     Current Facility-Administered Medications  Medication Dose Route Frequency Provider Last Rate Last Dose  . ARIPiprazole (ABILIFY) tablet 20 mg  20 mg Oral Daily Reggie Pile, MD      . benztropine (COGENTIN) tablet 2  mg  2 mg Oral BID Reggie Pile, MD      . clonazePAM Scarlette Calico) tablet 1 mg  1 mg Oral BID Reggie Pile, MD      . divalproex (DEPAKOTE ER) 24 hr tablet 1,000 mg  1,000 mg Oral QHS Reggie Pile, MD      . famotidine (PEPCID) tablet 20 mg  20 mg Oral BID Reggie Pile, MD      . fenofibrate tablet 160 mg  160 mg  Oral Daily Reggie Pile, MD      . fluvoxaMINE (LUVOX) tablet 100 mg  100 mg Oral QHS Reggie Pile, MD      . Melene Muller ON 08/11/2018] levothyroxine (SYNTHROID) tablet 50 mcg  50 mcg Oral QAC breakfast Reggie Pile, MD      . nitrofurantoin (MACRODANTIN) capsule 100 mg  100 mg Oral BID Reggie Pile, MD      . OLANZapine [facility]) tablet 5 mg  5 mg Oral Q8H PRN Reggie Pile, MD      . pantoprazole (PROTONIX) EC tablet 40 mg  40 mg Oral Daily Reggie Pile, MD      . sucralfate (CARAFATE) tablet 1 g  1 g Oral TID WC & HS Reggie Pile, MD       Current Outpatient Medications  Medication Sig Dispense Refill  . ARIPiprazole (ABILIFY) 20 MG tablet Take 1 tablet (20 mg total) by mouth daily. 30 tablet 0  . benztropine (COGENTIN) 2 MG tablet Take 1 tablet (2 mg total) by mouth 2 (two) times daily. 60 tablet 0  . clonazePAM (KLONOPIN) 1 MG tablet Take 1 tablet (1 mg total) by mouth 2 (two) times daily. 30 tablet 0  . divalproex (DEPAKOTE ER) 500 MG 24 hr tablet Take 2 tablets (1,000 mg total) by mouth at bedtime. 60 tablet 0  . famotidine (PEPCID) 20 MG tablet Take 1 tablet (20 mg total) by mouth 2 (two) times daily. 60 tablet 0  . fenofibrate 160 MG tablet Take 1 tablet (160 mg total) by mouth daily. 30 tablet 1  . fluvoxaMINE (LUVOX) 100 MG tablet Take 1 tablet (100 mg total) by mouth at bedtime. 30 tablet 0  . levothyroxine (SYNTHROID) 50 MCG tablet Take 1 tablet (50 mcg total) by mouth daily before breakfast. 30 tablet 1  . nitrofurantoin (MACRODANTIN) 100 MG capsule Take 1 capsule (100 mg total) by mouth 2 (two) times daily. 60 capsule 0  . OLANZapine (ZYPREXA) 5 MG tablet Take 1 tablet (5 mg total) by mouth every 8 (eight) hours as needed (agitation). 30 tablet 0  . pantoprazole (PROTONIX) 40 MG tablet Take 1 tablet (40 mg total) by mouth daily. 30 tablet 1  . sucralfate (CARAFATE) 1 g tablet Take 1 tablet (1 g total) by mouth 4 (four) times daily -  with meals and at bedtime. 120 tablet 1    Psychiatric Specialty Exam: Physical Exam  Nursing note and vitals reviewed. Constitutional: She is oriented to person, place, and time. She appears well-developed and well-nourished. No distress.  HENT:  Head: Normocephalic.  Eyes: EOM are normal.  Neck: Normal range of motion.  Cardiovascular: Normal rate.  Respiratory: Effort normal. No respiratory distress.  Musculoskeletal: Normal range of motion.  Neurological: She is alert and oriented to person, place, and time.  Psychiatric: Her speech is normal. Her affect is neutral   Review of Systems  Psychiatric/Behavioral: Negative for depression, hallucinations, substance abuse and suicidal ideas. The patient is not nervous/anxious.   All other systems reviewed and  are negative.     General Appearance: Casual  Eye Contact:  Good  Speech:  Slow  Volume:  Normal  Mood:  Depressed  Affect:  congruent  Thought Process:  Coherent and Descriptions of Associations: Intact  Orientation:  Full (Time, Place, and Person)  Thought Content:  Hallucinations: None and Rumination perseverative about moving towards [facility] to live near to family  Suicidal Thoughts:  Yes, passively  Homicidal Thoughts:  No  Memory:  Immediate;   Good Recent;   Good Remote;   Good  Judgement:  Other:  Limited  Insight:  Shallow  Psychomotor Activity:  Normal  Concentration:  Concentration: Good and Attention Span: Good  Recall:  [employer] of Knowledge:  Fair  Language:  Good  Akathisia:  No  Handed:  Right  AIMS (if indicated):   0  Assets:  Resilience Social Support  ADL's:  Intact  Cognition:  WNL  Sleep:   Adequate    Treatment Plan Summary: Medication management In speaking to TTS today, she is not able to go back to her group home. We will look for group home placement We will place a social work consult Patient is asking to leave the hospital.  We will put the placement on an involuntary commitment--expresses Passive s/i,  hallucinations, and has poor insight.  Restart home medications  Disposition: Supportive therapy provided about ongoing stressors.  Reggie Pile, MD 08/10/2018 11:05 AM

## 2018-08-10 NOTE — ED Notes (Signed)
BEHAVIORAL HEALTH ROUNDING Patient sleeping: No. Patient alert and oriented: yes Behavior appropriate: Yes.  ; If no, describe:  Nutrition and fluids offered: yes Toileting and hygiene offered: Yes  Sitter present: q15 minute observations and security  monitoring Law enforcement present: Yes  ODS  

## 2018-08-10 NOTE — ED Notes (Signed)
Flo from [facility] ( [facility]- services) called and said they are looking for another group home for Patient, and He would like to be contacted if any new updates,   the number is [phone].

## 2018-08-10 NOTE — ED Notes (Signed)
She refused to allow her VS to be taken  

## 2018-08-10 NOTE — ED Notes (Signed)
Pt observed sitting on the side of the bed - watching TV   Pt visualized with NAD  No verbalized needs or concerns at this time  Continue to monitor

## 2018-08-10 NOTE — ED Notes (Signed)
Patient is calm and cooperative, no signs of distress at this time, will continue to monitor.

## 2018-08-10 NOTE — ED Notes (Signed)
Patient ate 100% of supper and beverage.  

## 2018-08-10 NOTE — ED Notes (Signed)
Dr Felizardo Hoffmann is currently in her room consulting with her

## 2018-08-10 NOTE — ED Notes (Signed)
Patient took medications with no issues. Snack tray given with soda.

## 2018-08-10 NOTE — ED Notes (Signed)
Patient has had to be redirected multiple times by this Clinical research associate and also [employer] officers on to take a shower and wash with wash cloth. Patient cussing at this Clinical research associate calling this Clinical research associate a "Mother fucking Bitch, Fuck you, Go away you Bitch." Patient has finally showered and dressed. Patient has red areas under left and right breast. Order for Nystain powder placed.

## 2018-08-10 NOTE — ED Notes (Signed)
Patient is stating that she is having Si/hi and hearing voices and she is afraid, nervous and needs a chest x-ray, no cough noted, v/s stable, will continue to monitor, q 15 minute checks and camera surveillance for safety.

## 2018-08-10 NOTE — ED Notes (Signed)
Patient is talking on the phone to [facility] ( caseworker) , she is calm at this time.

## 2018-08-11 DIAGNOSIS — R4189 Other symptoms and signs involving cognitive functions and awareness: Secondary | ICD-10-CM | POA: Diagnosis not present

## 2018-08-11 NOTE — ED Notes (Signed)
Hourly rounding reveals patient in room. No complaints, stable, in no acute distress. Q15 minute rounds and monitoring via Security Cameras to continue. 

## 2018-08-11 NOTE — ED Notes (Signed)
This writer attempted to wake up patient to change scrubs due to patient having an incontinent episode in bed. Patient states, "I don't care leave leave me alone." This writer will try again.

## 2018-08-11 NOTE — ED Notes (Signed)
BEHAVIORAL HEALTH ROUNDING Patient sleeping: Yes.   Patient alert and oriented: eyes closed  Appears asleep Behavior appropriate: Yes.  ; If no, describe:  Nutrition and fluids offered: Yes  Toileting and hygiene offered: sleeping Sitter present: q 15 minute observations and security camera monitoring Law enforcement present: yes  ODS 

## 2018-08-11 NOTE — ED Notes (Signed)
Patient is currently back in bed. Patient before going to bed asked this writer for juice. This Clinical research associate explained she could have some water and have juice at breakfast in a couple of hours. Patient started cursing at [employer] stating, "you are being a bitch, you better get me some juice." This writer walked away from patient.

## 2018-08-11 NOTE — ED Notes (Signed)
This Clinical research associate has tried to wake up patient to change paper scrubs again and sheets from where patient has had incontince episode while sleeping. Patient refusing to get up. Patient continues to states, "Leave me alone." Advised patient that she is laying in urine and we need to get it cleaned up. Patient states, "I don't care, leave me alone."

## 2018-08-11 NOTE — ED Notes (Signed)
Hourly rounding reveals patient sleeping in room. No complaints, stable, in no acute distress. Q15 minute rounds and monitoring via Security Cameras to continue. 

## 2018-08-11 NOTE — ED Notes (Signed)
BEHAVIORAL HEALTH ROUNDING Patient sleeping: Yes.   Patient alert and oriented: eyes closed  Appears to be asleep Behavior appropriate: Yes.  ; If no, describe:  Nutrition and fluids offered: Yes  Toileting and hygiene offered: sleeping Sitter present: q 15 minute observations and security camera monitoring Law enforcement present: yes  ODS 

## 2018-08-11 NOTE — ED Notes (Signed)
Report to include Situation, Background, Assessment, and Recommendations received from [facility] RN. Patient alert and oriented, warm and dry, in no acute distress. Patient denies HI and pain. Patient c/o SI to run out in front of a car and is hearing voices telling her to do the same and sees ghosts. Patient made aware of Q15 minute rounds and security cameras for their safety. Patient instructed to come to me with needs or concerns.

## 2018-08-11 NOTE — ED Notes (Signed)
She has eaten her supper - NAD assessed  No verbalized needs or concerns at this time  Continue to monitor

## 2018-08-11 NOTE — Consult Note (Signed)
[facility] Face-to-Face Psychiatry Consult   Reason for Consult:  S/I. Referring Physician:  Dr. Derrill Kay [name] Identification: [name] MRN:  [id number] Principal Diagnosis: <principal problem not specified> Diagnosis:  Adjustment disorder with mixed disturbance of emotions and conduct Borderline personality disorder History of schizoaffective disorder  Homelessness   Total Time spent with [name]: 15 minutes  HPI:   [name] was described to be irritable yesterday, she was moved to the behavioral health part of the emergency department.  She refused her Abilify yesterday, but took her other medications.  She had difficulty sleeping last night and had a bout of urinary incontinence.  [name] was sleeping today.  Unable  to ascertain if she has suicidal ideations, depression or irritability.  Unable to ascertain if she is having racing thoughts or hallucinations.  Collateral obtained from [facility] 08/05/2018 CST from Psychotherapeutic Services [phone]) who expresses concern that [name] is unable to adequately care for herself and unreliable and managing her own medications and finances.  He states that there has been a petition for [name] to be placed under guardianship of the state, however this has not been completed.  There is a group home that is interested in interviewing [name], and arrangements are being made if [name] is willing to stay in hospital to allow assistance for housing options.  Past Psychiatric History:   History of schizoaffective disorder, borderline personality disorder, electro disability.  Unknown number of suicide attempts.  Past Medical History:  Past Medical History:  Diagnosis Date  . Anxiety   . Asthma   . Borderline personality disorder (HCC)   . Cerebral palsy (HCC)   . COPD (chronic obstructive pulmonary disease) (HCC)   . Depression   . GERD (gastroesophageal reflux disease)   . Mild cognitive impairment   . Schizoaffective  disorder (HCC)   . Wound abscess     Past Surgical History:  Procedure Laterality Date  . CHOLECYSTECTOMY    . INCISION AND DRAINAGE ABSCESS Right 01/02/2015   Procedure: INCISION AND DRAINAGE ABSCESS;  Surgeon: Tiney Rouge III, MD;  Location: [facility] ORS;  Service: General;  Laterality: Right;  . TONSILLECTOMY     Family History:  Family History  Problem Relation Age of Onset  . Heart attack Father   . Diabetes Mother    Family Psychiatric  History: none  Social History:  [name] is essentially homeless at this time, as she previously was residing in a group home which had provided her 30-day notice.  Her 30-day notice is completed.  Social History:  Social History   Substance and Sexual Activity  Alcohol Use No     Social History   Substance and Sexual Activity  Drug Use No    Social History   Socioeconomic History  . Marital status: Single    Spouse name: Not on file  . Number of children: Not on file  . Years of education: Not on file  . Highest education level: Not on file  Occupational History  . Occupation: Disabled  Social Needs  . Financial resource strain: Not on file  . Food insecurity:    Worry: Not on file    Inability: Not on file  . Transportation needs:    Medical: Not on file    Non-medical: Not on file  Tobacco Use  . Smoking status: Former Games developer  . Smokeless tobacco: Never Used  Substance and Sexual Activity  . Alcohol use: No  . Drug use: No  . Sexual activity: Not Currently  Lifestyle  .  Physical activity:    Days per week: Not on file    Minutes per session: Not on file  . Stress: Not on file  Relationships  . Social connections:    Talks on phone: Not on file    Gets together: Not on file    Attends religious service: Not on file    Active member of club or organization: Not on file    Attends meetings of clubs or organizations: Not on file    Relationship status: Not on file  Other Topics Concern  . Not on file  Social  History Narrative   Pt lives with mother, nephew, sister in law   Additional Social History:    Allergies:   Allergies  Allergen Reactions  . Penicillins Nausea And Vomiting and Other (See Comments)    Has [name] had a PCN reaction causing immediate rash, facial/tongue/throat swelling, SOB or lightheadedness with hypotension: No Has [name] had a PCN reaction causing severe rash involving mucus membranes or skin necrosis: No Has [name] had a PCN reaction that required hospitalization No Has [name] had a PCN reaction occurring within the last 10 years: No If all of the above answers are "NO", then may proceed with Cephalosporin use      Labs:  Results for orders placed or performed during the hospital encounter of 08/09/18 (from the past 48 hour(s))  Comprehensive metabolic panel     Status: Abnormal   Collection Time: 08/09/18  8:46 PM  Result Value Ref Range   Sodium 140 135 - 145 mmol/L   Potassium 3.9 3.5 - 5.1 mmol/L   Chloride 103 98 - 111 mmol/L   CO2 28 22 - 32 mmol/L   Glucose, Bld 91 70 - 99 mg/dL   BUN 27 (H) 6 - 20 mg/dL   Creatinine, Ser 5.62 0.44 - 1.00 mg/dL   Calcium 9.3 8.9 - 13.0 mg/dL   Total Protein 7.5 6.5 - 8.1 g/dL   Albumin 3.7 3.5 - 5.0 g/dL   AST 24 15 - 41 U/L   ALT 19 0 - 44 U/L   Alkaline Phosphatase 50 38 - 126 U/L   Total Bilirubin 0.6 0.3 - 1.2 mg/dL   GFR calc non Af Amer >60 >60 mL/min   GFR calc Af Amer >60 >60 mL/min   Anion gap 9 5 - 15    Comment: Performed at [facility], [address] [place], [place] [address]  Ethanol     Status: None   Collection Time: 08/09/18  8:46 PM  Result Value Ref Range   Alcohol, Ethyl (B) <10 <10 mg/dL    Comment: (NOTE) Lowest detectable limit for serum alcohol is 10 mg/dL. For medical purposes only. Performed at [facility], [address] [place], [place] [address]   Salicylate level     Status: None   Collection Time: 08/09/18  8:46 PM  Result Value Ref Range    Salicylate Lvl <7.0 2.8 - 30.0 mg/dL    Comment: Performed at [facility], [address] [place], [place] [address]  Acetaminophen level     Status: Abnormal   Collection Time: 08/09/18  8:46 PM  Result Value Ref Range   Acetaminophen (Tylenol), Serum <10 (L) 10 - 30 ug/mL    Comment: (NOTE) Therapeutic concentrations vary significantly. A range of 10-30 ug/mL  may be an effective concentration for many patients. However, some  are best treated at concentrations outside of this range. Acetaminophen concentrations >150 ug/mL  at 4 hours after ingestion  and >50 ug/mL at 12 hours after ingestion are often associated with  toxic reactions. Performed at [facility], [address] [place], [place] [address]   cbc     Status: Abnormal   Collection Time: 08/09/18  8:46 PM  Result Value Ref Range   WBC 8.8 4.0 - 10.5 K/uL   RBC 3.74 (L) 3.87 - 5.11 MIL/uL   Hemoglobin 11.9 (L) 12.0 - 15.0 g/dL   HCT 60.438.0 54.036.0 - 98.146.0 %   MCV 101.6 (H) 80.0 - 100.0 fL   MCH 31.8 26.0 - 34.0 pg   MCHC 31.3 30.0 - 36.0 g/dL   RDW 19.114.2 47.811.5 - 29.515.5 %   Platelets 345 150 - 400 K/uL   nRBC 0.0 0.0 - 0.2 %    Comment: Performed at [facility], [address] [place], [place] [address]  Urine Drug Screen, Qualitative     Status: Abnormal   Collection Time: 08/09/18  8:46 PM  Result Value Ref Range   Tricyclic, Ur Screen NONE DETECTED NONE DETECTED   Amphetamines, Ur Screen NONE DETECTED NONE DETECTED   MDMA (Ecstasy)Ur Screen POSITIVE (A) NONE DETECTED   Cocaine Metabolite,Ur [place] NONE DETECTED NONE DETECTED   Opiate, Ur Screen NONE DETECTED NONE DETECTED   Phencyclidine (PCP) Ur S NONE DETECTED NONE DETECTED   Cannabinoid 50 Ng, Ur [place] NONE DETECTED NONE DETECTED   Barbiturates, Ur Screen NONE DETECTED NONE DETECTED   Benzodiazepine, Ur Scrn POSITIVE (A) NONE DETECTED   Methadone Scn, Ur NONE DETECTED NONE DETECTED    Comment: (NOTE) Tricyclics + metabolites, urine     Cutoff 1000 ng/mL Amphetamines + metabolites, urine  Cutoff 1000 ng/mL MDMA (Ecstasy), urine              Cutoff 500 ng/mL Cocaine Metabolite, urine          Cutoff 300 ng/mL Opiate + metabolites, urine        Cutoff 300 ng/mL Phencyclidine (PCP), urine         Cutoff 25 ng/mL Cannabinoid, urine                 Cutoff 50 ng/mL Barbiturates + metabolites, urine  Cutoff 200 ng/mL Benzodiazepine, urine              Cutoff 200 ng/mL Methadone, urine                   Cutoff 300 ng/mL The urine drug screen provides only a preliminary, unconfirmed analytical test result and should not be used for non-medical purposes. Clinical consideration and professional judgment should be applied to any positive drug screen result due to possible interfering substances. A more specific alternate chemical method must be used in order to obtain a confirmed analytical result. Gas chromatography / mass spectrometry (GC/MS) is the preferred confirmat ory method. Performed at [facility], [address] [place], [place] [address]     Current Facility-Administered Medications  Medication Dose Route Frequency Provider Last Rate Last Dose  . ARIPiprazole (ABILIFY) tablet 20 mg  20 mg Oral Daily Reggie PileJoshi, Sherita Decoste, MD      . benztropine (COGENTIN) tablet 2 mg  2 mg Oral BID Reggie PileJoshi, Tomi Grandpre, MD   2 mg at 08/10/18 2103  . clonazePAM (KLONOPIN) tablet 1 mg  1 mg Oral BID Reggie PileJoshi, Nigel Wessman, MD   1 mg at 08/10/18 2103  . divalproex (DEPAKOTE ER) 24 hr tablet 1,000 mg  1,000 mg  Oral QHS Reggie Pile, MD   1,000 mg at 08/10/18 2103  . famotidine (PEPCID) tablet 20 mg  20 mg Oral BID Reggie Pile, MD   20 mg at 08/10/18 2103  . fenofibrate tablet 160 mg  160 mg Oral Daily Reggie Pile, MD      . fluvoxaMINE (LUVOX) tablet 100 mg  100 mg Oral QHS Reggie Pile, MD   100 mg at 08/10/18 2103  . levothyroxine (SYNTHROID) tablet 50 mcg  50 mcg Oral Q0600 Reggie Pile, MD      . nitrofurantoin (MACRODANTIN) capsule 100 mg  100  mg Oral BID Reggie Pile, MD   100 mg at 08/10/18 2103  . nystatin (MYCOSTATIN/NYSTOP) topical powder   Topical BID Emily Filbert, MD      . OLANZapine [facility]) tablet 5 mg  5 mg Oral Q8H PRN Reggie Pile, MD   5 mg at 08/10/18 1644  . pantoprazole (PROTONIX) EC tablet 40 mg  40 mg Oral Daily Reggie Pile, MD      . sucralfate (CARAFATE) tablet 1 g  1 g Oral TID WC & HS Reggie Pile, MD   1 g at 08/10/18 2103   Current Outpatient Medications  Medication Sig Dispense Refill  . ARIPiprazole (ABILIFY) 20 MG tablet Take 1 tablet (20 mg total) by mouth daily. 30 tablet 0  . benztropine (COGENTIN) 2 MG tablet Take 1 tablet (2 mg total) by mouth 2 (two) times daily. 60 tablet 0  . clonazePAM (KLONOPIN) 1 MG tablet Take 1 tablet (1 mg total) by mouth 2 (two) times daily. 30 tablet 0  . divalproex (DEPAKOTE ER) 500 MG 24 hr tablet Take 2 tablets (1,000 mg total) by mouth at bedtime. 60 tablet 0  . famotidine (PEPCID) 20 MG tablet Take 1 tablet (20 mg total) by mouth 2 (two) times daily. 60 tablet 0  . fenofibrate 160 MG tablet Take 1 tablet (160 mg total) by mouth daily. 30 tablet 1  . fluvoxaMINE (LUVOX) 100 MG tablet Take 1 tablet (100 mg total) by mouth at bedtime. 30 tablet 0  . levothyroxine (SYNTHROID) 50 MCG tablet Take 1 tablet (50 mcg total) by mouth daily before breakfast. 30 tablet 1  . nitrofurantoin (MACRODANTIN) 100 MG capsule Take 1 capsule (100 mg total) by mouth 2 (two) times daily. 60 capsule 0  . OLANZapine (ZYPREXA) 5 MG tablet Take 1 tablet (5 mg total) by mouth every 8 (eight) hours as needed (agitation). 30 tablet 0  . pantoprazole (PROTONIX) 40 MG tablet Take 1 tablet (40 mg total) by mouth daily. 30 tablet 1  . sucralfate (CARAFATE) 1 g tablet Take 1 tablet (1 g total) by mouth 4 (four) times daily -  with meals and at bedtime. 120 tablet 1   Psychiatric Specialty Exam: Physical Exam  Nursing note and vitals reviewed. Constitutional: She is oriented to person, place,  and time. She appears well-developed and well-nourished. No distress.  HENT:  Head: Normocephalic.  Eyes: EOM are normal.  Neck: Normal range of motion.  Cardiovascular: Normal rate.  Respiratory: Effort normal. No respiratory distress.  Musculoskeletal: Normal range of motion.  Neurological: She is alert and oriented to person, place, and time.  Psychiatric: Her speech is normal. Her affect is neutral   Review of Systems  Psychiatric/Behavioral: Negative for depression, hallucinations, substance abuse and suicidal ideas. The [name] is not nervous/anxious.   All other systems reviewed and are negative.     General Appearance: Casual  Eye Contact:  Good  Speech:  Slow  Volume:  Normal  Mood:  Unknown  Affect:  congruent  Thought Process:  Coherent and Descriptions of Associations: Intact  Orientation:  Full (Time, Place, and Person)  Thought Content:  Unable to ascertain at this time.   Suicidal Thoughts:  Unable to ascertain  Homicidal Thoughts:  No  Memory:  Immediate;   Good Recent;   Good Remote;   Good  Judgement:  Other:  Limited  Insight:  Shallow  Psychomotor Activity:  Normal  Concentration:  Concentration: Good and Attention Span: Good  Recall:  [employer] of Knowledge:  Fair  Language:  Good  Akathisia:  No  Handed:  Right  AIMS (if indicated):   0  Assets:  Resilience Social Support  ADL's:  Intact  Cognition:  WNL  Sleep:   Adequate    Treatment Plan Summary: Medication management In speaking to TTS today, she is not able to go back to her group home. We will look for group home placement We will place a social work consult [name] is asking to leave the hospital.  We will put the placement on an involuntary commitment--expresses Passive s/i, hallucinations, and has poor insight.  Restart home medications  4/269 Obtain depakote level CST to assist in placement Pending placement.   Disposition: Supportive therapy provided about ongoing  stressors.  Reggie Pile, MD 08/11/2018 10:10 AM

## 2018-08-11 NOTE — ED Notes (Signed)
Patient observed lying in bed with eyes closed  Even, unlabored respirations observed   NAD pt appears to be sleeping  I will continue to monitor along with every 15 minute visual observations and ongoing security camera monitoring    

## 2018-08-11 NOTE — ED Notes (Signed)
BEHAVIORAL HEALTH ROUNDING Patient sleeping: Yes.   Patient alert and oriented: eyes closed  Appears to be asleep Behavior appropriate: Yes.  ; If no, describe:  Nutrition and fluids offered: Yes  Toileting and hygiene offered: sleeping Sitter present: q 15 minute observations and security camera monitoring Law enforcement present: yes  ODS  ENVIRONMENTAL ASSESSMENT Potentially harmful objects out of patient reach: Yes.   Personal belongings secured: Yes.   Patient dressed in hospital provided attire only: Yes.   Plastic bags out of patient reach: Yes.   Patient care equipment (cords, cables, call bells, lines, and drains) shortened, removed, or accounted for: Yes.   Equipment and supplies removed from bottom of stretcher: Yes.   Potentially toxic materials out of patient reach: Yes.   Sharps container removed or out of patient reach: Yes.

## 2018-08-11 NOTE — ED Notes (Addendum)
Supper tray provided   - pt sitting up in bed  - she has been sleeping this shift  - VSS  Assessment completed  Pt has urinated in the bed while sleeping - bed cleansed and linens changed  - continuous encouragement to take a shower - - pt refusing stating  "fuck you bitch - go to hell bitch - I don't like you - fuck you bitch"  Pt continues to be encouraged to take a bath by myself and EDT Kadijah    Pt stepped into shower while the two of [place] bathed her body and washed her hair  - pt was covered in urine  She dried herself and has been slowly putting clean clothing back on  Continue to monitor

## 2018-08-11 NOTE — ED Notes (Addendum)
Pt yelling  - "fuck you bitch - mind your own damn business - I am not taking any of my medicine for you - Fuck you bitch"   Reassurance provided  - encouraged her to eat her supper    "I am not doing anything you say"  continue to monitor

## 2018-08-11 NOTE — Social Work (Signed)
CSW received social work consult for homeless patient.  The patient is a 53 year old Caucasian female who presented to the [facility] with a history of behavioral health issues, suicidal ideation and multiple psychiatric hospitalizations. Previous MH diagnosis: cognitive impairment, anxiety, borderline personality disorder (HCC), depression, and schizoaffective. Patient previous/last residence was a group home. Group home gave patient 30-day notice and now she is homeless. She has had 37 ED admissions in the past 6 months. She has had extensive contact with [facility]; TOC CM/SW and psychiatric consults.  It is clear to this writer that this is a difficult to place patient and needs a higher level of care.  The patient has a Administrator, arts (CST) with Psychotherapeutic services.  Her worker is Mr. Florencio, [phone] or [phone].  Petition for [employer] guardianship of this patient is in progress. Patient has failed level 2 and 3 services.  Patient is also [facility] to [facility]. Care Coordinator, Larena Sox [phone])  Patient has a payee: [place].  Family members: Myleena Farleigh, brother, [phone] Plan: Clinician made contact the patent's CST worker Mr. Florencio who noted that "we are working on getting her a placement.  Right now, we have nowhere for her to go."  As of 08/11/2018 pt is placed on an involuntary commitment-expresses Passive S/I hallucination/ per psych consult 06/12/2018  Larwance Rote, MSW, LCSW  [phone] 8am-6pm (weekends) or CSW ED # [phone]

## 2018-08-11 NOTE — ED Notes (Signed)
Was able to finally wake patient up fully. This Clinical research associate explained to patient she was wet from urine and she can not just lay in urine. Placed clean paper scrubs in bathroom for patient to change into also bath wipe to wipe body down. Patient currently is in bathroom wiping self with bath wipes and dressing self. Clean mattress placed on bed to replace soiled mattress. Soiled mattress cleaned with purple top wipes and with disinfectant and air drying. Will continue to monitor.

## 2018-08-11 NOTE — ED Notes (Signed)
BEHAVIORAL HEALTH ROUNDING Patient sleeping: No. Patient alert and oriented: yes Behavior appropriate:.  ; If no, describe:  Nutrition and fluids offered: yes Toileting and hygiene offered: Yes  Sitter present: q15 minute observations and security camera monitoring Law enforcement present: Yes  ODS

## 2018-08-11 NOTE — ED Notes (Signed)
ED  Is the patient under IVC or is there intent for IVC: Yes.   Is the patient medically cleared: Yes.   Is there vacancy in the ED BHU: Yes.   Is the population mix appropriate for patient: Yes.   Is the patient awaiting placement in inpatient or outpatient setting:  Awaiting social work consult for placement  Has the patient had a psychiatric consult: Yes.   Survey of unit performed for contraband, proper placement and condition of furniture, tampering with fixtures in bathroom, shower, and each patient room: Yes.  ; Findings:  APPEARANCE/BEHAVIOR Angry and uncooperative NEURO ASSESSMENT Orientation: oriented x3  Denies pain Hallucinations: No.None noted (Hallucinations)  Denies  Speech: Normal Gait: normal RESPIRATORY ASSESSMENT Even  Unlabored respirations  CARDIOVASCULAR ASSESSMENT Pulses equal   regular rate  Skin warm and dry   GASTROINTESTINAL ASSESSMENT no GI complaint EXTREMITIES Full ROM  PLAN OF CARE Provide calm/safe environment. Vital signs assessed twice daily. ED BHU Assessment once each 12-hour shift. Collaborate with TTS daily or as condition indicates. Assure the ED provider has rounded once each shift. Provide and encourage hygiene. Provide redirection as needed. Assess for escalating behavior; address immediately and inform ED provider.  Assess family dynamic and appropriateness for visitation as needed: Yes.  ; If necessary, describe findings:  Educate the patient/family about BHU procedures/visitation: Yes.  ; If necessary, describe findings:

## 2018-08-12 DIAGNOSIS — F4325 Adjustment disorder with mixed disturbance of emotions and conduct: Secondary | ICD-10-CM

## 2018-08-12 DIAGNOSIS — G809 Cerebral palsy, unspecified: Secondary | ICD-10-CM | POA: Diagnosis not present

## 2018-08-12 DIAGNOSIS — F70 Mild intellectual disabilities: Secondary | ICD-10-CM

## 2018-08-12 DIAGNOSIS — F250 Schizoaffective disorder, bipolar type: Secondary | ICD-10-CM

## 2018-08-12 DIAGNOSIS — R4189 Other symptoms and signs involving cognitive functions and awareness: Secondary | ICD-10-CM | POA: Diagnosis not present

## 2018-08-12 DIAGNOSIS — R45851 Suicidal ideations: Secondary | ICD-10-CM

## 2018-08-12 MED ORDER — OLANZAPINE 5 MG PO TABS
5.0000 mg | ORAL_TABLET | Freq: Two times a day (BID) | ORAL | Status: DC
  Administered 2018-08-12 – 2018-08-13 (×3): 5 mg via ORAL
  Filled 2018-08-12 (×2): qty 1

## 2018-08-12 MED ORDER — FLUVOXAMINE MALEATE 50 MG PO TABS
150.0000 mg | ORAL_TABLET | Freq: Every day | ORAL | Status: DC
  Administered 2018-08-12 – 2018-09-15 (×31): 150 mg via ORAL
  Filled 2018-08-12 (×39): qty 3

## 2018-08-12 MED ORDER — LORAZEPAM 1 MG PO TABS
1.0000 mg | ORAL_TABLET | Freq: Once | ORAL | Status: AC
  Administered 2018-08-12: 1 mg via ORAL
  Filled 2018-08-12: qty 1

## 2018-08-12 NOTE — ED Notes (Signed)
Pt given 1 mg PO Ativan for agitation per Dr. Viviano Simas.

## 2018-08-12 NOTE — ED Notes (Signed)
Hourly rounding reveals patient sleeping in room. No complaints, stable, in no acute distress. Q15 minute rounds and monitoring via Security Cameras to continue. 

## 2018-08-12 NOTE — ED Notes (Signed)
Pt given lunch tray.

## 2018-08-12 NOTE — ED Notes (Signed)
Hourly rounding reveals patient in room. No complaints, stable, in no acute distress. Q15 minute rounds and monitoring via Rover and Officer to continue.   

## 2018-08-12 NOTE — ED Notes (Signed)
Hourly rounding reveals patient sleeping in room. No complaints, stable, in no acute distress. Q15 minute rounds and monitoring via Rover and Officer to continue.  

## 2018-08-12 NOTE — ED Notes (Signed)
Pt allowed to make a phone call with RN present. Maintained on 15 minute checks and observation by security camera for safety.

## 2018-08-12 NOTE — ED Provider Notes (Signed)
-----------------------------------------   6:48 AM on 08/12/2018 -----------------------------------------   Blood pressure 121/80, pulse 83, temperature 98.5 F (36.9 C), temperature source Oral, resp. rate 18, last menstrual period 05/28/2018, SpO2 99 %.  The patient is calm and cooperative at this time.  There have been no acute events since the last update.  Awaiting disposition plan from Behavioral Medicine and/or social work teams.   Loleta Rose, MD 08/12/18 [phone]

## 2018-08-12 NOTE — ED Notes (Signed)
IVC/  PENDING  PLACEMENT 

## 2018-08-12 NOTE — ED Notes (Signed)
Pt standing in hallway cussing. "This is bullshit."   Maintained on 15 minute checks and observation by security for safety.

## 2018-08-12 NOTE — Progress Notes (Signed)
Psychiatrist, Dr. Viviano Simas, requested that CSW follow-up with facilities regarding group home placement for this pt.   9:56am - CSW contacted Oneal Grout [phone]) and she stated that she still does not have a vacancy at her group home.   10:00am - CSW contacted [facility] to verify that there was still a ticket entered for pt. [name] at [facility] services stated that there is a ticket, and that she will put in an alert for someone to follow-up with CSW.    [name]  [facility] ED  [phone]

## 2018-08-12 NOTE — ED Notes (Signed)
Report to include Situation, Background, Assessment, and Recommendations received from Amy B. RN. Patient alert and oriented, warm and dry, in no acute distress. Patient denies VH and pain but states she has SI without plan and HI towards [facility] staff as well as hearing voices telling her to kill herself. Patient made aware of Q15 minute rounds and Psychologist, counselling presence for their safety. Patient instructed to come to me with needs or concerns.

## 2018-08-12 NOTE — ED Notes (Signed)
Pt washing up in bathroom. Maintained on 15 minute checks and observation by security camera for safety.

## 2018-08-12 NOTE — Progress Notes (Signed)
[facility] MD Progress Note  08/12/2018 10:05 AM [name]  MRN:  [id number] Subjective:  Suicidal ideation and unable to care for self Principal Problem: Adjustment disorder with mixed disturbance of emotions and conduct Diagnosis: Principal Problem:   Adjustment disorder with mixed disturbance of emotions and conduct Active Problems:   Cerebral palsy (HCC)   Mild intellectual disability   GERD (gastroesophageal reflux disease)   COPD (chronic obstructive pulmonary disease) (HCC)   Schizoaffective disorder, bipolar type (HCC)   Suicidal ideation  Patient is seen, chart is reviewed Total Time spent with patient: 25 min   HPI:   [name] is a 53 year old Caucasian female with a history of schizoaffective disorder, intellectual disability and borderline personality disorder.  She was seen in emergency department due to suicidal ideations, but once evaluated, she denied have them, and therefore she was discharged. Later on the same day, she came back expressing suicidal thoughts again, and racing thoughts.  Reports not been sleeping. Reports poor sleep and appetite. Concentration is baseline. Denies recent mania.   Past Psychiatric History: Schizoaffective disorder, mild intellectual disability, multiple suicide attempts  On evaluation, patient is agitated about being in the emergency department.  She is verbally assaultive with curse words, and stands with aggressive posturing making threatening statements, "you cannot stop me from leaving B----", while moving towards door.  Patient endorses that she went to a hotel, and wants to go back.  However, she was not allowed to stay at hotel as she was making continuous calls to 911.  Patient minimizes that she is unable to care for self, and reports she needs to call 911 in order to get help.  Patient continues to have passive suicidal ideation, but does not express a specific plan at this time.  She is denying homicidal ideation.  She is denying  auditory and visual hallucinations.  Contacted care coordinator who is assisting in arrangements for patient to be interviewed by a new group home.  Past Medical History:  Past Medical History:  Diagnosis Date  . Anxiety   . Asthma   . Borderline personality disorder (HCC)   . Cerebral palsy (HCC)   . COPD (chronic obstructive pulmonary disease) (HCC)   . Depression   . GERD (gastroesophageal reflux disease)   . Mild cognitive impairment   . Schizoaffective disorder (HCC)   . Wound abscess     Past Surgical History:  Procedure Laterality Date  . CHOLECYSTECTOMY    . INCISION AND DRAINAGE ABSCESS Right 01/02/2015   Procedure: INCISION AND DRAINAGE ABSCESS;  Surgeon: Tiney Rouge III, MD;  Location: [facility] ORS;  Service: General;  Laterality: Right;  . TONSILLECTOMY     Family History:  Family History  Problem Relation Age of Onset  . Heart attack Father   . Diabetes Mother    Family Psychiatric  History: None known  Social History:  Social History   Substance and Sexual Activity  Alcohol Use No     Social History   Substance and Sexual Activity  Drug Use No    Social History   Socioeconomic History  . Marital status: Single    Spouse name: Not on file  . Number of children: Not on file  . Years of education: Not on file  . Highest education level: Not on file  Occupational History  . Occupation: Disabled  Social Needs  . Financial resource strain: Not on file  . Food insecurity:    Worry: Not on file    Inability: Not  on file  . Transportation needs:    Medical: Not on file    Non-medical: Not on file  Tobacco Use  . Smoking status: Former Games developer  . Smokeless tobacco: Never Used  Substance and Sexual Activity  . Alcohol use: No  . Drug use: No  . Sexual activity: Not Currently  Lifestyle  . Physical activity:    Days per week: Not on file    Minutes per session: Not on file  . Stress: Not on file  Relationships  . Social connections:    Talks on  phone: Not on file    Gets together: Not on file    Attends religious service: Not on file    Active member of club or organization: Not on file    Attends meetings of clubs or organizations: Not on file    Relationship status: Not on file  Other Topics Concern  . Not on file  Social History Narrative   Pt lives with mother, nephew, sister in law   Additional Social History:        Patient is essentially homeless at this time, as she previously was residing in a group home which had provided her 30-day notice. Her 30-day notice is completed.                  Sleep: Good  Appetite:  Good  Current Medications: Current Facility-Administered Medications  Medication Dose Route Frequency Provider Last Rate Last Dose  . ARIPiprazole (ABILIFY) tablet 20 mg  20 mg Oral Daily Reggie Pile, MD   20 mg at 08/12/18 0920  . benztropine (COGENTIN) tablet 2 mg  2 mg Oral BID Reggie Pile, MD   2 mg at 08/12/18 9794  . clonazePAM (KLONOPIN) tablet 1 mg  1 mg Oral BID Reggie Pile, MD   1 mg at 08/12/18 0920  . divalproex (DEPAKOTE ER) 24 hr tablet 1,000 mg  1,000 mg Oral QHS Reggie Pile, MD   1,000 mg at 08/11/18 2116  . famotidine (PEPCID) tablet 20 mg  20 mg Oral BID Reggie Pile, MD   20 mg at 08/12/18 8016  . fenofibrate tablet 160 mg  160 mg Oral Daily Reggie Pile, MD   160 mg at 08/12/18 0920  . fluvoxaMINE (LUVOX) tablet 100 mg  100 mg Oral QHS Reggie Pile, MD   100 mg at 08/11/18 2116  . levothyroxine (SYNTHROID) tablet 50 mcg  50 mcg Oral Q0600 Reggie Pile, MD   50 mcg at 08/12/18 0528  . nitrofurantoin (MACRODANTIN) capsule 100 mg  100 mg Oral BID Reggie Pile, MD   100 mg at 08/12/18 0919  . nystatin (MYCOSTATIN/NYSTOP) topical powder   Topical BID Emily Filbert, MD   1 g at 08/12/18 5537  . OLANZapine (ZYPREXA) tablet 5 mg  5 mg Oral Q8H PRN Reggie Pile, MD   5 mg at 08/10/18 1644  . pantoprazole (PROTONIX) EC tablet 40 mg  40 mg Oral Daily Reggie Pile, MD   40 mg  at 08/12/18 4827  . sucralfate (CARAFATE) tablet 1 g  1 g Oral TID WC & HS Reggie Pile, MD   1 g at 08/12/18 0786   Current Outpatient Medications  Medication Sig Dispense Refill  . ARIPiprazole (ABILIFY) 20 MG tablet Take 1 tablet (20 mg total) by mouth daily. 30 tablet 0  . benztropine (COGENTIN) 2 MG tablet Take 1 tablet (2 mg total) by mouth 2 (two) times daily. 60 tablet 0  . clonazePAM (KLONOPIN)  1 MG tablet Take 1 tablet (1 mg total) by mouth 2 (two) times daily. 30 tablet 0  . divalproex (DEPAKOTE ER) 500 MG 24 hr tablet Take 2 tablets (1,000 mg total) by mouth at bedtime. 60 tablet 0  . famotidine (PEPCID) 20 MG tablet Take 1 tablet (20 mg total) by mouth 2 (two) times daily. 60 tablet 0  . fenofibrate 160 MG tablet Take 1 tablet (160 mg total) by mouth daily. 30 tablet 1  . fluvoxaMINE (LUVOX) 100 MG tablet Take 1 tablet (100 mg total) by mouth at bedtime. 30 tablet 0  . levothyroxine (SYNTHROID) 50 MCG tablet Take 1 tablet (50 mcg total) by mouth daily before breakfast. 30 tablet 1  . nitrofurantoin (MACRODANTIN) 100 MG capsule Take 1 capsule (100 mg total) by mouth 2 (two) times daily. 60 capsule 0  . OLANZapine (ZYPREXA) 5 MG tablet Take 1 tablet (5 mg total) by mouth every 8 (eight) hours as needed (agitation). 30 tablet 0  . pantoprazole (PROTONIX) 40 MG tablet Take 1 tablet (40 mg total) by mouth daily. 30 tablet 1  . sucralfate (CARAFATE) 1 g tablet Take 1 tablet (1 g total) by mouth 4 (four) times daily -  with meals and at bedtime. 120 tablet 1    Lab Results: No results found for this or any previous visit (from the past 48 hour(s)).  Blood Alcohol level:  Lab Results  Component Value Date   ETH <10 08/09/2018   ETH <10 08/01/2018    Metabolic Disorder Labs: Lab Results  Component Value Date   HGBA1C 4.8 08/11/2016   MPG 91 08/11/2016   Lab Results  Component Value Date   PROLACTIN 111.7 (H) 08/11/2016   PROLACTIN 66.8 (H) 06/17/2015   Lab Results   Component Value Date   CHOL 178 08/11/2016   TRIG 402 (H) 08/11/2016   HDL 13 (L) 08/11/2016   CHOLHDL 13.7 08/11/2016   VLDL UNABLE TO CALCULATE IF TRIGLYCERIDE OVER 400 mg/dL 16/01/9603   LDLCALC UNABLE TO CALCULATE IF TRIGLYCERIDE OVER 400 mg/dL 54/12/8117   LDLCALC 82 06/17/2015    Physical Findings: AIMS:  , ,  ,  ,    CIWA:    COWS:     Musculoskeletal: Strength & Muscle Tone: within normal limits Gait & Station: normal Patient leans: N/A  Psychiatric Specialty Exam: Physical Exam  Nursing note and vitals reviewed. Constitutional: She appears well-developed and well-nourished. No distress.  HENT:  Head: Normocephalic and atraumatic.  Eyes: EOM are normal.  Neck: Normal range of motion.  Cardiovascular: Normal rate and regular rhythm.  Respiratory: Effort normal. No respiratory distress.  Musculoskeletal: Normal range of motion.  Neurological: She is alert.    Review of Systems  Constitutional: Negative.   Respiratory: Negative.   Cardiovascular: Negative.   Gastrointestinal: Negative.   Genitourinary: Positive for frequency and urgency.       Incontinence  Neurological: Negative.   Psychiatric/Behavioral: Positive for suicidal ideas. Negative for depression, hallucinations, memory loss and substance abuse. The patient is nervous/anxious. The patient does not have insomnia.     Blood pressure 121/80, pulse 83, temperature 98.5 F (36.9 C), temperature source Oral, resp. rate 18, last menstrual period 05/28/2018, SpO2 99 %.There is no height or weight on file to calculate BMI.  General Appearance: Fairly Groomed  Eye Contact:  Good  Speech:  Pressured  Volume:  Normal  Mood:  Anxious, Dysphoric and Irritable  Affect:  Blunt and Labile  Thought Process:  Goal  Directed and Descriptions of Associations: Tangential  Orientation:  Full (Time, Place, and Person)  Thought Content:  Hallucinations: denies, Rumination and Tangential  Suicidal Thoughts:  Yes.   without intent/plan  Homicidal Thoughts:  Denies  Memory:  Fair  Judgement:  Poor  Insight:  Shallow  Psychomotor Activity:  Restlessness  Concentration:  Concentration: Poor  Recall:  Fair  Fund of Knowledge:  Fair  Language:  Good  Akathisia:  No  Handed:  Right  AIMS (if indicated):     Assets:  Financial Resources/Insurance  ADL's:  Impaired  Cognition:  Impaired,  Mild  Sleep:   Adequate     Treatment Plan Summary: Demographic Factors:  Caucasian  Loss Factors: Housing, family contact  Historical Factors: Impulsivity  Risk Reduction Factors:   Sense of responsibility, Living with another person, Positive social support and Positive therapeutic relationship  Continued Clinical Symptoms:  Anxiety, mild  Cognitive Features That Contribute To Risk:  Mild intellectual disability  Suicide Risk:  Minimal: Chronic passive suicidal ideation.  Patients presenting with no risk factors but with morbid ruminations; may be classified as minimal risk based on the severity of the depressive symptoms  Plan Of Care/Follow-up recommendations:  Daily contact with patient to assess and evaluate symptoms and progress in treatment and Medication management   Adjustment disorder with mixed disturbance of emotions and conduct: Schizoaffective disorder, depressive disorder: Klonopin 1 mg twice a day breakfast and dinner for anxiety Depakote ER 1000 mg at bedtime for mood stabilization Increase Luvox 150 mg at bedtime for anxiety and depressive disorder Discontinue Abilify 20 mg daily for depression augmentation, as this has been ineffective Schedule Zyprexa 5 mg breakfast and dinner for mood stabilization.  Asthma: -albuterol 2 puffs every hours Activity:  as tolerated Diet:  heart healthy diet   Other medical: Continue Macrobid 100 mg twice daily for total of 7 days then transition to Macrodantin 100 mg daily for UTI prevention on Thursday, 08/15/2018. Synthroid 50 mcg  daily for hypothyroidism Protonix, Pepcid, Carafate for GERD Fenofibrate for hypertriglyceridemia Nystatin powder for fungal/yeast infection prevention  Start Cogentin 2 mg twice daily for EPS prevention, and to maximize anticholinergic effect for assistance in decreasing urinary incontinence.  We will encourage patient to make urological/gynecology appointment after discharge.  Disposition: Continue working with her ACT team/Social work for placement in another group home setting, as patient is unlikely to be successful living independently.  Patient is currently staying in emergency department to receive assistance from social work on housing.   Mariel CraftSHEILA M Nami Strawder, MD 08/12/2018, 10:05 AM

## 2018-08-13 DIAGNOSIS — R4189 Other symptoms and signs involving cognitive functions and awareness: Secondary | ICD-10-CM | POA: Diagnosis not present

## 2018-08-13 DIAGNOSIS — F250 Schizoaffective disorder, bipolar type: Secondary | ICD-10-CM | POA: Diagnosis not present

## 2018-08-13 DIAGNOSIS — F70 Mild intellectual disabilities: Secondary | ICD-10-CM | POA: Diagnosis not present

## 2018-08-13 DIAGNOSIS — G809 Cerebral palsy, unspecified: Secondary | ICD-10-CM | POA: Diagnosis not present

## 2018-08-13 DIAGNOSIS — F4325 Adjustment disorder with mixed disturbance of emotions and conduct: Secondary | ICD-10-CM | POA: Diagnosis not present

## 2018-08-13 MED ORDER — OLANZAPINE 5 MG PO TABS: 5.0000 mg | ORAL_TABLET | Freq: Every day | ORAL | Status: DC

## 2018-08-13 MED ORDER — OLANZAPINE 2.5 MG PO TABS: 2.5000 mg | ORAL_TABLET | Freq: Every day | ORAL | Status: DC

## 2018-08-13 NOTE — ED Notes (Signed)
ivc  Pending  placement 

## 2018-08-13 NOTE — ED Notes (Signed)
Pt given breakfast tray

## 2018-08-13 NOTE — ED Notes (Signed)
Hourly rounding reveals patient sleeping in room. No complaints, stable, in no acute distress. Q15 minute rounds and monitoring via Rover and Officer to continue.  

## 2018-08-13 NOTE — ED Notes (Signed)
Pt ambulated to bathroom without difficulty.

## 2018-08-13 NOTE — Progress Notes (Signed)
[facility]  08/13/2018 5:16 PM Sherri DonningKimberly Green  MRN:  [id number] Subjective:  Suicidal ideation and unable to care for self Principal Problem: Adjustment disorder with mixed disturbance of emotions and conduct Diagnosis: Principal Problem:   Adjustment disorder with mixed disturbance of emotions and conduct Active Problems:   Cerebral palsy (HCC)   Mild intellectual disability   GERD (gastroesophageal reflux disease)   COPD (chronic obstructive pulmonary disease) (HCC)   Schizoaffective disorder, bipolar type (HCC)   Suicidal ideation  Patient is seen, chart is reviewed Total Time spent with patient: 15 minutes   HPI:   [name] is a 53 year old Caucasian female with a history of schizoaffective disorder, intellectual disability and borderline personality disorder.  She was seen in emergency department due to suicidal ideations, but once evaluated, she denied have them, and therefore she was discharged. Later on the same day, she came back expressing suicidal thoughts again, and racing thoughts.  Reports not been sleeping. Reports poor sleep and appetite. Concentration is baseline. Denies recent mania.   Past Psychiatric History: Schizoaffective disorder, mild intellectual disability, multiple suicide attempts  On evaluation, patient is in bed sleeping with some slurring of speech.  Patient is perseverative on wishing to be discharged to a new facility continues to have passive suicidal ideation, but does not express a specific plan at this time.  She is denying homicidal ideation.  She is denying auditory and visual hallucinations.   Past Medical History:  Past Medical History:  Diagnosis Date  . Anxiety   . Asthma   . Borderline personality disorder (HCC)   . Cerebral palsy (HCC)   . COPD (chronic obstructive pulmonary disease) (HCC)   . Depression   . GERD (gastroesophageal reflux disease)   . Mild cognitive impairment   . Schizoaffective disorder (HCC)   . Wound  abscess     Past Surgical History:  Procedure Laterality Date  . CHOLECYSTECTOMY    . INCISION AND DRAINAGE ABSCESS Right 01/02/2015   Procedure: INCISION AND DRAINAGE ABSCESS;  Surgeon: Tiney Rougealph Ely III, MD;  Location: [facility] ORS;  Service: General;  Laterality: Right;  . TONSILLECTOMY     Family History:  Family History  Problem Relation Age of Onset  . Heart attack Father   . Diabetes Mother    Family Psychiatric  History: None known  Social History:  Social History   Substance and Sexual Activity  Alcohol Use No     Social History   Substance and Sexual Activity  Drug Use No    Social History   Socioeconomic History  . Marital status: Single    Spouse name: Not on file  . Number of children: Not on file  . Years of education: Not on file  . Highest education level: Not on file  Occupational History  . Occupation: Disabled  Social Needs  . Financial resource strain: Not on file  . Food insecurity:    Worry: Not on file    Inability: Not on file  . Transportation needs:    Medical: Not on file    Non-medical: Not on file  Tobacco Use  . Smoking status: Former Games developermoker  . Smokeless tobacco: Never Used  Substance and Sexual Activity  . Alcohol use: No  . Drug use: No  . Sexual activity: Not Currently  Lifestyle  . Physical activity:    Days per week: Not on file    Minutes per session: Not on file  . Stress: Not on file  Relationships  .  Social connections:    Talks on phone: Not on file    Gets together: Not on file    Attends religious service: Not on file    Active member of club or organization: Not on file    Attends meetings of clubs or organizations: Not on file    Relationship status: Not on file  Other Topics Concern  . Not on file  Social History Narrative   Pt lives with mother, nephew, sister in law   Additional Social History:        Patient is essentially homeless at this time, as she previously was residing in a group home which had  provided her 30-day notice. Her 30-day notice is completed.                  Sleep: Good  Appetite:  Good  Current Medications: Current Facility-Administered Medications  Medication Dose Route Frequency Provider Last Rate Last Dose  . benztropine (COGENTIN) tablet 2 mg  2 mg Oral BID Reggie Pile, MD   2 mg at 08/13/18 0926  . clonazePAM (KLONOPIN) tablet 1 mg  1 mg Oral BID Reggie Pile, MD   1 mg at 08/13/18 5621  . divalproex (DEPAKOTE ER) 24 hr tablet 1,000 mg  1,000 mg Oral QHS Reggie Pile, MD   1,000 mg at 08/12/18 2117  . famotidine (PEPCID) tablet 20 mg  20 mg Oral BID Reggie Pile, MD   20 mg at 08/13/18 0925  . fenofibrate tablet 160 mg  160 mg Oral Daily Reggie Pile, MD   160 mg at 08/13/18 0925  . fluvoxaMINE (LUVOX) tablet 150 mg  150 mg Oral QHS Mariel Craft, MD   150 mg at 08/12/18 2118  . levothyroxine (SYNTHROID) tablet 50 mcg  50 mcg Oral Q0600 Reggie Pile, MD   50 mcg at 08/13/18 0615  . nitrofurantoin (MACRODANTIN) capsule 100 mg  100 mg Oral BID Reggie Pile, MD   100 mg at 08/13/18 3086  . nystatin (MYCOSTATIN/NYSTOP) topical powder   Topical BID Emily Filbert, MD      . OLANZapine [facility]) tablet 5 mg  5 mg Oral Q8H PRN Reggie Pile, MD   5 mg at 08/10/18 1644  . OLANZapine (ZYPREXA) tablet 5 mg  5 mg Oral BID WC Mariel Craft, MD   5 mg at 08/13/18 0743  . pantoprazole (PROTONIX) EC tablet 40 mg  40 mg Oral Daily Reggie Pile, MD   40 mg at 08/13/18 0925  . sucralfate (CARAFATE) tablet 1 g  1 g Oral TID WC & HS Reggie Pile, MD   1 g at 08/13/18 1317   Current Outpatient Medications  Medication Sig Dispense Refill  . ARIPiprazole (ABILIFY) 20 MG tablet Take 1 tablet (20 mg total) by mouth daily. 30 tablet 0  . benztropine (COGENTIN) 2 MG tablet Take 1 tablet (2 mg total) by mouth 2 (two) times daily. 60 tablet 0  . clonazePAM (KLONOPIN) 1 MG tablet Take 1 tablet (1 mg total) by mouth 2 (two) times daily. 30 tablet 0  . divalproex  (DEPAKOTE ER) 500 MG 24 hr tablet Take 2 tablets (1,000 mg total) by mouth at bedtime. 60 tablet 0  . famotidine (PEPCID) 20 MG tablet Take 1 tablet (20 mg total) by mouth 2 (two) times daily. 60 tablet 0  . fenofibrate 160 MG tablet Take 1 tablet (160 mg total) by mouth daily. 30 tablet 1  . fluvoxaMINE (LUVOX) 100 MG tablet Take 1  tablet (100 mg total) by mouth at bedtime. 30 tablet 0  . levothyroxine (SYNTHROID) 50 MCG tablet Take 1 tablet (50 mcg total) by mouth daily before breakfast. 30 tablet 1  . nitrofurantoin (MACRODANTIN) 100 MG capsule Take 1 capsule (100 mg total) by mouth 2 (two) times daily. 60 capsule 0  . OLANZapine (ZYPREXA) 5 MG tablet Take 1 tablet (5 mg total) by mouth every 8 (eight) hours as needed (agitation). 30 tablet 0  . pantoprazole (PROTONIX) 40 MG tablet Take 1 tablet (40 mg total) by mouth daily. 30 tablet 1  . sucralfate (CARAFATE) 1 g tablet Take 1 tablet (1 g total) by mouth 4 (four) times daily -  with meals and at bedtime. 120 tablet 1    Lab Results: No results found for this or any previous visit (from the past 48 hour(s)).  Blood Alcohol level:  Lab Results  Component Value Date   ETH <10 08/09/2018   ETH <10 08/01/2018    Metabolic Disorder Labs: Lab Results  Component Value Date   HGBA1C 4.8 08/11/2016   MPG 91 08/11/2016   Lab Results  Component Value Date   PROLACTIN 111.7 (H) 08/11/2016   PROLACTIN 66.8 (H) 06/17/2015   Lab Results  Component Value Date   CHOL 178 08/11/2016   TRIG 402 (H) 08/11/2016   HDL 13 (L) 08/11/2016   CHOLHDL 13.7 08/11/2016   VLDL UNABLE TO CALCULATE IF TRIGLYCERIDE OVER 400 mg/dL 51/88/4166   LDLCALC UNABLE TO CALCULATE IF TRIGLYCERIDE OVER 400 mg/dL 10/15/1599   LDLCALC 82 06/17/2015    Physical Findings: AIMS:  , ,  ,  ,    CIWA:    COWS:     Musculoskeletal: Strength & Muscle Tone: within normal limits Gait & Station: normal Patient leans: N/A  Psychiatric Specialty Exam: Physical Exam   Nursing note and vitals reviewed. Constitutional: She appears well-nourished. No distress.  HENT:  Head: Normocephalic and atraumatic.  Eyes: EOM are normal.  Neck: Normal range of motion.  Cardiovascular: Normal rate and regular rhythm.  Respiratory: Effort normal. No respiratory distress.  Musculoskeletal: Normal range of motion.    Review of Systems  Constitutional: Negative.   Respiratory: Negative.   Cardiovascular: Negative.   Gastrointestinal: Negative.   Genitourinary: Positive for frequency and urgency.       Incontinence  Neurological: Negative.   Psychiatric/Behavioral: Positive for depression and suicidal ideas. Negative for hallucinations, memory loss and substance abuse. The patient is nervous/anxious. The patient does not have insomnia.     Blood pressure 121/64, pulse 67, temperature 97.9 F (36.6 C), temperature source Oral, resp. rate 18, last menstrual period 05/28/2018, SpO2 97 %.There is no height or weight on file to calculate BMI.  General Appearance: Disheveled  Eye Contact:  Fair  Speech:  Garbled  Volume:  Decreased  Mood:  Dysphoric  Affect:  Blunt and Sedated  Thought Process:  Descriptions of Associations: Tangential  Orientation:  Full (Time, Place, and Person)  Thought Content:  Hallucinations: denies, Rumination and Tangential  Suicidal Thoughts:  Yes.  without intent/plan  Homicidal Thoughts:  Denies  Memory:  Fair  Judgement:  Poor  Insight:  Shallow  Psychomotor Activity:  Psychomotor Retardation  Concentration:  Concentration: Poor  Recall:  [employer] of Knowledge:  Fair  Language:  Good  Akathisia:  No  Handed:  Right  AIMS (if indicated):     Assets:  Financial Resources/Insurance  ADL's:  Impaired  Cognition:  Impaired,  Mild  Sleep:   Adequate     Treatment Plan Summary: Demographic Factors:  Caucasian  Loss Factors: Housing, family contact  Historical Factors: Impulsivity  Risk Reduction Factors:   Sense of  responsibility, Living with another person, Positive social support and Positive therapeutic relationship  Continued Clinical Symptoms:  Anxiety, mild  Cognitive Features That Contribute To Risk:  Mild intellectual disability  Suicide Risk:  Minimal: Chronic passive suicidal ideation.  Patients presenting with no risk factors but with morbid ruminations; may be classified as minimal risk based on the severity of the depressive symptoms  Plan Of Care/Follow-up recommendations:  Daily contact with patient to assess and evaluate symptoms and progress in treatment and Medication management   Adjustment disorder with mixed disturbance of emotions and conduct: Schizoaffective disorder, depressive disorder: Patient appears more sedated today with addition of scheduled Zyprexa.  Will discontinue morning dose at this time and continue to reevaluate Klonopin 1 mg twice a day breakfast and dinner for anxiety Depakote ER 1000 mg at bedtime for mood stabilization Increase Luvox 150 mg at bedtime for anxiety and depressive disorder Discontinue Abilify 20 mg daily for depression augmentation, as this has been ineffective Schedule Zyprexa 5 mg with evening meal for mood stabilization.  Asthma: -albuterol 2 puffs every hours Activity:  as tolerated Diet:  heart healthy diet   Other medical: Continue Macrobid 100 mg twice daily for total of 7 days then transition to Macrodantin 100 mg daily for UTI prevention on Thursday, 08/15/2018. Synthroid 50 mcg daily for hypothyroidism Protonix, Pepcid, Carafate for GERD Fenofibrate for hypertriglyceridemia Nystatin powder for fungal/yeast infection prevention  Start Cogentin 2 mg twice daily for EPS prevention, and to maximize anticholinergic effect for assistance in decreasing urinary incontinence.  We will encourage patient to make urological/gynecology appointment after discharge.  Disposition: Continue working with her ACT team/Social work for  placement in another group home setting, as patient is unlikely to be successful living independently.  Patient is currently staying in emergency department to receive assistance from social work on housing.   Mariel Craft, MD 08/13/2018, 5:16 PM

## 2018-08-13 NOTE — ED Notes (Signed)
Lunch tray placed in room. Pt asleep.  

## 2018-08-13 NOTE — ED Notes (Signed)
Assisted patient with bedside bath, clean scrubs and changed soiled linen on bed. AS

## 2018-08-13 NOTE — ED Notes (Signed)
Pt stating she wants to leave and cussing at staff. Room door closed. Maintained on 15 minute checks and observation by security  for safety.

## 2018-08-13 NOTE — ED Notes (Signed)
Report to include Situation, Background, Assessment, and Recommendations received from RN. Patient alert and oriented, warm and dry, in no acute distress. Patient reported SI and HI. Denied AVH and pain. Patient made aware of Q15 minute rounds and Psychologist, counselling presence for their safety.Patient instructed to come to me with needs or concerns.

## 2018-08-13 NOTE — ED Notes (Signed)
Pt in rm stating, "I want to go to [facility]. I want to leave this fucking hospital." Pt made aware that she can not leave when she wants d/t being IVC.

## 2018-08-13 NOTE — ED Notes (Signed)
Change patients bedding,clothing ,and socks. The pt urinated in the bed and did not tell anyone. I explained she needed to get up and use the restroom. The pt stated she was sorry and she would use the restroom next time.

## 2018-08-14 DIAGNOSIS — F70 Mild intellectual disabilities: Secondary | ICD-10-CM | POA: Diagnosis not present

## 2018-08-14 DIAGNOSIS — F250 Schizoaffective disorder, bipolar type: Secondary | ICD-10-CM | POA: Diagnosis not present

## 2018-08-14 DIAGNOSIS — R4189 Other symptoms and signs involving cognitive functions and awareness: Secondary | ICD-10-CM | POA: Diagnosis not present

## 2018-08-14 DIAGNOSIS — R443 Hallucinations, unspecified: Secondary | ICD-10-CM

## 2018-08-14 DIAGNOSIS — R32 Unspecified urinary incontinence: Secondary | ICD-10-CM

## 2018-08-14 DIAGNOSIS — F4325 Adjustment disorder with mixed disturbance of emotions and conduct: Secondary | ICD-10-CM | POA: Diagnosis not present

## 2018-08-14 DIAGNOSIS — G809 Cerebral palsy, unspecified: Secondary | ICD-10-CM | POA: Diagnosis not present

## 2018-08-14 MED ORDER — LORAZEPAM 2 MG/ML IJ SOLN
INTRAMUSCULAR | Status: AC
  Filled 2018-08-14: qty 1

## 2018-08-14 MED ORDER — HALOPERIDOL LACTATE 5 MG/ML IJ SOLN
INTRAMUSCULAR | Status: AC
  Filled 2018-08-14: qty 1

## 2018-08-14 MED ORDER — LORAZEPAM 2 MG/ML IJ SOLN
2.0000 mg | Freq: Three times a day (TID) | INTRAMUSCULAR | Status: DC | PRN
  Administered 2018-08-14: 2 mg via INTRAMUSCULAR

## 2018-08-14 MED ORDER — DIPHENHYDRAMINE HCL 50 MG/ML IJ SOLN
INTRAMUSCULAR | Status: AC
  Administered 2018-08-24: 50 mg via INTRAMUSCULAR
  Filled 2018-08-14: qty 1

## 2018-08-14 MED ORDER — OLANZAPINE 5 MG PO TABS
2.5000 mg | ORAL_TABLET | Freq: Two times a day (BID) | ORAL | Status: DC
  Administered 2018-08-15 – 2018-08-24 (×15): 2.5 mg via ORAL
  Filled 2018-08-14 (×14): qty 1

## 2018-08-14 MED ORDER — HALOPERIDOL LACTATE 5 MG/ML IJ SOLN
5.0000 mg | Freq: Three times a day (TID) | INTRAMUSCULAR | Status: DC | PRN
  Administered 2018-08-14: 5 mg via INTRAMUSCULAR

## 2018-08-14 MED ORDER — LORAZEPAM 2 MG PO TABS
2.0000 mg | ORAL_TABLET | Freq: Three times a day (TID) | ORAL | Status: DC | PRN
  Filled 2018-08-14 (×2): qty 1

## 2018-08-14 MED ORDER — DIPHENHYDRAMINE HCL 50 MG/ML IJ SOLN
50.0000 mg | Freq: Three times a day (TID) | INTRAMUSCULAR | Status: DC | PRN
  Administered 2018-08-14 – 2018-08-24 (×2): 50 mg via INTRAMUSCULAR
  Filled 2018-08-14: qty 1

## 2018-08-14 MED ORDER — DIPHENHYDRAMINE HCL 25 MG PO CAPS
50.0000 mg | ORAL_CAPSULE | Freq: Three times a day (TID) | ORAL | Status: DC | PRN
  Administered 2018-08-14 – 2018-09-15 (×10): 50 mg via ORAL
  Filled 2018-08-14 (×10): qty 2

## 2018-08-14 MED ORDER — HALOPERIDOL 5 MG PO TABS: 5.0000 mg | ORAL_TABLET | Freq: Three times a day (TID) | ORAL | Status: DC | PRN

## 2018-08-14 NOTE — ED Notes (Signed)
Patient assigned to appropriate care area   Introduced self to pt  Patient oriented to unit/care area: Informed that, for their safety, care areas are designed for safety and visiting and phone hours explained to patient. Patient verbalizes understanding, and verbal contract for safety obtained  Environment secured  

## 2018-08-14 NOTE — ED Notes (Signed)
Hourly rounding reveals patient in room. No complaints, stable, in no acute distress. Q15 minute rounds and monitoring via Rover and Officer to continue.   

## 2018-08-14 NOTE — ED Notes (Signed)
BEHAVIORAL HEALTH ROUNDING Patient sleeping: No. Patient alert and oriented: yes Behavior appropriate: Yes.  ; If no, describe:  Nutrition and fluids offered: yes Toileting and hygiene offered: Yes  Sitter present: q15 minute observations and security camera monitoring Law enforcement present: Yes  ODS  

## 2018-08-14 NOTE — ED Notes (Addendum)
She has not stopped with her anger/aggressive behavior  Beating on the door to the nurses station  - shaking the door knob   Speaking to the security officer  "Fuck you  - I'm going to get y'all - you are so stupid - I am my own guardian  - I am going to the hotel to live.  - fuck y'all - I'll kill you"  Medication to be administered

## 2018-08-14 NOTE — ED Notes (Signed)
BEHAVIORAL HEALTH ROUNDING Patient sleeping: Yes.   Patient alert and oriented: eyes closed  Appears asleep Behavior appropriate: Yes.  ; If no, describe:  Nutrition and fluids offered: Yes  Toileting and hygiene offered: sleeping Sitter present: q 15 minute observations and security camera monitoring Law enforcement present: yes  ODS 

## 2018-08-14 NOTE — ED Notes (Signed)
Pt. Asking "Where am I going".  Pt. Told "We are trying to find you a safe place to live".  Pt. Ok with this answer.  Pt. Returned to room.

## 2018-08-14 NOTE — ED Notes (Signed)
She is banging on the door of the nurses station - "Get me the phone - right now - you fucking bitch."  Pt told that due to her calling 911 excessively I will allow her the phone once she can calm down and stop beating on the door and cussing at myself, the doctor and security   "Fuck you  - you don't tell me what to do  - i'll kick your ass if you don't bring me the fucking phone right now."  Again I attempted to speak with her about IVC - the importance of taking her medicine every day and that will assist social work in finding her a new group home to live in"  She continues to verbalize anger

## 2018-08-14 NOTE — ED Notes (Signed)
ED  Is the patient under IVC or is there intent for IVC: Yes.   Is the patient medically cleared: Yes.   Is there vacancy in the ED BHU: Yes.   Is the population mix appropriate for patient: Yes.   Is the patient awaiting placement in inpatient or outpatient setting: Yes.  - social work consult progress  Has the patient had a psychiatric consult: Yes.   Survey of unit performed for contraband, proper placement and condition of furniture, tampering with fixtures in bathroom, shower, and each patient room: Yes.  ; Findings:  APPEARANCE/BEHAVIOR Calm and cooperative NEURO ASSESSMENT Orientation: oriented x3  Denies pain Hallucinations: No.None noted (Hallucinations)  denies Speech: Normal Gait: normal RESPIRATORY ASSESSMENT Even  Unlabored respirations  CARDIOVASCULAR ASSESSMENT Pulses equal   regular rate  Skin warm and dry   GASTROINTESTINAL ASSESSMENT no GI complaint EXTREMITIES Full ROM  PLAN OF CARE Provide calm/safe environment. Vital signs assessed twice daily. ED BHU Assessment once each 12-hour shift. Collaborate with TTS daily or as condition indicates. Assure the ED provider has rounded once each shift. Provide and encourage hygiene. Provide redirection as needed. Assess for escalating behavior; address immediately and inform ED provider.  Assess family dynamic and appropriateness for visitation as needed: Yes.  ; If necessary, describe findings:  Educate the patient/family about BHU procedures/visitation: Yes.  ; If necessary, describe findings:

## 2018-08-14 NOTE — ED Provider Notes (Signed)
-----------------------------------------   7:15 AM on 08/14/2018 -----------------------------------------   Blood pressure 137/72, pulse 61, temperature 98.2 F (36.8 C), temperature source Oral, resp. rate 18, last menstrual period 05/28/2018, SpO2 99 %.  The patient is calm and cooperative at this time.  There have been no acute events since the last update.  Awaiting disposition plan from Behavioral Medicine team and/or social work.   Loleta Rose, MD 08/14/18 [phone]

## 2018-08-14 NOTE — ED Notes (Signed)
Patient is asking "am I going to have a 1:1, to have someone come in here and sit with me, to keep me company", Informed her we dont just allow staff to sit with patient to keep them company. Patient said "ok"

## 2018-08-14 NOTE — ED Notes (Signed)
Patient ate 75% of dinner

## 2018-08-14 NOTE — ED Notes (Signed)

## 2018-08-14 NOTE — ED Notes (Signed)
She has been seen the psychiatrist this am -  I have attempted to administer her am medications but she refuses - talked with her about her plan of care including IVC and taking her medicine so that a group home will accept her  - pt stating  "I am ready to go - go get my stuff - I can leave I am my own guardian"  Attempts made to educate her about IVC and that she cannot leave even though she is her own guardian  "fuck you Jahad Old - you are a liar - You don't know."  Patiently I talked with her and wrote her down some expectations that will help [place] find her a group home and help the doctor reverse the IVC  Pt stating  "You don't know shit Analaya Hoey - Fuck you - you don't know shit"   Continue to monitor

## 2018-08-14 NOTE — TOC Progression Note (Signed)
Transition of Care [facility]) - Progression Note    Patient Details  Name: [name] MRN: [id number] Date of Birth: Mar 25, 1966  [facility] CM/SW Contact  [name], LCSW Phone Number: [phone] 08/14/2018, 9:51 AM  Clinical Narrative:    9:47am -  Florencio from pt's CST team returned call to CSW. Florencio stated that pt's placement has been difficult due to "everything going on with COVID-19." Florencio shared that he will follow-up with a group home in [place] that stated that they have a vacancy, however have three people waiting for it as well.  CSW was informed that pt's care coordinator is attempting to get her into a level 2 group home, however that can take months.  Pt's care coordinator has already reached out to [employer].        Expected Discharge Plan and Services                                                 Social Determinants of Health (SDOH) Interventions    Readmission Risk Interventions No flowsheet data found.

## 2018-08-14 NOTE — ED Notes (Signed)
Patient was given clean scrubs and went into bathroom washing up and changing her clothes at this time due to wetting on herself .

## 2018-08-14 NOTE — ED Notes (Signed)
Pt. Got up from bed and walked to bathroom.  Pt. Had soiled pants and bed pad.  Pt. Given new scrubs and bed pad.

## 2018-08-14 NOTE — Progress Notes (Signed)
[facility]  08/14/2018 9:28 AM Sherri DonningKimberly Green  MRN:  [id number] Subjective:  Suicidal ideation and unable to care for self Principal Problem: Adjustment disorder with mixed disturbance of emotions and conduct Diagnosis: Principal Problem:   Adjustment disorder with mixed disturbance of emotions and conduct Active Problems:   Cerebral palsy (HCC)   Mild intellectual disability   GERD (gastroesophageal reflux disease)   COPD (chronic obstructive pulmonary disease) (HCC)   Schizoaffective disorder, bipolar type (HCC)   Suicidal ideation  Patient is seen, chart is reviewed Total Time spent with patient: 35 min   HPI:   Sherri BradfordKimberly is a 53 year old Caucasian female with a history of schizoaffective disorder, intellectual disability and borderline personality disorder.  She was seen in emergency department due to suicidal ideations, but once evaluated, she denied have them, and therefore she was discharged. Later on the same day, she came back expressing suicidal thoughts again, and racing thoughts.  Reports not been sleeping. Reports poor sleep and appetite. Concentration is baseline. Denies recent mania.   Past Psychiatric History: Schizoaffective disorder, mild intellectual disability, multiple suicide attempts  On evaluation, patient is awake, agitated, verbally aggressive and confrontational.  She is cursing at staff and refusing medications.  Patient reports that she is having auditory hallucinations of a derogatory nature, and seeing bugs and rats running across the floors.  Patient is banging on windows and shaking doors in the behavioral health unit, requiring as needed medications for patient and staff safety.  Continues to pace in hallways after receiving medication and is incontinent of urine.  She reports, that "the shots are not working", however continues to refuse her regular medication.  Patient is perseverative on wishing to be discharged to a new facility continues to  have passive suicidal ideation, but does not express a specific plan at this time.  She is denying homicidal ideation.    Past Medical History:  Past Medical History:  Diagnosis Date  . Anxiety   . Asthma   . Borderline personality disorder (HCC)   . Cerebral palsy (HCC)   . COPD (chronic obstructive pulmonary disease) (HCC)   . Depression   . GERD (gastroesophageal reflux disease)   . Mild cognitive impairment   . Schizoaffective disorder (HCC)   . Wound abscess     Past Surgical History:  Procedure Laterality Date  . CHOLECYSTECTOMY    . INCISION AND DRAINAGE ABSCESS Right 01/02/2015   Procedure: INCISION AND DRAINAGE ABSCESS;  Surgeon: Tiney Rougealph Ely III, MD;  Location: [facility] ORS;  Service: General;  Laterality: Right;  . TONSILLECTOMY     Family History:  Family History  Problem Relation Age of Onset  . Heart attack Father   . Diabetes Mother    Family Psychiatric  History: None known  Social History:  Social History   Substance and Sexual Activity  Alcohol Use No     Social History   Substance and Sexual Activity  Drug Use No    Social History   Socioeconomic History  . Marital status: Single    Spouse name: Not on file  . Number of children: Not on file  . Years of education: Not on file  . Highest education level: Not on file  Occupational History  . Occupation: Disabled  Social Needs  . Financial resource strain: Not on file  . Food insecurity:    Worry: Not on file    Inability: Not on file  . Transportation needs:    Medical: Not on file  Non-medical: Not on file  Tobacco Use  . Smoking status: Former Games developer  . Smokeless tobacco: Never Used  Substance and Sexual Activity  . Alcohol use: No  . Drug use: No  . Sexual activity: Not Currently  Lifestyle  . Physical activity:    Days per week: Not on file    Minutes per session: Not on file  . Stress: Not on file  Relationships  . Social connections:    Talks on phone: Not on file    Gets  together: Not on file    Attends religious service: Not on file    Active member of club or organization: Not on file    Attends meetings of clubs or organizations: Not on file    Relationship status: Not on file  Other Topics Concern  . Not on file  Social History Narrative   Pt lives with mother, nephew, sister in law   Additional Social History:        Patient is essentially homeless at this time, as she previously was residing in a group home which had provided her 30-day notice. Her 30-day notice is completed.                  Sleep: Good  Appetite:  Good  Current Medications: Current Facility-Administered Medications  Medication Dose Route Frequency Provider Last Rate Last Dose  . benztropine (COGENTIN) tablet 2 mg  2 mg Oral BID Reggie Pile, MD   2 mg at 08/13/18 2114  . clonazePAM (KLONOPIN) tablet 1 mg  1 mg Oral BID Reggie Pile, MD   1 mg at 08/13/18 2114  . divalproex (DEPAKOTE ER) 24 hr tablet 1,000 mg  1,000 mg Oral QHS Reggie Pile, MD   1,000 mg at 08/13/18 2114  . famotidine (PEPCID) tablet 20 mg  20 mg Oral BID Reggie Pile, MD   20 mg at 08/13/18 2115  . fenofibrate tablet 160 mg  160 mg Oral Daily Reggie Pile, MD   160 mg at 08/13/18 0925  . fluvoxaMINE (LUVOX) tablet 150 mg  150 mg Oral QHS Mariel Craft, MD   150 mg at 08/13/18 2113  . levothyroxine (SYNTHROID) tablet 50 mcg  50 mcg Oral Q0600 Reggie Pile, MD   50 mcg at 08/14/18 0516  . nitrofurantoin (MACRODANTIN) capsule 100 mg  100 mg Oral BID Reggie Pile, MD   100 mg at 08/13/18 2113  . nystatin (MYCOSTATIN/NYSTOP) topical powder   Topical BID Emily Filbert, MD      . OLANZapine [facility]) tablet 2.5 mg  2.5 mg Oral Q lunch Mariel Craft, MD      . OLANZapine [facility]) tablet 5 mg  5 mg Oral Q8H PRN Reggie Pile, MD   5 mg at 08/10/18 1644  . pantoprazole (PROTONIX) EC tablet 40 mg  40 mg Oral Daily Reggie Pile, MD   40 mg at 08/13/18 0925  . sucralfate (CARAFATE) tablet 1 g  1 g  Oral TID WC & HS Reggie Pile, MD   1 g at 08/13/18 2113   Current Outpatient Medications  Medication Sig Dispense Refill  . ARIPiprazole (ABILIFY) 20 MG tablet Take 1 tablet (20 mg total) by mouth daily. 30 tablet 0  . benztropine (COGENTIN) 2 MG tablet Take 1 tablet (2 mg total) by mouth 2 (two) times daily. 60 tablet 0  . clonazePAM (KLONOPIN) 1 MG tablet Take 1 tablet (1 mg total) by mouth 2 (two) times daily. 30 tablet 0  .  divalproex (DEPAKOTE ER) 500 MG 24 hr tablet Take 2 tablets (1,000 mg total) by mouth at bedtime. 60 tablet 0  . famotidine (PEPCID) 20 MG tablet Take 1 tablet (20 mg total) by mouth 2 (two) times daily. 60 tablet 0  . fenofibrate 160 MG tablet Take 1 tablet (160 mg total) by mouth daily. 30 tablet 1  . fluvoxaMINE (LUVOX) 100 MG tablet Take 1 tablet (100 mg total) by mouth at bedtime. 30 tablet 0  . levothyroxine (SYNTHROID) 50 MCG tablet Take 1 tablet (50 mcg total) by mouth daily before breakfast. 30 tablet 1  . nitrofurantoin (MACRODANTIN) 100 MG capsule Take 1 capsule (100 mg total) by mouth 2 (two) times daily. 60 capsule 0  . OLANZapine (ZYPREXA) 5 MG tablet Take 1 tablet (5 mg total) by mouth every 8 (eight) hours as needed (agitation). 30 tablet 0  . pantoprazole (PROTONIX) 40 MG tablet Take 1 tablet (40 mg total) by mouth daily. 30 tablet 1  . sucralfate (CARAFATE) 1 g tablet Take 1 tablet (1 g total) by mouth 4 (four) times daily -  with meals and at bedtime. 120 tablet 1    Lab Results: No results found for this or any previous visit (from the past 48 hour(s)).  Blood Alcohol level:  Lab Results  Component Value Date   ETH <10 08/09/2018   ETH <10 08/01/2018    Metabolic Disorder Labs: Lab Results  Component Value Date   HGBA1C 4.8 08/11/2016   MPG 91 08/11/2016   Lab Results  Component Value Date   PROLACTIN 111.7 (H) 08/11/2016   PROLACTIN 66.8 (H) 06/17/2015   Lab Results  Component Value Date   CHOL 178 08/11/2016   TRIG 402 (H)  08/11/2016   HDL 13 (L) 08/11/2016   CHOLHDL 13.7 08/11/2016   VLDL UNABLE TO CALCULATE IF TRIGLYCERIDE OVER 400 mg/dL 45/85/9292   LDLCALC UNABLE TO CALCULATE IF TRIGLYCERIDE OVER 400 mg/dL 44/62/8638   LDLCALC 82 06/17/2015    Physical Findings: AIMS:  , ,  ,  ,    CIWA:    COWS:     Musculoskeletal: Strength & Muscle Tone: within normal limits Gait & Station: normal Patient leans: N/A  Psychiatric Specialty Exam: Physical Exam  Nursing note and vitals reviewed. Constitutional: She appears well-nourished. No distress.  HENT:  Head: Normocephalic and atraumatic.  Eyes: EOM are normal.  Neck: Normal range of motion.  Cardiovascular: Normal rate and regular rhythm.  Respiratory: Effort normal. No respiratory distress.  Musculoskeletal: Normal range of motion.  Psychiatric: Her speech is normal. Her affect is angry, labile and inappropriate. She is agitated, aggressive and actively hallucinating. Cognition and memory are impaired. She expresses impulsivity and inappropriate judgment. She expresses suicidal ideation. She expresses no homicidal ideation. She expresses no suicidal plans. She is inattentive.    Review of Systems  Constitutional: Negative.   Respiratory: Negative.   Cardiovascular: Negative.   Gastrointestinal: Negative.   Genitourinary: Positive for frequency and urgency.       Incontinence  Neurological: Negative.   Psychiatric/Behavioral: Positive for depression and suicidal ideas. Negative for hallucinations, memory loss and substance abuse. The patient is nervous/anxious. The patient does not have insomnia.     Blood pressure 137/72, pulse 61, temperature 98.2 F (36.8 C), temperature source Oral, resp. rate 18, last menstrual period 05/28/2018, SpO2 99 %.There is no height or weight on file to calculate BMI.  General Appearance: Disheveled  Eye Contact:  Good  Speech:  Clear and  Coherent  Volume:  Increased  Mood:  Angry, Dysphoric and Irritable   Affect:  Inappropriate and Labile  Thought Process:  Descriptions of Associations: Tangential  Orientation:  Full (Time, Place, and Person)  Thought Content:  Hallucinations: Auditory Visual, Rumination and Tangential  Suicidal Thoughts:  Yes.  without intent/plan  Homicidal Thoughts:  Denies  Memory:  Fair  Judgement:  Poor  Insight:  Shallow  Psychomotor Activity:  Restlessness  Concentration:  Concentration: Poor  Recall:  [employer] of Knowledge:  Fair  Language:  Good  Akathisia:  No  Handed:  Right  AIMS (if indicated):     Assets:  Financial Resources/Insurance  ADL's:  Impaired  Cognition:  Impaired,  Mild  Sleep:   Adequate     Treatment Plan Summary: Demographic Factors:  Caucasian  Loss Factors: Housing, family contact  Historical Factors: Impulsivity  Risk Reduction Factors:   Sense of responsibility, Living with another person, Positive social support and Positive therapeutic relationship  Continued Clinical Symptoms:  Anxiety, mild  Cognitive Features That Contribute To Risk:  Mild intellectual disability  Suicide Risk:  Minimal: Chronic passive suicidal ideation.  Patients presenting with no risk factors but with morbid ruminations; may be classified as minimal risk based on the severity of the depressive symptoms  Plan Of Care/Follow-up recommendations:  Daily contact with patient to assess and evaluate symptoms and progress in treatment and Medication management   Adjustment disorder with mixed disturbance of emotions and conduct: Schizoaffective disorder, depressive disorder: Patient agitated and aggressive today with refusal of medication. Klonopin 1 mg twice a day breakfast and dinner for anxiety-refused this morning's dose Depakote ER 1000 mg at bedtime for mood stabilization Luvox 150 mg at bedtime for anxiety and depressive disorder Zyprexa 2.5 mg twice a day with morning and evening meal for mood stabilization.-Patient has  refused this morning's dose  Patient required as needed medication today IM and received Haldol, Benadryl, and Ativan.  Asthma: -albuterol 2 puffs every hours Activity:  as tolerated Diet:  heart healthy diet   Other medical: Continue Macrobid 100 mg twice daily for total of 7 days then transition to Macrodantin 100 mg daily for UTI prevention on Thursday, 08/15/2018. Synthroid 50 mcg daily for hypothyroidism Protonix, Pepcid, Carafate for GERD Fenofibrate for hypertriglyceridemia Nystatin powder for fungal/yeast infection prevention  Start Cogentin 2 mg twice daily for EPS prevention, and to maximize anticholinergic effect for assistance in decreasing urinary incontinence. -Refused morning medication and had an episode of daytime incontinence, uncertain if this was volitional versus cholinergic medication has been effective.  We will encourage patient to make urological/gynecology appointment after discharge.  Disposition: Continue working with her ACT team/Social work for placement in another group home setting, as patient is unlikely to be successful living independently.  Patient is currently staying in emergency department to receive assistance from social work on housing.  Patient is made aware that she will need to have insured medication compliance before discharge.   Mariel Craft, MD 08/14/2018, 9:28 AM

## 2018-08-14 NOTE — ED Notes (Signed)
Patient is asking nurse if she could speak to medical doctor, or social worker she wants to know "what are yall going to do with me", patient is asking to go cash her check and go to another motel. Informed patient Florencia and social work are attempting to find a placement for her, let her know she has to be patient.

## 2018-08-14 NOTE — TOC Progression Note (Signed)
Transition of Care (TOC) - Progression Note    CSW contacted [name] on pt's CST team to ask about update regarding group home placement. Voicemail was left, and CSW phone number provided.   [name]  [facility] ED  [phone]

## 2018-08-14 NOTE — ED Notes (Signed)
Pt transferred into ED BHU room 5  Patient assigned to appropriate care area. Patient oriented to unit/care area: Informed that, for her safety, care areas are designed for safety and monitored by security cameras at all times; Visiting hours and phone times explained to patient. Patient verbalizes understanding, and verbal contract for safety obtained.

## 2018-08-15 DIAGNOSIS — R4189 Other symptoms and signs involving cognitive functions and awareness: Secondary | ICD-10-CM | POA: Diagnosis not present

## 2018-08-15 LAB — VALPROIC ACID LEVEL: Valproic Acid Lvl: 43 ug/mL — ABNORMAL LOW (ref 50.0–100.0)

## 2018-08-15 NOTE — ED Notes (Signed)
ED  Is the patient under IVC or is there intent for IVC: Yes.   Is the patient medically cleared: Yes.   Is there vacancy in the ED BHU: Yes.   Closed  Is the population mix appropriate for patient: Yes.   Is the patient awaiting placement in inpatient or outpatient setting: Yes. Social work consult in progress  Has the patient had a psychiatric consult: Yes.   Survey of unit performed for contraband, proper placement and condition of furniture, tampering with fixtures in bathroom, shower, and each patient room: Yes.  ; Findings:  APPEARANCE/BEHAVIOR  cooperative NEURO ASSESSMENT Orientation: oriented x3  Denies pain Hallucinations: No.None noted (Hallucinations)  denies Speech: Normal Gait: normal RESPIRATORY ASSESSMENT Even  Unlabored respirations  CARDIOVASCULAR ASSESSMENT Pulses equal   regular rate  Skin warm and dry   GASTROINTESTINAL ASSESSMENT no GI complaint EXTREMITIES Full ROM  PLAN OF CARE Provide calm/safe environment. Vital signs assessed twice daily. ED BHU Assessment once each 12-hour shift. Collaborate with TTS daily or as condition indicates. Assure the ED provider has rounded once each shift. Provide and encourage hygiene. Provide redirection as needed. Assess for escalating behavior; address immediately and inform ED provider.  Assess family dynamic and appropriateness for visitation as needed: Yes.  ; If necessary, describe findings:  Educate the patient/family about BHU procedures/visitation: Yes.  ; If necessary, describe findings:

## 2018-08-15 NOTE — ED Notes (Signed)
Hourly rounding reveals patient in room. No complaints, stable, in no acute distress. Q15 minute rounds and monitoring via Rover and Officer to continue.   

## 2018-08-15 NOTE — ED Notes (Signed)
Hourly rounding reveals patient in room. Stable, in no acute distress. Q15 minute rounds and monitoring via Rover and Officer to continue.  

## 2018-08-15 NOTE — ED Notes (Signed)
BEHAVIORAL HEALTH ROUNDING Patient sleeping: No. Patient alert and oriented: yes Behavior appropriate: Yes.  ; If no, describe:  Nutrition and fluids offered: yes Toileting and hygiene offered: Yes  Sitter present: q15 minute observations and security  monitoring Law enforcement present: Yes  ODS  

## 2018-08-15 NOTE — ED Notes (Signed)
Patient asking to go to [place].

## 2018-08-15 NOTE — ED Notes (Signed)
BEHAVIORAL HEALTH ROUNDING Patient sleeping: Yes.   Patient alert and oriented: eyes closed  Appears asleep Behavior appropriate: Yes.  ; If no, describe:  Nutrition and fluids offered: Yes  Toileting and hygiene offered: sleeping Sitter present: q 15 minute observations and security monitoring Law enforcement present: yes  ODS 

## 2018-08-15 NOTE — ED Notes (Signed)

## 2018-08-15 NOTE — Progress Notes (Signed)
[facility] MD Progress Note  08/15/2018 9:37 AM [name]  MRN:  [id number] Subjective:  Suicidal ideation and unable to care for self Principal Problem: Adjustment disorder with mixed disturbance of emotions and conduct Diagnosis: Principal Problem:   Adjustment disorder with mixed disturbance of emotions and conduct Active Problems:   Cerebral palsy (HCC)   Mild intellectual disability   GERD (gastroesophageal reflux disease)   COPD (chronic obstructive pulmonary disease) (HCC)   Schizoaffective disorder, bipolar type (HCC)   Suicidal ideation  Patient is seen, chart is reviewed Total Time spent with patient: 15 minutes   HPI:   [name] is a 53 year old Caucasian female with a history of schizoaffective disorder, intellectual disability and borderline personality disorder.  She was seen in emergency department due to suicidal ideations, but once evaluated, she denied have them, and therefore she was discharged. Later on the same day, she came back expressing suicidal thoughts again, and racing thoughts.  Reports not been sleeping. Reports poor sleep and appetite. Concentration is baseline. Denies recent mania.   Past Psychiatric History: Schizoaffective disorder, mild intellectual disability, multiple suicide attempts.  On evaluation, patient is sedated, and slept through breakfast and lunch.  She had not had morning medications, and has been incontinent of urine.  Patient is calm and cooperative.  She asks for assistance to sit up and to have her food.  She reports that she has been having some back pain.  She asks if there is any news about a new group home, but does not get agitated that nothing has been found to this point.  This morning patient denies SI, HI, AVH.  Past Medical History:  Past Medical History:  Diagnosis Date  . Anxiety   . Asthma   . Borderline personality disorder (HCC)   . Cerebral palsy (HCC)   . COPD (chronic obstructive pulmonary disease) (HCC)   .  Depression   . GERD (gastroesophageal reflux disease)   . Mild cognitive impairment   . Schizoaffective disorder (HCC)   . Wound abscess     Past Surgical History:  Procedure Laterality Date  . CHOLECYSTECTOMY    . INCISION AND DRAINAGE ABSCESS Right 01/02/2015   Procedure: INCISION AND DRAINAGE ABSCESS;  Surgeon: Tiney Rouge III, MD;  Location: [facility] ORS;  Service: General;  Laterality: Right;  . TONSILLECTOMY     Family History:  Family History  Problem Relation Age of Onset  . Heart attack Father   . Diabetes Mother    Family Psychiatric  History: None known  Social History:  Social History   Substance and Sexual Activity  Alcohol Use No     Social History   Substance and Sexual Activity  Drug Use No    Social History   Socioeconomic History  . Marital status: Single    Spouse name: Not on file  . Number of children: Not on file  . Years of education: Not on file  . Highest education level: Not on file  Occupational History  . Occupation: Disabled  Social Needs  . Financial resource strain: Not on file  . Food insecurity:    Worry: Not on file    Inability: Not on file  . Transportation needs:    Medical: Not on file    Non-medical: Not on file  Tobacco Use  . Smoking status: Former Games developer  . Smokeless tobacco: Never Used  Substance and Sexual Activity  . Alcohol use: No  . Drug use: No  . Sexual activity: Not Currently  Lifestyle  . Physical activity:    Days per week: Not on file    Minutes per session: Not on file  . Stress: Not on file  Relationships  . Social connections:    Talks on phone: Not on file    Gets together: Not on file    Attends religious service: Not on file    Active member of club or organization: Not on file    Attends meetings of clubs or organizations: Not on file    Relationship status: Not on file  Other Topics Concern  . Not on file  Social History Narrative   Pt lives with mother, nephew, sister in law   Additional  Social History:       Patient is essentially homeless at this time, as she previously was residing in a group home which had provided her 30-day notice. Her 30-day notice is completed.                  Sleep: Good  Appetite:  Good  Current Medications: Current Facility-Administered Medications  Medication Dose Route Frequency Provider Last Rate Last Dose  . benztropine (COGENTIN) tablet 2 mg  2 mg Oral BID Reggie Pile, MD   2 mg at 08/14/18 2110  . clonazePAM (KLONOPIN) tablet 1 mg  1 mg Oral BID Reggie Pile, MD   1 mg at 08/14/18 2110  . diphenhydrAMINE (BENADRYL) capsule 50 mg  50 mg Oral Q8H PRN Mariel Craft, MD   50 mg at 08/14/18 2118   Or  . diphenhydrAMINE (BENADRYL) injection 50 mg  50 mg Intramuscular Q8H PRN Mariel Craft, MD   50 mg at 08/14/18 1042  . divalproex (DEPAKOTE ER) 24 hr tablet 1,000 mg  1,000 mg Oral QHS Reggie Pile, MD   1,000 mg at 08/14/18 2112  . famotidine (PEPCID) tablet 20 mg  20 mg Oral BID Reggie Pile, MD   20 mg at 08/14/18 2111  . fenofibrate tablet 160 mg  160 mg Oral Daily Reggie Pile, MD   160 mg at 08/13/18 0925  . fluvoxaMINE (LUVOX) tablet 150 mg  150 mg Oral QHS Mariel Craft, MD   150 mg at 08/14/18 2111  . haloperidol (HALDOL) tablet 5 mg  5 mg Oral Q8H PRN Mariel Craft, MD       Or  . haloperidol lactate (HALDOL) injection 5 mg  5 mg Intramuscular Q8H PRN Mariel Craft, MD   5 mg at 08/14/18 1041  . levothyroxine (SYNTHROID) tablet 50 mcg  50 mcg Oral Q0600 Reggie Pile, MD   50 mcg at 08/14/18 0516  . LORazepam (ATIVAN) tablet 2 mg  2 mg Oral Q8H PRN Mariel Craft, MD       Or  . LORazepam (ATIVAN) injection 2 mg  2 mg Intramuscular Q8H PRN Mariel Craft, MD   2 mg at 08/14/18 1041  . nitrofurantoin (MACRODANTIN) capsule 100 mg  100 mg Oral BID Reggie Pile, MD   100 mg at 08/14/18 2113  . nystatin (MYCOSTATIN/NYSTOP) topical powder   Topical BID Emily Filbert, MD   1 Bottle at 08/14/18 2113  .  OLANZapine (ZYPREXA) tablet 2.5 mg  2.5 mg Oral BID WC Mariel Craft, MD      . OLANZapine [facility]) tablet 5 mg  5 mg Oral Q8H PRN Reggie Pile, MD   5 mg at 08/10/18 1644  . pantoprazole (PROTONIX) EC tablet 40 mg  40 mg Oral Daily  Reggie Pile, MD   40 mg at 08/13/18 4917  . sucralfate (CARAFATE) tablet 1 g  1 g Oral TID WC & HS Reggie Pile, MD   1 g at 08/14/18 2111   Current Outpatient Medications  Medication Sig Dispense Refill  . ARIPiprazole (ABILIFY) 20 MG tablet Take 1 tablet (20 mg total) by mouth daily. 30 tablet 0  . benztropine (COGENTIN) 2 MG tablet Take 1 tablet (2 mg total) by mouth 2 (two) times daily. 60 tablet 0  . clonazePAM (KLONOPIN) 1 MG tablet Take 1 tablet (1 mg total) by mouth 2 (two) times daily. 30 tablet 0  . divalproex (DEPAKOTE ER) 500 MG 24 hr tablet Take 2 tablets (1,000 mg total) by mouth at bedtime. 60 tablet 0  . famotidine (PEPCID) 20 MG tablet Take 1 tablet (20 mg total) by mouth 2 (two) times daily. 60 tablet 0  . fenofibrate 160 MG tablet Take 1 tablet (160 mg total) by mouth daily. 30 tablet 1  . fluvoxaMINE (LUVOX) 100 MG tablet Take 1 tablet (100 mg total) by mouth at bedtime. 30 tablet 0  . levothyroxine (SYNTHROID) 50 MCG tablet Take 1 tablet (50 mcg total) by mouth daily before breakfast. 30 tablet 1  . nitrofurantoin (MACRODANTIN) 100 MG capsule Take 1 capsule (100 mg total) by mouth 2 (two) times daily. 60 capsule 0  . OLANZapine (ZYPREXA) 5 MG tablet Take 1 tablet (5 mg total) by mouth every 8 (eight) hours as needed (agitation). 30 tablet 0  . pantoprazole (PROTONIX) 40 MG tablet Take 1 tablet (40 mg total) by mouth daily. 30 tablet 1  . sucralfate (CARAFATE) 1 g tablet Take 1 tablet (1 g total) by mouth 4 (four) times daily -  with meals and at bedtime. 120 tablet 1    Lab Results: No results found for this or any previous visit (from the past 48 hour(s)).  Blood Alcohol level:  Lab Results  Component Value Date   ETH <10 08/09/2018    ETH <10 08/01/2018    Metabolic Disorder Labs: Lab Results  Component Value Date   HGBA1C 4.8 08/11/2016   MPG 91 08/11/2016   Lab Results  Component Value Date   PROLACTIN 111.7 (H) 08/11/2016   PROLACTIN 66.8 (H) 06/17/2015   Lab Results  Component Value Date   CHOL 178 08/11/2016   TRIG 402 (H) 08/11/2016   HDL 13 (L) 08/11/2016   CHOLHDL 13.7 08/11/2016   VLDL UNABLE TO CALCULATE IF TRIGLYCERIDE OVER 400 mg/dL 91/50/5697   LDLCALC UNABLE TO CALCULATE IF TRIGLYCERIDE OVER 400 mg/dL 94/80/1655   LDLCALC 82 06/17/2015    Physical Findings: AIMS:  , ,  ,  ,    CIWA:    COWS:     Musculoskeletal: Strength & Muscle Tone: within normal limits Gait & Station: normal Patient leans: N/A  Psychiatric Specialty Exam: Physical Exam  Nursing note and vitals reviewed. Constitutional: She appears well-developed and well-nourished. No distress.  HENT:  Head: Normocephalic and atraumatic.  Eyes: EOM are normal.  Neck: Normal range of motion.  Cardiovascular: Normal rate and regular rhythm.  Respiratory: Effort normal. No respiratory distress.  Musculoskeletal: Normal range of motion.  Neurological: She is alert.  Psychiatric: Her speech is normal. Her affect is angry, labile and inappropriate. She is agitated, aggressive and actively hallucinating. Cognition and memory are impaired. She expresses impulsivity and inappropriate judgment. She expresses suicidal ideation. She expresses no homicidal ideation. She expresses no suicidal plans. She is inattentive.  Review of Systems  Constitutional: Positive for malaise/fatigue.  Respiratory: Negative.   Cardiovascular: Negative.   Gastrointestinal: Negative.   Genitourinary: Positive for frequency and urgency.       Incontinence  Musculoskeletal: Positive for back pain and myalgias.  Neurological: Negative.   Psychiatric/Behavioral: Positive for depression. Negative for hallucinations, memory loss, substance abuse and suicidal  ideas. The patient is nervous/anxious. The patient does not have insomnia.     Blood pressure 127/86, pulse 70, temperature 97.7 F (36.5 C), temperature source Oral, resp. rate 16, last menstrual period 05/28/2018, SpO2 97 %.There is no height or weight on file to calculate BMI.  General Appearance: Disheveled  Eye Contact:  Good  Speech:  Clear and Coherent  Volume:  Normal  Mood:  Dysphoric  Affect:  Blunt  Thought Process:  Descriptions of Associations: Tangential  Orientation:  Full (Time, Place, and Person)  Thought Content:  Hallucinations: None, Rumination and Tangential  Suicidal Thoughts:  No  Homicidal Thoughts:  Denies  Memory:  Fair  Judgement:  Poor  Insight:  Shallow  Psychomotor Activity:  Psychomotor Retardation  Concentration:  Concentration: Fair  Recall:  [employer]is[employer]Fair  Fund of Knowledge:  Fair  Language:  Good  Akathisia:  No  Handed:  Right  AIMS (if indicated):     Assets:  Financial Resources/Insurance  ADL's:  Impaired  Cognition:  Impaired,  Mild  Sleep:   Adequate     Treatment Plan Summary: Demographic Factors:  Caucasian  Loss Factors: Housing, family contact  Historical Factors: Impulsivity  Risk Reduction Factors:   Sense of responsibility, Living with another person, Positive social support and Positive therapeutic relationship  Continued Clinical Symptoms:  Anxiety, mild  Cognitive Features That Contribute To Risk:  Mild intellectual disability  Suicide Risk:  Minimal: Chronic passive suicidal ideation.  Patients presenting with no risk factors but with morbid ruminations; may be classified as minimal risk based on the severity of the depressive symptoms  Plan Of Care/Follow-up recommendations:  Daily contact with patient to assess and evaluate symptoms and progress in treatment and Medication management   Adjustment disorder with mixed disturbance of emotions and conduct: Schizoaffective disorder, depressive disorder: Patient  agitated and aggressive today with refusal of medication. Klonopin 1 mg twice a day breakfast and dinner for anxiety-refused this morning's dose Depakote ER 1000 mg at bedtime for mood stabilization Luvox 150 mg at bedtime for anxiety and depressive disorder Zyprexa 2.5 mg twice a day with morning and evening meal for mood stabilization  Asthma: -albuterol 2 puffs every hours Activity:  as tolerated Diet:  heart healthy diet   Other medical: Continue Macrobid 100 mg twice daily for total of 7 days then transition to Macrodantin 100 mg daily for UTI prevention on Thursday, 08/15/2018. Synthroid 50 mcg daily for hypothyroidism Protonix, Pepcid, Carafate for GERD Fenofibrate for hypertriglyceridemia Nystatin powder for fungal/yeast infection prevention  Start Cogentin 2 mg twice daily for EPS prevention, and to maximize anticholinergic effect for assistance in decreasing urinary incontinence.  We will encourage patient to make urological/gynecology appointment after discharge.  Disposition: Continue working with her ACT team/Social work for placement in another group home setting, as patient is unlikely to be successful living independently.  Patient is currently staying in emergency department to receive assistance from social work on housing.  Patient is made aware that she will need to have insured medication compliance before discharge.   Mariel CraftSHEILA M MAURER, MD 08/15/2018, 9:37 AM

## 2018-08-15 NOTE — ED Provider Notes (Signed)
-----------------------------------------   7:18 AM on 08/15/2018 -----------------------------------------   BP 127/86 (BP Location: Left Arm)   Pulse 70   Temp 97.7 F (36.5 C) (Oral)   Resp 16   LMP 05/28/2018 (LMP Unknown)   SpO2 97%   No acute events overnight. Vitals reviewed. Patient remains medically cleared.  Disposition is pending per Psychiatry/Behavioral Medicine team recommendations.    Jene Every, MD 08/15/18 [phone]

## 2018-08-15 NOTE — ED Notes (Signed)
BEHAVIORAL HEALTH ROUNDING Patient sleeping: No. Patient alert : yes Behavior appropriate: Yes.  ; If no, describe:  Nutrition and fluids offered: yes Toileting and hygiene offered: Yes  Sitter present: q15 minute observations and security monitoring Law enforcement present: Yes  ODS  

## 2018-08-15 NOTE — ED Notes (Signed)
BEHAVIORAL HEALTH ROUNDING Patient sleeping: Yes.   Patient alert and oriented: eyes closed  Appears to be asleep Behavior appropriate: Yes.  ; If no, describe:  Nutrition and fluids offered: Yes  Toileting and hygiene offered: sleeping Sitter present: q 15 minute observations and security monitoring Law enforcement present: yes  ODS 

## 2018-08-15 NOTE — ED Notes (Signed)
Patient observed lying in bed with eyes closed  Even, unlabored respirations observed   NAD pt appears to be sleeping  I will continue to monitor along with every 15 minute visual observations and ongoing security monitoring    

## 2018-08-15 NOTE — ED Notes (Signed)
Patient anxious asking to call 911.

## 2018-08-15 NOTE — ED Notes (Signed)
Hourly rounding reveals patient in room. No complaints, stable, in no acute distress. Q15 minute rounds and monitoring via Security Cameras to continue.Snack and beverage given. 

## 2018-08-15 NOTE — ED Notes (Signed)
Report to include Situation, Background, Assessment, and Recommendations received from [facility] RN. Patient alert and oriented, warm and dry, in no acute distress. Patient denies SI, HI, VH and pain. Patient states she hears voices telling her to hurt herself. Patient made aware of Q15 minute rounds and Psychologist, counselling presence for their safety. Patient instructed to come to me with needs or concerns.

## 2018-08-15 NOTE — ED Notes (Signed)
Hygiene performed  - bed cleansed and linens changed

## 2018-08-16 ENCOUNTER — Emergency Department: Payer: Medicare Other

## 2018-08-16 DIAGNOSIS — F4325 Adjustment disorder with mixed disturbance of emotions and conduct: Secondary | ICD-10-CM | POA: Diagnosis not present

## 2018-08-16 DIAGNOSIS — R4189 Other symptoms and signs involving cognitive functions and awareness: Secondary | ICD-10-CM | POA: Diagnosis not present

## 2018-08-16 NOTE — ED Notes (Signed)
Pt came out of the bathroom with wine colored scrubs on.  Pt attempting to put yellow colored fall socks on at this time.

## 2018-08-16 NOTE — ED Provider Notes (Signed)
Patient going to the bathroom and fell.  She did not hit her head she does not have any head pain or neck ache.  She is walking and talking normally now with no problems.   Arnaldo Natal, MD 08/16/18 1352

## 2018-08-16 NOTE — ED Notes (Signed)
Security officer Allyson Sabal witnessed patient fall on buttock while walking unassisted from bathroom.  Vitals WNL.  Dr. Mont Dutton notified.  Charge nurse notified.  No injuries notified.  Post fall huddle completed.

## 2018-08-16 NOTE — ED Notes (Signed)
Pt urinated in bed.  Bed soiled with urine.  Staff changed bed.  Staff told patient that she needed to take a shower.  Pt is in the shower at this time.  Staff changed patient's bed.

## 2018-08-16 NOTE — ED Notes (Signed)
Pt provided meal tray

## 2018-08-16 NOTE — ED Notes (Signed)
Hourly rounding reveals patient in room. Stable, in no acute distress. Q15 minute rounds and monitoring via Rover and Officer to continue.  

## 2018-08-16 NOTE — ED Provider Notes (Signed)
-----------------------------------------   5:57 AM on 08/16/2018 -----------------------------------------   Blood pressure 135/84, pulse 72, temperature 98 F (36.7 C), temperature source Oral, resp. rate 15, last menstrual period 05/28/2018, SpO2 98 %.  The patient is sleeping at this time.  There have been no acute events since the last update.  Awaiting disposition plan from Behavioral Medicine team.   Irean Hong, MD 08/16/18 [phone]

## 2018-08-16 NOTE — ED Notes (Signed)
Hourly rounding reveals patient sleeping in room. No complaints, stable, in no acute distress. Q15 minute rounds and monitoring via Rover and Officer to continue.  

## 2018-08-16 NOTE — ED Notes (Signed)
Patient still at this time.

## 2018-08-16 NOTE — ED Notes (Signed)
Pt sitting in bed eating at this time.  

## 2018-08-16 NOTE — ED Notes (Signed)
Pt provided chocolate milk and [place] sandwich tray

## 2018-08-16 NOTE — ED Notes (Signed)
Pt in bathroom stating she needs assistance getting her pants on.  Yelling at tech stating, "Bitch, it's your job...do it again!"  Pt instructed to not speak to tech that way and that staff was assisting to her and prompting her to dress herself with minimal assistance.

## 2018-08-16 NOTE — ED Notes (Signed)
Pt sleeping. Meal tray placed at bedside. 

## 2018-08-16 NOTE — ED Notes (Addendum)
Pt complaining of shortness of breath.  Vitals signs taken.  T-98.0, P-85, R-18, BP-117/51, O2 Sat-97% on RA.   Dr. Fanny Bien notified.  Dr. Fanny Bien in to see patient. CT scan ordered.

## 2018-08-16 NOTE — ED Notes (Signed)
Went in to check on patient she says she's fine .
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
## 2018-08-16 NOTE — ED Notes (Addendum)
Pt given shower. Pt repeating, "Fuck you" and calling this tech, "Bitch." Pt verbally abusive but not making any attempt to be physical. After much coaching pt showered and shampooed hair. Pt also brushed teeth while showering. Pt given antiperspirant to put on. Pt given clean scrubs to put on. Will continue to monitor.

## 2018-08-16 NOTE — ED Notes (Signed)
Hourly rounding reveals patient in room. No complaints, stable, in no acute distress. Q15 minute rounds and monitoring via Rover and Officer to continue.   

## 2018-08-16 NOTE — ED Notes (Signed)
Patient stated she need help!  Went in to helped patient get pants on then  patient  took them off saying bitch it's your job do it again!  Ask patient to be kind .

## 2018-08-16 NOTE — TOC Progression Note (Signed)
Transition of Care [facility]) - Progression Note    Patient Details  Name: [name] MRN: [id number] Date of Birth: [birth year]  [facility] CM/SW Contact  Darleene Cleaver, [place] Phone Number: 08/16/2018, 2:57 PM  Clinical Narrative:     CSW received phone call from [facility], Larena Sox [phone]) who said she was still looking for placement for patient.  She asked if CSW has heard from DSS, CSW confirmed with her that CSW has not spoken to DSS.  CSW provided phone number for Myriam Forehand, guardianship supervisor.  CSW awaiting to hear back from Larena Sox regarding placement.      Expected Discharge Plan and Services   Plan for patient to eventually go to [facility] Group home once bed and funding is available.                Social Determinants of Health (SDOH) Interventions    Readmission Risk Interventions No flowsheet data found.

## 2018-08-16 NOTE — Progress Notes (Signed)
[facility] MD Progress Note  08/16/2018 7:52 AM [name]  MRN:  [id number] Subjective:  Unable to care for self Principal Problem: Adjustment disorder with mixed disturbance of emotions and conduct Diagnosis: Principal Problem:   Adjustment disorder with mixed disturbance of emotions and conduct Active Problems:   Schizoaffective disorder, bipolar type (HCC)   Cerebral palsy (HCC)   Mild intellectual disability   GERD (gastroesophageal reflux disease)   COPD (chronic obstructive pulmonary disease) (HCC)   Suicidal ideation  Patient is seen, chart is reviewed Total Time spent with patient: 15 minutes   HPI:   On admission 08/08/18:  [name] is a 53 year old Caucasian female with a history of schizoaffective disorder, intellectual disability and borderline personality disorder.  She was seen in emergency department due to suicidal ideations, but once evaluated, she denied having them, and therefore she was discharged. Later on the same day, she came back expressing suicidal thoughts again, and racing thoughts.    Past Psychiatric History: Schizoaffective disorder, mild intellectual disability, multiple suicide attempts.  On evaluation, patient was working on completing her ADLs. Patient is calm and cooperative with some anxiety at times.  She reports that she has been having some back pain, low level.  No suicidal/homicidal ideations, hallucinations, or substance abuse.  Chronically suicidal at times especially upon arriving to the ED or when she does not want to be at her current living situation, group homes or her family  Frequently comes to the ED when she does not get what she wants.  Caveat:  Cognitive impairment with low level of frustration.  Awaiting for group home placement.  Past Medical History:  Past Medical History:  Diagnosis Date  . Anxiety   . Asthma   . Borderline personality disorder (HCC)   . Cerebral palsy (HCC)   . COPD (chronic obstructive pulmonary disease) (HCC)    . Depression   . GERD (gastroesophageal reflux disease)   . Mild cognitive impairment   . Schizoaffective disorder (HCC)   . Wound abscess     Past Surgical History:  Procedure Laterality Date  . CHOLECYSTECTOMY    . INCISION AND DRAINAGE ABSCESS Right 01/02/2015   Procedure: INCISION AND DRAINAGE ABSCESS;  Surgeon: Tiney Rouge III, MD;  Location: [facility] ORS;  Service: General;  Laterality: Right;  . TONSILLECTOMY     Family History:  Family History  Problem Relation Age of Onset  . Heart attack Father   . Diabetes Mother    Family Psychiatric  History: None known  Social History:  Social History   Substance and Sexual Activity  Alcohol Use No     Social History   Substance and Sexual Activity  Drug Use No    Social History   Socioeconomic History  . Marital status: Single    Spouse name: Not on file  . Number of children: Not on file  . Years of education: Not on file  . Highest education level: Not on file  Occupational History  . Occupation: Disabled  Social Needs  . Financial resource strain: Not on file  . Food insecurity:    Worry: Not on file    Inability: Not on file  . Transportation needs:    Medical: Not on file    Non-medical: Not on file  Tobacco Use  . Smoking status: Former Games developer  . Smokeless tobacco: Never Used  Substance and Sexual Activity  . Alcohol use: No  . Drug use: No  . Sexual activity: Not Currently  Lifestyle  .  Physical activity:    Days per week: Not on file    Minutes per session: Not on file  . Stress: Not on file  Relationships  . Social connections:    Talks on phone: Not on file    Gets together: Not on file    Attends religious service: Not on file    Active member of club or organization: Not on file    Attends meetings of clubs or organizations: Not on file    Relationship status: Not on file  Other Topics Concern  . Not on file  Social History Narrative   Pt lives with mother, nephew, sister in law    Additional Social History:       Patient is essentially homeless at this time, as she previously was residing in a group home which had provided her 30-day notice. Her 30-day notice is completed.     Sleep: Good  Appetite:  Good  Current Medications: Current Facility-Administered Medications  Medication Dose Route Frequency Provider Last Rate Last Dose  . benztropine (COGENTIN) tablet 2 mg  2 mg Oral BID Reggie Pile, MD   2 mg at 08/15/18 2126  . clonazePAM (KLONOPIN) tablet 1 mg  1 mg Oral BID Reggie Pile, MD   1 mg at 08/15/18 2126  . diphenhydrAMINE (BENADRYL) capsule 50 mg  50 mg Oral Q8H PRN Mariel Craft, MD   50 mg at 08/14/18 2118   Or  . diphenhydrAMINE (BENADRYL) injection 50 mg  50 mg Intramuscular Q8H PRN Mariel Craft, MD   50 mg at 08/14/18 1042  . divalproex (DEPAKOTE ER) 24 hr tablet 1,000 mg  1,000 mg Oral QHS Reggie Pile, MD   1,000 mg at 08/15/18 2126  . famotidine (PEPCID) tablet 20 mg  20 mg Oral BID Reggie Pile, MD   20 mg at 08/15/18 2126  . fenofibrate tablet 160 mg  160 mg Oral Daily Reggie Pile, MD   160 mg at 08/15/18 1637  . fluvoxaMINE (LUVOX) tablet 150 mg  150 mg Oral QHS Mariel Craft, MD   150 mg at 08/15/18 2126  . haloperidol (HALDOL) tablet 5 mg  5 mg Oral Q8H PRN Mariel Craft, MD       Or  . haloperidol lactate (HALDOL) injection 5 mg  5 mg Intramuscular Q8H PRN Mariel Craft, MD   5 mg at 08/14/18 1041  . levothyroxine (SYNTHROID) tablet 50 mcg  50 mcg Oral Q0600 Reggie Pile, MD   50 mcg at 08/16/18 0556  . LORazepam (ATIVAN) tablet 2 mg  2 mg Oral Q8H PRN Mariel Craft, MD       Or  . LORazepam (ATIVAN) injection 2 mg  2 mg Intramuscular Q8H PRN Mariel Craft, MD   2 mg at 08/14/18 1041  . nitrofurantoin (MACRODANTIN) capsule 100 mg  100 mg Oral BID Reggie Pile, MD   100 mg at 08/15/18 2126  . nystatin (MYCOSTATIN/NYSTOP) topical powder   Topical BID Emily Filbert, MD      . OLANZapine [facility]) tablet 2.5  mg  2.5 mg Oral BID WC Mariel Craft, MD   2.5 mg at 08/16/18 0734  . OLANZapine (ZYPREXA) tablet 5 mg  5 mg Oral Q8H PRN Reggie Pile, MD   5 mg at 08/15/18 2234  . pantoprazole (PROTONIX) EC tablet 40 mg  40 mg Oral Daily Reggie Pile, MD   40 mg at 08/15/18 1618  . sucralfate (CARAFATE) tablet 1  g  1 g Oral TID WC & HS Reggie Pile, MD   1 g at 08/16/18 9678   Current Outpatient Medications  Medication Sig Dispense Refill  . ARIPiprazole (ABILIFY) 20 MG tablet Take 1 tablet (20 mg total) by mouth daily. 30 tablet 0  . benztropine (COGENTIN) 2 MG tablet Take 1 tablet (2 mg total) by mouth 2 (two) times daily. 60 tablet 0  . clonazePAM (KLONOPIN) 1 MG tablet Take 1 tablet (1 mg total) by mouth 2 (two) times daily. 30 tablet 0  . divalproex (DEPAKOTE ER) 500 MG 24 hr tablet Take 2 tablets (1,000 mg total) by mouth at bedtime. 60 tablet 0  . famotidine (PEPCID) 20 MG tablet Take 1 tablet (20 mg total) by mouth 2 (two) times daily. 60 tablet 0  . fenofibrate 160 MG tablet Take 1 tablet (160 mg total) by mouth daily. 30 tablet 1  . fluvoxaMINE (LUVOX) 100 MG tablet Take 1 tablet (100 mg total) by mouth at bedtime. 30 tablet 0  . levothyroxine (SYNTHROID) 50 MCG tablet Take 1 tablet (50 mcg total) by mouth daily before breakfast. 30 tablet 1  . nitrofurantoin (MACRODANTIN) 100 MG capsule Take 1 capsule (100 mg total) by mouth 2 (two) times daily. 60 capsule 0  . OLANZapine (ZYPREXA) 5 MG tablet Take 1 tablet (5 mg total) by mouth every 8 (eight) hours as needed (agitation). 30 tablet 0  . pantoprazole (PROTONIX) 40 MG tablet Take 1 tablet (40 mg total) by mouth daily. 30 tablet 1  . sucralfate (CARAFATE) 1 g tablet Take 1 tablet (1 g total) by mouth 4 (four) times daily -  with meals and at bedtime. 120 tablet 1    Lab Results:  Results for orders placed or performed during the hospital encounter of 08/09/18 (from the past 48 hour(s))  Valproic acid level     Status: Abnormal   Collection  Time: 08/15/18  5:08 PM  Result Value Ref Range   Valproic Acid Lvl 43 (L) 50.0 - 100.0 ug/mL    Comment: Performed at [facility], [address] [place], [place] [address]    Blood Alcohol level:  Lab Results  Component Value Date   [facility] <10 08/09/2018   ETH <10 08/01/2018    Metabolic Disorder Labs: Lab Results  Component Value Date   HGBA1C 4.8 08/11/2016   MPG 91 08/11/2016   Lab Results  Component Value Date   PROLACTIN 111.7 (H) 08/11/2016   PROLACTIN 66.8 (H) 06/17/2015   Lab Results  Component Value Date   CHOL 178 08/11/2016   TRIG 402 (H) 08/11/2016   HDL 13 (L) 08/11/2016   CHOLHDL 13.7 08/11/2016   VLDL UNABLE TO CALCULATE IF TRIGLYCERIDE OVER 400 mg/dL 17/51/0258   LDLCALC UNABLE TO CALCULATE IF TRIGLYCERIDE OVER 400 mg/dL 52/77/8242   LDLCALC 82 06/17/2015    Musculoskeletal: Strength & Muscle Tone: within normal limits Gait & Station: normal Patient leans: N/A  Psychiatric Specialty Exam: Physical Exam  Nursing note and vitals reviewed. Constitutional: She appears well-developed and well-nourished. No distress.  HENT:  Head: Normocephalic and atraumatic.  Eyes: EOM are normal.  Neck: Normal range of motion.  Cardiovascular: Normal rate and regular rhythm.  Respiratory: Effort normal. No respiratory distress.  Musculoskeletal: Normal range of motion.  Neurological: She is alert.  Psychiatric: Her speech is normal and behavior is normal. Thought content normal. Her affect is labile. Cognition and memory are impaired. She expresses impulsivity. She expresses no homicidal ideation.  She expresses no suicidal plans.    Review of Systems  Constitutional: Positive for malaise/fatigue.  Respiratory: Negative.   Cardiovascular: Negative.   Gastrointestinal: Negative.   Genitourinary: Positive for frequency and urgency.       Incontinence  Musculoskeletal: Positive for back pain and myalgias.  Neurological: Negative.    Psychiatric/Behavioral: Positive for depression. Negative for hallucinations, memory loss, substance abuse and suicidal ideas. The patient is nervous/anxious. The patient does not have insomnia.     Blood pressure 135/84, pulse 72, temperature 98 F (36.7 C), temperature source Oral, resp. rate 15, last menstrual period 05/28/2018, SpO2 98 %.There is no height or weight on file to calculate BMI.  General Appearance: Disheveled  Eye Contact:  Good  Speech:  Clear and Coherent  Volume:  Normal  Mood:  Dysphoric  Affect:  Blunt  Thought Process:  Coherent  Orientation:  Full (Time, Place, and Person)  Thought Content:  Logical   Suicidal Thoughts:  No  Homicidal Thoughts:  Denies  Memory:  Fair  Judgement:  Fair  Insight:  Shallow  Psychomotor Activity:  Psychomotor Retardation  Concentration:  Concentration: Fair  Recall:  Fair  Fund of Knowledge:  Fair  Language:  Good  Akathisia:  No  Handed:  Right  AIMS (if indicated):     Assets:  Financial Resources/Insurance  ADL's:  Impaired  Cognition:  Impaired,  Mild  Sleep:   Adequate    Treatment Plan Summary: Plan Of Care/Follow-up recommendations:  Daily contact with patient to assess and evaluate symptoms and progress in treatment and Medication management   Adjustment disorder with mixed disturbance of emotions and conduct: Schizoaffective disorder, depressive disorder: Patient agitated and aggressive today with refusal of medication. -Continue Depakote ER 1000 mg at bedtime for mood stabilization -Continue Luvox 150 mg at bedtime for anxiety and depressive disorder -Continue Zyprexa 2.5 mg twice a day with morning and evening meal for mood stabilization  Agitation: -Continue Zyprexa 5 mg every 8 hours PRN agitation -Continue Benadryl 50 mg every 8 hours PRN agitation -Discontinued Haldol PRN and Ativan PRN  Anxiety: -ContinueKlonopin 1 mg twice a day breakfast and dinner for anxiety  Asthma: -Continue albuterol 2  puffs every hours  EPS prevention: -Continue Cogentin 2 mg twice daily for EPS prevention, and to maximize anticholinergic effect for assistance in decreasing urinary incontinence.  UTI prevention: -Continue Macrobid 100 mg daily  Hypothyroid: -Continue Synthroid 50 mcg daily for hypothyroidism  GERD: -Continue Protonix 40 mg daily -Continue Pepcid 20 mg BID -Continue Carafate 1 gram with meals and at bedtime  Elevated triglycerides -Continue Fenofibrate 160 mg daily  Yeast infection prevention: Nystatin powder BID  We will encourage patient to make urological/gynecology appointment after discharge.  Disposition: Continue working with her ACT team/Social work for placement in another group home setting, as patient is unlikely to be successful living independently.  Patient is currently staying in emergency department to receive assistance from social work on housing.  Patient is made aware that she will need to have insured medication compliance before discharge.   Nanine MeansJamison Lord, NP 08/16/2018, 7:52 AM

## 2018-08-16 NOTE — ED Notes (Signed)
Pt. Sitting on bed in room #20.  Pt. States she fell today.  Pt. Waiting for results from CT.  Pt. States she is hungry.  Pt. Told she can have meal tray at snack time.  Pt. Calm and cooperative at this time.

## 2018-08-16 NOTE — ED Notes (Signed)
Hourly rounding reveals patient in rest room. Stable, in no acute distress. Q15 minute rounds and monitoring via Rover and Officer to continue.  

## 2018-08-16 NOTE — ED Notes (Signed)
Patient in restroom at this time.

## 2018-08-17 DIAGNOSIS — F4325 Adjustment disorder with mixed disturbance of emotions and conduct: Secondary | ICD-10-CM

## 2018-08-17 DIAGNOSIS — R4189 Other symptoms and signs involving cognitive functions and awareness: Secondary | ICD-10-CM | POA: Diagnosis not present

## 2018-08-17 MED ORDER — ACETAMINOPHEN 325 MG PO TABS
650.0000 mg | ORAL_TABLET | Freq: Once | ORAL | Status: AC
  Administered 2018-08-17: 650 mg via ORAL
  Filled 2018-08-17: qty 2

## 2018-08-17 NOTE — ED Notes (Signed)
Pt. Given meal tray and drink upon request. 

## 2018-08-17 NOTE — ED Notes (Signed)
Pt provided with meal tray and coke to drink, pt lying in bed with no other complaints or needs at this time

## 2018-08-17 NOTE — ED Notes (Signed)
Pt. Called this nurse into room, pt. Complained of HA.  Dr. Dolores Frame notified, orders given.

## 2018-08-17 NOTE — ED Notes (Signed)
Pt refused dinner tray.  Stated she didn't want it.  She just wants chocolate milk.

## 2018-08-17 NOTE — ED Notes (Signed)
Pt refusing to get up and go to the bathroom.  Telling nurse, "Find a man to help pull me up in bed."  Nurse encouraged pt to reposition herself, but pt refused.  Pt laying sideways in the bed at this time.

## 2018-08-17 NOTE — ED Notes (Signed)
Pt had soiled bed with urine.  Staff changed pt's bed.  Placed dry linen on bed.  Assisted pt with putting on clean scrubs.  Pt urinated in toilet x 2.

## 2018-08-17 NOTE — ED Notes (Signed)
Pt urinated in bed.  Staff changed bed.  Staff encouraged patient to change scrubs and underwear with minimal assistance.  Pt went into the bathroom to change.  Pt called nurse, "Bitch!"  Pt returned to her room.  Then returned to bathroom stating she had to urinate.

## 2018-08-17 NOTE — ED Notes (Signed)
Pt. In bathroom changing clothes.

## 2018-08-17 NOTE — ED Notes (Signed)
Per Dr. Darnelle Catalan  IVC papers allowed to expire 08/17/18 noon  1205

## 2018-08-17 NOTE — ED Notes (Signed)
Pt given meal tray, but pt refused it and rolled over in bed. Meal tray left at bedside.

## 2018-08-17 NOTE — ED Notes (Signed)
Pt told this EDT to throw her tray away. I asked pt why she was unable to throw her own tray away. Pt states:" I'm unable to walk at all due to my situation, and my fracture". This EDT asked pt how she was ambulating to the restroom. Pt stated again that she cant walk. This EDT attempted to encouraged pt to walk and throw her trash away since she walked without assistance earlier. Pt cursed at this EDT stating: "you Bitch, your just being a bitch. How the hell do you know if I can walk. Who the fuck are you. I dont even know you". This EDT reminded pt that it is inappropriate to curse at staff. RN made aware

## 2018-08-17 NOTE — ED Provider Notes (Signed)
-----------------------------------------   4:04 AM on 08/17/2018 -----------------------------------------   Blood pressure (!) 117/51, pulse 85, temperature 98 F (36.7 C), temperature source Oral, resp. rate 18, last menstrual period 05/28/2018, SpO2 97 %.  The patient is calm and cooperative at this time.  There have been no acute events since the last update.  Awaiting disposition plan from Behavioral Medicine team.    Willy Eddy, MD 08/17/18 [phone]

## 2018-08-17 NOTE — Consult Note (Signed)
[facility] Face-to-Face Psychiatry Consult   Reason for Consult:  Psych follow-up Referring Physician:   [name] MRN:  [id number] Principal Diagnosis: Adjustment disorder with mixed disturbance of emotions and conduct Diagnosis:  Principal Problem:   Adjustment disorder with mixed disturbance of emotions and conduct Active Problems:   Cerebral palsy (HCC)   Mild intellectual disability   GERD (gastroesophageal reflux disease)   COPD (chronic obstructive pulmonary disease) (HCC)   Schizoaffective disorder, bipolar type (HCC)   Suicidal ideation   Total Time spent with patient: 20 minutes  Subjective:   [name] is a 53 year old Caucasian female with a history ofschizoaffective disorder, intellectual disability and borderline personality disorder. She was initially seen in emergency department due to suicidal ideations, racing thoughts, insomnia.Patient is in ED for 7 days, awaiting disposition.   Patient seen today for psych f/u. Chart reviewed. Patient reported a fall yesterday and stated she hit her head. CT head done yesterday, on 08/16/18, results: "no intracranial pathology".  Today patient identifies her mood as "terrible", reports she is still having thoughts of harming self, but she feel safe from harming self in the hospital. At the same time, she is asking about discharge, she understands that her placement in in process. She reports feeling "depressed" and would not disclose reasons for feeling that way "I don`t want to talk about that". She reports no thoughts of harming others. She reports "so-so" sleep. States she is hearing "voices", but does not appear to be internally-stimulated.   Med Hx: asthma, cerebral palsy, COPD.  Past psych Hx:  Dx: Schizoaffective d/o, BPD. Multiple past psych admissions. Has h/o past suicide attempts.  Current psych medicationsZyprexa 2.5mg  PO BID, Depakote  PO QHS, Klonopin  PO BID, Luvox  PO QHS,  Benztropin  PO BID. Last VPA-level - 43 from 08/15/2018.  Social History: Patient is single. She lived in group home. She is on disability. She has no children. She reports no access to guns.  Substance use: denies.  Risk to Self:  acute - low/moderate, chronic - moderate. Risk to Others:  low Prior Inpatient Therapy:  yes Prior Outpatient Therapy:  yes  Past Medical History:  Past Medical History:  Diagnosis Date  . Anxiety   . Asthma   . Borderline personality disorder (HCC)   . Cerebral palsy (HCC)   . COPD (chronic obstructive pulmonary disease) (HCC)   . Depression   . GERD (gastroesophageal reflux disease)   . Mild cognitive impairment   . Schizoaffective disorder (HCC)   . Wound abscess     Past Surgical History:  Procedure Laterality Date  . CHOLECYSTECTOMY    . INCISION AND DRAINAGE ABSCESS Right 01/02/2015   Procedure: INCISION AND DRAINAGE ABSCESS;  Surgeon: Tiney Rouge III, MD;  Location: [facility] ORS;  Service: General;  Laterality: Right;  . TONSILLECTOMY     Family History:  Family History  Problem Relation Age of Onset  . Heart attack Father   . Diabetes Mother    Family Psychiatric  History: N/A Social History:  Social History   Substance and Sexual Activity  Alcohol Use No     Social History   Substance and Sexual Activity  Drug Use No    Social History   Socioeconomic History  . Marital status: Single    Spouse name: Not on file  . Number of children: Not on file  . Years of education: Not on file  . Highest education level: Not on file  Occupational History  .  Occupation: Disabled  Social Needs  . Financial resource strain: Not on file  . Food insecurity:    Worry: Not on file    Inability: Not on file  . Transportation needs:    Medical: Not on file    Non-medical: Not on file  Tobacco Use  . Smoking status: Former Games developer  . Smokeless tobacco: Never Used  Substance and Sexual Activity  . Alcohol use: No  . Drug use: No  .  Sexual activity: Not Currently  Lifestyle  . Physical activity:    Days per week: Not on file    Minutes per session: Not on file  . Stress: Not on file  Relationships  . Social connections:    Talks on phone: Not on file    Gets together: Not on file    Attends religious service: Not on file    Active member of club or organization: Not on file    Attends meetings of clubs or organizations: Not on file    Relationship status: Not on file  Other Topics Concern  . Not on file  Social History Narrative   Pt lives with mother, nephew, sister in law   Additional Social History:    Allergies:   Allergies  Allergen Reactions  . Penicillins Nausea And Vomiting and Other (See Comments)    Has patient had a PCN reaction causing immediate rash, facial/tongue/throat swelling, SOB or lightheadedness with hypotension: No Has patient had a PCN reaction causing severe rash involving mucus membranes or skin necrosis: No Has patient had a PCN reaction that required hospitalization No Has patient had a PCN reaction occurring within the last 10 years: No If all of the above answers are "NO", then may proceed with Cephalosporin use      Labs:  Results for orders placed or performed during the hospital encounter of 08/09/18 (from the past 48 hour(s))  Valproic acid level     Status: Abnormal   Collection Time: 08/15/18  5:08 PM  Result Value Ref Range   Valproic Acid Lvl 43 (L) 50.0 - 100.0 ug/mL    Comment: Performed at [facility], [address] [place], [place] [address]    Current Facility-Administered Medications  Medication Dose Route Frequency Provider Last Rate Last Dose  . benztropine (COGENTIN) tablet 2 mg  2 mg Oral BID Reggie Pile, MD   2 mg at 08/16/18 2142  . clonazePAM (KLONOPIN) tablet 1 mg  1 mg Oral BID Reggie Pile, MD   1 mg at 08/16/18 2141  . diphenhydrAMINE (BENADRYL) capsule 50 mg  50 mg Oral Q8H PRN Mariel Craft, MD   50 mg at 08/14/18 2118   Or   . diphenhydrAMINE (BENADRYL) injection 50 mg  50 mg Intramuscular Q8H PRN Mariel Craft, MD   50 mg at 08/14/18 1042  . divalproex (DEPAKOTE ER) 24 hr tablet 1,000 mg  1,000 mg Oral QHS Reggie Pile, MD   1,000 mg at 08/16/18 2141  . famotidine (PEPCID) tablet 20 mg  20 mg Oral BID Reggie Pile, MD   20 mg at 08/16/18 2141  . fenofibrate tablet 160 mg  160 mg Oral Daily Reggie Pile, MD   160 mg at 08/16/18 0930  . fluvoxaMINE (LUVOX) tablet 150 mg  150 mg Oral QHS Mariel Craft, MD   150 mg at 08/16/18 2143  . levothyroxine (SYNTHROID) tablet 50 mcg  50 mcg Oral Q0600 Reggie Pile, MD   50 mcg at 08/17/18 [id number]  .  nitrofurantoin (MACRODANTIN) capsule 100 mg  100 mg Oral BID Reggie PileJoshi, Anand, MD   100 mg at 08/16/18 2142  . nystatin (MYCOSTATIN/NYSTOP) topical powder   Topical BID Emily FilbertWilliams, Jonathan E, MD      . OLANZapine [facility](ZYPREXA) tablet 2.5 mg  2.5 mg Oral BID WC Mariel CraftMaurer, Sheila M, MD   2.5 mg at 08/17/18 [id number]  . OLANZapine (ZYPREXA) tablet 5 mg  5 mg Oral Q8H PRN Reggie PileJoshi, Anand, MD   5 mg at 08/15/18 2234  . pantoprazole (PROTONIX) EC tablet 40 mg  40 mg Oral Daily Reggie PileJoshi, Anand, MD   40 mg at 08/16/18 0930  . sucralfate (CARAFATE) tablet 1 g  1 g Oral TID WC & HS Reggie PileJoshi, Anand, MD   1 g at 08/17/18 [id number]   Current Outpatient Medications  Medication Sig Dispense Refill  . ARIPiprazole (ABILIFY) 20 MG tablet Take 1 tablet (20 mg total) by mouth daily. 30 tablet 0  . benztropine (COGENTIN) 2 MG tablet Take 1 tablet (2 mg total) by mouth 2 (two) times daily. 60 tablet 0  . clonazePAM (KLONOPIN) 1 MG tablet Take 1 tablet (1 mg total) by mouth 2 (two) times daily. 30 tablet 0  . divalproex (DEPAKOTE ER) 500 MG 24 hr tablet Take 2 tablets (1,000 mg total) by mouth at bedtime. 60 tablet 0  . famotidine (PEPCID) 20 MG tablet Take 1 tablet (20 mg total) by mouth 2 (two) times daily. 60 tablet 0  . fenofibrate 160 MG tablet Take 1 tablet (160 mg total) by mouth daily. 30 tablet 1  . fluvoxaMINE (LUVOX)  100 MG tablet Take 1 tablet (100 mg total) by mouth at bedtime. 30 tablet 0  . levothyroxine (SYNTHROID) 50 MCG tablet Take 1 tablet (50 mcg total) by mouth daily before breakfast. 30 tablet 1  . nitrofurantoin (MACRODANTIN) 100 MG capsule Take 1 capsule (100 mg total) by mouth 2 (two) times daily. 60 capsule 0  . OLANZapine (ZYPREXA) 5 MG tablet Take 1 tablet (5 mg total) by mouth every 8 (eight) hours as needed (agitation). 30 tablet 0  . pantoprazole (PROTONIX) 40 MG tablet Take 1 tablet (40 mg total) by mouth daily. 30 tablet 1  . sucralfate (CARAFATE) 1 g tablet Take 1 tablet (1 g total) by mouth 4 (four) times daily -  with meals and at bedtime. 120 tablet 1     Psychiatric Specialty Exam: Physical Exam  ROS  Blood pressure (!) 117/51, pulse 85, temperature 98 F (36.7 C), temperature source Oral, resp. rate 18, last menstrual period 05/28/2018, SpO2 97 %.There is no height or weight on file to calculate BMI.  General Appearance: Disheveled  Eye Contact:  Fair  Speech:  Garbled  Volume:  Decreased  Mood:  Depressed  Affect:  Constricted  Thought Process:  Coherent and Goal Directed  Orientation:  Full (Time, Place, and Person)  Thought Content:  Negative  Suicidal Thoughts:  Chronic, without intent/plan  Homicidal Thoughts:  No  Memory:  Immediate;   Fair  Judgement:  Poor  Insight:  Shallow  Psychomotor Activity:  Decreased  Concentration:  Concentration: Poor and Attention Span: Poor  Recall:  FiservFair  Fund of Knowledge:  Poor  Language:  Fair  Akathisia:  No  Handed:  Right  AIMS (if indicated):     Assets:  Desire for Improvement Social Support  ADL's:  Intact  Cognition:  Impaired,  Mild  Sleep:        Treatment Plan Summary:  Sherri  Green a 54 y.o.female with a past psych h/o Schizoaffective disorder, borderline personality disorder, IDD, who was brought to ED from group home due to suicidal ideations. The patient iswell known to the ED and often presents  with similar presentation - SI and HI in settings of not liking her group home.  Today patient appears to be without acute changes. She continues to express depressed mood and chronic suicidal ideations without particular plan. She is compliant with her medications.  CT head due to reported fall yesterday - without intracranial changes.  There are no indications for psych medication changes at this time. Current psych medications: Zyprexa 2.5mg  PO BID, Depakote  PO QHS, Klonopin  PO BID, Luvox  PO QHS, Benztropin  PO BID. Last VPA-level was 43 from 08/15/2018. Patient is currently staying in emergency department to receive assistance from social work on housing. Spoke with TTS, placement is still pending. Will assess the patient while she is in ER.    Thalia Party, MD 08/17/2018 9:29 AM

## 2018-08-17 NOTE — ED Notes (Signed)
Pt up to the bathroom unassisted at this time. 

## 2018-08-17 NOTE — ED Notes (Signed)
Pt encouraged to get up and go to the bathroom, but refuses.  Pt refuses to get up and place her breakfast tray in the trash can.

## 2018-08-17 NOTE — ED Notes (Addendum)
Pt up to the bathroom unassisted at this time.

## 2018-08-18 DIAGNOSIS — R4189 Other symptoms and signs involving cognitive functions and awareness: Secondary | ICD-10-CM | POA: Diagnosis not present

## 2018-08-18 MED ORDER — ACETAMINOPHEN 325 MG PO TABS
650.0000 mg | ORAL_TABLET | Freq: Once | ORAL | Status: DC
  Filled 2018-08-18 (×2): qty 2

## 2018-08-18 NOTE — ED Notes (Signed)
Manual blood pressure obtained 138/80 laying down in left arm.

## 2018-08-18 NOTE — ED Provider Notes (Signed)
-----------------------------------------   6:24 AM on 08/18/2018 -----------------------------------------   Blood pressure (!) 131/92, pulse 94, temperature 98.6 F (37 C), temperature source Oral, resp. rate 18, last menstrual period 05/28/2018, SpO2 96 %.  The patient is sleeping at this time.  There have been no acute events since the last update.  Awaiting disposition plan from Behavioral Medicine team.   Irean Hong, MD 08/18/18 [phone]

## 2018-08-18 NOTE — ED Notes (Signed)
Pt. Went to bathroom.  This nurse checked in on patient when patient had been in bathroom for more than 5 minutes.  Pt. Had taken off all her clothes and sitting on toilet.  Pt. Asked "Why did you take off all of your clothes?"  Pt. Responded "I got wet" Pt. Given additional scrubs and under clothes.

## 2018-08-18 NOTE — ED Notes (Signed)
Patient came out and threw her trash away, patient then stood in the hall and when asked to go back in her room. Patient said "no" and would not go back in her room, then when ED technician Gerilyn Pilgrim, came and asked her to go to her room she went and licked out her tongue to Clinical research associate.

## 2018-08-18 NOTE — ED Notes (Signed)
Lunch tray placed in room. Pt asleep.  

## 2018-08-18 NOTE — ED Notes (Signed)
Bedside commode placed in patient's room

## 2018-08-18 NOTE — ED Notes (Signed)
Patient continues to need redirection to shut her door when using the commode

## 2018-08-18 NOTE — ED Notes (Addendum)
Patient asking to be transferred to a hotel or to [facility]

## 2018-08-18 NOTE — ED Notes (Signed)
Pt given breakfast tray

## 2018-08-19 DIAGNOSIS — G809 Cerebral palsy, unspecified: Secondary | ICD-10-CM | POA: Diagnosis not present

## 2018-08-19 DIAGNOSIS — F250 Schizoaffective disorder, bipolar type: Secondary | ICD-10-CM | POA: Diagnosis not present

## 2018-08-19 DIAGNOSIS — F4325 Adjustment disorder with mixed disturbance of emotions and conduct: Secondary | ICD-10-CM | POA: Diagnosis not present

## 2018-08-19 DIAGNOSIS — F70 Mild intellectual disabilities: Secondary | ICD-10-CM | POA: Diagnosis not present

## 2018-08-19 DIAGNOSIS — R4189 Other symptoms and signs involving cognitive functions and awareness: Secondary | ICD-10-CM | POA: Diagnosis not present

## 2018-08-19 MED ORDER — ACETAMINOPHEN 325 MG PO TABS
650.0000 mg | ORAL_TABLET | Freq: Four times a day (QID) | ORAL | Status: DC | PRN
  Administered 2018-08-19 – 2018-09-14 (×14): 650 mg via ORAL
  Filled 2018-08-19 (×14): qty 2

## 2018-08-19 NOTE — ED Notes (Signed)
Pt awake and alert. Pt ambulates to bathroom

## 2018-08-19 NOTE — ED Notes (Signed)
Pt took meds .  Pt calm and cooperative

## 2018-08-19 NOTE — ED Notes (Signed)
Resumed care from Theus Espin t. Rn.  Pt sleeping

## 2018-08-19 NOTE — ED Provider Notes (Signed)
-----------------------------------------   5:55 AM on 08/19/2018 -----------------------------------------   Blood pressure (!) 131/92, pulse 91, temperature 97.8 F (36.6 C), temperature source Oral, resp. rate 20, last menstrual period 05/28/2018, SpO2 96 %.  The patient is calm and cooperative at this time.  There have been no acute events since the last update.  Awaiting disposition plan from Behavioral Medicine team.   Dionne Bucy, MD 08/19/18 [phone]

## 2018-08-19 NOTE — ED Notes (Signed)
Pt ambulated to the restroom at this time with no assistance

## 2018-08-19 NOTE — ED Notes (Signed)
Pt walked to the restroom with no assistance  

## 2018-08-19 NOTE — ED Notes (Signed)
BEHAVIORAL HEALTH ROUNDING Patient sleeping: Yes.   Patient alert and oriented: eyes closed  Appears to be asleep Behavior appropriate: Yes.  ; If no, describe:  Nutrition and fluids offered: Yes  Toileting and hygiene offered: sleeping Sitter present: q 15 minute observations and security monitoring Law enforcement present: yes  ODS 

## 2018-08-19 NOTE — ED Notes (Signed)
Pt states " I will hit you if you touch me". Wiley, ODS attempting to talk to pt at this time.

## 2018-08-19 NOTE — ED Notes (Addendum)
Pt's bed soiled with urine.   Linen and changed and new scrubs put on patient.    Pt eating dinner tray.

## 2018-08-19 NOTE — ED Notes (Signed)
Pt requesting coke and this tech told pt that it was too late for coke that I could provide water. Pt states she doesn't want water she only wants coke. Repeatedly told pt she was not allowed coke at this time of night. Pt states that this Clinical research associate is a "bitch and whore" multiple times. Pt is out in the hallway at this time. Told pt she had to get back in her rm and pt states she will not go back in there and "you wont do nothing about it cause you're scared to get in trouble. Pt attempting to take bracelet off stating "I want to take it off cause I want to get transferred to [facility]". Explained to pt that it was too late to be transferred tonight. She continues to stand in hallway and refuses to get back in rm at this time. Amy C., RN notified at this time.

## 2018-08-19 NOTE — ED Notes (Signed)
Pt in the hallway asking for this tech to call the sheriffs department to come pick her up and take her to [facility]. Told pt to return to her rm and pt refuses to do so.

## 2018-08-19 NOTE — Progress Notes (Signed)
[facility] MD Progress Note  08/19/2018 11:27 AM [name]  MRN:  [id number] Subjective:  "I'm having a little suicide thoughts". Principal Problem: Adjustment disorder with mixed disturbance of emotions and conduct Diagnosis: Principal Problem:   Adjustment disorder with mixed disturbance of emotions and conduct Active Problems:   Cerebral palsy (HCC)   Mild intellectual disability   GERD (gastroesophageal reflux disease)   COPD (chronic obstructive pulmonary disease) (HCC)   Schizoaffective disorder, bipolar type (HCC)   Suicidal ideation  Patient is seen, chart is reviewed Total Time spent with patient: 15 minutes   HPI:   On admission 08/08/18:  [name] is a 53 year old Caucasian female with a history of schizoaffective disorder, intellectual disability and borderline personality disorder.  She was seen in emergency department due to suicidal ideations, but once evaluated, she denied having them, and therefore she was discharged. Later on the same day, she came back expressing suicidal thoughts again, and racing thoughts.    Past Psychiatric History: Schizoaffective disorder, mild intellectual disability, multiple suicide attempts.  On evaluation, patient has been sleeping the majority of the morning, and states that she is bored.  She rouses easily and denies physical complaints.  She reports some depression and suicidal thoughts without a plan.  She denies HI, AVH today.  Patient is calm and cooperative with some anxiety at times.  She has had no behavioral issues today. Historically, she is chronically suicidal at times especially upon arriving to the ED or when she does not want to be at her current living situation, group homes or her family  Frequently comes to the ED when she does not get what she wants.   Caveat:  Cognitive impairment with low level of frustration.   Awaiting for group home placement.  Past Medical History:  Past Medical History:  Diagnosis Date  . Anxiety    . Asthma   . Borderline personality disorder (HCC)   . Cerebral palsy (HCC)   . COPD (chronic obstructive pulmonary disease) (HCC)   . Depression   . GERD (gastroesophageal reflux disease)   . Mild cognitive impairment   . Schizoaffective disorder (HCC)   . Wound abscess     Past Surgical History:  Procedure Laterality Date  . CHOLECYSTECTOMY    . INCISION AND DRAINAGE ABSCESS Right 01/02/2015   Procedure: INCISION AND DRAINAGE ABSCESS;  Surgeon: Tiney Rouge III, MD;  Location: [facility] ORS;  Service: General;  Laterality: Right;  . TONSILLECTOMY     Family History:  Family History  Problem Relation Age of Onset  . Heart attack Father   . Diabetes Mother    Family Psychiatric  History: None known  Social History:  Social History   Substance and Sexual Activity  Alcohol Use No     Social History   Substance and Sexual Activity  Drug Use No    Social History   Socioeconomic History  . Marital status: Single    Spouse name: Not on file  . Number of children: Not on file  . Years of education: Not on file  . Highest education level: Not on file  Occupational History  . Occupation: Disabled  Social Needs  . Financial resource strain: Not on file  . Food insecurity:    Worry: Not on file    Inability: Not on file  . Transportation needs:    Medical: Not on file    Non-medical: Not on file  Tobacco Use  . Smoking status: Former Games developer  . Smokeless tobacco:  Never Used  Substance and Sexual Activity  . Alcohol use: No  . Drug use: No  . Sexual activity: Not Currently  Lifestyle  . Physical activity:    Days per week: Not on file    Minutes per session: Not on file  . Stress: Not on file  Relationships  . Social connections:    Talks on phone: Not on file    Gets together: Not on file    Attends religious service: Not on file    Active member of club or organization: Not on file    Attends meetings of clubs or organizations: Not on file    Relationship  status: Not on file  Other Topics Concern  . Not on file  Social History Narrative   Pt lives with mother, nephew, sister in law   Additional Social History:       Patient is essentially homeless at this time, as she previously was residing in a group home which had provided her 30-day notice. Her 30-day notice is completed.     Sleep: Good  Appetite:  Good  Current Medications: Current Facility-Administered Medications  Medication Dose Route Frequency Provider Last Rate Last Dose  . acetaminophen (TYLENOL) tablet 650 mg  650 mg Oral Once Jeanmarie PlantMcShane, James A, MD      . acetaminophen (TYLENOL) tablet 650 mg  650 mg Oral Q6H PRN Dionne BucySiadecki, Sebastian, MD   650 mg at 08/19/18 0657  . benztropine (COGENTIN) tablet 2 mg  2 mg Oral BID Reggie PileJoshi, Anand, MD   2 mg at 08/18/18 0941  . clonazePAM (KLONOPIN) tablet 1 mg  1 mg Oral BID Reggie PileJoshi, Anand, MD   1 mg at 08/18/18 0941  . diphenhydrAMINE (BENADRYL) capsule 50 mg  50 mg Oral Q8H PRN Mariel CraftMaurer, Sheila M, MD   50 mg at 08/17/18 2203   Or  . diphenhydrAMINE (BENADRYL) injection 50 mg  50 mg Intramuscular Q8H PRN Mariel CraftMaurer, Sheila M, MD   50 mg at 08/14/18 1042  . divalproex (DEPAKOTE ER) 24 hr tablet 1,000 mg  1,000 mg Oral QHS Reggie PileJoshi, Anand, MD   1,000 mg at 08/17/18 2140  . famotidine (PEPCID) tablet 20 mg  20 mg Oral BID Reggie PileJoshi, Anand, MD   20 mg at 08/18/18 0941  . fenofibrate tablet 160 mg  160 mg Oral Daily Reggie PileJoshi, Anand, MD   160 mg at 08/18/18 0941  . fluvoxaMINE (LUVOX) tablet 150 mg  150 mg Oral QHS Mariel CraftMaurer, Sheila M, MD   150 mg at 08/17/18 2140  . levothyroxine (SYNTHROID) tablet 50 mcg  50 mcg Oral Q0600 Reggie PileJoshi, Anand, MD   50 mcg at 08/19/18 0657  . nitrofurantoin (MACRODANTIN) capsule 100 mg  100 mg Oral BID Reggie PileJoshi, Anand, MD   100 mg at 08/18/18 0941  . nystatin (MYCOSTATIN/NYSTOP) topical powder   Topical BID Emily FilbertWilliams, Jonathan E, MD      . OLANZapine [facility](ZYPREXA) tablet 2.5 mg  2.5 mg Oral BID WC Mariel CraftMaurer, Sheila M, MD   2.5 mg at 08/19/18 0726  .  OLANZapine (ZYPREXA) tablet 5 mg  5 mg Oral Q8H PRN Reggie PileJoshi, Anand, MD   5 mg at 08/15/18 2234  . pantoprazole (PROTONIX) EC tablet 40 mg  40 mg Oral Daily Reggie PileJoshi, Anand, MD   40 mg at 08/18/18 0941  . sucralfate (CARAFATE) tablet 1 g  1 g Oral TID WC & HS Reggie PileJoshi, Anand, MD   1 g at 08/19/18 0725   Current Outpatient Medications  Medication Sig  Dispense Refill  . ARIPiprazole (ABILIFY) 20 MG tablet Take 1 tablet (20 mg total) by mouth daily. 30 tablet 0  . benztropine (COGENTIN) 2 MG tablet Take 1 tablet (2 mg total) by mouth 2 (two) times daily. 60 tablet 0  . clonazePAM (KLONOPIN) 1 MG tablet Take 1 tablet (1 mg total) by mouth 2 (two) times daily. 30 tablet 0  . divalproex (DEPAKOTE ER) 500 MG 24 hr tablet Take 2 tablets (1,000 mg total) by mouth at bedtime. 60 tablet 0  . famotidine (PEPCID) 20 MG tablet Take 1 tablet (20 mg total) by mouth 2 (two) times daily. 60 tablet 0  . fenofibrate 160 MG tablet Take 1 tablet (160 mg total) by mouth daily. 30 tablet 1  . fluvoxaMINE (LUVOX) 100 MG tablet Take 1 tablet (100 mg total) by mouth at bedtime. 30 tablet 0  . levothyroxine (SYNTHROID) 50 MCG tablet Take 1 tablet (50 mcg total) by mouth daily before breakfast. 30 tablet 1  . nitrofurantoin (MACRODANTIN) 100 MG capsule Take 1 capsule (100 mg total) by mouth 2 (two) times daily. 60 capsule 0  . OLANZapine (ZYPREXA) 5 MG tablet Take 1 tablet (5 mg total) by mouth every 8 (eight) hours as needed (agitation). 30 tablet 0  . pantoprazole (PROTONIX) 40 MG tablet Take 1 tablet (40 mg total) by mouth daily. 30 tablet 1  . sucralfate (CARAFATE) 1 g tablet Take 1 tablet (1 g total) by mouth 4 (four) times daily -  with meals and at bedtime. 120 tablet 1    Lab Results:  No results found for this or any previous visit (from the past 48 hour(s)).  Blood Alcohol level:  Lab Results  Component Value Date   ETH <10 08/09/2018   ETH <10 08/01/2018    Metabolic Disorder Labs: Lab Results  Component Value  Date   HGBA1C 4.8 08/11/2016   MPG 91 08/11/2016   Lab Results  Component Value Date   PROLACTIN 111.7 (H) 08/11/2016   PROLACTIN 66.8 (H) 06/17/2015   Lab Results  Component Value Date   CHOL 178 08/11/2016   TRIG 402 (H) 08/11/2016   HDL 13 (L) 08/11/2016   CHOLHDL 13.7 08/11/2016   VLDL UNABLE TO CALCULATE IF TRIGLYCERIDE OVER 400 mg/dL 16/01/9603   LDLCALC UNABLE TO CALCULATE IF TRIGLYCERIDE OVER 400 mg/dL 54/12/8117   LDLCALC 82 06/17/2015    Musculoskeletal: Strength & Muscle Tone: within normal limits Gait & Station: normal Patient leans: N/A  Psychiatric Specialty Exam: Physical Exam  Nursing note and vitals reviewed. Constitutional: She appears well-developed and well-nourished. No distress.  HENT:  Head: Normocephalic and atraumatic.  Eyes: EOM are normal.  Neck: Normal range of motion.  Cardiovascular: Normal rate and regular rhythm.  Respiratory: Effort normal. No respiratory distress.  Musculoskeletal: Normal range of motion.  Neurological: She is alert.  Psychiatric: Her behavior is normal. Her speech is delayed. Cognition and memory are impaired. She exhibits a depressed mood. She expresses suicidal ideation. She expresses no homicidal ideation. She expresses no suicidal plans.    Review of Systems  Constitutional: Positive for malaise/fatigue.  Respiratory: Negative.   Cardiovascular: Negative.   Gastrointestinal: Negative.   Genitourinary: Positive for frequency and urgency.       Incontinence  Musculoskeletal: Positive for back pain and myalgias.  Neurological: Negative.   Psychiatric/Behavioral: Positive for depression and suicidal ideas (chronic, passive). Negative for hallucinations, memory loss and substance abuse. The patient is nervous/anxious. The patient does not have insomnia.  Blood pressure 140/88, pulse 67, temperature 98.3 F (36.8 C), temperature source Oral, resp. rate 16, last menstrual period 05/28/2018, SpO2 98 %.There is no  height or weight on file to calculate BMI.  General Appearance: Disheveled  Eye Contact:  Minimal  Speech:  Clear and Coherent  Volume:  Decreased  Mood:  Dysphoric  Affect:  Blunt  Thought Process:  Coherent  Orientation:  Full (Time, Place, and Person)  Thought Content:  Logical   Suicidal Thoughts:  Yes.  without intent/plan  Homicidal Thoughts:  Denies  Memory:  Fair  Judgement:  Fair  Insight:  Shallow  Psychomotor Activity:  Psychomotor Retardation  Concentration:  Concentration: Fair  Recall:  Fair  Fund of Knowledge:  Fair  Language:  Good  Akathisia:  No  Handed:  Right  AIMS (if indicated):     Assets:  Financial Resources/Insurance  ADL's:  Impaired  Cognition:  Impaired,  Mild  Sleep:   Adequate    Treatment Plan Summary: Plan Of Care/Follow-up recommendations:  Daily contact with patient to assess and evaluate symptoms and progress in treatment and Medication management   Adjustment disorder with mixed disturbance of emotions and conduct: Schizoaffective disorder, depressive disorder: Patient agitated and aggressive today with refusal of medication. -Continue Depakote ER 1000 mg at bedtime for mood stabilization -Continue Luvox 150 mg at bedtime for anxiety and depressive disorder -Continue Zyprexa 2.5 mg twice a day with morning and evening meal for mood stabilization  Agitation: -Continue Zyprexa 5 mg every 8 hours PRN agitation -Continue Benadryl 50 mg every 8 hours PRN agitation  Anxiety: -Continue Klonopin 1 mg twice a day breakfast and dinner for anxiety  Asthma: -Continue albuterol 2 puffs every hours  EPS prevention: -Continue Cogentin 2 mg twice daily for EPS prevention, and to maximize anticholinergic effect for assistance in decreasing urinary incontinence.  UTI prevention: -Continue Macrobid 100 mg daily  Hypothyroid: -Continue Synthroid 50 mcg daily for hypothyroidism  GERD: -Continue Protonix 40 mg daily -Continue Pepcid 20 mg  BID -Continue Carafate 1 gram with meals and at bedtime  Elevated triglycerides -Continue Fenofibrate 160 mg daily  Yeast infection prevention: Nystatin powder BID  We will encourage patient to make urological/gynecology appointment after discharge.  Disposition: Continue working with her ACT team/Social work for placement in another group home setting, as patient is unlikely to be successful living independently.  Patient is currently staying in emergency department to receive assistance from social work on housing.  Patient is made aware that she will need to have insured medication compliance before discharge.   Mariel Craft, MD 08/19/2018, 11:27 AM

## 2018-08-19 NOTE — ED Notes (Signed)
Pt awake and now eating dinner tay.

## 2018-08-19 NOTE — ED Notes (Signed)
Pt ambulated to the rest room at this time. 

## 2018-08-19 NOTE — ED Notes (Signed)
[name] gave pt [place] sandwich at this time.

## 2018-08-19 NOTE — ED Notes (Signed)
Pt given snack and water but refused the graham crackers and peanut butter.

## 2018-08-19 NOTE — ED Notes (Signed)
Pt had a bowel movement in the bedside commode.

## 2018-08-20 DIAGNOSIS — F4325 Adjustment disorder with mixed disturbance of emotions and conduct: Secondary | ICD-10-CM | POA: Diagnosis not present

## 2018-08-20 DIAGNOSIS — G809 Cerebral palsy, unspecified: Secondary | ICD-10-CM | POA: Diagnosis not present

## 2018-08-20 DIAGNOSIS — R4189 Other symptoms and signs involving cognitive functions and awareness: Secondary | ICD-10-CM | POA: Diagnosis not present

## 2018-08-20 DIAGNOSIS — F70 Mild intellectual disabilities: Secondary | ICD-10-CM | POA: Diagnosis not present

## 2018-08-20 DIAGNOSIS — F250 Schizoaffective disorder, bipolar type: Secondary | ICD-10-CM | POA: Diagnosis not present

## 2018-08-20 MED ORDER — OLANZAPINE 5 MG PO TABS
5.0000 mg | ORAL_TABLET | Freq: Every day | ORAL | Status: DC
  Administered 2018-08-20 – 2018-08-23 (×4): 5 mg via ORAL
  Filled 2018-08-20 (×4): qty 1

## 2018-08-20 NOTE — ED Notes (Signed)
Report off to [facility] i rn

## 2018-08-20 NOTE — ED Notes (Signed)
Patient calling the doctor names because she can't go down stairs , explained to patient that the doctor has rules to follow and try to understand.

## 2018-08-20 NOTE — Progress Notes (Signed)
[facility] MD Progress Note  08/20/2018 12:04 PM [name]  MRN:  [id number] Subjective:  "I'm suicidal, homicidal and hallucinating.  I think I need to go to [facility]." Principal Problem: Adjustment disorder with mixed disturbance of emotions and conduct Diagnosis: Principal Problem:   Adjustment disorder with mixed disturbance of emotions and conduct Active Problems:   Cerebral palsy (HCC)   Mild intellectual disability   GERD (gastroesophageal reflux disease)   COPD (chronic obstructive pulmonary disease) (HCC)   Schizoaffective disorder, bipolar type (HCC)   Suicidal ideation  Patient is seen, chart is reviewed Total Time spent with patient: 35 minutes   HPI:   On admission 08/08/18:  [name] is a 53 year old Caucasian female with a history of schizoaffective disorder, intellectual disability and borderline personality disorder.  She was seen in emergency department due to suicidal ideations, but once evaluated, she denied having them, and therefore she was discharged. Later on the same day, she came back expressing suicidal thoughts again, and racing thoughts.    Past Psychiatric History: Schizoaffective disorder, mild intellectual disability, multiple suicide attempts.  On evaluation, patient is awake and alert.  She has not had her morning medication and speaks with foul language towards this Clinical research associate.  Patient expresses frustration that her care coordinator has been unable to find her a new living arrangement.  Patient reports that she is suicidal and homicidal today.  She does not state a plan.  She reports that she is having hallucinations, but is unable to provide clarity to these.  Patient continues to be perseverative that she could get better help at [facility]. Historically, she is chronically suicidal at times especially upon arriving to the ED or when she does not want to be at her current living situation, group homes or her family  Frequently comes to the ED  when she does not get what she wants.   Caveat:  Cognitive impairment with low level of frustration.   Awaiting for group home placement.  Per record review, Depakote has not been given since 08/17/2018.  Depakote level drawn August 15, 2018 was non-therapeutic.  Patient historically has non-therapeutic Depakote levels.  Will plan to discontinue this medication and maximize Zyprexa for mood stabilization and prevention of auditory and visual hallucinations which patient complains about today.  Past Medical History:  Past Medical History:  Diagnosis Date  . Anxiety   . Asthma   . Borderline personality disorder (HCC)   . Cerebral palsy (HCC)   . COPD (chronic obstructive pulmonary disease) (HCC)   . Depression   . GERD (gastroesophageal reflux disease)   . Mild cognitive impairment   . Schizoaffective disorder (HCC)   . Wound abscess     Past Surgical History:  Procedure Laterality Date  . CHOLECYSTECTOMY    . INCISION AND DRAINAGE ABSCESS Right 01/02/2015   Procedure: INCISION AND DRAINAGE ABSCESS;  Surgeon: Tiney Rouge III, MD;  Location: [facility] ORS;  Service: General;  Laterality: Right;  . TONSILLECTOMY     Family History:  Family History  Problem Relation Age of Onset  . Heart attack Father   . Diabetes Mother    Family Psychiatric  History: None known  Social History:  Social History   Substance and Sexual Activity  Alcohol Use No     Social History   Substance and Sexual Activity  Drug Use No    Social History   Socioeconomic History  . Marital status: Single    Spouse name: Not on file  .  Number of children: Not on file  . Years of education: Not on file  . Highest education level: Not on file  Occupational History  . Occupation: Disabled  Social Needs  . Financial resource strain: Not on file  . Food insecurity:    Worry: Not on file    Inability: Not on file  . Transportation needs:    Medical: Not on file    Non-medical: Not on file  Tobacco Use  .  Smoking status: Former Games developer  . Smokeless tobacco: Never Used  Substance and Sexual Activity  . Alcohol use: No  . Drug use: No  . Sexual activity: Not Currently  Lifestyle  . Physical activity:    Days per week: Not on file    Minutes per session: Not on file  . Stress: Not on file  Relationships  . Social connections:    Talks on phone: Not on file    Gets together: Not on file    Attends religious service: Not on file    Active member of club or organization: Not on file    Attends meetings of clubs or organizations: Not on file    Relationship status: Not on file  Other Topics Concern  . Not on file  Social History Narrative   Pt lives with mother, nephew, sister in law   Additional Social History:       Patient is essentially homeless at this time, as she previously was residing in a group home which had provided her 30-day notice. Her 30-day notice is completed.     Sleep: Good  Appetite:  Good  Current Medications: Current Facility-Administered Medications  Medication Dose Route Frequency Provider Last Rate Last Dose  . acetaminophen (TYLENOL) tablet 650 mg  650 mg Oral Once Jeanmarie Plant, MD      . acetaminophen (TYLENOL) tablet 650 mg  650 mg Oral Q6H PRN Dionne Bucy, MD   650 mg at 08/19/18 0657  . benztropine (COGENTIN) tablet 2 mg  2 mg Oral BID Reggie Pile, MD   2 mg at 08/20/18 0950  . clonazePAM (KLONOPIN) tablet 1 mg  1 mg Oral BID Reggie Pile, MD   1 mg at 08/20/18 0949  . diphenhydrAMINE (BENADRYL) capsule 50 mg  50 mg Oral Q8H PRN Mariel Craft, MD   50 mg at 08/17/18 2203   Or  . diphenhydrAMINE (BENADRYL) injection 50 mg  50 mg Intramuscular Q8H PRN Mariel Craft, MD   50 mg at 08/14/18 1042  . divalproex (DEPAKOTE ER) 24 hr tablet 1,000 mg  1,000 mg Oral QHS Reggie Pile, MD   1,000 mg at 08/17/18 2140  . famotidine (PEPCID) tablet 20 mg  20 mg Oral BID Reggie Pile, MD   20 mg at 08/20/18 0949  . fenofibrate tablet 160 mg  160  mg Oral Daily Reggie Pile, MD   160 mg at 08/18/18 0941  . fluvoxaMINE (LUVOX) tablet 150 mg  150 mg Oral QHS Mariel Craft, MD   150 mg at 08/19/18 2158  . levothyroxine (SYNTHROID) tablet 50 mcg  50 mcg Oral Q0600 Reggie Pile, MD   50 mcg at 08/20/18 0530  . nitrofurantoin (MACRODANTIN) capsule 100 mg  100 mg Oral BID Reggie Pile, MD   100 mg at 08/19/18 2157  . nystatin (MYCOSTATIN/NYSTOP) topical powder   Topical BID Emily Filbert, MD      . OLANZapine [facility]) tablet 2.5 mg  2.5 mg Oral BID WC Viviano Simas,  Orlean BradfordSheila M, MD   2.5 mg at 08/20/18 0951  . OLANZapine (ZYPREXA) tablet 5 mg  5 mg Oral Q8H PRN Reggie PileJoshi, Anand, MD   5 mg at 08/15/18 2234  . pantoprazole (PROTONIX) EC tablet 40 mg  40 mg Oral Daily Reggie PileJoshi, Anand, MD   40 mg at 08/20/18 0950  . sucralfate (CARAFATE) tablet 1 g  1 g Oral TID WC & HS Reggie PileJoshi, Anand, MD   1 g at 08/20/18 [id number]   Current Outpatient Medications  Medication Sig Dispense Refill  . ARIPiprazole (ABILIFY) 20 MG tablet Take 1 tablet (20 mg total) by mouth daily. 30 tablet 0  . benztropine (COGENTIN) 2 MG tablet Take 1 tablet (2 mg total) by mouth 2 (two) times daily. 60 tablet 0  . clonazePAM (KLONOPIN) 1 MG tablet Take 1 tablet (1 mg total) by mouth 2 (two) times daily. 30 tablet 0  . divalproex (DEPAKOTE ER) 500 MG 24 hr tablet Take 2 tablets (1,000 mg total) by mouth at bedtime. 60 tablet 0  . famotidine (PEPCID) 20 MG tablet Take 1 tablet (20 mg total) by mouth 2 (two) times daily. 60 tablet 0  . fenofibrate 160 MG tablet Take 1 tablet (160 mg total) by mouth daily. 30 tablet 1  . fluvoxaMINE (LUVOX) 100 MG tablet Take 1 tablet (100 mg total) by mouth at bedtime. 30 tablet 0  . levothyroxine (SYNTHROID) 50 MCG tablet Take 1 tablet (50 mcg total) by mouth daily before breakfast. 30 tablet 1  . nitrofurantoin (MACRODANTIN) 100 MG capsule Take 1 capsule (100 mg total) by mouth 2 (two) times daily. 60 capsule 0  . OLANZapine (ZYPREXA) 5 MG tablet Take 1 tablet (5  mg total) by mouth every 8 (eight) hours as needed (agitation). 30 tablet 0  . pantoprazole (PROTONIX) 40 MG tablet Take 1 tablet (40 mg total) by mouth daily. 30 tablet 1  . sucralfate (CARAFATE) 1 g tablet Take 1 tablet (1 g total) by mouth 4 (four) times daily -  with meals and at bedtime. 120 tablet 1    Lab Results:  No results found for this or any previous visit (from the past 48 hour(s)).  Blood Alcohol level:  Lab Results  Component Value Date   ETH <10 08/09/2018   ETH <10 08/01/2018    Metabolic Disorder Labs: Lab Results  Component Value Date   HGBA1C 4.8 08/11/2016   MPG 91 08/11/2016   Lab Results  Component Value Date   PROLACTIN 111.7 (H) 08/11/2016   PROLACTIN 66.8 (H) 06/17/2015   Lab Results  Component Value Date   CHOL 178 08/11/2016   TRIG 402 (H) 08/11/2016   HDL 13 (L) 08/11/2016   CHOLHDL 13.7 08/11/2016   VLDL UNABLE TO CALCULATE IF TRIGLYCERIDE OVER 400 mg/dL 96/04/540904/27/2018   LDLCALC UNABLE TO CALCULATE IF TRIGLYCERIDE OVER 400 mg/dL 81/19/147804/27/2018   LDLCALC 82 06/17/2015    Musculoskeletal: Strength & Muscle Tone: within normal limits Gait & Station: normal Patient leans: N/A  Psychiatric Specialty Exam: Physical Exam  Nursing note and vitals reviewed. Constitutional: She appears well-developed and well-nourished. No distress.  HENT:  Head: Normocephalic and atraumatic.  Eyes: EOM are normal.  Neck: Normal range of motion.  Cardiovascular: Normal rate and regular rhythm.  Respiratory: Effort normal. No respiratory distress.  Musculoskeletal: Normal range of motion.  Neurological: She is alert.  Psychiatric: Her affect is angry and inappropriate. Her speech is tangential. Her speech is not delayed. She is agitated. Cognition and memory  are impaired. She expresses impulsivity and inappropriate judgment. She exhibits a depressed mood. She expresses homicidal and suicidal ideation. She expresses no suicidal plans and no homicidal plans.    Review  of Systems  Constitutional: Negative.   Respiratory: Negative.   Cardiovascular: Negative.   Gastrointestinal: Negative.   Genitourinary: Positive for frequency and urgency.       Incontinence  Musculoskeletal: Positive for back pain and myalgias.  Neurological: Negative.   Psychiatric/Behavioral: Positive for depression, hallucinations and suicidal ideas (chronic, passive). Negative for memory loss and substance abuse. The patient is nervous/anxious. The patient does not have insomnia.     Blood pressure (!) 116/93, pulse 73, temperature 98.6 F (37 C), temperature source Oral, resp. rate 18, last menstrual period 05/28/2018, SpO2 100 %.There is no height or weight on file to calculate BMI.  General Appearance: Disheveled  Eye Contact:  Good  Speech:  Clear and Coherent  Volume:  Normal  Mood:  Dysphoric and Irritable  Affect:  Blunt  Thought Process:  Coherent  Orientation:  Full (Time, Place, and Person)  Thought Content:  Logical, reports AVH  Suicidal Thoughts:  Yes.  without intent/plan  Homicidal Thoughts:  Yes.  without intent/plan  Memory:  Fair  Judgement:  Fair  Insight:  Shallow  Psychomotor Activity:  Psychomotor Retardation  Concentration:  Concentration: Fair  Recall:  Fair  Fund of Knowledge:  Fair  Language:  Good  Akathisia:  No  Handed:  Right  AIMS (if indicated):     Assets:  Financial Resources/Insurance  ADL's:  Impaired  Cognition:  Impaired,  Mild  Sleep:   Adequate    Treatment Plan Summary: Plan Of Care/Follow-up recommendations:  Daily contact with patient to assess and evaluate symptoms and progress in treatment and Medication management   Adjustment disorder with mixed disturbance of emotions and conduct: Schizoaffective disorder, depressive disorder: Patient agitated and aggressive today with refusal of medication. -Discontinue Depakote ER 1000 mg at bedtime for mood stabilization Per record review, Depakote has not been given since  08/17/2018.  Depakote level drawn August 15, 2018 was non-therapeutic.  Patient historically has non-therapeutic Depakote levels.  Will plan to discontinue this medication and maximize Zyprexa for mood stabilization and prevention of auditory and visual hallucinations which patient complains about today.  -Continue Luvox 150 mg at bedtime for anxiety and depressive disorder -Continue Zyprexa 2.5 mg twice a day with morning and evening meal for mood stabilization - Add Zyprexa 5 mg at HS for mood stabilization and psychotic features.   Agitation: -Continue Zyprexa 5 mg every 8 hours PRN agitation -Continue Benadryl 50 mg every 8 hours PRN agitation  Anxiety: -Continue Klonopin 1 mg twice a day breakfast and dinner for anxiety  Asthma: -Continue albuterol 2 puffs every hours  EPS prevention: -Continue Cogentin 2 mg twice daily for EPS prevention, and to maximize anticholinergic effect for assistance in decreasing urinary incontinence.  UTI prevention: -Continue Macrobid 100 mg daily  Hypothyroid: -Continue Synthroid 50 mcg daily for hypothyroidism  GERD: -Continue Protonix 40 mg daily -Continue Pepcid 20 mg BID -Continue Carafate 1 gram with meals and at bedtime  Elevated triglycerides -Continue Fenofibrate 160 mg daily  Yeast infection prevention: Nystatin powder BID  We will encourage patient to make urological/gynecology appointment after discharge.  Disposition: Continue working with her ACT team/Social work for placement in another group home setting, as patient is unlikely to be successful living independently.  Patient is currently staying in emergency department to receive assistance from  social work on housing.  Patient is made aware that she will need to have insured medication compliance before discharge.   Mariel Craft, MD 08/20/2018, 12:04 PM

## 2018-08-20 NOTE — TOC Progression Note (Signed)
Transition of Care [facility]) - Progression Note    Patient Details  Name: [name] MRN: [id number] Date of Birth: 1966-02-28  [facility] CM/SW Contact  Tania Juliza Machnik, LCSW Phone Number: 08/20/2018, 9:25 AM  Clinical Narrative:    Pt requested to speak with CSW. Pt wanted to know how far along we were regarding group home placement.  CSW informed pt that her Care Coordinator and CST were working on placement. CSW also informed pt that she is currently waiting for someone from [facility] to contact her regarding placement. Pt understood, and shared that she would prefer to go to [facility], if possible.

## 2018-08-20 NOTE — ED Provider Notes (Signed)
-----------------------------------------   10:09 AM on 08/20/2018 -----------------------------------------   BP (!) 116/93 (BP Location: Right Arm)   Pulse 73   Temp 98.6 F (37 C) (Oral)   Resp 18   LMP 05/28/2018 (LMP Unknown)   SpO2 100%   No acute events overnight. Vitals reviewed. Patient remains medically cleared.  Disposition is pending per Psychiatry/Behavioral Medicine team recommendations.    Jene Every, MD 08/20/18 1009

## 2018-08-20 NOTE — ED Notes (Addendum)
Writer spoke with Child psychotherapist and asked if she could come and speak with patient, Child psychotherapist said she would

## 2018-08-20 NOTE — TOC Progression Note (Signed)
Transition of Care [facility]) - Progression Note    Patient Details  Name: [name] MRN: [id number] Date of Birth: 1965-05-11  Transition of Care [facility] CM/SW Contact  Tania Sharell Hilmer, LCSW Phone Number: 08/20/2018, 4:01 PM  Clinical Narrative:     3:42pm- Pt's Care Coordinator, Larena Sox called CSW and informed her that her and her team will be discussing pt tomorrow and will be trying to get her into a higher level state-funded group home, and reported that "this is not a quick process." [name] stated that her and pt's CST have been calling many group homes and have not had any luck.  CSW provided [name] with contact for the psychiatrist in case she had additional questions. [name] also needed a copy of current medications. CSW had that faxed to her at [phone].   [name] shared that she will "keep everyone informed."

## 2018-08-20 NOTE — TOC Progression Note (Signed)
Transition of Care [facility]) - Progression Note    Patient Details  Name: [name] MRN: [id number] Date of Birth: 12-25-1965  Transition of Care [facility]) CM/SW Contact  Tania Kaya Pottenger, LCSW Phone Number: 08/20/2018, 9:04 AM  Clinical Narrative:     CSW contacted [facility] to see if someone was going to follow-up regarding I/DD group home placement. [facility] representative informed CSW that someone will follow-up regarding this pt.                                                        Social Determinants of Health (SDOH) Interventions    Readmission Risk Interventions No flowsheet data found.

## 2018-08-20 NOTE — ED Notes (Signed)
Patient continues to ask to go to [facility], writer informed patient that it is up to her community support team ([place]) we [facility]) can not make that decision, we are focused on helping her find a level 2 group home

## 2018-08-20 NOTE — ED Notes (Signed)
Patient wanted to know if she could go to [facility], she wants to talk to all the staff members (doctors, social workers, TTS) to see if she could leave

## 2018-08-21 DIAGNOSIS — F4325 Adjustment disorder with mixed disturbance of emotions and conduct: Secondary | ICD-10-CM | POA: Diagnosis not present

## 2018-08-21 DIAGNOSIS — R4189 Other symptoms and signs involving cognitive functions and awareness: Secondary | ICD-10-CM | POA: Diagnosis not present

## 2018-08-21 DIAGNOSIS — G809 Cerebral palsy, unspecified: Secondary | ICD-10-CM | POA: Diagnosis not present

## 2018-08-21 DIAGNOSIS — F70 Mild intellectual disabilities: Secondary | ICD-10-CM | POA: Diagnosis not present

## 2018-08-21 DIAGNOSIS — F250 Schizoaffective disorder, bipolar type: Secondary | ICD-10-CM | POA: Diagnosis not present

## 2018-08-21 NOTE — ED Notes (Signed)
BEHAVIORAL HEALTH ROUNDING Patient sleeping: Yes.   Patient alert and oriented: eyes closed  Appears to be asleep Behavior appropriate: Yes.  ; If no, describe:  Nutrition and fluids offered: Yes  Toileting and hygiene offered: sleeping Sitter present: q 15 minute observations and security monitoring Law enforcement present: yes  ODS 

## 2018-08-21 NOTE — TOC Progression Note (Signed)
Transition of Care [facility]) - Progression Note    Patient Details  Name: Vonnetta Schmieg MRN: [id number] Date of Birth: November 13, 1965  Transition of Care [facility]) CM/SW Contact  Tania Demarri Elie, LCSW Phone Number: 08/21/2018, 2:30 PM  Clinical Narrative:    1:25pm - Marlaine Hind from [facility] contacted CSW and shared that she has not followed-up because the contact information on the pt's ticket was for her Care Coordinator, Larena Sox, not CSW. Chelsea asked for updated information regarding pt, and shared that pt may have difficulty with getting placed in a group home due to being a female, and functioning at a high level. Chelsea stated that she has sent pt's information to a variety of facilities in the area, and will update CSW.         Expected Discharge Plan and Services                                                 Social Determinants of Health (SDOH) Interventions    Readmission Risk Interventions No flowsheet data found.

## 2018-08-21 NOTE — ED Notes (Signed)
Patient observed lying in bed with eyes closed  Even, unlabored respirations observed   NAD pt appears to be sleeping  I will continue to monitor along with every 15 minute visual observations and ongoing security monitoring    

## 2018-08-21 NOTE — ED Notes (Signed)

## 2018-08-21 NOTE — ED Notes (Signed)
BEHAVIORAL HEALTH ROUNDING Patient sleeping: Yes.   Patient alert and oriented: eyes closed  Appears asleep Behavior appropriate: Yes.  ; If no, describe:  Nutrition and fluids offered: Yes  Toileting and hygiene offered: sleeping Sitter present: q 15 minute observations and security monitoring Law enforcement present: yes  ODS 

## 2018-08-21 NOTE — ED Notes (Signed)
ED Is the patient under IVC or is there intent for IVC:  voluntary Is the patient medically cleared: Yes.   Is there vacancy in the ED BHU: Yes. Unit closed   Is the population mix appropriate for patient: Yes.   Is the patient awaiting placement in inpatient or outpatient setting: Yes.   Has the patient had a psychiatric consult: Yes.   Survey of unit performed for contraband, proper placement and condition of furniture, tampering with fixtures in bathroom, shower, and each patient room: Yes.  ; Findings:  APPEARANCE/BEHAVIOR  cooperative NEURO ASSESSMENT Orientation: oriented x3  Denies pain Hallucinations: No.None noted (Hallucinations)  denies Speech: Normal Gait: normal RESPIRATORY ASSESSMENT Even  Unlabored respirations  CARDIOVASCULAR ASSESSMENT Pulses equal   regular rate  Skin warm and dry   GASTROINTESTINAL ASSESSMENT no GI complaint EXTREMITIES Full ROM  PLAN OF CARE Provide calm/safe environment. Vital signs assessed twice daily. ED BHU Assessment once each 12-hour shift. Collaborate with TTS daily or as condition indicates. Assure the ED provider has rounded once each shift. Provide and encourage hygiene. Provide redirection as needed. Assess for escalating behavior; address immediately and inform ED provider.  Assess family dynamic and appropriateness for visitation as needed: Yes.  ; If necessary, describe findings:  Educate the patient/family about BHU procedures/visitation: Yes.  ; If necessary, describe findings:

## 2018-08-21 NOTE — ED Notes (Signed)
VOL/Pending Placement by CSW 

## 2018-08-21 NOTE — ED Notes (Signed)
Shower completed  - linens changed  Room cleaned

## 2018-08-21 NOTE — ED Notes (Signed)
BEHAVIORAL HEALTH ROUNDING Patient sleeping: No. Patient alert and oriented: yes Behavior appropriate: Yes.  ; If no, describe:  Nutrition and fluids offered: yes Toileting and hygiene offered: Yes  Sitter present: q15 minute observations and security  monitoring Law enforcement present: Yes  ODS  

## 2018-08-21 NOTE — ED Provider Notes (Signed)
-----------------------------------------   3:42 AM on 08/21/2018 -----------------------------------------   Blood pressure 118/82, pulse 76, temperature 97.9 F (36.6 C), temperature source Oral, resp. rate 18, last menstrual period 05/28/2018, SpO2 97 %.  The patient is calm and cooperative at this time.  There have been no acute events since the last update.  Awaiting disposition plan from Behavioral Medicine team.   Loleta Rose, MD 08/21/18 [phone]

## 2018-08-21 NOTE — ED Notes (Signed)
Pt ambulatory to bathroom with no difficulty 

## 2018-08-21 NOTE — Progress Notes (Signed)
[facility] MD Progress Note  08/21/2018 3:58 PM [name]  MRN:  [id number] Subjective:  "I'm tired" Principal Problem: Adjustment disorder with mixed disturbance of emotions and conduct Diagnosis: Principal Problem:   Adjustment disorder with mixed disturbance of emotions and conduct Active Problems:   Cerebral palsy (HCC)   Mild intellectual disability   GERD (gastroesophageal reflux disease)   COPD (chronic obstructive pulmonary disease) (HCC)   Schizoaffective disorder, bipolar type (HCC)   Suicidal ideation  Patient is seen, chart is reviewed Total Time spent with patient: 15 minutes   HPI:   On admission 08/08/18:  [name] is a 53 year old Caucasian female with a history of schizoaffective disorder, intellectual disability and borderline personality disorder.  She was seen in emergency department due to suicidal ideations, but once evaluated, she denied having them, and therefore she was discharged. Later on the same day, she came back expressing suicidal thoughts again, and racing thoughts.    Past Psychiatric History: Schizoaffective disorder, mild intellectual disability, multiple suicide attempts.  On evaluation, patient has been sleeping the majority of the morning, after medication change last night due to medication non-compliance of Depakote.  She is difficult to awaken. She has had no behavioral issues reported by nursing staff today.  Patient continues to endorse some vague SI, HI, and AVH.   Historically, she is chronically suicidal at times especially upon arriving to the ED or when she does not want to be at her current living situation, group homes or her family  Frequently comes to the ED when she does not get what she wants.   Caveat:  Cognitive impairment with low level of frustration.   Awaiting for group home placement.  Past Medical History:  Past Medical History:  Diagnosis Date  . Anxiety   . Asthma   . Borderline personality disorder (HCC)   . Cerebral  palsy (HCC)   . COPD (chronic obstructive pulmonary disease) (HCC)   . Depression   . GERD (gastroesophageal reflux disease)   . Mild cognitive impairment   . Schizoaffective disorder (HCC)   . Wound abscess     Past Surgical History:  Procedure Laterality Date  . CHOLECYSTECTOMY    . INCISION AND DRAINAGE ABSCESS Right 01/02/2015   Procedure: INCISION AND DRAINAGE ABSCESS;  Surgeon: Tiney Rouge III, MD;  Location: [facility] ORS;  Service: General;  Laterality: Right;  . TONSILLECTOMY     Family History:  Family History  Problem Relation Age of Onset  . Heart attack Father   . Diabetes Mother    Family Psychiatric  History: None known  Social History:  Social History   Substance and Sexual Activity  Alcohol Use No     Social History   Substance and Sexual Activity  Drug Use No    Social History   Socioeconomic History  . Marital status: Single    Spouse name: Not on file  . Number of children: Not on file  . Years of education: Not on file  . Highest education level: Not on file  Occupational History  . Occupation: Disabled  Social Needs  . Financial resource strain: Not on file  . Food insecurity:    Worry: Not on file    Inability: Not on file  . Transportation needs:    Medical: Not on file    Non-medical: Not on file  Tobacco Use  . Smoking status: Former Games developer  . Smokeless tobacco: Never Used  Substance and Sexual Activity  . Alcohol use: No  .  Drug use: No  . Sexual activity: Not Currently  Lifestyle  . Physical activity:    Days per week: Not on file    Minutes per session: Not on file  . Stress: Not on file  Relationships  . Social connections:    Talks on phone: Not on file    Gets together: Not on file    Attends religious service: Not on file    Active member of club or organization: Not on file    Attends meetings of clubs or organizations: Not on file    Relationship status: Not on file  Other Topics Concern  . Not on file  Social  History Narrative   Pt lives with mother, nephew, sister in law   Additional Social History:       Patient is essentially homeless at this time, as she previously was residing in a group home which had provided her 30-day notice. Her 30-day notice is completed.     Sleep: Good  Appetite:  Good  Current Medications: Current Facility-Administered Medications  Medication Dose Route Frequency Provider Last Rate Last Dose  . acetaminophen (TYLENOL) tablet 650 mg  650 mg Oral Once Jeanmarie Plant, MD      . acetaminophen (TYLENOL) tablet 650 mg  650 mg Oral Q6H PRN Dionne Bucy, MD   650 mg at 08/19/18 0657  . benztropine (COGENTIN) tablet 2 mg  2 mg Oral BID Reggie Pile, MD   2 mg at 08/20/18 2119  . clonazePAM (KLONOPIN) tablet 1 mg  1 mg Oral BID Reggie Pile, MD   1 mg at 08/20/18 2120  . diphenhydrAMINE (BENADRYL) capsule 50 mg  50 mg Oral Q8H PRN Mariel Craft, MD   50 mg at 08/17/18 2203   Or  . diphenhydrAMINE (BENADRYL) injection 50 mg  50 mg Intramuscular Q8H PRN Mariel Craft, MD   50 mg at 08/14/18 1042  . famotidine (PEPCID) tablet 20 mg  20 mg Oral BID Reggie Pile, MD   20 mg at 08/20/18 2120  . fenofibrate tablet 160 mg  160 mg Oral Daily Reggie Pile, MD   160 mg at 08/20/18 1100  . fluvoxaMINE (LUVOX) tablet 150 mg  150 mg Oral QHS Mariel Craft, MD   150 mg at 08/20/18 2118  . levothyroxine (SYNTHROID) tablet 50 mcg  50 mcg Oral Q0600 Reggie Pile, MD   50 mcg at 08/21/18 0605  . nitrofurantoin (MACRODANTIN) capsule 100 mg  100 mg Oral BID Reggie Pile, MD   100 mg at 08/20/18 2123  . nystatin (MYCOSTATIN/NYSTOP) topical powder   Topical BID Emily Filbert, MD      . OLANZapine [facility]) tablet 2.5 mg  2.5 mg Oral BID WC Mariel Craft, MD   2.5 mg at 08/20/18 1800  . OLANZapine (ZYPREXA) tablet 5 mg  5 mg Oral Q8H PRN Reggie Pile, MD   5 mg at 08/15/18 2234  . OLANZapine (ZYPREXA) tablet 5 mg  5 mg Oral QHS Mariel Craft, MD   5 mg at  08/20/18 2120  . pantoprazole (PROTONIX) EC tablet 40 mg  40 mg Oral Daily Reggie Pile, MD   40 mg at 08/20/18 0950  . sucralfate (CARAFATE) tablet 1 g  1 g Oral TID WC & HS Reggie Pile, MD   1 g at 08/20/18 2120   Current Outpatient Medications  Medication Sig Dispense Refill  . ARIPiprazole (ABILIFY) 20 MG tablet Take 1 tablet (20 mg total) by  mouth daily. 30 tablet 0  . benztropine (COGENTIN) 2 MG tablet Take 1 tablet (2 mg total) by mouth 2 (two) times daily. 60 tablet 0  . clonazePAM (KLONOPIN) 1 MG tablet Take 1 tablet (1 mg total) by mouth 2 (two) times daily. 30 tablet 0  . divalproex (DEPAKOTE ER) 500 MG 24 hr tablet Take 2 tablets (1,000 mg total) by mouth at bedtime. 60 tablet 0  . famotidine (PEPCID) 20 MG tablet Take 1 tablet (20 mg total) by mouth 2 (two) times daily. 60 tablet 0  . fenofibrate 160 MG tablet Take 1 tablet (160 mg total) by mouth daily. 30 tablet 1  . fluvoxaMINE (LUVOX) 100 MG tablet Take 1 tablet (100 mg total) by mouth at bedtime. 30 tablet 0  . levothyroxine (SYNTHROID) 50 MCG tablet Take 1 tablet (50 mcg total) by mouth daily before breakfast. 30 tablet 1  . nitrofurantoin (MACRODANTIN) 100 MG capsule Take 1 capsule (100 mg total) by mouth 2 (two) times daily. 60 capsule 0  . OLANZapine (ZYPREXA) 5 MG tablet Take 1 tablet (5 mg total) by mouth every 8 (eight) hours as needed (agitation). 30 tablet 0  . pantoprazole (PROTONIX) 40 MG tablet Take 1 tablet (40 mg total) by mouth daily. 30 tablet 1  . sucralfate (CARAFATE) 1 g tablet Take 1 tablet (1 g total) by mouth 4 (four) times daily -  with meals and at bedtime. 120 tablet 1    Lab Results:  No results found for this or any previous visit (from the past 48 hour(s)).  Blood Alcohol level:  Lab Results  Component Value Date   ETH <10 08/09/2018   ETH <10 08/01/2018    Metabolic Disorder Labs: Lab Results  Component Value Date   HGBA1C 4.8 08/11/2016   MPG 91 08/11/2016   Lab Results  Component  Value Date   PROLACTIN 111.7 (H) 08/11/2016   PROLACTIN 66.8 (H) 06/17/2015   Lab Results  Component Value Date   CHOL 178 08/11/2016   TRIG 402 (H) 08/11/2016   HDL 13 (L) 08/11/2016   CHOLHDL 13.7 08/11/2016   VLDL UNABLE TO CALCULATE IF TRIGLYCERIDE OVER 400 mg/dL 16/01/9603   LDLCALC UNABLE TO CALCULATE IF TRIGLYCERIDE OVER 400 mg/dL 54/12/8117   LDLCALC 82 06/17/2015    Musculoskeletal: Strength & Muscle Tone: within normal limits Gait & Station: normal Patient leans: N/A  Psychiatric Specialty Exam: Physical Exam  Nursing note and vitals reviewed. Constitutional: She appears well-developed and well-nourished. No distress.  HENT:  Head: Normocephalic and atraumatic.  Eyes: EOM are normal.  Neck: Normal range of motion.  Cardiovascular: Normal rate and regular rhythm.  Respiratory: Effort normal. No respiratory distress.  Musculoskeletal: Normal range of motion.  Neurological: She is alert.  Psychiatric: Her behavior is normal. Her speech is delayed. Cognition and memory are impaired. She exhibits a depressed mood. She expresses suicidal ideation. She expresses no homicidal ideation. She expresses no suicidal plans.    Review of Systems  Constitutional: Positive for malaise/fatigue.  Respiratory: Negative.   Cardiovascular: Negative.   Gastrointestinal: Negative.   Genitourinary: Positive for frequency and urgency.       Incontinence  Musculoskeletal: Positive for back pain and myalgias.  Neurological: Negative.   Psychiatric/Behavioral: Positive for depression and suicidal ideas (chronic, passive). Negative for hallucinations, memory loss and substance abuse. The patient is nervous/anxious. The patient does not have insomnia.     Blood pressure (!) 109/54, pulse 62, temperature 98.5 F (36.9 C), temperature source  Oral, resp. rate 18, last menstrual period 05/28/2018, SpO2 95 %.There is no height or weight on file to calculate BMI.  General Appearance: Disheveled   Eye Contact:  Minimal  Speech:  Clear and Coherent  Volume:  Decreased  Mood:  Dysphoric  Affect:  Blunt  Thought Process:  Coherent  Orientation:  Full (Time, Place, and Person)  Thought Content:  Logical   Suicidal Thoughts:  Yes.  without intent/plan  Homicidal Thoughts:  Denies  Memory:  Fair  Judgement:  Fair  Insight:  Shallow  Psychomotor Activity:  Psychomotor Retardation  Concentration:  Concentration: Fair  Recall:  Fair  Fund of Knowledge:  Fair  Language:  Good  Akathisia:  No  Handed:  Right  AIMS (if indicated):     Assets:  Financial Resources/Insurance  ADL's:  Impaired  Cognition:  Impaired,  Mild  Sleep:   Adequate    Treatment Plan Summary: Plan Of Care/Follow-up recommendations:  Daily contact with patient to assess and evaluate symptoms and progress in treatment and Medication management   Adjustment disorder with mixed disturbance of emotions and conduct: Schizoaffective disorder, depressive disorder: Patient agitated and aggressive today with refusal of medication. -Discontinued Depakote ER 1000 mg at bedtime for mood stabilization Per record review, Depakote has not been given since 08/17/2018.  Depakote level drawn August 15, 2018 was non-therapeutic.  Patient historically has non-therapeutic Depakote levels.  Will plan to discontinue this medication and maximize Zyprexa for mood stabilization and prevention of auditory and visual hallucinations which patient complains about today.  -Continue Luvox150 mg at bedtime for anxiety and depressive disorder -Continue Zyprexa 2.5 mg twice a day with morning and evening meal for mood stabilization -Continue Zyprexa 5 mg at HS for mood stabilization and psychotic features.   Agitation: -Continue Zyprexa 5 mg every 8 hours PRN agitation -Continue Benadryl 50 mg every 8 hours PRN agitation  Anxiety: -Continue Klonopin 1 mg twice a day breakfast and dinner for anxiety  Asthma: -Continue albuterol 2  puffs every hours  EPS prevention: -Continue Cogentin 2 mg twice daily for EPS prevention, and to maximize anticholinergic effect for assistance in decreasing urinary incontinence.  UTI prevention: -Continue Macrobid 100 mg daily  Hypothyroid: -Continue Synthroid 50 mcg daily for hypothyroidism  GERD: -Continue Protonix 40 mg daily -Continue Pepcid 20 mg BID -Continue Carafate 1 gram with meals and at bedtime  Elevated triglycerides -Continue Fenofibrate 160 mg daily  Yeast infection prevention: Nystatin powder BID  We will encourage patient to makeurological/gynecology appointment after discharge.  Disposition: Continue working with her ACT team/Social work for placement in another group home setting, as patient is unlikely to be successful living independently. Patient is currently staying in emergency department to receive assistance from social work on housing.  Patient is made aware that she will need to have insured medication compliance before discharge.  Mariel CraftSHEILA M , MD 08/21/2018, 3:58 PM

## 2018-08-21 NOTE — ED Notes (Signed)
Pt up to the restroom at this time.

## 2018-08-21 NOTE — ED Notes (Signed)
Pt. Sitting on bed in room.  Pt. Asking about snack time tonight.  Pt. Told she could have snack or whole meal tray if hungry.  Pt. Also asking if a group home has been found for her.  Pt. Told staff was still looking for safe group home for patient and when one was found staff would talk to her and a representative from group home would talk to her as well.

## 2018-08-22 DIAGNOSIS — F4325 Adjustment disorder with mixed disturbance of emotions and conduct: Secondary | ICD-10-CM | POA: Diagnosis not present

## 2018-08-22 DIAGNOSIS — G809 Cerebral palsy, unspecified: Secondary | ICD-10-CM | POA: Diagnosis not present

## 2018-08-22 DIAGNOSIS — F250 Schizoaffective disorder, bipolar type: Secondary | ICD-10-CM | POA: Diagnosis not present

## 2018-08-22 DIAGNOSIS — R4189 Other symptoms and signs involving cognitive functions and awareness: Secondary | ICD-10-CM | POA: Diagnosis not present

## 2018-08-22 DIAGNOSIS — F70 Mild intellectual disabilities: Secondary | ICD-10-CM | POA: Diagnosis not present

## 2018-08-22 NOTE — ED Notes (Signed)
BEHAVIORAL HEALTH ROUNDING Patient sleeping: No. Patient alert : yes Behavior appropriate: Yes.  ; If no, describe:  Nutrition and fluids offered: yes Toileting and hygiene offered: Yes  Sitter present: q15 minute observations and security monitoring Law enforcement present: Yes  ODS  

## 2018-08-22 NOTE — ED Notes (Signed)
Snack given to patient.  

## 2018-08-22 NOTE — ED Notes (Signed)
Pt up to the bathroom

## 2018-08-22 NOTE — ED Notes (Signed)
Pt observed sitting on the side of the bed -  Talkative at this time  Verbalizing a desire to leave  "I want to go to another hospital or a new group home - I am ready to go  Can you call the sheriff to come pick me up?"  Plan of care discussed

## 2018-08-22 NOTE — ED Notes (Addendum)
Pt crying in room, stating that she wants to be transferred to [facility].  States that she has seen two other patients transferred to hospitals, so staff is lying to her.  States, "You're lying you, black bitch!"

## 2018-08-22 NOTE — ED Notes (Signed)
Pt urinated in bed. Staff changed pt's bed.  Pt refused to take a shower.  Pt stated, "I'll take a wash up.  I'm not taking a shower."

## 2018-08-22 NOTE — Progress Notes (Signed)
[facility] MD Progress Note  08/22/2018 9:42 AM [name]  MRN:  [id number] Subjective:  "Could I be admitted so I could go to groups?" Principal Problem: Adjustment disorder with mixed disturbance of emotions and conduct Diagnosis: Principal Problem:   Adjustment disorder with mixed disturbance of emotions and conduct Active Problems:   Cerebral palsy (HCC)   Mild intellectual disability   GERD (gastroesophageal reflux disease)   COPD (chronic obstructive pulmonary disease) (HCC)   Schizoaffective disorder, bipolar type (HCC)   Suicidal ideation  Patient is seen, chart is reviewed Total Time spent with patient: 25 min.   HPI:   On admission 08/08/18:  [name] is a 53 year old Caucasian female with a history of schizoaffective disorder, intellectual disability and borderline personality disorder.  She was seen in emergency department due to suicidal ideations, but once evaluated, she denied having them, and therefore she was discharged. Later on the same day, she came back expressing suicidal thoughts again, and racing thoughts.    Past Psychiatric History: Schizoaffective disorder, mild intellectual disability, multiple suicide attempts.  On evaluation, patient is awake and alert.  She has been compliant with medication and has had no behavioral issues overnight.  She is hopeful to discharge out of the emergency room soon, and even requested admission so that she can attend groups.  Patient reports that she did go to a day treatment program when she lived in her last group home which was "so-so".  Patient denies SI, HI, AVH today. Historically, she is chronically suicidal at times especially upon arriving to the ED or when she does not want to be at her current living situation, group homes or her family  Frequently comes to the ED when she does not get what she wants.   Caveat:  Cognitive impairment with low level of frustration tolerance.   Awaiting for group home placement. Care  coordinator has gotten approval for higher level of care.  Patient has 2 facilities that will be scheduling interviews with her for placement.  Past Medical History:  Past Medical History:  Diagnosis Date  . Anxiety   . Asthma   . Borderline personality disorder (HCC)   . Cerebral palsy (HCC)   . COPD (chronic obstructive pulmonary disease) (HCC)   . Depression   . GERD (gastroesophageal reflux disease)   . Mild cognitive impairment   . Schizoaffective disorder (HCC)   . Wound abscess     Past Surgical History:  Procedure Laterality Date  . CHOLECYSTECTOMY    . INCISION AND DRAINAGE ABSCESS Right 01/02/2015   Procedure: INCISION AND DRAINAGE ABSCESS;  Surgeon: Tiney Rouge III, MD;  Location: [facility] ORS;  Service: General;  Laterality: Right;  . TONSILLECTOMY     Family History:  Family History  Problem Relation Age of Onset  . Heart attack Father   . Diabetes Mother    Family Psychiatric  History: None known  Social History:  Social History   Substance and Sexual Activity  Alcohol Use No     Social History   Substance and Sexual Activity  Drug Use No    Social History   Socioeconomic History  . Marital status: Single    Spouse name: Not on file  . Number of children: Not on file  . Years of education: Not on file  . Highest education level: Not on file  Occupational History  . Occupation: Disabled  Social Needs  . Financial resource strain: Not on file  . Food insecurity:    Worry:  Not on file    Inability: Not on file  . Transportation needs:    Medical: Not on file    Non-medical: Not on file  Tobacco Use  . Smoking status: Former Games developer  . Smokeless tobacco: Never Used  Substance and Sexual Activity  . Alcohol use: No  . Drug use: No  . Sexual activity: Not Currently  Lifestyle  . Physical activity:    Days per week: Not on file    Minutes per session: Not on file  . Stress: Not on file  Relationships  . Social connections:    Talks on phone:  Not on file    Gets together: Not on file    Attends religious service: Not on file    Active member of club or organization: Not on file    Attends meetings of clubs or organizations: Not on file    Relationship status: Not on file  Other Topics Concern  . Not on file  Social History Narrative   Pt lives with mother, nephew, sister in law   Additional Social History:       Patient is essentially homeless at this time, as she previously was residing in a group home which had provided her 30-day notice. Her 30-day notice is completed.   Care coordinator has gotten approval for higher level of care.  Patient has 2 facilities that will be scheduling interviews with her for placement.   Sleep: Good  Appetite:  Good  Current Medications: Current Facility-Administered Medications  Medication Dose Route Frequency Provider Last Rate Last Dose  . acetaminophen (TYLENOL) tablet 650 mg  650 mg Oral Once Jeanmarie Plant, MD      . acetaminophen (TYLENOL) tablet 650 mg  650 mg Oral Q6H PRN Dionne Bucy, MD   650 mg at 08/19/18 0657  . benztropine (COGENTIN) tablet 2 mg  2 mg Oral BID Reggie Pile, MD   2 mg at 08/21/18 2139  . clonazePAM (KLONOPIN) tablet 1 mg  1 mg Oral BID Reggie Pile, MD   1 mg at 08/21/18 2139  . diphenhydrAMINE (BENADRYL) capsule 50 mg  50 mg Oral Q8H PRN Mariel Craft, MD   50 mg at 08/17/18 2203   Or  . diphenhydrAMINE (BENADRYL) injection 50 mg  50 mg Intramuscular Q8H PRN Mariel Craft, MD   50 mg at 08/14/18 1042  . famotidine (PEPCID) tablet 20 mg  20 mg Oral BID Reggie Pile, MD   20 mg at 08/21/18 2138  . fenofibrate tablet 160 mg  160 mg Oral Daily Reggie Pile, MD   160 mg at 08/21/18 1652  . fluvoxaMINE (LUVOX) tablet 150 mg  150 mg Oral QHS Mariel Craft, MD   150 mg at 08/21/18 2138  . levothyroxine (SYNTHROID) tablet 50 mcg  50 mcg Oral Q0600 Reggie Pile, MD   50 mcg at 08/21/18 1652  . nitrofurantoin (MACRODANTIN) capsule 100 mg  100 mg  Oral BID Reggie Pile, MD   100 mg at 08/21/18 2138  . nystatin (MYCOSTATIN/NYSTOP) topical powder   Topical BID Emily Filbert, MD      . OLANZapine [facility]) tablet 2.5 mg  2.5 mg Oral BID WC Mariel Craft, MD   2.5 mg at 08/22/18 0837  . OLANZapine (ZYPREXA) tablet 5 mg  5 mg Oral Q8H PRN Reggie Pile, MD   5 mg at 08/15/18 2234  . OLANZapine (ZYPREXA) tablet 5 mg  5 mg Oral QHS Mariel Craft, MD  5 mg at 08/21/18 2142  . pantoprazole (PROTONIX) EC tablet 40 mg  40 mg Oral Daily Reggie Pile, MD   40 mg at 08/21/18 1653  . sucralfate (CARAFATE) tablet 1 g  1 g Oral TID WC & HS Reggie Pile, MD   1 g at 08/22/18 9323   Current Outpatient Medications  Medication Sig Dispense Refill  . ARIPiprazole (ABILIFY) 20 MG tablet Take 1 tablet (20 mg total) by mouth daily. 30 tablet 0  . benztropine (COGENTIN) 2 MG tablet Take 1 tablet (2 mg total) by mouth 2 (two) times daily. 60 tablet 0  . clonazePAM (KLONOPIN) 1 MG tablet Take 1 tablet (1 mg total) by mouth 2 (two) times daily. 30 tablet 0  . divalproex (DEPAKOTE ER) 500 MG 24 hr tablet Take 2 tablets (1,000 mg total) by mouth at bedtime. 60 tablet 0  . famotidine (PEPCID) 20 MG tablet Take 1 tablet (20 mg total) by mouth 2 (two) times daily. 60 tablet 0  . fenofibrate 160 MG tablet Take 1 tablet (160 mg total) by mouth daily. 30 tablet 1  . fluvoxaMINE (LUVOX) 100 MG tablet Take 1 tablet (100 mg total) by mouth at bedtime. 30 tablet 0  . levothyroxine (SYNTHROID) 50 MCG tablet Take 1 tablet (50 mcg total) by mouth daily before breakfast. 30 tablet 1  . nitrofurantoin (MACRODANTIN) 100 MG capsule Take 1 capsule (100 mg total) by mouth 2 (two) times daily. 60 capsule 0  . OLANZapine (ZYPREXA) 5 MG tablet Take 1 tablet (5 mg total) by mouth every 8 (eight) hours as needed (agitation). 30 tablet 0  . pantoprazole (PROTONIX) 40 MG tablet Take 1 tablet (40 mg total) by mouth daily. 30 tablet 1  . sucralfate (CARAFATE) 1 g tablet Take 1 tablet  (1 g total) by mouth 4 (four) times daily -  with meals and at bedtime. 120 tablet 1    Lab Results:  No results found for this or any previous visit (from the past 48 hour(s)).  Blood Alcohol level:  Lab Results  Component Value Date   ETH <10 08/09/2018   ETH <10 08/01/2018    Metabolic Disorder Labs: Lab Results  Component Value Date   HGBA1C 4.8 08/11/2016   MPG 91 08/11/2016   Lab Results  Component Value Date   PROLACTIN 111.7 (H) 08/11/2016   PROLACTIN 66.8 (H) 06/17/2015   Lab Results  Component Value Date   CHOL 178 08/11/2016   TRIG 402 (H) 08/11/2016   HDL 13 (L) 08/11/2016   CHOLHDL 13.7 08/11/2016   VLDL UNABLE TO CALCULATE IF TRIGLYCERIDE OVER 400 mg/dL 55/73/2202   LDLCALC UNABLE TO CALCULATE IF TRIGLYCERIDE OVER 400 mg/dL 54/27/0623   LDLCALC 82 06/17/2015    Musculoskeletal: Strength & Muscle Tone: within normal limits Gait & Station: normal Patient leans: N/A  Psychiatric Specialty Exam: Physical Exam  Nursing note and vitals reviewed. Constitutional: She appears well-developed and well-nourished. No distress.  HENT:  Head: Normocephalic and atraumatic.  Eyes: EOM are normal.  Neck: Normal range of motion.  Cardiovascular: Normal rate and regular rhythm.  Respiratory: Effort normal. No respiratory distress.  Musculoskeletal: Normal range of motion.  Neurological: She is alert.  Psychiatric: Her behavior is normal. Her speech is delayed. Cognition and memory are fair. She exhibits a dysphoric mood. She denies suicidal ideation. She expresses no homicidal ideation. She expresses no suicidal plans.    Review of Systems  Constitutional: Negative Respiratory: Negative.   Cardiovascular: Negative.  Gastrointestinal: Negative.   Genitourinary: Positive for frequency and urgency.       Incontinence  Musculoskeletal: Positive for and myalgias.  Neurological: Negative.   Psychiatric/Behavioral: Positive for depression. Negative for  hallucinations, memory loss and substance abuse. The patient is nervous/anxious. The patient does not have insomnia.     Blood pressure 108/64, pulse 69, temperature 98.3 F (36.8 C), temperature source Oral, resp. rate 16, last menstrual period 05/28/2018, SpO2 97 %.There is no height or weight on file to calculate BMI.  General Appearance: Casual  Eye Contact:  Good  Speech:  Clear and Coherent  Volume:  Normal  Mood:  Dysphoric  Affect:  Blunt  Thought Process:  Coherent  Orientation:  Full (Time, Place, and Person)  Thought Content:  Logical   Suicidal Thoughts:  No  Homicidal Thoughts:  Denies  Memory:  Fair  Judgement:  Fair  Insight:  Shallow  Psychomotor Activity:  Psychomotor Retardation  Concentration:  Concentration: Fair  Recall:  Fair  Fund of Knowledge:  Fair  Language:  Good  Akathisia:  No  Handed:  Right  AIMS (if indicated):     Assets:  Financial Resources/Insurance  ADL's:  Impaired  Cognition:  Impaired,  Mild  Sleep:   Adequate    Treatment Plan Summary: Plan Of Care/Follow-up recommendations:  Daily contact with patient to assess and evaluate symptoms and progress in treatment and Medication management   Adjustment disorder with mixed disturbance of emotions and conduct: Schizoaffective disorder, depressive disorder: Patient agitated and aggressive today with refusal of medication. -Discontinued Depakote ER 1000 mg at bedtime for mood stabilization Per record review, Depakote has not been given since 08/17/2018.  Depakote level drawn August 15, 2018 was non-therapeutic.  Patient historically has non-therapeutic Depakote levels.  Will plan to discontinue this medication and maximize Zyprexa for mood stabilization and prevention of auditory and visual hallucinations which patient complains about today.  -Continue Luvox150 mg at bedtime for anxiety and depressive disorder -Continue Zyprexa 2.5 mg twice a day with morning and evening meal for mood  stabilization -Continue Zyprexa 5 mg at HS for mood stabilization and psychotic features.   Agitation: -Continue Zyprexa 5 mg every 8 hours PRN agitation -Continue Benadryl 50 mg every 8 hours PRN agitation  Anxiety: -Continue Klonopin 1 mg twice a day breakfast and dinner for anxiety  Asthma: -Continue albuterol 2 puffs every hours  EPS prevention: -Continue Cogentin 2 mg twice daily for EPS prevention, and to maximize anticholinergic effect for assistance in decreasing urinary incontinence.  UTI prevention: -Continue Macrobid 100 mg daily  Hypothyroid: -Continue Synthroid 50 mcg daily for hypothyroidism  GERD: -Continue Protonix 40 mg daily -Continue Pepcid 20 mg BID -Continue Carafate 1 gram with meals and at bedtime  Elevated triglycerides -Continue Fenofibrate 160 mg daily  Yeast infection prevention: Nystatin powder BID  We will encourage patient to makeurological/gynecology appointment after discharge.  Disposition: Continue working with her ACT team/Social work for placement in another group home setting, as patient is unlikely to be successful living independently. Patient is currently staying in emergency department to receive assistance from social work on housing.  Patient is made aware that she will need to have insured medication compliance before discharge.  Care coordinator has gotten approval for higher level of care.  Patient has 2 facilities that will be scheduling interviews with her for placement.  Mariel CraftSHEILA M MAURER, MD 08/22/2018, 9:42 AM

## 2018-08-22 NOTE — ED Notes (Signed)
Dr. Viviano Simas brought an "heart exercise" for the patient to do an the patient stated, "I don't want to do it."  Dr. Viviano Simas informed.

## 2018-08-22 NOTE — ED Notes (Signed)
Pt verbalizing anxiety and agitation since beginning of my shift - stating  "them people lie - they said the sheriff doesn't come here but I saw them two times today taking other people to different hospitals - I want to go to another hospital - like [facility] but they lied  - the sheriff can take me there"  Attempts made for her to understand that the psychiatrist has not recommended her for inpt treatment and that she is here awaiting placement into a group home where she can live safely  She states  "I am sick - I do need to be in a hospital  - I am ready to leave here  - why don;t they find me somewhere ... Can you call the sheriff for me?  Assured her that social work and her care coordinator are looking for her somewhere everyday   meds administered for agitation / anxiety per pt request  "I am going to have a nervous breakdown if I don't leave here soon"

## 2018-08-22 NOTE — ED Notes (Signed)
HS meds administered as ordered  Continue to monitor

## 2018-08-22 NOTE — ED Notes (Signed)
Pt standing in doorway of room refusing to go in room.  Cursing staff.  Calling staff, "You fucking bitch!  I want to use the phone, you black bitch!"

## 2018-08-22 NOTE — ED Notes (Signed)
Drink was given to patient at this time. 

## 2018-08-22 NOTE — ED Notes (Signed)
ED  Is the patient under IVC or is there intent for IVC:  no   Is the patient medically cleared: Yes.   Is there vacancy in the ED BHU: Yes.  Unit closed  Is the population mix appropriate for patient: Yes.   Is the patient awaiting placement in inpatient or outpatient setting: Yes.  Social work and her care coordinator consults in progress Has the patient had a psychiatric consult: Yes.   Survey of unit performed for contraband, proper placement and condition of furniture, tampering with fixtures in bathroom, shower, and each patient room: Yes.  ; Findings:  APPEARANCE/BEHAVIOR Anxious    cooperative NEURO ASSESSMENT Orientation: oriented x3  Denies pain Hallucinations: No.None noted (Hallucinations) denies Speech: Normal Gait: normal RESPIRATORY ASSESSMENT Even  Unlabored respirations  CARDIOVASCULAR ASSESSMENT Pulses equal   regular rate  Skin warm and dry   GASTROINTESTINAL ASSESSMENT no GI complaint EXTREMITIES Full ROM  PLAN OF CARE Provide calm/safe environment. Vital signs assessed twice daily. ED BHU Assessment once each 12-hour shift. Collaborate with TTS daily or as condition indicates. Assure the ED provider has rounded once each shift. Provide and encourage hygiene. Provide redirection as needed. Assess for escalating behavior; address immediately and inform ED provider.  Assess family dynamic and appropriateness for visitation as needed: Yes.  ; If necessary, describe findings:  Educate the patient/family about BHU procedures/visitation: Yes.  ; If necessary, describe findings:    She denies SI/HI

## 2018-08-22 NOTE — ED Notes (Signed)
Pt requesting to see social work.  Social work called.  Stated that they would be up to see patient as soon as possible.

## 2018-08-22 NOTE — BH Assessment (Signed)
This Clinical research associate spoke with pt's [facility] Coordinator Gari Crown, [phone]) to inquire about the status of group home placement. She reports that pt has been approved to seek a higher level group home for placement.  She also mentioned that there are 2 group homes that are interested in interviewing pt for possible placement: [facility] located in [facility] & [facility] located in [place], [place].  Pt's care coordinator said that she would call the SW dept and/or TTS to schedule virtual interviews with each group homes. She mentioned that these interviews would be either via Zoom or telephone.

## 2018-08-22 NOTE — ED Notes (Signed)
Social worker is in to see patient.

## 2018-08-22 NOTE — TOC Progression Note (Signed)
Transition of Care [facility]) - Progression Note    Patient Details  Name: [name] MRN: [id number] Date of Birth: [birth year]  [facility]) CM/SW Contact  Tania Menaal Russum, LCSW Phone Number: 08/22/2018, 2:18 PM  Clinical Narrative:    2:04pm Pt requested to see CSW. CSW provided pt with an update regarding placement. CSW was in the room when pt's care coordinator contacted TTS.  CSW informed pt that her care coordinator is trying to set up a video interview with two facilities. Pt understood, and thanked CSW for update.         Expected Discharge Plan and Services                                                 Social Determinants of Health (SDOH) Interventions    Readmission Risk Interventions No flowsheet data found.

## 2018-08-22 NOTE — ED Notes (Addendum)
BEHAVIORAL HEALTH ROUNDING Patient sleeping: No. Patient alert and oriented: yes Behavior appropriate: Yes  Anxious - agitated ; If no, describe:  Nutrition and fluids offered: yes Toileting and hygiene offered: Yes  Sitter present: q15 minute observations and security monitoring Law enforcement present: Yes  ODS  ENVIRONMENTAL ASSESSMENT Potentially harmful objects out of patient reach: Yes.   Personal belongings secured: Yes.   Patient dressed in hospital provided attire only: Yes.   Plastic bags out of patient reach: Yes.   Patient care equipment (cords, cables, call bells, lines, and drains) shortened, removed, or accounted for: Yes.   Equipment and supplies removed from bottom of stretcher: Yes.   Potentially toxic materials out of patient reach: Yes.   Sharps container removed or out of patient reach: Yes.

## 2018-08-22 NOTE — ED Notes (Signed)
Patient standing in hallway refusing to go back in room stating" no bitch"

## 2018-08-23 DIAGNOSIS — R4189 Other symptoms and signs involving cognitive functions and awareness: Secondary | ICD-10-CM | POA: Diagnosis not present

## 2018-08-23 LAB — URINALYSIS, COMPLETE (UACMP) WITH MICROSCOPIC
Bacteria, UA: NONE SEEN
Bilirubin Urine: NEGATIVE
Glucose, UA: NEGATIVE mg/dL
Hgb urine dipstick: NEGATIVE
Ketones, ur: NEGATIVE mg/dL
Nitrite: NEGATIVE
Protein, ur: NEGATIVE mg/dL
Specific Gravity, Urine: 1.012 (ref 1.005–1.030)
WBC, UA: >50 WBC/hpf — ABNORMAL HIGH (ref 0–5)
pH: 8 (ref 5.0–8.0)

## 2018-08-23 MED ORDER — ACETAMINOPHEN 325 MG PO TABS: 650.0000 mg | ORAL_TABLET | Freq: Once | ORAL | Status: DC

## 2018-08-23 NOTE — ED Notes (Signed)
Patient calling this RN a bitch. Because of calling this RN names this writer cut off tv and turned lights out and advised patient to try and go to sleep.

## 2018-08-23 NOTE — ED Notes (Signed)
BEHAVIORAL HEALTH ROUNDING Patient sleeping: Yes.   Patient alert and oriented: eyes closed  Appears to be asleep Behavior appropriate: Yes.  ; If no, describe:  Nutrition and fluids offered: Yes  Toileting and hygiene offered: sleeping Sitter present: q 15 minute observations and security monitoring Law enforcement present: yes  ODS 

## 2018-08-23 NOTE — ED Notes (Signed)
Patient taking a shower.

## 2018-08-23 NOTE — ED Notes (Signed)
Patient ate 100% of dinner

## 2018-08-23 NOTE — ED Notes (Signed)
Spoke with TTS and patient will have a possible interview for a group home on Monday Aug 26, 2018 at 1p

## 2018-08-23 NOTE — BH Assessment (Signed)
This Clinical research associate spoke with pt's [employer] Care Coordinator: Larena Sox - [phone] who requested for pt to have a virtual meeting with a potential group home placement [facility]).   A virtual Zoom meeting has been scheduled for Monday, Aug 26, 2018 at 1:00pm.   Meeting ID: [phone] Password: [email or web address]

## 2018-08-23 NOTE — ED Notes (Signed)
Patient observed lying in bed with eyes closed  Even, unlabored respirations observed   NAD pt appears to be sleeping  I will continue to monitor along with every 15 minute visual observations     

## 2018-08-23 NOTE — ED Provider Notes (Signed)
-----------------------------------------   5:41 AM on 08/23/2018 -----------------------------------------   Blood pressure 109/63, pulse 70, temperature 98.5 F (36.9 C), temperature source Oral, resp. rate 17, last menstrual period 05/28/2018, SpO2 97 %.  The patient is calm and cooperative at this time.  There have been no acute events since the last update.  Awaiting disposition plan from Behavioral Medicine And/or social work teams.   Loleta Rose, MD 08/23/18 [phone]

## 2018-08-23 NOTE — ED Notes (Signed)
Pt. Alert and oriented, warm and dry, in no distress. Pt. Denies SI, HI, and AVH. Patient asking what plan is with her and questioning if she is going down stairs. This Clinical research associate advised patient that as of now she would not be going down stairs.  Pt. Encouraged to let nursing staff know of any concerns or needs.

## 2018-08-23 NOTE — ED Notes (Signed)
VOL/Pending Placement (CSW) 

## 2018-08-24 DIAGNOSIS — R4189 Other symptoms and signs involving cognitive functions and awareness: Secondary | ICD-10-CM | POA: Diagnosis not present

## 2018-08-24 DIAGNOSIS — F250 Schizoaffective disorder, bipolar type: Secondary | ICD-10-CM | POA: Diagnosis not present

## 2018-08-24 DIAGNOSIS — F4325 Adjustment disorder with mixed disturbance of emotions and conduct: Secondary | ICD-10-CM | POA: Diagnosis not present

## 2018-08-24 DIAGNOSIS — F70 Mild intellectual disabilities: Secondary | ICD-10-CM | POA: Diagnosis not present

## 2018-08-24 DIAGNOSIS — G809 Cerebral palsy, unspecified: Secondary | ICD-10-CM | POA: Diagnosis not present

## 2018-08-24 MED ORDER — RISPERIDONE 1 MG PO TABS
2.0000 mg | ORAL_TABLET | Freq: Every day | ORAL | Status: DC
  Administered 2018-08-24 – 2018-09-15 (×22): 2 mg via ORAL
  Filled 2018-08-24 (×22): qty 2

## 2018-08-24 MED ORDER — PALIPERIDONE PALMITATE ER 234 MG/1.5ML IM SUSY
234.0000 mg | PREFILLED_SYRINGE | Freq: Once | INTRAMUSCULAR | Status: AC
  Administered 2018-08-24: 234 mg via INTRAMUSCULAR
  Filled 2018-08-24: qty 1.5

## 2018-08-24 MED ORDER — RISPERIDONE 1 MG PO TABS
0.5000 mg | ORAL_TABLET | Freq: Two times a day (BID) | ORAL | Status: DC
  Administered 2018-08-24 – 2018-08-25 (×2): 0.5 mg via ORAL
  Filled 2018-08-24 (×2): qty 1

## 2018-08-24 MED ORDER — PALIPERIDONE PALMITATE ER 156 MG/ML IM SUSY
156.0000 mg | PREFILLED_SYRINGE | INTRAMUSCULAR | Status: DC
  Administered 2018-08-28: 156 mg via INTRAMUSCULAR
  Filled 2018-08-24 (×2): qty 1

## 2018-08-24 NOTE — ED Provider Notes (Signed)
-----------------------------------------   6:24 AM on 08/24/2018 -----------------------------------------   Blood pressure 125/74, pulse 65, temperature 98.5 F (36.9 C), temperature source Oral, resp. rate 18, last menstrual period 05/28/2018, SpO2 100 %.  The patient is sleeping at this time.  There have been no acute events since the last update.  Awaiting disposition plan from social work team.   Irean Hong, MD 08/24/18 [phone]

## 2018-08-24 NOTE — ED Notes (Signed)
Patient is walking around in her room. No distress noted.

## 2018-08-24 NOTE — Progress Notes (Signed)
[facility] MD Progress Note  08/24/2018 11:25 AM [name]  MRN:  [id number] Subjective:  "I am excited about possible new group home." Principal Problem: Adjustment disorder with mixed disturbance of emotions and conduct Diagnosis: Principal Problem:   Adjustment disorder with mixed disturbance of emotions and conduct Active Problems:   Cerebral palsy (HCC)   Mild intellectual disability   GERD (gastroesophageal reflux disease)   COPD (chronic obstructive pulmonary disease) (HCC)   Schizoaffective disorder, bipolar type (HCC)   Suicidal ideation  Patient is seen, chart is reviewed Total Time spent with patient: 35 minutes   HPI:   On admission 08/08/18:  [name] is a 53 year old Caucasian female with a history of schizoaffective disorder, intellectual disability and borderline personality disorder.  She was seen in emergency department due to suicidal ideations, but once evaluated, she denied having them, and therefore she was discharged. Later on the same day, she came back expressing suicidal thoughts again, and racing thoughts.    Past Psychiatric History: Schizoaffective disorder, mild intellectual disability, multiple suicide attempts.  On evaluation, patient is awake and alert.  She has been compliant with medication and has had no behavioral issues overnight.  She is excited to learn that she has an interview with the new group home on May 11.  Patient reports that she continues to have "so-so suicidal thoughts, homicidal thoughts, and auditory and visual hallucinations."  Patient describes these as being a little worse than her usual.  She is agreeable to starting on long-acting injectable medication today, to see if that would help.  Patient states she has been on Risperdal before, because it worked well for her.  She is agreeable to [place] injection.  Patient answers questions appropriately today.  Reports she is eating and sleeping well.   Historically, she is  chronically suicidal at times especially upon arriving to the ED or when she does not want to be at her current living situation, group homes or her family  Frequently comes to the ED when she does not get what she wants.   Caveat:  Cognitive impairment with low level of frustration tolerance.   Awaiting for group home placement. Care coordinator has gotten approval for higher level of care.  Patient has 2 facilities that will be scheduling interviews with her for placement.  Past Medical History:  Past Medical History:  Diagnosis Date  . Anxiety   . Asthma   . Borderline personality disorder (HCC)   . Cerebral palsy (HCC)   . COPD (chronic obstructive pulmonary disease) (HCC)   . Depression   . GERD (gastroesophageal reflux disease)   . Mild cognitive impairment   . Schizoaffective disorder (HCC)   . Wound abscess     Past Surgical History:  Procedure Laterality Date  . CHOLECYSTECTOMY    . INCISION AND DRAINAGE ABSCESS Right 01/02/2015   Procedure: INCISION AND DRAINAGE ABSCESS;  Surgeon: Tiney Rouge III, MD;  Location: [facility] ORS;  Service: General;  Laterality: Right;  . TONSILLECTOMY     Family History:  Family History  Problem Relation Age of Onset  . Heart attack Father   . Diabetes Mother    Family Psychiatric  History: None known  Social History:  Social History   Substance and Sexual Activity  Alcohol Use No     Social History   Substance and Sexual Activity  Drug Use No    Social History   Socioeconomic History  . Marital status: Single    Spouse name: Not on  file  . Number of children: Not on file  . Years of education: Not on file  . Highest education level: Not on file  Occupational History  . Occupation: Disabled  Social Needs  . Financial resource strain: Not on file  . Food insecurity:    Worry: Not on file    Inability: Not on file  . Transportation needs:    Medical: Not on file    Non-medical: Not on file  Tobacco Use  . Smoking status:  Former Games developer  . Smokeless tobacco: Never Used  Substance and Sexual Activity  . Alcohol use: No  . Drug use: No  . Sexual activity: Not Currently  Lifestyle  . Physical activity:    Days per week: Not on file    Minutes per session: Not on file  . Stress: Not on file  Relationships  . Social connections:    Talks on phone: Not on file    Gets together: Not on file    Attends religious service: Not on file    Active member of club or organization: Not on file    Attends meetings of clubs or organizations: Not on file    Relationship status: Not on file  Other Topics Concern  . Not on file  Social History Narrative   Pt lives with mother, nephew, sister in law   Additional Social History:       Patient is essentially homeless at this time, as she previously was residing in a group home which had provided her 30-day notice. Her 30-day notice is completed.   Care coordinator has gotten approval for higher level of care.  Patient has 2 facilities that will be scheduling interviews with her for placement.   Sleep: Good  Appetite:  Good  Current Medications: Current Facility-Administered Medications  Medication Dose Route Frequency Provider Last Rate Last Dose  . acetaminophen (TYLENOL) tablet 650 mg  650 mg Oral Once Jeanmarie Plant, MD      . acetaminophen (TYLENOL) tablet 650 mg  650 mg Oral Q6H PRN Dionne Bucy, MD   650 mg at 08/19/18 0657  . acetaminophen (TYLENOL) tablet 650 mg  650 mg Oral Once Sharman Cheek, MD   Stopped at 08/23/18 1928  . benztropine (COGENTIN) tablet 2 mg  2 mg Oral BID Reggie Pile, MD   2 mg at 08/24/18 1043  . clonazePAM (KLONOPIN) tablet 1 mg  1 mg Oral BID Reggie Pile, MD   1 mg at 08/24/18 1043  . diphenhydrAMINE (BENADRYL) capsule 50 mg  50 mg Oral Q8H PRN Mariel Craft, MD   50 mg at 08/23/18 2123   Or  . diphenhydrAMINE (BENADRYL) injection 50 mg  50 mg Intramuscular Q8H PRN Mariel Craft, MD   50 mg at 08/14/18 1042  .  famotidine (PEPCID) tablet 20 mg  20 mg Oral BID Reggie Pile, MD   20 mg at 08/24/18 1042  . fenofibrate tablet 160 mg  160 mg Oral Daily Reggie Pile, MD   160 mg at 08/24/18 1044  . fluvoxaMINE (LUVOX) tablet 150 mg  150 mg Oral QHS Mariel Craft, MD   150 mg at 08/23/18 2114  . levothyroxine (SYNTHROID) tablet 50 mcg  50 mcg Oral Q0600 Reggie Pile, MD   50 mcg at 08/24/18 0901  . nitrofurantoin (MACRODANTIN) capsule 100 mg  100 mg Oral BID Reggie Pile, MD   100 mg at 08/24/18 1045  . nystatin (MYCOSTATIN/NYSTOP) topical powder   Topical  BID Emily Filbert, MD      . OLANZapine [facility]) tablet 2.5 mg  2.5 mg Oral BID WC Mariel Craft, MD   2.5 mg at 08/24/18 0902  . OLANZapine (ZYPREXA) tablet 5 mg  5 mg Oral Q8H PRN Reggie Pile, MD   5 mg at 08/15/18 2234  . OLANZapine (ZYPREXA) tablet 5 mg  5 mg Oral QHS Mariel Craft, MD   5 mg at 08/23/18 2112  . pantoprazole (PROTONIX) EC tablet 40 mg  40 mg Oral Daily Reggie Pile, MD   40 mg at 08/24/18 1042  . sucralfate (CARAFATE) tablet 1 g  1 g Oral TID WC & HS Reggie Pile, MD   1 g at 08/24/18 0902   Current Outpatient Medications  Medication Sig Dispense Refill  . ARIPiprazole (ABILIFY) 20 MG tablet Take 1 tablet (20 mg total) by mouth daily. 30 tablet 0  . benztropine (COGENTIN) 2 MG tablet Take 1 tablet (2 mg total) by mouth 2 (two) times daily. 60 tablet 0  . clonazePAM (KLONOPIN) 1 MG tablet Take 1 tablet (1 mg total) by mouth 2 (two) times daily. 30 tablet 0  . divalproex (DEPAKOTE ER) 500 MG 24 hr tablet Take 2 tablets (1,000 mg total) by mouth at bedtime. 60 tablet 0  . famotidine (PEPCID) 20 MG tablet Take 1 tablet (20 mg total) by mouth 2 (two) times daily. 60 tablet 0  . fenofibrate 160 MG tablet Take 1 tablet (160 mg total) by mouth daily. 30 tablet 1  . fluvoxaMINE (LUVOX) 100 MG tablet Take 1 tablet (100 mg total) by mouth at bedtime. 30 tablet 0  . levothyroxine (SYNTHROID) 50 MCG tablet Take 1 tablet (50 mcg  total) by mouth daily before breakfast. 30 tablet 1  . nitrofurantoin (MACRODANTIN) 100 MG capsule Take 1 capsule (100 mg total) by mouth 2 (two) times daily. 60 capsule 0  . OLANZapine (ZYPREXA) 5 MG tablet Take 1 tablet (5 mg total) by mouth every 8 (eight) hours as needed (agitation). 30 tablet 0  . pantoprazole (PROTONIX) 40 MG tablet Take 1 tablet (40 mg total) by mouth daily. 30 tablet 1  . sucralfate (CARAFATE) 1 g tablet Take 1 tablet (1 g total) by mouth 4 (four) times daily -  with meals and at bedtime. 120 tablet 1    Lab Results:  Results for orders placed or performed during the hospital encounter of 08/09/18 (from the past 48 hour(s))  Urinalysis, Complete w Microscopic     Status: Abnormal   Collection Time: 08/23/18  3:36 PM  Result Value Ref Range   Color, Urine YELLOW (A) YELLOW   APPearance CLOUDY (A) CLEAR   Specific Gravity, Urine 1.012 1.005 - 1.030   pH 8.0 5.0 - 8.0   Glucose, UA NEGATIVE NEGATIVE mg/dL   Hgb urine dipstick NEGATIVE NEGATIVE   Bilirubin Urine NEGATIVE NEGATIVE   Ketones, ur NEGATIVE NEGATIVE mg/dL   Protein, ur NEGATIVE NEGATIVE mg/dL   Nitrite NEGATIVE NEGATIVE   Leukocytes,Ua LARGE (A) NEGATIVE   RBC / HPF 11-20 0 - 5 RBC/hpf   WBC, UA >50 (H) 0 - 5 WBC/hpf   Bacteria, UA NONE SEEN NONE SEEN   Squamous Epithelial / LPF 0-5 0 - 5   WBC Clumps PRESENT    Mucus PRESENT    Non Squamous Epithelial PRESENT (A) NONE SEEN    Comment: Performed at [facility], [address], [place], [place] [address]    Blood Alcohol  level:  Lab Results  Component Value Date   ETH <10 08/09/2018   ETH <10 08/01/2018    Metabolic Disorder Labs: Lab Results  Component Value Date   HGBA1C 4.8 08/11/2016   MPG 91 08/11/2016   Lab Results  Component Value Date   PROLACTIN 111.7 (H) 08/11/2016   PROLACTIN 66.8 (H) 06/17/2015   Lab Results  Component Value Date   CHOL 178 08/11/2016   TRIG 402 (H) 08/11/2016   HDL 13 (L) 08/11/2016    CHOLHDL 13.7 08/11/2016   VLDL UNABLE TO CALCULATE IF TRIGLYCERIDE OVER 400 mg/dL 16/01/9603   LDLCALC UNABLE TO CALCULATE IF TRIGLYCERIDE OVER 400 mg/dL 54/12/8117   LDLCALC 82 06/17/2015    Musculoskeletal: Strength & Muscle Tone: within normal limits Gait & Station: normal Patient leans: N/A  Psychiatric Specialty Exam: Physical Exam  Nursing note and vitals reviewed. Constitutional: She appears well-developed and well-nourished. No distress.  HENT:  Head: Normocephalic and atraumatic.  Eyes: EOM are normal.  Neck: Normal range of motion.  Cardiovascular: Normal rate and regular rhythm.  Respiratory: Effort normal. No respiratory distress.  Musculoskeletal: Normal range of motion.  Neurological: She is alert.  Psychiatric: Her behavior is normal. Her speech is clear and coherent with normal rate, tone, and volume. Cognition and memory are fair. She exhibits a euthymic mood. She expresses vague suicidal ideation, and homicidal ideation. She expresses no suicidal or homicidal plans or intent   Review of Systems  Constitutional: Negative Respiratory: Negative.   Cardiovascular: Negative.   Gastrointestinal: Negative.   Genitourinary: Positive for frequency and urgency.       Incontinence  Musculoskeletal: Negative Neurological: Negative.   Psychiatric/Behavioral: Vague auditory and visual hallucinations.  No hi this is Dr. Rebecca Eaton okay no worries memory loss and substance abuse. The patient is nervous/anxious. The patient does not have insomnia.     Blood pressure 125/74, pulse 65, temperature 98.5 F (36.9 C), temperature source Oral, resp. rate 18, last menstrual period 05/28/2018, SpO2 100 %.There is no height or weight on file to calculate BMI.  General Appearance: Casual  Eye Contact:  Good  Speech:  Clear and Coherent  Volume:  Normal  Mood:  Euthymic  Affect:  Appropriate  Thought Process:  Coherent  Orientation:  Full (Time, Place, and Person)  Thought Content:   Logical   Suicidal Thoughts:  No  Homicidal Thoughts:  Denies  Memory:  Fair  Judgement:  Fair  Insight:  Shallow  Psychomotor Activity:  Normal  Concentration:  Concentration: Fair  Recall:  Fair  Fund of Knowledge:  Fair  Language:  Good  Akathisia:  No  Handed:  Right  AIMS (if indicated):     Assets:  Financial Resources/Insurance  ADL's:  Impaired  Cognition:  Impaired,  Mild  Sleep:   Adequate    Treatment Plan Summary: Plan Of Care/Follow-up recommendations:  Daily contact with patient to assess and evaluate symptoms and progress in treatment and Medication management   Adjustment disorder with mixed disturbance of emotions and conduct: Schizoaffective disorder, depressive disorder: Patient is requesting long acting injectable antipsychotic for mood stabilization.  Will dose Risperdal 0.5 mg daily with meals and 2 mg at bedtime to replace Zyprexa dosing. Start Invega Sustenna 234 mg intramuscularly today followed by 156 mg on Aug 28, 2018 then dose to be determined by outpatient psychiatrist and provided to patient every 28 days. Patient is provided with information regarding risks, benefits, side effects, adverse effects of medication.  Allow patient time  to ask questions, and all questions answered regarding change in medication. -Continue Luvox150 mg at bedtime for anxiety and depressive disorder Discontinue Zyprexa 2.5 mg twice a day with morning and evening meal and Zyprexa 5 mg  for mood stabilization, to be replaced with Risperdal and [place] Sustenna  Agitation: Change to as needed Risperdal 1 mg every 8 hours as needed for agitation, in order to maintain single agent. -Continue Benadryl 50 mg every 8 hours PRN agitation  Anxiety: -Continue Klonopin 1 mg twice a day breakfast and dinner for anxiety  Asthma: -Continue albuterol 2 puffs every hours  EPS prevention: -Continue Cogentin 2 mg twice daily for EPS prevention, and to maximize anticholinergic effect  for assistance in decreasing urinary incontinence.  UTI prevention: -Continue Macrobid 100 mg daily  Hypothyroid: -Continue Synthroid 50 mcg daily for hypothyroidism  GERD: -Continue Protonix 40 mg daily -Continue Pepcid 20 mg BID -Continue Carafate 1 gram with meals and at bedtime  Elevated triglycerides -Continue Fenofibrate 160 mg daily  Yeast infection prevention: Nystatin powder BID  We will encourage patient to makeurological/gynecology appointment after discharge.  Disposition: Continue working with her ACT team/Social work for placement in another group home setting, as patient is unlikely to be successful living independently. Patient is currently staying in emergency department to receive assistance from social work on housing.  Patient is made aware that she will need to have insured medication compliance before discharge.  Care coordinator has gotten approval for higher level of care.  Patient has 2 facilities that will be scheduling interviews with her for placement.  Patient is scheduled for a virtual meeting with a potential group home placement [facility]  A virtual Zoom meeting has been scheduled for Monday, Aug 26, 2018 at 1:00pm.   Meeting ID: [phone] Password: [email or web address]    Mariel CraftSHEILA M MAURER, MD 08/24/2018, 11:25 AM

## 2018-08-24 NOTE — ED Notes (Signed)
The ends of patient's scrubs were cut off for safety. Patient ambulated to and from hallway bathroom with a steady gait.

## 2018-08-24 NOTE — ED Notes (Signed)
Patient is excited to have an interview on Monday 08/26/2018 with a group home.

## 2018-08-24 NOTE — ED Notes (Signed)
Patient is taking a shower at this time. NAD.

## 2018-08-24 NOTE — ED Notes (Signed)
Report given to Laura C RN 

## 2018-08-24 NOTE — ED Notes (Signed)
Patient c/o chest pain that feels like it's "caved in." Patient has good color, skin is dry. No dyspnea noted. Patient ambulated to the shower with a steady gait. Patient had been incontinent to urine during the night. Patient took morning meds without difficulty.

## 2018-08-24 NOTE — ED Provider Notes (Signed)
-----------------------------------------   10:23 AM on 08/24/2018 -----------------------------------------  The patient reported some chest pain, however she has continued to appear comfortable.  She is ambulating around the room and was able to eat a meal.  I obtained a repeat EKG which shows no significant changes from prior EKG on 07/09/2018.  The patient is in no active distress.  She remains stable.  ED ECG REPORT I, Dionne Bucy, the attending physician, personally viewed and interpreted this ECG.  Date: 08/24/2018 EKG Time: 1021 Rate: 71 Rhythm: normal sinus rhythm QRS Axis: normal Intervals: normal ST/T Wave abnormalities: Nonspecific T wave flattening/inversion inferior and anterior Narrative Interpretation: Nonspecific T wave abnormalities with no evidence of acute ischemia; no significant change when compared to EKG of 07/09/2018   Dionne Bucy, MD 08/24/18 1025

## 2018-08-24 NOTE — ED Notes (Signed)
voluntary 

## 2018-08-25 DIAGNOSIS — F4325 Adjustment disorder with mixed disturbance of emotions and conduct: Secondary | ICD-10-CM | POA: Diagnosis not present

## 2018-08-25 DIAGNOSIS — F70 Mild intellectual disabilities: Secondary | ICD-10-CM | POA: Diagnosis not present

## 2018-08-25 DIAGNOSIS — G809 Cerebral palsy, unspecified: Secondary | ICD-10-CM | POA: Diagnosis not present

## 2018-08-25 DIAGNOSIS — R4189 Other symptoms and signs involving cognitive functions and awareness: Secondary | ICD-10-CM | POA: Diagnosis not present

## 2018-08-25 DIAGNOSIS — F250 Schizoaffective disorder, bipolar type: Secondary | ICD-10-CM | POA: Diagnosis not present

## 2018-08-25 MED ORDER — PHENAZOPYRIDINE HCL 100 MG PO TABS
100.0000 mg | ORAL_TABLET | Freq: Three times a day (TID) | ORAL | Status: DC
  Administered 2018-08-25 – 2018-09-01 (×15): 100 mg via ORAL
  Filled 2018-08-25 (×22): qty 1

## 2018-08-25 MED ORDER — RISPERIDONE 1 MG PO TABS
2.0000 mg | ORAL_TABLET | Freq: Once | ORAL | Status: AC
  Administered 2018-08-25: 11:00:00 2 mg via ORAL
  Filled 2018-08-25: qty 2

## 2018-08-25 MED ORDER — LORAZEPAM 1 MG PO TABS
1.0000 mg | ORAL_TABLET | Freq: Two times a day (BID) | ORAL | Status: DC | PRN
  Administered 2018-09-08 – 2018-09-15 (×2): 1 mg via ORAL
  Filled 2018-08-25 (×2): qty 1

## 2018-08-25 MED ORDER — LORAZEPAM 1 MG PO TABS
1.0000 mg | ORAL_TABLET | Freq: Once | ORAL | Status: AC
  Administered 2018-08-25: 15:00:00 1 mg via ORAL
  Filled 2018-08-25: qty 1

## 2018-08-25 MED ORDER — IBUPROFEN 800 MG PO TABS
800.0000 mg | ORAL_TABLET | Freq: Once | ORAL | Status: AC
  Administered 2018-08-25: 800 mg via ORAL
  Filled 2018-08-25: qty 1

## 2018-08-25 MED ORDER — RISPERIDONE 1 MG PO TABS
1.0000 mg | ORAL_TABLET | Freq: Two times a day (BID) | ORAL | Status: DC
  Administered 2018-08-25 – 2018-09-16 (×34): 1 mg via ORAL
  Filled 2018-08-25 (×35): qty 1

## 2018-08-25 MED ORDER — LORAZEPAM 2 MG/ML IJ SOLN: 2.0000 mg | Freq: Two times a day (BID) | INTRAMUSCULAR | Status: DC | PRN

## 2018-08-25 NOTE — ED Notes (Signed)
Hourly rounding reveals patient in room. Stable, in no acute distress. Q15 minute rounds and monitoring via Rover and Officer to continue.  

## 2018-08-25 NOTE — ED Notes (Signed)
Report to include Situation, Background, Assessment, and Recommendations received from [facility]. Patient alert and oriented, warm and dry, in no acute distress. Patient denies pain. Patient states she has SI without plan, HI towards [facility] staff and Hearing voices without command and seeing "things" as well. Patient made aware of Q15 minute rounds and Psychologist, counselling presence for their safety. Patient instructed to come to me with needs or concerns.

## 2018-08-25 NOTE — ED Notes (Signed)
voluntary 

## 2018-08-25 NOTE — ED Notes (Signed)
Patient appears to be calmer, and sitting on bed in her room, informed patient that she cant keep calling staffs name, for every little thing, asked her to try and call us only if its emergent, reminded patient staff is monitoring her every 15 minutes.

## 2018-08-25 NOTE — ED Notes (Signed)
Hourly rounding reveals patient in room. No complaints, stable, in no acute distress. Q15 minute rounds and monitoring via Rover and Officer to continue.   

## 2018-08-25 NOTE — Progress Notes (Signed)
Pt is a readmit. She expressed anxiety about her living situation, she said if she didn't qualify for group home that she would end of homeless. She grieved about family dynamics. Chaplain tried to calm her down, but she said she cannot stop being anxious because there is where her mind is all the time. Chaplain later got called to another visit.   08/25/18 0500  Clinical Encounter Type  Visited With Patient  Visit Type Initial;Spiritual support;Social support;ED  Referral From Nurse  Spiritual Encounters  Spiritual Needs Emotional;Grief support;Other (Comment)  Stress Factors  Patient Stress Factors Family relationships;Health changes;Lack of caregivers;Loss;Major life changes;Other (Comment)

## 2018-08-25 NOTE — ED Provider Notes (Signed)
-----------------------------------------   6:12 AM on 08/25/2018 -----------------------------------------   Blood pressure 121/82, pulse 69, temperature 97.9 F (36.6 C), resp. rate 18, last menstrual period 05/28/2018, SpO2 96 %.  The patient is calm and cooperative at this time.  There have been no acute events since the last update.  Awaiting disposition plan from Behavioral Medicine team.    Minna Antis, MD 08/25/18 [phone]

## 2018-08-25 NOTE — ED Notes (Signed)
After speaking with Dr. Viviano Simas, patient given Ibuprofen 800mg  for right arm pain, where she had her Invega injection on 08/24/2018. Patient also given 2 mg Risperdal PO for noises and voices she says she is hearing in the walls in her room. She wants to be in the hallway bed.
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
## 2018-08-25 NOTE — ED Notes (Signed)
BEHAVIORAL HEALTH ROUNDING Patient sleeping: No. Patient alert and oriented: yes Behavior appropriate: Yes.  ; If no, describe:  Nutrition and fluids offered: yes Toileting and hygiene offered: Yes  Sitter present: q15 minute observations and security monitoring Law enforcement present: Yes    

## 2018-08-25 NOTE — ED Notes (Addendum)
Patient upset with staff because they will no give her, her belongings patient does not understand she is having an interview at 1p and she may not be leaving. When writer told patient she couldn't look right now for her clothing patient said "fuck you, bitch" Writer told her staff will not help her if she keeps talking to them like that and being mean

## 2018-08-25 NOTE — ED Notes (Signed)
Pt keeps walking out in the hallway, cursing at the staff. Encouraged to return to her room. Pt stating "No". Redirected back to room several times. Will give prn meds.

## 2018-08-25 NOTE — Progress Notes (Signed)
[facility] MD Progress Note  08/25/2018 9:39 AM [name]  MRN:  [id number] Subjective:  "I am so nervous!" Principal Problem: Adjustment disorder with mixed disturbance of emotions and conduct Diagnosis: Principal Problem:   Adjustment disorder with mixed disturbance of emotions and conduct Active Problems:   Cerebral palsy (HCC)   Mild intellectual disability   GERD (gastroesophageal reflux disease)   COPD (chronic obstructive pulmonary disease) (HCC)   Schizoaffective disorder, bipolar type (HCC)   Suicidal ideation  Patient is seen, chart is reviewed Total Time spent with patient: 35 minutes   HPI:   On admission 08/08/18:  [name] is a 53 year old Caucasian female with a history of schizoaffective disorder, intellectual disability and borderline personality disorder.  She was seen in emergency department due to suicidal ideations, but once evaluated, she denied having them, and therefore she was discharged. Later on the same day, she came back expressing suicidal thoughts again, and racing thoughts.    Past Psychiatric History: Schizoaffective disorder, mild intellectual disability, multiple suicide attempts.  On evaluation, patient is awake and alert.  She has been compliant with medication and has had no behavioral issues overnight.  She is anxious about her meeting tomorrow and is now reporting somatic complaints.  Patient reports that she took the [place] injection yesterday, and has some right deltoid tenderness.  She reports that her SI, HI, AVH are "so-so, a little less, and reports seeing a black man in a black coat moving around her room as well as hearing murmuring sounds. Reports she is eating and sleeping well.   Historically, she is chronically suicidal at times especially upon arriving to the ED or when she does not want to be at her current living situation, group homes or her family  Frequently comes to the ED when she does not get what she wants.   Caveat:   Cognitive impairment with low level of frustration tolerance.   Awaiting for group home placement. Care coordinator has gotten approval for higher level of care.  Patient has 2 facilities that will be scheduling interviews with her for placement.  Past Medical History:  Past Medical History:  Diagnosis Date  . Anxiety   . Asthma   . Borderline personality disorder (HCC)   . Cerebral palsy (HCC)   . COPD (chronic obstructive pulmonary disease) (HCC)   . Depression   . GERD (gastroesophageal reflux disease)   . Mild cognitive impairment   . Schizoaffective disorder (HCC)   . Wound abscess     Past Surgical History:  Procedure Laterality Date  . CHOLECYSTECTOMY    . INCISION AND DRAINAGE ABSCESS Right 01/02/2015   Procedure: INCISION AND DRAINAGE ABSCESS;  Surgeon: Tiney Rouge III, MD;  Location: [facility] ORS;  Service: General;  Laterality: Right;  . TONSILLECTOMY     Family History:  Family History  Problem Relation Age of Onset  . Heart attack Father   . Diabetes Mother    Family Psychiatric  History: None known  Social History:  Social History   Substance and Sexual Activity  Alcohol Use No     Social History   Substance and Sexual Activity  Drug Use No    Social History   Socioeconomic History  . Marital status: Single    Spouse name: Not on file  . Number of children: Not on file  . Years of education: Not on file  . Highest education level: Not on file  Occupational History  . Occupation: Disabled  Social Needs  .  Financial resource strain: Not on file  . Food insecurity:    Worry: Not on file    Inability: Not on file  . Transportation needs:    Medical: Not on file    Non-medical: Not on file  Tobacco Use  . Smoking status: Former Games developermoker  . Smokeless tobacco: Never Used  Substance and Sexual Activity  . Alcohol use: No  . Drug use: No  . Sexual activity: Not Currently  Lifestyle  . Physical activity:    Days per week: Not on file    Minutes per  session: Not on file  . Stress: Not on file  Relationships  . Social connections:    Talks on phone: Not on file    Gets together: Not on file    Attends religious service: Not on file    Active member of club or organization: Not on file    Attends meetings of clubs or organizations: Not on file    Relationship status: Not on file  Other Topics Concern  . Not on file  Social History Narrative   Pt lives with mother, nephew, sister in law   Additional Social History:       Patient is essentially homeless at this time, as she previously was residing in a group home which had provided her 30-day notice. Her 30-day notice is completed.   Care coordinator has gotten approval for higher level of care.  Patient has 2 facilities that will be scheduling interviews with her for placement.   Sleep: Good  Appetite:  Good  Current Medications: Current Facility-Administered Medications  Medication Dose Route Frequency Provider Last Rate Last Dose  . acetaminophen (TYLENOL) tablet 650 mg  650 mg Oral Once Jeanmarie PlantMcShane, James A, MD      . acetaminophen (TYLENOL) tablet 650 mg  650 mg Oral Q6H PRN Dionne BucySiadecki, Sebastian, MD   650 mg at 08/19/18 0657  . acetaminophen (TYLENOL) tablet 650 mg  650 mg Oral Once Sharman CheekStafford, Phillip, MD   Stopped at 08/23/18 1928  . benztropine (COGENTIN) tablet 2 mg  2 mg Oral BID Reggie PileJoshi, Anand, MD   2 mg at 08/24/18 2126  . clonazePAM (KLONOPIN) tablet 1 mg  1 mg Oral BID Reggie PileJoshi, Anand, MD   1 mg at 08/24/18 2126  . diphenhydrAMINE (BENADRYL) capsule 50 mg  50 mg Oral Q8H PRN Mariel CraftMaurer, Jodene Polyak M, MD   50 mg at 08/23/18 2123   Or  . diphenhydrAMINE (BENADRYL) injection 50 mg  50 mg Intramuscular Q8H PRN Mariel CraftMaurer, Vedh Ptacek M, MD   50 mg at 08/24/18 2357  . famotidine (PEPCID) tablet 20 mg  20 mg Oral BID Reggie PileJoshi, Anand, MD   20 mg at 08/24/18 2127  . fenofibrate tablet 160 mg  160 mg Oral Daily Reggie PileJoshi, Anand, MD   160 mg at 08/24/18 1044  . fluvoxaMINE (LUVOX) tablet 150 mg  150 mg Oral  QHS Mariel CraftMaurer, Quenisha Lovins M, MD   150 mg at 08/24/18 2129  . levothyroxine (SYNTHROID) tablet 50 mcg  50 mcg Oral Q0600 Reggie PileJoshi, Anand, MD   50 mcg at 08/25/18 [id number]  . nitrofurantoin (MACRODANTIN) capsule 100 mg  100 mg Oral BID Reggie PileJoshi, Anand, MD   100 mg at 08/24/18 2128  . nystatin (MYCOSTATIN/NYSTOP) topical powder   Topical BID Emily FilbertWilliams, Jonathan E, MD      . Melene Muller[START ON 08/28/2018] paliperidone (INVEGA SUSTENNA) injection 156 mg  156 mg Intramuscular Q28 days Mariel CraftMaurer, Kainen Struckman M, MD      . pantoprazole (  PROTONIX) EC tablet 40 mg  40 mg Oral Daily Reggie Pile, MD   40 mg at 08/24/18 1042  . risperiDONE (RISPERDAL) tablet 0.5 mg  0.5 mg Oral BID WC Mariel Craft, MD   0.5 mg at 08/24/18 1754  . risperiDONE (RISPERDAL) tablet 2 mg  2 mg Oral QHS Mariel Craft, MD   2 mg at 08/24/18 2127  . sucralfate (CARAFATE) tablet 1 g  1 g Oral TID WC & HS Reggie Pile, MD   1 g at 08/24/18 2127   Current Outpatient Medications  Medication Sig Dispense Refill  . ARIPiprazole (ABILIFY) 20 MG tablet Take 1 tablet (20 mg total) by mouth daily. 30 tablet 0  . benztropine (COGENTIN) 2 MG tablet Take 1 tablet (2 mg total) by mouth 2 (two) times daily. 60 tablet 0  . clonazePAM (KLONOPIN) 1 MG tablet Take 1 tablet (1 mg total) by mouth 2 (two) times daily. 30 tablet 0  . divalproex (DEPAKOTE ER) 500 MG 24 hr tablet Take 2 tablets (1,000 mg total) by mouth at bedtime. 60 tablet 0  . famotidine (PEPCID) 20 MG tablet Take 1 tablet (20 mg total) by mouth 2 (two) times daily. 60 tablet 0  . fenofibrate 160 MG tablet Take 1 tablet (160 mg total) by mouth daily. 30 tablet 1  . fluvoxaMINE (LUVOX) 100 MG tablet Take 1 tablet (100 mg total) by mouth at bedtime. 30 tablet 0  . levothyroxine (SYNTHROID) 50 MCG tablet Take 1 tablet (50 mcg total) by mouth daily before breakfast. 30 tablet 1  . nitrofurantoin (MACRODANTIN) 100 MG capsule Take 1 capsule (100 mg total) by mouth 2 (two) times daily. 60 capsule 0  . OLANZapine (ZYPREXA) 5  MG tablet Take 1 tablet (5 mg total) by mouth every 8 (eight) hours as needed (agitation). 30 tablet 0  . pantoprazole (PROTONIX) 40 MG tablet Take 1 tablet (40 mg total) by mouth daily. 30 tablet 1  . sucralfate (CARAFATE) 1 g tablet Take 1 tablet (1 g total) by mouth 4 (four) times daily -  with meals and at bedtime. 120 tablet 1    Lab Results:  Results for orders placed or performed during the hospital encounter of 08/09/18 (from the past 48 hour(s))  Urinalysis, Complete w Microscopic     Status: Abnormal   Collection Time: 08/23/18  3:36 PM  Result Value Ref Range   Color, Urine YELLOW (A) YELLOW   APPearance CLOUDY (A) CLEAR   Specific Gravity, Urine 1.012 1.005 - 1.030   pH 8.0 5.0 - 8.0   Glucose, UA NEGATIVE NEGATIVE mg/dL   Hgb urine dipstick NEGATIVE NEGATIVE   Bilirubin Urine NEGATIVE NEGATIVE   Ketones, ur NEGATIVE NEGATIVE mg/dL   Protein, ur NEGATIVE NEGATIVE mg/dL   Nitrite NEGATIVE NEGATIVE   Leukocytes,Ua LARGE (A) NEGATIVE   RBC / HPF 11-20 0 - 5 RBC/hpf   WBC, UA >50 (H) 0 - 5 WBC/hpf   Bacteria, UA NONE SEEN NONE SEEN   Squamous Epithelial / LPF 0-5 0 - 5   WBC Clumps PRESENT    Mucus PRESENT    Non Squamous Epithelial PRESENT (A) NONE SEEN    Comment: Performed at [facility], [address], [place], [place] [address]    Blood Alcohol level:  Lab Results  Component Value Date   [facility] <10 08/09/2018   ETH <10 08/01/2018    Metabolic Disorder Labs: Lab Results  Component Value Date   HGBA1C 4.8 08/11/2016  MPG 91 08/11/2016   Lab Results  Component Value Date   PROLACTIN 111.7 (H) 08/11/2016   PROLACTIN 66.8 (H) 06/17/2015   Lab Results  Component Value Date   CHOL 178 08/11/2016   TRIG 402 (H) 08/11/2016   HDL 13 (L) 08/11/2016   CHOLHDL 13.7 08/11/2016   VLDL UNABLE TO CALCULATE IF TRIGLYCERIDE OVER 400 mg/dL 09/81/1914   LDLCALC UNABLE TO CALCULATE IF TRIGLYCERIDE OVER 400 mg/dL 78/29/5621   LDLCALC 82 06/17/2015     Musculoskeletal: Strength & Muscle Tone: within normal limits Gait & Station: normal Patient leans: N/A  Psychiatric Specialty Exam: Physical Exam  Nursing note and vitals reviewed. Constitutional: She appears well-developed and well-nourished. No distress.  HENT:  Head: Normocephalic and atraumatic.  Eyes: EOM are normal.  Neck: Normal range of motion.  Cardiovascular: Normal rate and regular rhythm.  Respiratory: Effort normal. No respiratory distress.  Musculoskeletal: Normal range of motion.  Neurological: She is alert.  Psychiatric: Her behavior is normal. Her speech is clear and coherent with normal rate, tone, and volume. Cognition and memory are fair. She exhibits a euthymic, but anxious mood. She expresses vague suicidal ideation, and homicidal ideation. She expresses no suicidal or homicidal plans or intent   Review of Systems  Constitutional: Negative Respiratory: Negative.   Cardiovascular: Negative.   Gastrointestinal: Negative.   Genitourinary: Positive for frequency and urgency.  Patient reports dysuria.      Incontinence  Musculoskeletal: Negative Neurological: Negative.   Psychiatric/Behavioral: Vague auditory and visual hallucinations.  No hi this is Dr. Rebecca Eaton okay no worries memory loss and substance abuse. The patient is nervous/anxious. The patient does not have insomnia.     Blood pressure (!) 100/57, pulse 77, temperature 98.4 F (36.9 C), temperature source Oral, resp. rate 18, last menstrual period 05/28/2018, SpO2 96 %.There is no height or weight on file to calculate BMI.  General Appearance: Casual  Eye Contact:  Good  Speech:  Clear and Coherent  Volume:  Normal  Mood:  Anxious and Euthymic  Affect:  Appropriate  Thought Process:  Coherent  Orientation:  Full (Time, Place, and Person)  Thought Content:  Logical   Suicidal Thoughts:  No  Homicidal Thoughts:  Denies  Memory:  Fair  Judgement:  Fair  Insight:  Shallow  Psychomotor  Activity:  Normal  Concentration:  Concentration: Fair  Recall:  Fair  Fund of Knowledge:  Fair  Language:  Good  Akathisia:  No  Handed:  Right  AIMS (if indicated):     Assets:  Financial Resources/Insurance  ADL's:  Impaired  Cognition:  Impaired,  Mild  Sleep:   Adequate    Treatment Plan Summary: Plan Of Care/Follow-up recommendations:  Daily contact with patient to assess and evaluate symptoms and progress in treatment and Medication management Adjustment disorder with mixed disturbance of emotions and conduct; Schizoaffective disorder, depressive disorder: Provide additional 2 mg of Risperdal this morning for worsening auditory and visual hallucinations. Increase dose Risperdal 1 mg daily with morning and evening meals and 2 mg  Invega Sustenna 234 mg intramuscularly 08/24/2018  To be followed by 156 mg on Aug 28, 2018 then dose to be determined by outpatient psychiatrist and provided to patient every 28 days. -Continue Luvox150 mg at bedtime for ruminations, anxiety and depressive disorder  Agitation: Change to as needed Risperdal 1 mg every 8 hours as needed for agitation, in order to maintain single agent. -Continue Benadryl 50 mg every 8 hours PRN agitation  Anxiety: -Continue Klonopin  1 mg twice a day breakfast and dinner for anxiety Ativan 1 mg twice daily as needed for anxiety or sleep prior to patient's interview for group home placement.  Asthma: -Continue albuterol 2 puffs every hours  EPS prevention: -Continue Cogentin 2 mg twice daily for EPS prevention, and to maximize anticholinergic effect for assistance in decreasing urinary incontinence.  UTI prevention: -Continue Macrobid 100 mg daily -Reviewed UA from 08/23/2018, nonspecific for UTI.  Patient will provide sample for UA/urine culture today. -Pyridium 100 mg 3 times daily with meals for complaints of dysuria.  Hypothyroid: -Continue Synthroid 50 mcg daily for hypothyroidism  GERD: -Continue  Protonix 40 mg daily -Continue Pepcid 20 mg BID -Continue Carafate 1 gram with meals and at bedtime  Elevated triglycerides -Continue Fenofibrate 160 mg daily  Yeast infection prevention: Nystatin powder BID  We will encourage patient to makeurological/gynecology appointment after discharge.  Disposition: Continue working with her ACT team/Social work for placement in another group home setting, as patient is unlikely to be successful living independently. Patient is currently staying in emergency department to receive assistance from social work on housing.  Patient is made aware that she will need to have insured medication compliance before discharge.  Care coordinator has gotten approval for higher level of care.  Patient has 2 facilities that will be scheduling interviews with her for placement.  Patient is scheduled for a virtual meeting with a potential group home placement [facility]).  A virtual Zoom meeting has been scheduled for Monday, Aug 26, 2018 at 1:00pm.   Meeting ID: [id number] Password: [email or web address]    Mariel Craft, MD 08/25/2018, 9:39 AM

## 2018-08-26 DIAGNOSIS — G809 Cerebral palsy, unspecified: Secondary | ICD-10-CM | POA: Diagnosis not present

## 2018-08-26 DIAGNOSIS — F250 Schizoaffective disorder, bipolar type: Secondary | ICD-10-CM | POA: Diagnosis not present

## 2018-08-26 DIAGNOSIS — R4189 Other symptoms and signs involving cognitive functions and awareness: Secondary | ICD-10-CM | POA: Diagnosis not present

## 2018-08-26 DIAGNOSIS — F70 Mild intellectual disabilities: Secondary | ICD-10-CM | POA: Diagnosis not present

## 2018-08-26 DIAGNOSIS — F4325 Adjustment disorder with mixed disturbance of emotions and conduct: Secondary | ICD-10-CM | POA: Diagnosis not present

## 2018-08-26 MED ORDER — NITROFURANTOIN MACROCRYSTAL 50 MG PO CAPS
100.0000 mg | ORAL_CAPSULE | Freq: Every day | ORAL | Status: DC
  Administered 2018-08-26 – 2018-09-15 (×19): 100 mg via ORAL
  Filled 2018-08-26 (×23): qty 2

## 2018-08-26 NOTE — ED Notes (Signed)
Report to include Situation, Background, Assessment, and Recommendations received from [facility]. Patient alert and oriented, warm and dry, in no acute distress. Patient made aware of Q15 minute rounds and Psychologist, counselling presence for their safety. Patient instructed to come to me with needs or concerns.

## 2018-08-26 NOTE — ED Provider Notes (Signed)
-----------------------------------------   6:23 AM on 08/26/2018 -----------------------------------------   Blood pressure (!) 131/92, pulse (!) 104, temperature 97.9 F (36.6 C), temperature source Oral, resp. rate 20, last menstrual period 05/28/2018, SpO2 97 %.  The patient is calm and cooperative at this time.  There have been no acute events since the last update.  Awaiting disposition plan from Behavioral Medicine team.   Irean Hong, MD 08/26/18 [phone]

## 2018-08-26 NOTE — ED Notes (Signed)
Pt up to the bathroom at this time.

## 2018-08-26 NOTE — ED Notes (Signed)
Hourly rounding reveals patient in room. No complaints, stable, in no acute distress. Q15 minute rounds and monitoring via Rover and Officer to continue.   

## 2018-08-26 NOTE — ED Notes (Signed)
BEHAVIORAL HEALTH ROUNDING Patient sleeping: Yes.   Patient alert and oriented: eyes closed  Appears to be asleep Behavior appropriate: Yes.  ; If no, describe:  Nutrition and fluids offered: Yes  Toileting and hygiene offered: sleeping Sitter present: q 15 minute observations and security monitoring Law enforcement present: yes  ODS 

## 2018-08-26 NOTE — ED Notes (Addendum)
ED  Is the patient under IVC or is there intent for IVC:  No voluntary  Is the patient medically cleared: Yes.   Is there vacancy in the ED BHU: Yes.   Is the population mix appropriate for patient: Yes.   Is the patient awaiting placement in inpatient or outpatient setting: Yes.  She is awaiting placement into a group home - social work and her care coordinator consults in progress    Has the patient had a psychiatric consult: Yes.   Survey of unit performed for contraband, proper placement and condition of furniture, tampering with fixtures in bathroom, shower, and each patient room: Yes.  ; Findings:  APPEARANCE/BEHAVIOR  cooperative NEURO ASSESSMENT Orientation: oriented x3  Denies pain Hallucinations: No.None noted (Hallucinations)  denies Speech: Normal Gait: normal RESPIRATORY ASSESSMENT Even  Unlabored respirations  CARDIOVASCULAR ASSESSMENT Pulses equal   regular rate  Skin warm and dry   GASTROINTESTINAL ASSESSMENT no GI complaint EXTREMITIES Full ROM  PLAN OF CARE Provide calm/safe environment. Vital signs assessed twice daily. ED BHU Assessment once each 12-hour shift. Collaborate with TTS when available or as condition indicates. Assure the ED provider has rounded once each shift. Provide and encourage hygiene. Provide redirection as needed. Assess for escalating behavior; address immediately and inform ED provider.  Assess family dynamic and appropriateness for visitation as needed: Yes.  ; If necessary, describe findings:  Educate the patient/family about BHU procedures/visitation: Yes.  ; If necessary, describe findings:

## 2018-08-26 NOTE — ED Notes (Signed)
Lunch placed at bedside.  Pt sleeping.

## 2018-08-26 NOTE — TOC Progression Note (Addendum)
Transition of Care [facility]) - Progression Note    Patient Details  Name: [name] MRN: [id number] Date of Birth: 12-25-1965  Transition of [facility]) CM/SW Contact  Tania Francia Verry, LCSW Phone Number: 08/26/2018, 10:21 AM  Clinical Narrative:    10:15am Patient's care coordinator, Larena Sox updated TTS and CSW regarding group home placement. Pt will have a video interview with one group home at 1:00pm, and a phone interview with another group home at 2:00pm.     1:08pm - Pt had video interview with a group home. CSW was present during interview. CSW provided group home staff with her contact information to be notified once they make a decision.   2:00pm - Pt had phone interview with [facility]. CSW was present during interview. CSW provided group home staff with psychiatrist phone number to receive additional information.   CSW informed pt that both group homes have to review the interview questions and additional information before making a final decision.     Expected Discharge Plan and Services                                                 Social Determinants of Health (SDOH) Interventions    Readmission Risk Interventions No flowsheet data found.

## 2018-08-26 NOTE — ED Notes (Signed)
BEHAVIORAL HEALTH ROUNDING Patient sleeping: Yes.   Patient alert and oriented: eyes closed  Appears asleep Behavior appropriate: Yes.  ; If no, describe:  Nutrition and fluids offered: Yes  Toileting and hygiene offered: sleeping Sitter present: q 15 minute observations and security monitoring Law enforcement present: yes  ODS 

## 2018-08-26 NOTE — ED Notes (Signed)
Pt awakened so that she can interview with a group home  - TTS and social work is at bedside

## 2018-08-26 NOTE — ED Notes (Signed)
Hourly rounding reveals patient in room. Stable, in no acute distress. Q15 minute rounds and monitoring via Rover and Officer to continue.  

## 2018-08-26 NOTE — Progress Notes (Signed)
[facility]  08/26/2018 9:47 AM Tempie DonningKimberly Shinsato  MRN:  [id number] Subjective:  "I am so nervous!" Principal Problem: Adjustment disorder with mixed disturbance of emotions and conduct Diagnosis: Principal Problem:   Adjustment disorder with mixed disturbance of emotions and conduct Active Problems:   Cerebral palsy (HCC)   Mild intellectual disability   GERD (gastroesophageal reflux disease)   COPD (chronic obstructive pulmonary disease) (HCC)   Schizoaffective disorder, bipolar type (HCC)   Suicidal ideation  Patient is seen, chart is reviewed.  2 conference calls placed with group homes for coordination of ongoing care (for approximately 35 to 40 minutes). Total Time spent with patient: 75 minutes   HPI:   On admission 08/08/18:  [name] is a 53 year old Caucasian female with a history of schizoaffective disorder, intellectual disability and borderline personality disorder.  She was seen in emergency department due to suicidal ideations, but once evaluated, she denied having them, and therefore she was discharged. Later on the same day, she came back expressing suicidal thoughts again, and racing thoughts.    Past Psychiatric History: Schizoaffective disorder, mild intellectual disability, multiple suicide attempts.  On evaluation, patient slept majority of morning.  She is aroused and cooperative for her conference calls, and then is awake and alert.  She has been compliant with medication and has had no behavioral issues for many days.  She has not been requiring as needed medication.  Following conference calls with group homes, she reports she felt like they went well and she is hopeful that she will be accepted by 1 of them.  She is hoping to hear soon.  She denies further tenderness of right deltoid following Invega Sustenna injection. She reports that her SI, HI, AVH are at her baseline. Reports she is eating and sleeping well.   Historically, she is chronically suicidal  at times especially upon arriving to the ED or when she does not want to be at her current living situation, group homes or her family  Frequently comes to the ED when she does not get what she wants.   Caveat:  Cognitive impairment with low level of frustration tolerance.   Awaiting for group home placement. Care coordinator has gotten approval for higher level of care.  Patient has 2 facilities that will be scheduling interviews with her for placement.  Past Medical History:  Past Medical History:  Diagnosis Date  . Anxiety   . Asthma   . Borderline personality disorder (HCC)   . Cerebral palsy (HCC)   . COPD (chronic obstructive pulmonary disease) (HCC)   . Depression   . GERD (gastroesophageal reflux disease)   . Mild cognitive impairment   . Schizoaffective disorder (HCC)   . Wound abscess     Past Surgical History:  Procedure Laterality Date  . CHOLECYSTECTOMY    . INCISION AND DRAINAGE ABSCESS Right 01/02/2015   Procedure: INCISION AND DRAINAGE ABSCESS;  Surgeon: Tiney Rougealph Ely III, MD;  Location: [facility] ORS;  Service: General;  Laterality: Right;  . TONSILLECTOMY     Family History:  Family History  Problem Relation Age of Onset  . Heart attack Father   . Diabetes Mother    Family Psychiatric  History: None known  Social History:  Social History   Substance and Sexual Activity  Alcohol Use No     Social History   Substance and Sexual Activity  Drug Use No    Social History   Socioeconomic History  . Marital status: Single  Spouse name: Not on file  . Number of children: Not on file  . Years of education: Not on file  . Highest education level: Not on file  Occupational History  . Occupation: Disabled  Social Needs  . Financial resource strain: Not on file  . Food insecurity:    Worry: Not on file    Inability: Not on file  . Transportation needs:    Medical: Not on file    Non-medical: Not on file  Tobacco Use  . Smoking status: Former Games developer  .  Smokeless tobacco: Never Used  Substance and Sexual Activity  . Alcohol use: No  . Drug use: No  . Sexual activity: Not Currently  Lifestyle  . Physical activity:    Days per week: Not on file    Minutes per session: Not on file  . Stress: Not on file  Relationships  . Social connections:    Talks on phone: Not on file    Gets together: Not on file    Attends religious service: Not on file    Active member of club or organization: Not on file    Attends meetings of clubs or organizations: Not on file    Relationship status: Not on file  Other Topics Concern  . Not on file  Social History Narrative   Pt lives with mother, nephew, sister in law   Additional Social History:       Patient is essentially homeless at this time, as she previously was residing in a group home which had provided her 30-day notice. Her 30-day notice is completed.   Care coordinator has gotten approval for higher level of care.  Patient has 2 facilities that will be scheduling interviews with her for placement.   Sleep: Good  Appetite:  Good  Current Medications: Current Facility-Administered Medications  Medication Dose Route Frequency Provider Last Rate Last Dose  . acetaminophen (TYLENOL) tablet 650 mg  650 mg Oral Once Jeanmarie Plant, MD      . acetaminophen (TYLENOL) tablet 650 mg  650 mg Oral Q6H PRN Dionne Bucy, MD   650 mg at 08/26/18 0335  . acetaminophen (TYLENOL) tablet 650 mg  650 mg Oral Once Sharman Cheek, MD   Stopped at 08/23/18 1928  . benztropine (COGENTIN) tablet 2 mg  2 mg Oral BID Reggie Pile, MD   2 mg at 08/25/18 2113  . clonazePAM (KLONOPIN) tablet 1 mg  1 mg Oral BID Reggie Pile, MD   1 mg at 08/25/18 2113  . diphenhydrAMINE (BENADRYL) capsule 50 mg  50 mg Oral Q8H PRN Mariel Craft, MD   50 mg at 08/23/18 2123   Or  . diphenhydrAMINE (BENADRYL) injection 50 mg  50 mg Intramuscular Q8H PRN Mariel Craft, MD   50 mg at 08/24/18 2357  . famotidine  (PEPCID) tablet 20 mg  20 mg Oral BID Reggie Pile, MD   20 mg at 08/25/18 2113  . fenofibrate tablet 160 mg  160 mg Oral Daily Reggie Pile, MD   160 mg at 08/25/18 1001  . fluvoxaMINE (LUVOX) tablet 150 mg  150 mg Oral QHS Mariel Craft, MD   150 mg at 08/25/18 2112  . levothyroxine (SYNTHROID) tablet 50 mcg  50 mcg Oral Q0600 Reggie Pile, MD   50 mcg at 08/26/18 0610  . LORazepam (ATIVAN) tablet 1 mg  1 mg Oral BID PRN Mariel Craft, MD       Or  . LORazepam (  ATIVAN) injection 2 mg  2 mg Intramuscular BID PRN Mariel Craft, MD      . nitrofurantoin (MACRODANTIN) capsule 100 mg  100 mg Oral BID Reggie Pile, MD   100 mg at 08/25/18 2113  . nystatin (MYCOSTATIN/NYSTOP) topical powder   Topical BID Emily Filbert, MD      . Melene Muller ON 08/28/2018] paliperidone (INVEGA SUSTENNA) injection 156 mg  156 mg Intramuscular Q28 days Mariel Craft, MD      . pantoprazole (PROTONIX) EC tablet 40 mg  40 mg Oral Daily Reggie Pile, MD   40 mg at 08/25/18 1001  . phenazopyridine (PYRIDIUM) tablet 100 mg  100 mg Oral TID WC Mariel Craft, MD   100 mg at 08/25/18 1724  . risperiDONE (RISPERDAL) tablet 1 mg  1 mg Oral BID WC Mariel Craft, MD   1 mg at 08/25/18 1706  . risperiDONE (RISPERDAL) tablet 2 mg  2 mg Oral QHS Mariel Craft, MD   2 mg at 08/25/18 2112  . sucralfate (CARAFATE) tablet 1 g  1 g Oral TID WC & HS Reggie Pile, MD   1 g at 08/25/18 2113   Current Outpatient Medications  Medication Sig Dispense Refill  . ARIPiprazole (ABILIFY) 20 MG tablet Take 1 tablet (20 mg total) by mouth daily. 30 tablet 0  . benztropine (COGENTIN) 2 MG tablet Take 1 tablet (2 mg total) by mouth 2 (two) times daily. 60 tablet 0  . clonazePAM (KLONOPIN) 1 MG tablet Take 1 tablet (1 mg total) by mouth 2 (two) times daily. 30 tablet 0  . divalproex (DEPAKOTE ER) 500 MG 24 hr tablet Take 2 tablets (1,000 mg total) by mouth at bedtime. 60 tablet 0  . famotidine (PEPCID) 20 MG tablet Take 1 tablet (20  mg total) by mouth 2 (two) times daily. 60 tablet 0  . fenofibrate 160 MG tablet Take 1 tablet (160 mg total) by mouth daily. 30 tablet 1  . fluvoxaMINE (LUVOX) 100 MG tablet Take 1 tablet (100 mg total) by mouth at bedtime. 30 tablet 0  . levothyroxine (SYNTHROID) 50 MCG tablet Take 1 tablet (50 mcg total) by mouth daily before breakfast. 30 tablet 1  . nitrofurantoin (MACRODANTIN) 100 MG capsule Take 1 capsule (100 mg total) by mouth 2 (two) times daily. 60 capsule 0  . OLANZapine (ZYPREXA) 5 MG tablet Take 1 tablet (5 mg total) by mouth every 8 (eight) hours as needed (agitation). 30 tablet 0  . pantoprazole (PROTONIX) 40 MG tablet Take 1 tablet (40 mg total) by mouth daily. 30 tablet 1  . sucralfate (CARAFATE) 1 g tablet Take 1 tablet (1 g total) by mouth 4 (four) times daily -  with meals and at bedtime. 120 tablet 1    Lab Results:  No results found for this or any previous visit (from the past 48 hour(s)).  Blood Alcohol level:  Lab Results  Component Value Date   ETH <10 08/09/2018   ETH <10 08/01/2018    Metabolic Disorder Labs: Lab Results  Component Value Date   HGBA1C 4.8 08/11/2016   MPG 91 08/11/2016   Lab Results  Component Value Date   PROLACTIN 111.7 (H) 08/11/2016   PROLACTIN 66.8 (H) 06/17/2015   Lab Results  Component Value Date   CHOL 178 08/11/2016   TRIG 402 (H) 08/11/2016   HDL 13 (L) 08/11/2016   CHOLHDL 13.7 08/11/2016   VLDL UNABLE TO CALCULATE IF TRIGLYCERIDE OVER 400 mg/dL 70/14/1030  LDLCALC UNABLE TO CALCULATE IF TRIGLYCERIDE OVER 400 mg/dL 16/01/9603   LDLCALC 82 06/17/2015    Musculoskeletal: Strength & Muscle Tone: within normal limits Gait & Station: normal Patient leans: N/A  Psychiatric Specialty Exam: Physical Exam  Nursing note and vitals reviewed. Constitutional: She appears well-developed and well-nourished. No distress.  HENT:  Head: Normocephalic and atraumatic.  Eyes: EOM are normal.  Neck: Normal range of motion.   Cardiovascular: Normal rate and regular rhythm.  Respiratory: Effort normal. No respiratory distress.  Musculoskeletal: Normal range of motion.  Neurological: She is alert.  Psychiatric: Her behavior is normal. Her speech is clear and coherent with normal rate, tone, and volume. Cognition and memory are fair. She exhibits a euthymic mood. She expresses vague suicidal ideation, and homicidal ideation. She expresses no suicidal or homicidal plans or intent   Review of Systems  Constitutional: Negative Respiratory: Negative.   Cardiovascular: Negative.   Gastrointestinal: Negative.   Genitourinary: Positive for frequency and urgency.  Patient reports dysuria.      Incontinence  Musculoskeletal: Negative Neurological: Negative.   Psychiatric/Behavioral: Vague suicidal ideation, auditory and visual hallucinations. No HI. The patient is nervous.. The patient does not have insomnia.     Blood pressure (!) 131/92, pulse (!) 104, temperature 97.9 F (36.6 C), temperature source Oral, resp. rate 20, last menstrual period 05/28/2018, SpO2 97 %.There is no height or weight on file to calculate BMI.  General Appearance: Casual  Eye Contact:  Good  Speech:  Clear and Coherent  Volume:  Normal  Mood:  Anxious and Euthymic  Affect:  Appropriate  Thought Process:  Coherent  Orientation:  Full (Time, Place, and Person)  Thought Content:  Logical   Suicidal Thoughts:  No  Homicidal Thoughts:  Denies  Memory:  Fair  Judgement:  Fair  Insight:  Shallow  Psychomotor Activity:  Normal  Concentration:  Concentration: Fair  Recall:  Fair  Fund of Knowledge:  Fair  Language:  Good  Akathisia:  No  Handed:  Right  AIMS (if indicated):     Assets:  Financial Resources/Insurance  ADL's:  Impaired  Cognition:  Impaired,  Mild  Sleep:   Adequate    Treatment Plan Summary: Plan Of Care/Follow-up recommendations:  Daily contact with patient to assess and evaluate symptoms and progress in treatment  and Medication management Adjustment disorder with mixed disturbance of emotions and conduct; Schizoaffective disorder, depressive disorder: Continue Risperdal 1 mg daily with morning and evening meals and 2 mg.  These can be changed to as needed doses after Invega reaches steady state via long-acting injectable formulation. Hinda Glatter Sustenna 234 mg intramuscularly 08/24/2018  To be followed by 156 mg on Aug 28, 2018 then dose to be determined by outpatient psychiatrist and provided to patient every 28 days. -Continue Luvox150 mg at bedtime for ruminations, anxiety and depressive disorder  Agitation: Change to as needed Risperdal 1 mg every 8 hours as needed for agitation, in order to maintain single agent. -Continue Benadryl 50 mg every 8 hours PRN agitation  Anxiety: -Continue Klonopin 1 mg twice a day breakfast and dinner for anxiety Ativan 1 mg twice daily as needed for anxiety or sleep prior to patient's interview for group home placement.  Asthma: -Continue albuterol 2 puffs every hours  EPS prevention: -Continue Cogentin 2 mg twice daily for EPS prevention, and to maximize anticholinergic effect for assistance in decreasing urinary incontinence.  UTI prevention: -Continue Macrobid 100 mg daily -Reviewed UA from 08/23/2018, nonspecific for UTI.  -Pyridium  100 mg 3 times daily with meals for complaints of dysuria.  Hypothyroid: -Continue Synthroid 50 mcg daily for hypothyroidism  GERD: -Continue Protonix 40 mg daily -Continue Pepcid 20 mg BID -Continue Carafate 1 gram with meals and at bedtime  Elevated triglycerides -Continue Fenofibrate 160 mg daily  Yeast infection prevention: Nystatin powder BID  We will encourage patient to makeurological/gynecology appointment after discharge.  Disposition: Continue working with her ACT team/Social work for placement in another group home setting, as patient is unlikely to be successful living independently. Patient is  currently staying in emergency department to receive assistance from social work on housing.  Patient is made aware that she will need to have insured medication compliance before discharge.  Care coordinator has gotten approval for higher level of care.  Patient has 2 facilities that will be scheduling interviews with her for placement.  Patient is scheduled for a virtual meeting with a potential group home placement [facility]).  A virtual Zoom meeting has been scheduled for Monday, Aug 26, 2018 at 1:00pm.   Meeting ID: [id number] Password: [email or web address]    Mariel Craft, MD 08/26/2018, 9:47 AM

## 2018-08-26 NOTE — ED Notes (Signed)
BEHAVIORAL HEALTH ROUNDING Patient sleeping: No. Patient alert and oriented: yes Behavior appropriate: Yes.  ; If no, describe:  Nutrition and fluids offered: yes Toileting and hygiene offered: Yes  Sitter present: q15 minute observations and security  monitoring Law enforcement present: Yes  ODS  

## 2018-08-26 NOTE — ED Notes (Addendum)

## 2018-08-26 NOTE — ED Notes (Signed)
Received in report that she has urinated on her self multiple times since 0530 today - she has now ambulated to the BR  - I went to the door and she agreed to take a shower  I assisted her with bathing and shampooing her hair   Linens were clean on her bed - room mopped  Pillowcase changed

## 2018-08-26 NOTE — ED Notes (Signed)
Patient in shower at this time with nurse Amy T. helping  patient take shower.

## 2018-08-27 DIAGNOSIS — G809 Cerebral palsy, unspecified: Secondary | ICD-10-CM | POA: Diagnosis not present

## 2018-08-27 DIAGNOSIS — F70 Mild intellectual disabilities: Secondary | ICD-10-CM | POA: Diagnosis not present

## 2018-08-27 DIAGNOSIS — R4189 Other symptoms and signs involving cognitive functions and awareness: Secondary | ICD-10-CM | POA: Diagnosis not present

## 2018-08-27 DIAGNOSIS — F4325 Adjustment disorder with mixed disturbance of emotions and conduct: Secondary | ICD-10-CM | POA: Diagnosis not present

## 2018-08-27 DIAGNOSIS — F250 Schizoaffective disorder, bipolar type: Secondary | ICD-10-CM | POA: Diagnosis not present

## 2018-08-27 NOTE — ED Notes (Signed)
Hourly rounding reveals patient in hall bed. No complaints, stable, in no acute distress. Q15 minute rounds and monitoring via Rover and Officer to continue.  

## 2018-08-27 NOTE — ED Notes (Signed)
VOL  PENDING  PLACEMENT 

## 2018-08-27 NOTE — ED Notes (Signed)
BEHAVIORAL HEALTH ROUNDING Patient sleeping: Yes.   Patient alert and oriented: eyes closed  Appears to be asleep Behavior appropriate: Yes.  ; If no, describe:  Nutrition and fluids offered: Yes  Toileting and hygiene offered: sleeping Sitter present: q 15 minute observations and security monitoring Law enforcement present: yes  ODS 

## 2018-08-27 NOTE — Progress Notes (Signed)
[facility] MD Progress Note  08/27/2018 9:40 AM Rynlee Cosman  MRN:  [id number] Subjective:  "I am happy" Principal Problem: Adjustment disorder with mixed disturbance of emotions and conduct Diagnosis: Principal Problem:   Adjustment disorder with mixed disturbance of emotions and conduct Active Problems:   Cerebral palsy (HCC)   Mild intellectual disability   GERD (gastroesophageal reflux disease)   COPD (chronic obstructive pulmonary disease) (HCC)   Schizoaffective disorder, bipolar type (HCC)   Suicidal ideation  Patient is seen, chart is reviewed.   Total Time spent with patient: 35 minutes   HPI:   On admission 08/08/18:  Sherhonda is a 53 year old Caucasian female with a history of schizoaffective disorder, intellectual disability and borderline personality disorder.  She was seen in emergency department due to suicidal ideations, but once evaluated, she denied having them, and therefore she was discharged. Later on the same day, she came back expressing suicidal thoughts again, and racing thoughts.    Past Psychiatric History: Schizoaffective disorder, mild intellectual disability, multiple suicide attempts.  On evaluation, patient has been awake and alert.  She is calm and cooperative and participating in her ADLs without complaint.  Patient is happy to learn that she has been accepted by the group home that she was preferring, and is hopeful for discharge soon.  She asks repeatedly throughout shift when she will be able to go.  Patient describes that Pyridium has been helpful to alleviate her symptoms of dysuria.  She states that she still continues to have strong smelling urine.  She is reassured that she does not have a UTI currently and is on a daily prophylactic medicine to prevent UTI.  She has been compliant with medication and has had no behavioral issues for many days.  She has not been requiring as needed medication. She reports that her SI, HI, AVH are at her baseline. Reports  she is eating and sleeping well.   Historically, she is chronically suicidal at times especially upon arriving to the ED or when she does not want to be at her current living situation, group homes or her family  Frequently comes to the ED when she does not get what she wants.   Caveat:  Cognitive impairment with low level of frustration tolerance.   Awaiting for group home placement. Care coordinator has gotten approval for higher level of care.  She has had interviews and has been accepted at one facility, waiting to hear from the second.  Paperwork being completed.  Please see social work note.   Past Medical History:  Past Medical History:  Diagnosis Date  . Anxiety   . Asthma   . Borderline personality disorder (HCC)   . Cerebral palsy (HCC)   . COPD (chronic obstructive pulmonary disease) (HCC)   . Depression   . GERD (gastroesophageal reflux disease)   . Mild cognitive impairment   . Schizoaffective disorder (HCC)   . Wound abscess     Past Surgical History:  Procedure Laterality Date  . CHOLECYSTECTOMY    . INCISION AND DRAINAGE ABSCESS Right 01/02/2015   Procedure: INCISION AND DRAINAGE ABSCESS;  Surgeon: Tiney Rouge III, MD;  Location: [facility] ORS;  Service: General;  Laterality: Right;  . TONSILLECTOMY     Family History:  Family History  Problem Relation Age of Onset  . Heart attack Father   . Diabetes Mother    Family Psychiatric  History: None known  Social History:  Social History   Substance and Sexual Activity  Alcohol Use  No     Social History   Substance and Sexual Activity  Drug Use No    Social History   Socioeconomic History  . Marital status: Single    Spouse name: Not on file  . Number of children: Not on file  . Years of education: Not on file  . Highest education level: Not on file  Occupational History  . Occupation: Disabled  Social Needs  . Financial resource strain: Not on file  . Food insecurity:    Worry: Not on file     Inability: Not on file  . Transportation needs:    Medical: Not on file    Non-medical: Not on file  Tobacco Use  . Smoking status: Former Games developer  . Smokeless tobacco: Never Used  Substance and Sexual Activity  . Alcohol use: No  . Drug use: No  . Sexual activity: Not Currently  Lifestyle  . Physical activity:    Days per week: Not on file    Minutes per session: Not on file  . Stress: Not on file  Relationships  . Social connections:    Talks on phone: Not on file    Gets together: Not on file    Attends religious service: Not on file    Active member of club or organization: Not on file    Attends meetings of clubs or organizations: Not on file    Relationship status: Not on file  Other Topics Concern  . Not on file  Social History Narrative   Pt lives with mother, nephew, sister in law   Additional Social History:       Patient is essentially homeless at this time, as she previously was residing in a group home which had provided her 30-day notice. Her 30-day notice is completed.   Care coordinator has gotten approval for higher level of care.  Paperwork being completed for placement.   Sleep: Good  Appetite:  Good  Current Medications: Current Facility-Administered Medications  Medication Dose Route Frequency Provider Last Rate Last Dose  . acetaminophen (TYLENOL) tablet 650 mg  650 mg Oral Once Jeanmarie Plant, MD      . acetaminophen (TYLENOL) tablet 650 mg  650 mg Oral Q6H PRN Dionne Bucy, MD   650 mg at 08/26/18 0335  . acetaminophen (TYLENOL) tablet 650 mg  650 mg Oral Once Sharman Cheek, MD   Stopped at 08/23/18 1928  . benztropine (COGENTIN) tablet 2 mg  2 mg Oral BID Reggie Pile, MD   2 mg at 08/26/18 2149  . clonazePAM (KLONOPIN) tablet 1 mg  1 mg Oral BID Reggie Pile, MD   1 mg at 08/26/18 2149  . diphenhydrAMINE (BENADRYL) capsule 50 mg  50 mg Oral Q8H PRN Mariel Craft, MD   50 mg at 08/23/18 2123   Or  . diphenhydrAMINE (BENADRYL)  injection 50 mg  50 mg Intramuscular Q8H PRN Mariel Craft, MD   50 mg at 08/24/18 2357  . famotidine (PEPCID) tablet 20 mg  20 mg Oral BID Reggie Pile, MD   20 mg at 08/26/18 2150  . fenofibrate tablet 160 mg  160 mg Oral Daily Reggie Pile, MD   160 mg at 08/26/18 1722  . fluvoxaMINE (LUVOX) tablet 150 mg  150 mg Oral QHS Mariel Craft, MD   150 mg at 08/26/18 2150  . levothyroxine (SYNTHROID) tablet 50 mcg  50 mcg Oral Q0600 Reggie Pile, MD   50 mcg at 08/27/18 [id number]  .  LORazepam (ATIVAN) tablet 1 mg  1 mg Oral BID PRN Mariel Craft, MD       Or  . LORazepam (ATIVAN) injection 2 mg  2 mg Intramuscular BID PRN Mariel Craft, MD      . nitrofurantoin (MACRODANTIN) capsule 100 mg  100 mg Oral Q2000 Mariel Craft, MD   100 mg at 08/26/18 2151  . nystatin (MYCOSTATIN/NYSTOP) topical powder   Topical BID Emily Filbert, MD      . Melene Muller ON 08/28/2018] paliperidone (INVEGA SUSTENNA) injection 156 mg  156 mg Intramuscular Q28 days Mariel Craft, MD      . pantoprazole (PROTONIX) EC tablet 40 mg  40 mg Oral Daily Reggie Pile, MD   40 mg at 08/26/18 1720  . phenazopyridine (PYRIDIUM) tablet 100 mg  100 mg Oral TID WC Mariel Craft, MD   100 mg at 08/26/18 1720  . risperiDONE (RISPERDAL) tablet 1 mg  1 mg Oral BID WC Mariel Craft, MD   1 mg at 08/26/18 1719  . risperiDONE (RISPERDAL) tablet 2 mg  2 mg Oral QHS Mariel Craft, MD   2 mg at 08/26/18 2149  . sucralfate (CARAFATE) tablet 1 g  1 g Oral TID WC & HS Reggie Pile, MD   1 g at 08/26/18 2150   Current Outpatient Medications  Medication Sig Dispense Refill  . ARIPiprazole (ABILIFY) 20 MG tablet Take 1 tablet (20 mg total) by mouth daily. 30 tablet 0  . benztropine (COGENTIN) 2 MG tablet Take 1 tablet (2 mg total) by mouth 2 (two) times daily. 60 tablet 0  . clonazePAM (KLONOPIN) 1 MG tablet Take 1 tablet (1 mg total) by mouth 2 (two) times daily. 30 tablet 0  . divalproex (DEPAKOTE ER) 500 MG 24 hr tablet Take 2  tablets (1,000 mg total) by mouth at bedtime. 60 tablet 0  . famotidine (PEPCID) 20 MG tablet Take 1 tablet (20 mg total) by mouth 2 (two) times daily. 60 tablet 0  . fenofibrate 160 MG tablet Take 1 tablet (160 mg total) by mouth daily. 30 tablet 1  . fluvoxaMINE (LUVOX) 100 MG tablet Take 1 tablet (100 mg total) by mouth at bedtime. 30 tablet 0  . levothyroxine (SYNTHROID) 50 MCG tablet Take 1 tablet (50 mcg total) by mouth daily before breakfast. 30 tablet 1  . nitrofurantoin (MACRODANTIN) 100 MG capsule Take 1 capsule (100 mg total) by mouth 2 (two) times daily. 60 capsule 0  . OLANZapine (ZYPREXA) 5 MG tablet Take 1 tablet (5 mg total) by mouth every 8 (eight) hours as needed (agitation). 30 tablet 0  . pantoprazole (PROTONIX) 40 MG tablet Take 1 tablet (40 mg total) by mouth daily. 30 tablet 1  . sucralfate (CARAFATE) 1 g tablet Take 1 tablet (1 g total) by mouth 4 (four) times daily -  with meals and at bedtime. 120 tablet 1    Lab Results:  No results found for this or any previous visit (from the past 48 hour(s)).  Blood Alcohol level:  Lab Results  Component Value Date   [facility] <10 08/09/2018   ETH <10 08/01/2018    Metabolic Disorder Labs: Lab Results  Component Value Date   HGBA1C 4.8 08/11/2016   MPG 91 08/11/2016   Lab Results  Component Value Date   PROLACTIN 111.7 (H) 08/11/2016   PROLACTIN 66.8 (H) 06/17/2015   Lab Results  Component Value Date   CHOL 178 08/11/2016   TRIG  402 (H) 08/11/2016   HDL 13 (L) 08/11/2016   CHOLHDL 13.7 08/11/2016   VLDL UNABLE TO CALCULATE IF TRIGLYCERIDE OVER 400 mg/dL 74/25/9563   LDLCALC UNABLE TO CALCULATE IF TRIGLYCERIDE OVER 400 mg/dL 87/56/4332   LDLCALC 82 06/17/2015    Musculoskeletal: Strength & Muscle Tone: within normal limits Gait & Station: normal Patient leans: N/A  Psychiatric Specialty Exam: Physical Exam  Nursing note and vitals reviewed. Constitutional: She appears well-developed and well-nourished. No  distress.  HENT:  Head: Normocephalic and atraumatic.  Eyes: EOM are normal.  Neck: Normal range of motion.  Cardiovascular: Normal rate and regular rhythm.  Respiratory: Effort normal. No respiratory distress.  Musculoskeletal: Normal range of motion.  Neurological: She is alert.  Psychiatric: Her behavior is normal. Her speech is clear and coherent with normal rate, tone, and volume. Cognition and memory are fair. She exhibits a euthymic mood. She expresses vague suicidal ideation, and homicidal ideation. She expresses no suicidal or homicidal plans or intent   Review of Systems  Constitutional: Negative Respiratory: Negative.   Cardiovascular: Negative.   Gastrointestinal: Negative.   Genitourinary: Positive for frequency and urgency.  Patient reports dysuria has resolved.      Incontinence  Musculoskeletal: Negative Neurological: Negative.   Psychiatric/Behavioral: Vague suicidal ideation, auditory and visual hallucinations. No HI. The patient is nervous at baseline. The patient does not have insomnia.     Blood pressure 135/79, pulse 86, temperature 98 F (36.7 C), temperature source Oral, resp. rate 16, last menstrual period 05/28/2018, SpO2 100 %.There is no height or weight on file to calculate BMI.  General Appearance: Casual  Eye Contact:  Good  Speech:  Clear and Coherent  Volume:  Normal  Mood:  Anxious and Euthymic  Affect:  Appropriate  Thought Process:  Coherent  Orientation:  Full (Time, Place, and Person)  Thought Content:  Logical   Suicidal Thoughts:  No  Homicidal Thoughts:  Denies  Memory:  Fair  Judgement:  Fair  Insight:  Shallow  Psychomotor Activity:  Normal  Concentration:  Concentration: Fair  Recall:  Fair  Fund of Knowledge:  Fair  Language:  Good  Akathisia:  No  Handed:  Right  AIMS (if indicated):     Assets:  Financial Resources/Insurance  ADL's:  Impaired  Cognition:  Impaired,  Mild  Sleep:   Adequate    Treatment Plan  Summary: Plan Of Care/Follow-up recommendations:  Daily contact with patient to assess and evaluate symptoms and progress in treatment and Medication management Adjustment disorder with mixed disturbance of emotions and conduct; Schizoaffective disorder, depressive disorder: Continue Risperdal 1 mg daily with morning and evening meals and 2 mg.  These can be changed to as needed doses after Invega reaches steady state via long-acting injectable formulation. Hinda Glatter Sustenna 234 mg intramuscularly 08/24/2018  To be followed by 156 mg on Aug 28, 2018 then dose to be determined by outpatient psychiatrist and provided to patient every 28 days. -Continue Luvox150 mg at bedtime for ruminations, anxiety and depressive disorder  Agitation: Change to as needed Risperdal 1 mg every 8 hours as needed for agitation, in order to maintain single agent. -Continue Benadryl 50 mg every 8 hours PRN agitation  Anxiety: -Continue Klonopin 1 mg twice a day breakfast and dinner for anxiety Ativan 1 mg twice daily as needed for anxiety or sleep prior to patient's interview for group home placement.  Asthma: -Continue albuterol 2 puffs every hours  EPS prevention: -Continue Cogentin 2 mg twice daily for  EPS prevention, and to maximize anticholinergic effect for assistance in decreasing urinary incontinence.  UTI prevention: -Continue Macrobid 100 mg daily -Reviewed UA from 08/23/2018, nonspecific for UTI.  -Pyridium 100 mg 3 times daily with meals for complaints of dysuria.  Hypothyroid: -Continue Synthroid 50 mcg daily for hypothyroidism  GERD: -Continue Protonix 40 mg daily -Continue Pepcid 20 mg BID -Continue Carafate 1 gram with meals and at bedtime  Elevated triglycerides -Continue Fenofibrate 160 mg daily  Yeast infection prevention: Nystatin powder BID  We will encourage patient to makeurological/gynecology appointment after discharge.  Disposition: Continue working with her ACT  team/Social work for placement in another group home setting, as patient is unlikely to be successful living independently. Patient is currently staying in emergency department to receive assistance from social work on housing.    New placement pending completion of paperwork.  Mariel CraftSHEILA M Zeke Aker, MD 08/27/2018, 9:40 AM

## 2018-08-27 NOTE — ED Notes (Signed)
Pt had an episode of incontinence. Staff is encouraging patient to go to bathroom to wash up.  Clean scrubs and supplies provided. Pt is stating she cannot walk.

## 2018-08-27 NOTE — ED Notes (Signed)
Hourly rounding reveals patient in room. No complaints, stable, in no acute distress. Q15 minute rounds and monitoring via Rover and Officer to continue.   

## 2018-08-27 NOTE — ED Notes (Signed)
Patient went to the bathroom in bed. Had her walk to the restroom and clean herself up, change clothes, we clean bed up, and moved her to a room 20.

## 2018-08-27 NOTE — ED Provider Notes (Signed)
-----------------------------------------   7:00 AM on 08/27/2018 -----------------------------------------   Blood pressure 135/79, pulse 86, temperature 98 F (36.7 C), temperature source Oral, resp. rate 16, last menstrual period 05/28/2018, SpO2 100 %.  The patient is calm and cooperative at this time.  There have been no acute events since the last update.  Awaiting disposition plan from Behavioral Medicine team and social work team.   Loleta Rose, MD 08/27/18 0700

## 2018-08-27 NOTE — ED Notes (Signed)
Pt asleep, meal tray at side.

## 2018-08-27 NOTE — TOC Progression Note (Addendum)
Transition of Care [facility]) - Progression Note    Patient Details  Name: [name] MRN: [id number] Date of Birth: 1965-10-14  [facility] CM/SW Contact  Tania Esraa Seres, LCSW Phone Number: 08/27/2018, 11:47 AM  Clinical Narrative:    11:34am Pt's care coordinator, Larena Sox, contacted CSW and shared that [facility] has accepted the pt. [name] shared that she is still waiting on [facility] to contact her and to present pt with a choice.  [name] shared that she will follow-up with CSW.   3:26pm -CSW attempted to contact pt's care coordinator in order to move forward with group home placement process. Voicemail left.   4:33pm - Pt's care coordinator returned call to CSW. [name] stated that she is fine with moving forward with [facility] since [facility] has yet to contact her. Pt preferred to go to [facility] yesterday (08/26/2018) after interviews.  [name] informed CSW that the group home is working on a "member services agreement" and she is unsure of the time frame of when pt will be able to be admitted into the group home. [name] reported that she will continue to wait for [facility] and follow-up with CSW.        Expected Discharge Plan and Services                                                 Social Determinants of Health (SDOH) Interventions    Readmission Risk Interventions No flowsheet data found.

## 2018-08-27 NOTE — ED Notes (Signed)

## 2018-08-27 NOTE — ED Notes (Signed)
Patient observed lying in a hallway bed with eyes closed  Even, unlabored respirations observed   NAD pt appears to be sleeping  I will continue to monitor along with every 15 minute visual observations and ongoing security monitoring    

## 2018-08-27 NOTE — ED Notes (Signed)
Report to include Situation, Background, Assessment, and Recommendations received from Amy B. RN. Patient alert and oriented, warm and dry, in no acute distress. Patient denies SI, HI, AVH and pain. Patient made aware of Q15 minute rounds and Rover and Officer presence for their safety. Patient instructed to come to me with needs or concerns.  

## 2018-08-28 DIAGNOSIS — F70 Mild intellectual disabilities: Secondary | ICD-10-CM | POA: Diagnosis not present

## 2018-08-28 DIAGNOSIS — F4325 Adjustment disorder with mixed disturbance of emotions and conduct: Secondary | ICD-10-CM | POA: Diagnosis not present

## 2018-08-28 DIAGNOSIS — G809 Cerebral palsy, unspecified: Secondary | ICD-10-CM | POA: Diagnosis not present

## 2018-08-28 DIAGNOSIS — F250 Schizoaffective disorder, bipolar type: Secondary | ICD-10-CM | POA: Diagnosis not present

## 2018-08-28 DIAGNOSIS — R4189 Other symptoms and signs involving cognitive functions and awareness: Secondary | ICD-10-CM | POA: Diagnosis not present

## 2018-08-28 NOTE — ED Notes (Addendum)
Pt given meal tray. Pt encouraged to preform hand hygiene.

## 2018-08-28 NOTE — ED Notes (Signed)
Hourly rounding reveals patient sleeping in room. No complaints, stable, in no acute distress. Q15 minute rounds and monitoring via Security to continue. 

## 2018-08-28 NOTE — ED Notes (Signed)
Pt had incontinent episode in bed.  Bed changed by nurse.  Pt changed scrubs, but refused to take a shower.

## 2018-08-28 NOTE — ED Notes (Signed)
Pt ambulatory to bathroom with no problem.

## 2018-08-28 NOTE — ED Notes (Signed)
Hourly rounding reveals patient sleeping in room. No complaints, stable, in no acute distress. Q15 minute rounds and monitoring via Rover and Officer to continue.  

## 2018-08-28 NOTE — ED Notes (Signed)
Hourly rounding reveals patient in room. No complaints, stable, in no acute distress. Q15 minute rounds and monitoring via Rover and Officer to continue.   

## 2018-08-28 NOTE — ED Provider Notes (Signed)
-----------------------------------------   12:51 PM on 08/28/2018 -----------------------------------------   BP 122/77 (BP Location: Right Arm)   Pulse 90   Temp 97.9 F (36.6 C) (Oral)   Resp 18   LMP 05/28/2018 (LMP Unknown)   SpO2 98%   No acute events overnight. Vitals reviewed. Patient remains medically cleared.  Disposition is pending per Psychiatry/Behavioral Medicine team recommendations.    Jene Every, MD 08/28/18 1251

## 2018-08-28 NOTE — ED Notes (Addendum)
Pt requesting to use the phone to call her CST worker, [name].  Phone given so that patient could call worker.  Pt left voicemail for CST worker.  CST worker returned patient's phone call.  Pt on the phone with CST worker at this time.

## 2018-08-28 NOTE — ED Notes (Signed)
Pt up to bathroom at this time

## 2018-08-28 NOTE — ED Notes (Signed)
Pt up to bathroom, ambulated without issue with steady gait

## 2018-08-28 NOTE — Progress Notes (Signed)
[facility] MD Progress Note  08/28/2018 10:00 AM [name]  MRN:  [id number] Subjective:  "I am happy" Principal Problem: Adjustment disorder with mixed disturbance of emotions and conduct Diagnosis: Principal Problem:   Adjustment disorder with mixed disturbance of emotions and conduct Active Problems:   Cerebral palsy (HCC)   Mild intellectual disability   GERD (gastroesophageal reflux disease)   COPD (chronic obstructive pulmonary disease) (HCC)   Schizoaffective disorder, bipolar type (HCC)   Suicidal ideation  Patient is seen, chart is reviewed.   Total Time spent with patient: 20 minutes   HPI:   On admission 08/08/18:  [name] is a 53 year old Caucasian female with a history of schizoaffective disorder, intellectual disability and borderline personality disorder.  She was seen in emergency department due to suicidal ideations, but once evaluated, she denied having them, and therefore she was discharged. Later on the same day, she came back expressing suicidal thoughts again, and racing thoughts.    Past Psychiatric History: Schizoaffective disorder, mild intellectual disability, multiple suicide attempts.  On evaluation, patient has been awake and alert.  She is calm and cooperative and participating in her ADLs without complaint.  Patient states she is happy, but then is disappointed to hear that there is still paperwork that needs to be processed before she moved to her new group home.  Patient is provided the phone to contact her care coordinator who reassures her that half of the paperwork has already been completed.  Patient is hopeful for discharge soon.  She has been compliant with medication and has had no behavioral issues for many days.  She has not been requiring as needed medication. She reports that her SI, HI, AVH are at her baseline. Reports she is eating and sleeping well.   Historically, she is chronically suicidal at times especially upon arriving to the ED or when  she does not want to be at her current living situation, group homes or her family  Frequently comes to the ED when she does not get what she wants.   Caveat:  Cognitive impairment with low level of frustration tolerance.   Awaiting for group home placement. Care coordinator has gotten approval for higher level of care.  She has had interviews and has been accepted at one facility, waiting to hear from the second.  Paperwork being completed.  Please see social work note.   Past Medical History:  Past Medical History:  Diagnosis Date  . Anxiety   . Asthma   . Borderline personality disorder (HCC)   . Cerebral palsy (HCC)   . COPD (chronic obstructive pulmonary disease) (HCC)   . Depression   . GERD (gastroesophageal reflux disease)   . Mild cognitive impairment   . Schizoaffective disorder (HCC)   . Wound abscess     Past Surgical History:  Procedure Laterality Date  . CHOLECYSTECTOMY    . INCISION AND DRAINAGE ABSCESS Right 01/02/2015   Procedure: INCISION AND DRAINAGE ABSCESS;  Surgeon: Tiney Rouge III, MD;  Location: [facility] ORS;  Service: General;  Laterality: Right;  . TONSILLECTOMY     Family History:  Family History  Problem Relation Age of Onset  . Heart attack Father   . Diabetes Mother    Family Psychiatric  History: None known  Social History:  Social History   Substance and Sexual Activity  Alcohol Use No     Social History   Substance and Sexual Activity  Drug Use No    Social History   Socioeconomic History  .  Marital status: Single    Spouse name: Not on file  . Number of children: Not on file  . Years of education: Not on file  . Highest education level: Not on file  Occupational History  . Occupation: Disabled  Social Needs  . Financial resource strain: Not on file  . Food insecurity:    Worry: Not on file    Inability: Not on file  . Transportation needs:    Medical: Not on file    Non-medical: Not on file  Tobacco Use  . Smoking status:  Former Games developer  . Smokeless tobacco: Never Used  Substance and Sexual Activity  . Alcohol use: No  . Drug use: No  . Sexual activity: Not Currently  Lifestyle  . Physical activity:    Days per week: Not on file    Minutes per session: Not on file  . Stress: Not on file  Relationships  . Social connections:    Talks on phone: Not on file    Gets together: Not on file    Attends religious service: Not on file    Active member of club or organization: Not on file    Attends meetings of clubs or organizations: Not on file    Relationship status: Not on file  Other Topics Concern  . Not on file  Social History Narrative   Pt lives with mother, nephew, sister in law   Additional Social History:       Patient is essentially homeless at this time, as she previously was residing in a group home which had provided her 30-day notice. Her 30-day notice is completed.   Care coordinator has gotten approval for higher level of care.  Paperwork being completed for placement.   Sleep: Good  Appetite:  Good  Current Medications: Current Facility-Administered Medications  Medication Dose Route Frequency Provider Last Rate Last Dose  . acetaminophen (TYLENOL) tablet 650 mg  650 mg Oral Once Jeanmarie Plant, MD      . acetaminophen (TYLENOL) tablet 650 mg  650 mg Oral Q6H PRN Dionne Bucy, MD   650 mg at 08/27/18 1306  . acetaminophen (TYLENOL) tablet 650 mg  650 mg Oral Once Sharman Cheek, MD   Stopped at 08/23/18 1928  . benztropine (COGENTIN) tablet 2 mg  2 mg Oral BID Reggie Pile, MD   2 mg at 08/27/18 2111  . clonazePAM (KLONOPIN) tablet 1 mg  1 mg Oral BID Reggie Pile, MD   1 mg at 08/27/18 2111  . diphenhydrAMINE (BENADRYL) capsule 50 mg  50 mg Oral Q8H PRN Mariel Craft, MD   50 mg at 08/23/18 2123   Or  . diphenhydrAMINE (BENADRYL) injection 50 mg  50 mg Intramuscular Q8H PRN Mariel Craft, MD   50 mg at 08/24/18 2357  . famotidine (PEPCID) tablet 20 mg  20 mg  Oral BID Reggie Pile, MD   20 mg at 08/27/18 2111  . fenofibrate tablet 160 mg  160 mg Oral Daily Reggie Pile, MD   160 mg at 08/27/18 1220  . fluvoxaMINE (LUVOX) tablet 150 mg  150 mg Oral QHS Mariel Craft, MD   150 mg at 08/27/18 2111  . levothyroxine (SYNTHROID) tablet 50 mcg  50 mcg Oral Q0600 Reggie Pile, MD   50 mcg at 08/28/18 1610  . LORazepam (ATIVAN) tablet 1 mg  1 mg Oral BID PRN Mariel Craft, MD       Or  . LORazepam (ATIVAN) injection  2 mg  2 mg Intramuscular BID PRN Mariel Craft, MD      . nitrofurantoin (MACRODANTIN) capsule 100 mg  100 mg Oral Q2000 Mariel Craft, MD   100 mg at 08/27/18 2111  . nystatin (MYCOSTATIN/NYSTOP) topical powder   Topical BID Emily Filbert, MD      . paliperidone [facility] SUSTENNA) injection 156 mg  156 mg Intramuscular Q28 days Mariel Craft, MD   156 mg at 08/28/18 0509  . pantoprazole (PROTONIX) EC tablet 40 mg  40 mg Oral Daily Reggie Pile, MD   40 mg at 08/27/18 1220  . phenazopyridine (PYRIDIUM) tablet 100 mg  100 mg Oral TID WC Mariel Craft, MD   100 mg at 08/27/18 1653  . risperiDONE (RISPERDAL) tablet 1 mg  1 mg Oral BID WC Mariel Craft, MD   1 mg at 08/27/18 1653  . risperiDONE (RISPERDAL) tablet 2 mg  2 mg Oral QHS Mariel Craft, MD   2 mg at 08/27/18 2112  . sucralfate (CARAFATE) tablet 1 g  1 g Oral TID WC & HS Reggie Pile, MD   1 g at 08/27/18 2112   Current Outpatient Medications  Medication Sig Dispense Refill  . ARIPiprazole (ABILIFY) 20 MG tablet Take 1 tablet (20 mg total) by mouth daily. 30 tablet 0  . benztropine (COGENTIN) 2 MG tablet Take 1 tablet (2 mg total) by mouth 2 (two) times daily. 60 tablet 0  . clonazePAM (KLONOPIN) 1 MG tablet Take 1 tablet (1 mg total) by mouth 2 (two) times daily. 30 tablet 0  . divalproex (DEPAKOTE ER) 500 MG 24 hr tablet Take 2 tablets (1,000 mg total) by mouth at bedtime. 60 tablet 0  . famotidine (PEPCID) 20 MG tablet Take 1 tablet (20 mg total) by mouth 2  (two) times daily. 60 tablet 0  . fenofibrate 160 MG tablet Take 1 tablet (160 mg total) by mouth daily. 30 tablet 1  . fluvoxaMINE (LUVOX) 100 MG tablet Take 1 tablet (100 mg total) by mouth at bedtime. 30 tablet 0  . levothyroxine (SYNTHROID) 50 MCG tablet Take 1 tablet (50 mcg total) by mouth daily before breakfast. 30 tablet 1  . nitrofurantoin (MACRODANTIN) 100 MG capsule Take 1 capsule (100 mg total) by mouth 2 (two) times daily. 60 capsule 0  . OLANZapine (ZYPREXA) 5 MG tablet Take 1 tablet (5 mg total) by mouth every 8 (eight) hours as needed (agitation). 30 tablet 0  . pantoprazole (PROTONIX) 40 MG tablet Take 1 tablet (40 mg total) by mouth daily. 30 tablet 1  . sucralfate (CARAFATE) 1 g tablet Take 1 tablet (1 g total) by mouth 4 (four) times daily -  with meals and at bedtime. 120 tablet 1    Lab Results:  No results found for this or any previous visit (from the past 48 hour(s)).  Blood Alcohol level:  Lab Results  Component Value Date   ETH <10 08/09/2018   ETH <10 08/01/2018    Metabolic Disorder Labs: Lab Results  Component Value Date   HGBA1C 4.8 08/11/2016   MPG 91 08/11/2016   Lab Results  Component Value Date   PROLACTIN 111.7 (H) 08/11/2016   PROLACTIN 66.8 (H) 06/17/2015   Lab Results  Component Value Date   CHOL 178 08/11/2016   TRIG 402 (H) 08/11/2016   HDL 13 (L) 08/11/2016   CHOLHDL 13.7 08/11/2016   VLDL UNABLE TO CALCULATE IF TRIGLYCERIDE OVER 400 mg/dL 62/70/3500  LDLCALC UNABLE TO CALCULATE IF TRIGLYCERIDE OVER 400 mg/dL 54/12/8117   LDLCALC 82 06/17/2015    Musculoskeletal: Strength & Muscle Tone: within normal limits Gait & Station: normal Patient leans: N/A  Psychiatric Specialty Exam: Physical Exam  Nursing note and vitals reviewed. Constitutional: She appears well-developed and well-nourished. No distress.  HENT:  Head: Normocephalic and atraumatic.  Eyes: EOM are normal.  Neck: Normal range of motion.  Cardiovascular: Normal  rate and regular rhythm.  Respiratory: Effort normal. No respiratory distress.  Musculoskeletal: Normal range of motion.  Neurological: She is alert.  Psychiatric: Her behavior is normal. Her speech is clear and coherent with normal rate, tone, and volume. Cognition and memory are fair. She exhibits a euthymic mood. She expresses vague suicidal ideation, and homicidal ideation. She expresses no suicidal or homicidal plans or intent   Review of Systems  Constitutional: Negative Respiratory: Negative.   Cardiovascular: Negative.   Gastrointestinal: Negative.   Genitourinary: Positive for frequency and urgency.  Patient reports dysuria has resolved.      Incontinence  Musculoskeletal: Negative Neurological: Negative.   Psychiatric/Behavioral: Vague suicidal ideation, auditory and visual hallucinations. No HI. The patient is nervous at baseline. The patient does not have insomnia.     Blood pressure 122/77, pulse 90, temperature 97.9 F (36.6 C), temperature source Oral, resp. rate 18, last menstrual period 05/28/2018, SpO2 98 %.There is no height or weight on file to calculate BMI.  General Appearance: Casual  Eye Contact:  Good  Speech:  Clear and Coherent  Volume:  Normal  Mood:  Anxious and Euthymic  Affect:  Appropriate  Thought Process:  Coherent  Orientation:  Full (Time, Place, and Person)  Thought Content:  Logical   Suicidal Thoughts:  No  Homicidal Thoughts:  Denies  Memory:  Fair  Judgement:  Fair  Insight:  Shallow  Psychomotor Activity:  Normal  Concentration:  Concentration: Fair  Recall:  Fair  Fund of Knowledge:  Fair  Language:  Good  Akathisia:  No  Handed:  Right  AIMS (if indicated):     Assets:  Financial Resources/Insurance  ADL's:  Impaired  Cognition:  Impaired,  Mild  Sleep:   Adequate    Treatment Plan Summary: Plan Of Care/Follow-up recommendations:  Daily contact with patient to assess and evaluate symptoms and progress in treatment and  Medication management Adjustment disorder with mixed disturbance of emotions and conduct; Schizoaffective disorder, depressive disorder: Continue Risperdal 1 mg daily with morning and evening meals and 2 mg.  These can be changed to as needed doses after Invega reaches steady state via long-acting injectable formulation. Hinda Glatter Sustenna 234 mg intramuscularly 08/24/2018  To be followed by 156 mg on Aug 28, 2018 then dose to be determined by outpatient psychiatrist and provided to patient every 28 days. -Continue Luvox150 mg at bedtime for ruminations, anxiety and depressive disorder  Agitation: Change to as needed Risperdal 1 mg every 8 hours as needed for agitation, in order to maintain single agent. -Continue Benadryl 50 mg every 8 hours PRN agitation  Anxiety: -Continue Klonopin 1 mg twice a day breakfast and dinner for anxiety Ativan 1 mg twice daily as needed for anxiety or sleep prior to patient's interview for group home placement.  Asthma: -Continue albuterol 2 puffs every hours  EPS prevention: -Continue Cogentin 2 mg twice daily for EPS prevention, and to maximize anticholinergic effect for assistance in decreasing urinary incontinence.  UTI prevention: -Continue Macrobid 100 mg daily -Reviewed UA from 08/23/2018, nonspecific for UTI.  -  Pyridium 100 mg 3 times daily with meals for complaints of dysuria.  Hypothyroid: -Continue Synthroid 50 mcg daily for hypothyroidism  GERD: -Continue Protonix 40 mg daily -Continue Pepcid 20 mg BID -Continue Carafate 1 gram with meals and at bedtime  Elevated triglycerides -Continue Fenofibrate 160 mg daily  Yeast infection prevention: Nystatin powder BID  We will encourage patient to makeurological/gynecology appointment after discharge.  Disposition: Continue working with her ACT team/Social work for placement in another group home setting, as patient is unlikely to be successful living independently. Patient is currently  staying in emergency department to receive assistance from social work on housing.    New placement pending completion of paperwork.  Mariel CraftSHEILA M Esaul Dorwart, MD 08/28/2018, 10:00 AM

## 2018-08-29 DIAGNOSIS — F4325 Adjustment disorder with mixed disturbance of emotions and conduct: Secondary | ICD-10-CM | POA: Diagnosis not present

## 2018-08-29 DIAGNOSIS — R4189 Other symptoms and signs involving cognitive functions and awareness: Secondary | ICD-10-CM | POA: Diagnosis not present

## 2018-08-29 DIAGNOSIS — F70 Mild intellectual disabilities: Secondary | ICD-10-CM | POA: Diagnosis not present

## 2018-08-29 DIAGNOSIS — F250 Schizoaffective disorder, bipolar type: Secondary | ICD-10-CM | POA: Diagnosis not present

## 2018-08-29 DIAGNOSIS — G809 Cerebral palsy, unspecified: Secondary | ICD-10-CM | POA: Diagnosis not present

## 2018-08-29 NOTE — ED Notes (Signed)
Pt pacing in hallway. Asking for information regarding new group home pt will be moving to in a few days. Requested someone to sit in her room with her to talk. Pt asked to go back to her exam room by ED tech. Verbalized understanding.

## 2018-08-29 NOTE — ED Notes (Addendum)
Pt observed lying in bed   Pt visualized with NAD  No verbalized needs or concerns at this time  Continue to monitor 

## 2018-08-29 NOTE — ED Notes (Signed)
ED Is the patient under IVC or is there intent for IVC: no  voluntary Is the patient medically cleared: Yes.   Is there vacancy in the ED BHU: Yes.  Unit closed Is the population mix appropriate for patient: Yes.   Is the patient awaiting placement in inpatient or outpatient setting: Yes. Social work is seeking group home placement  Has the patient had a psychiatric consult: Yes.   Survey of unit performed for contraband, proper placement and condition of furniture, tampering with fixtures in bathroom, shower, and each patient room: Yes.  ; Findings:  APPEARANCE/BEHAVIOR Calm and cooperative NEURO ASSESSMENT Orientation: oriented x3  Denies pain Hallucinations: No.None noted (Hallucinations) denies Speech: Normal Gait: normal RESPIRATORY ASSESSMENT Even  Unlabored respirations  CARDIOVASCULAR ASSESSMENT Pulses equal   regular rate  Skin warm and dry   GASTROINTESTINAL ASSESSMENT no GI complaint EXTREMITIES Full ROM  PLAN OF CARE Provide calm/safe environment. Vital signs assessed twice daily. ED BHU Assessment once each 12-hour shift. Collaborate with TTS when available or as condition indicates. Assure the ED provider has rounded once each shift. Provide and encourage hygiene. Provide redirection as needed. Assess for escalating behavior; address immediately and inform ED provider.  Assess family dynamic and appropriateness for visitation as needed: Yes.  ; If necessary, describe findings:  Educate the patient/family about BHU procedures/visitation: Yes.  ; If necessary, describe findings:

## 2018-08-29 NOTE — ED Notes (Signed)
Patient sitting on side of bed drinking milk at this time.

## 2018-08-29 NOTE — ED Notes (Signed)
BEHAVIORAL HEALTH ROUNDING Patient sleeping: Yes.   Patient alert and oriented: eyes closed  Appears to be asleep Behavior appropriate: Yes.  ; If no, describe:  Nutrition and fluids offered: Yes  Toileting and hygiene offered: sleeping Sitter present: q 15 minute observations and security monitoring Law enforcement present: yes  ODS 

## 2018-08-29 NOTE — ED Notes (Signed)
She has ambulated to the BR  - clean change of clothing and warm wipes provided  She has washed herself and dressed herself without incident  Deodorant provided  Pt states  "I just don't want to get under that water cause it is too cold - I'm cold"

## 2018-08-29 NOTE — TOC Progression Note (Addendum)
Transition of Care [facility]) - Progression Note    Patient Details  Name: [name] MRN: [id number] Date of Birth: 04-22-1965  [facility] CM/SW Contact  Tania Nat Lowenthal, LCSW Phone Number: 08/29/2018, 11:29 AM  Clinical Narrative:    11:28am - CSW attempted to contact pt's care coordinator to learn if there are any updates regarding group home placement for pt. Voicemail left.   1:37pm - Pt's care coordinator, [name], stated that she still does not have a time frame for when pt will be able to go to the group home. She stated that she is hoping to talk to the contracting person tomorrow (08/30/18) since they are out sick today. She will follow-up with CSW.      Expected Discharge Plan and Services                                                 Social Determinants of Health (SDOH) Interventions    Readmission Risk Interventions No flowsheet data found.

## 2018-08-29 NOTE — Progress Notes (Signed)
[facility] MD Progress Note  08/29/2018 12:32 PM [name]  MRN:  [id number] Subjective:  Patient asleep majority of shift. Principal Problem: Adjustment disorder with mixed disturbance of emotions and conduct Diagnosis: Principal Problem:   Adjustment disorder with mixed disturbance of emotions and conduct Active Problems:   Cerebral palsy (HCC)   Mild intellectual disability   GERD (gastroesophageal reflux disease)   COPD (chronic obstructive pulmonary disease) (HCC)   Schizoaffective disorder, bipolar type (HCC)   Suicidal ideation  Patient is seen, chart is reviewed.   Total Time spent with patient: 15 minutes   HPI:   On admission 08/08/18:  [name] is a 53 year old Caucasian female with a history of schizoaffective disorder, intellectual disability and borderline personality disorder.  She was seen in emergency department due to suicidal ideations, but once evaluated, she denied having them, and therefore she was discharged. Later on the same day, she came back expressing suicidal thoughts again, and racing thoughts.    Past Psychiatric History: Schizoaffective disorder, mild intellectual disability, multiple suicide attempts.  On evaluation, patient has been sleeping the majority of the day. Per nursing she was calm and cooperative participating in her ADLs.  She has been compliant with medication and has had no behavioral issues for many days.  She has not been requiring as needed medication. She has denied SI, HI, AVH.   Historically, she is chronically suicidal at times especially upon arriving to the ED or when she does not want to be at her current living situation, group homes or her family  Frequently comes to the ED when she does not get what she wants.   Caveat:  Cognitive impairment with low level of frustration tolerance.   Awaiting for group home placement. Care coordinator has gotten approval for higher level of care.  She has had interviews and has been accepted at  one facility, waiting to hear from the second.  Paperwork being completed.  Please see social work note.   Past Medical History:  Past Medical History:  Diagnosis Date  . Anxiety   . Asthma   . Borderline personality disorder (HCC)   . Cerebral palsy (HCC)   . COPD (chronic obstructive pulmonary disease) (HCC)   . Depression   . GERD (gastroesophageal reflux disease)   . Mild cognitive impairment   . Schizoaffective disorder (HCC)   . Wound abscess     Past Surgical History:  Procedure Laterality Date  . CHOLECYSTECTOMY    . INCISION AND DRAINAGE ABSCESS Right 01/02/2015   Procedure: INCISION AND DRAINAGE ABSCESS;  Surgeon: Tiney Rouge III, MD;  Location: [facility] ORS;  Service: General;  Laterality: Right;  . TONSILLECTOMY     Family History:  Family History  Problem Relation Age of Onset  . Heart attack Father   . Diabetes Mother    Family Psychiatric  History: None known  Social History:  Social History   Substance and Sexual Activity  Alcohol Use No     Social History   Substance and Sexual Activity  Drug Use No    Social History   Socioeconomic History  . Marital status: Single    Spouse name: Not on file  . Number of children: Not on file  . Years of education: Not on file  . Highest education level: Not on file  Occupational History  . Occupation: Disabled  Social Needs  . Financial resource strain: Not on file  . Food insecurity:    Worry: Not on file  Inability: Not on file  . Transportation needs:    Medical: Not on file    Non-medical: Not on file  Tobacco Use  . Smoking status: Former Games developer  . Smokeless tobacco: Never Used  Substance and Sexual Activity  . Alcohol use: No  . Drug use: No  . Sexual activity: Not Currently  Lifestyle  . Physical activity:    Days per week: Not on file    Minutes per session: Not on file  . Stress: Not on file  Relationships  . Social connections:    Talks on phone: Not on file    Gets together: Not on  file    Attends religious service: Not on file    Active member of club or organization: Not on file    Attends meetings of clubs or organizations: Not on file    Relationship status: Not on file  Other Topics Concern  . Not on file  Social History Narrative   Pt lives with mother, nephew, sister in law   Additional Social History:       Patient is essentially homeless at this time, as she previously was residing in a group home which had provided her 30-day notice. Her 30-day notice is completed.   Care coordinator has gotten approval for higher level of care.  Paperwork being completed for placement.   Sleep: Good  Appetite:  Good  Current Medications: Current Facility-Administered Medications  Medication Dose Route Frequency Provider Last Rate Last Dose  . acetaminophen (TYLENOL) tablet 650 mg  650 mg Oral Once Jeanmarie Plant, MD      . acetaminophen (TYLENOL) tablet 650 mg  650 mg Oral Q6H PRN Dionne Bucy, MD   650 mg at 08/28/18 2224  . acetaminophen (TYLENOL) tablet 650 mg  650 mg Oral Once Sharman Cheek, MD   Stopped at 08/23/18 1928  . benztropine (COGENTIN) tablet 2 mg  2 mg Oral BID Reggie Pile, MD   2 mg at 08/29/18 0945  . clonazePAM (KLONOPIN) tablet 1 mg  1 mg Oral BID Reggie Pile, MD   1 mg at 08/29/18 0947  . diphenhydrAMINE (BENADRYL) capsule 50 mg  50 mg Oral Q8H PRN Mariel Craft, MD   50 mg at 08/29/18 0945   Or  . diphenhydrAMINE (BENADRYL) injection 50 mg  50 mg Intramuscular Q8H PRN Mariel Craft, MD   50 mg at 08/24/18 2357  . famotidine (PEPCID) tablet 20 mg  20 mg Oral BID Reggie Pile, MD   20 mg at 08/29/18 0946  . fenofibrate tablet 160 mg  160 mg Oral Daily Reggie Pile, MD   160 mg at 08/29/18 0947  . fluvoxaMINE (LUVOX) tablet 150 mg  150 mg Oral QHS Mariel Craft, MD   150 mg at 08/28/18 2109  . levothyroxine (SYNTHROID) tablet 50 mcg  50 mcg Oral Q0600 Reggie Pile, MD   50 mcg at 08/29/18 0945  . LORazepam (ATIVAN) tablet  1 mg  1 mg Oral BID PRN Mariel Craft, MD       Or  . LORazepam (ATIVAN) injection 2 mg  2 mg Intramuscular BID PRN Mariel Craft, MD      . nitrofurantoin (MACRODANTIN) capsule 100 mg  100 mg Oral Q2000 Mariel Craft, MD   100 mg at 08/28/18 2108  . nystatin (MYCOSTATIN/NYSTOP) topical powder   Topical BID Emily Filbert, MD      . paliperidone [facility] SUSTENNA) injection 156 mg  156  mg Intramuscular Q28 days Mariel Craft, MD   156 mg at 08/28/18 2841  . pantoprazole (PROTONIX) EC tablet 40 mg  40 mg Oral Daily Reggie Pile, MD   40 mg at 08/29/18 0946  . phenazopyridine (PYRIDIUM) tablet 100 mg  100 mg Oral TID WC Mariel Craft, MD   100 mg at 08/29/18 0947  . risperiDONE (RISPERDAL) tablet 1 mg  1 mg Oral BID WC Mariel Craft, MD   1 mg at 08/29/18 0946  . risperiDONE (RISPERDAL) tablet 2 mg  2 mg Oral QHS Mariel Craft, MD   2 mg at 08/28/18 2110  . sucralfate (CARAFATE) tablet 1 g  1 g Oral TID WC & HS Reggie Pile, MD   1 g at 08/29/18 0945   Current Outpatient Medications  Medication Sig Dispense Refill  . ARIPiprazole (ABILIFY) 20 MG tablet Take 1 tablet (20 mg total) by mouth daily. 30 tablet 0  . benztropine (COGENTIN) 2 MG tablet Take 1 tablet (2 mg total) by mouth 2 (two) times daily. 60 tablet 0  . clonazePAM (KLONOPIN) 1 MG tablet Take 1 tablet (1 mg total) by mouth 2 (two) times daily. 30 tablet 0  . divalproex (DEPAKOTE ER) 500 MG 24 hr tablet Take 2 tablets (1,000 mg total) by mouth at bedtime. 60 tablet 0  . famotidine (PEPCID) 20 MG tablet Take 1 tablet (20 mg total) by mouth 2 (two) times daily. 60 tablet 0  . fenofibrate 160 MG tablet Take 1 tablet (160 mg total) by mouth daily. 30 tablet 1  . fluvoxaMINE (LUVOX) 100 MG tablet Take 1 tablet (100 mg total) by mouth at bedtime. 30 tablet 0  . levothyroxine (SYNTHROID) 50 MCG tablet Take 1 tablet (50 mcg total) by mouth daily before breakfast. 30 tablet 1  . nitrofurantoin (MACRODANTIN) 100 MG  capsule Take 1 capsule (100 mg total) by mouth 2 (two) times daily. 60 capsule 0  . OLANZapine (ZYPREXA) 5 MG tablet Take 1 tablet (5 mg total) by mouth every 8 (eight) hours as needed (agitation). 30 tablet 0  . pantoprazole (PROTONIX) 40 MG tablet Take 1 tablet (40 mg total) by mouth daily. 30 tablet 1  . sucralfate (CARAFATE) 1 g tablet Take 1 tablet (1 g total) by mouth 4 (four) times daily -  with meals and at bedtime. 120 tablet 1    Lab Results:  No results found for this or any previous visit (from the past 48 hour(s)).  Blood Alcohol level:  Lab Results  Component Value Date   ETH <10 08/09/2018   ETH <10 08/01/2018    Metabolic Disorder Labs: Lab Results  Component Value Date   HGBA1C 4.8 08/11/2016   MPG 91 08/11/2016   Lab Results  Component Value Date   PROLACTIN 111.7 (H) 08/11/2016   PROLACTIN 66.8 (H) 06/17/2015   Lab Results  Component Value Date   CHOL 178 08/11/2016   TRIG 402 (H) 08/11/2016   HDL 13 (L) 08/11/2016   CHOLHDL 13.7 08/11/2016   VLDL UNABLE TO CALCULATE IF TRIGLYCERIDE OVER 400 mg/dL 32/44/0102   LDLCALC UNABLE TO CALCULATE IF TRIGLYCERIDE OVER 400 mg/dL 72/53/6644   LDLCALC 82 06/17/2015    Musculoskeletal: Strength & Muscle Tone: within normal limits Gait & Station: normal Patient leans: N/A  Psychiatric Specialty Exam: Physical Exam  Nursing note and vitals reviewed. Constitutional: She appears well-developed and well-nourished. No distress.  HENT:  Head: Normocephalic and atraumatic.  Eyes: EOM are normal.  Neck: Normal range of motion.  Cardiovascular: Normal rate and regular rhythm.  Respiratory: Effort normal. No respiratory distress.  Musculoskeletal: Normal range of motion.  Neurological: She is alert.  Psychiatric: Her behavior is normal. Her speech is clear and coherent with normal rate, tone, and volume. Cognition and memory are fair. She exhibits a euthymic mood. She expresses vague suicidal ideation, and homicidal  ideation. She expresses no suicidal or homicidal plans or intent   Review of Systems  Constitutional: Negative Respiratory: Negative.   Cardiovascular: Negative.   Gastrointestinal: Negative.   Genitourinary: Positive for frequency and urgency.  Patient reports dysuria has resolved.      Incontinence  Musculoskeletal: Negative Neurological: Negative.   Psychiatric/Behavioral: Vague suicidal ideation, auditory and visual hallucinations. No HI. The patient is nervous at baseline. The patient does not have insomnia.     Blood pressure 130/82, pulse 84, temperature 98 F (36.7 C), temperature source Oral, resp. rate 17, last menstrual period 05/28/2018, SpO2 98 %.There is no height or weight on file to calculate BMI.  General Appearance: Casual  Eye Contact:  Good  Speech:  Clear and Coherent  Volume:  Normal  Mood:  Anxious and Euthymic  Affect:  Appropriate  Thought Process:  Coherent  Orientation:  Full (Time, Place, and Person)  Thought Content:  Logical   Suicidal Thoughts:  No  Homicidal Thoughts:  Denies  Memory:  Fair  Judgement:  Fair  Insight:  Shallow  Psychomotor Activity:  Normal  Concentration:  Concentration: Fair  Recall:  Fair  Fund of Knowledge:  Fair  Language:  Good  Akathisia:  No  Handed:  Right  AIMS (if indicated):     Assets:  Financial Resources/Insurance  ADL's:  Impaired  Cognition:  Impaired,  Mild  Sleep:   Adequate    Treatment Plan Summary: Plan Of Care/Follow-up recommendations:  Daily contact with patient to assess and evaluate symptoms and progress in treatment and Medication management Adjustment disorder with mixed disturbance of emotions and conduct; Schizoaffective disorder, depressive disorder: Continue Risperdal 1 mg daily with morning and evening meals and 2 mg.  These can be changed to as needed doses after Invega reaches steady state via long-acting injectable formulation. Hinda GlatterInvega Sustenna 234 mg intramuscularly 08/24/2018  To be  followed by 156 mg on Aug 28, 2018 then dose to be determined by outpatient psychiatrist and provided to patient every 28 days. -Continue Luvox150 mg at bedtime for ruminations, anxiety and depressive disorder  Agitation: Change to as needed Risperdal 1 mg every 8 hours as needed for agitation, in order to maintain single agent. -Continue Benadryl 50 mg every 8 hours PRN agitation  Anxiety: -Continue Klonopin 1 mg twice a day breakfast and dinner for anxiety Ativan 1 mg twice daily as needed for anxiety or sleep prior to patient's interview for group home placement.  Asthma: -Continue albuterol 2 puffs every hours  EPS prevention: -Continue Cogentin 2 mg twice daily for EPS prevention, and to maximize anticholinergic effect for assistance in decreasing urinary incontinence.  UTI prevention: -Continue Macrobid 100 mg daily -Reviewed UA from 08/23/2018, nonspecific for UTI.  -Pyridium 100 mg 3 times daily with meals for complaints of dysuria.  Hypothyroid: -Continue Synthroid 50 mcg daily for hypothyroidism  GERD: -Continue Protonix 40 mg daily -Continue Pepcid 20 mg BID -Continue Carafate 1 gram with meals and at bedtime  Elevated triglycerides -Continue Fenofibrate 160 mg daily  Yeast infection prevention: Nystatin powder BID  We will encourage patient to makeurological/gynecology appointment  after discharge.  Disposition: Continue working with her ACT team/Social work for placement in another group home setting, as patient is unlikely to be successful living independently. Patient is currently staying in emergency department to receive assistance from social work on housing.    New placement pending completion of paperwork.  Mariel Craft, MD 08/29/2018, 12:32 PM

## 2018-08-29 NOTE — ED Notes (Signed)

## 2018-08-29 NOTE — ED Notes (Signed)
BEHAVIORAL HEALTH ROUNDING Patient sleeping: No. Patient alert and oriented: yes Behavior appropriate: Yes.  ; If no, describe:  Nutrition and fluids offered: yes Toileting and hygiene offered: Yes  Sitter present: q15 minute observations and security  monitoring Law enforcement present: Yes  ODS  

## 2018-08-29 NOTE — ED Notes (Signed)
Assessment completed  - meds administered as ordered  Snack and milk provided  She is awaiting paperwork to be completed so that she may enter a new group home  Continue to monitor

## 2018-08-29 NOTE — ED Notes (Signed)
Patient ate a few bites of supper, she is alert and oriented, complaining of being cold, nurse moved her bed over from under the vent to aid in warmth, also gave extra blankets, Patient is calm and cooperative,nurse will continue to monitor.

## 2018-08-29 NOTE — ED Provider Notes (Signed)
-----------------------------------------   1:00 PM on 08/29/2018 -----------------------------------------   BP 130/82   Pulse 84   Temp 98 F (36.7 C) (Oral)   Resp 17   LMP 05/28/2018 (LMP Unknown)   SpO2 98%   No acute events since last update.  Disposition is pending per Psychiatry/Behavioral Medicine team recommendations.      Phineas Semen, MD 08/29/18 1300

## 2018-08-29 NOTE — ED Notes (Signed)
Pt walked to the bathroom with this tech. Pt bedding changed, and pt's clothing changed by this tech, and Mudlogger. Patient taken back to room by this Tech.

## 2018-08-29 NOTE — ED Notes (Signed)
Patient is sitting on side of bed wrapped up in 2 blankets, she is cooperative, she denies Si/hi or avh at this time.

## 2018-08-29 NOTE — ED Notes (Signed)
BEHAVIORAL HEALTH ROUNDING Patient sleeping: Yes.   Patient alert and oriented: eyes closed  Appears asleep Behavior appropriate: Yes.  ; If no, describe:  Nutrition and fluids offered: Yes  Toileting and hygiene offered: sleeping Sitter present: q 15 minute observations and security monitoring Law enforcement present: yes  ODS 

## 2018-08-29 NOTE — ED Notes (Signed)
Patient is sleeping, no signs of distress, will continue to monitor.  

## 2018-08-30 DIAGNOSIS — R4189 Other symptoms and signs involving cognitive functions and awareness: Secondary | ICD-10-CM | POA: Diagnosis not present

## 2018-08-30 NOTE — ED Provider Notes (Signed)
-----------------------------------------   6:52 AM on 08/30/2018 -----------------------------------------   Blood pressure (!) 95/48, pulse (!) 113, temperature 97.6 F (36.4 C), temperature source Oral, resp. rate (!) 26, last menstrual period 05/28/2018, SpO2 91 %.  The patient is sleeping at this time.  There have been no acute events since the last update.  Awaiting disposition plan from Behavioral Medicine team.   Irean Hong, MD 08/30/18 [phone]

## 2018-08-30 NOTE — ED Notes (Signed)
Pt asking when she may receive more pain medication. Pt received tylenol just before 2200. Pt given a warm blanket to place on the back of her neck to relieve pain and allow pt to go back to sleep.

## 2018-08-30 NOTE — ED Notes (Signed)
Patient in bathroom at this time taking bath ,clean scrubs and washing supplies was given

## 2018-08-30 NOTE — TOC Progression Note (Addendum)
Transition of Care [facility]) - Progression Note    Patient Details  Name: [name] MRN: [id number] Date of Birth: 04-Sep-1965  Transition of Care [facility]) CM/SW Contact  Darleene Cleaver, [place] Phone Number: 08/30/2018, 4:30 PM  Clinical Narrative:     CSW received phone call back from [facility] Patient's care coordinator [phone] she said she is completing a person centered plan that will have to be signed by patient and a psychiatrist on Monday.  CSW informed her that the weekday CSW can call her on Monday with the psychiatrist contact info to see if they are willing to sign on the plan for her.  She stated the group home is working on completing the contract with [employer] to get funding approved for patient.  [name] said she does not have a date when everything is finished she is hopeful it will be next week.    CSW updated patient on progress of discharge planning.    Expected Discharge Plan and Services    To a family care home.                                             Social Determinants of Health (SDOH) Interventions    Readmission Risk Interventions No flowsheet data found.

## 2018-08-30 NOTE — ED Notes (Signed)
Care assumed

## 2018-08-30 NOTE — ED Notes (Signed)
Hourly rounding reveals patient sleeping in room. No complaints, stable, in no acute distress. Q15 minute rounds and monitoring via Security to continue. 

## 2018-08-31 DIAGNOSIS — R4189 Other symptoms and signs involving cognitive functions and awareness: Secondary | ICD-10-CM | POA: Diagnosis not present

## 2018-08-31 NOTE — ED Notes (Signed)
Pt up to bathroom and to throw her trash away.

## 2018-08-31 NOTE — ED Notes (Signed)
Pt urinated in bed.  Bed changed by staff.  Pt given towels and soap to clean self up and a clean pair of scrubs.  Pt in bathroom at this time.

## 2018-08-31 NOTE — ED Notes (Signed)
Pt complaining of neck pain.  Offered patient Tylenol.  Pt refused.

## 2018-08-31 NOTE — ED Notes (Signed)
Pt laying in bed, requesting that staff throw her tray away for her.  Informed pt that she needed to get up and throw her tray away herself.

## 2018-08-31 NOTE — ED Notes (Signed)
Pt given lunch tray.

## 2018-08-31 NOTE — ED Notes (Signed)
Pt urinated in bed.  Bed changed by staff.  Pt in bathroom at this time getting cleaned up and putting on a clean pair of scrubs.

## 2018-09-01 DIAGNOSIS — R4189 Other symptoms and signs involving cognitive functions and awareness: Secondary | ICD-10-CM | POA: Diagnosis not present

## 2018-09-01 NOTE — ED Notes (Signed)
BEHAVIORAL HEALTH ROUNDING Patient sleeping: No. Patient alert and oriented: yes Behavior appropriate: Yes.  ; If no, describe:  Nutrition and fluids offered: yes Toileting and hygiene offered: Yes  Sitter present: q15 minute observations and security  monitoring Law enforcement present: Yes  ODS  

## 2018-09-01 NOTE — ED Notes (Signed)
Hourly rounding reveals patient in room. No complaints, stable, in no acute distress. Q15 minute rounds and monitoring via Rover and Officer to continue.   

## 2018-09-01 NOTE — ED Notes (Signed)
Report to include Situation, Background, Assessment, and Recommendations received from [facility]. Patient alert, warm and dry, in no acute distress. Patient denies VH and pain. Patient states she hears "voices" with out command, has SI without a plan and HI towards group home staff. Patient made aware of Q15 minute rounds and Psychologist, counselling presence for their safety. Patient instructed to come to me with needs or concerns.

## 2018-09-01 NOTE — ED Notes (Signed)

## 2018-09-01 NOTE — ED Notes (Signed)
Hourly rounding reveals patient sleeping in room. No complaints, stable, in no acute distress. Q15 minute rounds and monitoring via Rover and Officer to continue.  

## 2018-09-01 NOTE — ED Notes (Signed)
Hourly rounding reveals patient in room. Stable, in no acute distress. Q15 minute rounds and monitoring via Rover and Officer to continue.  

## 2018-09-01 NOTE — ED Notes (Signed)
ED  Is the patient under IVC or is there intent for IVC:  Voluntary  Is the patient medically cleared: Yes.   Is there vacancy in the ED BHU:  Unit closed   Is the population mix appropriate for patient: Yes.   Is the patient awaiting placement in inpatient or outpatient setting: Yes. she is awaiting placement in a group home   Has the patient had a psychiatric consult: Yes.   Survey of unit performed for contraband, proper placement and condition of furniture, tampering with fixtures in bathroom, shower, and each patient room: Yes.  ; Findings:  APPEARANCE/BEHAVIOR Calm and cooperative NEURO ASSESSMENT Orientation: oriented x3  Denies pain Hallucinations: No.None noted (Hallucinations)  denies Speech: Normal Gait: normal RESPIRATORY ASSESSMENT Even  Unlabored respirations  CARDIOVASCULAR ASSESSMENT Pulses equal   regular rate  Skin warm and dry   GASTROINTESTINAL ASSESSMENT no GI complaint EXTREMITIES Full ROM  PLAN OF CARE Provide calm/safe environment. Vital signs assessed twice daily. ED BHU Assessment once each 12-hour shift. Collaborate with TTS if availableor as condition indicates. Assure the ED provider has rounded once each shift. Provide and encourage hygiene. Provide redirection as needed. Assess for escalating behavior; address immediately and inform ED provider.  Assess family dynamic and appropriateness for visitation as needed: Yes.  ; If necessary, describe findings:  Educate the patient/family about BHU procedures/visitation: Yes.  ; If necessary, describe findings:

## 2018-09-01 NOTE — ED Notes (Signed)
She is currently taking a shower - linens changed

## 2018-09-01 NOTE — ED Provider Notes (Signed)
-----------------------------------------   7:10 AM on 09/01/2018 -----------------------------------------   Blood pressure 95/60, pulse (!) 101, temperature 98.2 F (36.8 C), temperature source Oral, resp. rate 18, last menstrual period 05/28/2018, SpO2 95 %.  The patient is calm and cooperative at this time.  There have been no acute events since the last update.  Awaiting disposition plan from Behavioral Medicine team.   Dionne Bucy, MD 09/01/18 [phone]

## 2018-09-01 NOTE — ED Notes (Signed)
Patient observed lying in bed with eyes closed  Even, unlabored respirations observed   NAD pt appears to be sleeping  I will continue to monitor along with every 15 minute visual observations and ongoing security monitoring    

## 2018-09-02 ENCOUNTER — Emergency Department: Payer: Medicare Other

## 2018-09-02 DIAGNOSIS — F250 Schizoaffective disorder, bipolar type: Secondary | ICD-10-CM | POA: Diagnosis not present

## 2018-09-02 DIAGNOSIS — R4189 Other symptoms and signs involving cognitive functions and awareness: Secondary | ICD-10-CM | POA: Diagnosis not present

## 2018-09-02 DIAGNOSIS — F70 Mild intellectual disabilities: Secondary | ICD-10-CM | POA: Diagnosis not present

## 2018-09-02 DIAGNOSIS — F4325 Adjustment disorder with mixed disturbance of emotions and conduct: Secondary | ICD-10-CM | POA: Diagnosis not present

## 2018-09-02 DIAGNOSIS — G809 Cerebral palsy, unspecified: Secondary | ICD-10-CM | POA: Diagnosis not present

## 2018-09-02 LAB — CBC WITH DIFFERENTIAL/PLATELET
Abs Immature Granulocytes: 0.04 10*3/uL (ref 0.00–0.07)
Basophils Absolute: 0.1 10*3/uL (ref 0.0–0.1)
Basophils Relative: 1 %
Eosinophils Absolute: 0.2 10*3/uL (ref 0.0–0.5)
Eosinophils Relative: 3 %
HCT: 34.2 % — ABNORMAL LOW (ref 36.0–46.0)
Hemoglobin: 11 g/dL — ABNORMAL LOW (ref 12.0–15.0)
Immature Granulocytes: 1 %
Lymphocytes Relative: 29 %
Lymphs Abs: 1.8 10*3/uL (ref 0.7–4.0)
MCH: 32.6 pg (ref 26.0–34.0)
MCHC: 32.2 g/dL (ref 30.0–36.0)
MCV: 101.5 fL — ABNORMAL HIGH (ref 80.0–100.0)
Monocytes Absolute: 0.7 10*3/uL (ref 0.1–1.0)
Monocytes Relative: 12 %
Neutro Abs: 3.4 10*3/uL (ref 1.7–7.7)
Neutrophils Relative %: 54 %
Platelets: 347 10*3/uL (ref 150–400)
RBC: 3.37 MIL/uL — ABNORMAL LOW (ref 3.87–5.11)
RDW: 13.7 % (ref 11.5–15.5)
WBC: 6.2 10*3/uL (ref 4.0–10.5)
nRBC: 0 % (ref 0.0–0.2)

## 2018-09-02 LAB — COMPREHENSIVE METABOLIC PANEL
ALT: 26 U/L (ref 0–44)
AST: 32 U/L (ref 15–41)
Albumin: 3.2 g/dL — ABNORMAL LOW (ref 3.5–5.0)
Alkaline Phosphatase: 52 U/L (ref 38–126)
Anion gap: 9 (ref 5–15)
BUN: 23 mg/dL — ABNORMAL HIGH (ref 6–20)
CO2: 25 mmol/L (ref 22–32)
Calcium: 9 mg/dL (ref 8.9–10.3)
Chloride: 107 mmol/L (ref 98–111)
Creatinine, Ser: 0.77 mg/dL (ref 0.44–1.00)
GFR calc Af Amer: >60 mL/min (ref >60)
GFR calc non Af Amer: >60 mL/min (ref >60)
Glucose, Bld: 123 mg/dL — ABNORMAL HIGH (ref 70–99)
Potassium: 4 mmol/L (ref 3.5–5.1)
Sodium: 141 mmol/L (ref 135–145)
Total Bilirubin: 0.2 mg/dL — ABNORMAL LOW (ref 0.3–1.2)
Total Protein: 6.8 g/dL (ref 6.5–8.1)

## 2018-09-02 LAB — CK: Total CK: 392 U/L — ABNORMAL HIGH (ref 38–234)

## 2018-09-02 LAB — TSH: TSH: 6.72 u[IU]/mL — ABNORMAL HIGH (ref 0.350–4.500)

## 2018-09-02 LAB — SEDIMENTATION RATE: Sed Rate: 63 mm/hr — ABNORMAL HIGH (ref 0–30)

## 2018-09-02 LAB — LACTIC ACID, PLASMA: Lactic Acid, Venous: 1.6 mmol/L (ref 0.5–1.9)

## 2018-09-02 MED ORDER — CEPHALEXIN 500 MG PO CAPS
500.0000 mg | ORAL_CAPSULE | Freq: Three times a day (TID) | ORAL | Status: DC
  Administered 2018-09-02 – 2018-09-13 (×30): 500 mg via ORAL
  Filled 2018-09-02 (×34): qty 1

## 2018-09-02 NOTE — ED Notes (Signed)
Pt's bed wiped down/ cleaned.  New sheets provided.

## 2018-09-02 NOTE — ED Provider Notes (Signed)
Patient's lab work returned she does have a UTI.  I will try some Keflex.  Her penicillin allergy was vomiting.  She should be okay with Keflex.  Her sed rate is elevated but it has been.  We will see how she does with the Keflex and go from there.   Arnaldo Natal, MD 09/02/18 2330

## 2018-09-02 NOTE — ED Notes (Signed)
Hourly rounding reveals patient sleeping in room. No complaints, stable, in no acute distress. Q15 minute rounds and monitoring via Rover and Officer to continue.  

## 2018-09-02 NOTE — ED Notes (Signed)
Pt tried to provide urine specimen and soiled clothing. Given new clothing to change into.

## 2018-09-02 NOTE — ED Notes (Signed)
Hourly rounding reveals patient in room. No complaints, stable, in no acute distress. Q15 minute rounds and monitoring via Rover and Officer to continue.   

## 2018-09-02 NOTE — ED Notes (Signed)
Pt had episode of incontinence. Strongly encouraged to take a shower. Clean scrubs and shower supplies given to pt in bathroom.

## 2018-09-02 NOTE — ED Notes (Signed)
Offered pt a shower, pt is refusing at this time. Pt asking for chocolate milk. Pt made aware that she can have chocolate milk once she gets up and goes to the bathroom d/t pt urinating on self and bed because she "doesn't want to get out of bed." Pt constantly calling out from bed for staff to throw away her breakfast tray. Pt is capable of doing this herself but states "I don't want to get out of bed to do anything because my head hurts." Pt however refused pain meds from Amy, RN prior to this.

## 2018-09-02 NOTE — ED Notes (Addendum)
Patient urinated in bed, clothing changed and chucks changed

## 2018-09-02 NOTE — ED Notes (Signed)
Report to include Situation, Background, Assessment, and Recommendations received from [facility]. Patient alert, warm and dry, in no acute distress. Patient denies VH and pain. Patient states she has SI without plan and AH without command. Patient made aware of Q15 minute rounds and Psychologist, counselling presence for their safety. Patient instructed to come to me with needs or concerns.

## 2018-09-02 NOTE — ED Notes (Signed)
Pt given breakfast tray

## 2018-09-02 NOTE — ED Notes (Signed)
Pt is sitting on toilet and has not taken a shower yet. Pt was asked why hasn't she taken a shower and pt stated "I am just going to have a wash up at the sink." Pt strongly encouraged to take a shower d/t smelling real strongly of urine. Pt just sitting on toilet.

## 2018-09-02 NOTE — ED Notes (Signed)
Pt stating that she has SI and HI, pt stated "I wish that I was dead" RN notified

## 2018-09-02 NOTE — ED Provider Notes (Signed)
Patient complains of feeling weak and aching all over from her neck down.  Especially her arms and her legs.  Blood pressures been low for the last couple days and she is a little bit tachycardic now.  I will get some tests going and try to make sure there is nothing acute going on with her like another UTI.   Arnaldo Natal, MD 09/02/18 [phone]

## 2018-09-02 NOTE — Progress Notes (Signed)
[facility] MD Progress Note  09/02/2018 11:52 AM [name]  MRN:  [id number] Subjective:  Patient asleep majority of shift. Principal Problem: Adjustment disorder with mixed disturbance of emotions and conduct Diagnosis: Principal Problem:   Adjustment disorder with mixed disturbance of emotions and conduct Active Problems:   Cerebral palsy (HCC)   Mild intellectual disability   GERD (gastroesophageal reflux disease)   COPD (chronic obstructive pulmonary disease) (HCC)   Schizoaffective disorder, bipolar type (HCC)   Suicidal ideation  Patient is seen, chart is reviewed.   Total Time spent with patient: 15 minutes   HPI:   On admission 08/08/18:  [name] is a 53 year old Caucasian female with a history of schizoaffective disorder, intellectual disability and borderline personality disorder.  She was seen in emergency department due to suicidal ideations, but once evaluated, she denied having them, and therefore she was discharged. Later on the same day, she came back expressing suicidal thoughts again, and racing thoughts.    Past Psychiatric History: Schizoaffective disorder, mild intellectual disability, multiple suicide attempts.  On evaluation, patient has been awake and interactive.  She reports somatic complaints of neck, shoulder, muscle aches.  She is denying any SI, HI, AVH.  She is reporting vivid dreams, but states they are pleasant.  Patient requires encouragement to complete her ADLs.  Her overall mood is stated as good.  She is eagerly anticipating being discharged to a group home, and hopes that soon. She has been compliant with medication and has had no behavioral issues for many days.  She has not been requiring as needed medication.    Historically, she is chronically suicidal at times especially upon arriving to the ED or when she does not want to be at her current living situation, group homes or her family  Frequently comes to the ED when she does not get what she  wants.   Caveat:  Cognitive impairment with low level of frustration tolerance.   Awaiting for group home placement. Care coordinator has gotten approval for higher level of care.  She has had interviews and has been accepted at one facility, waiting to hear from the second.  Paperwork being completed.  Please see social work note.   Past Medical History:  Past Medical History:  Diagnosis Date  . Anxiety   . Asthma   . Borderline personality disorder (HCC)   . Cerebral palsy (HCC)   . COPD (chronic obstructive pulmonary disease) (HCC)   . Depression   . GERD (gastroesophageal reflux disease)   . Mild cognitive impairment   . Schizoaffective disorder (HCC)   . Wound abscess     Past Surgical History:  Procedure Laterality Date  . CHOLECYSTECTOMY    . INCISION AND DRAINAGE ABSCESS Right 01/02/2015   Procedure: INCISION AND DRAINAGE ABSCESS;  Surgeon: Tiney Rouge III, MD;  Location: [facility] ORS;  Service: General;  Laterality: Right;  . TONSILLECTOMY     Family History:  Family History  Problem Relation Age of Onset  . Heart attack Father   . Diabetes Mother    Family Psychiatric  History: None known  Social History:  Social History   Substance and Sexual Activity  Alcohol Use No     Social History   Substance and Sexual Activity  Drug Use No    Social History   Socioeconomic History  . Marital status: Single    Spouse name: Not on file  . Number of children: Not on file  . Years of education: Not on file  .  Highest education level: Not on file  Occupational History  . Occupation: Disabled  Social Needs  . Financial resource strain: Not on file  . Food insecurity:    Worry: Not on file    Inability: Not on file  . Transportation needs:    Medical: Not on file    Non-medical: Not on file  Tobacco Use  . Smoking status: Former Games developer  . Smokeless tobacco: Never Used  Substance and Sexual Activity  . Alcohol use: No  . Drug use: No  . Sexual activity: Not  Currently  Lifestyle  . Physical activity:    Days per week: Not on file    Minutes per session: Not on file  . Stress: Not on file  Relationships  . Social connections:    Talks on phone: Not on file    Gets together: Not on file    Attends religious service: Not on file    Active member of club or organization: Not on file    Attends meetings of clubs or organizations: Not on file    Relationship status: Not on file  Other Topics Concern  . Not on file  Social History Narrative   Pt lives with mother, nephew, sister in law   Additional Social History:       Patient is essentially homeless at this time, as she previously was residing in a group home which had provided her 30-day notice. Her 30-day notice is completed.   Care coordinator has gotten approval for higher level of care.  Paperwork being completed for placement.   Sleep: Good  Appetite:  Good  Current Medications: Current Facility-Administered Medications  Medication Dose Route Frequency Provider Last Rate Last Dose  . acetaminophen (TYLENOL) tablet 650 mg  650 mg Oral Once Jeanmarie Plant, MD      . acetaminophen (TYLENOL) tablet 650 mg  650 mg Oral Q6H PRN Dionne Bucy, MD   650 mg at 09/01/18 2039  . acetaminophen (TYLENOL) tablet 650 mg  650 mg Oral Once Sharman Cheek, MD   Stopped at 08/23/18 1928  . benztropine (COGENTIN) tablet 2 mg  2 mg Oral BID Reggie Pile, MD   2 mg at 09/02/18 1610  . clonazePAM (KLONOPIN) tablet 1 mg  1 mg Oral BID Reggie Pile, MD   1 mg at 09/02/18 0904  . diphenhydrAMINE (BENADRYL) capsule 50 mg  50 mg Oral Q8H PRN Mariel Craft, MD   50 mg at 09/01/18 1304   Or  . diphenhydrAMINE (BENADRYL) injection 50 mg  50 mg Intramuscular Q8H PRN Mariel Craft, MD   50 mg at 08/24/18 2357  . famotidine (PEPCID) tablet 20 mg  20 mg Oral BID Reggie Pile, MD   20 mg at 09/02/18 0904  . fenofibrate tablet 160 mg  160 mg Oral Daily Reggie Pile, MD   160 mg at 09/02/18 0904   . fluvoxaMINE (LUVOX) tablet 150 mg  150 mg Oral QHS Mariel Craft, MD   150 mg at 09/01/18 2102  . levothyroxine (SYNTHROID) tablet 50 mcg  50 mcg Oral Q0600 Reggie Pile, MD   50 mcg at 09/02/18 [phone]  . LORazepam (ATIVAN) tablet 1 mg  1 mg Oral BID PRN Mariel Craft, MD       Or  . LORazepam (ATIVAN) injection 2 mg  2 mg Intramuscular BID PRN Mariel Craft, MD      . nitrofurantoin (MACRODANTIN) capsule 100 mg  100 mg Oral Q2000 Viviano Simas,  Orlean Bradford, MD   100 mg at 09/01/18 2102  . nystatin (MYCOSTATIN/NYSTOP) topical powder   Topical BID Emily Filbert, MD      . paliperidone [facility] SUSTENNA) injection 156 mg  156 mg Intramuscular Q28 days Mariel Craft, MD   156 mg at 08/28/18 0509  . pantoprazole (PROTONIX) EC tablet 40 mg  40 mg Oral Daily Reggie Pile, MD   40 mg at 09/02/18 1610  . risperiDONE (RISPERDAL) tablet 1 mg  1 mg Oral BID WC Mariel Craft, MD   1 mg at 09/02/18 0904  . risperiDONE (RISPERDAL) tablet 2 mg  2 mg Oral QHS Mariel Craft, MD   2 mg at 09/01/18 2105  . sucralfate (CARAFATE) tablet 1 g  1 g Oral TID WC & HS Reggie Pile, MD   1 g at 09/02/18 9604   Current Outpatient Medications  Medication Sig Dispense Refill  . ARIPiprazole (ABILIFY) 20 MG tablet Take 1 tablet (20 mg total) by mouth daily. 30 tablet 0  . benztropine (COGENTIN) 2 MG tablet Take 1 tablet (2 mg total) by mouth 2 (two) times daily. 60 tablet 0  . clonazePAM (KLONOPIN) 1 MG tablet Take 1 tablet (1 mg total) by mouth 2 (two) times daily. 30 tablet 0  . divalproex (DEPAKOTE ER) 500 MG 24 hr tablet Take 2 tablets (1,000 mg total) by mouth at bedtime. 60 tablet 0  . famotidine (PEPCID) 20 MG tablet Take 1 tablet (20 mg total) by mouth 2 (two) times daily. 60 tablet 0  . fenofibrate 160 MG tablet Take 1 tablet (160 mg total) by mouth daily. 30 tablet 1  . fluvoxaMINE (LUVOX) 100 MG tablet Take 1 tablet (100 mg total) by mouth at bedtime. 30 tablet 0  . levothyroxine (SYNTHROID) 50 MCG  tablet Take 1 tablet (50 mcg total) by mouth daily before breakfast. 30 tablet 1  . nitrofurantoin (MACRODANTIN) 100 MG capsule Take 1 capsule (100 mg total) by mouth 2 (two) times daily. 60 capsule 0  . OLANZapine (ZYPREXA) 5 MG tablet Take 1 tablet (5 mg total) by mouth every 8 (eight) hours as needed (agitation). 30 tablet 0  . pantoprazole (PROTONIX) 40 MG tablet Take 1 tablet (40 mg total) by mouth daily. 30 tablet 1  . sucralfate (CARAFATE) 1 g tablet Take 1 tablet (1 g total) by mouth 4 (four) times daily -  with meals and at bedtime. 120 tablet 1    Lab Results:  No results found for this or any previous visit (from the past 48 hour(s)).  Blood Alcohol level:  Lab Results  Component Value Date   ETH <10 08/09/2018   ETH <10 08/01/2018    Metabolic Disorder Labs: Lab Results  Component Value Date   HGBA1C 4.8 08/11/2016   MPG 91 08/11/2016   Lab Results  Component Value Date   PROLACTIN 111.7 (H) 08/11/2016   PROLACTIN 66.8 (H) 06/17/2015   Lab Results  Component Value Date   CHOL 178 08/11/2016   TRIG 402 (H) 08/11/2016   HDL 13 (L) 08/11/2016   CHOLHDL 13.7 08/11/2016   VLDL UNABLE TO CALCULATE IF TRIGLYCERIDE OVER 400 mg/dL 54/12/8117   LDLCALC UNABLE TO CALCULATE IF TRIGLYCERIDE OVER 400 mg/dL 14/78/2956   LDLCALC 82 06/17/2015    Musculoskeletal: Strength & Muscle Tone: within normal limits Gait & Station: normal Patient leans: N/A  Psychiatric Specialty Exam: Physical Exam  Nursing note and vitals reviewed. Constitutional: She appears well-developed and well-nourished. No  distress.  HENT:  Head: Normocephalic and atraumatic.  Eyes: EOM are normal.  Neck: Normal range of motion.  Cardiovascular: Normal rate and regular rhythm.  Respiratory: Effort normal. No respiratory distress.  Musculoskeletal: Normal range of motion.  Neurological: She is alert.  Psychiatric: Her behavior is normal. Her speech is clear and coherent with normal rate, tone, and  volume. Cognition and memory are fair. She exhibits a euthymic mood. She expresses vague suicidal ideation, and homicidal ideation. She expresses no suicidal or homicidal plans or intent   Review of Systems  Constitutional: Negative Respiratory: Negative.   Cardiovascular: Negative.   Gastrointestinal: Negative.   Genitourinary: Positive for Incontinence  Musculoskeletal: Complains of muscle aches Neurological: Negative.   Psychiatric/Behavioral: Vague suicidal ideation, auditory and visual hallucinations. No HI. The patient is nervous at baseline. The patient does not have insomnia.     Blood pressure 109/77, pulse 86, temperature 97.7 F (36.5 C), temperature source Oral, resp. rate 20, last menstrual period 05/28/2018, SpO2 96 %.There is no height or weight on file to calculate BMI.  General Appearance: Casual  Eye Contact:  Good  Speech:  Clear and Coherent  Volume:  Normal  Mood:  Euthymic  Affect:  Appropriate  Thought Process:  Coherent  Orientation:  Full (Time, Place, and Person)  Thought Content:  Logical   Suicidal Thoughts:  No  Homicidal Thoughts:  Denies  Memory:  Fair  Judgement:  Fair  Insight:  Shallow  Psychomotor Activity:  Normal  Concentration:  Concentration: Fair  Recall:  Fair  Fund of Knowledge:  Fair  Language:  Good  Akathisia:  No  Handed:  Right  AIMS (if indicated):     Assets:  Financial Resources/Insurance  ADL's:  Impaired  Cognition:  Impaired,  Mild  Sleep:   Adequate    Treatment Plan Summary: Plan Of Care/Follow-up recommendations:  Daily contact with patient to assess and evaluate symptoms and progress in treatment and Medication management Adjustment disorder with mixed disturbance of emotions and conduct; Schizoaffective disorder, depressive disorder: Continue Risperdal 1 mg daily with morning and evening meals and 2 mg.  These can be changed to as needed doses after Invega reaches steady state via long-acting injectable  formulation. Hinda GlatterInvega Sustenna 234 mg intramuscularly 08/24/2018  To be followed by 156 mg on Aug 28, 2018 then dose to be determined by outpatient psychiatrist and provided to patient every 28 days. -Continue Luvox150 mg at bedtime for ruminations, anxiety and depressive disorder  Agitation: Change to as needed Risperdal 1 mg every 8 hours as needed for agitation, in order to maintain single agent. -Continue Benadryl 50 mg every 8 hours PRN agitation  Anxiety: -Continue Klonopin 1 mg twice a day breakfast and dinner for anxiety Ativan 1 mg twice daily as needed for anxiety or sleep prior to patient's interview for group home placement.  Asthma: -Continue albuterol 2 puffs every hours  EPS prevention: -Continue Cogentin 2 mg twice daily for EPS prevention, and to maximize anticholinergic effect for assistance in decreasing urinary incontinence.  UTI prevention: -Continue Macrobid 100 mg daily -Reviewed UA from 08/23/2018, nonspecific for UTI.  -Discontinue Pyridium -Monitor for further bladder symptoms.  Hypothyroid: -Continue Synthroid 50 mcg daily for hypothyroidism  GERD: -Continue Protonix 40 mg daily -Continue Pepcid 20 mg BID -Continue Carafate 1 gram with meals and at bedtime  Elevated triglycerides -Continue Fenofibrate 160 mg daily  Yeast infection prevention: Nystatin powder BID  We will encourage patient to makeurological/gynecology appointment after discharge.  Disposition:  Continue working with her ACT team/Social work for placement in another group home setting, as patient is unlikely to be successful living independently. Patient is currently staying in emergency department to receive assistance from social work on housing.    New placement pending completion of paperwork.  Additional paperwork is signed and completed today and moving forward with processing for discharge to group home.  Mariel Craft, MD 09/02/2018, 11:52 AM

## 2018-09-02 NOTE — TOC Progression Note (Addendum)
Transition of Care [facility]) - Progression Note    Patient Details  Name: [name] MRN: [id number] Date of Birth: Sep 01, 1965  [facility] CM/SW Contact  Tania Cuong Moorman, LCSW Phone Number: 09/02/2018, 8:59 AM  Clinical Narrative:    CSW received email that pt's care coordinator is working on a treatment plan. Pt's care coordinator will send pt's treatment plan to CSW for pt to sign and psychiatrist to sign.   11:37am - CSW faxed PCP back to pt's care coordinator, [name] [phone]), signed by pt and ED psychiatrist.       Expected Discharge Plan and Services                                                 Social Determinants of Health (SDOH) Interventions    Readmission Risk Interventions No flowsheet data found.

## 2018-09-02 NOTE — ED Notes (Signed)
Vol/Pending placement  

## 2018-09-03 DIAGNOSIS — F70 Mild intellectual disabilities: Secondary | ICD-10-CM | POA: Diagnosis not present

## 2018-09-03 DIAGNOSIS — F4325 Adjustment disorder with mixed disturbance of emotions and conduct: Secondary | ICD-10-CM | POA: Diagnosis not present

## 2018-09-03 DIAGNOSIS — F250 Schizoaffective disorder, bipolar type: Secondary | ICD-10-CM | POA: Diagnosis not present

## 2018-09-03 DIAGNOSIS — G809 Cerebral palsy, unspecified: Secondary | ICD-10-CM | POA: Diagnosis not present

## 2018-09-03 DIAGNOSIS — R4189 Other symptoms and signs involving cognitive functions and awareness: Secondary | ICD-10-CM | POA: Diagnosis not present

## 2018-09-03 LAB — URINALYSIS, COMPLETE (UACMP) WITH MICROSCOPIC
Bacteria, UA: NONE SEEN
Bilirubin Urine: NEGATIVE
Glucose, UA: NEGATIVE mg/dL
Hgb urine dipstick: NEGATIVE
Ketones, ur: NEGATIVE mg/dL
Nitrite: NEGATIVE
Protein, ur: 30 mg/dL — AB
Specific Gravity, Urine: 1.016 (ref 1.005–1.030)
WBC, UA: >50 WBC/hpf — ABNORMAL HIGH (ref 0–5)
pH: 7 (ref 5.0–8.0)

## 2018-09-03 MED ORDER — LORAZEPAM 2 MG PO TABS
2.0000 mg | ORAL_TABLET | Freq: Once | ORAL | Status: AC
  Administered 2018-09-03: 16:00:00 2 mg via ORAL
  Filled 2018-09-03: qty 1

## 2018-09-03 NOTE — ED Notes (Signed)
VOL  PENDING  PLACEMENT 

## 2018-09-03 NOTE — ED Notes (Signed)
BEHAVIORAL HEALTH ROUNDING Patient sleeping: No. Patient alert and oriented: yes Behavior appropriate: Yes.  ; If no, describe:  Nutrition and fluids offered: yes Toileting and hygiene offered: Yes  Sitter present: q15 minute observations and security monitoring Law enforcement present: Yes  ODS  Phone provided

## 2018-09-03 NOTE — ED Notes (Signed)
Report to include Situation, Background, Assessment, and Recommendations received from [facility] RN. Patient alert and oriented, warm and dry, in no acute distress. Patient denies HI, VH and pain. Patient states she has without plan and hears voices without command. Patient made aware of Q15 minute rounds and Psychologist, counselling presence for their safety. Patient instructed to come to me with needs or concerns.

## 2018-09-03 NOTE — ED Notes (Signed)
BEHAVIORAL HEALTH ROUNDING Patient sleeping: Yes.   Patient alert and oriented: eyes closed  Appears to be asleep Behavior appropriate: Yes.  ; If no, describe:  Nutrition and fluids offered: Yes  Toileting and hygiene offered: sleeping Sitter present: q 15 minute observations and security monitoring Law enforcement present: yes  ODS 

## 2018-09-03 NOTE — ED Notes (Signed)
Hourly rounding reveals patient sleeping in room. No complaints, stable, in no acute distress. Q15 minute rounds and monitoring via Rover and Officer to continue.  

## 2018-09-03 NOTE — ED Provider Notes (Signed)
-----------------------------------------   1:49 AM on 09/03/2018 -----------------------------------------   Blood pressure (!) 116/40, pulse 88, temperature 97.7 F (36.5 C), temperature source Oral, resp. rate 16, SpO2 95 %.  The patient is calm and resting.  Patient had some myalgias and dysuria, started on cephalexin by Dr. Juliette Alcide for suspicion of UTI.  Urinalysis reviewed, and sent for culture    Sharyn Creamer, MD 09/03/18 [phone]

## 2018-09-03 NOTE — ED Notes (Signed)
Patient observed lying in bed with eyes closed  Even, unlabored respirations observed   NAD pt appears to be sleeping  I will continue to monitor along with every 15 minute visual observations and ongoing security monitoring    

## 2018-09-03 NOTE — Progress Notes (Signed)
[facility]  09/03/2018 10:47 AM [name]  MRN:  [id number] Subjective:  " When I going to get up and go?" Principal Problem: Adjustment disorder with mixed disturbance of emotions and conduct Diagnosis: Principal Problem:   Adjustment disorder with mixed disturbance of emotions and conduct Active Problems:   Cerebral palsy (HCC)   Mild intellectual disability   GERD (gastroesophageal reflux disease)   COPD (chronic obstructive pulmonary disease) (HCC)   Schizoaffective disorder, bipolar type (HCC)   Suicidal ideation  Patient is seen, chart is reviewed.   Total Time spent with patient: 15 minutes   HPI:   On admission 08/08/18:  Sherri BradfordKimberly is a 53 year old Caucasian female with a history of schizoaffective disorder, intellectual disability and borderline personality disorder.  She was seen in emergency department due to suicidal ideations, but once evaluated, she denied having them, and therefore she was discharged. Later on the same day, she came back expressing suicidal thoughts again, and racing thoughts.    Past Psychiatric History: Schizoaffective disorder, mild intellectual disability, multiple suicide attempts.  On evaluation, patient has been awake and interactive.  Patient is denying complaints today.  She specifically denies SI, HI, AVH.  Patient is anxious and calls out from her room frequently to talk with someone.  She is perseverative on trying to find out exactly what day she will be able to leave the hospital and go to her new group home.  She is disappointed to know that there is not an exact dates that, but she is managing her frustration well.  Patient has had no behavioral issues.  Her overall mood is stated as good.   Historically, she is chronically suicidal at times especially upon arriving to the ED or when she does not want to be at her current living situation, group homes or her family  Frequently comes to the ED when she does not get what she wants.    Caveat:  Cognitive impairment with low level of frustration tolerance.   Awaiting for group home placement. Care coordinator has gotten approval for higher level of care.  She has had interviews and has been accepted at one facility, waiting to hear from the second.  Paperwork being completed.  Please see social work note.   Past Medical History:  Past Medical History:  Diagnosis Date  . Anxiety   . Asthma   . Borderline personality disorder (HCC)   . Cerebral palsy (HCC)   . COPD (chronic obstructive pulmonary disease) (HCC)   . Depression   . GERD (gastroesophageal reflux disease)   . Mild cognitive impairment   . Schizoaffective disorder (HCC)   . Wound abscess     Past Surgical History:  Procedure Laterality Date  . CHOLECYSTECTOMY    . INCISION AND DRAINAGE ABSCESS Right 01/02/2015   Procedure: INCISION AND DRAINAGE ABSCESS;  Surgeon: Tiney Rougealph Ely III, MD;  Location: [facility] ORS;  Service: General;  Laterality: Right;  . TONSILLECTOMY     Family History:  Family History  Problem Relation Age of Onset  . Heart attack Father   . Diabetes Mother    Family Psychiatric  History: None known  Social History:  Social History   Substance and Sexual Activity  Alcohol Use No     Social History   Substance and Sexual Activity  Drug Use No    Social History   Socioeconomic History  . Marital status: Single    Spouse name: Not on file  . Number of children: Not  on file  . Years of education: Not on file  . Highest education level: Not on file  Occupational History  . Occupation: Disabled  Social Needs  . Financial resource strain: Not on file  . Food insecurity:    Worry: Not on file    Inability: Not on file  . Transportation needs:    Medical: Not on file    Non-medical: Not on file  Tobacco Use  . Smoking status: Former Games developer  . Smokeless tobacco: Never Used  Substance and Sexual Activity  . Alcohol use: No  . Drug use: No  . Sexual activity: Not Currently   Lifestyle  . Physical activity:    Days per week: Not on file    Minutes per session: Not on file  . Stress: Not on file  Relationships  . Social connections:    Talks on phone: Not on file    Gets together: Not on file    Attends religious service: Not on file    Active member of club or organization: Not on file    Attends meetings of clubs or organizations: Not on file    Relationship status: Not on file  Other Topics Concern  . Not on file  Social History Narrative   Pt lives with mother, nephew, sister in law   Additional Social History:       Patient is essentially homeless at this time, as she previously was residing in a group home which had provided her 30-day notice. Her 30-day notice is completed.   Care coordinator has gotten approval for higher level of care.  Paperwork being completed for placement.   Sleep: Good  Appetite:  Good  Current Medications: Current Facility-Administered Medications  Medication Dose Route Frequency Provider Last Rate Last Dose  . acetaminophen (TYLENOL) tablet 650 mg  650 mg Oral Once Jeanmarie Plant, MD      . acetaminophen (TYLENOL) tablet 650 mg  650 mg Oral Q6H PRN Dionne Bucy, MD   650 mg at 09/03/18 0206  . acetaminophen (TYLENOL) tablet 650 mg  650 mg Oral Once Sharman Cheek, MD   Stopped at 08/23/18 1928  . benztropine (COGENTIN) tablet 2 mg  2 mg Oral BID Reggie Pile, MD   2 mg at 09/02/18 2106  . cephALEXin (KEFLEX) capsule 500 mg  500 mg Oral Q8H Arnaldo Natal, MD   500 mg at 09/03/18 0601  . clonazePAM (KLONOPIN) tablet 1 mg  1 mg Oral BID Reggie Pile, MD   1 mg at 09/02/18 2105  . diphenhydrAMINE (BENADRYL) capsule 50 mg  50 mg Oral Q8H PRN Mariel Craft, MD   50 mg at 09/01/18 1304   Or  . diphenhydrAMINE (BENADRYL) injection 50 mg  50 mg Intramuscular Q8H PRN Mariel Craft, MD   50 mg at 08/24/18 2357  . famotidine (PEPCID) tablet 20 mg  20 mg Oral BID Reggie Pile, MD   20 mg at 09/02/18 2107   . fenofibrate tablet 160 mg  160 mg Oral Daily Reggie Pile, MD   160 mg at 09/02/18 0904  . fluvoxaMINE (LUVOX) tablet 150 mg  150 mg Oral QHS Mariel Craft, MD   150 mg at 09/02/18 2104  . levothyroxine (SYNTHROID) tablet 50 mcg  50 mcg Oral Q0600 Reggie Pile, MD   50 mcg at 09/03/18 0601  . LORazepam (ATIVAN) tablet 1 mg  1 mg Oral BID PRN Mariel Craft, MD  Or  . LORazepam (ATIVAN) injection 2 mg  2 mg Intramuscular BID PRN Mariel Craft, MD      . nitrofurantoin (MACRODANTIN) capsule 100 mg  100 mg Oral Q2000 Mariel Craft, MD   100 mg at 09/02/18 2106  . nystatin (MYCOSTATIN/NYSTOP) topical powder   Topical BID Emily Filbert, MD      . paliperidone [facility] SUSTENNA) injection 156 mg  156 mg Intramuscular Q28 days Mariel Craft, MD   156 mg at 08/28/18 0509  . pantoprazole (PROTONIX) EC tablet 40 mg  40 mg Oral Daily Reggie Pile, MD   40 mg at 09/02/18 7989  . risperiDONE (RISPERDAL) tablet 1 mg  1 mg Oral BID WC Mariel Craft, MD   1 mg at 09/02/18 1800  . risperiDONE (RISPERDAL) tablet 2 mg  2 mg Oral QHS Mariel Craft, MD   2 mg at 09/02/18 2105  . sucralfate (CARAFATE) tablet 1 g  1 g Oral TID WC & HS Reggie Pile, MD   1 g at 09/02/18 2106   Current Outpatient Medications  Medication Sig Dispense Refill  . ARIPiprazole (ABILIFY) 20 MG tablet Take 1 tablet (20 mg total) by mouth daily. 30 tablet 0  . benztropine (COGENTIN) 2 MG tablet Take 1 tablet (2 mg total) by mouth 2 (two) times daily. 60 tablet 0  . clonazePAM (KLONOPIN) 1 MG tablet Take 1 tablet (1 mg total) by mouth 2 (two) times daily. 30 tablet 0  . divalproex (DEPAKOTE ER) 500 MG 24 hr tablet Take 2 tablets (1,000 mg total) by mouth at bedtime. 60 tablet 0  . famotidine (PEPCID) 20 MG tablet Take 1 tablet (20 mg total) by mouth 2 (two) times daily. 60 tablet 0  . fenofibrate 160 MG tablet Take 1 tablet (160 mg total) by mouth daily. 30 tablet 1  . fluvoxaMINE (LUVOX) 100 MG tablet Take 1  tablet (100 mg total) by mouth at bedtime. 30 tablet 0  . levothyroxine (SYNTHROID) 50 MCG tablet Take 1 tablet (50 mcg total) by mouth daily before breakfast. 30 tablet 1  . nitrofurantoin (MACRODANTIN) 100 MG capsule Take 1 capsule (100 mg total) by mouth 2 (two) times daily. 60 capsule 0  . OLANZapine (ZYPREXA) 5 MG tablet Take 1 tablet (5 mg total) by mouth every 8 (eight) hours as needed (agitation). 30 tablet 0  . pantoprazole (PROTONIX) 40 MG tablet Take 1 tablet (40 mg total) by mouth daily. 30 tablet 1  . sucralfate (CARAFATE) 1 g tablet Take 1 tablet (1 g total) by mouth 4 (four) times daily -  with meals and at bedtime. 120 tablet 1    Lab Results:  Results for orders placed or performed during the hospital encounter of 08/09/18 (from the past 48 hour(s))  Urinalysis, Complete w Microscopic     Status: Abnormal   Collection Time: 09/02/18  6:19 PM  Result Value Ref Range   Color, Urine YELLOW (A) YELLOW   APPearance TURBID (A) CLEAR   Specific Gravity, Urine 1.016 1.005 - 1.030   pH 7.0 5.0 - 8.0   Glucose, UA NEGATIVE NEGATIVE mg/dL   Hgb urine dipstick NEGATIVE NEGATIVE   Bilirubin Urine NEGATIVE NEGATIVE   Ketones, ur NEGATIVE NEGATIVE mg/dL   Protein, ur 30 (A) NEGATIVE mg/dL   Nitrite NEGATIVE NEGATIVE   Leukocytes,Ua LARGE (A) NEGATIVE   RBC / HPF 6-10 0 - 5 RBC/hpf   WBC, UA >50 (H) 0 - 5 WBC/hpf  Bacteria, UA NONE SEEN NONE SEEN   Squamous Epithelial / LPF 0-5 0 - 5   Amorphous Crystal PRESENT     Comment: Performed at [facility], [address] [place], [place] [address]  Comprehensive metabolic panel     Status: Abnormal   Collection Time: 09/02/18  8:57 PM  Result Value Ref Range   Sodium 141 135 - 145 mmol/L   Potassium 4.0 3.5 - 5.1 mmol/L   Chloride 107 98 - 111 mmol/L   CO2 25 22 - 32 mmol/L   Glucose, Bld 123 (H) 70 - 99 mg/dL   BUN 23 (H) 6 - 20 mg/dL   Creatinine, Ser 6.04 0.44 - 1.00 mg/dL   Calcium 9.0 8.9 - 54.0 mg/dL   Total  Protein 6.8 6.5 - 8.1 g/dL   Albumin 3.2 (L) 3.5 - 5.0 g/dL   AST 32 15 - 41 U/L   ALT 26 0 - 44 U/L   Alkaline Phosphatase 52 38 - 126 U/L   Total Bilirubin 0.2 (L) 0.3 - 1.2 mg/dL   GFR calc non Af Amer >60 >60 mL/min   GFR calc Af Amer >60 >60 mL/min   Anion gap 9 5 - 15    Comment: Performed at [facility], [address] [place], [place] [address]  Lactic acid, plasma     Status: None   Collection Time: 09/02/18  8:57 PM  Result Value Ref Range   Lactic Acid, Venous 1.6 0.5 - 1.9 mmol/L    Comment: Performed at [facility], [address] [place], [place] [address]  CBC with Differential     Status: Abnormal   Collection Time: 09/02/18  8:57 PM  Result Value Ref Range   WBC 6.2 4.0 - 10.5 K/uL   RBC 3.37 (L) 3.87 - 5.11 MIL/uL   Hemoglobin 11.0 (L) 12.0 - 15.0 g/dL   HCT 95.6 (L) 21.3 - 08.6 %   MCV 101.5 (H) 80.0 - 100.0 fL   MCH 32.6 26.0 - 34.0 pg   MCHC 32.2 30.0 - 36.0 g/dL   RDW 57.8 46.9 - 62.9 %   Platelets 347 150 - 400 K/uL   nRBC 0.0 0.0 - 0.2 %   Neutrophils Relative % 54 %   Neutro Abs 3.4 1.7 - 7.7 K/uL   Lymphocytes Relative 29 %   Lymphs Abs 1.8 0.7 - 4.0 K/uL   Monocytes Relative 12 %   Monocytes Absolute 0.7 0.1 - 1.0 K/uL   Eosinophils Relative 3 %   Eosinophils Absolute 0.2 0.0 - 0.5 K/uL   Basophils Relative 1 %   Basophils Absolute 0.1 0.0 - 0.1 K/uL   Immature Granulocytes 1 %   Abs Immature Granulocytes 0.04 0.00 - 0.07 K/uL    Comment: Performed at [facility], [address] [place], [place] [address]  CK     Status: Abnormal   Collection Time: 09/02/18  8:57 PM  Result Value Ref Range   Total CK 392 (H) 38 - 234 U/L    Comment: Performed at [facility], [address] [place], [place] [address]  Sedimentation rate     Status: Abnormal   Collection Time: 09/02/18  8:57 PM  Result Value Ref Range   Sed Rate 63 (H) 0 - 30 mm/hr    Comment: Performed at [facility], [address], [place], [place] [address]  TSH     Status: Abnormal   Collection Time: 09/02/18  8:57 PM  Result Value Ref Range   TSH 6.720 (H) 0.350 - 4.500 uIU/mL    Comment: Performed by a 3rd Generation assay with a functional sensitivity of <=0.01 uIU/mL. Performed at [facility], [address] [place], [place] [address]     Blood Alcohol level:  Lab Results  Component Value Date   [facility] <10 08/09/2018   ETH <10 08/01/2018    Metabolic Disorder Labs: Lab Results  Component Value Date   HGBA1C 4.8 08/11/2016   MPG 91 08/11/2016   Lab Results  Component Value Date   PROLACTIN 111.7 (H) 08/11/2016   PROLACTIN 66.8 (H) 06/17/2015   Lab Results  Component Value Date   CHOL 178 08/11/2016   TRIG 402 (H) 08/11/2016   HDL 13 (L) 08/11/2016   CHOLHDL 13.7 08/11/2016   VLDL UNABLE TO CALCULATE IF TRIGLYCERIDE OVER 400 mg/dL 60/45/4098   LDLCALC UNABLE TO CALCULATE IF TRIGLYCERIDE OVER 400 mg/dL 11/91/4782   LDLCALC 82 06/17/2015    Musculoskeletal: Strength & Muscle Tone: within normal limits Gait & Station: normal Patient leans: N/A  Psychiatric Specialty Exam: Physical Exam  Nursing note and vitals reviewed. Constitutional: She appears well-developed and well-nourished. No distress.  HENT:  Head: Normocephalic and atraumatic.  Eyes: EOM are normal.  Neck: Normal range of motion.  Cardiovascular: Normal rate and regular rhythm.  Respiratory: Effort normal. No respiratory distress.  Musculoskeletal: Normal range of motion.  Neurological: She is alert.  Psychiatric: Her behavior is normal. Her speech is clear and coherent with normal rate, tone, and volume. Cognition and memory are fair. She exhibits a euthymic, yet anxious mood. She denies suicidal ideation, and homicidal ideation. She expresses no suicidal or homicidal plans or intent   Review of Systems  Constitutional: Negative Respiratory: Negative.   Cardiovascular: Negative.   Gastrointestinal:  Negative.   Genitourinary: Positive for Incontinence /nocturia Musculoskeletal: Complains of muscle aches Neurological: Negative.   Psychiatric/Behavioral: Vague suicidal ideation, auditory and visual hallucinations. No HI. The patient is nervous at baseline. The patient does not have insomnia.     Blood pressure (!) 116/40, pulse 88, temperature 97.7 F (36.5 C), temperature source Oral, resp. rate 16, last menstrual period 05/28/2018, SpO2 95 %.There is no height or weight on file to calculate BMI.  General Appearance: Casual  Eye Contact:  Good  Speech:  Clear and Coherent  Volume:  Normal  Mood:  Anxious and Euthymic  Affect:  Appropriate  Thought Process:  Coherent  Orientation:  Full (Time, Place, and Person)  Thought Content:  Logical   Suicidal Thoughts:  No  Homicidal Thoughts:  Denies  Memory:  Fair  Judgement:  Fair  Insight:  Shallow  Psychomotor Activity:  Normal  Concentration:  Concentration: Fair  Recall:  Fair  Fund of Knowledge:  Fair  Language:  Good  Akathisia:  No  Handed:  Right  AIMS (if indicated):     Assets:  Financial Resources/Insurance  ADL's:  Impaired  Cognition:  Impaired,  Mild  Sleep:   Adequate    Treatment Plan Summary: Plan Of Care/Follow-up recommendations:  Daily contact with patient to assess and evaluate symptoms and progress in treatment and Medication management Adjustment disorder with mixed disturbance of emotions and conduct; Schizoaffective disorder, depressive disorder: Continue Risperdal 1 mg daily with morning and evening meals and 2 mg.  These can be changed to as needed doses after Invega reaches steady state via long-acting injectable formulation. Invega Sustenna 234 mg intramuscularly 08/24/2018  To  be followed by 156 mg on Aug 28, 2018 then dose to be determined by outpatient psychiatrist and provided to patient every 28 days. -Continue Luvox150 mg at bedtime for ruminations, anxiety and depressive  disorder  Agitation: Change to as needed Risperdal 1 mg every 8 hours as needed for agitation, in order to maintain single agent. -Continue Benadryl 50 mg every 8 hours PRN agitation  Anxiety: -Continue Klonopin 1 mg twice a day breakfast and dinner for anxiety Ativan 1 mg twice daily as needed for anxiety or sleep prior to patient's interview for group home placement.  Asthma: -Continue albuterol 2 puffs every hours  EPS prevention: -Continue Cogentin 2 mg twice daily for EPS prevention, and to maximize anticholinergic effect for assistance in decreasing urinary incontinence.  UTI prevention: -Continue Macrobid 100 mg daily -Monitor for further bladder symptoms.  Hypothyroid: -Continue Synthroid 50 mcg daily for hypothyroidism  GERD: -Continue Protonix 40 mg daily -Continue Pepcid 20 mg BID -Continue Carafate 1 gram with meals and at bedtime  Elevated triglycerides -Continue Fenofibrate 160 mg daily  Yeast infection prevention: Nystatin powder BID  We will encourage patient to makeurological/gynecology appointment after discharge.  Disposition: Continue working with her ACT team/Social work for placement in another group home setting, as patient is unlikely to be successful living independently. Patient is currently staying in emergency department to receive assistance from social work on housing.    New placement pending completion of paperwork.  Additional paperwork is signed and completed today and moving forward with processing for discharge to group home.  Mariel Craft, MD 09/03/2018, 10:47 AM

## 2018-09-03 NOTE — ED Notes (Signed)
BEHAVIORAL HEALTH ROUNDING Patient sleeping: No. Patient alert and oriented: yes Behavior appropriate: Yes.  ; If no, describe:  Nutrition and fluids offered: yes Toileting and hygiene offered: Yes  Sitter present: q15 minute observations and security  monitoring Law enforcement present: Yes  ODS  

## 2018-09-03 NOTE — ED Notes (Signed)
Gave food tray with juice. Pt is still sleeping does not want to eat at this time.

## 2018-09-03 NOTE — ED Notes (Signed)
Hourly rounding reveals patient in room. No complaints, stable, in no acute distress. Q15 minute rounds and monitoring via Rover and Officer to continue.   

## 2018-09-03 NOTE — ED Notes (Signed)

## 2018-09-03 NOTE — ED Notes (Signed)
Pt ambulated to bathroom to urinate

## 2018-09-03 NOTE — ED Notes (Signed)
Pt given meal tray.

## 2018-09-03 NOTE — ED Notes (Signed)
VOL/Pending Placement 

## 2018-09-03 NOTE — ED Notes (Signed)
ED  Is the patient under IVC or is there intent for IVC: voluntary  Is the patient medically cleared: Yes.   Is there vacancy in the ED BHU: Yes.   Unit closed Is the population mix appropriate for patient: Yes.   Is the patient awaiting placement in inpatient or outpatient setting: Yes. she is awaiting placement in a group home   Has the patient had a psychiatric consult: Yes.   Survey of unit performed for contraband, proper placement and condition of furniture, tampering with fixtures in bathroom, shower, and each patient room: Yes.  ; Findings:  APPEARANCE/BEHAVIOR Calm and cooperative NEURO ASSESSMENT Orientation: oriented x3  Denies pain Hallucinations: No.None noted (Hallucinations)  Denies  Speech: Normal Gait: normal RESPIRATORY ASSESSMENT Even  Unlabored respirations  CARDIOVASCULAR ASSESSMENT Pulses equal   regular rate  Skin warm and dry   GASTROINTESTINAL ASSESSMENT no GI complaint EXTREMITIES Full ROM  PLAN OF CARE Provide calm/safe environment. Vital signs assessed twice daily. ED BHU Assessment once each 12-hour shift. Collaborate with TTS daily or as condition indicates. Assure the ED provider has rounded once each shift. Provide and encourage hygiene. Provide redirection as needed. Assess for escalating behavior; address immediately and inform ED provider.  Assess family dynamic and appropriateness for visitation as needed: Yes.  ; If necessary, describe findings:  Educate the patient/family about BHU procedures/visitation: Yes.  ; If necessary, describe findings:

## 2018-09-03 NOTE — ED Notes (Signed)
She has ambulated to the BR - shower items have been provided to her

## 2018-09-03 NOTE — ED Notes (Signed)
Hourly rounding reveals patient in room. Stable, in no acute distress. Q15 minute rounds and monitoring via Rover and Officer to continue.  

## 2018-09-03 NOTE — ED Notes (Signed)
Pt incontinent of urine. Given a clean pair of underwear, pants and a pad to put on.

## 2018-09-03 NOTE — ED Notes (Signed)
Pt incontinent of urine. Given clean scrubs to change into.

## 2018-09-04 DIAGNOSIS — G809 Cerebral palsy, unspecified: Secondary | ICD-10-CM | POA: Diagnosis not present

## 2018-09-04 DIAGNOSIS — F250 Schizoaffective disorder, bipolar type: Secondary | ICD-10-CM | POA: Diagnosis not present

## 2018-09-04 DIAGNOSIS — F4325 Adjustment disorder with mixed disturbance of emotions and conduct: Secondary | ICD-10-CM | POA: Diagnosis not present

## 2018-09-04 DIAGNOSIS — F70 Mild intellectual disabilities: Secondary | ICD-10-CM | POA: Diagnosis not present

## 2018-09-04 DIAGNOSIS — R4189 Other symptoms and signs involving cognitive functions and awareness: Secondary | ICD-10-CM | POA: Diagnosis not present

## 2018-09-04 NOTE — ED Notes (Signed)
Patient is in the bathroom, no signs of distress.

## 2018-09-04 NOTE — ED Notes (Signed)
Hourly rounding reveals patient sleeping in hall bed. No complaints, stable, in no acute distress. Q15 minute rounds and monitoring via Rover and Officer to continue.  

## 2018-09-04 NOTE — ED Notes (Signed)
Hourly rounding reveals patient in room. Stable, in no acute distress. Q15 minute rounds and monitoring via Rover and Officer to continue.  

## 2018-09-04 NOTE — TOC Progression Note (Signed)
Transition of Care [facility]) - Progression Note    Patient Details  Name: [name] MRN: [id number] Date of Birth: 1965-07-12  [facility] CM/SW Contact  Tania Tavious Griesinger, LCSW Phone Number: 09/04/2018, 3:17 PM  Clinical Narrative:    CSW reached out to pt's care coordinator, Larena Sox, to see if there are any updates regarding group home placement. [name] stated that she has asked for a specific timeframe from her contracting department but has not heard back yet. [name] also shared that the group home has a couple more forms to submit to the [facility] department and after that they will create the contract. [name] stated that she will reach out to CSW  as soon as she hears back on a concrete timeframe.           Expected Discharge Plan and Services                                                 Social Determinants of Health (SDOH) Interventions    Readmission Risk Interventions No flowsheet data found.

## 2018-09-04 NOTE — ED Notes (Signed)
Hourly rounding reveals patient in hall bed. No complaints, stable, in no acute distress. Q15 minute rounds and monitoring via Rover and Officer to continue.  

## 2018-09-04 NOTE — ED Notes (Signed)
Pt ambulated to the restroom at this time with no assistance

## 2018-09-04 NOTE — ED Provider Notes (Signed)
-----------------------------------------   7:18 AM on 09/04/2018 -----------------------------------------   Blood pressure (!) 134/98, pulse 96, temperature 98.7 F (37.1 C), temperature source Oral, resp. rate 16, last menstrual period 05/28/2018, SpO2 96 %.  The patient is calm and cooperative at this time.  There have been no acute events since the last update.  Urine culture pending   Awaiting disposition plan from Behavioral Medicine team.    Jeanmarie Plant, MD 09/04/18 [phone]

## 2018-09-04 NOTE — Progress Notes (Signed)
[facility] MD Progress Note  09/04/2018 10:17 AM [name]  MRN:  [id number] Subjective:  " Why is it taking so long?" Principal Problem: Adjustment disorder with mixed disturbance of emotions and conduct Diagnosis: Principal Problem:   Adjustment disorder with mixed disturbance of emotions and conduct Active Problems:   Cerebral palsy (HCC)   Mild intellectual disability   GERD (gastroesophageal reflux disease)   COPD (chronic obstructive pulmonary disease) (HCC)   Schizoaffective disorder, bipolar type (HCC)   Suicidal ideation  Patient is seen, chart is reviewed.   Total Time spent with patient: 15 minutes   HPI:   On admission 08/08/18:  [name] is a 53 year old Caucasian female with a history of schizoaffective disorder, intellectual disability and borderline personality disorder.  She was seen in emergency department due to suicidal ideations, but once evaluated, she denied having them, and therefore she was discharged. Later on the same day, she came back expressing suicidal thoughts again, and racing thoughts.    Past Psychiatric History: Schizoaffective disorder, mild intellectual disability, multiple suicide attempts.  On evaluation, patient has been awake and interactive.  Patient is denying complaints today, other than still awaiting placement.  Patient presents with euthymic mood, and even jokes appropriately.  She is redirectable and has not exhibited any agitated behavior.  She specifically denies SI, HI, AVH.  Patient is anxious and calls out from her room frequently to talk with someone.  She is perseverative on trying to find out exactly what day she will be able to leave the hospital and go to her new group home.  She is informed that there is still paperwork pending approval from her care coordination team, and there is not a set date.  She is disappointed but she is managing her frustration well.  Patient has had no behavioral issues.  Her overall mood is stated as good.    Historically, she is chronically suicidal at times especially upon arriving to the ED or when she does not want to be at her current living situation, group homes or her family  Frequently comes to the ED when she does not get what she wants.   Caveat:  Cognitive impairment with low level of frustration tolerance.   Awaiting for group home placement. Care coordinator has gotten approval for higher level of care.  She has had interviews and has been accepted at one facility, waiting to hear from the second.  Paperwork being completed.  Please see social work note.   Past Medical History:  Past Medical History:  Diagnosis Date  . Anxiety   . Asthma   . Borderline personality disorder (HCC)   . Cerebral palsy (HCC)   . COPD (chronic obstructive pulmonary disease) (HCC)   . Depression   . GERD (gastroesophageal reflux disease)   . Mild cognitive impairment   . Schizoaffective disorder (HCC)   . Wound abscess     Past Surgical History:  Procedure Laterality Date  . CHOLECYSTECTOMY    . INCISION AND DRAINAGE ABSCESS Right 01/02/2015   Procedure: INCISION AND DRAINAGE ABSCESS;  Surgeon: Tiney Rouge III, MD;  Location: [facility] ORS;  Service: General;  Laterality: Right;  . TONSILLECTOMY     Family History:  Family History  Problem Relation Age of Onset  . Heart attack Father   . Diabetes Mother    Family Psychiatric  History: None known  Social History:  Social History   Substance and Sexual Activity  Alcohol Use No     Social History  Substance and Sexual Activity  Drug Use No    Social History   Socioeconomic History  . Marital status: Single    Spouse name: Not on file  . Number of children: Not on file  . Years of education: Not on file  . Highest education level: Not on file  Occupational History  . Occupation: Disabled  Social Needs  . Financial resource strain: Not on file  . Food insecurity:    Worry: Not on file    Inability: Not on file  . Transportation  needs:    Medical: Not on file    Non-medical: Not on file  Tobacco Use  . Smoking status: Former Games developermoker  . Smokeless tobacco: Never Used  Substance and Sexual Activity  . Alcohol use: No  . Drug use: No  . Sexual activity: Not Currently  Lifestyle  . Physical activity:    Days per week: Not on file    Minutes per session: Not on file  . Stress: Not on file  Relationships  . Social connections:    Talks on phone: Not on file    Gets together: Not on file    Attends religious service: Not on file    Active member of club or organization: Not on file    Attends meetings of clubs or organizations: Not on file    Relationship status: Not on file  Other Topics Concern  . Not on file  Social History Narrative   Pt lives with mother, nephew, sister in law   Additional Social History:       Patient is essentially homeless at this time, as she previously was residing in a group home which had provided her 30-day notice. Her 30-day notice is completed.   Care coordinator has gotten approval for higher level of care.  Paperwork being completed for placement.   Sleep: Good  Appetite:  Good  Current Medications: Current Facility-Administered Medications  Medication Dose Route Frequency Provider Last Rate Last Dose  . acetaminophen (TYLENOL) tablet 650 mg  650 mg Oral Once Jeanmarie PlantMcShane, James A, MD      . acetaminophen (TYLENOL) tablet 650 mg  650 mg Oral Q6H PRN Dionne BucySiadecki, Sebastian, MD   650 mg at 09/04/18 0258  . acetaminophen (TYLENOL) tablet 650 mg  650 mg Oral Once Sharman CheekStafford, Phillip, MD   Stopped at 08/23/18 1928  . benztropine (COGENTIN) tablet 2 mg  2 mg Oral BID Reggie PileJoshi, Anand, MD   2 mg at 09/03/18 2107  . cephALEXin (KEFLEX) capsule 500 mg  500 mg Oral Q8H Arnaldo NatalMalinda, Paul F, MD   500 mg at 09/03/18 2107  . clonazePAM (KLONOPIN) tablet 1 mg  1 mg Oral BID Reggie PileJoshi, Anand, MD   1 mg at 09/03/18 2107  . diphenhydrAMINE (BENADRYL) capsule 50 mg  50 mg Oral Q8H PRN Mariel CraftMaurer, Montie Swiderski M, MD    50 mg at 09/01/18 1304   Or  . diphenhydrAMINE (BENADRYL) injection 50 mg  50 mg Intramuscular Q8H PRN Mariel CraftMaurer, Neylan Koroma M, MD   50 mg at 08/24/18 2357  . famotidine (PEPCID) tablet 20 mg  20 mg Oral BID Reggie PileJoshi, Anand, MD   20 mg at 09/03/18 2107  . fenofibrate tablet 160 mg  160 mg Oral Daily Reggie PileJoshi, Anand, MD   160 mg at 09/03/18 1302  . fluvoxaMINE (LUVOX) tablet 150 mg  150 mg Oral QHS Mariel CraftMaurer, Math Brazie M, MD   150 mg at 09/03/18 2107  . levothyroxine (SYNTHROID) tablet 50 mcg  50  mcg Oral Q0600 Reggie Pile, MD   50 mcg at 09/03/18 0601  . LORazepam (ATIVAN) tablet 1 mg  1 mg Oral BID PRN Mariel Craft, MD       Or  . LORazepam (ATIVAN) injection 2 mg  2 mg Intramuscular BID PRN Mariel Craft, MD      . nitrofurantoin (MACRODANTIN) capsule 100 mg  100 mg Oral Q2000 Mariel Craft, MD   100 mg at 09/03/18 2107  . nystatin (MYCOSTATIN/NYSTOP) topical powder   Topical BID Emily Filbert, MD      . paliperidone [facility] SUSTENNA) injection 156 mg  156 mg Intramuscular Q28 days Mariel Craft, MD   156 mg at 08/28/18 0509  . pantoprazole (PROTONIX) EC tablet 40 mg  40 mg Oral Daily Reggie Pile, MD   40 mg at 09/03/18 1301  . risperiDONE (RISPERDAL) tablet 1 mg  1 mg Oral BID WC Mariel Craft, MD   1 mg at 09/03/18 1301  . risperiDONE (RISPERDAL) tablet 2 mg  2 mg Oral QHS Mariel Craft, MD   2 mg at 09/03/18 2107  . sucralfate (CARAFATE) tablet 1 g  1 g Oral TID WC & HS Reggie Pile, MD   1 g at 09/03/18 2107   Current Outpatient Medications  Medication Sig Dispense Refill  . ARIPiprazole (ABILIFY) 20 MG tablet Take 1 tablet (20 mg total) by mouth daily. 30 tablet 0  . benztropine (COGENTIN) 2 MG tablet Take 1 tablet (2 mg total) by mouth 2 (two) times daily. 60 tablet 0  . clonazePAM (KLONOPIN) 1 MG tablet Take 1 tablet (1 mg total) by mouth 2 (two) times daily. 30 tablet 0  . divalproex (DEPAKOTE ER) 500 MG 24 hr tablet Take 2 tablets (1,000 mg total) by mouth at bedtime. 60  tablet 0  . famotidine (PEPCID) 20 MG tablet Take 1 tablet (20 mg total) by mouth 2 (two) times daily. 60 tablet 0  . fenofibrate 160 MG tablet Take 1 tablet (160 mg total) by mouth daily. 30 tablet 1  . fluvoxaMINE (LUVOX) 100 MG tablet Take 1 tablet (100 mg total) by mouth at bedtime. 30 tablet 0  . levothyroxine (SYNTHROID) 50 MCG tablet Take 1 tablet (50 mcg total) by mouth daily before breakfast. 30 tablet 1  . nitrofurantoin (MACRODANTIN) 100 MG capsule Take 1 capsule (100 mg total) by mouth 2 (two) times daily. 60 capsule 0  . OLANZapine (ZYPREXA) 5 MG tablet Take 1 tablet (5 mg total) by mouth every 8 (eight) hours as needed (agitation). 30 tablet 0  . pantoprazole (PROTONIX) 40 MG tablet Take 1 tablet (40 mg total) by mouth daily. 30 tablet 1  . sucralfate (CARAFATE) 1 g tablet Take 1 tablet (1 g total) by mouth 4 (four) times daily -  with meals and at bedtime. 120 tablet 1    Lab Results:  Results for orders placed or performed during the hospital encounter of 08/09/18 (from the past 48 hour(s))  Urinalysis, Complete w Microscopic     Status: Abnormal   Collection Time: 09/02/18  6:19 PM  Result Value Ref Range   Color, Urine YELLOW (A) YELLOW   APPearance TURBID (A) CLEAR   Specific Gravity, Urine 1.016 1.005 - 1.030   pH 7.0 5.0 - 8.0   Glucose, UA NEGATIVE NEGATIVE mg/dL   Hgb urine dipstick NEGATIVE NEGATIVE   Bilirubin Urine NEGATIVE NEGATIVE   Ketones, ur NEGATIVE NEGATIVE mg/dL   Protein, ur 30 (  A) NEGATIVE mg/dL   Nitrite NEGATIVE NEGATIVE   Leukocytes,Ua LARGE (A) NEGATIVE   RBC / HPF 6-10 0 - 5 RBC/hpf   WBC, UA >50 (H) 0 - 5 WBC/hpf   Bacteria, UA NONE SEEN NONE SEEN   Squamous Epithelial / LPF 0-5 0 - 5   Amorphous Crystal PRESENT     Comment: Performed at [facility], [address] [place], [place] [address]  Comprehensive metabolic panel     Status: Abnormal   Collection Time: 09/02/18  8:57 PM  Result Value Ref Range   Sodium 141 135 - 145  mmol/L   Potassium 4.0 3.5 - 5.1 mmol/L   Chloride 107 98 - 111 mmol/L   CO2 25 22 - 32 mmol/L   Glucose, Bld 123 (H) 70 - 99 mg/dL   BUN 23 (H) 6 - 20 mg/dL   Creatinine, Ser 5.64 0.44 - 1.00 mg/dL   Calcium 9.0 8.9 - 33.2 mg/dL   Total Protein 6.8 6.5 - 8.1 g/dL   Albumin 3.2 (L) 3.5 - 5.0 g/dL   AST 32 15 - 41 U/L   ALT 26 0 - 44 U/L   Alkaline Phosphatase 52 38 - 126 U/L   Total Bilirubin 0.2 (L) 0.3 - 1.2 mg/dL   GFR calc non Af Amer >60 >60 mL/min   GFR calc Af Amer >60 >60 mL/min   Anion gap 9 5 - 15    Comment: Performed at [facility], [address] [place], [place] [address]  Lactic acid, plasma     Status: None   Collection Time: 09/02/18  8:57 PM  Result Value Ref Range   Lactic Acid, Venous 1.6 0.5 - 1.9 mmol/L    Comment: Performed at [facility], [address] [place], [place] [address]  CBC with Differential     Status: Abnormal   Collection Time: 09/02/18  8:57 PM  Result Value Ref Range   WBC 6.2 4.0 - 10.5 K/uL   RBC 3.37 (L) 3.87 - 5.11 MIL/uL   Hemoglobin 11.0 (L) 12.0 - 15.0 g/dL   HCT 63.0 (L) 16.0 - 10.9 %   MCV 101.5 (H) 80.0 - 100.0 fL   MCH 32.6 26.0 - 34.0 pg   MCHC 32.2 30.0 - 36.0 g/dL   RDW 32.3 55.7 - 32.2 %   Platelets 347 150 - 400 K/uL   nRBC 0.0 0.0 - 0.2 %   Neutrophils Relative % 54 %   Neutro Abs 3.4 1.7 - 7.7 K/uL   Lymphocytes Relative 29 %   Lymphs Abs 1.8 0.7 - 4.0 K/uL   Monocytes Relative 12 %   Monocytes Absolute 0.7 0.1 - 1.0 K/uL   Eosinophils Relative 3 %   Eosinophils Absolute 0.2 0.0 - 0.5 K/uL   Basophils Relative 1 %   Basophils Absolute 0.1 0.0 - 0.1 K/uL   Immature Granulocytes 1 %   Abs Immature Granulocytes 0.04 0.00 - 0.07 K/uL    Comment: Performed at [facility], [address] [place], [place] [address]  CK     Status: Abnormal   Collection Time: 09/02/18  8:57 PM  Result Value Ref Range   Total CK 392 (H) 38 - 234 U/L    Comment: Performed at [facility],  [address] [place], [place] [address]  Sedimentation rate     Status: Abnormal   Collection Time: 09/02/18  8:57 PM  Result Value Ref Range   Sed Rate  63 (H) 0 - 30 mm/hr    Comment: Performed at [facility], [address] [place], [place] [address]  TSH     Status: Abnormal   Collection Time: 09/02/18  8:57 PM  Result Value Ref Range   TSH 6.720 (H) 0.350 - 4.500 uIU/mL    Comment: Performed by a 3rd Generation assay with a functional sensitivity of <=0.01 uIU/mL. Performed at [facility], [address] [place], [place] [address]   Urine culture     Status: Abnormal (Preliminary result)   Collection Time: 09/03/18  1:33 AM  Result Value Ref Range   Specimen Description      URINE, RANDOM Performed at [facility], [address] [place], [place] [address]    Special Requests      NONE Performed at [facility], [address] [place], [place] [address]    Culture >=100,000 COLONIES/mL GRAM NEGATIVE RODS (A)    Report Status PENDING     Blood Alcohol level:  Lab Results  Component Value Date   [facility] <10 08/09/2018   ETH <10 08/01/2018    Metabolic Disorder Labs: Lab Results  Component Value Date   HGBA1C 4.8 08/11/2016   MPG 91 08/11/2016   Lab Results  Component Value Date   PROLACTIN 111.7 (H) 08/11/2016   PROLACTIN 66.8 (H) 06/17/2015   Lab Results  Component Value Date   CHOL 178 08/11/2016   TRIG 402 (H) 08/11/2016   HDL 13 (L) 08/11/2016   CHOLHDL 13.7 08/11/2016   VLDL UNABLE TO CALCULATE IF TRIGLYCERIDE OVER 400 mg/dL 29/56/2130   LDLCALC UNABLE TO CALCULATE IF TRIGLYCERIDE OVER 400 mg/dL 86/57/8469   LDLCALC 82 06/17/2015    Musculoskeletal: Strength & Muscle Tone: within normal limits Gait & Station: normal Patient leans: N/A  Psychiatric Specialty Exam: Physical Exam  Nursing note and vitals reviewed. Constitutional: She appears well-developed and well-nourished. No distress.  HENT:   Head: Normocephalic and atraumatic.  Eyes: EOM are normal.  Neck: Normal range of motion.  Cardiovascular: Normal rate and regular rhythm.  Respiratory: Effort normal. No respiratory distress.  Musculoskeletal: Normal range of motion.  Neurological: She is alert.  Psychiatric: Her behavior is normal. Her speech is clear and coherent with normal rate, tone, and volume. Cognition and memory are fair. She exhibits a euthymic, yet anxious mood. She denies suicidal ideation, and homicidal ideation. She expresses no suicidal or homicidal plans or intent   Review of Systems  Constitutional: Negative Respiratory: Negative.   Cardiovascular: Negative.   Gastrointestinal: Negative.   Genitourinary: Positive for Incontinence /nocturia Musculoskeletal: Complains of muscle aches Neurological: Negative.   Psychiatric/Behavioral: Vague suicidal ideation, auditory and visual hallucinations. No HI. The patient is nervous at baseline. The patient does not have insomnia.     Blood pressure (!) 134/98, pulse 96, temperature 98.7 F (37.1 C), temperature source Oral, resp. rate 16, last menstrual period 05/28/2018, SpO2 96 %.There is no height or weight on file to calculate BMI.  General Appearance: Casual  Eye Contact:  Good  Speech:  Clear and Coherent  Volume:  Normal  Mood:  Anxious and Euthymic  Affect:  Appropriate  Thought Process:  Coherent  Orientation:  Full (Time, Place, and Person)  Thought Content:  Logical   Suicidal Thoughts:  No  Homicidal Thoughts:  Denies  Memory:  Fair  Judgement:  Fair  Insight:  Shallow  Psychomotor Activity:  Normal  Concentration:  Concentration: Fair  Recall:  Fair  Fund of Knowledge:  Fair  Language:  Good  Akathisia:  No  Handed:  Right  AIMS (if indicated):     Assets:  Financial Resources/Insurance  ADL's:  Impaired  Cognition:  Impaired,  Mild  Sleep:   Adequate    Treatment Plan Summary: Plan Of Care/Follow-up recommendations:  Daily  contact with patient to assess and evaluate symptoms and progress in treatment and Medication management Adjustment disorder with mixed disturbance of emotions and conduct; Schizoaffective disorder, depressive disorder: Continue Risperdal 1 mg daily with morning and evening meals and 2 mg.  These can be changed to as needed doses after Invega reaches steady state via long-acting injectable formulation. Hinda Glatter Sustenna 234 mg intramuscularly 08/24/2018  To be followed by 156 mg on Aug 28, 2018 then dose to be determined by outpatient psychiatrist and provided to patient every 28 days. -Continue Luvox150 mg at bedtime for ruminations, anxiety and depressive disorder  Agitation: Change to as needed Risperdal 1 mg every 8 hours as needed for agitation, in order to maintain single agent. -Continue Benadryl 50 mg every 8 hours PRN agitation  Anxiety: -Continue Klonopin 1 mg twice a day breakfast and dinner for anxiety Ativan 1 mg twice daily as needed for anxiety or sleep prior to patient's interview for group home placement.  Asthma: -Continue albuterol 2 puffs every hours  EPS prevention: -Continue Cogentin 2 mg twice daily for EPS prevention, and to maximize anticholinergic effect for assistance in decreasing urinary incontinence.  UTI prevention: -Continue Macrobid 100 mg daily -Monitor for further bladder symptoms.  Hypothyroid: -Continue Synthroid 50 mcg daily for hypothyroidism  GERD: -Continue Protonix 40 mg daily -Continue Pepcid 20 mg BID -Continue Carafate 1 gram with meals and at bedtime  Elevated triglycerides -Continue Fenofibrate 160 mg daily  Yeast infection prevention: Nystatin powder BID  We will encourage patient to makeurological/gynecology appointment after discharge.  Disposition: Continue working with her ACT team/Social work for placement in another group home setting, as patient is unlikely to be successful living independently. Patient is  currently staying in emergency department to receive assistance from social work on housing.    New placement pending completion of paperwork.  Additional paperwork is signed and completed today and moving forward with processing for discharge to group home.  Mariel Craft, MD 09/04/2018, 10:17 AM

## 2018-09-04 NOTE — ED Notes (Signed)
Pt in restroom asking for clean clothes. Clean clothes provided for her. Pt changing in restroom at this time with no assistance.

## 2018-09-04 NOTE — ED Notes (Signed)
Patient took po pills and ask for [place] sandwich, nurse obtained for her, she is calm and cooperative.

## 2018-09-04 NOTE — ED Notes (Signed)
Pt complaining of neck pain and left knee pain.  Offered pt Tylenol.  Pt refused.

## 2018-09-05 DIAGNOSIS — G809 Cerebral palsy, unspecified: Secondary | ICD-10-CM | POA: Diagnosis not present

## 2018-09-05 DIAGNOSIS — R4189 Other symptoms and signs involving cognitive functions and awareness: Secondary | ICD-10-CM | POA: Diagnosis not present

## 2018-09-05 DIAGNOSIS — F70 Mild intellectual disabilities: Secondary | ICD-10-CM | POA: Diagnosis not present

## 2018-09-05 DIAGNOSIS — F4325 Adjustment disorder with mixed disturbance of emotions and conduct: Secondary | ICD-10-CM | POA: Diagnosis not present

## 2018-09-05 DIAGNOSIS — F250 Schizoaffective disorder, bipolar type: Secondary | ICD-10-CM | POA: Diagnosis not present

## 2018-09-05 LAB — URINE CULTURE: Culture: >=100000 — AB

## 2018-09-05 MED ORDER — IBUPROFEN 600 MG PO TABS
600.0000 mg | ORAL_TABLET | Freq: Once | ORAL | Status: AC
  Administered 2018-09-05: 600 mg via ORAL
  Filled 2018-09-05: qty 1

## 2018-09-05 NOTE — ED Provider Notes (Signed)
-----------------------------------------   6:31 AM on 09/05/2018 -----------------------------------------   Blood pressure 106/79, pulse 99, temperature 98.7 F (37.1 C), temperature source Oral, resp. rate 16, last menstrual period 05/28/2018, SpO2 100 %.  The patient is calm and cooperative at this time.  Motrin was given for musculoskeletal neck pain.  Awaiting disposition plan from Behavioral Medicine team.   Irean Hong, MD 09/05/18 [phone]

## 2018-09-05 NOTE — ED Notes (Signed)
Writer informed Dr. Darnelle Catalan of patients complaint of back pain. MD ordered Tylenol and he is notified of patient wanting a Xray on neck and back

## 2018-09-05 NOTE — ED Notes (Signed)
Care coordinator [name]) called to speak with patient earlier this morning. Pt on phone with [name].

## 2018-09-05 NOTE — ED Notes (Signed)
Patient urinated in the bed, writer cleaned the bed and gave patient items to clean herself up in the bathroom. Patient in the bathroom

## 2018-09-05 NOTE — TOC Progression Note (Signed)
Transition of Care [facility]) - Progression Note    Patient Details  Name: [name] MRN: [id number] Date of Birth: 1965/05/03  [facility]) CM/SW Contact  Tania Niasha Devins, LCSW Phone Number: 09/05/2018, 2:38 PM  Clinical Narrative:    Pt's care coordinator, Larena Sox contacted CSW to provide an update. [name] shared all the documentation has been turned in with the exception of a couple of things that [facility] group home has to give directly to the [facility] department. She then believes the contract will be created, however she does not have a timeline on when that will be. [name] stated that she will reach out to CSW when she has a timeline.         Expected Discharge Plan and Services                                                 Social Determinants of Health (SDOH) Interventions    Readmission Risk Interventions No flowsheet data found.

## 2018-09-05 NOTE — Progress Notes (Signed)
[facility] MD Progress Note  09/05/2018 9:31 AM [name]  MRN:  [id number] Subjective:  " I talked to [name], she said there are 3 more papers that need to be approved." Principal Problem: Adjustment disorder with mixed disturbance of emotions and conduct Diagnosis: Principal Problem:   Adjustment disorder with mixed disturbance of emotions and conduct Active Problems:   Cerebral palsy (HCC)   Mild intellectual disability   GERD (gastroesophageal reflux disease)   COPD (chronic obstructive pulmonary disease) (HCC)   Schizoaffective disorder, bipolar type (HCC)   Suicidal ideation  Patient is seen, chart is reviewed.   Total Time spent with patient: 15 minutes   HPI:   On admission 08/08/18:  [name] is a 53 year old Caucasian female with a history of schizoaffective disorder, intellectual disability and borderline personality disorder.  She was seen in emergency department due to suicidal ideations, but once evaluated, she denied having them, and therefore she was discharged. Later on the same day, she came back expressing suicidal thoughts again, and racing thoughts.    Past Psychiatric History: Schizoaffective disorder, mild intellectual disability, multiple suicide attempts.  On evaluation, patient has been awake and interactive.  Patient reports that she has difficulty cleaning herself, because she was never taught to as a child.  Patient responds to redirection and instructions on how to complete her ADLs.  Discussed with patient that she needs to be independent as much as able in order to live in the group home.  Patient states that she is willing to learn.  She denies other complaints.  She specifically denies SI, HI, AVH.  Patient has had no behavioral issues.  Her overall mood is stated as good, and her affect is congruent and pleasant.  Historically, she is chronically suicidal at times especially upon arriving to the ED or when she does not want to be at her current living  situation, group homes or her family  Frequently comes to the ED when she does not get what she wants.   Caveat:  Cognitive impairment with low level of frustration tolerance.   Awaiting for group home placement. Care coordinator has gotten approval for higher level of care.  She has had interviews and has been accepted at one facility, waiting to hear from the second.  Paperwork being completed.  Please see social work note.   Past Medical History:  Past Medical History:  Diagnosis Date  . Anxiety   . Asthma   . Borderline personality disorder (HCC)   . Cerebral palsy (HCC)   . COPD (chronic obstructive pulmonary disease) (HCC)   . Depression   . GERD (gastroesophageal reflux disease)   . Mild cognitive impairment   . Schizoaffective disorder (HCC)   . Wound abscess     Past Surgical History:  Procedure Laterality Date  . CHOLECYSTECTOMY    . INCISION AND DRAINAGE ABSCESS Right 01/02/2015   Procedure: INCISION AND DRAINAGE ABSCESS;  Surgeon: Tiney Rouge III, MD;  Location: [facility] ORS;  Service: General;  Laterality: Right;  . TONSILLECTOMY     Family History:  Family History  Problem Relation Age of Onset  . Heart attack Father   . Diabetes Mother    Family Psychiatric  History: None known  Social History:  Social History   Substance and Sexual Activity  Alcohol Use No     Social History   Substance and Sexual Activity  Drug Use No    Social History   Socioeconomic History  . Marital status: Single  Spouse name: Not on file  . Number of children: Not on file  . Years of education: Not on file  . Highest education level: Not on file  Occupational History  . Occupation: Disabled  Social Needs  . Financial resource strain: Not on file  . Food insecurity:    Worry: Not on file    Inability: Not on file  . Transportation needs:    Medical: Not on file    Non-medical: Not on file  Tobacco Use  . Smoking status: Former Games developer  . Smokeless tobacco: Never Used   Substance and Sexual Activity  . Alcohol use: No  . Drug use: No  . Sexual activity: Not Currently  Lifestyle  . Physical activity:    Days per week: Not on file    Minutes per session: Not on file  . Stress: Not on file  Relationships  . Social connections:    Talks on phone: Not on file    Gets together: Not on file    Attends religious service: Not on file    Active member of club or organization: Not on file    Attends meetings of clubs or organizations: Not on file    Relationship status: Not on file  Other Topics Concern  . Not on file  Social History Narrative   Pt lives with mother, nephew, sister in law   Additional Social History:       Patient is essentially homeless at this time, as she previously was residing in a group home which had provided her 30-day notice. Her 30-day notice is completed.   Care coordinator has gotten approval for higher level of care.  Paperwork being completed for placement.   Sleep: Good  Appetite:  Good  Current Medications: Current Facility-Administered Medications  Medication Dose Route Frequency Provider Last Rate Last Dose  . acetaminophen (TYLENOL) tablet 650 mg  650 mg Oral Once Jeanmarie Plant, MD      . acetaminophen (TYLENOL) tablet 650 mg  650 mg Oral Q6H PRN Dionne Bucy, MD   650 mg at 09/04/18 0258  . acetaminophen (TYLENOL) tablet 650 mg  650 mg Oral Once Sharman Cheek, MD   Stopped at 08/23/18 1928  . benztropine (COGENTIN) tablet 2 mg  2 mg Oral BID Reggie Pile, MD   2 mg at 09/04/18 2129  . cephALEXin (KEFLEX) capsule 500 mg  500 mg Oral Q8H Arnaldo Natal, MD   500 mg at 09/05/18 7124  . clonazePAM (KLONOPIN) tablet 1 mg  1 mg Oral BID Reggie Pile, MD   1 mg at 09/04/18 2129  . diphenhydrAMINE (BENADRYL) capsule 50 mg  50 mg Oral Q8H PRN Mariel Craft, MD   50 mg at 09/01/18 1304   Or  . diphenhydrAMINE (BENADRYL) injection 50 mg  50 mg Intramuscular Q8H PRN Mariel Craft, MD   50 mg at  08/24/18 2357  . famotidine (PEPCID) tablet 20 mg  20 mg Oral BID Reggie Pile, MD   20 mg at 09/04/18 2123  . fenofibrate tablet 160 mg  160 mg Oral Daily Reggie Pile, MD   160 mg at 09/04/18 1221  . fluvoxaMINE (LUVOX) tablet 150 mg  150 mg Oral QHS Mariel Craft, MD   150 mg at 09/04/18 2124  . levothyroxine (SYNTHROID) tablet 50 mcg  50 mcg Oral Q0600 Reggie Pile, MD   50 mcg at 09/05/18 [phone]  . LORazepam (ATIVAN) tablet 1 mg  1 mg Oral BID  PRN Mariel Craft, MD       Or  . LORazepam (ATIVAN) injection 2 mg  2 mg Intramuscular BID PRN Mariel Craft, MD      . nitrofurantoin (MACRODANTIN) capsule 100 mg  100 mg Oral Q2000 Mariel Craft, MD   100 mg at 09/04/18 1947  . nystatin (MYCOSTATIN/NYSTOP) topical powder   Topical BID Emily Filbert, MD      . paliperidone [facility] SUSTENNA) injection 156 mg  156 mg Intramuscular Q28 days Mariel Craft, MD   156 mg at 08/28/18 0509  . pantoprazole (PROTONIX) EC tablet 40 mg  40 mg Oral Daily Reggie Pile, MD   40 mg at 09/04/18 1220  . risperiDONE (RISPERDAL) tablet 1 mg  1 mg Oral BID WC Mariel Craft, MD   1 mg at 09/04/18 1618  . risperiDONE (RISPERDAL) tablet 2 mg  2 mg Oral QHS Mariel Craft, MD   2 mg at 09/04/18 2123  . sucralfate (CARAFATE) tablet 1 g  1 g Oral TID WC & HS Reggie Pile, MD   1 g at 09/04/18 2123   Current Outpatient Medications  Medication Sig Dispense Refill  . ARIPiprazole (ABILIFY) 20 MG tablet Take 1 tablet (20 mg total) by mouth daily. 30 tablet 0  . benztropine (COGENTIN) 2 MG tablet Take 1 tablet (2 mg total) by mouth 2 (two) times daily. 60 tablet 0  . clonazePAM (KLONOPIN) 1 MG tablet Take 1 tablet (1 mg total) by mouth 2 (two) times daily. 30 tablet 0  . divalproex (DEPAKOTE ER) 500 MG 24 hr tablet Take 2 tablets (1,000 mg total) by mouth at bedtime. 60 tablet 0  . famotidine (PEPCID) 20 MG tablet Take 1 tablet (20 mg total) by mouth 2 (two) times daily. 60 tablet 0  . fenofibrate 160 MG  tablet Take 1 tablet (160 mg total) by mouth daily. 30 tablet 1  . fluvoxaMINE (LUVOX) 100 MG tablet Take 1 tablet (100 mg total) by mouth at bedtime. 30 tablet 0  . levothyroxine (SYNTHROID) 50 MCG tablet Take 1 tablet (50 mcg total) by mouth daily before breakfast. 30 tablet 1  . nitrofurantoin (MACRODANTIN) 100 MG capsule Take 1 capsule (100 mg total) by mouth 2 (two) times daily. 60 capsule 0  . OLANZapine (ZYPREXA) 5 MG tablet Take 1 tablet (5 mg total) by mouth every 8 (eight) hours as needed (agitation). 30 tablet 0  . pantoprazole (PROTONIX) 40 MG tablet Take 1 tablet (40 mg total) by mouth daily. 30 tablet 1  . sucralfate (CARAFATE) 1 g tablet Take 1 tablet (1 g total) by mouth 4 (four) times daily -  with meals and at bedtime. 120 tablet 1    Lab Results:  No results found for this or any previous visit (from the past 48 hour(s)).  Blood Alcohol level:  Lab Results  Component Value Date   ETH <10 08/09/2018   ETH <10 08/01/2018    Metabolic Disorder Labs: Lab Results  Component Value Date   HGBA1C 4.8 08/11/2016   MPG 91 08/11/2016   Lab Results  Component Value Date   PROLACTIN 111.7 (H) 08/11/2016   PROLACTIN 66.8 (H) 06/17/2015   Lab Results  Component Value Date   CHOL 178 08/11/2016   TRIG 402 (H) 08/11/2016   HDL 13 (L) 08/11/2016   CHOLHDL 13.7 08/11/2016   VLDL UNABLE TO CALCULATE IF TRIGLYCERIDE OVER 400 mg/dL 40/98/1191   LDLCALC UNABLE TO CALCULATE IF TRIGLYCERIDE  OVER 400 mg/dL 16/01/9603   LDLCALC 82 06/17/2015    Musculoskeletal: Strength & Muscle Tone: within normal limits Gait & Station: normal Patient leans: N/A  Psychiatric Specialty Exam: Physical Exam  Nursing note and vitals reviewed. Constitutional: She appears well-developed and well-nourished. No distress.  HENT:  Head: Normocephalic and atraumatic.  Eyes: EOM are normal.  Neck: Normal range of motion.  Cardiovascular: Normal rate and regular rhythm.  Respiratory: Effort normal.  No respiratory distress.  Musculoskeletal: Normal range of motion.  Neurological: She is alert.  Psychiatric: Her behavior is normal. Her speech is clear and coherent with normal rate, tone, and volume. Cognition and memory are fair. She exhibits a euthymic mood. She denies suicidal ideation, and homicidal ideation. She expresses no suicidal or homicidal plans or intent   Review of Systems  Constitutional: Negative Respiratory: Negative.   Cardiovascular: Negative.   Gastrointestinal: Negative.   Genitourinary: Positive for Incontinence /nocturia Musculoskeletal: Complains of muscle aches Neurological: Negative.   Psychiatric/Behavioral: Vague suicidal ideation, auditory and visual hallucinations. No HI. The patient is nervous at baseline. The patient does not have insomnia.     Blood pressure 106/79, pulse 99, temperature 98.7 F (37.1 C), temperature source Oral, resp. rate 16, last menstrual period 05/28/2018, SpO2 100 %.There is no height or weight on file to calculate BMI.  General Appearance: Casual  Eye Contact:  Good  Speech:  Clear and Coherent  Volume:  Normal  Mood:  Euthymic  Affect:  Appropriate  Thought Process:  Coherent  Orientation:  Full (Time, Place, and Person)  Thought Content:  Logical   Suicidal Thoughts:  No  Homicidal Thoughts:  Denies  Memory:  Fair  Judgement:  Fair  Insight:  Fair  Psychomotor Activity:  Normal  Concentration:  Concentration: Fair  Recall:  Fair  Fund of Knowledge:  Fair  Language:  Good  Akathisia:  No  Handed:  Right  AIMS (if indicated):     Assets:  Financial Resources/Insurance  ADL's:  Impaired  Cognition:  Impaired,  Mild  Sleep:   Adequate    Treatment Plan Summary: Plan Of Care/Follow-up recommendations:  Daily contact with patient to assess and evaluate symptoms and progress in treatment and Medication management Adjustment disorder with mixed disturbance of emotions and conduct; Schizoaffective disorder, depressive  disorder: Continue Risperdal 1 mg daily with morning and evening meals and 2 mg.  These can be changed to as needed doses after Invega reaches steady state via long-acting injectable formulation. Hinda Glatter Sustenna 234 mg intramuscularly 08/24/2018  To be followed by 156 mg on Aug 28, 2018 then dose to be determined by outpatient psychiatrist and provided to patient every 28 days. -Continue Luvox150 mg at bedtime for ruminations, anxiety and depressive disorder  Agitation: Change to as needed Risperdal 1 mg every 8 hours as needed for agitation, in order to maintain single agent. -Continue Benadryl 50 mg every 8 hours PRN agitation  Anxiety: -Continue Klonopin 1 mg twice a day breakfast and dinner for anxiety Ativan 1 mg twice daily as needed for anxiety or sleep prior to patient's interview for group home placement.  Asthma: -Continue albuterol 2 puffs every hours  EPS prevention: -Continue Cogentin 2 mg twice daily for EPS prevention, and to maximize anticholinergic effect for assistance in decreasing urinary incontinence.  UTI prevention: -Continue Macrobid 100 mg daily -Monitor for further bladder symptoms.  Hypothyroid: -Continue Synthroid 50 mcg daily for hypothyroidism  GERD: -Continue Protonix 40 mg daily -Continue Pepcid 20 mg BID -Continue  Carafate 1 gram with meals and at bedtime  Elevated triglycerides -Continue Fenofibrate 160 mg daily  Yeast infection prevention: Nystatin powder BID  We will encourage patient to makeurological/gynecology appointment after discharge.  Disposition: Continue working with her ACT team/Social work for placement in another group home setting, as patient is unlikely to be successful living independently. Patient is currently staying in emergency department to receive assistance from social work on housing.    New placement pending completion of paperwork.   Mariel CraftSHEILA M MAURER, MD 09/05/2018, 9:31 AM

## 2018-09-05 NOTE — ED Notes (Signed)
Patient c/o neck pain. Dr. Dolores Frame aware and to see patient.

## 2018-09-06 DIAGNOSIS — F4325 Adjustment disorder with mixed disturbance of emotions and conduct: Secondary | ICD-10-CM | POA: Diagnosis not present

## 2018-09-06 DIAGNOSIS — R4189 Other symptoms and signs involving cognitive functions and awareness: Secondary | ICD-10-CM | POA: Diagnosis not present

## 2018-09-06 NOTE — ED Notes (Signed)
Patient had episode of urinary incontinence. Patient changed into clean clothes and undergarments. incontinence pads changed on bed.

## 2018-09-06 NOTE — ED Notes (Signed)
Patient given a chocolate milk

## 2018-09-06 NOTE — ED Notes (Signed)
Pt. Currently sleeping in room on bed.

## 2018-09-06 NOTE — ED Notes (Signed)
Patient in the bathroom, patient had an accident where she urinated on the floor in her room, writer changed her bed chucks, nurse gave her some clean clothing and EVS mopped her room

## 2018-09-06 NOTE — ED Notes (Signed)
VOL/Pending Placement 

## 2018-09-06 NOTE — Consult Note (Signed)
[facility] Psych ED follow-up  09/06/2018 2:22 PM [name]  MRN:  [id number] Principal Problem: Adjustment disorder with mixed disturbance of emotions and conduct Discharge Diagnoses: Principal Problem:   Adjustment disorder with mixed disturbance of emotions and conduct Active Problems:   Schizoaffective disorder, bipolar type (HCC)   Cerebral palsy (HCC)   Mild intellectual disability   GERD (gastroesophageal reflux disease)   COPD (chronic obstructive pulmonary disease) (HCC)   Suicidal ideation  Subjective:  "I'm trying to find out when I'm leaving" as she was trying to get in touch with [name], her care coordinator.  Today, patient was seen and chart reviewed.  Patient is cooperative, anxious at times regarding when she will be leaving.  Denies any pain.  She has no suicidal/homicidal ideations, hallucinations, or substance abuse issues.  Ms. Ploch is frustrated at times as she does not want to do her ADLs but is very capable of doing these, encouragement provided.  Patient has been here awaiting placement into a group home and frustrated at times with waiting.  Total Time spent with patient: 15 minutes  Past Psychiatric History: schizoaffective disorder, intellectual disability  Past Medical History:  Past Medical History:  Diagnosis Date  . Anxiety   . Asthma   . Borderline personality disorder (HCC)   . Cerebral palsy (HCC)   . COPD (chronic obstructive pulmonary disease) (HCC)   . Depression   . GERD (gastroesophageal reflux disease)   . Mild cognitive impairment   . Schizoaffective disorder (HCC)   . Wound abscess     Past Surgical History:  Procedure Laterality Date  . CHOLECYSTECTOMY    . INCISION AND DRAINAGE ABSCESS Right 01/02/2015   Procedure: INCISION AND DRAINAGE ABSCESS;  Surgeon: Tiney Rouge III, MD;  Location: [facility] ORS;  Service: General;  Laterality: Right;  . TONSILLECTOMY     Family History:  Family History  Problem Relation Age of Onset  . Heart  attack Father   . Diabetes Mother    Family Psychiatric  History: none  Social History:  Patient is essentially homeless at this time, as she previously was residing in a group home which had provided her 30-day notice.  Her 30-day notice is completed.  Social History   Substance and Sexual Activity  Alcohol Use No     Social History   Substance and Sexual Activity  Drug Use No    Social History   Socioeconomic History  . Marital status: Single    Spouse name: Not on file  . Number of children: Not on file  . Years of education: Not on file  . Highest education level: Not on file  Occupational History  . Occupation: Disabled  Social Needs  . Financial resource strain: Not on file  . Food insecurity:    Worry: Not on file    Inability: Not on file  . Transportation needs:    Medical: Not on file    Non-medical: Not on file  Tobacco Use  . Smoking status: Former Games developer  . Smokeless tobacco: Never Used  Substance and Sexual Activity  . Alcohol use: No  . Drug use: No  . Sexual activity: Not Currently  Lifestyle  . Physical activity:    Days per week: Not on file    Minutes per session: Not on file  . Stress: Not on file  Relationships  . Social connections:    Talks on phone: Not on file    Gets together: Not on file  Attends religious service: Not on file    Active member of club or organization: Not on file    Attends meetings of clubs or organizations: Not on file    Relationship status: Not on file  Other Topics Concern  . Not on file  Social History Narrative   Pt lives with mother, nephew, sister in law    Has this patient used any form of tobacco in the last 30 days? (Cigarettes, Smokeless Tobacco, Cigars, and/or Pipes) NA  Current Medications: Current Facility-Administered Medications  Medication Dose Route Frequency Provider Last Rate Last Dose  . acetaminophen (TYLENOL) tablet 650 mg  650 mg Oral Once Jeanmarie Plant, MD      .  acetaminophen (TYLENOL) tablet 650 mg  650 mg Oral Q6H PRN Dionne Bucy, MD   650 mg at 09/06/18 1032  . acetaminophen (TYLENOL) tablet 650 mg  650 mg Oral Once Sharman Cheek, MD   Stopped at 08/23/18 1928  . benztropine (COGENTIN) tablet 2 mg  2 mg Oral BID Reggie Pile, MD   2 mg at 09/06/18 1030  . cephALEXin (KEFLEX) capsule 500 mg  500 mg Oral Q8H Arnaldo Natal, MD   500 mg at 09/06/18 1306  . clonazePAM (KLONOPIN) tablet 1 mg  1 mg Oral BID Reggie Pile, MD   1 mg at 09/06/18 1031  . diphenhydrAMINE (BENADRYL) capsule 50 mg  50 mg Oral Q8H PRN Mariel Craft, MD   50 mg at 09/01/18 1304   Or  . diphenhydrAMINE (BENADRYL) injection 50 mg  50 mg Intramuscular Q8H PRN Mariel Craft, MD   50 mg at 08/24/18 2357  . famotidine (PEPCID) tablet 20 mg  20 mg Oral BID Reggie Pile, MD   20 mg at 09/06/18 1030  . fenofibrate tablet 160 mg  160 mg Oral Daily Reggie Pile, MD   160 mg at 09/06/18 1030  . fluvoxaMINE (LUVOX) tablet 150 mg  150 mg Oral QHS Mariel Craft, MD   150 mg at 09/05/18 2100  . levothyroxine (SYNTHROID) tablet 50 mcg  50 mcg Oral Q0600 Reggie Pile, MD   50 mcg at 09/06/18 1610  . LORazepam (ATIVAN) tablet 1 mg  1 mg Oral BID PRN Mariel Craft, MD       Or  . LORazepam (ATIVAN) injection 2 mg  2 mg Intramuscular BID PRN Mariel Craft, MD      . nitrofurantoin (MACRODANTIN) capsule 100 mg  100 mg Oral Q2000 Mariel Craft, MD   100 mg at 09/05/18 2100  . nystatin (MYCOSTATIN/NYSTOP) topical powder   Topical BID Emily Filbert, MD      . paliperidone [facility] SUSTENNA) injection 156 mg  156 mg Intramuscular Q28 days Mariel Craft, MD   156 mg at 08/28/18 0509  . pantoprazole (PROTONIX) EC tablet 40 mg  40 mg Oral Daily Reggie Pile, MD   40 mg at 09/06/18 1030  . risperiDONE (RISPERDAL) tablet 1 mg  1 mg Oral BID WC Mariel Craft, MD   1 mg at 09/06/18 0745  . risperiDONE (RISPERDAL) tablet 2 mg  2 mg Oral QHS Mariel Craft, MD   2 mg at  09/05/18 2101  . sucralfate (CARAFATE) tablet 1 g  1 g Oral TID WC & HS Reggie Pile, MD   1 g at 09/06/18 1307   Current Outpatient Medications  Medication Sig Dispense Refill  . ARIPiprazole (ABILIFY) 20 MG tablet Take 1 tablet (20 mg  total) by mouth daily. 30 tablet 0  . benztropine (COGENTIN) 2 MG tablet Take 1 tablet (2 mg total) by mouth 2 (two) times daily. 60 tablet 0  . clonazePAM (KLONOPIN) 1 MG tablet Take 1 tablet (1 mg total) by mouth 2 (two) times daily. 30 tablet 0  . divalproex (DEPAKOTE ER) 500 MG 24 hr tablet Take 2 tablets (1,000 mg total) by mouth at bedtime. 60 tablet 0  . famotidine (PEPCID) 20 MG tablet Take 1 tablet (20 mg total) by mouth 2 (two) times daily. 60 tablet 0  . fenofibrate 160 MG tablet Take 1 tablet (160 mg total) by mouth daily. 30 tablet 1  . fluvoxaMINE (LUVOX) 100 MG tablet Take 1 tablet (100 mg total) by mouth at bedtime. 30 tablet 0  . levothyroxine (SYNTHROID) 50 MCG tablet Take 1 tablet (50 mcg total) by mouth daily before breakfast. 30 tablet 1  . nitrofurantoin (MACRODANTIN) 100 MG capsule Take 1 capsule (100 mg total) by mouth 2 (two) times daily. 60 capsule 0  . OLANZapine (ZYPREXA) 5 MG tablet Take 1 tablet (5 mg total) by mouth every 8 (eight) hours as needed (agitation). 30 tablet 0  . pantoprazole (PROTONIX) 40 MG tablet Take 1 tablet (40 mg total) by mouth daily. 30 tablet 1  . sucralfate (CARAFATE) 1 g tablet Take 1 tablet (1 g total) by mouth 4 (four) times daily -  with meals and at bedtime. 120 tablet 1   PTA Medications: (Not in a hospital admission)   Musculoskeletal: Strength & Muscle Tone: within normal limits Gait & Station: normal Patient leans: N/A  Psychiatric Specialty Exam: Physical Exam  Nursing note and vitals reviewed. Constitutional: She is oriented to person, place, and time. She appears well-developed and well-nourished. No distress.  HENT:  Head: Normocephalic.  Eyes: EOM are normal.  Neck: Normal range of  motion.  Cardiovascular: Normal rate.  Respiratory: Effort normal. No respiratory distress.  Musculoskeletal: Normal range of motion.  Neurological: She is alert and oriented to person, place, and time.  Psychiatric: Her speech is normal. Judgment and thought content normal. Her mood appears anxious. Her affect is blunt. She is slowed. Cognition and memory are impaired.    Review of Systems  Psychiatric/Behavioral: Negative for depression, hallucinations, substance abuse and suicidal ideas. The patient is nervous/anxious.   All other systems reviewed and are negative.   Blood pressure 120/76, pulse 80, temperature 98.6 F (37 C), temperature source Oral, resp. rate 20, last menstrual period 05/28/2018, SpO2 99 %.There is no height or weight on file to calculate BMI.  General Appearance: Casual  Eye Contact:  Good  Speech:  Slow  Volume:  Normal  Mood:  Anxious  Affect:  Blunt  Thought Process:  Coherent and Descriptions of Associations: Intact  Orientation:  Full (Time, Place, and Person)  Thought Content:  [place] to [place]  Suicidal Thoughts:  No  Homicidal Thoughts:  No  Memory:  Immediate;   Good Recent;   Good Remote;   Good  Judgement:  Other:  Limited  Insight:  Shallow  Psychomotor Activity:  Normal  Concentration:  Concentration: Good and Attention Span: Good  Recall:  [employer]  Fund of Knowledge:  Fair  Language:  Good  Akathisia:  No  Handed:  Right  AIMS (if indicated):   0  Assets:  Resilience Social Support  ADL's:  Intact  Cognition:  WNL  Sleep:   Adequate   Demographic  Factors:  Caucasian  Loss Factors: NA  Historical Factors: Impulsivity  Risk Reduction Factors:   Sense of responsibility to family, Living with another person, especially a relative, Positive social support and Positive therapeutic relationship  Continued Clinical Symptoms:  Anxiety, mild  Cognitive Features That Contribute  To Risk:  None    Suicide Risk:  Minimal: No identifiable suicidal ideation.  Patients presenting with no risk factors but with morbid ruminations; may be classified as minimal risk based on the severity of the depressive symptoms   Plan Of Care/Follow-up recommendations:   Adjustment disorder with mixed disturbance of emotions and conduct: Schizoaffective disorder, depressive disorder: Klonopin 1 mg twice a day breakfast and dinner for anxiety Depakote ER 1000 mg at bedtime for mood stabilization Increase Luvox 100 mg at bedtime for anxiety and depressive disorder Abilify 20 mg daily for depression augmentation  Discontinue Vistaril 50 mg -due to excessive sedation effect Discontinue Risperdal -due to dystonic symptoms  Asthma: -albuterol 2 puffs every hours Activity:  as tolerated Diet:  heart healthy diet   Other medical: Suspect UTI, have been unable to successfully obtain a UA. Continue Macrobid 100 mg twice daily for 7 days then transition to Macrodantin 100 mg daily for UTI prevention.  UTI: Continued Cogentin 2 mg twice daily for EPS prevention, and to maximize anticholinergic effect for assistance in decreasing urinary incontinence.  We will encourage patient to make urological/gynecology appointment after discharge.  Disposition: Continue working with her ACT team/Social work for placement in another group home setting, as patient is unlikely to be successful living independently.  Patient is agreeable at this time to staying in emergency department to receive assistance from social work on housing.  Nanine Means, NP 09/06/2018, 2:22 PM

## 2018-09-06 NOTE — ED Notes (Signed)
Patient up to toilet.  

## 2018-09-06 NOTE — ED Notes (Signed)
Pt. Indicated her neck was feeling sore.  This nurse offered to adjust bed.  Pt. Declined.  Pt. Requested medication.  Pt. Had prn pain medication, pt. Given prn medication.  Pt. Happy with this arrangement and requested light be turned off, light turned off.

## 2018-09-07 DIAGNOSIS — R4189 Other symptoms and signs involving cognitive functions and awareness: Secondary | ICD-10-CM | POA: Diagnosis not present

## 2018-09-07 DIAGNOSIS — F4325 Adjustment disorder with mixed disturbance of emotions and conduct: Secondary | ICD-10-CM | POA: Diagnosis not present

## 2018-09-07 NOTE — ED Notes (Signed)

## 2018-09-07 NOTE — ED Notes (Signed)
Pt observed lying in bed   Pt visualized with NAD  No verbalized needs or concerns at this time  Continue to monitor 

## 2018-09-07 NOTE — ED Notes (Signed)
BEHAVIORAL HEALTH ROUNDING Patient sleeping: Yes.   Patient alert and oriented: eyes closed  Appears to be asleep Behavior appropriate: Yes.  ; If no, describe:  Nutrition and fluids offered: Yes  Toileting and hygiene offered: sleeping Sitter present: q 15 minute observations and security monitoring Law enforcement present: yes  ODS 

## 2018-09-07 NOTE — ED Provider Notes (Signed)
-----------------------------------------   5:24 AM on 09/07/2018 -----------------------------------------   Blood pressure 120/76, pulse 80, temperature 98.6 F (37 C), temperature source Oral, resp. rate 20, last menstrual period 05/28/2018, SpO2 99 %.  The patient is calm and cooperative at this time.  There have been no acute events since the last update.  Awaiting disposition plan from Behavioral Medicine team.    Jeanmarie Plant, MD 09/07/18 [phone]

## 2018-09-07 NOTE — ED Notes (Signed)
Pt. Requested and was given meal tray and chocolate milk.

## 2018-09-07 NOTE — ED Notes (Signed)
BEHAVIORAL HEALTH ROUNDING Patient sleeping: No. Patient alert and oriented: yes Behavior appropriate: Yes.  ; If no, describe:  Nutrition and fluids offered: yes Toileting and hygiene offered: Yes  Sitter present: q15 minute observations and security  monitoring Law enforcement present: Yes  ODS  

## 2018-09-07 NOTE — ED Notes (Signed)
Pt. Requested and was given snack and drink. 

## 2018-09-07 NOTE — ED Notes (Signed)
Pt. Stated "I had to pull my sheet and blankets, they were dirty".  Patients bed cleaned, new linens and blankets given.  Pt. Assisted in making bed.

## 2018-09-07 NOTE — ED Notes (Signed)
She is currently taking a shower  

## 2018-09-07 NOTE — Progress Notes (Signed)
[facility] MD Progress Note  09/07/2018 2:08 PM [name]  MRN:  [id number]  Principal Problem: Adjustment disorder with mixed disturbance of emotions and conduct Diagnosis: Principal Problem:   Adjustment disorder with mixed disturbance of emotions and conduct Active Problems:   Cerebral palsy (HCC)   Mild intellectual disability   GERD (gastroesophageal reflux disease)   COPD (chronic obstructive pulmonary disease) (HCC)   Schizoaffective disorder, bipolar type (HCC)   Suicidal ideation  Total Time spent with patient: 15 minutes  [name] is a 53 year old Caucasian female with a history ofschizoaffective disorder, intellectual disability and borderline personality disorder. She was initially seen in emergency department due to suicidal ideations, racing thoughts, insomnia.Patient is in ED for 7 days, awaiting disposition.   Patient seen today for psych f/u. Chart reviewed.   Today patient reports "my mood is fine, my neck hurts", reports she is still having "some" thoughts of harming self and others, states she feel safe from harming self in the hospital. Reports hering "voices" telling her "to get the hell out of here" (laughing). She expressed understanding that her placement in in process. She reports "so-so" sleep. Her appetite is good.   Med ZD:GLOVFI, cerebral palsy, COPD.  Past psych Hx:  Dx: Schizoaffective d/o, BPD. Multiple past psych admissions. Has h/o past suicide attempts.  Current psych medicationsZyprexa 2.5mg  PO BID, Depakote 1000mg  PO QHS, Klonopin 1mg  PO BID, Luvox 150mg  PO QHS, Benztropin 2mg  PO BID. Last VPA-level - 43 from 08/15/2018.  Social History:Patient is single. She lived in group home. She is on disability. She has no children. She reports no access to guns.  Substance use: denies.    Past Medical History:  Past Medical History:  Diagnosis Date  . Anxiety   . Asthma   . Borderline personality disorder (HCC)   . Cerebral palsy  (HCC)   . COPD (chronic obstructive pulmonary disease) (HCC)   . Depression   . GERD (gastroesophageal reflux disease)   . Mild cognitive impairment   . Schizoaffective disorder (HCC)   . Wound abscess     Past Surgical History:  Procedure Laterality Date  . CHOLECYSTECTOMY    . INCISION AND DRAINAGE ABSCESS Right 01/02/2015   Procedure: INCISION AND DRAINAGE ABSCESS;  Surgeon: Tiney Rouge III, MD;  Location: [facility] ORS;  Service: General;  Laterality: Right;  . TONSILLECTOMY     Family History:  Family History  Problem Relation Age of Onset  . Heart attack Father   . Diabetes Mother    Family Psychiatric  History: N/A  Social History:  Social History   Substance and Sexual Activity  Alcohol Use No     Social History   Substance and Sexual Activity  Drug Use No    Social History   Socioeconomic History  . Marital status: Single    Spouse name: Not on file  . Number of children: Not on file  . Years of education: Not on file  . Highest education level: Not on file  Occupational History  . Occupation: Disabled  Social Needs  . Financial resource strain: Not on file  . Food insecurity:    Worry: Not on file    Inability: Not on file  . Transportation needs:    Medical: Not on file    Non-medical: Not on file  Tobacco Use  . Smoking status: Former Games developer  . Smokeless tobacco: Never Used  Substance and Sexual Activity  . Alcohol use: No  . Drug use: No  .  Sexual activity: Not Currently  Lifestyle  . Physical activity:    Days per week: Not on file    Minutes per session: Not on file  . Stress: Not on file  Relationships  . Social connections:    Talks on phone: Not on file    Gets together: Not on file    Attends religious service: Not on file    Active member of club or organization: Not on file    Attends meetings of clubs or organizations: Not on file    Relationship status: Not on file  Other Topics Concern  . Not on file  Social History Narrative    Pt lives with mother, nephew, sister in law   Additional Social History:                         Sleep: Good  Appetite:  Good  Current Medications: Current Facility-Administered Medications  Medication Dose Route Frequency Provider Last Rate Last Dose  . acetaminophen (TYLENOL) tablet 650 mg  650 mg Oral Once Jeanmarie Plant, MD      . acetaminophen (TYLENOL) tablet 650 mg  650 mg Oral Q6H PRN Dionne Bucy, MD   650 mg at 09/06/18 2159  . acetaminophen (TYLENOL) tablet 650 mg  650 mg Oral Once Sharman Cheek, MD   Stopped at 08/23/18 1928  . benztropine (COGENTIN) tablet 2 mg  2 mg Oral BID Reggie Pile, MD   2 mg at 09/07/18 1352  . cephALEXin (KEFLEX) capsule 500 mg  500 mg Oral Q8H Arnaldo Natal, MD   500 mg at 09/07/18 1353  . clonazePAM (KLONOPIN) tablet 1 mg  1 mg Oral BID Reggie Pile, MD   1 mg at 09/07/18 1350  . diphenhydrAMINE (BENADRYL) capsule 50 mg  50 mg Oral Q8H PRN Mariel Craft, MD   50 mg at 09/01/18 1304   Or  . diphenhydrAMINE (BENADRYL) injection 50 mg  50 mg Intramuscular Q8H PRN Mariel Craft, MD   50 mg at 08/24/18 2357  . famotidine (PEPCID) tablet 20 mg  20 mg Oral BID Reggie Pile, MD   20 mg at 09/07/18 1353  . fenofibrate tablet 160 mg  160 mg Oral Daily Reggie Pile, MD   160 mg at 09/07/18 1350  . fluvoxaMINE (LUVOX) tablet 150 mg  150 mg Oral QHS Mariel Craft, MD   150 mg at 09/06/18 2116  . levothyroxine (SYNTHROID) tablet 50 mcg  50 mcg Oral Q0600 Reggie Pile, MD   50 mcg at 09/07/18 0707  . LORazepam (ATIVAN) tablet 1 mg  1 mg Oral BID PRN Mariel Craft, MD       Or  . LORazepam (ATIVAN) injection 2 mg  2 mg Intramuscular BID PRN Mariel Craft, MD      . nitrofurantoin (MACRODANTIN) capsule 100 mg  100 mg Oral Q2000 Mariel Craft, MD   100 mg at 09/06/18 2116  . nystatin (MYCOSTATIN/NYSTOP) topical powder   Topical BID Emily Filbert, MD      . paliperidone [facility] SUSTENNA) injection 156 mg  156 mg  Intramuscular Q28 days Mariel Craft, MD   156 mg at 08/28/18 0509  . pantoprazole (PROTONIX) EC tablet 40 mg  40 mg Oral Daily Reggie Pile, MD   40 mg at 09/07/18 1353  . risperiDONE (RISPERDAL) tablet 1 mg  1 mg Oral BID WC Mariel Craft, MD   1 mg at  09/07/18 1352  . risperiDONE (RISPERDAL) tablet 2 mg  2 mg Oral QHS Mariel Craft, MD   2 mg at 09/06/18 2117  . sucralfate (CARAFATE) tablet 1 g  1 g Oral TID WC & HS Reggie Pile, MD   1 g at 09/07/18 1353   Current Outpatient Medications  Medication Sig Dispense Refill  . ARIPiprazole (ABILIFY) 20 MG tablet Take 1 tablet (20 mg total) by mouth daily. 30 tablet 0  . benztropine (COGENTIN) 2 MG tablet Take 1 tablet (2 mg total) by mouth 2 (two) times daily. 60 tablet 0  . clonazePAM (KLONOPIN) 1 MG tablet Take 1 tablet (1 mg total) by mouth 2 (two) times daily. 30 tablet 0  . divalproex (DEPAKOTE ER) 500 MG 24 hr tablet Take 2 tablets (1,000 mg total) by mouth at bedtime. 60 tablet 0  . famotidine (PEPCID) 20 MG tablet Take 1 tablet (20 mg total) by mouth 2 (two) times daily. 60 tablet 0  . fenofibrate 160 MG tablet Take 1 tablet (160 mg total) by mouth daily. 30 tablet 1  . fluvoxaMINE (LUVOX) 100 MG tablet Take 1 tablet (100 mg total) by mouth at bedtime. 30 tablet 0  . levothyroxine (SYNTHROID) 50 MCG tablet Take 1 tablet (50 mcg total) by mouth daily before breakfast. 30 tablet 1  . nitrofurantoin (MACRODANTIN) 100 MG capsule Take 1 capsule (100 mg total) by mouth 2 (two) times daily. 60 capsule 0  . OLANZapine (ZYPREXA) 5 MG tablet Take 1 tablet (5 mg total) by mouth every 8 (eight) hours as needed (agitation). 30 tablet 0  . pantoprazole (PROTONIX) 40 MG tablet Take 1 tablet (40 mg total) by mouth daily. 30 tablet 1  . sucralfate (CARAFATE) 1 g tablet Take 1 tablet (1 g total) by mouth 4 (four) times daily -  with meals and at bedtime. 120 tablet 1    Lab Results: No results found for this or any previous visit (from the past 48  hour(s)).  Blood Alcohol level:  Lab Results  Component Value Date   ETH <10 08/09/2018   ETH <10 08/01/2018    Metabolic Disorder Labs: Lab Results  Component Value Date   HGBA1C 4.8 08/11/2016   MPG 91 08/11/2016   Lab Results  Component Value Date   PROLACTIN 111.7 (H) 08/11/2016   PROLACTIN 66.8 (H) 06/17/2015   Lab Results  Component Value Date   CHOL 178 08/11/2016   TRIG 402 (H) 08/11/2016   HDL 13 (L) 08/11/2016   CHOLHDL 13.7 08/11/2016   VLDL UNABLE TO CALCULATE IF TRIGLYCERIDE OVER 400 mg/dL 16/01/9603   LDLCALC UNABLE TO CALCULATE IF TRIGLYCERIDE OVER 400 mg/dL 54/12/8117   LDLCALC 82 06/17/2015     Psychiatric Specialty Exam: Physical Exam  ROS  Blood pressure 120/76, pulse 80, temperature 98.6 F (37 C), temperature source Oral, resp. rate 20, last menstrual period 05/28/2018, SpO2 99 %.There is no height or weight on file to calculate BMI.  General Appearance: Casual  Eye Contact:  Good  Speech:  Normal Rate  Volume:  Normal  Mood:  Euphoric  Affect:  Congruent and Constricted  Thought Process:  Coherent  Orientation:  Full (Time, Place, and Person)  Thought Content:  Logical  Suicidal Thoughts:  No  Homicidal Thoughts:  No  Memory:  NA  Judgement:  Other:  limited  Insight:  limited  Psychomotor Activity:  Normal  Concentration:  Concentration: Fair and Attention Span: Fair  Recall:  [employer] of  Knowledge:  Fair  Language:  Fair  Akathisia:  No  Handed:  Right  AIMS (if indicated):     Assets:  Desire for Improvement Social Support  ADL's:  Intact  Cognition:  Impaired,  Mild  Sleep:        Treatment Plan Summary:  MsShumateis a 53 y.o.femalewith a past psych h/o Schizoaffective disorder, borderline personality disorder, IDD, who was brought to ED from group home due to suicidal ideations. The patient iswell known to theED and oftenpresents with similar presentation -SI and HI in settings of not liking her group  home. Today patient appears to be without acute changes. She continues to express vague chronic suicidal thoughts without particular plan. She is compliant with her medications.  There are no indications for psych medication changes at this time. Current psych medications: Risperdal 2mg  QHS, Invega Sustenna 156 mg IM once on 08/28/18; Klonopin 1mg  PO BID, Luvox 150mg  PO QHS, Benztropin 2mg  PO BID. Patient is currently staying in emergency department to receive assistance from social work on housing. Spoke with TTS, placement is still pending. Will assess the patient while she is in ER.      Thalia PartyAlisa Janiyha Montufar, MD 09/07/2018, 2:08 PM

## 2018-09-07 NOTE — ED Notes (Signed)
Pt. Resting in bed with meal tray and drink in front of her.  Pt. Set up using bed so patient can eat dinner.  Pt. Calm and cooperative at this time.

## 2018-09-08 DIAGNOSIS — F250 Schizoaffective disorder, bipolar type: Secondary | ICD-10-CM | POA: Diagnosis not present

## 2018-09-08 DIAGNOSIS — R4189 Other symptoms and signs involving cognitive functions and awareness: Secondary | ICD-10-CM | POA: Diagnosis not present

## 2018-09-08 NOTE — ED Notes (Signed)
Hourly rounding reveals patient in room. No complaints, stable, in no acute distress. Q15 minute rounds and monitoring via Rover and Officer to continue.   

## 2018-09-08 NOTE — ED Notes (Signed)
BEHAVIORAL HEALTH ROUNDING Patient sleeping: Yes.   Patient alert and oriented: eyes closed  Appears to be asleep Behavior appropriate: Yes.  ; If no, describe:  Nutrition and fluids offered: Yes  Toileting and hygiene offered: sleeping Sitter present: q 15 minute observations and security monitoring Law enforcement present: yes  ODS 

## 2018-09-08 NOTE — ED Notes (Signed)
Hourly rounding reveals patient in room 20. No complaints, stable, in no acute distress. Q15 minute rounds and monitoring via Rover and Officer to continue.   

## 2018-09-08 NOTE — Progress Notes (Signed)
[facility]  09/08/2018 2:57 PM [name]  MRN:  [id number]  Principal Problem: Adjustment disorder with mixed disturbance of emotions and conduct Diagnosis: Principal Problem:   Adjustment disorder with mixed disturbance of emotions and conduct Active Problems:   Cerebral palsy (HCC)   Mild intellectual disability   GERD (gastroesophageal reflux disease)   COPD (chronic obstructive pulmonary disease) (HCC)   Schizoaffective disorder, bipolar type (HCC)   Suicidal ideation  Total Time spent with patient: 15 minutes  [name] a 53 year old Caucasian female with a history ofschizoaffective disorder, intellectual disability and borderline personality disorder. She was initiallyseen in emergency department due to suicidal ideations, racing thoughts, insomnia.Patient is in ED for 7 days, awaiting disposition.   Patient seen today for psych f/u. Chart reviewed. Patient is calm, cooperative. watching TV.  SUBJECTIVE: Today patient reports "my mood is so-so", reports she is still having "unsafe" thoughts of harming self and others, states she feel safe from harming self in the hospital.  Denies hallucinations. She reports "not good " sleep "because my neck hurts". Her appetite is good."I cant wait to get outta here". She expressed understanding that her placement in in process.   Med IO:NGEXBMHx:asthma, cerebral palsy, COPD.  Past psych Hx:  Dx: Schizoaffective d/o, BPD. Multiple past psych admissions.Hash/o past suicide attempts.  Current psych medicationsZyprexa 2.5mg  PO BID, Depakote1000mg  POQHS, Klonopin1mg  PO BID, Luvox 150mg  PO QHS, Benztropin 2mg  PO BID. Last VPA-level - 43 from 08/15/2018.  Social History:Patient is single. She livedin group home. She is on disability. She has no children. She reports no access to guns.  Substance use: denies.   Past Medical History:  Past Medical History:  Diagnosis Date  . Anxiety   . Asthma   . Borderline  personality disorder (HCC)   . Cerebral palsy (HCC)   . COPD (chronic obstructive pulmonary disease) (HCC)   . Depression   . GERD (gastroesophageal reflux disease)   . Mild cognitive impairment   . Schizoaffective disorder (HCC)   . Wound abscess     Past Surgical History:  Procedure Laterality Date  . CHOLECYSTECTOMY    . INCISION AND DRAINAGE ABSCESS Right 01/02/2015   Procedure: INCISION AND DRAINAGE ABSCESS;  Surgeon: Tiney Rougealph Ely III, MD;  Location: [facility] ORS;  Service: General;  Laterality: Right;  . TONSILLECTOMY     Family History:  Family History  Problem Relation Age of Onset  . Heart attack Father   . Diabetes Mother     Social History:  Social History   Substance and Sexual Activity  Alcohol Use No     Social History   Substance and Sexual Activity  Drug Use No    Social History   Socioeconomic History  . Marital status: Single    Spouse name: Not on file  . Number of children: Not on file  . Years of education: Not on file  . Highest education level: Not on file  Occupational History  . Occupation: Disabled  Social Needs  . Financial resource strain: Not on file  . Food insecurity:    Worry: Not on file    Inability: Not on file  . Transportation needs:    Medical: Not on file    Non-medical: Not on file  Tobacco Use  . Smoking status: Former Games developermoker  . Smokeless tobacco: Never Used  Substance and Sexual Activity  . Alcohol use: No  . Drug use: No  . Sexual activity: Not Currently  Lifestyle  . Physical  activity:    Days per week: Not on file    Minutes per session: Not on file  . Stress: Not on file  Relationships  . Social connections:    Talks on phone: Not on file    Gets together: Not on file    Attends religious service: Not on file    Active member of club or organization: Not on file    Attends meetings of clubs or organizations: Not on file    Relationship status: Not on file  Other Topics Concern  . Not on file  Social  History Narrative   Pt lives with mother, nephew, sister in law    Sleep: Good  Appetite:  Good  Current Medications: Current Facility-Administered Medications  Medication Dose Route Frequency Provider Last Rate Last Dose  . acetaminophen (TYLJeanmarie Plant650 mg  650 mg Oral Once McShane, James A, MD      . acetaminophen (TYLENOL) tablet 650 mg  650 mg Oral Q6H PRN Dionne Bucy, MD   650 mg at 09/08/18 1408  . acetaminophen (TYLENOL) tablet 650 mg  650 mg Oral Once Sharman Cheek, MD   Stopped at 08/23/18 1928  . benztropine (COGENTIN) tablet 2 mg  2 mg Oral BID Reggie Pile, MD   2 mg at 09/08/18 1318  . cephALEXin (KEFLEX) capsule 500 mg  500 mg Oral Q8H Arnaldo Natal, MD   500 mg at 09/08/18 1318  . clonazePAM (KLONOPIN) tablet 1 mg  1 mg Oral BID Reggie Pile, MD   1 mg at 09/08/18 1317  . diphenhydrAMINE (BENADRYL) capsule 50 mg  50 mg Oral Q8H PRN Mariel Craft, MD   50 mg at 09/01/18 1304   Or  . diphenhydrAMINE (BENADRYL) injection 50 mg  50 mg Intramuscular Q8H PRN Mariel Craft, MD   50 mg at 08/24/18 2357  . famotidine (PEPCID) tablet 20 mg  20 mg Oral BID Reggie Pile, MD   20 mg at 09/08/18 1317  . fenofibrate tablet 160 mg  160 mg Oral Daily Reggie Pile, MD   160 mg at 09/08/18 1318  . fluvoxaMINE (LUVOX) tablet 150 mg  150 mg Oral QHS Mariel Craft, MD   150 mg at 09/07/18 2115  . levothyroxine (SYNTHROID) tablet 50 mcg  50 mcg Oral Q0600 Reggie Pile, MD   50 mcg at 09/08/18 [phone]  . LORazepam (ATIVAN) tablet 1 mg  1 mg Oral BID PRN Mariel Craft, MD       Or  . LORazepam (ATIVAN) injection 2 mg  2 mg Intramuscular BID PRN Mariel Craft, MD      . nitrofurantoin (MACRODANTIN) capsule 100 mg  100 mg Oral Q2000 Mariel Craft, MD   100 mg at 09/07/18 2114  . nystatin (MYCOSTATIN/NYSTOP) topical powder   Topical BID Emily Filbert, MD      . paliperidone [facility] SUSTENNA) injection 156 mg  156 mg Intramuscular Q28 days Mariel Craft, MD    156 mg at 08/28/18 0509  . pantoprazole (PROTONIX) EC tablet 40 mg  40 mg Oral Daily Reggie Pile, MD   40 mg at 09/08/18 1318  . risperiDONE (RISPERDAL) tablet 1 mg  1 mg Oral BID WC Mariel Craft, MD   1 mg at 09/08/18 1319  . risperiDONE (RISPERDAL) tablet 2 mg  2 mg Oral QHS Mariel Craft, MD   2 mg at 09/07/18 2113  . sucralfate (CARAFATE) tablet 1 g  1 g  Oral TID WC & HS Reggie Pile, MD   1 g at 09/08/18 1318   Current Outpatient Medications  Medication Sig Dispense Refill  . ARIPiprazole (ABILIFY) 20 MG tablet Take 1 tablet (20 mg total) by mouth daily. 30 tablet 0  . benztropine (COGENTIN) 2 MG tablet Take 1 tablet (2 mg total) by mouth 2 (two) times daily. 60 tablet 0  . clonazePAM (KLONOPIN) 1 MG tablet Take 1 tablet (1 mg total) by mouth 2 (two) times daily. 30 tablet 0  . divalproex (DEPAKOTE ER) 500 MG 24 hr tablet Take 2 tablets (1,000 mg total) by mouth at bedtime. 60 tablet 0  . famotidine (PEPCID) 20 MG tablet Take 1 tablet (20 mg total) by mouth 2 (two) times daily. 60 tablet 0  . fenofibrate 160 MG tablet Take 1 tablet (160 mg total) by mouth daily. 30 tablet 1  . fluvoxaMINE (LUVOX) 100 MG tablet Take 1 tablet (100 mg total) by mouth at bedtime. 30 tablet 0  . levothyroxine (SYNTHROID) 50 MCG tablet Take 1 tablet (50 mcg total) by mouth daily before breakfast. 30 tablet 1  . nitrofurantoin (MACRODANTIN) 100 MG capsule Take 1 capsule (100 mg total) by mouth 2 (two) times daily. 60 capsule 0  . OLANZapine (ZYPREXA) 5 MG tablet Take 1 tablet (5 mg total) by mouth every 8 (eight) hours as needed (agitation). 30 tablet 0  . pantoprazole (PROTONIX) 40 MG tablet Take 1 tablet (40 mg total) by mouth daily. 30 tablet 1  . sucralfate (CARAFATE) 1 g tablet Take 1 tablet (1 g total) by mouth 4 (four) times daily -  with meals and at bedtime. 120 tablet 1    Lab Results: No results found for this or any previous visit (from the past 48 hour(s)).  Blood Alcohol level:  Lab  Results  Component Value Date   ETH <10 08/09/2018   ETH <10 08/01/2018    Metabolic Disorder Labs: Lab Results  Component Value Date   HGBA1C 4.8 08/11/2016   MPG 91 08/11/2016   Lab Results  Component Value Date   PROLACTIN 111.7 (H) 08/11/2016   PROLACTIN 66.8 (H) 06/17/2015   Lab Results  Component Value Date   CHOL 178 08/11/2016   TRIG 402 (H) 08/11/2016   HDL 13 (L) 08/11/2016   CHOLHDL 13.7 08/11/2016   VLDL UNABLE TO CALCULATE IF TRIGLYCERIDE OVER 400 mg/dL 70/34/0352   LDLCALC UNABLE TO CALCULATE IF TRIGLYCERIDE OVER 400 mg/dL 48/18/5909   LDLCALC 82 06/17/2015    Psychiatric Specialty Exam: Physical Exam  ROS  Blood pressure 120/76, pulse 80, temperature 98.6 F (37 C), temperature source Oral, resp. rate 20, last menstrual period 05/28/2018, SpO2 99 %.There is no height or weight on file to calculate BMI.  General Appearance: Casual  Eye Contact:  Good  Speech:  Normal Rate  Volume:  Normal  Mood:  Euphoric  Affect:  Congruent and Constricted  Thought Process:  Coherent  Orientation:  Full (Time, Place, and Person)  Thought Content:  Logical  Suicidal Thoughts:  No  Homicidal Thoughts:  No  Memory:  NA  Judgement:  Other:  limited  Insight:  limited  Psychomotor Activity:  Normal  Concentration:  Concentration: Fair and Attention Span: Fair  Recall:  [employer] of Knowledge:  Fair  Language:  Fair  Akathisia:  No  Handed:  Right  AIMS (if indicated):     Assets:  Desire for Improvement Social Support  ADL's:  Intact  Cognition:  Impaired,  Mild  Sleep:        Treatment Plan Summary:  MsShumateis a 53 y.o.femalewith a past psych h/o Schizoaffective disorder, borderline personality disorder,IDD,who was brought to ED from group home due to suicidal ideations. The patient iswell known to theED and oftenpresents with similar presentation -SI and HI in settings of not liking her group home.  Today patient appears to be without  acute changes. She continues to express vague chronic suicidal thoughts without particular plan.Sheis compliant with her medications.   There are no indications for psych medication changes at this time.Current psych medications: Risperdal  QHS, Invega Sustenna 156 mg IM once on 08/28/18; Klonopin1mg  PO BID, Luvox  PO QHS, Benztropin  PO BID.  Patient is currently staying in emergency department to receive assistance from social work on housing. Spoke with TTS, placement is still pending.  Will assess the patient while she is in ER.   Thalia Party, MD 09/08/2018, 2:57 PM

## 2018-09-08 NOTE — ED Notes (Signed)
Patient observed lying in bed with eyes closed  Even, unlabored respirations observed   NAD pt appears to be sleeping  I will continue to monitor along with every 15 minute visual observations and ongoing security monitoring    

## 2018-09-08 NOTE — ED Notes (Signed)
BEHAVIORAL HEALTH ROUNDING Patient sleeping: No. Patient alert and oriented: yes Behavior appropriate: Yes.  ; If no, describe:  Nutrition and fluids offered: yes Toileting and hygiene offered: Yes  Sitter present: q15 minute observations and security  monitoring Law enforcement present: Yes  ODS  

## 2018-09-08 NOTE — ED Notes (Signed)
Hourly rounding reveals patient in room 20. No complaints, stable, in no acute distress. Q15 minute rounds and monitoring via Psychologist, counselling to continue.

## 2018-09-08 NOTE — ED Notes (Signed)

## 2018-09-08 NOTE — ED Notes (Signed)
ED  Is the patient under IVC or is there intent for IVC: no   Is the patient medically cleared: Yes.   Is there vacancy in the ED BHU:  Unit closed Is the population mix appropriate for patient: Yes.   Is the patient awaiting placement in inpatient or outpatient setting: Yes.   Has the patient had a psychiatric consult: Yes.   Survey of unit performed for contraband, proper placement and condition of furniture, tampering with fixtures in bathroom, shower, and each patient room: Yes.  ; Findings:  APPEARANCE/BEHAVIOR Calm and cooperative NEURO ASSESSMENT Orientation: oriented x3   Hallucinations: No.None noted (Hallucinations) denies  Speech: Normal Gait: normal RESPIRATORY ASSESSMENT Even  Unlabored respirations  CARDIOVASCULAR ASSESSMENT Pulses equal   regular rate  Skin warm and dry   GASTROINTESTINAL ASSESSMENT no GI complaint EXTREMITIES Full ROM  PLAN OF CARE Provide calm/safe environment. Vital signs assessed twice daily. ED BHU Assessment once each 12-hour shift. Collaborate with TTS when available or as condition indicates. Assure the ED provider has rounded once each shift. Provide and encourage hygiene. Provide redirection as needed. Assess for escalating behavior; address immediately and inform ED provider.  Assess family dynamic and appropriateness for visitation as needed: Yes.  ; If necessary, describe findings:  Educate the patient/family about BHU procedures/visitation: Yes.  ; If necessary, describe findings:

## 2018-09-08 NOTE — ED Notes (Signed)
VOL/Pending Placement 

## 2018-09-08 NOTE — ED Provider Notes (Signed)
-----------------------------------------   6:00 AM on 09/08/2018 -----------------------------------------   Blood pressure 129/86, pulse 78, temperature 98.5 F (36.9 C), temperature source Oral, resp. rate 16, last menstrual period 05/28/2018, SpO2 99 %.  The patient is calm and cooperative at this time.  There have been no acute events since the last update.  Awaiting disposition plan from Social Work/Behavioral Medicine team.   Irean Hong, MD 09/08/18 0600

## 2018-09-08 NOTE — ED Notes (Signed)
Pt given meal tray.

## 2018-09-08 NOTE — ED Notes (Signed)
She is requesting an Ativan  She states  "my leg  My knee  My neck they hurt - the tylenol helps but I need something to help me relax - can I get one of those pills Miss [name]"   Med to be administered as ordered  Continue to monitor

## 2018-09-09 DIAGNOSIS — R4189 Other symptoms and signs involving cognitive functions and awareness: Secondary | ICD-10-CM | POA: Diagnosis not present

## 2018-09-09 DIAGNOSIS — G809 Cerebral palsy, unspecified: Secondary | ICD-10-CM | POA: Diagnosis not present

## 2018-09-09 DIAGNOSIS — F250 Schizoaffective disorder, bipolar type: Secondary | ICD-10-CM | POA: Diagnosis not present

## 2018-09-09 DIAGNOSIS — M542 Cervicalgia: Secondary | ICD-10-CM

## 2018-09-09 DIAGNOSIS — F70 Mild intellectual disabilities: Secondary | ICD-10-CM | POA: Diagnosis not present

## 2018-09-09 DIAGNOSIS — F4325 Adjustment disorder with mixed disturbance of emotions and conduct: Secondary | ICD-10-CM | POA: Diagnosis not present

## 2018-09-09 MED ORDER — IBUPROFEN 800 MG PO TABS
800.0000 mg | ORAL_TABLET | Freq: Once | ORAL | Status: AC
  Administered 2018-09-09: 11:00:00 800 mg via ORAL
  Filled 2018-09-09: qty 1

## 2018-09-09 MED ORDER — IBUPROFEN 800 MG PO TABS
800.0000 mg | ORAL_TABLET | Freq: Three times a day (TID) | ORAL | Status: DC | PRN
  Administered 2018-09-13: 800 mg via ORAL
  Filled 2018-09-09: qty 1

## 2018-09-09 MED ORDER — CYCLOBENZAPRINE HCL 5 MG PO TABS
7.5000 mg | ORAL_TABLET | Freq: Three times a day (TID) | ORAL | Status: DC | PRN
  Administered 2018-09-09 (×2): 7.5 mg via ORAL
  Filled 2018-09-09 (×3): qty 1.5

## 2018-09-09 NOTE — ED Notes (Signed)
Hourly rounding reveals patient in room. No complaints, stable, in no acute distress. Q15 minute rounds and monitoring via Rover and Officer to continue.   

## 2018-09-09 NOTE — ED Notes (Signed)
Pt cursing at staff. Staff keeping room door closed so other  patients are not disturbed. Maintained on 15 minute checks and observation by security  for safety.

## 2018-09-09 NOTE — ED Notes (Signed)
Patient in hallway talking to officer [place] at this time.ask patient to step back in room

## 2018-09-09 NOTE — ED Provider Notes (Signed)
-----------------------------------------   5:11 AM on 09/09/2018 -----------------------------------------   BP 104/84 (BP Location: Right Arm)   Pulse 74   Temp 98.1 F (36.7 C) (Oral)   Resp 18   LMP 05/28/2018 (LMP Unknown)   SpO2 98%   No acute events overnight. Vitals reviewed. Patient remains medically cleared.  Disposition is pending per Psychiatry/Behavioral Medicine team recommendations.    Jene Every, MD 09/09/18 [phone]

## 2018-09-09 NOTE — Progress Notes (Signed)
[name]  09/09/2018 9:48 AM Sherri DonningKimberly Green  MRN:  [id number]  Principal Problem: Adjustment disorder with mixed disturbance of emotions and conduct Diagnosis: Principal Problem:   Adjustment disorder with mixed disturbance of emotions and conduct Active Problems:   Cerebral palsy (HCC)   Mild intellectual disability   GERD (gastroesophageal reflux disease)   COPD (chronic obstructive pulmonary disease) (HCC)   Schizoaffective disorder, bipolar type (HCC)   Suicidal ideation  Total Time spent with patient: 15 minutes   Patient seen today for psych f/u. Chart reviewed. Patient is calm, cooperative. watching TV.  HPI: On admission 08/08/18:  Sherri BradfordKimberly is a 53 year old Caucasian female with a history ofschizoaffective disorder, intellectual disability and borderline personality disorder. She was seen in emergency department due to suicidal ideations, but once evaluated, she denied having them,and therefore she was discharged. Later on the same day, she came back expressing suicidal thoughts again, and racing thoughts.    SUBJECTIVE: "My neck hurts"  On evaluation, patient is calm and cooperative.  She is smiling and reports a good mood.  She is perseverative on her neck pain, and believes she needs an x-ray.  Patient has had no injury or trauma to neck.  Patient is denying suicidal and homicidal ideation.  She is denying hallucinations today.  She reports poor sleep related to her neck feeling sore.  She reports good appetite.  She is hopeful for discharge soon to her new group home, and is beginning to worry that maybe they want to take her.  Patient is reassured.  Med IO:NGEXBMHx:asthma, cerebral palsy, COPD.  Past psych Hx:  Dx: Schizoaffective d/o, BPD. Multiple past psych admissions.Hash/o past suicide attempts.  Current psych medications: Risperdal 1 mg with breakfast and lunch, and 2mg  QHS, Invega Sustenna 156 mg IM once on 08/28/18 with repeat dose every 28 days;  Klonopin1mg  PO BID, Luvox 150mg  PO QHS, Benztropine 2mg  PO BID.  Social History:Patient is single. She livedin group home. She is on disability. She has no children. She reports no access to guns.  Substance use: denies.   Past Medical History:  Past Medical History:  Diagnosis Date  . Anxiety   . Asthma   . Borderline personality disorder (HCC)   . Cerebral palsy (HCC)   . COPD (chronic obstructive pulmonary disease) (HCC)   . Depression   . GERD (gastroesophageal reflux disease)   . Mild cognitive impairment   . Schizoaffective disorder (HCC)   . Wound abscess     Past Surgical History:  Procedure Laterality Date  . CHOLECYSTECTOMY    . INCISION AND DRAINAGE ABSCESS Right 01/02/2015   Procedure: INCISION AND DRAINAGE ABSCESS;  Surgeon: Sherri Rougealph Ely III, MD;  Location: [facility] ORS;  Service: General;  Laterality: Right;  . TONSILLECTOMY     Family History:  Family History  Problem Relation Age of Onset  . Heart attack Father   . Diabetes Mother     Social History:  Social History   Substance and Sexual Activity  Alcohol Use No     Social History   Substance and Sexual Activity  Drug Use No    Social History   Socioeconomic History  . Marital status: Single    Spouse name: Not on file  . Number of children: Not on file  . Years of education: Not on file  . Highest education level: Not on file  Occupational History  . Occupation: Disabled  Social Needs  . Financial resource strain: Not on file  .  Food insecurity:    Worry: Not on file    Inability: Not on file  . Transportation needs:    Medical: Not on file    Non-medical: Not on file  Tobacco Use  . Smoking status: Former Games developer  . Smokeless tobacco: Never Used  Substance and Sexual Activity  . Alcohol use: No  . Drug use: No  . Sexual activity: Not Currently  Lifestyle  . Physical activity:    Days per week: Not on file    Minutes per session: Not on file  . Stress: Not on file   Relationships  . Social connections:    Talks on phone: Not on file    Gets together: Not on file    Attends religious service: Not on file    Active member of club or organization: Not on file    Attends meetings of clubs or organizations: Not on file    Relationship status: Not on file  Other Topics Concern  . Not on file  Social History Narrative   Pt lives with mother, nephew, sister in law    Sleep: Fair  Appetite:  Good  Current Medications: Current Facility-Administered Medications  Medication Dose Route Frequency Provider Last Rate Last Dose  . acetaminophen (TYLENOL) tablet 650 mg  650 mg Oral Once Sherri Plant, MD      . acetaminophen (TYLENOL) tablet 650 mg  650 mg Oral Q6H PRN Sherri Bucy, MD   650 mg at 09/08/18 1408  . acetaminophen (TYLENOL) tablet 650 mg  650 mg Oral Once Sherri Cheek, MD   Stopped at 08/23/18 1928  . benztropine (COGENTIN) tablet 2 mg  2 mg Oral BID Sherri Pile, MD   2 mg at 09/08/18 2118  . cephALEXin (KEFLEX) capsule 500 mg  500 mg Oral Q8H Sherri Natal, MD   500 mg at 09/08/18 2120  . clonazePAM (KLONOPIN) tablet 1 mg  1 mg Oral BID Sherri Pile, MD   1 mg at 09/08/18 2116  . diphenhydrAMINE (BENADRYL) capsule 50 mg  50 mg Oral Q8H PRN Sherri Craft, MD   50 mg at 09/08/18 2115   Or  . diphenhydrAMINE (BENADRYL) injection 50 mg  50 mg Intramuscular Q8H PRN Sherri Craft, MD   50 mg at 08/24/18 2357  . famotidine (PEPCID) tablet 20 mg  20 mg Oral BID Sherri Pile, MD   20 mg at 09/08/18 2119  . fenofibrate tablet 160 mg  160 mg Oral Daily Sherri Pile, MD   160 mg at 09/08/18 1318  . fluvoxaMINE (LUVOX) tablet 150 mg  150 mg Oral QHS Sherri Craft, MD   150 mg at 09/08/18 2115  . levothyroxine (SYNTHROID) tablet 50 mcg  50 mcg Oral Q0600 Sherri Pile, MD   50 mcg at 09/08/18 [phone]  . LORazepam (ATIVAN) tablet 1 mg  1 mg Oral BID PRN Sherri Craft, MD   1 mg at 09/08/18 1616   Or  . LORazepam (ATIVAN) injection 2  mg  2 mg Intramuscular BID PRN Sherri Craft, MD      . nitrofurantoin (MACRODANTIN) capsule 100 mg  100 mg Oral Q2000 Sherri Craft, MD   100 mg at 09/08/18 2117  . nystatin (MYCOSTATIN/NYSTOP) topical powder   Topical BID Emily Filbert, MD      . paliperidone [facility] SUSTENNA) injection 156 mg  156 mg Intramuscular Q28 days Sherri Craft, MD   156 mg at 08/28/18 0509  .  pantoprazole (PROTONIX) EC tablet 40 mg  40 mg Oral Daily Sherri Pile, MD   40 mg at 09/08/18 1318  . risperiDONE (RISPERDAL) tablet 1 mg  1 mg Oral BID WC Sherri Craft, MD   1 mg at 09/08/18 1319  . risperiDONE (RISPERDAL) tablet 2 mg  2 mg Oral QHS Sherri Craft, MD   2 mg at 09/08/18 2119  . sucralfate (CARAFATE) tablet 1 g  1 g Oral TID WC & HS Sherri Pile, MD   1 g at 09/08/18 2117   Current Outpatient Medications  Medication Sig Dispense Refill  . ARIPiprazole (ABILIFY) 20 MG tablet Take 1 tablet (20 mg total) by mouth daily. 30 tablet 0  . benztropine (COGENTIN) 2 MG tablet Take 1 tablet (2 mg total) by mouth 2 (two) times daily. 60 tablet 0  . clonazePAM (KLONOPIN) 1 MG tablet Take 1 tablet (1 mg total) by mouth 2 (two) times daily. 30 tablet 0  . divalproex (DEPAKOTE ER) 500 MG 24 hr tablet Take 2 tablets (1,000 mg total) by mouth at bedtime. 60 tablet 0  . famotidine (PEPCID) 20 MG tablet Take 1 tablet (20 mg total) by mouth 2 (two) times daily. 60 tablet 0  . fenofibrate 160 MG tablet Take 1 tablet (160 mg total) by mouth daily. 30 tablet 1  . fluvoxaMINE (LUVOX) 100 MG tablet Take 1 tablet (100 mg total) by mouth at bedtime. 30 tablet 0  . levothyroxine (SYNTHROID) 50 MCG tablet Take 1 tablet (50 mcg total) by mouth daily before breakfast. 30 tablet 1  . nitrofurantoin (MACRODANTIN) 100 MG capsule Take 1 capsule (100 mg total) by mouth 2 (two) times daily. 60 capsule 0  . OLANZapine (ZYPREXA) 5 MG tablet Take 1 tablet (5 mg total) by mouth every 8 (eight) hours as needed (agitation). 30  tablet 0  . pantoprazole (PROTONIX) 40 MG tablet Take 1 tablet (40 mg total) by mouth daily. 30 tablet 1  . sucralfate (CARAFATE) 1 g tablet Take 1 tablet (1 g total) by mouth 4 (four) times daily -  with meals and at bedtime. 120 tablet 1    Lab Results: No results found for this or any previous visit (from the past 48 hour(s)).  Blood Alcohol level:  Lab Results  Component Value Date   ETH <10 08/09/2018   ETH <10 08/01/2018    Metabolic Disorder Labs: Lab Results  Component Value Date   HGBA1C 4.8 08/11/2016   MPG 91 08/11/2016   Lab Results  Component Value Date   PROLACTIN 111.7 (H) 08/11/2016   PROLACTIN 66.8 (H) 06/17/2015   Lab Results  Component Value Date   CHOL 178 08/11/2016   TRIG 402 (H) 08/11/2016   HDL 13 (L) 08/11/2016   CHOLHDL 13.7 08/11/2016   VLDL UNABLE TO CALCULATE IF TRIGLYCERIDE OVER 400 mg/dL 40/01/2724   LDLCALC UNABLE TO CALCULATE IF TRIGLYCERIDE OVER 400 mg/dL 36/64/4034   LDLCALC 82 06/17/2015   Psychiatric Specialty Exam: Physical Exam  Constitutional: She is oriented to person, place, and time. She appears well-developed and well-nourished. No distress.  HENT:  Head: Normocephalic and atraumatic.  Eyes: EOM are normal.  Neck: Normal range of motion.  Cardiovascular: Normal rate and regular rhythm.  Respiratory: Effort normal. No respiratory distress.  Musculoskeletal: Normal range of motion.  Neurological: She is alert and oriented to person, place, and time.    Review of Systems  Constitutional: Negative.   Respiratory: Negative.   Cardiovascular: Negative.  Gastrointestinal: Negative.   Genitourinary: Positive for frequency.       Incontinence  Musculoskeletal: Positive for neck pain.  Neurological: Negative.   Psychiatric/Behavioral: Negative for depression, hallucinations, memory loss, substance abuse and suicidal ideas. The patient is not nervous/anxious and does not have insomnia.     Blood pressure 104/84, pulse 74,  temperature 98.1 F (36.7 C), temperature source Oral, resp. rate 18, last menstrual period 05/28/2018, SpO2 98 %.There is no height or weight on file to calculate BMI.  General Appearance: Casual  Eye Contact:  Good  Speech:  Clear and Coherent and Normal Rate  Volume:  Normal  Mood:  Euthymic  Affect:  Congruent and Constricted  Thought Process:  Goal Directed  Orientation:  Full (Time, Place, and Person)  Thought Content:  Logical, Hallucinations: None and Rumination  Suicidal Thoughts:  No  Homicidal Thoughts:  No  Memory:  Recent;   Fair Remote;   Fair  Judgement:  Fair  Insight:  Fair  Psychomotor Activity:  Normal  Concentration:  Concentration: Fair and Attention Span: Fair  Recall:  [employer] of Knowledge:  Fair  Language:  Good  Akathisia:  No  Handed:  Right  AIMS (if indicated):     Assets:  Desire for Improvement Financial Resources/Insurance  ADL's:  Intact  Cognition:  WNL  Sleep:   adequate    Treatment Plan Summary:  MsShumateis a 53 y.o.femalewith a past psych h/o Schizoaffective disorder, borderline personality disorder, who was brought to ED from group home due to suicidal ideations. The patient iswell known to theED and oftenpresents with similar presentation -SI and HI in settings of not liking her group home.  Today patient appears to be without acute changes. She complains of neck soreness, and will be administered ibuprofen 800 mg every 8 hours as needed for pain, fever.  There are no indications for psych medication changes at this time.Current psych medications: Risperdal  QHS, Invega Sustenna 156 mg IM once on 08/28/18; Klonopin1mg  PO BID, Luvox  PO QHS, Benztropine  PO BID.  Patient is currently staying in emergency department to receive assistance from social work on housing. Spoke with TTS, placement is still pending.  Will assess the patient while she is in ER.   Sherri Craft, MD 09/09/2018, 9:48 AM

## 2018-09-09 NOTE — ED Notes (Signed)
Report to include Situation, Background, Assessment, and Recommendations received from Amy RN. Patient alert and oriented, warm and dry, in no acute distress. Patient denies SI, HI, AVH and pain. Patient made aware of Q15 minute rounds and Rover and Officer presence for their safety. Patient instructed to come to me with needs or concerns.   

## 2018-09-10 DIAGNOSIS — F70 Mild intellectual disabilities: Secondary | ICD-10-CM | POA: Diagnosis not present

## 2018-09-10 DIAGNOSIS — G809 Cerebral palsy, unspecified: Secondary | ICD-10-CM | POA: Diagnosis not present

## 2018-09-10 DIAGNOSIS — F250 Schizoaffective disorder, bipolar type: Secondary | ICD-10-CM | POA: Diagnosis not present

## 2018-09-10 DIAGNOSIS — R4189 Other symptoms and signs involving cognitive functions and awareness: Secondary | ICD-10-CM | POA: Diagnosis not present

## 2018-09-10 DIAGNOSIS — F4325 Adjustment disorder with mixed disturbance of emotions and conduct: Secondary | ICD-10-CM | POA: Diagnosis not present

## 2018-09-10 NOTE — Evaluation (Signed)
Physical Therapy Evaluation Patient Details Name: [name] MRN: [id number] DOB: July 04, 1965 Today's Date: 09/10/2018   History of Present Illness  presented to ER 4/24 secondary to back pain, suicidal ideation in the setting of not liking her group home/not wanting to go to homeless shelter (per notes).  Psych following; pending discharge to new group home when cleared.  Clinical Impression  Upon evaluation, patient alert and oriented to basic information; follows simple commands, calm and cooperative throughout session.  Bilat UE/LE strength generally deconditioned, but functional for basic transfers and gait.  Demonstrates ability to complete bed mobility with mod indep; sit/stand, basic transfers and gait (150') with RW, cga/min assist.  mod weakness to L posterior/lateral hip weakness (with pelvic on femur abduct in loading), decreased step height/clearance R LE.  Significantly delayed cadence.  Cadence/mechanics improved/normalized with facilitation of increased cadence/automatic stepping (therapist manually advancing RW).  Do recommend use of RW for optimal safety/stability with future mobility efforts. Would benefit from skilled PT to address above deficits and promote optimal return to PLOF; Recommend transition to HHPT upon discharge from acute hospitalization.     Follow Up Recommendations Home health PT    Equipment Recommendations  Rolling walker with 5" wheels    Recommendations for Other Services       Precautions / Restrictions Precautions Precautions: Fall Restrictions Weight Bearing Restrictions: No      Mobility  Bed Mobility Overal bed mobility: Modified Independent                Transfers Overall transfer level: Needs assistance Equipment used: Rolling walker (2 wheeled) Transfers: Sit to/from Stand Sit to Stand: Min guard         General transfer comment: tends to pull on RW despite cuing from therapist  Ambulation/Gait Ambulation/Gait  assistance: Min guard Gait Distance (Feet): 150 Feet Assistive device: Rolling walker (2 wheeled)       General Gait Details: mod weakness to L posterior/lateral hip weakness (with pelvic on femur abduct in loading), decreased step height/clearance R LE.  Significantly delayed cadence.  Cadence/mechanics improved/normalized with facilitation of increased cadence/automatic stepping (therapist manually advancing RW).  Do recommend use of RW for optimal safety/stability with future mobility efforts.  Stairs            Wheelchair Mobility    Modified Rankin (Stroke Patients Only)       Balance Overall balance assessment: Needs assistance Sitting-balance support: No upper extremity supported;Feet supported Sitting balance-Leahy Scale: Good     Standing balance support: Bilateral upper extremity supported Standing balance-Leahy Scale: Fair                               Pertinent Vitals/Pain Pain Assessment: No/denies pain    Home Living Family/patient expects to be discharged to:: Group home                 Additional Comments: Planning to discharge to new group home; unaware of environmental set up    Prior Function Level of Independence: Independent with assistive device(s)         Comments: Mod indep with SPC for ADLs, household and community mobilization     Hand Dominance        Extremity/Trunk Assessment   Upper Extremity Assessment Upper Extremity Assessment: Overall WFL for tasks assessed    Lower Extremity Assessment Lower Extremity Assessment: Generalized weakness(grossly 4-/5 throughout )       Communication  Communication: No difficulties  Cognition Arousal/Alertness: Awake/alert Behavior During Therapy: WFL for tasks assessed/performed Overall Cognitive Status: No family/caregiver present to determine baseline cognitive functioning                                        General Comments       Exercises     Assessment/Plan    PT Assessment Patient needs continued PT services  PT Problem List Decreased strength;Decreased activity tolerance;Decreased balance;Decreased mobility;Decreased safety awareness;Decreased knowledge of use of DME;Decreased knowledge of precautions       PT Treatment Interventions DME instruction;Gait training;Functional mobility training;Therapeutic activities;Therapeutic exercise;Balance training;Cognitive remediation;Patient/family education    PT Goals (Current goals can be found in the Care Plan section)  Acute Rehab PT Goals Patient Stated Goal: to go to new group home PT Goal Formulation: With patient Time For Goal Achievement: 09/24/18 Potential to Achieve Goals: Fair    Frequency Min 2X/week   Barriers to discharge        Co-evaluation               AM-PAC PT "6 Clicks" Mobility  Outcome Measure Help needed turning from your back to your side while in a flat bed without using bedrails?: None Help needed moving from lying on your back to sitting on the side of a flat bed without using bedrails?: None Help needed moving to and from a bed to a chair (including a wheelchair)?: None Help needed standing up from a chair using your arms (e.g., wheelchair or bedside chair)?: A Little Help needed to walk in hospital room?: A Little Help needed climbing 3-5 steps with a railing? : A Little 6 Click Score: 21    End of Session Equipment Utilized During Treatment: Gait belt Activity Tolerance: Patient tolerated treatment well Patient left: in bed Nurse Communication: Mobility status PT Visit Diagnosis: Muscle weakness (generalized) (M62.81);Difficulty in walking, not elsewhere classified (R26.2)    Time: 4239-5320 PT Time Calculation (min) (ACUTE ONLY): 13 min   Charges:   PT Evaluation $PT Eval Moderate Complexity: 1 Mod          Rossie Scarfone H. Manson Passey, PT, DPT, NCS 09/10/18, 2:57 PM [phone]

## 2018-09-10 NOTE — ED Notes (Signed)
Hourly rounding reveals patient in room. No complaints, stable, in no acute distress. Q15 minute rounds and monitoring via Rover and Officer to continue.   

## 2018-09-10 NOTE — ED Notes (Signed)
Vol/Pending placement  

## 2018-09-10 NOTE — ED Notes (Signed)
Pt wanting this Clinical research associate to dress her after an episode of incontinence.  Writer explained she needs to perform her own self care in order to be successful at the group home. Dr. Viviano Simas made aware. Maintained on 15 minute checks.

## 2018-09-10 NOTE — ED Provider Notes (Signed)
-----------------------------------------   3:37 AM on 09/10/2018 -----------------------------------------   Blood pressure 104/84, pulse 74, temperature 98.1 F (36.7 C), temperature source Oral, resp. rate 18, last menstrual period 05/28/2018, SpO2 98 %.  The patient is calm and cooperative at this time.  There have been no acute events since the last update.  Awaiting disposition plan from Behavioral Medicine team. Patient's UTI is under treatment with Keflex which it is sensitive to.   Arnaldo Natal, MD 09/10/18 [phone]

## 2018-09-10 NOTE — Progress Notes (Signed)
[facility]  09/10/2018 9:36 AM [name]  MRN:  [id number]  Principal Problem: Adjustment disorder with mixed disturbance of emotions and conduct Diagnosis: Principal Problem:   Adjustment disorder with mixed disturbance of emotions and conduct Active Problems:   Cerebral palsy (HCC)   Mild intellectual disability   GERD (gastroesophageal reflux disease)   COPD (chronic obstructive pulmonary disease) (HCC)   Schizoaffective disorder, bipolar type (HCC)   Suicidal ideation  Total Time spent with patient: 15 minutes  Patient seen today for psych f/u. Chart reviewed. Patient is calm, cooperative. watching TV.  HPI: On admission 08/08/18:  Sherri BradfordKimberly is a 53 year old Caucasian female with a history ofschizoaffective disorder, intellectual disability and borderline personality disorder. She was seen in emergency department due to suicidal ideations, but once evaluated, she denied having them,and therefore she was discharged. Later on the same day, she came back expressing suicidal thoughts again, and racing thoughts.    SUBJECTIVE: "I need help getting out of the bed."  On evaluation, patient is sedated, calm and cooperative.  She continues to have somatic complaints of neck and leg pain.  She requests assistance to get out of the bed, but then gets up on her own while nursing staff is retrieving a walker for her. Patient is encouraged to maintain her independence in order that she can successfully be transitioned to a group home.  Patient is denying suicidal and homicidal ideation.  She is denying hallucinations today.  She reports fair sleep.  She reports good appetite.  She is hopeful for discharge soon to her new group home, and is beginning to worry that maybe they want to take her.  Med WU:[id number]:asthma, cerebral palsy, COPD.  Past psych Hx:  Dx: Schizoaffective d/o, BPD. Multiple past psych admissions.Hash/o past suicide attempts.  Current psych  medications: Risperdal 1 mg with breakfast and lunch, and 2mg  QHS, Invega Sustenna 156 mg IM once on 08/28/18 with repeat dose every 28 days; Klonopin1mg  PO BID, Luvox 150mg  PO QHS, Benztropine 2mg  PO BID.  Social History:Patient is single. She livedin group home, and is awaiting placement to new group home. She is on disability. She has no children. She reports no access to guns.  Substance use: denies.   Past Medical History:  Past Medical History:  Diagnosis Date  . Anxiety   . Asthma   . Borderline personality disorder (HCC)   . Cerebral palsy (HCC)   . COPD (chronic obstructive pulmonary disease) (HCC)   . Depression   . GERD (gastroesophageal reflux disease)   . Mild cognitive impairment   . Schizoaffective disorder (HCC)   . Wound abscess     Past Surgical History:  Procedure Laterality Date  . CHOLECYSTECTOMY    . INCISION AND DRAINAGE ABSCESS Right 01/02/2015   Procedure: INCISION AND DRAINAGE ABSCESS;  Surgeon: Tiney Rougealph Ely III, MD;  Location: [facility] ORS;  Service: General;  Laterality: Right;  . TONSILLECTOMY     Family History:  Family History  Problem Relation Age of Onset  . Heart attack Father   . Diabetes Mother     Social History:  Social History   Substance and Sexual Activity  Alcohol Use No     Social History   Substance and Sexual Activity  Drug Use No    Social History   Socioeconomic History  . Marital status: Single    Spouse name: Not on file  . Number of children: Not on file  . Years of education: Not on file  .  Highest education level: Not on file  Occupational History  . Occupation: Disabled  Social Needs  . Financial resource strain: Not on file  . Food insecurity:    Worry: Not on file    Inability: Not on file  . Transportation needs:    Medical: Not on file    Non-medical: Not on file  Tobacco Use  . Smoking status: Former Games developer  . Smokeless tobacco: Never Used  Substance and Sexual Activity  . Alcohol use: No  .  Drug use: No  . Sexual activity: Not Currently  Lifestyle  . Physical activity:    Days per week: Not on file    Minutes per session: Not on file  . Stress: Not on file  Relationships  . Social connections:    Talks on phone: Not on file    Gets together: Not on file    Attends religious service: Not on file    Active member of club or organization: Not on file    Attends meetings of clubs or organizations: Not on file    Relationship status: Not on file  Other Topics Concern  . Not on file  Social History Narrative   Pt lives with mother, nephew, sister in law    Sleep: Fair  Appetite:  Good  Current Medications: Current Facility-Administered Medications  Medication Dose Route Frequency Provider Last Rate Last Dose  . acetaminophen (TYLENOL) tablet 650 mg  650 mg Oral Once Jeanmarie Plant, MD      . acetaminophen (TYLENOL) tablet 650 mg  650 mg Oral Q6H PRN Dionne Bucy, MD   650 mg at 09/08/18 1408  . acetaminophen (TYLENOL) tablet 650 mg  650 mg Oral Once Sharman Cheek, MD   Stopped at 08/23/18 1928  . benztropine (COGENTIN) tablet 2 mg  2 mg Oral BID Reggie Pile, MD   2 mg at 09/09/18 2117  . cephALEXin (KEFLEX) capsule 500 mg  500 mg Oral Q8H Arnaldo Natal, MD   500 mg at 09/09/18 2117  . clonazePAM (KLONOPIN) tablet 1 mg  1 mg Oral BID Reggie Pile, MD   1 mg at 09/09/18 2117  . cyclobenzaprine (FLEXERIL) tablet 7.5 mg  7.5 mg Oral TID PRN Mariel Craft, MD   7.5 mg at 09/09/18 2118  . diphenhydrAMINE (BENADRYL) capsule 50 mg  50 mg Oral Q8H PRN Mariel Craft, MD   50 mg at 09/09/18 2117   Or  . diphenhydrAMINE (BENADRYL) injection 50 mg  50 mg Intramuscular Q8H PRN Mariel Craft, MD   50 mg at 08/24/18 2357  . famotidine (PEPCID) tablet 20 mg  20 mg Oral BID Reggie Pile, MD   20 mg at 09/09/18 2117  . fenofibrate tablet 160 mg  160 mg Oral Daily Reggie Pile, MD   160 mg at 09/09/18 1010  . fluvoxaMINE (LUVOX) tablet 150 mg  150 mg Oral QHS  Mariel Craft, MD   150 mg at 09/09/18 2117  . ibuprofen (ADVIL) tablet 800 mg  800 mg Oral Q8H PRN Mariel Craft, MD      . levothyroxine (SYNTHROID) tablet 50 mcg  50 mcg Oral Q0600 Reggie Pile, MD   50 mcg at 09/09/18 1012  . LORazepam (ATIVAN) tablet 1 mg  1 mg Oral BID PRN Mariel Craft, MD   1 mg at 09/08/18 1616   Or  . LORazepam (ATIVAN) injection 2 mg  2 mg Intramuscular BID PRN Mariel Craft, MD      .  nitrofurantoin (MACRODANTIN) capsule 100 mg  100 mg Oral Q2000 Mariel Craft, MD   100 mg at 09/09/18 2119  . nystatin (MYCOSTATIN/NYSTOP) topical powder   Topical BID Emily Filbert, MD      . paliperidone [facility] SUSTENNA) injection 156 mg  156 mg Intramuscular Q28 days Mariel Craft, MD   156 mg at 08/28/18 0509  . pantoprazole (PROTONIX) EC tablet 40 mg  40 mg Oral Daily Reggie Pile, MD   40 mg at 09/09/18 1010  . risperiDONE (RISPERDAL) tablet 1 mg  1 mg Oral BID WC Mariel Craft, MD   1 mg at 09/09/18 1734  . risperiDONE (RISPERDAL) tablet 2 mg  2 mg Oral QHS Mariel Craft, MD   2 mg at 09/09/18 2117  . sucralfate (CARAFATE) tablet 1 g  1 g Oral TID WC & HS Reggie Pile, MD   1 g at 09/09/18 2120   Current Outpatient Medications  Medication Sig Dispense Refill  . ARIPiprazole (ABILIFY) 20 MG tablet Take 1 tablet (20 mg total) by mouth daily. 30 tablet 0  . benztropine (COGENTIN) 2 MG tablet Take 1 tablet (2 mg total) by mouth 2 (two) times daily. 60 tablet 0  . clonazePAM (KLONOPIN) 1 MG tablet Take 1 tablet (1 mg total) by mouth 2 (two) times daily. 30 tablet 0  . divalproex (DEPAKOTE ER) 500 MG 24 hr tablet Take 2 tablets (1,000 mg total) by mouth at bedtime. 60 tablet 0  . famotidine (PEPCID) 20 MG tablet Take 1 tablet (20 mg total) by mouth 2 (two) times daily. 60 tablet 0  . fenofibrate 160 MG tablet Take 1 tablet (160 mg total) by mouth daily. 30 tablet 1  . fluvoxaMINE (LUVOX) 100 MG tablet Take 1 tablet (100 mg total) by mouth at bedtime. 30  tablet 0  . levothyroxine (SYNTHROID) 50 MCG tablet Take 1 tablet (50 mcg total) by mouth daily before breakfast. 30 tablet 1  . nitrofurantoin (MACRODANTIN) 100 MG capsule Take 1 capsule (100 mg total) by mouth 2 (two) times daily. 60 capsule 0  . OLANZapine (ZYPREXA) 5 MG tablet Take 1 tablet (5 mg total) by mouth every 8 (eight) hours as needed (agitation). 30 tablet 0  . pantoprazole (PROTONIX) 40 MG tablet Take 1 tablet (40 mg total) by mouth daily. 30 tablet 1  . sucralfate (CARAFATE) 1 g tablet Take 1 tablet (1 g total) by mouth 4 (four) times daily -  with meals and at bedtime. 120 tablet 1    Lab Results: No results found for this or any previous visit (from the past 48 hour(s)).  Blood Alcohol level:  Lab Results  Component Value Date   ETH <10 08/09/2018   ETH <10 08/01/2018    Metabolic Disorder Labs: Lab Results  Component Value Date   HGBA1C 4.8 08/11/2016   MPG 91 08/11/2016   Lab Results  Component Value Date   PROLACTIN 111.7 (H) 08/11/2016   PROLACTIN 66.8 (H) 06/17/2015   Lab Results  Component Value Date   CHOL 178 08/11/2016   TRIG 402 (H) 08/11/2016   HDL 13 (L) 08/11/2016   CHOLHDL 13.7 08/11/2016   VLDL UNABLE TO CALCULATE IF TRIGLYCERIDE OVER 400 mg/dL 40/98/1191   LDLCALC UNABLE TO CALCULATE IF TRIGLYCERIDE OVER 400 mg/dL 47/82/9562   LDLCALC 82 06/17/2015   Psychiatric Specialty Exam: Physical Exam  Constitutional: She is oriented to person, place, and time. She appears well-developed and well-nourished. No distress.  HENT:  Head: Normocephalic and atraumatic.  Eyes: EOM are normal.  Neck: Normal range of motion.  Cardiovascular: Normal rate and regular rhythm.  Respiratory: Effort normal. No respiratory distress.  Musculoskeletal: Normal range of motion.  Neurological: She is alert and oriented to person, place, and time.    Review of Systems  Constitutional: Negative.   Respiratory: Negative.   Cardiovascular: Negative.    Gastrointestinal: Negative.   Genitourinary: Positive for frequency.       Incontinence  Musculoskeletal: Positive for joint pain and neck pain.  Neurological: Negative.   Psychiatric/Behavioral: Negative for depression, hallucinations, memory loss, substance abuse and suicidal ideas. The patient is not nervous/anxious and does not have insomnia.     Blood pressure 104/84, pulse 74, temperature 98.1 F (36.7 C), temperature source Oral, resp. rate 18, last menstrual period 05/28/2018, SpO2 98 %.There is no height or weight on file to calculate BMI.  General Appearance: Casual  Eye Contact:  Good  Speech:  Clear and Coherent and Normal Rate  Volume:  Normal  Mood:  Euthymic  Affect:  Congruent and Constricted  Thought Process:  Goal Directed  Orientation:  Full (Time, Place, and Person)  Thought Content:  Logical, Hallucinations: None and Rumination  Suicidal Thoughts:  No  Homicidal Thoughts:  No  Memory:  Recent;   Fair Remote;   Fair  Judgement:  Fair  Insight:  Fair  Psychomotor Activity:  Normal  Concentration:  Concentration: Fair and Attention Span: Fair  Recall:  [employer] of Knowledge:  Fair  Language:  Good  Akathisia:  No  Handed:  Right  AIMS (if indicated):     Assets:  Desire for Improvement Financial Resources/Insurance  ADL's:  Intact  Cognition:  WNL  Sleep:   adequate    Treatment Plan Summary:  MsShumateis a 53 y.o.femalewith a past psych h/o Schizoaffective disorder, borderline personality disorder, who was brought to ED from group home due to suicidal ideations. The patient iswell known to theED and oftenpresents with similar presentation -SI and HI in settings of not liking her group home.  Today patient appears to be without acute changes. She complains of neck soreness and leg pain.  She has increased somatic complaints and request for help. Patient has ibuprofen 800 mg every 8 hours as needed for pain, fever, and Flexeril 7.5 mg 3 times  daily for muscle spasm. PT consult is ordered to ensure that patient can be independent for discharge to group home facility.  There are no indications for psych medication changes at this time.Current psych medications: Risperdal  QHS, Invega Sustenna 156 mg IM once on 08/28/18; Klonopin1mg  PO BID, Luvox  PO QHS, Benztropine  PO BID. Patient has as needed medication for anxiety, which appears to be increasing as she awaits her group home placement.  Patient is currently staying in emergency department to receive assistance from social work on housing. Spoke with TTS, placement is still pending.  Will continue to assess the patient while she is in ER, and make changes as indicated.   Mariel Craft, MD 09/10/2018, 9:36 AM

## 2018-09-10 NOTE — ED Notes (Signed)
Pt up walking with PT at this time.

## 2018-09-11 DIAGNOSIS — F250 Schizoaffective disorder, bipolar type: Secondary | ICD-10-CM | POA: Diagnosis not present

## 2018-09-11 DIAGNOSIS — F70 Mild intellectual disabilities: Secondary | ICD-10-CM | POA: Diagnosis not present

## 2018-09-11 DIAGNOSIS — F4325 Adjustment disorder with mixed disturbance of emotions and conduct: Secondary | ICD-10-CM | POA: Diagnosis not present

## 2018-09-11 DIAGNOSIS — R4189 Other symptoms and signs involving cognitive functions and awareness: Secondary | ICD-10-CM | POA: Diagnosis not present

## 2018-09-11 DIAGNOSIS — G809 Cerebral palsy, unspecified: Secondary | ICD-10-CM | POA: Diagnosis not present

## 2018-09-11 MED ORDER — DOCUSATE SODIUM 100 MG PO CAPS
100.0000 mg | ORAL_CAPSULE | Freq: Every day | ORAL | Status: DC
  Administered 2018-09-11 – 2018-09-16 (×6): 100 mg via ORAL
  Filled 2018-09-11 (×6): qty 1

## 2018-09-11 NOTE — ED Notes (Signed)
BEHAVIORAL HEALTH ROUNDING Patient sleeping: No. Patient alert and oriented: yes Behavior appropriate: Yes.  ; If no, describe:  Nutrition and fluids offered: yes Toileting and hygiene offered: Yes  Sitter present: q15 minute observations and security monitoring Law enforcement present: Yes    

## 2018-09-11 NOTE — ED Notes (Signed)
ED BHU PLACEMENT JUSTIFICATION Is the patient under IVC or is there intent for IVC:   voluntary Is the patient medically cleared: Yes.   Is there vacancy in the ED BHU: Yes.   Is the population mix appropriate for patient: Yes.   Is the patient awaiting placement in inpatient or outpatient setting: Yes  Awaiting group home placement .   Has the patient had a psychiatric consult: Yes.   Survey of unit performed for contraband, proper placement and condition of furniture, tampering with fixtures in bathroom, shower, and each patient room: Yes.  ; Findings:  APPEARANCE/BEHAVIOR Calm and cooperative NEURO ASSESSMENT Orientation: oriented x3  Denies pain Hallucinations: No.None noted (Hallucinations)  denies Speech: Normal Gait: normal RESPIRATORY ASSESSMENT Even  Unlabored respirations  CARDIOVASCULAR ASSESSMENT Pulses equal   regular rate  Skin warm and dry   GASTROINTESTINAL ASSESSMENT no GI complaint EXTREMITIES Full ROM  PLAN OF CARE Provide calm/safe environment. Vital signs assessed twice daily. ED BHU Assessment once each 12-hour shift. Collaborate with TTS daily or as condition indicates. Assure the ED provider has rounded once each shift. Provide and encourage hygiene. Provide redirection as needed. Assess for escalating behavior; address immediately and inform ED provider.  Assess family dynamic and appropriateness for visitation as needed: Yes.  ; If necessary, describe findings:  Educate the patient/family about BHU procedures/visitation: Yes.  ; If necessary, describe findings:

## 2018-09-11 NOTE — ED Notes (Signed)

## 2018-09-11 NOTE — ED Provider Notes (Signed)
-----------------------------------------   6:12 AM on 09/11/2018 -----------------------------------------   Blood pressure 104/84, pulse 74, temperature 98.1 F (36.7 C), temperature source Oral, resp. rate 18, last menstrual period 05/28/2018, SpO2 98 %.  The patient is calm and cooperative at this time.  There have been no acute events since the last update.  Awaiting disposition plan from Behavioral Medicine team.   Dionne Bucy, MD 09/11/18 [phone]

## 2018-09-11 NOTE — ED Notes (Signed)
Pt given meal tray.

## 2018-09-11 NOTE — ED Notes (Addendum)
Pt transferred into ED BHU room 3  Patient assigned to appropriate care area. Patient oriented to unit/care area: Informed that, for her safety, care areas are designed for safety and monitored by security cameras at all times;  phone times explained to patient. Patient verbalizes understanding, and verbal contract for safety obtained.   Assessment completed

## 2018-09-11 NOTE — ED Notes (Signed)
Pt urinated on self. Pt assisted to bathroom by Maralyn Sago, NT. Pt clothes changed and bed linens changed by this RN.

## 2018-09-11 NOTE — ED Notes (Signed)
BEHAVIORAL HEALTH ROUNDING Patient sleeping: Yes.   Patient alert and oriented: eyes closed  Appears asleep Behavior appropriate: Yes.  ; If no, describe:  Nutrition and fluids offered: Yes  Toileting and hygiene offered: sleeping Sitter present: q 15 minute observations and security camera monitoring Law enforcement present: yes  ODS 

## 2018-09-11 NOTE — ED Notes (Addendum)
Patient talking with psychiatry Dr. Viviano Simas and Dr. Viviano Simas called [name] patients care worker and left a voicemail. Patient is becoming anxious and frustrated about being here, she is ready to be transferred.

## 2018-09-11 NOTE — Progress Notes (Signed)
[facility] MD Progress Note  09/11/2018 9:51 AM Charman Blasco  MRN:  [id number]  Principal Problem: Adjustment disorder with mixed disturbance of emotions and conduct Diagnosis: Principal Problem:   Adjustment disorder with mixed disturbance of emotions and conduct Active Problems:   Cerebral palsy (HCC)   Mild intellectual disability   GERD (gastroesophageal reflux disease)   COPD (chronic obstructive pulmonary disease) (HCC)   Schizoaffective disorder, bipolar type (HCC)   Suicidal ideation  Patient is seen, chart is reviewed, 25 minutes spent in coronation of care with conference calls to caseworker, and group home. Total Time spent with patient: 45 minutes  Patient seen today for psych f/u. Chart reviewed. Patient is calm, cooperative. watching TV.  HPI: On admission 08/08/18:  [name] is a 53 year old Caucasian female with a history ofschizoaffective disorder, intellectual disability and borderline personality disorder. She was seen in emergency department due to suicidal ideations, but once evaluated, she denied having them,and therefore she was discharged. Later on the same day, she came back expressing suicidal thoughts again, and racing thoughts.    SUBJECTIVE: "I am ready to go."  On evaluation, patient is calm and cooperative.  She is denying somatic complaints today.  Patient is denying suicidal and homicidal ideation.  She is denying hallucinations today.  She reports adequate sleep.  She reports good appetite.  She is hopeful for discharge soon to her new group home, and displays good frustration tolerance that we do not have an answer today.  Contact is made to patient's caseworker, [name], with patient present.  States, all "aw shit" When she is not able to be given a date for moving, however she smiles and says she understands.  Spoke with Lola Aridegbe [phone]) owner of group home where patient will be living. According to Ms. Lola paperwork is complete, they  will need Karlynn to sign an intake form and have that faxed back to them.  Group home is made aware that patient had assessment with physical therapy who is recommending a minimum of twice weekly physical therapy to be done in home.  Group home is also requesting assistance in arranging patient follow-up psychiatric care with [facility].  Contacted [facility] social work Haematologist who will follow-up on arrangements in the morning.   Med QM:VHQION, cerebral palsy, COPD.  Past psych Hx:  Dx: Schizoaffective d/o, BPD. Multiple past psych admissions.Hash/o past suicide attempts.  Current psych medications: Risperdal 1 mg with breakfast and lunch, and  QHS, Invega Sustenna 156 mg IM once on 08/28/18 with repeat dose every 28 days; Klonopin1mg  PO BID, Luvox  PO QHS, Benztropine  PO BID.  Social History:Patient is single. She livedin group home, and is awaiting placement to new group home. She is on disability. She has no children. She reports no access to guns.  Substance use: denies.   Past Medical History:  Past Medical History:  Diagnosis Date  . Anxiety   . Asthma   . Borderline personality disorder (HCC)   . Cerebral palsy (HCC)   . COPD (chronic obstructive pulmonary disease) (HCC)   . Depression   . GERD (gastroesophageal reflux disease)   . Mild cognitive impairment   . Schizoaffective disorder (HCC)   . Wound abscess     Past Surgical History:  Procedure Laterality Date  . CHOLECYSTECTOMY    . INCISION AND DRAINAGE ABSCESS Right 01/02/2015   Procedure: INCISION AND DRAINAGE ABSCESS;  Surgeon: Tiney Rouge III, MD;  Location: [facility] ORS;  Service: General;  Laterality: Right;  .  TONSILLECTOMY     Family History:  Family History  Problem Relation Age of Onset  . Heart attack Father   . Diabetes Mother     Social History:  Social History   Substance and Sexual Activity  Alcohol Use No     Social History   Substance and Sexual Activity  Drug Use No    Social  History   Socioeconomic History  . Marital status: Single    Spouse name: Not on file  . Number of children: Not on file  . Years of education: Not on file  . Highest education level: Not on file  Occupational History  . Occupation: Disabled  Social Needs  . Financial resource strain: Not on file  . Food insecurity:    Worry: Not on file    Inability: Not on file  . Transportation needs:    Medical: Not on file    Non-medical: Not on file  Tobacco Use  . Smoking status: Former Games developermoker  . Smokeless tobacco: Never Used  Substance and Sexual Activity  . Alcohol use: No  . Drug use: No  . Sexual activity: Not Currently  Lifestyle  . Physical activity:    Days per week: Not on file    Minutes per session: Not on file  . Stress: Not on file  Relationships  . Social connections:    Talks on phone: Not on file    Gets together: Not on file    Attends religious service: Not on file    Active member of club or organization: Not on file    Attends meetings of clubs or organizations: Not on file    Relationship status: Not on file  Other Topics Concern  . Not on file  Social History Narrative   Pt lives with mother, nephew, sister in law    Sleep: Fair  Appetite:  Good  Current Medications: Current Facility-Administered Medications  Medication Dose Route Frequency Provider Last Rate Last Dose  . acetaminophen (TYLENOL) tablet 650 mg  650 mg Oral Once Jeanmarie PlantMcShane, James A, MD      . acetaminophen (TYLENOL) tablet 650 mg  650 mg Oral Q6H PRN Dionne BucySiadecki, Sebastian, MD   650 mg at 09/08/18 1408  . acetaminophen (TYLENOL) tablet 650 mg  650 mg Oral Once Sharman CheekStafford, Phillip, MD   Stopped at 08/23/18 1928  . benztropine (COGENTIN) tablet 2 mg  2 mg Oral BID Reggie PileJoshi, Anand, MD   Stopped at 09/10/18 2215  . cephALEXin (KEFLEX) capsule 500 mg  500 mg Oral Q8H Arnaldo NatalMalinda, Paul F, MD   500 mg at 09/11/18 [id number]  . clonazePAM (KLONOPIN) tablet 1 mg  1 mg Oral BID Reggie PileJoshi, Anand, MD   Stopped at 09/10/18  2215  . cyclobenzaprine (FLEXERIL) tablet 7.5 mg  7.5 mg Oral TID PRN Mariel CraftMaurer, Mccrae Speciale M, MD   7.5 mg at 09/09/18 2118  . diphenhydrAMINE (BENADRYL) capsule 50 mg  50 mg Oral Q8H PRN Mariel CraftMaurer, Trea Carnegie M, MD   50 mg at 09/09/18 2117   Or  . diphenhydrAMINE (BENADRYL) injection 50 mg  50 mg Intramuscular Q8H PRN Mariel CraftMaurer, Asuncion Shibata M, MD   50 mg at 08/24/18 2357  . famotidine (PEPCID) tablet 20 mg  20 mg Oral BID Reggie PileJoshi, Anand, MD   Stopped at 09/10/18 2215  . fenofibrate tablet 160 mg  160 mg Oral Daily Reggie PileJoshi, Anand, MD   160 mg at 09/10/18 1021  . fluvoxaMINE (LUVOX) tablet 150 mg  150 mg Oral QHS Viviano SimasMaurer,  Orlean Bradford, MD   Stopped at 09/10/18 2215  . ibuprofen (ADVIL) tablet 800 mg  800 mg Oral Q8H PRN Mariel Craft, MD      . levothyroxine (SYNTHROID) tablet 50 mcg  50 mcg Oral Q0600 Reggie Pile, MD   50 mcg at 09/11/18 [phone]  . LORazepam (ATIVAN) tablet 1 mg  1 mg Oral BID PRN Mariel Craft, MD   1 mg at 09/08/18 1616   Or  . LORazepam (ATIVAN) injection 2 mg  2 mg Intramuscular BID PRN Mariel Craft, MD      . nitrofurantoin (MACRODANTIN) capsule 100 mg  100 mg Oral Q2000 Mariel Craft, MD   Stopped at 09/10/18 2216  . nystatin (MYCOSTATIN/NYSTOP) topical powder   Topical BID Emily Filbert, MD   Stopped at 09/10/18 2216  . paliperidone (INVEGA SUSTENNA) injection 156 mg  156 mg Intramuscular Q28 days Mariel Craft, MD   156 mg at 08/28/18 0509  . pantoprazole (PROTONIX) EC tablet 40 mg  40 mg Oral Daily Reggie Pile, MD   40 mg at 09/10/18 0950  . risperiDONE (RISPERDAL) tablet 1 mg  1 mg Oral BID WC Mariel Craft, MD   1 mg at 09/10/18 1613  . risperiDONE (RISPERDAL) tablet 2 mg  2 mg Oral QHS Mariel Craft, MD   2 mg at 09/10/18 2215  . sucralfate (CARAFATE) tablet 1 g  1 g Oral TID WC & HS Reggie Pile, MD   1 g at 09/10/18 2215   Current Outpatient Medications  Medication Sig Dispense Refill  . ARIPiprazole (ABILIFY) 20 MG tablet Take 1 tablet (20 mg total) by mouth daily.  30 tablet 0  . benztropine (COGENTIN) 2 MG tablet Take 1 tablet (2 mg total) by mouth 2 (two) times daily. 60 tablet 0  . clonazePAM (KLONOPIN) 1 MG tablet Take 1 tablet (1 mg total) by mouth 2 (two) times daily. 30 tablet 0  . divalproex (DEPAKOTE ER) 500 MG 24 hr tablet Take 2 tablets (1,000 mg total) by mouth at bedtime. 60 tablet 0  . famotidine (PEPCID) 20 MG tablet Take 1 tablet (20 mg total) by mouth 2 (two) times daily. 60 tablet 0  . fenofibrate 160 MG tablet Take 1 tablet (160 mg total) by mouth daily. 30 tablet 1  . fluvoxaMINE (LUVOX) 100 MG tablet Take 1 tablet (100 mg total) by mouth at bedtime. 30 tablet 0  . levothyroxine (SYNTHROID) 50 MCG tablet Take 1 tablet (50 mcg total) by mouth daily before breakfast. 30 tablet 1  . nitrofurantoin (MACRODANTIN) 100 MG capsule Take 1 capsule (100 mg total) by mouth 2 (two) times daily. 60 capsule 0  . OLANZapine (ZYPREXA) 5 MG tablet Take 1 tablet (5 mg total) by mouth every 8 (eight) hours as needed (agitation). 30 tablet 0  . pantoprazole (PROTONIX) 40 MG tablet Take 1 tablet (40 mg total) by mouth daily. 30 tablet 1  . sucralfate (CARAFATE) 1 g tablet Take 1 tablet (1 g total) by mouth 4 (four) times daily -  with meals and at bedtime. 120 tablet 1    Lab Results: No results found for this or any previous visit (from the past 48 hour(s)).  Blood Alcohol level:  Lab Results  Component Value Date   [facility] <10 08/09/2018   ETH <10 08/01/2018    Metabolic Disorder Labs: Lab Results  Component Value Date   HGBA1C 4.8 08/11/2016   MPG 91 08/11/2016   Lab  Results  Component Value Date   PROLACTIN 111.7 (H) 08/11/2016   PROLACTIN 66.8 (H) 06/17/2015   Lab Results  Component Value Date   CHOL 178 08/11/2016   TRIG 402 (H) 08/11/2016   HDL 13 (L) 08/11/2016   CHOLHDL 13.7 08/11/2016   VLDL UNABLE TO CALCULATE IF TRIGLYCERIDE OVER 400 mg/dL 40/98/1191   LDLCALC UNABLE TO CALCULATE IF TRIGLYCERIDE OVER 400 mg/dL 47/82/9562    LDLCALC 82 06/17/2015   Psychiatric Specialty Exam: Physical Exam  Nursing note and vitals reviewed. Constitutional: She is oriented to person, place, and time. She appears well-developed and well-nourished. No distress.  HENT:  Head: Normocephalic and atraumatic.  Eyes: EOM are normal.  Neck: Normal range of motion.  Cardiovascular: Normal rate and regular rhythm.  Respiratory: Effort normal. No respiratory distress.  Musculoskeletal: Normal range of motion.  Neurological: She is alert and oriented to person, place, and time.    Review of Systems  Constitutional: Negative.   Respiratory: Negative.   Cardiovascular: Negative.   Gastrointestinal: Negative.   Genitourinary: Positive for frequency.       Incontinence  Musculoskeletal: Negative.   Neurological: Negative.   Psychiatric/Behavioral: Negative for depression, hallucinations, memory loss, substance abuse and suicidal ideas. The patient is not nervous/anxious and does not have insomnia.     Blood pressure 104/84, pulse 74, temperature 98.1 F (36.7 C), temperature source Oral, resp. rate 18, last menstrual period 05/28/2018, SpO2 98 %.There is no height or weight on file to calculate BMI.  General Appearance: Casual  Eye Contact:  Good  Speech:  Clear and Coherent and Normal Rate  Volume:  Normal  Mood:  Euthymic  Affect:  Congruent and Constricted  Thought Process:  Goal Directed  Orientation:  Full (Time, Place, and Person)  Thought Content:  Logical, Hallucinations: None and Rumination  Suicidal Thoughts:  No  Homicidal Thoughts:  No  Memory:  Recent;   Fair Remote;   Fair  Judgement:  Fair  Insight:  Fair  Psychomotor Activity:  Normal  Concentration:  Concentration: Fair and Attention Span: Fair  Recall:  [employer] of Knowledge:  Fair  Language:  Good  Akathisia:  No  Handed:  Right  AIMS (if indicated):     Assets:  Desire for Improvement Financial Resources/Insurance  ADL's:  Intact  Cognition:  WNL   Sleep:   adequate    Treatment Plan Summary:  MsShumateis a 53 y.o.femalewith a past psych h/o Schizoaffective disorder, borderline personality disorder, who was brought to ED from group home due to suicidal ideations. The patient iswell known to theED and oftenpresents with similar presentation -SI and HI in settings of not liking her group home.  Today patient appears to be without acute changes. She has not had somatic complaints today, and has exhibited good frustration tolerance. For muscle pain, patient has ibuprofen 800 mg every 8 hours as needed for pain, fever, and Flexeril 7.5 mg 3 times daily for muscle spasm. PT consult is ordered to ensure that patient can be independent for discharge to group home facility.  There are no indications for psych medication changes at this time.Current psych medications: Risperdal  QHS, Invega Sustenna 156 mg IM once on 08/28/18; Klonopin1mg  PO BID, Luvox  PO QHS, Benztropine  PO BID. Patient has as needed medication for anxiety, which appears to be increasing as she awaits her group home placement.  Social work will call Lola Aridegbe in order to finalize paperwork for [facility] in the morning.  Group  home is needing assistance in scheduling with [facility], and clarification for PT needs.   Will continue to assess the patient while she is in ER, and make changes as indicated.   Mariel Craft, MD 09/11/2018, 9:51 AM

## 2018-09-11 NOTE — ED Notes (Signed)
Patient spoke with [name] (care coordinator) and [name] informed patient that they are still working on her paperwork and she will call patient back in a couple of days. Patient continues to be frustrated and anxious, she continues to complain of leg and neck pain. Staff encouraged patient to push through her pain and focus on positive things about herself and positive things that are to come

## 2018-09-11 NOTE — ED Notes (Signed)
Pt was assisted to the bathroom. 

## 2018-09-11 NOTE — ED Notes (Signed)
VOL/Pending Placement 

## 2018-09-11 NOTE — ED Notes (Signed)
Pt voided in her bed, clothes and bed linen were changed.

## 2018-09-12 ENCOUNTER — Emergency Department: Payer: Medicare Other

## 2018-09-12 DIAGNOSIS — F250 Schizoaffective disorder, bipolar type: Secondary | ICD-10-CM | POA: Diagnosis not present

## 2018-09-12 DIAGNOSIS — F70 Mild intellectual disabilities: Secondary | ICD-10-CM | POA: Diagnosis not present

## 2018-09-12 DIAGNOSIS — R4189 Other symptoms and signs involving cognitive functions and awareness: Secondary | ICD-10-CM | POA: Diagnosis not present

## 2018-09-12 DIAGNOSIS — F4325 Adjustment disorder with mixed disturbance of emotions and conduct: Secondary | ICD-10-CM | POA: Diagnosis not present

## 2018-09-12 DIAGNOSIS — G809 Cerebral palsy, unspecified: Secondary | ICD-10-CM | POA: Diagnosis not present

## 2018-09-12 NOTE — ED Notes (Signed)
BEHAVIORAL HEALTH ROUNDING Patient sleeping: No. Patient alert and oriented: yes Behavior appropriate: Yes.  ; If no, describe:  Nutrition and fluids offered: yes Toileting and hygiene offered: Yes  Sitter present: q15 minute observations and security camera monitoring Law enforcement present: Yes  ODS  

## 2018-09-12 NOTE — ED Notes (Addendum)

## 2018-09-12 NOTE — ED Notes (Signed)
She is sitting in the dayroom - NAD observed

## 2018-09-12 NOTE — ED Notes (Signed)
Patient says she doesn't feel well today.  Says she is nauseated.  I took her vs and she is not febrile. Abdomen is soft and she says maybe a little tender lower mid over bladder.  Says she is hurting when she moves, especially in her neck and upper chest. She did take her breakfast and asked for ginger ale as well.

## 2018-09-12 NOTE — TOC Progression Note (Addendum)
Transition of Care [facility]) - Progression Note    Patient Details  Name: [name] MRN: [id number] Date of Birth: 1965-07-28  [facility] CM/SW Contact  Sherri Ahni Bradwell, LCSW Phone Number: 09/12/2018, 9:07 AM  Clinical Narrative:    9:06am CSW was instructed by Dr. Viviano Simas to contact [name] [phone]) to receive certain paperwork that pt will need to sign for her group home placement. CSW attempted to contact her, voicemail was left.    10:31am - CSW contacted [name] from [facility] group home. Sherri stated that she will need a PPD, and COVID-19 test results, and a letter from pt stating that she will not call 911 at the group home, and if she needs to call 911 the director of the group home must first authorize the call. Sherri asked that the letter be signed by pt, and she will sign it as well.  CSW provided Sherri with her email so that she can send intake paperwork for patient to sign.   CSW notified EDP to order chest x-ray, and COVID-19 test.      11:15am - CSW asked pot to sign and date letter for [facility] group home, stating that she will not call 911.  CSW attempted to contact Sherri to make sure that she will need only one COVID-19 test for pt. Phone turned off. HIPAA compliant text message sent.     1:20pm - Pt's care coordinator contacted CSW, and shared that it may be a "couple days" for the different [employer] contracts to be processed. [name] shared that as soon as she has a confirmed date, she will let us know. [name] stated that there is a change that [facility] group home will take pt before the [employer] contracts are processed.  [name] also asked if CSW can fax a list of pt's most recent medications to [phone].   1:45pm -CSW faxed pt's care coordinator a list of current medications.   2:00pm - Sherri informed CSW that she will like COVID-19 test done when pt is ready to leave. Dr. Viviano Simas was informed.   3:10pm - Pt's care coordinator shared that the  contracts should be processed by tomorrow (09/13/18) morning the latest, and she will let CSW when [facility] has authorization to accept pt.   CSW updated Dr. Viviano Simas.    6:16pm - CSW faxed signed intake packet, letter, and list of medications to [facility] group home. Fax:  [phone]       Expected Discharge Plan and Services                                                 Social Determinants of Health (SDOH) Interventions    Readmission Risk Interventions No flowsheet data found.

## 2018-09-12 NOTE — ED Notes (Signed)
MD Viviano Simas states to hold on the Covid test until the pt is leaving that day

## 2018-09-12 NOTE — Progress Notes (Signed)
[facility] MD Progress Note  09/12/2018 9:32 AM [name]  MRN:  [id number]  Principal Problem: Adjustment disorder with mixed disturbance of emotions and conduct Diagnosis: Principal Problem:   Adjustment disorder with mixed disturbance of emotions and conduct Active Problems:   Cerebral palsy (HCC)   Mild intellectual disability   GERD (gastroesophageal reflux disease)   COPD (chronic obstructive pulmonary disease) (HCC)   Schizoaffective disorder, bipolar type (HCC)   Suicidal ideation  Patient is seen, chart is reviewed, additional.  25 minutes spent in coronation of care with conference calls to caseworker, and group home. Total Time spent with patient: 45 minutes  Patient seen today for psych f/u. Chart reviewed. Patient is calm, cooperative. watching TV.  HPI: On admission 08/08/18:  [name] is a 53 year old Caucasian female with a history ofschizoaffective disorder, intellectual disability and borderline personality disorder. She was seen in emergency department due to suicidal ideations, but once evaluated, she denied having them,and therefore she was discharged. Later on the same day, she came back expressing suicidal thoughts again, and racing thoughts.    SUBJECTIVE: "I have signed my paperwork"  On evaluation, patient is calm and cooperative.  She is denying somatic complaints today.  Patient is denying suicidal and homicidal ideation.  She is denying hallucinations today. Patient reports adequate sleep and good appetite.  She is hopeful for discharge soon to her new group home, and states she has completed paperwork.     Contact with group home, who states they are awaiting final approval from [facility].  Contact is made to [employer]/ patient's caseworker, [name], who states patient will have final approval today.  Stressed that it is imperative that we are given a date today, in order that we can ensure that patient has adequate time for COVID  testing and other medical needs to be completed for group home placement.  09/11/2018 Spoke with Lola Aridegbe [phone]) owner of group home where patient will be living. According to Ms. Lola paperwork is complete, they will need Shaniyah to sign an intake form and have that faxed back to them.  Group home is made aware that patient had assessment with physical therapy who is recommending a minimum of twice weekly physical therapy to be done in home.  Group home is also requesting assistance in arranging patient follow-up psychiatric care with [facility].  Contacted [facility] social work Haematologist who will follow-up on arrangements in the morning.   Med WG:NFAOZH, cerebral palsy, COPD.  Past psych Hx:  Dx: Schizoaffective d/o, BPD. Multiple past psych admissions.Hash/o past suicide attempts.  Current psych medications: Risperdal 1 mg with breakfast and lunch, and  QHS, Invega Sustenna 156 mg IM once on 08/28/18 with repeat dose every 28 days; Klonopin1mg  PO BID, Luvox  PO QHS, Benztropine  PO BID.  Social History:Patient is single. She livedin group home, and is awaiting placement to new group home. She is on disability. She has no children. She reports no access to guns.  Substance use: denies.   Past Medical History:  Past Medical History:  Diagnosis Date  . Anxiety   . Asthma   . Borderline personality disorder (HCC)   . Cerebral palsy (HCC)   . COPD (chronic obstructive pulmonary disease) (HCC)   . Depression   . GERD (gastroesophageal reflux disease)   . Mild cognitive impairment   . Schizoaffective disorder (HCC)   . Wound abscess     Past Surgical History:  Procedure Laterality Date  . CHOLECYSTECTOMY    .  INCISION AND DRAINAGE ABSCESS Right 01/02/2015   Procedure: INCISION AND DRAINAGE ABSCESS;  Surgeon: Tiney Rouge III, MD;  Location: [facility] ORS;  Service: General;  Laterality: Right;  . TONSILLECTOMY     Family History:  Family History  Problem Relation  Age of Onset  . Heart attack Father   . Diabetes Mother     Social History:  Social History   Substance and Sexual Activity  Alcohol Use No     Social History   Substance and Sexual Activity  Drug Use No    Social History   Socioeconomic History  . Marital status: Single    Spouse name: Not on file  . Number of children: Not on file  . Years of education: Not on file  . Highest education level: Not on file  Occupational History  . Occupation: Disabled  Social Needs  . Financial resource strain: Not on file  . Food insecurity:    Worry: Not on file    Inability: Not on file  . Transportation needs:    Medical: Not on file    Non-medical: Not on file  Tobacco Use  . Smoking status: Former Games developer  . Smokeless tobacco: Never Used  Substance and Sexual Activity  . Alcohol use: No  . Drug use: No  . Sexual activity: Not Currently  Lifestyle  . Physical activity:    Days per week: Not on file    Minutes per session: Not on file  . Stress: Not on file  Relationships  . Social connections:    Talks on phone: Not on file    Gets together: Not on file    Attends religious service: Not on file    Active member of club or organization: Not on file    Attends meetings of clubs or organizations: Not on file    Relationship status: Not on file  Other Topics Concern  . Not on file  Social History Narrative   Pt lives with mother, nephew, sister in law    Sleep: Fair  Appetite:  Good  Current Medications: Current Facility-Administered Medications  Medication Dose Route Frequency Provider Last Rate Last Dose  . acetaminophen (TYLENOL) tablet 650 mg  650 mg Oral Once Jeanmarie Plant, MD      . acetaminophen (TYLENOL) tablet 650 mg  650 mg Oral Q6H PRN Dionne Bucy, MD   650 mg at 09/08/18 1408  . acetaminophen (TYLENOL) tablet 650 mg  650 mg Oral Once Sharman Cheek, MD   Stopped at 08/23/18 1928  . benztropine (COGENTIN) tablet 2 mg  2 mg Oral BID Reggie Pile, MD   2 mg at 09/11/18 2101  . cephALEXin (KEFLEX) capsule 500 mg  500 mg Oral Q8H Arnaldo Natal, MD   500 mg at 09/12/18 0717  . clonazePAM (KLONOPIN) tablet 1 mg  1 mg Oral BID Reggie Pile, MD   1 mg at 09/11/18 2101  . cyclobenzaprine (FLEXERIL) tablet 7.5 mg  7.5 mg Oral TID PRN Mariel Craft, MD   7.5 mg at 09/09/18 2118  . diphenhydrAMINE (BENADRYL) capsule 50 mg  50 mg Oral Q8H PRN Mariel Craft, MD   50 mg at 09/09/18 2117   Or  . diphenhydrAMINE (BENADRYL) injection 50 mg  50 mg Intramuscular Q8H PRN Mariel Craft, MD   50 mg at 08/24/18 2357  . docusate sodium (COLACE) capsule 100 mg  100 mg Oral Daily Mariel Craft, MD   100 mg at  09/11/18 1200  . famotidine (PEPCID) tablet 20 mg  20 mg Oral BID Reggie Pile, MD   20 mg at 09/11/18 2102  . fenofibrate tablet 160 mg  160 mg Oral Daily Reggie Pile, MD   160 mg at 09/11/18 0959  . fluvoxaMINE (LUVOX) tablet 150 mg  150 mg Oral QHS Mariel Craft, MD   150 mg at 09/11/18 2101  . ibuprofen (ADVIL) tablet 800 mg  800 mg Oral Q8H PRN Mariel Craft, MD      . levothyroxine (SYNTHROID) tablet 50 mcg  50 mcg Oral Q0600 Reggie Pile, MD   50 mcg at 09/12/18 0717  . LORazepam (ATIVAN) tablet 1 mg  1 mg Oral BID PRN Mariel Craft, MD   1 mg at 09/08/18 1616   Or  . LORazepam (ATIVAN) injection 2 mg  2 mg Intramuscular BID PRN Mariel Craft, MD      . nitrofurantoin (MACRODANTIN) capsule 100 mg  100 mg Oral Q2000 Mariel Craft, MD   100 mg at 09/11/18 2101  . nystatin (MYCOSTATIN/NYSTOP) topical powder   Topical BID Emily Filbert, MD      . paliperidone [facility] SUSTENNA) injection 156 mg  156 mg Intramuscular Q28 days Mariel Craft, MD   156 mg at 08/28/18 0509  . pantoprazole (PROTONIX) EC tablet 40 mg  40 mg Oral Daily Reggie Pile, MD   40 mg at 09/11/18 0959  . risperiDONE (RISPERDAL) tablet 1 mg  1 mg Oral BID WC Mariel Craft, MD   1 mg at 09/12/18 1610  . risperiDONE (RISPERDAL) tablet 2 mg  2  mg Oral QHS Mariel Craft, MD   2 mg at 09/11/18 2102  . sucralfate (CARAFATE) tablet 1 g  1 g Oral TID WC & HS Reggie Pile, MD   1 g at 09/12/18 9604   Current Outpatient Medications  Medication Sig Dispense Refill  . ARIPiprazole (ABILIFY) 20 MG tablet Take 1 tablet (20 mg total) by mouth daily. 30 tablet 0  . benztropine (COGENTIN) 2 MG tablet Take 1 tablet (2 mg total) by mouth 2 (two) times daily. 60 tablet 0  . clonazePAM (KLONOPIN) 1 MG tablet Take 1 tablet (1 mg total) by mouth 2 (two) times daily. 30 tablet 0  . divalproex (DEPAKOTE ER) 500 MG 24 hr tablet Take 2 tablets (1,000 mg total) by mouth at bedtime. 60 tablet 0  . famotidine (PEPCID) 20 MG tablet Take 1 tablet (20 mg total) by mouth 2 (two) times daily. 60 tablet 0  . fenofibrate 160 MG tablet Take 1 tablet (160 mg total) by mouth daily. 30 tablet 1  . fluvoxaMINE (LUVOX) 100 MG tablet Take 1 tablet (100 mg total) by mouth at bedtime. 30 tablet 0  . levothyroxine (SYNTHROID) 50 MCG tablet Take 1 tablet (50 mcg total) by mouth daily before breakfast. 30 tablet 1  . nitrofurantoin (MACRODANTIN) 100 MG capsule Take 1 capsule (100 mg total) by mouth 2 (two) times daily. 60 capsule 0  . OLANZapine (ZYPREXA) 5 MG tablet Take 1 tablet (5 mg total) by mouth every 8 (eight) hours as needed (agitation). 30 tablet 0  . pantoprazole (PROTONIX) 40 MG tablet Take 1 tablet (40 mg total) by mouth daily. 30 tablet 1  . sucralfate (CARAFATE) 1 g tablet Take 1 tablet (1 g total) by mouth 4 (four) times daily -  with meals and at bedtime. 120 tablet 1    Lab Results: No results  found for this or any previous visit (from the past 48 hour(s)).  Blood Alcohol level:  Lab Results  Component Value Date   ETH <10 08/09/2018   ETH <10 08/01/2018    Metabolic Disorder Labs: Lab Results  Component Value Date   HGBA1C 4.8 08/11/2016   MPG 91 08/11/2016   Lab Results  Component Value Date   PROLACTIN 111.7 (H) 08/11/2016   PROLACTIN 66.8  (H) 06/17/2015   Lab Results  Component Value Date   CHOL 178 08/11/2016   TRIG 402 (H) 08/11/2016   HDL 13 (L) 08/11/2016   CHOLHDL 13.7 08/11/2016   VLDL UNABLE TO CALCULATE IF TRIGLYCERIDE OVER 400 mg/dL 16/01/9603   LDLCALC UNABLE TO CALCULATE IF TRIGLYCERIDE OVER 400 mg/dL 54/12/8117   LDLCALC 82 06/17/2015   Psychiatric Specialty Exam: Physical Exam  Nursing note and vitals reviewed. Constitutional: She is oriented to person, place, and time. She appears well-developed and well-nourished. No distress.  HENT:  Head: Normocephalic and atraumatic.  Eyes: EOM are normal.  Neck: Normal range of motion.  Cardiovascular: Normal rate and regular rhythm.  Respiratory: Effort normal. No respiratory distress.  Musculoskeletal: Normal range of motion.  Neurological: She is alert and oriented to person, place, and time.    Review of Systems  Constitutional: Negative.   Respiratory: Negative.   Cardiovascular: Negative.   Gastrointestinal: Negative.   Genitourinary: Positive for frequency.       Incontinence  Musculoskeletal: Negative.   Neurological: Negative.   Psychiatric/Behavioral: Negative for depression, hallucinations, memory loss, substance abuse and suicidal ideas. The patient is not nervous/anxious and does not have insomnia.     Blood pressure 109/86, pulse 74, temperature 97.7 F (36.5 C), temperature source Oral, resp. rate 16, last menstrual period 05/28/2018, SpO2 99 %.There is no height or weight on file to calculate BMI.  General Appearance: Casual  Eye Contact:  Good  Speech:  Clear and Coherent and Normal Rate  Volume:  Normal  Mood:  Euthymic  Affect:  Congruent and Constricted  Thought Process:  Goal Directed  Orientation:  Full (Time, Place, and Person)  Thought Content:  Logical, Hallucinations: None and Rumination  Suicidal Thoughts:  No  Homicidal Thoughts:  No  Memory:  Recent;   Fair Remote;   Fair  Judgement:  Fair  Insight:  Fair  Psychomotor  Activity:  Normal  Concentration:  Concentration: Fair and Attention Span: Fair  Recall:  [employer] of Knowledge:  Fair  Language:  Good  Akathisia:  No  Handed:  Right  AIMS (if indicated):     Assets:  Desire for Improvement Financial Resources/Insurance  ADL's:  Intact  Cognition:  WNL  Sleep:   adequate    Treatment Plan Summary:  MsShumateis a 53 y.o.femalewith a past psych h/o Schizoaffective disorder, borderline personality disorder, who was brought to ED from group home due to suicidal ideations. The patient iswell known to theED and oftenpresents with similar presentation -SI and HI in settings of not liking her group home.  Today patient appears to be without acute changes. She has not had somatic complaints today, and has exhibited good frustration tolerance over the past week due to delays and paperwork processing. For muscle pain, patient has ibuprofen 800 mg every 8 hours as needed for pain, fever, and Flexeril 7.5 mg 3 times daily for muscle spasm. PT consult is ordered to ensure that patient can be independent for discharge to group home facility.  There are no indications for  psych medication changes at this time.Current psych medications: Risperdal 2mg  QHS, Invega Sustenna 156 mg IM once on 08/28/18; Klonopin1mg  PO BID, Luvox 150mg  PO QHS, Benztropine 2mg  PO BID. Patient has as needed medication for anxiety, which appears to be increasing as she awaits her group home placement.  Social work and this Clinical research associatewriter have spoken with [employer]ola Aridegbe, from the group home as well as with Morrie SheldonAshley from [facility] in order to finalize paperwork.  We have been assured that there will be a date of mood provided to us today.  Group home is needing assistance in scheduling with [facility], and clarification for PT needs.   Will continue to assess the patient while she is in ER, and make changes as indicated.   Mariel CraftSHEILA M MAURER, MD 09/12/2018, 9:32 AM

## 2018-09-12 NOTE — ED Notes (Signed)
BEHAVIORAL HEALTH ROUNDING Patient sleeping: Yes.   Patient alert and oriented: eyes closed  Appears asleep Behavior appropriate: Yes.  ; If no, describe:  Nutrition and fluids offered: Yes  Toileting and hygiene offered: sleeping Sitter present: q 15 minute observations and security camera monitoring Law enforcement present: yes  ODS 

## 2018-09-12 NOTE — ED Notes (Signed)
ED BHU Is the patient under IVC or is there intent for IVC: voluntary  Is the patient medically cleared: Yes.   Is there vacancy in the ED BHU: Yes.   Is the population mix appropriate for patient: Yes.   Is the patient awaiting placement in inpatient or outpatient setting: Yes.   Has the patient had a psychiatric consult: Yes.   Survey of unit performed for contraband, proper placement and condition of furniture, tampering with fixtures in bathroom, shower, and each patient room: Yes.  ; Findings:  APPEARANCE/BEHAVIOR Calm and cooperative NEURO ASSESSMENT Orientation: oriented x3  Denies pain Hallucinations: No.None noted (Hallucinations)  Denies Speech: Normal Gait: normal RESPIRATORY ASSESSMENT Even  Unlabored respirations  CARDIOVASCULAR ASSESSMENT Pulses equal   regular rate  Skin warm and dry   GASTROINTESTINAL ASSESSMENT no GI complaint EXTREMITIES Full ROM  PLAN OF CARE Provide calm/safe environment. Vital signs assessed twice daily. ED BHU Assessment once each 12-hour shift. Collaborate with TTS daily or as condition indicates. Assure the ED provider has rounded once each shift. Provide and encourage hygiene. Provide redirection as needed. Assess for escalating behavior; address immediately and inform ED provider.  Assess family dynamic and appropriateness for visitation as needed: Yes.  ; If necessary, describe findings:  Educate the patient/family about BHU procedures/visitation: Yes.  ; If necessary, describe findings:   

## 2018-09-13 DIAGNOSIS — F4325 Adjustment disorder with mixed disturbance of emotions and conduct: Secondary | ICD-10-CM | POA: Diagnosis not present

## 2018-09-13 DIAGNOSIS — R4189 Other symptoms and signs involving cognitive functions and awareness: Secondary | ICD-10-CM | POA: Diagnosis not present

## 2018-09-13 NOTE — ED Notes (Signed)
Resumed care from [place], [place]. Pt is resting and watching television. No needs expressed at this time.

## 2018-09-13 NOTE — Consult Note (Signed)
[facility] ED follow-up  09/13/2018 3:31 PM [name]  MRN:  [id number] Principal Problem: Adjustment disorder with mixed disturbance of emotions and conduct Discharge Diagnoses: Principal Problem:   Adjustment disorder with mixed disturbance of emotions and conduct Active Problems:   Schizoaffective disorder, bipolar type (HCC)   Cerebral palsy (HCC)   Mild intellectual disability   GERD (gastroesophageal reflux disease)   COPD (chronic obstructive pulmonary disease) (HCC)   Suicidal ideation  From initial intake: Plan:  Social work placement at this time.  A group home evidently has been secured and they are awaiting authorization.  She does not meet criteria for inpatient psychiatric hospitalization at this time.  Assessment per Dr Viviano Simas:  Reviewed the patient's previous notes, noted that she presented to the hospital after becoming frustrated that she no longer has housing as her group home has evicted her after a 30 day notice. According to her chart the patient has multiple similar presentations, which appear to thematically revolve around reported suicidality in the setting of poor frustration tolerance, intellectual disability and psychosocial stressors. She is currently denying suicidal or homicidal ideations and is fixated on finding somewhere to live. She requested to speak to a Child psychotherapist regarding assistance in placement. She denies AVH at this time. Reportedly this is her baseline, she does not appear to warrant inpatient psychiatric hospitalization at this time and I will rescind her IVC so that SW can work with her and her ACT team for community placement.  Today, patient was seen and chart reviewed.  Patient is cooperative, anxious at times regarding her pending placement.  Denies any pain.  She has no suicidal/homicidal ideations, hallucinations, or substance abuse issues.  She is psychiatrically cleared but does not have the ability with her cognitive disability to  live independently.  Continue to await placement via social work.   Total Time spent with patient: 20 minutes  Past Psychiatric History: schizoaffective disorder, intellectual disability  Collateral obtained from patient's care coordinator, [name] 08/06/2018: Care coordinator is working on arranging interviews with group homes for [name] in order to discharge directly to a new living environment.  Collateral obtained from [facility] 08/05/2018 CST from [facility] [phone]) who expresses concern that patient is unable to adequately care for herself and unreliable and managing her own medications and finances.  He states that there has been a petition for patient to be placed under guardianship of the state, however this has not been completed.  There is a group home that is interested in interviewing [name], and arrangements are being made if patient is willing to stay in hospital to allow assistance for housing options.  Past Medical History:  Past Medical History:  Diagnosis Date  . Anxiety   . Asthma   . Borderline personality disorder (HCC)   . Cerebral palsy (HCC)   . COPD (chronic obstructive pulmonary disease) (HCC)   . Depression   . GERD (gastroesophageal reflux disease)   . Mild cognitive impairment   . Schizoaffective disorder (HCC)   . Wound abscess     Past Surgical History:  Procedure Laterality Date  . CHOLECYSTECTOMY    . INCISION AND DRAINAGE ABSCESS Right 01/02/2015   Procedure: INCISION AND DRAINAGE ABSCESS;  Surgeon: Tiney Rouge III, MD;  Location: [facility] ORS;  Service: General;  Laterality: Right;  . TONSILLECTOMY     Family History:  Family History  Problem Relation Age of Onset  . Heart attack Father   . Diabetes Mother    Family  Psychiatric  History: none  Social History:  Patient is essentially homeless at this time, as she previously was residing in a group home which had provided her 30-day notice.  Her 30-day notice is  completed.  Social History   Substance and Sexual Activity  Alcohol Use No     Social History   Substance and Sexual Activity  Drug Use No    Social History   Socioeconomic History  . Marital status: Single    Spouse name: Not on file  . Number of children: Not on file  . Years of education: Not on file  . Highest education level: Not on file  Occupational History  . Occupation: Disabled  Social Needs  . Financial resource strain: Not on file  . Food insecurity:    Worry: Not on file    Inability: Not on file  . Transportation needs:    Medical: Not on file    Non-medical: Not on file  Tobacco Use  . Smoking status: Former Games developer  . Smokeless tobacco: Never Used  Substance and Sexual Activity  . Alcohol use: No  . Drug use: No  . Sexual activity: Not Currently  Lifestyle  . Physical activity:    Days per week: Not on file    Minutes per session: Not on file  . Stress: Not on file  Relationships  . Social connections:    Talks on phone: Not on file    Gets together: Not on file    Attends religious service: Not on file    Active member of club or organization: Not on file    Attends meetings of clubs or organizations: Not on file    Relationship status: Not on file  Other Topics Concern  . Not on file  Social History Narrative   Pt lives with mother, nephew, sister in law    Has this patient used any form of tobacco in the last 30 days? (Cigarettes, Smokeless Tobacco, Cigars, and/or Pipes) NA  Current Medications: Current Facility-Administered Medications  Medication Dose Route Frequency Provider Last Rate Last Dose  . acetaminophen (TYLENOL) tablet 650 mg  650 mg Oral Once Jeanmarie Plant, MD      . acetaminophen (TYLENOL) tablet 650 mg  650 mg Oral Q6H PRN Dionne Bucy, MD   650 mg at 09/08/18 1408  . acetaminophen (TYLENOL) tablet 650 mg  650 mg Oral Once Sharman Cheek, MD   Stopped at 08/23/18 1928  . benztropine (COGENTIN) tablet 2 mg  2  mg Oral BID Reggie Pile, MD   2 mg at 09/13/18 1023  . cephALEXin (KEFLEX) capsule 500 mg  500 mg Oral Q8H Arnaldo Natal, MD   500 mg at 09/13/18 1331  . clonazePAM (KLONOPIN) tablet 1 mg  1 mg Oral BID Reggie Pile, MD   1 mg at 09/13/18 1023  . cyclobenzaprine (FLEXERIL) tablet 7.5 mg  7.5 mg Oral TID PRN Mariel Craft, MD   7.5 mg at 09/09/18 2118  . diphenhydrAMINE (BENADRYL) capsule 50 mg  50 mg Oral Q8H PRN Mariel Craft, MD   50 mg at 09/09/18 2117   Or  . diphenhydrAMINE (BENADRYL) injection 50 mg  50 mg Intramuscular Q8H PRN Mariel Craft, MD   50 mg at 08/24/18 2357  . docusate sodium (COLACE) capsule 100 mg  100 mg Oral Daily Mariel Craft, MD   100 mg at 09/13/18 1023  . famotidine (PEPCID) tablet 20 mg  20 mg Oral  BID Reggie PileJoshi, Anand, MD   20 mg at 09/13/18 1023  . fenofibrate tablet 160 mg  160 mg Oral Daily Reggie PileJoshi, Anand, MD   160 mg at 09/13/18 1022  . fluvoxaMINE (LUVOX) tablet 150 mg  150 mg Oral QHS Mariel CraftMaurer, Sheila M, MD   150 mg at 09/12/18 2112  . ibuprofen (ADVIL) tablet 800 mg  800 mg Oral Q8H PRN Mariel CraftMaurer, Sheila M, MD   800 mg at 09/13/18 0806  . levothyroxine (SYNTHROID) tablet 50 mcg  50 mcg Oral Q0600 Reggie PileJoshi, Anand, MD   50 mcg at 09/13/18 [phone]  . LORazepam (ATIVAN) tablet 1 mg  1 mg Oral BID PRN Mariel CraftMaurer, Sheila M, MD   1 mg at 09/08/18 1616   Or  . LORazepam (ATIVAN) injection 2 mg  2 mg Intramuscular BID PRN Mariel CraftMaurer, Sheila M, MD      . nitrofurantoin (MACRODANTIN) capsule 100 mg  100 mg Oral Q2000 Mariel CraftMaurer, Sheila M, MD   100 mg at 09/12/18 2111  . nystatin (MYCOSTATIN/NYSTOP) topical powder   Topical BID Emily FilbertWilliams, Jonathan E, MD      . paliperidone [facility](INVEGA SUSTENNA) injection 156 mg  156 mg Intramuscular Q28 days Mariel CraftMaurer, Sheila M, MD   156 mg at 08/28/18 0509  . pantoprazole (PROTONIX) EC tablet 40 mg  40 mg Oral Daily Reggie PileJoshi, Anand, MD   40 mg at 09/13/18 1022  . risperiDONE (RISPERDAL) tablet 1 mg  1 mg Oral BID WC Mariel CraftMaurer, Sheila M, MD   1 mg at 09/13/18 0801   . risperiDONE (RISPERDAL) tablet 2 mg  2 mg Oral QHS Mariel CraftMaurer, Sheila M, MD   2 mg at 09/12/18 2108  . sucralfate (CARAFATE) tablet 1 g  1 g Oral TID WC & HS Reggie PileJoshi, Anand, MD   1 g at 09/13/18 1204   Current Outpatient Medications  Medication Sig Dispense Refill  . ARIPiprazole (ABILIFY) 20 MG tablet Take 1 tablet (20 mg total) by mouth daily. 30 tablet 0  . benztropine (COGENTIN) 2 MG tablet Take 1 tablet (2 mg total) by mouth 2 (two) times daily. 60 tablet 0  . clonazePAM (KLONOPIN) 1 MG tablet Take 1 tablet (1 mg total) by mouth 2 (two) times daily. 30 tablet 0  . divalproex (DEPAKOTE ER) 500 MG 24 hr tablet Take 2 tablets (1,000 mg total) by mouth at bedtime. 60 tablet 0  . famotidine (PEPCID) 20 MG tablet Take 1 tablet (20 mg total) by mouth 2 (two) times daily. 60 tablet 0  . fenofibrate 160 MG tablet Take 1 tablet (160 mg total) by mouth daily. 30 tablet 1  . fluvoxaMINE (LUVOX) 100 MG tablet Take 1 tablet (100 mg total) by mouth at bedtime. 30 tablet 0  . levothyroxine (SYNTHROID) 50 MCG tablet Take 1 tablet (50 mcg total) by mouth daily before breakfast. 30 tablet 1  . nitrofurantoin (MACRODANTIN) 100 MG capsule Take 1 capsule (100 mg total) by mouth 2 (two) times daily. 60 capsule 0  . OLANZapine (ZYPREXA) 5 MG tablet Take 1 tablet (5 mg total) by mouth every 8 (eight) hours as needed (agitation). 30 tablet 0  . pantoprazole (PROTONIX) 40 MG tablet Take 1 tablet (40 mg total) by mouth daily. 30 tablet 1  . sucralfate (CARAFATE) 1 g tablet Take 1 tablet (1 g total) by mouth 4 (four) times daily -  with meals and at bedtime. 120 tablet 1   PTA Medications: (Not in a hospital admission)   Musculoskeletal: Strength & Muscle Tone: within  normal limits Gait & Station: normal Patient leans: N/A  Psychiatric Specialty Exam: Physical Exam  Nursing note and vitals reviewed. Constitutional: She is oriented to person, place, and time. She appears well-developed and well-nourished. No  distress.  HENT:  Head: Normocephalic.  Eyes: EOM are normal.  Neck: Normal range of motion.  Cardiovascular: Normal rate.  Respiratory: Effort normal. No respiratory distress.  Musculoskeletal: Normal range of motion.  Neurological: She is alert and oriented to person, place, and time.  Psychiatric: Her speech is normal. Judgment and thought content normal. Her mood appears anxious. Her affect is blunt. She is slowed. Cognition and memory are impaired.    Review of Systems  Psychiatric/Behavioral: Negative for depression, hallucinations, substance abuse and suicidal ideas. The patient is nervous/anxious.   All other systems reviewed and are negative.   Blood pressure 118/74, pulse 84, temperature 97.9 F (36.6 C), temperature source Oral, resp. rate 20, last menstrual period 05/28/2018, SpO2 98 %.There is no height or weight on file to calculate BMI.  General Appearance: Casual  Eye Contact:  Good  Speech:  Slow  Volume:  Normal  Mood:  Anxious  Affect:  Blunt  Thought Process:  Coherent and Descriptions of Associations: Intact  Orientation:  Full (Time, Place, and Person)  Thought Content:  perseverative about moving towards [facility] to live near to family  Suicidal Thoughts:  No  Homicidal Thoughts:  No  Memory:  Immediate;   Good Recent;   Good Remote;   Good  Judgement:  Other:  Limited  Insight:  Shallow  Psychomotor Activity:  Normal  Concentration:  Concentration: Good and Attention Span: Good  Recall:  [employer] of Knowledge:  Fair  Language:  Good  Akathisia:  No  Handed:  Right  AIMS (if indicated):   0  Assets:  Resilience Social Support  ADL's:  Intact  Cognition:  WNL  Sleep:   Adequate   Demographic Factors:  Caucasian  Loss Factors: NA  Historical Factors: Impulsivity  Risk Reduction Factors:   Sense of responsibility to family, Living with another person, especially a relative, Positive social support and Positive therapeutic  relationship  Continued Clinical Symptoms:  Anxiety, mild  Cognitive Features That Contribute To Risk:  None    Suicide Risk:  Minimal: No identifiable suicidal ideation.  Patients presenting with no risk factors but with morbid ruminations; may be classified as minimal risk based on the severity of the depressive symptoms   Plan Of Care/Follow-up recommendations:   Adjustment disorder with mixed disturbance of emotions and conduct: Schizoaffective disorder, depressive disorder: Continued Luvox 150 mg at bedtime for anxiety and depressive disorder Continued Risperdal 1 mg BID and 2 mg at bedtime  Anxiety: Klonopin 1 mg twice a day breakfast and dinner for anxiety  Asthma: -albuterol 2 puffs every hours Activity:  as tolerated Diet:  heart healthy diet   Other medical:  UTI -Keflex 500 mg every 8 hours Continued Cogentin 2 mg twice daily for EPS prevention, and to maximize anticholinergic effect for assistance in decreasing urinary incontinence. -Continued macrodantin 100 mg daily  Disposition: Continue working with her ACT team/Social work for placement in another group home setting, as patient is unlikely to be successful living independently.  Patient is agreeable at this time to staying in emergency department to receive assistance from social work on housing.  Nanine Means, NP 09/13/2018, 3:31 PM

## 2018-09-13 NOTE — ED Notes (Signed)
Pt given 10:00 meds; pt snoring upon this nurse's arrival and had to be coaxed awake. When she awakened, she was alert & oriented.

## 2018-09-13 NOTE — ED Provider Notes (Signed)
-----------------------------------------   6:20 AM on 09/13/2018 -----------------------------------------   Blood pressure 115/81, pulse 69, temperature 98 F (36.7 C), temperature source Oral, resp. rate 15, last menstrual period 05/28/2018, SpO2 98 %.  The patient is calm and cooperative at this time.  There have been no acute events since the last update.  Awaiting disposition plan from social work/behavioral Medicine team.   Irean Hong, MD 09/13/18 [phone]

## 2018-09-14 DIAGNOSIS — R4189 Other symptoms and signs involving cognitive functions and awareness: Secondary | ICD-10-CM | POA: Diagnosis not present

## 2018-09-14 DIAGNOSIS — F4325 Adjustment disorder with mixed disturbance of emotions and conduct: Secondary | ICD-10-CM | POA: Diagnosis not present

## 2018-09-14 NOTE — ED Notes (Addendum)
Pt to toilet at this time, to NS requesting medicine for sleep

## 2018-09-14 NOTE — ED Notes (Signed)
Pt has taken a shower. Bed cleaned and linen changed by nurse tech.Maintained on 15 minute checks and observation by security camera for safety.

## 2018-09-14 NOTE — Consult Note (Signed)
[facility] follow-up  09/14/2018 1:52 PM Peony Lavorgna  MRN:  [id number] Principal Problem: Adjustment disorder with mixed disturbance of emotions and conduct Discharge Diagnoses: Principal Problem:   Adjustment disorder with mixed disturbance of emotions and conduct Active Problems:   Schizoaffective disorder, bipolar type (HCC)   Cerebral palsy (HCC)   Mild intellectual disability   GERD (gastroesophageal reflux disease)   COPD (chronic obstructive pulmonary disease) (HCC)   Suicidal ideation  From initial intake: Plan:  Social work placement at this time.  A group home evidently has been secured and they are awaiting authorization.  She does not meet criteria for inpatient psychiatric hospitalization at this time.  Assessment per Dr Viviano Simas:  Reviewed the patient's previous notes, noted that she presented to the hospital after becoming frustrated that she no longer has housing as her group home has evicted her after a 30 day notice. According to her chart the patient has multiple similar presentations, which appear to thematically revolve around reported suicidality in the setting of poor frustration tolerance, intellectual disability and psychosocial stressors. She is currently denying suicidal or homicidal ideations and is fixated on finding somewhere to live. She requested to speak to a Child psychotherapist regarding assistance in placement. She denies AVH at this time. Reportedly this is her baseline, she does not appear to warrant inpatient psychiatric hospitalization at this time and I will rescind her IVC so that SW can work with her and her ACT team for community placement.  Today, patient was seen and chart reviewed.  Patient is cooperative, anxious at times regarding her pending placement, frequently asking about if this provider has heard anything about her new place.  Denies any pain.  She has no suicidal/homicidal ideations, hallucinations, or substance abuse issues.  She is  psychiatrically cleared but does not have the ability with her cognitive disability to live independently.  Continue to await placement via social work.   Total Time spent with patient: 20 minutes  Past Psychiatric History: schizoaffective disorder, intellectual disability  Collateral obtained from patient's care coordinator, [name] 08/06/2018: Care coordinator is working on arranging interviews with group homes for [place] in order to discharge directly to a new living environment.  Collateral obtained from [facility] 08/05/2018 CST from [facility] [phone]) who expresses concern that patient is unable to adequately care for herself and unreliable and managing her own medications and finances.  He states that there has been a petition for patient to be placed under guardianship of the state, however this has not been completed.  There is a group home that is interested in interviewing [facility], and arrangements are being made if patient is willing to stay in hospital to allow assistance for housing options.  Past Medical History:  Past Medical History:  Diagnosis Date  . Anxiety   . Asthma   . Borderline personality disorder (HCC)   . Cerebral palsy (HCC)   . COPD (chronic obstructive pulmonary disease) (HCC)   . Depression   . GERD (gastroesophageal reflux disease)   . Mild cognitive impairment   . Schizoaffective disorder (HCC)   . Wound abscess     Past Surgical History:  Procedure Laterality Date  . CHOLECYSTECTOMY    . INCISION AND DRAINAGE ABSCESS Right 01/02/2015   Procedure: INCISION AND DRAINAGE ABSCESS;  Surgeon: Tiney Rouge III, MD;  Location: [facility] ORS;  Service: General;  Laterality: Right;  . TONSILLECTOMY     Family History:  Family History  Problem Relation Age of Onset  .  Heart attack Father   . Diabetes Mother    Family Psychiatric  History: none  Social History:  Patient is essentially homeless at this time, as she previously was  residing in a group home which had provided her 30-day notice.  Her 30-day notice is completed.  Social History   Substance and Sexual Activity  Alcohol Use No     Social History   Substance and Sexual Activity  Drug Use No    Social History   Socioeconomic History  . Marital status: Single    Spouse name: Not on file  . Number of children: Not on file  . Years of education: Not on file  . Highest education level: Not on file  Occupational History  . Occupation: Disabled  Social Needs  . Financial resource strain: Not on file  . Food insecurity:    Worry: Not on file    Inability: Not on file  . Transportation needs:    Medical: Not on file    Non-medical: Not on file  Tobacco Use  . Smoking status: Former Games developer  . Smokeless tobacco: Never Used  Substance and Sexual Activity  . Alcohol use: No  . Drug use: No  . Sexual activity: Not Currently  Lifestyle  . Physical activity:    Days per week: Not on file    Minutes per session: Not on file  . Stress: Not on file  Relationships  . Social connections:    Talks on phone: Not on file    Gets together: Not on file    Attends religious service: Not on file    Active member of club or organization: Not on file    Attends meetings of clubs or organizations: Not on file    Relationship status: Not on file  Other Topics Concern  . Not on file  Social History Narrative   Pt lives with mother, nephew, sister in law    Has this patient used any form of tobacco in the last 30 days? (Cigarettes, Smokeless Tobacco, Cigars, and/or Pipes) NA  Current Medications: Current Facility-Administered Medications  Medication Dose Route Frequency Provider Last Rate Last Dose  . acetaminophen (TYLENOL) tablet 650 mg  650 mg Oral Once Jeanmarie Plant, MD      . acetaminophen (TYLENOL) tablet 650 mg  650 mg Oral Q6H PRN Dionne Bucy, MD   650 mg at 09/08/18 1408  . acetaminophen (TYLENOL) tablet 650 mg  650 mg Oral Once  Sharman Cheek, MD   Stopped at 08/23/18 1928  . benztropine (COGENTIN) tablet 2 mg  2 mg Oral BID Reggie Pile, MD   2 mg at 09/14/18 1253  . clonazePAM (KLONOPIN) tablet 1 mg  1 mg Oral BID Reggie Pile, MD   1 mg at 09/14/18 1252  . cyclobenzaprine (FLEXERIL) tablet 7.5 mg  7.5 mg Oral TID PRN Mariel Craft, MD   7.5 mg at 09/09/18 2118  . diphenhydrAMINE (BENADRYL) capsule 50 mg  50 mg Oral Q8H PRN Mariel Craft, MD   50 mg at 09/14/18 1610   Or  . diphenhydrAMINE (BENADRYL) injection 50 mg  50 mg Intramuscular Q8H PRN Mariel Craft, MD   50 mg at 08/24/18 2357  . docusate sodium (COLACE) capsule 100 mg  100 mg Oral Daily Mariel Craft, MD   100 mg at 09/14/18 1253  . famotidine (PEPCID) tablet 20 mg  20 mg Oral BID Reggie Pile, MD   20 mg at 09/14/18 1254  .  fenofibrate tablet 160 mg  160 mg Oral Daily Reggie Pile, MD   160 mg at 09/14/18 1255  . fluvoxaMINE (LUVOX) tablet 150 mg  150 mg Oral QHS Mariel Craft, MD   150 mg at 09/13/18 2148  . ibuprofen (ADVIL) tablet 800 mg  800 mg Oral Q8H PRN Mariel Craft, MD   800 mg at 09/13/18 0806  . levothyroxine (SYNTHROID) tablet 50 mcg  50 mcg Oral Q0600 Reggie Pile, MD   50 mcg at 09/13/18 [phone]  . LORazepam (ATIVAN) tablet 1 mg  1 mg Oral BID PRN Mariel Craft, MD   1 mg at 09/08/18 1616   Or  . LORazepam (ATIVAN) injection 2 mg  2 mg Intramuscular BID PRN Mariel Craft, MD      . nitrofurantoin (MACRODANTIN) capsule 100 mg  100 mg Oral Q2000 Mariel Craft, MD   100 mg at 09/13/18 2149  . nystatin (MYCOSTATIN/NYSTOP) topical powder   Topical BID Emily Filbert, MD      . paliperidone [facility] SUSTENNA) injection 156 mg  156 mg Intramuscular Q28 days Mariel Craft, MD   156 mg at 08/28/18 0509  . pantoprazole (PROTONIX) EC tablet 40 mg  40 mg Oral Daily Reggie Pile, MD   40 mg at 09/14/18 1253  . risperiDONE (RISPERDAL) tablet 1 mg  1 mg Oral BID WC Mariel Craft, MD   1 mg at 09/14/18 1254  .  risperiDONE (RISPERDAL) tablet 2 mg  2 mg Oral QHS Mariel Craft, MD   2 mg at 09/13/18 2148  . sucralfate (CARAFATE) tablet 1 g  1 g Oral TID WC & HS Reggie Pile, MD   1 g at 09/14/18 1255   Current Outpatient Medications  Medication Sig Dispense Refill  . ARIPiprazole (ABILIFY) 20 MG tablet Take 1 tablet (20 mg total) by mouth daily. 30 tablet 0  . benztropine (COGENTIN) 2 MG tablet Take 1 tablet (2 mg total) by mouth 2 (two) times daily. 60 tablet 0  . clonazePAM (KLONOPIN) 1 MG tablet Take 1 tablet (1 mg total) by mouth 2 (two) times daily. 30 tablet 0  . divalproex (DEPAKOTE ER) 500 MG 24 hr tablet Take 2 tablets (1,000 mg total) by mouth at bedtime. 60 tablet 0  . famotidine (PEPCID) 20 MG tablet Take 1 tablet (20 mg total) by mouth 2 (two) times daily. 60 tablet 0  . fenofibrate 160 MG tablet Take 1 tablet (160 mg total) by mouth daily. 30 tablet 1  . fluvoxaMINE (LUVOX) 100 MG tablet Take 1 tablet (100 mg total) by mouth at bedtime. 30 tablet 0  . levothyroxine (SYNTHROID) 50 MCG tablet Take 1 tablet (50 mcg total) by mouth daily before breakfast. 30 tablet 1  . nitrofurantoin (MACRODANTIN) 100 MG capsule Take 1 capsule (100 mg total) by mouth 2 (two) times daily. 60 capsule 0  . OLANZapine (ZYPREXA) 5 MG tablet Take 1 tablet (5 mg total) by mouth every 8 (eight) hours as needed (agitation). 30 tablet 0  . pantoprazole (PROTONIX) 40 MG tablet Take 1 tablet (40 mg total) by mouth daily. 30 tablet 1  . sucralfate (CARAFATE) 1 g tablet Take 1 tablet (1 g total) by mouth 4 (four) times daily -  with meals and at bedtime. 120 tablet 1   PTA Medications: (Not in a hospital admission)   Musculoskeletal: Strength & Muscle Tone: within normal limits Gait & Station: normal Patient leans: N/A  Psychiatric Specialty Exam:  Physical Exam  Nursing note and vitals reviewed. Constitutional: She is oriented to person, place, and time. She appears well-developed and well-nourished. No  distress.  HENT:  Head: Normocephalic.  Eyes: EOM are normal.  Neck: Normal range of motion.  Cardiovascular: Normal rate.  Respiratory: Effort normal. No respiratory distress.  Musculoskeletal: Normal range of motion.  Neurological: She is alert and oriented to person, place, and time.  Psychiatric: Her speech is normal. Judgment and thought content normal. Her mood appears anxious. Her affect is blunt. She is slowed. Cognition and memory are impaired.    Review of Systems  Psychiatric/Behavioral: Negative for depression, hallucinations, substance abuse and suicidal ideas. The patient is nervous/anxious.   All other systems reviewed and are negative.   Blood pressure 92/65, pulse 88, temperature 98 F (36.7 C), temperature source Oral, resp. rate 18, last menstrual period 05/28/2018, SpO2 96 %.There is no height or weight on file to calculate BMI.  General Appearance: Casual  Eye Contact:  Good  Speech:  Slow  Volume:  Normal  Mood:  Anxious  Affect:  Blunt  Thought Process:  Coherent and Descriptions of Associations: Intact  Orientation:  Full (Time, Place, and Person)  Thought Content:  [name]  Suicidal Thoughts:  No  Homicidal Thoughts:  No  Memory:  Immediate;   Good Recent;   Good Remote;   Good  Judgement:  Other:  Limited  Insight:  Shallow  Psychomotor Activity:  Normal  Concentration:  Concentration: Good and Attention Span: Good  Recall:  [employer]  Fund of Knowledge:  Fair  Language:  Good  Akathisia:  No  Handed:  Right  AIMS (if indicated):   0  Assets:  Resilience Social Support  ADL's:  Intact  Cognition:  WNL  Sleep:   Adequate   Demographic Factors:  Caucasian  Loss Factors: NA  Historical Factors: Impulsivity  Risk Reduction Factors:   Sense of responsibility to family, Living with another person, especially a relative, Positive social support and Positive therapeutic  relationship  Continued Clinical Symptoms:  Anxiety, mild  Cognitive Features That Contribute To Risk:  None    Suicide Risk:  Minimal: No identifiable suicidal ideation.  Patients presenting with no risk factors but with morbid ruminations; may be classified as minimal risk based on the severity of the depressive symptoms   Plan Of Care/Follow-up recommendations:   Adjustment disorder with mixed disturbance of emotions and conduct: Schizoaffective disorder, depressive disorder: Continued Luvox 150 mg at bedtime for anxiety and depressive disorder Continued Risperdal 1 mg BID and 2 mg at bedtime  Anxiety: Klonopin 1 mg twice a day breakfast and dinner for anxiety  Asthma: -albuterol 2 puffs every hours Activity:  as tolerated Diet:  heart healthy diet   Other medical:  UTI Continued Cogentin 2 mg twice daily for EPS prevention, and to maximize anticholinergic effect for assistance in decreasing urinary incontinence. -Continued macrodantin 100 mg daily  Disposition: Continue working with her ACT team/Social work for placement in another group home setting, as patient is unlikely to be successful living independently.  Patient is agreeable at this time to staying in emergency department to receive assistance from social work on housing.  Nanine MeansJamison Medea Deines, NP 09/14/2018, 1:52 PM

## 2018-09-14 NOTE — ED Provider Notes (Signed)
-----------------------------------------   6:44 AM on 09/14/2018 -----------------------------------------   Blood pressure 118/74, pulse 84, temperature 97.9 F (36.6 C), temperature source Oral, resp. rate 20, last menstrual period 05/28/2018, SpO2 98 %.  The patient is calm and cooperative at this time.  There have been no acute events since the last update.  Awaiting disposition plan from CSW/Behavioral Medicine team.   Irean Hong, MD 09/14/18 [phone]

## 2018-09-14 NOTE — ED Notes (Signed)
Pt to use toilet att, no needs expressed att

## 2018-09-14 NOTE — ED Notes (Signed)
Pt given water as requested, pt appears anxious as other pt relates displeasure to this RN

## 2018-09-15 DIAGNOSIS — R4189 Other symptoms and signs involving cognitive functions and awareness: Secondary | ICD-10-CM | POA: Diagnosis not present

## 2018-09-15 DIAGNOSIS — F4325 Adjustment disorder with mixed disturbance of emotions and conduct: Secondary | ICD-10-CM | POA: Diagnosis not present

## 2018-09-15 MED ORDER — FLUCONAZOLE 100 MG PO TABS
150.0000 mg | ORAL_TABLET | Freq: Once | ORAL | Status: AC
  Administered 2018-09-15: 150 mg via ORAL
  Filled 2018-09-15 (×2): qty 1.5

## 2018-09-15 NOTE — ED Notes (Signed)
Pt called the sheriff's office from patient phone. This Clinical research associate told patient she was not allowed anymore phone calls this shift.

## 2018-09-15 NOTE — ED Notes (Signed)
Patient in day room talking loudly even after attempts to redirect.

## 2018-09-15 NOTE — ED Notes (Signed)
Offered pt a snack, pt only wanted ginger-ale.AS

## 2018-09-15 NOTE — ED Notes (Signed)
Pt cursing at staff. Insisting on being discharged today from the hospital. Maintained on 15 minute checks and observation by security camera for safety.

## 2018-09-15 NOTE — ED Notes (Signed)
Report to include Situation, Background, Assessment, and Recommendations received from Amy B. RN. Patient alert and oriented, warm and dry, in no acute distress. Patient denies VH and pain. Patient states she is SI without plan and HI towards Unnamed person as well as hearing voices telling her to "kill herself". Patient made aware of Q15 minute rounds and security cameras for their safety. Patient instructed to come to me with needs or concerns.

## 2018-09-15 NOTE — ED Notes (Signed)
Hourly rounding reveals patient in day room. No complaints, stable, in no acute distress. Q15 minute rounds and monitoring via Security Cameras to continue. 

## 2018-09-15 NOTE — ED Notes (Signed)
Pt continuing to yell and cruse in the dayroom. Pt needing verbal de-escalation.  Maintained on 15 minute checks and observation by security camera for safety.

## 2018-09-15 NOTE — ED Provider Notes (Signed)
-----------------------------------------   12:41 AM on 09/15/2018 -----------------------------------------   Blood pressure 135/60, pulse 75, temperature 98.2 F (36.8 C), temperature source Oral, resp. rate 16, last menstrual period 05/28/2018, SpO2 99 %.  The patient is calm and cooperative at this time.  There have been no acute events since the last update.  Awaiting disposition plan from Behavioral Medicine team.    Willy Eddy, MD 09/15/18 [phone]

## 2018-09-15 NOTE — ED Notes (Signed)
Pt. Stated she had urinated in bed some.  Pt. Given new sheet and replacement chucks.  Pt. Calm and cooperative tonight, patients gait also appears better.  Pt. Denies SI or HI feeling at this moment.

## 2018-09-15 NOTE — ED Notes (Signed)
Hourly rounding reveals patient sleeping in room. No complaints, stable, in no acute distress. Q15 minute rounds and monitoring via Security Cameras to continue. 

## 2018-09-15 NOTE — Consult Note (Signed)
[facility]  09/15/2018 1:03 PM [name]  MRN:  [id number] Principal Problem: Adjustment disorder with mixed disturbance of emotions and conduct Discharge Diagnoses: Principal Problem:   Adjustment disorder with mixed disturbance of emotions and conduct Active Problems:   Schizoaffective disorder, bipolar type (HCC)   Cerebral palsy (HCC)   Mild intellectual disability   GERD (gastroesophageal reflux disease)   COPD (chronic obstructive pulmonary disease) (HCC)   Suicidal ideation  From initial intake: Plan:  Social work placement at this time.  A group home evidently has been secured and they are awaiting authorization.  She does not meet criteria for inpatient psychiatric hospitalization at this time.  Assessment per Dr Viviano SimasMaurer:  Reviewed the patient's previous notes, noted that she presented to the hospital after becoming frustrated that she no longer has housing as her group home has evicted her after a 30 day notice. According to her chart the patient has multiple similar presentations, which appear to thematically revolve around reported suicidality in the setting of poor frustration tolerance, intellectual disability and psychosocial stressors. She is currently denying suicidal or homicidal ideations and is fixated on finding somewhere to live. She requested to speak to a Child psychotherapistsocial worker regarding assistance in placement. She denies AVH at this time. Reportedly this is her baseline, she does not appear to warrant inpatient psychiatric hospitalization at this time and I will rescind her IVC so that SW can work with her and her ACT team for community placement.  Today, patient was seen and chart reviewed.  Patient is cooperative, reports her neck is sore, encouraged her to take her PRN medications Flexeril and/or ibuprofen.  Later complained about vaginal itching, just completed and antibiotic for UTI--Fluconazole one time ordered for symptoms of yeast infection.  Denies any  pain.  She has no suicidal/homicidal ideations, hallucinations, or substance abuse issues.  She is psychiatrically cleared but does not have the ability with her cognitive disability to live independently.  Continue to await placement via social work.   Total Time spent with patient: 20 minutes  Past Psychiatric History: schizoaffective disorder, intellectual disability  Collateral obtained from patient's care coordinator, Morrie Sheldonshley 08/06/2018: Care coordinator is working on arranging interviews with group homes for [name] in order to discharge directly to a new living environment.  [facility] 08/05[facility]2018 CST from Psychotherapeutic Services [phone]) who expresses concern that patient is unable to adequately care for herself and unreliable and managing her own medications and finances.  He states that there has been a petition for patient to be placed under guardianship of the state, however this has not been completed.  There is a group home that is [place] in [name], and arrangements are being made if patient is willing to stay in hospital to allow assistance for housing options.  Past Medical History:  Past Medical History:  Diagnosis Date  . Anxiety   . Asthma   . Borderline personality disorder (HCC)   . Cerebral palsy (HCC)   . COPD (chronic obstructive pulmonary disease) (HCC)   . Depression   . GERD (gastroesophageal reflux disease)   . Mild cognitive impairment   . Schizoaffective disorder (HCC)   . Wound abscess     Past Surgical History:  Procedure Laterality Date  . CHOLECYSTECTOMY    . INCISION AND DRAINAGE ABSCESS Right 01/02/2015   Procedure: INCISION AND DRAINAGE ABSCESS;  Surgeon: Tiney Rougealph Ely III, MD;  Location: [facility] ORS;  Service: General;  Laterality: Right;  . TONSILLECTOMY  Family History:  Family History  Problem Relation Age of Onset  . Heart attack Father   . Diabetes Mother    Family Psychiatric   History: none  Social History:  Patient is essentially homeless at this time, as she previously was residing in a group home which had provided her 30-day notice.  Her 30-day notice is completed.  Social History   Substance and Sexual Activity  Alcohol Use No     Social History   Substance and Sexual Activity  Drug Use No    Social History   Socioeconomic History  . Marital status: Single    Spouse name: Not on file  . Number of children: Not on file  . Years of education: Not on file  . Highest education level: Not on file  Occupational History  . Occupation: Disabled  Social Needs  . Financial resource strain: Not on file  . Food insecurity:    Worry: Not on file    Inability: Not on file  . Transportation needs:    Medical: Not on file    Non-medical: Not on file  Tobacco Use  . Smoking status: Former Games developer  . Smokeless tobacco: Never Used  Substance and Sexual Activity  . Alcohol use: No  . Drug use: No  . Sexual activity: Not Currently  Lifestyle  . Physical activity:    Days per week: Not on file    Minutes per session: Not on file  . Stress: Not on file  Relationships  . Social connections:    Talks on phone: Not on file    Gets together: Not on file    Attends religious service: Not on file    Active member of club or organization: Not on file    Attends meetings of clubs or organizations: Not on file    Relationship status: Not on file  Other Topics Concern  . Not on file  Social History Narrative   Pt lives with mother, nephew, sister in law    Has this patient used any form of tobacco in the last 30 days? (Cigarettes, Smokeless Tobacco, Cigars, and/or Pipes) NA  Current Medications: Current Facility-Administered Medications  Medication Dose Route Frequency Provider Last Rate Last Dose  . acetaminophen (TYLENOL) tablet 650 mg  650 mg Oral Once Jeanmarie Plant, MD      . acetaminophen (TYLENOL) tablet 650 mg  650 mg Oral Q6H PRN Dionne Bucy, MD   650 mg at 09/14/18 2143  . acetaminophen (TYLENOL) tablet 650 mg  650 mg Oral Once Sharman Cheek, MD   Stopped at 08/23/18 1928  . benztropine (COGENTIN) tablet 2 mg  2 mg Oral BID Reggie Pile, MD   2 mg at 09/15/18 1117  . clonazePAM (KLONOPIN) tablet 1 mg  1 mg Oral BID Reggie Pile, MD   1 mg at 09/15/18 1118  . cyclobenzaprine (FLEXERIL) tablet 7.5 mg  7.5 mg Oral TID PRN Mariel Craft, MD   7.5 mg at 09/09/18 2118  . diphenhydrAMINE (BENADRYL) capsule 50 mg  50 mg Oral Q8H PRN Mariel Craft, MD   50 mg at 09/14/18 7588   Or  . diphenhydrAMINE (BENADRYL) injection 50 mg  50 mg Intramuscular Q8H PRN Mariel Craft, MD   50 mg at 08/24/18 2357  . docusate sodium (COLACE) capsule 100 mg  100 mg Oral Daily Mariel Craft, MD   100 mg at 09/15/18 1119  . famotidine (PEPCID) tablet 20 mg  20  mg Oral BID Reggie Pile, MD   20 mg at 09/15/18 1119  . fenofibrate tablet 160 mg  160 mg Oral Daily Reggie Pile, MD   160 mg at 09/15/18 1117  . fluconazole (DIFLUCAN) tablet 150 mg  150 mg Oral Once Charm Rings, NP      . fluvoxaMINE (LUVOX) tablet 150 mg  150 mg Oral QHS Mariel Craft, MD   150 mg at 09/13/18 2148  . ibuprofen (ADVIL) tablet 800 mg  800 mg Oral Q8H PRN Mariel Craft, MD   800 mg at 09/13/18 0806  . levothyroxine (SYNTHROID) tablet 50 mcg  50 mcg Oral Q0600 Reggie Pile, MD   50 mcg at 09/15/18 [phone]  . LORazepam (ATIVAN) tablet 1 mg  1 mg Oral BID PRN Mariel Craft, MD   1 mg at 09/08/18 1616   Or  . LORazepam (ATIVAN) injection 2 mg  2 mg Intramuscular BID PRN Mariel Craft, MD      . nitrofurantoin (MACRODANTIN) capsule 100 mg  100 mg Oral Q2000 Mariel Craft, MD   100 mg at 09/13/18 2149  . nystatin (MYCOSTATIN/NYSTOP) topical powder   Topical BID Emily Filbert, MD      . paliperidone [facility] SUSTENNA) injection 156 mg  156 mg Intramuscular Q28 days Mariel Craft, MD   156 mg at 08/28/18 0509  . pantoprazole (PROTONIX) EC  tablet 40 mg  40 mg Oral Daily Reggie Pile, MD   40 mg at 09/15/18 1118  . risperiDONE (RISPERDAL) tablet 1 mg  1 mg Oral BID WC Mariel Craft, MD   1 mg at 09/15/18 1119  . risperiDONE (RISPERDAL) tablet 2 mg  2 mg Oral QHS Mariel Craft, MD   2 mg at 09/14/18 2144  . sucralfate (CARAFATE) tablet 1 g  1 g Oral TID WC & HS Reggie Pile, MD   1 g at 09/15/18 1118   Current Outpatient Medications  Medication Sig Dispense Refill  . ARIPiprazole (ABILIFY) 20 MG tablet Take 1 tablet (20 mg total) by mouth daily. 30 tablet 0  . benztropine (COGENTIN) 2 MG tablet Take 1 tablet (2 mg total) by mouth 2 (two) times daily. 60 tablet 0  . clonazePAM (KLONOPIN) 1 MG tablet Take 1 tablet (1 mg total) by mouth 2 (two) times daily. 30 tablet 0  . divalproex (DEPAKOTE ER) 500 MG 24 hr tablet Take 2 tablets (1,000 mg total) by mouth at bedtime. 60 tablet 0  . famotidine (PEPCID) 20 MG tablet Take 1 tablet (20 mg total) by mouth 2 (two) times daily. 60 tablet 0  . fenofibrate 160 MG tablet Take 1 tablet (160 mg total) by mouth daily. 30 tablet 1  . fluvoxaMINE (LUVOX) 100 MG tablet Take 1 tablet (100 mg total) by mouth at bedtime. 30 tablet 0  . levothyroxine (SYNTHROID) 50 MCG tablet Take 1 tablet (50 mcg total) by mouth daily before breakfast. 30 tablet 1  . nitrofurantoin (MACRODANTIN) 100 MG capsule Take 1 capsule (100 mg total) by mouth 2 (two) times daily. 60 capsule 0  . OLANZapine (ZYPREXA) 5 MG tablet Take 1 tablet (5 mg total) by mouth every 8 (eight) hours as needed (agitation). 30 tablet 0  . pantoprazole (PROTONIX) 40 MG tablet Take 1 tablet (40 mg total) by mouth daily. 30 tablet 1  . sucralfate (CARAFATE) 1 g tablet Take 1 tablet (1 g total) by mouth 4 (four) times daily -  with meals and  at bedtime. 120 tablet 1   PTA Medications: (Not in a hospital admission)   Musculoskeletal: Strength & Muscle Tone: within normal limits Gait & Station: normal Patient leans: N/A  Psychiatric  Specialty Exam: Physical Exam  Nursing note and vitals reviewed. Constitutional: She is oriented to person, place, and time. She appears well-developed and well-nourished. No distress.  HENT:  Head: Normocephalic.  Eyes: EOM are normal.  Neck: Normal range of motion.  Cardiovascular: Normal rate.  Respiratory: Effort normal. No respiratory distress.  Musculoskeletal: Normal range of motion.  Neurological: She is alert and oriented to person, place, and time.  Psychiatric: Her speech is normal. Judgment and thought content normal. Her mood appears anxious. Her affect is blunt. She is slowed. Cognition and memory are impaired.    Review of Systems  Musculoskeletal: Positive for neck pain.  Skin: Positive for itching.  Psychiatric/Behavioral: Negative for depression, hallucinations, substance abuse and suicidal ideas. The patient is nervous/anxious.   All other systems reviewed and are negative.   Blood pressure 135/60, pulse 75, temperature 98.2 F (36.8 C), temperature source Oral, resp. rate 16, last menstrual period 05/28/2018, SpO2 99 %.There is no height or weight on file to calculate BMI.  General Appearance: Casual  Eye Contact:  Good  Speech:  Slow  Volume:  Normal  Mood:  Anxious  Affect:  Blunt  Thought Process:  Coherent and Descriptions of Associations: Intact  Orientation:  Full (Time, Place, and Person)  Thought Content:  perseverative about moving towards [facility] to live near to family  Suicidal Thoughts:  No  Homicidal Thoughts:  No  Memory:  Immediate;   Good Recent;   Good Remote;   Good  Judgement:  Other:  Limited  Insight:  Shallow  Psychomotor Activity:  Normal  Concentration:  Concentration: Good and Attention Span: Good  Recall:  [employer] of Knowledge:  Fair  Language:  Good  Akathisia:  No  Handed:  Right  AIMS (if indicated):   0  Assets:  Resilience Social Support  ADL's:  Intact  Cognition:  WNL  Sleep:   Adequate   Demographic  Factors:  Caucasian  Loss Factors: NA  Historical Factors: Impulsivity  Risk Reduction Factors:   Sense of responsibility to family, Living with another person, especially a relative, Positive social support and Positive therapeutic relationship  Continued Clinical Symptoms:  Anxiety, mild  Cognitive Features That Contribute To Risk:  None    Suicide Risk:  Minimal: No identifiable suicidal ideation.  Patients presenting with no risk factors but with morbid ruminations; may be classified as minimal risk based on the severity of the depressive symptoms   Plan Of Care/Follow-up recommendations:   Adjustment disorder with mixed disturbance of emotions and conduct: Schizoaffective disorder, depressive disorder: Continued Luvox 150 mg at bedtime for anxiety and depressive disorder Continued Risperdal 1 mg BID and 2 mg at bedtime  Anxiety: Klonopin 1 mg twice a day breakfast and dinner for anxiety  Asthma: -albuterol 2 puffs every hours Activity:  as tolerated Diet:  heart healthy diet   Other medical:  UTI Continued Cogentin 2 mg twice daily for EPS prevention, and to maximize anticholinergic effect for assistance in decreasing urinary incontinence. -Continued macrodantin 100 mg daily  Candidiasis:   -Ordered one time dose of fluconazole 150 mg   Pain: -continued ibuprofen 800 mg every 8 hours PRN -Continued flexeril 7.5 mg TID PRN   Disposition: Continue working with her ACT team/Social work for placement in another  group home setting, as patient is unlikely to be successful living independently.  Patient is agreeable at this time to staying in emergency department to receive assistance from social work on housing.  Nanine Means, NP 09/15/2018, 1:03 PM

## 2018-09-15 NOTE — ED Notes (Signed)
Pt is sitting in dayroom yelling out for staff. RN explained to patient this behavior was not acceptable and she would need to return to her room if it continues. Maintained on 15 minute checks and observation by security camera for safety.

## 2018-09-15 NOTE — ED Notes (Signed)
Hourly rounding reveals patient in room. No complaints, stable, in no acute distress. Q15 minute rounds and monitoring via Security Cameras to continue. 

## 2018-09-15 NOTE — ED Notes (Signed)
Pt. Complaining about being in BHU. Asking to go back to quad. Patient informed that she is assigned to [facility] per physicians order and that her loud voiced may keep other patients awake. Redirection to her room attempted with little effect.

## 2018-09-15 NOTE — Progress Notes (Signed)
Physical Therapy Treatment Patient Details Name: [name] MRN: [id number] DOB: [birth year] Today's Date: 09/15/2018    History of Present Illness presented to ER 4/24 secondary to back pain, suicidal ideation in the setting of not liking her group home/not wanting to go to homeless shelter (per notes).  Psych following; pending discharge to new group home when cleared.    PT Comments    Pt sitting in room upon arrival.  Staff stated pt is walking well with no AD on unit.  Pt reports having neck pain but no other complaints.  Agrees to gait.  Stood and ambulated several times on unit with HHA x 1 with rest breaks in room self initiated.  Upon sitting each time she stated she was not tired and would shortly stand back up and walk again.  She does report feeling unsteady at times and prefers to use a walker but does well with HHA.  Would recommend a walker upon discharge for overall safety and to encourage mobility at discharge destination.  She refuses exercises so session focuses on gait.  Attempted neck ROM and gentle stretching but she states "I can't."  When observed during gait, pt seem slightly limited with neck mobility but does not seem to impact mobility and can turn her head to talk to me without complaint.  Pt chooses to remain in sitting area at end of session.     Follow Up Recommendations  Home health PT     Equipment Recommendations  Rolling walker with 5" wheels    Recommendations for Other Services       Precautions / Restrictions Precautions Precautions: Fall Restrictions Weight Bearing Restrictions: No    Mobility  Bed Mobility Overal bed mobility: Modified Independent                Transfers Overall transfer level: Needs assistance Equipment used: None Transfers: Sit to/from Stand Sit to Stand: Min guard            Ambulation/Gait Ambulation/Gait assistance: Min assist Gait Distance (Feet): 450 Feet Assistive device: 1 person hand held  assist Gait Pattern/deviations: Step-through pattern;Decreased step length - left;Decreased step length - right;Antalgic;Trunk flexed Gait velocity: decreased   General Gait Details: 100' x 2, 200' x 1, 150' x 1 with HHA-   Stairs             Wheelchair Mobility    Modified Rankin (Stroke Patients Only)       Balance Overall balance assessment: Needs assistance Sitting-balance support: No upper extremity supported;Feet supported Sitting balance-Leahy Scale: Good     Standing balance support: Single extremity supported Standing balance-Leahy Scale: Fair Standing balance comment: feels more confident with HHA                            Cognition Arousal/Alertness: Awake/alert Behavior During Therapy: WFL for tasks assessed/performed Overall Cognitive Status: No family/caregiver present to determine baseline cognitive functioning                                        Exercises Other Exercises Other Exercises: refuses exercises so session focused on gait.    General Comments        Pertinent Vitals/Pain      Home Living  Prior Function            PT Goals (current goals can now be found in the care plan section) Progress towards PT goals: Progressing toward goals    Frequency    Min 2X/week      PT Plan Current plan remains appropriate    Co-evaluation              AM-PAC PT "6 Clicks" Mobility   Outcome Measure  Help needed turning from your back to your side while in a flat bed without using bedrails?: None Help needed moving from lying on your back to sitting on the side of a flat bed without using bedrails?: None Help needed moving to and from a bed to a chair (including a wheelchair)?: None Help needed standing up from a chair using your arms (e.g., wheelchair or bedside chair)?: A Little Help needed to walk in hospital room?: A Little Help needed climbing 3-5 steps with a  railing? : A Little 6 Click Score: 21    End of Session   Activity Tolerance: Patient tolerated treatment well Patient left: in chair Nurse Communication: Mobility status       Time: 1345-1405 PT Time Calculation (min) (ACUTE ONLY): 20 min  Charges:  $Gait Training: 8-22 mins                     Danielle Dess, PTA 09/15/18, 2:19 PM

## 2018-09-16 DIAGNOSIS — G809 Cerebral palsy, unspecified: Secondary | ICD-10-CM | POA: Diagnosis not present

## 2018-09-16 DIAGNOSIS — F4325 Adjustment disorder with mixed disturbance of emotions and conduct: Secondary | ICD-10-CM | POA: Diagnosis not present

## 2018-09-16 DIAGNOSIS — F70 Mild intellectual disabilities: Secondary | ICD-10-CM | POA: Diagnosis not present

## 2018-09-16 DIAGNOSIS — R4189 Other symptoms and signs involving cognitive functions and awareness: Secondary | ICD-10-CM | POA: Diagnosis not present

## 2018-09-16 DIAGNOSIS — F250 Schizoaffective disorder, bipolar type: Secondary | ICD-10-CM | POA: Diagnosis not present

## 2018-09-16 LAB — SARS CORONAVIRUS 2 BY RT PCR (HOSPITAL ORDER, PERFORMED IN ~~LOC~~ HOSPITAL LAB): SARS Coronavirus 2: NEGATIVE

## 2018-09-16 MED ORDER — LEVOTHYROXINE SODIUM 50 MCG PO TABS: 50.0000 ug | ORAL_TABLET | Freq: Every day | ORAL | 11 refills | Status: AC

## 2018-09-16 MED ORDER — LORAZEPAM 1 MG PO TABS: 1.0000 mg | ORAL_TABLET | Freq: Two times a day (BID) | ORAL | 5 refills | Status: AC | PRN

## 2018-09-16 MED ORDER — CYCLOBENZAPRINE HCL 7.5 MG PO TABS: 7.5000 mg | ORAL_TABLET | Freq: Three times a day (TID) | ORAL | 11 refills | Status: AC | PRN

## 2018-09-16 MED ORDER — PANTOPRAZOLE SODIUM 40 MG PO TBEC: 40.0000 mg | DELAYED_RELEASE_TABLET | Freq: Every day | ORAL | 11 refills | Status: AC

## 2018-09-16 MED ORDER — IBUPROFEN 800 MG PO TABS: 800.0000 mg | ORAL_TABLET | Freq: Three times a day (TID) | ORAL | 11 refills | Status: AC | PRN

## 2018-09-16 MED ORDER — LORAZEPAM 2 MG PO TABS
2.0000 mg | ORAL_TABLET | Freq: Once | ORAL | Status: AC
  Administered 2018-09-16: 2 mg via ORAL
  Filled 2018-09-16: qty 1

## 2018-09-16 MED ORDER — FAMOTIDINE 20 MG PO TABS: 20.0000 mg | ORAL_TABLET | Freq: Two times a day (BID) | ORAL | 11 refills | Status: AC

## 2018-09-16 MED ORDER — PALIPERIDONE PALMITATE ER 234 MG/1.5ML IM SUSY: 234.0000 mg | PREFILLED_SYRINGE | INTRAMUSCULAR | 11 refills | Status: AC

## 2018-09-16 MED ORDER — SUCRALFATE 1 G PO TABS: 1.0000 g | ORAL_TABLET | Freq: Three times a day (TID) | ORAL | 11 refills | Status: AC

## 2018-09-16 MED ORDER — NYSTATIN 100000 UNIT/GM EX POWD: Freq: Two times a day (BID) | CUTANEOUS | 11 refills | Status: AC

## 2018-09-16 MED ORDER — FLUVOXAMINE MALEATE 100 MG PO TABS: 150.0000 mg | ORAL_TABLET | Freq: Every day | ORAL | 11 refills | Status: AC

## 2018-09-16 MED ORDER — DIPHENHYDRAMINE HCL 50 MG PO CAPS: 50.0000 mg | ORAL_CAPSULE | Freq: Three times a day (TID) | ORAL | 11 refills | Status: AC | PRN

## 2018-09-16 MED ORDER — RISPERIDONE 1 MG PO TABS: 1.0000 mg | ORAL_TABLET | Freq: Two times a day (BID) | ORAL | 11 refills | Status: AC

## 2018-09-16 MED ORDER — NITROFURANTOIN MACROCRYSTAL 100 MG PO CAPS: 100.0000 mg | ORAL_CAPSULE | Freq: Every day | ORAL | 11 refills | Status: AC

## 2018-09-16 MED ORDER — CLONAZEPAM 1 MG PO TABS: 1.0000 mg | ORAL_TABLET | Freq: Two times a day (BID) | ORAL | 5 refills | Status: AC

## 2018-09-16 MED ORDER — ACETAMINOPHEN 500 MG PO TABS: 1000.0000 mg | ORAL_TABLET | Freq: Four times a day (QID) | ORAL | 11 refills | Status: AC | PRN

## 2018-09-16 MED ORDER — RISPERIDONE 2 MG PO TABS: 2.0000 mg | ORAL_TABLET | Freq: Every day | ORAL | 11 refills | Status: AC

## 2018-09-16 MED ORDER — DOCUSATE SODIUM 100 MG PO CAPS: 100.0000 mg | ORAL_CAPSULE | Freq: Every day | ORAL | 11 refills | Status: AC

## 2018-09-16 MED ORDER — BENZTROPINE MESYLATE 2 MG PO TABS: 2.0000 mg | ORAL_TABLET | Freq: Two times a day (BID) | ORAL | 11 refills | Status: AC

## 2018-09-16 MED ORDER — FENOFIBRATE 160 MG PO TABS: 160.0000 mg | ORAL_TABLET | Freq: Every day | ORAL | 11 refills | Status: AC

## 2018-09-16 NOTE — ED Notes (Signed)
Patient asked to speak with someone, Clinical research associate discussed with her that she can get the chaplain to come and talk with her. Patient stated "I dont want to talk with no chaplain"

## 2018-09-16 NOTE — ED Notes (Signed)
Hourly rounding reveals patient in room. No complaints, stable, in no acute distress. Q15 minute rounds and monitoring via Security Cameras to continue. 

## 2018-09-16 NOTE — ED Notes (Signed)
VOL/Pending Placement 

## 2018-09-16 NOTE — TOC Transition Note (Signed)
Transition of Care [facility]) - CM/SW Discharge Note   Patient Details  Name: [name] MRN: [id number] Date of Birth: [birth year]  [facility] CM/SW Contact:  [name], LCSW Phone Number: 09/16/2018, 11:56 AM   Clinical Narrative:    Patient will be admitted to [facility] group home in [place], [place]. Pt's CST worker, Romilda Joy is transporting patient.  Pt's care coordinator was notified, and she will notify [facility] group home that pt is on the way.  CSW will fax pt's chest x-ray, and COVID-19 test results to [facility] group home.   TOC CSW signing off.    Final next level of care: Group Home Barriers to Discharge: Barriers Resolved   Patient Goals and CMS Choice   CMS Medicare.gov Compare Post Acute Care list provided to:: Patient Choice offered to / list presented to : Patient  Discharge Placement                       Discharge Plan and Services                                     Social Determinants of Health (SDOH) Interventions     Readmission Risk Interventions No flowsheet data found.

## 2018-09-16 NOTE — ED Provider Notes (Signed)
-----------------------------------------   3:16 AM on 09/16/2018 -----------------------------------------   Blood pressure 139/87, pulse 78, temperature 97.7 F (36.5 C), temperature source Oral, resp. rate 18, last menstrual period 05/28/2018, SpO2 100 %.  The patient is calm and cooperative at this time.  There have been no acute events since the last update.  Awaiting disposition plan from Behavioral Medicine team.    Willy Eddy, MD 09/16/18 [phone]

## 2018-09-16 NOTE — ED Notes (Signed)
Hourly rounding reveals patient sleeping in room. No complaints, stable, in no acute distress. Q15 minute rounds and monitoring via Security Cameras to continue. 

## 2018-09-16 NOTE — TOC Progression Note (Signed)
Transition of Care [facility]) - Progression Note    Patient Details  Name: Tyonna Minetti MRN: [id number] Date of Birth: April 05, 1966  [facility] CM/SW Contact  Tania Bianney [place], LCSW Phone Number: 09/16/2018, 10:32 AM  Clinical Narrative:    9:32am- Pt's care coordinator, Larena Sox contacted CSW and shared that pt has authorization and can go to [facility] group home.   9:45am- CSW contacted Lola at [facility] group home to verify that pt will be able to be admitted to the group home today. Lola said that pt can come to the group home, and she wanted to know if pt would be given a 7 day supply of medications, and how pt would be transported to the group home.  Dr. Viviano Simas sent a 7 day supply of medication to [facility] for pt.   CSW is working on coordinating transportation for pt.           Expected Discharge Plan and Services                                                 Social Determinants of Health (SDOH) Interventions    Readmission Risk Interventions No flowsheet data found.

## 2018-09-16 NOTE — Progress Notes (Signed)
[facility]  09/16/2018 12:29 PM Tempie DonningKimberly Tagg  MRN:  [id number]  Principal Problem: Adjustment disorder with mixed disturbance of emotions and conduct Diagnosis: Principal Problem:   Adjustment disorder with mixed disturbance of emotions and conduct Active Problems:   Cerebral palsy (HCC)   Mild intellectual disability   GERD (gastroesophageal reflux disease)   COPD (chronic obstructive pulmonary disease) (HCC)   Schizoaffective disorder, bipolar type (HCC)   Suicidal ideation  Patient is seen, chart is reviewed, additional.  25 minutes spent in coronation of care with conference calls to caseworker, and group home, and pharmacy. Total Time spent with patient: 45 minutes  Patient seen today for psych f/u. Chart reviewed. Patient is calm, cooperative.  She expresses anxiety about moving to the group home.  HPI: On admission 08/08/18:  [name] is a 53 year old Caucasian female with a history ofschizoaffective disorder, intellectual disability and borderline personality disorder. She was seen in emergency department due to suicidal ideations, but once evaluated, she denied having them,and therefore she was discharged. Later on the same day, she came back expressing suicidal thoughts again, and racing thoughts.    SUBJECTIVE: "I was not myself yesterday.  Anxious about losing."  On evaluation, patient is calm and cooperative.  She is denying somatic complaints today.  Patient is denying suicidal and homicidal ideation.  She is denying hallucinations today. Patient reports adequate sleep and good appetite.   Contact with group home,  [facility]/ patient's caseworker, Morrie Sheldonshley, and patient's new outpatient pharmacy and coordinating travel and ongoing care.  Patient is picked up by member of her care treatment team, Florencia.  COVID testing has been completed and is negative at time of discharge.     Med WU:[id number]:asthma, cerebral palsy, COPD.  Past psych Hx:  Dx:  Schizoaffective d/o, BPD. Multiple past psych admissions.Hash/o past suicide attempts.  Current psych medications: Risperdal 1 mg with breakfast and lunch, and 2mg  QHS, Invega Sustenna 156 mg IM once on 08/28/18 with repeat dose every 28 days; Klonopin1mg  PO BID, Luvox 150mg  PO QHS, Benztropine 2mg  PO BID.  Social History:Patient is single. She livedin group home, and is awaiting placement to new group home. She is on disability. She has no children. She reports no access to guns.  Substance use: denies.   Past Medical History:  Past Medical History:  Diagnosis Date  . Anxiety   . Asthma   . Borderline personality disorder (HCC)   . Cerebral palsy (HCC)   . COPD (chronic obstructive pulmonary disease) (HCC)   . Depression   . GERD (gastroesophageal reflux disease)   . Mild cognitive impairment   . Schizoaffective disorder (HCC)   . Wound abscess     Past Surgical History:  Procedure Laterality Date  . CHOLECYSTECTOMY    . INCISION AND DRAINAGE ABSCESS Right 01/02/2015   Procedure: INCISION AND DRAINAGE ABSCESS;  Surgeon: Tiney Rougealph Ely III, MD;  Location: [facility] ORS;  Service: General;  Laterality: Right;  . TONSILLECTOMY     Family History:  Family History  Problem Relation Age of Onset  . Heart attack Father   . Diabetes Mother     Social History:  Social History   Substance and Sexual Activity  Alcohol Use No     Social History   Substance and Sexual Activity  Drug Use No    Social History   Socioeconomic History  . Marital status: Single    Spouse name: Not on file  . Number of children: Not on file  .  Years of education: Not on file  . Highest education level: Not on file  Occupational History  . Occupation: Disabled  Social Needs  . Financial resource strain: Not on file  . Food insecurity:    Worry: Not on file    Inability: Not on file  . Transportation needs:    Medical: Not on file    Non-medical: Not on file  Tobacco Use  . Smoking status:  Former Games developer  . Smokeless tobacco: Never Used  Substance and Sexual Activity  . Alcohol use: No  . Drug use: No  . Sexual activity: Not Currently  Lifestyle  . Physical activity:    Days per week: Not on file    Minutes per session: Not on file  . Stress: Not on file  Relationships  . Social connections:    Talks on phone: Not on file    Gets together: Not on file    Attends religious service: Not on file    Active member of club or organization: Not on file    Attends meetings of clubs or organizations: Not on file    Relationship status: Not on file  Other Topics Concern  . Not on file  Social History Narrative   Pt lives with mother, nephew, sister in law    Sleep: Fair  Appetite:  Good  Current Medications: Current Facility-Administered Medications  Medication Dose Route Frequency Provider Last Rate Last Dose  . acetaminophen (TYLENOL) tablet 650 mg  650 mg Oral Once Jeanmarie Plant, MD      . acetaminophen (TYLENOL) tablet 650 mg  650 mg Oral Q6H PRN Dionne Bucy, MD   650 mg at 09/14/18 2143  . acetaminophen (TYLENOL) tablet 650 mg  650 mg Oral Once Sharman Cheek, MD   Stopped at 08/23/18 1928  . benztropine (COGENTIN) tablet 2 mg  2 mg Oral BID Reggie Pile, MD   2 mg at 09/16/18 1015  . clonazePAM (KLONOPIN) tablet 1 mg  1 mg Oral BID Reggie Pile, MD   1 mg at 09/16/18 1015  . cyclobenzaprine (FLEXERIL) tablet 7.5 mg  7.5 mg Oral TID PRN Mariel Craft, MD   7.5 mg at 09/09/18 2118  . diphenhydrAMINE (BENADRYL) capsule 50 mg  50 mg Oral Q8H PRN Mariel Craft, MD   50 mg at 09/15/18 1421   Or  . diphenhydrAMINE (BENADRYL) injection 50 mg  50 mg Intramuscular Q8H PRN Mariel Craft, MD   50 mg at 08/24/18 2357  . docusate sodium (COLACE) capsule 100 mg  100 mg Oral Daily Mariel Craft, MD   100 mg at 09/16/18 1016  . famotidine (PEPCID) tablet 20 mg  20 mg Oral BID Reggie Pile, MD   20 mg at 09/16/18 1016  . fenofibrate tablet 160 mg  160 mg  Oral Daily Reggie Pile, MD   160 mg at 09/16/18 1016  . fluvoxaMINE (LUVOX) tablet 150 mg  150 mg Oral QHS Mariel Craft, MD   150 mg at 09/15/18 2205  . ibuprofen (ADVIL) tablet 800 mg  800 mg Oral Q8H PRN Mariel Craft, MD   800 mg at 09/13/18 0806  . levothyroxine (SYNTHROID) tablet 50 mcg  50 mcg Oral Q0600 Reggie Pile, MD   50 mcg at 09/16/18 0610  . LORazepam (ATIVAN) tablet 1 mg  1 mg Oral BID PRN Mariel Craft, MD   1 mg at 09/15/18 1421   Or  . LORazepam (ATIVAN) injection 2  mg  2 mg Intramuscular BID PRN Mariel Craft, MD      . nitrofurantoin (MACRODANTIN) capsule 100 mg  100 mg Oral Q2000 Mariel Craft, MD   100 mg at 09/15/18 2205  . nystatin (MYCOSTATIN/NYSTOP) topical powder   Topical BID Emily Filbert, MD      . paliperidone [facility] SUSTENNA) injection 156 mg  156 mg Intramuscular Q28 days Mariel Craft, MD   156 mg at 08/28/18 0509  . pantoprazole (PROTONIX) EC tablet 40 mg  40 mg Oral Daily Reggie Pile, MD   40 mg at 09/16/18 1019  . risperiDONE (RISPERDAL) tablet 1 mg  1 mg Oral BID WC Mariel Craft, MD   1 mg at 09/16/18 0900  . risperiDONE (RISPERDAL) tablet 2 mg  2 mg Oral QHS Mariel Craft, MD   2 mg at 09/15/18 2206  . sucralfate (CARAFATE) tablet 1 g  1 g Oral TID WC & HS Reggie Pile, MD   1 g at 09/16/18 0900   Current Outpatient Medications  Medication Sig Dispense Refill  . acetaminophen (TYLENOL) 500 MG tablet Take 2 tablets (1,000 mg total) by mouth every 6 (six) hours as needed for mild pain, moderate pain, fever or headache. 100 tablet 11  . benztropine (COGENTIN) 2 MG tablet Take 1 tablet (2 mg total) by mouth 2 (two) times daily. with lunch and at bedtime 60 tablet 11  . clonazePAM (KLONOPIN) 1 MG tablet Take 1 tablet (1 mg total) by mouth 2 (two) times daily. With breakfast and dinner 60 tablet 5  . cyclobenzaprine (FEXMID) 7.5 MG tablet Take 1 tablet (7.5 mg total) by mouth 3 (three) times daily as needed for muscle spasms. 30  tablet 11  . diphenhydrAMINE (BENADRYL) 50 MG capsule Take 1 capsule (50 mg total) by mouth every 8 (eight) hours as needed for itching, allergies or sleep (agitation). 100 capsule 11  . [START ON 09/17/2018] docusate sodium (COLACE) 100 MG capsule Take 1 capsule (100 mg total) by mouth daily. 30 capsule 11  . famotidine (PEPCID) 20 MG tablet Take 1 tablet (20 mg total) by mouth 2 (two) times daily. 60 tablet 11  . fenofibrate 160 MG tablet Take 1 tablet (160 mg total) by mouth daily. 30 tablet 11  . fluvoxaMINE (LUVOX) 100 MG tablet Take 1.5 tablets (150 mg total) by mouth at bedtime. 45 tablet 11  . ibuprofen (ADVIL) 800 MG tablet Take 1 tablet (800 mg total) by mouth every 8 (eight) hours as needed for fever, headache, moderate pain or cramping. 60 tablet 11  . levothyroxine (SYNTHROID) 50 MCG tablet Take 1 tablet (50 mcg total) by mouth daily before breakfast. 30 tablet 11  . LORazepam (ATIVAN) 1 MG tablet Take 1 tablet (1 mg total) by mouth 2 (two) times daily as needed for anxiety or sleep. 60 tablet 5  . nitrofurantoin (MACRODANTIN) 100 MG capsule Take 1 capsule (100 mg total) by mouth daily at 8 pm. 30 capsule 11  . nystatin (MYCOSTATIN/NYSTOP) powder Apply topically 2 (two) times daily. Under breasts 15 g 11  . [START ON 09/25/2018] paliperidone (INVEGA SUSTENNA) 234 MG/1.5ML SUSY injection Inject 234 mg into the muscle every 28 (twenty-eight) days. 1.5 mL 11  . pantoprazole (PROTONIX) 40 MG tablet Take 1 tablet (40 mg total) by mouth daily. 30 tablet 11  . risperiDONE (RISPERDAL) 1 MG tablet Take 1 tablet (1 mg total) by mouth 2 (two) times daily with a meal. Breakfast and Lunch 60  tablet 11  . risperiDONE (RISPERDAL) 2 MG tablet Take 1 tablet (2 mg total) by mouth at bedtime. 30 tablet 11  . sucralfate (CARAFATE) 1 g tablet Take 1 tablet (1 g total) by mouth 4 (four) times daily -  with meals and at bedtime. 90 tablet 11    Lab Results: No results found for this or any previous visit (from  the past 48 hour(s)).  Blood Alcohol level:  Lab Results  Component Value Date   ETH <10 08/09/2018   ETH <10 08/01/2018    Metabolic Disorder Labs: Lab Results  Component Value Date   HGBA1C 4.8 08/11/2016   MPG 91 08/11/2016   Lab Results  Component Value Date   PROLACTIN 111.7 (H) 08/11/2016   PROLACTIN 66.8 (H) 06/17/2015   Lab Results  Component Value Date   CHOL 178 08/11/2016   TRIG 402 (H) 08/11/2016   HDL 13 (L) 08/11/2016   CHOLHDL 13.7 08/11/2016   VLDL UNABLE TO CALCULATE IF TRIGLYCERIDE OVER 400 mg/dL 96/07/5407   LDLCALC UNABLE TO CALCULATE IF TRIGLYCERIDE OVER 400 mg/dL 81/19/1478   LDLCALC 82 06/17/2015   Psychiatric Specialty Exam: Physical Exam  Nursing note and vitals reviewed. Constitutional: She is oriented to person, place, and time. She appears well-developed and well-nourished. No distress.  HENT:  Head: Normocephalic and atraumatic.  Eyes: EOM are normal.  Neck: Normal range of motion.  Cardiovascular: Normal rate and regular rhythm.  Respiratory: Effort normal. No respiratory distress.  Musculoskeletal: Normal range of motion.  Neurological: She is alert and oriented to person, place, and time.    Review of Systems  Constitutional: Negative.   Respiratory: Negative.   Cardiovascular: Negative.   Gastrointestinal: Negative.   Genitourinary: Positive for frequency.       Incontinence  Musculoskeletal: Negative.   Neurological: Negative.   Psychiatric/Behavioral: Negative for depression, hallucinations, memory loss, substance abuse and suicidal ideas. The patient is not nervous/anxious and does not have insomnia.     Blood pressure 126/78, pulse 75, temperature 98.5 F (36.9 C), temperature source Oral, resp. rate 16, last menstrual period 05/28/2018, SpO2 100 %.There is no height or weight on file to calculate BMI.  General Appearance: Casual  Eye Contact:  Good  Speech:  Clear and Coherent and Normal Rate  Volume:  Normal  Mood:   Anxious and Euthymic  Affect:  Congruent and Constricted  Thought Process:  Goal Directed  Orientation:  Full (Time, Place, and Person)  Thought Content:  Logical, Hallucinations: None and Rumination  Suicidal Thoughts:  No  Homicidal Thoughts:  No  Memory:  Recent;   Fair Remote;   Fair  Judgement:  Fair  Insight:  Fair  Psychomotor Activity:  Normal  Concentration:  Concentration: Fair and Attention Span: Fair  Recall:  [employer] of Knowledge:  Fair  Language:  Good  Akathisia:  No  Handed:  Right  AIMS (if indicated):     Assets:  Desire for Improvement Financial Resources/Insurance  ADL's:  Intact  Cognition:  WNL  Sleep:   adequate    Treatment Plan Summary:  MsShumateis a 53 y.o.femalewith a past psych h/o Schizoaffective disorder, borderline personality disorder, who was brought to ED from group home due to suicidal ideations. The patient iswell known to theED and oftenpresents with similar presentation -SI and HI in settings of not liking her group home.  Today patient appears to be without acute changes. She has not had somatic complaints today, and has exhibited good frustration  tolerance over the past week due to delays and paperwork processing. For muscle pain, patient has ibuprofen 800 mg every 8 hours as needed for pain, fever, and Flexeril 7.5 mg 3 times daily for muscle spasm. PT consult is ordered to ensure that patient can be independent for discharge to group home facility.  There are no indications for psych medication changes at this time.Current psych medications: Risperdal 2mg  QHS, Invega Sustenna 156 mg IM once on 08/28/18; Klonopin1mg  PO BID, Luvox 150mg  PO QHS, Benztropine 2mg  PO BID. Patient has as needed medication for anxiety, which appears to be increasing as she awaits her group home placement.  Patient is provided with extra dose of Ativan for travel.  She was able to engage in safety planning including plan to return to nearest  emergency room or contact emergency services if she feels unable to maintain her own safety or the safety of others. Patient had no further questions, comments, or concerns.  Discharge into care of care team member, who agrees to maintain patient safety.    Mariel Craft, MD 09/16/2018, 12:29 PM

## 2018-09-16 NOTE — ED Notes (Addendum)
Patient discharged to group home in [place], patient went with CST worker Flo, they received discharge papers and prescriptions were called to [facility]. Patient received belongings and verbalized she has received all of her belongings. Patient appropriate and cooperative, Denies SI/HI AVH. Vital signs taken. NAD noted.

## 2018-09-17 ENCOUNTER — Telehealth: Payer: Self-pay | Admitting: Family Medicine

## 2019-08-02 IMAGING — DX DG CHEST 2V
2 series · 2 of 2 positions shown · non-contrast
Comparison: 04/25/2017

CLINICAL DATA: Cough and chest congestion over the last 2 weeks.

EXAM:
CHEST  2 VIEW

[chest lat]
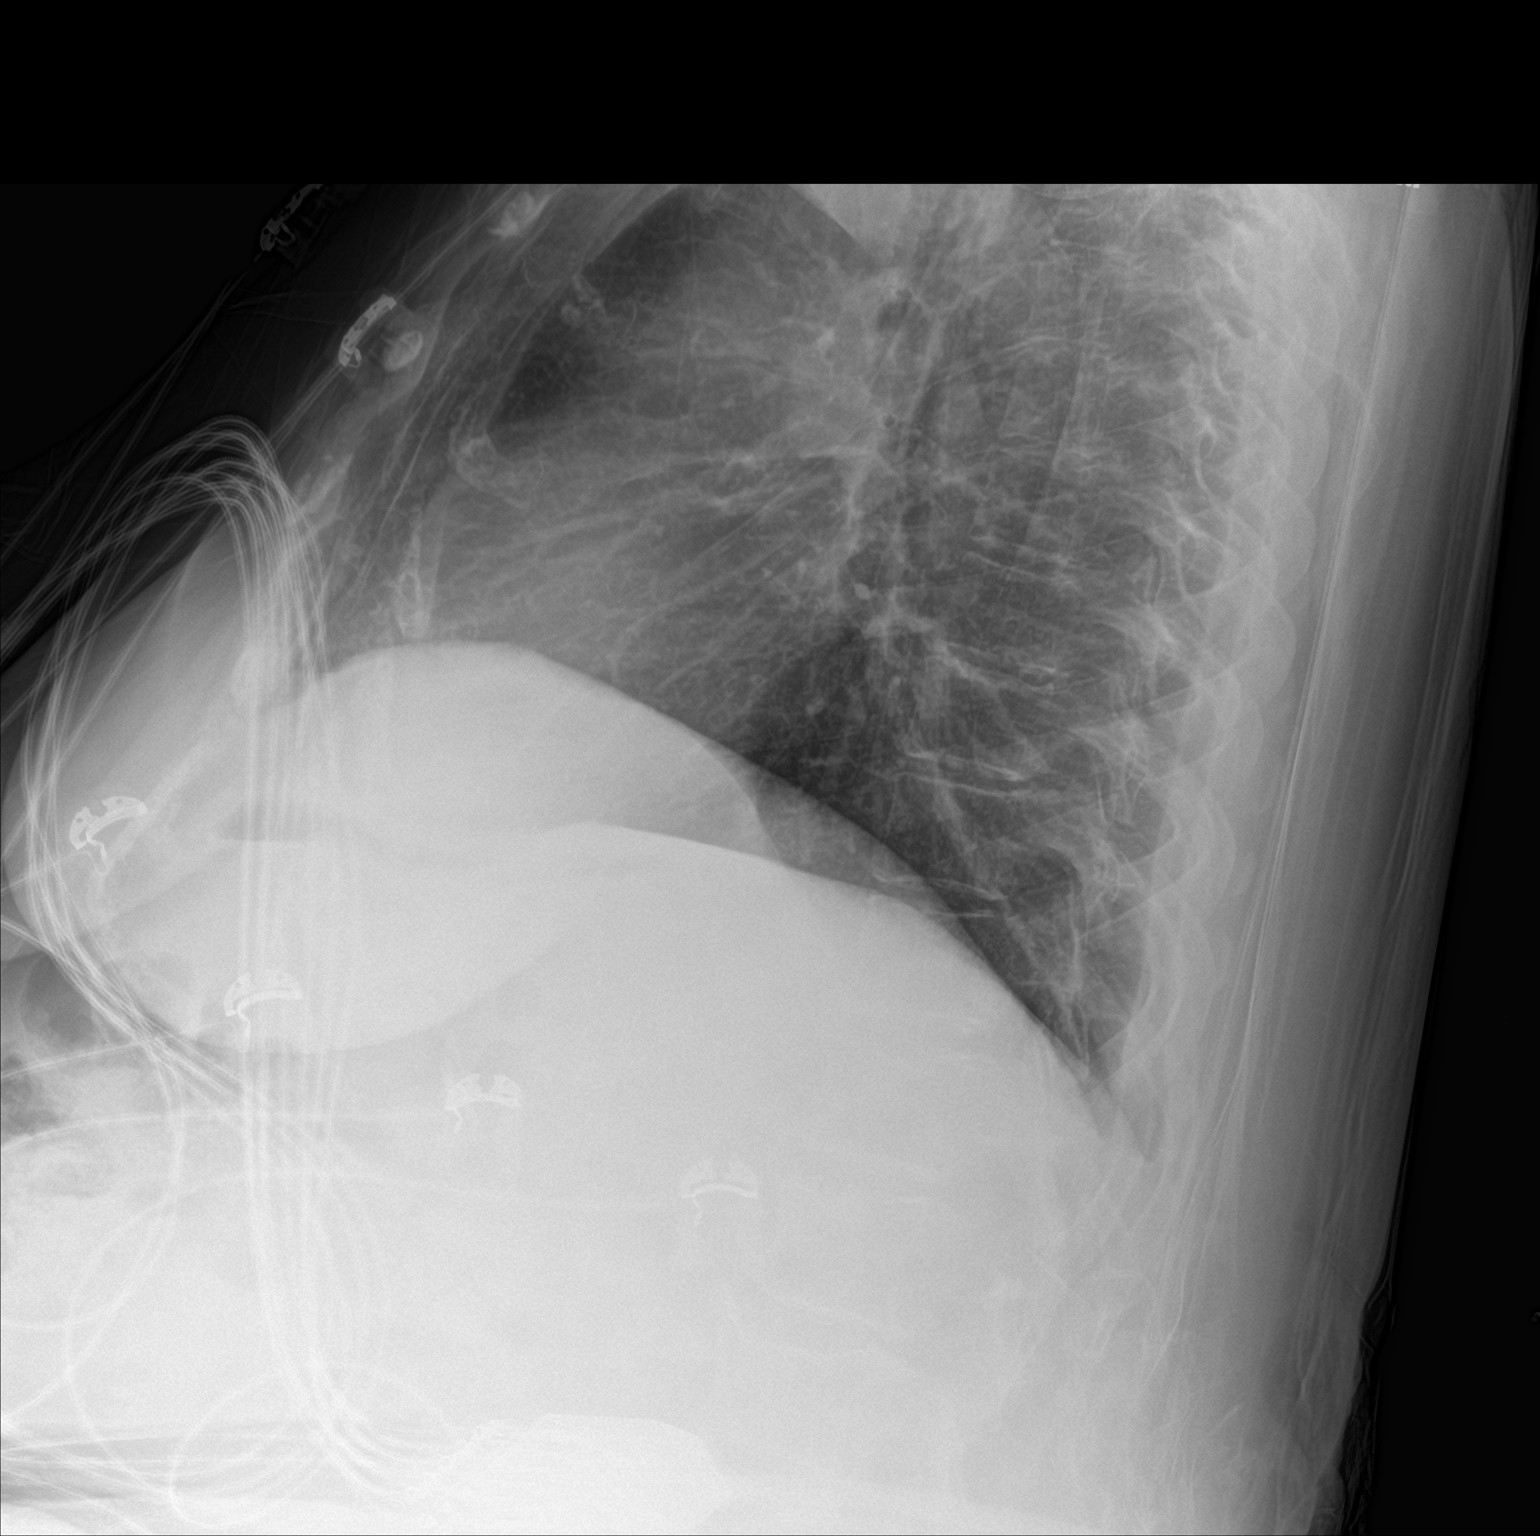

[chest ap]
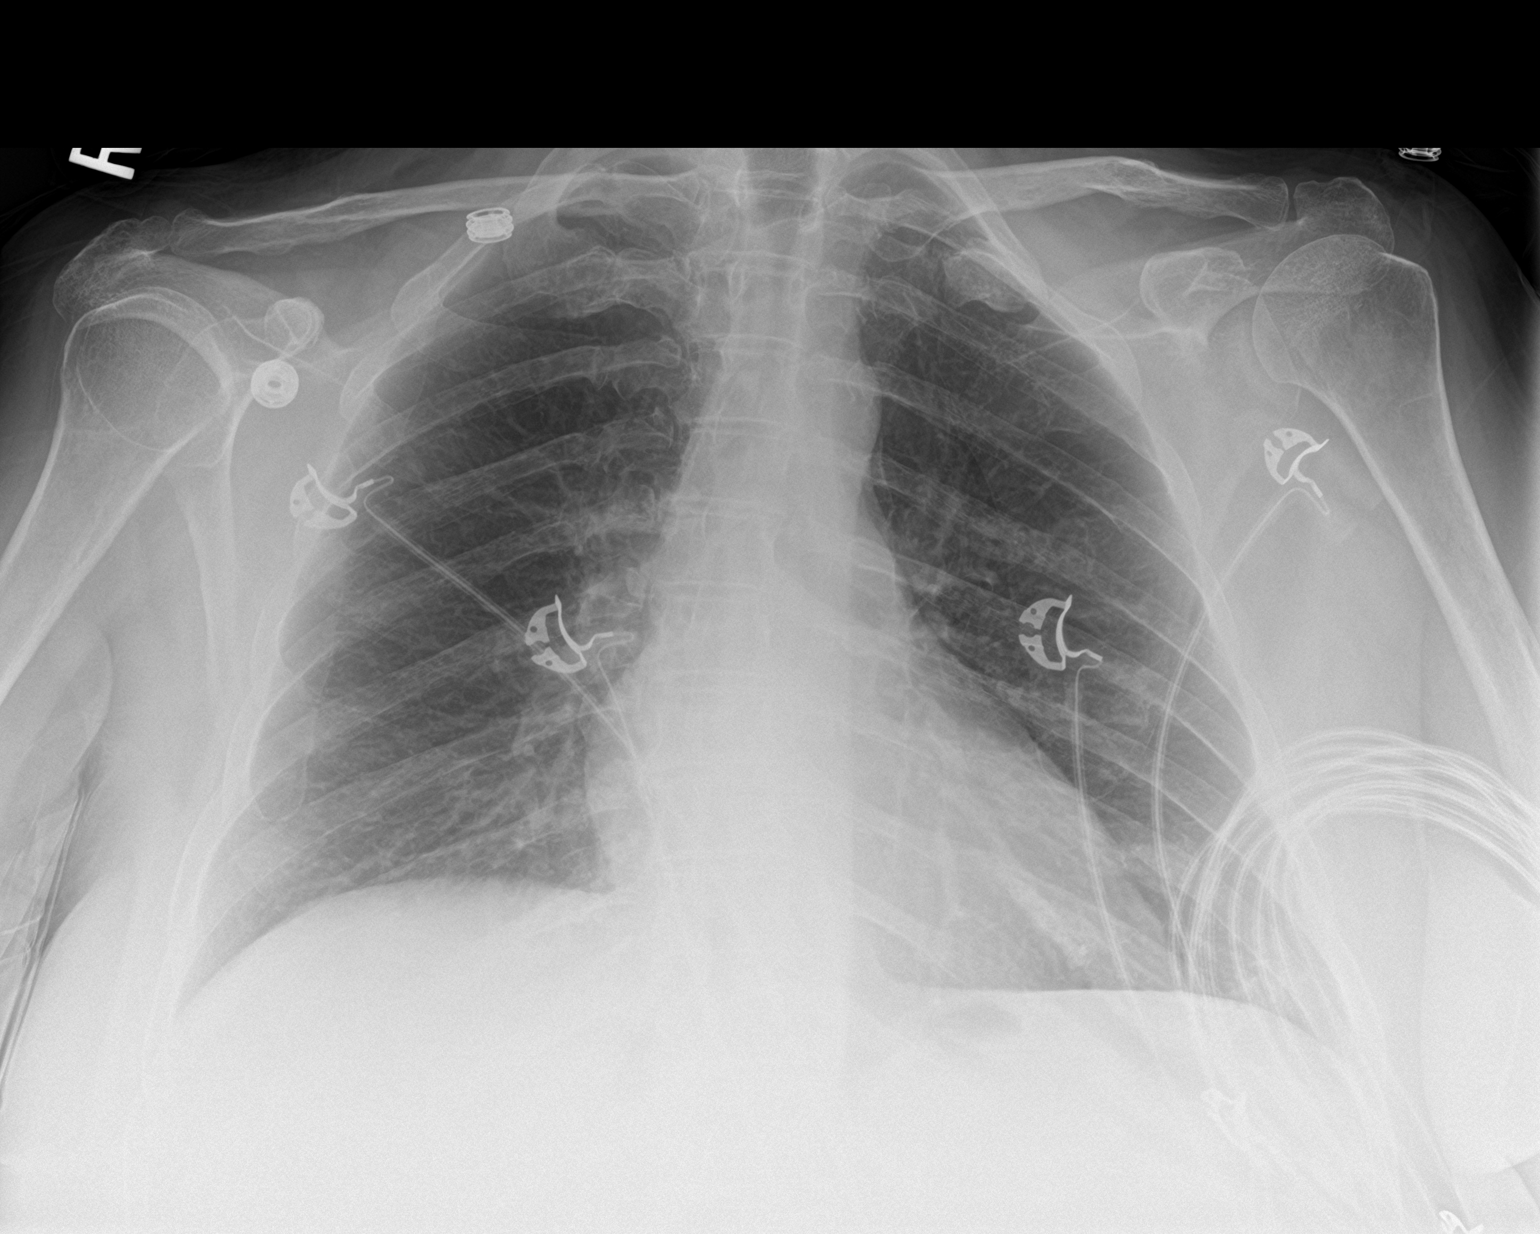

[2 of 2 positions shown; findings below may reference images not displayed]

FINDINGS: Heart size is normal. Mediastinal shadows are normal. The lungs are
clear. No bronchial thickening. No infiltrate, mass, effusion or
collapse. Pulmonary vascularity is normal. Mild spinal curvature.
IMPRESSION: Normal chest

## 2020-05-22 IMAGING — CR DG KNEE COMPLETE 4+V*R*
1 series · 4 of 4 positions shown · non-contrast
Comparison: None.

CLINICAL DATA: RIGHT knee pain for a week, no known injury.

EXAM:
RIGHT KNEE - COMPLETE 4+ VIEW

[Series 1: dg knee complete 4 views right · 0.14mm/px · 4 of 4 slices shown]
[im 1/4]
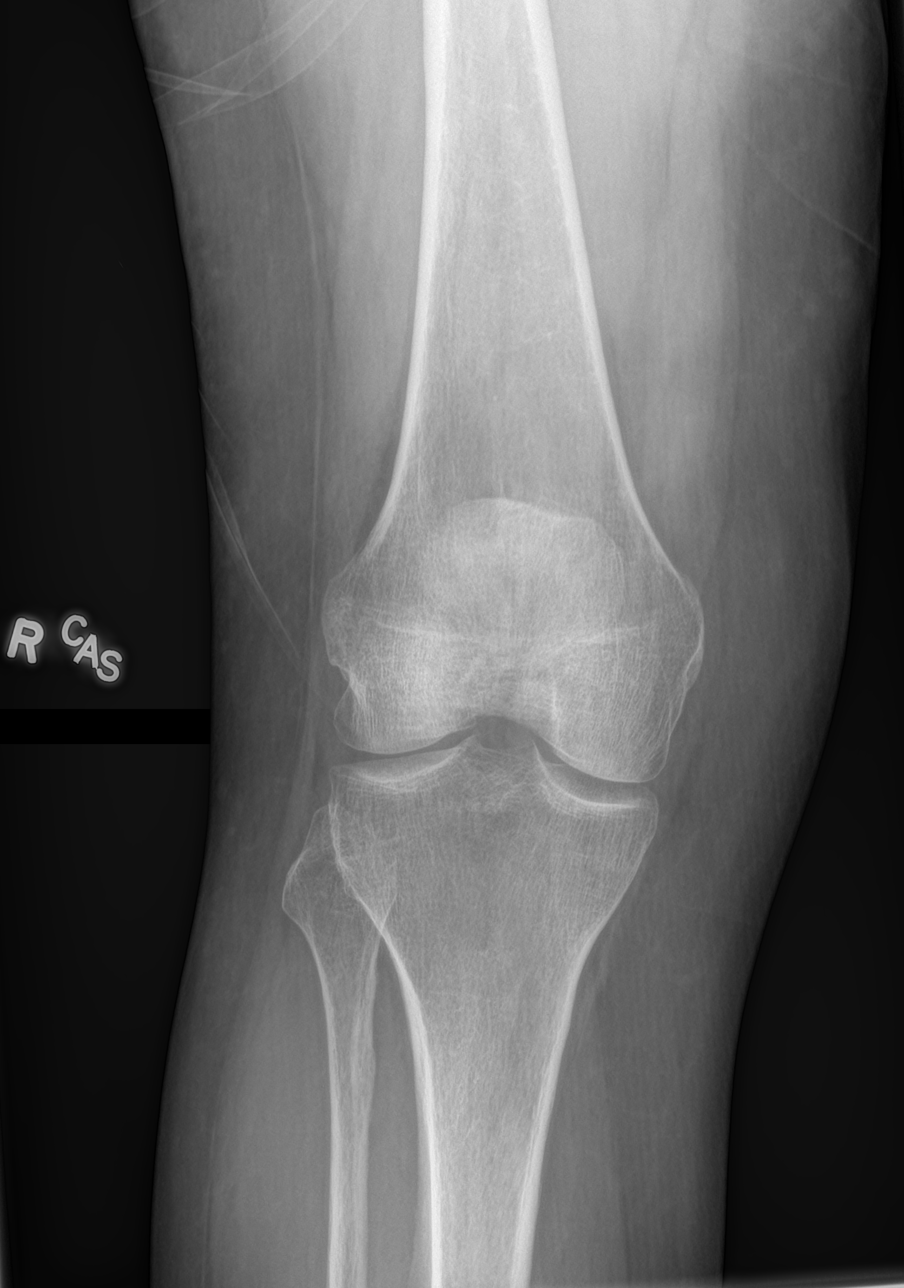
[im 2/4]
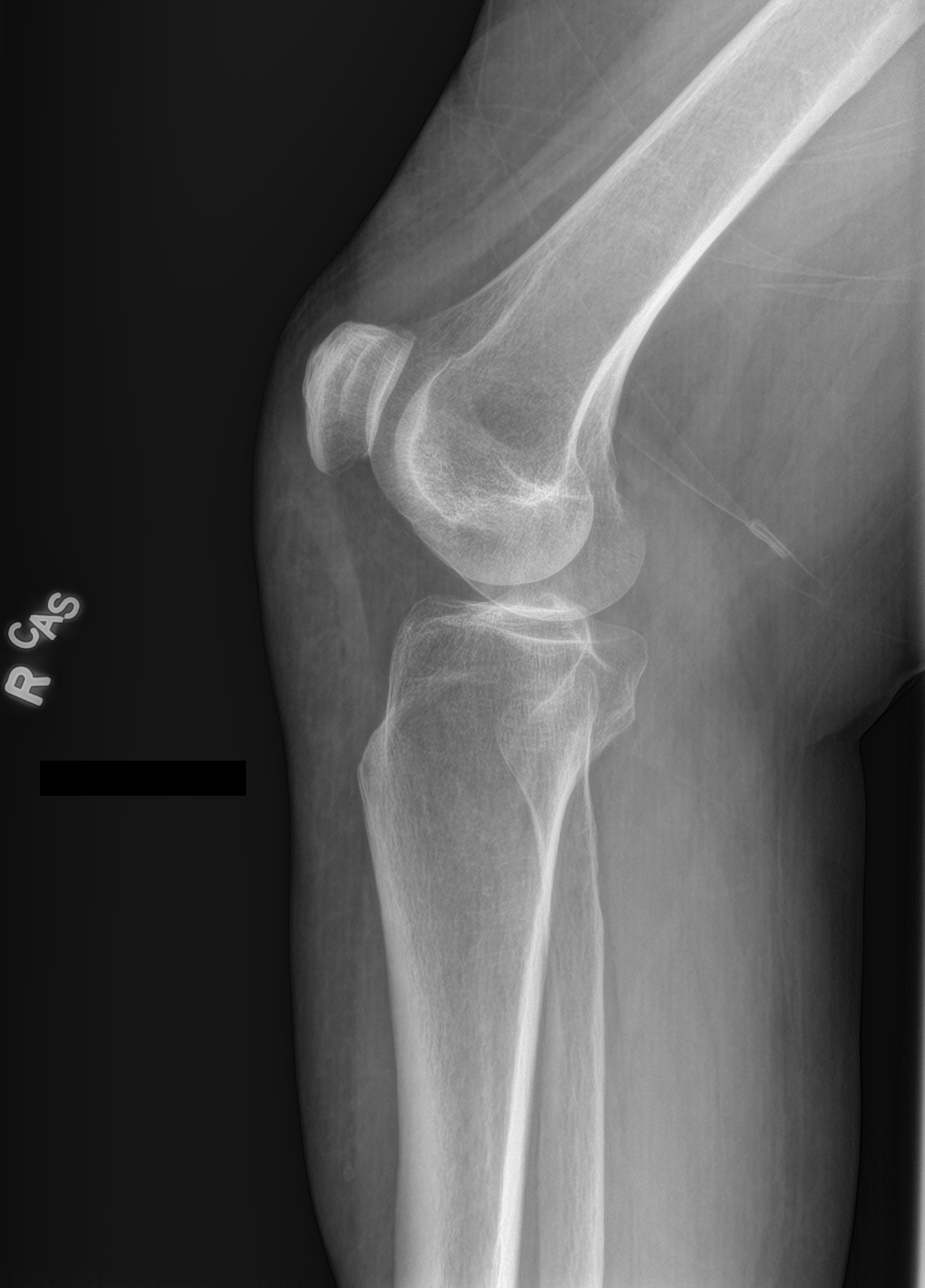
[im 3/4]
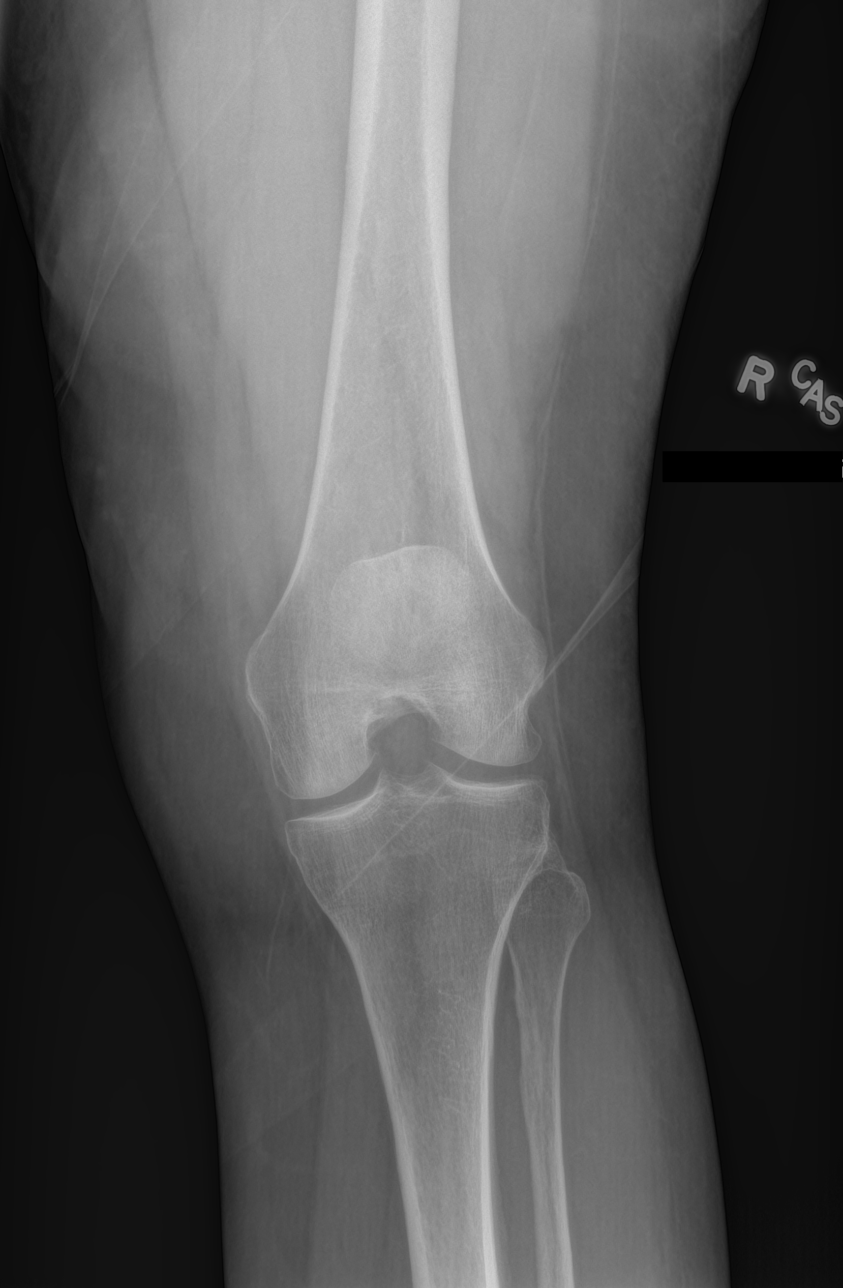
[im 4/4]
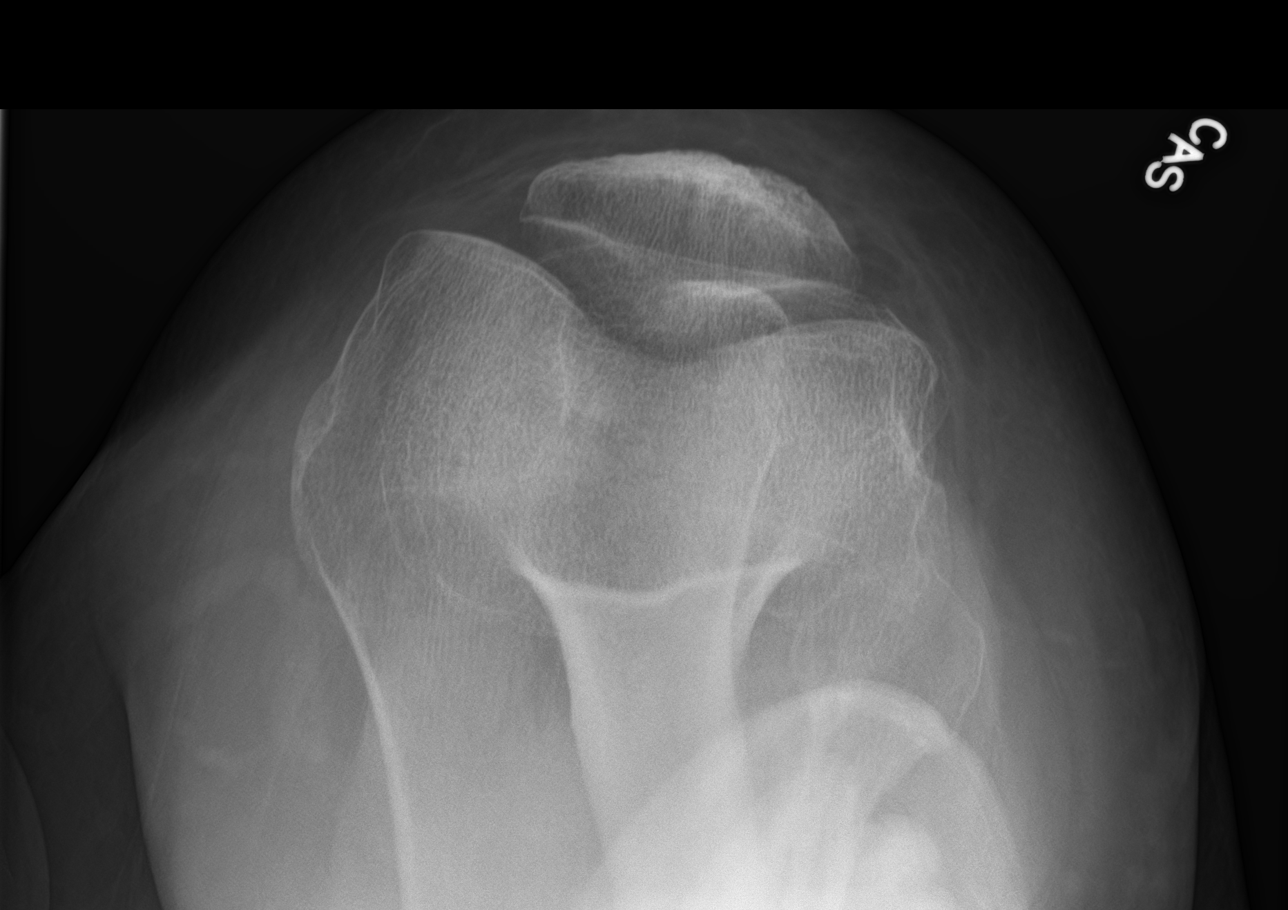

[4 of 4 positions shown; findings below may reference images not displayed]

FINDINGS: No evidence of fracture, dislocation, or joint effusion. No evidence
of arthropathy or other focal bone abnormality. Soft tissues are
unremarkable.
IMPRESSION: Negative.

## 2020-09-02 IMAGING — DX DG CHEST 2V
2 series · 2 of 2 positions shown · non-contrast
Comparison: 05/02/17

CLINICAL DATA: Chronic shortness of breath and chest pain for 1
day, initial encounter

EXAM:
CHEST - 2 VIEW

[chest pa]
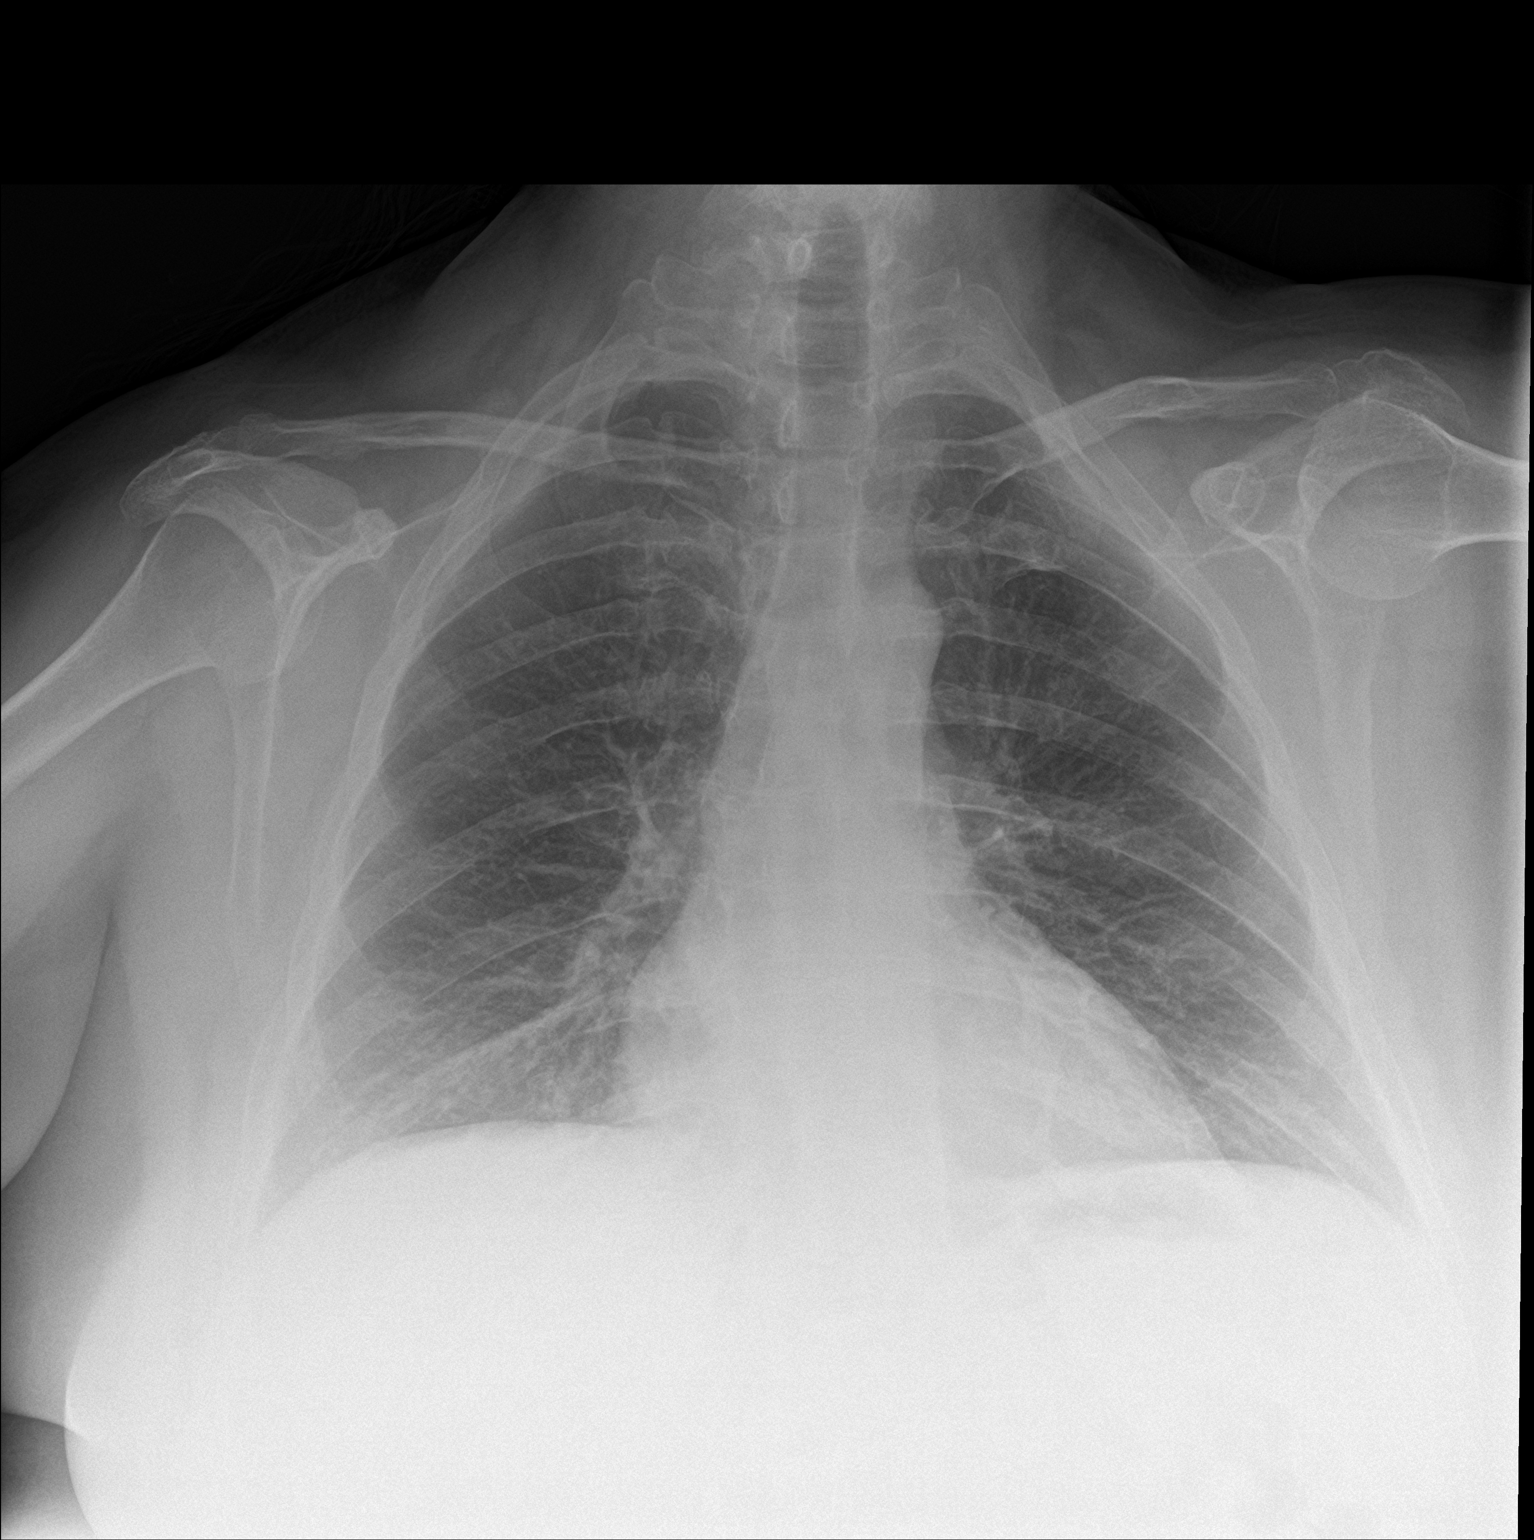

[chest lat]
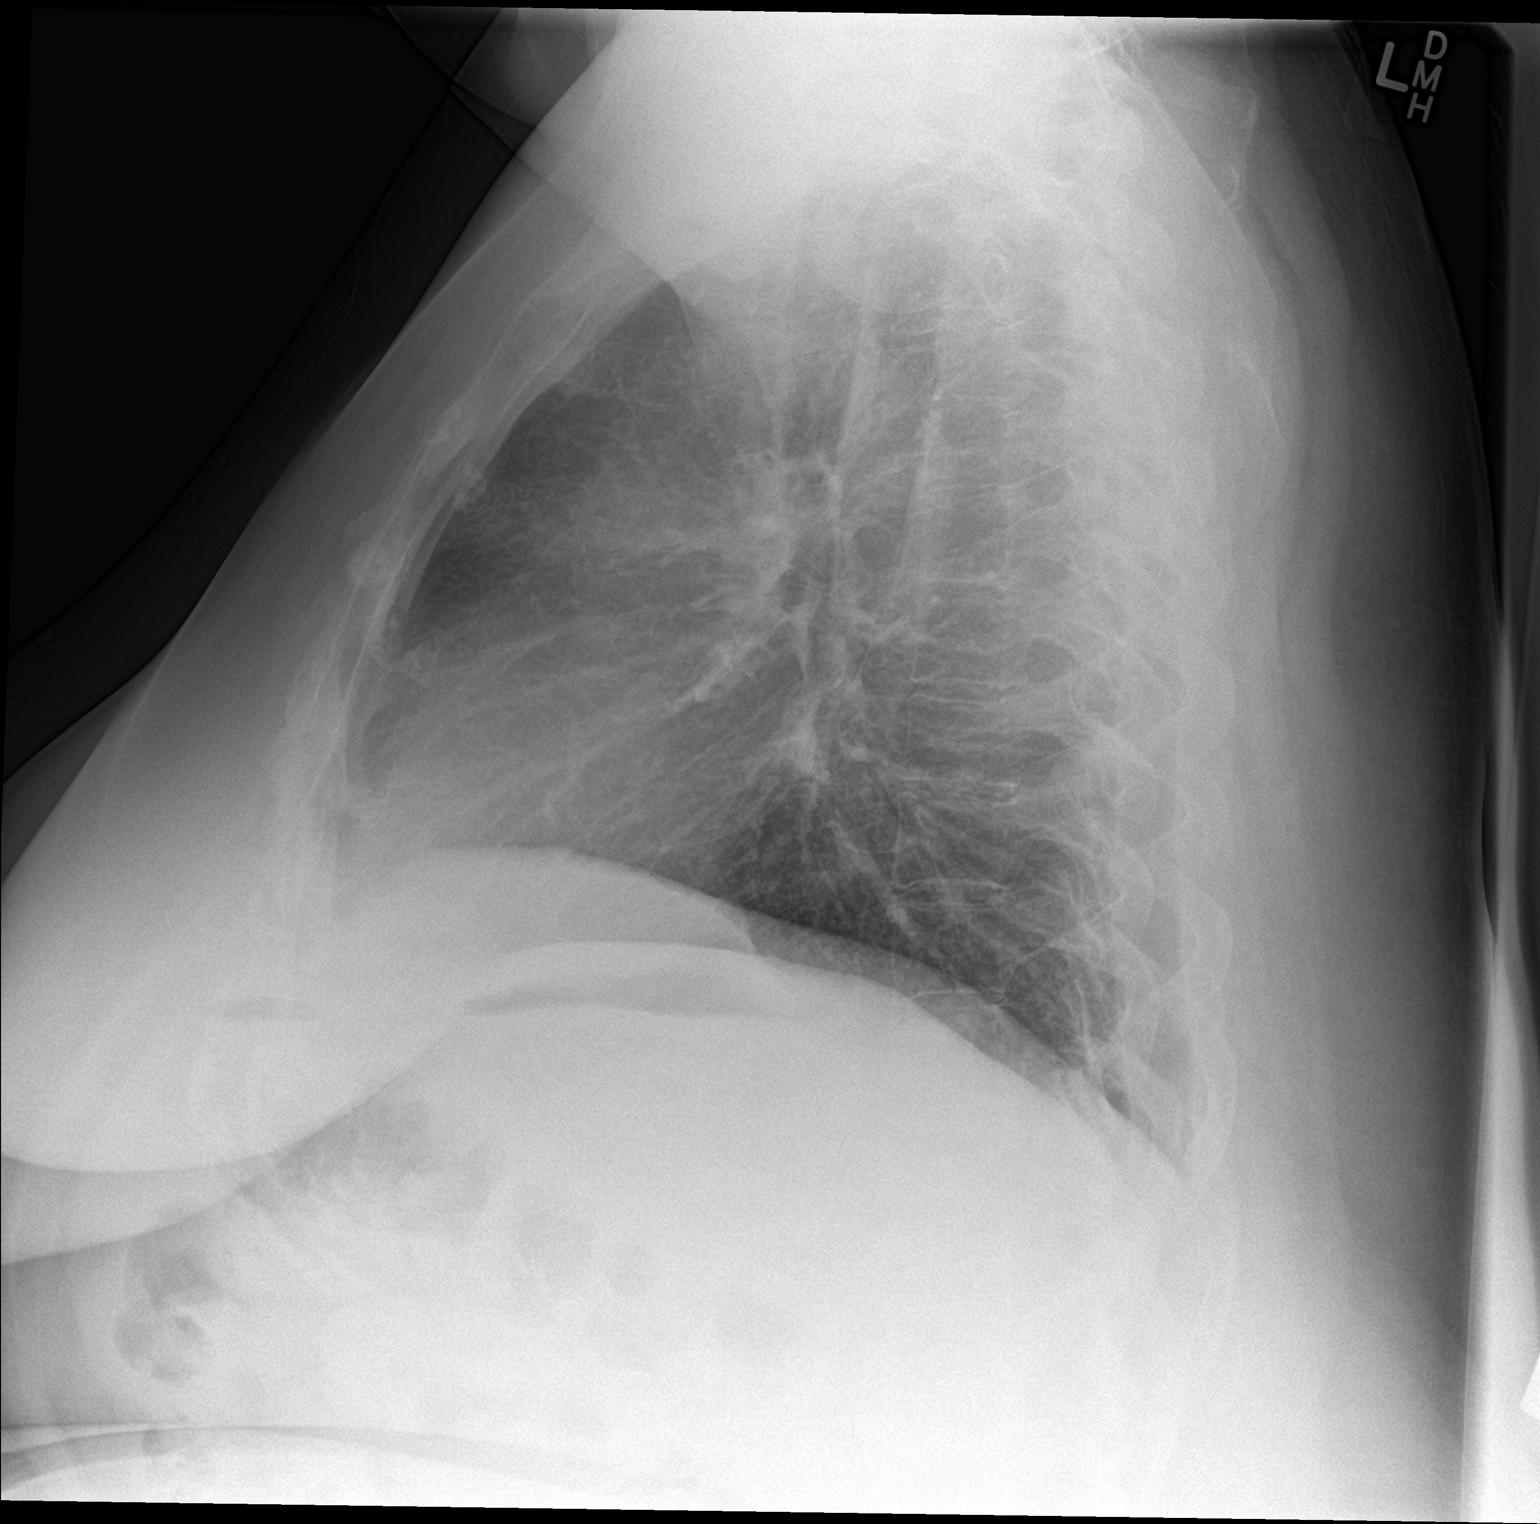

[2 of 2 positions shown; findings below may reference images not displayed]

FINDINGS: Cardiac shadows within normal limits. The lungs are well aerated
bilaterally. No focal infiltrate or sizable effusion is seen. No
acute bony abnormality is noted.
IMPRESSION: No active cardiopulmonary disease.
